# Patient Record
Sex: Female | Born: 1965
Health system: Southern US, Community
[De-identification: ages and names within clinical notes are randomized; demographics above are authoritative.]

## PROBLEM LIST (undated history)

## (undated) ENCOUNTER — Emergency Department (HOSPITAL_COMMUNITY): Payer: Medicaid Other | Source: Home / Self Care

## (undated) DIAGNOSIS — O2230 Deep phlebothrombosis in pregnancy, unspecified trimester: Secondary | ICD-10-CM

## (undated) DIAGNOSIS — G4730 Sleep apnea, unspecified: Secondary | ICD-10-CM

## (undated) DIAGNOSIS — I739 Peripheral vascular disease, unspecified: Secondary | ICD-10-CM

## (undated) DIAGNOSIS — R0600 Dyspnea, unspecified: Secondary | ICD-10-CM

---

## 2016-02-29 ENCOUNTER — Emergency Department (HOSPITAL_COMMUNITY): Payer: Medicaid Other

## 2016-02-29 ENCOUNTER — Inpatient Hospital Stay (HOSPITAL_COMMUNITY)
Admission: EM | Admit: 2016-02-29 | Discharge: 2016-03-08 | DRG: 175 | Disposition: A | Discharge status: Home / Self Care | Payer: Medicaid Other | Attending: Internal Medicine | Admitting: Internal Medicine

## 2016-02-29 ENCOUNTER — Encounter (HOSPITAL_COMMUNITY): Payer: Self-pay | Admitting: *Deleted

## 2016-02-29 DIAGNOSIS — R778 Other specified abnormalities of plasma proteins: Secondary | ICD-10-CM | POA: Diagnosis present

## 2016-02-29 DIAGNOSIS — I2699 Other pulmonary embolism without acute cor pulmonale: Principal | ICD-10-CM | POA: Diagnosis present

## 2016-02-29 DIAGNOSIS — I82401 Acute embolism and thrombosis of unspecified deep veins of right lower extremity: Secondary | ICD-10-CM | POA: Diagnosis present

## 2016-02-29 DIAGNOSIS — Z87891 Personal history of nicotine dependence: Secondary | ICD-10-CM

## 2016-02-29 DIAGNOSIS — D6859 Other primary thrombophilia: Secondary | ICD-10-CM | POA: Diagnosis present

## 2016-02-29 DIAGNOSIS — I824Z1 Acute embolism and thrombosis of unspecified deep veins of right distal lower extremity: Secondary | ICD-10-CM | POA: Diagnosis not present

## 2016-02-29 DIAGNOSIS — J9601 Acute respiratory failure with hypoxia: Secondary | ICD-10-CM | POA: Diagnosis present

## 2016-02-29 DIAGNOSIS — Z6841 Body Mass Index (BMI) 40.0 and over, adult: Secondary | ICD-10-CM | POA: Diagnosis not present

## 2016-02-29 DIAGNOSIS — R0600 Dyspnea, unspecified: Secondary | ICD-10-CM | POA: Diagnosis not present

## 2016-02-29 DIAGNOSIS — I82411 Acute embolism and thrombosis of right femoral vein: Secondary | ICD-10-CM | POA: Diagnosis not present

## 2016-02-29 HISTORY — DX: Morbid (severe) obesity due to excess calories: E66.01

## 2016-02-29 HISTORY — DX: Other pulmonary embolism without acute cor pulmonale: I26.99

## 2016-02-29 LAB — CBC WITH DIFFERENTIAL/PLATELET
BASOS ABS: 0.1 10*3/uL (ref 0.0–0.1)
BASOS PCT: 1 %
EOS ABS: 0.2 10*3/uL (ref 0.0–0.7)
Eosinophils Relative: 3 %
HEMATOCRIT: 46.2 % — AB (ref 36.0–46.0)
HEMOGLOBIN: 14.1 g/dL (ref 12.0–15.0)
Lymphocytes Relative: 37 %
Lymphs Abs: 2.8 10*3/uL (ref 0.7–4.0)
MCH: 27.2 pg (ref 26.0–34.0)
MCHC: 30.5 g/dL (ref 30.0–36.0)
MCV: 89 fL (ref 78.0–100.0)
MONO ABS: 0.7 10*3/uL (ref 0.1–1.0)
Monocytes Relative: 9 %
NEUTROS ABS: 4 10*3/uL (ref 1.7–7.7)
NEUTROS PCT: 50 %
Platelets: 222 10*3/uL (ref 150–400)
RBC: 5.19 MIL/uL — ABNORMAL HIGH (ref 3.87–5.11)
RDW: 15.5 % (ref 11.5–15.5)
WBC: 7.8 10*3/uL (ref 4.0–10.5)

## 2016-02-29 LAB — CBC
HEMATOCRIT: 43.2 % (ref 36.0–46.0)
Hemoglobin: 13.5 g/dL (ref 12.0–15.0)
MCH: 27.5 pg (ref 26.0–34.0)
MCHC: 31.3 g/dL (ref 30.0–36.0)
MCV: 88 fL (ref 78.0–100.0)
PLATELETS: 199 10*3/uL (ref 150–400)
RBC: 4.91 MIL/uL (ref 3.87–5.11)
RDW: 15.3 % (ref 11.5–15.5)
WBC: 7.4 10*3/uL (ref 4.0–10.5)

## 2016-02-29 LAB — I-STAT TROPONIN, ED: TROPONIN I, POC: 0.19 ng/mL — AB (ref 0.00–0.08)

## 2016-02-29 LAB — BASIC METABOLIC PANEL
ANION GAP: 9 (ref 5–15)
BUN: 15 mg/dL (ref 6–20)
CALCIUM: 9.3 mg/dL (ref 8.9–10.3)
CO2: 24 mmol/L (ref 22–32)
Chloride: 105 mmol/L (ref 101–111)
Creatinine, Ser: 1.17 mg/dL — ABNORMAL HIGH (ref 0.44–1.00)
GFR calc non Af Amer: 53 mL/min — ABNORMAL LOW (ref >60)
Glucose, Bld: 97 mg/dL (ref 65–99)
Potassium: 3.8 mmol/L (ref 3.5–5.1)
SODIUM: 138 mmol/L (ref 135–145)

## 2016-02-29 LAB — BRAIN NATRIURETIC PEPTIDE: B NATRIURETIC PEPTIDE 5: 566 pg/mL — AB (ref 0.0–100.0)

## 2016-02-29 LAB — HEPARIN LEVEL (UNFRACTIONATED): HEPARIN UNFRACTIONATED: 0.95 [IU]/mL — AB (ref 0.30–0.70)

## 2016-02-29 LAB — MRSA PCR SCREENING: MRSA BY PCR: NEGATIVE

## 2016-02-29 LAB — D-DIMER, QUANTITATIVE (NOT AT ARMC)

## 2016-02-29 MED ORDER — HEPARIN BOLUS VIA INFUSION
2000.0000 [IU] | Freq: Once | INTRAVENOUS | Status: AC
  Administered 2016-02-29: 2000 [IU] via INTRAVENOUS
  Filled 2016-02-29: qty 2000

## 2016-02-29 MED ORDER — METHYLPREDNISOLONE SODIUM SUCC 125 MG IJ SOLR
125.0000 mg | Freq: Once | INTRAMUSCULAR | Status: AC
  Administered 2016-02-29: 125 mg via INTRAVENOUS
  Filled 2016-02-29: qty 2

## 2016-02-29 MED ORDER — HEPARIN (PORCINE) IN NACL 100-0.45 UNIT/ML-% IJ SOLN: 1000.0000 [IU]/h | INTRAMUSCULAR | Status: DC

## 2016-02-29 MED ORDER — IOPAMIDOL (ISOVUE-370) INJECTION 76%
INTRAVENOUS | Status: AC
  Administered 2016-02-29: 100 mL
  Filled 2016-02-29: qty 100

## 2016-02-29 MED ORDER — HEPARIN SODIUM (PORCINE) 5000 UNIT/ML IJ SOLN
4000.0000 [IU] | Freq: Once | INTRAMUSCULAR | Status: AC
  Administered 2016-02-29: 4000 [IU] via INTRAVENOUS
  Filled 2016-02-29: qty 1

## 2016-02-29 MED ORDER — IPRATROPIUM-ALBUTEROL 0.5-2.5 (3) MG/3ML IN SOLN
3.0000 mL | Freq: Once | RESPIRATORY_TRACT | Status: AC
  Administered 2016-02-29: 3 mL via RESPIRATORY_TRACT
  Filled 2016-02-29: qty 3

## 2016-02-29 MED ORDER — SODIUM CHLORIDE 0.9 % IV SOLN: 250.0000 mL | INTRAVENOUS | Status: DC | PRN

## 2016-02-29 MED ORDER — HEPARIN (PORCINE) IN NACL 100-0.45 UNIT/ML-% IJ SOLN
2050.0000 [IU]/h | INTRAMUSCULAR | Status: DC
  Administered 2016-02-29: 2050 [IU]/h via INTRAVENOUS
  Filled 2016-02-29 (×2): qty 250

## 2016-02-29 MED ORDER — ACETAMINOPHEN 325 MG PO TABS: 650.0000 mg | ORAL_TABLET | ORAL | Status: DC | PRN

## 2016-02-29 MED ORDER — HEPARIN (PORCINE) IN NACL 100-0.45 UNIT/ML-% IJ SOLN
1850.0000 [IU]/h | INTRAMUSCULAR | Status: DC
  Administered 2016-03-01: 1900 [IU]/h via INTRAVENOUS
  Filled 2016-02-29: qty 250

## 2016-02-29 MED ORDER — SODIUM CHLORIDE 0.9 % IV SOLN: INTRAVENOUS | Status: DC

## 2016-02-29 MED ORDER — FUROSEMIDE 10 MG/ML IJ SOLN
40.0000 mg | Freq: Once | INTRAMUSCULAR | Status: AC
  Administered 2016-02-29: 40 mg via INTRAVENOUS
  Filled 2016-02-29: qty 4

## 2016-02-29 MED ORDER — ONDANSETRON HCL 4 MG/2ML IJ SOLN: 4.0000 mg | Freq: Four times a day (QID) | INTRAMUSCULAR | Status: DC | PRN

## 2016-02-29 NOTE — ED Notes (Signed)
Waiting for bed assignment

## 2016-02-29 NOTE — ED Notes (Signed)
Phlebotomy at bedside.

## 2016-02-29 NOTE — Progress Notes (Signed)
ANTICOAGULATION CONSULT NOTE - FOLLOW UP    HL = 0.95 (goal 0.3 - 0.7 units/mL) Heparin dosing weight = 119 kg   Assessment: 50 YOF with bilateral PE started on IV heparin.  Heparin level is supra-therapeutic; no bleeding reported.  RN reported that heparin level was drawn from the same arm as the infusion; however, it is distal to the IV site.   Plan: - Reduce heparin gtt to 1900 units/hr - F/U AM labs    Angel Brewer D. Mina Marble, PharmD, BCPS 02/29/2016, 10:33 PM

## 2016-02-29 NOTE — ED Notes (Signed)
Returned from [employer] and placed back on heart monitor

## 2016-02-29 NOTE — H&P (Signed)
PULMONARY / CRITICAL CARE MEDICINE   Name: Angel Brewer MRN: [id number] DOB: 12/28/1965    ADMISSION DATE:  02/29/2016 CONSULTATION DATE:  02/29/16  REFERRING MD:  Dr. Kathrynn Humble / EDP   CHIEF COMPLAINT:  SOB   HISTORY OF PRESENT ILLNESS:   50 y/o F, non-smoker, with PMH of morbid obesity & prior C-section who presented to [facility] on 8/18 with reports of a one month history of shortness of breath.    The patient reports she has noted progressive shortness of breath over the past month.  Symptoms are worse with lying flat or physical activity - unable to walk from living room to bathroom without significant SOB.  She also has noted a non-productive cough.  Symptoms began after a car ride in July from [place] to [place] (approx 9 hours).  The patient reports intermittent LLE pain for the past few years.  She was previously seen in [place] for pain in her legs and ruled out for DVT.  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  In an effort to relieve shortness of breath, she has used her sisters inhalers over the past few days without relief of symptoms.  She denies chest pain, dizziness, pre-syncope / syncope, asthma, chest pain, pain, pain with inspiration.       Initial ER evaluation notable for hypoxemia with sats of 86% at rest.  Improved to 94% on 4L.  CTA of the chest was evaluated and demonstrated bilateral PE, R>L.  Labs - BNP 566, Na 138, K 3.8, Sr Cr 1.17, troponin of 0.19, WBC 7.8, Hgb 14.1.  EKG without acute changes.    Reports family hx of blood clots (mother) and RA (mother).    PAST MEDICAL HISTORY :  She  has a past medical history of Morbid obesity ([place]).  PAST SURGICAL HISTORY: She  has a past surgical history that includes Cesarean section.  No Known Allergies  No current facility-administered medications on file prior to encounter.    No current outpatient prescriptions on file prior to encounter.    FAMILY HISTORY:  Her has no family status information  on file.    SOCIAL HISTORY: She  reports that she has quit smoking. She has never used smokeless tobacco. She reports that she does not drink alcohol or use drugs.  REVIEW OF SYSTEMS:  POSITIVES IN BOLD Gen: Denies fever, chills, weight change, fatigue, night sweats HEENT: Denies blurred vision, double vision, hearing loss, tinnitus, sinus congestion, rhinorrhea, sore throat, neck stiffness, dysphagia PULM: Denies shortness of breath, non-productive cough, sputum production, hemoptysis, wheezing CV: Denies chest pain, edema, orthopnea, paroxysmal nocturnal dyspnea, palpitations GI: Denies abdominal pain, nausea, vomiting, diarrhea, hematochezia, melena, constipation, change in bowel habits GU: Denies dysuria, hematuria, polyuria, oliguria, urethral discharge Endocrine: Denies hot or cold intolerance, polyuria, polyphagia or appetite change Derm: Denies rash, dry skin, scaling or peeling skin change Heme: Denies easy bruising, bleeding, bleeding gums Neuro: Denies headache, numbness, weakness, slurred speech, loss of memory or consciousness   SUBJECTIVE:  Pt reports feeling better after oxygen administration.   VITAL SIGNS: BP 127/87   Pulse 90   Temp 98 F (36.7 C) (Oral)   Resp (!) 34   Ht _0  (1.803 m)   Wt (!) 426 lb (193.2 kg)   LMP 11/29/2015   SpO2 90%   BMI 59.41 kg/m   HEMODYNAMICS:    VENTILATOR SETTINGS: FiO2 (%):  [55 %] 55 %  INTAKE / OUTPUT:  No intake/output data recorded.  PHYSICAL EXAMINATION: General:  Morbidly obese female in NAD Neuro:  AAOx4, speech clear, MAE  HEENT:  MM pink/moist, short / thick neck  Cardiovascular:  s1s2 rrr, no m/r/g, SR on monitor  Lungs:  Even/non-labored, lungs bilaterally distant but clear  Abdomen:  Obese/soft, bsx4 active  Musculoskeletal:  No acute deformities Skin:  Warm/dry, trace BLE edema   LABS:  BMET  Recent Labs Lab 02/29/16 1212  NA 138  K 3.8  CL 105  CO2 24  BUN 15  CREATININE 1.17*  GLUCOSE  97    Electrolytes  Recent Labs Lab 02/29/16 1212  CALCIUM 9.3    CBC  Recent Labs Lab 02/29/16 1212  WBC 7.8  HGB 14.1  HCT 46.2*  PLT 222    Coag's No results for input(s): APTT, INR in the last 168 hours.  Sepsis Markers No results for input(s): LATICACIDVEN, PROCALCITON, O2SATVEN in the last 168 hours.  ABG No results for input(s): PHART, PCO2ART, PO2ART in the last 168 hours.  Liver Enzymes No results for input(s): AST, ALT, ALKPHOS, BILITOT, ALBUMIN in the last 168 hours.  Cardiac Enzymes No results for input(s): TROPONINI, PROBNP in the last 168 hours.  Glucose No results for input(s): GLUCAP in the last 168 hours.  Imaging Dg Chest 2 View  Result Date: 02/29/2016 CLINICAL DATA:  Shortness of breath for a few days. EXAM: CHEST  2 VIEW COMPARISON:  None. FINDINGS: Cardiac silhouette is enlarged. Prominent right hilar structures which could represent vascular structures. Trachea is midline. No significant pleural effusions. Few linear densities in left lung are suggestive for atelectasis. Negative for a pneumothorax. No acute bone abnormality. IMPRESSION: Cardiomegaly with enlarged central vascular structures. Findings are suggestive for vascular congestion. Fullness in the right hilum and right paratracheal region. These findings could be related to enlarged vascular structures but difficult to exclude lymphadenopathy based on these two views. Recommend follow-up chest radiograph to ensure stability or further characterization with CT. Electronically Signed   By: Markus Daft M.D.   On: 02/29/2016 12:49   Ct Angio Chest Pe W Or Wo Contrast  Result Date: 02/29/2016 CLINICAL DATA:  Shortness of breath. EXAM: CT ANGIOGRAPHY CHEST WITH CONTRAST TECHNIQUE: Multidetector CT imaging of the chest was performed using the standard protocol during bolus administration of intravenous contrast. Multiplanar CT image reconstructions and MIPs were obtained to evaluate the vascular  anatomy. CONTRAST:  100 mL of Isovue 370 COMPARISON:  Chest x-ray from earlier today FINDINGS: The central airways are within normal limits. No pneumothorax. Scattered atelectasis. There is mixed attenuation of lung parenchyma with areas of lower and higher density. No interlobular septal thickening. No suspicious pulmonary nodules or masses. No evidence of pneumonia. Evaluation of pulmonary arteries is somewhat limited due to patient body habitus. However, there are clearly bilateral pulmonary emboli involving both upper lobes, the right middle lobe, and both lower lobes. The largest burden of emboli is in the right lower lobe. The volume of the right ventricle appears to be mildly greater than the left and there is mild bowing of the intraventricular septum to the left such as on series 6, image 207 suggesting right heart strain. Cardiomegaly is identified. No effusions. Evaluation of the thoracic aorta is limited due to timing of the contrast bolus but no aneurysm is identified. There is a mildly prominent node adjacent to the azygos vein measuring 15 mm, possibly reactive. Recommend attention on follow-up. No other adenopathy. Evaluation of the upper abdomen is  limited but normal. Degenerative changes seen in the spine. Review of the MIP images confirms the above findings. IMPRESSION: 1. Bilateral pulmonary emboli most prominent in the right lower lobe with a moderate burden. However, the right ventricular volume is a Weaver greater than the left and there is mild bowing of the intraventricular septum to the left suggesting right heart strain. 2. Probable reactive node.  Recommend attention on follow-up. 3. Mixed attenuation of the lung parenchyma could represent mosaic perfusion versus air trapping. Findings called to Dr. Kathrynn Humble Electronically Signed   By: Dorise Bullion III M.D   On: 02/29/2016 15:03     STUDIES:  CTA Chest 8/18 >> bilateral PE, R>L, bowing of the intraventricular septum to the left  suggestive of R heart strain  SIGNIFICANT EVENTS: 8/18  Admit with PE, R>L   DISCUSSION: 50 y/o, morbidly obese female with no known medical hx who presented to [facility] with SOB.  Work up positive for bilateral PE, R>L with evidence of strain on CT.    ASSESSMENT / PLAN:  PULMONARY A: Bilateral PE - R>L, evidence of intraventricular septal bowing on CT (no ratio given) Acute Hypoxic Respiratory Failure - in the setting of bilateral PE  P:   Heparin gtt for PE  No indication for thrombolytics Oxygen as needed to support saturations > 92% No ambulation until heparin level therapeutic  Trend troponin, BNP Assess ECHO Will need transition to NOAC  Likely will need minimum of 6 months anticoagulation (if not one year)  GASTROINTESTINAL A:   Morbid Obesity  P:   Regular diet as tolerated  PRN zofran   HEMATOLOGIC A:   Bilateral PE - provoked, long car ride in early July P:  Heparin gtt per pharmacy  Trend CBC  Monitor for bleeding  Assess BLE venous duplex for DVT, r/o mobile clot     FAMILY  - Updates: Patient and family updated at bedside 8/18 per Dr. Elsworth Soho  - Inter-disciplinary family meet or Palliative Care meeting due by:  8/25   Transfer to [facility] as of 8/19 am 0700.  PCCM will sign off, please call if new needs arise.    Noe Gens, NP-C [facility] Pgr: [phone] or if no answer [phone] 02/29/2016, 3:23 PM

## 2016-02-29 NOTE — ED Provider Notes (Signed)
[facility] DEPT Provider Note   CSN: [id number] Arrival date & time: 02/29/16  1120     History   Chief Complaint Chief Complaint  Patient presents with  . Shortness of Breath    HPI Angel Brewer is a 50 y.o. female.  HPI   50 year old morbidly obese female without significant past medical history presenting for evaluation of short of breath. Patient report increased shortness of breath for the past month. Her symptoms worsen with ambulating or laying down flat. Now if she walks several steps she gets out of breath. Endorse a chronic nonproductive cough. Also endorsed having wheezing. The symptoms started shortly after patient travel by car from [place] to [place] (9 hrs trip).  She also report intermittent pain to her left leg for the past several years. She denies any prior history of PE or DVT, no recent surgery, prolonged bed rest, active cancer or hemoptysis. Denies any history of asthma but states this is a history of asthma in the family. She did use her sister's inhaler several times in the past several days without any relief. She is not a smoker. She is not having significant cardiac disease. She is currently trying to establish PCP care. She denies having fever, chills, headache, hemoptysis, chest pain, lightheadedness, dizziness, diaphoresis, nausea vomiting diarrhea. She is a nonsmoker. No prior history of congestive heart failure.   History reviewed. No pertinent past medical history.  There are no active problems to display for this patient.   Past Surgical History:  Procedure Laterality Date  . CESAREAN SECTION      OB History    No data available       Home Medications    Prior to Admission medications   Not on File    Family History No family history on file.  Social History Social History  Substance Use Topics  . Smoking status: Former Research scientist (life sciences)  . Smokeless tobacco: Never Used  . Alcohol use No     Allergies   Review of patient's  allergies indicates no known allergies.   Review of Systems Review of Systems  All other systems reviewed and are negative.    Physical Exam Updated Vital Signs BP 136/95 (BP Location: Right Arm)   Pulse 93   Temp 98 F (36.7 C) (Oral)   Resp 22   Ht _0  (1.803 m)   Wt (!) 190.5 kg   LMP 11/29/2015   SpO2 94%   BMI 58.58 kg/m   Physical Exam  Constitutional: She appears well-developed and well-nourished. No distress.  Mobility obese female, sitting upright in mild to moderate respiratory discomfort.  HENT:  Head: Atraumatic.  Right Ear: External ear normal.  Left Ear: External ear normal.  Mouth/Throat: Oropharynx is clear and moist.  Eyes: Conjunctivae are normal.  Neck: Neck supple. No JVD present.  Cardiovascular: Normal rate and regular rhythm.   Pulmonary/Chest:  Patient is tachypneic, breathing with poor effort but no obvious wheezes, rales, rhonchi  Abdominal: Soft. There is no tenderness.  Musculoskeletal: She exhibits no edema.  Large body habitus without any appreciable peripheral edema. Intact pedal pulses.  Bilateral lower extremities without palpable cords  Neurological: She is alert.  Skin: No rash noted.  Psychiatric: She has a normal mood and affect.  Nursing note and vitals reviewed.    ED Treatments / Results  Labs (all labs ordered are listed, but only abnormal results are displayed) Labs Reviewed  BRAIN NATRIURETIC PEPTIDE - Abnormal; Notable for the following:  Result Value   B Natriuretic Peptide 566.0 (*)    All other components within normal limits  CBC WITH DIFFERENTIAL/PLATELET - Abnormal; Notable for the following:    RBC 5.19 (*)    HCT 46.2 (*)    All other components within normal limits  BASIC METABOLIC PANEL - Abnormal; Notable for the following:    Creatinine, Ser 1.17 (*)    GFR calc non Af Amer 53 (*)    All other components within normal limits  D-DIMER, QUANTITATIVE (NOT AT [facility]) - Abnormal; Notable for the  following:    D-Dimer, Quant >20.00 (*)    All other components within normal limits  I-STAT TROPOININ, ED    EKG  EKG Interpretation  Date/Time:  Friday February 29 2016 11:34:14 EDT Ventricular Rate:  94 PR Interval:  168 QRS Duration: 80 QT Interval:  362 QTC Calculation: 452 R Axis:   89 Text Interpretation:  Normal sinus rhythm Cannot rule out Anterior infarct , age undetermined Abnormal ECG No acute changes No old tracing to compare Confirmed by Kathrynn Humble, MD, Thelma Comp [id number]) on 02/29/2016 11:37:17 AM       Radiology Dg Chest 2 View  Result Date: 02/29/2016 CLINICAL DATA:  Shortness of breath for a few days. EXAM: CHEST  2 VIEW COMPARISON:  None. FINDINGS: Cardiac silhouette is enlarged. Prominent right hilar structures which could represent vascular structures. Trachea is midline. No significant pleural effusions. Few linear densities in left lung are suggestive for atelectasis. Negative for a pneumothorax. No acute bone abnormality. IMPRESSION: Cardiomegaly with enlarged central vascular structures. Findings are suggestive for vascular congestion. Fullness in the right hilum and right paratracheal region. These findings could be related to enlarged vascular structures but difficult to exclude lymphadenopathy based on these two views. Recommend follow-up chest radiograph to ensure stability or further characterization with CT. Electronically Signed   By: Markus Daft M.D.   On: 02/29/2016 12:49   Ct Angio Chest Pe W Or Wo Contrast  Result Date: 02/29/2016 CLINICAL DATA:  Shortness of breath. EXAM: CT ANGIOGRAPHY CHEST WITH CONTRAST TECHNIQUE: Multidetector CT imaging of the chest was performed using the standard protocol during bolus administration of intravenous contrast. Multiplanar CT image reconstructions and MIPs were obtained to evaluate the vascular anatomy. CONTRAST:  100 mL of Isovue 370 COMPARISON:  Chest x-ray from earlier today FINDINGS: The central airways are within normal  limits. No pneumothorax. Scattered atelectasis. There is mixed attenuation of lung parenchyma with areas of lower and higher density. No interlobular septal thickening. No suspicious pulmonary nodules or masses. No evidence of pneumonia. Evaluation of pulmonary arteries is somewhat limited due to patient body habitus. However, there are clearly bilateral pulmonary emboli involving both upper lobes, the right middle lobe, and both lower lobes. The largest burden of emboli is in the right lower lobe. The volume of the right ventricle appears to be mildly greater than the left and there is mild bowing of the intraventricular septum to the left such as on series 6, image 207 suggesting right heart strain. Cardiomegaly is identified. No effusions. Evaluation of the thoracic aorta is limited due to timing of the contrast bolus but no aneurysm is identified. There is a mildly prominent node adjacent to the azygos vein measuring 15 mm, possibly reactive. Recommend attention on follow-up. No other adenopathy. Evaluation of the upper abdomen is limited but normal. Degenerative changes seen in the spine. Review of the MIP images confirms the above findings. IMPRESSION: 1. Bilateral pulmonary emboli most prominent  in the right lower lobe with a moderate burden. However, the right ventricular volume is a [place] greater than the left and there is mild bowing of the intraventricular septum to the left suggesting right heart strain. 2. Probable reactive node.  Recommend attention on follow-up. 3. Mixed attenuation of the lung parenchyma could represent mosaic perfusion versus air trapping. Findings called to Dr. Kathrynn Humble Electronically Signed   By: Dorise Bullion III M.D   On: 02/29/2016 15:03    Procedures Procedures (including critical care time)  Medications Ordered in ED Medications  heparin injection 4,000 Units (not administered)  ipratropium-albuterol (DUONEB) 0.5-2.5 (3) MG/3ML nebulizer solution 3 mL (3 mLs  Nebulization Given 02/29/16 1212)  methylPREDNISolone sodium succinate (SOLU-MEDROL) 125 mg/2 mL injection 125 mg (125 mg Intravenous Given 02/29/16 1212)  furosemide (LASIX) injection 40 mg (40 mg Intravenous Given 02/29/16 1347)  iopamidol (ISOVUE-370) 76 % injection (100 mLs  Contrast Given 02/29/16 1430)     Initial Impression / Assessment and Plan / ED Course  I have reviewed the triage vital signs and the nursing notes.  Pertinent labs & imaging results that were available during my care of the patient were reviewed by me and considered in my medical decision making (see chart for details).  Clinical Course    BP 127/87   Pulse 90   Temp 98 F (36.7 C) (Oral)   Resp (!) 34   Ht _0  (1.803 m)   Wt (!) 193.2 kg   LMP 11/29/2015   SpO2 90%   BMI 59.41 kg/m    Final Clinical Impressions(s) / ED Diagnoses   Final diagnoses:  Pulmonary embolism, bilateral (HCC)    New Prescriptions New Prescriptions   No medications on file   12:11 PM Patient presents with progressive worsening shortness of breath and wheezing. Suspect bronchospasms, possible underlying diagnosed asthma. She is in mild respiratory discomfort in the ER, sats at 85% on room air. No overt wheezing on exam. No fever or productive cough to suggest pneumonia. Will provide breathing treatment. She is also morbidly obese, did report a long travel, sitting in a car for more than 9 hours a month ago prior to her presenting complaint. Unable to use PERC criteria to rule out PE, therefore d-dimer ordered.  2:10 PM Patient has a markedly elevated d-dimer of 20. Will order chest CT angiogram to rule out PE. Elevated BNP of 566. Chest x-ray with signs of vascular congestion but without evidence of pulmonary edema. Lasix 40 mg IV given. Patient continues to endorse shortness of  breath despite receiving breathing treatment. Her O2 sats is 88-92% on 3 L of oxygen, will switch to venti mask. Her blood pressure stable. Her  creatinine is 1.17. No leukocytosis. Care discussed with Dr. Kathrynn Humble.    3:12 PM Chest CT angiogram demonstrates submassive bilateral PE with evidence of right heart strain.  This may reflect elevated BNP.  Will obtain Troponin and will consult intensivist for admission.  Heparin started per pharmacy consult.    3:16 PM Appreciate consultation from PCCM intensivist Dr. Elsworth Soho who agrees to admit pt to step down.  Holding order placed. Pt made aware of plan and agrees with plan.    CRITICAL CARE Performed by: Domenic Moras Total critical care time: 45 minutes Critical care time was exclusive of separately billable procedures and treating other patients. Critical care was necessary to treat or prevent imminent or life-threatening deterioration. Critical care was time spent personally by me on the following activities: development of  treatment plan with patient and/or surrogate as well as nursing, discussions with consultants, evaluation of patient's response to treatment, examination of patient, obtaining history from patient or surrogate, ordering and performing treatments and interventions, ordering and review of laboratory studies, ordering and review of radiographic studies, pulse oximetry and re-evaluation of patient's condition.    Domenic Moras, PA-C 02/29/16 1610    Varney Biles, MD 03/01/16 1427

## 2016-02-29 NOTE — ED Notes (Signed)
Patient continues to have episodes of oxygen dropping to 3L [place], er md notifed, respiratory at bedside to begin administering oxygen via Venti mask, patients oxygen is now 90% resting in bed, will continue to monitor

## 2016-02-29 NOTE — ED Triage Notes (Signed)
PT here for sob since Wed accompanied by cough.  Denies chest pain or dizziness.

## 2016-02-29 NOTE — Progress Notes (Signed)
ANTICOAGULATION CONSULT NOTE - Initial Consult  Pharmacy Consult for heparin Indication: pulmonary embolus  No Known Allergies  Patient Measurements: Height: _0  (180.3 cm) Weight: (!) 426 lb (193.2 kg) IBW/kg (Calculated) : 70.8 Heparin Dosing Weight: 119kg  Vital Signs: Temp: 98 F (36.7 C) (08/18 1129) Temp Source: Oral (08/18 1129) BP: 127/87 (08/18 1445) Pulse Rate: 90 (08/18 1445)  Labs:  Recent Labs  02/29/16 1212  HGB 14.1  HCT 46.2*  PLT 222  CREATININE 1.17*    Estimated Creatinine Clearance: 108.8 mL/min (by C-G formula based on SCr of 1.17 mg/dL).   Assessment: 8 YOF here with one month history SOB- symptoms worse with physical activity or lying flat. D-Dimer significantly elevated, CT angio pos for bilateral PE- no evidence of heart strain.  Hgb and plts wnl- no bleeding noted at baseline. No anticoag PTA. Already received a 4000 unit bolus in the ED   Goal of Therapy:  Heparin level 0.3-0.7 units/ml Monitor platelets by anticoagulation protocol: Yes   Plan:  -heparin bolus with an additional 2000 units, then start infusion at 2050 units/hr -heparin level in 6h -daily HL and CBC -follow for long term AC plans  Pasha Gadison D. Larua Collier, PharmD, BCPS Clinical Pharmacist Pager: [phone] 02/29/2016 3:48 PM

## 2016-02-29 NOTE — ED Notes (Signed)
At rest patients oxygen remains at 93% and above, when patient moves around in bed oxygen drops to 86% on 3L [place], er md notified and decision made to not ambulate patient, patient resting in bed on 4L [place] oxygen 94%, is not in apparent distress, will continue to monitor

## 2016-02-29 NOTE — ED Notes (Signed)
Bed side toilet at bedside for patient

## 2016-02-29 NOTE — ED Notes (Signed)
MD at bedside. 

## 2016-03-01 ENCOUNTER — Inpatient Hospital Stay (HOSPITAL_COMMUNITY): Payer: Medicaid Other

## 2016-03-01 DIAGNOSIS — I2699 Other pulmonary embolism without acute cor pulmonale: Secondary | ICD-10-CM

## 2016-03-01 LAB — COMPREHENSIVE METABOLIC PANEL
ALBUMIN: 3.2 g/dL — AB (ref 3.5–5.0)
ALT: 12 U/L — ABNORMAL LOW (ref 14–54)
ANION GAP: 6 (ref 5–15)
AST: 16 U/L (ref 15–41)
Alkaline Phosphatase: 54 U/L (ref 38–126)
BUN: 13 mg/dL (ref 6–20)
CHLORIDE: 104 mmol/L (ref 101–111)
CO2: 28 mmol/L (ref 22–32)
Calcium: 9.2 mg/dL (ref 8.9–10.3)
Creatinine, Ser: 1.08 mg/dL — ABNORMAL HIGH (ref 0.44–1.00)
GFR calc Af Amer: >60 mL/min (ref >60)
GFR calc non Af Amer: 59 mL/min — ABNORMAL LOW (ref >60)
GLUCOSE: 133 mg/dL — AB (ref 65–99)
POTASSIUM: 4.7 mmol/L (ref 3.5–5.1)
SODIUM: 138 mmol/L (ref 135–145)
Total Bilirubin: 0.5 mg/dL (ref 0.3–1.2)
Total Protein: 7.4 g/dL (ref 6.5–8.1)

## 2016-03-01 LAB — CBC
HCT: 41.6 % (ref 36.0–46.0)
HEMOGLOBIN: 12.9 g/dL (ref 12.0–15.0)
MCH: 27.5 pg (ref 26.0–34.0)
MCHC: 31 g/dL (ref 30.0–36.0)
MCV: 88.7 fL (ref 78.0–100.0)
Platelets: 201 10*3/uL (ref 150–400)
RBC: 4.69 MIL/uL (ref 3.87–5.11)
RDW: 15.4 % (ref 11.5–15.5)
WBC: 6.4 10*3/uL (ref 4.0–10.5)

## 2016-03-01 LAB — HEPARIN LEVEL (UNFRACTIONATED)
HEPARIN UNFRACTIONATED: 0.72 [IU]/mL — AB (ref 0.30–0.70)
HEPARIN UNFRACTIONATED: 0.78 [IU]/mL — AB (ref 0.30–0.70)
Heparin Unfractionated: 0.71 IU/mL — ABNORMAL HIGH (ref 0.30–0.70)
Heparin Unfractionated: 0.23 IU/mL — ABNORMAL LOW (ref 0.30–0.70)

## 2016-03-01 LAB — BRAIN NATRIURETIC PEPTIDE: B NATRIURETIC PEPTIDE 5: 700.4 pg/mL — AB (ref 0.0–100.0)

## 2016-03-01 MED ORDER — HEPARIN (PORCINE) IN NACL 100-0.45 UNIT/ML-% IJ SOLN
2000.0000 [IU]/h | INTRAMUSCULAR | Status: DC
  Administered 2016-03-01: 2000 [IU]/h via INTRAVENOUS
  Administered 2016-03-02 (×2): 1850 [IU]/h via INTRAVENOUS
  Administered 2016-03-03 – 2016-03-04 (×3): 1750 [IU]/h via INTRAVENOUS
  Administered 2016-03-05: 2250 [IU]/h via INTRAVENOUS
  Administered 2016-03-05: 1950 [IU]/h via INTRAVENOUS
  Administered 2016-03-06 (×2): 2250 [IU]/h via INTRAVENOUS
  Administered 2016-03-07: 2150 [IU]/h via INTRAVENOUS
  Administered 2016-03-07: 2250 [IU]/h via INTRAVENOUS
  Administered 2016-03-08: 2150 [IU]/h via INTRAVENOUS
  Filled 2016-03-01 (×13): qty 250

## 2016-03-01 NOTE — Progress Notes (Signed)
[place] TEAM 1 - [place]  [id number]:[id number] DOB: [birth year] DOA: 02/29/2016 PCP: No primary care provider on file.    Brief Narrative:  50 y/o F non-smoker with Hx of morbid obesity & prior C-section who presented to [facility] on 8/18 with reports of a one month history of shortness of breath.  Symptoms began after a car ride in July from [place] to [place] (approx 9 hours).  The patient reported intermittent L LE pain for the past few years.      Initial ER evaluation was notable for hypoxemia with sats of 86% at rest.  Improved to 94% on 4L.  CTA of the chest demonstrated bilateral PE, R>L.     Reports family hx of blood clots (mother).  Subjective: The patient is resting comfortably in bed.  She denies chest pain nausea vomiting or abdominal pain.  She does report some ongoing shortness of breath.  She also remembers now that she has had some numbness and tingling down her right leg for at least a month or 2.  She denies loss of strength in the leg.  Assessment & Plan:  Bilateral PE - R>L w/ Acute Hypoxic Respiratory Failure evidence of intraventricular septal bowing on CT - Heparin gtt - no indication for thrombolytics - assess ECHO - transition to NOAC - will need minimum of 6 months anticoagulation (if not one year) - check Dopplers bilateral lower extremities with symptoms and right lower extremity suggestive of DVT - ask case manager to investigate options regarding insurance coverage of oral anticoagulants  Morbid Obesity - Body mass index is 62.15 kg/m.   DVT prophylaxis: IV heparin  Code Status: FULL CODE Family Communication: no family present at time of exam  Disposition Plan: SDU until venous duplex and TTE completed   Consultants:  PCCM  Procedures: TTE - pending  Venous duplex - pending   Antimicrobials:  None   Objective: Blood pressure (!) 107/27, pulse 72, temperature 98.7 F (37.1 C), temperature source Axillary, resp. rate 18, height _0   (1.753 m), weight (!) 190.9 kg (420 lb 13.7 oz), last menstrual period 11/29/2015, SpO2 97 %.  Intake/Output Summary (Last 24 hours) at 03/01/16 1431 Last data filed at 03/01/16 1400  Gross per 24 hour  Intake          1011.59 ml  Output              550 ml  Net           461.59 ml   Filed Weights   02/29/16 1404 02/29/16 1733 03/01/16 0800  Weight: (!) 193.2 kg (426 lb) (!) 192.8 kg (425 lb) (!) 190.9 kg (420 lb 13.7 oz)    Examination: General: No acute respiratory distress Lungs: Clear to auscultation bilaterally without wheezes or crackles Cardiovascular: Regular rate and rhythm without murmur gallop or rub normal S1 and S2 Abdomen: Nontender, morbidly obese, soft, bowel sounds positive, no rebound, no ascites, no appreciable mass Extremities: No significant cyanosis, clubbing, or edema bilateral lower extremities  CBC:  Recent Labs Lab 02/29/16 1212 02/29/16 1909 03/01/16 0255  WBC 7.8 7.4 6.4  NEUTROABS 4.0  --   --   HGB 14.1 13.5 12.9  HCT 46.2* 43.2 41.6  MCV 89.0 88.0 88.7  PLT 222 199 967   Basic Metabolic Panel:  Recent Labs Lab 02/29/16 1212 03/01/16 0255  NA 138 138  K 3.8 4.7  CL 105 104  CO2 24 28  GLUCOSE 97 133*  BUN 15 13  CREATININE 1.17* 1.08*  CALCIUM 9.3 9.2   GFR: Estimated Creatinine Clearance: 114.2 mL/min (by C-G formula based on SCr of 1.08 mg/dL).  Liver Function Tests:  Recent Labs Lab 03/01/16 0255  AST 16  ALT 12*  ALKPHOS 54  BILITOT 0.5  PROT 7.4  ALBUMIN 3.2*    Recent Results (from the past 240 hour(s))  MRSA PCR Screening     Status: None   Collection Time: 02/29/16  5:37 PM  Result Value Ref Range Status   MRSA by PCR NEGATIVE NEGATIVE Final    Comment:        The GeneXpert MRSA Assay (FDA approved for NASAL specimens only), is one component of a comprehensive MRSA colonization surveillance program. It is not intended to diagnose MRSA infection nor to guide or monitor treatment for MRSA infections.       Scheduled Meds:  Continuous Infusions: . sodium chloride    . heparin 1,850 Units/hr (03/01/16 0900)     LOS: 1 day   Cherene Altes, MD [facility] Office  [phone] Pager - Text Page per Amion as per below:  On-Call/Text Page:      [email or web address]      password [email or web address]  If 7PM-7AM, please contact night-coverage [email or web address] Password [facility] 03/01/2016, 2:31 PM

## 2016-03-01 NOTE — Progress Notes (Signed)
VASCULAR LAB PRELIMINARY  PRELIMINARY  PRELIMINARY  PRELIMINARY  Bilateral lower extremity venous duplex completed.    Preliminary report:  There is subacute DVT noted in the right popliteal vein.   Study was technically limited and difficult secondary to body habitus.  Dolores Ewing, RVT 03/01/2016, 7:20 PM

## 2016-03-01 NOTE — Progress Notes (Signed)
ANTICOAGULATION CONSULT NOTE - Follow Up Consult  Pharmacy Consult for Heparin  Indication: pulmonary embolus, new onset  No Known Allergies  Patient Measurements: Height: _0  (175.3 cm) Weight: (!) 425 lb (192.8 kg) IBW/kg (Calculated) : 66.2  Vital Signs: Temp: 98.3 F (36.8 C) (08/19 0402) Temp Source: Oral (08/19 0402) BP: 125/82 (08/19 0402) Pulse Rate: 63 (08/19 0402)  Labs:  Recent Labs  02/29/16 1212 02/29/16 1909 02/29/16 2157 03/01/16 0255  HGB 14.1 13.5  --  12.9  HCT 46.2* 43.2  --  41.6  PLT 222 199  --  201  HEPARINUNFRC  --   --  0.95* 0.72*  CREATININE 1.17*  --   --  1.08*    Estimated Creatinine Clearance: 114.9 mL/min (by C-G formula based on SCr of 1.08 mg/dL).  Assessment: Heparin level remains slightly above goal after rate decrease  Goal of Therapy:  Heparin level 0.3-0.7 units/ml Monitor platelets by anticoagulation protocol: Yes   Plan:  -Decrease heparin slightly to 1850 units/hr -1200 HL  Narda Bonds 03/01/2016,4:07 AM

## 2016-03-01 NOTE — Progress Notes (Signed)
ANTICOAGULATION CONSULT NOTE - Initial Consult  Pharmacy Consult for heparin Indication: pulmonary embolus  No Known Allergies  Patient Measurements: Height: _0  (175.3 cm) Weight: (!) 420 lb 13.7 oz (190.9 kg) IBW/kg (Calculated) : 66.2 Heparin Dosing Weight: 119kg  Vital Signs: Temp: 98.7 F (37.1 C) (08/19 1229) Temp Source: Axillary (08/19 1229) BP: 107/27 (08/19 1200) Pulse Rate: 72 (08/19 1200)  Labs:  Recent Labs  02/29/16 1212 02/29/16 1909 02/29/16 2157 03/01/16 0255 03/01/16 1236  HGB 14.1 13.5  --  12.9  --   HCT 46.2* 43.2  --  41.6  --   PLT 222 199  --  201  --   HEPARINUNFRC  --   --  0.95* 0.72* 0.23*  CREATININE 1.17*  --   --  1.08*  --     Estimated Creatinine Clearance: 114.2 mL/min (by C-G formula based on SCr of 1.08 mg/dL).   Assessment: 21 YOF here with one month history SOB- symptoms worse with physical activity or lying flat. D-Dimer significantly elevated, CT angio pos for bilateral PE- no evidence of heart strain.  HL is now SUBtherapeutic after multiple rate decreases. Per nurse, gtt interrupted for 5 mins to change site with possible infiltrate. CBC remains wnl and stable with no reported s/s bleeding.    Goal of Therapy:  Heparin level 0.3-0.7 units/ml Monitor platelets by anticoagulation protocol: Yes   Plan:  -Increase heparin gtt to 2000 units/hr -heparin level in 6h -daily HL and CBC -follow for long term [facility] plans  Henry K. Velva Harman, PharmD, BCPS, CPP Clinical Pharmacist Pager: [phone] Phone: [phone] 03/01/2016 2:55 PM

## 2016-03-02 ENCOUNTER — Inpatient Hospital Stay (HOSPITAL_COMMUNITY): Payer: Medicaid Other

## 2016-03-02 DIAGNOSIS — I2699 Other pulmonary embolism without acute cor pulmonale: Secondary | ICD-10-CM

## 2016-03-02 DIAGNOSIS — Z6841 Body Mass Index (BMI) 40.0 and over, adult: Secondary | ICD-10-CM

## 2016-03-02 DIAGNOSIS — I82411 Acute embolism and thrombosis of right femoral vein: Secondary | ICD-10-CM

## 2016-03-02 LAB — CBC
HCT: 44.2 % (ref 36.0–46.0)
Hemoglobin: 13.1 g/dL (ref 12.0–15.0)
MCH: 27.3 pg (ref 26.0–34.0)
MCHC: 29.6 g/dL — AB (ref 30.0–36.0)
MCV: 92.3 fL (ref 78.0–100.0)
PLATELETS: 166 10*3/uL (ref 150–400)
RBC: 4.79 MIL/uL (ref 3.87–5.11)
RDW: 15.7 % — AB (ref 11.5–15.5)
WBC: 5.7 10*3/uL (ref 4.0–10.5)

## 2016-03-02 LAB — ECHOCARDIOGRAM COMPLETE
Height: 69 in
Weight: 6740.78 oz

## 2016-03-02 LAB — HEPARIN LEVEL (UNFRACTIONATED)
HEPARIN UNFRACTIONATED: 0.49 [IU]/mL (ref 0.30–0.70)
Heparin Unfractionated: 0.75 IU/mL — ABNORMAL HIGH (ref 0.30–0.70)

## 2016-03-02 MED ORDER — PERFLUTREN LIPID MICROSPHERE
1.0000 mL | INTRAVENOUS | Status: DC | PRN
  Administered 2016-03-02: 3 mL via INTRAVENOUS
  Filled 2016-03-02 (×2): qty 10

## 2016-03-02 MED ORDER — CETYLPYRIDINIUM CHLORIDE 0.05 % MT LIQD
7.0000 mL | Freq: Two times a day (BID) | OROMUCOSAL | Status: DC
  Administered 2016-03-02 – 2016-03-07 (×10): 7 mL via OROMUCOSAL

## 2016-03-02 NOTE — Progress Notes (Signed)
[place] TEAM 1 - [place]  YQM:[id number] DOB: 1966/06/04 DOA: 02/29/2016 PCP: No primary care provider on file.    Brief Narrative:  50 y/o F non-smoker with Hx of morbid obesity & prior C-section who presented to [facility] on 8/18 with reports of a one month history of shortness of breath.  Symptoms began after a car ride in July from [place] to [place] (approx 9 hours).  The patient reported intermittent L LE pain for the past few years.      Initial ER evaluation was notable for hypoxemia with sats of 86% at rest.  Improved to 94% on 4L.  CTA of the chest demonstrated bilateral PE, R>L.     Reports family hx of blood clots (mother).  Subjective: The patient is resting comfortably in bed with no chest pain or shortness of breath.  She has not yet been up/ambulated.  Assessment & Plan:  Bilateral PE - R>L w/ Acute Hypoxic Respiratory Failure evidence of intraventricular septal bowing on CT - Heparin gtt - no indication for thrombolytics - TTE pending - transition to NOAC once clear what options are regarding insurance payment - will need minimum of 6 months anticoagulation (if not one year) - Doppler confirms R LE DVT but not able to provide much detail due to poor images - Case Manager to investigate options regarding insurance coverage of oral anticoagulants  Morbid Obesity - Body mass index is 62.21 kg/m.   DVT prophylaxis: IV heparin  Code Status: FULL CODE Family Communication: no family present at time of exam  Disposition Plan: transfer to tele bed - PT/OT - await transition to oral anticoag   Consultants:  PCCM  Procedures: TTE - pending  Venous duplex - R LE DVT   Antimicrobials:  None   Objective: Blood pressure (!) 104/45, pulse 62, temperature 97.3 F (36.3 C), temperature source Oral, resp. rate 13, height _0  (1.753 m), weight (!) 191.1 kg (421 lb 4.8 oz), last menstrual period 11/29/2015, SpO2 97 %.  Intake/Output Summary (Last 24 hours)  at 03/02/16 1426 Last data filed at 03/02/16 1400  Gross per 24 hour  Intake           1161.2 ml  Output              650 ml  Net            511.2 ml   Filed Weights   02/29/16 1733 03/01/16 0800 03/02/16 0534  Weight: (!) 192.8 kg (425 lb) (!) 190.9 kg (420 lb 13.7 oz) (!) 191.1 kg (421 lb 4.8 oz)    Examination: General: No acute respiratory distress - alert  Lungs: Clear to auscultation bilaterally without wheezes or crackles Cardiovascular: Regular rate and rhythm without murmur gallop or rub  Abdomen: Nontender, morbidly obese, soft, bowel sounds positive, no rebound, no ascites, no appreciable mass Extremities: No significant cyanosis, clubbing, edema bilateral lower extremities  CBC:  Recent Labs Lab 02/29/16 1212 02/29/16 1909 03/01/16 0255 03/02/16 1004  WBC 7.8 7.4 6.4 5.7  NEUTROABS 4.0  --   --   --   HGB 14.1 13.5 12.9 13.1  HCT 46.2* 43.2 41.6 44.2  MCV 89.0 88.0 88.7 92.3  PLT 222 199 201 528   Basic Metabolic Panel:  Recent Labs Lab 02/29/16 1212 03/01/16 0255  NA 138 138  K 3.8 4.7  CL 105 104  CO2 24 28  GLUCOSE 97 133*  BUN 15 13  CREATININE 1.17* 1.08*  CALCIUM 9.3 9.2   GFR: Estimated Creatinine Clearance: 114.3 mL/min (by C-G formula based on SCr of 1.08 mg/dL).  Liver Function Tests:  Recent Labs Lab 03/01/16 0255  AST 16  ALT 12*  ALKPHOS 54  BILITOT 0.5  PROT 7.4  ALBUMIN 3.2*    Recent Results (from the past 240 hour(s))  MRSA PCR Screening     Status: None   Collection Time: 02/29/16  5:37 PM  Result Value Ref Range Status   MRSA by PCR NEGATIVE NEGATIVE Final    Comment:        The GeneXpert MRSA Assay (FDA approved for NASAL specimens only), is one component of a comprehensive MRSA colonization surveillance program. It is not intended to diagnose MRSA infection nor to guide or monitor treatment for MRSA infections.      Scheduled Meds: . antiseptic oral rinse  7 mL Mouth Rinse BID   Continuous  Infusions: . sodium chloride    . heparin 1,850 Units/hr (03/02/16 0800)     LOS: 2 days   Cherene Altes, MD [facility] Office  [phone] Pager - Text Page per Amion as per below:  On-Call/Text Page:      [email or web address]      password [email or web address]  If 7PM-7AM, please contact night-coverage [email or web address] Password [email or web address] 03/02/2016, 2:26 PM

## 2016-03-02 NOTE — Progress Notes (Signed)
  Echocardiogram 2D Echocardiogram has been performed.  Angel Brewer 03/02/2016, 10:02 AM

## 2016-03-02 NOTE — Progress Notes (Signed)
[place] for heparin Indication: pulmonary embolus  No Known Allergies  Patient Measurements: Height: _0  (175.3 cm) Weight: (!) 420 lb 13.7 oz (190.9 kg) IBW/kg (Calculated) : 66.2 Heparin Dosing Weight: 119kg  Vital Signs: Temp: 97.4 F (36.3 C) (08/19 2200) Temp Source: Oral (08/19 2200) BP: 98/69 (08/19 2200) Pulse Rate: 64 (08/19 2200)  Labs:  Recent Labs  02/29/16 1212 02/29/16 1909  03/01/16 0255 03/01/16 1236 03/01/16 2303  HGB 14.1 13.5  --  12.9  --   --   HCT 46.2* 43.2  --  41.6  --   --   PLT 222 199  --  201  --   --   HEPARINUNFRC  --   --   < > 0.72* 0.23* 0.78*  CREATININE 1.17*  --   --  1.08*  --   --   < > = values in this interval not displayed.  Estimated Creatinine Clearance: 114.2 mL/min (by C-G formula based on SCr of 1.08 mg/dL).   Assessment: 50 y.o. female with PE for heparin  Goal of Therapy:  Heparin level 0.3-0.7 units/ml Monitor platelets by anticoagulation protocol: Yes   Plan:  Decrease Heparin 1850 units/hr Follow-up am labs.  Phillis Knack, PharmD, BCPS   03/02/2016 12:41 AM

## 2016-03-02 NOTE — Progress Notes (Signed)
Order for transfer acknowledged, Rm 3E 30 available. Report called to [place]. Pt informed and verbalized  Understanding. Pt's belongings packed, CCMD and Elink both notified. Pt transported to new room via bed with belongings.

## 2016-03-02 NOTE — Progress Notes (Signed)
ANTICOAGULATION CONSULT NOTE - Follow Up Consult  Pharmacy Consult for Heparin Indication: pulmonary embolus  No Known Allergies  Patient Measurements: Height: _0  (175.3 cm) Weight: (!) 421 lb 4.8 oz (191.1 kg) IBW/kg (Calculated) : 66.2 Heparin Dosing Weight: 115.8 kg   Vital Signs: Temp: 97.3 F (36.3 C) (08/20 1241) Temp Source: Oral (08/20 1241) BP: 104/45 (08/20 1241) Pulse Rate: 62 (08/20 1241)  Labs:  Recent Labs  02/29/16 1212 02/29/16 1909  03/01/16 0255  03/01/16 2303 03/02/16 1004 03/02/16 1814  HGB 14.1 13.5  --  12.9  --   --  13.1  --   HCT 46.2* 43.2  --  41.6  --   --  44.2  --   PLT 222 199  --  201  --   --  166  --   HEPARINUNFRC  --   --   < > 0.72*  < > 0.78* 0.49 0.75*  CREATININE 1.17*  --   --  1.08*  --   --   --   --   < > = values in this interval not displayed.  Estimated Creatinine Clearance: 114.3 mL/min (by C-G formula based on SCr of 1.08 mg/dL).   Medications:  Heparin gtt @ 1850 units/hr  Assessment: 50 yo f here with one month history SOB - symptoms worse with physical activity or lying flat. D-Dimer significantly elevated, CT angio pos for bilateral PE- no evidence of heart strain.  HL is now supratherapeutic this evening at 0.75 on 1850 units/hr. No issues per RN.  Goal of Therapy:  Heparin level 0.3-0.7 units/ml Monitor platelets by anticoagulation protocol: Yes   Plan:  - Decrease heparin gtt to 1750 units/hr - 6-hr HL @ 0100 - Daily HL, CBC - Monitor for s/s bleeding - F/u long-term AC plan   Mora Pedraza L. Nicole Kindred, PharmD Clinical Pharmacist Pager: [phone] 03/02/2016 7:00 PM

## 2016-03-02 NOTE — Progress Notes (Signed)
ANTICOAGULATION CONSULT NOTE - Follow Up Consult  Pharmacy Consult for Heparin Indication: pulmonary embolus  No Known Allergies  Patient Measurements: Height: 5' 9" (175.3 cm) Weight: (!) 421 lb 4.8 oz (191.1 kg) IBW/kg (Calculated) : 66.2 Heparin Dosing Weight: 115.8 kg   Vital Signs: Temp: 98 F (36.7 C) (08/20 0755) Temp Source: Oral (08/20 0755) BP: 95/40 (08/20 1139) Pulse Rate: 65 (08/20 0755)  Labs:  Recent Labs  02/29/16 1212 02/29/16 1909  03/01/16 0255 03/01/16 1236 03/01/16 2303 03/02/16 1004  HGB 14.1 13.5  --  12.9  --   --  13.1  HCT 46.2* 43.2  --  41.6  --   --  44.2  PLT 222 199  --  201  --   --  166  HEPARINUNFRC  --   --   < > 0.72* 0.23* 0.78* 0.49  CREATININE 1.17*  --   --  1.08*  --   --   --   < > = values in this interval not displayed.  Estimated Creatinine Clearance: 114.3 mL/min (by C-G formula based on SCr of 1.08 mg/dL).   Medications:  Heparin gtt @ 1850 units/hr  Assessment: 73 YOF here with one month history SOB- symptoms worse with physical activity or lying flat. D-Dimer significantly elevated, CT angio pos for bilateral PE- no evidence of heart strain.  HL is now therapeutic x 1. CBC remains wnl and stable with no reported s/s bleeding.   Goal of Therapy:  Heparin level 0.3-0.7 units/ml Monitor platelets by anticoagulation protocol: Yes   Plan:  - Continue heparin gtt at 1850 units/hr - 6 hour HL to confirm - Daily HL, CBC - Monitor for s/s bleeding - F/u long-term AC plan   Nettye Flegal K. Velva Harman, PharmD, BCPS, CPP Clinical Pharmacist Pager: [phone] Phone: [phone] 03/02/2016 11:55 AM

## 2016-03-03 LAB — CBC
HEMATOCRIT: 41.6 % (ref 36.0–46.0)
HEMOGLOBIN: 12.4 g/dL (ref 12.0–15.0)
MCH: 27.3 pg (ref 26.0–34.0)
MCHC: 29.8 g/dL — ABNORMAL LOW (ref 30.0–36.0)
MCV: 91.6 fL (ref 78.0–100.0)
Platelets: 183 10*3/uL (ref 150–400)
RBC: 4.54 MIL/uL (ref 3.87–5.11)
RDW: 15.4 % (ref 11.5–15.5)
WBC: 4.2 10*3/uL (ref 4.0–10.5)

## 2016-03-03 LAB — HEPARIN LEVEL (UNFRACTIONATED)
Heparin Unfractionated: 0.58 IU/mL (ref 0.30–0.70)
Heparin Unfractionated: 0.61 IU/mL (ref 0.30–0.70)

## 2016-03-03 MED ORDER — WARFARIN SODIUM 7.5 MG PO TABS
7.5000 mg | ORAL_TABLET | Freq: Once | ORAL | Status: AC
  Administered 2016-03-03: 7.5 mg via ORAL
  Filled 2016-03-03: qty 1

## 2016-03-03 MED ORDER — OXYMETAZOLINE HCL 0.05 % NA SOLN
1.0000 | Freq: Two times a day (BID) | NASAL | Status: DC | PRN
  Administered 2016-03-03: 1 via NASAL
  Filled 2016-03-03: qty 15

## 2016-03-03 MED ORDER — WARFARIN - PHARMACIST DOSING INPATIENT
Freq: Every day | Status: DC
  Administered 2016-03-03 – 2016-03-05 (×3)

## 2016-03-03 NOTE — Progress Notes (Signed)
[facility] called to verify apt; the apt is for he [place] to get her medication on 03/05/2016; they informed CM that they cannot see her until October; Will attempt to make apt at the [facility] for follow up care. Mindi Slicker [facility] [phone]

## 2016-03-03 NOTE — Care Management Note (Signed)
Case Management Note  Patient Details  Name: Angel Brewer MRN: [id number] Date of Birth: Jun 15, 1966  Subjective/Objective:  Admitted with Bil PE                  Action/Plan: Patient moved from [place] to [facility] Dec 2016; Her niece is the primary caregiver/ assist as needed; She is a new patient with the [facility]; pt stated that she has an apt 03/05/2016; No medical insurance, she was denied for Medicaid; she goes to [facility] for her medication. Patient is to be discharged on NOAC- CM will give the patient a coupon card for a free 30 day supply of medication and pt to follow up with the drug company medication assistance program for ongoing medication.  Expected Discharge Date:    possibly 03/04/2016              Expected Discharge Plan:  Home/Self Care  In-House Referral:   [facility]  Discharge planning Services  CM Consult   Status of Service:  In process, will continue to follow  Sherrilyn Rist [phone] 03/03/2016, 12:05 PM

## 2016-03-03 NOTE — Progress Notes (Signed)
[place] TEAM 1 - [place]  VXB:[id number] DOB: 1965-12-05 DOA: 02/29/2016 PCP: No primary care provider on file.    Brief Narrative:  50 y/o F non-smoker with Hx of morbid obesity & prior C-section who presented to [facility] on 8/18 with reports of a one month history of shortness of breath.  Symptoms began after a car ride in July from [place] to [place] (approx 9 hours).  The patient reported intermittent L LE pain for the past few years.      Initial ER evaluation was notable for hypoxemia with sats of 86% at rest.  Improved to 94% on 4L.  CTA of the chest demonstrated bilateral PE, R>L.     Reports family hx of blood clots (mother).  Subjective: The patient reports no new complaints today.  She is sitting up in a bedside chair.  She ambulated some with therapy today and did feel quite dyspneic.  She denies chest pain nausea vomiting or abdominal pain.  Assessment & Plan:  Bilateral PE - R>L w/ Acute Hypoxic Respiratory Failure evidence of intraventricular septal bowing on CT - Heparin gtt - no indication for thrombolytics - TTE notes only moderate reduction in RV systolic function - will need minimum of 6 months anticoagulation (if not one year) - Doppler confirms R LE DVT but not able to provide much detail due to poor images - desired transition to NOAC but patient's lack of insurance/financial concerns prohibit this - discussed use of Coumadin with her and she agrees - initiate Coumadin today - discharge home when INR at goal/overlap with IV heparin completed - patient will need to be established with a PCP for INR monitoring prior to discharge and she informs me she is taking care of this  Morbid Obesity - Body mass index is 62.41 kg/m.   DVT prophylaxis: IV heparin  Code Status: FULL CODE Family Communication: no family present at time of exam  Disposition Plan: PT/OT - await transition to oral anticoag   Consultants:  PCCM  Procedures: TTE - EF 50-55  percent - no WMA - LA moderately dilated - RV moderately dilated with systolic function moderately reduced  Venous duplex - R LE DVT   Antimicrobials:  None   Objective: Blood pressure (!) 141/80, pulse 64, temperature 97.3 F (36.3 C), temperature source Oral, resp. rate 20, height _0  (1.753 m), weight (!) 191.7 kg (422 lb 9.6 oz), last menstrual period 11/29/2015, SpO2 100 %.  Intake/Output Summary (Last 24 hours) at 03/03/16 1725 Last data filed at 03/03/16 1234  Gross per 24 hour  Intake          1016.95 ml  Output              800 ml  Net           216.95 ml   Filed Weights   03/01/16 0800 03/02/16 0534 03/03/16 0412  Weight: (!) 190.9 kg (420 lb 13.7 oz) (!) 191.1 kg (421 lb 4.8 oz) (!) 191.7 kg (422 lb 9.6 oz)    Examination: General: No acute respiratory distress  Lungs: Clear to auscultation bilaterally - Distant breath sounds throughout due to body habitus Cardiovascular: Regular rate and rhythm without murmur  Abdomen: Nontender, morbidly obese, soft, bowel sounds positive Extremities: No significant cyanosis, clubbing, or edema bilateral lower extremities  CBC:  Recent Labs Lab 02/29/16 1212 02/29/16 1909 03/01/16 0255 03/02/16 1004 03/03/16 0840  WBC 7.8 7.4 6.4 5.7 4.2  NEUTROABS 4.0  --   --   --   --  HGB 14.1 13.5 12.9 13.1 12.4  HCT 46.2* 43.2 41.6 44.2 41.6  MCV 89.0 88.0 88.7 92.3 91.6  PLT 222 199 201 166 372   Basic Metabolic Panel:  Recent Labs Lab 02/29/16 1212 03/01/16 0255  NA 138 138  K 3.8 4.7  CL 105 104  CO2 24 28  GLUCOSE 97 133*  BUN 15 13  CREATININE 1.17* 1.08*  CALCIUM 9.3 9.2   GFR: Estimated Creatinine Clearance: 114.5 mL/min (by C-G formula based on SCr of 1.08 mg/dL).  Liver Function Tests:  Recent Labs Lab 03/01/16 0255  AST 16  ALT 12*  ALKPHOS 54  BILITOT 0.5  PROT 7.4  ALBUMIN 3.2*    Recent Results (from the past 240 hour(s))  MRSA PCR Screening     Status: None   Collection Time: 02/29/16   5:37 PM  Result Value Ref Range Status   MRSA by PCR NEGATIVE NEGATIVE Final    Comment:        The GeneXpert MRSA Assay (FDA approved for NASAL specimens only), is one component of a comprehensive MRSA colonization surveillance program. It is not intended to diagnose MRSA infection nor to guide or monitor treatment for MRSA infections.      Scheduled Meds: . antiseptic oral rinse  7 mL Mouth Rinse BID   Continuous Infusions: . sodium chloride    . heparin 1,750 Units/hr (03/03/16 1032)     LOS: 3 days   Cherene Altes, MD [facility] Office  [phone] Pager - Text Page per Amion as per below:  On-Call/Text Page:      [email or web address]      password [email or web address]  If 7PM-7AM, please contact night-coverage [email or web address] Password [email or web address] 03/03/2016, 5:25 PM

## 2016-03-03 NOTE — Progress Notes (Signed)
ANTICOAGULATION CONSULT NOTE - Follow Up Consult  Pharmacy Consult for Heparin  Indication: pulmonary embolus, new onset  No Known Allergies  Patient Measurements: Height: 5' 9" (175.3 cm) Weight: (!) 422 lb 9.6 oz (191.7 kg) (b scale) IBW/kg (Calculated) : 66.2  Vital Signs: Temp: 97.8 F (36.6 C) (08/21 0819) Temp Source: Oral (08/21 0819) BP: 119/73 (08/21 0819) Pulse Rate: 68 (08/21 0819)  Labs:  Recent Labs  02/29/16 1212  03/01/16 0255  03/02/16 1004 03/02/16 1814 03/03/16 0112 03/03/16 0840  HGB 14.1  < > 12.9  --  13.1  --   --  12.4  HCT 46.2*  < > 41.6  --  44.2  --   --  41.6  PLT 222  < > 201  --  166  --   --  183  HEPARINUNFRC  --   < > 0.72*  < > 0.49 0.75* 0.58 0.61  CREATININE 1.17*  --  1.08*  --   --   --   --   --   < > = values in this interval not displayed.  Estimated Creatinine Clearance: 114.5 mL/min (by C-G formula based on SCr of 1.08 mg/dL).  Assessment: Continues on heparin for bilateral PE. No anticoag PTA. HL remains therapeutic at 0.61 after rate decrease yesterday evening. CBC WNL and stable. No bleeding reported.   Goal of Therapy:  Heparin level 0.3-0.7 units/ml Monitor platelets by anticoagulation protocol: Yes   Plan:  -Continue heparin gtt at 1750 units/hr -Daily HL and CBC -Monitor s/sx bleeding -F/U long term [facility] plans   Stephens November, PharmD Clinical Pharmacist 10:26 AM, 03/03/2016

## 2016-03-03 NOTE — Clinical Social Work Note (Signed)
CSW acknowledges consult for patient not having a PCP. Notified RNCM.  CSW signing off. Consult again if any social work needs arise.  Dayton Scrape, [place]

## 2016-03-03 NOTE — Clinical Social Work Note (Signed)
Patient and daughter requested to see CSW. Patient in need of letter stating that she is unable to sign off on dental procedure that her youngest daughter needs to have this week. The dentist office was requesting this. Surveyor, quantity read and approved letter which stated that she was in the hospital for medical reasons and unable to sign paperwork for procedure. Letter given directly to patient and her older daughter.  CSW signing off. Consult again if social work needs arise.  Angel Brewer, [place]

## 2016-03-03 NOTE — Progress Notes (Signed)
ANTICOAGULATION CONSULT NOTE - Follow Up Consult  Pharmacy Consult for Heparin  Indication: pulmonary embolus, new onset  No Known Allergies  Patient Measurements: Height: _0  (175.3 cm) Weight: (!) 422 lb 9.6 oz (191.7 kg) (b scale) IBW/kg (Calculated) : 66.2  Vital Signs: Temp: 97.3 F (36.3 C) (08/21 1232) Temp Source: Oral (08/21 1232) BP: 141/80 (08/21 1232) Pulse Rate: 64 (08/21 1232)  Labs:  Recent Labs  03/01/16 0255  03/02/16 1004 03/02/16 1814 03/03/16 0112 03/03/16 0840  HGB 12.9  --  13.1  --   --  12.4  HCT 41.6  --  44.2  --   --  41.6  PLT 201  --  166  --   --  183  HEPARINUNFRC 0.72*  < > 0.49 0.75* 0.58 0.61  CREATININE 1.08*  --   --   --   --   --   < > = values in this interval not displayed.  Estimated Creatinine Clearance: 114.5 mL/min (by C-G formula based on SCr of 1.08 mg/dL).  Assessment: 6 YOF currently on IV heparin for bilateral PE. No anticoag PTA. Now to start coumadin. No baseline PT/INR, LFTs wnl.   Goal of Therapy:  INR 2-3 Heparin level 0.3-0.7 units/ml Monitor platelets by anticoagulation protocol: Yes   Plan:  -Coumadin 7.5 mg -Continue heparin gtt at 1750 units/hr -Daily HL and CBC, INR -Monitor s/sx bleeding   Maryanna Shape, PharmD, BCPS  Clinical Pharmacist  Pager: [phone]   5:50 PM, 03/03/2016

## 2016-03-03 NOTE — Evaluation (Signed)
Physical Therapy Evaluation Patient Details Name: Ravenne Wayment MRN: [id number] DOB: Jan 15, 1966 Today's Date: 03/03/2016   History of Present Illness  50 y.o. female admitted with bilateral PE with hypoxic respiratory failure. PMH: morbid obesity.  Clinical Impression  Pt able to ambulate 200 ft with min guard assistance and no assistive device. SpO2 97% prior to ambulation and 94% following. PT to continue to follow to progress mobility and independence in anticipation of D/C to home with family support.     Follow Up Recommendations No PT follow up;Supervision for mobility/OOB    Equipment Recommendations  None recommended by PT    Recommendations for Other Services       Precautions / Restrictions Precautions Precautions: Fall Restrictions Weight Bearing Restrictions: No      Mobility  Bed Mobility               General bed mobility comments: Pt sitting in chair upon arrival  Transfers Overall transfer level: Needs assistance Equipment used: None Transfers: Sit to/from Stand Sit to Stand: Supervision         General transfer comment: supervision for safety  Ambulation/Gait Ambulation/Gait assistance: Min guard Ambulation Distance (Feet): 200 Feet Assistive device: None Gait Pattern/deviations: Step-through pattern;Wide base of support;Decreased step length - right;Decreased step length - left Gait velocity: decreased    General Gait Details: Pt using 2LO2  throughout session, mild instability but no loss of balance. SpO2 94% after ambulation.   Stairs            Wheelchair Mobility    Modified Rankin (Stroke Patients Only)       Balance Overall balance assessment: Needs assistance Sitting-balance support: No upper extremity supported Sitting balance-[name]: Good     Standing balance support: No upper extremity supported Standing balance-[name]: Fair                               Pertinent Vitals/Pain Pain  Assessment: No/denies pain    Home Living Family/patient expects to be discharged to:: Private residence Living Arrangements: Other relatives Available Help at Discharge: Family;Available 24 hours/day Type of Home: House Home Access: Level entry     Home Layout: One level Home Equipment: None Additional Comments: Lives with niece who is able to assist as needed.     Prior Function Level of Independence: Independent               Hand Dominance        Extremity/Trunk Assessment   Upper Extremity Assessment: Overall WFL for tasks assessed           Lower Extremity Assessment: Overall WFL for tasks assessed         Communication   Communication: No difficulties  Cognition Arousal/Alertness: Awake/alert Behavior During Therapy: WFL for tasks assessed/performed Overall Cognitive Status: Within Functional Limits for tasks assessed                      General Comments      Exercises        Assessment/Plan    PT Assessment Patient needs continued PT services  PT Diagnosis Difficulty walking   PT Problem List Decreased activity tolerance;Decreased balance;Decreased mobility  PT Treatment Interventions Gait training;Stair training;Functional mobility training;Therapeutic activities;Therapeutic exercise;Balance training;Patient/family education   PT Goals (Current goals can be found in the Care Plan section) Acute Rehab PT Goals Patient Stated Goal: get back home PT Goal Formulation:  With patient Time For Goal Achievement: 03/17/16 Potential to Achieve Goals: Good    Frequency Min 3X/week   Barriers to discharge        Co-evaluation               End of Session Equipment Utilized During Treatment: Oxygen Activity Tolerance: Patient tolerated treatment well Patient left: in chair;with call bell/phone within reach;with family/visitor present Nurse Communication: Mobility status         Time: 1341-1400 PT Time Calculation (min)  (ACUTE ONLY): 19 min   Charges:   PT Evaluation $PT Eval Moderate Complexity: 1 Procedure     PT G Codes:        Cassell Clement, PT, CSCS Pager [phone] Office [phone]  03/03/2016, 2:45 PM

## 2016-03-03 NOTE — Progress Notes (Signed)
ANTICOAGULATION CONSULT NOTE - Follow Up Consult  Pharmacy Consult for Heparin  Indication: pulmonary embolus, new onset  No Known Allergies  Patient Measurements: Height: _0  (175.3 cm) Weight: (!) 421 lb 4.8 oz (191.1 kg) IBW/kg (Calculated) : 66.2  Vital Signs: Temp: 97.7 F (36.5 C) (08/20 2245) Temp Source: Oral (08/20 2245) BP: 94/43 (08/20 2245) Pulse Rate: 67 (08/20 2245)  Labs:  Recent Labs  02/29/16 1212 02/29/16 1909  03/01/16 0255  03/02/16 1004 03/02/16 1814 03/03/16 0112  HGB 14.1 13.5  --  12.9  --  13.1  --   --   HCT 46.2* 43.2  --  41.6  --  44.2  --   --   PLT 222 199  --  201  --  166  --   --   HEPARINUNFRC  --   --   < > 0.72*  < > 0.49 0.75* 0.58  CREATININE 1.17*  --   --  1.08*  --   --   --   --   < > = values in this interval not displayed.  Estimated Creatinine Clearance: 114.3 mL/min (by C-G formula based on SCr of 1.08 mg/dL).  Assessment: Heparin level therapeutic x 1 after rate decrease  Goal of Therapy:  Heparin level 0.3-0.7 units/ml Monitor platelets by anticoagulation protocol: Yes   Plan:  -Cont heparin 1750 units/hr -HL with AM labs  Narda Bonds 03/03/2016,1:43 AM

## 2016-03-04 DIAGNOSIS — R0600 Dyspnea, unspecified: Secondary | ICD-10-CM

## 2016-03-04 LAB — CBC
HEMATOCRIT: 42.5 % (ref 36.0–46.0)
HEMOGLOBIN: 12.9 g/dL (ref 12.0–15.0)
MCH: 27.2 pg (ref 26.0–34.0)
MCHC: 30.4 g/dL (ref 30.0–36.0)
MCV: 89.7 fL (ref 78.0–100.0)
Platelets: 165 10*3/uL (ref 150–400)
RBC: 4.74 MIL/uL (ref 3.87–5.11)
RDW: 14.9 % (ref 11.5–15.5)
WBC: 5.3 10*3/uL (ref 4.0–10.5)

## 2016-03-04 LAB — HEPARIN LEVEL (UNFRACTIONATED): HEPARIN UNFRACTIONATED: 0.41 [IU]/mL (ref 0.30–0.70)

## 2016-03-04 LAB — PROTIME-INR
INR: 1.09
PROTHROMBIN TIME: 14.2 s (ref 11.4–15.2)

## 2016-03-04 MED ORDER — WARFARIN SODIUM 7.5 MG PO TABS
7.5000 mg | ORAL_TABLET | Freq: Once | ORAL | Status: AC
  Administered 2016-03-04: 7.5 mg via ORAL
  Filled 2016-03-04: qty 1

## 2016-03-04 NOTE — Progress Notes (Signed)
ANTICOAGULATION CONSULT NOTE - Follow Up Consult  Pharmacy Consult for Heparin  Indication: pulmonary embolus, new onset  No Known Allergies  Patient Measurements: Height: _0  (175.3 cm) Weight: (!) 422 lb 1.6 oz (191.5 kg) IBW/kg (Calculated) : 66.2  Vital Signs: Temp: 98 F (36.7 C) (08/22 0831) Temp Source: Oral (08/22 0831) BP: 136/81 (08/22 0831) Pulse Rate: 65 (08/22 0831)  Labs:  Recent Labs  03/02/16 1004  03/03/16 0112 03/03/16 0840 03/04/16 0341 03/04/16 0957  HGB 13.1  --   --  12.4 12.9  --   HCT 44.2  --   --  41.6 42.5  --   PLT 166  --   --  183 165  --   LABPROT  --   --   --   --   --  14.2  INR  --   --   --   --   --  1.09  HEPARINUNFRC 0.49  < > 0.58 0.61 0.41  --   < > = values in this interval not displayed.  Estimated Creatinine Clearance: 114.4 mL/min (by C-G formula based on SCr of 1.08 mg/dL).  Assessment: Continues on heparin for bilateral PE - warfarin added 8/21. HL remains therapeutic at 0.41. INR today 1.09 after initial 7.21m dose of warfarin yesterday. CBC WNL and stable. No bleeding reported.  Goal of Therapy:  INR 2-3 Heparin level 0.3-0.7 units/ml Monitor platelets by anticoagulation protocol: Yes   Plan:  -Continue heparin at 1750 units/hr -Warfarin 7.547mx1 -Daily HL, INR, CBC -Monitor s/sx bleeding   RoStephens NovemberPharmD Clinical Pharmacist 10:52 AM, 03/04/2016

## 2016-03-04 NOTE — Progress Notes (Addendum)
Occupational Therapy Evaluation Patient Details Name: Angel Brewer MRN: [id number] DOB: [birth year] Today's Date: 03/04/2016    History of Present Illness 50 y.o. female admitted with bilateral PE with hypoxic respiratory failure. PMH: morbid obesity.   Clinical Impression   PTA, pt mod I with ADL and mobility. Pt states ADL tasks are difficult due to her body habitus. Ambulated @ 200 ft on RA with O2 sats 97-100 and HR 74-82. No complaints of SOB. Will educate pt on use of AE and compensatory techniques to increase ability to complete her self care. Will also educate on energy conservation techniques. Pt very appreciative.     Follow Up Recommendations  No OT follow up;Supervision - Intermittent    Equipment Recommendations  Tub/shower seat ([employer] (pt over 400#))    Recommendations for Other Services       Precautions / Restrictions Precautions Precautions: Other (comment) (watch O2 Sats) Restrictions Weight Bearing Restrictions: No      Mobility Bed Mobility               General bed mobility comments: Pt sitting in chair upon arrival  Transfers Overall transfer level: Modified independent                    Balance     Sitting balance-[name]: Normal       Standing balance-[name]: Good                              ADL Overall ADL's : Needs assistance/impaired             Lower Body Bathing: Moderate assistance Lower Body Bathing Details (indicate cue type and reason): A with feet, lower legs, bottom     Lower Body Dressing: Sit to/from stand;Moderate assistance Lower Body Dressing Details (indicate cue type and reason): difficulty with socks/shoes/underwear over feet Toilet Transfer: [place] and Hygiene: Sit to/from stand;Moderate assistance Toileting - Clothing Manipulation Details (indicate cue type and reason): Pt has difficulty with hygiene after toileting due to  body habitus.     Functional mobility during ADLs: Modified independent General ADL Comments: Will benefit from AE and energy conservaiton     Vision     Perception     Praxis      Pertinent Vitals/Pain Pain Assessment: No/denies pain     Hand Dominance Right   Extremity/Trunk Assessment Upper Extremity Assessment Upper Extremity Assessment: Overall WFL for tasks assessed   Lower Extremity Assessment Lower Extremity Assessment: Overall WFL for tasks assessed   Cervical / Trunk Assessment Cervical / Trunk Assessment: Normal   Communication Communication Communication: No difficulties   Cognition Arousal/Alertness: Awake/alert Behavior During Therapy: WFL for tasks assessed/performed Overall Cognitive Status: Within Functional Limits for tasks assessed                     General Comments       Exercises       Shoulder Instructions      Home Living Family/patient expects to be discharged to:: Private residence Living Arrangements: Other relatives Available Help at Discharge: Family;Available 24 hours/day Type of Home: House Home Access: Level entry     Home Layout: One level     Bathroom Shower/Tub: Occupational psychologist: Handicapped height Bathroom Accessibility: Yes How Accessible: Accessible via walker Home Equipment: None   Additional Comments: Lives with niece who is able  to assist as needed.       Prior Functioning/Environment Level of Independence: Independent             OT Diagnosis: Generalized weakness   OT Problem List: Decreased activity tolerance;Decreased knowledge of use of DME or AE;Cardiopulmonary status limiting activity;Obesity   OT Treatment/Interventions: Self-care/ADL training;Energy conservation;DME and/or AE instruction;Therapeutic activities;Patient/family education    OT Goals(Current goals can be found in the care plan section) Acute Rehab OT Goals Patient Stated Goal: to be able to do my  self care easier OT Goal Formulation: With patient Time For Goal Achievement: 03/11/16 Potential to Achieve Goals: Good  OT Frequency: Min 3X/week   Barriers to D/C:            Co-evaluation              End of Session Nurse Communication: Mobility status  Activity Tolerance: Patient tolerated treatment well Patient left: in chair;with call bell/phone within reach   Time: 1155-1223 OT Time Calculation (min): 28 min Charges:  OT General Charges $OT Visit: 1 Procedure OT Evaluation $OT Eval Moderate Complexity: 1 Procedure OT Treatments $Self Care/Home Management : 8-22 mins G-Codes:    Kiandra Sanguinetti,H[place] 03/15/16, 12:29 PM  Maurie Boettcher, OT/L  [phone] 03-15-16

## 2016-03-04 NOTE — Progress Notes (Signed)
[place] TEAM 1 - Charlo  YME:[id number] DOB: 06/23/1966 DOA: 02/29/2016 PCP: No primary care provider on file.    Brief Narrative:  50 y/o F non-smoker with Hx of morbid obesity & prior C-section who presented to [facility] on 8/18 with reports of a one month history of shortness of breath.  Symptoms began after a car ride in July from [place] to [place] (approx 9 hours).  The patient reported intermittent L LE pain for the past few years.      Initial ER evaluation was notable for hypoxemia with sats of 86% at rest.  Improved to 94% on 4L.  CTA of the chest demonstrated bilateral PE, R>L.     Reports family hx of blood clots (mother).  Subjective: The patient reports no new complaints today.  She is sitting up in a bedside chair.  She ambulated some with therapy today and did feel quite dyspneic.  She denies chest pain nausea vomiting or abdominal pain.  She was noted to have acute pulmonary emboli.   Assessment & Plan:  Bilateral PE - R>L w/ Acute Hypoxic Respiratory Failure evidence of intraventricular septal bowing on CT - Heparin gtt - no indication for thrombolytics - TTE notes only moderate reduction in RV systolic function - will need minimum of 6 months anticoagulation (if not one year) - Doppler confirms R LE DVT but not able to provide much detail due to poor images - desired transition to NOAC but patient's lack of insurance/financial concerns prohibit this - discussed use of Coumadin with her and she agrees - initiate Coumadin (started 8/21)- discharge home when INR at goal/overlap with IV heparin completed - patient will need to be established with a PCP for INR monitoring prior to discharge and she informs me she is taking care of this.  I have asked care management to assist with getting her into the [facility] for PT/INR tests and primary care follow up.  They are working on it. She does not have insurance and needs to get the orange card for assistance.  For now will  continue current management.  Appreciate pharmacy team assistance with anticoagulation.    Morbid Obesity - Body mass index is 62.33 kg/m.   DVT prophylaxis: IV heparin / warfarin Code Status: FULL CODE Family Communication: no family present at time of exam  Disposition Plan: PT/OT pending  Consultants:  PCCM  Procedures: TTE - EF 50-55 percent - no WMA - LA moderately dilated - RV moderately dilated with systolic function moderately reduced  Venous duplex - R LE DVT   Antimicrobials:  None   Objective: Blood pressure 136/81, pulse 65, temperature 98 F (36.7 C), temperature source Oral, resp. rate 18, height _0  (1.753 m), weight (!) 191.5 kg (422 lb 1.6 oz), last menstrual period 11/29/2015, SpO2 100 %.  Intake/Output Summary (Last 24 hours) at 03/04/16 1229 Last data filed at 03/04/16 0900  Gross per 24 hour  Intake            722.5 ml  Output              300 ml  Net            422.5 ml   Filed Weights   03/02/16 0534 03/03/16 0412 03/04/16 0613  Weight: (!) 191.1 kg (421 lb 4.8 oz) (!) 191.7 kg (422 lb 9.6 oz) (!) 191.5 kg (422 lb 1.6 oz)    Examination: General: No acute respiratory distress  Lungs: Clear  to auscultation bilaterally - Distant breath sounds throughout due to body habitus Cardiovascular: Regular rate and rhythm without murmur  Abdomen: Nontender, morbidly obese, soft, bowel sounds positive Extremities: No significant cyanosis, clubbing, or edema bilateral lower extremities  CBC:  Recent Labs Lab 02/29/16 1212 02/29/16 1909 03/01/16 0255 03/02/16 1004 03/03/16 0840 03/04/16 0341  WBC 7.8 7.4 6.4 5.7 4.2 5.3  NEUTROABS 4.0  --   --   --   --   --   HGB 14.1 13.5 12.9 13.1 12.4 12.9  HCT 46.2* 43.2 41.6 44.2 41.6 42.5  MCV 89.0 88.0 88.7 92.3 91.6 89.7  PLT 222 199 201 166 183 588   Basic Metabolic Panel:  Recent Labs Lab 02/29/16 1212 03/01/16 0255  NA 138 138  K 3.8 4.7  CL 105 104  CO2 24 28  GLUCOSE 97 133*  BUN 15 13    CREATININE 1.17* 1.08*  CALCIUM 9.3 9.2   GFR: Estimated Creatinine Clearance: 114.4 mL/min (by C-G formula based on SCr of 1.08 mg/dL).  Liver Function Tests:  Recent Labs Lab 03/01/16 0255  AST 16  ALT 12*  ALKPHOS 54  BILITOT 0.5  PROT 7.4  ALBUMIN 3.2*    Recent Results (from the past 240 hour(s))  MRSA PCR Screening     Status: None   Collection Time: 02/29/16  5:37 PM  Result Value Ref Range Status   MRSA by PCR NEGATIVE NEGATIVE Final    Comment:        The GeneXpert MRSA Assay (FDA approved for NASAL specimens only), is one component of a comprehensive MRSA colonization surveillance program. It is not intended to diagnose MRSA infection nor to guide or monitor treatment for MRSA infections.      Scheduled Meds: . antiseptic oral rinse  7 mL Mouth Rinse BID  . warfarin  7.5 mg Oral ONCE-1800  . Warfarin - Pharmacist Dosing Inpatient   Does not apply q1800   Continuous Infusions: . sodium chloride    . heparin 1,750 Units/hr (03/04/16 0115)     LOS: 4 days   Murvin Natal, MD [facility] Office  [phone] Pager - Text Page per Shea Evans as per below:  On-Call/Text Page:      [email or web address]      password [email or web address]  If 7PM-7AM, please contact night-coverage [email or web address] Password [facility] 03/04/2016, 12:29 PM

## 2016-03-04 NOTE — Progress Notes (Signed)
Physical Therapy Discharge Patient Details Name: Angel Brewer MRN: [id number] DOB: 1965-07-22 Today's Date: 03/04/2016 Time:  -     Patient discharged from PT services secondary to goals met and no further PT needs identified.  Please see latest therapy progress note for current level of functioning and progress toward goals.  (See also OT note from earlier today. Patient denies any further difficulties with ambulation. Has no concerns re: balance. She does want to continue with OT re: increased independence with ADLs)  Progress and discharge plan discussed with patient and/or caregiver: Patient/Caregiver agrees with plan  [name]  Pager [phone]  03/04/2016, 5:07 PM

## 2016-03-05 LAB — CBC
HCT: 42 % (ref 36.0–46.0)
Hemoglobin: 13 g/dL (ref 12.0–15.0)
MCH: 27.4 pg (ref 26.0–34.0)
MCHC: 31 g/dL (ref 30.0–36.0)
MCV: 88.6 fL (ref 78.0–100.0)
PLATELETS: 170 10*3/uL (ref 150–400)
RBC: 4.74 MIL/uL (ref 3.87–5.11)
RDW: 15 % (ref 11.5–15.5)
WBC: 4.6 10*3/uL (ref 4.0–10.5)

## 2016-03-05 LAB — PROTIME-INR
INR: 1.16
Prothrombin Time: 14.8 seconds (ref 11.4–15.2)

## 2016-03-05 LAB — HEPARIN LEVEL (UNFRACTIONATED)
Heparin Unfractionated: 0.28 IU/mL — ABNORMAL LOW (ref 0.30–0.70)
Heparin Unfractionated: 0.27 IU/mL — ABNORMAL LOW (ref 0.30–0.70)
Heparin Unfractionated: 0.51 IU/mL (ref 0.30–0.70)

## 2016-03-05 MED ORDER — WARFARIN SODIUM 10 MG PO TABS
10.0000 mg | ORAL_TABLET | Freq: Once | ORAL | Status: AC
  Administered 2016-03-05: 10 mg via ORAL
  Filled 2016-03-05: qty 1

## 2016-03-05 NOTE — Progress Notes (Signed)
ANTICOAGULATION CONSULT NOTE - Follow Up Consult  Pharmacy Consult for Heparin and Warfarin Indication: pulmonary embolus, new onset  No Known Allergies  Patient Measurements: Height: _0  (175.3 cm) Weight: (!) 421 lb (191 kg) (scale b) IBW/kg (Calculated) : 66.2  Heparin dosing wt: 116 kg  Vital Signs: Temp: 98 F (36.7 C) (08/23 0830) Temp Source: Oral (08/23 0830) BP: 113/64 (08/23 0830) Pulse Rate: 83 (08/23 0830)  Labs:  Recent Labs  03/03/16 0840 03/04/16 0341 03/04/16 0957 03/05/16 0422 03/05/16 1147  HGB 12.4 12.9  --  13.0  --   HCT 41.6 42.5  --  42.0  --   PLT 183 165  --  170  --   LABPROT  --   --  14.2 14.8  --   INR  --   --  1.09 1.16  --   HEPARINUNFRC 0.61 0.41  --  0.28* 0.27*    Estimated Creatinine Clearance: 114.2 mL/min (by C-G formula based on SCr of 1.08 mg/dL).  Assessment: Continues on heparin to warfarin bridge for bilateral PE. Heparin level down to slightly subtherapeutic (0.27) on 1950 units/hr. No bleeding noted, CBC is stable. No line issues per RN.  Today is day 3 of heparin to Coumadin bridge. Bridge to continue a minimum of 5 days AND until INR >= 2 for 24 hours.  Goal of Therapy:  INR 2-3 Heparin level 0.3-0.7 units/ml Monitor platelets by anticoagulation protocol: Yes   Plan:  -Increase heparin drip to 2250 units/hr -6 hr HL -Daily HL, INR, CBC -Monitor s/sx bleeding  Renold Genta, PharmD, BCPS Clinical Pharmacist Phone for today - [place] - [phone] 03/05/2016 1:03 PM

## 2016-03-05 NOTE — Progress Notes (Signed)
Occupational Therapy Treatment Patient Details Name: Angel Brewer MRN: [id number] DOB: [birth year] Today's Date: 03/05/2016    History of present illness 50 y.o. female admitted with bilateral PE with hypoxic respiratory failure. PMH: morbid obesity.   OT comments  Patient progressing towards OT goals. Session focused on energy conservation education and AE education. Continue OT per plan of care.   Follow Up Recommendations  No OT follow up;Supervision - Intermittent    Equipment Recommendations  Tub/shower seat ([employer] (pt over 400#))    Recommendations for Other Services      Precautions / Restrictions Precautions Precautions: Fall Restrictions Weight Bearing Restrictions: No       Mobility Bed Mobility                  Transfers Overall transfer level: Modified independent Equipment used: None Transfers: Sit to/from Stand Sit to Stand: Modified independent (Device/Increase time)              Balance                                   ADL Overall ADL's : Needs assistance/impaired             Lower Body Bathing: Minimal assistance;With adaptive equipment Lower Body Bathing Details (indicate cue type and reason): simulated with long sponge     Lower Body Dressing: Minimal assistance;With adaptive equipment;Sit to/from stand Lower Body Dressing Details (indicate cue type and reason): practiced doffing/donning socks with reacher and sock aide. Verbal review of how to don underwear and pants with reacher -- pt declined to practice             Functional mobility during ADLs: Modified independent General ADL Comments: Patient received up in recliner. Issued reacher, long sponge, sock aide and patient practiced using after education on their uses. Patient also given energy conservation techniques handout. Reviewed in detail with patient. She verbalized understanding. OT will continue to follow.      Vision                     Perception     Praxis      Cognition   Behavior During Therapy: WFL for tasks assessed/performed Overall Cognitive Status: Within Functional Limits for tasks assessed                       Extremity/Trunk Assessment               Exercises     Shoulder Instructions       General Comments      Pertinent Vitals/ Pain       Pain Assessment: No/denies pain  Home Living                                          Prior Functioning/Environment              Frequency Min 3X/week     Progress Toward Goals  OT Goals(current goals can now be found in the care plan section)  Progress towards OT goals: Progressing toward goals  Acute Rehab OT Goals Patient Stated Goal: to be able to do my self care easier  Plan Discharge plan remains appropriate    Co-evaluation  End of Session     Activity Tolerance Patient tolerated treatment well   Patient Left in chair;with call bell/phone within reach   Nurse Communication          Time: 6378-5885 OT Time Calculation (min): 19 min  Charges: OT General Charges $OT Visit: 1 Procedure OT Treatments $Self Care/Home Management : 8-22 mins  Sahory Nordling A 03/05/2016, 12:52 PM

## 2016-03-05 NOTE — Progress Notes (Signed)
PROGRESS NOTE        PATIENT DETAILS Name: Angel Brewer Age: 50 y.o. Sex: female Date of Birth: 09-26-1965 Admit Date: 02/29/2016 Admitting Physician Rush Farmer, MD PCP:No primary care provider on file.  Brief Narrative: Patient is a 50 y.o. female with past medical history of morbid obesity resented to the hospital with shortness of breath, further evaluation with a CT angiogram of the chest revealed pulmonary embolism. She was subsequently started on intravenous heparin, due to financial issues-she was started on Coumadin and not on NOAC's. PCCM felt that patient was not a candidate for thrombolytics.  Subjective: Shortness of breath much better.  Assessment/Plan: Active Problems: Acute hypoxic respiratory failure secondary to bilateral pulmonary embolism: Continue with intravenous heparin and Coumadin until INR is therapeutic. Patient has morbid obesity-and likely will not be a great candidate for Lovenox or NOAC's.  Right lower extremity DVT: Continue anticoagulation  Minimally elevated troponin: Likely secondary to pulmonary embolism-no regional wall motion seen on echocardiogram. Doubt any further workup required at this time.  Morbid obesity: Counseled regarding importance of weight loss  DVT Prophylaxis:  IV heparin / warfarin  Code Status: Full code   Family Communication: None at bedside  Disposition Plan: Remain inpatient-but will plan on Home health vs SNF on discharge  Antimicrobial agents: None  Procedures: TTE - EF 50-55 percent - no WMA - LA moderately dilated - RV moderately dilated with systolic function moderately reduced  Venous duplex - R LE DVT   CONSULTS:  pulmonary/intensive care  Time spent: 25 minutes-Greater than 50% of this time was spent in counseling, explanation of diagnosis, planning of further management, and coordination of care.  MEDICATIONS: Anti-infectives    None      Scheduled Meds: .  antiseptic oral rinse  7 mL Mouth Rinse BID  . warfarin  10 mg Oral ONCE-1800  . Warfarin - Pharmacist Dosing Inpatient   Does not apply q1800   Continuous Infusions: . sodium chloride    . heparin 2,250 Units/hr (03/05/16 1314)   PRN Meds:.acetaminophen, ondansetron (ZOFRAN) IV, oxymetazoline   PHYSICAL EXAM: Vital signs: Vitals:   03/04/16 2016 03/05/16 0518 03/05/16 0830 03/05/16 1300  BP: 124/84 131/85 113/64 122/83  Pulse: 86 77 83 77  Resp: _0 Temp: 98.1 F (36.7 C) 97.9 F (36.6 C) 98 F (36.7 C) 97.7 F (36.5 C)  TempSrc: Oral Oral Oral Oral  SpO2: 95% 93% 94% 94%  Weight:  (!) 191 kg (421 lb)    Height:       Filed Weights   03/03/16 0412 03/04/16 0613 03/05/16 0518  Weight: (!) 191.7 kg (422 lb 9.6 oz) (!) 191.5 kg (422 lb 1.6 oz) (!) 191 kg (421 lb)   Body mass index is 62.17 kg/m.   Gen Exam: Awake and alert with clear speech. Not in any distress  Neck: Supple, No JVD.   Chest: B/L Clear.   CVS: S1 S2 Regular, no murmurs.  Abdomen: soft, BS +, non tender, non distended.  Extremities: + edema, lower extremities warm to touch. Neurologic: Non Focal.   Skin: No Rash or lesions   Wounds: N/A.   I have personally reviewed following labs and imaging studies  LABORATORY DATA: CBC:  Recent Labs Lab 02/29/16 1212  03/01/16 0255 03/02/16 1004 03/03/16 0840 03/04/16 0341 03/05/16 0422  WBC 7.8  < >  6.4 5.7 4.2 5.3 4.6  NEUTROABS 4.0  --   --   --   --   --   --   HGB 14.1  < > 12.9 13.1 12.4 12.9 13.0  HCT 46.2*  < > 41.6 44.2 41.6 42.5 42.0  MCV 89.0  < > 88.7 92.3 91.6 89.7 88.6  PLT 222  < > 201 166 183 165 170  < > = values in this interval not displayed.  Basic Metabolic Panel:  Recent Labs Lab 02/29/16 1212 03/01/16 0255  NA 138 138  K 3.8 4.7  CL 105 104  CO2 24 28  GLUCOSE 97 133*  BUN 15 13  CREATININE 1.17* 1.08*  CALCIUM 9.3 9.2    GFR: Estimated Creatinine Clearance: 114.2 mL/min (by C-G formula based on SCr of  1.08 mg/dL).  Liver Function Tests:  Recent Labs Lab 03/01/16 0255  AST 16  ALT 12*  ALKPHOS 54  BILITOT 0.5  PROT 7.4  ALBUMIN 3.2*   No results for input(s): LIPASE, AMYLASE in the last 168 hours. No results for input(s): AMMONIA in the last 168 hours.  Coagulation Profile:  Recent Labs Lab 03/04/16 0957 03/05/16 0422  INR 1.09 1.16    Cardiac Enzymes: No results for input(s): CKTOTAL, CKMB, CKMBINDEX, TROPONINI in the last 168 hours.  BNP (last 3 results) No results for input(s): PROBNP in the last 8760 hours.  HbA1C: No results for input(s): HGBA1C in the last 72 hours.  CBG: No results for input(s): GLUCAP in the last 168 hours.  Lipid Profile: No results for input(s): CHOL, HDL, LDLCALC, TRIG, CHOLHDL, LDLDIRECT in the last 72 hours.  Thyroid Function Tests: No results for input(s): TSH, T4TOTAL, FREET4, T3FREE, THYROIDAB in the last 72 hours.  Anemia Panel: No results for input(s): VITAMINB12, FOLATE, FERRITIN, TIBC, IRON, RETICCTPCT in the last 72 hours.  Urine analysis: No results found for: COLORURINE, APPEARANCEUR, LABSPEC, PHURINE, GLUCOSEU, HGBUR, BILIRUBINUR, KETONESUR, PROTEINUR, UROBILINOGEN, NITRITE, LEUKOCYTESUR  Sepsis Labs: Lactic Acid, Venous No results found for: LATICACIDVEN  MICROBIOLOGY: Recent Results (from the past 240 hour(s))  MRSA PCR Screening     Status: None   Collection Time: 02/29/16  5:37 PM  Result Value Ref Range Status   MRSA by PCR NEGATIVE NEGATIVE Final    Comment:        The GeneXpert MRSA Assay (FDA approved for NASAL specimens only), is one component of a comprehensive MRSA colonization surveillance program. It is not intended to diagnose MRSA infection nor to guide or monitor treatment for MRSA infections.     RADIOLOGY STUDIES/RESULTS: Dg Chest 2 View  Result Date: 02/29/2016 CLINICAL DATA:  Shortness of breath for a few days. EXAM: CHEST  2 VIEW COMPARISON:  None. FINDINGS: Cardiac silhouette  is enlarged. Prominent right hilar structures which could represent vascular structures. Trachea is midline. No significant pleural effusions. Few linear densities in left lung are suggestive for atelectasis. Negative for a pneumothorax. No acute bone abnormality. IMPRESSION: Cardiomegaly with enlarged central vascular structures. Findings are suggestive for vascular congestion. Fullness in the right hilum and right paratracheal region. These findings could be related to enlarged vascular structures but difficult to exclude lymphadenopathy based on these two views. Recommend follow-up chest radiograph to ensure stability or further characterization with CT. Electronically Signed   By: Markus Daft M.D.   On: 02/29/2016 12:49   Ct Angio Chest Pe W Or Wo Contrast  Result Date: 02/29/2016 CLINICAL DATA:  Shortness of breath. EXAM: CT  ANGIOGRAPHY CHEST WITH CONTRAST TECHNIQUE: Multidetector CT imaging of the chest was performed using the standard protocol during bolus administration of intravenous contrast. Multiplanar CT image reconstructions and MIPs were obtained to evaluate the vascular anatomy. CONTRAST:  100 mL of Isovue 370 COMPARISON:  Chest x-ray from earlier today FINDINGS: The central airways are within normal limits. No pneumothorax. Scattered atelectasis. There is mixed attenuation of lung parenchyma with areas of lower and higher density. No interlobular septal thickening. No suspicious pulmonary nodules or masses. No evidence of pneumonia. Evaluation of pulmonary arteries is somewhat limited due to patient body habitus. However, there are clearly bilateral pulmonary emboli involving both upper lobes, the right middle lobe, and both lower lobes. The largest burden of emboli is in the right lower lobe. The volume of the right ventricle appears to be mildly greater than the left and there is mild bowing of the intraventricular septum to the left such as on series 6, image 207 suggesting right heart strain.  Cardiomegaly is identified. No effusions. Evaluation of the thoracic aorta is limited due to timing of the contrast bolus but no aneurysm is identified. There is a mildly prominent node adjacent to the azygos vein measuring 15 mm, possibly reactive. Recommend attention on follow-up. No other adenopathy. Evaluation of the upper abdomen is limited but normal. Degenerative changes seen in the spine. Review of the MIP images confirms the above findings. IMPRESSION: 1. Bilateral pulmonary emboli most prominent in the right lower lobe with a moderate burden. However, the right ventricular volume is a [place] greater than the left and there is mild bowing of the intraventricular septum to the left suggesting right heart strain. 2. Probable reactive node.  Recommend attention on follow-up. 3. Mixed attenuation of the lung parenchyma could represent mosaic perfusion versus air trapping. Findings called to Dr. Kathrynn Humble Electronically Signed   By: Dorise Bullion III M.D   On: 02/29/2016 15:03     LOS: 5 days   Oren Binet, MD  [facility] Pager:[phone]  If 7PM-7AM, please contact night-coverage [email or web address] Password [facility] 03/05/2016, 3:42 PM

## 2016-03-05 NOTE — Progress Notes (Signed)
ANTICOAGULATION CONSULT NOTE - Follow Up Consult  Pharmacy Consult for Heparin and Warfarin Indication: pulmonary embolus, new onset  No Known Allergies  Patient Measurements: Height: _0  (175.3 cm) Weight: (!) 422 lb 1.6 oz (191.5 kg) IBW/kg (Calculated) : 66.2  Heparin dosing wt: 116 kg  Vital Signs: Temp: 98.1 F (36.7 C) (08/22 2016) Temp Source: Oral (08/22 2016) BP: 124/84 (08/22 2016) Pulse Rate: 86 (08/22 2016)  Labs:  Recent Labs  03/03/16 0840 03/04/16 0341 03/04/16 0957 03/05/16 0422  HGB 12.4 12.9  --  13.0  HCT 41.6 42.5  --  42.0  PLT 183 165  --  170  LABPROT  --   --  14.2 14.8  INR  --   --  1.09 1.16  HEPARINUNFRC 0.61 0.41  --  0.28*    Estimated Creatinine Clearance: 114.4 mL/min (by C-G formula based on SCr of 1.08 mg/dL).  Assessment: Continues on heparin for bilateral PE - warfarin added 8/21. Heparin level down to slightly subtherapeutic (0.28) on 1750 units/hr. INR moving slowly on 7.51m daily - up to 1.16. No issues with line or bleeding reported per RN. CBC stable.  Goal of Therapy:  INR 2-3 Heparin level 0.3-0.7 units/ml Monitor platelets by anticoagulation protocol: Yes   Plan:  -Increase heparin to 1950 units/hr -F/u 6 hour heparin level -Warfarin 154mx1 -Daily HL, INR, CBC -Monitor s/sx bleeding  CaSherlon HandingPharmD, BCPS Clinical pharmacist, pager [phone] AM, 03/05/2016

## 2016-03-06 DIAGNOSIS — I824Z1 Acute embolism and thrombosis of unspecified deep veins of right distal lower extremity: Secondary | ICD-10-CM

## 2016-03-06 LAB — CBC
HCT: 43.9 % (ref 36.0–46.0)
Hemoglobin: 13.6 g/dL (ref 12.0–15.0)
MCH: 27.4 pg (ref 26.0–34.0)
MCHC: 31 g/dL (ref 30.0–36.0)
MCV: 88.3 fL (ref 78.0–100.0)
PLATELETS: 201 10*3/uL (ref 150–400)
RBC: 4.97 MIL/uL (ref 3.87–5.11)
RDW: 15.1 % (ref 11.5–15.5)
WBC: 5.5 10*3/uL (ref 4.0–10.5)

## 2016-03-06 LAB — PROTIME-INR
INR: 1.33
PROTHROMBIN TIME: 16.6 s — AB (ref 11.4–15.2)

## 2016-03-06 LAB — HEPARIN LEVEL (UNFRACTIONATED): Heparin Unfractionated: 0.65 IU/mL (ref 0.30–0.70)

## 2016-03-06 MED ORDER — COUMADIN BOOK
Freq: Once | Status: DC
  Filled 2016-03-06: qty 1

## 2016-03-06 MED ORDER — WARFARIN SODIUM 10 MG PO TABS
10.0000 mg | ORAL_TABLET | Freq: Once | ORAL | Status: AC
  Administered 2016-03-06: 10 mg via ORAL
  Filled 2016-03-06: qty 1

## 2016-03-06 NOTE — Progress Notes (Signed)
PROGRESS NOTE        PATIENT DETAILS Name: Angel Brewer Age: 50 y.o. Sex: female Date of Birth: [birth year] Admit Date: 02/29/2016 Admitting Physician Rush Farmer, MD PCP:No primary care provider on file.  Brief Narrative: Patient is a 49 y.o. female with past medical history of morbid obesity resented to the hospital with shortness of breath, further evaluation with a CT angiogram of the chest revealed pulmonary embolism. She was subsequently started on intravenous heparin, due to financial issues-she was started on Coumadin and not on NOAC's. PCCM felt that patient was not a candidate for thrombolytics.  Subjective: Setting at bedside-no complaints-she is able to embolism more. No chest pain.  Assessment/Plan: Active Problems: Acute hypoxic respiratory failure secondary to bilateral pulmonary embolism: Continue with intravenous heparin and Coumadin until INR is therapeutic. Patient has morbid obesity-and likely will not be a great candidate for Lovenox or NOAC's.  Right lower extremity DVT: Continue anticoagulation-see above  Minimally elevated troponin: Likely secondary to pulmonary embolism-no regional wall motion seen on echocardiogram. Doubt any further workup required at this time.  Morbid obesity: Counseled regarding importance of weight loss  DVT Prophylaxis:  IV heparin / warfarin  Code Status: Full code   Family Communication: None at bedside  Disposition Plan: Remain inpatient-home once INR is therapeutic  Antimicrobial agents: None  Procedures: TTE - EF 50-55 percent - no WMA - LA moderately dilated - RV moderately dilated with systolic function moderately reduced  Venous duplex - R LE DVT   CONSULTS:  pulmonary/intensive care  Time spent: 25 minutes-Greater than 50% of this time was spent in counseling, explanation of diagnosis, planning of further management, and coordination of care.  MEDICATIONS: Anti-infectives    None      Scheduled Meds: . antiseptic oral rinse  7 mL Mouth Rinse BID  . warfarin  10 mg Oral ONCE-1800  . Warfarin - Pharmacist Dosing Inpatient   Does not apply q1800   Continuous Infusions: . sodium chloride    . heparin 2,250 Units/hr (03/06/16 9692)   PRN Meds:.acetaminophen, ondansetron (ZOFRAN) IV, oxymetazoline   PHYSICAL EXAM: Vital signs: Vitals:   03/05/16 2014 03/06/16 0546 03/06/16 0736 03/06/16 1004  BP: 128/76 116/80 (!) 130/92   Pulse: 82 76 70 80  Resp: 18 18 (!) 28 (!) 22  Temp: 98.3 F (36.8 C) 97.7 F (36.5 C) 98 F (36.7 C)   TempSrc: Oral Oral Oral   SpO2: 97% 95% 96%   Weight:  (!) 191.1 kg (421 lb 6.4 oz)    Height:       Filed Weights   03/04/16 0613 03/05/16 0518 03/06/16 0546  Weight: (!) 191.5 kg (422 lb 1.6 oz) (!) 191 kg (421 lb) (!) 191.1 kg (421 lb 6.4 oz)   Body mass index is 62.23 kg/m.   Gen Exam: Awake and alert with clear speech. Not in any distress  Neck: Supple, No JVD.   Chest: B/L Clear.  No added sounds CVS: S1 S2 Regular, no murmurs.  Abdomen: soft, BS +, non tender, non distended. Obese abdomen Extremities: + Trace edema, lower extremities warm to touch. Neurologic: Non Focal.   Skin: No Rash or lesions   Wounds: N/A.   I have personally reviewed following labs and imaging studies  LABORATORY DATA: CBC:  Recent Labs Lab 02/29/16 1212  03/02/16 1004 03/03/16 0840 03/04/16  7829 03/05/16 0422 03/06/16 0547  WBC 7.8  < > 5.7 4.2 5.3 4.6 5.5  NEUTROABS 4.0  --   --   --   --   --   --   HGB 14.1  < > 13.1 12.4 12.9 13.0 13.6  HCT 46.2*  < > 44.2 41.6 42.5 42.0 43.9  MCV 89.0  < > 92.3 91.6 89.7 88.6 88.3  PLT 222  < > 166 183 165 170 201  < > = values in this interval not displayed.  Basic Metabolic Panel:  Recent Labs Lab 02/29/16 1212 03/01/16 0255  NA 138 138  K 3.8 4.7  CL 105 104  CO2 24 28  GLUCOSE 97 133*  BUN 15 13  CREATININE 1.17* 1.08*  CALCIUM 9.3 9.2    GFR: Estimated  Creatinine Clearance: 114.3 mL/min (by C-G formula based on SCr of 1.08 mg/dL).  Liver Function Tests:  Recent Labs Lab 03/01/16 0255  AST 16  ALT 12*  ALKPHOS 54  BILITOT 0.5  PROT 7.4  ALBUMIN 3.2*   No results for input(s): LIPASE, AMYLASE in the last 168 hours. No results for input(s): AMMONIA in the last 168 hours.  Coagulation Profile:  Recent Labs Lab 03/04/16 0957 03/05/16 0422 03/06/16 0547  INR 1.09 1.16 1.33    Cardiac Enzymes: No results for input(s): CKTOTAL, CKMB, CKMBINDEX, TROPONINI in the last 168 hours.  BNP (last 3 results) No results for input(s): PROBNP in the last 8760 hours.  HbA1C: No results for input(s): HGBA1C in the last 72 hours.  CBG: No results for input(s): GLUCAP in the last 168 hours.  Lipid Profile: No results for input(s): CHOL, HDL, LDLCALC, TRIG, CHOLHDL, LDLDIRECT in the last 72 hours.  Thyroid Function Tests: No results for input(s): TSH, T4TOTAL, FREET4, T3FREE, THYROIDAB in the last 72 hours.  Anemia Panel: No results for input(s): VITAMINB12, FOLATE, FERRITIN, TIBC, IRON, RETICCTPCT in the last 72 hours.  Urine analysis: No results found for: COLORURINE, APPEARANCEUR, LABSPEC, PHURINE, GLUCOSEU, HGBUR, BILIRUBINUR, KETONESUR, PROTEINUR, UROBILINOGEN, NITRITE, LEUKOCYTESUR  Sepsis Labs: Lactic Acid, Venous No results found for: LATICACIDVEN  MICROBIOLOGY: Recent Results (from the past 240 hour(s))  MRSA PCR Screening     Status: None   Collection Time: 02/29/16  5:37 PM  Result Value Ref Range Status   MRSA by PCR NEGATIVE NEGATIVE Final    Comment:        The GeneXpert MRSA Assay (FDA approved for NASAL specimens only), is one component of a comprehensive MRSA colonization surveillance program. It is not intended to diagnose MRSA infection nor to guide or monitor treatment for MRSA infections.     RADIOLOGY STUDIES/RESULTS: Dg Chest 2 View  Result Date: 02/29/2016 CLINICAL DATA:  Shortness of  breath for a few days. EXAM: CHEST  2 VIEW COMPARISON:  None. FINDINGS: Cardiac silhouette is enlarged. Prominent right hilar structures which could represent vascular structures. Trachea is midline. No significant pleural effusions. Few linear densities in left lung are suggestive for atelectasis. Negative for a pneumothorax. No acute bone abnormality. IMPRESSION: Cardiomegaly with enlarged central vascular structures. Findings are suggestive for vascular congestion. Fullness in the right hilum and right paratracheal region. These findings could be related to enlarged vascular structures but difficult to exclude lymphadenopathy based on these two views. Recommend follow-up chest radiograph to ensure stability or further characterization with CT. Electronically Signed   By: Markus Daft M.D.   On: 02/29/2016 12:49   Ct Angio Chest Pe W Or  Wo Contrast  Result Date: 02/29/2016 CLINICAL DATA:  Shortness of breath. EXAM: CT ANGIOGRAPHY CHEST WITH CONTRAST TECHNIQUE: Multidetector CT imaging of the chest was performed using the standard protocol during bolus administration of intravenous contrast. Multiplanar CT image reconstructions and MIPs were obtained to evaluate the vascular anatomy. CONTRAST:  100 mL of Isovue 370 COMPARISON:  Chest x-ray from earlier today FINDINGS: The central airways are within normal limits. No pneumothorax. Scattered atelectasis. There is mixed attenuation of lung parenchyma with areas of lower and higher density. No interlobular septal thickening. No suspicious pulmonary nodules or masses. No evidence of pneumonia. Evaluation of pulmonary arteries is somewhat limited due to patient body habitus. However, there are clearly bilateral pulmonary emboli involving both upper lobes, the right middle lobe, and both lower lobes. The largest burden of emboli is in the right lower lobe. The volume of the right ventricle appears to be mildly greater than the left and there is mild bowing of the  intraventricular septum to the left such as on series 6, image 207 suggesting right heart strain. Cardiomegaly is identified. No effusions. Evaluation of the thoracic aorta is limited due to timing of the contrast bolus but no aneurysm is identified. There is a mildly prominent node adjacent to the azygos vein measuring 15 mm, possibly reactive. Recommend attention on follow-up. No other adenopathy. Evaluation of the upper abdomen is limited but normal. Degenerative changes seen in the spine. Review of the MIP images confirms the above findings. IMPRESSION: 1. Bilateral pulmonary emboli most prominent in the right lower lobe with a moderate burden. However, the right ventricular volume is a [place] greater than the left and there is mild bowing of the intraventricular septum to the left suggesting right heart strain. 2. Probable reactive node.  Recommend attention on follow-up. 3. Mixed attenuation of the lung parenchyma could represent mosaic perfusion versus air trapping. Findings called to Dr. Kathrynn Humble Electronically Signed   By: Dorise Bullion III M.D   On: 02/29/2016 15:03     LOS: 6 days   Oren Binet, MD  [facility] Pager:[phone]  If 7PM-7AM, please contact night-coverage [email or web address] Password [facility] 03/06/2016, 10:49 AM

## 2016-03-06 NOTE — Discharge Instructions (Signed)

## 2016-03-06 NOTE — Progress Notes (Signed)
Occupational Therapy Treatment Patient Details Name: Angel Brewer MRN: [id number] DOB: 02/04/1966 Today's Date: 03/06/2016    History of present illness 50 y.o. female admitted with bilateral PE with hypoxic respiratory failure. PMH: morbid obesity.   OT comments  Pt progressing towards acute OT goals. Focus of session was AE education (issued toilet aide) and reinforcing energy conservation education. Pt declined mobility/ADL activity. D/c plan remains appropriate.  Follow Up Recommendations  No OT follow up;Supervision - Intermittent    Equipment Recommendations  Tub/shower seat (([employer] (pt over 400#)))    Recommendations for Other Services      Precautions / Restrictions Precautions Precautions: Fall Restrictions Weight Bearing Restrictions: No       Mobility Bed Mobility               General bed mobility comments: Pt sitting in chair upon arrival  Transfers                      Balance                                   ADL Overall ADL's : Needs assistance/impaired                                       General ADL Comments: Pt received in recliner. Issued toilet aide and reviewed energy conservation education. Discussed alternating work and rest and how to monitor activity toleration. Declined mobility/ADL activity this session.       Vision                     Perception     Praxis      Cognition   Behavior During Therapy: WFL for tasks assessed/performed Overall Cognitive Status: Within Functional Limits for tasks assessed                       Extremity/Trunk Assessment               Exercises     Shoulder Instructions       General Comments      Pertinent Vitals/ Pain       Pain Assessment: Faces Faces Pain Scale: No hurt  Home Living                                          Prior Functioning/Environment              Frequency Min 3X/week      Progress Toward Goals  OT Goals(current goals can now be found in the care plan section)  Progress towards OT goals: Progressing toward goals  Acute Rehab OT Goals Patient Stated Goal: to be able to do my self care easier OT Goal Formulation: With patient Time For Goal Achievement: 03/11/16 Potential to Achieve Goals: Good ADL Goals Pt Will Perform Lower Body Bathing: with modified independence;sit to/from stand;with adaptive equipment Pt Will Perform Lower Body Dressing: with modified independence;with adaptive equipment;sit to/from stand Pt Will Perform Toileting - Clothing Manipulation and hygiene: with modified independence;sitting/lateral leans;sit to/from stand;with adaptive equipment Additional ADL Goal #1: Pt will verbalize 3 energy conservation techniques to use for ADL/IADL tasks.  Plan Discharge  plan remains appropriate    Co-evaluation                 End of Session     Activity Tolerance Patient tolerated treatment well   Patient Left in chair;with call bell/phone within reach   Nurse Communication          Time: 3612-2449 OT Time Calculation (min): 8 min  Charges: OT Treatments $Self Care/Home Management : 8-22 mins  Hortencia Pilar 03/06/2016, 11:07 AM

## 2016-03-06 NOTE — Progress Notes (Signed)
ANTICOAGULATION CONSULT NOTE - Follow Up Consult  Pharmacy Consult for Heparin and Warfarin Indication: pulmonary embolus, new onset  No Known Allergies  Patient Measurements: Height: _0  (175.3 cm) Weight: (!) 421 lb 6.4 oz (191.1 kg) (scale b) IBW/kg (Calculated) : 66.2  Heparin dosing wt: 116 kg  Vital Signs: Temp: 97.7 F (36.5 C) (08/24 0546) Temp Source: Oral (08/24 0546) BP: 116/80 (08/24 0546) Pulse Rate: 76 (08/24 0546)  Labs:  Recent Labs  03/04/16 0341 03/04/16 0957 03/05/16 0422 03/05/16 1147 03/05/16 1914 03/06/16 0547  HGB 12.9  --  13.0  --   --  13.6  HCT 42.5  --  42.0  --   --  43.9  PLT 165  --  170  --   --  201  LABPROT  --  14.2 14.8  --   --  16.6*  INR  --  1.09 1.16  --   --  1.33  HEPARINUNFRC 0.41  --  0.28* 0.27* 0.51 0.65    Estimated Creatinine Clearance: 114.3 mL/min (by C-G formula based on SCr of 1.08 mg/dL).  Assessment: 50 yo F continues on heparin for bilateral PE and R lower extremity DVT. Coumadin added 8/21. Need to bridge for a minimum of 5 days and until INR > 2 for 24 hrs. Today is day #4 of brdige. HL remains therapeutic at 0.65. INR starting to trend up to 1.33. CBC wnl and stable. No bleeding reported.   Goal of Therapy:  INR 2-3 Heparin level 0.3-0.7 units/ml Monitor platelets by anticoagulation protocol: Yes   Plan:  Continue heparin gtt at 2,250 units/hr Give Coumadin 66m PO x 1  Monitor daily HL and INR, CBC, s/s of bleed  NElenor Quinones PharmD, BKingman Regional Medical Center-Hualapai Mountain CampusClinical Pharmacist Pager [phone]/24/2017 7:31 AM

## 2016-03-07 ENCOUNTER — Inpatient Hospital Stay: Payer: Self-pay

## 2016-03-07 DIAGNOSIS — I82401 Acute embolism and thrombosis of unspecified deep veins of right lower extremity: Secondary | ICD-10-CM

## 2016-03-07 LAB — CBC
HCT: 39.9 % (ref 36.0–46.0)
Hemoglobin: 12.3 g/dL (ref 12.0–15.0)
MCH: 27.5 pg (ref 26.0–34.0)
MCHC: 30.8 g/dL (ref 30.0–36.0)
MCV: 89.1 fL (ref 78.0–100.0)
PLATELETS: 197 10*3/uL (ref 150–400)
RBC: 4.48 MIL/uL (ref 3.87–5.11)
RDW: 15.4 % (ref 11.5–15.5)
WBC: 4.4 10*3/uL (ref 4.0–10.5)

## 2016-03-07 LAB — PROTIME-INR
INR: 1.74
Prothrombin Time: 20.6 seconds — ABNORMAL HIGH (ref 11.4–15.2)

## 2016-03-07 LAB — HEPARIN LEVEL (UNFRACTIONATED): HEPARIN UNFRACTIONATED: 0.67 [IU]/mL (ref 0.30–0.70)

## 2016-03-07 MED ORDER — CHLORHEXIDINE GLUCONATE 0.12 % MT SOLN
15.0000 mL | Freq: Two times a day (BID) | OROMUCOSAL | Status: DC
  Administered 2016-03-08: 15 mL via OROMUCOSAL
  Filled 2016-03-07: qty 15

## 2016-03-07 MED ORDER — WARFARIN SODIUM 5 MG PO TABS
5.0000 mg | ORAL_TABLET | Freq: Once | ORAL | Status: AC
  Administered 2016-03-07: 5 mg via ORAL
  Filled 2016-03-07: qty 1

## 2016-03-07 MED ORDER — ORAL CARE MOUTH RINSE: 15.0000 mL | Freq: Two times a day (BID) | OROMUCOSAL | Status: DC

## 2016-03-07 NOTE — Progress Notes (Signed)
BP 141/96. Pt trends around 120/70s. Pt states she's only moved from the bed to chair, and is in no pain. MD notified.  Celestia Khat, RN

## 2016-03-07 NOTE — Progress Notes (Signed)
ANTICOAGULATION CONSULT NOTE - Follow Up Consult  Pharmacy Consult for Heparin and Warfarin Indication: pulmonary embolus, new onset  No Known Allergies  Patient Measurements: Height: _0  (175.3 cm) Weight: (!) 421 lb 11.2 oz (191.3 kg) (scale b) IBW/kg (Calculated) : 66.2  Heparin dosing wt: 116 kg  Vital Signs: Temp: 97.8 F (36.6 C) (08/25 0545) Temp Source: Oral (08/25 0545) BP: 129/74 (08/25 0545) Pulse Rate: 72 (08/25 0545)   Assessment: 50 yo F continues on heparin for bilateral PE. Coumadin added 8/21. Need to bridge until INR > 2 for 24 hrs. HL remains therapeutic at 0.67 but borderline high. INR trending up to 1.74. CBC wnl and stable. No bleeding reported.   Goal of Therapy:  INR 2-3 Heparin level 0.3-0.7 units/ml Monitor platelets by anticoagulation protocol: Yes   Plan:  Decrease heparin gtt slightly to 2,150 units/hr Give Coumadin 64m PO x 1  Monitor daily HL and INR, CBC, s/s of bleed  NElenor Quinones PharmD, BCalvary HospitalClinical Pharmacist Pager [phone]/25/2017 7:52 AM

## 2016-03-07 NOTE — Progress Notes (Signed)
PROGRESS NOTE        PATIENT DETAILS Name: Angel Brewer Age: 50 y.o. Sex: female Date of Birth: February 11, 1966 Admit Date: 02/29/2016 Admitting Physician Rush Farmer, MD PCP:No primary care provider on file.  Brief Narrative: Patient is a 50 y.o. female with past medical history of morbid obesity resented to the hospital with shortness of breath, further evaluation with a CT angiogram of the chest revealed pulmonary embolism. She was subsequently started on intravenous heparin, due to financial issues-she was started on Coumadin and not on NOAC's. PCCM felt that patient was not a candidate for thrombolytics.  Subjective: No shortness of breath or chest pain-lying comfortably in bed this morning..  Assessment/Plan: Active Problems: Acute hypoxic respiratory failure secondary to bilateral pulmonary embolism: Continue with intravenous heparin and Coumadin until INR is therapeutic. Patient has morbid obesity-and likely will not be a great candidate for Lovenox or NOAC's.  Right lower extremity DVT: Continue anticoagulation-see above  Minimally elevated troponin: Likely secondary to pulmonary embolism-no regional wall motion seen on echocardiogram. Doubt any further workup required at this time.  Morbid obesity: Counseled regarding importance of weight loss  DVT Prophylaxis:  IV heparin / warfarin  Code Status: Full code   Family Communication: None at bedside  Disposition Plan: Remain inpatient-home once INR is therapeutic-likely 8/26 or 8/27  Antimicrobial agents: None  Procedures: TTE - EF 50-55 percent - no WMA - LA moderately dilated - RV moderately dilated with systolic function moderately reduced  Venous duplex - R LE DVT   CONSULTS:  pulmonary/intensive care  Time spent: 25 minutes-Greater than 50% of this time was spent in counseling, explanation of diagnosis, planning of further management, and coordination of  care.  MEDICATIONS: Anti-infectives    None      Scheduled Meds: . antiseptic oral rinse  7 mL Mouth Rinse BID  . coumadin book   Does not apply Once  . warfarin  5 mg Oral ONCE-1800  . Warfarin - Pharmacist Dosing Inpatient   Does not apply q1800   Continuous Infusions: . sodium chloride    . heparin 2,150 Units/hr (03/07/16 0833)   PRN Meds:.acetaminophen, ondansetron (ZOFRAN) IV, oxymetazoline   PHYSICAL EXAM: Vital signs: Vitals:   03/06/16 1004 03/06/16 1102 03/06/16 1943 03/07/16 0545  BP:  (!) 127/97 124/75 129/74  Pulse: 80 72 81 72  Resp: (!) _0 Temp:  97.5 F (36.4 C) 97.5 F (36.4 C) 97.8 F (36.6 C)  TempSrc:  Oral Oral Oral  SpO2:  97% 95% 94%  Weight:    (!) 191.3 kg (421 lb 11.2 oz)  Height:       Filed Weights   03/05/16 0518 03/06/16 0546 03/07/16 0545  Weight: (!) 191 kg (421 lb) (!) 191.1 kg (421 lb 6.4 oz) (!) 191.3 kg (421 lb 11.2 oz)   Body mass index is 62.27 kg/m.   Gen Exam: Awake and alert with clear speech. Not in any distress  Neck: Supple, No JVD.   Chest: B/L Clear.  No added sounds CVS: S1 S2 Regular, no murmurs.  Abdomen: soft, BS +, non tender, non distended. Obese abdomen Extremities: + Trace edema, lower extremities warm to touch. Neurologic: Non Focal.   Skin: No Rash or lesions   Wounds: N/A.   I have personally reviewed following labs and imaging studies  LABORATORY DATA:  CBC:  Recent Labs Lab 02/29/16 1212  03/03/16 0840 03/04/16 0341 03/05/16 0422 03/06/16 0547 03/07/16 0328  WBC 7.8  < > 4.2 5.3 4.6 5.5 4.4  NEUTROABS 4.0  --   --   --   --   --   --   HGB 14.1  < > 12.4 12.9 13.0 13.6 12.3  HCT 46.2*  < > 41.6 42.5 42.0 43.9 39.9  MCV 89.0  < > 91.6 89.7 88.6 88.3 89.1  PLT 222  < > 183 165 170 201 197  < > = values in this interval not displayed.  Basic Metabolic Panel:  Recent Labs Lab 02/29/16 1212 03/01/16 0255  NA 138 138  K 3.8 4.7  CL 105 104  CO2 24 28  GLUCOSE 97 133*   BUN 15 13  CREATININE 1.17* 1.08*  CALCIUM 9.3 9.2    GFR: Estimated Creatinine Clearance: 114.3 mL/min (by C-G formula based on SCr of 1.08 mg/dL).  Liver Function Tests:  Recent Labs Lab 03/01/16 0255  AST 16  ALT 12*  ALKPHOS 54  BILITOT 0.5  PROT 7.4  ALBUMIN 3.2*   No results for input(s): LIPASE, AMYLASE in the last 168 hours. No results for input(s): AMMONIA in the last 168 hours.  Coagulation Profile:  Recent Labs Lab 03/04/16 0957 03/05/16 0422 03/06/16 0547 03/07/16 0328  INR 1.09 1.16 1.33 1.74    Cardiac Enzymes: No results for input(s): CKTOTAL, CKMB, CKMBINDEX, TROPONINI in the last 168 hours.  BNP (last 3 results) No results for input(s): PROBNP in the last 8760 hours.  HbA1C: No results for input(s): HGBA1C in the last 72 hours.  CBG: No results for input(s): GLUCAP in the last 168 hours.  Lipid Profile: No results for input(s): CHOL, HDL, LDLCALC, TRIG, CHOLHDL, LDLDIRECT in the last 72 hours.  Thyroid Function Tests: No results for input(s): TSH, T4TOTAL, FREET4, T3FREE, THYROIDAB in the last 72 hours.  Anemia Panel: No results for input(s): VITAMINB12, FOLATE, FERRITIN, TIBC, IRON, RETICCTPCT in the last 72 hours.  Urine analysis: No results found for: COLORURINE, APPEARANCEUR, LABSPEC, PHURINE, GLUCOSEU, HGBUR, BILIRUBINUR, KETONESUR, PROTEINUR, UROBILINOGEN, NITRITE, LEUKOCYTESUR  Sepsis Labs: Lactic Acid, Venous No results found for: LATICACIDVEN  MICROBIOLOGY: Recent Results (from the past 240 hour(s))  MRSA PCR Screening     Status: None   Collection Time: 02/29/16  5:37 PM  Result Value Ref Range Status   MRSA by PCR NEGATIVE NEGATIVE Final    Comment:        The GeneXpert MRSA Assay (FDA approved for NASAL specimens only), is one component of a comprehensive MRSA colonization surveillance program. It is not intended to diagnose MRSA infection nor to guide or monitor treatment for MRSA infections.      RADIOLOGY STUDIES/RESULTS: Dg Chest 2 View  Result Date: 02/29/2016 CLINICAL DATA:  Shortness of breath for a few days. EXAM: CHEST  2 VIEW COMPARISON:  None. FINDINGS: Cardiac silhouette is enlarged. Prominent right hilar structures which could represent vascular structures. Trachea is midline. No significant pleural effusions. Few linear densities in left lung are suggestive for atelectasis. Negative for a pneumothorax. No acute bone abnormality. IMPRESSION: Cardiomegaly with enlarged central vascular structures. Findings are suggestive for vascular congestion. Fullness in the right hilum and right paratracheal region. These findings could be related to enlarged vascular structures but difficult to exclude lymphadenopathy based on these two views. Recommend follow-up chest radiograph to ensure stability or further characterization with CT. Electronically Signed   By: Quita Skye  Anselm Pancoast M.D.   On: 02/29/2016 12:49   Ct Angio Chest Pe W Or Wo Contrast  Result Date: 02/29/2016 CLINICAL DATA:  Shortness of breath. EXAM: CT ANGIOGRAPHY CHEST WITH CONTRAST TECHNIQUE: Multidetector CT imaging of the chest was performed using the standard protocol during bolus administration of intravenous contrast. Multiplanar CT image reconstructions and MIPs were obtained to evaluate the vascular anatomy. CONTRAST:  100 mL of Isovue 370 COMPARISON:  Chest x-ray from earlier today FINDINGS: The central airways are within normal limits. No pneumothorax. Scattered atelectasis. There is mixed attenuation of lung parenchyma with areas of lower and higher density. No interlobular septal thickening. No suspicious pulmonary nodules or masses. No evidence of pneumonia. Evaluation of pulmonary arteries is somewhat limited due to patient body habitus. However, there are clearly bilateral pulmonary emboli involving both upper lobes, the right middle lobe, and both lower lobes. The largest burden of emboli is in the right lower lobe. The  volume of the right ventricle appears to be mildly greater than the left and there is mild bowing of the intraventricular septum to the left such as on series 6, image 207 suggesting right heart strain. Cardiomegaly is identified. No effusions. Evaluation of the thoracic aorta is limited due to timing of the contrast bolus but no aneurysm is identified. There is a mildly prominent node adjacent to the azygos vein measuring 15 mm, possibly reactive. Recommend attention on follow-up. No other adenopathy. Evaluation of the upper abdomen is limited but normal. Degenerative changes seen in the spine. Review of the MIP images confirms the above findings. IMPRESSION: 1. Bilateral pulmonary emboli most prominent in the right lower lobe with a moderate burden. However, the right ventricular volume is a [place] greater than the left and there is mild bowing of the intraventricular septum to the left suggesting right heart strain. 2. Probable reactive node.  Recommend attention on follow-up. 3. Mixed attenuation of the lung parenchyma could represent mosaic perfusion versus air trapping. Findings called to Dr. Kathrynn Humble Electronically Signed   By: Dorise Bullion III M.D   On: 02/29/2016 15:03     LOS: 7 days   Oren Binet, MD  [facility] Pager:[phone]  If 7PM-7AM, please contact night-coverage [email or web address] Password [facility] 03/07/2016, 11:53 AM

## 2016-03-08 LAB — PROTIME-INR
INR: 2.07
Prothrombin Time: 23.6 seconds — ABNORMAL HIGH (ref 11.4–15.2)

## 2016-03-08 LAB — CBC
HCT: 40.9 % (ref 36.0–46.0)
Hemoglobin: 12.7 g/dL (ref 12.0–15.0)
MCH: 27.7 pg (ref 26.0–34.0)
MCHC: 31.1 g/dL (ref 30.0–36.0)
MCV: 89.3 fL (ref 78.0–100.0)
PLATELETS: 190 10*3/uL (ref 150–400)
RBC: 4.58 MIL/uL (ref 3.87–5.11)
RDW: 15.5 % (ref 11.5–15.5)
WBC: 4.3 10*3/uL (ref 4.0–10.5)

## 2016-03-08 LAB — HEPARIN LEVEL (UNFRACTIONATED): Heparin Unfractionated: 0.76 IU/mL — ABNORMAL HIGH (ref 0.30–0.70)

## 2016-03-08 MED ORDER — ENOXAPARIN SODIUM 150 MG/ML ~~LOC~~ SOLN
200.0000 mg | Freq: Once | SUBCUTANEOUS | Status: AC
  Administered 2016-03-08: 200 mg via SUBCUTANEOUS
  Filled 2016-03-08: qty 1.33

## 2016-03-08 MED ORDER — WARFARIN SODIUM 7.5 MG PO TABS: 7.5000 mg | ORAL_TABLET | Freq: Every day | ORAL | Status: DC

## 2016-03-08 MED ORDER — WARFARIN SODIUM 5 MG PO TABS: 7.5000 mg | ORAL_TABLET | Freq: Every day | ORAL | 0 refills | Status: DC

## 2016-03-08 MED ORDER — WARFARIN SODIUM 5 MG PO TABS: 5.0000 mg | ORAL_TABLET | Freq: Once | ORAL | Status: DC

## 2016-03-08 NOTE — Discharge Summary (Addendum)
PATIENT DETAILS Name: Angel Brewer Age: 50 y.o. Sex: female Date of Birth: 02/21/1966 MRN: [id number]. Admitting Physician: Rush Farmer, MD PCP:No primary care provider on file.  Admit Date: 02/29/2016 Discharge date: 03/08/2016  Recommendations for Outpatient Follow-up:  1. Follow up with [facility]CP[facility] on 8/28 for a INR check 2. Ensure evaluation by hematology prior to discontinuing anticoagulation 3. Consider referral to bariatric clinic 4. CT chest scan showed a mildly prominent node adjacent to the azygos vein measuring 15 mm,possibly reactive. Please repeat CT scan in the next 3-6 months to make sure this is resolved. 5. Encourage weight loss, may need outpatient sleep study 6. Please obtain BMP/CBC in one week 7. Please follow up on the following pending results:  Admitted From:  Home  Disposition: [place]: No  Equipment/Devices: None  Discharge Condition: Stable  CODE STATUS: FULL CODE  Diet recommendation:  Heart Healthy   Brief Summary: See H&P, Labs, Consult and Test reports for all details in brief,Patient is a 50 y.o. female with past medical history of morbid obesity resented to the hospital with shortness of breath, further evaluation with a CT angiogram of the chest revealed pulmonary embolism. She was subsequently started on intravenous heparin, due to financial issues-she was started on Coumadin and not on NOAC's. PCCM felt that patient was not a candidate for thrombolytics. By day of discharge, INR was therapeutic, she is being discharged home in a stable manner, she has been asked to call [facility] on 8/28 to check INR.   Brief Hospital Course: Acute hypoxic respiratory failure secondary to bilateral pulmonary embolism: Acute hypoxic respiratory failure has resolved, she is now on room air. She was managed with overlapping intravenous heparin and Coumadin. By day of discharge her  INR was therapeutic. She has been asked to call the [facility] on 8/28 for INR check. She is being discharged on 7.5 mg of Coumadin. Please ensure that the patient gets a referral for a hematology evaluation prior to considering discontinuation of anticoagulation over the next few months.   Right lower extremity DVT: Continue anticoagulation-see above  Minimally elevated troponin: Likely secondary to pulmonary embolism-no regional wall motion seen on echocardiogram. Doubt any further workup required at this time.  Morbid obesity: Counseled regarding importance of weight loss. Please refer patient for sleep study.  CT chest scan showed a mildly prominent node adjacent to the azygos vein measuring 15 mm,possibly reactive. Please repeat CT scan in the next 3-6 months to make sure this is resolved.  Procedures/Studies: TTE - EF 50-55 percent - no WMA - LA moderately dilated - RV moderately dilated with systolic function moderately reduced  Venous duplex - R LE DVT   Discharge Diagnoses:  Active Problems:   Bilateral pulmonary embolism (HCC)   PE (pulmonary embolism)   Discharge Instructions:  Activity:  As tolerated  Discharge Instructions    Diet - low sodium heart healthy    Complete by:  As directed   Discharge instructions    Complete by:  As directed   Follow with Primary MD at [facility] for a INR check on 8/28.  Please do not take NSAIDs (medications like Aleve, Motrin etc.) while you're on anticoagulation. It is okay to take Tylenol.  Please ask your primary M.D. to refer you to a hematologist    No primary care provider on file.  and other consultant's as instructed  your Hospitalist MD  Please get a complete blood count and chemistry panel checked by your Primary MD at your next visit, and again as instructed by your Primary MD.  Get Medicines reviewed and adjusted: Please take all your medications with you  for your next visit with your Primary MD  Laboratory/radiological data: Please request your Primary MD to go over all hospital tests and procedure/radiological results at the follow up, please ask your Primary MD to get all Hospital records sent to his/her office.  In some cases, they will be blood work, cultures and biopsy results pending at the time of your discharge. Please request that your primary care M.D. follows up on these results.  Also Note the following: If you experience worsening of your admission symptoms, develop shortness of breath, life threatening emergency, suicidal or homicidal thoughts you must seek medical attention immediately by calling 911 or calling your MD immediately  if symptoms less severe.  You must read complete instructions/literature along with all the possible adverse reactions/side effects for all the Medicines you take and that have been prescribed to you. Take any new Medicines after you have completely understood and accpet all the possible adverse reactions/side effects.   Do not drive when taking Pain medications or sleeping medications (Benzodaizepines)  Do not take more than prescribed Pain, Sleep and Anxiety Medications. It is not advisable to combine anxiety,sleep and pain medications without talking with your primary care practitioner  Special Instructions: If you have smoked or chewed Tobacco  in the last 2 yrs please stop smoking, stop any regular Alcohol  and or any Recreational drug use.  Wear Seat belts while driving.  Please note: You were cared for by a hospitalist during your hospital stay. Once you are discharged, your primary care physician will handle any further medical issues. Please note that NO REFILLS for any discharge medications will be authorized once you are discharged, as it is imperative that you return to your primary care physician (or establish a relationship with a primary care physician if you do not have one) for your  post hospital discharge needs so that they can reassess your need for medications and monitor your lab values.   Increase activity slowly    Complete by:  As directed       Medication List    STOP taking these medications   ibuprofen 200 MG tablet Commonly known as:  ADVIL,MOTRIN     TAKE these medications   warfarin 5 MG tablet Commonly known as:  COUMADIN Take 1.5 tablets (7.5 mg total) by mouth daily at 6 PM.      Follow-up Information    [facility]. Call on 03/10/2016.   Why:  Call monday for Hospital follow up-to check INR Contact information: [address] [place] [address] [phone]         No Known Allergies    Consultations:   pulmonary/intensive care   Other Procedures/Studies: Dg Chest 2 View  Result Date: 02/29/2016 CLINICAL DATA:  Shortness of breath for a few days. EXAM: CHEST  2 VIEW COMPARISON:  None. FINDINGS: Cardiac silhouette is enlarged. Prominent right hilar structures which could represent vascular structures. Trachea is midline. No significant pleural effusions. Few linear densities in left lung are suggestive for atelectasis. Negative for a pneumothorax. No acute bone abnormality. IMPRESSION: Cardiomegaly with enlarged central vascular structures. Findings are suggestive for vascular congestion. Fullness in the right hilum and right paratracheal region. These findings could  be related to enlarged vascular structures but difficult to exclude lymphadenopathy based on these two views. Recommend follow-up chest radiograph to ensure stability or further characterization with CT. Electronically Signed   By: Markus Daft M.D.   On: 02/29/2016 12:49   Ct Angio Chest Pe W Or Wo Contrast  Result Date: 02/29/2016 CLINICAL DATA:  Shortness of breath. EXAM: CT ANGIOGRAPHY CHEST WITH CONTRAST TECHNIQUE: Multidetector CT imaging of the chest was performed using the standard protocol during bolus  administration of intravenous contrast. Multiplanar CT image reconstructions and MIPs were obtained to evaluate the vascular anatomy. CONTRAST:  100 mL of Isovue 370 COMPARISON:  Chest x-ray from earlier today FINDINGS: The central airways are within normal limits. No pneumothorax. Scattered atelectasis. There is mixed attenuation of lung parenchyma with areas of lower and higher density. No interlobular septal thickening. No suspicious pulmonary nodules or masses. No evidence of pneumonia. Evaluation of pulmonary arteries is somewhat limited due to patient body habitus. However, there are clearly bilateral pulmonary emboli involving both upper lobes, the right middle lobe, and both lower lobes. The largest burden of emboli is in the right lower lobe. The volume of the right ventricle appears to be mildly greater than the left and there is mild bowing of the intraventricular septum to the left such as on series 6, image 207 suggesting right heart strain. Cardiomegaly is identified. No effusions. Evaluation of the thoracic aorta is limited due to timing of the contrast bolus but no aneurysm is identified. There is a mildly prominent node adjacent to the azygos vein measuring 15 mm, possibly reactive. Recommend attention on follow-up. No other adenopathy. Evaluation of the upper abdomen is limited but normal. Degenerative changes seen in the spine. Review of the MIP images confirms the above findings. IMPRESSION: 1. Bilateral pulmonary emboli most prominent in the right lower lobe with a moderate burden. However, the right ventricular volume is a Koren greater than the left and there is mild bowing of the intraventricular septum to the left suggesting right heart strain. 2. Probable reactive node.  Recommend attention on follow-up. 3. Mixed attenuation of the lung parenchyma could represent mosaic perfusion versus air trapping. Findings called to Dr. Kathrynn Humble Electronically Signed   By: Dorise Bullion III M.D   On:  02/29/2016 15:03      TODAY-DAY OF DISCHARGE:  Subjective:   [place] today has no headache,no chest abdominal pain,no new weakness tingling or numbness, feels much better wants to go home today.   Objective:   Blood pressure 118/86, pulse 64, temperature 97.9 F (36.6 C), temperature source Oral, resp. rate 18, height _0  (1.753 m), weight (!) 191.9 kg (423 lb 1.6 oz), last menstrual period 11/29/2015, SpO2 96 %.  Intake/Output Summary (Last 24 hours) at 03/08/16 1142 Last data filed at 03/08/16 5956  Gross per 24 hour  Intake              892 ml  Output             1350 ml  Net             -458 ml   Filed Weights   03/06/16 0546 03/07/16 0545 03/08/16 0654  Weight: (!) 191.1 kg (421 lb 6.4 oz) (!) 191.3 kg (421 lb 11.2 oz) (!) 191.9 kg (423 lb 1.6 oz)    Exam: Awake Alert, Oriented *3, No new F.N deficits, Normal affect [place].AT,PERRAL Supple Neck,No JVD, No cervical lymphadenopathy appriciated.  Symmetrical Chest wall movement, Good  air movement bilaterally, CTAB RRR,No Gallops,Rubs or new Murmurs, No Parasternal Heave +ve B.Sounds, Abd Soft, Non tender, No organomegaly appriciated, No rebound -guarding or rigidity. No Cyanosis, Clubbing or edema, No new Rash or bruise   PERTINENT RADIOLOGIC STUDIES: Dg Chest 2 View  Result Date: 02/29/2016 CLINICAL DATA:  Shortness of breath for a few days. EXAM: CHEST  2 VIEW COMPARISON:  None. FINDINGS: Cardiac silhouette is enlarged. Prominent right hilar structures which could represent vascular structures. Trachea is midline. No significant pleural effusions. Few linear densities in left lung are suggestive for atelectasis. Negative for a pneumothorax. No acute bone abnormality. IMPRESSION: Cardiomegaly with enlarged central vascular structures. Findings are suggestive for vascular congestion. Fullness in the right hilum and right paratracheal region. These findings could be related to enlarged vascular structures but difficult  to exclude lymphadenopathy based on these two views. Recommend follow-up chest radiograph to ensure stability or further characterization with CT. Electronically Signed   By: Markus Daft M.D.   On: 02/29/2016 12:49   Ct Angio Chest Pe W Or Wo Contrast  Result Date: 02/29/2016 CLINICAL DATA:  Shortness of breath. EXAM: CT ANGIOGRAPHY CHEST WITH CONTRAST TECHNIQUE: Multidetector CT imaging of the chest was performed using the standard protocol during bolus administration of intravenous contrast. Multiplanar CT image reconstructions and MIPs were obtained to evaluate the vascular anatomy. CONTRAST:  100 mL of Isovue 370 COMPARISON:  Chest x-ray from earlier today FINDINGS: The central airways are within normal limits. No pneumothorax. Scattered atelectasis. There is mixed attenuation of lung parenchyma with areas of lower and higher density. No interlobular septal thickening. No suspicious pulmonary nodules or masses. No evidence of pneumonia. Evaluation of pulmonary arteries is somewhat limited due to patient body habitus. However, there are clearly bilateral pulmonary emboli involving both upper lobes, the right middle lobe, and both lower lobes. The largest burden of emboli is in the right lower lobe. The volume of the right ventricle appears to be mildly greater than the left and there is mild bowing of the intraventricular septum to the left such as on series 6, image 207 suggesting right heart strain. Cardiomegaly is identified. No effusions. Evaluation of the thoracic aorta is limited due to timing of the contrast bolus but no aneurysm is identified. There is a mildly prominent node adjacent to the azygos vein measuring 15 mm, possibly reactive. Recommend attention on follow-up. No other adenopathy. Evaluation of the upper abdomen is limited but normal. Degenerative changes seen in the spine. Review of the MIP images confirms the above findings. IMPRESSION: 1. Bilateral pulmonary emboli most prominent in the  right lower lobe with a moderate burden. However, the right ventricular volume is a [place] greater than the left and there is mild bowing of the intraventricular septum to the left suggesting right heart strain. 2. Probable reactive node.  Recommend attention on follow-up. 3. Mixed attenuation of the lung parenchyma could represent mosaic perfusion versus air trapping. Findings called to Dr. Kathrynn Humble Electronically Signed   By: Dorise Bullion III M.D   On: 02/29/2016 15:03     PERTINENT LAB RESULTS: CBC:  Recent Labs  03/07/16 0328 03/08/16 0406  WBC 4.4 4.3  HGB 12.3 12.7  HCT 39.9 40.9  PLT 197 190   CMET CMP     Component Value Date/Time   NA 138 03/01/2016 0255   K 4.7 03/01/2016 0255   CL 104 03/01/2016 0255   CO2 28 03/01/2016 0255   GLUCOSE 133 (H) 03/01/2016 0255  BUN 13 03/01/2016 0255   CREATININE 1.08 (H) 03/01/2016 0255   CALCIUM 9.2 03/01/2016 0255   PROT 7.4 03/01/2016 0255   ALBUMIN 3.2 (L) 03/01/2016 0255   AST 16 03/01/2016 0255   ALT 12 (L) 03/01/2016 0255   ALKPHOS 54 03/01/2016 0255   BILITOT 0.5 03/01/2016 0255   GFRNONAA 59 (L) 03/01/2016 0255   GFRAA >60 03/01/2016 0255    GFR Estimated Creatinine Clearance: 114.6 mL/min (by C-G formula based on SCr of 1.08 mg/dL). No results for input(s): LIPASE, AMYLASE in the last 72 hours. No results for input(s): CKTOTAL, CKMB, CKMBINDEX, TROPONINI in the last 72 hours. Invalid input(s): POCBNP No results for input(s): DDIMER in the last 72 hours. No results for input(s): HGBA1C in the last 72 hours. No results for input(s): CHOL, HDL, LDLCALC, TRIG, CHOLHDL, LDLDIRECT in the last 72 hours. No results for input(s): TSH, T4TOTAL, T3FREE, THYROIDAB in the last 72 hours.  Invalid input(s): FREET3 No results for input(s): VITAMINB12, FOLATE, FERRITIN, TIBC, IRON, RETICCTPCT in the last 72 hours. Coags:  Recent Labs  03/07/16 0328 03/08/16 0337  INR 1.74 2.07   Microbiology: Recent Results (from the  past 240 hour(s))  MRSA PCR Screening     Status: None   Collection Time: 02/29/16  5:37 PM  Result Value Ref Range Status   MRSA by PCR NEGATIVE NEGATIVE Final    Comment:        The GeneXpert MRSA Assay (FDA approved for NASAL specimens only), is one component of a comprehensive MRSA colonization surveillance program. It is not intended to diagnose MRSA infection nor to guide or monitor treatment for MRSA infections.     FURTHER DISCHARGE INSTRUCTIONS:  Get Medicines reviewed and adjusted: Please take all your medications with you for your next visit with your Primary MD  Laboratory/radiological data: Please request your Primary MD to go over all hospital tests and procedure/radiological results at the follow up, please ask your Primary MD to get all Hospital records sent to his/her office.  In some cases, they will be blood work, cultures and biopsy results pending at the time of your discharge. Please request that your primary care M.D. goes through all the records of your hospital data and follows up on these results.  Also Note the following: If you experience worsening of your admission symptoms, develop shortness of breath, life threatening emergency, suicidal or homicidal thoughts you must seek medical attention immediately by calling 911 or calling your MD immediately  if symptoms less severe.  You must read complete instructions/literature along with all the possible adverse reactions/side effects for all the Medicines you take and that have been prescribed to you. Take any new Medicines after you have completely understood and accpet all the possible adverse reactions/side effects.   Do not drive when taking Pain medications or sleeping medications (Benzodaizepines)  Do not take more than prescribed Pain, Sleep and Anxiety Medications. It is not advisable to combine anxiety,sleep and pain medications without talking with your primary care practitioner  Special  Instructions: If you have smoked or chewed Tobacco  in the last 2 yrs please stop smoking, stop any regular Alcohol  and or any Recreational drug use.  Wear Seat belts while driving.  Please note: You were cared for by a hospitalist during your hospital stay. Once you are discharged, your primary care physician will handle any further medical issues. Please note that NO REFILLS for any discharge medications will be authorized once you are discharged, as  it is imperative that you return to your primary care physician (or establish a relationship with a primary care physician if you do not have one) for your post hospital discharge needs so that they can reassess your need for medications and monitor your lab values.  Total Time spent coordinating discharge including counseling, education and face to face time equals 35 minutes.  SignedOren Binet 03/08/2016 11:42 AM

## 2016-03-08 NOTE — Progress Notes (Signed)
Patient is discharge to home accompanied by patient's niece. Prescriptions and discharge instructions given . Patient verbalizes understanding. All personal belongings given. Telemetry box and IV removed prior to discharge and site in good condition.

## 2016-03-08 NOTE — Progress Notes (Signed)
ANTICOAGULATION CONSULT NOTE - Follow Up Consult  Pharmacy Consult for Heparin and Warfarin Indication: pulmonary embolus, new onset  No Known Allergies  Patient Measurements: Height: 5' 9" (175.3 cm) Weight: (!) 421 lb 11.2 oz (191.3 kg) (scale b) IBW/kg (Calculated) : 66.2  Heparin dosing wt: 116 kg  Vital Signs: Temp: 98.1 F (36.7 C) (08/25 2021) Temp Source: Oral (08/25 2021) BP: 133/77 (08/25 2021) Pulse Rate: 66 (08/25 2021)  Labs:  Recent Labs  03/06/16 0547 03/07/16 0328 03/08/16 0337 03/08/16 0406  HGB 13.6 12.3  --  12.7  HCT 43.9 39.9  --  40.9  PLT 201 197  --  190  LABPROT 16.6* 20.6* 23.6*  --   INR 1.33 1.74 2.07  --   HEPARINUNFRC 0.65 0.67 0.76*  --     Estimated Creatinine Clearance: 114.3 mL/min (by C-G formula based on SCr of 1.08 mg/dL).  Assessment: Continues on heparin to warfarin bridge for bilateral PE. Heparin level up to slightly supratherapeutic (0.76) on 2150 units/hr. No bleeding noted, CBC is stable. No line issues per RN.  Today is day 6 of heparin to Coumadin bridge. INR up to 2.07 - will continue bridge until INR >= 2 for 24 hours - hopefully can d/c heparin tomorrow.  Goal of Therapy:  INR 2-3 Heparin level 0.3-0.7 units/ml Monitor platelets by anticoagulation protocol: Yes   Plan:  -Decrease heparin drip to 2000 units/hr -6 hr HL -Daily HL, INR, CBC, INR -Coumadin 54m again tonight  CSherlon Handing PharmD, BCPS Clinical pharmacist, pager [phone]/26/2017 5:01 AM

## 2016-03-08 NOTE — Progress Notes (Signed)
ANTICOAGULATION CONSULT NOTE - Follow Up Consult  Pharmacy Consult for Heparin and Warfarin Indication: pulmonary embolus, new onset  No Known Allergies  Patient Measurements: Height: _0  (175.3 cm) Weight: (!) 423 lb 1.6 oz (191.9 kg) (scale b) IBW/kg (Calculated) : 66.2  Heparin dosing wt: 116 kg  Vital Signs: Temp: 97.7 F (36.5 C) (08/26 0654) Temp Source: Oral (08/26 0654) BP: 119/81 (08/26 0654) Pulse Rate: 70 (08/26 0654)  Labs:  Recent Labs  03/06/16 0547 03/07/16 0328 03/08/16 0337 03/08/16 0406  HGB 13.6 12.3  --  12.7  HCT 43.9 39.9  --  40.9  PLT 201 197  --  190  LABPROT 16.6* 20.6* 23.6*  --   INR 1.33 1.74 2.07  --   HEPARINUNFRC 0.65 0.67 0.76*  --     Estimated Creatinine Clearance: 114.6 mL/min (by C-G formula based on SCr of 1.08 mg/dL).  Assessment: Continues on heparin to warfarin bridge for bilateral PE. Heparin level up to slightly supratherapeutic (0.76) on 2150 units/hr. No bleeding noted, CBC is stable. No line issues per RN.  Today is day 6 of heparin to Coumadin bridge. INR up to 2.07 - will give one dose of Lovenox for bridge in anticipation of dc home  Coumadin 7.5 mg po daily at discharge with follow up INR Monday or Tuesday   Goal of Therapy:  INR 2-3 Heparin level 0.3-0.7 units/ml Monitor platelets by anticoagulation protocol: Yes   Plan:  Lovenox 200 mg sq x 1 dose Coumadin 7.5 mg po daily at discharge  Thank you Anette Guarneri, PharmD [phone]  03/08/2016 11:22 AM

## 2016-03-10 ENCOUNTER — Ambulatory Visit: Payer: Medicaid Other | Attending: Internal Medicine | Admitting: Pharmacist

## 2016-03-10 DIAGNOSIS — I2699 Other pulmonary embolism without acute cor pulmonale: Secondary | ICD-10-CM

## 2016-03-10 LAB — POCT INR: INR: 2.9

## 2016-03-19 ENCOUNTER — Ambulatory Visit: Payer: Medicaid Other

## 2016-03-19 ENCOUNTER — Ambulatory Visit: Payer: Medicaid Other | Attending: Internal Medicine | Admitting: Internal Medicine

## 2016-03-19 ENCOUNTER — Encounter: Payer: Self-pay | Admitting: Internal Medicine

## 2016-03-19 VITALS — BP 136/84 | HR 71 | Temp 97.7°F | Wt >= 6400 oz

## 2016-03-19 DIAGNOSIS — Z7901 Long term (current) use of anticoagulants: Secondary | ICD-10-CM | POA: Insufficient documentation

## 2016-03-19 DIAGNOSIS — E119 Type 2 diabetes mellitus without complications: Secondary | ICD-10-CM | POA: Diagnosis not present

## 2016-03-19 DIAGNOSIS — I2699 Other pulmonary embolism without acute cor pulmonale: Secondary | ICD-10-CM | POA: Diagnosis present

## 2016-03-19 DIAGNOSIS — Z6841 Body Mass Index (BMI) 40.0 and over, adult: Secondary | ICD-10-CM | POA: Insufficient documentation

## 2016-03-19 DIAGNOSIS — G4710 Hypersomnia, unspecified: Secondary | ICD-10-CM

## 2016-03-19 DIAGNOSIS — Z86718 Personal history of other venous thrombosis and embolism: Secondary | ICD-10-CM | POA: Insufficient documentation

## 2016-03-19 DIAGNOSIS — Z87891 Personal history of nicotine dependence: Secondary | ICD-10-CM | POA: Insufficient documentation

## 2016-03-19 DIAGNOSIS — R0683 Snoring: Secondary | ICD-10-CM | POA: Diagnosis not present

## 2016-03-19 DIAGNOSIS — Z131 Encounter for screening for diabetes mellitus: Secondary | ICD-10-CM

## 2016-03-19 DIAGNOSIS — Z86711 Personal history of pulmonary embolism: Secondary | ICD-10-CM | POA: Diagnosis not present

## 2016-03-19 DIAGNOSIS — Z23 Encounter for immunization: Secondary | ICD-10-CM | POA: Insufficient documentation

## 2016-03-19 DIAGNOSIS — R400 Somnolence: Secondary | ICD-10-CM

## 2016-03-19 DIAGNOSIS — I82431 Acute embolism and thrombosis of right popliteal vein: Secondary | ICD-10-CM

## 2016-03-19 LAB — CBC WITH DIFFERENTIAL/PLATELET
BASOS PCT: 2 %
Basophils Absolute: 76 cells/uL (ref 0–200)
Eosinophils Absolute: 152 cells/uL (ref 15–500)
Eosinophils Relative: 4 %
HEMATOCRIT: 41.5 % (ref 35.0–45.0)
HEMOGLOBIN: 13.3 g/dL (ref 11.7–15.5)
LYMPHS ABS: 1520 {cells}/uL (ref 850–3900)
Lymphocytes Relative: 40 %
MCH: 27.5 pg (ref 27.0–33.0)
MCHC: 32 g/dL (ref 32.0–36.0)
MCV: 85.9 fL (ref 80.0–100.0)
MONO ABS: 304 {cells}/uL (ref 200–950)
MPV: 12.7 fL — AB (ref 7.5–12.5)
Monocytes Relative: 8 %
Neutro Abs: 1748 cells/uL (ref 1500–7800)
Neutrophils Relative %: 46 %
Platelets: 257 10*3/uL (ref 140–400)
RBC: 4.83 MIL/uL (ref 3.80–5.10)
RDW: 15.2 % — ABNORMAL HIGH (ref 11.0–15.0)
WBC: 3.8 10*3/uL (ref 3.8–10.8)

## 2016-03-19 LAB — BASIC METABOLIC PANEL WITH GFR
BUN: 11 mg/dL (ref 7–25)
CHLORIDE: 102 mmol/L (ref 98–110)
CO2: 28 mmol/L (ref 20–31)
Calcium: 9.2 mg/dL (ref 8.6–10.4)
Creat: 0.94 mg/dL (ref 0.50–1.05)
GFR, Est African American: 82 mL/min (ref >=60)
GFR, Est Non African American: 71 mL/min (ref >=60)
Glucose, Bld: 82 mg/dL (ref 65–99)
POTASSIUM: 4.2 mmol/L (ref 3.5–5.3)
Sodium: 138 mmol/L (ref 135–146)

## 2016-03-19 LAB — PROTIME-INR
INR: 3.9 — ABNORMAL HIGH
Prothrombin Time: 39 s — ABNORMAL HIGH (ref 9.0–11.5)

## 2016-03-19 LAB — TSH: TSH: 1.56 m[IU]/L

## 2016-03-19 NOTE — Patient Instructions (Signed)
- financial aid packet.  -weight loss recommendations; Low carbohydrates diet , recommend < 30-50gram daily. Ok on protein and healthy fats (avocado, etc) Increase water intake Increase exercise/walk daily.    Exercising to Lose Weight Exercising can help you to lose weight. In order to lose weight through exercise, you need to do vigorous-intensity exercise. You can tell that you are exercising with vigorous intensity if you are breathing very hard and fast and cannot hold a conversation while exercising. Moderate-intensity exercise helps to maintain your current weight. You can tell that you are exercising at a moderate level if you have a higher heart rate and faster breathing, but you are still able to hold a conversation. HOW OFTEN SHOULD I EXERCISE? Choose an activity that you enjoy and set realistic goals. Your health care provider can help you to make an activity plan that works for you. Exercise regularly as directed by your health care provider. This may include:  Doing resistance training twice each week, such as:  Push-ups.  Sit-ups.  Lifting weights.  Using resistance bands.  Doing a given intensity of exercise for a given amount of time. Choose from these options:  150 minutes of moderate-intensity exercise every week.  75 minutes of vigorous-intensity exercise every week.  A mix of moderate-intensity and vigorous-intensity exercise every week. Children, pregnant women, people who are out of shape, people who are overweight, and older adults may need to consult a health care provider for individual recommendations. If you have any sort of medical condition, be sure to consult your health care provider before starting a new exercise program. WHAT ARE SOME ACTIVITIES THAT CAN HELP ME TO LOSE WEIGHT?   Walking at a rate of at least 4.5 miles an hour.  Jogging or running at a rate of 5 miles per hour.  Biking at a rate of at least 10 miles per hour.  Lap  swimming.  Roller-skating or in-line skating.  Cross-country skiing.  Vigorous competitive sports, such as football, basketball, and soccer.  Jumping rope.  Aerobic dancing. HOW CAN I BE MORE ACTIVE IN MY DAY-TO-DAY ACTIVITIES?  Use the stairs instead of the elevator.  Take a walk during your lunch break.  If you drive, park your car farther away from work or school.  If you take public transportation, get off one stop early and walk the rest of the way.  Make all of your phone calls while standing up and walking around.  Get up, stretch, and walk around every 30 minutes throughout the day. WHAT GUIDELINES SHOULD I FOLLOW WHILE EXERCISING?  Do not exercise so much that you hurt yourself, feel dizzy, or get very short of breath.  Consult your health care provider prior to starting a new exercise program.  Wear comfortable clothes and shoes with good support.  Drink plenty of water while you exercise to prevent dehydration or heat stroke. Body water is lost during exercise and must be replaced.  Work out until you breathe faster and your heart beats faster.   This information is not intended to replace advice given to you by your health care provider. Make sure you discuss any questions you have with your health care provider.   Document Released: 08/02/2010 Document Revised: 07/21/2014 Document Reviewed: 12/01/2013 Elsevier Interactive Patient Education 2016 Elsevier Inc.    Sleep Apnea Sleep apnea is disorder that affects a person's sleep. A person with sleep apnea has abnormal pauses in their breathing when they sleep. It is hard for them to  get a good sleep. This makes a person tired during the day. It also can lead to other physical problems. There are three types of sleep apnea. One type is when breathing stops for a short time because your airway is blocked (obstructive sleep apnea). Another type is when the brain sometimes fails to give the normal signal to breathe  to the muscles that control your breathing (central sleep apnea). The third type is a combination of the other two types. HOME CARE  Do not sleep on your back. Try to sleep on your side.  Take all medicine as told by your doctor.  Avoid alcohol, calming medicines (sedatives), and depressant drugs.  Try to lose weight if you are overweight. Talk to your doctor about a healthy weight goal. Your doctor may have you use a device that helps to open your airway. It can help you get the air that you need. It is called a positive airway pressure (PAP) device. There are three types of PAP devices:  Continuous positive airway pressure (CPAP) device.  Nasal expiratory positive airway pressure (EPAP) device.  Bilevel positive airway pressure (BPAP) device. MAKE SURE YOU:  Understand these instructions.  Will watch your condition.  Will get help right away if you are not doing well or get worse.   This information is not intended to replace advice given to you by your health care provider. Make sure you discuss any questions you have with your health care provider.   Document Released: 04/08/2008 Document Revised: 07/21/2014 Document Reviewed: 11/01/2011 Elsevier Interactive Patient Education [employer].

## 2016-03-19 NOTE — Progress Notes (Signed)
Angel Brewer, is a 50 y.o. female  [id number]:[id number]  [id number]  DOB - 06-30-1966  CC:  Chief Complaint  Patient presents with  . Hospitalization Follow-up       HPI: Angel Brewer is a 50 y.o. female here today to establish medical care, new from [place], here in [place] since Dec 2016, recent dc from hospital 8/18-8/26/17 for acute hypoxic resp failure 2nd to bilateral pe and subacute RLE dvt.  Pt states they drove nonstop to [place] July 1-8, and she stayed in the [place] the entire time.    She does not smoke, denies any medical history. +daytime somnolence, snoring.  Lives sedentary lifestyle.  Per pt, her niece and mom had blood clots, but she does not know why.  Patient has No headache, No chest pain, No abdominal pain - No Nausea, No new weakness tingling or numbness, No Cough - SOB.    Review of Systems: Per HPI, o/w all systems reviewed and negative.   No Known Allergies Past Medical History:  Diagnosis Date  . Morbid obesity ([place])    Current Outpatient Prescriptions on File Prior to Visit  Medication Sig Dispense Refill  . warfarin (COUMADIN) 5 MG tablet Take 1.5 tablets (7.5 mg total) by mouth daily at 6 PM. 90 tablet 0   No current facility-administered medications on file prior to visit.    No family history on file. Social History   Social History  . Marital status: Single    Spouse name: N/A  . Number of children: N/A  . Years of education: N/A   Occupational History  . Not on file.   Social History Main Topics  . Smoking status: Former Research scientist (life sciences)  . Smokeless tobacco: Never Used  . Alcohol use No  . Drug use: No  . Sexual activity: Not on file   Other Topics Concern  . Not on file   Social History Narrative  . No narrative on file    Objective:   Vitals:   03/19/16 1019  BP: 136/84  Pulse: 71  Temp: 97.7 F (36.5 C)    Filed Weights   03/19/16 1019  Weight: (!) 425 lb 12.8 oz (193.1 kg)    BP Readings from Last 3  Encounters:  03/19/16 136/84  03/08/16 118/86   bmi 63  Physical Exam: Constitutional: Patient appears well-developed and well-nourished. No distress. AAOx3, extreme morbid obesity. HENT: Normocephalic, atraumatic, External right and left ear normal. Oropharynx is clear and moist.  Eyes: Conjunctivae and EOM are normal. PERRL, no scleral icterus. Neck: Normal ROM. Neck supple. No JVD.  CVS: RRR, S1/S2 +, no murmurs, no gallops, no carotid bruit.  Pulmonary: Effort and breath sounds normal, no stridor, rhonchi, wheezes, rales.  Abdominal: Soft. BS +, no distension, tenderness, rebound or guarding.  Musculoskeletal: Normal range of motion. No edema and no tenderness.  LE: bilat/ no c/c, trace edema bilat, neg homan's sign bilaterally, scaling/dry legs, pulses 2+ bilateral. Neuro: Alert.  muscle tone coordination wnl. No cranial nerve deficit grossly. Skin: Skin is warm and dry. No rash noted. Not diaphoretic. No erythema. No pallor. Psychiatric: Normal mood and affect. Behavior, judgment, thought content normal.  Lab Results  Component Value Date   WBC 4.3 03/08/2016   HGB 12.7 03/08/2016   HCT 40.9 03/08/2016   MCV 89.3 03/08/2016   PLT 190 03/08/2016   Lab Results  Component Value Date   CREATININE 1.08 (H) 03/01/2016   BUN 13 03/01/2016   NA 138 03/01/2016  K 4.7 03/01/2016   CL 104 03/01/2016   CO2 28 03/01/2016    No results found for: HGBA1C Lipid Panel  No results found for: CHOL, TRIG, HDL, CHOLHDL, VLDL, LDLCALC     No flowsheet data found.   02/29/16 CT angio  IMPRESSION: 1. Bilateral pulmonary emboli most prominent in the right lower lobe with a moderate burden. However, the right ventricular volume is a Pardy greater than the left and there is mild bowing of the intraventricular septum to the left suggesting right heart strain. 2. Probable reactive node.  Recommend attention on follow-up. 3. Mixed attenuation of the lung parenchyma could represent  mosaic perfusion versus air trapping. Findings called to Dr. Kathrynn Humble   03/01/16 bilat le [place] Summary: Findings consistent with subacute deep vein thrombosis involving the popliteal vein of the right lower extremity.  Other specific details can be found in the table(s) above. Prepared and Electronically Authenticated by  Adele Barthel, MD 2017-08-20T18:42:37  -  03/02/16 echo Study Conclusions  - Left ventricle: The cavity size was normal. There was mild   concentric hypertrophy. Systolic function was normal. The   estimated ejection fraction was in the range of 50% to 55%. Wall   motion was normal; there were no regional wall motion   abnormalities. - Left atrium: The atrium was moderately dilated. - Right ventricle: The cavity size was moderately dilated. Wall   thickness was normal. Systolic function was moderately reduced. - Right atrium: The atrium was moderately dilated. - Pulmonary arteries: Systolic pressure was mildly increased. PA   peak pressure: 35 mm Hg (S). Assessment and plan:   1. Bilateral pulmonary embolism ([place]) - going to our coumadin clinic. - suspect due to long lengthy drive to [facility] in June as cause of dvt. However, no Hypercoaguable workup done prior to initiating coumadin - hemeonce referral once pt gets [facility] discount/orange card - Protime-INR - BASIC METABOLIC PANEL WITH GFR - CBC with Differential/Platelet  2. Sub-Acute deep vein thrombosis (DVT) of popliteal vein of right lower extremity (HCC) On coumadin  3. Morbid obesity, unspecified obesity type ([place]) - TSH - recd low carb diet (<30-50gm daily), increase exercise/walking, ok on proteins/healthy fats for now  4. Daytime somnolence Suspect osa, needs sleep study as soon as gets financial aid  5. Flu vaccine need - Flu Vaccine QUAD 36+ mos PF IM (Fluarix & Fluzone Quad PF)  6. Diabetes mellitus screening - HgB A1c  - unfortunately was missed, will do next time.  7. tdap  today.  Return in about 4 weeks (around 04/16/2016) for pulmonary embolism.  The patient was given clear instructions to go to ER or return to medical center if symptoms don't improve, worsen or new problems develop. The patient verbalized understanding. The patient was told to call to get lab results if they haven't heard anything in the next week.    This note has been created with Surveyor, quantity. Any transcriptional errors are unintentional.   Maren Reamer, MD, [place], [place]   03/19/2016, 10:42 AM

## 2016-03-20 ENCOUNTER — Telehealth: Payer: Self-pay | Admitting: Pharmacist

## 2016-03-20 NOTE — Telephone Encounter (Signed)
INR yesterday 3.9. Called patient and instructed her to only take 1 tablet today and tomorrow instead of her usual 1.5 tablets. I want to be conservative to prevent her from going subtherapeutic so close to the clotting event. Schedule appointment for patient to follow up with me on Tuesday 03/25/16. Patient denies any bleeding at this time and verbalized understanding of instructions.

## 2016-03-21 ENCOUNTER — Telehealth: Payer: Self-pay | Admitting: *Deleted

## 2016-03-21 NOTE — Telephone Encounter (Signed)
Patient verified DOB Patient is aware of lab results being normal including renal function, thyroid and blood count. Patient expressed her understanding and had no further questions at this time.

## 2016-03-21 NOTE — Telephone Encounter (Signed)
-----  Message from Maren Reamer, MD sent at 03/20/2016  1:12 PM EDT ----- Please call. Renal fn nmkl. And thyroid fn nml. Blood count nml,  thx

## 2016-03-25 ENCOUNTER — Telehealth: Payer: Self-pay | Admitting: Internal Medicine

## 2016-03-25 ENCOUNTER — Ambulatory Visit: Payer: Medicaid Other | Attending: Internal Medicine | Admitting: Pharmacist

## 2016-03-25 DIAGNOSIS — R400 Somnolence: Secondary | ICD-10-CM

## 2016-03-25 DIAGNOSIS — I2699 Other pulmonary embolism without acute cor pulmonale: Secondary | ICD-10-CM

## 2016-03-25 LAB — POCT INR: INR: 3

## 2016-03-25 NOTE — Telephone Encounter (Signed)
Will forward to pcp

## 2016-03-25 NOTE — Telephone Encounter (Signed)
Pt now has [place] medicaid and is able to be referred out, please f/up

## 2016-03-25 NOTE — Telephone Encounter (Signed)
Please call. I put in order for sleep study to r/o OSA. Please help pt make appt. thanks

## 2016-03-26 NOTE — Telephone Encounter (Signed)
Contacted pt to make her that Dr. Janne Napoleon has put her sleep study referral in and if she has any questions

## 2016-04-01 ENCOUNTER — Ambulatory Visit: Payer: Medicaid Other | Attending: Internal Medicine | Admitting: Pharmacist

## 2016-04-01 DIAGNOSIS — I2699 Other pulmonary embolism without acute cor pulmonale: Secondary | ICD-10-CM

## 2016-04-01 LAB — POCT INR: INR: 2.3

## 2016-04-01 NOTE — Patient Instructions (Addendum)
Thanks for coming to see [place] today! Take acetaminophen for pain, avoid NSAIDs like ibuprofen (advil) or naproxen (aleve).

## 2016-04-15 ENCOUNTER — Encounter (INDEPENDENT_AMBULATORY_CARE_PROVIDER_SITE_OTHER): Payer: Self-pay

## 2016-04-15 ENCOUNTER — Ambulatory Visit: Payer: Medicaid Other | Attending: Internal Medicine | Admitting: Pharmacist

## 2016-04-15 DIAGNOSIS — I2699 Other pulmonary embolism without acute cor pulmonale: Secondary | ICD-10-CM

## 2016-04-15 LAB — POCT INR: INR: 2.5

## 2016-04-15 NOTE — Telephone Encounter (Signed)
I don't see a sleep study her pcp need to put the order in epic no referral and the sleep study will call her to schedule an appointment . [facility]  PH# [phone]  [place]

## 2016-04-15 NOTE — Telephone Encounter (Signed)
Pt. Called requesting to Know about her sleep study appointment. Please f/u

## 2016-04-15 NOTE — Telephone Encounter (Signed)
Angel Russell do you have record to where pt is referred to?

## 2016-04-15 NOTE — Telephone Encounter (Signed)
Thank you ma'am I will let pcp and patient know

## 2016-05-06 ENCOUNTER — Ambulatory Visit: Payer: Medicaid Other | Attending: Internal Medicine | Admitting: Pharmacist

## 2016-05-06 DIAGNOSIS — I2699 Other pulmonary embolism without acute cor pulmonale: Secondary | ICD-10-CM

## 2016-05-06 LAB — POCT INR: INR: 2.3

## 2016-05-27 ENCOUNTER — Telehealth: Payer: Self-pay | Admitting: Internal Medicine

## 2016-05-27 MED ORDER — WARFARIN SODIUM 5 MG PO TABS: 5.0000 mg | ORAL_TABLET | Freq: Every day | ORAL | 2 refills | Status: DC

## 2016-05-27 NOTE — Telephone Encounter (Signed)
Patient called office to request medication refill for warfarin (COUMADIN) 5 MG tablet. Please call rx to [facility] on [address].   Thank you.

## 2016-05-27 NOTE — Telephone Encounter (Signed)
Warfarin refilled.

## 2016-06-10 ENCOUNTER — Encounter: Payer: Self-pay | Admitting: Internal Medicine

## 2016-06-10 ENCOUNTER — Encounter: Payer: Medicaid Other | Admitting: Pharmacist

## 2016-06-10 ENCOUNTER — Ambulatory Visit: Payer: Medicaid Other | Attending: Internal Medicine | Admitting: Internal Medicine

## 2016-06-10 ENCOUNTER — Ambulatory Visit: Payer: Medicaid Other | Admitting: Pharmacist

## 2016-06-10 VITALS — BP 149/99 | HR 76 | Temp 98.1°F | Wt >= 6400 oz

## 2016-06-10 DIAGNOSIS — Z131 Encounter for screening for diabetes mellitus: Secondary | ICD-10-CM

## 2016-06-10 DIAGNOSIS — Z1231 Encounter for screening mammogram for malignant neoplasm of breast: Secondary | ICD-10-CM

## 2016-06-10 DIAGNOSIS — Z1239 Encounter for other screening for malignant neoplasm of breast: Secondary | ICD-10-CM

## 2016-06-10 DIAGNOSIS — Z7901 Long term (current) use of anticoagulants: Secondary | ICD-10-CM | POA: Insufficient documentation

## 2016-06-10 DIAGNOSIS — L600 Ingrowing nail: Secondary | ICD-10-CM | POA: Diagnosis not present

## 2016-06-10 DIAGNOSIS — I2699 Other pulmonary embolism without acute cor pulmonale: Secondary | ICD-10-CM

## 2016-06-10 DIAGNOSIS — R400 Somnolence: Secondary | ICD-10-CM | POA: Insufficient documentation

## 2016-06-10 DIAGNOSIS — I10 Essential (primary) hypertension: Secondary | ICD-10-CM | POA: Diagnosis not present

## 2016-06-10 LAB — POCT GLYCOSYLATED HEMOGLOBIN (HGB A1C): HEMOGLOBIN A1C: 5.4

## 2016-06-10 LAB — POCT INR: INR: 1.3

## 2016-06-10 MED ORDER — HYDROCHLOROTHIAZIDE 25 MG PO TABS: 25.0000 mg | ORAL_TABLET | Freq: Every day | ORAL | 3 refills | Status: DC

## 2016-06-10 NOTE — Patient Instructions (Signed)
QUICK START PATIENT GUIDE TO LCHF/IF LOW CARB HIGH FAT / INTERMITTENT FASTING  Recommend: <50 gram carbohydrate a day for weightloss.  What is this diet and how does it work? o Insulin is a hormone made by your body that allows you to use sugar (glucose) from carbohydrates in the food you eat for energy or to store glucose (as fat) for future use  o Insulin levels need to be lowered in order to utilize our stored energy (fat) o Many struggling with obesity are insulin resistant and have high levels of insulin o This diet works to lower your insulin in two ways o Fasting - allows your insulin levels to naturally decrease  o Avoiding carbohydrates - carbs trigger increase in insulin Low Carb Healthy Fat (LCHF) o Get a free app for your phone, such as MyFitnessPal, to help you track your macronutrients (carbs/protein/fats) and to track your weight and body measurements to see your progress o Set your goal for around 10% carbs/20% protein/70% fat o A good starting goal for amount of net carbs per day is 50 grams (some will aim for 20 grams) o "Net carbs" refers to total grams of carbs minus grams of fiber (as fiber is not typically absorbed). For example, if a food has 5g total carb and 3g fiber, that would be 2g net carbs o Increase healthy fats - eg. olive oil, eggs, nuts, avocado, cheese, butter, coconut, meats, fish o Avoid high carb foods - eg. bread, pasta, potatoes, rice, cookies, soda, juice, anything sugary o Buy full-fat ingredients (avoid low-fat versions, which often have more sugar) o No need to count calories, but pay close attention to grams of carbs on labels Intermittent Fasting (IF) o "Fasting" is going a period of time without eating - it helps to stay busy and well-hydrated o Purpose of fasting is to allow insulin levels to drop as low as possible, allowing your body to switch into fat-burning mode o With this diet there are many approaches to fasting, but 16:8 and 24hr fasts  are commonly used o 16:8 fast, usually 5-7 days a week - Fasting for 16 hours of the day, then eating all meals for the day over course of 8 hours. o 24 hour fast, usually 1-3 days a week - Typically eating one meal a day, then fasting until the next day. Plenty of fluids (and some salt to help you hold onto fluids) are recommended during longer fasts.  o During fasts certain beverages are still acceptable - water, sparkling water, bone broth, black tea or coffee, or tea/coffee with small amount of heavy whipping cream Special note for those on diabetic medications o Discuss your medications with your physician. You may need to hold your medication or adjust to only taking when eating. Diabetics should keep close track of their blood sugars when making any changes to diet/meds, to ensure they are staying within normal limits For more info about LCHF/IF o Watch "Therapeutic Fasting - Solving the Two-Compartment Problem" video by Dr. Sharman Cheek on YouTube (GreatestGyms.com.ee) for a great intro to these concepts o Read "The Obesity Code" and/or "The Complete Guide to Fasting" by Dr. Sharman Cheek o Go to www.dietdoctor.com for explanations, recipes, and infographics about foods to eat/avoid o Get a Free smartphone app that helps count carbohydrates  - ie MyKeto EXAMPLES TO GET STARTED Fasting Beverages -water (can add  tsp [place] salt once or twice a day to help stay hydrated for longer fasts) -Sparkling water (such as  [employer] or similar; avoid any with artificial sweeteners)  -Bone broth (multiple recipes available online or can buy pre-made) -Tea or Coffee (Adding heavy whipping cream or coconut oil to your tea or coffee can be helpful if you find yourself getting too hungry during the fasts. Can also add cinnamon for flavor. Or "bulletproof coffee.") Low Carb Healthy Fat Breakfast (if not fasting) -eggs in butter or olive oil with avocado -omelet with veggies and  cheese  Lunch -hamburger with cheese and avocado wrapped in lettuce (no bun, no ketchup) -meat and cheese wrapped in lettuce (can dip in mustard or olive oil/vinegar/mayo) -salad with meat/cheese/nuts and higher fat dressing (vinaigrette or Ranch, etc) -tuna salad lettuce wrap -taco meat with cheese, sour cream, guacamole, cheese over lettuce  Dinner -steak with herb butter or Barnaise sauce -"Fathead" pizza (uses cheese and almond flour for the dough - several recipes available online) -roasted or grilled chicken with skin on, with low carb sauce (buffalo, garlic butter, alfredo, pesto, etc) -baked salmon with lemon butter -chicken alfredo with zucchini noodles -[place] butter chicken with low carb garlic naan -egg roll in a bowl  Side Dishes -mashed cauliflower (homemade or available in freezer section) -roast vegetables (green veggies that grow above ground rather than root veggies) with butter or cheese -Caprese salad (fresh mozzarella, tomato and basil with olive oil) -homemade low-carb coleslaw Snacks/Desserts (try to avoid unnecessary snacking and sweets in general) -celery or cucumber dipped in guacamole or sour cream dip -cheese and meat slices  -raspberries with whipped cream (can make homemade with no sugar added) -low carb [place] butter cake  AVOID - sugar, diet/regular soda, potatoes, breads, rice, pasta, candy, cookies, cakes, muffins, juice, high carb fruit (bananas, grapes), beer, ketchup, barbeque and other sweet sauces

## 2016-06-10 NOTE — Progress Notes (Signed)
Angel Brewer, is a 50 y.o. female  WCB:[id number]  OHY:[id number]  DOB - [birth year]  Chief Complaint  Patient presents with  . Referral        Subjective:   Angel Brewer is a 50 y.o. female here today for a follow up visit for bilat PE.  Doing well on coumadin, no complications noted.  Denies cp/sob/doe.  Denies hx of htn, but amendable to starting blood pressure pills if need.  Patient has No headache, No chest pain, No abdominal pain - No Nausea, No new weakness tingling or numbness, No Cough - SOB.  No problems updated.  ALLERGIES: No Known Allergies  PAST MEDICAL HISTORY: Past Medical History:  Diagnosis Date  . Morbid obesity ([place])     MEDICATIONS AT HOME: Prior to Admission medications   Medication Sig Start Date End Date Taking? Authorizing Provider  warfarin (COUMADIN) 5 MG tablet Take 1 tablet (5 mg total) by mouth daily at 6 PM. 05/27/16  Yes Maren Reamer, MD     Objective:   Vitals:   06/10/16 1621  BP: (!) 149/99  Pulse: 76  Temp: 98.1 F (36.7 C)  TempSrc: Oral  SpO2: 94%  Weight: (!) 428 lb 9.6 oz (194.4 kg)    Exam General appearance : Awake, alert, not in any distress. Speech Clear. Not toxic looking, super morbid obese HEENT: Atraumatic and Normocephalic, pupils equally reactive to light. Neck: supple, no JVD. Chest:Good air entry bilaterally, no added sounds. CVS: S1 S2 regular, no murmurs/gallups or rubs. Abdomen: Bowel sounds active, obese, Non tender and not distended with no gaurding, rigidity or rebound. Extremities: B/L Lower Ext shows no edema, both legs are warm to touch. Small ingrown toenail, large toe, left foot, no signs of foot infection noted. Neurology: Awake alert, and oriented X 3, CN II-XII grossly intact, Non focal Skin:No Rash  Data Review No results found for: HGBA1C  Depression screen PHQ 2/9 03/19/2016  Decreased Interest 1  Down, Depressed, Hopeless 0  PHQ - 2 Score 1  Altered sleeping 1  Tired,  decreased energy 1  Change in appetite 0  Feeling bad or failure about yourself  0  Trouble concentrating 0  Moving slowly or fidgety/restless 0  Suicidal thoughts 0  PHQ-9 Score 3      Assessment & Plan   1. Bilateral pulmonary embolism (HCC) Dx in 8/17, continue coumdin for total of 37month, will stop and do hypercoag wrkup. Depending on labs, may need to resume coumadin.  - pt asx.  2. Hypertension - low salt diet discussed, started hctz 25qd.  3. Daytime somnolence Somehow never called for sleep study ordered in Sept, will reorder. - Split night study; Future  4. Ingrown toenail No signs of infection. - Ambulatory referral to Podiatry  5. Breast cancer screening - MM Digital Screening; Future  6. Diabetes mellitus screening - HgB A1c  7. Morbid obesity ([place] Low carb diet discussed again, provided pt w/ lc/hf/if weight loss program info     Patient have been counseled extensively about nutrition and exercise  Return in about 3 months (around 09/10/2016), or if symptoms worsen or fail to improve.  The patient was given clear instructions to go to ER or return to medical center if symptoms don't improve, worsen or new problems develop. The patient verbalized understanding. The patient was told to call to get lab results if they haven't heard anything in the next week.   This note has been created with Dragon speech recognition  Engineer, drilling. Any transcriptional errors are unintentional.   Maren Reamer, MD, [place] [facility] [place], [place]   06/10/2016, 4:35 PM

## 2016-06-17 ENCOUNTER — Encounter: Payer: Medicaid Other | Admitting: Pharmacist

## 2016-06-17 ENCOUNTER — Ambulatory Visit: Payer: Medicaid Other | Attending: Internal Medicine | Admitting: Pharmacist

## 2016-06-17 DIAGNOSIS — I2699 Other pulmonary embolism without acute cor pulmonale: Secondary | ICD-10-CM

## 2016-06-17 LAB — POCT INR: INR: 2.3

## 2016-06-25 ENCOUNTER — Encounter: Payer: Self-pay | Admitting: Podiatry

## 2016-06-25 ENCOUNTER — Ambulatory Visit (INDEPENDENT_AMBULATORY_CARE_PROVIDER_SITE_OTHER): Payer: Medicaid Other | Admitting: Podiatry

## 2016-06-25 VITALS — BP 106/60 | HR 47

## 2016-06-25 DIAGNOSIS — L600 Ingrowing nail: Secondary | ICD-10-CM

## 2016-06-25 NOTE — Patient Instructions (Signed)

## 2016-06-25 NOTE — Progress Notes (Signed)
Subjective:     Patient ID: Angel Brewer, female   DOB: 04-17-1966, 50 y.o.   MRN: [id number]  HPI patient presents with pain in the left hallux nail stating that she cannot trim it and that she does have extreme obesity and cannot reach it. Patient states it's been incurvated and she's tried to soak it and pad it without relief of symptoms   Review of Systems  All other systems reviewed and are negative.      Objective:   Physical Exam  Constitutional: She is oriented to person, place, and time.  Cardiovascular: Intact distal pulses.   Musculoskeletal: Normal range of motion.  Neurological: She is oriented to person, place, and time.  Skin: Skin is warm.  Nursing note and vitals reviewed.  Neurovascular status intact muscle strength adequate incurvated left hallux lateral border that's very painful when pressed with patient noted to have good digital perfusion and well oriented 3    Assessment:     Ingrown toenail deformity left hallux lateral border with pain and no indication of infection    Plan:     H&P condition reviewed and I've recommended removal the corner the nail. I explained procedure and risk and today I infiltrated the left hallux 60 Milligan times I can Marcaine mixture remove the lateral border exposed matrix and applied phenol 3 applications 30 seconds followed by alcohol lavage and gave instructions on soaks and reappoint.

## 2016-07-16 ENCOUNTER — Encounter: Payer: Medicaid Other | Admitting: Pharmacist

## 2016-07-17 ENCOUNTER — Ambulatory Visit: Payer: Medicaid Other | Attending: Internal Medicine | Admitting: Pharmacist

## 2016-07-17 DIAGNOSIS — I2699 Other pulmonary embolism without acute cor pulmonale: Secondary | ICD-10-CM

## 2016-07-17 LAB — POCT INR: INR: 1.8

## 2016-07-29 ENCOUNTER — Ambulatory Visit
Admission: RE | Admit: 2016-07-29 | Discharge: 2016-07-29 | Disposition: A | Discharge status: Home / Self Care | Payer: Medicaid Other | Source: Ambulatory Visit | Attending: Internal Medicine | Admitting: Internal Medicine

## 2016-07-29 DIAGNOSIS — Z1239 Encounter for other screening for malignant neoplasm of breast: Secondary | ICD-10-CM

## 2016-07-29 DIAGNOSIS — Z1231 Encounter for screening mammogram for malignant neoplasm of breast: Secondary | ICD-10-CM | POA: Diagnosis not present

## 2016-07-31 ENCOUNTER — Encounter: Payer: Medicaid Other | Admitting: Pharmacist

## 2016-08-01 ENCOUNTER — Telehealth: Payer: Self-pay

## 2016-08-01 NOTE — Telephone Encounter (Signed)
Contacted pt to go over mm results pt is aware if results and doesn't have any questions or concerns

## 2016-08-03 ENCOUNTER — Ambulatory Visit (HOSPITAL_BASED_OUTPATIENT_CLINIC_OR_DEPARTMENT_OTHER): Payer: Medicaid Other | Attending: Internal Medicine | Admitting: Internal Medicine

## 2016-08-03 VITALS — Ht 68.0 in | Wt >= 6400 oz

## 2016-08-03 DIAGNOSIS — G4733 Obstructive sleep apnea (adult) (pediatric): Secondary | ICD-10-CM | POA: Insufficient documentation

## 2016-08-03 DIAGNOSIS — R400 Somnolence: Secondary | ICD-10-CM | POA: Diagnosis present

## 2016-08-05 ENCOUNTER — Ambulatory Visit: Payer: Medicaid Other | Attending: Internal Medicine | Admitting: Pharmacist

## 2016-08-05 DIAGNOSIS — I2699 Other pulmonary embolism without acute cor pulmonale: Secondary | ICD-10-CM

## 2016-08-05 LAB — POCT INR: INR: 2

## 2016-08-09 DIAGNOSIS — R400 Somnolence: Secondary | ICD-10-CM

## 2016-08-09 NOTE — Procedures (Signed)
Patient Name: Angel Brewer, Angel Brewer Study Date: 08/03/2016 Gender: Female D.O.B: 1966/04/17 Age (years): 43 Referring Provider: Maren Reamer Height (inches): 68 Interpreting Physician: Baird Lyons MD, ABSM Weight (lbs): 428 RPSGT: Madelon Lips BMI: 65 MRN: [id number] Neck Size: 17.50 CLINICAL INFORMATION The patient is referred for a split night study with BPAP. MEDICATIONS Medications self-administered by patient taken the night of the study : charted for review  SLEEP STUDY TECHNIQUE As per the AASM Manual for the Scoring of Sleep and Associated Events v2.3 (April 2016) with a hypopnea requiring 4% desaturations.  The channels recorded and monitored were frontal, central and occipital EEG, electrooculogram (EOG), submentalis EMG (chin), nasal and oral airflow, thoracic and abdominal wall motion, anterior tibialis EMG, snore microphone, electrocardiogram, and pulse oximetry. Bi-level positive airway pressure (BiPAP) was initiated when the patient met split night criteria and was titrated according to treat sleep-disordered breathing.  RESPIRATORY PARAMETERS Diagnostic  Total AHI (/hr): 98.6 RDI (/hr): 99.5 OA Index (/hr): 49.9 CA Index (/hr): 0.0 REM AHI (/hr): 85.7 NREM AHI (/hr): 100.0 Supine AHI (/hr): 98.6 Non-supine AHI (/hr): N/A Min O2 Sat (%): 62.00 Mean O2 (%): 82.22 Time below 88% (min): 107.2   Titration  Optimal IPAP Pressure (cm): 19 Optimal EPAP Pressure (cm): 15 AHI at Optimal Pressure (/hr): 22.8 Min O2 at Optimal Pressure (%): 85.0 Sleep % at Optimal (%): 87 Supine % at Optimal (%): 100      SLEEP ARCHITECTURE The study was initiated at 10:44:12 PM and terminated at 5:26:33 AM. The total recorded time was 402.3 minutes. EEG confirmed total sleep time was 315.4 minutes yielding a sleep efficiency of 78.4%. Sleep onset after lights out was 38.0 minutes with a REM latency of 125.5 minutes. The patient spent 20.45% of the night in stage N1 sleep, 51.17% in stage  N2 sleep, 1.90% in stage N3 and 26.48% in REM. Wake after sleep onset (WASO) was 49.0 minutes. The Arousal Index was 27.6/hour.  LEG MOVEMENT DATA The total Periodic Limb Movements of Sleep (PLMS) were 0. The PLMS index was 0.00 .  CARDIAC DATA The 2 lead EKG demonstrated sinus rhythm. The mean heart rate was 69.33 beats per minute. Other EKG findings include: None.  IMPRESSIONS - Severe obstructive sleep apnea occurred during the diagnostic portion of the study (AHI = 98.6 /hour). An optimal BiPAP pressure was selected for this patient ( 19 / 15 cm of water). CPAP gave inadequate control at 16 cwp and titration was changed to bilevel/ BIPAP. - No significant central sleep apnea occurred during the diagnostic portion of the study (CAI = 0.0/hour). - Severe oxygen desaturation was noted during the diagnostic portion of the study (Min O2 = 62.00%). - The patient snored with Loud snoring volume during the diagnostic portion of the study. - No cardiac abnormalities were noted during this study. - Clinically significant periodic limb movements of sleep did not occur during the study.  DIAGNOSIS - Obstructive Sleep Apnea (327.23 [G47.33 ICD-10])  RECOMMENDATIONS - Trial of BiPAP therapy on 19/15 cm H2O with a X-Small size Resmed Full Face Mask AirFit F10 for Her mask and heated humidification. - Avoid alcohol, sedatives and other CNS depressants that may worsen sleep apnea and disrupt normal sleep architecture. - Sleep hygiene should be reviewed to assess factors that may improve sleep quality. - Weight management and regular exercise should be initiated or continued.  [Electronically signed] 08/09/2016 11:29 AM  Baird Lyons MD, ABSM Diplomate, American Board of Sleep Medicine   NPI: [id number] Baird Lyons  D Diplomate, American Board of Sleep Medicine  ELECTRONICALLY SIGNED ON:  08/09/2016, 11:26 AM [place] PH: [phone]   FX: [phone]  [phone] [place]

## 2016-08-12 ENCOUNTER — Telehealth: Payer: Self-pay

## 2016-08-12 NOTE — Telephone Encounter (Signed)
Script for Bipap machine received from Dr. Janne Napoleon. Call placed to [facility] to determine what documentation needs to be sent along with prescription. Informed that demographics sheet, script, sleep study, and recent office note needed. Information faxed to [facility] as requested (Fax [phone]).  This Case Manager placed call to patient to inform her that script for Bipap machine sent to [facility]. Call placed to [phone]; however, unable to reach patient. HIPPA compliant voicemail left requesting return call.

## 2016-08-13 ENCOUNTER — Telehealth: Payer: Self-pay

## 2016-08-13 NOTE — Telephone Encounter (Signed)
This Case Manager placed call to [facility] to determine if they received Bipap order. Spoke with Bernestine Amass who indicated [facility] received all needed information for Bipap machine. She indicated [facility] reaching out to patient for set-up. No additional needs identified.

## 2016-08-19 ENCOUNTER — Ambulatory Visit: Payer: Medicaid Other | Attending: Internal Medicine | Admitting: Pharmacist

## 2016-08-19 DIAGNOSIS — I2699 Other pulmonary embolism without acute cor pulmonale: Secondary | ICD-10-CM

## 2016-08-19 LAB — POCT INR: INR: 2.1

## 2016-08-21 DIAGNOSIS — G4733 Obstructive sleep apnea (adult) (pediatric): Secondary | ICD-10-CM | POA: Diagnosis not present

## 2016-08-25 ENCOUNTER — Other Ambulatory Visit: Payer: Self-pay | Admitting: Pharmacist

## 2016-08-25 MED ORDER — WARFARIN SODIUM 5 MG PO TABS: ORAL_TABLET | ORAL | 0 refills | Status: DC

## 2016-09-09 ENCOUNTER — Encounter: Payer: Self-pay | Admitting: Internal Medicine

## 2016-09-09 ENCOUNTER — Ambulatory Visit: Payer: Medicaid Other | Attending: Internal Medicine | Admitting: Internal Medicine

## 2016-09-09 ENCOUNTER — Telehealth: Payer: Self-pay | Admitting: Internal Medicine

## 2016-09-09 VITALS — BP 120/85 | HR 73 | Temp 98.5°F | Resp 16 | Wt >= 6400 oz

## 2016-09-09 DIAGNOSIS — Z7901 Long term (current) use of anticoagulants: Secondary | ICD-10-CM | POA: Diagnosis not present

## 2016-09-09 DIAGNOSIS — Z9989 Dependence on other enabling machines and devices: Secondary | ICD-10-CM

## 2016-09-09 DIAGNOSIS — G4733 Obstructive sleep apnea (adult) (pediatric): Secondary | ICD-10-CM | POA: Diagnosis not present

## 2016-09-09 DIAGNOSIS — I2699 Other pulmonary embolism without acute cor pulmonale: Secondary | ICD-10-CM | POA: Diagnosis not present

## 2016-09-09 DIAGNOSIS — E559 Vitamin D deficiency, unspecified: Secondary | ICD-10-CM | POA: Diagnosis not present

## 2016-09-09 LAB — POCT INR: INR: 2.5

## 2016-09-09 LAB — BASIC METABOLIC PANEL WITH GFR
BUN: 15 mg/dL (ref 7–25)
CHLORIDE: 100 mmol/L (ref 98–110)
CO2: 30 mmol/L (ref 20–31)
Calcium: 9 mg/dL (ref 8.6–10.4)
Creat: 0.96 mg/dL (ref 0.50–1.05)
GFR, EST AFRICAN AMERICAN: 79 mL/min (ref >=60)
GFR, Est Non African American: 69 mL/min (ref >=60)
Glucose, Bld: 90 mg/dL (ref 65–99)
Potassium: 3.9 mmol/L (ref 3.5–5.3)
SODIUM: 138 mmol/L (ref 135–146)

## 2016-09-09 LAB — CBC WITH DIFFERENTIAL/PLATELET
BASOS ABS: 54 {cells}/uL (ref 0–200)
BASOS PCT: 1 %
EOS PCT: 3 %
Eosinophils Absolute: 162 cells/uL (ref 15–500)
HCT: 40.8 % (ref 35.0–45.0)
Hemoglobin: 13.1 g/dL (ref 11.7–15.5)
Lymphocytes Relative: 32 %
Lymphs Abs: 1728 cells/uL (ref 850–3900)
MCH: 28.2 pg (ref 27.0–33.0)
MCHC: 32.1 g/dL (ref 32.0–36.0)
MCV: 87.9 fL (ref 80.0–100.0)
MONOS PCT: 7 %
MPV: 11.8 fL (ref 7.5–12.5)
Monocytes Absolute: 378 cells/uL (ref 200–950)
NEUTROS ABS: 3078 {cells}/uL (ref 1500–7800)
Neutrophils Relative %: 57 %
PLATELETS: 283 10*3/uL (ref 140–400)
RBC: 4.64 MIL/uL (ref 3.80–5.10)
RDW: 14.8 % (ref 11.0–15.0)
WBC: 5.4 10*3/uL (ref 3.8–10.8)

## 2016-09-09 MED ORDER — HYDROCHLOROTHIAZIDE 25 MG PO TABS: 25.0000 mg | ORAL_TABLET | Freq: Every day | ORAL | 3 refills | Status: DC

## 2016-09-09 NOTE — Telephone Encounter (Signed)
Per Dr. Janne Napoleon that will be okay alongs she stop every now and then and walk and stretch legs. Return pt call and made her aware

## 2016-09-09 NOTE — Progress Notes (Signed)
Pt main concern is to see if she is able to get off Coumadin

## 2016-09-09 NOTE — Patient Instructions (Addendum)
Calcium 1256m /day Vit D - ? Pending labs -   Sleep Apnea Sleep apnea is a condition that affects breathing. People with sleep apnea have moments during sleep when their breathing pauses briefly or gets shallow. Sleep apnea can cause these symptoms:  Trouble staying asleep.  Sleepiness or tiredness during the day.  Irritability.  Loud snoring.  Morning headaches.  Trouble concentrating.  Forgetting things.  Less interest in sex.  Being sleepy for no reason.  Mood swings.  Personality changes.  Depression.  Waking up a lot during the night to pee (urinate).  Dry mouth.  Sore throat. Follow these instructions at home:  Make any changes in your routine that your doctor recommends.  Eat a healthy, well-balanced diet.  Take over-the-counter and prescription medicines only as told by your doctor.  Avoid using alcohol, calming medicines (sedatives), and narcotic medicines.  Take steps to lose weight if you are overweight.  If you were given a machine (device) to use while you sleep, use it only as told by your doctor.  Do not use any tobacco products, such as cigarettes, chewing tobacco, and e-cigarettes. If you need help quitting, ask your doctor.  Keep all follow-up visits as told by your doctor. This is important. Contact a doctor if:  The machine that you were given to use during sleep is uncomfortable or does not seem to be working.  Your symptoms do not get better.  Your symptoms get worse. Get help right away if:  Your chest hurts.  You have trouble breathing in enough air (shortness of breath).  You have an uncomfortable feeling in your back, arms, or stomach.  You have trouble talking.  One side of your body feels weak.  A part of your face is hanging down (drooping). These symptoms may be an emergency. Do not wait to see if the symptoms will go away. Get medical help right away. Call your local emergency services (911 in the [place] Do not  drive yourself to the hospital. This information is not intended to replace advice given to you by your health care provider. Make sure you discuss any questions you have with your health care provider. Document Released: 04/08/2008 Document Revised: 02/24/2016 Document Reviewed: 04/09/2015 Elsevier Interactive Patient Education  2017 [employer]  -

## 2016-09-09 NOTE — Telephone Encounter (Signed)
Pt. Called stating she for got to ask her PCP if she can take a 4 hour trip to [place].  Please f/u

## 2016-09-09 NOTE — Progress Notes (Signed)
Patient with INR = 2.5 today. Continue current dose and follow up with me in 1 month unless Hematology decides to stop the coumadin before then.

## 2016-09-09 NOTE — Progress Notes (Signed)
Angel Brewer, is a 51 y.o. female  RSW:[id number]  KXF:[id number]  DOB - 12/09/1965  Chief Complaint  Patient presents with  . Follow-up        Subjective:   Angel Brewer is a 51 y.o. female here today for a follow up visit for bilat pe /dvt in RLE, dx 03/01/16 felt 2nd to long car ride to [place] at time, no hypercoag wkup done at time, osa now on cpap (received her machine about 1 month ago), and morbid obesity. Of note, pt is still taking her coumadin as directed, sees [facility]/ cou[facility]in clinic.  She is doing well overall. States she tries to use her cpap, but falls asleep sometimes forgetting to put it on, or ends off off her face when she is sleeping.   Patient has No headache, No chest pain, No abdominal pain - No Nausea, No new weakness tingling or numbness, No Cough - SOB.  No problems updated.  ALLERGIES: No Known Allergies  PAST MEDICAL HISTORY: Past Medical History:  Diagnosis Date  . Morbid obesity ([place])     MEDICATIONS AT HOME: Prior to Admission medications   Medication Sig Start Date End Date Taking? Authorizing Provider  hydrochlorothiazide (HYDRODIURIL) 25 MG tablet Take 1 tablet (25 mg total) by mouth daily. 09/09/16   Maren Reamer, MD  warfarin (COUMADIN) 5 MG tablet Take as directed by Coumadin Clinic 08/25/16   Maren Reamer, MD     Objective:   Vitals:   09/09/16 0850  BP: 120/85  Pulse: 73  Resp: 16  Temp: 98.5 F (36.9 C)  TempSrc: Oral  SpO2: 97%  Weight: (!) 433 lb 6.4 oz (196.6 kg)    Exam General appearance : Awake, alert, not in any distress. Speech Clear. Not toxic looking, morbid obese, pleasant. HEENT: Atraumatic and Normocephalic, pupils equally reactive to light. Neck: supple, no JVD.  Chest:Good air entry bilaterally, no added sounds. CVS: S1 S2 regular, no murmurs/gallups or rubs. Abdomen: Bowel sounds active, Non tender and not distended with no gaurding, rigidity or rebound. Extremities: B/L Lower Ext  shows no edema, both legs are warm to touch Neurology: Awake alert, and oriented X 3, CN II-XII grossly intact, Non focal Skin:No Rash  Data Review Lab Results  Component Value Date   HGBA1C 5.4 06/10/2016    Depression screen [facility] 2/9 09/09/2016 03/19/2016  Decreased Interest 0 1  Down, Depressed, Hopeless 0 0  PHQ - 2 Score 0 1  Altered sleeping - 1  Tired, decreased energy - 1  Change in appetite - 0  Feeling bad or failure about yourself  - 0  Trouble concentrating - 0  Moving slowly or fidgety/restless - 0  Suicidal thoughts - 0  PHQ-9 Score - 3      Assessment & Plan   1. Bilateral pulmonary embolism (HCC) and rle dvt  Dx 8/17, will refer to heme/onc for eval prior to stopping coumadin given severity of her VTE at time, no prior hypercoag wkup found. - Ambulatory referral to Hematology - CBC with Differential - BASIC METABOLIC PANEL WITH GFR - INR  2. OSA on CPAP Encouraged to use cpap at least 4 hrs /night, dicussed tips/pillows to help elevate, etc. Avoid sedative/hypnotics prior to sleep. Better sleep hygeine discussed.  3. Vitamin D deficiency - VITAMIN D 25 Hydroxy (Vit-D Deficiency, Fractures)  4. Morbid obesity ([place]) Consider bariatric surg referral once vte clearance.  5. Colon cancer screening - ref to gi for cscope once vte  clearance.     Patient have been counseled extensively about nutrition and exercise  Return in about 3 months (around 12/07/2016).  The patient was given clear instructions to go to ER or return to medical center if symptoms don't improve, worsen or new problems develop. The patient verbalized understanding. The patient was told to call to get lab results if they haven't heard anything in the next week.   This note has been created with Surveyor, quantity. Any transcriptional errors are unintentional.   Maren Reamer, MD, [facility] [place], [place]   09/09/2016, 9:52 AM

## 2016-09-10 ENCOUNTER — Other Ambulatory Visit: Payer: Self-pay | Admitting: Internal Medicine

## 2016-09-10 LAB — VITAMIN D 25 HYDROXY (VIT D DEFICIENCY, FRACTURES): VIT D 25 HYDROXY: 11 ng/mL — AB (ref 30–100)

## 2016-09-10 MED ORDER — VITAMIN D (ERGOCALCIFEROL) 1.25 MG (50000 UNIT) PO CAPS: 50000.0000 [IU] | ORAL_CAPSULE | ORAL | 0 refills | Status: DC

## 2016-09-11 DIAGNOSIS — G4733 Obstructive sleep apnea (adult) (pediatric): Secondary | ICD-10-CM | POA: Diagnosis not present

## 2016-09-17 ENCOUNTER — Telehealth: Payer: Self-pay

## 2016-09-17 NOTE — Telephone Encounter (Signed)
Contacted pt to go over lab results pt is aware of results and doesn't have any questions or concerns

## 2016-09-25 ENCOUNTER — Other Ambulatory Visit: Payer: Self-pay | Admitting: Internal Medicine

## 2016-09-29 ENCOUNTER — Telehealth: Payer: Self-pay | Admitting: Oncology

## 2016-09-29 ENCOUNTER — Other Ambulatory Visit: Payer: Self-pay | Admitting: *Deleted

## 2016-09-29 ENCOUNTER — Ambulatory Visit (HOSPITAL_BASED_OUTPATIENT_CLINIC_OR_DEPARTMENT_OTHER): Payer: Medicaid Other

## 2016-09-29 ENCOUNTER — Ambulatory Visit (HOSPITAL_BASED_OUTPATIENT_CLINIC_OR_DEPARTMENT_OTHER): Payer: Medicaid Other | Admitting: Oncology

## 2016-09-29 VITALS — BP 152/86 | HR 73 | Temp 98.3°F | Resp 18 | Ht 68.0 in | Wt >= 6400 oz

## 2016-09-29 DIAGNOSIS — I825Y1 Chronic embolism and thrombosis of unspecified deep veins of right proximal lower extremity: Secondary | ICD-10-CM | POA: Diagnosis not present

## 2016-09-29 DIAGNOSIS — I2699 Other pulmonary embolism without acute cor pulmonale: Secondary | ICD-10-CM

## 2016-09-29 DIAGNOSIS — G4730 Sleep apnea, unspecified: Secondary | ICD-10-CM

## 2016-09-29 NOTE — Progress Notes (Signed)
[facility] Initial Visit:  Patient Care Team: Maren Reamer, MD as PCP - General (Internal Medicine)  CHIEF COMPLAINTS/PURPOSE OF CONSULTATION:   Patient was referred for hematology evaluation regarding the evaluation of bilateral pulmonary embolism and DVT.   HISTORY OF PRESENTING ILLNESS: Angel Brewer 50 y.o. female is here because of  newly diagnosed to bilateral pulmonary embolism and right lower extremity DVT.  Patient has  history of  sleep apnea and  morbid obesity.   Her history of present illness goes back to August 2017, when patient took a road trip to [place] with her family.  Apparently patient was driven to [place] without a  break for approximately 9 hours. When patient returned home,  she started having mild shortness of breath,  however has progressed over a period of 2 weeks. Hence patient was seen in emergency room.  Patient had a CT angiogram of the chest performed on her 02/29/2016, which has  revealed bilateral pulmonary emboli. Patient was also diagnosed with deep venous thrombosis of the right lower extremity.  Hence patient was started on anticoagulation with Coumadin. It appears patient never had a hypercoagulation workup during or after the episode. Patient was referred for hematology evaluation for further workup and to discuss further management options. At this time patient denies chest pain, shortness of breath, abdominal pain, nausea or vomiting, or lower extremity swelling.    Patient is also considering having bariatric surgery for her weight management.  Review of Systems  Constitutional: Negative for appetite change, chills, diaphoresis, fatigue, fever and unexpected weight change.  HENT:   Negative for hearing loss, lump/mass, mouth sores, nosebleeds, sore throat, tinnitus and trouble swallowing.   Eyes: Negative for icterus.  Respiratory: Negative for chest tightness, cough, hemoptysis, shortness of breath and wheezing.    Cardiovascular: Negative for chest pain, leg swelling and palpitations.  Gastrointestinal: Negative for abdominal distention, abdominal pain, blood in stool, constipation, diarrhea, nausea, rectal pain and vomiting.  Genitourinary: Negative for bladder incontinence, difficulty urinating, frequency, hematuria and pelvic pain.   Musculoskeletal: Negative for arthralgias, back pain, flank pain, gait problem, myalgias, neck pain and neck stiffness.  Skin: Negative for itching and rash.  Neurological: Negative for dizziness, gait problem, headaches, light-headedness, numbness, seizures and speech difficulty.  Hematological: Negative for adenopathy. Does not bruise/bleed easily.  Psychiatric/Behavioral: Negative for confusion, depression and sleep disturbance. The patient is not nervous/anxious.     MEDICAL HISTORY: Past Medical History:  Diagnosis Date  . Morbid obesity ([place])     SURGICAL HISTORY: Past Surgical History:  Procedure Laterality Date  . CESAREAN SECTION      SOCIAL HISTORY: Social History   Social History  . Marital status: Single    Spouse name: N/A  . Number of children: N/A  . Years of education: N/A   Occupational History  . Not on file.   Social History Main Topics  . Smoking status: Former Research scientist (life sciences)  . Smokeless tobacco: Never Used  . Alcohol use No  . Drug use: No  . Sexual activity: Not on file   Other Topics Concern  . Not on file   Social History Narrative  . No narrative on file    FAMILY HISTORY History reviewed. No pertinent family history.  ALLERGIES:  has No Known Allergies.  MEDICATIONS:  Current Outpatient Prescriptions  Medication Sig Dispense Refill  . hydrochlorothiazide (HYDRODIURIL) 25 MG tablet Take 1 tablet (25 mg total) by mouth daily. 90 tablet 3  . Vitamin D,  Ergocalciferol, (DRISDOL) 50000 units CAPS capsule Take 1 capsule (50,000 Units total) by mouth every 7 (seven) days. (Patient taking differently: Take 1,200 Units by  mouth every 7 (seven) days. ) 12 capsule 0  . warfarin (COUMADIN) 5 MG tablet TAKE AS DIRECTED BY COUMADIN CLINIC 30 tablet 2   No current facility-administered medications for this visit.     PHYSICAL EXAMINATION:  ECOG PERFORMANCE STATUS: 0 - Asymptomatic   Vitals:   09/29/16 0900  BP: (!) 152/86  Pulse: 73  Resp: 18  Temp: 98.3 F (36.8 C)    Filed Weights   09/29/16 0900  Weight: (!) 436 lb 5 oz (197.9 kg)     Physical Exam  Constitutional: She is oriented to person, place, and time and well-developed, well-nourished, and in no distress. No distress.  HENT:  Head: Normocephalic and atraumatic.  Mouth/Throat: Oropharynx is clear and moist.  Eyes: Conjunctivae and EOM are normal. Pupils are equal, round, and reactive to light. Right eye exhibits no discharge. Left eye exhibits no discharge. No scleral icterus.  Neck: Normal range of motion. Neck supple. No JVD present. No tracheal deviation present. No thyromegaly present.  Cardiovascular: Normal rate, regular rhythm and intact distal pulses.  Exam reveals no gallop and no friction rub.   No murmur heard. Pulmonary/Chest: Effort normal and breath sounds normal. No stridor. No respiratory distress. She has no wheezes. She has no rales. She exhibits no tenderness.  Abdominal: Soft. Bowel sounds are normal. She exhibits no distension and no mass. There is no tenderness. There is no rebound and no guarding.  Musculoskeletal: Normal range of motion. She exhibits no edema, tenderness or deformity.  Lymphadenopathy:    She has no cervical adenopathy.  Neurological: She is alert and oriented to person, place, and time. No cranial nerve deficit. Gait normal. Coordination normal.  Skin: Skin is warm and dry. No rash noted. She is not diaphoretic. No erythema. No pallor.  Psychiatric: Mood, memory, affect and judgment normal.   Patient is morbidly obese.  LABORATORY DATA: I have personally reviewed the data as listed:  Office  Visit on 09/09/2016  Component Date Value Ref Range Status  . Vit D, 25-Hydroxy 09/09/2016 11* 30 - 100 ng/mL Final   Comment: Vitamin D Status           25-OH Vitamin D        Deficiency                <20 ng/mL        Insufficiency         20 - 29 ng/mL        Optimal             > or = 30 ng/mL   For 25-OH Vitamin D testing on patients on D2-supplementation and patients for whom quantitation of D2 and D3 fractions is required, the QuestAssureD 25-OH VIT D, (D2,D3), LC/MS/MS is recommended: order code [phone] (patients > 2 yrs).   . WBC 09/09/2016 5.4  3.8 - 10.8 K/uL Final  . RBC 09/09/2016 4.64  3.80 - 5.10 MIL/uL Final  . Hemoglobin 09/09/2016 13.1  11.7 - 15.5 g/dL Final  . HCT 09/09/2016 40.8  35.0 - 45.0 % Final  . MCV 09/09/2016 87.9  80.0 - 100.0 fL Final  . MCH 09/09/2016 28.2  27.0 - 33.0 pg Final  . MCHC 09/09/2016 32.1  32.0 - 36.0 g/dL Final  . RDW 09/09/2016 14.8  11.0 - 15.0 % Final  .  Platelets 09/09/2016 283  140 - 400 K/uL Final  . MPV 09/09/2016 11.8  7.5 - 12.5 fL Final  . Neutro Abs 09/09/2016 3078  1,500 - 7,800 cells/uL Final  . Lymphs Abs 09/09/2016 1728  850 - 3,900 cells/uL Final  . Monocytes Absolute 09/09/2016 378  200 - 950 cells/uL Final  . Eosinophils Absolute 09/09/2016 162  15 - 500 cells/uL Final  . Basophils Absolute 09/09/2016 54  0 - 200 cells/uL Final  . Neutrophils Relative % 09/09/2016 57  % Final  . Lymphocytes Relative 09/09/2016 32  % Final  . Monocytes Relative 09/09/2016 7  % Final  . Eosinophils Relative 09/09/2016 3  % Final  . Basophils Relative 09/09/2016 1  % Final  . Smear Review 09/09/2016 Criteria for review not met   Final  . Sodium 09/09/2016 138  135 - 146 mmol/L Final  . Potassium 09/09/2016 3.9  3.5 - 5.3 mmol/L Final  . Chloride 09/09/2016 100  98 - 110 mmol/L Final  . CO2 09/09/2016 30  20 - 31 mmol/L Final  . Glucose, Bld 09/09/2016 90  65 - 99 mg/dL Final  . BUN 09/09/2016 15  7 - 25 mg/dL Final  . Creat 09/09/2016  0.96  0.50 - 1.05 mg/dL Final   Comment:   For patients > or = 51 years of age: The upper reference limit for Creatinine is approximately 13% higher for people identified as African-American.     . Calcium 09/09/2016 9.0  8.6 - 10.4 mg/dL Final  . GFR, Est African American 09/09/2016 79  >=60 mL/min Final  . GFR, Est Non African American 09/09/2016 69  >=60 mL/min Final  . INR 09/09/2016 2.5   Final     ASSESSMENT/PLAN 1. Newly diagnosed right lower extremity DVT with bilateral pulmonary embolism. 2. Morbid obesity. 3. Currently on anticoagulation with the Coumadin.   [name], a 51 year old Afro-American woman, with a known history of sleep apnea and morbid obesity has developed right lower extremity DVT and bilateral emboli after a  prolonged trip to [place].  Patient currently on anticoagulation, treated  more than 6 months with Coumadin.  Patient never had  workup for hypercoagulation. Based on the history, it appears patient has a provoked to lower extremity DVT and pulmonary emboli after the long road trip. However I would agree with her primary care provider to do further workup to rule out the possibility of her underlying the hypercoagulation state.  Hence I have advised sending further blood tests including factor V Leiden gene mutation, prothrombin gene mutation, workup for antiphospholipid syndrome, antithrombin III level and factor VIII levels  I also recommended sending a repeat CT angiogram as well as Doppler ultrasound of the lower extremities. I have made a follow-up appointment in the outpatient clinic in 2 weeks to discuss further management options.   In general in provoked DVT or pulmonary emboli,  6 months of anticoagulation is sufficient unless patient has ongoing coagulation    However I would discuss those issues after the above tests are performed. I have also discussed pros  and cons of  anticoagulation especially bleeding  complications.  Patient has expressed the understanding. I once again thank her primary care provider for giving me the opportunity to participate in her care.   Orders Placed This Encounter  Procedures  . CT ANGIO CHEST PE W OR WO CONTRAST    Standing Status:   Future    Standing Expiration Date:   12/30/2017  Order Specific Question:   If indicated for the ordered procedure, I authorize the administration of contrast media per Radiology protocol    Answer:   Yes    Order Specific Question:   Reason for Exam (SYMPTOM  OR DIAGNOSIS REQUIRED)    Answer:   pul embolism    Order Specific Question:   Is patient pregnant?    Answer:   No    Order Specific Question:   Preferred imaging location?    Answer:   [facility]    Order Specific Question:   Radiology Contrast Protocol will attach to exam    Answer:   [email or web address]  . US Venous Img Lower Bilateral    Standing Status:   Future    Standing Expiration Date:   11/29/2017    Order Specific Question:   Reason for Exam (SYMPTOM  OR DIAGNOSIS REQUIRED)    Answer:   dvt    Order Specific Question:   Preferred imaging location?    Answer:   [facility]  . Factor 5 Leiden*    Standing Status:   Future    Number of Occurrences:   1    Standing Expiration Date:   09/29/2017  . Prothrombin gene mutation*    Standing Status:   Future    Number of Occurrences:   1    Standing Expiration Date:   09/29/2017  . Lupus anticoagulant panel*    Standing Status:   Future    Number of Occurrences:   1    Standing Expiration Date:   09/29/2017  . Cardiolipin antibodies, IgG, IgM, IgA*    Standing Status:   Future    Number of Occurrences:   1    Standing Expiration Date:   09/29/2017  . Beta-2-glycoprotein i abs, IgG/M/A    Standing Status:   Future    Number of Occurrences:   1    Standing Expiration Date:   09/29/2017  . Antithrombin III*    Standing Status:   Future    Number of Occurrences:   1     Standing Expiration Date:   09/29/2017  . Factor 8 assay    Standing Status:   Future    Number of Occurrences:   1    Standing Expiration Date:   09/29/2017    All questions were answered. The patient knows to call the clinic with any problems, questions or concerns.  This note was electronically signed.    Judge Stall, MD  09/29/2016 12:58 PM

## 2016-09-29 NOTE — Telephone Encounter (Signed)
Gave patient AVS and calender per 09/29/2016. [facility] to contact patient with CT schedule.

## 2016-09-30 LAB — ANTITHROMBIN III: Antithrombin Activity: 98 % (ref 75–135)

## 2016-09-30 LAB — FACTOR 8 ASSAY: Factor VIII Activity: 197 % — ABNORMAL HIGH (ref 57–163)

## 2016-10-01 LAB — BETA-2-GLYCOPROTEIN I ABS, IGG/M/A

## 2016-10-01 LAB — LUPUS ANTICOAGULANT PANEL
DRVVT MIX: 45.8 s (ref 0.0–47.0)
DRVVT: 77.2 s — AB (ref 0.0–47.0)
PTT-LA: 47.5 s (ref 0.0–51.9)

## 2016-10-01 LAB — CARDIOLIPIN ANTIBODIES, IGG, IGM, IGA
Anticardiolipin Ab,IgG,Qn: <9 GPL U/mL (ref 0–14)
Anticardiolipin Ab,IgM,Qn: <9 MPL U/mL (ref 0–12)

## 2016-10-03 LAB — FACTOR 5 LEIDEN

## 2016-10-03 LAB — PROTHROMBIN GENE MUTATION

## 2016-10-07 ENCOUNTER — Ambulatory Visit: Payer: Medicaid Other | Attending: Internal Medicine | Admitting: Pharmacist

## 2016-10-07 ENCOUNTER — Other Ambulatory Visit: Payer: Self-pay | Admitting: *Deleted

## 2016-10-07 DIAGNOSIS — I2699 Other pulmonary embolism without acute cor pulmonale: Secondary | ICD-10-CM

## 2016-10-07 DIAGNOSIS — Z86711 Personal history of pulmonary embolism: Secondary | ICD-10-CM

## 2016-10-07 LAB — POCT INR: INR: 2.6

## 2016-10-08 ENCOUNTER — Ambulatory Visit (HOSPITAL_COMMUNITY)
Admission: RE | Admit: 2016-10-08 | Discharge: 2016-10-08 | Disposition: A | Discharge status: Home / Self Care | Payer: Medicaid Other | Source: Ambulatory Visit | Attending: Oncology | Admitting: Oncology

## 2016-10-08 ENCOUNTER — Telehealth: Payer: Self-pay

## 2016-10-08 DIAGNOSIS — R600 Localized edema: Secondary | ICD-10-CM | POA: Diagnosis not present

## 2016-10-08 DIAGNOSIS — Z86711 Personal history of pulmonary embolism: Secondary | ICD-10-CM | POA: Insufficient documentation

## 2016-10-08 DIAGNOSIS — E669 Obesity, unspecified: Secondary | ICD-10-CM | POA: Insufficient documentation

## 2016-10-08 DIAGNOSIS — Z86718 Personal history of other venous thrombosis and embolism: Secondary | ICD-10-CM | POA: Insufficient documentation

## 2016-10-08 NOTE — Progress Notes (Signed)
VASCULAR LAB PRELIMINARY  PRELIMINARY  PRELIMINARY  PRELIMINARY  Bilateral lower extremity venous duplex completed.    Preliminary report: Technically difficult study due to body habitus.  Bilateral - All areas of the veins in the thigh could not be visualized. The previous DVT noted in the right popliteal vein 03/01/16 appears to have resolved. There is no obvious evidence of DVT, superficial thrombosis or Baker's cyst.  Finn Altemose, RVS 10/08/2016, 11:23 AM

## 2016-10-08 NOTE — Telephone Encounter (Signed)
[place] from vascular lab called report that Bilat LE doppler follow up study of R popliteal DVT is done. The DVT is now resolved. The pt has an appt 4/2 but is wanting to know if she needs to discontinue warfarin now or after her visit.

## 2016-10-08 NOTE — Telephone Encounter (Signed)
Per MD Thana Farr, patient will keep appointment with him and that will be discussed at visit.

## 2016-10-12 DIAGNOSIS — G4733 Obstructive sleep apnea (adult) (pediatric): Secondary | ICD-10-CM | POA: Diagnosis not present

## 2016-10-13 ENCOUNTER — Ambulatory Visit (HOSPITAL_BASED_OUTPATIENT_CLINIC_OR_DEPARTMENT_OTHER): Payer: Medicaid Other | Admitting: Oncology

## 2016-10-13 ENCOUNTER — Other Ambulatory Visit (HOSPITAL_COMMUNITY)
Admission: RE | Admit: 2016-10-13 | Discharge: 2016-10-13 | Disposition: A | Discharge status: Home / Self Care | Payer: Medicaid Other | Source: Ambulatory Visit | Attending: Oncology | Admitting: Oncology

## 2016-10-13 ENCOUNTER — Other Ambulatory Visit: Payer: Self-pay | Admitting: *Deleted

## 2016-10-13 ENCOUNTER — Ambulatory Visit (HOSPITAL_BASED_OUTPATIENT_CLINIC_OR_DEPARTMENT_OTHER): Payer: Medicaid Other

## 2016-10-13 ENCOUNTER — Telehealth: Payer: Self-pay | Admitting: *Deleted

## 2016-10-13 ENCOUNTER — Telehealth: Payer: Self-pay | Admitting: Oncology

## 2016-10-13 VITALS — BP 137/63 | HR 75 | Temp 98.2°F | Resp 22 | Ht 68.0 in | Wt >= 6400 oz

## 2016-10-13 DIAGNOSIS — I824Z1 Acute embolism and thrombosis of unspecified deep veins of right distal lower extremity: Secondary | ICD-10-CM

## 2016-10-13 DIAGNOSIS — I2699 Other pulmonary embolism without acute cor pulmonale: Secondary | ICD-10-CM

## 2016-10-13 LAB — D-DIMER, QUANTITATIVE: D-Dimer, Quant: <0.27 ug/mL-FEU (ref 0.00–0.50)

## 2016-10-13 NOTE — Telephone Encounter (Signed)
Appointments scheduled per 4.2.18 LOS. Patient given AVS report and calendars with future scheduled appointments.

## 2016-10-13 NOTE — Progress Notes (Signed)
[facility] Follow up:    Angel Reamer, MD [address] [place] [address]    INTERVAL HISTORY: Angel Brewer 51 y.o. female returns for follow-up visit. Patient has knownhistory of DVT and a pulmonary embolism diagnosed approximately 8 months ago.  Patient has been treated with anticoagulation with warfarin for more than 6 months. Patient continued to be treated with the daily warfarin therapy. Patient wishes to discontinue her anticoagulation therapy.  I had the opportunity to evaluate her for consultation recently in the outpatient clinic. I have ordered repeat Doppler ultrasound of the right lower extremity and a CT angiogram. However repeat  Doppler ultrasound did not reveal presence of any residualclot. However her insurance company did not approve for a CT angiogram.   I have also performed workup for a hypercoagulation syndrome. Nonetheless all the tests came back negative.   Patient denies any shortness of breath, chest pain PND or orthopnea.  Patient Active Problem List   Diagnosis Date Noted  . Bilateral pulmonary embolism ([place]) 02/29/2016  . PE (pulmonary embolism) 02/29/2016    has No Known Allergies.  MEDICAL HISTORY: Past Medical History:  Diagnosis Date  . Morbid obesity ([place])     SURGICAL HISTORY: Past Surgical History:  Procedure Laterality Date  . CESAREAN SECTION      SOCIAL HISTORY: Social History   Social History  . Marital status: Single    Spouse name: N/A  . Number of children: N/A  . Years of education: N/A   Occupational History  . Not on file.   Social History Main Topics  . Smoking status: Former Research scientist (life sciences)  . Smokeless tobacco: Never Used  . Alcohol use No  . Drug use: No  . Sexual activity: Not on file   Other Topics Concern  . Not on file   Social History Narrative  . No narrative on file    FAMILY HISTORY: History reviewed. No pertinent family history.  Review of Systems - Oncology     PHYSICAL EXAMINATION  ECOG PERFORMANCE STATUS: 0 - Asymptomatic  Vitals:   10/13/16 0921  BP: 137/63  Pulse: 75  Resp: (!) 22  Temp: 98.2 F (36.8 C)    Physical Exam  LABORATORY DATA:  CBC    Component Value Date/Time   WBC 5.4 09/09/2016 0911   RBC 4.64 09/09/2016 0911   HGB 13.1 09/09/2016 0911   HCT 40.8 09/09/2016 0911   PLT 283 09/09/2016 0911   MCV 87.9 09/09/2016 0911   MCH 28.2 09/09/2016 0911   MCHC 32.1 09/09/2016 0911   RDW 14.8 09/09/2016 0911   LYMPHSABS 1,728 09/09/2016 0911   MONOABS 378 09/09/2016 0911   EOSABS 162 09/09/2016 0911   BASOSABS 54 09/09/2016 0911    CMP     Component Value Date/Time   NA 138 09/09/2016 0911   K 3.9 09/09/2016 0911   CL 100 09/09/2016 0911   CO2 30 09/09/2016 0911   GLUCOSE 90 09/09/2016 0911   BUN 15 09/09/2016 0911   CREATININE 0.96 09/09/2016 0911   CALCIUM 9.0 09/09/2016 0911   PROT 7.4 03/01/2016 0255   ALBUMIN 3.2 (L) 03/01/2016 0255   AST 16 03/01/2016 0255   ALT 12 (L) 03/01/2016 0255   ALKPHOS 54 03/01/2016 0255   BILITOT 0.5 03/01/2016 0255   GFRNONAA 69 09/09/2016 0911   GFRAA 79 09/09/2016 0911         ASSESSMENT and THERAPY PLAN:     ASSESSMENT/PLAN 1. Newly  diagnosed right lower extremity DVT with bilateral pulmonary embolism. 2. Morbid obesity. 3. Currently on anticoagulation with the Coumadin.   Patient has been diagnosed with the right lower extremity DVT and pulmonary embolism approximately 8 months ago.  Patient has been treated with anticoagulation with warfarin for more than 6 months. At present time patient wishes to discontinue her anticoagulation. Patient also wishes to contemplate bariatric surgery for weight reduction.  All the workup for hypercoagulation syndrome has been so far negative. Hence I do believe, her DVT and pulmonary embolism might be related to her long car trip to [place]. I have also discussed with patient,  her morbid obesity is also a  contributing factor.   I have sent another blood test for d-dimer's. If the d-dimer are negative, I would consider discontinuation of further anticoagulation at this time.   Today her  d-dimer test came back negative hence  discontinuation of anticoagulation at this time is very reasonable.   Her DVT and pulmonary embolism could be considered as  provoked by her long car journey.  I would call the patient at her home regarding the test results. I did not make a follow-up appointment in the outpatient clinic. However I would be more than happy to to see her if she needs any further hematological assistance in the near future.  I would like to thank again her primary care provider for giving me the opportunity to articipatein her care.  I have also given the counseling regarding long car trips. I have counseled her,  she needs to take it frequent breaks to prevent DVT and a embolic phenomenon in future.    All questions were answered. The patient knows to call the clinic with any problems, questions or concerns. We can certainly see the patient much sooner if necessary. This note was electronically signed. Judge Stall, MD 10/13/2016

## 2016-10-13 NOTE — Telephone Encounter (Signed)
Per MD Thana Farr, informed patient that D-dimer test came back negative and she can stop warfarin. Patient verbalized understanding.

## 2016-10-27 ENCOUNTER — Ambulatory Visit: Payer: Medicaid Other | Admitting: Internal Medicine

## 2016-10-29 ENCOUNTER — Encounter: Payer: Self-pay | Admitting: Internal Medicine

## 2016-10-29 ENCOUNTER — Ambulatory Visit: Payer: Medicaid Other | Attending: Internal Medicine | Admitting: Internal Medicine

## 2016-10-29 VITALS — BP 132/87 | HR 73 | Temp 98.3°F | Resp 16 | Wt >= 6400 oz

## 2016-10-29 DIAGNOSIS — I2699 Other pulmonary embolism without acute cor pulmonale: Secondary | ICD-10-CM | POA: Diagnosis present

## 2016-10-29 DIAGNOSIS — E559 Vitamin D deficiency, unspecified: Secondary | ICD-10-CM | POA: Diagnosis not present

## 2016-10-29 DIAGNOSIS — E66813 Obesity, class 3: Secondary | ICD-10-CM | POA: Insufficient documentation

## 2016-10-29 DIAGNOSIS — Z124 Encounter for screening for malignant neoplasm of cervix: Secondary | ICD-10-CM

## 2016-10-29 DIAGNOSIS — Z86718 Personal history of other venous thrombosis and embolism: Secondary | ICD-10-CM | POA: Insufficient documentation

## 2016-10-29 DIAGNOSIS — I10 Essential (primary) hypertension: Secondary | ICD-10-CM | POA: Insufficient documentation

## 2016-10-29 DIAGNOSIS — Z6841 Body Mass Index (BMI) 40.0 and over, adult: Secondary | ICD-10-CM | POA: Insufficient documentation

## 2016-10-29 MED ORDER — HYDROCHLOROTHIAZIDE 25 MG PO TABS: 25.0000 mg | ORAL_TABLET | Freq: Every day | ORAL | 3 refills | Status: DC

## 2016-10-29 NOTE — Patient Instructions (Signed)
QUICK START PATIENT GUIDE TO LCHF/IF - weight loss recommendations LOW CARB HIGH FAT / INTERMITTENT FASTING  Recommend: <50 gram carbohydrate a day for weightloss.  What is this diet and how does it work? o Insulin is a hormone made by your body that allows you to use sugar (glucose) from carbohydrates in the food you eat for energy or to store glucose (as fat) for future use  o Insulin levels need to be lowered in order to utilize our stored energy (fat) o Many struggling with obesity are insulin resistant and have high levels of insulin o This diet works to lower your insulin in two ways o Fasting - allows your insulin levels to naturally decrease  o Avoiding carbohydrates - carbs trigger increase in insulin Low Carb Healthy Fat (LCHF) o Get a free app for your phone, such as MyFitnessPal, to help you track your macronutrients (carbs/protein/fats) and to track your weight and body measurements to see your progress o Set your goal for around 10% carbs/20% protein/70% fat o A good starting goal for amount of net carbs per day is 50 grams (some will aim for 20 grams) o "Net carbs" refers to total grams of carbs minus grams of fiber (as fiber is not typically absorbed). For example, if a food has 5g total carb and 3g fiber, that would be 2g net carbs o Increase healthy fats - eg. olive oil, eggs, nuts, avocado, cheese, butter, coconut, meats, fish o Avoid high carb foods - eg. bread, pasta, potatoes, rice, cookies, soda, juice, anything sugary o Buy full-fat ingredients (avoid low-fat versions, which often have more sugar) o No need to count calories, but pay close attention to grams of carbs on labels Intermittent Fasting (IF) o "Fasting" is going a period of time without eating - it helps to stay busy and well-hydrated o Purpose of fasting is to allow insulin levels to drop as low as possible, allowing your body to switch into fat-burning mode o With this diet there are many approaches to  fasting, but 16:8 and 24hr fasts are commonly used o 16:8 fast, usually 5-7 days a week - Fasting for 16 hours of the day, then eating all meals for the day over course of 8 hours. o 24 hour fast, usually 1-3 days a week - Typically eating one meal a day, then fasting until the next day. Plenty of fluids (and some salt to help you hold onto fluids) are recommended during longer fasts.  o During fasts certain beverages are still acceptable - water, sparkling water, bone broth, black tea or coffee, or tea/coffee with small amount of heavy whipping cream Special note for those on diabetic medications o Discuss your medications with your physician. You may need to hold your medication or adjust to only taking when eating. Diabetics should keep close track of their blood sugars when making any changes to diet/meds, to ensure they are staying within normal limits For more info about LCHF/IF o Watch "Therapeutic Fasting - Solving the Two-Compartment Problem" video by Dr. Sharman Cheek on YouTube (GreatestGyms.com.ee) for a great intro to these concepts o Read "The Obesity Code" and/or "The Complete Guide to Fasting" by Dr. Sharman Cheek o Go to www.dietdoctor.com for explanations, recipes, and infographics about foods to eat/avoid o Get a Free smartphone app that helps count carbohydrates  - ie MyKeto EXAMPLES TO GET STARTED Fasting Beverages -water (can add  tsp Pink Himalayan salt once or twice a day to help stay hydrated for longer fasts) -  Sparkling water (such as [employer] or similar; avoid any with artificial sweeteners)  -Bone broth (multiple recipes available online or can buy pre-made) -Tea or Coffee (Adding heavy whipping cream or coconut oil to your tea or coffee can be helpful if you find yourself getting too hungry during the fasts. Can also add cinnamon for flavor. Or "bulletproof coffee.") Low Carb Healthy Fat Breakfast (if not fasting) -eggs in butter or olive oil with  avocado -omelet with veggies and cheese  Lunch -hamburger with cheese and avocado wrapped in lettuce (no bun, no ketchup) -meat and cheese wrapped in lettuce (can dip in mustard or olive oil/vinegar/mayo) -salad with meat/cheese/nuts and higher fat dressing (vinaigrette or Ranch, etc) -tuna salad lettuce wrap -taco meat with cheese, sour cream, guacamole, cheese over lettuce  Dinner -steak with herb butter or Barnaise sauce -"Fathead" pizza (uses cheese and almond flour for the dough - several recipes available online) -roasted or grilled chicken with skin on, with low carb sauce (buffalo, garlic butter, alfredo, pesto, etc) -baked salmon with lemon butter -chicken alfredo with zucchini noodles -[place] butter chicken with low carb garlic naan -egg roll in a bowl  Side Dishes -mashed cauliflower (homemade or available in freezer section) -roast vegetables (green veggies that grow above ground rather than root veggies) with butter or cheese -Caprese salad (fresh mozzarella, tomato and basil with olive oil) -homemade low-carb coleslaw Snacks/Desserts (try to avoid unnecessary snacking and sweets in general) -celery or cucumber dipped in guacamole or sour cream dip -cheese and meat slices  -raspberries with whipped cream (can make homemade with no sugar added) -low carb [place] butter cake  AVOID - sugar, diet/regular soda, potatoes, breads, rice, pasta, candy, cookies, cakes, muffins, juice, high carb fruit (bananas, grapes), beer, ketchup, barbeque and other sweet sauces

## 2016-10-29 NOTE — Progress Notes (Signed)
Angel Brewer, is a 51 y.o. female  DTO:[id number]  XIP:[id number]  DOB - February 21, 1966  Chief Complaint  Patient presents with  . Referral        Subjective:   Angel Brewer is a 51 y.o. female here today for a follow up visit for hx of dvt/PE, morbid obesity, htn.  Last seen 09/09/16. Pt has been following w/ Heme/onc and recent recommendation was that she could discontinue coumadin - which she has since stopped.  She has had problems losing weight, w/ no appreciable gains or losses recently despite numerous attempts. Her weight is unchanged since I last saw her.   Patient has No headache, No chest pain, No abdominal pain - No Nausea, No new weakness tingling or numbness, No Cough - SOB.  Problem  Morbid Obesity (Hcc)  Vitamin D Deficiency    ALLERGIES: No Known Allergies  PAST MEDICAL HISTORY: Past Medical History:  Diagnosis Date  . Morbid obesity ([place])     MEDICATIONS AT HOME: Prior to Admission medications   Medication Sig Start Date End Date Taking? Authorizing Provider  hydrochlorothiazide (HYDRODIURIL) 25 MG tablet Take 1 tablet (25 mg total) by mouth daily. 10/29/16  Yes Maren Reamer, MD  Vitamin D, Ergocalciferol, (DRISDOL) 50000 units CAPS capsule Take 1 capsule (50,000 Units total) by mouth every 7 (seven) days. Patient taking differently: Take 1,500 Units by mouth every 7 (seven) days.  09/10/16  Yes Maren Reamer, MD     Objective:   Vitals:   10/29/16 1104  BP: 132/87  Pulse: 73  Resp: 16  Temp: 98.3 F (36.8 C)  TempSrc: Oral  SpO2: 94%  Weight: (!) 433 lb 6.4 oz (196.6 kg)   bmi/ 65.9 Weight 09/09/16 433lbs  Exam General appearance : Awake, alert, not in any distress. Speech Clear. Not toxic looking, morbid obese. HEENT: Atraumatic and Normocephalic, pupils equally reactive to light. Neck: supple, no JVD.   Chest:Good air entry bilaterally, no added sounds. CVS: S1 S2 regular, no murmurs/gallups or rubs. Abdomen: Bowel sounds  active, Non tender, obese Extremities: B/L Lower Ext shows no edema, both legs are warm to touch; ROM - maneuverability onto examing table limited due to pt's significant morbidity. Neurology: Awake alert, and oriented X 3, CN II-XII grossly intact, Non focal Skin:No Rash  Data Review Lab Results  Component Value Date   HGBA1C 5.4 06/10/2016    Depression screen [facility] 2/9 10/29/2016 09/09/2016 03/19/2016  Decreased Interest 0 0 1  Down, Depressed, Hopeless 0 0 0  PHQ - 2 Score 0 0 1  Altered sleeping - - 1  Tired, decreased energy - - 1  Change in appetite - - 0  Feeling bad or failure about yourself  - - 0  Trouble concentrating - - 0  Moving slowly or fidgety/restless - - 0  Suicidal thoughts - - 0  PHQ-9 Score - - 3   03/02/17 echo Study Conclusions  - Left ventricle: The cavity size was normal. There was mild   concentric hypertrophy. Systolic function was normal. The   estimated ejection fraction was in the range of 50% to 55%. Wall   motion was normal; there were no regional wall motion   abnormalities. - Left atrium: The atrium was moderately dilated. - Right ventricle: The cavity size was moderately dilated. Wall   thickness was normal. Systolic function was moderately reduced. - Right atrium: The atrium was moderately dilated. - Pulmonary arteries: Systolic pressure was mildly increased. PA   peak  pressure: 35 mm Hg (S).   Assessment & Plan   1. Bilateral pulmonary embolism (HCC) - treated fully w/ coumadin. - now currently off per heme-onc recds. - Lipid Panel  2. Morbid obesity ([place]), bmi >65.9 - numerous failed attempts at weight loss, hx of mild increase in PA pressures as well from prior PEs presumed on echo 03/02/16.  Mobility is significantly limited due to her extreme obesity as well,  - Ambulatory referral to General Surgery - eval for bariatric surgery.  3. Vitamin D deficiency Continue vit d and calcium supplements  4. Pap smear for cervical cancer  screening - Ambulatory referral to Obstetrics / Gynecology - will need stirrups/retractors which our clinic does not have.  5. Encouraged to get colonoscopy - pt not ready -declined hiv screening as well.     Patient have been counseled extensively about nutrition and exercise  Return in about 3 months (around 01/28/2017), or if symptoms worsen or fail to improve.  The patient was given clear instructions to go to ER or return to medical center if symptoms don't improve, worsen or new problems develop. The patient verbalized understanding. The patient was told to call to get lab results if they haven't heard anything in the next week.   This note has been created with Surveyor, quantity. Any transcriptional errors are unintentional.   Maren Reamer, MD, [place] [facility] [place], [place]   10/29/2016, 12:17 PM

## 2016-10-30 LAB — LIPID PANEL
CHOLESTEROL TOTAL: 203 mg/dL — AB (ref 100–199)
Chol/HDL Ratio: 5.1 ratio — ABNORMAL HIGH (ref 0.0–4.4)
HDL: 40 mg/dL (ref >39)
LDL Calculated: 145 mg/dL — ABNORMAL HIGH (ref 0–99)
TRIGLYCERIDES: 89 mg/dL (ref 0–149)
VLDL CHOLESTEROL CAL: 18 mg/dL (ref 5–40)

## 2016-11-03 ENCOUNTER — Encounter: Payer: Self-pay | Admitting: Obstetrics & Gynecology

## 2016-11-03 ENCOUNTER — Other Ambulatory Visit: Payer: Self-pay | Admitting: Internal Medicine

## 2016-11-03 MED ORDER — PRAVASTATIN SODIUM 20 MG PO TABS: 20.0000 mg | ORAL_TABLET | Freq: Every day | ORAL | 3 refills | Status: DC

## 2016-11-05 ENCOUNTER — Telehealth: Payer: Self-pay

## 2016-11-05 NOTE — Telephone Encounter (Signed)
Contacted pt to go over lab results pt is aware of results and doesn't have any questions or concerns 

## 2016-11-11 DIAGNOSIS — G4733 Obstructive sleep apnea (adult) (pediatric): Secondary | ICD-10-CM | POA: Diagnosis not present

## 2016-11-24 ENCOUNTER — Encounter: Payer: Medicaid Other | Admitting: Obstetrics & Gynecology

## 2016-11-27 ENCOUNTER — Encounter: Payer: Self-pay | Admitting: Internal Medicine

## 2016-11-28 ENCOUNTER — Encounter: Payer: Self-pay | Admitting: Internal Medicine

## 2016-12-01 ENCOUNTER — Encounter: Payer: Self-pay | Admitting: Internal Medicine

## 2016-12-02 ENCOUNTER — Other Ambulatory Visit: Payer: Self-pay | Admitting: Internal Medicine

## 2016-12-10 ENCOUNTER — Ambulatory Visit (INDEPENDENT_AMBULATORY_CARE_PROVIDER_SITE_OTHER): Payer: Medicaid Other | Admitting: Obstetrics & Gynecology

## 2016-12-10 ENCOUNTER — Other Ambulatory Visit (HOSPITAL_COMMUNITY)
Admission: RE | Admit: 2016-12-10 | Discharge: 2016-12-10 | Disposition: A | Discharge status: Home / Self Care | Payer: Medicaid Other | Source: Ambulatory Visit | Attending: Obstetrics & Gynecology | Admitting: Obstetrics & Gynecology

## 2016-12-10 ENCOUNTER — Encounter: Payer: Self-pay | Admitting: Obstetrics & Gynecology

## 2016-12-10 VITALS — BP 92/73 | HR 78 | Ht 67.0 in | Wt >= 6400 oz

## 2016-12-10 DIAGNOSIS — Z01419 Encounter for gynecological examination (general) (routine) without abnormal findings: Secondary | ICD-10-CM | POA: Diagnosis not present

## 2016-12-10 DIAGNOSIS — N951 Menopausal and female climacteric states: Secondary | ICD-10-CM

## 2016-12-10 DIAGNOSIS — Z0000 Encounter for general adult medical examination without abnormal findings: Secondary | ICD-10-CM

## 2016-12-10 NOTE — Patient Instructions (Signed)
Advance Directive Advance directives are legal documents that let you make choices ahead of time about your health care and medical treatment in case you become unable to communicate for yourself. Advance directives are a way for you to communicate your wishes to family, friends, and health care providers. This can help convey your decisions about end-of-life care if you become unable to communicate. Discussing and writing advance directives should happen over time rather than all at once. Advance directives can be changed depending on your situation and what you want, even after you have signed the advance directives. If you do not have an advance directive, some states assign family decision makers to act on your behalf based on how closely you are related to them. Each state has its own laws regarding advance directives. You may want to check with your health care provider, attorney, or state representative about the laws in your state. There are different types of advance directives, such as:  Medical power of attorney.  Living will.  Do not resuscitate (DNR) or do not attempt resuscitation (DNAR) order. Health care proxy and medical power of attorney A health care proxy, also called a health care agent, is a person who is appointed to make medical decisions for you in cases in which you are unable to make the decisions yourself. Generally, people choose someone they know well and trust to represent their preferences. Make sure to ask this person for an agreement to act as your proxy. A proxy may have to exercise judgment in the event of a medical decision for which your wishes are not known. A medical power of attorney is a legal document that names your health care proxy. Depending on the laws in your state, after the document is written, it may also need to be:  Signed.  Notarized.  Dated.  Copied.  Witnessed.  Incorporated into your medical record. You may also want to appoint  someone to manage your financial affairs in a situation in which you are unable to do so. This is called a durable power of attorney for finances. It is a separate legal document from the durable power of attorney for health care. You may choose the same person or someone different from your health care proxy to act as your agent in financial matters. If you do not appoint a proxy, or if there is a concern that the proxy is not acting in your best interests, a court-appointed guardian may be designated to act on your behalf. Living will A living will is a set of instructions documenting your wishes about medical care when you cannot express them yourself. Health care providers should keep a copy of your living will in your medical record. You may want to give a copy to family members or friends. To alert caregivers in case of an emergency, you can place a card in your wallet to let them know that you have a living will and where they can find it. A living will is used if you become:  Terminally ill.  Incapacitated.  Unable to communicate or make decisions. Items to consider in your living will include:  The use or non-use of life-sustaining equipment, such as dialysis machines and breathing machines (ventilators).  A DNR or DNAR order, which is the instruction not to use cardiopulmonary resuscitation (CPR) if breathing or heartbeat stops.  The use or non-use of tube feeding.  Withholding of food and fluids.  Comfort (palliative) care when the goal becomes comfort rather than  a cure.  Organ and tissue donation. A living will does not give instructions for distributing your money and property if you should pass away. It is recommended that you seek the advice of a lawyer when writing a will. Decisions about taxes, beneficiaries, and asset distribution will be legally binding. This process can relieve your family and friends of any concerns surrounding disputes or questions that may come up about  the distribution of your assets. DNR or DNAR A DNR or DNAR order is a request not to have CPR in the event that your heart stops beating or you stop breathing. If a DNR or DNAR order has not been made and shared, a health care provider will try to help any patient whose heart has stopped or who has stopped breathing. If you plan to have surgery, talk with your health care provider about how your DNR or DNAR order will be followed if problems occur. Summary  Advance directives are the legal documents that allow you to make choices ahead of time about your health care and medical treatment in case you become unable to communicate for yourself.  The process of discussing and writing advance directives should happen over time. You can change the advance directives, even after you have signed them.  Advance directives include DNR or DNAR orders, living wills, and designating an agent as your medical power of attorney. This information is not intended to replace advice given to you by your health care provider. Make sure you discuss any questions you have with your health care provider. Document Released: 10/07/2007 Document Revised: 05/19/2016 Document Reviewed: 05/19/2016 Elsevier Interactive Patient Education  2017 [employer].

## 2016-12-10 NOTE — Progress Notes (Signed)
Subjective:     Angel Brewer is a 51 y.o. female here for a routine exam. P3. LMP >1 year prev Current complaints: no GYN complaints.  Pt not currently sexually active.    Gynecologic History No LMP recorded. Patient is not currently having periods (Reason: Perimenopausal). Contraception: tubal ligation Last Pap: 2-3 years prev. Results were: normal Last mammogram: 07/2016. Results were: normal  Obstetric History c-sections x3 BTL with last c/s  The following portions of the patient's history were reviewed and updated as appropriate: allergies, current medications, past family history, past medical history, past social history, past surgical history and problem list.  Review of Systems Pertinent items are noted in HPI.    Objective:  BP 92/73   Pulse 78   Ht 5' 7" (1.702 m)   Wt (!) 432 lb 9.6 oz (196.2 kg)   BMI 67.75 kg/m  General Appearance:    Alert, cooperative, no distress, appears stated age  Head:    Normocephalic, without obvious abnormality, atraumatic  Eyes:    conjunctiva/corneas clear, EOM's intact, both eyes  Ears:    Normal external ear canals, both ears  Nose:   Nares normal, septum midline, mucosa normal, no drainage    or sinus tenderness  Throat:   Lips, mucosa, and tongue normal; teeth and gums normal  Neck:   Supple, symmetrical, trachea midline, no adenopathy;    thyroid:  no enlargement/tenderness/nodules  Back:     Symmetric, no curvature, ROM normal, no CVA tenderness  Lungs:     Clear to auscultation bilaterally, respirations unlabored  Chest Wall:    No tenderness or deformity   Heart:    Regular rate and rhythm, S1 and S2 normal, no murmur, rub   or gallop  Breast Exam:    No tenderness, masses, or nipple abnormality  Abdomen:     Soft, non-tender, bowel sounds active all four quadrants,    no masses, no organomegaly; pt is morbidly obese  Genitalia:    Normal female without lesion, discharge or tenderness- exam challenging due to pts body  habitus.  Extra large Graves speculum used. 2 assistants utilized      Extremities:   Pt with excessive skin on left thigh.   Pulses:   2+ and symmetric all extremities  Skin:   Skin color, texture, turgor normal, no rashes or lesions      Assessment:    GYN exam with PAP Breast screening   Plan:    Follow up in: 1 year.    F/u PAP with hrHPV  Gayanne Prescott L. Harraway-Smith, M.D., [place]nn June

## 2016-12-12 DIAGNOSIS — G4733 Obstructive sleep apnea (adult) (pediatric): Secondary | ICD-10-CM | POA: Diagnosis not present

## 2016-12-15 LAB — CYTOLOGY - PAP
ADEQUACY: ABSENT
Diagnosis: NEGATIVE
HPV (WINDOPATH): NOT DETECTED

## 2016-12-16 ENCOUNTER — Telehealth: Payer: Self-pay | Admitting: Internal Medicine

## 2016-12-16 NOTE — Telephone Encounter (Signed)
Patient needs office visit. Please schedule an appt with new PCP - this medication is not chronic.

## 2016-12-16 NOTE — Telephone Encounter (Signed)
Pt. Called requesting a refill on Vitamin D, Ergocalciferol, (DRISDOL) 50000 units CAPS capsule Please f/u with pt.

## 2016-12-22 NOTE — Telephone Encounter (Signed)
PT called to find out about the Vitamin D,refill request, she was inform that she need a OV to get a refill, she said that she is not going to see a pcp just for a medication of Vitamin D, she prefer go to the pharmacy and buy them,.Marland KitchenMarland Kitchen

## 2017-01-11 DIAGNOSIS — G4733 Obstructive sleep apnea (adult) (pediatric): Secondary | ICD-10-CM | POA: Diagnosis not present

## 2017-01-23 DIAGNOSIS — I10 Essential (primary) hypertension: Secondary | ICD-10-CM | POA: Diagnosis not present

## 2017-01-23 DIAGNOSIS — Z6841 Body Mass Index (BMI) 40.0 and over, adult: Secondary | ICD-10-CM | POA: Diagnosis not present

## 2017-01-23 DIAGNOSIS — Z86711 Personal history of pulmonary embolism: Secondary | ICD-10-CM | POA: Diagnosis not present

## 2017-01-23 DIAGNOSIS — G4730 Sleep apnea, unspecified: Secondary | ICD-10-CM | POA: Diagnosis not present

## 2017-01-23 DIAGNOSIS — E785 Hyperlipidemia, unspecified: Secondary | ICD-10-CM | POA: Diagnosis not present

## 2017-02-03 DIAGNOSIS — Z136 Encounter for screening for cardiovascular disorders: Secondary | ICD-10-CM | POA: Diagnosis not present

## 2017-02-03 DIAGNOSIS — Z6841 Body Mass Index (BMI) 40.0 and over, adult: Secondary | ICD-10-CM | POA: Diagnosis not present

## 2017-02-03 DIAGNOSIS — G4733 Obstructive sleep apnea (adult) (pediatric): Secondary | ICD-10-CM | POA: Diagnosis not present

## 2017-02-03 DIAGNOSIS — Z9989 Dependence on other enabling machines and devices: Secondary | ICD-10-CM | POA: Diagnosis not present

## 2017-02-03 DIAGNOSIS — Z86711 Personal history of pulmonary embolism: Secondary | ICD-10-CM | POA: Diagnosis not present

## 2017-02-11 DIAGNOSIS — G4733 Obstructive sleep apnea (adult) (pediatric): Secondary | ICD-10-CM | POA: Diagnosis not present

## 2017-02-16 ENCOUNTER — Encounter: Payer: Self-pay | Admitting: Family Medicine

## 2017-02-16 ENCOUNTER — Ambulatory Visit: Payer: Medicaid Other | Attending: Family Medicine | Admitting: Family Medicine

## 2017-02-16 DIAGNOSIS — R809 Proteinuria, unspecified: Secondary | ICD-10-CM | POA: Diagnosis not present

## 2017-02-16 DIAGNOSIS — Z79899 Other long term (current) drug therapy: Secondary | ICD-10-CM | POA: Insufficient documentation

## 2017-02-16 DIAGNOSIS — M25562 Pain in left knee: Secondary | ICD-10-CM | POA: Diagnosis not present

## 2017-02-16 DIAGNOSIS — Z8639 Personal history of other endocrine, nutritional and metabolic disease: Secondary | ICD-10-CM | POA: Diagnosis not present

## 2017-02-16 DIAGNOSIS — Z86711 Personal history of pulmonary embolism: Secondary | ICD-10-CM | POA: Diagnosis not present

## 2017-02-16 DIAGNOSIS — E785 Hyperlipidemia, unspecified: Secondary | ICD-10-CM | POA: Diagnosis not present

## 2017-02-16 DIAGNOSIS — E559 Vitamin D deficiency, unspecified: Secondary | ICD-10-CM | POA: Diagnosis not present

## 2017-02-16 DIAGNOSIS — I10 Essential (primary) hypertension: Secondary | ICD-10-CM | POA: Insufficient documentation

## 2017-02-16 DIAGNOSIS — M25561 Pain in right knee: Secondary | ICD-10-CM | POA: Diagnosis not present

## 2017-02-16 LAB — POCT UA - MICROALBUMIN
CREATININE, POC: 300 mg/dL
MICROALBUMIN (UR) POC: 80 mg/L

## 2017-02-16 MED ORDER — ACETAMINOPHEN 500 MG PO TABS: 1000.0000 mg | ORAL_TABLET | Freq: Four times a day (QID) | ORAL | 0 refills | Status: DC | PRN

## 2017-02-16 MED ORDER — LISINOPRIL 2.5 MG PO TABS: 2.5000 mg | ORAL_TABLET | Freq: Every day | ORAL | 2 refills | Status: DC

## 2017-02-16 MED ORDER — HYDROCHLOROTHIAZIDE 25 MG PO TABS: 25.0000 mg | ORAL_TABLET | Freq: Every day | ORAL | 3 refills | Status: DC

## 2017-02-16 NOTE — Progress Notes (Signed)
Subjective:  Patient ID: Angel Brewer Dates, female    DOB: 1966-06-14  Age: 51 y.o. MRN: [id number]  CC: Hypertension   HPI Thurza Raimondo presents for history of HTN, obesity, and HLD. She reports upcoming bariatric surgery. History of weight bariatric services through [facility]. She reports weight loss interventions have included walking 1.5 miles BID, weight loss plants including [employer], Weight Watcher, and [employer]kins. History bilateral PE in the past. She denies any SOB or swelling of the extremities. She complaints of  presents with knee pain involving the  bilateral knee. Onset of the symptoms was yesterday. Inciting event: began after period of prolonged activity -- walking up hills.. Current symptoms include pain. Pain is aggravated by walking.  Evaluation to date: none. Treatment to date: avoidance of offending activity.   Outpatient Medications Prior to Visit  Medication Sig Dispense Refill  . hydrochlorothiazide (HYDRODIURIL) 25 MG tablet Take 1 tablet (25 mg total) by mouth daily. 90 tablet 3  . pravastatin (PRAVACHOL) 20 MG tablet Take 1 tablet (20 mg total) by mouth daily. 90 tablet 3  . Vitamin D, Ergocalciferol, (DRISDOL) 50000 units CAPS capsule Take 1 capsule (50,000 Units total) by mouth every 7 (seven) days. (Patient taking differently: Take 1,500 Units by mouth every 7 (seven) days. ) 12 capsule 0   No facility-administered medications prior to visit.     ROS Review of Systems  Constitutional: Negative.        Obese  Eyes: Negative.   Respiratory: Negative.   Cardiovascular: Negative.   Gastrointestinal: Negative.   Musculoskeletal: Positive for arthralgias (knees).  Skin: Negative.      Objective:  BP 104/69 (BP Location: Left Arm, Patient Position: Sitting, Cuff Size: Normal)   Pulse 82   Temp 97.8 F (36.6 C) (Oral)   Resp 18   Ht _0  (1.727 m)   Wt (!) 418 lb 6.4 oz (189.8 kg)   SpO2 94%   BMI 63.62 kg/m   BP/Weight 02/16/2017 12/10/2016 6/38/7564    Systolic BP 332 92 951  Diastolic BP 69 73 87  Wt. (Lbs) 418.4 432.6 433.4  BMI 63.62 67.75 65.9    Physical Exam  Constitutional: She appears well-developed and well-nourished.  obese  Eyes: Pupils are equal, round, and reactive to light. Conjunctivae are normal.  Neck: Normal range of motion. Neck supple. No JVD present.  Cardiovascular: Normal rate, regular rhythm, normal heart sounds and intact distal pulses.   Pulmonary/Chest: Effort normal and breath sounds normal.  Abdominal: Soft. Bowel sounds are normal. There is no tenderness.  Musculoskeletal:       Right knee: She exhibits normal range of motion and no swelling.       Left knee: She exhibits normal range of motion and no swelling.  Skin: Skin is warm and dry.  Nursing note and vitals reviewed.  Assessment & Plan:   Problem List Items Addressed This Visit      Other   Morbid obesity ([place]) - Primary    Other Visit Diagnoses    Acute pain of right knee       Relevant Medications   acetaminophen (TYLENOL) 500 MG tablet   Hypertension, benign       Relevant Medications   hydrochlorothiazide (HYDRODIURIL) 25 MG tablet   lisinopril (PRINIVIL,ZESTRIL) 2.5 MG tablet   Other Relevant Orders   POCT UA - Microalbumin (Completed)   Dyslipidemia       Relevant Orders   Lipid Panel (Completed)   History of vitamin  D deficiency       Relevant Orders   Vitamin D, 25-hydroxy (Completed)   Urine test positive for microalbuminuria       Relevant Medications   lisinopril (PRINIVIL,ZESTRIL) 2.5 MG tablet      Meds ordered this encounter  Medications  . hydrochlorothiazide (HYDRODIURIL) 25 MG tablet    Sig: Take 1 tablet (25 mg total) by mouth daily.    Dispense:  90 tablet    Refill:  3    Order Specific Question:   Supervising Provider    Answer:   Tresa Garter [id number]  . lisinopril (PRINIVIL,ZESTRIL) 2.5 MG tablet    Sig: Take 1 tablet (2.5 mg total) by mouth daily.    Dispense:  30 tablet    Refill:  2     Order Specific Question:   Supervising Provider    Answer:   Tresa Garter [id number]  . acetaminophen (TYLENOL) 500 MG tablet    Sig: Take 2 tablets (1,000 mg total) by mouth every 6 (six) hours as needed.    Dispense:  30 tablet    Refill:  0    Order Specific Question:   Supervising Provider    Answer:   Tresa Garter [id number]    Follow-up: Return in about 3 months (around 05/19/2017), or if symptoms worsen or fail to improve, for HTN.   Alfonse Spruce FNP

## 2017-02-16 NOTE — Progress Notes (Signed)
APatient is here for establish care: clarence for surgery   Patient complains knee pain

## 2017-02-16 NOTE — Patient Instructions (Addendum)
Microalbumin Test Why am I having this test? Albumin is a protein in your body that helps regulate how much water is in your blood. As your kidneys filter your blood to get rid of waste products through your urine, albumin should remain in your bloodstream. However, certain types of kidney disease can cause albumin to move from damaged blood vessels inside your kidneys and into your urine. When this happens, small amounts of albumin (microalbumin, MA) can be detected in your urine. This type of kidney damage is a common complication of diabetes mellitus, especially when blood sugar (glucose) has not been well controlled. The MA test may also help your health care provider diagnose other related medical conditions such as cardiovascular disease and high blood pressure. The MA test is often performed along with calculating urine creatinine levels. Creatinine is another waste product filtered out of your blood by your kidneys. You may have this test if:  You have diabetes and are showing signs of kidney damage.  Your health care provider wants to determine how well your blood glucose has been controlled over many years.  Your health care provider wants to determine your kidney function when other related tests are normal.  What kind of sample is taken? A urine sample is collected in a sterile container given to you by the lab. What are the reference values? Reference valuesare considered healthy valuesestablished after testing a large group of healthy people. Reference values may vary among different people, labs, and hospitals. It is your responsibility to obtain your test results. Ask the lab or department performing the test when and how you will get your results. A reference value for MA is any value less than 2 mg/L. A reference value for MA compared to creatinine is:  Men: less than 17 mg/g creatinine.  Women: less than 25 mg/g creatinine.  What do the results mean? MA test results that  are higher than the reference values may indicate many health conditions. These may include:  Diabetes mellitus.  Poorly controlled diabetes mellitus.  Myoglobulinuria.  Hemoglobinuria.  Bence-Jones proteinuria.  Use of medicines or drugs that are damaging to the kidneys.  Atherosclerosis.  Blood fat (lipid) abnormalities.  Insulin resistance.  High blood pressure.  Heart attack.  Talk with your health care provider to discuss your results, treatment options, and if necessary, the need for more tests. Talk with your health care provider if you have any questions about your results. Talk with your health care provider to discuss your results, treatment options, and if necessary, the need for more tests. Talk with your health care provider if you have any questions about your results. This information is not intended to replace advice given to you by your health care provider. Make sure you discuss any questions you have with your health care provider. Document Released: 08/02/2004 Document Revised: 03/05/2016 Document Reviewed: 11/18/2013 Elsevier Interactive Patient Education  2018 [employer].  Lisinopril tablets What is this medicine? LISINOPRIL (lyse [place] oh pril) is an ACE inhibitor. This medicine is used to treat high blood pressure and heart failure. It is also used to protect the heart immediately after a heart attack. This medicine may be used for other purposes; ask your health care provider or pharmacist if you have questions. COMMON BRAND NAME(S): Prinivil, Zestril What should I tell my health care provider before I take this medicine? They need to know if you have any of these conditions: -diabetes -heart or blood vessel disease -kidney disease -low blood pressure -  previous swelling of the tongue, face, or lips with difficulty breathing, difficulty swallowing, hoarseness, or tightening of the throat -an unusual or allergic reaction to lisinopril, other ACE  inhibitors, insect venom, foods, dyes, or preservatives -pregnant or trying to get pregnant -breast-feeding How should I use this medicine? Take this medicine by mouth with a glass of water. Follow the directions on your prescription label. You may take this medicine with or without food. If it upsets your stomach, take it with food. Take your medicine at regular intervals. Do not take it more often than directed. Do not stop taking except on your doctor's advice. Talk to your pediatrician regarding the use of this medicine in children. Special care may be needed. While this drug may be prescribed for children as young as 63 years of age for selected conditions, precautions do apply. Overdosage: If you think you have taken too much of this medicine contact a poison control center or emergency room at once. NOTE: This medicine is only for you. Do not share this medicine with others. What if I miss a dose? If you miss a dose, take it as soon as you can. If it is almost time for your next dose, take only that dose. Do not take double or extra doses. What may interact with this medicine? Do not take this medicine with any of the following medications: -hymenoptera venom -sacubitril; valsartan This medicines may also interact with the following medications: -aliskiren -angiotensin receptor blockers, like losartan or valsartan -certain medicines for diabetes -diuretics -everolimus -gold compounds -lithium -NSAIDs, medicines for pain and inflammation, like ibuprofen or naproxen -potassium salts or supplements -salt substitutes -sirolimus -temsirolimus This list may not describe all possible interactions. Give your health care provider a list of all the medicines, herbs, non-prescription drugs, or dietary supplements you use. Also tell them if you smoke, drink alcohol, or use illegal drugs. Some items may interact with your medicine. What should I watch for while using this medicine? Visit your  doctor or health care professional for regular check ups. Check your blood pressure as directed. Ask your doctor what your blood pressure should be, and when you should contact him or her. Do not treat yourself for coughs, colds, or pain while you are using this medicine without asking your doctor or health care professional for advice. Some ingredients may increase your blood pressure. Women should inform their doctor if they wish to become pregnant or think they might be pregnant. There is a potential for serious side effects to an unborn child. Talk to your health care professional or pharmacist for more information. Check with your doctor or health care professional if you get an attack of severe diarrhea, nausea and vomiting, or if you sweat a lot. The loss of too much body fluid can make it dangerous for you to take this medicine. You may get drowsy or dizzy. Do not drive, use machinery, or do anything that needs mental alertness until you know how this drug affects you. Do not stand or sit up quickly, especially if you are an older patient. This reduces the risk of dizzy or fainting spells. Alcohol can make you more drowsy and dizzy. Avoid alcoholic drinks. Avoid salt substitutes unless you are told otherwise by your doctor or health care professional. What side effects may I notice from receiving this medicine? Side effects that you should report to your doctor or health care professional as soon as possible: -allergic reactions like skin rash, itching or hives, swelling of  the hands, feet, face, lips, throat, or tongue -breathing problems -signs and symptoms of kidney injury like trouble passing urine or change in the amount of urine -signs and symptoms of increased potassium like muscle weakness; chest pain; or fast, irregular heartbeat -signs and symptoms of liver injury like dark yellow or brown urine; general ill feeling or flu-like symptoms; light-colored stools; loss of appetite; nausea;  right upper belly pain; unusually weak or tired; yellowing of the eyes or skin -signs and symptoms of low blood pressure like dizziness; feeling faint or lightheaded, falls; unusually weak or tired -stomach pain with or without nausea and vomiting Side effects that usually do not require medical attention (report to your doctor or health care professional if they continue or are bothersome): -changes in taste -cough -dizziness -fever -headache -sensitivity to light This list may not describe all possible side effects. Call your doctor for medical advice about side effects. You may report side effects to FDA at 1-800-FDA-1088. Where should I keep my medicine? Keep out of the reach of children. Store at room temperature between 15 and 30 degrees C (59 and 86 degrees F). Protect from moisture. Keep container tightly closed. Throw away any unused medicine after the expiration date. NOTE: This sheet is a summary. It may not cover all possible information. If you have questions about this medicine, talk to your doctor, pharmacist, or health care provider.  2018 Elsevier/Gold Standard (2015-08-20 12:52:35)

## 2017-02-17 ENCOUNTER — Telehealth: Payer: Self-pay | Admitting: *Deleted

## 2017-02-17 LAB — LIPID PANEL
CHOLESTEROL TOTAL: 149 mg/dL (ref 100–199)
Chol/HDL Ratio: 4 ratio (ref 0.0–4.4)
HDL: 37 mg/dL — AB (ref >39)
LDL Calculated: 98 mg/dL (ref 0–99)
TRIGLYCERIDES: 72 mg/dL (ref 0–149)
VLDL Cholesterol Cal: 14 mg/dL (ref 5–40)

## 2017-02-17 LAB — VITAMIN D 25 HYDROXY (VIT D DEFICIENCY, FRACTURES): Vit D, 25-Hydroxy: 26.6 ng/mL — ABNORMAL LOW (ref 30.0–100.0)

## 2017-02-17 NOTE — Telephone Encounter (Signed)
Patient verified DOB Patient is aware of paperwork being placed at the front for pickup. No further questions.

## 2017-02-24 ENCOUNTER — Telehealth: Payer: Self-pay | Admitting: Family Medicine

## 2017-02-24 NOTE — Telephone Encounter (Signed)
Patient called the office asking to speak with nurse, in regards to her labs results specifically for her vitamin D levels. Please follow up.  Thank you.

## 2017-02-25 ENCOUNTER — Other Ambulatory Visit: Payer: Self-pay | Admitting: Family Medicine

## 2017-02-25 ENCOUNTER — Telehealth: Payer: Self-pay

## 2017-02-25 DIAGNOSIS — E559 Vitamin D deficiency, unspecified: Secondary | ICD-10-CM

## 2017-02-25 MED ORDER — VITAMIN D (ERGOCALCIFEROL) 1.25 MG (50000 UNIT) PO CAPS: ORAL_CAPSULE | ORAL | 0 refills | Status: DC

## 2017-02-25 NOTE — Telephone Encounter (Signed)
CMA call regarding lab results  Patient did not answer & unable to leave message

## 2017-02-25 NOTE — Telephone Encounter (Signed)
Pt returned call to go over lab results. Pt is aware and doesn't have any questions or concerns

## 2017-02-25 NOTE — Telephone Encounter (Signed)
-----  Message from Alfonse Spruce, [place] sent at 02/25/2017  9:04 AM EDT ----- Cholesterol levels have improved with current dosage of pravastatin. Continue to eat a diet low in saturated fat. Limit your intake of fried foods, red meats, and whole milk. Remain active. Vitamin D level was low. Vitamin D helps to keep bones strong. You were prescribed ergocalciferol (capsules) to increase your vitamin-d level.  Recommend follow up in 3 months

## 2017-03-14 DIAGNOSIS — G4733 Obstructive sleep apnea (adult) (pediatric): Secondary | ICD-10-CM | POA: Diagnosis not present

## 2017-04-13 DIAGNOSIS — G4733 Obstructive sleep apnea (adult) (pediatric): Secondary | ICD-10-CM | POA: Diagnosis not present

## 2017-04-28 ENCOUNTER — Ambulatory Visit: Payer: Medicaid Other | Attending: Family Medicine | Admitting: Physician Assistant

## 2017-04-28 VITALS — BP 94/62 | HR 59 | Temp 97.6°F | Resp 18 | Ht 68.0 in | Wt 390.0 lb

## 2017-04-28 DIAGNOSIS — R200 Anesthesia of skin: Secondary | ICD-10-CM | POA: Insufficient documentation

## 2017-04-28 DIAGNOSIS — R202 Paresthesia of skin: Secondary | ICD-10-CM

## 2017-04-28 DIAGNOSIS — M79604 Pain in right leg: Secondary | ICD-10-CM | POA: Insufficient documentation

## 2017-04-28 DIAGNOSIS — Z79899 Other long term (current) drug therapy: Secondary | ICD-10-CM | POA: Diagnosis not present

## 2017-04-28 MED ORDER — GABAPENTIN 300 MG PO CAPS: 300.0000 mg | ORAL_CAPSULE | Freq: Every day | ORAL | 1 refills | Status: DC

## 2017-04-28 MED ORDER — MELOXICAM 15 MG PO TABS
15.0000 mg | ORAL_TABLET | Freq: Every day | ORAL | 1 refills | Status: DC
Start: 2017-04-28 — End: 2017-08-25

## 2017-04-28 MED ORDER — MELOXICAM 15 MG PO TABS: 15.0000 mg | ORAL_TABLET | Freq: Every day | ORAL | 0 refills | Status: DC

## 2017-04-28 NOTE — Progress Notes (Signed)
Patient ID: [name], female   DOB: [birth year], 51 y.o.   MRN: [id number]    Angel Brewer, is a 51 y.o. female  FWY:[id number]  YDX:[id number]  DOB - [birth year]  Subjective:  Chief Complaint and HPI: Angel Brewer is a 51 y.o. female here today to for R leg numbness that worsens with laying down and driving. No ibuprofen for the discomfort.  She describes it as more aggravating than painful.  Denies LBP.  NKI.  Numbness is progressively worse and now bad even when she tries to drive for short distances.  No problems moving bowels or bladder.  Paresthesias are R leg starting at hip down to knee, lateral aspect of leg only.  ROS:   Constitutional:  No f/c, No night sweats, No unexplained weight loss. EENT:  No vision changes, No blurry vision, No hearing changes. No mouth, throat, or ear problems.  Respiratory: No cough, No SOB Cardiac: No CP, no palpitations GI:  No abd pain, No N/V/D. GU: No Urinary s/sx Musculoskeletal: paresthesias Neuro: No headache, no dizziness, no motor weakness.  Skin: No rash Endocrine:  No polydipsia. No polyuria.  Psych: Denies SI/HI  No problems updated.  ALLERGIES: No Known Allergies  PAST MEDICAL HISTORY: Past Medical History:  Diagnosis Date  . Morbid obesity ([place])     MEDICATIONS AT HOME: Prior to Admission medications   Medication Sig Start Date End Date Taking? Authorizing Provider  acetaminophen (TYLENOL) 500 MG tablet Take 2 tablets (1,000 mg total) by mouth every 6 (six) hours as needed. 02/16/17  Yes Hairston, Maylon Peppers, FNP  hydrochlorothiazide (HYDRODIURIL) 25 MG tablet Take 1 tablet (25 mg total) by mouth daily. 02/16/17  Yes Hairston, Mandesia R, FNP  lisinopril (PRINIVIL,ZESTRIL) 2.5 MG tablet Take 1 tablet (2.5 mg total) by mouth daily. 02/16/17  Yes Hairston, Maylon Peppers, FNP  pravastatin (PRAVACHOL) 20 MG tablet Take 1 tablet (20 mg total) by mouth daily. 11/03/16  Yes Langeland, Leda Quail, MD  Vitamin D, Ergocalciferol, (DRISDOL)  50000 units CAPS capsule TAKE ONE CAPSULE BY MOUTH ONCE WEEKLY FOR  12 WEEKS. 02/25/17  Yes Hairston, Maylon Peppers, FNP  gabapentin (NEURONTIN) 300 MG capsule Take 1 capsule (300 mg total) by mouth at bedtime. X 2 weeks then prn numbness 04/28/17   Freeman Caldron M, PA-C  meloxicam (MOBIC) 15 MG tablet Take 1 tablet (15 mg total) by mouth daily. X 10 days then prn pain 04/28/17   Argentina Donovan, PA-C     Objective:  EXAM:   Vitals:   04/28/17 0849  BP: 94/62  Pulse: (!) 59  Resp: 18  Temp: 97.6 F (36.4 C)  TempSrc: Oral  SpO2: 97%  Weight: (!) 390 lb (176.9 kg)  Height: _0  (1.727 m)    General appearance : A&OX3. NAD. Non-toxic-appearing HEENT: Atraumatic and Normocephalic.  PERRLA. EOM intact.   Neck: supple, no JVD. No cervical lymphadenopathy. No thyromegaly Chest/Lungs:  Breathing-non-labored, Good air entry bilaterally, breath sounds normal without rales, rhonchi, or wheezing  CVS: S1 S2 regular, no murmurs, gallops, rubs  Exam limited by morbid obesity and inability to use exam table.  Sensory over R lateral thigh is diminished compared with other parts of the leg.  SLR neg B while sitting.  Unable to illicit any lower extremity reflexes from chair/sitting position.  There is severe lordosis of the lower back with +paraspinus spasm Extremities: Bilateral Lower Ext shows no edema, both legs are warm to touch with = pulse throughout Neurology:  CN II-XII  grossly intact, Non focal.   Psych:  TP linear. J/I WNL. Normal speech. Appropriate eye contact and affect.  Skin:  No Rash  Data Review Lab Results  Component Value Date   HGBA1C 5.4 06/10/2016     Assessment & Plan   1. Paresthesia R leg Consider MRI - gabapentin (NEURONTIN) 300 MG capsule; Take 1 capsule (300 mg total) by mouth at bedtime. X 2 weeks then prn numbness  Dispense: 90 capsule; Refill: 1 - Thyroid Panel With TSH  2. Pain in right leg - meloxicam (MOBIC) 15 MG tablet; Take 1 tablet (15 mg total)  by mouth daily. X 10 days then prn pain  Dispense: 30 tablet; Refill: 1  3.  Morbid obesity is a definite issue that is likely causing lumbar problems-concern for disc issues.     Patient have been counseled extensively about nutrition and exercise  Return in about 3 weeks (around 05/19/2017) for [facility] for R leg paresthesias.  The patient was given clear instructions to go to ER or return to medical center if symptoms don't improve, worsen or new problems develop. The patient verbalized understanding. The patient was told to call to get lab results if they haven't heard anything in the next week.     Freeman Caldron, PA-C [facility] and [facility], [place]   04/28/2017, 9:25 AM

## 2017-04-28 NOTE — Patient Instructions (Signed)
Paresthesia Paresthesia is a burning or prickling feeling. This feeling can happen in any part of the body. It often happens in the hands, arms, legs, or feet. Usually, it is not painful. In most cases, the feeling goes away in a short time and is not a sign of a serious problem. Follow these instructions at home:  Avoid drinking alcohol.  Try massage or needle therapy (acupuncture) to help with your problems.  Keep all follow-up visits as told by your doctor. This is important. Contact a doctor if:  You keep on having episodes of paresthesia.  Your burning or prickling feeling gets worse when you walk.  You have pain or cramps.  You feel dizzy.  You have a rash. Get help right away if:  You feel weak.  You have trouble walking or moving.  You have problems speaking, understanding, or seeing.  You feel confused.  You cannot control when you pee (urinate) or poop (bowel movement).  You lose feeling (numbness) after an injury.  You pass out (faint). This information is not intended to replace advice given to you by your health care provider. Make sure you discuss any questions you have with your health care provider. Document Released: 06/12/2008 Document Revised: 12/06/2015 Document Reviewed: 06/26/2014 Elsevier Interactive Patient Education  [employer].

## 2017-04-29 LAB — THYROID PANEL WITH TSH
FREE THYROXINE INDEX: 1.8 (ref 1.2–4.9)
T3 UPTAKE RATIO: 23 % — AB (ref 24–39)
T4, Total: 7.9 ug/dL (ref 4.5–12.0)
TSH: 1.26 u[IU]/mL (ref 0.450–4.500)

## 2017-04-30 ENCOUNTER — Telehealth: Payer: Self-pay | Admitting: *Deleted

## 2017-04-30 NOTE — Telephone Encounter (Signed)
-----  Message from Argentina Donovan, [place] sent at 04/29/2017 10:14 PM EDT ----- Please call patient.  Her thyroid function is normal.  Follow up as planned.  Thanks, Freeman Caldron, PA-C

## 2017-04-30 NOTE — Telephone Encounter (Signed)
Patient is aware of thyroid being normal. Medical Assistant left message on patient's home and cell voicemail. Voicemail states to give a call back to [place] with [facility] at [phone].

## 2017-05-14 DIAGNOSIS — G4733 Obstructive sleep apnea (adult) (pediatric): Secondary | ICD-10-CM | POA: Diagnosis not present

## 2017-05-22 ENCOUNTER — Ambulatory Visit: Payer: Medicaid Other | Admitting: Family Medicine

## 2017-06-14 ENCOUNTER — Other Ambulatory Visit: Payer: Self-pay | Admitting: Family Medicine

## 2017-06-14 DIAGNOSIS — E559 Vitamin D deficiency, unspecified: Secondary | ICD-10-CM

## 2017-08-25 ENCOUNTER — Encounter: Payer: Self-pay | Admitting: Nurse Practitioner

## 2017-08-25 ENCOUNTER — Ambulatory Visit: Payer: Medicaid Other | Attending: Nurse Practitioner | Admitting: Nurse Practitioner

## 2017-08-25 VITALS — BP 150/81 | HR 58 | Temp 97.7°F | Resp 12 | Ht 68.0 in | Wt 375.8 lb

## 2017-08-25 DIAGNOSIS — I10 Essential (primary) hypertension: Secondary | ICD-10-CM | POA: Insufficient documentation

## 2017-08-25 DIAGNOSIS — E559 Vitamin D deficiency, unspecified: Secondary | ICD-10-CM | POA: Insufficient documentation

## 2017-08-25 DIAGNOSIS — Z9889 Other specified postprocedural states: Secondary | ICD-10-CM | POA: Insufficient documentation

## 2017-08-25 DIAGNOSIS — Z79899 Other long term (current) drug therapy: Secondary | ICD-10-CM | POA: Diagnosis not present

## 2017-08-25 DIAGNOSIS — R030 Elevated blood-pressure reading, without diagnosis of hypertension: Secondary | ICD-10-CM | POA: Diagnosis present

## 2017-08-25 DIAGNOSIS — Z6841 Body Mass Index (BMI) 40.0 and over, adult: Secondary | ICD-10-CM | POA: Diagnosis not present

## 2017-08-25 DIAGNOSIS — G4733 Obstructive sleep apnea (adult) (pediatric): Secondary | ICD-10-CM | POA: Diagnosis not present

## 2017-08-25 DIAGNOSIS — G8929 Other chronic pain: Secondary | ICD-10-CM | POA: Diagnosis not present

## 2017-08-25 DIAGNOSIS — E7849 Other hyperlipidemia: Secondary | ICD-10-CM | POA: Insufficient documentation

## 2017-08-25 DIAGNOSIS — M25562 Pain in left knee: Secondary | ICD-10-CM | POA: Diagnosis not present

## 2017-08-25 HISTORY — DX: Essential (primary) hypertension: I10

## 2017-08-25 MED ORDER — DICLOFENAC SODIUM 1 % TD GEL: 2.0000 g | Freq: Four times a day (QID) | TRANSDERMAL | 3 refills | Status: AC

## 2017-08-25 NOTE — Progress Notes (Signed)
Assessment & Plan:  Diagnoses and all orders for this visit:  Essential hypertension -     CBC -     Basic metabolic panel Blood pressure is elevated today. Will recheck in 2 weeks. Restart HCTZ per patient request at that time if BP is elevated. DASH DIET   Morbid obesity ([place]) Discussed diet and exercise for person with BMI >25. Instructed: You must burn more calories than you eat. Losing 5 percent of your body weight should be considered a success. In the longer term, losing more than 15 percent of your body weight and staying at this weight is an extremely good result. However, keep in mind that even losing 5 percent of your body weight leads to important health benefits, so try not to get discouraged if you're not able to lose more than this. Will recheck weight in 3-6 months.  Other hyperlipidemia -     Lipid panel Work on a low fat, heart healthy diet and participate in regular aerobic exercise program to control as well by working out at least150 minutes per week. No fried foods. No junk foods, sodas, sugary drinks, unhealthy snacking, or smoking.   Vitamin D deficiency -     VITAMIN D 25 Hydroxy (Vit-D Deficiency, Fractures)  Chronic pain of left knee -     diclofenac sodium (VOLTAREN) 1 % GEL; Apply 2 g topically 4 (four) times daily. May also take XS tylenol for pain.  Patient has been counseled on age-appropriate routine health concerns for screening and prevention. These are reviewed and up-to-date. Referrals have been placed accordingly. Immunizations are up-to-date or declined.    Subjective:   Chief Complaint  Patient presents with  . Establish Care  . Knee Pain   HPI Angel Brewer 52 y.o. female presents to office today to establish care.  Left Knee Pain Patient presents with knee pain involving the  left knee. Onset of the symptoms was several years ago. Inciting event: none known, this is a longstanding problem which has been getting worse. Current  symptoms include pain located in the left thigh due to large lipoma as well as left patella, stiffness and swelling. Pain is aggravated by any weight bearing and going up and down stairs.  Patient has had prior knee problems. Evaluation to date: plain films: abnormal with degenerative changes and PT evaluation with treatment. Treatment to date: prescription NSAIDS which are not very effective, PT which was not very effective, rest and weight loss.  Morbid Obesity She is being managed by the bariatric clinic (Dr. Evorn Gong) for weight loss and is planning bariatric surgery (laproscopic roux en Y gastric bypass). Her BMI is 57. Per notes reviewed: BMI should be 50 or less prior to surgery. She has a history of dyslipidemia, OSA on CPAP and today with complaints of knee pain L>R.  .   Essential Hypertension It appears she had an elevated blood pressure reading a few years ago and was placed on HCTZ 71m daily. She states today that she stopped taking HCTZ 2 months ago because she has never had any problems with her blood pressure. Her blood pressure is elevated today. She believes this is an isolated event. I will have her return for a repeat blood pressure check and is blood pressure is still elevated she agrees to restart HCTZ. Denies chest pain, palpitations, lightheadedness, dizziness, headaches or visual disturbances.   She has been exercising and making healthier choices in regards to her dietary intake.  BP Readings from Last  3 Encounters:  08/25/17 (!) 150/81  04/28/17 94/62  02/16/17 104/69    Hyperlipidemia She stopped taking her pravastin about 2 months ago. Awaiting fasting lipid panel results. She has agreed to restart her statin if her lipids are elevated. She denies any statin intolerance or myalgias.   Vitamin D Deficiency She has not taken Vitamin D since last year. Will recheck vitamin D today. Her prescription for 50,000 units weekly ran out some time ago. She does not take Vitamin D  OTC.       Review of Systems  Constitutional: Positive for weight loss (intentional). Negative for fever and malaise/fatigue.  HENT: Negative.  Negative for nosebleeds.   Eyes: Negative.  Negative for blurred vision, double vision and photophobia.  Respiratory: Negative.  Negative for cough, shortness of breath (with exertion; she is morbidly obese) and wheezing.   Cardiovascular: Negative.  Negative for chest pain, palpitations and leg swelling.  Gastrointestinal: Negative.  Negative for abdominal pain, constipation, diarrhea, heartburn, nausea and vomiting.  Genitourinary: Negative.   Musculoskeletal: Positive for joint pain. Negative for myalgias.  Neurological: Negative.  Negative for dizziness, focal weakness, seizures and headaches.  Endo/Heme/Allergies: Negative for environmental allergies.  Psychiatric/Behavioral: Negative.  Negative for suicidal ideas.    Past Medical History:  Diagnosis Date  . Morbid obesity ([place])     Past Surgical History:  Procedure Laterality Date  . CESAREAN SECTION      No family history on file.  Social History Reviewed with no changes to be made today.   Outpatient Medications Prior to Visit  Medication Sig Dispense Refill  . acetaminophen (TYLENOL) 500 MG tablet Take 2 tablets (1,000 mg total) by mouth every 6 (six) hours as needed. (Patient not taking: Reported on 08/25/2017) 30 tablet 0  . hydrochlorothiazide (HYDRODIURIL) 25 MG tablet Take 1 tablet (25 mg total) by mouth daily. (Patient not taking: Reported on 08/25/2017) 90 tablet 3  . pravastatin (PRAVACHOL) 20 MG tablet Take 1 tablet (20 mg total) by mouth daily. (Patient not taking: Reported on 08/25/2017) 90 tablet 3  . Vitamin D, Ergocalciferol, (DRISDOL) 50000 units CAPS capsule TAKE ONE CAPSULE BY MOUTH ONCE WEEKLY FOR  12 WEEKS. (Patient not taking: Reported on 08/25/2017) 12 capsule 0  . gabapentin (NEURONTIN) 300 MG capsule Take 1 capsule (300 mg total) by mouth at bedtime. X 2  weeks then prn numbness (Patient not taking: Reported on 08/25/2017) 90 capsule 1  . lisinopril (PRINIVIL,ZESTRIL) 2.5 MG tablet Take 1 tablet (2.5 mg total) by mouth daily. (Patient not taking: Reported on 08/25/2017) 30 tablet 2  . meloxicam (MOBIC) 15 MG tablet Take 1 tablet (15 mg total) by mouth daily. X 10 days then prn pain (Patient not taking: Reported on 08/25/2017) 30 tablet 1   No facility-administered medications prior to visit.     No Known Allergies     Objective:    BP (!) 150/81 (BP Location: Left Arm, Patient Position: Sitting, Cuff Size: Large)   Pulse (!) 58   Temp 97.7 F (36.5 C) (Oral)   Resp 12   Ht _0  (1.727 m)   Wt (!) 375 lb 12.8 oz (170.5 kg)   LMP 11/29/2015   SpO2 96%   BMI 57.14 kg/m  Wt Readings from Last 3 Encounters:  08/25/17 (!) 375 lb 12.8 oz (170.5 kg)  04/28/17 (!) 390 lb (176.9 kg)  02/16/17 (!) 418 lb 6.4 oz (189.8 kg)    Physical Exam  Constitutional: She is oriented to person,  place, and time. She appears well-developed and well-nourished. She is cooperative.  HENT:  Head: Normocephalic and atraumatic.  Eyes: EOM are normal.  Neck: Normal range of motion.  Cardiovascular: Normal rate, regular rhythm and normal heart sounds. Exam reveals no gallop and no friction rub.  No murmur heard. Pulmonary/Chest: Effort normal and breath sounds normal. No tachypnea. No respiratory distress. She has no decreased breath sounds. She has no wheezes. She has no rhonchi. She has no rales. She exhibits no tenderness.  Abdominal: Soft. Bowel sounds are normal.  Musculoskeletal: Normal range of motion. She exhibits no edema.       Legs: Neurological: She is alert and oriented to person, place, and time. Coordination normal.  Skin: Skin is warm and dry.  Psychiatric: She has a normal mood and affect. Her behavior is normal. Judgment and thought content normal.  Nursing note and vitals reviewed.        Patient has been counseled extensively about  nutrition and exercise as well as the importance of adherence with medications and regular follow-up. The patient was given clear instructions to go to ER or return to medical center if symptoms don't improve, worsen or new problems develop. The patient verbalized understanding.   Follow-up: Return in about 2 weeks (around 09/08/2017).   [name], FNP-BC [facility] and [facility], [place]   08/25/2017, 2:04 PM

## 2017-08-25 NOTE — Progress Notes (Signed)
Left knee pain

## 2017-08-26 LAB — LIPID PANEL
CHOL/HDL RATIO: 4.3 ratio (ref 0.0–4.4)
CHOLESTEROL TOTAL: 178 mg/dL (ref 100–199)
HDL: 41 mg/dL (ref >39)
LDL Calculated: 120 mg/dL — ABNORMAL HIGH (ref 0–99)
Triglycerides: 83 mg/dL (ref 0–149)
VLDL Cholesterol Cal: 17 mg/dL (ref 5–40)

## 2017-08-26 LAB — BASIC METABOLIC PANEL
BUN/Creatinine Ratio: 10 (ref 9–23)
BUN: 8 mg/dL (ref 6–24)
CALCIUM: 9.1 mg/dL (ref 8.7–10.2)
CO2: 26 mmol/L (ref 20–29)
Chloride: 106 mmol/L (ref 96–106)
Creatinine, Ser: 0.78 mg/dL (ref 0.57–1.00)
GFR calc Af Amer: 101 mL/min/{1.73_m2} (ref >59)
GFR, EST NON AFRICAN AMERICAN: 88 mL/min/{1.73_m2} (ref >59)
GLUCOSE: 81 mg/dL (ref 65–99)
POTASSIUM: 4 mmol/L (ref 3.5–5.2)
SODIUM: 142 mmol/L (ref 134–144)

## 2017-08-26 LAB — CBC
HEMATOCRIT: 38.7 % (ref 34.0–46.6)
HEMOGLOBIN: 12 g/dL (ref 11.1–15.9)
MCH: 28.2 pg (ref 26.6–33.0)
MCHC: 31 g/dL — ABNORMAL LOW (ref 31.5–35.7)
MCV: 91 fL (ref 79–97)
Platelets: 251 10*3/uL (ref 150–379)
RBC: 4.26 x10E6/uL (ref 3.77–5.28)
RDW: 14.2 % (ref 12.3–15.4)
WBC: 3.9 10*3/uL (ref 3.4–10.8)

## 2017-08-26 LAB — VITAMIN D 25 HYDROXY (VIT D DEFICIENCY, FRACTURES): Vit D, 25-Hydroxy: 22.9 ng/mL — ABNORMAL LOW (ref 30.0–100.0)

## 2017-08-27 ENCOUNTER — Telehealth: Payer: Self-pay

## 2017-08-27 NOTE — Telephone Encounter (Signed)
-----  Message from [name], NP sent at 08/26/2017  1:11 PM EST ----- Labs are essentially normal however your LDL cholesterol is elevated at 120 with goal less than 90. Work on a low fat, heart healthy diet and participate in regular aerobic exercise program to control as well by working out at least150 minutes per week. No fried foods. No junk foods, sodas, sugary drinks, unhealthy snacking, or smoking.

## 2017-08-27 NOTE — Telephone Encounter (Signed)
CMA called patient to inform on lab results and PCP advising.   Patient understood.

## 2017-09-11 DIAGNOSIS — R2242 Localized swelling, mass and lump, left lower limb: Secondary | ICD-10-CM | POA: Diagnosis not present

## 2019-03-01 ENCOUNTER — Telehealth: Payer: Self-pay

## 2019-03-01 NOTE — Telephone Encounter (Signed)
Patient appears to have been seen most recently by Z. Raul Del, NP. I am not in the office today. Please route to one of the providers who are currently in office today

## 2019-03-01 NOTE — Telephone Encounter (Signed)
Pt called request seen today for difficulty breathing waking her at night. Instructed pt to go to ED if symptoms immediate & urgent. Pt says she knows to do that but not emergent for that yet. Pt has appt 03/22/19 but request provider see pt today if possible. Pt states she can be reached at [phone].

## 2019-03-22 ENCOUNTER — Other Ambulatory Visit: Payer: Self-pay

## 2019-03-22 ENCOUNTER — Ambulatory Visit: Payer: Medicaid Other | Attending: Family Medicine | Admitting: Family Medicine

## 2019-03-22 ENCOUNTER — Encounter: Payer: Self-pay | Admitting: Family Medicine

## 2019-03-22 VITALS — BP 108/72 | HR 74 | Temp 98.2°F | Ht 68.0 in | Wt >= 6400 oz

## 2019-03-22 DIAGNOSIS — R0602 Shortness of breath: Secondary | ICD-10-CM | POA: Insufficient documentation

## 2019-03-22 DIAGNOSIS — Z86711 Personal history of pulmonary embolism: Secondary | ICD-10-CM | POA: Insufficient documentation

## 2019-03-22 DIAGNOSIS — I10 Essential (primary) hypertension: Secondary | ICD-10-CM | POA: Diagnosis not present

## 2019-03-22 DIAGNOSIS — Z79899 Other long term (current) drug therapy: Secondary | ICD-10-CM | POA: Insufficient documentation

## 2019-03-22 DIAGNOSIS — D6851 Activated protein C resistance: Secondary | ICD-10-CM | POA: Diagnosis not present

## 2019-03-22 DIAGNOSIS — R0609 Other forms of dyspnea: Secondary | ICD-10-CM | POA: Diagnosis not present

## 2019-03-22 DIAGNOSIS — Z6841 Body Mass Index (BMI) 40.0 and over, adult: Secondary | ICD-10-CM | POA: Insufficient documentation

## 2019-03-22 MED ORDER — ALBUTEROL SULFATE HFA 108 (90 BASE) MCG/ACT IN AERS: 2.0000 | INHALATION_SPRAY | Freq: Four times a day (QID) | RESPIRATORY_TRACT | 0 refills | Status: DC | PRN

## 2019-03-22 MED ORDER — BENZONATATE 100 MG PO CAPS: 100.0000 mg | ORAL_CAPSULE | Freq: Two times a day (BID) | ORAL | 0 refills | Status: DC | PRN

## 2019-03-22 NOTE — Progress Notes (Signed)
Subjective:  Patient ID: [name], female    DOB: 09-19-1965  Age: 53 y.o. MRN: [id number]  CC: Shortness of Breath and Hypertension   HPI Angel Brewer is a 53 year old female with previous history of PE, morbid obesity who presents with a 3-week history of dyspnea which she describes as "difficulty breathing and then she has to cough while breathing".  She also thinks she sometimes wheezes. Cough is dry and she denies postnasal drip, sinus congestion, fever, body aches, fatigue. She denies GI symptoms of abnormal sense of smell or taste and denies sick contacts. She is on her feet all day at work and does not get to exercise or go walking. Denies family history of hypercoagulable disorder.  In 2017 she was diagnosed with acute pulmonary embolism but states this was after a prolonged road trip to [place].  Past Medical History:  Diagnosis Date  . Morbid obesity ([place])     Past Surgical History:  Procedure Laterality Date  . CESAREAN SECTION      History reviewed. No pertinent family history.  No Known Allergies  Outpatient Medications Prior to Visit  Medication Sig Dispense Refill  . acetaminophen (TYLENOL) 500 MG tablet Take 2 tablets (1,000 mg total) by mouth every 6 (six) hours as needed. (Patient not taking: Reported on 08/25/2017) 30 tablet 0  . hydrochlorothiazide (HYDRODIURIL) 25 MG tablet Take 1 tablet (25 mg total) by mouth daily. (Patient not taking: Reported on 08/25/2017) 90 tablet 3  . pravastatin (PRAVACHOL) 20 MG tablet Take 1 tablet (20 mg total) by mouth daily. (Patient not taking: Reported on 08/25/2017) 90 tablet 3  . Vitamin D, Ergocalciferol, (DRISDOL) 50000 units CAPS capsule TAKE ONE CAPSULE BY MOUTH ONCE WEEKLY FOR  12 WEEKS. (Patient not taking: Reported on 08/25/2017) 12 capsule 0   No facility-administered medications prior to visit.      ROS Review of Systems  Objective:  BP 108/72   Pulse 74   Temp 98.2 F (36.8 C) (Oral)   Ht _0   (1.727 m)   Wt (!) 401 lb (181.9 kg)   LMP 11/29/2015   SpO2 93%   BMI 60.97 kg/m   BP/Weight 03/22/2019 08/25/2017 41/66/0630  Systolic BP 160 109 94  Diastolic BP 72 81 62  Wt. (Lbs) 401 375.8 390  BMI 60.97 57.14 59.3      Physical Exam Constitutional:      Appearance: She is well-developed.  Cardiovascular:     Rate and Rhythm: Normal rate.     Heart sounds: Normal heart sounds. No murmur.  Pulmonary:     Effort: Pulmonary effort is normal.     Breath sounds: Normal breath sounds. No wheezing or rales.  Chest:     Chest wall: No tenderness.  Abdominal:     General: Bowel sounds are normal. There is no distension.     Palpations: Abdomen is soft. There is no mass.     Tenderness: There is no abdominal tenderness.  Musculoskeletal: Normal range of motion.  Neurological:     Mental Status: She is alert and oriented to person, place, and time.     CMP Latest Ref Rng & Units 08/25/2017 09/09/2016 03/19/2016  Glucose 65 - 99 mg/dL 81 90 82  BUN 6 - 24 mg/dL _1 Creatinine 0.57 - 1.00 mg/dL 0.78 0.96 0.94  Sodium 134 - 144 mmol/L 142 138 138  Potassium 3.5 - 5.2 mmol/L 4.0 3.9 4.2  Chloride 96 - 106 mmol/L 106 100  102  CO2 20 - 29 mmol/L _0 Calcium 8.7 - 10.2 mg/dL 9.1 9.0 9.2  Total Protein 6.5 - 8.1 g/dL - - -  Total Bilirubin 0.3 - 1.2 mg/dL - - -  Alkaline Phos 38 - 126 U/L - - -  AST 15 - 41 U/L - - -  ALT 14 - 54 U/L - - -    Lipid Panel     Component Value Date/Time   CHOL 178 08/25/2017 1010   TRIG 83 08/25/2017 1010   HDL 41 08/25/2017 1010   CHOLHDL 4.3 08/25/2017 1010   LDLCALC 120 (H) 08/25/2017 1010    CBC    Component Value Date/Time   WBC 3.9 08/25/2017 1010   WBC 5.4 09/09/2016 0911   RBC 4.26 08/25/2017 1010   RBC 4.64 09/09/2016 0911   HGB 12.0 08/25/2017 1010   HCT 38.7 08/25/2017 1010   PLT 251 08/25/2017 1010   MCV 91 08/25/2017 1010   MCH 28.2 08/25/2017 1010   MCH 28.2 09/09/2016 0911   MCHC 31.0 (L) 08/25/2017 1010    MCHC 32.1 09/09/2016 0911   RDW 14.2 08/25/2017 1010   LYMPHSABS 1,728 09/09/2016 0911   MONOABS 378 09/09/2016 0911   EOSABS 162 09/09/2016 0911   BASOSABS 54 09/09/2016 0911    Lab Results  Component Value Date   HGBA1C 5.4 06/10/2016    Assessment & Plan:   1. History of pulmonary embolism We will start with preliminary test-d-dimer and chest x-ray given previous history of pulmonary embolism and oxygen saturation is low at 93% We will also send of hypercoagulable panel She has been advised that if symptoms worsen in the next 24 hours she needs to go to the ED or give [place] a call as she would need a chest CTA - albuterol (VENTOLIN HFA) 108 (90 Base) MCG/ACT inhaler; Inhale 2 puffs into the lungs every 6 (six) hours as needed for wheezing or shortness of breath.  Dispense: 18 g; Refill: 0 - D-dimer, quantitative (not at [facility]) - Factor V Leiden - Homocysteine - Lupus anticoagulant panel - Protein C, total - Protein S, total - DG Chest 2 View; Future  2. Other form of dyspnea Advised to need to self isolate until COVID-19 test results are back - albuterol (VENTOLIN HFA) 108 (90 Base) MCG/ACT inhaler; Inhale 2 puffs into the lungs every 6 (six) hours as needed for wheezing or shortness of breath.  Dispense: 18 g; Refill: 0 - Novel Coronavirus, NAA ([facility]) - DG Chest 2 View; Future    Meds ordered this encounter  Medications  . albuterol (VENTOLIN HFA) 108 (90 Base) MCG/ACT inhaler    Sig: Inhale 2 puffs into the lungs every 6 (six) hours as needed for wheezing or shortness of breath.    Dispense:  18 g    Refill:  0  . benzonatate (TESSALON) 100 MG capsule    Sig: Take 1 capsule (100 mg total) by mouth 2 (two) times daily as needed for cough.    Dispense:  20 capsule    Refill:  0    Follow-up: Return in about 3 weeks (around 04/12/2019) for follow up of dyspne with PCP.       Charlott Rakes, MD, FAAFP. [facility] and [place]  [place], [place]   03/22/2019, 3:15 PM

## 2019-03-22 NOTE — Progress Notes (Signed)
Patient has been having breathing trouble for 3 weeks.

## 2019-03-22 NOTE — Patient Instructions (Signed)
° ° °  Person Under Monitoring Name: Angel Brewer  Location: [address] [place] [address]   CORONAVIRUS DISEASE 2019 (COVID-19) Guidance for Persons Under Investigation You are being tested for the virus that causes coronavirus disease 2019 (COVID-19). Public health actions are necessary to ensure protection of your health and the health of others, and to prevent further spread of infection. COVID-19 is caused by a virus that can cause symptoms, such as fever, cough, and shortness of breath. The primary transmission from person to person is by coughing or sneezing. On August 12, 2018, the [place] announced a [employer] Emergency of International Concern and on August 13, 2018 the U.S. Department of Health and Human Services declared a public health emergency. If the virus that causesCOVID-19 spreads in the community, it could have severe public health consequences.  As a person under investigation for COVID-19, the [place] advises you to adhere to the following guidance until your test results are reported to you. If your test result is positive, you will receive additional information from your provider and your local health department at that time.   Remain at home until you are cleared by your health provider or public health authorities.   Keep a log of visitors to your home using the form provided. Any visitors to your home must be aware of your isolation status.  If you plan to move to a new address or leave the county, notify the local health department in your county.  Call a doctor or seek care if you have an urgent medical need. Before seeking medical care, call ahead and get instructions from the provider before arriving at the medical office, clinic or hospital. Notify them that you are being tested for the virus that causes COVID-19 so arrangements can be made, as  necessary, to prevent transmission to others in the healthcare setting. Next, notify the local health department in your county.  If a medical emergency arises and you need to call 911, inform the first responders that you are being tested for the virus that causes COVID-19. Next, notify the local health department in your county.  Adhere to all guidance set forth by the [place] for [facility] of patients that is based on guidance from the Center for Disease Control and Prevention with suspected or confirmed COVID-19. It is provided with this guidance for Persons Under Investigation.  Your health and the health of our community are our top priorities. Public Health officials remain available to provide assistance and counseling to you about COVID-19 and compliance with this guidance.  Provider: ____________________________________________________________ Date: ______/_____/_________  By signing below, you acknowledge that you have read and agree to comply with this Guidance for Persons Under Investigation. ______________________________________________________________ Date: ______/_____/_________  WHO DO I CALL? You can find a list of local health departments here: [email or web address] Health Department: ____________________________________________________________________ Contact Name: ________________________________________________________________________ Telephone: ___________________________________________________________________________  Marice Potter, [place], Communicable Disease Branch COVID-19 Guidance for Persons Under Investigation September 18, 2018

## 2019-03-23 ENCOUNTER — Other Ambulatory Visit: Payer: Self-pay | Admitting: Family Medicine

## 2019-03-23 DIAGNOSIS — Z86711 Personal history of pulmonary embolism: Secondary | ICD-10-CM

## 2019-03-23 DIAGNOSIS — R0609 Other forms of dyspnea: Secondary | ICD-10-CM

## 2019-03-24 LAB — NOVEL CORONAVIRUS, NAA: SARS-CoV-2, NAA: NOT DETECTED

## 2019-03-25 ENCOUNTER — Telehealth: Payer: Self-pay

## 2019-03-25 NOTE — Telephone Encounter (Signed)
Patient returned phone call and was informed of lab results an appointment for CT scan.

## 2019-03-25 NOTE — Telephone Encounter (Signed)
Patient was called and a voicemail was left informing patient to return phone call for lab results. 

## 2019-03-25 NOTE — Telephone Encounter (Signed)
-----  Message from Charlott Rakes, MD sent at 03/23/2019  5:30 PM EDT ----- She has an elevated d-dimer (usually elevated in the presence of blood clots) and so I have ordered a CT angiogram of the chest to evaluate for pulmonary embolism.  Could you please schedule this for her.

## 2019-03-26 LAB — PROTEIN C, TOTAL: Protein C Antigen: 87 % (ref 60–150)

## 2019-03-26 LAB — FACTOR V LEIDEN

## 2019-03-26 LAB — LUPUS ANTICOAGULANT PANEL
Dilute Viper Venom Time: 30.9 s (ref 0.0–47.0)
PTT Lupus Anticoagulant: 35 s (ref 0.0–51.9)

## 2019-03-26 LAB — D-DIMER, QUANTITATIVE: D-DIMER: 1.79 mg/L FEU — ABNORMAL HIGH (ref 0.00–0.49)

## 2019-03-26 LAB — PROTEIN S, TOTAL: Protein S Ag, Total: 119 % (ref 60–150)

## 2019-03-26 LAB — HOMOCYSTEINE: Homocysteine: 14.4 umol/L (ref 0.0–14.5)

## 2019-03-31 ENCOUNTER — Ambulatory Visit (HOSPITAL_COMMUNITY): Payer: Medicaid Other

## 2019-04-06 ENCOUNTER — Ambulatory Visit (HOSPITAL_COMMUNITY): Payer: Medicaid Other

## 2019-04-12 ENCOUNTER — Ambulatory Visit (HOSPITAL_COMMUNITY): Admission: RE | Admit: 2019-04-12 | Payer: Medicaid Other | Source: Ambulatory Visit

## 2019-04-18 ENCOUNTER — Ambulatory Visit: Payer: Medicaid Other | Attending: Nurse Practitioner | Admitting: Nurse Practitioner

## 2019-04-18 ENCOUNTER — Other Ambulatory Visit: Payer: Self-pay

## 2019-04-19 ENCOUNTER — Telehealth: Payer: Self-pay | Admitting: Family Medicine

## 2019-04-20 ENCOUNTER — Ambulatory Visit (HOSPITAL_COMMUNITY): Payer: Medicaid Other

## 2019-06-13 ENCOUNTER — Emergency Department (HOSPITAL_BASED_OUTPATIENT_CLINIC_OR_DEPARTMENT_OTHER): Payer: Worker's Compensation

## 2019-06-13 ENCOUNTER — Emergency Department (HOSPITAL_COMMUNITY): Payer: Worker's Compensation

## 2019-06-13 ENCOUNTER — Other Ambulatory Visit: Payer: Self-pay

## 2019-06-13 ENCOUNTER — Observation Stay (HOSPITAL_COMMUNITY)
Admission: EM | Admit: 2019-06-13 | Discharge: 2019-06-14 | Disposition: A | Discharge status: Home / Self Care | Payer: Worker's Compensation | Attending: Student in an Organized Health Care Education/Training Program | Admitting: Student in an Organized Health Care Education/Training Program

## 2019-06-13 ENCOUNTER — Encounter (HOSPITAL_COMMUNITY): Payer: Self-pay | Admitting: Emergency Medicine

## 2019-06-13 ENCOUNTER — Other Ambulatory Visit: Payer: Self-pay | Admitting: Orthopedic Surgery

## 2019-06-13 DIAGNOSIS — E559 Vitamin D deficiency, unspecified: Secondary | ICD-10-CM | POA: Diagnosis not present

## 2019-06-13 DIAGNOSIS — Z825 Family history of asthma and other chronic lower respiratory diseases: Secondary | ICD-10-CM | POA: Diagnosis not present

## 2019-06-13 DIAGNOSIS — R0902 Hypoxemia: Secondary | ICD-10-CM

## 2019-06-13 DIAGNOSIS — W01198A Fall on same level from slipping, tripping and stumbling with subsequent striking against other object, initial encounter: Secondary | ICD-10-CM

## 2019-06-13 DIAGNOSIS — S52551A Other extraarticular fracture of lower end of right radius, initial encounter for closed fracture: Secondary | ICD-10-CM | POA: Insufficient documentation

## 2019-06-13 DIAGNOSIS — Z9989 Dependence on other enabling machines and devices: Secondary | ICD-10-CM

## 2019-06-13 DIAGNOSIS — R590 Localized enlarged lymph nodes: Secondary | ICD-10-CM | POA: Diagnosis not present

## 2019-06-13 DIAGNOSIS — Y92511 Restaurant or cafe as the place of occurrence of the external cause: Secondary | ICD-10-CM

## 2019-06-13 DIAGNOSIS — S62101A Fracture of unspecified carpal bone, right wrist, initial encounter for closed fracture: Secondary | ICD-10-CM | POA: Diagnosis present

## 2019-06-13 DIAGNOSIS — M7989 Other specified soft tissue disorders: Secondary | ICD-10-CM | POA: Insufficient documentation

## 2019-06-13 DIAGNOSIS — Z20828 Contact with and (suspected) exposure to other viral communicable diseases: Secondary | ICD-10-CM | POA: Insufficient documentation

## 2019-06-13 DIAGNOSIS — R609 Edema, unspecified: Secondary | ICD-10-CM | POA: Insufficient documentation

## 2019-06-13 DIAGNOSIS — R52 Pain, unspecified: Secondary | ICD-10-CM

## 2019-06-13 DIAGNOSIS — I10 Essential (primary) hypertension: Secondary | ICD-10-CM | POA: Diagnosis not present

## 2019-06-13 DIAGNOSIS — Z6841 Body Mass Index (BMI) 40.0 and over, adult: Secondary | ICD-10-CM

## 2019-06-13 DIAGNOSIS — Z9119 Patient's noncompliance with other medical treatment and regimen: Secondary | ICD-10-CM

## 2019-06-13 DIAGNOSIS — S52611A Displaced fracture of right ulna styloid process, initial encounter for closed fracture: Secondary | ICD-10-CM | POA: Diagnosis not present

## 2019-06-13 DIAGNOSIS — E662 Morbid (severe) obesity with alveolar hypoventilation: Secondary | ICD-10-CM | POA: Insufficient documentation

## 2019-06-13 DIAGNOSIS — S5291XA Unspecified fracture of right forearm, initial encounter for closed fracture: Secondary | ICD-10-CM | POA: Diagnosis present

## 2019-06-13 DIAGNOSIS — W010XXA Fall on same level from slipping, tripping and stumbling without subsequent striking against object, initial encounter: Secondary | ICD-10-CM | POA: Insufficient documentation

## 2019-06-13 DIAGNOSIS — R05 Cough: Secondary | ICD-10-CM | POA: Insufficient documentation

## 2019-06-13 DIAGNOSIS — M546 Pain in thoracic spine: Secondary | ICD-10-CM | POA: Insufficient documentation

## 2019-06-13 DIAGNOSIS — Y990 Civilian activity done for income or pay: Secondary | ICD-10-CM | POA: Diagnosis not present

## 2019-06-13 DIAGNOSIS — I2723 Pulmonary hypertension due to lung diseases and hypoxia: Secondary | ICD-10-CM | POA: Diagnosis not present

## 2019-06-13 DIAGNOSIS — Z86711 Personal history of pulmonary embolism: Secondary | ICD-10-CM | POA: Diagnosis not present

## 2019-06-13 DIAGNOSIS — Z87891 Personal history of nicotine dependence: Secondary | ICD-10-CM

## 2019-06-13 DIAGNOSIS — E66813 Obesity, class 3: Secondary | ICD-10-CM | POA: Diagnosis present

## 2019-06-13 DIAGNOSIS — J9610 Chronic respiratory failure, unspecified whether with hypoxia or hypercapnia: Secondary | ICD-10-CM | POA: Diagnosis present

## 2019-06-13 DIAGNOSIS — T07XXXA Unspecified multiple injuries, initial encounter: Secondary | ICD-10-CM | POA: Diagnosis not present

## 2019-06-13 DIAGNOSIS — S52591A Other fractures of lower end of right radius, initial encounter for closed fracture: Secondary | ICD-10-CM | POA: Diagnosis not present

## 2019-06-13 DIAGNOSIS — Z791 Long term (current) use of non-steroidal anti-inflammatories (NSAID): Secondary | ICD-10-CM | POA: Insufficient documentation

## 2019-06-13 DIAGNOSIS — J9611 Chronic respiratory failure with hypoxia: Principal | ICD-10-CM | POA: Diagnosis present

## 2019-06-13 DIAGNOSIS — W19XXXA Unspecified fall, initial encounter: Secondary | ICD-10-CM | POA: Diagnosis not present

## 2019-06-13 DIAGNOSIS — Y9301 Activity, walking, marching and hiking: Secondary | ICD-10-CM | POA: Insufficient documentation

## 2019-06-13 DIAGNOSIS — M25531 Pain in right wrist: Secondary | ICD-10-CM | POA: Diagnosis not present

## 2019-06-13 DIAGNOSIS — Z79899 Other long term (current) drug therapy: Secondary | ICD-10-CM | POA: Insufficient documentation

## 2019-06-13 DIAGNOSIS — S52501A Unspecified fracture of the lower end of right radius, initial encounter for closed fracture: Secondary | ICD-10-CM | POA: Diagnosis not present

## 2019-06-13 DIAGNOSIS — Z86718 Personal history of other venous thrombosis and embolism: Secondary | ICD-10-CM | POA: Insufficient documentation

## 2019-06-13 DIAGNOSIS — G4733 Obstructive sleep apnea (adult) (pediatric): Secondary | ICD-10-CM

## 2019-06-13 DIAGNOSIS — R0600 Dyspnea, unspecified: Secondary | ICD-10-CM

## 2019-06-13 HISTORY — DX: Sleep apnea, unspecified: G47.30

## 2019-06-13 LAB — CBC WITH DIFFERENTIAL/PLATELET
Abs Immature Granulocytes: 0.02 10*3/uL (ref 0.00–0.07)
Basophils Absolute: 0.1 10*3/uL (ref 0.0–0.1)
Basophils Relative: 1 %
Eosinophils Absolute: 0.1 10*3/uL (ref 0.0–0.5)
Eosinophils Relative: 1 %
HCT: 42.2 % (ref 36.0–46.0)
Hemoglobin: 13 g/dL (ref 12.0–15.0)
Immature Granulocytes: 0 %
Lymphocytes Relative: 17 %
Lymphs Abs: 1 10*3/uL (ref 0.7–4.0)
MCH: 29.1 pg (ref 26.0–34.0)
MCHC: 30.8 g/dL (ref 30.0–36.0)
MCV: 94.4 fL (ref 80.0–100.0)
Monocytes Absolute: 0.5 10*3/uL (ref 0.1–1.0)
Monocytes Relative: 9 %
Neutro Abs: 4.2 10*3/uL (ref 1.7–7.7)
Neutrophils Relative %: 72 %
Platelets: 199 10*3/uL (ref 150–400)
RBC: 4.47 MIL/uL (ref 3.87–5.11)
RDW: 13.4 % (ref 11.5–15.5)
WBC: 5.9 10*3/uL (ref 4.0–10.5)
nRBC: 0 % (ref 0.0–0.2)

## 2019-06-13 LAB — POCT I-STAT EG7
Acid-Base Excess: 7 mmol/L — ABNORMAL HIGH (ref 0.0–2.0)
Bicarbonate: 32.4 mmol/L — ABNORMAL HIGH (ref 20.0–28.0)
Calcium, Ion: 1.07 mmol/L — ABNORMAL LOW (ref 1.15–1.40)
HCT: 40 % (ref 36.0–46.0)
Hemoglobin: 13.6 g/dL (ref 12.0–15.0)
O2 Saturation: 99 %
Potassium: 4.2 mmol/L (ref 3.5–5.1)
Sodium: 139 mmol/L (ref 135–145)
TCO2: 34 mmol/L — ABNORMAL HIGH (ref 22–32)
pCO2, Ven: 47.2 mmHg (ref 44.0–60.0)
pH, Ven: 7.444 — ABNORMAL HIGH (ref 7.250–7.430)
pO2, Ven: 158 mmHg — ABNORMAL HIGH (ref 32.0–45.0)

## 2019-06-13 LAB — COMPREHENSIVE METABOLIC PANEL
ALT: 10 U/L (ref 0–44)
AST: 17 U/L (ref 15–41)
Albumin: 3.4 g/dL — ABNORMAL LOW (ref 3.5–5.0)
Alkaline Phosphatase: 53 U/L (ref 38–126)
Anion gap: 6 (ref 5–15)
BUN: 10 mg/dL (ref 6–20)
CO2: 31 mmol/L (ref 22–32)
Calcium: 9.1 mg/dL (ref 8.9–10.3)
Chloride: 104 mmol/L (ref 98–111)
Creatinine, Ser: 0.77 mg/dL (ref 0.44–1.00)
GFR calc Af Amer: >60 mL/min (ref >60)
GFR calc non Af Amer: >60 mL/min (ref >60)
Glucose, Bld: 75 mg/dL (ref 70–99)
Potassium: 4.2 mmol/L (ref 3.5–5.1)
Sodium: 141 mmol/L (ref 135–145)
Total Bilirubin: 1 mg/dL (ref 0.3–1.2)
Total Protein: 7.3 g/dL (ref 6.5–8.1)

## 2019-06-13 LAB — BRAIN NATRIURETIC PEPTIDE: B Natriuretic Peptide: 24.1 pg/mL (ref 0.0–100.0)

## 2019-06-13 LAB — TROPONIN I (HIGH SENSITIVITY): Troponin I (High Sensitivity): 5 ng/L

## 2019-06-13 LAB — SARS CORONAVIRUS 2 BY RT PCR (HOSPITAL ORDER, PERFORMED IN ~~LOC~~ HOSPITAL LAB): SARS Coronavirus 2: NEGATIVE

## 2019-06-13 MED ORDER — POLYETHYLENE GLYCOL 3350 17 G PO PACK: 17.0000 g | PACK | Freq: Every day | ORAL | Status: DC | PRN

## 2019-06-13 MED ORDER — ALBUTEROL SULFATE (2.5 MG/3ML) 0.083% IN NEBU: 2.5000 mg | INHALATION_SOLUTION | RESPIRATORY_TRACT | Status: DC | PRN

## 2019-06-13 MED ORDER — ENOXAPARIN SODIUM 40 MG/0.4ML ~~LOC~~ SOLN
40.0000 mg | SUBCUTANEOUS | Status: DC
  Administered 2019-06-13: 40 mg via SUBCUTANEOUS
  Filled 2019-06-13: qty 0.4

## 2019-06-13 MED ORDER — ACETAMINOPHEN 650 MG RE SUPP: 650.0000 mg | Freq: Four times a day (QID) | RECTAL | Status: DC | PRN

## 2019-06-13 MED ORDER — IOHEXOL 350 MG/ML SOLN
100.0000 mL | Freq: Once | INTRAVENOUS | Status: AC | PRN
  Administered 2019-06-13: 100 mL via INTRAVENOUS

## 2019-06-13 MED ORDER — IBUPROFEN 200 MG PO TABS: 400.0000 mg | ORAL_TABLET | Freq: Four times a day (QID) | ORAL | Status: DC | PRN

## 2019-06-13 MED ORDER — FUROSEMIDE 10 MG/ML IJ SOLN
20.0000 mg | Freq: Once | INTRAMUSCULAR | Status: AC
  Administered 2019-06-13: 20 mg via INTRAVENOUS
  Filled 2019-06-13: qty 4

## 2019-06-13 MED ORDER — ACETAMINOPHEN 325 MG PO TABS
650.0000 mg | ORAL_TABLET | Freq: Four times a day (QID) | ORAL | Status: DC | PRN
  Administered 2019-06-14: 650 mg via ORAL
  Filled 2019-06-13: qty 2

## 2019-06-13 MED ORDER — ONDANSETRON HCL 4 MG/2ML IJ SOLN: 4.0000 mg | Freq: Four times a day (QID) | INTRAMUSCULAR | Status: DC | PRN

## 2019-06-13 MED ORDER — ONDANSETRON HCL 4 MG PO TABS: 4.0000 mg | ORAL_TABLET | Freq: Four times a day (QID) | ORAL | Status: DC | PRN

## 2019-06-13 NOTE — H&P (View-Only) (Signed)
Reason for Consult:Right wrist fx Referring Physician: J Knapp  Angel Brewer is an 53 y.o. female.  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 She got her feet wet in the rain and when she stepped back inside she slipped on the floor and fell. She put out her hand to break her fall and had immediate wrist pain. She came to the ED for evaluation where x-rays showed a wrist fx and hand surgery was consulted. She is RHD and works at [employer]. Incidentally she's been having dyspnea with ADL's for ~3 months, cause unknown.  Past Medical History:  Diagnosis Date  . Morbid obesity (HCC)     Past Surgical History:  Procedure Laterality Date  . CESAREAN SECTION      No family history on file.  Social History:  reports that she has quit smoking. She has never used smokeless tobacco. She reports that she does not drink alcohol or use drugs.  Allergies: No Known Allergies  Medications: I have reviewed the patient's current medications.  Results for orders placed or performed during the hospital encounter of 06/13/19 (from the past 48 hour(s))  CBC with Differential     Status: None   Collection Time: 06/13/19  9:00 AM  Result Value Ref Range   WBC 5.9 4.0 - 10.5 K/uL   RBC 4.47 3.87 - 5.11 MIL/uL   Hemoglobin 13.0 12.0 - 15.0 g/dL   HCT 42.2 36.0 - 46.0 %   MCV 94.4 80.0 - 100.0 fL   MCH 29.1 26.0 - 34.0 pg   MCHC 30.8 30.0 - 36.0 g/dL   RDW 13.4 11.5 - 15.5 %   Platelets 199 150 - 400 K/uL   nRBC 0.0 0.0 - 0.2 %   Neutrophils Relative % 72 %   Neutro Abs 4.2 1.7 - 7.7 K/uL   Lymphocytes Relative 17 %   Lymphs Abs 1.0 0.7 - 4.0 K/uL   Monocytes Relative 9 %   Monocytes Absolute 0.5 0.1 - 1.0 K/uL   Eosinophils Relative 1 %   Eosinophils Absolute 0.1 0.0 - 0.5 K/uL   Basophils Relative 1 %   Basophils Absolute 0.1 0.0 - 0.1 K/uL   Immature Granulocytes 0 %   Abs Immature Granulocytes 0.02 0.00 - 0.07 K/uL    Comment: Performed at [facility]  Lab, [address] [place], [place] [address]    Dg Wrist Complete Right  Result Date: 06/13/2019 CLINICAL DATA:  Fall.  Right wrist injury. EXAM: RIGHT WRIST - COMPLETE 3+ VIEW COMPARISON:  No prior. FINDINGS: Comminuted, displaced, angulated distal right radial fracture noted. Extension into the radiocarpal joint space noted. Tiny ulnar styloid tip fracture. No radiopaque foreign body. IMPRESSION: Comminuted, displaced, angulated distal right radial fracture. Extension to the radiocarpal joint space noted. Tiny ulnar styloid tip fracture. Electronically Signed   By: Thomas  Register   On: 06/13/2019 07:03   Dg Chest Portable 1 View  Result Date: 06/13/2019 CLINICAL DATA:  Hypoxia. Shortness of breath. EXAM: PORTABLE CHEST 1 VIEW COMPARISON:  Two-view chest x-ray 02/29/2016 FINDINGS: Heart is enlarged. Mild pulmonary vascular congestion is present. No significant airspace consolidation is present. Minimal bibasilar atelectasis is noted. IMPRESSION: 1. Stable cardiomegaly and mild pulmonary vascular congestion. 2. Minimal bibasilar atelectasis without focal airspace disease. Electronically Signed   By: Christopher  Mattern M.D.   On: 06/13/2019 09:39    Review of Systems  Constitutional: Negative for weight loss.  HENT: Negative for ear discharge, ear pain,   hearing loss and tinnitus.   Eyes: Negative for blurred vision, double vision, photophobia and pain.  Respiratory: Positive for shortness of breath. Negative for cough and sputum production.   Cardiovascular: Negative for chest pain.  Gastrointestinal: Negative for abdominal pain, nausea and vomiting.  Genitourinary: Negative for dysuria, flank pain, frequency and urgency.  Musculoskeletal: Positive for joint pain (Right wrist). Negative for back pain, falls, myalgias and neck pain.  Neurological: Negative for dizziness, tingling, sensory change, focal weakness, loss of consciousness and headaches.  Endo/Heme/Allergies: Does not bruise/bleed  easily.  Psychiatric/Behavioral: Negative for depression, memory loss and substance abuse. The patient is not nervous/anxious.    Blood pressure (!) 161/78, pulse 74, temperature 97.9 F (36.6 C), temperature source Oral, resp. rate 16, weight (!) 181.9 kg, last menstrual period 11/29/2015, SpO2 99 %. Physical Exam  Constitutional: She appears well-developed and well-nourished. No distress.  HENT:  Head: Normocephalic and atraumatic.  Eyes: Conjunctivae are normal. Right eye exhibits no discharge. Left eye exhibits no discharge. No scleral icterus.  Neck: Normal range of motion.  Cardiovascular: Normal rate and regular rhythm.  Respiratory: Effort normal. No respiratory distress.  Musculoskeletal:     Comments: Right shoulder, elbow, wrist, digits- no skin wounds, wrist severe TTP, mod edema  Sens  Ax/R/M/U intact  Mot   Ax/ R/ PIN/ M/ AIN/ U intact  Rad 2+  Neurological: She is alert.  Skin: Skin is warm and dry. She is not diaphoretic.  Psychiatric: She has a normal mood and affect. Her behavior is normal.    Assessment/Plan: Right wrist fx -- Will benefit from ORIF. Sugar tong splint, NWB for now.  Planning ORIF Monday 06-20-19 as outpatient here at [facility] (2/2 BMI restrictions for [facility]) Dyspnea/hypoxia -- Will need thorough workup before anesthesia will allow surgery. Recommend doing this as inpatient as she has been unsuccessful getting it worked up as OP. OSA Morbid obesity    Michael J. Jeffery, PA-C Orthopedic Surgery [phone] 06/13/2019, 10:10 AM   S/p Mechanical fall at work today at [employer].  Has comminuted intra-articular dorsally tilted R DRFx that will benefit from ORIF.  NVI, with good digital motion.  D/w patient this plan.  Can be d/c anytime per hand surgery with plan for outpatient surgery on Monday, likely with regional block/MAC.  Dave Tacuma Graffam, MD Hand Surgery [facility] [phone] 

## 2019-06-13 NOTE — H&P (Signed)
Date: 06/13/2019               Patient Name:  Angel Brewer MRN: [id number]  DOB: 05/03/1966 Age / Sex: 53 y.o., female   PCP: [name], NP              Medical Service: Internal Medicine Teaching Service              Attending Physician: Dr. Evette Doffing, Mallie Mussel, *    First Contact: Laverna Peace, MS3 Pager: [phone]  Second Contact: Dr. Ladona Horns Pager: [phone]  Third Contact Dr. Janace Aris Pager: [phone]       After Hours (After 5p/  First Contact Pager: [phone]  weekends / holidays): Second Contact Pager: [phone]   Chief Complaint: fall   History of Present Illness: Ms. Angel Brewer is a 53 yo female with a PMHx of PE, OSA, essential hypertension, vitamin D deficiency and morbid obesity who is R hand dominant and presents after a fall on an outstretched hand. 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 Pt endorses having immediate wrist pain, as well as swelling and decreased range of motion. Denies head trauma, loss of consciousness, or headache. In the ED, her wrist was splinted upon arrival. X-rays were obtained which showed a distal right radial fracture with extension into the radiocarpal joint space and tiny ulnar styloid tip fracture.. Hand surgery and ortho were consulted.   After being splinted and with the nurse tech attempting to help the pt ambulate, pt's O2 stats dropped to 86% and hovered at 86-91%. Nurse tech also reported pt had SOB and wheezing at this time. Pt reports having shortness of breath for about 3-4 weeks now. She experienced some dyspnea in September with chest pain that felt similar to an episode when she had a PE 3 years ago, which prompted her to see her PCP. She says she has not had chest pain since then, but does have dyspnea when she exerts herself at work, gets herself in the car, or moves around a lot. Pt reports the dyspnea improves with rest. Pt has albuterol  inhaler but does not use it, even during episodes of shortness of breath. Pt also endorses orthopnea, occasional dry cough, and swelling and warmth in her L leg one night recently because "my leg was off the bed while I was sleeping." Pt denies fever, headache, vision changes, sore throat, chest pain, palpitations, nausea, vomiting, diarrhea, constipation, abdominal pain, joint pain, numbness or tingling in her arms or legs.  Pt reports having sleep apnea and endorses using a CPAP machine every night. Pt reports she wears it, but upon waking up she is no longer wearing it. When she checks her machine log, it "shows a frowny face because the mask didn't stay on my face long enough." Pt is unsure whether her machine is set to be a CPAP or BIPAP machine. Pt previously seeing a bariatric surgeon in order to pursue weight loss surgery. However, she no longer wants to do the surgery and instead wants to wait until things surrounding the pandemic calm down so that she does not risk getting sick.  In the ED, BP was 176/107 shortly after arrival and downtrended to 124/89, pulse was 65, respiration rate 18 with O2 saturation of 95 at rest, and decreasing to 86-91% while walking to the bathroom. Lab work with CBC and CMP all unremarkable except for an albumin of 3.4. BNP  24. Trop 5. COVID test negative. EKG without ST segment changes. CT chest angiography was negative for emboli. U/S of bilateral lower extremities did not detect thrombus, but was limited due to body habitus.  Meds:   No current facility-administered medications on file prior to encounter.    Current Outpatient Medications on File Prior to Encounter  Medication Sig Dispense Refill  . albuterol (VENTOLIN HFA) 108 (90 Base) MCG/ACT inhaler Inhale 2 puffs into the lungs every 6 (six) hours as needed for wheezing or shortness of breath. 18 g 0  . ibuprofen (ADVIL) 200 MG tablet Take 600 mg by mouth every 6 (six) hours as needed (leg pain).    .  benzonatate (TESSALON) 100 MG capsule Take 1 capsule (100 mg total) by mouth 2 (two) times daily as needed for cough. (Patient not taking: Reported on 06/13/2019) 20 capsule 0   Allergies: Allergies as of 06/13/2019  . (No Known Allergies)   Past Medical History:  Diagnosis Date  . Morbid obesity ([place])   . PE (pulmonary embolism) 02/29/2016  . Sleep apnea     Family History: Mother - asthma, COPD Sister - asthma Niece - asthma Denies family history of HTN, DM, hyperlipidemia, heart disease, or cancers.  Social History: Pt lives in a house in [place]. Pt lives alone. Pt denies any pets, new plants, using new cleaners or new changes in her diet. Pt reports being active in the past, walking about 6 days a week, but no longer walking since she started work because she is too tired after standing all day. Pt works at [employer]. Pt reports quitting smoking over 20 years ago. She would spoke 1 pack per week. She denies vaping, alcohol or other drug use.  Review of Systems: A complete ROS was negative except as per HPI.  Physical Exam: Blood pressure 124/89, pulse 65, temperature 97.9 F (36.6 C), temperature source Oral, resp. rate 18, weight (!) 181.9 kg, last menstrual period 11/29/2015, SpO2 95 %.  Constitutional: well-appearing, obese female in no acute distress laying comfortably on hospital bed. Head: Normocephalic, atraumatic Eyes: keloid over superior, medial L eyelid. Lungs: No increased work of breathing, decreased breath sounds, CTAB, no wheezes or ronchi Cardiovascular: RRR, normal S1, S2 no m/r/g.  Abdominal: normal bowel sounds Skin: warm, dry, and well perfused Extremities: R wrist wrapped and R arm in sling, cap refill <2 seconds in both hands, +2 pitting edema in bilateral lower extremities Neuro: no focal deficits appreciated, pt moving all four extremities spontaneously Psych: Pt reports mood is "fine"  EKG: personally reviewed my interpretation is normal sinus  rhythm, no ST segment changes  CXR: personally reviewed my interpretation is cardiomegaly with clear lung fields and no sign of effusion  CT angio: IMPRESSION: 1. Negative for pulmonary embolism to the lobar branch levels. 2. Mosaic attenuation of lungs may represent air trapping versus small vessel or small airways disease. 3. Mildly enlarged right hilar and right paratracheal lymph node, nonspecific. Similar size and appearance compared to prior CT 02/29/2016.  Vascular U/S: No evidence of deep vein thrombosis in right or left lower extremities, though portions of the examination were limited.  Assessment & Plan by Problem:  Active Problems:  Dyspnea on Exertion  Wrist fracture  Hx of hypertension  Ms. Savoca is a 53 yo female with a PMHx of PE, OSA, essential hypertension, and morbid obesity who presented after a fall resulting in a R distal radius fracture. During her evaluation, she was found to be hypoxic  with associated dyspnea on exertion.   Pt's dyspnea on exertion has been a chronic problem for at least 4 weeks with dry cough and has limited pt's ability to tolerate exercise. Pt has OSA that is poorly controlled due to improper CPAP use. Pt also has prescribed inhaler that goes unused during these episodes of dyspnea. Physical exam significant for decreased breath sounds and lower extremity edema. Pt admitted for further work-up to determine underlying etiology of her transient hypoxia.    Dyspnea on exertion Pt presents with hypoxia, dyspnea on exertion, decreased breath sounds, and hypertension in the setting of R wrist injury s/p fall. DVT/PE is least likely based on negative LE dopplers and CTA imaging. Pt had no fever, leukocytosis, or consolidation on CXR that could suggest pneumonia. Asthma/COPD less likely without history of prior episodes or smoking and no wheezing or rhonchi on pulmonary exam. Prior echo from 2017 with LVEF of 50-55% and concentric hypertrophy, so heart  failure could be considered especially with cardiomegaly on imaging and LE edema, but less likely an acute exacerbation due to a BNP wnl.    Expect pt most likely suffering from deconditioning and hypoventilation 2/2 to severe CPAP and potential OHS. Sleep study in 2018 remarkable for severe OSA with optimal BiPAP pressure of 19/15, as CPAP gave inadequate control and pt was noted to have a severe O2 desaturation at that time to 62%. Pt's bicarb on admission within the normal range at 31.  - continuous pulse ox, goal O2 sat >92% - order VBG to evaluate for OHS exacerbation - order echo to evaluate for heart failure - continuous cardiac monitoring - will order CPAP for OSA - pt may benefit from PFTs outpt   Wrist fracture Pt presented to ED with R distal radial fracture s/p FOOSH. X-ray remarkable for distal radial fracture with extension into the radiocarpal joint as well as an ulnar styloid fracture. - ortho on board, placed sugar tong splinting, recs greatly appreciated - zofran 4 mg prn for nausea - advil 400 mg prn and tylenol 650 mg prn for pain, well-controlled - plan for ORIF outpt with Dr. Grandville Silos or inpt if medically cleared   Hx of hypertension: Pt reports prior use of HCTZ, but reports no longer needing it after talking with her doctor. Pressures have ranged from 595-638 systolic and 75-643 diastolic - Will continue to monitor   Diet: Regular Fluids: none VTE ppx: Lovenox 40 mg q24h Code Status: FULL CODE   Dispo: Admit patient to Observation with expected length of stay less than 2 midnights.  Signed: Carin Primrose, Medical Student 06/13/2019, 4:10 PM    Attestation for Student Documentation:  I personally was present and performed or re-performed the history, physical exam and medical decision-making activities of this service and have verified that the service and findings are accurately documented in the student's note.  Ladona Horns, MD 06/13/2019, 7:08  PM

## 2019-06-13 NOTE — ED Notes (Signed)
Respiratory at bedside

## 2019-06-13 NOTE — ED Notes (Signed)
Ortho MD at [facility]

## 2019-06-13 NOTE — Progress Notes (Signed)
RT set up CPAP and placed on patient. Patient tolerating well at this time. No respiratory distress noted. RT will monitor as needed.

## 2019-06-13 NOTE — Progress Notes (Signed)
Orthopedic Tech Progress Note Patient Details:  Angel Brewer 1965/08/29 [id number] Applied sugartong to patient.. patient is somewhat heavy and applied splint as best as possible  Ortho Devices Type of Ortho Device: Sugartong splint, Arm sling Ortho Device/Splint Location: RUE Ortho Device/Splint Interventions: Application, Ordered   Post Interventions Patient Tolerated: Well Instructions Provided: Care of device, Adjustment of device   [name] 06/13/2019, 11:50 AM

## 2019-06-13 NOTE — ED Notes (Signed)
Patient transported to X-ray 

## 2019-06-13 NOTE — ED Provider Notes (Signed)
[facility] DEPART[facility] Provider Note   CSN: [id number] Arrival date & time: 06/13/19  7510     History   Chief Complaint Chief Complaint  Patient presents with   Extremity Pain   R wrist injury    HPI Angel Brewer is a 53 y.o. female with history of morbid obesity, bilateral PE s/p anticoagulation, remote tobacco use presents to the ER for evaluation of injury to the right wrist sustained after a fall while at work today.  Patient works at [employer] and was taking food out for curbside pickup when she slipped on the wet ground and fell landing backwards.  She braced her fall on an outstretched right hand.  Reports sudden, severe pain in the right wrist and think she has broken it.  Also scraped her left elbow.  Has associated swelling and decreased range of motion in the right wrist as well as some mild thoracic back pain.  Denies any other injury including head injury, loss of consciousness, headache.  She is right-hand dominant.  Wrist was splinted upon arrival to the ER.  Denies previous history of injuries or surgeries in the area.  During encounter as patient was undressing and getting into bed SPO2 was noted to drop to 84-87% with good Plath.  Patient was asked about shortness of breath.  States for the last 3 to 4 months has noticed increased work of breathing, "breathlessness".  This was worse with laying down flat initially but progressing to exertional shortness of breath.  She is not short of breath when she sitting down or at rest.  Initially she felt like every time she would sit back on a recliner or lay down to sleep or watch TV her breathing would suddenly "stop" and she would start coughing.  She had orthopnea.  She then progressed into having exertional shortness of breath.  Getting into car, in out of bed causes shortness of breath that resolves after 30-60 seconds.  She went to PCP in September 2020 who obtained some lab work, was told  everything was normal and had an order for CTA of her chest but states her insurance has not been able to approve it and has not been able to do it.  She had a sleep study and was told she had sleep apnea, now uses a CPAP and feels like her breathing is better during sleep.  Denies any recent acute changes to her shortness of breath.  Her SPO2 dropped again when she sat up in bed as low as 84% with a good Plath, after 15 to 20 seconds SPO2 was greater than 92% and patient had improvement in her work of breathing.  Denies any associated fever, cough, congestion, sore throat.  No associated chest pain.  Has noticed her right leg has been intermittently swollen for the last 3 to 4 months as well but states this is because her right leg hangs off the side of the bed when she sleeping.  No calf pain.     HPI  Past Medical History:  Diagnosis Date   Morbid obesity [facility])     Patient Active Problem List   Diagnosis Date Noted   Essential hypertension 08/25/2017   Morbid obesity ([place]) 10/29/2016   Vitamin D deficiency 10/29/2016   Bilateral pulmonary embolism ([place]) 02/29/2016   PE (pulmonary embolism) 02/29/2016    Past Surgical History:  Procedure Laterality Date   CESAREAN SECTION       OB History   No obstetric  history on file.      Home Medications    Prior to Admission medications   Medication Sig Start Date End Date Taking? Authorizing Provider  acetaminophen (TYLENOL) 500 MG tablet Take 2 tablets (1,000 mg total) by mouth every 6 (six) hours as needed. Patient not taking: Reported on 08/25/2017 02/16/17   Alfonse Spruce, FNP  albuterol (VENTOLIN HFA) 108 (90 Base) MCG/ACT inhaler Inhale 2 puffs into the lungs every 6 (six) hours as needed for wheezing or shortness of breath. 03/22/19   Charlott Rakes, MD  benzonatate (TESSALON) 100 MG capsule Take 1 capsule (100 mg total) by mouth 2 (two) times daily as needed for cough. 03/22/19   Charlott Rakes, MD  hydrochlorothiazide  (HYDRODIURIL) 25 MG tablet Take 1 tablet (25 mg total) by mouth daily. Patient not taking: Reported on 08/25/2017 02/16/17   Alfonse Spruce, FNP  pravastatin (PRAVACHOL) 20 MG tablet Take 1 tablet (20 mg total) by mouth daily. Patient not taking: Reported on 08/25/2017 11/03/16   Maren Reamer, MD  Vitamin D, Ergocalciferol, (DRISDOL) 50000 units CAPS capsule TAKE ONE CAPSULE BY MOUTH ONCE WEEKLY FOR  12 WEEKS. Patient not taking: Reported on 08/25/2017 02/25/17   Alfonse Spruce, FNP    Family History No family history on file.  Social History Social History   Tobacco Use   Smoking status: Former Smoker   Smokeless tobacco: Never Used  Substance Use Topics   Alcohol use: No   Drug use: No     Allergies   Patient has no known allergies.   Review of Systems Review of Systems  Respiratory: Positive for shortness of breath.   Cardiovascular: Positive for leg swelling (RLE).  Musculoskeletal: Positive for arthralgias and joint swelling.  All other systems reviewed and are negative.    Physical Exam Updated Vital Signs BP 124/89    Pulse 65    Temp 97.9 F (36.6 C) (Oral)    Resp 18    Wt (!) 181.9 kg    LMP 11/29/2015    SpO2 95%    BMI 60.97 kg/m   Physical Exam Vitals signs and nursing note reviewed.  Constitutional:      General: She is not in acute distress.    Appearance: She is well-developed.     Comments: NAD.  Pleasant. Morbidly obese   HENT:     Head: Normocephalic and atraumatic.     Right Ear: External ear normal.     Left Ear: External ear normal.     Nose: Nose normal.  Eyes:     General: No scleral icterus.    Conjunctiva/sclera: Conjunctivae normal.  Neck:     Musculoskeletal: Normal range of motion and neck supple.  Cardiovascular:     Rate and Rhythm: Normal rate and regular rhythm.     Heart sounds: Normal heart sounds. No murmur.     Comments: 1+ radial and DP pulses bilaterally. 1+ pitting edema to distal RLE and pretibial  area. No calf tenderness.  Pulmonary:     Effort: Respiratory distress present.     Comments: Spo2 drops to 84-87% with exertion sitting up, standing on side of bed.  Patient noted to be speaking in 2-3 word sentences, slightly increased work of breathing.  Normalizes to >92% at rest after 15-20 seconds.  Normal WOB and conversation while at rest. Diminished air sounds to lower lobes, difficult exam due to body habitus. No crackles, wheezing Musculoskeletal: Normal range of motion.  General: Tenderness and deformity present.     Comments: Deformity to right wrist, diffuse edema, tenderness dorsally over distal radius/ulnar, scaphoid, trapezius, trapezoid, capitate.  Negative scaphoid compression test.  Full ROM of thumb but with pain.  Non tender metatarsal, MTPs, digits.  Middle/proximal forearm non tender, soft compartments   Skin:    General: Skin is warm and dry.     Capillary Refill: Capillary refill takes less than 2 seconds.  Neurological:     Mental Status: She is alert and oriented to person, place, and time.     Comments: Sensation to light touch in radial, medial, ulnar nerve distribution intact.  Patient able to wiggle fingers.  Hand grip diminished due to pain.   Psychiatric:        Behavior: Behavior normal.        Thought Content: Thought content normal.        Judgment: Judgment normal.      ED Treatments / Results  Labs (all labs ordered are listed, but only abnormal results are displayed) Labs Reviewed  COMPREHENSIVE METABOLIC PANEL - Abnormal; Notable for the following components:      Result Value   Albumin 3.4 (*)    All other components within normal limits  SARS CORONAVIRUS 2 BY RT PCR (HOSPITAL ORDER, [facility])  CBC WITH DIFFERENTIAL/PLATELET  BRAIN NATRIURETIC PEPTIDE  BRAIN NATRIURETIC PEPTIDE  TROPONIN I (HIGH SENSITIVITY)    EKG EKG Interpretation  Date/Time:  Monday June 13 2019 09:38:20 EST Ventricular Rate:    61 PR Interval:    QRS Duration: 95 QT Interval:  440 QTC Calculation: 444 R Axis:   81 Text Interpretation: Sinus rhythm Since last tracing rate slower Confirmed by Dorie Rank [id number]) on 06/13/2019 9:53:00 AM   Radiology Dg Thoracic Spine 2 View  Result Date: 06/13/2019 CLINICAL DATA:  Fall EXAM: THORACIC SPINE 2 VIEWS COMPARISON:  None. FINDINGS: There is no evidence of acute fracture of the visualized thoracic spine. Vertebral body heights and alignment appear maintained. Minor disc space narrowing. IMPRESSION: No acute fracture identified. Electronically Signed   By: Macy Mis M.D.   On: 06/13/2019 11:14   Dg Lumbar Spine 2-3 Views  Result Date: 06/13/2019 CLINICAL DATA:  Fall at work.  Difficulty breathing. EXAM: LUMBAR SPINE - 2-3 VIEW COMPARISON:  None. FINDINGS: There is no evidence of lumbar spine fracture. Alignment is normal. Intervertebral disc spaces are maintained. IMPRESSION: No acute abnormality. Electronically Signed   By: San Morelle M.D.   On: 06/13/2019 11:16   Dg Wrist Complete Right  Result Date: 06/13/2019 CLINICAL DATA:  Fall.  Right wrist injury. EXAM: RIGHT WRIST - COMPLETE 3+ VIEW COMPARISON:  No prior. FINDINGS: Comminuted, displaced, angulated distal right radial fracture noted. Extension into the radiocarpal joint space noted. Tiny ulnar styloid tip fracture. No radiopaque foreign body. IMPRESSION: Comminuted, displaced, angulated distal right radial fracture. Extension to the radiocarpal joint space noted. Tiny ulnar styloid tip fracture. Electronically Signed   By: [place]   On: 06/13/2019 07:03   Ct Angio Chest Pe W And/or Wo Contrast  Result Date: 06/13/2019 CLINICAL DATA:  Shortness of breath, history of pulmonary embolism. EXAM: CT ANGIOGRAPHY CHEST WITH CONTRAST TECHNIQUE: Multidetector CT imaging of the chest was performed using the standard protocol during bolus administration of intravenous contrast. Multiplanar CT image  reconstructions and MIPs were obtained to evaluate the vascular anatomy. CONTRAST:  134m OMNIPAQUE IOHEXOL 350 MG/ML SOLN COMPARISON:  02/29/2016 FINDINGS: Cardiovascular: Satisfactory  opacification of the central pulmonary arteries. Beam hardening artifact from patient body habitus and slight respiratory motion degrades evaluation of segmental and subsegmental branch pulmonary arteries. No filling defects within the central, main, or lobar branch pulmonary arteries to suggest pulmonary embolism. Normal RV to LV ratio. Heart size within normal limits. No pericardial effusion. Thoracic aorta is nonaneurysmal. Mediastinum/Nodes: Persistently enlarged right paratracheal lymph node measuring 16 mm short axis (series 5, image 38), similar to prior. Mildly prominent right hilar lymph node measures 16 mm short axis (series 5, image 57), similar to prior. No axillary lymphadenopathy. Thyroid, trachea, and esophagus within normal limits. Lungs/Pleura: Mosaic attenuation of the bilateral lung fields. No focal airspace consolidation. No pleural effusion or pneumothorax. Minimal linear scarring versus atelectasis in the left greater than right lung bases. Upper Abdomen: No acute abnormality. Musculoskeletal: No chest wall abnormality. No acute or significant osseous findings. Review of the MIP images confirms the above findings. IMPRESSION: 1. Negative for pulmonary embolism to the lobar branch levels. 2. Mosaic attenuation of lungs may represent air trapping versus small vessel or small airways disease. 3. Mildly enlarged right hilar and right paratracheal lymph node, nonspecific. Similar size and appearance compared to prior CT 02/29/2016. Electronically Signed   By: Davina Poke M.D.   On: 06/13/2019 11:38   Dg Chest Portable 1 View  Result Date: 06/13/2019 CLINICAL DATA:  Hypoxia. Shortness of breath. EXAM: PORTABLE CHEST 1 VIEW COMPARISON:  Two-view chest x-ray 02/29/2016 FINDINGS: Heart is enlarged. Mild  pulmonary vascular congestion is present. No significant airspace consolidation is present. Minimal bibasilar atelectasis is noted. IMPRESSION: 1. Stable cardiomegaly and mild pulmonary vascular congestion. 2. Minimal bibasilar atelectasis without focal airspace disease. Electronically Signed   By: San Morelle M.D.   On: 06/13/2019 09:39   Vas [place] Lower Extremity Venous (dvt) (only [facility])  Result Date: 06/13/2019  Lower Venous Study Indications: Swelling, and Edema.  Limitations: Body habitus and poor ultrasound/tissue interface. Comparison Study: no prior Performing Technologist: Abram Sander RVS  Examination Guidelines: A complete evaluation includes B-mode imaging, spectral Doppler, color Doppler, and power Doppler as needed of all accessible portions of each vessel. Bilateral testing is considered an integral part of a complete examination. Limited examinations for reoccurring indications may be performed as noted.  +---------+---------------+---------+-----------+----------+--------------+  RIGHT     Compressibility Phasicity Spontaneity Properties Thrombus Aging  +---------+---------------+---------+-----------+----------+--------------+  CFV       Full            Yes       Yes                                    +---------+---------------+---------+-----------+----------+--------------+  SFJ       Full                                                             +---------+---------------+---------+-----------+----------+--------------+  FV Prox   Full                                                             +---------+---------------+---------+-----------+----------+--------------+  FV Mid                                                     Not visualized  +---------+---------------+---------+-----------+----------+--------------+  FV Distal                                                  Not visualized  +---------+---------------+---------+-----------+----------+--------------+  PFV        Full                                                             +---------+---------------+---------+-----------+----------+--------------+  POP       Full            Yes       Yes                                    +---------+---------------+---------+-----------+----------+--------------+  PTV       Full                                                             +---------+---------------+---------+-----------+----------+--------------+  PERO      Full                                                             +---------+---------------+---------+-----------+----------+--------------+   +---------+---------------+---------+-----------+----------+--------------+  LEFT      Compressibility Phasicity Spontaneity Properties Thrombus Aging  +---------+---------------+---------+-----------+----------+--------------+  CFV       Full            Yes       Yes                                    +---------+---------------+---------+-----------+----------+--------------+  SFJ       Full                                                             +---------+---------------+---------+-----------+----------+--------------+  FV Prox   Full                                                             +---------+---------------+---------+-----------+----------+--------------+  FV Mid                                                     Not visualized  +---------+---------------+---------+-----------+----------+--------------+  FV Distal Full                                                             +---------+---------------+---------+-----------+----------+--------------+  PFV       Full                                                             +---------+---------------+---------+-----------+----------+--------------+  POP       Full            Yes       Yes                                    +---------+---------------+---------+-----------+----------+--------------+  PTV       Full                                                              +---------+---------------+---------+-----------+----------+--------------+  PERO                                                       Not visualized  +---------+---------------+---------+-----------+----------+--------------+     Summary: Right: There is no evidence of deep vein thrombosis in the lower extremity. However, portions of this examination were limited- see technologist comments above. No cystic structure found in the popliteal fossa. Left: There is no evidence of deep vein thrombosis in the lower extremity. However, portions of this examination were limited- see technologist comments above. No cystic structure found in the popliteal fossa.  *See table(s) above for measurements and observations.    Preliminary     Procedures .Critical Care Performed by: Kinnie Feil, PA-C Authorized by: Kinnie Feil, PA-C   Critical care provider statement:    Critical care time (minutes):  45   Critical care was necessary to treat or prevent imminent or life-threatening deterioration of the following conditions:  Respiratory failure   Critical care was time spent personally by me on the following activities:  Discussions with consultants, evaluation of patient's response to treatment, examination of patient, ordering and performing treatments and interventions, ordering and review of laboratory studies, ordering and review of radiographic studies, pulse oximetry, re-evaluation of patient's condition, obtaining history from patient or surrogate, review of old charts and development of treatment plan with patient or surrogate   I assumed direction of  critical care for this patient from another provider in my specialty: no     (including critical care time)  Medications Ordered in ED Medications  iohexol (OMNIPAQUE) 350 MG/ML injection 100 mL (100 mLs Intravenous Contrast Given 06/13/19 1123)     Initial Impression / Assessment and Plan / ED Course  I have reviewed the  triage vital signs and the nursing notes.  Pertinent labs & imaging results that were available during my care of the patient were reviewed by me and considered in my medical decision making (see chart for details).  Clinical Course as of Jun 12 1350  Mon Jun 13, 2019  0945 Comminuted, displaced, angulated distal right radial fracture. Extension to the radiocarpal joint space noted. Tiny ulnar styloid tip fracture.  DG Wrist Complete Right [CG]  0945 Spoke to Hilbert Odor with hand/ortho.  Agrees with sugar tong splint.  Anticipates patient will be able to f/u in clinic with OP surgery in next 2 weeks. Will need medical clearance and addressing hypoxia prior to surgery. Ortho/hand can be consulted if admitted for further work up   [CG]  1000 Spoke to ortho hand PA states Dr Grandville Silos attempting to do surgery today under local anesthesia, pending anesthesia discussion. Requesting cephid covid test    [CG]  1005 Requested Charge RN to order cepheid 1 hr COVID test, RN April aware to collect    [CG]  1028 Per ortho PA anesthesia recommending full work up of hypoxia prior to surgery, surgery could be done during admission or as OP    [CG]  1202 1. Negative for pulmonary embolism to the lobar branch levels. 2. Mosaic attenuation of lungs may represent air trapping versus small vessel or small airways disease. 3. Mildly enlarged right hilar and right paratracheal lymph node, nonspecific. Similar size and appearance compared to prior CT 02/29/2016.  CT Angio Chest PE W and/or Wo Contrast [CG]    Clinical Course User Index [CG] Kinnie Feil, PA-C   EMR reviewed.  Patient seen by PCP 03/2019.  Her d-dimer was 1.79, unable to f/u with CTA to r/o PE.   1040: x-ray obtained in triage confirms radial fracture with extension into radiocarpal joint, ulnar styloid fracture.  Extremity NVI. Compartments soft.  Spoke to hand surgery, as above.  Patient will need surgery inpatient vs OP pending  medical clearance for exertional hypoxia.   She reports mild thoracic back pain, pending x-rays.  No other traumatic injuries reported or noted on exam.   Patient noted to be hypoxic 84% with minimal exertion.  This is in setting of gradual, worsening, exertional SOB, orthopnea.  H/o bilateral PE, but no CP.  Recent RLE edema, no calf tenderness.  Recent work up by PCP revealed elevated d-dimer but unable to f/u with OP CTAPE.   Given marked hypoxia with minimal exertion, chronic symptoms that are worsening, unreliable follow up will start hypoxia/SOB work up.    Highest on ddx is obesity hypoventilation syndrome vs pHTN vs PE/DVT.  Doubt infectious source given chronicity, no cough. She had a COVID test that was negative.  HF is lower on differential.  Brief remote h/o tobacco use, no known h/o COPD/asthma.   1345: ER work up including troponin, BNP, PE, COVID negative.  Trop x 1 obtained but given no CP report, chronicity doubt ACS and delta trop not thought to be indicated here.  Pt ambulated 25 ft to bathroom with hypoxia 86% and notable increased work of breathing, wheezing.  Patient admitted to IM teaching service for ongoing work up of dyspnea.   Final Clinical Impressions(s) / ED Diagnoses   Final diagnoses:  Hypoxia  Closed fracture of right forearm, initial encounter    ED Discharge Orders    None       Kinnie Feil, PA-C 06/13/19 1351    Dorie Rank, MD 06/14/19 [phone]

## 2019-06-13 NOTE — ED Notes (Addendum)
Pt transported to xray 

## 2019-06-13 NOTE — ED Notes (Signed)
Ordered diet tray 

## 2019-06-13 NOTE — Consult Note (Addendum)
Reason for Consult:Right wrist fx Referring Physician: Korrine Sicard Brewer is an 53 y.o. female.  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 She got her feet wet in the rain and when she stepped back inside she slipped on the floor and fell. She put out her hand to break her fall and had immediate wrist pain. She came to the ED for evaluation where x-rays showed a wrist fx and hand surgery was consulted. She is RHD and works at [employer]. Incidentally she's been having dyspnea with ADL's for ~3 months, cause unknown.  Past Medical History:  Diagnosis Date  . Morbid obesity ([place])     Past Surgical History:  Procedure Laterality Date  . CESAREAN SECTION      No family history on file.  Social History:  reports that she has quit smoking. She has never used smokeless tobacco. She reports that she does not drink alcohol or use drugs.  Allergies: No Known Allergies  Medications: I have reviewed the patient's current medications.  Results for orders placed or performed during the hospital encounter of 06/13/19 (from the past 48 hour(s))  CBC with Differential     Status: None   Collection Time: 06/13/19  9:00 AM  Result Value Ref Range   WBC 5.9 4.0 - 10.5 K/uL   RBC 4.47 3.87 - 5.11 MIL/uL   Hemoglobin 13.0 12.0 - 15.0 g/dL   HCT 42.2 36.0 - 46.0 %   MCV 94.4 80.0 - 100.0 fL   MCH 29.1 26.0 - 34.0 pg   MCHC 30.8 30.0 - 36.0 g/dL   RDW 13.4 11.5 - 15.5 %   Platelets 199 150 - 400 K/uL   nRBC 0.0 0.0 - 0.2 %   Neutrophils Relative % 72 %   Neutro Abs 4.2 1.7 - 7.7 K/uL   Lymphocytes Relative 17 %   Lymphs Abs 1.0 0.7 - 4.0 K/uL   Monocytes Relative 9 %   Monocytes Absolute 0.5 0.1 - 1.0 K/uL   Eosinophils Relative 1 %   Eosinophils Absolute 0.1 0.0 - 0.5 K/uL   Basophils Relative 1 %   Basophils Absolute 0.1 0.0 - 0.1 K/uL   Immature Granulocytes 0 %   Abs Immature Granulocytes 0.02 0.00 - 0.07 K/uL    Comment: Performed at [facility], [address], [place], [place] [address]    Dg Wrist Complete Right  Result Date: 06/13/2019 CLINICAL DATA:  Fall.  Right wrist injury. EXAM: RIGHT WRIST - COMPLETE 3+ VIEW COMPARISON:  No prior. FINDINGS: Comminuted, displaced, angulated distal right radial fracture noted. Extension into the radiocarpal joint space noted. Tiny ulnar styloid tip fracture. No radiopaque foreign body. IMPRESSION: Comminuted, displaced, angulated distal right radial fracture. Extension to the radiocarpal joint space noted. Tiny ulnar styloid tip fracture. Electronically Signed   By: Marcello Moores  Register   On: 06/13/2019 07:03   Dg Chest Portable 1 View  Result Date: 06/13/2019 CLINICAL DATA:  Hypoxia. Shortness of breath. EXAM: PORTABLE CHEST 1 VIEW COMPARISON:  Two-view chest x-ray 02/29/2016 FINDINGS: Heart is enlarged. Mild pulmonary vascular congestion is present. No significant airspace consolidation is present. Minimal bibasilar atelectasis is noted. IMPRESSION: 1. Stable cardiomegaly and mild pulmonary vascular congestion. 2. Minimal bibasilar atelectasis without focal airspace disease. Electronically Signed   By: San Morelle M.D.   On: 06/13/2019 09:39    Review of Systems  Constitutional: Negative for weight loss.  HENT: Negative for ear discharge, ear pain,  hearing loss and tinnitus.   Eyes: Negative for blurred vision, double vision, photophobia and pain.  Respiratory: Positive for shortness of breath. Negative for cough and sputum production.   Cardiovascular: Negative for chest pain.  Gastrointestinal: Negative for abdominal pain, nausea and vomiting.  Genitourinary: Negative for dysuria, flank pain, frequency and urgency.  Musculoskeletal: Positive for joint pain (Right wrist). Negative for back pain, falls, myalgias and neck pain.  Neurological: Negative for dizziness, tingling, sensory change, focal weakness, loss of consciousness and headaches.  Endo/Heme/Allergies: Does not bruise/bleed  easily.  Psychiatric/Behavioral: Negative for depression, memory loss and substance abuse. The patient is not nervous/anxious.    Blood pressure (!) 161/78, pulse 74, temperature 97.9 F (36.6 C), temperature source Oral, resp. rate 16, weight (!) 181.9 kg, last menstrual period 11/29/2015, SpO2 99 %. Physical Exam  Constitutional: She appears well-developed and well-nourished. No distress.  HENT:  Head: Normocephalic and atraumatic.  Eyes: Conjunctivae are normal. Right eye exhibits no discharge. Left eye exhibits no discharge. No scleral icterus.  Neck: Normal range of motion.  Cardiovascular: Normal rate and regular rhythm.  Respiratory: Effort normal. No respiratory distress.  Musculoskeletal:     Comments: Right shoulder, elbow, wrist, digits- no skin wounds, wrist severe TTP, mod edema  Sens  Ax/R/M/U intact  Mot   Ax/ R/ PIN/ M/ AIN/ U intact  Rad 2+  Neurological: She is alert.  Skin: Skin is warm and dry. She is not diaphoretic.  Psychiatric: She has a normal mood and affect. Her behavior is normal.    Assessment/Plan: Right wrist fx -- Will benefit from ORIF. Sugar tong splint, NWB for now.  Planning ORIF Monday 06-20-19 as outpatient here at [facility] (2/2 BMI restrictions for [facility]) Dyspnea/hypoxia -- Will need thorough workup before anesthesia will allow surgery. Recommend doing this as inpatient as she has been unsuccessful getting it worked up as OP. OSA Morbid obesity    Lisette Abu, PA-C Orthopedic Surgery [phone] 06/13/2019, 10:10 AM   S/p Mechanical fall at work today at [employer].  Has comminuted intra-articular dorsally tilted R DRFx that will benefit from ORIF.  NVI, with good digital motion.  D/w patient this plan.  Can be d/c anytime per hand surgery with plan for outpatient surgery on Monday, likely with regional block/MAC.  Micheline Rough, MD [facility] [phone]

## 2019-06-13 NOTE — Progress Notes (Signed)
Lower extremity venous has been completed.   Preliminary results in CV Proc.   Abram Sander 06/13/2019 11:47 AM

## 2019-06-13 NOTE — ED Notes (Signed)
Ambulated pt, per [place] - PA. Pt's O2 was primarily 91% during ambulation, but pt's O2 dropped as low as 86%. Pt stated that she felt "winded" and wheezing was heard. Pt's pulse was a steady 76. Informed April - RN.

## 2019-06-13 NOTE — ED Triage Notes (Signed)
Dinner meal delivered and assisted meal set up for PT.

## 2019-06-13 NOTE — ED Triage Notes (Signed)
Pt in with R wrist injury, s/p fall on grease floor at work. Obvious deformity noted, pulses present. Pt reports she fell backwards, tried to catch herself, landed on bottom, but R wrist took impact, did not hit head. Splinted on arrival

## 2019-06-14 DIAGNOSIS — S62101A Fracture of unspecified carpal bone, right wrist, initial encounter for closed fracture: Secondary | ICD-10-CM | POA: Diagnosis present

## 2019-06-14 DIAGNOSIS — W19XXXA Unspecified fall, initial encounter: Secondary | ICD-10-CM | POA: Diagnosis not present

## 2019-06-14 DIAGNOSIS — J9611 Chronic respiratory failure with hypoxia: Secondary | ICD-10-CM | POA: Diagnosis not present

## 2019-06-14 DIAGNOSIS — S52611A Displaced fracture of right ulna styloid process, initial encounter for closed fracture: Secondary | ICD-10-CM | POA: Diagnosis not present

## 2019-06-14 DIAGNOSIS — S52501A Unspecified fracture of the lower end of right radius, initial encounter for closed fracture: Secondary | ICD-10-CM | POA: Diagnosis not present

## 2019-06-14 LAB — BASIC METABOLIC PANEL
Anion gap: 15 (ref 5–15)
BUN: 9 mg/dL (ref 6–20)
CO2: 26 mmol/L (ref 22–32)
Calcium: 9.3 mg/dL (ref 8.9–10.3)
Chloride: 100 mmol/L (ref 98–111)
Creatinine, Ser: 0.78 mg/dL (ref 0.44–1.00)
GFR calc Af Amer: >60 mL/min (ref >60)
GFR calc non Af Amer: >60 mL/min (ref >60)
Glucose, Bld: 87 mg/dL (ref 70–99)
Potassium: 3.7 mmol/L (ref 3.5–5.1)
Sodium: 141 mmol/L (ref 135–145)

## 2019-06-14 LAB — CBC
HCT: 40 % (ref 36.0–46.0)
Hemoglobin: 12.6 g/dL (ref 12.0–15.0)
MCH: 28.8 pg (ref 26.0–34.0)
MCHC: 31.5 g/dL (ref 30.0–36.0)
MCV: 91.5 fL (ref 80.0–100.0)
Platelets: 191 10*3/uL (ref 150–400)
RBC: 4.37 MIL/uL (ref 3.87–5.11)
RDW: 13.5 % (ref 11.5–15.5)
WBC: 4.5 10*3/uL (ref 4.0–10.5)
nRBC: 0 % (ref 0.0–0.2)

## 2019-06-14 LAB — HIV ANTIBODY (ROUTINE TESTING W REFLEX): HIV Screen 4th Generation wRfx: NONREACTIVE

## 2019-06-14 LAB — MRSA PCR SCREENING: MRSA by PCR: NEGATIVE

## 2019-06-14 MED ORDER — IBUPROFEN 200 MG PO TABS: 400.0000 mg | ORAL_TABLET | Freq: Four times a day (QID) | ORAL | 0 refills | Status: DC | PRN

## 2019-06-14 NOTE — Plan of Care (Addendum)
  Problem: Health Behavior/Discharge Planning: Goal: Ability to manage health-related needs will improve Outcome: Adequate for Discharge Note: Received discharge order for patient. Discharge instructions reviewed with patient and her need for Echo prior to scheduled surgery. Notified MD regarding patient questions with time and location for her surgery on Monday 12/7. Number provided to [place] for her to call for this information. R cast and sling in place. At this time patient have stated understanding of discharge instructions. Patient with no further questions at this time. Pt stable in no acute distress. PIV removed, intact. Pt ambulated off unit. Nursing care complete.

## 2019-06-14 NOTE — Discharge Summary (Signed)
Name: Angel Brewer MRN: [id number] DOB: February 16, 1966 53 y.o. PCP: [name], NP  Date of Admission: 06/13/2019  6:29 AM Date of Discharge: 06/14/19 Attending Physician: Axel Filler, *  Discharge Diagnosis: 1. Comminuted displaced angulated distal right radial fracture. 2. Small ulnar styloid tip fracture. 3. Chronic hypoxic respiratory failure  Discharge Medications: Allergies as of 06/14/2019   No Known Allergies     Medication List    STOP taking these medications   benzonatate 100 MG capsule Commonly known as: TESSALON     TAKE these medications   albuterol 108 (90 Base) MCG/ACT inhaler Commonly known as: VENTOLIN HFA Inhale 2 puffs into the lungs every 6 (six) hours as needed for wheezing or shortness of breath.   diclofenac Sodium 1 % Gel Commonly known as: VOLTAREN Apply 1 application topically 4 (four) times daily as needed (knee pain).   ibuprofen 200 MG tablet Commonly known as: ADVIL Take 2 tablets (400 mg total) by mouth every 6 (six) hours as needed (leg pain). What changed: how much to take       Disposition and follow-up:   Angel Brewer was discharged from [facility] in Stable condition.  At the hospital follow up visit please address:  1.  Right wrist fracture. Seen by orthopedics. ORIF scheduled for 12/8.  2. Chronic hypoxic respiratory failure. History of OSA for which patient has a CPAP however has difficulties keeping it on overnight. Please try to discuss options. She would also likely benefit from PFTs outpatient for evaluation of obesity hypoventilation syndrome. Echo ordered for evaluation to rule out a component of heart failure.   2.  Labs / imaging needed at time of follow-up: Please ensure echocardiogram is complete.  I would also consider obtaining pulmonary function testing to evaluate for severity of sleep apnea and obesity hypoventilation syndrome.   Follow-up Appointments: Follow-up  Information    [name], NP Follow up in 3 day(s).   Specialty: Nurse Practitioner Contact information: [place] [address] [phone]           Hospital Course by problem list: This is a 53 year old female who presented to the ED on 06/13/2019 following a mechanical fall on outstretched hand.  X-ray imaging suggests distal radial fracture and a small fracture the ulnar styloid.  Orthopedics was consulted and is recommending ORIF. This was not done during the hospitalization as they had concerns about her cardiopulmonary status.  She was noted to become hypoxic while ambulating with O2 sats dropping into the mid 80s.  Orthopedics recommending a further cardiac work-up prior to ORIF which is scheduled for 12/8.  Hard cast is to remain in place until that time.  Chest x-ray significant for some mild pulmonary vascular congestion and bibasilar atelectasis without focal airspace disease.  CTA chest negative for PE.  Lower extremity venous duplex negative for VTE.  Chart review also indicates chronically elevated bicarb levels which would be consistent with the poorly treated OSA and OHS.  Physical exam was not overtly concerning for a heart failure exacerbation. BNP 24 however it should be noted that this value could be falsely lowered by her obesity. Patient was breathing on room air comfortably during exam on day of discharge. Ambulating O2 sats while on RA remained between 92-98% with a momentary drop to 89%.  Echocardiogram was ordered to be obtained on an outpatient basis.  Discharge Vitals:   BP 140/85 (BP Location: Left Arm)   Pulse 95  Temp 98.2 F (36.8 C) (Oral)   Resp 20   Wt (!) 181.9 kg   LMP 11/29/2015   SpO2 99%   BMI 60.97 kg/m   Pertinent Labs, Studies, and Procedures:  RIGHT WRIST XRAY: Comminuted, displaced, angulated distal right radial fracture. Extension to the radiocarpal joint space noted. Tiny ulnar styloid tip fracture.  CXR:  cardiomegaly. Mild pulmonary vascular congestion presesnt. No significant airspace consolidation present. Minimal bibasilar atelectasis noted.  CTA CHEST: Negative for pulmonary embolism to the lobar branch levels. Mosaic attenuation of lungs may represent air trapping versus small vessel or small airways disease.  Mildly enlarged right hilar and right paratracheal lymph node, nonspecific. Similar size and appearance compared to prior CT 02/29/2016.  LOWER EXTREMITY DUPLEX: no evidence of VTE  XRAY L-SPINE/T-SPINE: no acute abnormalities  Discharge Instructions: Discharge Instructions    Diet - low sodium heart healthy   Complete by: As directed    Increase activity slowly   Complete by: As directed       Signed: Mitzi Hansen, MD 06/14/2019, 5:13 PM   Pager: [phone]

## 2019-06-14 NOTE — ED Notes (Signed)
Escorted to 4NP09 via w/c. Pt voiced she has all of her belongings.

## 2019-06-14 NOTE — ED Notes (Signed)
Attempted to call report - RN to return call per Secretary.

## 2019-06-14 NOTE — Progress Notes (Addendum)
Pt ambulated in hall with continuous pulse ox. Her O2 sats remained between 92% -98% but momentarily dropped to 89% at the lowest. She stated "I only feel short of breath because I walked, but I feel better than I did earlier." Paged MD.

## 2019-06-14 NOTE — Progress Notes (Signed)
Pt admitted for ED. Pt alert and oriented x4, reports minimal pain 3/10 to right wrist that is wrapped with ace wrap dressing and placed in a sling. Heels and buttocks intact. Pending Echo. Pt oriented to unit. Report received from [place], ED RN.

## 2019-06-14 NOTE — ED Notes (Signed)
Breakfast Ordered 

## 2019-06-14 NOTE — ED Notes (Signed)
MS

## 2019-06-14 NOTE — Progress Notes (Signed)
   Subjective:  No acute events overnight, pt used CPAP during the night. Pt reports pain has improved. She reported only having pain when sleeping because it is hard to find a comfortable position in her sling. Pt reports acetaminophen works well to control pain. Pt reports falling asleep with the CPAP on, but states the CPAP did come off while she was sleeping.   Objective:  Vital signs in last 24 hours: Vitals:   06/13/19 1615 06/13/19 1630 06/13/19 2335 06/14/19 0632  BP: (!) 147/86 129/85  135/76  Pulse: 61 64 68 62  Resp: (!) _0 Temp:    98.1 F (36.7 C)  TempSrc:    Oral  SpO2: 96% 95% 94% 94%  Weight:       Weight change:  No intake or output data in the 24 hours ending 06/14/19 0725 Physical Exam Constitutional:      General: She is not in acute distress.    Appearance: She is obese.  Cardiovascular:     Rate and Rhythm: Normal rate and regular rhythm.     Heart sounds: Normal heart sounds. No murmur. No friction rub. No gallop.   Pulmonary:     Effort: No respiratory distress.     Breath sounds: Decreased air movement present. No wheezing.     Comments: Decreased breath sounds Musculoskeletal:     Right lower leg: Edema present.     Left lower leg: Edema present.     Comments: No swelling appreciated in L hand or fingers. LE Edema improved from yesterday.  Neurological:     Mental Status: She is alert.  Psychiatric:        Mood and Affect: Mood normal.    VBG: pH 7.44, pCO2 47.4, HCO3 32.7  Assessment/Plan:  Ms. Mccreadie is a 53 yo female with a PMHx of PE, OSA, essential hypertension, and morbid obesity who presented after a fall resulting in a R distal radius fracture and subsequent findings of hypoxia and dyspnea on exertion. Based on decreased breath sounds, elevated diaphragm on Chest X-ray, and hypercapnia with compensatory elevated bicarbonate measured by VBG, the most likely diagnosis for this patient is mechanical obstruction 2/2 obesity  hypoventilation syndrome. Pt could potentially have early signs of heart failure that should be further evaluated, especially due to her increased risk for pulmonary hypertension due to her OSA. DVT/PE should still be considered based on risk factors of previous VTE and obesity.  Active Problems:  Dyspnea on exertion  Wrist fracture  Hx of hypertension  Dyspnea on exertion Pt has endorses improved breathing with CPAP machine use. O2 sat and blood pressure have remained stable. Lower extremity edema has improved with Lasix. Heart failure is still being considered as a contributory factor for the dyspnea.  - Will order echo to evaluate for heart failure - Will order ambulatory pulse ox to assess for - Encouraged pt to start proper CPAP use - Counseled pt on outpt f/u for PFT's  Wrist fracture Pain in wrist is well-controlled with Tylenol. No swelling appreciated in hand suggests proper splint placement - ortho on board, recs greatly appreciated - ORIF scheduled for 12/7  Hx of hypertension Lasix 20 mg IV given at 2300 yesterday. I/O measurements have been ordered. BP has ranged 157-262 systolic and 03-55 diastolic over past 16 hours. - Will continue to monitor   LOS: 0 days   Carin Primrose, Medical Student 06/14/2019, 7:25 AM

## 2019-06-15 NOTE — Progress Notes (Signed)
[id number] [place] [facility] [address] [place] [id number]: [phone]: [phone]     Your procedure is scheduled on Monday, December 7th.  [facility]., and check in at the Admitting  office.  Call this number if you have problems the morning of surgery:  [phone] Call [phone] you have any questions prior to your surgery date Monday-Friday 8am-4pm    Remember:  Do not eat after midnight the night before your surgery  You may drink clear liquids until 4:30 AM the morning of your surgery.   Clear liquids allowed are: Water, Non-Citrus Juices (without pulp), Carbonated Beverages, Clear Tea, Black Coffee Only, and Gatorade    Take these medicines the morning of surgery   Albuterol inhaler - if needed    7 days prior to surgery STOP taking any Aspirin (unless otherwise instructed by your surgeon), Aleve, Naproxen, Ibuprofen, Motrin, Advil, Goody's, BC's, all herbal medications, fish oil, and all vitamins.    The Morning of Surgery  Do not wear jewelry, make-up or nail polish.  Do not wear lotions, powders, or perfumes, or deodorant  Do not shave 48 hours prior to surgery.  .  Do not bring valuables to the hospital.  [facility] any belongings or [facility]ables.  If you are a smoker, DO NOT Smoke 24 hours prior to surgery  If you wear a CPAP at night please bring your mask, tubing, and machine the morning of surgery   Remember that you must have someone to transport you home after your surgery, and remain with you for 24 hours if you are discharged the same day.   Please bring cases for contacts, glasses, hearing aids, dentures or bridgework because it cannot be worn into surgery.    Leave your suitcase in the car.  After surgery it may be brought to your room.  For patients admitted to the hospital, discharge time will be determined by your  treatment team.  Patients discharged the day of surgery will not be allowed to drive home.    Special instructions:   [facility]- Preparing For Surgery  Before surgery, you can play an important role. Because skin is not sterile, your skin needs to be as free of germs as possible. You can reduce the number of germs on your skin by washing with CHG (chlorahexidine gluconate) Soap before surgery.  CHG is an antiseptic cleaner which kills germs and bonds with the skin to continue killing germs even after washing.    Oral Hygiene is also important to reduce your risk of infection.  Remember - BRUSH YOUR TEETH THE MORNING OF SURGERY WITH YOUR REGULAR TOOTHPASTE  Please do not use if you have an allergy to CHG or antibacterial soaps. If your skin becomes reddened/irritated stop using the CHG.  Do not shave (including legs and underarms) for at least 48 hours prior to first CHG shower. It is OK to shave your face.  Please follow these instructions carefully.   1. Shower the NIGHT BEFORE SURGERY and the MORNING OF SURGERY with CHG Soap.   2. If you chose to wash your hair, wash your hair first as usual with your normal shampoo.  3. After you shampoo, rinse your hair and body thoroughly to remove the shampoo.  4. Use CHG as you would any other liquid soap. You can apply CHG directly to the skin and wash  gently with a scrungie or a clean washcloth.   5. Apply the CHG Soap to your body ONLY FROM THE NECK DOWN.  Do not use on open wounds or open sores. Avoid contact with your eyes, ears, mouth and genitals (private parts). Wash Face and genitals (private parts)  with your normal soap.   6. Wash thoroughly, paying special attention to the area where your surgery will be performed.  7. Thoroughly rinse your body with warm water from the neck down.  8. DO NOT shower/wash with your normal soap after using and rinsing off the CHG Soap.  9. Pat yourself dry with a CLEAN TOWEL.  10. Wear CLEAN  PAJAMAS to bed the night before surgery, wear comfortable clothes the morning of surgery  11. Place CLEAN SHEETS on your bed the night of your first shower and DO NOT SLEEP WITH PETS.    Day of Surgery:  Please shower the morning of surgery with the CHG soap Do not apply any deodorants/lotions. Please wear clean clothes to the hospital/surgery center.   Remember to brush your teeth WITH YOUR REGULAR TOOTHPASTE.   Please read over the following fact sheets that you were given.

## 2019-06-16 ENCOUNTER — Encounter (HOSPITAL_COMMUNITY): Payer: Self-pay

## 2019-06-16 ENCOUNTER — Ambulatory Visit (HOSPITAL_BASED_OUTPATIENT_CLINIC_OR_DEPARTMENT_OTHER): Payer: Medicaid Other

## 2019-06-16 ENCOUNTER — Other Ambulatory Visit (HOSPITAL_COMMUNITY)
Admission: RE | Admit: 2019-06-16 | Discharge: 2019-06-16 | Disposition: A | Discharge status: Home / Self Care | Payer: Medicaid Other | Source: Ambulatory Visit | Attending: Orthopedic Surgery | Admitting: Orthopedic Surgery

## 2019-06-16 ENCOUNTER — Encounter (HOSPITAL_COMMUNITY)
Admission: RE | Admit: 2019-06-16 | Discharge: 2019-06-16 | Disposition: A | Discharge status: Home / Self Care | Payer: Worker's Compensation | Source: Ambulatory Visit | Attending: Orthopedic Surgery | Admitting: Orthopedic Surgery

## 2019-06-16 ENCOUNTER — Other Ambulatory Visit: Payer: Self-pay

## 2019-06-16 DIAGNOSIS — Z6841 Body Mass Index (BMI) 40.0 and over, adult: Secondary | ICD-10-CM | POA: Diagnosis not present

## 2019-06-16 DIAGNOSIS — Z87891 Personal history of nicotine dependence: Secondary | ICD-10-CM | POA: Insufficient documentation

## 2019-06-16 DIAGNOSIS — Y939 Activity, unspecified: Secondary | ICD-10-CM | POA: Insufficient documentation

## 2019-06-16 DIAGNOSIS — I10 Essential (primary) hypertension: Secondary | ICD-10-CM | POA: Insufficient documentation

## 2019-06-16 DIAGNOSIS — G4730 Sleep apnea, unspecified: Secondary | ICD-10-CM | POA: Insufficient documentation

## 2019-06-16 DIAGNOSIS — Z01812 Encounter for preprocedural laboratory examination: Secondary | ICD-10-CM | POA: Insufficient documentation

## 2019-06-16 DIAGNOSIS — Z20828 Contact with and (suspected) exposure to other viral communicable diseases: Secondary | ICD-10-CM | POA: Diagnosis not present

## 2019-06-16 DIAGNOSIS — I82401 Acute embolism and thrombosis of unspecified deep veins of right lower extremity: Secondary | ICD-10-CM | POA: Insufficient documentation

## 2019-06-16 DIAGNOSIS — J9611 Chronic respiratory failure with hypoxia: Secondary | ICD-10-CM | POA: Insufficient documentation

## 2019-06-16 DIAGNOSIS — Z86711 Personal history of pulmonary embolism: Secondary | ICD-10-CM | POA: Insufficient documentation

## 2019-06-16 DIAGNOSIS — Z79899 Other long term (current) drug therapy: Secondary | ICD-10-CM | POA: Insufficient documentation

## 2019-06-16 DIAGNOSIS — Y929 Unspecified place or not applicable: Secondary | ICD-10-CM | POA: Insufficient documentation

## 2019-06-16 DIAGNOSIS — S52501A Unspecified fracture of the lower end of right radius, initial encounter for closed fracture: Secondary | ICD-10-CM | POA: Insufficient documentation

## 2019-06-16 DIAGNOSIS — Y999 Unspecified external cause status: Secondary | ICD-10-CM | POA: Insufficient documentation

## 2019-06-16 HISTORY — DX: Dyspnea, unspecified: R06.00

## 2019-06-16 HISTORY — DX: Peripheral vascular disease, unspecified: I73.9

## 2019-06-16 HISTORY — DX: Deep phlebothrombosis in pregnancy, unspecified trimester: O22.30

## 2019-06-16 LAB — ECHOCARDIOGRAM COMPLETE
Height: 68 in
Weight: 6585 oz

## 2019-06-16 MED ORDER — PERFLUTREN LIPID MICROSPHERE
1.0000 mL | INTRAVENOUS | Status: AC | PRN
  Administered 2019-06-16: 2 mL via INTRAVENOUS

## 2019-06-16 NOTE — Pre-Procedure Instructions (Addendum)
Teiana Schow  06/16/2019    Your procedure is scheduled on Monday, June 20, 2019 at 9:30 AM.   Report to [facility] Entrance "A" Admitting Office at 7:30 AM.   Call this number if you have problems the morning of surgery: [phone]   Questions prior to day of surgery, please call [phone] between 8 & 4 PM.   Remember:  Do not eat food after midnight Sunday, 06/19/2019.  You may drink clear liquids until 6:30 AM.  Clear liquids allowed are:  Water, Juice (non-citric and without pulp), Carbonated beverages, Clear Tea, Black Coffee only, Plain Jell-O only, Gatorade and Plain Popsicles only  Drink the Pre-Surgery Ensure between 6:15 AM and 6:30 AM day of srugery.    Take these medicines the morning of surgery: Albuterol inhaler - if needed (bring inhaler with you day of surgery.  Stop NSAIDS (Ibuprofen, Diclofenac, Voltaren, Aleve, etc) as of today prior to surgery. Do not use Aspirin products (BC Powders, Goody's, etc), Herbal medications, Fish Oil or Multivitamins prior to surgery.    Do not wear jewelry, make-up or nail polish.  Do not wear lotions, powders, perfumes or deodorant.  Do not shave 48 hours prior to surgery.    Do not bring valuables to the hospital.  [facility] is not responsible for any belongings or valuables.  Contacts, dentures or bridgework may not be worn into surgery.  Leave your suitcase in the car.  After surgery it may be brought to your room.  For patients admitted to the hospital, discharge time will be determined by your treatment team.  Patients discharged the day of surgery will not be allowed to drive home.   [facility] - Preparing for Surgery  Before surgery, you can play an important role.  Because skin is not sterile, your skin needs to be as free of germs as possible.  You can reduce the number of germs on you skin by washing with CHG (chlorahexidine gluconate) soap before surgery.  CHG is an antiseptic cleaner which kills  germs and bonds with the skin to continue killing germs even after washing.  Oral Hygiene is also important in reducing the risk of infection.  Remember to brush your teeth with your regular toothpaste the morning of surgery.  Please DO NOT use if you have an allergy to CHG or antibacterial soaps.  If your skin becomes reddened/irritated stop using the CHG and inform your nurse when you arrive at Short Stay.  Do not shave (including legs and underarms) for at least 48 hours prior to the first CHG shower.  You may shave your face.  Please follow these instructions carefully:   1.  Shower with CHG Soap the night before surgery and the morning of Surgery.  2.  If you choose to wash your hair, wash your hair first as usual with your normal shampoo.  3.  After you shampoo, rinse your hair and body thoroughly to remove the shampoo. 4.  Use CHG as you would any other liquid soap.  You can apply chg directly to the skin and wash gently with a      scrungie or washcloth.           5.  Apply the CHG Soap to your body ONLY FROM THE NECK DOWN.   Do not use on open wounds or open sores. Avoid contact with your eyes, ears, mouth and genitals (private parts).  Wash genitals (private parts) with your normal soap - do this  prior to using CHG soap.  6.  Wash thoroughly, paying special attention to the area where your surgery will be performed.  7.  Thoroughly rinse your body with warm water from the neck down.  8.  DO NOT shower/wash with your normal soap after using and rinsing off the CHG Soap.  9.  Pat yourself dry with a clean towel.            10.  Wear clean pajamas.            11.  Place clean sheets on your bed the night of your first shower and do not sleep with pets.  Day of Surgery  Shower as above. Do not apply any lotions/deodorants the morning of surgery.   Please wear clean clothes to the hospital. Remember to brush your teeth with toothpaste.   Please read over the fact sheets that you  were given.

## 2019-06-16 NOTE — Progress Notes (Signed)
PCP - [facility] Cardiologist -   Chest x-ray - 06/13/19 EKG - 06/14/19 Stress Test - denies ECHO - to have it done this afternoon Cardiac Cath - denies  Sleep Study - 2018 (in Epic) CPAP - yes  Blood Thinner Instructions: N/A Aspirin Instructions: N/A  ERAS Protcol -Yes PRE-SURGERY Ensure or G2- Ensure  COVID TEST- to be done today.   Anesthesia review: yes, to check Echo for clearance  Patient denies shortness of breath, fever, cough and chest pain at PAT appointment   All instructions explained to the patient, with a verbal understanding of the material. Patient agrees to go over the instructions while at home for a better understanding. Patient also instructed to self quarantine after being tested for COVID-19. The opportunity to ask questions was provided.

## 2019-06-17 ENCOUNTER — Encounter (HOSPITAL_COMMUNITY): Payer: Self-pay

## 2019-06-17 MED ORDER — DEXTROSE 5 % IV SOLN
3.0000 g | INTRAVENOUS | Status: AC
  Administered 2019-06-20: 10:00:00 3 g via INTRAVENOUS
  Filled 2019-06-17: qty 3

## 2019-06-17 NOTE — Progress Notes (Signed)
Anesthesia Chart Review:  Case: [id number] Date/Time: 06/20/19 0915   Procedure: OPEN REDUCTION INTERNAL FIXATION (ORIF) DISTAL RADIAL FRACTURE (Right ) - REGIONAL BLOCK TOTAL SURGERY TIME: 90 MINUTES   Anesthesia type: Monitor Anesthesia Care   Pre-op diagnosis: RIGHT DISTAL RADIUS FRACTURE   Location: [facility] OR ROOM 06 / [place] [facility]   Surgeon: [name], MD      DISCUSSION: Patient is a 53 year old female scheduled for the above procedure.  History includes former smoker, RLE DVT/bilateral PE (02/2016), PVD, OSA (CPAP), morbid obesity. - Admitted 06/13/19-06/14/19 falling fall at work in which she sustained a comminuted displaced angulated distal right radial fracture and tiny ulnar styloid tip fracture. Ortho consulted and recommended ORIF. Surgery postponed until further evaluated for ambulatory oxygen desaturations. CXR showed mild vascular congestion. CTA was negative for PE. Venous duplex negative for BLE DVT. BNP 24, although consideration for falsely low value due to her obesity. Echo showed normal LVEF, grade 1 diastolic dysfunction, no significant valvular disease. Prior to discharge, Lalla Brothers, MD wrote (in regards to ambulatory desaturations, "We think that this is a multifactorial issue, probably related to obstructive sleep apnea and early obesity hypoventilation syndrome.  Does not seem to have chronic lung disease, but is at risk for pulmonary hypertension due to those conditions.  PE ruled out, pneumonia ruled out, pulmonary edema ruled out.  We will ambulate her today and recheck oxygen saturations.  If they are below 89%, will arrange for outpatient supplemental oxygen.  I think this is a chronic issue that is not likely to resolve without significant weight loss.  She is arranged for surgery on the right wrist next week, I think it is okay to go forward with that surgery as this chronic medical condition is stable and well compensated for."  According to discharge summary, prior to  discharge ambulating O2 sats while on RA remained between 92-98% with a momentary drop to 89%. Primary care follow-up with consideration for out-patient PFTs to evaluate for obesity hypoventilation syndrome and further discussion on improving CPAP compliance (patient having difficulties keeping mask on overnight).  06/16/19 COVID-19 test in process. If negative and otherwise no acute changes and I would anticipate that she could proceed as planned.   VS: BP 135/76   Pulse 75   Temp (!) 36.3 C (Oral)   Resp 18   Ht _0  (1.727 m)   Wt (!) 186.7 kg   LMP 11/29/2015   SpO2 98%   BMI 62.58 kg/m     PROVIDERS: [name], NP - She is not routinely followed by hematology, but saw Ophelia Shoulder, MD in 2018 following DVT/PE. She completed six months of anticoagulation. Dr. Thana Farr wrote, "All the workup for hypercoagulation syndrome has been so far negative. Hence I do believe, her DVT and pulmonary embolism might be related to her long car trip to [place]. I have also discussed with patient,  her morbid obesity is also a contributing factor."   LABS: Labs as of 06/14/19 include: Lab Results  Component Value Date   WBC 4.5 06/14/2019   HGB 12.6 06/14/2019   HCT 40.0 06/14/2019   PLT 191 06/14/2019   GLUCOSE 87 06/14/2019   ALT 10 06/13/2019   AST 17 06/13/2019   NA 141 06/14/2019   K 3.7 06/14/2019   CL 100 06/14/2019   CREATININE 0.78 06/14/2019   BUN 9 06/14/2019   CO2 26 06/14/2019     IMAGES: CTA Chest 06/13/19: IMPRESSION: 1. Negative for  pulmonary embolism to the lobar branch levels. 2. Mosaic attenuation of lungs may represent air trapping versus small vessel or small airways disease. 3. Mildly enlarged right hilar and right paratracheal lymph node, nonspecific. Similar size and appearance compared to prior CT 02/29/2016.  Xray right wrist 06/13/19: IMPRESSION: Comminuted, displaced, angulated distal right radial fracture. Extension to the radiocarpal joint  space noted. Tiny ulnar styloid tip fracture.   EKG: 06/13/19: NSR   CV: Echo 06/16/19: - Sonographer Comments: Technically difficult study due to poor echo windows and patient is morbidly obese. Patient unable to lay on side. IMPRESSIONS  1. Left ventricular ejection fraction, by visual estimation, is 60 to 65%. The left ventricle has normal function. Left ventricular septal wall thickness was normal. Mildly increased left ventricular posterior wall thickness. There is mildly increased  left ventricular hypertrophy.  2. Definity contrast agent was given IV to delineate the left ventricular endocardial borders.  3. Left ventricular diastolic parameters are consistent with Grade I diastolic dysfunction (impaired relaxation).  4. Global right ventricle has normal systolic function.The right ventricular size is normal. No increase in right ventricular wall thickness.  5. Left atrial size was normal.  6. Right atrial size was mildly dilated.  7. The mitral valve is normal in structure. No evidence of mitral valve regurgitation. No evidence of mitral stenosis.  8. The tricuspid valve is normal in structure. Tricuspid valve regurgitation is not demonstrated.  9. The aortic valve is normal in structure. Aortic valve regurgitation is not visualized. No evidence of aortic valve sclerosis or stenosis. 10. The pulmonic valve was normal in structure. Pulmonic valve regurgitation is not visualized.  BLE venous US 06/13/19: Summary: Right: There is no evidence of deep vein thrombosis in the lower extremity. However, portions of this examination were limited- see technologist comments above. No cystic structure found in the popliteal fossa. Left: There is no evidence of deep vein thrombosis in the lower extremity. However, portions of this examination were limited- see technologist comments above.There is no evidence of deep vein thrombosis proximal to the inguinal ligament or in the common  femoral  vein. No cystic structure found in the popliteal fossa.   Past Medical History:  Diagnosis Date  . DVT (deep vein thrombosis) in pregnancy    RLE DVT 02/2016  . Dyspnea   . Morbid obesity ([place])   . PE (pulmonary embolism) 02/29/2016  . Peripheral vascular disease ([place])   . Sleep apnea    uses a cpap    Past Surgical History:  Procedure Laterality Date  . CESAREAN SECTION     x 3    MEDICATIONS: . albuterol (VENTOLIN HFA) 108 (90 Base) MCG/ACT inhaler  . diclofenac Sodium (VOLTAREN) 1 % GEL  . ibuprofen (ADVIL) 200 MG tablet   No current facility-administered medications for this encounter.      Myra Gianotti, PA-C Surgical Short Stay/Anesthesiology [facility] Phone [phone] [facility] Phone [phone] 06/17/2019 10:35 AM

## 2019-06-17 NOTE — Anesthesia Preprocedure Evaluation (Addendum)
Anesthesia Evaluation  Patient identified by MRN, date of birth, ID band Patient awake    Reviewed: Allergy & Precautions, NPO status , Patient's Chart, lab work & pertinent test results  Airway Mallampati: II  TM Distance: >3 FB Neck ROM: Full    Dental   Pulmonary sleep apnea , former smoker,    Pulmonary exam normal        Cardiovascular hypertension, Pt. on medications Normal cardiovascular exam     Neuro/Psych    GI/Hepatic   Endo/Other    Renal/GU      Musculoskeletal   Abdominal   Peds  Hematology   Anesthesia Other Findings   Reproductive/Obstetrics                            Anesthesia Physical Anesthesia Plan  ASA: III  Anesthesia Plan: Regional   Post-op Pain Management:    Induction: Intravenous  PONV Risk Score and Plan: 2 and Midazolam and Treatment may vary due to age or medical condition  Airway Management Planned: Simple Face Mask  Additional Equipment:   Intra-op Plan:   Post-operative Plan:   Informed Consent: I have reviewed the patients History and Physical, chart, labs and discussed the procedure including the risks, benefits and alternatives for the proposed anesthesia with the patient or authorized representative who has indicated his/her understanding and acceptance.       Plan Discussed with: CRNA and Surgeon  Anesthesia Plan Comments: (PAT note written 06/17/2019 by Myra Gianotti, PA-C. )       Anesthesia Quick Evaluation

## 2019-06-19 LAB — NOVEL CORONAVIRUS, NAA (HOSP ORDER, SEND-OUT TO REF LAB; TAT 18-24 HRS): SARS-CoV-2, NAA: NOT DETECTED

## 2019-06-20 ENCOUNTER — Ambulatory Visit (HOSPITAL_COMMUNITY): Payer: Worker's Compensation | Admitting: Vascular Surgery

## 2019-06-20 ENCOUNTER — Ambulatory Visit (HOSPITAL_COMMUNITY)
Admission: RE | Admit: 2019-06-20 | Discharge: 2019-06-20 | Disposition: A | Discharge status: Home / Self Care | Payer: Worker's Compensation | Attending: Orthopedic Surgery | Admitting: Orthopedic Surgery

## 2019-06-20 ENCOUNTER — Encounter (HOSPITAL_COMMUNITY)
Admission: RE | Disposition: A | Discharge status: Home / Self Care | Payer: Self-pay | Source: Home / Self Care | Attending: Orthopedic Surgery

## 2019-06-20 ENCOUNTER — Ambulatory Visit (HOSPITAL_COMMUNITY): Payer: Medicaid Other | Attending: Orthopedic Surgery

## 2019-06-20 ENCOUNTER — Encounter (HOSPITAL_COMMUNITY): Payer: Self-pay | Admitting: *Deleted

## 2019-06-20 ENCOUNTER — Ambulatory Visit (HOSPITAL_COMMUNITY): Payer: Worker's Compensation | Admitting: Anesthesiology

## 2019-06-20 DIAGNOSIS — X58XXXA Exposure to other specified factors, initial encounter: Secondary | ICD-10-CM | POA: Insufficient documentation

## 2019-06-20 DIAGNOSIS — Z9989 Dependence on other enabling machines and devices: Secondary | ICD-10-CM | POA: Diagnosis not present

## 2019-06-20 DIAGNOSIS — W010XXA Fall on same level from slipping, tripping and stumbling without subsequent striking against object, initial encounter: Secondary | ICD-10-CM | POA: Insufficient documentation

## 2019-06-20 DIAGNOSIS — Z419 Encounter for procedure for purposes other than remedying health state, unspecified: Secondary | ICD-10-CM

## 2019-06-20 DIAGNOSIS — I739 Peripheral vascular disease, unspecified: Secondary | ICD-10-CM | POA: Insufficient documentation

## 2019-06-20 DIAGNOSIS — Z87891 Personal history of nicotine dependence: Secondary | ICD-10-CM | POA: Insufficient documentation

## 2019-06-20 DIAGNOSIS — S52571A Other intraarticular fracture of lower end of right radius, initial encounter for closed fracture: Secondary | ICD-10-CM | POA: Diagnosis not present

## 2019-06-20 DIAGNOSIS — G4733 Obstructive sleep apnea (adult) (pediatric): Secondary | ICD-10-CM | POA: Insufficient documentation

## 2019-06-20 DIAGNOSIS — Z6841 Body Mass Index (BMI) 40.0 and over, adult: Secondary | ICD-10-CM | POA: Diagnosis not present

## 2019-06-20 DIAGNOSIS — J9611 Chronic respiratory failure with hypoxia: Secondary | ICD-10-CM | POA: Diagnosis not present

## 2019-06-20 DIAGNOSIS — I10 Essential (primary) hypertension: Secondary | ICD-10-CM | POA: Diagnosis not present

## 2019-06-20 DIAGNOSIS — Y990 Civilian activity done for income or pay: Secondary | ICD-10-CM | POA: Insufficient documentation

## 2019-06-20 DIAGNOSIS — S52501A Unspecified fracture of the lower end of right radius, initial encounter for closed fracture: Secondary | ICD-10-CM | POA: Diagnosis not present

## 2019-06-20 HISTORY — PX: OPEN REDUCTION INTERNAL FIXATION (ORIF) DISTAL RADIAL FRACTURE: SHX5989

## 2019-06-20 SURGERY — OPEN REDUCTION INTERNAL FIXATION (ORIF) DISTAL RADIUS FRACTURE
Anesthesia: Regional | Laterality: Right

## 2019-06-20 MED ORDER — MEPERIDINE HCL 25 MG/ML IJ SOLN
6.2500 mg | INTRAMUSCULAR | Status: DC | PRN
  Administered 2019-06-20: 12.5 mg via INTRAVENOUS

## 2019-06-20 MED ORDER — MIDAZOLAM HCL 2 MG/2ML IJ SOLN
1.0000 mg | Freq: Once | INTRAMUSCULAR | Status: AC
  Administered 2019-06-20: 09:00:00 1 mg via INTRAVENOUS

## 2019-06-20 MED ORDER — 0.9 % SODIUM CHLORIDE (POUR BTL) OPTIME
TOPICAL | Status: DC | PRN
  Administered 2019-06-20: 1000 mL

## 2019-06-20 MED ORDER — LIDOCAINE HCL (PF) 1 % IJ SOLN
INTRAMUSCULAR | Status: AC
  Filled 2019-06-20: qty 30

## 2019-06-20 MED ORDER — IBUPROFEN 200 MG PO TABS: 600.0000 mg | ORAL_TABLET | Freq: Four times a day (QID) | ORAL | 0 refills | Status: DC

## 2019-06-20 MED ORDER — ONDANSETRON 4 MG PO TBDP
4.0000 mg | ORAL_TABLET | Freq: Once | ORAL | Status: DC
  Filled 2019-06-20: qty 1

## 2019-06-20 MED ORDER — FENTANYL CITRATE (PF) 100 MCG/2ML IJ SOLN
25.0000 ug | Freq: Once | INTRAMUSCULAR | Status: AC
  Administered 2019-06-20: 09:00:00 25 ug via INTRAVENOUS

## 2019-06-20 MED ORDER — HYDROMORPHONE HCL 1 MG/ML IJ SOLN
INTRAMUSCULAR | Status: AC
  Filled 2019-06-20: qty 1

## 2019-06-20 MED ORDER — MEPERIDINE HCL 25 MG/ML IJ SOLN
INTRAMUSCULAR | Status: AC
  Filled 2019-06-20: qty 1

## 2019-06-20 MED ORDER — BUPIVACAINE HCL (PF) 0.25 % IJ SOLN
INTRAMUSCULAR | Status: AC
  Filled 2019-06-20: qty 30

## 2019-06-20 MED ORDER — ONDANSETRON HCL 4 MG/2ML IJ SOLN: 4.0000 mg | Freq: Once | INTRAMUSCULAR | Status: DC | PRN

## 2019-06-20 MED ORDER — MIDAZOLAM HCL 2 MG/2ML IJ SOLN
INTRAMUSCULAR | Status: AC
  Administered 2019-06-20: 1 mg via INTRAVENOUS
  Filled 2019-06-20: qty 2

## 2019-06-20 MED ORDER — PROPOFOL 10 MG/ML IV BOLUS
INTRAVENOUS | Status: AC
  Filled 2019-06-20: qty 20

## 2019-06-20 MED ORDER — FENTANYL CITRATE (PF) 250 MCG/5ML IJ SOLN
INTRAMUSCULAR | Status: AC
  Filled 2019-06-20: qty 5

## 2019-06-20 MED ORDER — OXYCODONE HCL 5 MG PO TABS: 5.0000 mg | ORAL_TABLET | Freq: Four times a day (QID) | ORAL | 0 refills | Status: DC | PRN

## 2019-06-20 MED ORDER — LACTATED RINGERS IV SOLN
INTRAVENOUS | Status: DC
  Administered 2019-06-20: 08:00:00 via INTRAVENOUS

## 2019-06-20 MED ORDER — FENTANYL CITRATE (PF) 100 MCG/2ML IJ SOLN
INTRAMUSCULAR | Status: AC
  Administered 2019-06-20: 25 ug via INTRAVENOUS
  Filled 2019-06-20: qty 2

## 2019-06-20 MED ORDER — ACETAMINOPHEN 325 MG PO TABS: 650.0000 mg | ORAL_TABLET | Freq: Four times a day (QID) | ORAL | Status: DC

## 2019-06-20 MED ORDER — LIDOCAINE 2% (20 MG/ML) 5 ML SYRINGE
INTRAMUSCULAR | Status: DC | PRN
  Administered 2019-06-20: 40 mg via INTRAVENOUS

## 2019-06-20 MED ORDER — PROPOFOL 500 MG/50ML IV EMUL
INTRAVENOUS | Status: DC | PRN
  Administered 2019-06-20: 30 ug/kg/min via INTRAVENOUS

## 2019-06-20 MED ORDER — FENTANYL CITRATE (PF) 250 MCG/5ML IJ SOLN
INTRAMUSCULAR | Status: DC | PRN
  Administered 2019-06-20 (×3): 50 ug via INTRAVENOUS

## 2019-06-20 MED ORDER — CHLORHEXIDINE GLUCONATE 4 % EX LIQD: 60.0000 mL | Freq: Once | CUTANEOUS | Status: DC

## 2019-06-20 MED ORDER — BUPIVACAINE-EPINEPHRINE (PF) 0.5% -1:200000 IJ SOLN
INTRAMUSCULAR | Status: DC | PRN
  Administered 2019-06-20: 30 mL via PERINEURAL

## 2019-06-20 MED ORDER — HYDROMORPHONE HCL 1 MG/ML IJ SOLN
0.2500 mg | INTRAMUSCULAR | Status: DC | PRN
  Administered 2019-06-20 (×2): 0.5 mg via INTRAVENOUS

## 2019-06-20 SURGICAL SUPPLY — 58 items
BIT DRILL SOLID 2.0X40MM (BIT) IMPLANT
BIT DRILL SOLID 2.5X40MM (BIT) IMPLANT
BLADE SURG 15 STRL LF DISP TIS (BLADE) ×1 IMPLANT
BLADE SURG 15 STRL SS (BLADE) ×2
BNDG COHESIVE 2X5 TAN STRL LF (GAUZE/BANDAGES/DRESSINGS) ×3 IMPLANT
BNDG COHESIVE 4X5 TAN STRL (GAUZE/BANDAGES/DRESSINGS) ×3 IMPLANT
BNDG ESMARK 4X9 LF (GAUZE/BANDAGES/DRESSINGS) ×3 IMPLANT
BNDG GAUZE ELAST 4 BULKY (GAUZE/BANDAGES/DRESSINGS) ×3 IMPLANT
BRUSH SCRUB EZ PLAIN DRY (MISCELLANEOUS) ×3 IMPLANT
CANISTER SUCT 3000ML PPV (MISCELLANEOUS) ×3 IMPLANT
CHLORAPREP W/TINT 26 (MISCELLANEOUS) ×3 IMPLANT
CORD BIPOLAR FORCEPS 12FT (ELECTRODE) ×3 IMPLANT
COVER BACK TABLE 60X90IN (DRAPES) ×3 IMPLANT
COVER SURGICAL LIGHT HANDLE (MISCELLANEOUS) ×3 IMPLANT
COVER WAND RF STERILE (DRAPES) ×3 IMPLANT
CUFF TOURN SGL QUICK 18X4 (TOURNIQUET CUFF) IMPLANT
CUFF TOURN SGL QUICK 24 (TOURNIQUET CUFF) ×2
CUFF TRNQT CYL 24X4X16.5-23 (TOURNIQUET CUFF) ×1 IMPLANT
DRAPE C-ARM 42X72 X-RAY (DRAPES) ×3 IMPLANT
DRAPE SURG 17X23 STRL (DRAPES) ×3 IMPLANT
DRILL SOLID 2.0X40MM (BIT)
DRILL SOLID 2.5X40MM (BIT)
DRSG ADAPTIC 3X8 NADH LF (GAUZE/BANDAGES/DRESSINGS) ×3 IMPLANT
DRSG EMULSION OIL 3X3 NADH (GAUZE/BANDAGES/DRESSINGS) IMPLANT
GAUZE SPONGE 4X4 12PLY STRL (GAUZE/BANDAGES/DRESSINGS) ×3 IMPLANT
GLOVE BIO SURGEON STRL SZ7.5 (GLOVE) ×3 IMPLANT
GLOVE BIOGEL PI IND STRL 8 (GLOVE) ×1 IMPLANT
GLOVE BIOGEL PI INDICATOR 8 (GLOVE) ×2
GOWN STRL REUS W/ TWL XL LVL3 (GOWN DISPOSABLE) ×1 IMPLANT
GOWN STRL REUS W/TWL XL LVL3 (GOWN DISPOSABLE) ×2
GUIDE AIMING 1.5MM (WIRE) IMPLANT
KIT BASIN OR (CUSTOM PROCEDURE TRAY) ×3 IMPLANT
NEEDLE HYPO 22GX1.5 SAFETY (NEEDLE) ×3 IMPLANT
NEEDLE HYPO 25X1 1.5 SAFETY (NEEDLE) IMPLANT
NS IRRIG 1000ML POUR BTL (IV SOLUTION) ×3 IMPLANT
PACK ORTHO EXTREMITY (CUSTOM PROCEDURE TRAY) ×3 IMPLANT
PAD CAST 4YDX4 CTTN HI CHSV (CAST SUPPLIES) ×1 IMPLANT
PADDING CAST ABS 4INX4YD NS (CAST SUPPLIES) ×2
PADDING CAST ABS COTTON 4X4 ST (CAST SUPPLIES) ×1 IMPLANT
PADDING CAST COTTON 4X4 STRL (CAST SUPPLIES) ×2
PADDING CAST SYNTHETIC 3 NS LF (CAST SUPPLIES) ×2
PADDING CAST SYNTHETIC 3X4 NS (CAST SUPPLIES) ×1 IMPLANT
PENCIL BUTTON HOLSTER BLD 10FT (ELECTRODE) IMPLANT
RUBBERBAND STERILE (MISCELLANEOUS) ×3 IMPLANT
SCREW GEMINUS PALS 2.5X14 (Screw) ×3 IMPLANT
SCREWDRIVER SURG ST 2 (INSTRUMENTS) IMPLANT
SKELETAL DYNAMICS DVR SET (Set) ×3 IMPLANT
SLING ARM IMMOBILIZER XL (CAST SUPPLIES) ×3 IMPLANT
SUT VIC AB 2-0 CT3 27 (SUTURE) ×3 IMPLANT
SUT VICRYL 4-0 PS2 18IN ABS (SUTURE) IMPLANT
SUT VICRYL RAPIDE 4/0 PS 2 (SUTURE) ×3 IMPLANT
SYR 10ML LL (SYRINGE) ×3 IMPLANT
TOWEL GREEN STERILE FF (TOWEL DISPOSABLE) ×3 IMPLANT
TUBE CONNECTING 12'X1/4 (SUCTIONS) ×1
TUBE CONNECTING 12X1/4 (SUCTIONS) ×2 IMPLANT
UNDERPAD 30X30 (UNDERPADS AND DIAPERS) ×3 IMPLANT
WIRE FIX 1.5 STANDARD TIP (WIRE)
WIRE FIX 1.5 STD TIP (WIRE) IMPLANT

## 2019-06-20 NOTE — Interval H&P Note (Signed)
History and Physical Interval Note:  06/20/2019 7:29 AM  Angel Brewer  has presented today for surgery, with the diagnosis of RIGHT DISTAL RADIUS FRACTURE.  The various methods of treatment have been discussed with the patient and family. After consideration of risks, benefits and other options for treatment, the patient has consented to  Procedure(s) with comments: OPEN REDUCTION INTERNAL FIXATION (ORIF) DISTAL RADIAL FRACTURE (Right) - REGIONAL BLOCK TOTAL SURGERY TIME: 90 MINUTES as a surgical intervention.  The patient's history has been reviewed, patient examined, no change in status, stable for surgery.  I have reviewed the patient's chart and labs.  Questions were answered to the patient's satisfaction.     Jolyn Nap

## 2019-06-20 NOTE — Transfer of Care (Signed)
Immediate Anesthesia Transfer of Care Note  Patient: Asiah Norfolk  Procedure(s) Performed: OPEN REDUCTION INTERNAL FIXATION (ORIF) DISTAL RADIAL FRACTURE (Right )  Patient Location: PACU  Anesthesia Type:MAC and Regional  Level of Consciousness: awake, alert  and oriented  Airway & Oxygen Therapy: Patient Spontanous Breathing, Patient connected to nasal cannula oxygen and patient complains of 10/10 back pain, Dr. Conrad Cushing notified and PACU RN administering IV dilaudid  Post-op Assessment: Report given to RN and Post -op Vital signs reviewed and stable  Post vital signs: Reviewed and stable  Last Vitals:  Vitals Value Taken Time  BP    Temp    Pulse    Resp    SpO2      Last Pain:  Vitals:   06/20/19 0759  TempSrc:   PainSc: 5          Complications: No apparent anesthesia complications

## 2019-06-20 NOTE — Anesthesia Procedure Notes (Signed)
Anesthesia Regional Block: Supraclavicular block   Pre-Anesthetic Checklist: ,, timeout performed, Correct Patient, Correct Site, Correct Laterality, Correct Procedure, Correct Position, site marked, Risks and benefits discussed,  Surgical consent,  Pre-op evaluation,  At surgeon's request and post-op pain management  Laterality: Right  Prep: chloraprep       Needles:   Needle Type: Echogenic Stimulator Needle     Needle Length: 9cm      Additional Needles:   Procedures:, nerve stimulator,,,,,,,   Nerve Stimulator or Paresthesia:  Response: 0.4 mA,   Additional Responses:   Narrative:  Start time: 06/20/2019 9:00 AM End time: 06/20/2019 9:10 AM Injection made incrementally with aspirations every 5 mL.  Performed by: Personally  Anesthesiologist: Lillia Abed, MD  Additional Notes: Monitors applied. Patient sedated. Sterile prep and drape,hand hygiene and sterile gloves were used. Relevant anatomy identified.Needle position confirmed.Local anesthetic injected incrementally after negative aspiration. Local anesthetic spread visualized around nerve(s). Vascular puncture avoided. No complications. Image printed for medical record.The patient tolerated the procedure well.

## 2019-06-20 NOTE — Progress Notes (Addendum)
12.5 mg of meperidine wasted after pt d/c with Gwenlyn Fudge.

## 2019-06-20 NOTE — Anesthesia Procedure Notes (Signed)
Procedure Name: MAC Date/Time: 06/20/2019 9:33 AM Performed by: Babs Bertin, CRNA Pre-anesthesia Checklist: Patient identified, Emergency Drugs available, Suction available, Patient being monitored and Timeout performed Patient Re-evaluated:Patient Re-evaluated prior to induction Oxygen Delivery Method: Simple face mask

## 2019-06-20 NOTE — Discharge Instructions (Signed)
Discharge Instructions   You have a dressing with a plaster splint incorporated in it. Move your fingers as much as possible, making a full fist and fully opening the fist. Elevate your hand to reduce pain & swelling of the digits.  Ice over the operative site may be helpful to reduce pain & swelling.  DO NOT USE HEAT. Pain medicine has been prescribed for you.  Take Tylenol 650 mg and Ibuprofen 600 mg every 6 hours for pain control. Take the pain medicine prescribed additionally as a RESCUE medicine for severe post operative pain. Leave the dressing in place until you return to our office.  You may shower, but keep the bandage clean & dry.  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 Our office will call you to arrange follow-up for 06/30/2019   Please call [phone] during normal business hours or [phone] after hours for any problems. Including the following:  - excessive redness of the incisions - drainage for more than 4 days - fever of more than 101.5 F  *Please note that pain medications will not be refilled after hours or on weekends.  WORK STATUS: Out of work until 1st post operative appointment.

## 2019-06-20 NOTE — Anesthesia Postprocedure Evaluation (Signed)
Anesthesia Post Note  Patient: Angel Brewer  Procedure(s) Performed: OPEN REDUCTION INTERNAL FIXATION (ORIF) DISTAL RADIAL FRACTURE (Right )     Patient location during evaluation: PACU Anesthesia Type: Regional Level of consciousness: awake and alert Pain management: pain level controlled Vital Signs Assessment: post-procedure vital signs reviewed and stable Respiratory status: spontaneous breathing, nonlabored ventilation, respiratory function stable and patient connected to nasal cannula oxygen Cardiovascular status: blood pressure returned to baseline and stable Postop Assessment: no apparent nausea or vomiting Anesthetic complications: no    Last Vitals:  Vitals:   06/20/19 1130 06/20/19 1145  BP: 132/63 110/78  Pulse: (!) 54 62  Resp: 15 16  Temp:    SpO2: 94% 98%    Last Pain:  Vitals:   06/20/19 1130  TempSrc:   PainSc: 4                  [name] DAVID

## 2019-06-20 NOTE — Op Note (Addendum)
06/20/2019  7:29 AM  PATIENT:  Angel Brewer  53 y.o. female  PRE-OPERATIVE DIAGNOSIS:  Comminuted intra-articular right distal radius fracture  POST-OPERATIVE DIAGNOSIS:  Same  PROCEDURE:  ORIF comminuted right intra-articular distal radius fracture (3 frags), 74081  SURGEON: Rayvon Char. Grandville Silos, MD  PHYSICIAN ASSISTANT: Morley Kos, OPA-C  ANESTHESIA:  regional and MAC  SPECIMENS:  None  DRAINS: None  EBL:  less than 50 mL  PREOPERATIVE INDICATIONS:  Angel Brewer is a  53 y.o. female with a comminuted displaced right intra-articular distal radius fracture.  The risks benefits and alternatives were discussed with the patient preoperatively including but not limited to the risks of infection, bleeding, nerve injury, cardiopulmonary complications, the need for revision surgery, among others, and the patient verbalized understanding and consented to proceed.  OPERATIVE IMPLANTS: [employer] Geminus plate/screws/pegs  OPERATIVE PROCEDURE: After receiving prophylactic antibiotics and a regional block, the patient was escorted to the operative theatre and placed in a supine position.   A surgical "time-out" was performed during which the planned procedure, proposed operative site, and the correct patient identity were compared to the operative consent and agreement confirmed by the circulating nurse according to current facility policy. Following application of a tourniquet to the operative extremity, the exposed skin was pre-scrubbed with Hibiclens scrub brush and then was prepped with Chloraprep and draped in the usual sterile fashion. The limb was exsanguinated with an Esmarch bandage and the tourniquet inflated to approximately 180mHg higher than systolic BP.   A sinusoidal-shaped incision was marked and made over the FCR axis and the distal forearm. The skin was incised sharply with scalpel, subcutaneous tissues with blunt and spreading dissection. The FCR axis was  exploited deeply. The pronator quadratus was reflected in an L-shaped ulnarly and the brachioradialis was split in a Z-plasty fashion for later reapproximation. The fracture was inspected and provisionally reduced.  This was confirmed fluoroscopically. The appropriately sized plate was selected and found to fit well. It was placed in its provisional alignment of the radius and this was confirmed fluoroscopically.  It was secured to the radius with a screw through the slotted hole.  Additional adjustments were made as necessary, and the distal holes were all drilled and filled.  Peg/screw length distally was selected on the shorter side of measurements to minimize the risk for dorsal cortical penetration. The remainder of the proximal holes were drilled and filled.   Final images were obtained and the DRUJ was examined for stability. It was found to be sufficiently stable. The wound was then copiously irrigated and the brachioradialis repaired with 2-0 Vicryl Rapide suture followed by repair of the pronator quadratus with the same suture type. Tourniquet was released and additional hemostasis obtained and the skin was closed with 2-0 Vicryl deep dermal buried sutures followed by running 4-0 Vicryl Rapide horizontal mattress suture in the skin. A bulky dressing with a volar plaster component was applied and the patient was taken to the recovery room in stable condition.  DISPOSITION: The patient will be discharged home today with typical post-op instructions, returning on 06-30-19 for reevaluation with new x-rays of the affected wrist out of the splint to include an inclined lateral and then transition to therapy to have a custom splint constructed and begin rehabilitation.  07-12-19 ADDENDUM: OPA-C KMorley Kosprovided necessary assistance with patient positioning, retraction of soft tissues, placement of implants, and closure of the wound

## 2019-06-28 ENCOUNTER — Encounter: Payer: Self-pay | Admitting: *Deleted

## 2019-06-30 ENCOUNTER — Encounter: Payer: Self-pay | Admitting: *Deleted

## 2019-07-11 ENCOUNTER — Emergency Department (HOSPITAL_COMMUNITY)
Admission: EM | Admit: 2019-07-11 | Discharge: 2019-07-11 | Disposition: A | Discharge status: Home / Self Care | Payer: Medicaid Other | Attending: Emergency Medicine | Admitting: Emergency Medicine

## 2019-07-11 ENCOUNTER — Emergency Department (HOSPITAL_BASED_OUTPATIENT_CLINIC_OR_DEPARTMENT_OTHER)
Admission: EM | Admit: 2019-07-11 | Discharge: 2019-07-11 | Disposition: A | Discharge status: Home / Self Care | Payer: Medicaid Other | Source: Home / Self Care | Attending: Emergency Medicine | Admitting: Emergency Medicine

## 2019-07-11 ENCOUNTER — Encounter (HOSPITAL_COMMUNITY): Payer: Self-pay | Admitting: *Deleted

## 2019-07-11 ENCOUNTER — Other Ambulatory Visit: Payer: Self-pay

## 2019-07-11 ENCOUNTER — Emergency Department (HOSPITAL_COMMUNITY): Payer: Medicaid Other

## 2019-07-11 DIAGNOSIS — B372 Candidiasis of skin and nail: Secondary | ICD-10-CM

## 2019-07-11 DIAGNOSIS — I82431 Acute embolism and thrombosis of right popliteal vein: Secondary | ICD-10-CM | POA: Diagnosis not present

## 2019-07-11 DIAGNOSIS — R600 Localized edema: Secondary | ICD-10-CM | POA: Diagnosis not present

## 2019-07-11 DIAGNOSIS — Z87891 Personal history of nicotine dependence: Secondary | ICD-10-CM | POA: Diagnosis not present

## 2019-07-11 DIAGNOSIS — Z79899 Other long term (current) drug therapy: Secondary | ICD-10-CM | POA: Diagnosis not present

## 2019-07-11 DIAGNOSIS — M7989 Other specified soft tissue disorders: Secondary | ICD-10-CM | POA: Diagnosis not present

## 2019-07-11 DIAGNOSIS — R2241 Localized swelling, mass and lump, right lower limb: Secondary | ICD-10-CM | POA: Diagnosis present

## 2019-07-11 LAB — BASIC METABOLIC PANEL
Anion gap: 10 (ref 5–15)
BUN: 13 mg/dL (ref 6–20)
CO2: 26 mmol/L (ref 22–32)
Calcium: 9.2 mg/dL (ref 8.9–10.3)
Chloride: 104 mmol/L (ref 98–111)
Creatinine, Ser: 0.91 mg/dL (ref 0.44–1.00)
GFR calc Af Amer: >60 mL/min (ref >60)
GFR calc non Af Amer: >60 mL/min (ref >60)
Glucose, Bld: 81 mg/dL (ref 70–99)
Potassium: 4.6 mmol/L (ref 3.5–5.1)
Sodium: 140 mmol/L (ref 135–145)

## 2019-07-11 LAB — I-STAT BETA HCG BLOOD, ED (MC, WL, AP ONLY): I-stat hCG, quantitative: <5 m[IU]/mL (ref <5)

## 2019-07-11 LAB — CBC
HCT: 42.5 % (ref 36.0–46.0)
Hemoglobin: 13 g/dL (ref 12.0–15.0)
MCH: 28.4 pg (ref 26.0–34.0)
MCHC: 30.6 g/dL (ref 30.0–36.0)
MCV: 92.8 fL (ref 80.0–100.0)
Platelets: 201 10*3/uL (ref 150–400)
RBC: 4.58 MIL/uL (ref 3.87–5.11)
RDW: 13.9 % (ref 11.5–15.5)
WBC: 6.7 10*3/uL (ref 4.0–10.5)
nRBC: 0.6 % — ABNORMAL HIGH (ref 0.0–0.2)

## 2019-07-11 MED ORDER — NYSTATIN 100000 UNIT/GM EX POWD: 1.0000 "application " | Freq: Three times a day (TID) | CUTANEOUS | 0 refills | Status: DC

## 2019-07-11 MED ORDER — RIVAROXABAN 15 MG PO TABS
15.0000 mg | ORAL_TABLET | Freq: Once | ORAL | Status: AC
  Administered 2019-07-11: 15 mg via ORAL
  Filled 2019-07-11 (×2): qty 1

## 2019-07-11 MED ORDER — RIVAROXABAN (XARELTO) VTE STARTER PACK (15 & 20 MG): ORAL_TABLET | ORAL | 0 refills | Status: DC

## 2019-07-11 NOTE — ED Provider Notes (Signed)
[facility] Provider Note   CSN: [id number] Arrival date & time: 07/11/19  1039   History Chief Complaint  Patient presents with  . Leg Swelling   Angel Brewer is a 53 y.o. female with past medical history significant for DVT, obesity, peripheral vascular disease presents for evaluation of right lower extremity swelling and pain.  Patient history of PE and DVT felt likely due to immobilization in 2017.  She was DC'd home on Coumadin.  Negative hypercoagulable work-up.  She has not been on anticoagulation recently.  She did have surgery 3 weeks ago from Dr. Grandville Silos on right wrist and has had increased immobilization since then.  No recent injuries or falls.  She denies fever, chills, nausea, vomiting, chest pain, shortness of breath, hemoptysis, paresthesias, rashes or lesions.  Denies additional aggravating or alleviating factors.  Rates her pain by 4/10.  History obtained from patient and  past medical records.  No interpreter is used.  HPI     Past Medical History:  Diagnosis Date  . DVT (deep vein thrombosis) in pregnancy    RLE DVT 02/2016  . Dyspnea   . Morbid obesity ([place])   . PE (pulmonary embolism) 02/29/2016  . Peripheral vascular disease ([place])   . Sleep apnea    uses a cpap    Patient Active Problem List   Diagnosis Date Noted  . Right wrist fracture 06/14/2019  . Chronic respiratory failure with hypoxia ([place]) 06/13/2019  . Essential hypertension 08/25/2017  . OSA on CPAP 02/03/2017  . Morbid obesity ([place]) 10/29/2016  . Vitamin D deficiency 10/29/2016    Past Surgical History:  Procedure Laterality Date  . CESAREAN SECTION     x 3  . OPEN REDUCTION INTERNAL FIXATION (ORIF) DISTAL RADIAL FRACTURE Right 06/20/2019   Procedure: OPEN REDUCTION INTERNAL FIXATION (ORIF) DISTAL RADIAL FRACTURE;  Surgeon: [name], MD;  Location: [facility];  Service: Orthopedics;  Laterality: Right;  REGIONAL BLOCK TOTAL SURGERY TIME: 60  MINUTES     OB History   No obstetric history on file.     No family history on file.  Social History   Tobacco Use  . Smoking status: Former Research scientist (life sciences)  . Smokeless tobacco: Never Used  . Tobacco comment: smoked only as a teenager  Substance Use Topics  . Alcohol use: No  . Drug use: No    Home Medications Prior to Admission medications   Medication Sig Start Date End Date Taking? Authorizing Provider  acetaminophen (TYLENOL) 325 MG tablet Take 2 tablets (650 mg total) by mouth every 6 (six) hours. 06/20/19   [name], MD  albuterol (VENTOLIN HFA) 108 (90 Base) MCG/ACT inhaler Inhale 2 puffs into the lungs every 6 (six) hours as needed for wheezing or shortness of breath. 03/22/19   Charlott Rakes, MD  diclofenac Sodium (VOLTAREN) 1 % GEL Apply 1 application topically 4 (four) times daily as needed (knee pain).    [provider]  ibuprofen (ADVIL) 200 MG tablet Take 3 tablets (600 mg total) by mouth every 6 (six) hours. 06/20/19   [name], MD  nystatin (MYCOSTATIN/NYSTOP) powder Apply 1 application topically 3 (three) times daily. 07/11/19   Tavio Biegel A, PA-C  oxyCODONE (ROXICODONE) 5 MG immediate release tablet Take 1 tablet (5 mg total) by mouth every 6 (six) hours as needed for severe pain. 06/20/19   [name], MD  Rivaroxaban 15 & 20 MG TBPK Follow package directions: Take one 6m tablet by mouth twice  a day. On day 22, switch to one 36m tablet once a day. Take with food. 07/11/19   Vora Clover A, PA-C    Allergies    Patient has no known allergies.  Review of Systems   Review of Systems  Constitutional: Negative.   HENT: Negative.   Respiratory: Negative.   Cardiovascular: Negative.   Genitourinary: Negative.   Musculoskeletal:       Right calf pain  Skin: Negative.   Neurological: Negative.   All other systems reviewed and are negative.  Physical Exam Updated Vital Signs BP 123/90 (BP Location: Left Wrist)   Pulse 72    Temp 97.8 F (36.6 C) (Oral)   Resp 18   Wt (!) 186 kg   LMP 11/29/2015   SpO2 92%   BMI 62.34 kg/m   Physical Exam Vitals and nursing note reviewed.  Constitutional:      General: She is not in acute distress.    Appearance: She is well-developed. She is obese. She is not ill-appearing or toxic-appearing.  HENT:     Head: Normocephalic and atraumatic.     Nose: Nose normal.     Mouth/Throat:     Mouth: Mucous membranes are moist.  Eyes:     Pupils: Pupils are equal, round, and reactive to light.  Cardiovascular:     Rate and Rhythm: Normal rate.     Pulses: Normal pulses.          Dorsalis pedis pulses are 2+ on the right side and 2+ on the left side.       Posterior tibial pulses are 2+ on the right side and 2+ on the left side.     Heart sounds: Normal heart sounds.  Pulmonary:     Effort: Pulmonary effort is normal. No respiratory distress.  Abdominal:     General: Bowel sounds are normal. There is no distension.     Tenderness: There is no abdominal tenderness.     Comments:  Mild erythema under abdominal pannus consistent with yeast infection.  No fluctuance, induration.  Musculoskeletal:        General: Normal range of motion.     Cervical back: Normal range of motion.     Comments: Use all 4 extremities without difficulty.  Exam difficult secondary to patient's body habitus.  She does have some right lower extremity swelling, localized to her calf with tenderness to her popliteal fossa.  No fluctuance, induration.  She does have some mild surrounding erythema however no sharp demarcation.  She has lower extremity peripheral arterial disease skin changes.  No bony tenderness to tibia/fibula, knee, femur.  Feet:     Right foot:     Skin integrity: Skin integrity normal.     Left foot:     Skin integrity: Skin integrity normal.  Skin:    General: Skin is warm and dry.     Capillary Refill: Capillary refill takes less than 2 seconds.     Comments: Edema and erythema  to right calf.  No crepitus, step-offs.  No fluctuance, induration, evidence of cellulitis. PAD skin changes.  Mild erythema under abdominal pannus consistent with yeast.  Neurological:     Mental Status: She is alert.     Comments: Intact sensation to sharp and dull.    ED Results / Procedures / Treatments   Labs (all labs ordered are listed, but only abnormal results are displayed) Labs Reviewed  CBC - Abnormal; Notable for the following components:  Result Value   nRBC 0.6 (*)    All other components within normal limits  BASIC METABOLIC PANEL  I-STAT BETA HCG BLOOD, ED ([facility], [facility], [facility] ONLY)    EKG None  Radiology DG Tibia/Fibula Right  Result Date: 07/11/2019 CLINICAL DATA:  Pain and swelling in right leg EXAM: RIGHT TIBIA AND FIBULA - 2 VIEW COMPARISON:  None. FINDINGS: No fracture or dislocation of the right tibia or fibula. Mild arthrosis of the included knee and ankle joints. Diffuse soft tissue edema about the included leg. IMPRESSION: No fracture or dislocation of the right tibia or fibula. Diffuse soft tissue edema about the included leg. Electronically Signed   By: Eddie Candle M.D.   On: 07/11/2019 14:20   VAS [place] LOWER EXTREMITY VENOUS (DVT) ([facility] 7a-7p)  Result Date: 07/11/2019  Lower Venous Study Indications: Swelling.  Risk Factors: DVT. Limitations: Body habitus, poor ultrasound/tissue interface and patient pain tolerance. Comparison Study: 06/13/2019 - Negative for DVT. Performing Technologist: Oliver Hum RVT  Examination Guidelines: A complete evaluation includes B-mode imaging, spectral Doppler, color Doppler, and power Doppler as needed of all accessible portions of each vessel. Bilateral testing is considered an integral part of a complete examination. Limited examinations for reoccurring indications may be performed as noted.  +---------+---------------+---------+-----------+----------+--------------+ RIGHT     CompressibilityPhasicitySpontaneityPropertiesThrombus Aging +---------+---------------+---------+-----------+----------+--------------+ CFV                     Yes      Yes                                 +---------+---------------+---------+-----------+----------+--------------+ SFJ      Full                                                        +---------+---------------+---------+-----------+----------+--------------+ FV Prox  Full                                                        +---------+---------------+---------+-----------+----------+--------------+ FV Mid                  Yes      Yes                                 +---------+---------------+---------+-----------+----------+--------------+ FV Distal               Yes      Yes                                 +---------+---------------+---------+-----------+----------+--------------+ POP      None           No       No                   Acute          +---------+---------------+---------+-----------+----------+--------------+ PTV  Not visualized +---------+---------------+---------+-----------+----------+--------------+ PERO                                                  Not visualized +---------+---------------+---------+-----------+----------+--------------+   +----+---------------+---------+-----------+----------+--------------+ LEFTCompressibilityPhasicitySpontaneityPropertiesThrombus Aging +----+---------------+---------+-----------+----------+--------------+ CFV Full           Yes      Yes                                 +----+---------------+---------+-----------+----------+--------------+     Summary: Right: Findings consistent with acute deep vein thrombosis involving the right popliteal vein. No cystic structure found in the popliteal fossa. Left: No evidence of common femoral vein obstruction.  *See table(s) above  for measurements and observations.    Preliminary     Procedures Procedures (including critical care time)  Medications Ordered in ED Medications  Rivaroxaban (XARELTO) tablet 15 mg (has no administration in time range)    ED Course  I have reviewed the triage vital signs and the nursing notes.  Pertinent labs & imaging results that were available during my care of the patient were reviewed by me and considered in my medical decision making (see chart for details).  53 year old presents for evaluation of right lower extremity swelling over the last 4 days.  Has noticed some swelling to her right calf, pain to her right popliteal fossa as well as some erythema.  Has history of PE, DVT in 2017 which is thought to be caused due to an extended drive.  She did recently have surgery approximately 2 weeks ago and has been immobile due to surgery.  He is afebrile, nonseptic, not ill-appearing.  She denies chest pain, shortness of breath or hemoptysis.  She is without tachycardia, tachypnea or hypoxia.  She does have some erythema and skin changes underneath her abdominal pannus consistent with yeast infection.  We will do some topical nystatin powder.  No fluctuance, induration, abdominal wall tenderness to suggest abscess or cellulitis.  Heart and lungs clear.  Nonfocal neuro exam without deficits.  Compartments soft.  Ultrasound positive for DVT to right popliteal DVT.  Plain film with soft tissue swelling however no bony deformity, gas-forming organism.  No evidence of abscess or cellulitis on exam.  Labs without significant findings.  Was previously on Coumadin due to lack of insurance with prior DVT in 2017 however she now has Medicaid.  Will start on Xarelto.  No history of intracranial hemorrhages, GI bleeds.  Continues to deny chest pain, shortness of breath.  She is without tachycardia, tachypnea or hypoxia.  First dose given in the emergency department.  Pharmacy has given patient coupon and talk  to her about her medicines.  She does have a PCP.  Discussed strict return precautions with patient.  Patient voiced understanding is agreeable for follow-up.  Ambulatory oxygen saturation greater than 90% on room air.  Low suspicion for PE given her lack of chest pain, shortness of breath and stable vital signs.  The patient has been appropriately medically screened and/or stabilized in the ED. I have low suspicion for any other emergent medical condition which would require further screening, evaluation or treatment in the ED or require inpatient management.  Patient is hemodynamically stable and in no acute distress.  Patient able to ambulate in department prior to ED.  Evaluation does not show acute pathology that would require ongoing or additional emergent interventions while in the emergency department or further inpatient treatment.  I have discussed the diagnosis with the patient and answered all questions.  Pain is been managed while in the emergency department and patient has no further complaints prior to discharge.  Patient is comfortable with plan discussed in room and is stable for discharge at this time.  I have discussed strict return precautions for returning to the emergency department.  Patient was encouraged to follow-up with PCP/specialist refer to at discharge.    MDM Rules/Calculators/A&P                       Final Clinical Impression(s) / ED Diagnoses Final diagnoses:  Acute deep vein thrombosis (DVT) of popliteal vein of right lower extremity (HCC)  Yeast infection of the skin    Rx / DC Orders ED Discharge Orders         Ordered    Rivaroxaban 15 & 20 MG TBPK     07/11/19 1444    nystatin (MYCOSTATIN/NYSTOP) powder  3 times daily     07/11/19 1507           Carmesha Morocco A, PA-C 07/11/19 1508    Tegeler, Gwenyth Allegra, MD 07/11/19 [phone]

## 2019-07-11 NOTE — ED Triage Notes (Signed)
Pt is here for swelling in right leg.  Pt states that she feels like she has a knot in the back of her leg.  Pt denies any injury.  Pt has HX of DVT and PE

## 2019-07-11 NOTE — Progress Notes (Signed)
Right lower extremity venous duplex has been completed. Preliminary results can be found in CV Proc through chart review.  Results were given to [facility] PA.  07/11/19 2:01 PM Carlos Levering RVT

## 2019-07-11 NOTE — Discharge Instructions (Addendum)
Information on my medicine - XARELTO (rivaroxaban)  This medication education was reviewed with me or my healthcare representative as part of my discharge preparation.   WHY WAS XARELTO PRESCRIBED FOR YOU? Xarelto was prescribed to treat blood clots that may have been found in the veins of your legs (deep vein thrombosis) or in your lungs (pulmonary embolism) and to reduce the risk of them occurring again.  What do you need to know about Xarelto? The starting dose is one 15 mg tablet taken TWICE daily with food for the FIRST 21 DAYS then on (enter date)  1/19  the dose is changed to one 20 mg tablet taken ONCE A DAY with your evening meal.  DO NOT stop taking Xarelto without talking to the health care provider who prescribed the medication.  Refill your prescription for 20 mg tablets before you run out.  After discharge, you should have regular check-up appointments with your healthcare provider that is prescribing your Xarelto.  In the future your dose may need to be changed if your kidney function changes by a significant amount.  What do you do if you miss a dose? If you are taking Xarelto TWICE DAILY and you miss a dose, take it as soon as you remember. You may take two 15 mg tablets (total 30 mg) at the same time then resume your regularly scheduled 15 mg twice daily the next day.  If you are taking Xarelto ONCE DAILY and you miss a dose, take it as soon as you remember on the same day then continue your regularly scheduled once daily regimen the next day. Do not take two doses of Xarelto at the same time.   Important Safety Information Xarelto is a blood thinner medicine that can cause bleeding. You should call your healthcare provider right away if you experience any of the following: ? Bleeding from an injury or your nose that does not stop. ? Unusual colored urine (red or dark brown) or unusual colored stools (red or black). ? Unusual bruising for unknown reasons. ? A  serious fall or if you hit your head (even if there is no bleeding).  Some medicines may interact with Xarelto and might increase your risk of bleeding while on Xarelto. To help avoid this, consult your healthcare provider or pharmacist prior to using any new prescription or non-prescription medications, including herbals, vitamins, non-steroidal anti-inflammatory drugs (NSAIDs) and supplements.  This website has more information on Xarelto: [email or web address]

## 2019-07-12 ENCOUNTER — Ambulatory Visit: Payer: Medicaid Other | Attending: Nurse Practitioner | Admitting: Nurse Practitioner

## 2019-07-12 ENCOUNTER — Encounter: Payer: Self-pay | Admitting: Nurse Practitioner

## 2019-07-12 DIAGNOSIS — I82401 Acute embolism and thrombosis of unspecified deep veins of right lower extremity: Secondary | ICD-10-CM | POA: Diagnosis not present

## 2019-07-12 DIAGNOSIS — Z86718 Personal history of other venous thrombosis and embolism: Secondary | ICD-10-CM | POA: Diagnosis not present

## 2019-07-12 DIAGNOSIS — G4730 Sleep apnea, unspecified: Secondary | ICD-10-CM | POA: Insufficient documentation

## 2019-07-12 DIAGNOSIS — Z7901 Long term (current) use of anticoagulants: Secondary | ICD-10-CM | POA: Insufficient documentation

## 2019-07-12 DIAGNOSIS — Z6841 Body Mass Index (BMI) 40.0 and over, adult: Secondary | ICD-10-CM | POA: Insufficient documentation

## 2019-07-12 DIAGNOSIS — I739 Peripheral vascular disease, unspecified: Secondary | ICD-10-CM | POA: Diagnosis not present

## 2019-07-12 DIAGNOSIS — Z86711 Personal history of pulmonary embolism: Secondary | ICD-10-CM | POA: Diagnosis not present

## 2019-07-12 DIAGNOSIS — Z87891 Personal history of nicotine dependence: Secondary | ICD-10-CM | POA: Insufficient documentation

## 2019-07-12 MED ORDER — RIVAROXABAN 20 MG PO TABS: 20.0000 mg | ORAL_TABLET | Freq: Every day | ORAL | 1 refills | Status: DC

## 2019-07-12 NOTE — Progress Notes (Signed)
Virtual Visit via Telephone Note Due to national recommendations of social distancing due to Angel Brewer, telehealth visit is felt to be most appropriate for this patient at this time.  I discussed the limitations, risks, security and privacy concerns of performing an evaluation and management service by telephone and the availability of in person appointments. I also discussed with the patient that there may be a patient responsible charge related to this service. The patient expressed understanding and agreed to proceed.    I connected with [place] on 07/12/19  at   1:30 PM EST  EDT by telephone and verified that I am speaking with the correct person using two identifiers.   Consent I discussed the limitations, risks, security and privacy concerns of performing an evaluation and management service by telephone and the availability of in person appointments. I also discussed with the patient that there may be a patient responsible charge related to this service. The patient expressed understanding and agreed to proceed.   Location of Patient: Private  Residence   Location of Provider: [place] and [place] participating in Telemedicine visit: [name] FNP-BC [place]    History of Present Illness: Telemedicine visit for: DVT   She has a history of RLE DVT and PE 2017. Now with recurrent RLE DVT. Was on coumadin and seen by Oncology. Hypercoag panels have been negative. Per oncology in 2018: it was felt that her DVT and PE were PROVOKED and related to her long car ride to [place] and her morbid obesity.  As she now has been diagnosed with another RLE DVT and started on xarelto, I will refer to hematology for further recommendations regarding any extension of xarelto required after 3 months.   She understands she is to continue on xarelto until further notice. Denies chest pain, shortness of breath, palpitations, lightheadedness,  dizziness, headaches or LLE edema.     Past Medical History:  Diagnosis Date  . DVT (deep vein thrombosis) in pregnancy    RLE DVT 02/2016  . Dyspnea   . Morbid obesity ([place])   . PE (pulmonary embolism) 02/29/2016  . Peripheral vascular disease ([place])   . Sleep apnea    uses a cpap    Past Surgical History:  Procedure Laterality Date  . CESAREAN SECTION     x 3  . OPEN REDUCTION INTERNAL FIXATION (ORIF) DISTAL RADIAL FRACTURE Right 06/20/2019   Procedure: OPEN REDUCTION INTERNAL FIXATION (ORIF) DISTAL RADIAL FRACTURE;  Surgeon: [name], MD;  Location: [facility];  Service: Orthopedics;  Laterality: Right;  REGIONAL BLOCK TOTAL SURGERY TIME: 90 MINUTES    History reviewed. No pertinent family history.  Social History   Socioeconomic History  . Marital status: Single    Spouse name: Not on file  . Number of children: Not on file  . Years of education: Not on file  . Highest education level: Not on file  Occupational History  . Not on file  Tobacco Use  . Smoking status: Former Research scientist (life sciences)  . Smokeless tobacco: Never Used  . Tobacco comment: smoked only as a teenager  Substance and Sexual Activity  . Alcohol use: No  . Drug use: No  . Sexual activity: Not on file  Other Topics Concern  . Not on file  Social History Narrative  . Not on file   Social Determinants of Health   Financial Resource Strain:   . Difficulty of Paying Living Expenses: Not on file  Food Insecurity:   . Worried About Charity fundraiser in the Last Year: Not on file  . Ran Out of Food in the Last Year: Not on file  Transportation Needs:   . Lack of Transportation (Medical): Not on file  . Lack of Transportation (Non-Medical): Not on file  Physical Activity:   . Days of Exercise per Week: Not on file  . Minutes of Exercise per Session: Not on file  Stress:   . Feeling of Stress : Not on file  Social Connections:   . Frequency of Communication with Friends and Family: Not on file  . Frequency  of Social Gatherings with Friends and Family: Not on file  . Attends Religious Services: Not on file  . Active Member of Clubs or Organizations: Not on file  . Attends Archivist Meetings: Not on file  . Marital Status: Not on file     Observations/Objective: Awake, alert and oriented x 3   Review of Systems  Constitutional: Negative for fever, malaise/fatigue and weight loss.  HENT: Negative.  Negative for nosebleeds.   Eyes: Negative.  Negative for blurred vision, double vision and photophobia.  Respiratory: Negative.  Negative for cough and shortness of breath.   Cardiovascular: Positive for leg swelling. Negative for chest pain and palpitations.  Gastrointestinal: Negative.  Negative for heartburn, nausea and vomiting.  Musculoskeletal: Negative.  Negative for myalgias.  Neurological: Negative.  Negative for dizziness, focal weakness, seizures and headaches.  Psychiatric/Behavioral: Negative.  Negative for suicidal ideas.    Assessment and Plan: Anjenette was seen today for hospitalization follow-up.  Diagnoses and all orders for this visit:  Recurrent acute deep vein thrombosis (DVT) of right lower extremity ([place]) -     Ambulatory referral to Hematology -     rivaroxaban (XARELTO) 20 MG TABS tablet; Take 1 tablet (20 mg total) by mouth daily with supper. Discussed diet and exercise for person with BMI >62.   Instructed: You must burn more calories than you eat. Losing 5 percent of your body weight should be considered a success. In the longer term, losing more than 15 percent of your body weight and staying at this weight is an extremely good result. However, keep in mind that even losing 5 percent of your body weight leads to important health benefits, so try not to get discouraged if you're not able to lose more than this. Will recheck weight in 3 months.   Follow Up Instructions Return in about 3 months (around 10/10/2019).     I discussed the assessment and  treatment plan with the patient. The patient was provided an opportunity to ask questions and all were answered. The patient agreed with the plan and demonstrated an understanding of the instructions.   The patient was advised to call back or seek an in-person evaluation if the symptoms worsen or if the condition fails to improve as anticipated.  I provided 17 minutes of non-face-to-face time during this encounter including median intraservice time, reviewing previous notes, labs, imaging, medications and explaining diagnosis and management.  [name], FNP-BC

## 2019-07-13 ENCOUNTER — Telehealth: Payer: Self-pay | Admitting: Hematology and Oncology

## 2019-07-13 NOTE — Telephone Encounter (Signed)
Received a new hem referral from [name], NP for recurrent dvt. Ms. Angel Brewer has been cld and scheduled to see Dr. Lorenso Courier on 1/18 at 9am. Letter mailed.

## 2019-07-16 ENCOUNTER — Encounter (HOSPITAL_COMMUNITY): Payer: Self-pay | Admitting: Emergency Medicine

## 2019-07-16 ENCOUNTER — Emergency Department (HOSPITAL_COMMUNITY)
Admission: EM | Admit: 2019-07-16 | Discharge: 2019-07-16 | Disposition: A | Discharge status: Home / Self Care | Payer: Medicaid Other | Attending: Emergency Medicine | Admitting: Emergency Medicine

## 2019-07-16 ENCOUNTER — Other Ambulatory Visit: Payer: Self-pay

## 2019-07-16 ENCOUNTER — Emergency Department (HOSPITAL_BASED_OUTPATIENT_CLINIC_OR_DEPARTMENT_OTHER): Payer: Medicaid Other

## 2019-07-16 DIAGNOSIS — Z87891 Personal history of nicotine dependence: Secondary | ICD-10-CM | POA: Diagnosis not present

## 2019-07-16 DIAGNOSIS — I82401 Acute embolism and thrombosis of unspecified deep veins of right lower extremity: Secondary | ICD-10-CM | POA: Diagnosis not present

## 2019-07-16 DIAGNOSIS — I10 Essential (primary) hypertension: Secondary | ICD-10-CM | POA: Diagnosis not present

## 2019-07-16 DIAGNOSIS — Z79899 Other long term (current) drug therapy: Secondary | ICD-10-CM | POA: Insufficient documentation

## 2019-07-16 DIAGNOSIS — Z86718 Personal history of other venous thrombosis and embolism: Secondary | ICD-10-CM

## 2019-07-16 DIAGNOSIS — I82431 Acute embolism and thrombosis of right popliteal vein: Secondary | ICD-10-CM | POA: Insufficient documentation

## 2019-07-16 DIAGNOSIS — M79609 Pain in unspecified limb: Secondary | ICD-10-CM

## 2019-07-16 DIAGNOSIS — Z09 Encounter for follow-up examination after completed treatment for conditions other than malignant neoplasm: Secondary | ICD-10-CM

## 2019-07-16 NOTE — ED Provider Notes (Signed)
[facility] EMERGENCY DEPARTMENT Provider Note   CSN: [id number] Arrival date & time: 07/16/19  1329     History Chief Complaint  Patient presents with  . dvt    worsening leg pain    Angel Brewer is a 54 y.o. female.  HPI Patient got diagnosed with a DVT of the right lower extremity 12\28 showing acute DVT of the right popliteal vein.  Patient presents today stating she has increasing swelling and pain and tightness that has now migrated above her knee.  She reports she has been elevating and taking her Xarelto as prescribed.  She reports last dose was this morning at approximately 8 AM.  She will be due for her next daily dose at 8 PM.  She denies numbness or tingling but notes her is pain and tightness now in the upper leg as well.  Patient reports she has been chronically short of breath for months and had a lot of tests done but does not have a specific etiology for that.  She does not feel it any different than what she is experienced for months.  She denies fever or productive cough.  Denies any chest pain.    Past Medical History:  Diagnosis Date  . DVT (deep vein thrombosis) in pregnancy    RLE DVT 02/2016  . Dyspnea   . Morbid obesity ([place])   . PE (pulmonary embolism) 02/29/2016  . Peripheral vascular disease ([place])   . Sleep apnea    uses a cpap    Patient Active Problem List   Diagnosis Date Noted  . Right wrist fracture 06/14/2019  . Chronic respiratory failure with hypoxia ([place]) 06/13/2019  . Essential hypertension 08/25/2017  . OSA on CPAP 02/03/2017  . Morbid obesity ([place]) 10/29/2016  . Vitamin D deficiency 10/29/2016    Past Surgical History:  Procedure Laterality Date  . CESAREAN SECTION     x 3  . OPEN REDUCTION INTERNAL FIXATION (ORIF) DISTAL RADIAL FRACTURE Right 06/20/2019   Procedure: OPEN REDUCTION INTERNAL FIXATION (ORIF) DISTAL RADIAL FRACTURE;  Surgeon: [name], MD;  Location: [facility];  Service: Orthopedics;  Laterality:  Right;  REGIONAL BLOCK TOTAL SURGERY TIME: 80 MINUTES     OB History   No obstetric history on file.     No family history on file.  Social History   Tobacco Use  . Smoking status: Former Research scientist (life sciences)  . Smokeless tobacco: Never Used  . Tobacco comment: smoked only as a teenager  Substance Use Topics  . Alcohol use: No  . Drug use: No    Home Medications Prior to Admission medications   Medication Sig Start Date End Date Taking? Authorizing Provider  acetaminophen (TYLENOL) 325 MG tablet Take 2 tablets (650 mg total) by mouth every 6 (six) hours. 06/20/19   [name], MD  albuterol (VENTOLIN HFA) 108 (90 Base) MCG/ACT inhaler Inhale 2 puffs into the lungs every 6 (six) hours as needed for wheezing or shortness of breath. 03/22/19   Charlott Rakes, MD  diclofenac Sodium (VOLTAREN) 1 % GEL Apply 1 application topically 4 (four) times daily as needed (knee pain).    [provider]  ibuprofen (ADVIL) 200 MG tablet Take 3 tablets (600 mg total) by mouth every 6 (six) hours. 06/20/19   [name], MD  nystatin (MYCOSTATIN/NYSTOP) powder Apply 1 application topically 3 (three) times daily. 07/11/19   Henderly, Britni A, PA-C  oxyCODONE (ROXICODONE) 5 MG immediate release tablet Take 1 tablet (5 mg total)  by mouth every 6 (six) hours as needed for severe pain. 06/20/19   [name], MD  rivaroxaban (XARELTO) 20 MG TABS tablet Take 1 tablet (20 mg total) by mouth daily with supper. 08/11/19 09/10/19  [name], NP  Rivaroxaban 15 & 20 MG TBPK Follow package directions: Take one 49m tablet by mouth twice a day. On day 22, switch to one 271mtablet once a day. Take with food. 07/11/19   Henderly, Britni A, PA-C    Allergies    Patient has no known allergies.  Review of Systems   Review of Systems 10 Systems reviewed and are negative for acute change except as noted in the HPI.  Physical Exam Updated Vital Signs BP (!) 149/79 (BP Location: Left Wrist)   Pulse 89    Temp 97.9 F (36.6 C) (Oral)   Resp (!) 26   LMP 11/29/2015   SpO2 100%   Physical Exam Constitutional:      Comments: Patient is alert and nontoxic.  She is ambulatory into the emergency department.  Appearance of mild respiratory distress with normal activities of dressing and transitioning to stretcher.  (Patient reports this is typical for her)  HENT:     Head: Normocephalic and atraumatic.  Eyes:     Extraocular Movements: Extraocular movements intact.  Cardiovascular:     Rate and Rhythm: Normal rate and regular rhythm.  Pulmonary:     Comments: Mild increased work of breathing at rest.  Occasional expiratory wheeze in the lung fields. Abdominal:     General: There is no distension.     Palpations: Abdomen is soft.     Tenderness: There is no abdominal tenderness. There is no guarding.  Musculoskeletal:        General: Swelling and tenderness present.     Comments: Right lower extremity has 2+ edema with tenderness to palpation of the calf.  No wounds the dorsalis pedis pulses are 2+ and symmetric on the left and right feet.  The feet are warm and dry to touch.  Patient endorses tenderness to the medial upper thigh on the right.  See attached images.  Skin:    General: Skin is warm and dry.  Neurological:     General: No focal deficit present.     Mental Status: She is oriented to person, place, and time.     Coordination: Coordination normal.  Psychiatric:        Mood and Affect: Mood normal.          ED Results / Procedures / Treatments   Labs (all labs ordered are listed, but only abnormal results are displayed) Labs Reviewed - No data to display  EKG None  Radiology No results found.  Procedures Procedures (including critical care time)  Medications Ordered in ED Medications - No data to display  ED Course  I have reviewed the triage vital signs and the nursing notes.  Pertinent labs & imaging results that were available during my care of the  patient were reviewed by me and considered in my medical decision making (see chart for details).    MDM Rules/Calculators/A&P                     Was diagnosed with a DVT of the right lower extremity 5 days ago.  Patient has increased pain and swelling with pain and swelling now noted to be in her medial thigh.  Patient denies any increase in baseline shortness of breath.  She has 100% oxygen on room air.  He denies any chest pain.  Ultrasound was repeated to determine if there has been any significant extension of clot despite treatment.  Ultrasound does not show clot propagation to the upper leg.  This was reviewed with the patient.  Information given regarding following up with vascular specialist for management of DVT and assessment for any benefit from interventional treatment to reduce long-term risks of venous stasis and swelling.  Patient denies need for any additional pain medications.  She will continue with elevating when at rest and strict compliance with Xarelto.  Return precautions reviewed. Final Clinical Impression(s) / ED Diagnoses Final diagnoses:  Encounter for follow-up of acute deep vein thrombosis (DVT) of right lower extremity    Rx / DC Orders ED Discharge Orders    None       Charlesetta Shanks, MD 07/16/19 1624

## 2019-07-16 NOTE — Progress Notes (Signed)
VASCULAR LAB PRELIMINARY  PRELIMINARY  PRELIMINARY  PRELIMINARY  Right lower extremity venous duplex completed.    Preliminary report:  See CV proc for preliminary results.  Gave Dr. Johnney Killian report  Mauro Kaufmann, Mabel Unrein, RVT 07/16/2019, 8:07 PM

## 2019-07-16 NOTE — ED Triage Notes (Signed)
Pt reports diagnosed with dvt in R calf a few days ago and placed on blood thinner, states that 1-2 days ago her thigh began feeling tight and painful as well. Reports she is taking blood thinner as prescribed.

## 2019-07-16 NOTE — Discharge Instructions (Addendum)
1.  Your repeat ultrasound today did not show extension of your blood clot to higher in the leg.  Continue to elevate your leg and take your Xarelto.  Be sure to take your Xarelto at regular time intervals and do not miss any doses. 2.  Schedule an appointment as soon as possible with Dr. Harrell Gave Dickson's office.  This is a vascular specialist who treats problems with veins and clots.  You should get an appointment as soon as possible to see if you would benefit from additional treatment besides the Xarelto.  Sometimes, clot retrieval and removal is possible.  The reason to consider this treatment is so that you have fewer long-term problems with swelling and pain as a result of having had a blood clot. 2.  Return to the emergency department immediately if you develop chest pain, worsening shortness of breath, sudden lightheadedness or other concerning symptoms.

## 2019-07-29 NOTE — Progress Notes (Deleted)
[place] [phone]   Fax:([phone]) [place] NOTE  Patient Care Team: [name], NP as PCP - General (Nurse Practitioner)  Hematological/Oncological History # Recurrent VTEs 1) 02/29/2016: presented to the ED with shortness of breath. CT PE study showed bilateral pulmonary emboli most prominent in the right lower lobe with a moderate burden.  2) 03/01/2016: LE US showed findings consistent with subacute deep vein thrombosis involving the popliteal vein of the right lower extremity. Started on coumadin therapy due to financial issues (could not afford DOAC).  3) 07/11/2019:LE US showed findings consistent with acute deep vein thrombosis involving the right popliteal vein. Started on Xarelto therapy.  4) 07/16/2019: Returned to ED with worsening pain. Repeat LE US showed findings consistent with acute deep vein thrombosis involving the right popliteal vein, and right gastrocnemius veins. Findings suggest new clot progression as compared to previous examination. Continued Xarelto.  08/01/2019: establish care with Dr. Lorenso Courier   CHIEF COMPLAINTS/PURPOSE OF CONSULTATION:  Recurrent VTEs  HISTORY OF PRESENTING ILLNESS:  Angel Brewer 54 y.o. female with medical history significant for OSA on CPAP, morbid obesity, and PVD who presents for evaluation of recurrent VTEs.   On review of the previous records Angel Brewer was initially diagnosed with a CT PE on 02/29/2016 or presenting to [facility] with shortness of breath.  Subsequent lower extremity ultrasound on 03/01/2016 showed subacute deep vein thrombosis involving the popliteal vein of the right lower extremity.  The patient was started on Coumadin therapy at that point due to financial issues.  ***.  Subsequently on 07/11/2019 the patient presented to the emergency department with lower extremity edema and pain.  She underwent an ultrasound which showed acute deep vein thrombosis involving the  right popliteal vein.  She was started on Xarelto therapy that time.  On 07/16/2019 she returned to the ED with worsening pain and swelling which had reached up to her thigh.  Repeat lower extremity ultrasound showed finding consistent with acute vein thrombosis involving the right popliteal vein and right gastrocnemius veins the findings were suggestive of a new clot progression as compared to the previous exam.  The patient was continued on Xarelto therapy and referred to hematology for further evaluation management  On exam today ***  MEDICAL HISTORY:  Past Medical History:  Diagnosis Date  . DVT (deep vein thrombosis) in pregnancy    RLE DVT 02/2016  . Dyspnea   . Morbid obesity ([place])   . PE (pulmonary embolism) 02/29/2016  . Peripheral vascular disease ([place])   . Sleep apnea    uses a cpap    SURGICAL HISTORY: Past Surgical History:  Procedure Laterality Date  . CESAREAN SECTION     x 3  . OPEN REDUCTION INTERNAL FIXATION (ORIF) DISTAL RADIAL FRACTURE Right 06/20/2019   Procedure: OPEN REDUCTION INTERNAL FIXATION (ORIF) DISTAL RADIAL FRACTURE;  Surgeon: [name], MD;  Location: [facility];  Service: Orthopedics;  Laterality: Right;  REGIONAL BLOCK TOTAL SURGERY TIME: 90 MINUTES    SOCIAL HISTORY: Social History   Socioeconomic History  . Marital status: Single    Spouse name: Not on file  . Number of children: Not on file  . Years of education: Not on file  . Highest education level: Not on file  Occupational History  . Not on file  Tobacco Use  . Smoking status: Former Research scientist (life sciences)  . Smokeless tobacco: Never Used  . Tobacco comment: smoked only as a teenager  Substance and Sexual  Activity  . Alcohol use: No  . Drug use: No  . Sexual activity: Not on file  Other Topics Concern  . Not on file  Social History Narrative  . Not on file   Social Determinants of Health   Financial Resource Strain:   . Difficulty of Paying Living Expenses: Not on file  Food Insecurity:     . Worried About Charity fundraiser in the Last Year: Not on file  . Ran Out of Food in the Last Year: Not on file  Transportation Needs:   . Lack of Transportation (Medical): Not on file  . Lack of Transportation (Non-Medical): Not on file  Physical Activity:   . Days of Exercise per Week: Not on file  . Minutes of Exercise per Session: Not on file  Stress:   . Feeling of Stress : Not on file  Social Connections:   . Frequency of Communication with Friends and Family: Not on file  . Frequency of Social Gatherings with Friends and Family: Not on file  . Attends Religious Services: Not on file  . Active Member of Clubs or Organizations: Not on file  . Attends Archivist Meetings: Not on file  . Marital Status: Not on file  Intimate Partner Violence:   . Fear of Current or Ex-Partner: Not on file  . Emotionally Abused: Not on file  . Physically Abused: Not on file  . Sexually Abused: Not on file    FAMILY HISTORY: No family history on file.  ALLERGIES:  has No Known Allergies.  MEDICATIONS:  Current Outpatient Medications  Medication Sig Dispense Refill  . acetaminophen (TYLENOL) 325 MG tablet Take 2 tablets (650 mg total) by mouth every 6 (six) hours.    Marland Kitchen albuterol (VENTOLIN HFA) 108 (90 Base) MCG/ACT inhaler Inhale 2 puffs into the lungs every 6 (six) hours as needed for wheezing or shortness of breath. 18 g 0  . diclofenac Sodium (VOLTAREN) 1 % GEL Apply 1 application topically 4 (four) times daily as needed (knee pain).    Marland Kitchen ibuprofen (ADVIL) 200 MG tablet Take 3 tablets (600 mg total) by mouth every 6 (six) hours. 30 tablet 0  . nystatin (MYCOSTATIN/NYSTOP) powder Apply 1 application topically 3 (three) times daily. 15 g 0  . oxyCODONE (ROXICODONE) 5 MG immediate release tablet Take 1 tablet (5 mg total) by mouth every 6 (six) hours as needed for severe pain. 20 tablet 0  . [START ON 08/11/2019] rivaroxaban (XARELTO) 20 MG TABS tablet Take 1 tablet (20 mg total)  by mouth daily with supper. 30 tablet 1  . Rivaroxaban 15 & 20 MG TBPK Follow package directions: Take one 59m tablet by mouth twice a day. On day 22, switch to one 221mtablet once a day. Take with food. 51 each 0   No current facility-administered medications for this visit.    REVIEW OF SYSTEMS:   Constitutional: ( - ) fevers, ( - )  chills , ( - ) night sweats Eyes: ( - ) blurriness of vision, ( - ) double vision, ( - ) watery eyes Ears, nose, mouth, throat, and face: ( - ) mucositis, ( - ) sore throat Respiratory: ( - ) cough, ( - ) dyspnea, ( - ) wheezes Cardiovascular: ( - ) palpitation, ( - ) chest discomfort, ( - ) lower extremity swelling Gastrointestinal:  ( - ) nausea, ( - ) heartburn, ( - ) change in bowel habits Skin: ( - ) abnormal skin  rashes Lymphatics: ( - ) new lymphadenopathy, ( - ) easy bruising Neurological: ( - ) numbness, ( - ) tingling, ( - ) new weaknesses Behavioral/Psych: ( - ) mood change, ( - ) new changes  All other systems were reviewed with the patient and are negative.  PHYSICAL EXAMINATION: ECOG PERFORMANCE STATUS: {CHL ONC ECOG [phone]}  There were no vitals filed for this visit. There were no vitals filed for this visit.  GENERAL: well appearing *** in NAD  SKIN: skin color, texture, turgor are normal, no rashes or significant lesions EYES: conjunctiva are pink and non-injected, sclera clear OROPHARYNX: no exudate, no erythema; lips, buccal mucosa, and tongue normal  NECK: supple, non-tender LYMPH:  no palpable lymphadenopathy in the cervical, axillary or inguinal LUNGS: clear to auscultation and percussion with normal breathing effort HEART: regular rate & rhythm and no murmurs and no lower extremity edema ABDOMEN: soft, non-tender, non-distended, normal bowel sounds Musculoskeletal: no cyanosis of digits and no clubbing  PSYCH: alert & oriented x 3, fluent speech NEURO: no focal motor/sensory deficits  LABORATORY DATA:  I have  reviewed the data as listed CBC Latest Ref Rng & Units 07/11/2019 06/14/2019 06/13/2019  WBC 4.0 - 10.5 K/uL 6.7 4.5 -  Hemoglobin 12.0 - 15.0 g/dL 13.0 12.6 13.6  Hematocrit 36.0 - 46.0 % 42.5 40.0 40.0  Platelets 150 - 400 K/uL 201 191 -    CMP Latest Ref Rng & Units 07/11/2019 06/14/2019 06/13/2019  Glucose 70 - 99 mg/dL 81 87 -  BUN 6 - 20 mg/dL 13 9 -  Creatinine 0.44 - 1.00 mg/dL 0.91 0.78 -  Sodium 135 - 145 mmol/L 140 141 139  Potassium 3.5 - 5.1 mmol/L 4.6 3.7 4.2  Chloride 98 - 111 mmol/L 104 100 -  CO2 22 - 32 mmol/L 26 26 -  Calcium 8.9 - 10.3 mg/dL 9.2 9.3 -  Total Protein 6.5 - 8.1 g/dL - - -  Total Bilirubin 0.3 - 1.2 mg/dL - - -  Alkaline Phos 38 - 126 U/L - - -  AST 15 - 41 U/L - - -  ALT 0 - 44 U/L - - -     PATHOLOGY: None to review.    RADIOGRAPHIC STUDIES:  DG Tibia/Fibula Right  Result Date: 07/11/2019 CLINICAL DATA:  Pain and swelling in right leg EXAM: RIGHT TIBIA AND FIBULA - 2 VIEW COMPARISON:  None. FINDINGS: No fracture or dislocation of the right tibia or fibula. Mild arthrosis of the included knee and ankle joints. Diffuse soft tissue edema about the included leg. IMPRESSION: No fracture or dislocation of the right tibia or fibula. Diffuse soft tissue edema about the included leg. Electronically Signed   By: Eddie Candle M.D.   On: 07/11/2019 14:20   VAS [place] LOWER EXTREMITY VENOUS (DVT) (ONLY [facility] & [facility] 7a-7p)  Result Date: 07/17/2019  Lower Venous Study Indications: Pain, and Patient diagnosed with popliteal DVT 07/11/19, on Coumadin.  Limitations: Body habitus and depth of vessels, patient's pain with compression. Comparison Study: Prior study done 07/11/19 Performing Technologist: Sharion Dove RVS  Examination Guidelines: A complete evaluation includes B-mode imaging, spectral Doppler, color Doppler, and power Doppler as needed of all accessible portions of each vessel. Bilateral testing is considered an integral part of a complete examination.  Limited examinations for reoccurring indications may be performed as noted.  +---------+---------------+---------+-----------+----------+-------------------+ RIGHT    CompressibilityPhasicitySpontaneityPropertiesThrombus Aging      +---------+---------------+---------+-----------+----------+-------------------+ CFV  patent by color and                                                       Doppler             +---------+---------------+---------+-----------+----------+-------------------+ SFJ                                                   patent by color and                                                       Doppler             +---------+---------------+---------+-----------+----------+-------------------+ FV Prox                                               patent by color and                                                       Doppler             +---------+---------------+---------+-----------+----------+-------------------+ FV Mid                                                patent by color and                                                       Doppler             +---------+---------------+---------+-----------+----------+-------------------+ FV Distal                                             patent by color and                                                       Doppler             +---------+---------------+---------+-----------+----------+-------------------+ PFV  Not visualized      +---------+---------------+---------+-----------+----------+-------------------+ POP      None                                         Acute               +---------+---------------+---------+-----------+----------+-------------------+ PTV                                                   patent by color and                                                        Doppler             +---------+---------------+---------+-----------+----------+-------------------+ PERO                                                  patent by color and                                                       Doppler             +---------+---------------+---------+-----------+----------+-------------------+ Gastroc  None                                         Acute               +---------+---------------+---------+-----------+----------+-------------------+   Left Technical Findings: Left leg not evaluated.   Summary: Right: Findings consistent with acute deep vein thrombosis involving the right popliteal vein, and right gastrocnemius veins. Findings suggest new clot progression as compared to previous examination. DVT noted in the popliteal 07/11/19 remains. DVT now noted in the right gastrocnemius vein.  *See table(s) above for measurements and observations. Electronically signed by Deitra Mayo MD on 07/17/2019 at 6:49:41 AM.    Final    VAS [place] LOWER EXTREMITY VENOUS (DVT) ([facility] 7a-7p)  Result Date: 07/12/2019  Lower Venous Study Indications: Swelling.  Risk Factors: DVT. Limitations: Body habitus, poor ultrasound/tissue interface and patient pain tolerance. Comparison Study: 06/13/2019 - Negative for DVT. Performing Technologist: Oliver Hum RVT  Examination Guidelines: A complete evaluation includes B-mode imaging, spectral Doppler, color Doppler, and power Doppler as needed of all accessible portions of each vessel. Bilateral testing is considered an integral part of a complete examination. Limited examinations for reoccurring indications may be performed as noted.  +---------+---------------+---------+-----------+----------+--------------+ RIGHT    CompressibilityPhasicitySpontaneityPropertiesThrombus Aging +---------+---------------+---------+-----------+----------+--------------+  CFV                     Yes      Yes                                 +---------+---------------+---------+-----------+----------+--------------+  SFJ      Full                                                        +---------+---------------+---------+-----------+----------+--------------+ FV Prox  Full                                                        +---------+---------------+---------+-----------+----------+--------------+ FV Mid                  Yes      Yes                                 +---------+---------------+---------+-----------+----------+--------------+ FV Distal               Yes      Yes                                 +---------+---------------+---------+-----------+----------+--------------+ POP      None           No       No                   Acute          +---------+---------------+---------+-----------+----------+--------------+ PTV                                                   Not visualized +---------+---------------+---------+-----------+----------+--------------+ PERO                                                  Not visualized +---------+---------------+---------+-----------+----------+--------------+   +----+---------------+---------+-----------+----------+--------------+ LEFTCompressibilityPhasicitySpontaneityPropertiesThrombus Aging +----+---------------+---------+-----------+----------+--------------+ CFV Full           Yes      Yes                                 +----+---------------+---------+-----------+----------+--------------+     Summary: Right: Findings consistent with acute deep vein thrombosis involving the right popliteal vein. No cystic structure found in the popliteal fossa. Left: No evidence of common femoral vein obstruction.  *See table(s) above for measurements and observations. Electronically signed by Ruta Hinds MD on 07/12/2019 at 1:45:58 PM.    Final     ASSESSMENT &  PLAN Angel Brewer 54 y.o. female with medical history significant for OSA on CPAP, morbid obesity, and PVD who presents for evaluation of recurrent VTEs.   #Recurrent VTEs --  No orders of the defined types were placed in this encounter.   All questions were answered. The patient knows to call the clinic with any problems, questions or concerns.  A total of more than {CHL ONC TIME VISIT - KZLDJ:[id number]} were spent on this encounter and  over half of that time was spent on counseling and coordination of care as outlined above.   Ledell Peoples, MD Department of Hematology/Oncology [place] at [facility] Phone: [phone] Pager: [phone] Email: [email or web address]  07/29/2019 11:22 AM

## 2019-08-01 ENCOUNTER — Other Ambulatory Visit: Payer: Medicaid Other

## 2019-08-01 ENCOUNTER — Encounter: Payer: Medicaid Other | Admitting: Hematology and Oncology

## 2019-08-11 ENCOUNTER — Encounter: Payer: Self-pay | Admitting: *Deleted

## 2019-10-04 ENCOUNTER — Ambulatory Visit: Payer: Medicaid Other | Admitting: Nurse Practitioner

## 2019-10-04 ENCOUNTER — Other Ambulatory Visit: Payer: Self-pay | Admitting: Nurse Practitioner

## 2019-10-04 DIAGNOSIS — I82401 Acute embolism and thrombosis of unspecified deep veins of right lower extremity: Secondary | ICD-10-CM

## 2019-10-04 MED ORDER — RIVAROXABAN 20 MG PO TABS: 20.0000 mg | ORAL_TABLET | Freq: Every day | ORAL | 1 refills | Status: DC

## 2019-10-27 ENCOUNTER — Telehealth: Payer: Self-pay | Admitting: Hematology and Oncology

## 2019-10-27 NOTE — Telephone Encounter (Signed)
Pt cld and rescheduled her new hem appt to 5/3 at 9am w/Dr. Lorenso Courier.

## 2019-11-04 ENCOUNTER — Other Ambulatory Visit: Payer: Self-pay | Admitting: Orthopedic Surgery

## 2019-11-07 ENCOUNTER — Telehealth: Payer: Self-pay | Admitting: Nurse Practitioner

## 2019-11-07 NOTE — Telephone Encounter (Signed)
Patient called and requested for a refill on rivaroxaban (XARELTO) 20 MG TABS tablet [[id number]] ENDED. Patient stated that she took her last dose today. Please follow up at your earliest convenience.

## 2019-11-08 NOTE — Telephone Encounter (Signed)
Will route to PCP for refill. Pt. Have an upcoming appt. With Oncologist on 11/14/19.

## 2019-11-10 ENCOUNTER — Other Ambulatory Visit: Payer: Self-pay | Admitting: Nurse Practitioner

## 2019-11-10 DIAGNOSIS — I82401 Acute embolism and thrombosis of unspecified deep veins of right lower extremity: Secondary | ICD-10-CM

## 2019-11-10 MED ORDER — RIVAROXABAN 20 MG PO TABS: 20.0000 mg | ORAL_TABLET | Freq: Every day | ORAL | 0 refills | Status: DC

## 2019-11-10 NOTE — Telephone Encounter (Signed)
Spoke to patient and informed on refill and advising. Pt. Understood.

## 2019-11-10 NOTE — Telephone Encounter (Signed)
Medication sent for 10 days. She must follow up with oncology and they must verify that she needs to continue on xarelto for additional refills.

## 2019-11-14 ENCOUNTER — Encounter: Payer: Self-pay | Admitting: Hematology and Oncology

## 2019-11-14 ENCOUNTER — Other Ambulatory Visit (HOSPITAL_COMMUNITY): Payer: Medicaid Other

## 2019-11-14 ENCOUNTER — Inpatient Hospital Stay: Payer: Medicaid Other

## 2019-11-14 ENCOUNTER — Inpatient Hospital Stay: Payer: Medicaid Other | Attending: Hematology and Oncology | Admitting: Hematology and Oncology

## 2019-11-14 ENCOUNTER — Other Ambulatory Visit: Payer: Self-pay

## 2019-11-14 VITALS — BP 206/108 | HR 80 | Temp 98.0°F | Resp 18 | Ht 68.0 in | Wt >= 6400 oz

## 2019-11-14 DIAGNOSIS — Z86711 Personal history of pulmonary embolism: Secondary | ICD-10-CM | POA: Insufficient documentation

## 2019-11-14 DIAGNOSIS — Z9989 Dependence on other enabling machines and devices: Secondary | ICD-10-CM | POA: Insufficient documentation

## 2019-11-14 DIAGNOSIS — Z7901 Long term (current) use of anticoagulants: Secondary | ICD-10-CM | POA: Insufficient documentation

## 2019-11-14 DIAGNOSIS — G4733 Obstructive sleep apnea (adult) (pediatric): Secondary | ICD-10-CM | POA: Insufficient documentation

## 2019-11-14 DIAGNOSIS — Z791 Long term (current) use of non-steroidal anti-inflammatories (NSAID): Secondary | ICD-10-CM | POA: Insufficient documentation

## 2019-11-14 DIAGNOSIS — I82561 Chronic embolism and thrombosis of right calf muscular vein: Secondary | ICD-10-CM | POA: Diagnosis not present

## 2019-11-14 DIAGNOSIS — Z86718 Personal history of other venous thrombosis and embolism: Secondary | ICD-10-CM | POA: Insufficient documentation

## 2019-11-14 DIAGNOSIS — Z87891 Personal history of nicotine dependence: Secondary | ICD-10-CM | POA: Diagnosis not present

## 2019-11-14 DIAGNOSIS — I739 Peripheral vascular disease, unspecified: Secondary | ICD-10-CM | POA: Insufficient documentation

## 2019-11-14 DIAGNOSIS — Z79899 Other long term (current) drug therapy: Secondary | ICD-10-CM | POA: Diagnosis not present

## 2019-11-14 LAB — CMP (CANCER CENTER ONLY)
ALT: 10 U/L (ref 0–44)
AST: 13 U/L — ABNORMAL LOW (ref 15–41)
Albumin: 3.4 g/dL — ABNORMAL LOW (ref 3.5–5.0)
Alkaline Phosphatase: 51 U/L (ref 38–126)
Anion gap: 9 (ref 5–15)
BUN: 11 mg/dL (ref 6–20)
CO2: 31 mmol/L (ref 22–32)
Calcium: 9.1 mg/dL (ref 8.9–10.3)
Chloride: 105 mmol/L (ref 98–111)
Creatinine: 0.81 mg/dL (ref 0.44–1.00)
GFR, Est AFR Am: >60 mL/min (ref >60)
GFR, Estimated: >60 mL/min (ref >60)
Glucose, Bld: 74 mg/dL (ref 70–99)
Potassium: 4.6 mmol/L (ref 3.5–5.1)
Sodium: 145 mmol/L (ref 135–145)
Total Bilirubin: 0.8 mg/dL (ref 0.3–1.2)
Total Protein: 7.5 g/dL (ref 6.5–8.1)

## 2019-11-14 LAB — CBC WITH DIFFERENTIAL (CANCER CENTER ONLY)
Abs Immature Granulocytes: 0.02 10*3/uL (ref 0.00–0.07)
Basophils Absolute: 0 10*3/uL (ref 0.0–0.1)
Basophils Relative: 1 %
Eosinophils Absolute: 0.1 10*3/uL (ref 0.0–0.5)
Eosinophils Relative: 3 %
HCT: 42.8 % (ref 36.0–46.0)
Hemoglobin: 12.9 g/dL (ref 12.0–15.0)
Immature Granulocytes: 1 %
Lymphocytes Relative: 25 %
Lymphs Abs: 0.9 10*3/uL (ref 0.7–4.0)
MCH: 27.4 pg (ref 26.0–34.0)
MCHC: 30.1 g/dL (ref 30.0–36.0)
MCV: 90.9 fL (ref 80.0–100.0)
Monocytes Absolute: 0.4 10*3/uL (ref 0.1–1.0)
Monocytes Relative: 10 %
Neutro Abs: 2.3 10*3/uL (ref 1.7–7.7)
Neutrophils Relative %: 60 %
Platelet Count: 218 10*3/uL (ref 150–400)
RBC: 4.71 MIL/uL (ref 3.87–5.11)
RDW: 15.8 % — ABNORMAL HIGH (ref 11.5–15.5)
WBC Count: 3.8 10*3/uL — ABNORMAL LOW (ref 4.0–10.5)
nRBC: 0 % (ref 0.0–0.2)

## 2019-11-14 MED ORDER — RIVAROXABAN 20 MG PO TABS: 20.0000 mg | ORAL_TABLET | Freq: Every day | ORAL | 0 refills | Status: DC

## 2019-11-14 MED ORDER — RIVAROXABAN 20 MG PO TABS: 20.0000 mg | ORAL_TABLET | Freq: Every day | ORAL | 1 refills | Status: DC

## 2019-11-14 NOTE — Progress Notes (Signed)
Angel Brewer [phone]   Fax:([phone] [place] NOTE  Patient Care Team: [name], NP as PCP - General (Nurse Practitioner)  Hematological/Oncological History # History of Recurrent VTE 1) 02/29/2016: presented to the [facility] ED with SOB. CT PE study showed bilateral pulmonary emboli most prominent in the right lower lobe with a moderate burden. Thought to be provoked 2/2 to immobility. Treated with coumadin therapy  2) 03/01/2016: LE US showed subacute deep vein thrombosis involving  the popliteal vein of the right lower extremity.  3) 10/13/2016: Seen by Dr. Ophelia Shoulder at the [facility]. Recommended d/c anticoagulation therapy 4) 06/13/2019: presented to the ED with SOB. CT PE study negative for PE. LE US showed no evidence of DVT.  5) 07/11/2019: LE US showed acute deep vein thrombosis involving the  right popliteal vein. Thought to be provoked by wrist surgery. Started Xarelto 32m daily.  6) 11/14/2019: establish care with Dr. DLorenso Courier  CHIEF COMPLAINTS/PURPOSE OF CONSULTATION:  "History of Recurrent VTE "  HISTORY OF PRESENTING ILLNESS:  Angel Brewer 54y.o. female with medical history significant for OSA on CPAP, peripheral vascular disease, morbid obesity who presents for evaluation of recurrent VTE.   On review of the previous records Angel Brewer was initially diagnosed with a VTE on 02/29/2016 at which [facility].  A CT PE study the time showed bilateral pulmonary embolism most prominent in the right lower lobe with moderate burden.  This was thought to be secondary to immobility from a prolonged car ride.  She was started on Coumadin therapy and continue this for 6 months.  Additionally during that admission on 03/01/2016 she underwent a lower extremity Doppler which showed subacute deep vein thrombosis involving the popliteal vein and the right lower extremity.  On 10/13/2016 the  patient [facility] and was recommended that she discontinue anticoagulation therapy.  More recently on 07/01/2019 the patient presented to the emergency room with tightness in her right lower extremity.  She had a lower extremity Doppler which showed acute deep vein thrombosis involving the right popliteal vein.  This was thought to be provoked by her wrist surgery.  She was started on Xarelto therapy 20 mg daily but she has continued until now.  Due to concern for an upcoming wrist surgery the patient was referred to hematology for further evaluation management.  On exam today Angel Brewer notes that she continues to have tightness in the back of her right lower extremity.  She notes that this occurred the day after she had her wrist surgery.  She notes that this was not associated with any chest pain or shortness of breath.  She notes that when she was started on Xarelto therapy she had no trouble with bleeding, bruising, or dark stools.  She also reports that she has no other history that would be consistent with a VTE other than her admission in 2017.  On further review she notes that she does have a history of OSA and currently uses her CPAP sporadically.  She has no family history of VTE's, CVAs, or myocardial infarctions..  She is not currently taking any medications known to cause venous thromboembolism's.  She is unsure whether or not she is going through menopause and notes that her periods have always been sporadic and irregular.  She currently denies having any fevers, chills, sweats, nausea, vomiting or diarrhea.  Full 10 point ROS is listed below.  MEDICAL HISTORY:  Past Medical History:  Diagnosis Date  . DVT (deep vein thrombosis) in pregnancy    RLE DVT 02/2016  . Dyspnea   . Morbid obesity ([place])   . PE (pulmonary embolism) 02/29/2016  . Peripheral vascular disease ([place])   . Sleep apnea    uses a cpap    SURGICAL HISTORY: Past Surgical  History:  Procedure Laterality Date  . CESAREAN SECTION     x 3  . OPEN REDUCTION INTERNAL FIXATION (ORIF) DISTAL RADIAL FRACTURE Right 06/20/2019   Procedure: OPEN REDUCTION INTERNAL FIXATION (ORIF) DISTAL RADIAL FRACTURE;  Surgeon: [name], MD;  Location: [place];  Service: Orthopedics;  Laterality: Right;  REGIONAL BLOCK TOTAL SURGERY TIME: 90 MINUTES    SOCIAL HISTORY: Social History   Socioeconomic History  . Marital status: Single    Spouse name: Not on file  . Number of children: Not on file  . Years of education: Not on file  . Highest education level: Not on file  Occupational History  . Not on file  Tobacco Use  . Smoking status: Former Research scientist (life sciences)  . Smokeless tobacco: Never Used  . Tobacco comment: smoked only as a teenager  Substance and Sexual Activity  . Alcohol use: No  . Drug use: No  . Sexual activity: Not on file  Other Topics Concern  . Not on file  Social History Narrative  . Not on file   Social Determinants of Health   Financial Resource Strain:   . Difficulty of Paying Living Expenses:   Food Insecurity:   . Worried About Charity fundraiser in the Last Year:   . Arboriculturist in the Last Year:   Transportation Needs:   . Film/video editor (Medical):   Marland Kitchen Lack of Transportation (Non-Medical):   Physical Activity:   . Days of Exercise per Week:   . Minutes of Exercise per Session:   Stress:   . Feeling of Stress :   Social Connections:   . Frequency of Communication with Friends and Family:   . Frequency of Social Gatherings with Friends and Family:   . Attends Religious Services:   . Active Member of Clubs or Organizations:   . Attends Archivist Meetings:   Marland Kitchen Marital Status:   Intimate Partner Violence:   . Fear of Current or Ex-Partner:   . Emotionally Abused:   Marland Kitchen Physically Abused:   . Sexually Abused:     FAMILY HISTORY: History reviewed. No pertinent family history.  ALLERGIES:  has No Known Allergies.   MEDICATIONS:  Current Outpatient Medications  Medication Sig Dispense Refill  . acetaminophen (TYLENOL) 325 MG tablet Take 2 tablets (650 mg total) by mouth every 6 (six) hours. (Patient taking differently: Take 975 mg by mouth every 6 (six) hours as needed (pain.). )    . diclofenac Sodium (VOLTAREN) 1 % GEL Apply 1 application topically 4 (four) times daily as needed (knee pain).    . rivaroxaban (XARELTO) 20 MG TABS tablet Take 1 tablet (20 mg total) by mouth daily with supper. 30 tablet 1   No current facility-administered medications for this visit.    REVIEW OF SYSTEMS:   Constitutional: ( - ) fevers, ( - )  chills , ( - ) night sweats Eyes: ( - ) blurriness of vision, ( - ) double vision, ( - ) watery eyes Ears, nose, mouth, throat, and face: ( - )  mucositis, ( - ) sore throat Respiratory: ( - ) cough, ( - ) dyspnea, ( - ) wheezes Cardiovascular: ( - ) palpitation, ( - ) chest discomfort, ( - ) lower extremity swelling Gastrointestinal:  ( - ) nausea, ( - ) heartburn, ( - ) change in bowel habits Skin: ( - ) abnormal skin rashes Lymphatics: ( - ) new lymphadenopathy, ( - ) easy bruising Neurological: ( - ) numbness, ( - ) tingling, ( - ) new weaknesses Behavioral/Psych: ( - ) mood change, ( - ) new changes  All other systems were reviewed with the patient and are negative.  PHYSICAL EXAMINATION: ECOG PERFORMANCE STATUS: 1 - Symptomatic but completely ambulatory  Vitals:   11/14/19 0928  BP: (!) 206/108  Pulse: 80  Resp: 18  Temp: 98 F (36.7 C)  SpO2: 99%   Filed Weights   11/14/19 0928  Weight: (!) 423 lb 4.8 oz (192 kg)    GENERAL: well appearing obese African American female in NAD  SKIN: skin color, texture, turgor are normal, no rashes or significant lesions EYES: conjunctiva are pink and non-injected, sclera clear LUNGS: clear to auscultation and percussion with normal breathing effort HEART: regular rate & rhythm and no murmurs and no lower extremity edema  ABDOMEN: limited 2/2 to body habitus.  Musculoskeletal: no cyanosis of digits and no clubbing  PSYCH: alert & oriented x 3, fluent speech NEURO: no focal motor/sensory deficits  LABORATORY DATA:  I have reviewed the data as listed CBC Latest Ref Rng & Units 11/14/2019 07/11/2019 06/14/2019  WBC 4.0 - 10.5 K/uL 3.8(L) 6.7 4.5  Hemoglobin 12.0 - 15.0 g/dL 12.9 13.0 12.6  Hematocrit 36.0 - 46.0 % 42.8 42.5 40.0  Platelets 150 - 400 K/uL 218 201 191    CMP Latest Ref Rng & Units 11/14/2019 07/11/2019 06/14/2019  Glucose 70 - 99 mg/dL 74 81 87  BUN 6 - 20 mg/dL _0 Creatinine 0.44 - 1.00 mg/dL 0.81 0.91 0.78  Sodium 135 - 145 mmol/L 145 140 141  Potassium 3.5 - 5.1 mmol/L 4.6 4.6 3.7  Chloride 98 - 111 mmol/L 105 104 100  CO2 22 - 32 mmol/L _1 Calcium 8.9 - 10.3 mg/dL 9.1 9.2 9.3  Total Protein 6.5 - 8.1 g/dL 7.5 - -  Total Bilirubin 0.3 - 1.2 mg/dL 0.8 - -  Alkaline Phos 38 - 126 U/L 51 - -  AST 15 - 41 U/L 13(L) - -  ALT 0 - 44 U/L 10 - -    PATHOLOGY: None relevant to review.   RADIOGRAPHIC STUDIES: I have personally reviewed the radiological images as listed and agreed with the findings in the report. No results found.  ASSESSMENT & PLAN Angel Brewer 54 y.o. female with medical history significant for OSA on CPAP, peripheral vascular disease, morbid obesity who presents for evaluation of recurrent VTE.  After review of records, discussion with the patient, and reviewed the prior imaging her findings are most consistent with recurrent provoked VTE's.  Her initial PE and DVT in 2017 was provoked due to a prolonged car ride from [place] to [place].  Additionally her second DVT in 2020 was closely related to a wrist surgery.  The patient has an upcoming surgery and recommendations regarding prophylaxis for VTE are requested.  I would recommend the patient receive a full 6 months of therapy for her prior VTE.  This would put the completion date at approximately the  end of June.  Given this the patient would receive at least 2 months of full dose anticoagulation as prophylaxis for her VTE.  The patient is at the highest risk of developing recurrent VTE within 6 months of the surgery.  As such after she has completed her course for the prior clot I would recommend aspirin 81 mg daily until a total of 6 months after her surgery.  After that time she does not require any further routine anticoagulation.  I would strongly recommend that if the patient were to be planned for an additional surgery that the hematology oncology service be notified for rendering recommendations for VTE prophylaxis.  One additional point I would like to make is that [place] therapy is not well studied in patients with BMI is greater than 40.  This patient currently has a BMI of 64 and in patient populations like this there is no clear evidence based way to administer DOAC therapy.  Expert opinion notes the [place] can be safely given as long as the risks and benefits of this therapy are understood ( Blood. 2020 Mar 19;135(12):904-911.).   #Recurrent Provoked VTEs (Pulmonary Embolism/DVT in 2017, DVT 2020)  --patient has history of two provoked VTEs, with one in August 2017 (due to a prolonged car ride) and recent in Dec 2020 (following a wrist surgery) --at this time I would recommend a full 6 months of anticoagulation therapy. This would end in approximately January 09, 2020.  --recommend holding Xarelto 48 hour prior to her surgery. Xarelto can be restarted once the surgical service is confident adequate hemostasis has been achieved --of note, the use of DOAC therapy is patient's with BMI >40 is not well studied. Expert opinion notes that as long as the risks/benefits are understood this therapy can be administered.  --recommend SCDs in the perioperative period while the patient is without anticoagulation protection.  --encourage early ambulation and leg exercises following the surgery.  --once  patient has completed Xarelto therapy would recommend starting ASA 7m x 4 additional months as long term VTE prophylaxis post operatively.  --strict return precautions for signs/symptoms of recurrent VTE --no need to f/u in our clinic routinely. Please re-refer in the event of another planned surgery for our assistance in anticoagulation management.   Orders Placed This Encounter  Procedures  . CBC with Differential (Cancer Center Only)    Standing Status:   Future    Number of Occurrences:   1    Standing Expiration Date:   11/13/2020  . CMP ([facility]    Standing Status:   Future    Number of Occurrences:   1    Standing Expiration Date:   11/13/2020   All questions were answered. The patient knows to call the clinic with any problems, questions or concerns.  A total of more than 60 minutes were spent on this encounter and over half of that time was spent on counseling and coordination of care as outlined above.   JLedell Peoples MD Department of Hematology/Oncology CDelmarat [facility] LLCPhone: [phone]: [phone]: [email or web address]  11/14/2019 12:45 PM   Literature Support:  WSheran Spine Carrier M. How I treat obese patients with oral anticoagulants. Blood. 2020 Mar 19;135(12):904-911. doi: 10.1182/blood.[id number] PMID: [id number]  --We feel comfortable using standard treatment doses of DOACs in patients with VTE or AF and a body weight >120 kg and/or BMI >40 kg/m2 as long as there is shared decision-making after an informed discussion of available evidence; More prospective  studies are needed in morbidly obese patients, particularly in the VTE population   -- We use the standard VTE prevention doses of DOACs in morbidly obese patients who require thromboprophylaxis after surgery   Nemeth B, Lijfering WM, Nelissen [facility], et al. Risk and Risk Factors Associated With Recurrent Venous Thromboembolism Following Surgery in  Patients With History of Venous Thromboembolism [published correction appears in JAMA Netw Open. 2019 Jun 5;2(6):e196420]. JAMA Netw Open. 2019;2(5):e193690. Published 2019 May 3. Doi:10.1001/[email or web address]  --The cumulative incidence of recurrent VTE after surgery was 2.1% at 1 month, 3.3% at 3 months, and 4.6% at 6 months, compared with an incidence of 0.8% at 3 months in patients not exposed to surgery. Patients who underwent gastrointestinal, major orthopedic, or cancer-related surgery experienced the highest cumulative incidence of VTE (5%-9%) by 6 months.

## 2019-11-16 NOTE — Progress Notes (Addendum)
[id number] [place] - [address] [place] [address]Phone: [phone]: [phone]   Your procedure is scheduled on Monday, May 10th.  [facility].M., and check in at the Admitting office.  Call this number if you have problems the morning of surgery:  [phone] Call [phone] you have any questions prior to your surgery date Monday-Friday 8am-4pm   Remember:  Do not eat after midnight the night before your surgery  You may drink clear liquids until 4:15 A.M. the morning of your surgery.   Clear liquids allowed are: Water, Non-Citrus Juices (without pulp), Carbonated Beverages, Clear Tea, Black Coffee Only, and Gatorade   Please complete your PRE-SURGERY ENSURE that was provided to you by 4:15 A.M. the morning of surgery.  Please, if able, drink it in one setting. DO NOT SIP.   Take these medicines the morning of surgery with A SIP OF WATER   If needed - acetaminophen (TYLENOL)   Per Dr. JJenny ReichmannDorsey's recommendations for Xarelto: Recommend holding Xarelto 48 hour prior to her surgery. Xarelto can be restarted once the surgical service is confident adequate hemostasis has been achieved  As of today, STOP taking diclofenac Sodium (VOLTAREN),  any Aspirin (unless otherwise instructed by your surgeon) and Aspirin containing products, Aleve, Naproxen, Ibuprofen, Motrin, Advil, Goody's, BC's, all herbal medications, fish oil, and all vitamins.                     Do not wear jewelry, make up, or nail polish            Do not wear lotions, powders, perfumes, or deodorant.            Do not shave 48 hours prior to surgery.             Do not bring valuables to the hospital.            [facility] belongings or [facility].  Do NOT Smoke (Tobacco/Vapping) or drink Alcohol 24 hours prior to your procedure If you use a CPAP at night, you may bring all equipment for  your overnight stay.   Contacts, glasses, dentures or bridgework may not be worn into surgery.      For patients admitted to the hospital, discharge time will be determined by your treatment team.   Patients discharged the day of surgery will not be allowed to drive home, and someone needs to stay with them for 24 hours.  Special instructions:   [place]- Preparing For Surgery  Before surgery, you can play an important role. Because skin is not sterile, your skin needs to be as free of germs as possible. You can reduce the number of germs on your skin by washing with CHG (chlorahexidine gluconate) Soap before surgery.  CHG is an antiseptic cleaner which kills germs and bonds with the skin to continue killing germs even after washing.    Oral Hygiene is also important to reduce your risk of infection.  Remember - BRUSH YOUR TEETH THE MORNING OF SURGERY WITH YOUR REGULAR TOOTHPASTE  Please do not use if you have an allergy to CHG or antibacterial soaps. If your skin becomes reddened/irritated stop using the CHG.  Do not shave (including legs and underarms) for at least 48 hours prior to first CHG shower. It is OK to shave your face.  Please follow these instructions  carefully.   1. Shower the NIGHT BEFORE SURGERY and the MORNING OF SURGERY with CHG Soap.   2. If you chose to wash your hair, wash your hair first as usual with your normal shampoo.  3. After you shampoo, rinse your hair and body thoroughly to remove the shampoo.  4. Use CHG as you would any other liquid soap. You can apply CHG directly to the skin and wash gently with a scrungie or a clean washcloth.   5. Apply the CHG Soap to your body ONLY FROM THE NECK DOWN.  Do not use on open wounds or open sores. Avoid contact with your eyes, ears, mouth and genitals (private parts). Wash Face and genitals (private parts)  with your normal soap.   6. Wash thoroughly, paying special attention to the area where your surgery will be  performed.  7. Thoroughly rinse your body with warm water from the neck down.  8. DO NOT shower/wash with your normal soap after using and rinsing off the CHG Soap.  9. Pat yourself dry with a CLEAN TOWEL.  10. Wear CLEAN PAJAMAS to bed the night before surgery, wear comfortable clothes the morning of surgery  11. Place CLEAN SHEETS on your bed the night of your first shower and DO NOT SLEEP WITH PETS.   Day of Surgery: Shower with CHG soap as instructed above Do not apply any deodorants/lotions.  Please wear clean clothes to the hospital/surgery center.   Remember to brush your teeth WITH YOUR REGULAR TOOTHPASTE.   Please read over the following fact sheets that you were given.

## 2019-11-17 ENCOUNTER — Encounter (HOSPITAL_COMMUNITY)
Admission: RE | Admit: 2019-11-17 | Discharge: 2019-11-17 | Disposition: A | Discharge status: Home / Self Care | Payer: Medicaid Other | Source: Ambulatory Visit | Attending: Orthopedic Surgery | Admitting: Orthopedic Surgery

## 2019-11-17 ENCOUNTER — Other Ambulatory Visit: Payer: Self-pay

## 2019-11-17 ENCOUNTER — Other Ambulatory Visit (HOSPITAL_COMMUNITY)
Admission: RE | Admit: 2019-11-17 | Discharge: 2019-11-17 | Disposition: A | Discharge status: Home / Self Care | Payer: Medicaid Other | Source: Ambulatory Visit | Attending: Orthopedic Surgery | Admitting: Orthopedic Surgery

## 2019-11-17 ENCOUNTER — Encounter (HOSPITAL_COMMUNITY): Payer: Self-pay

## 2019-11-17 DIAGNOSIS — Z01812 Encounter for preprocedural laboratory examination: Secondary | ICD-10-CM | POA: Insufficient documentation

## 2019-11-17 DIAGNOSIS — Z20822 Contact with and (suspected) exposure to covid-19: Secondary | ICD-10-CM | POA: Insufficient documentation

## 2019-11-17 LAB — SARS CORONAVIRUS 2 (TAT 6-24 HRS): SARS Coronavirus 2: NEGATIVE

## 2019-11-17 NOTE — H&P (Addendum)
Angel Brewer is an 54 y.o. female.   CC / Reason for Visit: Right upper extremity follow-up HPI: This patient returns to clinic today for reevaluation of her right wrist indicating that she has pain occasionally in her ulnar forearm running down to the dorsum of her small and ring fingers.  She states that she has one more therapy visit.  She is back to work full duty.  She attends this appointment along with her nurse case manager.    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  She continues to have a Bahr ulnar-sided pain but has improved such that she does not wear the splint anymore.  She use to wear it some at nighttime but not anymore.  She has been engaged also in therapy.  Past Medical History:  Diagnosis Date  . DVT (deep vein thrombosis) in pregnancy    RLE DVT 02/2016  . Dyspnea   . Morbid obesity ([place])   . PE (pulmonary embolism) 02/29/2016  . Peripheral vascular disease ([place])   . Sleep apnea    uses a cpap    Past Surgical History:  Procedure Laterality Date  . CESAREAN SECTION     x 3  . OPEN REDUCTION INTERNAL FIXATION (ORIF) DISTAL RADIAL FRACTURE Right 06/20/2019   Procedure: OPEN REDUCTION INTERNAL FIXATION (ORIF) DISTAL RADIAL FRACTURE;  Surgeon: [name], MD;  Location: [place];  Service: Orthopedics;  Laterality: Right;  REGIONAL BLOCK TOTAL SURGERY TIME: 86 MINUTES    No family history on file. Social History:  reports that she has quit smoking. She has never used smokeless tobacco. She reports that she does not drink alcohol or use drugs.  Allergies: No Known Allergies  No medications prior to admission.    Results for orders placed or performed during the hospital encounter of 11/17/19 (from the past 48 hour(s))  SARS CORONAVIRUS 2 (TAT 6-24 HRS) Nasopharyngeal Nasopharyngeal Swab     Status: None   Collection Time: 11/17/19  8:53  AM   Specimen: Nasopharyngeal Swab  Result Value Ref Range   SARS Coronavirus 2 NEGATIVE NEGATIVE    Comment: (NOTE) SARS-CoV-2 target nucleic acids are NOT DETECTED. The SARS-CoV-2 RNA is generally detectable in upper and lower respiratory specimens during the acute phase of infection. Negative results do not preclude SARS-CoV-2 infection, do not rule out co-infections with other pathogens, and should not be used as the sole basis for treatment or other patient management decisions. Negative results must be combined with clinical observations, patient history, and epidemiological information. The expected result is Negative. Fact Sheet for Patients: SugarRoll.be Fact Sheet for Healthcare Providers: https://www.woods-mathews.com/ This test is not yet approved or cleared by the [place] FDA and  has been authorized for detection and/or diagnosis of SARS-CoV-2 by FDA under an Emergency Use Authorization (EUA). This EUA will remain  in effect (meaning this test can be used) for the duration of the COVID-19 declaration under Section 56 4(b)(1) of the Act, 21 U.S.C. section 360bbb-3(b)(1), unless the authorization is terminated or revoked sooner. Performed at [facility], [place] [address], [place], [place] [address]    No results found.  Review of Systems  All other systems reviewed and are negative.   Last menstrual period 11/29/2015. Physical Exam  Constitutional:  WD, WN, NAD HEENT:  NCAT, EOMI Neuro/Psych:  Alert & oriented to person, place, and time; appropriate  mood & affect Lymphatic: No generalized UE edema or lymphadenopathy Extremities / MSK:  Both UE are normal with respect to appearance, ranges of motion, joint stability, muscle strength/tone, sensation, & perfusion except as otherwise noted:  Incision continues to mature.  Full digital motion.  Pronation full, supination 70, wrist extension 45, flexion 65, grip  strength in position 2: Right 35/left 90. Patient had a slight click with Watson's, but no relocation clunk.  There is some pain elicited with performance of the Watson's maneuver.  NVI without any altered sensibility ulnarly.  Labs / Xrays:  4 views of the right wrist ordered and obtained today including an AP clenched fist view reveals intact plate and screw fixation of comminuted intra-articular distal radius fracture with increased osseous healing.  There is some ossification lying distal to the ulnar styloid.  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  Today's findings were discussed with her.  The competing choices of maintained observation and addressing any long-term posttraumatic wrist arthritis that most commonly develops with scapholunate dissociation was compared and contrasted to proceeding with acute scapholunate ligament reconstruction, with the goal of minimizing risks for development of posttraumatic arthritis such SLAC wrist.  After careful consideration deliberation, she indicated she would like to proceed with ligament reconstruction at this time.  We will plan then to go forward once authorization is received.  The details of the operative procedure were discussed with the patient.  Questions were invited and answered.  In addition to the goal of the procedure, the risks of the procedure to include but not limited to bleeding; infection; damage to the nerves or blood vessels that could result in bleeding, numbness, weakness, chronic pain, and the need for additional procedures; stiffness; the need for revision surgery; and anesthetic risks were reviewed.  No specific outcome was guaranteed or implied.  Informed  consent was obtained.   Jolyn Nap, MD 11/17/2019, 6:19 PM

## 2019-11-17 NOTE — Progress Notes (Addendum)
PCP - Raul Del, NP  (pt denies seeing anyone because "everything is virtual now and I need to find someone to see me in person because no one is doing anything for my feet")  Cardiologist - pt denies   Chest x-ray - n/a  EKG - 06/14/19 Stress Test - 06/16/19 ECHO - pt denies Cardiac Cath - pt denies  CPAP - yes    Blood Thinner Instructions: Per Dr. Jenny Reichmann Dorsey's recommendations for Xarelto: Recommend holding Xarelto 48 hour prior to her surgery. Xarelto can be restarted once the surgical service is confident adequate hemostasis has been achieved   ERAS Protcol - yes PRE-SURGERY Ensure or G2- ensure  COVID TEST- 11/17/19    Anesthesia review: pt weighing >350lbs  Patient denies shortness of breath, fever, cough and chest pain at PAT appointment   All instructions explained to the patient, with a verbal understanding of the material. Patient agrees to go over the instructions while at home for a better understanding. Patient also instructed to self quarantine after being tested for COVID-19. The opportunity to ask questions was provided.

## 2019-11-18 MED ORDER — DEXTROSE 5 % IV SOLN
3.0000 g | INTRAVENOUS | Status: AC
  Administered 2019-11-21: 08:00:00 3 g via INTRAVENOUS
  Filled 2019-11-18: qty 3

## 2019-11-18 NOTE — Progress Notes (Signed)
Anesthesia Chart Review:  Patient has history of two provoked VTEs, with one in August 2017 (due to a prolonged car ride) and recent in Dec 2020 (following a wrist surgery). She is followed by heme/onc Dr. Lorenso Courier, last seen 11/14/19 with periop recommendations as follows: "at this time I would recommend a full 6 months of anticoagulation therapy. This would end in approximately January 09, 2020. Recommend holding Xarelto 48 hour prior to her surgery. Xarelto can be restarted once the surgical service is confident adequate hemostasis has been achieved. Of note, the use of DOAC therapy is patient's with BMI >40 is not well studied. Expert opinion notes that as long as the risks/benefits are understood this therapy can be administered. Recommend SCDs in the perioperative period while the patient is without anticoagulation protection. Encourage early ambulation and leg exercises following the surgery."  Pt does not have diagnosis of HTN and is not on any antiHTN meds, however BP has recently been elevated, per review of recent Epic encounters.  BP Readings from Last 3 Encounters:  11/17/19 (!) 172/92  11/14/19 (!) 206/108  07/16/19 (!) 149/79   Review of encounters prior to 07/16/19 shows normotensive readings. At ED visit 07/11/19 BP 123/90 and at PAT visit 06/16/19 BP was 135/76. Will need DOS eval.  Preop labs reviewed, unremarkable.   EKG 06/13/19: Sinus rhythm. Rate 61.  TTE 06/16/19: 1. Left ventricular ejection fraction, by visual estimation, is 60 to  65%. The left ventricle has normal function. Left ventricular septal wall  thickness was normal. Mildly increased left ventricular posterior wall  thickness. There is mildly increased  left ventricular hypertrophy.  2. Definity contrast agent was given IV to delineate the left ventricular  endocardial borders.  3. Left ventricular diastolic parameters are consistent with Grade I  diastolic dysfunction (impaired relaxation).  4. Global right  ventricle has normal systolic function.The right  ventricular size is normal. No increase in right ventricular wall  thickness.  5. Left atrial size was normal.  6. Right atrial size was mildly dilated.  7. The mitral valve is normal in structure. No evidence of mitral valve  regurgitation. No evidence of mitral stenosis.  8. The tricuspid valve is normal in structure. Tricuspid valve  regurgitation is not demonstrated.  9. The aortic valve is normal in structure. Aortic valve regurgitation is  not visualized. No evidence of aortic valve sclerosis or stenosis.  10. The pulmonic valve was normal in structure. Pulmonic valve  regurgitation is not visualized.   Wynonia Musty [facility] Short Stay Center/Anesthesiology Phone [phone] 11/18/2019 10:07 AM

## 2019-11-18 NOTE — Anesthesia Preprocedure Evaluation (Addendum)
Anesthesia Evaluation  Patient identified by MRN, date of birth, ID band Patient awake    Reviewed: Allergy & Precautions, NPO status , Patient's Chart, lab work & pertinent test results  History of Anesthesia Complications Negative for: history of anesthetic complications  Airway Mallampati: III  TM Distance: >3 FB Neck ROM: Full    Dental  (+) Edentulous Upper, Edentulous Lower   Pulmonary sleep apnea and Continuous Positive Airway Pressure Ventilation , former smoker,    Pulmonary exam normal        Cardiovascular hypertension, + DVT (2017, on Xarelto)  Normal cardiovascular exam     Neuro/Psych negative neurological ROS  negative psych ROS   GI/Hepatic negative GI ROS, Neg liver ROS,   Endo/Other  Morbid obesity (BMI 65)  Renal/GU negative Renal ROS  negative genitourinary   Musculoskeletal negative musculoskeletal ROS (+)   Abdominal   Peds  Hematology negative hematology ROS (+)   Anesthesia Other Findings Day of surgery medications reviewed with patient.  Reproductive/Obstetrics negative OB ROS                           Anesthesia Physical Anesthesia Plan  ASA: III  Anesthesia Plan: Regional and MAC   Post-op Pain Management:    Induction:   PONV Risk Score and Plan: 2 and Treatment may vary due to age or medical condition, Propofol infusion, Ondansetron and Midazolam  Airway Management Planned: Natural Airway and Simple Face Mask  Additional Equipment: None  Intra-op Plan:   Post-operative Plan:   Informed Consent: I have reviewed the patients History and Physical, chart, labs and discussed the procedure including the risks, benefits and alternatives for the proposed anesthesia with the patient or authorized representative who has indicated his/her understanding and acceptance.       Plan Discussed with: CRNA  Anesthesia Plan Comments: (Patient has history of  two provoked VTEs, with one in August 2017 (due to a prolonged car ride) and recent in Dec 2020 (following a wrist surgery). She is followed by heme/onc Dr. Lorenso Courier, last seen 11/14/19 with periop recommendations as follows: "at this time I would recommend a full 6 months of anticoagulation therapy. This would end in approximately January 09, 2020. Recommend holding Xarelto 48 hour prior to her surgery. Xarelto can be restarted once the surgical service is confident adequate hemostasis has been achieved. Of note, the use of DOAC therapy is patient's with BMI >40 is not well studied. Expert opinion notes that as long as the risks/benefits are understood this therapy can be administered. Recommend SCDs in the perioperative period while the patient is without anticoagulation protection. Encourage early ambulation and leg exercises following the surgery."  Pt does not have diagnosis of HTN and is not on any antiHTN meds, however BP has recently been elevated, per review of recent Epic encounters.  BP Readings from Last 3 Encounters: 11/17/19 (!) 172/92 11/14/19 (!) 206/108 07/16/19 (!) 149/79  Review of encounters prior to 07/16/19 shows normotensive readings. At ED visit 07/11/19 BP 123/90 and at PAT visit 06/16/19 BP was 135/76. Will need DOS eval.  Preop labs reviewed, unremarkable.   EKG 06/13/19: Sinus rhythm. Rate 61.  TTE 06/16/19: 1. Left ventricular ejection fraction, by visual estimation, is 60 to  65%. The left ventricle has normal function. Left ventricular septal wall  thickness was normal. Mildly increased left ventricular posterior wall  thickness. There is mildly increased  left ventricular hypertrophy.  2. Definity contrast agent  was given IV to delineate the left ventricular  endocardial borders.  3. Left ventricular diastolic parameters are consistent with Grade I  diastolic dysfunction (impaired relaxation).  4. Global right ventricle has normal systolic function.The right   ventricular size is normal. No increase in right ventricular wall  thickness.  5. Left atrial size was normal.  6. Right atrial size was mildly dilated.  7. The mitral valve is normal in structure. No evidence of mitral valve  regurgitation. No evidence of mitral stenosis.  8. The tricuspid valve is normal in structure. Tricuspid valve  regurgitation is not demonstrated.  9. The aortic valve is normal in structure. Aortic valve regurgitation is  not visualized. No evidence of aortic valve sclerosis or stenosis.  10. The pulmonic valve was normal in structure. Pulmonic valve  regurgitation is not visualized. )      Anesthesia Quick Evaluation

## 2019-11-21 ENCOUNTER — Ambulatory Visit (HOSPITAL_COMMUNITY): Payer: Medicaid Other

## 2019-11-21 ENCOUNTER — Ambulatory Visit (HOSPITAL_COMMUNITY)
Admission: RE | Admit: 2019-11-21 | Discharge: 2019-11-21 | Disposition: A | Discharge status: Home / Self Care | Payer: Worker's Compensation | Attending: Orthopedic Surgery | Admitting: Orthopedic Surgery

## 2019-11-21 ENCOUNTER — Encounter (HOSPITAL_COMMUNITY)
Admission: RE | Disposition: A | Discharge status: Home / Self Care | Payer: Self-pay | Source: Home / Self Care | Attending: Orthopedic Surgery

## 2019-11-21 ENCOUNTER — Ambulatory Visit (HOSPITAL_COMMUNITY): Payer: Worker's Compensation | Admitting: Physician Assistant

## 2019-11-21 ENCOUNTER — Encounter (HOSPITAL_COMMUNITY): Payer: Self-pay | Admitting: Orthopedic Surgery

## 2019-11-21 ENCOUNTER — Ambulatory Visit (HOSPITAL_COMMUNITY): Payer: Worker's Compensation | Admitting: Anesthesiology

## 2019-11-21 ENCOUNTER — Other Ambulatory Visit: Payer: Self-pay

## 2019-11-21 DIAGNOSIS — Y939 Activity, unspecified: Secondary | ICD-10-CM | POA: Insufficient documentation

## 2019-11-21 DIAGNOSIS — I10 Essential (primary) hypertension: Secondary | ICD-10-CM | POA: Insufficient documentation

## 2019-11-21 DIAGNOSIS — Z86711 Personal history of pulmonary embolism: Secondary | ICD-10-CM | POA: Insufficient documentation

## 2019-11-21 DIAGNOSIS — I739 Peripheral vascular disease, unspecified: Secondary | ICD-10-CM | POA: Insufficient documentation

## 2019-11-21 DIAGNOSIS — X58XXXA Exposure to other specified factors, initial encounter: Secondary | ICD-10-CM | POA: Insufficient documentation

## 2019-11-21 DIAGNOSIS — G4730 Sleep apnea, unspecified: Secondary | ICD-10-CM | POA: Diagnosis not present

## 2019-11-21 DIAGNOSIS — S63391A Traumatic rupture of other ligament of right wrist, initial encounter: Secondary | ICD-10-CM | POA: Diagnosis present

## 2019-11-21 DIAGNOSIS — Z86718 Personal history of other venous thrombosis and embolism: Secondary | ICD-10-CM | POA: Diagnosis not present

## 2019-11-21 DIAGNOSIS — Z419 Encounter for procedure for purposes other than remedying health state, unspecified: Secondary | ICD-10-CM

## 2019-11-21 DIAGNOSIS — M24231 Disorder of ligament, right wrist: Secondary | ICD-10-CM | POA: Insufficient documentation

## 2019-11-21 DIAGNOSIS — Z87891 Personal history of nicotine dependence: Secondary | ICD-10-CM | POA: Insufficient documentation

## 2019-11-21 DIAGNOSIS — Z7901 Long term (current) use of anticoagulants: Secondary | ICD-10-CM | POA: Diagnosis not present

## 2019-11-21 DIAGNOSIS — R0600 Dyspnea, unspecified: Secondary | ICD-10-CM | POA: Insufficient documentation

## 2019-11-21 DIAGNOSIS — Z6841 Body Mass Index (BMI) 40.0 and over, adult: Secondary | ICD-10-CM | POA: Diagnosis not present

## 2019-11-21 HISTORY — PX: SYNOVECTOMY: SHX5180

## 2019-11-21 SURGERY — SYNOVECTOMY
Anesthesia: Monitor Anesthesia Care | Laterality: Right

## 2019-11-21 MED ORDER — LIDOCAINE HCL (PF) 1 % IJ SOLN
INTRAMUSCULAR | Status: DC | PRN
  Administered 2019-11-21: 7 mL

## 2019-11-21 MED ORDER — LIDOCAINE HCL 1 % IJ SOLN
INTRAMUSCULAR | Status: AC
  Filled 2019-11-21: qty 20

## 2019-11-21 MED ORDER — LACTATED RINGERS IV SOLN
INTRAVENOUS | Status: DC | PRN
Start: 2019-11-21 — End: 2019-11-21

## 2019-11-21 MED ORDER — MIDAZOLAM HCL 5 MG/5ML IJ SOLN
INTRAMUSCULAR | Status: DC | PRN
  Administered 2019-11-21 (×2): 1 mg via INTRAVENOUS

## 2019-11-21 MED ORDER — PROPOFOL 10 MG/ML IV BOLUS
INTRAVENOUS | Status: DC | PRN
  Administered 2019-11-21 (×3): 20 mg via INTRAVENOUS

## 2019-11-21 MED ORDER — FENTANYL CITRATE (PF) 250 MCG/5ML IJ SOLN
INTRAMUSCULAR | Status: AC
  Filled 2019-11-21: qty 5

## 2019-11-21 MED ORDER — DEXAMETHASONE SODIUM PHOSPHATE 10 MG/ML IJ SOLN
INTRAMUSCULAR | Status: DC | PRN
  Administered 2019-11-21: 5 mg via INTRAVENOUS

## 2019-11-21 MED ORDER — FENTANYL CITRATE (PF) 100 MCG/2ML IJ SOLN
INTRAMUSCULAR | Status: DC | PRN
  Administered 2019-11-21: 25 ug via INTRAVENOUS
  Administered 2019-11-21: 50 ug via INTRAVENOUS
  Administered 2019-11-21: 25 ug via INTRAVENOUS

## 2019-11-21 MED ORDER — RIVAROXABAN 20 MG PO TABS: 20.0000 mg | ORAL_TABLET | Freq: Every day | ORAL | 1 refills | Status: DC

## 2019-11-21 MED ORDER — PROMETHAZINE HCL 25 MG/ML IJ SOLN: 6.2500 mg | INTRAMUSCULAR | Status: DC | PRN

## 2019-11-21 MED ORDER — BUPIVACAINE HCL (PF) 0.25 % IJ SOLN
INTRAMUSCULAR | Status: AC
  Filled 2019-11-21: qty 30

## 2019-11-21 MED ORDER — ONDANSETRON HCL 4 MG/2ML IJ SOLN
INTRAMUSCULAR | Status: DC | PRN
  Administered 2019-11-21: 4 mg via INTRAVENOUS

## 2019-11-21 MED ORDER — OXYCODONE HCL 5 MG PO TABS: 5.0000 mg | ORAL_TABLET | Freq: Once | ORAL | Status: DC | PRN

## 2019-11-21 MED ORDER — BUPIVACAINE-EPINEPHRINE (PF) 0.5% -1:200000 IJ SOLN
INTRAMUSCULAR | Status: DC | PRN
Start: 2019-11-21 — End: 2019-11-21
  Administered 2019-11-21: 30 mL via PERINEURAL

## 2019-11-21 MED ORDER — LIDOCAINE 2% (20 MG/ML) 5 ML SYRINGE
INTRAMUSCULAR | Status: DC | PRN
  Administered 2019-11-21: 20 mg via INTRAVENOUS

## 2019-11-21 MED ORDER — 0.9 % SODIUM CHLORIDE (POUR BTL) OPTIME
TOPICAL | Status: DC | PRN
  Administered 2019-11-21: 1000 mL

## 2019-11-21 MED ORDER — OXYCODONE HCL 5 MG/5ML PO SOLN: 5.0000 mg | Freq: Once | ORAL | Status: DC | PRN

## 2019-11-21 MED ORDER — ACETAMINOPHEN 325 MG PO TABS
650.0000 mg | ORAL_TABLET | Freq: Four times a day (QID) | ORAL | Status: DC
Start: 2019-11-21 — End: 2020-12-30

## 2019-11-21 MED ORDER — GLYCOPYRROLATE PF 0.2 MG/ML IJ SOSY
PREFILLED_SYRINGE | INTRAMUSCULAR | Status: DC | PRN
  Administered 2019-11-21: .1 mg via INTRAVENOUS

## 2019-11-21 MED ORDER — FENTANYL CITRATE (PF) 100 MCG/2ML IJ SOLN: 25.0000 ug | INTRAMUSCULAR | Status: DC | PRN

## 2019-11-21 MED ORDER — ACETAMINOPHEN 500 MG PO TABS
1000.0000 mg | ORAL_TABLET | Freq: Once | ORAL | Status: AC
  Administered 2019-11-21: 1000 mg via ORAL
  Filled 2019-11-21: qty 2

## 2019-11-21 MED ORDER — OXYCODONE HCL 5 MG PO TABS: 5.0000 mg | ORAL_TABLET | Freq: Four times a day (QID) | ORAL | 0 refills | Status: DC | PRN

## 2019-11-21 MED ORDER — PROPOFOL 500 MG/50ML IV EMUL
INTRAVENOUS | Status: DC | PRN
  Administered 2019-11-21: 50 ug/kg/min via INTRAVENOUS
  Administered 2019-11-21: 80 ug/kg/min via INTRAVENOUS

## 2019-11-21 MED ORDER — MIDAZOLAM HCL 2 MG/2ML IJ SOLN
INTRAMUSCULAR | Status: AC
  Filled 2019-11-21: qty 2

## 2019-11-21 SURGICAL SUPPLY — 46 items
ANCHOR DX SWIVELOCK SL 3.5X8.5 (Anchor) ×2 IMPLANT
ANCHOR REPAIR HAND WRIST (Orthopedic Implant) ×2 IMPLANT
BENZOIN TINCTURE PRP APPL 2/3 (GAUZE/BANDAGES/DRESSINGS) ×2 IMPLANT
BLADE SURG 15 STRL LF DISP TIS (BLADE) ×1 IMPLANT
BLADE SURG 15 STRL SS (BLADE) ×2
BNDG COHESIVE 2X5 TAN STRL LF (GAUZE/BANDAGES/DRESSINGS) IMPLANT
BNDG COHESIVE 4X5 TAN STRL (GAUZE/BANDAGES/DRESSINGS) ×3 IMPLANT
BNDG ESMARK 4X9 LF (GAUZE/BANDAGES/DRESSINGS) ×3 IMPLANT
BNDG GAUZE ELAST 4 BULKY (GAUZE/BANDAGES/DRESSINGS) ×4 IMPLANT
BRUSH SCRUB EZ PLAIN DRY (MISCELLANEOUS) IMPLANT
CANISTER SUCT 3000ML PPV (MISCELLANEOUS) ×3 IMPLANT
CHLORAPREP W/TINT 26 (MISCELLANEOUS) ×3 IMPLANT
CLOSURE WOUND 1/2 X4 (GAUZE/BANDAGES/DRESSINGS) ×1
CORD BIPOLAR FORCEPS 12FT (ELECTRODE) ×3 IMPLANT
COVER WAND RF STERILE (DRAPES) ×3 IMPLANT
CUFF TOURN SGL QUICK 18X4 (TOURNIQUET CUFF) IMPLANT
CUFF TOURN SGL QUICK 24 (TOURNIQUET CUFF)
CUFF TRNQT CYL 24X4X16.5-23 (TOURNIQUET CUFF) IMPLANT
DRAPE SURG 17X23 STRL (DRAPES) ×3 IMPLANT
DRSG ADAPTIC 3X8 NADH LF (GAUZE/BANDAGES/DRESSINGS) ×3 IMPLANT
DRSG EMULSION OIL 3X3 NADH (GAUZE/BANDAGES/DRESSINGS) IMPLANT
GAUZE SPONGE 4X4 12PLY STRL (GAUZE/BANDAGES/DRESSINGS) ×3 IMPLANT
GLOVE BIO SURGEON STRL SZ7.5 (GLOVE) ×3 IMPLANT
GLOVE BIOGEL PI IND STRL 8 (GLOVE) ×1 IMPLANT
GLOVE BIOGEL PI INDICATOR 8 (GLOVE) ×2
GOWN STRL REUS W/ TWL XL LVL3 (GOWN DISPOSABLE) ×1 IMPLANT
GOWN STRL REUS W/TWL XL LVL3 (GOWN DISPOSABLE) ×2
K-WIRE .045X4 (WIRE) ×2 IMPLANT
K-WIRE SURGICAL 1.6X102 (WIRE) ×4 IMPLANT
KIT BASIN OR (CUSTOM PROCEDURE TRAY) ×3 IMPLANT
NDL HYPO 25X1 1.5 SAFETY (NEEDLE) IMPLANT
NEEDLE HYPO 25X1 1.5 SAFETY (NEEDLE) ×3 IMPLANT
NS IRRIG 1000ML POUR BTL (IV SOLUTION) ×3 IMPLANT
PACK ORTHO EXTREMITY (CUSTOM PROCEDURE TRAY) ×3 IMPLANT
PAD CAST 4YDX4 CTTN HI CHSV (CAST SUPPLIES) ×1 IMPLANT
PADDING CAST ABS 4INX4YD NS (CAST SUPPLIES)
PADDING CAST ABS COTTON 4X4 ST (CAST SUPPLIES) IMPLANT
PADDING CAST COTTON 4X4 STRL (CAST SUPPLIES) ×2
SLING ARM FOAM STRAP XLG (SOFTGOODS) ×2 IMPLANT
STRIP CLOSURE SKIN 1/2X4 (GAUZE/BANDAGES/DRESSINGS) ×1 IMPLANT
SUT VIC AB 2-0 CT3 27 (SUTURE) ×2 IMPLANT
SUT VICRYL 4-0 PS2 18IN ABS (SUTURE) IMPLANT
SUT VICRYL RAPIDE 4/0 PS 2 (SUTURE) ×3 IMPLANT
SYR 10ML LL (SYRINGE) IMPLANT
TOWEL GREEN STERILE FF (TOWEL DISPOSABLE) ×3 IMPLANT
UNDERPAD 30X36 HEAVY ABSORB (UNDERPADS AND DIAPERS) ×3 IMPLANT

## 2019-11-21 NOTE — Anesthesia Procedure Notes (Signed)
Procedure Name: MAC Date/Time: 11/21/2019 7:30 AM Performed by: Trinna Post., CRNA Pre-anesthesia Checklist: Patient identified, Emergency Drugs available, Suction available, Patient being monitored and Timeout performed Patient Re-evaluated:Patient Re-evaluated prior to induction Oxygen Delivery Method: Simple face mask Preoxygenation: Pre-oxygenation with 100% oxygen Induction Type: IV induction Placement Confirmation: positive ETCO2

## 2019-11-21 NOTE — Transfer of Care (Signed)
Immediate Anesthesia Transfer of Care Note  Patient: Romaine Norling  Procedure(s) Performed: RIGHT WRIST LIGAMENT RECONSTRUCTION (Right )  Patient Location: PACU  Anesthesia Type:General and Regional  Level of Consciousness: awake, alert  and oriented  Airway & Oxygen Therapy: Patient Spontanous Breathing and Patient connected to face mask oxygen  Post-op Assessment: Report given to RN and Post -op Vital signs reviewed and stable  Post vital signs: Reviewed and stable  Last Vitals:  Vitals Value Taken Time  BP 119/64 11/21/19 0921  Temp 36.2 C 11/21/19 0921  Pulse 83 11/21/19 0924  Resp 15 11/21/19 0924  SpO2 88 % 11/21/19 0924  Vitals shown include unvalidated device data.  Last Pain:  Vitals:   11/21/19 0921  PainSc: (P) 0-No pain         Complications: No apparent anesthesia complications

## 2019-11-21 NOTE — Op Note (Addendum)
11/21/2019  7:24 AM  PATIENT:  Angel Brewer  54 y.o. female  PRE-OPERATIVE DIAGNOSIS: Painful right wrist scapholunate dissociation  POST-OPERATIVE DIAGNOSIS:  Same  PROCEDURE: 1.  Right wrist ligament reconstruction for scapholunate dissociation    2.  Right wrist PIN neurectomy  SURGEON: Rayvon Char. Grandville Silos, MD  PHYSICIAN ASSISTANT: Morley Kos, OPA-C  Use of an operative assistant was essential for retraction, manual reduction of the dissociated bones via joysticks while graft was tensioned, closing, and positioning for splinting, all of which reduced operative time and exposure, infection risk, etc.  ANESTHESIA:  regional and MAC  SPECIMENS:  None  DRAINS:   None  EBL:  Minimal  PREOPERATIVE INDICATIONS:  Florabel Domingos is a  54 y.o. female with right wrist scapholunate dissociation, without arthritic changes  The risks benefits and alternatives were discussed with the patient preoperatively including but not limited to the risks of infection, bleeding, nerve injury, cardiopulmonary complications, the need for revision surgery, among others, and the patient verbalized understanding and consented to proceed.  OPERATIVE IMPLANTS: Arthrex 3.5 x 8 mm anchors x3, 0.045 inch K wire x1  OPERATIVE PROCEDURE:  After receiving prophylactic antibiotics and a regional block, the patient was escorted to the operative theatre and placed in a supine position.  A surgical "time-out" was performed during which the planned procedure, proposed operative site, and the correct patient identity were compared to the operative consent and agreement confirmed by the circulating nurse according to current facility policy.  Following application of a tourniquet to the operative extremity, the exposed skin was prepped with Chloraprep and draped in the usual sterile fashion.  The limb was exsanguinated with an Esmarch bandage and the tourniquet inflated to approximately 118mHg higher than systolic  BP.  A 3 limb zigzag incision was marked and made over the dorsum of the wrist and full-thickness flaps were elevated.  The third compartment was opened and the EPL transposed radially.  The fourth compartment was then elevated ulnarly and the distal 3 to 4 cm of the PIN was excised with Bovie electrocautery, cauterizing the proximal end and leaving it well proximal near the muscular portion of muscles in that compartment.  A ligament sparing capsulotomy was performed.  The scapholunate interval was evaluated in the dorsal ligament portion found to the scarred and dissociated.  This was cleaned up with a rondure.  0.062 inch K wires were then driven into the lunate and the scaphoid use as joysticks, and the dissociation was found to be reduced.  The second compartment was opened and a 2 mm strip of ECRB was harvested, just longer than 10 cm.  2-0 FiberWire was placed into it as a whipstitch to control the end.  3 tunnels were then placed in standard fashion, one in the lunate into the scaphoid.  The wound was irrigated.  The graft together with a fiber tape was placed into the proximal portion of the scaphoid.  The dissociation is and reduced and held reduced throughout the remainder of the procedure, which included placing the graft across the scapholunate interval and securing it into the lunate, then reversing it and bringing it as a second tail into the distal aspect of the scaphoid tunnel.  Still holding the reduction, the extra portion of tendon and fiber loop suture was all excised.  A 0.045 inch K wire was driven percutaneously across the scaphoid just distal to the waist into the capitate.  Final images were obtained.  This K wire was bent over at  the skin 90 degrees and clipped.  The wound was irrigated and the capsule repaired with 2-0 Vicryl interrupted suture.  The EPL was returned to its bed in the fourth compartment as well, repairing the retinaculum over the blisters, as well as very loosely  reapproximating the investing compartment of the second compartment.  The tendons could all move and glide easily.  Tourniquet was released and additional hemostasis obtained.  The skin was then reapproximated with 2-0 Vicryl deep dermal buried sutures followed by running 4-0 Vicryl Rapide subcuticular suture with benzoin and Steri-Strips and a short arm splint dressing was applied.  She was taken to recovery in stable condition.  DISPOSITION: She will be discharged home adaptable instructions, returning to work in a week, with no work with the right hand, and seeing [place] 10 to 15 days postop.  X-rays of the right wrist should be obtained (3 views) in the splint before splint removal and conversion to short arm cast.

## 2019-11-21 NOTE — Interval H&P Note (Signed)
History and Physical Interval Note:  11/21/2019 7:23 AM  Angel Brewer  has presented today for surgery, with the diagnosis of RIGHT WRIST SPRAIN S63.8X1A.  The various methods of treatment have been discussed with the patient and family. After consideration of risks, benefits and other options for treatment, the patient has consented to  Procedure(s) with comments: RIGHT WRIST LIGAMENT RECONSTRUCTION (Right) - PROCEDURE: RIGHT WRIST LIGAMENT RECONSTRUCTION LENGTH OF SURGERY: 105 MIN as a surgical intervention.  The patient's history has been reviewed, patient examined, no change in status, stable for surgery.  I have reviewed the patient's chart and labs.  Questions were answered to the patient's satisfaction.     Jolyn Nap

## 2019-11-21 NOTE — Anesthesia Postprocedure Evaluation (Signed)
Anesthesia Post Note  Patient: Angel Brewer  Procedure(s) Performed: RIGHT WRIST LIGAMENT RECONSTRUCTION (Right )     Patient location during evaluation: PACU Anesthesia Type: Regional Level of consciousness: awake and alert and oriented Pain management: pain level controlled Vital Signs Assessment: post-procedure vital signs reviewed and stable Respiratory status: spontaneous breathing, nonlabored ventilation and respiratory function stable Cardiovascular status: blood pressure returned to baseline Postop Assessment: no apparent nausea or vomiting Anesthetic complications: no    Last Vitals:  Vitals:   11/21/19 0935 11/21/19 0950  BP: 134/78 127/70  Pulse: 78 77  Resp: 17 17  Temp:    SpO2: 97% 100%    Last Pain:  Vitals:   11/21/19 0921  PainSc: 0-No pain                 [name]

## 2019-11-21 NOTE — Anesthesia Procedure Notes (Signed)
Anesthesia Regional Block: Supraclavicular block   Pre-Anesthetic Checklist: ,, timeout performed, Correct Patient, Correct Site, Correct Laterality, Correct Procedure, Correct Position, site marked, Risks and benefits discussed, pre-op evaluation,  At surgeon's request and post-op pain management  Laterality: Right  Prep: Maximum Sterile Barrier Precautions used, chloraprep       Needles:  Injection technique: Single-shot  Needle Type: Echogenic Stimulator Needle     Needle Length: 9cm  Needle Gauge: 22     Additional Needles:   Procedures:,,,, ultrasound used (permanent image in chart),,,,  Narrative:  Start time: 11/21/2019 7:00 AM End time: 11/21/2019 7:05 AM Injection made incrementally with aspirations every 5 mL.  Performed by: Personally  Anesthesiologist: [name], MD  Additional Notes: Risks, benefits, and alternative discussed. Patient gave consent for procedure. Patient prepped and draped in sterile fashion. Sedation administered, patient remains easily responsive to voice. Relevant anatomy identified with ultrasound guidance. Local anesthetic given in 5cc increments with no signs or symptoms of intravascular injection. No pain or paraesthesias with injection. Patient monitored throughout procedure with signs of LAST or immediate complications. Tolerated well. Ultrasound image placed in chart.  Tawny Asal, MD

## 2019-11-21 NOTE — Discharge Instructions (Addendum)
Discharge Instructions  DO NOT RE-START YOUR Kieth Brightly 11-22-19  You have a dressing with a plaster splint incorporated in it. Move your fingers as much as possible, making a full fist and fully opening the fist. Elevate your hand to reduce pain & swelling of the digits.  Ice over the operative site may be helpful to reduce pain & swelling.  DO NOT USE HEAT. Pain medicine has been prescribed for you.  Tylenol 650 mg every 6 hours for pain. Take Oxycodone 5 mg additionally, but only for when the pain is severe Leave the dressing in place until you return to our office.  You may shower, but keep the bandage clean & dry.  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 Call our office and schedule an appointment for 10-15 days from surgery date.   Please call [phone] during normal business hours or [phone] after hours for any problems. Including the following:  - excessive redness of the incisions - drainage for more than 4 days - fever of more than 101.5 F  *Please note that pain medications will not be refilled after hours or on weekends.  WORK STATUS:  Out of work until 11/28/19 and then may return to work with no right handed work until first post operative appointment.

## 2019-11-22 ENCOUNTER — Other Ambulatory Visit: Payer: Self-pay

## 2019-11-22 ENCOUNTER — Inpatient Hospital Stay (HOSPITAL_COMMUNITY)
Admission: EM | Admit: 2019-11-22 | Discharge: 2019-12-02 | DRG: 682 | Disposition: A | Discharge status: Home Health | Payer: Medicaid Other | Attending: Family Medicine | Admitting: Family Medicine

## 2019-11-22 ENCOUNTER — Inpatient Hospital Stay (HOSPITAL_COMMUNITY): Payer: Medicaid Other

## 2019-11-22 ENCOUNTER — Encounter: Payer: Self-pay | Admitting: *Deleted

## 2019-11-22 ENCOUNTER — Emergency Department (HOSPITAL_COMMUNITY): Payer: Medicaid Other

## 2019-11-22 DIAGNOSIS — R0902 Hypoxemia: Secondary | ICD-10-CM | POA: Diagnosis not present

## 2019-11-22 DIAGNOSIS — E662 Morbid (severe) obesity with alveolar hypoventilation: Secondary | ICD-10-CM | POA: Diagnosis not present

## 2019-11-22 DIAGNOSIS — J9601 Acute respiratory failure with hypoxia: Secondary | ICD-10-CM | POA: Diagnosis not present

## 2019-11-22 DIAGNOSIS — M7989 Other specified soft tissue disorders: Secondary | ICD-10-CM | POA: Diagnosis not present

## 2019-11-22 DIAGNOSIS — I82442 Acute embolism and thrombosis of left tibial vein: Secondary | ICD-10-CM | POA: Diagnosis present

## 2019-11-22 DIAGNOSIS — R4182 Altered mental status, unspecified: Secondary | ICD-10-CM

## 2019-11-22 DIAGNOSIS — J690 Pneumonitis due to inhalation of food and vomit: Secondary | ICD-10-CM | POA: Diagnosis present

## 2019-11-22 DIAGNOSIS — Z20822 Contact with and (suspected) exposure to covid-19: Secondary | ICD-10-CM | POA: Diagnosis present

## 2019-11-22 DIAGNOSIS — R404 Transient alteration of awareness: Secondary | ICD-10-CM | POA: Diagnosis not present

## 2019-11-22 DIAGNOSIS — E872 Acidosis: Secondary | ICD-10-CM | POA: Diagnosis present

## 2019-11-22 DIAGNOSIS — J9621 Acute and chronic respiratory failure with hypoxia: Secondary | ICD-10-CM | POA: Diagnosis not present

## 2019-11-22 DIAGNOSIS — R1111 Vomiting without nausea: Secondary | ICD-10-CM | POA: Diagnosis not present

## 2019-11-22 DIAGNOSIS — R0602 Shortness of breath: Secondary | ICD-10-CM | POA: Diagnosis present

## 2019-11-22 DIAGNOSIS — G4733 Obstructive sleep apnea (adult) (pediatric): Secondary | ICD-10-CM | POA: Diagnosis not present

## 2019-11-22 DIAGNOSIS — Z452 Encounter for adjustment and management of vascular access device: Secondary | ICD-10-CM | POA: Diagnosis not present

## 2019-11-22 DIAGNOSIS — I110 Hypertensive heart disease with heart failure: Secondary | ICD-10-CM | POA: Diagnosis present

## 2019-11-22 DIAGNOSIS — Z87891 Personal history of nicotine dependence: Secondary | ICD-10-CM | POA: Diagnosis not present

## 2019-11-22 DIAGNOSIS — I5031 Acute diastolic (congestive) heart failure: Secondary | ICD-10-CM | POA: Diagnosis present

## 2019-11-22 DIAGNOSIS — J9622 Acute and chronic respiratory failure with hypercapnia: Secondary | ICD-10-CM | POA: Diagnosis not present

## 2019-11-22 DIAGNOSIS — Z86711 Personal history of pulmonary embolism: Secondary | ICD-10-CM | POA: Diagnosis not present

## 2019-11-22 DIAGNOSIS — K7200 Acute and subacute hepatic failure without coma: Secondary | ICD-10-CM | POA: Diagnosis not present

## 2019-11-22 DIAGNOSIS — J9690 Respiratory failure, unspecified, unspecified whether with hypoxia or hypercapnia: Secondary | ICD-10-CM | POA: Diagnosis not present

## 2019-11-22 DIAGNOSIS — Z6841 Body Mass Index (BMI) 40.0 and over, adult: Secondary | ICD-10-CM

## 2019-11-22 DIAGNOSIS — I10 Essential (primary) hypertension: Secondary | ICD-10-CM | POA: Diagnosis not present

## 2019-11-22 DIAGNOSIS — N170 Acute kidney failure with tubular necrosis: Principal | ICD-10-CM | POA: Diagnosis present

## 2019-11-22 DIAGNOSIS — I272 Pulmonary hypertension, unspecified: Secondary | ICD-10-CM | POA: Diagnosis present

## 2019-11-22 DIAGNOSIS — G9341 Metabolic encephalopathy: Secondary | ICD-10-CM | POA: Diagnosis not present

## 2019-11-22 DIAGNOSIS — N179 Acute kidney failure, unspecified: Secondary | ICD-10-CM

## 2019-11-22 DIAGNOSIS — N950 Postmenopausal bleeding: Secondary | ICD-10-CM | POA: Diagnosis present

## 2019-11-22 DIAGNOSIS — J9611 Chronic respiratory failure with hypoxia: Secondary | ICD-10-CM | POA: Diagnosis not present

## 2019-11-22 DIAGNOSIS — I82452 Acute embolism and thrombosis of left peroneal vein: Secondary | ICD-10-CM | POA: Diagnosis present

## 2019-11-22 DIAGNOSIS — I739 Peripheral vascular disease, unspecified: Secondary | ICD-10-CM | POA: Diagnosis present

## 2019-11-22 DIAGNOSIS — Z7901 Long term (current) use of anticoagulants: Secondary | ICD-10-CM | POA: Diagnosis not present

## 2019-11-22 DIAGNOSIS — I5030 Unspecified diastolic (congestive) heart failure: Secondary | ICD-10-CM | POA: Diagnosis not present

## 2019-11-22 DIAGNOSIS — Z86718 Personal history of other venous thrombosis and embolism: Secondary | ICD-10-CM

## 2019-11-22 DIAGNOSIS — R410 Disorientation, unspecified: Secondary | ICD-10-CM | POA: Diagnosis not present

## 2019-11-22 LAB — CBC WITH DIFFERENTIAL/PLATELET
Abs Immature Granulocytes: 0.14 10*3/uL — ABNORMAL HIGH (ref 0.00–0.07)
Basophils Absolute: 0 10*3/uL (ref 0.0–0.1)
Basophils Relative: 0 %
Eosinophils Absolute: 0 10*3/uL (ref 0.0–0.5)
Eosinophils Relative: 0 %
HCT: 48.3 % — ABNORMAL HIGH (ref 36.0–46.0)
Hemoglobin: 13.6 g/dL (ref 12.0–15.0)
Immature Granulocytes: 2 %
Lymphocytes Relative: 11 %
Lymphs Abs: 1 10*3/uL (ref 0.7–4.0)
MCH: 27.4 pg (ref 26.0–34.0)
MCHC: 28.2 g/dL — ABNORMAL LOW (ref 30.0–36.0)
MCV: 97.4 fL (ref 80.0–100.0)
Monocytes Absolute: 1.1 10*3/uL — ABNORMAL HIGH (ref 0.1–1.0)
Monocytes Relative: 12 %
Neutro Abs: 6.9 10*3/uL (ref 1.7–7.7)
Neutrophils Relative %: 75 %
Platelets: 252 10*3/uL (ref 150–400)
RBC: 4.96 MIL/uL (ref 3.87–5.11)
RDW: 15.5 % (ref 11.5–15.5)
WBC: 9.2 10*3/uL (ref 4.0–10.5)
nRBC: 0.7 % — ABNORMAL HIGH (ref 0.0–0.2)

## 2019-11-22 LAB — POCT I-STAT 7, (LYTES, BLD GAS, ICA,H+H)
Acid-Base Excess: 0 mmol/L (ref 0.0–2.0)
Acid-base deficit: 2 mmol/L (ref 0.0–2.0)
Bicarbonate: 30.9 mmol/L — ABNORMAL HIGH (ref 20.0–28.0)
Bicarbonate: 31.9 mmol/L — ABNORMAL HIGH (ref 20.0–28.0)
Calcium, Ion: 1.27 mmol/L (ref 1.15–1.40)
Calcium, Ion: 1.27 mmol/L (ref 1.15–1.40)
HCT: 45 % (ref 36.0–46.0)
HCT: 45 % (ref 36.0–46.0)
Hemoglobin: 15.3 g/dL — ABNORMAL HIGH (ref 12.0–15.0)
Hemoglobin: 15.3 g/dL — ABNORMAL HIGH (ref 12.0–15.0)
O2 Saturation: 95 %
O2 Saturation: 92 %
Patient temperature: 98.6
Patient temperature: 98.6
Potassium: 4.8 mmol/L (ref 3.5–5.1)
Potassium: 5.1 mmol/L (ref 3.5–5.1)
Sodium: 138 mmol/L (ref 135–145)
Sodium: 137 mmol/L (ref 135–145)
TCO2: 34 mmol/L — ABNORMAL HIGH (ref 22–32)
TCO2: 35 mmol/L — ABNORMAL HIGH (ref 22–32)
pCO2 arterial: 95 mmHg (ref 32.0–48.0)
pCO2 arterial: 87.5 mmHg (ref 32.0–48.0)
pH, Arterial: 7.12 — CL (ref 7.350–7.450)
pH, Arterial: 7.171 — CL (ref 7.350–7.450)
pO2, Arterial: 106 mmHg (ref 83.0–108.0)
pO2, Arterial: 83 mmHg (ref 83.0–108.0)

## 2019-11-22 LAB — I-STAT CHEM 8, ED
BUN: 24 mg/dL — ABNORMAL HIGH (ref 6–20)
Calcium, Ion: 1.04 mmol/L — ABNORMAL LOW (ref 1.15–1.40)
Chloride: 102 mmol/L (ref 98–111)
Creatinine, Ser: 2.3 mg/dL — ABNORMAL HIGH (ref 0.44–1.00)
Glucose, Bld: 123 mg/dL — ABNORMAL HIGH (ref 70–99)
HCT: 45 % (ref 36.0–46.0)
Hemoglobin: 15.3 g/dL — ABNORMAL HIGH (ref 12.0–15.0)
Potassium: 5.1 mmol/L (ref 3.5–5.1)
Sodium: 138 mmol/L (ref 135–145)
TCO2: 27 mmol/L (ref 22–32)

## 2019-11-22 LAB — TROPONIN I (HIGH SENSITIVITY)
Troponin I (High Sensitivity): 457 ng/L (ref <18)
Troponin I (High Sensitivity): 1072 ng/L (ref <18)
Troponin I (High Sensitivity): 1115 ng/L (ref <18)
Troponin I (High Sensitivity): 910 ng/L (ref <18)

## 2019-11-22 LAB — HEPATIC FUNCTION PANEL
ALT: 54 U/L — ABNORMAL HIGH (ref 0–44)
AST: 115 U/L — ABNORMAL HIGH (ref 15–41)
Albumin: 3.7 g/dL (ref 3.5–5.0)
Alkaline Phosphatase: 53 U/L (ref 38–126)
Bilirubin, Direct: 0.4 mg/dL — ABNORMAL HIGH (ref 0.0–0.2)
Indirect Bilirubin: 0.7 mg/dL (ref 0.3–0.9)
Total Bilirubin: 1.1 mg/dL (ref 0.3–1.2)
Total Protein: 7.9 g/dL (ref 6.5–8.1)

## 2019-11-22 LAB — POCT I-STAT EG7
Acid-base deficit: 3 mmol/L — ABNORMAL HIGH (ref 0.0–2.0)
Bicarbonate: 26 mmol/L (ref 20.0–28.0)
Calcium, Ion: 1.05 mmol/L — ABNORMAL LOW (ref 1.15–1.40)
HCT: 46 % (ref 36.0–46.0)
Hemoglobin: 15.6 g/dL — ABNORMAL HIGH (ref 12.0–15.0)
O2 Saturation: 93 %
Potassium: 5.1 mmol/L (ref 3.5–5.1)
Sodium: 138 mmol/L (ref 135–145)
TCO2: 28 mmol/L (ref 22–32)
pCO2, Ven: 60.6 mmHg — ABNORMAL HIGH (ref 44.0–60.0)
pH, Ven: 7.241 — ABNORMAL LOW (ref 7.250–7.430)
pO2, Ven: 82 mmHg — ABNORMAL HIGH (ref 32.0–45.0)

## 2019-11-22 LAB — MRSA PCR SCREENING: MRSA by PCR: NEGATIVE

## 2019-11-22 LAB — BASIC METABOLIC PANEL
Anion gap: 15 (ref 5–15)
BUN: 21 mg/dL — ABNORMAL HIGH (ref 6–20)
CO2: 21 mmol/L — ABNORMAL LOW (ref 22–32)
Calcium: 8.8 mg/dL — ABNORMAL LOW (ref 8.9–10.3)
Chloride: 101 mmol/L (ref 98–111)
Creatinine, Ser: 2.25 mg/dL — ABNORMAL HIGH (ref 0.44–1.00)
GFR calc Af Amer: 28 mL/min — ABNORMAL LOW (ref >60)
GFR calc non Af Amer: 24 mL/min — ABNORMAL LOW (ref >60)
Glucose, Bld: 127 mg/dL — ABNORMAL HIGH (ref 70–99)
Potassium: 5.4 mmol/L — ABNORMAL HIGH (ref 3.5–5.1)
Sodium: 137 mmol/L (ref 135–145)

## 2019-11-22 LAB — GLUCOSE, CAPILLARY: Glucose-Capillary: 88 mg/dL (ref 70–99)

## 2019-11-22 LAB — CK: Total CK: 271 U/L — ABNORMAL HIGH (ref 38–234)

## 2019-11-22 LAB — SARS CORONAVIRUS 2 BY RT PCR (HOSPITAL ORDER, PERFORMED IN ~~LOC~~ HOSPITAL LAB): SARS Coronavirus 2: NEGATIVE

## 2019-11-22 LAB — LACTIC ACID, PLASMA
Lactic Acid, Venous: 3.1 mmol/L (ref 0.5–1.9)
Lactic Acid, Venous: 1.2 mmol/L (ref 0.5–1.9)

## 2019-11-22 LAB — BRAIN NATRIURETIC PEPTIDE: B Natriuretic Peptide: 857.3 pg/mL — ABNORMAL HIGH (ref 0.0–100.0)

## 2019-11-22 LAB — PROCALCITONIN: Procalcitonin: 0.11 ng/mL

## 2019-11-22 MED ORDER — SODIUM CHLORIDE 0.9% FLUSH
10.0000 mL | Freq: Two times a day (BID) | INTRAVENOUS | Status: DC
  Administered 2019-11-22 – 2019-11-26 (×8): 10 mL
  Administered 2019-11-27: 20 mL
  Administered 2019-11-27: 10 mL

## 2019-11-22 MED ORDER — ONDANSETRON HCL 4 MG/2ML IJ SOLN
4.0000 mg | Freq: Once | INTRAMUSCULAR | Status: AC
  Administered 2019-11-22: 11:00:00 4 mg via INTRAVENOUS
  Filled 2019-11-22: qty 2

## 2019-11-22 MED ORDER — CHLORHEXIDINE GLUCONATE CLOTH 2 % EX PADS
6.0000 | MEDICATED_PAD | Freq: Every day | CUTANEOUS | Status: DC
  Administered 2019-11-23 – 2019-11-27 (×6): 6 via TOPICAL

## 2019-11-22 MED ORDER — SODIUM CHLORIDE 0.9 % IV SOLN
3.0000 g | Freq: Three times a day (TID) | INTRAVENOUS | Status: DC
  Administered 2019-11-22 – 2019-11-23 (×3): 3 g via INTRAVENOUS
  Filled 2019-11-22 (×2): qty 8

## 2019-11-22 MED ORDER — SODIUM CHLORIDE 0.9 % IV SOLN
3.0000 g | Freq: Once | INTRAVENOUS | Status: DC
  Filled 2019-11-22: qty 8

## 2019-11-22 MED ORDER — SODIUM CHLORIDE 0.9% FLUSH: 10.0000 mL | INTRAVENOUS | Status: DC | PRN

## 2019-11-22 MED ORDER — DOCUSATE SODIUM 100 MG PO CAPS
100.0000 mg | ORAL_CAPSULE | Freq: Two times a day (BID) | ORAL | Status: DC | PRN
  Administered 2019-11-27: 100 mg via ORAL
  Filled 2019-11-22: qty 1

## 2019-11-22 MED ORDER — HEPARIN (PORCINE) 25000 UT/250ML-% IV SOLN
1800.0000 [IU]/h | INTRAVENOUS | Status: AC
  Administered 2019-11-22 – 2019-11-23 (×3): 2000 [IU]/h via INTRAVENOUS
  Administered 2019-11-24 – 2019-11-26 (×4): 1700 [IU]/h via INTRAVENOUS
  Filled 2019-11-22 (×7): qty 250

## 2019-11-22 MED ORDER — HEPARIN SODIUM (PORCINE) 5000 UNIT/ML IJ SOLN: 5000.0000 [IU] | Freq: Three times a day (TID) | INTRAMUSCULAR | Status: DC

## 2019-11-22 MED ORDER — NALOXONE HCL 2 MG/2ML IJ SOSY
PREFILLED_SYRINGE | INTRAMUSCULAR | Status: AC
  Filled 2019-11-22: qty 2

## 2019-11-22 MED ORDER — HEPARIN BOLUS VIA INFUSION
5000.0000 [IU] | Freq: Once | INTRAVENOUS | Status: AC
  Administered 2019-11-22: 17:00:00 5000 [IU] via INTRAVENOUS
  Filled 2019-11-22: qty 5000

## 2019-11-22 MED ORDER — POLYETHYLENE GLYCOL 3350 17 G PO PACK
17.0000 g | PACK | Freq: Every day | ORAL | Status: DC | PRN
  Administered 2019-11-23: 12:00:00 17 g via ORAL
  Filled 2019-11-22: qty 1

## 2019-11-22 MED ORDER — ONDANSETRON HCL 4 MG/2ML IJ SOLN
4.0000 mg | Freq: Four times a day (QID) | INTRAMUSCULAR | Status: DC | PRN
  Administered 2019-11-23: 4 mg via INTRAVENOUS
  Filled 2019-11-22: qty 2

## 2019-11-22 MED ORDER — SODIUM CHLORIDE 0.9 % IV SOLN
INTRAVENOUS | Status: DC | PRN
  Administered 2019-11-22 – 2019-11-27 (×2): 1000 mL via INTRAVENOUS

## 2019-11-22 NOTE — Progress Notes (Signed)
Critical ABG results given to ED MD. 

## 2019-11-22 NOTE — Progress Notes (Signed)
PIV consult: Assessed BUE with ultrasound. No suitable vein found for cannulation. Recommend central line for IV access.

## 2019-11-22 NOTE — ED Triage Notes (Signed)
To ED via [facility] from home-ems was called for "fall"-- pt has been on the floor all night without CPap on- on EMS arrival pt O2 sats were 37% on RA- placed on 6L/M/[place] - sats increased to 94%Pt was vomiting on arrival to ED, had vomited at home also-- initially placed on O2 at 6L/m/[place]- sats in low 80%- dr made aware-- placed on NRB at 15L/m/[place]. sats increased to 96%

## 2019-11-22 NOTE — ED Notes (Signed)
CCM midlevel provider at bedside.

## 2019-11-22 NOTE — Progress Notes (Signed)
Pharmacy Antibiotic Note  Angel Brewer is a 54 y.o. female admitted on 11/22/2019 with aspiration pneumonia. Pharmacy has been consulted for unasyn dosing. Pt afebrile and WBC is WNL. Scr elevated well above baseline and lactic acid is elevated.   Plan: Unasyn 3gm IV Q8H F/u renal fxn, C&S, clinical status   No data recorded.  Recent Labs  Lab 11/22/19 1117 11/22/19 1122  WBC  --  9.2  CREATININE 2.30* 2.25*  LATICACIDVEN  --  3.1*    Estimated Creatinine Clearance: 52.3 mL/min (A) (by C-G formula based on SCr of 2.25 mg/dL (H)).    No Known Allergies  Antimicrobials this admission: Unasyn 5/11>>  Dose adjustments this admission: N/A  Microbiology results: Pending  Thank you for allowing pharmacy to be a part of this patient's care.  Rayni Nemitz, Rande Lawman 11/22/2019 2:05 PM

## 2019-11-22 NOTE — ED Notes (Signed)
Pt is vomiting moderate amont liquid and undigested food- dr Ronnald Nian aware- unable to place bipap at present

## 2019-11-22 NOTE — ED Notes (Addendum)
Pt has been off xeralto since Thursday for surgery on wrist- had surgery yesterday on right wrist-  Hx PE 2017,  DrRuthann Cancer at bedside

## 2019-11-22 NOTE — ED Notes (Signed)
No change in mentation from narcan-- pt does open eyes when name is called, but not alert.

## 2019-11-22 NOTE — H&P (Signed)
NAMEOretta Brewer, MRN:  [id number], DOB:  1966/07/04, LOS: 0 ADMISSION DATE:  11/22/2019, CONSULTATION DATE:  5/11 REFERRING MD:  Dr Ronnald Nian , CHIEF COMPLAINT:  Acute hypercarbic resp failure  Brief History   54 yo female with sob and ams found to be hypercarbic and hypoxic.   History of present illness   54 yo aaf with pmh DVT on xarelto since 2017 (held with recent wrist surgery), osa on home cpap presented today after R wrist surgery yesterday and feeling overall weak and confused this am with sob. She endorses some n/v. Niece found her confused at home on the floor.   Pt challenging to arouse but as we were planning for intubation pt awoke and was able to converse. She denies nausea at this time. Stating that she does not remember the events leading to her presentation. She states she does feel tired. She was advised to stay off her xarelto for 10 days post operatively originally holding it since Thursday 5/6.   She denies cp, dizziness but does endorse LE pain and swelling worse than normal. She has been complaining for sob for a week or so per niece. Pt denies cough/fever/chills. No diarrhea but + vomiting last night and this am.    Past Medical History   Past Medical History:  Diagnosis Date  . DVT (deep vein thrombosis) in pregnancy    RLE DVT 02/2016  . Dyspnea   . Morbid obesity ([place])   . PE (pulmonary embolism) 02/29/2016  . Peripheral vascular disease ([place])   . Sleep apnea    uses a cpap    Significant Hospital Events     Consults:    Procedures:  5/10 R wrist surgery  Significant Diagnostic Tests:  5/11 CTPA: unable to do 2/2 renal function 5/11 le venous doppler:  5/11 echo:   Micro Data:  5/11 sars2: pending   Antimicrobials:  unasyn 5/11->  Interim history/subjective:  5/11: admitted to ICU on NIV for now.   Objective   Blood pressure (!) 123/109, pulse 71, resp. rate 15, last menstrual period 11/29/2015, SpO2 98 %.    Vent Mode:  PCV;BIPAP FiO2 (%):  [50 %] 50 % PEEP:  [8 cmH20] 8 cmH20  No intake or output data in the 24 hours ending 11/22/19 1209 There were no vitals filed for this visit.  Examination: General: no acute distress, on NIV and protecting airway. arousable but is drowsy HEENT: NCAT, EOMI, PERRLA, MMMP Lungs: CTA, no wheezes, rhonchi, rales, distant breath sounds 2/2 body habitus Cardiovascular: RRR, no m/g/r Abdomen: obese, soft, NT,ND, BS+ but distant Extremities: + edema and chronic venous stasis changes Skin: no rashes, warm and dry Neuro: moves all 4 extremities, R wrist with splint GU: deferred  Resolved Hospital Problem list     Assessment & Plan:  Acute hypercarbic/hypoxemic resp failure:  -? Aspiration event -did not wear cpap overnight -taking pain medication which could also play a role -may not avoid intuabtion with recent emesis not a great candidate for NIV -titrate oxygen -ekg with s1,q3,t3 pattern ctpa pending -home xarelto held 2/2 surgery yesterday, will need to clarify with ortho risks vs benefits , of course if pt has acute pe will need to start heparin infusion.    Aspiration pneumonia?:  -cxr not c/w this for now -repeat cxr in am and follow, may blossom  -agree with unasyn dosing for now as well -trend pct and wbc/fever curve  H/o pe and LE DVT:  -f/u studies -resume  a/c as soon as possible -pending CTPA may warrant LE venous doppler  OSA:  -niv if no further emesis  HFpEF:  Elevated trop: -some mild pulm congestion noted on cxr c/w acute exacerbation -could stand to have some diuresis but renal function at this time preventing this.  -check bnp -repeat echo -trend trop -ekg without acute st changes  N/v:  -zofran -npo  Arf:  -follow indices -baseline 0.8 -uop as well -renal u/s when more stable from resp standpoint  Lactic acidosis:  -trend values  UPDATE:  Unable to get ctpa 2/2 renal function. Le doppler/echo pending, empiric heparin  gtt to start for suspected PE and elevated trop (trending). Repeat abg in a few hours.   Best practice:  Diet: npo Pain/Anxiety/Delirium protocol (if indicated): n/a VAP protocol (if indicated): n/a DVT prophylaxis: heparin subcut (will reach out to ortho to determine a/c plan) GI prophylaxis: n/a Glucose control: monitor Mobility: bedrest Code Status: Full Family Communication: niece at bedside Disposition: ICU  Labs   CBC: Recent Labs  Lab 11/22/19 1038 11/22/19 1117 11/22/19 1119 11/22/19 1122 11/22/19 1151  WBC  --   --   --  9.2  --   NEUTROABS  --   --   --  6.9  --   HGB 15.3* 15.3* 15.6* 13.6 15.3*  HCT 45.0 45.0 46.0 48.3* 45.0  MCV  --   --   --  97.4  --   PLT  --   --   --  252  --     Basic Metabolic Panel: Recent Labs  Lab 11/22/19 1038 11/22/19 1117 11/22/19 1119 11/22/19 1151  NA 138 138 138 137  K 4.8 5.1 5.1 5.1  CL  --  102  --   --   GLUCOSE  --  123*  --   --   BUN  --  24*  --   --   CREATININE  --  2.30*  --   --    GFR: Estimated Creatinine Clearance: 51.1 mL/min (A) (by C-G formula based on SCr of 2.3 mg/dL (H)). Recent Labs  Lab 11/22/19 1122  WBC 9.2  LATICACIDVEN 3.1*    Liver Function Tests: No results for input(s): AST, ALT, ALKPHOS, BILITOT, PROT, ALBUMIN in the last 168 hours. No results for input(s): LIPASE, AMYLASE in the last 168 hours. No results for input(s): AMMONIA in the last 168 hours.  ABG    Component Value Date/Time   PHART 7.171 (LL) 11/22/2019 1151   PCO2ART 87.5 (HH) 11/22/2019 1151   PO2ART 83 11/22/2019 1151   HCO3 31.9 (H) 11/22/2019 1151   TCO2 35 (H) 11/22/2019 1151   ACIDBASEDEF 3.0 (H) 11/22/2019 1119   O2SAT 92.0 11/22/2019 1151     Coagulation Profile: No results for input(s): INR, PROTIME in the last 168 hours.  Cardiac Enzymes: No results for input(s): CKTOTAL, CKMB, CKMBINDEX, TROPONINI in the last 168 hours.  HbA1C: Hemoglobin A1C  Date/Time Value Ref Range Status  06/10/2016  04:41 PM 5.4  Final    CBG: No results for input(s): GLUCAP in the last 168 hours.  Review of Systems:   As per HPI. Limited 2/2 NIV use and somnolence  Past Medical History  She,  has a past medical history of DVT (deep vein thrombosis) in pregnancy, Dyspnea, Morbid obesity ([place]), PE (pulmonary embolism) (02/29/2016), Peripheral vascular disease ([place]), and Sleep apnea.   Surgical History    Past Surgical History:  Procedure Laterality Date  . CESAREAN  SECTION     x 3  . OPEN REDUCTION INTERNAL FIXATION (ORIF) DISTAL RADIAL FRACTURE Right 06/20/2019   Procedure: OPEN REDUCTION INTERNAL FIXATION (ORIF) DISTAL RADIAL FRACTURE;  Surgeon: [name], MD;  Location: [facility];  Service: Orthopedics;  Laterality: Right;  REGIONAL BLOCK TOTAL SURGERY TIME: 90 MINUTES  . SYNOVECTOMY Right 11/21/2019   Procedure: RIGHT WRIST LIGAMENT RECONSTRUCTION;  Surgeon: [name], MD;  Location: [facility];  Service: Orthopedics;  Laterality: Right;  PROCEDURE: RIGHT WRIST LIGAMENT RECONSTRUCTION LENGTH OF SURGERY: 105 MIN     Social History   reports that she has quit smoking. She has never used smokeless tobacco. She reports that she does not drink alcohol or use drugs.   Family History   Her family history is not on file.   Allergies No Known Allergies   Home Medications  Prior to Admission medications   Medication Sig Start Date End Date Taking? Authorizing Provider  acetaminophen (TYLENOL) 325 MG tablet Take 2 tablets (650 mg total) by mouth every 6 (six) hours. 11/21/19   [name], MD  diclofenac Sodium (VOLTAREN) 1 % GEL Apply 1 application topically 4 (four) times daily as needed (knee pain).    [provider]  oxyCODONE (ROXICODONE) 5 MG immediate release tablet Take 1 tablet (5 mg total) by mouth every 6 (six) hours as needed for severe pain. 11/21/19   [name], MD  rivaroxaban (XARELTO) 20 MG TABS tablet Take 1 tablet (20 mg total) by mouth daily with supper.  11/22/19   [name], MD     Critical care time: The patient is critically ill with multiple organ systems failure and requires high complexity decision making for assessment and support, frequent evaluation and titration of therapies, application of advanced monitoring technologies and extensive interpretation of multiple databases.  Critical care time 43 mins. This represents my time independent of the NP's/PA's/med students/residents time taking care of the pt. This is excluding procedures.     [facility] 11/22/2019, 12:09 PM

## 2019-11-22 NOTE — ED Provider Notes (Signed)
[facility] EMERGENCY DEPARTMENT Provider Note   CSN: [id number] Arrival date & time: 11/22/19  8032     History Chief Complaint  Patient presents with  . Shortness of Breath    hypoxia  . Altered Mental Status    Angel Brewer is a 54 y.o. female.  The history is provided by the patient.  Altered Mental Status Presenting symptoms: confusion and partial responsiveness (per EMS hypoxia in the field, had right wrist surgery yesterday (day surgery and went home)   Severity:  Mild Most recent episode:  Today Episode history:  Single Timing:  Constant Progression:  Unchanged Chronicity:  New Context comment:  Patient with history of DVT/PE on xarleto but held the last few days due to arm surgery yesterday. Wasnt worry CPAP last night, was unable to get up out of bed all night due to weakness and lethargy. Patient did take pain pill and threw up, maybe aspirated Associated symptoms: difficulty breathing   Associated symptoms: no abdominal pain, no depression, no fever, no palpitations, no rash, no seizures and no vomiting        Past Medical History:  Diagnosis Date  . DVT (deep vein thrombosis) in pregnancy    RLE DVT 02/2016  . Dyspnea   . Morbid obesity ([place])   . PE (pulmonary embolism) 02/29/2016  . Peripheral vascular disease ([place])   . Sleep apnea    uses a cpap    Patient Active Problem List   Diagnosis Date Noted  . Acute hypoxemic respiratory failure ([place]) 11/22/2019  . Right wrist fracture 06/14/2019  . Chronic respiratory failure with hypoxia ([place]) 06/13/2019  . Essential hypertension 08/25/2017  . OSA on CPAP 02/03/2017  . Morbid obesity ([place]) 10/29/2016  . Vitamin D deficiency 10/29/2016    Past Surgical History:  Procedure Laterality Date  . CESAREAN SECTION     x 3  . OPEN REDUCTION INTERNAL FIXATION (ORIF) DISTAL RADIAL FRACTURE Right 06/20/2019   Procedure: OPEN REDUCTION INTERNAL FIXATION (ORIF) DISTAL RADIAL FRACTURE;  Surgeon:  [name], MD;  Location: [facility];  Service: Orthopedics;  Laterality: Right;  REGIONAL BLOCK TOTAL SURGERY TIME: 90 MINUTES  . SYNOVECTOMY Right 11/21/2019   Procedure: RIGHT WRIST LIGAMENT RECONSTRUCTION;  Surgeon: [name], MD;  Location: [facility];  Service: Orthopedics;  Laterality: Right;  PROCEDURE: RIGHT WRIST LIGAMENT RECONSTRUCTION LENGTH OF SURGERY: 105 MIN     OB History   No obstetric history on file.     No family history on file.  Social History   Tobacco Use  . Smoking status: Former Research scientist (life sciences)  . Smokeless tobacco: Never Used  . Tobacco comment: smoked only as a teenager  Substance Use Topics  . Alcohol use: No  . Drug use: No    Home Medications Prior to Admission medications   Medication Sig Start Date End Date Taking? Authorizing Provider  acetaminophen (TYLENOL) 325 MG tablet Take 2 tablets (650 mg total) by mouth every 6 (six) hours. 11/21/19   [name], MD  diclofenac Sodium (VOLTAREN) 1 % GEL Apply 1 application topically 4 (four) times daily as needed (knee pain).    [provider]  oxyCODONE (ROXICODONE) 5 MG immediate release tablet Take 1 tablet (5 mg total) by mouth every 6 (six) hours as needed for severe pain. 11/21/19   [name], MD  rivaroxaban (XARELTO) 20 MG TABS tablet Take 1 tablet (20 mg total) by mouth daily with supper. 11/22/19   [name], MD  Allergies    Patient has no known allergies.  Review of Systems   Review of Systems  Constitutional: Negative for chills and fever.  HENT: Negative for ear pain and sore throat.   Eyes: Negative for pain and visual disturbance.  Respiratory: Negative for cough and shortness of breath.   Cardiovascular: Negative for chest pain and palpitations.  Gastrointestinal: Negative for abdominal pain and vomiting.  Genitourinary: Negative for dysuria and hematuria.  Musculoskeletal: Negative for arthralgias and back pain.  Skin: Negative for color change and rash.    Neurological: Negative for seizures and syncope.  Psychiatric/Behavioral: Positive for confusion.  All other systems reviewed and are negative.   Physical Exam Updated Vital Signs  ED Triage Vitals [11/22/19 1000]  Enc Vitals Group     BP (!) 116/94     Pulse Rate 77     Resp 16     Temp      Temp src      SpO2 90 %     Weight      Height      Head Circumference      Peak Flow      Pain Score      Pain Loc      Pain Edu?      Excl. in [place]?     Physical Exam Vitals and nursing note reviewed.  Constitutional:      General: She is not in acute distress.    Appearance: She is well-developed. She is not ill-appearing.  HENT:     Head: Normocephalic and atraumatic.     Mouth/Throat:     Mouth: Mucous membranes are moist.  Eyes:     Conjunctiva/sclera: Conjunctivae normal.     Pupils: Pupils are equal, round, and reactive to light.  Cardiovascular:     Rate and Rhythm: Normal rate and regular rhythm.     Pulses: Normal pulses.     Heart sounds: Normal heart sounds. No murmur.  Pulmonary:     Effort: Pulmonary effort is normal. No respiratory distress.     Breath sounds: Decreased breath sounds (coarse breath sounds throughout) present.  Abdominal:     Palpations: Abdomen is soft.     Tenderness: There is no abdominal tenderness.  Musculoskeletal:        General: Normal range of motion.     Cervical back: Normal range of motion and neck supple.     Right lower leg: Edema (chronic venous stasis changes) present.     Left lower leg: Edema (chronic venous stasis changes) present.  Skin:    General: Skin is warm and dry.     Capillary Refill: Capillary refill takes less than 2 seconds.  Neurological:     General: No focal deficit present.     Mental Status: She is alert.     Comments: Somnolent but easily arousable   Psychiatric:        Mood and Affect: Mood normal.     ED Results / Procedures / Treatments   Labs (all labs ordered are listed, but only abnormal  results are displayed) Labs Reviewed  CBC WITH DIFFERENTIAL/PLATELET - Abnormal; Notable for the following components:      Result Value   HCT 48.3 (*)    MCHC 28.2 (*)    nRBC 0.7 (*)    Monocytes Absolute 1.1 (*)    Abs Immature Granulocytes 0.14 (*)    All other components within normal limits  BASIC METABOLIC PANEL - Abnormal;  Notable for the following components:   Potassium 5.4 (*)    CO2 21 (*)    Glucose, Bld 127 (*)    BUN 21 (*)    Creatinine, Ser 2.25 (*)    Calcium 8.8 (*)    GFR calc non Af Amer 24 (*)    GFR calc Af Amer 28 (*)    All other components within normal limits  HEPATIC FUNCTION PANEL - Abnormal; Notable for the following components:   AST 115 (*)    ALT 54 (*)    Bilirubin, Direct 0.4 (*)    All other components within normal limits  LACTIC ACID, PLASMA - Abnormal; Notable for the following components:   Lactic Acid, Venous 3.1 (*)    All other components within normal limits  BRAIN NATRIURETIC PEPTIDE - Abnormal; Notable for the following components:   B Natriuretic Peptide 857.3 (*)    All other components within normal limits  CK - Abnormal; Notable for the following components:   Total CK 271 (*)    All other components within normal limits  POCT I-STAT 7, (LYTES, BLD GAS, ICA,H+H) - Abnormal; Notable for the following components:   pH, Arterial 7.120 (*)    pCO2 arterial 95.0 (*)    Bicarbonate 30.9 (*)    TCO2 34 (*)    Hemoglobin 15.3 (*)    All other components within normal limits  I-STAT CHEM 8, ED - Abnormal; Notable for the following components:   BUN 24 (*)    Creatinine, Ser 2.30 (*)    Glucose, Bld 123 (*)    Calcium, Ion 1.04 (*)    Hemoglobin 15.3 (*)    All other components within normal limits  POCT I-STAT EG7 - Abnormal; Notable for the following components:   pH, Ven 7.241 (*)    pCO2, Ven 60.6 (*)    pO2, Ven 82.0 (*)    Acid-base deficit 3.0 (*)    Calcium, Ion 1.05 (*)    Hemoglobin 15.6 (*)    All other  components within normal limits  POCT I-STAT 7, (LYTES, BLD GAS, ICA,H+H) - Abnormal; Notable for the following components:   pH, Arterial 7.171 (*)    pCO2 arterial 87.5 (*)    Bicarbonate 31.9 (*)    TCO2 35 (*)    Hemoglobin 15.3 (*)    All other components within normal limits  TROPONIN I (HIGH SENSITIVITY) - Abnormal; Notable for the following components:   Troponin I (High Sensitivity) 457 (*)    All other components within normal limits  SARS CORONAVIRUS 2 BY RT PCR (HOSPITAL ORDER, [facility])  CULTURE, BLOOD (ROUTINE X 2)  CULTURE, BLOOD (ROUTINE X 2)  BLOOD GAS, VENOUS  LACTIC ACID, PLASMA  CBC  CREATININE, SERUM  PROCALCITONIN  BRAIN NATRIURETIC PEPTIDE  URINALYSIS, ROUTINE W REFLEX MICROSCOPIC    EKG  Sinus rhythm, no ischemic changes, unremarkable intervals  Radiology DG Wrist 2 Views Right  Result Date: 11/21/2019 CLINICAL DATA:  Right wrist ligament reconstruction EXAM: DG C-ARM 1-60 MIN; RIGHT WRIST - 2 VIEW COMPARISON:  06/20/2019 FINDINGS: Plate and screw fixation device again noted in the distal right radius, unchanged. Wire placement within the carpal region through the scaphoid into the capitate. IMPRESSION: Intraoperative imaging as above. Electronically Signed   By: Rolm Baptise M.D.   On: 11/21/2019 09:04   DG Chest Portable 1 View  Result Date: 11/22/2019 CLINICAL DATA:  Hypoxia.  Wrist surgery yesterday EXAM: PORTABLE  CHEST 1 VIEW COMPARISON:  Chest CT 06/13/2019 FINDINGS: Cardiomegaly and vascular pedicle widening with central venous congestion. No Kerley lines, effusion, or air leak. IMPRESSION: Cardiomegaly and vascular congestion. Electronically Signed   By: Monte Fantasia M.D.   On: 11/22/2019 10:36   DG C-Arm 1-60 Min  Result Date: 11/21/2019 CLINICAL DATA:  Right wrist ligament reconstruction EXAM: DG C-ARM 1-60 MIN; RIGHT WRIST - 2 VIEW COMPARISON:  06/20/2019 FINDINGS: Plate and screw fixation device again noted in  the distal right radius, unchanged. Wire placement within the carpal region through the scaphoid into the capitate. IMPRESSION: Intraoperative imaging as above. Electronically Signed   By: Rolm Baptise M.D.   On: 11/21/2019 09:04    Procedures .Critical Care Performed by: Lennice Sites, DO Authorized by: Lennice Sites, DO   Critical care provider statement:    Critical care time (minutes):  20   Critical care was necessary to treat or prevent imminent or life-threatening deterioration of the following conditions:  Respiratory failure   Critical care was time spent personally by me on the following activities:  Blood draw for specimens, development of treatment plan with patient or surrogate, discussions with primary provider, evaluation of patient's response to treatment, examination of patient, obtaining history from patient or surrogate, ordering and performing treatments and interventions, ordering and review of laboratory studies, ordering and review of radiographic studies, pulse oximetry, re-evaluation of patient's condition and review of old charts   I assumed direction of critical care for this patient from another provider in my specialty: no     (including critical care time)  Medications Ordered in ED Medications  Ampicillin-Sulbactam (UNASYN) 3 g in sodium chloride 0.9 % 100 mL IVPB (has no administration in time range)  docusate sodium (COLACE) capsule 100 mg (has no administration in time range)  polyethylene glycol (MIRALAX / GLYCOLAX) packet 17 g (has no administration in time range)  ondansetron (ZOFRAN) injection 4 mg (has no administration in time range)  ondansetron (ZOFRAN) injection 4 mg (4 mg Intravenous Given 11/22/19 1054)  naloxone (NARCAN) 2 MG/2ML injection ( Intravenous Given 11/22/19 1120)    ED Course  I have reviewed the triage vital signs and the nursing notes.  Pertinent labs & imaging results that were available during my care of the patient were  reviewed by me and considered in my medical decision making (see chart for details).    MDM Rules/Calculators/A&P                      Evora Perryman is a 54 year old female with history of morbid obesity, PE on Xarelto, sleep apnea who presents to the ED with altered mental status, respiratory failure.  Patient hypoxic in the 50s upon arrival.  Patient with improvement of oxygen on nonrebreather and was eventually transitioned to BiPAP.  Blood pressure normal.  No tachycardia.  Patient did have day surgery on her right hand and wrist yesterday.  This was done via nerve block.  She was given a prescription for narcotic pain medicine but only took 1 dose last night and made her throw up and sick.  Narcan had no improvement of her mental status of breathing.  Patient with blood gases shows a pH of 7.1, CO2 of 90.  Patient with hypercarbic hypoxic respiratory failure.  Has been off his Xarelto for the last several days due to having surgery yesterday.  Possible PE as the cause.  Given that she had nausea and vomiting and possible aspiration.  Possible obesity hypoventilation syndrome.  Lab work was initiated.  Empirically given IV Unasyn for possible aspiration pneumonia.  ICU consulted given respiratory compromise.  However she has had some improvement with BiPAP and her mentation has improved.  Will hold on any intubation at this time.  No significant leukocytosis or anemia.  Patient does have AKI.  Troponin mildly elevated to 457.  BNP elevated 857.  Chest x-ray with some cardiomegaly but no obvious pneumonia.  Possible slight edema.  Echocardiogram done 2 years ago shows an EF of 60.  Patient not on Lasix.  After discussion with both ICU and orthopedic surgeon Dr. Grandville Silos will empirically start IV heparin as unable to get PE scan due to AKI.  Differential remains the same, PE versus aspiration pneumonia versus obesity hypoventilation.  Patient to be admitted to ICU.  Continue to be on BiPAP.  Repeat blood  gas slightly improved however mental status still stable and will hold off in an inpatient at this time.  This chart was dictated using voice recognition software.  Despite best efforts to proofread,  errors can occur which can change the documentation meaning.    Final Clinical Impression(s) / ED Diagnoses Final diagnoses:  Acute respiratory failure with hypoxia ([place])  Altered mental status, unspecified altered mental status type  AKI (acute kidney injury) [facility])    Rx / Rodney Orders ED Discharge Orders    None       Lennice Sites, DO 11/22/19 1321

## 2019-11-22 NOTE — Progress Notes (Addendum)
Pt admitted from ED per stretcher to 2M13. Pt on bipap at this time.Placed on Baribed for comfort.

## 2019-11-22 NOTE — Procedures (Signed)
Central Venous Catheter Insertion Procedure Note Angel Brewer [id number] Nov 14, 1965  Procedure: Insertion of Central Venous Catheter Indications: Assessment of intravascular volume, Drug and/or fluid administration and Frequent blood sampling  Procedure Details Consent: Risks of procedure as well as the alternatives and risks of each were explained to the (patient/caregiver).  Consent for procedure obtained. Time Out: Verified patient identification, verified procedure, site/side was marked, verified correct patient position, special equipment/implants available, medications/allergies/relevent history reviewed, required imaging and test results available.  Performed  Maximum sterile technique was used including antiseptics, cap, gloves, gown, hand hygiene, mask and sheet. Skin prep: Chlorhexidine; local anesthetic administered A antimicrobial bonded/coated triple lumen catheter was placed in the left internal jugular vein using the Seldinger technique wit the assistance of Noe Gens NP.  Evaluation Blood flow good Complications: No apparent complications Patient did tolerate procedure well. Chest X-ray ordered to verify placement.  CXR: pending.  Johnsie Cancel, NP-C  Pulmonary & Critical Care Contact / Pager information can be found on Amion  11/22/2019, 4:20 PM

## 2019-11-22 NOTE — Progress Notes (Signed)
Critical ABG results given to ED MD.

## 2019-11-22 NOTE — Progress Notes (Signed)
ANTICOAGULATION CONSULT NOTE - Initial Consult  Pharmacy Consult for heparin Indication: r/o acute PE, hx DVT/PE  No Known Allergies  Patient Measurements:   Heparin Dosing Weight: 114kg  Vital Signs: BP: 103/50 (05/11 1235) Pulse Rate: 72 (05/11 1255)  Labs: Recent Labs    11/22/19 1117 11/22/19 1117 11/22/19 1119 11/22/19 1119 11/22/19 1122 11/22/19 1151  HGB 15.3*   < > 15.6*   < > 13.6 15.3*  HCT 45.0   < > 46.0  --  48.3* 45.0  PLT  --   --   --   --  252  --   CREATININE 2.30*  --   --   --  2.25*  --   CKTOTAL  --   --   --   --  271*  --   TROPONINIHS  --   --   --   --  457*  --    < > = values in this interval not displayed.    Estimated Creatinine Clearance: 52.3 mL/min (A) (by C-G formula based on SCr of 2.25 mg/dL (H)).   Medical History: Past Medical History:  Diagnosis Date  . DVT (deep vein thrombosis) in pregnancy    RLE DVT 02/2016  . Dyspnea   . Morbid obesity ([place])   . PE (pulmonary embolism) 02/29/2016  . Peripheral vascular disease ([place])   . Sleep apnea    uses a cpap    Medications:  Infusions:  . ampicillin-sulbactam (UNASYN) IV    . heparin      Assessment: 35 yof presented to the ED with SOB and AMS. S/p wrist surgery 5/10. She is on chronic xarelto for history of VTE but she has been on this since 5/6 for her surgery. Baseline CBC is WNL. Troponin elevated. To transition to IV heparin.   Goal of Therapy:  Heparin level 0.3-0.7 units/ml Monitor platelets by anticoagulation protocol: Yes   Plan:  Heparin bolus 5000 units IV x 1 - conservative bolus with surgery yesterday Heparin gtt 2000 units/hr Check a 6 hr HL, will also check aPTT x1 to make sure xarelto has cleared with elevated Scr Daily HL and CBC  Eural Holzschuh, Rande Lawman 11/22/2019,1:25 PM

## 2019-11-23 ENCOUNTER — Inpatient Hospital Stay (HOSPITAL_COMMUNITY): Payer: Medicaid Other

## 2019-11-23 DIAGNOSIS — M7989 Other specified soft tissue disorders: Secondary | ICD-10-CM

## 2019-11-23 DIAGNOSIS — I5030 Unspecified diastolic (congestive) heart failure: Secondary | ICD-10-CM

## 2019-11-23 LAB — CBC
HCT: 42.9 % (ref 36.0–46.0)
Hemoglobin: 12.4 g/dL (ref 12.0–15.0)
MCH: 27.6 pg (ref 26.0–34.0)
MCHC: 28.9 g/dL — ABNORMAL LOW (ref 30.0–36.0)
MCV: 95.3 fL (ref 80.0–100.0)
Platelets: 201 10*3/uL (ref 150–400)
RBC: 4.5 MIL/uL (ref 3.87–5.11)
RDW: 15.6 % — ABNORMAL HIGH (ref 11.5–15.5)
WBC: 8 10*3/uL (ref 4.0–10.5)
nRBC: 1.1 % — ABNORMAL HIGH (ref 0.0–0.2)

## 2019-11-23 LAB — URINALYSIS, ROUTINE W REFLEX MICROSCOPIC
Bilirubin Urine: NEGATIVE
Glucose, UA: NEGATIVE mg/dL
Hgb urine dipstick: NEGATIVE
Ketones, ur: NEGATIVE mg/dL
Leukocytes,Ua: NEGATIVE
Nitrite: NEGATIVE
Protein, ur: 100 mg/dL — AB
Specific Gravity, Urine: 1.027 (ref 1.005–1.030)
pH: 5 (ref 5.0–8.0)

## 2019-11-23 LAB — HEPARIN LEVEL (UNFRACTIONATED): Heparin Unfractionated: 0.6 IU/mL (ref 0.30–0.70)

## 2019-11-23 LAB — BASIC METABOLIC PANEL
Anion gap: 8 (ref 5–15)
Anion gap: 11 (ref 5–15)
BUN: 22 mg/dL — ABNORMAL HIGH (ref 6–20)
BUN: 23 mg/dL — ABNORMAL HIGH (ref 6–20)
CO2: 28 mmol/L (ref 22–32)
CO2: 30 mmol/L (ref 22–32)
Calcium: 8.5 mg/dL — ABNORMAL LOW (ref 8.9–10.3)
Calcium: 8.6 mg/dL — ABNORMAL LOW (ref 8.9–10.3)
Chloride: 103 mmol/L (ref 98–111)
Chloride: 100 mmol/L (ref 98–111)
Creatinine, Ser: 1.62 mg/dL — ABNORMAL HIGH (ref 0.44–1.00)
Creatinine, Ser: 1.31 mg/dL — ABNORMAL HIGH (ref 0.44–1.00)
GFR calc Af Amer: 41 mL/min — ABNORMAL LOW (ref >60)
GFR calc Af Amer: 53 mL/min — ABNORMAL LOW (ref >60)
GFR calc non Af Amer: 36 mL/min — ABNORMAL LOW (ref >60)
GFR calc non Af Amer: 46 mL/min — ABNORMAL LOW (ref >60)
Glucose, Bld: 96 mg/dL (ref 70–99)
Glucose, Bld: 95 mg/dL (ref 70–99)
Potassium: 4.8 mmol/L (ref 3.5–5.1)
Potassium: 4.9 mmol/L (ref 3.5–5.1)
Sodium: 139 mmol/L (ref 135–145)
Sodium: 141 mmol/L (ref 135–145)

## 2019-11-23 LAB — MAGNESIUM: Magnesium: 2.2 mg/dL (ref 1.7–2.4)

## 2019-11-23 LAB — PHOSPHORUS: Phosphorus: 4.5 mg/dL (ref 2.5–4.6)

## 2019-11-23 LAB — GLUCOSE, CAPILLARY
Glucose-Capillary: 61 mg/dL — ABNORMAL LOW (ref 70–99)
Glucose-Capillary: 79 mg/dL (ref 70–99)

## 2019-11-23 LAB — ECHOCARDIOGRAM COMPLETE
Height: 68 in
Weight: 6464 oz

## 2019-11-23 LAB — APTT: aPTT: >200 seconds (ref 24–36)

## 2019-11-23 LAB — CREATININE, URINE, RANDOM: Creatinine, Urine: 169.75 mg/dL

## 2019-11-23 LAB — SODIUM, URINE, RANDOM: Sodium, Ur: 74 mmol/L

## 2019-11-23 MED ORDER — DEXTROSE 10 % IV SOLN: INTRAVENOUS | Status: DC

## 2019-11-23 MED ORDER — ORAL CARE MOUTH RINSE
15.0000 mL | Freq: Two times a day (BID) | OROMUCOSAL | Status: DC
  Administered 2019-11-23 – 2019-11-27 (×6): 15 mL via OROMUCOSAL

## 2019-11-23 MED ORDER — FUROSEMIDE 10 MG/ML IJ SOLN
40.0000 mg | Freq: Four times a day (QID) | INTRAMUSCULAR | Status: AC
  Administered 2019-11-23 (×2): 40 mg via INTRAVENOUS
  Filled 2019-11-23 (×2): qty 4

## 2019-11-23 MED ORDER — CHLORHEXIDINE GLUCONATE 0.12 % MT SOLN
15.0000 mL | Freq: Two times a day (BID) | OROMUCOSAL | Status: DC
  Administered 2019-11-23 – 2019-11-27 (×6): 15 mL via OROMUCOSAL
  Filled 2019-11-23 (×2): qty 15

## 2019-11-23 MED ORDER — SODIUM CHLORIDE 0.9 % IV SOLN
3.0000 g | Freq: Four times a day (QID) | INTRAVENOUS | Status: DC
  Administered 2019-11-23 – 2019-11-26 (×12): 3 g via INTRAVENOUS
  Filled 2019-11-23 (×12): qty 8

## 2019-11-23 MED ORDER — DEXTROSE 50 % IV SOLN
INTRAVENOUS | Status: AC
  Administered 2019-11-23: 25 mL
  Filled 2019-11-23: qty 50

## 2019-11-23 NOTE — Progress Notes (Signed)
ANTICOAGULATION CONSULT NOTE Pharmacy Consult for Heparin Indication: r/o acute PE, hx DVT/PE  No Known Allergies  Patient Measurements: Height: _0  (172.7 cm) Weight: (!) 191.4 kg (422 lb) IBW/kg (Calculated) : 63.9 Heparin Dosing Weight: 114kg  Vital Signs: Temp: 97.8 F (36.6 C) (05/12 0022) Temp Source: Axillary (05/12 0022) BP: 122/85 (05/12 0000) Pulse Rate: 63 (05/12 0000)  Labs: Recent Labs    11/22/19 1117 11/22/19 1117 11/22/19 1119 11/22/19 1119 11/22/19 1122 11/22/19 1122 11/22/19 1151 11/22/19 1642 11/22/19 1659 11/22/19 2150 11/22/19 2356  HGB 15.3*   < > 15.6*   < > 13.6  --  15.3*  --   --   --   --   HCT 45.0   < > 46.0  --  48.3*  --  45.0  --   --   --   --   PLT  --   --   --   --  252  --   --   --   --   --   --   APTT  --   --   --   --   --   --   --   --   --   --  >200*  HEPARINUNFRC  --   --   --   --   --   --   --   --   --   --  0.60  CREATININE 2.30*  --   --   --  2.25*  --   --   --   --   --   --   CKTOTAL  --   --   --   --  271*  --   --   --   --   --   --   TROPONINIHS  --   --   --   --  457*   < >  --  1,072* 1,115* 910*  --    < > = values in this interval not displayed.    Estimated Creatinine Clearance: 51.8 mL/min (A) (by C-G formula based on SCr of 2.25 mg/dL (H)).   Medical History: Past Medical History:  Diagnosis Date  . DVT (deep vein thrombosis) in pregnancy    RLE DVT 02/2016  . Dyspnea   . Morbid obesity ([place])   . PE (pulmonary embolism) 02/29/2016  . Peripheral vascular disease ([place])   . Sleep apnea    uses a cpap    Medications:  Infusions:  . sodium chloride 10 mL/hr at 11/23/19 0000  . ampicillin-sulbactam (UNASYN) IV Stopped (11/22/19 2246)  . heparin 2,000 Units/hr (11/23/19 0000)    Assessment: 21 yof presented to the ED with SOB and AMS. S/p wrist surgery 5/10. She is on chronic xarelto for history of VTE but she has been on this since 5/6 for her surgery. Baseline CBC is WNL. Troponin  elevated. To transition to IV heparin.   5/12 AM update:  Heparin level within therapeutic range Will DC aPTT monitoring  Goal of Therapy:  Heparin level 0.3-0.7 units/ml Monitor platelets by anticoagulation protocol: Yes   Plan:  Cont heparin at 2000 units/hr Confirmatory heparin level with AM labs  Narda Bonds, PharmD, [place] Pharmacist Phone: [phone]

## 2019-11-23 NOTE — Progress Notes (Signed)
eLink Physician-Brief Progress Note Patient Name: Angel Brewer DOB: 03-09-1966 MRN: [id number]   Date of Service  11/23/2019  HPI/Events of Note  Hypoglycemia - Blood glucose = 61. Tube feeds on hold d/t nausea.   eICU Interventions  Will order: 1. D10W to run IV at 30 mL/hour.      Intervention Category Major Interventions: Other:  [name] 11/23/2019, 8:09 PM

## 2019-11-23 NOTE — Progress Notes (Signed)
PULMONARY / CRITICAL CARE MEDICINE   NAME:  Angel Brewer, MRN:  [id number], DOB:  [birth year], LOS: 1 ADMISSION DATE:  11/22/2019, CONSULTATION DATE:  11/22/19 REFERRING MD:  Dr. Ronnald Nian, CHIEF COMPLAINT:  Acute hypercarbic respiratory failure  BRIEF HISTORY:    53 yo female with PMH of DVT and PE on Xarelto (2017) and OSA on home CPAP presented with SOB and AMS. She had right wrist surgery on 5/10 and had stopped her Xarelto on 5/6 in preparation for surgery. She was advised to remain off Xarelto for 10 days post-op. She had been experiencing SOB and chest pain the days leading up to her surgery. She was found at home confused and on the floor by her niece.  SIGNIFICANT PAST MEDICAL HISTORY     DVT (deep vein thrombosis) in pregnancy    RLE DVT 02/2016  . Dyspnea   . Morbid obesity ([place])   . PE (pulmonary embolism) 02/29/2016  . Peripheral vascular disease ([place])   . Sleep apnea    uses a cpap     SIGNIFICANT EVENTS:  5/11: CVC placed  STUDIES:   EKG 5/11: S1Q3T3  CXR Dec 05, 2022: Stable cardiomegaly and pulmonary vascular congestion  LE Doppler 2022/12/05: LEFT: Findings consistent with acute deep vein thrombosis involving the left posterior tibial veins, and left peroneal veins. RIGHT: No evidence of DVT   CULTURES:  Blood culture: NGTD  ANTIBIOTICS:  Unasyn 5/11 -> present  LINES/TUBES:  CVC  CONSULTANTS:    SUBJECTIVE:  Pt feels a Ryther better this morning. She is alert and oriented. Able to use bedside commode this morning.  CONSTITUTIONAL: BP (!) 99/47   Pulse 61   Temp 98 F (36.7 C) (Oral)   Resp 14   Ht _0  (1.727 m)   Wt (!) 183.3 kg   LMP 11/29/2015   SpO2 100%   BMI 61.43 kg/m   I/O last 3 completed shifts: In: 740 [I.V.:447.4; IV Piggyback:292.5] Out: 75 [Urine:75]     Vent Mode: BIPAP;PCV FiO2 (%):  [40 %-50 %] 40 % Set Rate:  [28 bmp] 28 bmp PEEP:  [8 cmH20-10 cmH20] 10 cmH20  PHYSICAL EXAM: General: morbidly obese, resting comfortably  in bed on BiPAP Neuro: drowsy but awakens easily, able to follow commands and answer questions HEENT:  NCAT, OP clear, NIV in place Cardiovascular:  RRR, no m/g/r, +edema BLE Lungs: CTAB Abdomen: soft, nontender, nonndistended, BS+ Musculoskeletal:  Normal bulk and tone Skin: no rash  RESOLVED PROBLEM LIST  None  ASSESSMENT AND PLAN   Pt is a 54yo female with h/o DVT with PE (2017) on Xarelto at home and OSA on home CPAP who presented with acute hypercarbic respiratory failure. Xarelto has been held since 5/6 for surgery. She was found on the floor at home with confusion and episodes of emesis. Concern for aspiration event vs PE vs acute decompensated HF. Of note, she did not wear CPAP that night but has been taking pain medication for recent R wrist surgery. However, CXR not currently concerning revealing consolidation and vitals/labs not indicative of infection. EKG revealed S1Q3T3 pattern, which in the setting of discontinuing Xarelto and LE doppler revealing DVT is highly suggestive of PE. She also had elevated troponin, BNP, CK, AST/ALT and creatinine on admission.  Acute hypercarbic respiratory failure Acute hypercarbic encephalopathy - PE vs. Aspiration event vs. Decompensated HF - LE Doppler revealed DVT and EKG revealed S1Q3T3 pattern. Pt started on heparin for suspected PE. CT angio not performed d/t elevated creatinine, but  no longer needed as this would not change management. - Echo pending  - On Unasyn for possible aspiration PNA. Will continue dosing for now. Trend CBC/temp. - Continue BIPAP, hold sedation. Take breaks from BIPAP for meals today  AKI - UA consistent with ATN, neg for infection - Pt appears volume overloaded. Will order 19m lasix and monitor carefully with BMP - Will also order urine sodium and creatinine    SUMMARY OF TODAY'S PLAN:  Will initiate Lasix for volume overload. Will trial off BiPAP intermittently today. Will order PT consult and recheck labs  in am.  Best Practice / Goals of Care / Disposition.   DVT PROPHYLAXIS: Heparin NUTRITION: NPO MOBILITY: Bedrest Glucose control: monitor CODE STATUS: full FAMILY DISCUSSIONS: will update familly DISPOSITION: ICU  LABS  Glucose Recent Labs  Lab 11/22/19 2032  GLUCAP 88    BMET Recent Labs  Lab 11/22/19 1117 11/22/19 1119 11/22/19 1122 11/22/19 1151 11/23/19 0344  NA 138   < > 137 137 139  K 5.1   < > 5.4* 5.1 4.8  CL 102  --  101  --  103  CO2  --   --  21*  --  28  BUN 24*  --  21*  --  22*  CREATININE 2.30*  --  2.25*  --  1.62*  GLUCOSE 123*  --  127*  --  96   < > = values in this interval not displayed.    Liver Enzymes Recent Labs  Lab 11/22/19 1122  AST 115*  ALT 54*  ALKPHOS 53  BILITOT 1.1  ALBUMIN 3.7    Electrolytes Recent Labs  Lab 11/22/19 1122 11/23/19 0344  CALCIUM 8.8* 8.5*  MG  --  2.2  PHOS  --  4.5    CBC Recent Labs  Lab 11/22/19 1122 11/22/19 1151 11/23/19 0344  WBC 9.2  --  8.0  HGB 13.6 15.3* 12.4  HCT 48.3* 45.0 42.9  PLT 252  --  201    ABG Recent Labs  Lab 11/22/19 1038 11/22/19 1151  PHART 7.120* 7.171*  PCO2ART 95.0* 87.5*  PO2ART 106 83    Coag's Recent Labs  Lab 11/22/19 2356  APTT >200*    Sepsis Markers Recent Labs  Lab 11/22/19 1122 11/22/19 1642 11/22/19 1659  LATICACIDVEN 3.1* 1.2  --   PROCALCITON  --   --  0.11    Cardiac Enzymes No results for input(s): TROPONINI, PROBNP in the last 168 hours.  PAST MEDICAL HISTORY :   She  has a past medical history of DVT (deep vein thrombosis) in pregnancy, Dyspnea, Morbid [place], PE (pulmonary embolism) (02/29/2016), Peripheral vascular disease ([place], and Sleep apnea.  PAST SURGICAL HISTORY:  She  has a past surgical history that includes Cesarean section; Open reduction internal fixation (orif) distal radial fracture (Right, 06/20/2019); and Synovectomy (Right, 11/21/2019).  No Known Allergies  No current facility-administered  medications on file prior to encounter.   Current Outpatient Medications on File Prior to Encounter  Medication Sig  . oxyCODONE (ROXICODONE) 5 MG immediate release tablet Take 1 tablet (5 mg total) by mouth every 6 (six) hours as needed for severe pain.  .Marland KitchenPRESCRIPTION MEDICATION CPAP- At bedtime  . acetaminophen (TYLENOL) 325 MG tablet Take 2 tablets (650 mg total) by mouth every 6 (six) hours. (Patient not taking: Reported on 11/22/2019)  . diclofenac Sodium (VOLTAREN) 1 % GEL Apply 2-4 g topically 4 (four) times daily as needed (knee pain).   .Marland Kitchen  rivaroxaban (XARELTO) 20 MG TABS tablet Take 1 tablet (20 mg total) by mouth daily with supper.    FAMILY HISTORY:   Her family history is not on file.  SOCIAL HISTORY:  She  reports that she has quit smoking. She has never used smokeless tobacco. She reports that she does not drink alcohol or use drugs.

## 2019-11-23 NOTE — Progress Notes (Signed)
Pharmacy Antibiotic Note  Angel Brewer is a 54 y.o. female admitted on 11/22/2019 with aspiration pneumonia. Pharmacy has been consulted for unasyn dosing. Pt afebrile and WBC is WNL. SCr is trending back down at 1.62 (baseline 0.8). Normalized CrCl ~54 mL/min.   Plan: Increase Unasyn 3gm IV every 6 hours.  F/u renal fxn, C&S, clinical statusHeight: _0  (172.7 cm) Weight: (!) 183.3 kg (404 lb) IBW/kg (Calculated) : 63.9  Temp (24hrs), Avg:97.8 F (36.6 C), Min:97.5 F (36.4 C), Max:98 F (36.7 C)  Recent Labs  Lab 11/22/19 1117 11/22/19 1122 11/22/19 1642 11/23/19 0344  WBC  --  9.2  --  8.0  CREATININE 2.30* 2.25*  --  1.62*  LATICACIDVEN  --  3.1* 1.2  --     Estimated Creatinine Clearance: 70 mL/min (A) (by C-G formula based on SCr of 1.62 mg/dL (H)).    No Known Allergies  Antimicrobials this admission: Unasyn 5/11>>  Dose adjustments this admission: N/A  Microbiology results: Pending  Thank you for allowing pharmacy to be a part of this patient's care.  Brain Hilts 11/23/2019 11:23 AM

## 2019-11-23 NOTE — Progress Notes (Signed)
[facility] P[facility]  S: feels a Hitz better this morning, more awake and alert today, was up to bedside commode earlier today  O: Vitals:   11/23/19 0700 11/23/19 0730 11/23/19 0800 11/23/19 0900  BP: (!) 145/126 110/80  (!) 99/47  Pulse: 69 69 (!) 57 61  Resp: 17 (!) _0 Temp:      TempSrc:      SpO2: 100% 98% 99% 100%  Weight:      Height:       Vent Mode: BIPAP;PCV FiO2 (%):  [40 %-50 %] 40 % Set Rate:  [28 bmp] 28 bmp PEEP:  [8 cmH20-10 cmH20] 10 cmH20   Intake/Output Summary (Last 24 hours) at 11/23/2019 1011 Last data filed at 11/23/2019 0900 Gross per 24 hour  Intake 807.24 ml  Output 250 ml  Net 557.24 ml   General: Morbidly obese, resting comfortably in bed HENT: NCAT OP clear, NIMV in place PULM: CTA B, normal effort CV: RRR, no mgr GI: BS+, soft, nontender MSK: normal bulk and tone Neuro: drowsy but wakes easily, follows commands, answers questions  CBC    Component Value Date/Time   WBC 8.0 11/23/2019 0344   RBC 4.50 11/23/2019 0344   HGB 12.4 11/23/2019 0344   HGB 12.9 11/14/2019 1022   HGB 12.0 08/25/2017 1010   HCT 42.9 11/23/2019 0344   HCT 38.7 08/25/2017 1010   PLT 201 11/23/2019 0344   PLT 218 11/14/2019 1022   PLT 251 08/25/2017 1010   MCV 95.3 11/23/2019 0344   MCV 91 08/25/2017 1010   MCH 27.6 11/23/2019 0344   MCHC 28.9 (L) 11/23/2019 0344   RDW 15.6 (H) 11/23/2019 0344   RDW 14.2 08/25/2017 1010   LYMPHSABS 1.0 11/22/2019 1122   MONOABS 1.1 (H) 11/22/2019 1122   EOSABS 0.0 11/22/2019 1122   BASOSABS 0.0 11/22/2019 1122   BMET    Component Value Date/Time   NA 139 11/23/2019 0344   NA 142 08/25/2017 1010   K 4.8 11/23/2019 0344   CL 103 11/23/2019 0344   CO2 28 11/23/2019 0344   GLUCOSE 96 11/23/2019 0344   BUN 22 (H) 11/23/2019 0344   BUN 8 08/25/2017 1010   CREATININE 1.62 (H) 11/23/2019 0344   CREATININE 0.81 11/14/2019 1022   CREATININE 0.96 09/09/2016 0911   CALCIUM 8.5 (L) 11/23/2019 0344   GFRNONAA 36 (L) 11/23/2019  0344   GFRNONAA >60 11/14/2019 1022   GFRNONAA 69 09/09/2016 0911   GFRAA 41 (L) 11/23/2019 0344   GFRAA >60 11/14/2019 1022   GFRAA 79 09/09/2016 0911    CXR reviewed: left greater than right airspace disease, cardiomegaly  EKG: S1, Q3, T3 noted  LE doppler: positive for DVT  Impression/plan: AKI : U/A consistent with ATN, appears volume overloaded, give lasix, monitor carefully Acute hypercarbic encephalopathy: continue BIPAP, hold sedation Acute respiratory failure with hypercarbia and hypoxemia : complex picture, likely obesity hypoventilation syndrome and aspiration pneumonia AND acute pulmonary embolism.  I suspect that she has had a PE, we know she has DVT. Continue heparin, given volume overload add lasix today, take breaks from BIPAP for meals today Aspiration pneumonia: continue unasyn  Monitor carefully in ICU setting  My cc time 35 minutes  Roselie Awkward, MD [facility] Pager: [phone] Cell: [phone] If no response, call [phone]

## 2019-11-23 NOTE — Progress Notes (Signed)
11-21-19 R wrist ligament reconstruction as outpatient with regional block  Patient resting comfortably, family member by side  Right hand resting on pillow, fingers a [place] puffy, but not tensely swollen.  Forearm compartments soft, splint dressing intact.  He would seem there are no present surgical complications from ongoing anticoagulation. Will follow progress as inpt until d/c, with plan for keeping outpatient f/u 10-15 days postop.  Micheline Rough, MD [facility] [phone]

## 2019-11-23 NOTE — Progress Notes (Signed)
CRITICAL VALUE ALERT  Critical Value:  PTT >200  Date & Time Notied:  11/23/19 0112  Provider Notified: Warren Lacy   Orders Received/Actions taken: Spoke with pharmacy- Per Pharmacist will continue to run heparin. Using Heparin levels to dose instead of PTT.   Milford Cage, RN

## 2019-11-23 NOTE — Progress Notes (Signed)
  Echocardiogram 2D Echocardiogram has been performed.  Angel Brewer 11/23/2019, 9:52 AM

## 2019-11-23 NOTE — Progress Notes (Signed)
Bilateral lower extremity venous duplex completed. Refer to "CV Proc" under chart review to view preliminary results. Preliminary results discussed with Thurmond Butts, RN.  11/23/2019 9:39 AM Kelby Aline., MHA, RVT, RDCS, RDMS

## 2019-11-24 LAB — BLOOD GAS, ARTERIAL
Acid-Base Excess: 9.8 mmol/L — ABNORMAL HIGH (ref 0.0–2.0)
Bicarbonate: 36.6 mmol/L — ABNORMAL HIGH (ref 20.0–28.0)
FIO2: 36
O2 Saturation: 96.1 %
Patient temperature: 36.6
pCO2 arterial: 78.3 mmHg (ref 32.0–48.0)
pH, Arterial: 7.288 — ABNORMAL LOW (ref 7.350–7.450)
pO2, Arterial: 83.3 mmHg (ref 83.0–108.0)

## 2019-11-24 LAB — CBC WITH DIFFERENTIAL/PLATELET
Abs Immature Granulocytes: 0.04 10*3/uL (ref 0.00–0.07)
Basophils Absolute: 0 10*3/uL (ref 0.0–0.1)
Basophils Relative: 0 %
Eosinophils Absolute: 0 10*3/uL (ref 0.0–0.5)
Eosinophils Relative: 0 %
HCT: 39.1 % (ref 36.0–46.0)
Hemoglobin: 11.3 g/dL — ABNORMAL LOW (ref 12.0–15.0)
Immature Granulocytes: 1 %
Lymphocytes Relative: 14 %
Lymphs Abs: 0.8 10*3/uL (ref 0.7–4.0)
MCH: 27.7 pg (ref 26.0–34.0)
MCHC: 28.9 g/dL — ABNORMAL LOW (ref 30.0–36.0)
MCV: 95.8 fL (ref 80.0–100.0)
Monocytes Absolute: 0.8 10*3/uL (ref 0.1–1.0)
Monocytes Relative: 14 %
Neutro Abs: 4.1 10*3/uL (ref 1.7–7.7)
Neutrophils Relative %: 71 %
Platelets: 171 10*3/uL (ref 150–400)
RBC: 4.08 MIL/uL (ref 3.87–5.11)
RDW: 15.3 % (ref 11.5–15.5)
WBC: 5.7 10*3/uL (ref 4.0–10.5)
nRBC: 0.5 % — ABNORMAL HIGH (ref 0.0–0.2)

## 2019-11-24 LAB — COMPREHENSIVE METABOLIC PANEL
ALT: 56 U/L — ABNORMAL HIGH (ref 0–44)
AST: 66 U/L — ABNORMAL HIGH (ref 15–41)
Albumin: 3.2 g/dL — ABNORMAL LOW (ref 3.5–5.0)
Alkaline Phosphatase: 36 U/L — ABNORMAL LOW (ref 38–126)
Anion gap: 8 (ref 5–15)
BUN: 20 mg/dL (ref 6–20)
CO2: 32 mmol/L (ref 22–32)
Calcium: 8.4 mg/dL — ABNORMAL LOW (ref 8.9–10.3)
Chloride: 99 mmol/L (ref 98–111)
Creatinine, Ser: 1.3 mg/dL — ABNORMAL HIGH (ref 0.44–1.00)
GFR calc Af Amer: 54 mL/min — ABNORMAL LOW (ref >60)
GFR calc non Af Amer: 46 mL/min — ABNORMAL LOW (ref >60)
Glucose, Bld: 102 mg/dL — ABNORMAL HIGH (ref 70–99)
Potassium: 4.3 mmol/L (ref 3.5–5.1)
Sodium: 139 mmol/L (ref 135–145)
Total Bilirubin: 0.8 mg/dL (ref 0.3–1.2)
Total Protein: 6.4 g/dL — ABNORMAL LOW (ref 6.5–8.1)

## 2019-11-24 LAB — HEPARIN LEVEL (UNFRACTIONATED)
Heparin Unfractionated: 0.94 IU/mL — ABNORMAL HIGH (ref 0.30–0.70)
Heparin Unfractionated: 0.49 IU/mL (ref 0.30–0.70)

## 2019-11-24 LAB — POCT I-STAT 7, (LYTES, BLD GAS, ICA,H+H)
Acid-Base Excess: 11 mmol/L — ABNORMAL HIGH (ref 0.0–2.0)
Bicarbonate: 41.5 mmol/L — ABNORMAL HIGH (ref 20.0–28.0)
Calcium, Ion: 1.27 mmol/L (ref 1.15–1.40)
HCT: 40 % (ref 36.0–46.0)
Hemoglobin: 13.6 g/dL (ref 12.0–15.0)
O2 Saturation: 100 %
Potassium: 4.4 mmol/L (ref 3.5–5.1)
Sodium: 142 mmol/L (ref 135–145)
TCO2: 44 mmol/L — ABNORMAL HIGH (ref 22–32)
pCO2 arterial: 84.1 mmHg (ref 32.0–48.0)
pH, Arterial: 7.301 — ABNORMAL LOW (ref 7.350–7.450)
pO2, Arterial: 199 mmHg — ABNORMAL HIGH (ref 83.0–108.0)

## 2019-11-24 LAB — GLUCOSE, CAPILLARY
Glucose-Capillary: 103 mg/dL — ABNORMAL HIGH (ref 70–99)
Glucose-Capillary: 74 mg/dL (ref 70–99)
Glucose-Capillary: 81 mg/dL (ref 70–99)
Glucose-Capillary: 85 mg/dL (ref 70–99)
Glucose-Capillary: 95 mg/dL (ref 70–99)
Glucose-Capillary: 98 mg/dL (ref 70–99)

## 2019-11-24 LAB — APTT: aPTT: 110 seconds — ABNORMAL HIGH (ref 24–36)

## 2019-11-24 MED ORDER — FUROSEMIDE 10 MG/ML IJ SOLN
40.0000 mg | Freq: Four times a day (QID) | INTRAMUSCULAR | Status: AC
  Administered 2019-11-24 (×2): 40 mg via INTRAVENOUS
  Filled 2019-11-24 (×2): qty 4

## 2019-11-24 MED ORDER — INSULIN ASPART 100 UNIT/ML ~~LOC~~ SOLN: 3.0000 [IU] | SUBCUTANEOUS | Status: DC

## 2019-11-24 NOTE — Progress Notes (Signed)
Attending:    Subjective: Hypoglycemic overnight CVP 33 last night Net negative  Objective: Vitals:   11/24/19 0732 11/24/19 0800 11/24/19 0830 11/24/19 0900  BP:  (!) 146/63 123/74 (!) 141/68  Pulse:  (!) 54 60 60  Resp:  _0 Temp: 98.3 F (36.8 C)     TempSrc: Axillary     SpO2:  100% 92% 95%  Weight:      Height:       Vent Mode: PCV;BIPAP FiO2 (%):  [40 %] 40 % Set Rate:  [28 bmp] 28 bmp PEEP:  [10 cmH20] 10 cmH20  Intake/Output Summary (Last 24 hours) at 11/24/2019 1007 Last data filed at 11/24/2019 0900 Gross per 24 hour  Intake 1431.53 ml  Output 3050 ml  Net -1618.47 ml    General:  Morbidly obese, resting comfortably in bed HENT: NCAT OP clear PULM: CTA B, normal effort CV: RRR, no mgr GI: BS+, soft, nontender MSK: normal bulk and tone Neuro: drowsy, can tell me she is in the hospital, no distress, MAEW    CBC    Component Value Date/Time   WBC 5.7 11/24/2019 0344   RBC 4.08 11/24/2019 0344   HGB 11.3 (L) 11/24/2019 0344   HGB 12.9 11/14/2019 1022   HGB 12.0 08/25/2017 1010   HCT 39.1 11/24/2019 0344   HCT 38.7 08/25/2017 1010   PLT 171 11/24/2019 0344   PLT 218 11/14/2019 1022   PLT 251 08/25/2017 1010   MCV 95.8 11/24/2019 0344   MCV 91 08/25/2017 1010   MCH 27.7 11/24/2019 0344   MCHC 28.9 (L) 11/24/2019 0344   RDW 15.3 11/24/2019 0344   RDW 14.2 08/25/2017 1010   LYMPHSABS 0.8 11/24/2019 0344   MONOABS 0.8 11/24/2019 0344   EOSABS 0.0 11/24/2019 0344   BASOSABS 0.0 11/24/2019 0344    BMET    Component Value Date/Time   NA 139 11/24/2019 0344   NA 142 08/25/2017 1010   K 4.3 11/24/2019 0344   CL 99 11/24/2019 0344   CO2 32 11/24/2019 0344   GLUCOSE 102 (H) 11/24/2019 0344   BUN 20 11/24/2019 0344   BUN 8 08/25/2017 1010   CREATININE 1.30 (H) 11/24/2019 0344   CREATININE 0.81 11/14/2019 1022   CREATININE 0.96 09/09/2016 0911   CALCIUM 8.4 (L) 11/24/2019 0344   GFRNONAA 46 (L) 11/24/2019 0344   GFRNONAA >60 11/14/2019  1022   GFRNONAA 69 09/09/2016 0911   GFRAA 54 (L) 11/24/2019 0344   GFRAA >60 11/14/2019 1022   GFRAA 79 09/09/2016 0911   TTE 5/12 LVEF 55-60%, RV normal, RVSP 55 mmHg  Impression/Plan: AKI: improving: still volume overloaded, lasix again today Acute on chronic hypercarbic respiratory failure: BIPAP qHS here and with naps, resume CPAP at home, consider resting daytime ABG in hospital prior to discharge to see if she needs NIMV/BIPAP.  Shock liver: improving, monitor PRN Acute metabolic (hypercarbic) encephalopathy: check ABG, restart BIPAP, diurese DVT/Likely PE> heparin Aspiration pneumonia: set stop date 5 days  Remain in ICU  My cc time 35 minutes  Roselie Awkward, MD [place] [facility] Pager: [phone] Cell: [phone] After 3pm or if no response, call [phone]

## 2019-11-24 NOTE — Progress Notes (Signed)
ANTICOAGULATION CONSULT NOTE Pharmacy Consult for Heparin Indication: r/o acute PE, hx DVT/PE  No Known Allergies  Patient Measurements: Height: _0  (172.7 cm) Weight: (!) 186.9 kg (412 lb) IBW/kg (Calculated) : 63.9 Heparin Dosing Weight: 114kg  Vital Signs: Temp: 98 F (36.7 C) (05/13 1123) Temp Source: Axillary (05/13 1123) BP: 134/80 (05/13 1400) Pulse Rate: 52 (05/13 1400)  Labs: Recent Labs    11/22/19 1122 11/22/19 1122 11/22/19 1151 11/22/19 1151 11/22/19 1642 11/22/19 1659 11/22/19 2150 11/22/19 2356 11/23/19 0344 11/23/19 1534 11/24/19 0344 11/24/19 1401  HGB 13.6   < > 15.3*   < >  --   --   --   --  12.4  --  11.3*  --   HCT 48.3*   < > 45.0  --   --   --   --   --  42.9  --  39.1  --   PLT 252  --   --   --   --   --   --   --  201  --  171  --   APTT  --   --   --   --   --   --   --  >200*  --   --   --   --   HEPARINUNFRC  --   --   --   --   --   --   --  0.60  --   --  0.94* 0.49  CREATININE 2.25*  --   --    < >  --   --   --   --  1.62* 1.31* 1.30*  --   CKTOTAL 271*  --   --   --   --   --   --   --   --   --   --   --   TROPONINIHS 457*   < >  --   --  1,072* 1,115* 910*  --   --   --   --   --    < > = values in this interval not displayed.    Estimated Creatinine Clearance: 88.3 mL/min (A) (by C-G formula based on SCr of 1.3 mg/dL (H)).   Medical History: Past Medical History:  Diagnosis Date  . DVT (deep vein thrombosis) in pregnancy    RLE DVT 02/2016  . Dyspnea   . Morbid obesity ([place])   . PE (pulmonary embolism) 02/29/2016  . Peripheral vascular disease ([place])   . Sleep apnea    uses a cpap    Medications:  Infusions:  . sodium chloride 10 mL/hr at 11/24/19 1300  . ampicillin-sulbactam (UNASYN) IV Stopped (11/24/19 1226)  . dextrose 30 mL/hr at 11/24/19 1300  . heparin 1,700 Units/hr (11/24/19 1300)    Assessment: 47 yof presented to the ED with SOB and AMS. S/p wrist surgery 5/10. She is on chronic xarelto for history  of VTE but she has been on this since 5/6 for her surgery. Baseline CBC is WNL. Troponin elevated. To transition to IV heparin.   Heparin level 0.49 -therapeutic at 1700 units/hr.  CBC wnl. No bleeding reported.   Goal of Therapy:  Heparin level 0.3-0.7 units/ml Monitor platelets by anticoagulation protocol: Yes   Plan:  Continue heparin at 1700 units/hr Daily heparin level and CBC  Sloan Leiter, PharmD, BCPS, BCCCP Clinical Pharmacist Please refer to [facility] for [place] numbers 11/24/2019, 3:23 PM

## 2019-11-24 NOTE — Progress Notes (Signed)
Patient taken off BiPAP and placed on 4L nasal cannula per MD. Will get ABG at 11 am.

## 2019-11-24 NOTE — Progress Notes (Signed)
ANTICOAGULATION CONSULT NOTE - Follow Up Consult  Pharmacy Consult for heparin Indication: DVT and presumed PE  Labs: Recent Labs    11/22/19 1122 11/22/19 1122 11/22/19 1151 11/22/19 1151 11/22/19 1642 11/22/19 1659 11/22/19 2150 11/22/19 2356 11/23/19 0344 11/23/19 1534 11/24/19 0344  HGB 13.6   < > 15.3*   < >  --   --   --   --  12.4  --  11.3*  HCT 48.3*   < > 45.0  --   --   --   --   --  42.9  --  39.1  PLT 252  --   --   --   --   --   --   --  201  --  171  APTT  --   --   --   --   --   --   --  >200*  --   --   --   HEPARINUNFRC  --   --   --   --   --   --   --  0.60  --   --  0.94*  CREATININE 2.25*  --   --   --   --   --   --   --  1.62* 1.31*  --   CKTOTAL 271*  --   --   --   --   --   --   --   --   --   --   TROPONINIHS 457*   < >  --   --  1,072* 1,115* 910*  --   --   --   --    < > = values in this interval not displayed.    Assessment: 54yo female supratherapeutic on heparin after one level at goal; no gtt issues or signs of bleeding per RN.  Goal of Therapy:  Heparin level 0.3-0.7 units/ml   Plan:  Will decrease heparin gtt by 2-3 units/kgABW/hr to 1700 units/hr and check level in 6 hours.    Wynona Neat, PharmD, BCPS  11/24/2019,4:24 AM

## 2019-11-24 NOTE — Progress Notes (Signed)
PT Cancellation Note  Patient Details Name: Angel Brewer MRN: [id number] DOB: 10-04-1965   Cancelled Treatment:    Reason Eval/Treat Not Completed: Patient not medically ready.  Per RN, pt unable to participate, not able to arouse. 11/24/2019  Ginger Carne., PT Acute Rehabilitation Services [phone]  (pager) [phone]  (office)   [name] 11/24/2019, 3:01 PM

## 2019-11-24 NOTE — Progress Notes (Signed)
~   1215 MD notified critical lab:  PCO2 78.3, PO2 83.3, 7.288; RT went up on IPAP   ABG in afternoon - PCO2 increased; RT raised resp rate.  Pt still obtunded; awakens briefly to state name.  Niece at bedside states less interactive than day before.

## 2019-11-24 NOTE — Progress Notes (Signed)
PULMONARY / CRITICAL CARE MEDICINE   NAME:  Angel Brewer, MRN:  [id number], DOB:  Mar 21, 1966, LOS: 2 ADMISSION DATE:  11/22/2019, CONSULTATION DATE:  11/22/19 REFERRING MD:  Dr. Ronnald Nian, CHIEF COMPLAINT:  Acute hypercarbic respiratory failure  BRIEF HISTORY:    54 yo female with PMH of DVT and PE on Xarelto (2017) and OSA on home CPAP presented with SOB and AMS. She had right wrist surgery on 5/10 and had stopped her Xarelto on 5/6 in preparation for surgery. She was advised to remain off Xarelto for 10 days post-op. She had been experiencing SOB and chest pain the days leading up to her surgery. She was found at home confused and on the floor by her niece.  SIGNIFICANT PAST MEDICAL HISTORY     DVT (deep vein thrombosis) in pregnancy    RLE DVT 02/2016  . Dyspnea   . Morbid obesity ([place])   . PE (pulmonary embolism) 02/29/2016  . Peripheral vascular disease ([place])   . Sleep apnea    uses a cpap     SIGNIFICANT EVENTS:  5/11: CVC placed  STUDIES:   EKG 5/11: S1Q3T3  CXR 12-22-22: Stable cardiomegaly and pulmonary vascular congestion  LE Doppler 12-22-2022: LEFT: Findings consistent with acute deep vein thrombosis involving the left posterior tibial veins, and left peroneal veins. RIGHT: No evidence of DVT  Echo 2022-12-22: 1. Left ventricular ejection fraction, by estimation, is 55 to 60%. The  left ventricle has normal function. The left ventricle has no regional  Olivine Hiers motion abnormalities. Left ventricular diastolic parameters are  consistent with Grade I diastolic  dysfunction (impaired relaxation).  2. Right ventricular systolic function is moderately reduced. The right  ventricular size is moderately enlarged. There is moderately elevated  pulmonary artery systolic pressure. The estimated right ventricular  systolic pressure is 37.3 mmHg.  3. Left atrial size was mildly dilated.  4. Right atrial size was mildly dilated.  5. The mitral valve is normal in structure. Mild mitral  valve  regurgitation. No evidence of mitral stenosis.  6. The aortic valve is normal in structure. Aortic valve regurgitation is  not visualized. No aortic stenosis is present.  7. The inferior vena cava is dilated in size with <50% respiratory  variability, suggesting right atrial pressure of 15 mmHg.    CULTURES:  Blood culture: NGTD  ANTIBIOTICS:  Unasyn 5/11 -> present  LINES/TUBES:  CVC  CONSULTANTS:    SUBJECTIVE:  Pt is alert and oriented. Pt has hypoglycemic event last night with blood glucose of 61. She was started on 6m/hr D10W. Heparin level supratherapeutic, thus rate decreased to 1700 U/hr.  CONSTITUTIONAL: BP 134/81   Pulse (!) 58   Temp 99 F (37.2 C) (Axillary)   Resp (!) 22   Ht _0  (1.727 m)   Wt (!) 186.9 kg   LMP 11/29/2015   SpO2 97%   BMI 62.64 kg/m   I/O last 3 completed shifts: In: 1833.4 [P.O.:50; I.V.:1283.3; IV Piggyback:500.1] Out: 2950 [Urine:2950]  CVP:  [27 mmHg-33 mmHg] 31 mmHg  Vent Mode: PCV;BIPAP FiO2 (%):  [40 %] 40 % Set Rate:  [28 bmp] 28 bmp PEEP:  [10 cmH20] 10 cmH20  PHYSICAL EXAM: General: morbidly obese, resting comfortably in bed on BiPAP Neuro: drowsy but awakens easily, able to follow commands and answer questions HEENT:  NCAT, OP clear, NIV in place Cardiovascular:  RRR, no m/g/r, +edema BLE Lungs: CTAB Abdomen: soft, nontender, nonndistended, BS+ Musculoskeletal:  Normal bulk and tone Skin: no rash  RESOLVED PROBLEM LIST  None  ASSESSMENT AND PLAN   Pt is a 54yo female with h/o DVT with PE (2017) on Xarelto at home and OSA on home CPAP who presented with acute hypercarbic respiratory failure. Xarelto has been held since 5/6 for surgery. She was found on the floor at home with confusion and episodes of emesis. Concern for aspiration event vs PE vs acute decompensated HF. Of note, she did not wear CPAP that night but has been taking pain medication for recent R wrist surgery. However, CXR not currently  concerning revealing consolidation and vitals/labs not indicative of infection. EKG revealed S1Q3T3 pattern, which in the setting of discontinuing Xarelto and LE doppler revealing DVT is highly suggestive of PE. She also had elevated troponin, BNP, CK, AST/ALT and creatinine on admission.  Acute hypercarbic respiratory failure Acute hypercarbic encephalopathy - PE vs. Aspiration event vs. Decompensated HF - LE Doppler revealed DVT and EKG revealed S1Q3T3 pattern. Pt started on heparin for suspected PE. CT angio not performed d/t elevated creatinine, but no longer needed as this would not change management. - Echo revealed LV EF 55-60% with normal systolic function. Grade I diastolic dysfunction. RV systolic function mildly reduced with moderate enlargement of RV. Moderately increased pulmonary arterial systolic pressure. RV systolic pressure 56.3. LA and RA mildly dilated. - On Unasyn for possible aspiration PNA. Will continue for total of 5 days. Trend CBC/temp. - Continue BIPAP, hold sedation. Take breaks from BIPAP for meals. -Heparin level supratherapeutic, thus rate decreased to 1700 U/hr. Will repeat heparin level, aPTT. -Consider V/Q scan for evaluation of suspected PE in setting of elevated creatinine -Repeat ABG given pt still drowsy and disoriented on exam  AKI - UA consistent with ATN, neg for infection - Pt appears volume overloaded. Continue 69m Lasix and monitor carefully with BMP - FeNA 0.4% indicating pre-renal etiology. Pt was hypotensive on admission, thus most likely had decreased effective renal perfusion resulting in ischemia and acute tubular necrosis.  SUMMARY OF TODAY'S PLAN:  Continue Lasix. Will place on BiPAP again today. Will recheck aPTT, heparin levels. BMP and CBC in am.  Best Practice / Goals of Care / Disposition.   DVT PROPHYLAXIS: Heparin NUTRITION: NPO MOBILITY: Bedrest Glucose control: monitor CODE STATUS: full FAMILY DISCUSSIONS: will update  familly DISPOSITION: ICU  LABS  Glucose Recent Labs  Lab 11/22/19 2032 11/23/19 1929 11/23/19 2034 11/24/19 0016 11/24/19 0343  GLUCAP 88 61* 79 74 85    BMET Recent Labs  Lab 11/23/19 0344 11/23/19 1534 11/24/19 0344  NA 139 141 139  K 4.8 4.9 4.3  CL 103 100 99  CO2 28 30 32  BUN 22* 23* 20  CREATININE 1.62* 1.31* 1.30*  GLUCOSE 96 95 102*    Liver Enzymes Recent Labs  Lab 11/22/19 1122 11/24/19 0344  AST 115* 66*  ALT 54* 56*  ALKPHOS 53 36*  BILITOT 1.1 0.8  ALBUMIN 3.7 3.2*    Electrolytes Recent Labs  Lab 11/23/19 0344 11/23/19 1534 11/24/19 0344  CALCIUM 8.5* 8.6* 8.4*  MG 2.2  --   --   PHOS 4.5  --   --     CBC Recent Labs  Lab 11/22/19 1122 11/22/19 1122 11/22/19 1151 11/23/19 0344 11/24/19 0344  WBC 9.2  --   --  8.0 5.7  HGB 13.6   < > 15.3* 12.4 11.3*  HCT 48.3*   < > 45.0 42.9 39.1  PLT 252  --   --  201 171   < > =  values in this interval not displayed.    ABG Recent Labs  Lab 11/22/19 1038 11/22/19 1151  PHART 7.120* 7.171*  PCO2ART 95.0* 87.5*  PO2ART 106 83    Coag's Recent Labs  Lab 11/22/19 2356  APTT >200*    Sepsis Markers Recent Labs  Lab 11/22/19 1122 11/22/19 1642 11/22/19 1659  LATICACIDVEN 3.1* 1.2  --   PROCALCITON  --   --  0.11    Cardiac Enzymes No results for input(s): TROPONINI, PROBNP in the last 168 hours.  PAST MEDICAL HISTORY :   She  has a past medical history of DVT (deep vein thrombosis) in pregnancy, Dyspnea, Morbid obesity ([place]), PE (pulmonary embolism) (02/29/2016), Peripheral vascular disease ([place]), and Sleep apnea.  PAST SURGICAL HISTORY:  She  has a past surgical history that includes Cesarean section; Open reduction internal fixation (orif) distal radial fracture (Right, 06/20/2019); and Synovectomy (Right, 11/21/2019).  No Known Allergies  No current facility-administered medications on file prior to encounter.   Current Outpatient Medications on File Prior to  Encounter  Medication Sig  . oxyCODONE (ROXICODONE) 5 MG immediate release tablet Take 1 tablet (5 mg total) by mouth every 6 (six) hours as needed for severe pain.  Marland Kitchen PRESCRIPTION MEDICATION CPAP- At bedtime  . acetaminophen (TYLENOL) 325 MG tablet Take 2 tablets (650 mg total) by mouth every 6 (six) hours. (Patient not taking: Reported on 11/22/2019)  . diclofenac Sodium (VOLTAREN) 1 % GEL Apply 2-4 g topically 4 (four) times daily as needed (knee pain).   . rivaroxaban (XARELTO) 20 MG TABS tablet Take 1 tablet (20 mg total) by mouth daily with supper.    FAMILY HISTORY:   Her family history is not on file.  SOCIAL HISTORY:  She  reports that she has quit smoking. She has never used smokeless tobacco. She reports that she does not drink alcohol or use drugs.

## 2019-11-25 LAB — POCT I-STAT 7, (LYTES, BLD GAS, ICA,H+H)
Acid-Base Excess: 14 mmol/L — ABNORMAL HIGH (ref 0.0–2.0)
Acid-Base Excess: 16 mmol/L — ABNORMAL HIGH (ref 0.0–2.0)
Bicarbonate: 43.7 mmol/L — ABNORMAL HIGH (ref 20.0–28.0)
Bicarbonate: 43.9 mmol/L — ABNORMAL HIGH (ref 20.0–28.0)
Calcium, Ion: 1.3 mmol/L (ref 1.15–1.40)
Calcium, Ion: 1.28 mmol/L (ref 1.15–1.40)
HCT: 39 % (ref 36.0–46.0)
HCT: 38 % (ref 36.0–46.0)
Hemoglobin: 13.3 g/dL (ref 12.0–15.0)
Hemoglobin: 12.9 g/dL (ref 12.0–15.0)
O2 Saturation: 99 %
O2 Saturation: 96 %
Patient temperature: 98.8
Potassium: 4.1 mmol/L (ref 3.5–5.1)
Potassium: 4 mmol/L (ref 3.5–5.1)
Sodium: 143 mmol/L (ref 135–145)
Sodium: 141 mmol/L (ref 135–145)
TCO2: 46 mmol/L — ABNORMAL HIGH (ref 22–32)
TCO2: 46 mmol/L — ABNORMAL HIGH (ref 22–32)
pCO2 arterial: 83.3 mmHg (ref 32.0–48.0)
pCO2 arterial: 70.2 mmHg (ref 32.0–48.0)
pH, Arterial: 7.327 — ABNORMAL LOW (ref 7.350–7.450)
pH, Arterial: 7.404 (ref 7.350–7.450)
pO2, Arterial: 140 mmHg — ABNORMAL HIGH (ref 83.0–108.0)
pO2, Arterial: 84 mmHg (ref 83.0–108.0)

## 2019-11-25 LAB — BASIC METABOLIC PANEL
Anion gap: 7 (ref 5–15)
BUN: 18 mg/dL (ref 6–20)
CO2: 39 mmol/L — ABNORMAL HIGH (ref 22–32)
Calcium: 9.2 mg/dL (ref 8.9–10.3)
Chloride: 97 mmol/L — ABNORMAL LOW (ref 98–111)
Creatinine, Ser: 1.12 mg/dL — ABNORMAL HIGH (ref 0.44–1.00)
GFR calc Af Amer: >60 mL/min (ref >60)
GFR calc non Af Amer: 56 mL/min — ABNORMAL LOW (ref >60)
Glucose, Bld: 109 mg/dL — ABNORMAL HIGH (ref 70–99)
Potassium: 4.3 mmol/L (ref 3.5–5.1)
Sodium: 143 mmol/L (ref 135–145)

## 2019-11-25 LAB — CBC
HCT: 40.7 % (ref 36.0–46.0)
Hemoglobin: 11.8 g/dL — ABNORMAL LOW (ref 12.0–15.0)
MCH: 27.9 pg (ref 26.0–34.0)
MCHC: 29 g/dL — ABNORMAL LOW (ref 30.0–36.0)
MCV: 96.2 fL (ref 80.0–100.0)
Platelets: 175 10*3/uL (ref 150–400)
RBC: 4.23 MIL/uL (ref 3.87–5.11)
RDW: 15.1 % (ref 11.5–15.5)
WBC: 4.8 10*3/uL (ref 4.0–10.5)
nRBC: 0 % (ref 0.0–0.2)

## 2019-11-25 LAB — APTT: aPTT: 104 seconds — ABNORMAL HIGH (ref 24–36)

## 2019-11-25 LAB — GLUCOSE, CAPILLARY
Glucose-Capillary: 84 mg/dL (ref 70–99)
Glucose-Capillary: 86 mg/dL (ref 70–99)
Glucose-Capillary: 93 mg/dL (ref 70–99)
Glucose-Capillary: 95 mg/dL (ref 70–99)
Glucose-Capillary: 98 mg/dL (ref 70–99)
Glucose-Capillary: 99 mg/dL (ref 70–99)

## 2019-11-25 LAB — HEPARIN LEVEL (UNFRACTIONATED)
Heparin Unfractionated: 0.5 IU/mL (ref 0.30–0.70)
Heparin Unfractionated: 0.45 IU/mL (ref 0.30–0.70)

## 2019-11-25 MED ORDER — ETOMIDATE 2 MG/ML IV SOLN: 20.0000 mg | Freq: Once | INTRAVENOUS | Status: DC

## 2019-11-25 MED ORDER — FENTANYL CITRATE (PF) 100 MCG/2ML IJ SOLN: 100.0000 ug | Freq: Once | INTRAMUSCULAR | Status: DC

## 2019-11-25 MED ORDER — FUROSEMIDE 10 MG/ML IJ SOLN
40.0000 mg | Freq: Two times a day (BID) | INTRAMUSCULAR | Status: DC
  Administered 2019-11-25 – 2019-11-26 (×2): 40 mg via INTRAVENOUS
  Filled 2019-11-25 (×2): qty 4

## 2019-11-25 MED ORDER — SUCCINYLCHOLINE CHLORIDE 20 MG/ML IJ SOLN
100.0000 mg | Freq: Once | INTRAMUSCULAR | Status: DC
  Filled 2019-11-25: qty 5

## 2019-11-25 MED ORDER — FENTANYL CITRATE (PF) 100 MCG/2ML IJ SOLN
INTRAMUSCULAR | Status: AC
  Filled 2019-11-25: qty 2

## 2019-11-25 MED ORDER — DEXMEDETOMIDINE HCL IN NACL 400 MCG/100ML IV SOLN
0.4000 ug/kg/h | INTRAVENOUS | Status: DC
  Filled 2019-11-25: qty 100

## 2019-11-25 MED ORDER — MIDAZOLAM HCL 2 MG/2ML IJ SOLN
INTRAMUSCULAR | Status: AC
  Filled 2019-11-25: qty 2

## 2019-11-25 MED ORDER — ATROPINE SULFATE 1 MG/10ML IJ SOSY
PREFILLED_SYRINGE | INTRAMUSCULAR | Status: AC
  Filled 2019-11-25: qty 10

## 2019-11-25 MED ORDER — HYDRALAZINE HCL 20 MG/ML IJ SOLN
10.0000 mg | INTRAMUSCULAR | Status: DC | PRN
  Administered 2019-11-25: 10 mg via INTRAVENOUS
  Filled 2019-11-25: qty 1

## 2019-11-25 MED ORDER — MIDAZOLAM HCL 2 MG/2ML IJ SOLN: 2.0000 mg | Freq: Once | INTRAMUSCULAR | Status: DC

## 2019-11-25 NOTE — Progress Notes (Signed)
PT Cancellation Note  Patient Details Name: Angel Brewer MRN: [id number] DOB: 1966-07-02   Cancelled Treatment:    Reason Eval/Treat Not Completed: Medical issues which prohibited therapy.  Hold again per RN.  Deciding whether they are going to intubate. 11/25/2019  Ginger Carne., PT Acute Rehabilitation Services [phone]  (pager) [phone]  (office)   Tessie Fass Alexias Margerum 11/25/2019, 4:09 PM

## 2019-11-25 NOTE — Progress Notes (Signed)
eLink Physician-Brief Progress Note Patient Name: Angel Brewer DOB: 06-07-1966 MRN: [id number]   Date of Service  11/25/2019  HPI/Events of Note  Hypertension - BP = 208/97 with MAP = 127. Can't give ordered B-Blocker d/t bradycardia (HR + 49).  eICU Interventions  Will order: 1. Hydralazine 10 mg IV Q 4 hours PRN SBP > 170 or DBP > 100.      Intervention Category Major Interventions: Hypertension - evaluation and management  [name] 11/25/2019, 10:33 PM

## 2019-11-25 NOTE — Progress Notes (Addendum)
ANTICOAGULATION CONSULT NOTE Pharmacy Consult for Heparin Indication: acute DVT, r/o PE, hx DVT/PE  No Known Allergies  Patient Measurements: Height: _0  (172.7 cm) Weight: (!) 181.8 kg (400 lb 12.7 oz) IBW/kg (Calculated) : 63.9 Heparin Dosing Weight: 114kg  Vital Signs: Temp: 98.5 F (36.9 C) (05/14 0700) Temp Source: Axillary (05/14 0700) BP: 147/79 (05/14 1000) Pulse Rate: 52 (05/14 1000)  Labs: Recent Labs    11/22/19 1122 11/22/19 1122 11/22/19 1151 11/22/19 1642 11/22/19 1659 11/22/19 2150 11/22/19 2356 11/22/19 2356 11/23/19 0344 11/23/19 0344 11/23/19 1534 11/24/19 0344 11/24/19 0344 11/24/19 1401 11/24/19 1608 11/25/19 0449  HGB 13.6   < >   < >  --   --   --   --    < > 12.4  --   --  11.3*   < >  --  13.6 11.8*  HCT 48.3*   < >   < >  --   --   --   --    < > 42.9  --   --  39.1  --   --  40.0 40.7  PLT 252   < >  --   --   --   --   --   --  201  --   --  171  --   --   --  175  APTT  --   --   --   --   --   --  >200*  --   --   --   --   --   --  110*  --   --   HEPARINUNFRC  --   --   --   --   --   --  0.60   < >  --   --   --  0.94*  --  0.49  --  0.50  CREATININE 2.25*   < >  --   --   --   --   --   --  1.62*   < > 1.31* 1.30*  --   --   --  1.12*  CKTOTAL 271*  --   --   --   --   --   --   --   --   --   --   --   --   --   --   --   TROPONINIHS 457*  --    < > 1,072* 1,115* 910*  --   --   --   --   --   --   --   --   --   --    < > = values in this interval not displayed.    Estimated Creatinine Clearance: 100.7 mL/min (A) (by C-G formula based on SCr of 1.12 mg/dL (H)).   Medical History: Past Medical History:  Diagnosis Date  . DVT (deep vein thrombosis) in pregnancy    RLE DVT 02/2016  . Dyspnea   . Morbid obesity ([place])   . PE (pulmonary embolism) 02/29/2016  . Peripheral vascular disease ([place])   . Sleep apnea    uses a cpap    Medications:  Infusions:  . sodium chloride 10 mL/hr at 11/25/19 0600  . ampicillin-sulbactam  (UNASYN) IV 3 g (11/25/19 0645)  . dextrose 30 mL/hr at 11/25/19 0646  . heparin 1,700 Units/hr (11/25/19 0600)    Assessment: 79 yof presented to the ED with SOB and AMS, found to  have acute LLE DVT. S/p wrist surgery 5/10. She is on chronic Xarelto for history of VTE but she has been on this since 5/6 for her surgery. Baseline CBC is WNL. Troponin elevated. To transition to IV heparin.   Heparin level remains therapeutic at 0.5. Hg low but stable at 11.8, plt wnl. No bleeding issues reported.   Goal of Therapy:  Heparin level 0.3-0.7 units/ml Monitor platelets by anticoagulation protocol: Yes   Plan:  Continue heparin at 1700 units/hr Monitor daily heparin level and CBC, s/sx bleeding   Arturo Morton, PharmD, BCPS Please check AMION for all [facility] contact numbers Clinical Pharmacist 11/25/2019 11:01 AM

## 2019-11-25 NOTE — Progress Notes (Signed)
PULMONARY / CRITICAL CARE MEDICINE   NAME:  Angel Brewer, MRN:  [id number], DOB:  03-Mar-1966, LOS: 3 ADMISSION DATE:  11/22/2019, CONSULTATION DATE:  11/22/19 REFERRING MD:  Dr. Ronnald Nian, CHIEF COMPLAINT:  Acute hypercarbic respiratory failure  BRIEF HISTORY:    54 yo female with PMH of DVT and PE on Xarelto (2017) and OSA on home CPAP presented with SOB and AMS. She had right wrist surgery on 5/10 and had stopped her Xarelto on 5/6 in preparation for surgery. She was advised to remain off Xarelto for 10 days post-op. She had been experiencing SOB and chest pain the days leading up to her surgery. She was found at home confused and on the floor by her niece.  SIGNIFICANT PAST MEDICAL HISTORY     DVT (deep vein thrombosis) in pregnancy    RLE DVT 02/2016  . Dyspnea   . Morbid obesity ([place])   . PE (pulmonary embolism) 02/29/2016  . Peripheral vascular disease ([place])   . Sleep apnea    uses a cpap     SIGNIFICANT EVENTS:  5/11: CVC placed  STUDIES:   EKG 5/11: S1Q3T3  CXR 2022/12/21: Stable cardiomegaly and pulmonary vascular congestion  LE Doppler 21-Dec-2022: LEFT: Findings consistent with acute deep vein thrombosis involving the left posterior tibial veins, and left peroneal veins. RIGHT: No evidence of DVT  Echo Dec 21, 2022: 1. Left ventricular ejection fraction, by estimation, is 55 to 60%. The  left ventricle has normal function. The left ventricle has no regional  Tannar Broker motion abnormalities. Left ventricular diastolic parameters are  consistent with Grade I diastolic  dysfunction (impaired relaxation).  2. Right ventricular systolic function is moderately reduced. The right  ventricular size is moderately enlarged. There is moderately elevated  pulmonary artery systolic pressure. The estimated right ventricular  systolic pressure is 52.8 mmHg.  3. Left atrial size was mildly dilated.  4. Right atrial size was mildly dilated.  5. The mitral valve is normal in structure. Mild mitral  valve  regurgitation. No evidence of mitral stenosis.  6. The aortic valve is normal in structure. Aortic valve regurgitation is  not visualized. No aortic stenosis is present.  7. The inferior vena cava is dilated in size with <50% respiratory  variability, suggesting right atrial pressure of 15 mmHg.    CULTURES:  Blood culture: NGTD  ANTIBIOTICS:  Unasyn 5/11 -> present  LINES/TUBES:  CVC  CONSULTANTS:    SUBJECTIVE:  No acute events overnight. Pt continues to be drowsy on exam. Able to tell me she is at the hospital, but unable to answer otherwise. Was able to wiggle her toes on command. Diuresed well yesterday with Lasix (-2.5L).  CONSTITUTIONAL: BP 137/68   Pulse (!) 51   Temp 98.7 F (37.1 C) (Axillary)   Resp 20   Ht _0  (1.727 m)   Wt (!) 181.8 kg   LMP 11/29/2015   SpO2 99%   BMI 60.94 kg/m   I/O last 3 completed shifts: In: 2401.5 [I.V.:1901.5; IV Piggyback:500] Out: 5100 [Urine:5100]     Vent Mode: BIPAP FiO2 (%):  [40 %-60 %] 40 % Set Rate:  [8 bmp-20 bmp] 20 bmp PEEP:  [5 cmH20-8 cmH20] 8 cmH20  PHYSICAL EXAM: General: morbidly obese, resting comfortably in bed on BiPAP Neuro: drowsy but awakens easily, able to follow commands and answer some questions HEENT:  NCAT, OP clear, NIV in place Cardiovascular:  RRR, no m/g/r, +edema BLE Lungs: CTAB Abdomen: soft, nontender, nonndistended, BS+ Musculoskeletal:  Normal bulk  and tone Skin: no rash  RESOLVED PROBLEM LIST  None  ASSESSMENT AND PLAN   Pt is a 54yo female with h/o DVT with PE (2017) on Xarelto at home and OSA on home CPAP who presented with acute hypercarbic respiratory failure. Xarelto has been held since 5/6 for surgery. She was found on the floor at home with confusion and episodes of emesis. Concern for aspiration event vs PE vs acute decompensated HF. Of note, she did not wear CPAP that night but has been taking pain medication for recent R wrist surgery. However, CXR not  currently concerning revealing consolidation and vitals/labs not indicative of infection. EKG revealed S1Q3T3 pattern, which in the setting of discontinuing Xarelto and LE doppler revealing DVT is highly suggestive of PE. She also had elevated troponin, BNP, CK, AST/ALT and creatinine on admission. Echo revealed moderately enlarged RV with mildly decreased function and elevated pressure.  Acute hypercarbic respiratory failure Acute hypercarbic encephalopathy - PE vs. Aspiration event vs. Decompensated HF - LE Doppler revealed DVT and EKG revealed S1Q3T3 pattern. Pt started on heparin for suspected PE. CT angio not performed d/t elevated creatinine, but no longer needed as this would not change management. - Echo revealed LV EF 55-60% with normal systolic function. Grade I diastolic dysfunction. RV systolic function mildly reduced with moderate enlargement of RV. Moderately increased pulmonary arterial systolic pressure. RV systolic pressure 02.5. LA and RA mildly dilated. - On Unasyn for possible aspiration PNA. Will continue for total of 5 days. Trend CBC/temp. - Continue BIPAP, hold sedation. Take breaks from BIPAP for meals. -Cont heparin @ 1700U/hr. Will repeat heparin level and aPTT. -Consider CT angio to confirm PE as her creatinine is improving  -Repeat ABG given pt still drowsy and disoriented on exam; most recent ABG 7.3/84.1/199/41.3 -If pt does not become more alert and/or repeat ABG does not reveal improving pCO2, will most likely have to intubate today  AKI - UA consistent with ATN, neg for infection - Pt appears volume overloaded. Continue 53m Lasix BID and monitor carefully with BMP - FeNA 0.4% indicating pre-renal etiology. Pt was hypotensive on admission, thus most likely had decreased effective renal perfusion resulting in ischemia and acute tubular necrosis.  SUMMARY OF TODAY'S PLAN:  Continue Lasix. Will continue with BiPAP today. Repeat ABG today. If pCO2 not decreasing,  will have to intubate. Will recheck aPTT, heparin levels. BMP and CBC in am.  Best Practice / Goals of Care / Disposition.   DVT PROPHYLAXIS: Heparin NUTRITION: NPO MOBILITY: Bedrest Glucose control: monitor CODE STATUS: full FAMILY DISCUSSIONS: will update familly DISPOSITION: ICU  LABS  Glucose Recent Labs  Lab 11/24/19 1532 11/24/19 1933 11/24/19 2352 11/25/19 0328 11/25/19 0736 11/25/19 1135  GLUCAP 95 81 98 98 99 93    BMET Recent Labs  Lab 11/23/19 1534 11/23/19 1534 11/24/19 0344 11/24/19 1608 11/25/19 0449  NA 141   < > 139 142 143  K 4.9   < > 4.3 4.4 4.3  CL 100  --  99  --  97*  CO2 30  --  32  --  39*  BUN 23*  --  20  --  18  CREATININE 1.31*  --  1.30*  --  1.12*  GLUCOSE 95  --  102*  --  109*   < > = values in this interval not displayed.    Liver Enzymes Recent Labs  Lab 11/22/19 1122 11/24/19 0344  AST 115* 66*  ALT 54* 56*  ALKPHOS  53 36*  BILITOT 1.1 0.8  ALBUMIN 3.7 3.2*    Electrolytes Recent Labs  Lab 11/23/19 0344 11/23/19 0344 11/23/19 1534 11/24/19 0344 11/25/19 0449  CALCIUM 8.5*   < > 8.6* 8.4* 9.2  MG 2.2  --   --   --   --   PHOS 4.5  --   --   --   --    < > = values in this interval not displayed.    CBC Recent Labs  Lab 11/23/19 0344 11/23/19 0344 11/24/19 0344 11/24/19 1608 11/25/19 0449  WBC 8.0  --  5.7  --  4.8  HGB 12.4   < > 11.3* 13.6 11.8*  HCT 42.9   < > 39.1 40.0 40.7  PLT 201  --  171  --  175   < > = values in this interval not displayed.    ABG Recent Labs  Lab 11/22/19 1151 11/24/19 1153 11/24/19 1608  PHART 7.171* 7.288* 7.301*  PCO2ART 87.5* 78.3* 84.1*  PO2ART 83 83.3 199*    Coag's Recent Labs  Lab 11/22/19 2356 11/24/19 1401  APTT >200* 110*    Sepsis Markers Recent Labs  Lab 11/22/19 1122 11/22/19 1642 11/22/19 1659  LATICACIDVEN 3.1* 1.2  --   PROCALCITON  --   --  0.11    Cardiac Enzymes No results for input(s): TROPONINI, PROBNP in the last 168  hours.  PAST MEDICAL HISTORY :   She  has a past medical history of DVT (deep vein thrombosis) in pregnancy, Dyspnea, Morbid obesity ([place]), PE (pulmonary embolism) (02/29/2016), Peripheral vascular disease ([place]), and Sleep apnea.  PAST SURGICAL HISTORY:  She  has a past surgical history that includes Cesarean section; Open reduction internal fixation (orif) distal radial fracture (Right, 06/20/2019); and Synovectomy (Right, 11/21/2019).  No Known Allergies  No current facility-administered medications on file prior to encounter.   Current Outpatient Medications on File Prior to Encounter  Medication Sig  . oxyCODONE (ROXICODONE) 5 MG immediate release tablet Take 1 tablet (5 mg total) by mouth every 6 (six) hours as needed for severe pain.  Marland Kitchen PRESCRIPTION MEDICATION CPAP- At bedtime  . acetaminophen (TYLENOL) 325 MG tablet Take 2 tablets (650 mg total) by mouth every 6 (six) hours. (Patient not taking: Reported on 11/22/2019)  . diclofenac Sodium (VOLTAREN) 1 % GEL Apply 2-4 g topically 4 (four) times daily as needed (knee pain).   . rivaroxaban (XARELTO) 20 MG TABS tablet Take 1 tablet (20 mg total) by mouth daily with supper.    FAMILY HISTORY:   Her family history is not on file.  SOCIAL HISTORY:  She  reports that she has quit smoking. She has never used smokeless tobacco. She reports that she does not drink alcohol or use drugs.

## 2019-11-25 NOTE — Care Management (Signed)
CM contacted by Case Manager [place] Anthony with [facility].  CM informed that this pt's hospitalization will be covered under Workman's Comp.  CM informed CM that consent to release information is required in order for clinical information to be sent/discussed with WC CM.  WC CM to send consent via email.    [place] Anthony [phone]

## 2019-11-26 LAB — GLUCOSE, CAPILLARY
Glucose-Capillary: 72 mg/dL (ref 70–99)
Glucose-Capillary: 85 mg/dL (ref 70–99)
Glucose-Capillary: 80 mg/dL (ref 70–99)
Glucose-Capillary: 98 mg/dL (ref 70–99)
Glucose-Capillary: 95 mg/dL (ref 70–99)

## 2019-11-26 LAB — CBC WITH DIFFERENTIAL/PLATELET
Abs Immature Granulocytes: 0.03 K/uL (ref 0.00–0.07)
Basophils Absolute: 0.1 K/uL (ref 0.0–0.1)
Basophils Relative: 1 %
Eosinophils Absolute: 0 K/uL (ref 0.0–0.5)
Eosinophils Relative: 1 %
HCT: 40.6 % (ref 36.0–46.0)
Hemoglobin: 12 g/dL (ref 12.0–15.0)
Immature Granulocytes: 1 %
Lymphocytes Relative: 21 %
Lymphs Abs: 1 K/uL (ref 0.7–4.0)
MCH: 27.5 pg (ref 26.0–34.0)
MCHC: 29.6 g/dL — ABNORMAL LOW (ref 30.0–36.0)
MCV: 92.9 fL (ref 80.0–100.0)
Monocytes Absolute: 0.7 K/uL (ref 0.1–1.0)
Monocytes Relative: 15 %
Neutro Abs: 2.8 K/uL (ref 1.7–7.7)
Neutrophils Relative %: 61 %
Platelets: 181 K/uL (ref 150–400)
RBC: 4.37 MIL/uL (ref 3.87–5.11)
RDW: 14.6 % (ref 11.5–15.5)
WBC: 4.6 K/uL (ref 4.0–10.5)
nRBC: 0 % (ref 0.0–0.2)

## 2019-11-26 LAB — BASIC METABOLIC PANEL
Anion gap: 10 (ref 5–15)
BUN: 15 mg/dL (ref 6–20)
CO2: 40 mmol/L — ABNORMAL HIGH (ref 22–32)
Calcium: 9.5 mg/dL (ref 8.9–10.3)
Chloride: 93 mmol/L — ABNORMAL LOW (ref 98–111)
Creatinine, Ser: 0.98 mg/dL (ref 0.44–1.00)
GFR calc Af Amer: >60 mL/min (ref >60)
GFR calc non Af Amer: >60 mL/min (ref >60)
Glucose, Bld: 92 mg/dL (ref 70–99)
Potassium: 4 mmol/L (ref 3.5–5.1)
Sodium: 143 mmol/L (ref 135–145)

## 2019-11-26 LAB — HEPARIN LEVEL (UNFRACTIONATED): Heparin Unfractionated: 0.31 IU/mL (ref 0.30–0.70)

## 2019-11-26 MED ORDER — RIVAROXABAN 20 MG PO TABS: 20.0000 mg | ORAL_TABLET | Freq: Every day | ORAL | Status: DC

## 2019-11-26 MED ORDER — RIVAROXABAN 15 MG PO TABS
15.0000 mg | ORAL_TABLET | Freq: Two times a day (BID) | ORAL | Status: DC
  Administered 2019-11-26 – 2019-12-02 (×12): 15 mg via ORAL
  Filled 2019-11-26 (×13): qty 1

## 2019-11-26 NOTE — Progress Notes (Signed)
PULMONARY / CRITICAL CARE MEDICINE   NAME:  Angel Brewer, MRN:  [id number], DOB:  16-Feb-1966, LOS: 4 ADMISSION DATE:  11/22/2019, CONSULTATION DATE:  11/22/19 REFERRING MD:  Dr. Ronnald Nian, CHIEF COMPLAINT:  Acute hypercarbic respiratory failure  BRIEF HISTORY:    54 yo female with PMH of DVT and PE on Xarelto (2017) and OSA on home CPAP presented with SOB and AMS. She had right wrist surgery on 5/10 and had stopped her Xarelto on 5/6 in preparation for surgery. She was advised to remain off Xarelto for 10 days post-op. She had been experiencing SOB and chest pain the days leading up to her surgery. She was found at home confused and on the floor by her niece.  SIGNIFICANT PAST MEDICAL HISTORY     DVT (deep vein thrombosis) in pregnancy    RLE DVT 02/2016  . Dyspnea   . Morbid obesity ([place])   . PE (pulmonary embolism) 02/29/2016  . Peripheral vascular disease ([place])   . Sleep apnea    uses a cpap     SIGNIFICANT EVENTS:  5/11: CVC placed  STUDIES:   EKG 5/11: S1Q3T3  CXR 08-Dec-2022: Stable cardiomegaly and pulmonary vascular congestion  LE Doppler Dec 08, 2022: LEFT: Findings consistent with acute deep vein thrombosis involving the left posterior tibial veins, and left peroneal veins. RIGHT: No evidence of DVT  Echo 2022/12/08: 1. Left ventricular ejection fraction, by estimation, is 55 to 60%. The  left ventricle has normal function. The left ventricle has no regional  wall motion abnormalities. Left ventricular diastolic parameters are  consistent with Grade I diastolic  dysfunction (impaired relaxation).  2. Right ventricular systolic function is moderately reduced. The right  ventricular size is moderately enlarged. There is moderately elevated  pulmonary artery systolic pressure. The estimated right ventricular  systolic pressure is 94.7 mmHg.  3. Left atrial size was mildly dilated.  4. Right atrial size was mildly dilated.  5. The mitral valve is normal in structure. Mild mitral  valve  regurgitation. No evidence of mitral stenosis.  6. The aortic valve is normal in structure. Aortic valve regurgitation is  not visualized. No aortic stenosis is present.  7. The inferior vena cava is dilated in size with <50% respiratory  variability, suggesting right atrial pressure of 15 mmHg.    CULTURES:  Blood culture: NGTD  ANTIBIOTICS:  Unasyn 5/11 -> present  LINES/TUBES:  CVC  CONSULTANTS:    SUBJECTIVE:  Significantly more awake today.  No respiratory distress on nasal cannula.  Denies pain.  Appetite is good.  Eager to get out of bed.  CONSTITUTIONAL: BP (!) 112/43   Pulse (!) 55   Temp (!) 97.5 F (36.4 C) (Axillary)   Resp 15   Ht _0  (1.727 m)   Wt (!) 185.5 kg   LMP 11/29/2015   SpO2 92%   BMI 62.19 kg/m   I/O last 3 completed shifts: In: 2254.2 [I.V.:1854.2; IV Piggyback:400] Out: 5050 [Urine:5050]     Vent Mode: BIPAP FiO2 (%):  [30 %-40 %] 30 % Set Rate:  [20 bmp] 20 bmp PEEP:  [8 cmH20] 8 cmH20  PHYSICAL EXAM: General: morbidly obese, resting comfortably in bed on nasal cannula. Neuro: Fully conversant.  Moves all limbs with reasonable strength. HEENT:  NCAT, OP clear, NIV in place Cardiovascular:  RRR, no m/g/r, +edema BLE Lungs: CTAB Abdomen: soft, nontender, nonndistended, BS+ Musculoskeletal:  Normal bulk and tone Skin: no rash  RESOLVED PROBLEM LIST  None  ASSESSMENT AND PLAN  Acute on chronic hypercarbic hypoxic respiratory failure with encephalopathy requiring noninvasive ventilation.  Secondary to obstructive sleep apnea/obesity hypoventilation and likely pulmonary venous thromboembolic disease. Now significantly improved.  Likely due to clot resolution. -Resume home CPAP -Progressive ambulation -Resume oral anticoagulation. -Stop antibiotics as low suspicion of pneumonia -Stop further diuresis as minimal edema.   Status post reconstruction scapholunate ligament. -Management as per orthopedics   Best  Practice / Goals of Care / Disposition.   DVT PROPHYLAXIS: Restart Xarelto NUTRITION: Full diet MOBILITY: Progressive ambulation Glucose control: monitor CODE STATUS: full FAMILY DISCUSSIONS: Patient and family updated at bedside DISPOSITION: Transfer of mental status remains stable overnight.  May be ready for discharge soon.  LABS  Glucose Recent Labs  Lab 11/25/19 1558 11/25/19 1926 11/25/19 2337 11/26/19 0400 11/26/19 0801 11/26/19 1151  GLUCAP 86 95 84 72 85 80    BMET Recent Labs  Lab 11/24/19 0344 11/24/19 1608 11/25/19 0449 11/25/19 0449 11/25/19 1408 11/25/19 1736 11/26/19 1017  NA 139   < > 143   < > 143 141 143  K 4.3   < > 4.3   < > 4.1 4.0 4.0  CL 99  --  97*  --   --   --  93*  CO2 32  --  39*  --   --   --  40*  BUN 20  --  18  --   --   --  15  CREATININE 1.30*  --  1.12*  --   --   --  0.98  GLUCOSE 102*  --  109*  --   --   --  92   < > = values in this interval not displayed.    Liver Enzymes Recent Labs  Lab 11/22/19 1122 11/24/19 0344  AST 115* 66*  ALT 54* 56*  ALKPHOS 53 36*  BILITOT 1.1 0.8  ALBUMIN 3.7 3.2*    Electrolytes Recent Labs  Lab 11/23/19 0344 11/23/19 1534 11/24/19 0344 11/25/19 0449 11/26/19 1017  CALCIUM 8.5*   < > 8.4* 9.2 9.5  MG 2.2  --   --   --   --   PHOS 4.5  --   --   --   --    < > = values in this interval not displayed.    CBC Recent Labs  Lab 11/24/19 0344 11/24/19 1608 11/25/19 0449 11/25/19 0449 11/25/19 1408 11/25/19 1736 11/26/19 1017  WBC 5.7  --  4.8  --   --   --  4.6  HGB 11.3*   < > 11.8*   < > 13.3 12.9 12.0  HCT 39.1   < > 40.7   < > 39.0 38.0 40.6  PLT 171  --  175  --   --   --  181   < > = values in this interval not displayed.    ABG Recent Labs  Lab 11/24/19 1608 11/25/19 1408 11/25/19 1736  PHART 7.301* 7.327* 7.404  PCO2ART 84.1* 83.3* 70.2*  PO2ART 199* 140* 84    Coag's Recent Labs  Lab 11/22/19 2356 11/24/19 1401 11/25/19 1229  APTT >200* 110*  104*    Sepsis Markers Recent Labs  Lab 11/22/19 1122 11/22/19 1642 11/22/19 1659  LATICACIDVEN 3.1* 1.2  --   PROCALCITON  --   --  0.11    Cardiac Enzymes No results for input(s): TROPONINI, PROBNP in the last 168 hours.  Kipp Brood, MD [facility] ICU Physician [place]  Pager: [phone] Mobile: [phone] After hours: [phone].  11/26/2019, 3:53 PM

## 2019-11-26 NOTE — Evaluation (Signed)
Physical Therapy Evaluation Patient Details Name: Angel Brewer MRN: [id number] DOB: 09/10/1965 Today's Date: 11/26/2019   History of Present Illness  54 yo female with PMH of DVT and PE on Xarelto (2017) and OSA on home CPAP presented with SOB and AMS. She had right wrist surgery on 5/10 for reconstruction scapholunate ligament and had stopped her Xarelto on 5/6 in preparation for surgery. She was advised to remain off Xarelto for 10 days post-op. She had been experiencing SOB and chest pain the days leading up to her surgery. She was found at home confused and on the floor by her niece.  Clinical Impression  Patient presents with decreased mobility due to general weakness and prolonged bedrest.  Currently mod A and at times +2 for safety due to size and decreased R UE use.  Patient independent prior also had returned to work as Scientist, water quality at [employer] prior to learning she needed another surgery for her wrist.  She should progress and be able to go home with family support.  Pt to follow acutely.     Follow Up Recommendations Home health PT;Supervision/Assistance - 24 hour    Equipment Recommendations  None recommended by PT    Recommendations for Other Services       Precautions / Restrictions Precautions Precautions: Fall Restrictions Weight Bearing Restrictions: No Other Position/Activity Restrictions: watch R wrist, no specific weight bearing orders, but protected nonetheless      Mobility  Bed Mobility Overal bed mobility: Needs Assistance Bed Mobility: Supine to Sit     Supine to sit: Mod assist;HOB elevated;+2 for safety/equipment     General bed mobility comments: assist to lift trunk and scoot hips (daughter in the room and assisting)  Transfers Overall transfer level: Needs assistance Equipment used: 2 person hand held assist Transfers: Sit to/from [facility] Sit to Stand: Min assist;+2 safety/equipment;Mod assist Stand pivot transfers: Mod  assist       General transfer comment: up to [facility] assist for L hand hold and daughter assisting under R arm/elbow, then up for hygiene and stand step to chair holding bed rail on L UE  Ambulation/Gait             General Gait Details: deferred  Stairs            Wheelchair Mobility    Modified Rankin (Stroke Patients Only)       Balance Overall balance assessment: Needs assistance   Sitting balance-[name]: Fair     Standing balance support: Single extremity supported;Bilateral upper extremity supported Standing balance-[name]: Poor Standing balance comment: UE support needed in standing today                             Pertinent Vitals/Pain Pain Assessment: No/denies pain    Home Living Family/patient expects to be discharged to:: Private residence Living Arrangements: Alone Available Help at Discharge: Family;Available 24 hours/day Type of Home: House Home Access: Level entry     Home Layout: Able to live on main level with bedroom/bathroom Home Equipment: None      Prior Function Level of Independence: Independent               Hand Dominance   Dominant Hand: Right    Extremity/Trunk Assessment   Upper Extremity Assessment Upper Extremity Assessment: RUE deficits/detail RUE Deficits / Details: wrist in splint    Lower Extremity Assessment Lower Extremity Assessment: Generalized weakness  Communication   Communication: No difficulties  Cognition Arousal/Alertness: Awake/alert Behavior During Therapy: WFL for tasks assessed/performed Overall Cognitive Status: Within Functional Limits for tasks assessed                                        General Comments General comments (skin integrity, edema, etc.): assisted for hygiene as soiled with urine in bed due to leaking pure wick and on IV lasix; assisted to place new gown and new purewick and up to recliner    Exercises      Assessment/Plan    PT Assessment Patient needs continued PT services  PT Problem List Decreased activity tolerance;Decreased strength;Cardiopulmonary status limiting activity;Decreased balance;Decreased knowledge of use of DME;Decreased knowledge of precautions;Decreased mobility       PT Treatment Interventions Therapeutic activities;Gait training;Functional mobility training;Therapeutic exercise;Patient/family education    PT Goals (Current goals can be found in the Care Plan section)  Acute Rehab PT Goals Patient Stated Goal: to go home PT Goal Formulation: With patient/family Time For Goal Achievement: 12/10/19 Potential to Achieve Goals: Good    Frequency Min 3X/week   Barriers to discharge        Co-evaluation               AM-PAC PT "6 Clicks" Mobility  Outcome Measure Help needed turning from your back to your side while in a flat bed without using bedrails?: A [place] Help needed moving from lying on your back to sitting on the side of a flat bed without using bedrails?: A Lot Help needed moving to and from a bed to a chair (including a wheelchair)?: A Lot Help needed standing up from a chair using your arms (e.g., wheelchair or bedside chair)?: A Lot Help needed to walk in hospital room?: A Lot Help needed climbing 3-5 steps with a railing? : Total 6 Click Score: 12    End of Session   Activity Tolerance: Patient tolerated treatment well Patient left: in chair;with call bell/phone within reach;with family/visitor present Nurse Communication: Mobility status PT Visit Diagnosis: Other abnormalities of gait and mobility (R26.89);Muscle weakness (generalized) (M62.81)    Time: 1430-1501 PT Time Calculation (min) (ACUTE ONLY): 31 min   Charges:   PT Evaluation $PT Eval Moderate Complexity: 1 Mod PT Treatments $Therapeutic Activity: 8-22 mins        Magda Kiel, [place] Acute Rehabilitation Services [phone] 11/26/2019   Reginia Naas 11/26/2019,  5:53 PM

## 2019-11-26 NOTE — Progress Notes (Signed)
Pt placed on Bipap 14/5 per Home CPAP order however once alseep backup ventilation initiated due to no Pt effort. Settings changed to BIPAP/PC 14/5 BUR 10 30% FiO2. Pt tolerating well at this time. RT will continue to monitor

## 2019-11-26 NOTE — Progress Notes (Addendum)
ANTICOAGULATION CONSULT NOTE Pharmacy Consult for Heparin Indication: acute DVT, r/o PE, hx DVT/PE  No Known Allergies  Patient Measurements: Height: _0  (172.7 cm) Weight: (!) 185.5 kg (409 lb) IBW/kg (Calculated) : 63.9 Heparin Dosing Weight: 114kg  Vital Signs: Temp: 97.5 F (36.4 C) (05/15 0700) Temp Source: Axillary (05/15 0700) BP: 126/72 (05/15 0800) Pulse Rate: 54 (05/15 0800)  Labs: Recent Labs    11/23/19 1534 11/24/19 0344 11/24/19 0344 11/24/19 1401 11/24/19 1608 11/25/19 0449 11/25/19 0449 11/25/19 1229 11/25/19 1408 11/25/19 1736  HGB  --  11.3*  --   --    < > 11.8*   < >  --  13.3 12.9  HCT  --  39.1  --   --    < > 40.7  --   --  39.0 38.0  PLT  --  171  --   --   --  175  --   --   --   --   APTT  --   --   --  110*  --   --   --  104*  --   --   HEPARINUNFRC  --  0.94*   < > 0.49  --  0.50  --  0.45  --   --   CREATININE 1.31* 1.30*  --   --   --  1.12*  --   --   --   --    < > = values in this interval not displayed.    Estimated Creatinine Clearance: 102 mL/min (A) (by C-G formula based on SCr of 1.12 mg/dL (H)).   Medical History: Past Medical History:  Diagnosis Date  . DVT (deep vein thrombosis) in pregnancy    RLE DVT 02/2016  . Dyspnea   . Morbid obesity ([place])   . PE (pulmonary embolism) 02/29/2016  . Peripheral vascular disease ([place])   . Sleep apnea    uses a cpap    Medications:  Infusions:  . sodium chloride 10 mL/hr at 11/26/19 0800  . ampicillin-sulbactam (UNASYN) IV Stopped (11/26/19 6754)  . dexmedetomidine (PRECEDEX) IV infusion    . dextrose 30 mL/hr at 11/26/19 0800  . heparin 1,700 Units/hr (11/26/19 0800)    Assessment: 12 yof presented to the ED with SOB and AMS, found to have acute LLE DVT. S/p wrist surgery 5/10. She is on chronic Xarelto for history of VTE but was off since 5/6 in anticipation of surgery. Baseline CBC is WNL. Troponin elevated. To transition to IV heparin on 5/11.   Pharmacy consulted to  transition to [place]. Per discussion with MD, patient ok to swallow and will have diet ordered. CBC wnl, SCr 0.98. No active bleed issues reported.  Goal of Therapy:  VTE treatment Monitor platelets by anticoagulation protocol: Yes   Plan:  D/c heparin infusion at time of 1st dose of Xarelto (will start at next mealtime 1700) - communicated plan with RN Xarelto 58m PO BID x 21 days; then 236mPO Qsupper Monitor CBC, s/sx bleeding   HaArturo MortonPharmD, BCPS Please check AMION for all [facility] numbers Clinical Pharmacist 11/26/2019 8:51 AM

## 2019-11-27 LAB — GLUCOSE, CAPILLARY
Glucose-Capillary: 113 mg/dL — ABNORMAL HIGH (ref 70–99)
Glucose-Capillary: 72 mg/dL (ref 70–99)
Glucose-Capillary: 81 mg/dL (ref 70–99)
Glucose-Capillary: 89 mg/dL (ref 70–99)
Glucose-Capillary: 89 mg/dL (ref 70–99)
Glucose-Capillary: 90 mg/dL (ref 70–99)
Glucose-Capillary: 98 mg/dL (ref 70–99)

## 2019-11-27 LAB — POCT I-STAT 7, (LYTES, BLD GAS, ICA,H+H)
Acid-Base Excess: 14 mmol/L — ABNORMAL HIGH (ref 0.0–2.0)
Bicarbonate: 43.4 mmol/L — ABNORMAL HIGH (ref 20.0–28.0)
Calcium, Ion: 1.28 mmol/L (ref 1.15–1.40)
HCT: 38 % (ref 36.0–46.0)
Hemoglobin: 12.9 g/dL (ref 12.0–15.0)
O2 Saturation: 97 %
Patient temperature: 98.7
Potassium: 4.2 mmol/L (ref 3.5–5.1)
Sodium: 142 mmol/L (ref 135–145)
TCO2: 46 mmol/L — ABNORMAL HIGH (ref 22–32)
pCO2 arterial: 84.6 mmHg (ref 32.0–48.0)
pH, Arterial: 7.319 — ABNORMAL LOW (ref 7.350–7.450)
pO2, Arterial: 110 mmHg — ABNORMAL HIGH (ref 83.0–108.0)

## 2019-11-27 LAB — CULTURE, BLOOD (ROUTINE X 2)
Culture: NO GROWTH
Culture: NO GROWTH
Special Requests: ADEQUATE

## 2019-11-27 LAB — CBC
HCT: 40.2 % (ref 36.0–46.0)
Hemoglobin: 11.8 g/dL — ABNORMAL LOW (ref 12.0–15.0)
MCH: 27.4 pg (ref 26.0–34.0)
MCHC: 29.4 g/dL — ABNORMAL LOW (ref 30.0–36.0)
MCV: 93.3 fL (ref 80.0–100.0)
Platelets: 162 10*3/uL (ref 150–400)
RBC: 4.31 MIL/uL (ref 3.87–5.11)
RDW: 14.6 % (ref 11.5–15.5)
WBC: 3.5 10*3/uL — ABNORMAL LOW (ref 4.0–10.5)
nRBC: 0 % (ref 0.0–0.2)

## 2019-11-27 NOTE — Progress Notes (Addendum)
Night shift RN expressed concerns re: pt w/ red urine ~0600.  Pt up to [facility] ~0915, 218m red urine.  Relayed to CCM MD who feels most likely superficial irritation d/t purwick.  Will continue to monitor. D/c's purwick.

## 2019-11-27 NOTE — Progress Notes (Signed)
PULMONARY / CRITICAL CARE MEDICINE   NAME:  Angel Brewer, MRN:  [id number], DOB:  01-17-1966, LOS: 5 ADMISSION DATE:  11/22/2019, CONSULTATION DATE:  11/22/19 REFERRING MD:  Dr. Ronnald Nian, CHIEF COMPLAINT:  Acute hypercarbic respiratory failure  BRIEF HISTORY:    54 yo female with PMH of DVT and PE on Xarelto (2017) and OSA on home CPAP presented with SOB and AMS. She had right wrist surgery on 5/10 and had stopped her Xarelto on 5/6 in preparation for surgery. She was advised to remain off Xarelto for 10 days post-op. She had been experiencing SOB and chest pain the days leading up to her surgery. She was found at home confused and on the floor by her niece.  SIGNIFICANT PAST MEDICAL HISTORY     DVT (deep vein thrombosis) in pregnancy    RLE DVT 02/2016  . Dyspnea   . Morbid obesity ([place])   . PE (pulmonary embolism) 02/29/2016  . Peripheral vascular disease ([place])   . Sleep apnea    uses a cpap     SIGNIFICANT EVENTS:  5/11: CVC placed  STUDIES:   EKG 5/11: S1Q3T3  CXR 14-Dec-2022: Stable cardiomegaly and pulmonary vascular congestion  LE Doppler 2022-12-14: LEFT: Findings consistent with acute deep vein thrombosis involving the left posterior tibial veins, and left peroneal veins. RIGHT: No evidence of DVT  Echo December 14, 2022: 1. Left ventricular ejection fraction, by estimation, is 55 to 60%. The  left ventricle has normal function. The left ventricle has no regional  wall motion abnormalities. Left ventricular diastolic parameters are  consistent with Grade I diastolic  dysfunction (impaired relaxation).  2. Right ventricular systolic function is moderately reduced. The right  ventricular size is moderately enlarged. There is moderately elevated  pulmonary artery systolic pressure. The estimated right ventricular  systolic pressure is 14.4 mmHg.  3. Left atrial size was mildly dilated.  4. Right atrial size was mildly dilated.  5. The mitral valve is normal in structure. Mild mitral  valve  regurgitation. No evidence of mitral stenosis.  6. The aortic valve is normal in structure. Aortic valve regurgitation is  not visualized. No aortic stenosis is present.  7. The inferior vena cava is dilated in size with <50% respiratory  variability, suggesting right atrial pressure of 15 mmHg.    CULTURES:  Blood culture: NGTD  ANTIBIOTICS:  Unasyn 5/11 -> present  LINES/TUBES:  CVC  CONSULTANTS:    SUBJECTIVE:  Significantly more awake today.  No respiratory distress on nasal cannula.  Denies pain.  Appetite is good.  Eager to get out of bed.  CONSTITUTIONAL: BP (!) 157/81   Pulse (!) 49   Temp 97.7 F (36.5 C) (Oral)   Resp (!) 21   Ht _0  (1.727 m)   Wt (!) 178.7 kg   LMP 11/29/2015   SpO2 97%   BMI 59.91 kg/m   I/O last 3 completed shifts: In: 1972 [I.V.:1772; IV Piggyback:200] Out: 3154 [Urine:3430]     Vent Mode: BIPAP;PCV FiO2 (%):  [30 %] 30 % Set Rate:  [10 bmp] 10 bmp PEEP:  [5 cmH20] 5 cmH20  PHYSICAL EXAM: General: morbidly obese, resting comfortably in bed on nasal cannula. Neuro: Fully conversant.  Moves all limbs with reasonable strength. HEENT:  NCAT, OP clear, NIV in place Cardiovascular:  RRR, no m/g/r, +edema BLE Lungs: CTAB Abdomen: soft, nontender, nonndistended, BS+ Musculoskeletal:  Normal bulk and tone Skin: no rash  RESOLVED PROBLEM LIST  None  ASSESSMENT AND PLAN  Acute on chronic hypercarbic hypoxic respiratory failure with encephalopathy requiring noninvasive ventilation.  Secondary to obstructive sleep apnea/obesity hypoventilation and likely pulmonary venous thromboembolic disease. Now significantly improved.  Likely due to clot resolution. -Resumed home CPAP -Progressive ambulation -Resumed oral anticoagulation. -Stopped antibiotics as low suspicion of pneumonia -Stopped further diuresis as minimal edema.  - Progressive ambulation, discharge planning   Status post reconstruction scapholunate  ligament. -Management as per orthopedics   Best Practice / Goals of Care / Disposition.   DVT PROPHYLAXIS: Restart Xarelto NUTRITION: Full diet MOBILITY: Progressive ambulation Glucose control: monitor CODE STATUS: full FAMILY DISCUSSIONS: Patient and family updated at bedside DISPOSITION: Transfer to Med/Surg. Discharge once ambulating well.  LABS  Glucose Recent Labs  Lab 11/26/19 1151 11/26/19 1555 11/26/19 2016 11/27/19 0004 11/27/19 0410 11/27/19 0751  GLUCAP 80 98 95 113* 90 89    BMET Recent Labs  Lab 11/24/19 0344 11/24/19 1608 11/25/19 0449 11/25/19 0521 11/25/19 1408 11/25/19 1736 11/26/19 1017  NA 139   < > 143   < > 143 141 143  K 4.3   < > 4.3   < > 4.1 4.0 4.0  CL 99  --  97*  --   --   --  93*  CO2 32  --  39*  --   --   --  40*  BUN 20  --  18  --   --   --  15  CREATININE 1.30*  --  1.12*  --   --   --  0.98  GLUCOSE 102*  --  109*  --   --   --  92   < > = values in this interval not displayed.    Liver Enzymes Recent Labs  Lab 11/22/19 1122 11/24/19 0344  AST 115* 66*  ALT 54* 56*  ALKPHOS 53 36*  BILITOT 1.1 0.8  ALBUMIN 3.7 3.2*    Electrolytes Recent Labs  Lab 11/23/19 0344 11/23/19 1534 11/24/19 0344 11/25/19 0449 11/26/19 1017  CALCIUM 8.5*   < > 8.4* 9.2 9.5  MG 2.2  --   --   --   --   PHOS 4.5  --   --   --   --    < > = values in this interval not displayed.    CBC Recent Labs  Lab 11/25/19 0449 11/25/19 0521 11/25/19 1736 11/26/19 1017 11/27/19 0519  WBC 4.8  --   --  4.6 3.5*  HGB 11.8*   < > 12.9 12.0 11.8*  HCT 40.7   < > 38.0 40.6 40.2  PLT 175  --   --  181 162   < > = values in this interval not displayed.    ABG Recent Labs  Lab 11/25/19 0521 11/25/19 1408 11/25/19 1736  PHART 7.319* 7.327* 7.404  PCO2ART 84.6* 83.3* 70.2*  PO2ART 110* 140* 84    Coag's Recent Labs  Lab 11/22/19 2356 11/24/19 1401 11/25/19 1229  APTT >200* 110* 104*    Sepsis Markers Recent Labs  Lab  11/22/19 1122 11/22/19 1642 11/22/19 1659  LATICACIDVEN 3.1* 1.2  --   PROCALCITON  --   --  0.11    Cardiac Enzymes No results for input(s): TROPONINI, PROBNP in the last 168 hours.  Kipp Brood, MD [facility] ICU Physician [place]  Pager: [phone] Mobile: [phone] After hours: [phone].  11/27/2019, 9:06 AM

## 2019-11-27 NOTE — Progress Notes (Signed)
Attempted report to 4N. Nurse will call back for report.

## 2019-11-28 LAB — TROPONIN I (HIGH SENSITIVITY)
Troponin I (High Sensitivity): 44 ng/L — ABNORMAL HIGH (ref <18)
Troponin I (High Sensitivity): 44 ng/L — ABNORMAL HIGH (ref <18)

## 2019-11-28 LAB — GLUCOSE, CAPILLARY
Glucose-Capillary: 77 mg/dL (ref 70–99)
Glucose-Capillary: 70 mg/dL (ref 70–99)
Glucose-Capillary: 91 mg/dL (ref 70–99)
Glucose-Capillary: 100 mg/dL — ABNORMAL HIGH (ref 70–99)

## 2019-11-28 MED ORDER — CHLORHEXIDINE GLUCONATE CLOTH 2 % EX PADS
6.0000 | MEDICATED_PAD | Freq: Every day | CUTANEOUS | Status: DC
  Administered 2019-11-28 – 2019-12-01 (×4): 6 via TOPICAL

## 2019-11-28 MED ORDER — INSULIN ASPART 100 UNIT/ML ~~LOC~~ SOLN: 3.0000 [IU] | Freq: Three times a day (TID) | SUBCUTANEOUS | Status: DC

## 2019-11-28 NOTE — Progress Notes (Signed)
Patient received in bariatric bed.  Alert and oriented x4. Oxygen on at 3L via [place]. VSS. Denies any pain. Oriented to unit and procedures. No acute needs voiced at this time, call bell in reach.

## 2019-11-28 NOTE — Progress Notes (Signed)
Placed on BIPAP for HS  previous settings from ICU transfer. Pt. Tolerating well at this time.

## 2019-11-28 NOTE — Progress Notes (Signed)
Pt taken off Bipap at this time. Placed on 2L [place] tolerating well. No distress noted.

## 2019-11-28 NOTE — Progress Notes (Signed)
Physical Therapy Treatment Patient Details Name: Angel Brewer MRN: [id number] DOB: [birth year] Today's Date: 11/28/2019    History of Present Illness 54 yo female with PMH of DVT and PE on Xarelto (2017) and OSA on home CPAP presented with SOB and AMS. She had right wrist surgery on 5/10 for reconstruction scapholunate ligament and had stopped her Xarelto on 5/6 in preparation for surgery. She was advised to remain off Xarelto for 10 days post-op. She had been experiencing SOB and chest pain the days leading up to her surgery. She was found at home confused and on the floor by her niece.    PT Comments    Patient progressing this session to in room ambulation to bathroom and back.  Able to get up some easier from bed, but still safer for +2 A with telemetry lines and O2.  Feel she should progress with continued skilled PT prior to d/c and follow up HHPT.   Follow Up Recommendations  Home health PT;Supervision/Assistance - 24 hour     Equipment Recommendations  None recommended by PT    Recommendations for Other Services       Precautions / Restrictions Precautions Precautions: Fall Restrictions Other Position/Activity Restrictions: watch R wrist, no specific weight bearing orders, but protected nonetheless    Mobility  Bed Mobility Overal bed mobility: Needs Assistance Bed Mobility: Supine to Sit     Supine to sit: Min assist;+2 for physical assistance;Mod assist     General bed mobility comments: assist for lifting trunk and scooting  Transfers Overall transfer level: Needs assistance Equipment used: 2 person hand held assist   Sit to Stand: Min assist;+2 safety/equipment         General transfer comment: up from EOB with assist for safety bilat HHA (elbow and shoulder on R) and increased time for balance/bringing feet together  Ambulation/Gait Ambulation/Gait assistance: Min assist;+2 safety/equipment Gait Distance (Feet): 12 Feet(x 2) Assistive device: 2  person hand held assist Gait Pattern/deviations: Step-to pattern;Wide base of support;Decreased stride length;Shuffle     General Gait Details: increased lateral excursion to clear feet, assist for balance bilat UE and safety; to bathroom and back to recliner   Stairs             Wheelchair Mobility    Modified Rankin (Stroke Patients Only)       Balance Overall balance assessment: Needs assistance   Sitting balance-[name]: Good     Standing balance support: Single extremity supported Standing balance-[name]: Poor Standing balance comment: UE support needed in standing today                            Cognition Arousal/Alertness: Awake/alert Behavior During Therapy: WFL for tasks assessed/performed Overall Cognitive Status: Within Functional Limits for tasks assessed                                        Exercises Other Exercises Other Exercises: seated in chair automatically kicking her legs out and back    General Comments General comments (skin integrity, edema, etc.): toileted on commode in bathoom noted blood tinged urine; assisted for hygiene      Pertinent Vitals/Pain Pain Assessment: Faces Faces Pain Scale: Hurts a Wyre bit Pain Location: perineal discomfort Pain Descriptors / Indicators: Discomfort Pain Intervention(s): Monitored during session    Home Living  Prior Function            PT Goals (current goals can now be found in the care plan section) Progress towards PT goals: Progressing toward goals    Frequency    Min 3X/week      PT Plan Current plan remains appropriate    Co-evaluation              AM-PAC PT "6 Clicks" Mobility   Outcome Measure  Help needed turning from your back to your side while in a flat bed without using bedrails?: A [place]ger Help needed moving from lying on your back to sitting on the side of a flat bed without using bedrails?:  A Lehr Help needed moving to and from a bed to a chair (including a wheelchair)?: A Graves Help needed standing up from a chair using your arms (e.g., wheelchair or bedside chair)?: A Lot Help needed to walk in hospital room?: A Lot Help needed climbing 3-5 steps with a railing? : Total 6 Click Score: 14    End of Session Equipment Utilized During Treatment: Oxygen Activity Tolerance: Patient tolerated treatment well Patient left: in chair;with call bell/phone within reach Nurse Communication: Mobility status PT Visit Diagnosis: Other abnormalities of gait and mobility (R26.89);Muscle weakness (generalized) (M62.81)     Time: 5947-0761 PT Time Calculation (min) (ACUTE ONLY): 25 min  Charges:  $Gait Training: 8-22 mins $Therapeutic Activity: 8-22 mins                     Angel Brewer, PT Acute Rehabilitation Services [phone] 11/28/2019    Angel Brewer 11/28/2019, 3:28 PM

## 2019-11-28 NOTE — Progress Notes (Signed)
Report given to [facility]N staff. Patient aware of transfer.

## 2019-11-28 NOTE — Progress Notes (Signed)
PROGRESS NOTE  Angel Brewer  YQI:[id number] DOB: [birth year] DOA: 11/22/2019 PCP: [name], NP   Brief Narrative: Angel Brewer is a 54 y.o. female with a history of DVT and PE (2017), morbid obesity (BMI 59), OSA on CPAP who presented 5/11 with dyspnea and confusion the day after  repeat surgery for scapholunate ligament reconstruction. She was admitted to the ICU requiring noninvasive ventilatory support. Lower extremity dopplers revealed acute left leg DVT, and, in addition to OHS/OSA, working diagnosis for acute respiratory decompensation is pulmonary venous thromboembolic disease being treated with reinitiation of anticoagulation which had been held for surgery. Echocardiogram revealed LVEF 55-60% with G1DD, no WMA, elevated RV pressures and moderately reduced systolic function with dilated and blunted IVC. Diuresis was also provided, and with clinical stability, PT evaluation was requested, recommended home health PT at discharge, and transfer to hospitalist service on 5/17.  Assessment & Plan: Active Problems:   Acute hypoxemic respiratory failure (HCC)   Altered mental status  Acute hypoxemic and hypercarbic respiratory failure: Due to OHS, OSA, and likely pulmonary venous thromboembolic disease/pulmonary hypertension in the setting of holding anticoagulation.  - Continue supplemental oxygen to maintain SpO2 88-95% - OOB as much as possible - Restarted anticoagulation, converted back to xarelto which will be continued.  - Continue CPAP qHS  Acute diastolic CHF: L8VF on echo, preserved LVEF.  - Control BP - s/p diuresis, does not appear volume overloaded peripherally  Troponin elevation due to myocardial demand ischemia: Had chest tightness this morning that may be due to pulmonary clots. Troponin ordered and showing significant improvement, no anginal chest pain currently. ECG with stable T wave changes, no ischemic ST segment deviations. - Continue anticoagulation  S/p  scapholunate ligament reconstruction on 5/10:  - Orthopedics aware of admission, has been following, no immediate concerns. Plans to follow up as per routine in the outpatient setting between 10-15 days post operatively.  History of DVT/PE, current acute DVT of the left tibial and peroneal veins:  - Xarelto as above.  Morbid obesity: Estimated body mass index is 59.91 kg/m as calculated from the following:   Height as of this encounter: 5' 8" (1.727 m).   Weight as of this encounter: 178.7 kg.  DVT prophylaxis: Xarelto Code Status: Full Family Communication: None at bedside Disposition Plan:  Status is: Inpatient  Remains inpatient appropriate because:Remains with some chest tightness requiring evaluation, weaning oxygen.   Dispo: The patient is from: Home              Anticipated d/c is to: Home              Anticipated d/c date is: 1 day              Patient currently is not medically stable to d/c.  Consultants:   PCCM  Procedures:   None  Antimicrobials:  Ancef 5/10  Unasyn 5/11 - 5/14   Subjective: Shortness of breath improving, no lethargy. Eating well, eager to get up. Leg swelling improved.  Objective: Vitals:   11/28/19 0145 11/28/19 0415 11/28/19 0719 11/28/19 1158  BP: (!) 99/58 (!) 149/80 (!) 160/93 (!) 140/97  Pulse: (!) 54 (!) 49 (!) 53 (!) 54  Resp: _0 Temp:   97.8 F (36.6 C) 97.6 F (36.4 C)  TempSrc:  Axillary Axillary Axillary  SpO2: 98% 100% 98% 99%  Weight:      Height:        Intake/Output Summary (Last 24 hours) at  11/28/2019 1502 Last data filed at 11/28/2019 1330 Gross per 24 hour  Intake 1033.14 ml  Output --  Net 1033.14 ml   Filed Weights   11/25/19 0500 11/26/19 0500 11/27/19 0500  Weight: (!) 181.8 kg (!) 185.5 kg (!) 178.7 kg    Gen: 54 y.o. female in no distress Pulm: Non-labored breathing, distant without wheezes.  CV: Regular rate and rhythm. No murmur, rub, or gallop. No JVD, no pitting pedal edema. GI:  Abdomen soft, non-tender, non-distended, with normoactive bowel sounds. No organomegaly or masses felt. Ext: Warm, no deformities Skin: No rashes, lesions or ulcers Neuro: Alert and oriented. No focal neurological deficits. Psych: Judgement and insight appear normal. Mood & affect appropriate.   Data Reviewed: I have personally reviewed following labs and imaging studies  CBC: Recent Labs  Lab 11/22/19 1122 11/22/19 1151 11/23/19 0344 11/23/19 0344 11/24/19 0344 11/24/19 1608 11/25/19 0449 11/25/19 0449 11/25/19 0521 11/25/19 1408 11/25/19 1736 11/26/19 1017 11/27/19 0519  WBC 9.2   < > 8.0  --  5.7  --  4.8  --   --   --   --  4.6 3.5*  NEUTROABS 6.9  --   --   --  4.1  --   --   --   --   --   --  2.8  --   HGB 13.6   < > 12.4   < > 11.3*   < > 11.8*   < > 12.9 13.3 12.9 12.0 11.8*  HCT 48.3*   < > 42.9   < > 39.1   < > 40.7   < > 38.0 39.0 38.0 40.6 40.2  MCV 97.4   < > 95.3  --  95.8  --  96.2  --   --   --   --  92.9 93.3  PLT 252   < > 201  --  171  --  175  --   --   --   --  181 162   < > = values in this interval not displayed.   Basic Metabolic Panel: Recent Labs  Lab 11/23/19 0344 11/23/19 0344 11/23/19 1534 11/23/19 1534 11/24/19 0344 11/24/19 1608 11/25/19 0449 11/25/19 0521 11/25/19 1408 11/25/19 1736 11/26/19 1017  NA 139   < > 141   < > 139   < > 143 142 143 141 143  K 4.8   < > 4.9   < > 4.3   < > 4.3 4.2 4.1 4.0 4.0  CL 103  --  100  --  99  --  97*  --   --   --  93*  CO2 28  --  30  --  32  --  39*  --   --   --  40*  GLUCOSE 96  --  95  --  102*  --  109*  --   --   --  92  BUN 22*  --  23*  --  20  --  18  --   --   --  15  CREATININE 1.62*  --  1.31*  --  1.30*  --  1.12*  --   --   --  0.98  CALCIUM 8.5*  --  8.6*  --  8.4*  --  9.2  --   --   --  9.5  MG 2.2  --   --   --   --   --   --   --   --   --   --  PHOS 4.5  --   --   --   --   --   --   --   --   --   --    < > = values in this interval not displayed.   GFR: Estimated  Creatinine Clearance: 113.8 mL/min (by C-G formula based on SCr of 0.98 mg/dL). Liver Function Tests: Recent Labs  Lab 11/22/19 1122 11/24/19 0344  AST 115* 66*  ALT 54* 56*  ALKPHOS 53 36*  BILITOT 1.1 0.8  PROT 7.9 6.4*  ALBUMIN 3.7 3.2*   No results for input(s): LIPASE, AMYLASE in the last 168 hours. No results for input(s): AMMONIA in the last 168 hours. Coagulation Profile: No results for input(s): INR, PROTIME in the last 168 hours. Cardiac Enzymes: Recent Labs  Lab 11/22/19 1122  CKTOTAL 271*   BNP (last 3 results) No results for input(s): PROBNP in the last 8760 hours. HbA1C: No results for input(s): HGBA1C in the last 72 hours. CBG: Recent Labs  Lab 11/27/19 0751 11/27/19 1157 11/27/19 1641 11/27/19 2004 11/27/19 2311  GLUCAP 89 89 81 98 72   Lipid Profile: No results for input(s): CHOL, HDL, LDLCALC, TRIG, CHOLHDL, LDLDIRECT in the last 72 hours. Thyroid Function Tests: No results for input(s): TSH, T4TOTAL, FREET4, T3FREE, THYROIDAB in the last 72 hours. Anemia Panel: No results for input(s): VITAMINB12, FOLATE, FERRITIN, TIBC, IRON, RETICCTPCT in the last 72 hours. Urine analysis:    Component Value Date/Time   COLORURINE AMBER (A) 11/23/2019 0220   APPEARANCEUR CLOUDY (A) 11/23/2019 0220   LABSPEC 1.027 11/23/2019 0220   PHURINE 5.0 11/23/2019 0220   GLUCOSEU NEGATIVE 11/23/2019 0220   HGBUR NEGATIVE 11/23/2019 0220   BILIRUBINUR NEGATIVE 11/23/2019 0220   KETONESUR NEGATIVE 11/23/2019 0220   PROTEINUR 100 (A) 11/23/2019 0220   NITRITE NEGATIVE 11/23/2019 0220   LEUKOCYTESUR NEGATIVE 11/23/2019 0220   Recent Results (from the past 240 hour(s))  SARS Coronavirus 2 by RT PCR (hospital order, performed in [facility] hospital lab) Nasopharyngeal Nasopharyngeal Swab     Status: None   Collection Time: 11/22/19 10:31 AM   Specimen: Nasopharyngeal Swab  Result Value Ref Range Status   SARS Coronavirus 2 NEGATIVE NEGATIVE Final    Comment:  (NOTE) SARS-CoV-2 target nucleic acids are NOT DETECTED. The SARS-CoV-2 RNA is generally detectable in upper and lower respiratory specimens during the acute phase of infection. The lowest concentration of SARS-CoV-2 viral copies this assay can detect is 250 copies / mL. A negative result does not preclude SARS-CoV-2 infection and should not be used as the sole basis for treatment or other patient management decisions.  A negative result may occur with improper specimen collection / handling, submission of specimen other than nasopharyngeal swab, presence of viral mutation(s) within the areas targeted by this assay, and inadequate number of viral copies (<250 copies / mL). A negative result must be combined with clinical observations, patient history, and epidemiological information. Fact Sheet for Patients:   StrictlyIdeas.no Fact Sheet for Healthcare Providers: BankingDealers.co.za This test is not yet approved or cleared  by the [place] FDA and has been authorized for detection and/or diagnosis of SARS-CoV-2 by FDA under an Emergency Use Authorization (EUA).  This EUA will remain in effect (meaning this test can be used) for the duration of the COVID-19 declaration under Section 564(b)(1) of the Act, 21 U.S.C. section 360bbb-3(b)(1), unless the authorization is terminated or revoked sooner. Performed at [facility], [place],  [place] [address]   Blood culture (routine x 2)     Status: None   Collection Time: 11/22/19 11:00 AM   Specimen: BLOOD RIGHT HAND  Result Value Ref Range Status   Specimen Description BLOOD RIGHT HAND  Final   Special Requests   Final    BOTTLES DRAWN AEROBIC ONLY Blood Culture results may not be optimal due to an inadequate volume of blood received in culture bottles   Culture   Final    NO GROWTH 5 DAYS Performed at [facility], [place] [address] [place], [place] [address]     Report Status 11/27/2019 FINAL  Final  MRSA PCR Screening     Status: None   Collection Time: 11/22/19  2:14 PM   Specimen: Nasopharyngeal  Result Value Ref Range Status   MRSA by PCR NEGATIVE NEGATIVE Final    Comment:        The GeneXpert MRSA Assay (FDA approved for NASAL specimens only), is one component of a comprehensive MRSA colonization surveillance program. It is not intended to diagnose MRSA infection nor to guide or monitor treatment for MRSA infections. Performed at [facility], [place] [address], [place], [place] [address]   Blood culture (routine x 2)     Status: None   Collection Time: 11/22/19  4:42 PM   Specimen: BLOOD  Result Value Ref Range Status   Specimen Description BLOOD SITE NOT SPECIFIED  Final   Special Requests   Final    BOTTLES DRAWN AEROBIC AND ANAEROBIC Blood Culture adequate volume   Culture   Final    NO GROWTH 5 DAYS Performed at [facility], [address], [place], [place] [address]    Report Status 11/27/2019 FINAL  Final      Radiology Studies: No results found.  Scheduled Meds: . Chlorhexidine Gluconate Cloth  6 each Topical Daily  . insulin aspart  3-9 Units Subcutaneous TID WC  . rivaroxaban  15 mg Oral BID WC   Followed by  . [START ON 12/17/2019] rivaroxaban  20 mg Oral Q supper   Continuous Infusions:   LOS: 6 days   Time spent: 25 minutes.  Patrecia Pour, MD [facility] [email or web address] 11/28/2019, 3:02 PM

## 2019-11-28 NOTE — Progress Notes (Signed)
Progress noted, was conversant today. R UE unremarkable except for splint in place  Will plan to keep f/u with her 10-15 days from surgery on 5-10  Micheline Rough, MD [facility] [phone]

## 2019-11-29 DIAGNOSIS — N950 Postmenopausal bleeding: Secondary | ICD-10-CM

## 2019-11-29 LAB — GLUCOSE, CAPILLARY
Glucose-Capillary: 84 mg/dL (ref 70–99)
Glucose-Capillary: 88 mg/dL (ref 70–99)
Glucose-Capillary: 74 mg/dL (ref 70–99)
Glucose-Capillary: 99 mg/dL (ref 70–99)

## 2019-11-29 LAB — CBC
HCT: 42 % (ref 36.0–46.0)
Hemoglobin: 12.3 g/dL (ref 12.0–15.0)
MCH: 27.1 pg (ref 26.0–34.0)
MCHC: 29.3 g/dL — ABNORMAL LOW (ref 30.0–36.0)
MCV: 92.5 fL (ref 80.0–100.0)
Platelets: 162 10*3/uL (ref 150–400)
RBC: 4.54 MIL/uL (ref 3.87–5.11)
RDW: 14.6 % (ref 11.5–15.5)
WBC: 3.9 10*3/uL — ABNORMAL LOW (ref 4.0–10.5)
nRBC: 0 % (ref 0.0–0.2)

## 2019-11-29 MED ORDER — MEGESTROL ACETATE 40 MG PO TABS
40.0000 mg | ORAL_TABLET | Freq: Two times a day (BID) | ORAL | Status: DC
  Administered 2019-11-29 – 2019-12-01 (×5): 40 mg via ORAL
  Filled 2019-11-29 (×8): qty 1

## 2019-11-29 NOTE — Progress Notes (Signed)
RN noted dark red urine from pt this AM. MD on call was notified. No new orders received will pass to dayshift.

## 2019-11-29 NOTE — Progress Notes (Signed)
SecureChat sent to pt's nurse regarding order to discontinue central line. Nurse verbalized that she discontinued central line.

## 2019-11-29 NOTE — Progress Notes (Signed)
PROGRESS NOTE  Angel Brewer  BJY:[id number] DOB: [birth year] DOA: 11/22/2019 PCP: [name], NP   Brief Narrative: Angel Brewer is a 54 y.o. female with a history of DVT and PE (2017), morbid obesity (BMI 59), OSA on CPAP who presented 5/11 with dyspnea and confusion the day after  repeat surgery for scapholunate ligament reconstruction. She was admitted to the ICU requiring noninvasive ventilatory support. Lower extremity dopplers revealed acute left leg DVT, and, in addition to OHS/OSA, working diagnosis for acute respiratory decompensation is pulmonary venous thromboembolic disease being treated with reinitiation of anticoagulation which had been held for surgery. Echocardiogram revealed LVEF 55-60% with G1DD, no WMA, elevated RV pressures and moderately reduced systolic function with dilated and blunted IVC. Diuresis was also provided, and with clinical stability, PT evaluation was requested, recommended home health PT at discharge, and transfer to hospitalist service on 5/17.  Was planning on discharge on 5/18 with stable respiratory status, mental status at baseline, to home per PT recommendations. The patient expresses reluctance due to her diffuse weakness and also reports vaginal bleeding that began this morning. OB/GYN is consulted, their recommendation is appreciated.  Assessment & Plan: Active Problems:   Acute hypoxemic respiratory failure (HCC)   Altered mental status  Acute hypoxemic and hypercarbic respiratory failure: Due to OHS, OSA, and likely pulmonary venous thromboembolic disease/pulmonary hypertension in the setting of holding anticoagulation.  - Continue supplemental oxygen to maintain SpO2 88-95%. May need home oxygen. Wean as able. - OOB as much as possible - Restarted anticoagulation, converted back to xarelto which will be continued.  - Continue CPAP qHS  Acute vaginal bleeding: Reported per pt in setting of anticoagulation without pain, discharge, or hx AUB.   - Would appreciate OB/GYN recommendations, d/w Dr. Elly Modena. - Will continue DOAC as hgb is stable at 12.3.  Acute diastolic CHF: Y8MV on echo, preserved LVEF.  - Control BP - s/p diuresis, does not appear volume overloaded peripherally  Troponin elevation due to myocardial demand ischemia: Had chest tightness this morning that may be due to pulmonary clots. Troponin ordered and showing significant improvement, no anginal chest pain currently. ECG with stable T wave changes, no ischemic ST segment deviations. - Continue anticoagulation  S/p scapholunate ligament reconstruction on 5/10:  - Orthopedics aware of admission, has been following, no immediate concerns. Plans to follow up as per routine in the outpatient setting between 10-15 days post operatively.  History of DVT/PE, current acute DVT of the left tibial and peroneal veins:  - Xarelto as above.  Morbid obesity: Estimated body mass index is 59.91 kg/m as calculated from the following:   Height as of this encounter: _0  (1.727 m).   Weight as of this encounter: 178.7 kg.  DVT prophylaxis: Xarelto Code Status: Full Family Communication: None at bedside Disposition Plan:  Status is: Inpatient  Remains inpatient appropriate because:Remains with some chest tightness requiring evaluation, weaning oxygen. Also requires work up for vaginal bleeding which is new in setting of requiring anticoagulation.   Dispo: The patient is from: Home              Anticipated d/c is to: Home ?if CIR would be appropriate.              Anticipated d/c date is: 1 day pending consultant recommendations and PT reevaluation.               Patient currently is not medically stable to d/c.  Consultants:   PCCM  Procedures:  None  Antimicrobials:  Ancef 5/10  Unasyn 5/11 - 5/14   Subjective: Still some short of breath, no further chest pain/tightness reported. Noticed vaginal bleeding this morning in the bathroom. This is new, has never  had bleeding since menopause years ago. No abd, pelvic, or vaginal pain, no trauma, no hematuria or blood in stool, no dysuria. No vaginal discharge. No other bleeding.   She states she doesn't have enough assistance at home to be discharged right now, needs more therapy.   Objective: Vitals:   11/29/19 0327 11/29/19 0412 11/29/19 0734 11/29/19 1144  BP: 139/82 139/82 (!) 157/91 135/89  Pulse: (!) 56 (!) 48 (!) 56 (!) 50  Resp: 16 16 (!) 22 (!) 21  Temp: 97.6 F (36.4 C)  97.9 F (36.6 C) 98.2 F (36.8 C)  TempSrc: Axillary  Oral Oral  SpO2:  99% 97% 99%  Weight:      Height:        Intake/Output Summary (Last 24 hours) at 11/29/2019 1208 Last data filed at 11/29/2019 7106 Gross per 24 hour  Intake 800 ml  Output --  Net 800 ml   Filed Weights   11/25/19 0500 11/26/19 0500 11/27/19 0500  Weight: (!) 181.8 kg (!) 185.5 kg (!) 178.7 kg   Gen: Morbidly obese female in no distress Pulm: Nonlabored breathing 2L, distant but clear. CV: Regular rate and rhythm. No murmur, rub, or gallop. No JVD, no pitting dependent edema. GI: Abdomen soft, non-tender, non-distended, no suprapubic tenderness with normoactive bowel sounds.  Pelvic:  Will require assistance. Ext: Warm, no deformities Skin: No rashes, lesions or ulcers on visualized skin. Neuro: Alert and oriented. No focal neurological deficits. Psych: Judgement and insight appear fair. Mood euthymic & affect congruent. Behavior is appropriate.    Data Reviewed: I have personally reviewed following labs and imaging studies  CBC: Recent Labs  Lab 11/24/19 0344 11/24/19 1608 11/25/19 0449 11/25/19 0521 11/25/19 1408 11/25/19 1736 11/26/19 1017 11/27/19 0519 11/29/19 1149  WBC 5.7  --  4.8  --   --   --  4.6 3.5* 3.9*  NEUTROABS 4.1  --   --   --   --   --  2.8  --   --   HGB 11.3*   < > 11.8*   < > 13.3 12.9 12.0 11.8* 12.3  HCT 39.1   < > 40.7   < > 39.0 38.0 40.6 40.2 42.0  MCV 95.8  --  96.2  --   --   --  92.9 93.3  92.5  PLT 171  --  175  --   --   --  181 162 162   < > = values in this interval not displayed.   Basic Metabolic Panel: Recent Labs  Lab 11/23/19 0344 11/23/19 0344 11/23/19 1534 11/23/19 1534 11/24/19 0344 11/24/19 1608 11/25/19 0449 11/25/19 0521 11/25/19 1408 11/25/19 1736 11/26/19 1017  NA 139   < > 141   < > 139   < > 143 142 143 141 143  K 4.8   < > 4.9   < > 4.3   < > 4.3 4.2 4.1 4.0 4.0  CL 103  --  100  --  99  --  97*  --   --   --  93*  CO2 28  --  30  --  32  --  39*  --   --   --  40*  GLUCOSE 96  --  95  --  102*  --  109*  --   --   --  92  BUN 22*  --  23*  --  20  --  18  --   --   --  15  CREATININE 1.62*  --  1.31*  --  1.30*  --  1.12*  --   --   --  0.98  CALCIUM 8.5*  --  8.6*  --  8.4*  --  9.2  --   --   --  9.5  MG 2.2  --   --   --   --   --   --   --   --   --   --   PHOS 4.5  --   --   --   --   --   --   --   --   --   --    < > = values in this interval not displayed.   GFR: Estimated Creatinine Clearance: 113.8 mL/min (by C-G formula based on SCr of 0.98 mg/dL). Liver Function Tests: Recent Labs  Lab 11/24/19 0344  AST 66*  ALT 56*  ALKPHOS 36*  BILITOT 0.8  PROT 6.4*  ALBUMIN 3.2*   No results for input(s): LIPASE, AMYLASE in the last 168 hours. No results for input(s): AMMONIA in the last 168 hours. Coagulation Profile: No results for input(s): INR, PROTIME in the last 168 hours. Cardiac Enzymes: No results for input(s): CKTOTAL, CKMB, CKMBINDEX, TROPONINI in the last 168 hours. BNP (last 3 results) No results for input(s): PROBNP in the last 8760 hours. HbA1C: No results for input(s): HGBA1C in the last 72 hours. CBG: Recent Labs  Lab 11/28/19 0716 11/28/19 1155 11/28/19 1614 11/28/19 2138 11/29/19 0748  GLUCAP 77 70 91 100* 84   Lipid Profile: No results for input(s): CHOL, HDL, LDLCALC, TRIG, CHOLHDL, LDLDIRECT in the last 72 hours. Thyroid Function Tests: No results for input(s): TSH, T4TOTAL, FREET4, T3FREE,  THYROIDAB in the last 72 hours. Anemia Panel: No results for input(s): VITAMINB12, FOLATE, FERRITIN, TIBC, IRON, RETICCTPCT in the last 72 hours. Urine analysis:    Component Value Date/Time   COLORURINE AMBER (A) 11/23/2019 0220   APPEARANCEUR CLOUDY (A) 11/23/2019 0220   LABSPEC 1.027 11/23/2019 0220   PHURINE 5.0 11/23/2019 0220   GLUCOSEU NEGATIVE 11/23/2019 0220   HGBUR NEGATIVE 11/23/2019 0220   BILIRUBINUR NEGATIVE 11/23/2019 0220   KETONESUR NEGATIVE 11/23/2019 0220   PROTEINUR 100 (A) 11/23/2019 0220   NITRITE NEGATIVE 11/23/2019 0220   LEUKOCYTESUR NEGATIVE 11/23/2019 0220   Recent Results (from the past 240 hour(s))  SARS Coronavirus 2 by RT PCR (hospital order, performed in [facility] hospital lab) Nasopharyngeal Nasopharyngeal Swab     Status: None   Collection Time: 11/22/19 10:31 AM   Specimen: Nasopharyngeal Swab  Result Value Ref Range Status   SARS Coronavirus 2 NEGATIVE NEGATIVE Final    Comment: (NOTE) SARS-CoV-2 target nucleic acids are NOT DETECTED. The SARS-CoV-2 RNA is generally detectable in upper and lower respiratory specimens during the acute phase of infection. The lowest concentration of SARS-CoV-2 viral copies this assay can detect is 250 copies / mL. A negative result does not preclude SARS-CoV-2 infection and should not be used as the sole basis for treatment or other patient management decisions.  A negative result may occur with improper specimen collection / handling, submission of specimen other than nasopharyngeal swab, presence of viral mutation(s) within the areas targeted  by this assay, and inadequate number of viral copies (<250 copies / mL). A negative result must be combined with clinical observations, patient history, and epidemiological information. Fact Sheet for Patients:   StrictlyIdeas.no Fact Sheet for Healthcare Providers: BankingDealers.co.za This test is not yet approved or  cleared  by the [place] FDA and has been authorized for detection and/or diagnosis of SARS-CoV-2 by FDA under an Emergency Use Authorization (EUA).  This EUA will remain in effect (meaning this test can be used) for the duration of the COVID-19 declaration under Section 564(b)(1) of the Act, 21 U.S.C. section 360bbb-3(b)(1), unless the authorization is terminated or revoked sooner. Performed at [facility], [place] [address], [place], [place] [address]   Blood culture (routine x 2)     Status: None   Collection Time: 11/22/19 11:00 AM   Specimen: BLOOD RIGHT HAND  Result Value Ref Range Status   Specimen Description BLOOD RIGHT HAND  Final   Special Requests   Final    BOTTLES DRAWN AEROBIC ONLY Blood Culture results may not be optimal due to an inadequate volume of blood received in culture bottles   Culture   Final    NO GROWTH 5 DAYS Performed at [facility], [place] [address] [place], [place] [address]    Report Status 11/27/2019 FINAL  Final  MRSA PCR Screening     Status: None   Collection Time: 11/22/19  2:14 PM   Specimen: Nasopharyngeal  Result Value Ref Range Status   MRSA by PCR NEGATIVE NEGATIVE Final    Comment:        The GeneXpert MRSA Assay (FDA approved for NASAL specimens only), is one component of a comprehensive MRSA colonization surveillance program. It is not intended to diagnose MRSA infection nor to guide or monitor treatment for MRSA infections. Performed at [facility], [place] [address] [place], [place] [address]   Blood culture (routine x 2)     Status: None   Collection Time: 11/22/19  4:42 PM   Specimen: BLOOD  Result Value Ref Range Status   Specimen Description BLOOD SITE NOT SPECIFIED  Final   Special Requests   Final    BOTTLES DRAWN AEROBIC AND ANAEROBIC Blood Culture adequate volume   Culture   Final    NO GROWTH 5 DAYS Performed at [facility], [address] [place], [place] [address]    Report Status  11/27/2019 FINAL  Final      Radiology Studies: No results found.  Scheduled Meds: . Chlorhexidine Gluconate Cloth  6 each Topical Daily  . insulin aspart  3-9 Units Subcutaneous TID WC  . rivaroxaban  15 mg Oral BID WC   Followed by  . [START ON 12/17/2019] rivaroxaban  20 mg Oral Q supper   Continuous Infusions:   LOS: 7 days   Time spent: 25 minutes.  Patrecia Pour, MD [facility] [email or web address] 11/29/2019, 12:08 PM

## 2019-11-29 NOTE — Consult Note (Signed)
Reason for Consult:postmenopausal vaginal bleeding   Angel Brewer is an 54 y.o. female P3 postmenopausal for approximately 4-5 years with BMI 59 with new onset vaginal bleeding. Patient admitted with difficulty breathing following right wrist surgery. Patient with a history of DVT/PE previously on anticoagulation and recently restarted on anticoagulation. Patient denies any pelvic pain or abnormal discharge. Patient denies any history of postmenopausal bleeding before today. She states that vaginal bleeding started following the change of foley catheter 3 days ago. She describes noticing blood in her commode but not a pad or her sheets. She denies any vaginal discomfort. Patient has not seen a gynecologist in years.   Menstrual History: Patient's last menstrual period was 11/29/2015.    Past Medical History:  Diagnosis Date  . DVT (deep vein thrombosis) in pregnancy    RLE DVT 02/2016  . Dyspnea   . Morbid obesity ([place])   . PE (pulmonary embolism) 02/29/2016  . Peripheral vascular disease ([place])   . Sleep apnea    uses a cpap    Past Surgical History:  Procedure Laterality Date  . CESAREAN SECTION     x 3  . OPEN REDUCTION INTERNAL FIXATION (ORIF) DISTAL RADIAL FRACTURE Right 06/20/2019   Procedure: OPEN REDUCTION INTERNAL FIXATION (ORIF) DISTAL RADIAL FRACTURE;  Surgeon: [name], MD;  Location: [facility];  Service: Orthopedics;  Laterality: Right;  REGIONAL BLOCK TOTAL SURGERY TIME: 90 MINUTES  . SYNOVECTOMY Right 11/21/2019   Procedure: RIGHT WRIST LIGAMENT RECONSTRUCTION;  Surgeon: [name], MD;  Location: [facility];  Service: Orthopedics;  Laterality: Right;  PROCEDURE: RIGHT WRIST LIGAMENT RECONSTRUCTION LENGTH OF SURGERY: 105 MIN    No family history on file.  Social History:  reports that she has quit smoking. She has never used smokeless tobacco. She reports that she does not drink alcohol or use drugs.  Allergies: No Known Allergies  Medications: I have reviewed  the patient's current medications.  Review of Systems See pertinent in HPI Blood pressure 135/89, pulse (!) 50, temperature 98.2 F (36.8 C), temperature source Oral, resp. rate (!) 21, height _0  (1.727 m), weight (!) 178.7 kg, last menstrual period 11/29/2015, SpO2 99 %. Physical Exam GENERAL: Well-developed, well-nourished female in no acute distress, sitting in a chair ABDOMEN: Soft, nontender, nondistended. No organomegaly. PELVIC: difficult exam secondary to body habitus and lack of GYN bed EXTREMITIES: No cyanosis, clubbing, or edema, 2+ distal pulses.  Results for orders placed or performed during the hospital encounter of 11/22/19 (from the past 48 hour(s))  Glucose, capillary     Status: None   Collection Time: 11/27/19  4:41 PM  Result Value Ref Range   Glucose-Capillary 81 70 - 99 mg/dL    Comment: Glucose reference range applies only to samples taken after fasting for at least 8 hours.  Glucose, capillary     Status: None   Collection Time: 11/27/19  8:04 PM  Result Value Ref Range   Glucose-Capillary 98 70 - 99 mg/dL    Comment: Glucose reference range applies only to samples taken after fasting for at least 8 hours.  Glucose, capillary     Status: None   Collection Time: 11/27/19 11:11 PM  Result Value Ref Range   Glucose-Capillary 72 70 - 99 mg/dL    Comment: Glucose reference range applies only to samples taken after fasting for at least 8 hours.  Glucose, capillary     Status: None   Collection Time: 11/28/19  7:16 AM  Result Value Ref Range  Glucose-Capillary 77 70 - 99 mg/dL    Comment: Glucose reference range applies only to samples taken after fasting for at least 8 hours.  Troponin I (High Sensitivity)     Status: Abnormal   Collection Time: 11/28/19 10:34 AM  Result Value Ref Range   Troponin I (High Sensitivity) 44 (H) <18 ng/L    Comment: (NOTE) Elevated high sensitivity troponin I (hsTnI) values and significant  changes across serial measurements  may suggest ACS but many other  chronic and acute conditions are known to elevate hsTnI results.  Refer to the "Links" section for chest pain algorithms and additional  guidance. Performed at [facility], [place] [address], [place], [place] [address]   Glucose, capillary     Status: None   Collection Time: 11/28/19 11:55 AM  Result Value Ref Range   Glucose-Capillary 70 70 - 99 mg/dL    Comment: Glucose reference range applies only to samples taken after fasting for at least 8 hours.  Troponin I (High Sensitivity)     Status: Abnormal   Collection Time: 11/28/19  1:16 PM  Result Value Ref Range   Troponin I (High Sensitivity) 44 (H) <18 ng/L    Comment: (NOTE) Elevated high sensitivity troponin I (hsTnI) values and significant  changes across serial measurements may suggest ACS but many other  chronic and acute conditions are known to elevate hsTnI results.  Refer to the "Links" section for chest pain algorithms and additional  guidance. Performed at [facility], [place] [address], [place], [place] [address]   Glucose, capillary     Status: None   Collection Time: 11/28/19  4:14 PM  Result Value Ref Range   Glucose-Capillary 91 70 - 99 mg/dL    Comment: Glucose reference range applies only to samples taken after fasting for at least 8 hours.  Glucose, capillary     Status: Abnormal   Collection Time: 11/28/19  9:38 PM  Result Value Ref Range   Glucose-Capillary 100 (H) 70 - 99 mg/dL    Comment: Glucose reference range applies only to samples taken after fasting for at least 8 hours.  Glucose, capillary     Status: None   Collection Time: 11/29/19  7:48 AM  Result Value Ref Range   Glucose-Capillary 84 70 - 99 mg/dL    Comment: Glucose reference range applies only to samples taken after fasting for at least 8 hours.  Glucose, capillary     Status: None   Collection Time: 11/29/19 11:41 AM  Result Value Ref Range   Glucose-Capillary 88 70 - 99 mg/dL    Comment:  Glucose reference range applies only to samples taken after fasting for at least 8 hours.  CBC     Status: Abnormal   Collection Time: 11/29/19 11:49 AM  Result Value Ref Range   WBC 3.9 (L) 4.0 - 10.5 K/uL   RBC 4.54 3.87 - 5.11 MIL/uL   Hemoglobin 12.3 12.0 - 15.0 g/dL   HCT 42.0 36.0 - 46.0 %   MCV 92.5 80.0 - 100.0 fL   MCH 27.1 26.0 - 34.0 pg   MCHC 29.3 (L) 30.0 - 36.0 g/dL   RDW 14.6 11.5 - 15.5 %   Platelets 162 150 - 400 K/uL   nRBC 0.0 0.0 - 0.2 %    Comment: Performed at [facility], [place] [address] [place], [place] [address]    No results found.  Assessment/Plan: 54 yo with postmenopausal vaginal bleeding -  Suspect vaginal bleeding likely due to recent restart of anticoagulation - Will start megace 40 BID and patient should be continued on that medication until outpatient evaluation with endometrial biopsy - If possible, a pelvic ultrasound (transvaginal) would be helpful if patient were to remain inpatient. Otherwise this will be ordered as an outpatient study - Will schedule outpatient appointment for the evaluation of her postmenopausal vaginal bleeding and pap smear/mammogram - Thank you for this consult. Please do not hesitate to contact GYN on call at [phone] with further questions  Krystl Wickware 11/29/2019

## 2019-11-29 NOTE — Progress Notes (Signed)
Physical Therapy Treatment Patient Details Name: Ginger Leeth MRN: [id number] DOB: [birth year] Today's Date: 11/29/2019    History of Present Illness 54 yo female with PMH of DVT and PE on Xarelto (2017) and OSA on home CPAP presented with SOB and AMS. She had right wrist surgery on 5/10 for reconstruction scapholunate ligament and had stopped her Xarelto on 5/6 in preparation for surgery. She was advised to remain off Xarelto for 10 days post-op. She had been experiencing SOB and chest pain the days leading up to her surgery. She was found at home confused and on the floor by her niece.    PT Comments    Patient progressing to hallway ambulation with RW this session.  SpO2 dropping to 79% on RA with ambulation and pt symptomatic so O2 applied 3-4LPM then titrated during ambulation back to 2L keeping sats in 90's.  Son here from [place] to visit, niece in the room, but no one who can assist her at home.  Feel she may benefit from short CIR stay to return to independent prior to d/c home.  PT to follow acutely.   Follow Up Recommendations  CIR     Equipment Recommendations  Rolling walker with 5" wheels(bariatric)    Recommendations for Other Services       Precautions / Restrictions Precautions Precautions: Fall Precaution Comments: watch O2 Restrictions Other Position/Activity Restrictions: watch R wrist, no specific weight bearing orders, but protected nonetheless    Mobility  Bed Mobility               General bed mobility comments: up in chair  Transfers Overall transfer level: Needs assistance   Transfers: Sit to/from Stand Sit to Stand: Min assist;+2 safety/equipment         General transfer comment: up from recliner mod cues for hand placement/technique assisted with momentum to get on her feet +2 for comfort due to "legs feeling wobbly"  Ambulation/Gait Ambulation/Gait assistance: Supervision;Min guard;+2 safety/equipment Gait Distance (Feet): 60 Feet(with  chair following) Assistive device: Rolling walker (2 wheeled) Gait Pattern/deviations: Step-to pattern;Step-through pattern;Decreased stride length;Wide base of support     General Gait Details: continued wide BOS with limited foot clearance with increased lateral sway with RW kept R hand resting on hand grip; on RA initially SpO2 down to 79%, back to 85% with 2L applied, then up to 3-4L SpO2 93%; weaned back to 2L with SpO2 stabilized at 92%   Stairs             Wheelchair Mobility    Modified Rankin (Stroke Patients Only)       Balance Overall balance assessment: Needs assistance   Sitting balance-[name]: Good       Standing balance-[name]: Poor Standing balance comment: UE support needed in standing                            Cognition Arousal/Alertness: Awake/alert Behavior During Therapy: WFL for tasks assessed/performed Overall Cognitive Status: Within Functional Limits for tasks assessed                                        Exercises      General Comments General comments (skin integrity, edema, etc.): son in room visiting from [place]; neice states cannot stay with her as has Gru[place] ones she helps with at home; discussed d/c options  Pertinent Vitals/Pain Pain Assessment: Faces Faces Pain Scale: Hurts a Boule bit Pain Location: generalized Pain Descriptors / Indicators: Grimacing;Discomfort Pain Intervention(s): Monitored during session;Repositioned    Home Living                      Prior Function            PT Goals (current goals can now be found in the care plan section) Progress towards PT goals: Progressing toward goals    Frequency    Min 3X/week      PT Plan Discharge plan needs to be updated;Equipment recommendations need to be updated    Co-evaluation              AM-PAC PT "6 Clicks" Mobility   Outcome Measure  Help needed turning from your back to your side while  in a flat bed without using bedrails?: A [place]sky Help needed moving from lying on your back to sitting on the side of a flat bed without using bedrails?: A Reznick Help needed moving to and from a bed to a chair (including a wheelchair)?: A Males Help needed standing up from a chair using your arms (e.g., wheelchair or bedside chair)?: A Cervi Help needed to walk in hospital room?: A Mcclane Help needed climbing 3-5 steps with a railing? : Total 6 Click Score: 16    End of Session Equipment Utilized During Treatment: Oxygen Activity Tolerance: Patient limited by fatigue Patient left: in chair;with call bell/phone within reach;with family/visitor present   PT Visit Diagnosis: Other abnormalities of gait and mobility (R26.89);Muscle weakness (generalized) (M62.81)     Time: 6701-4103 PT Time Calculation (min) (ACUTE ONLY): 25 min  Charges:  $Gait Training: 23-37 mins                     Magda Kiel, [place] [phone] 11/29/2019    Reginia Naas 11/29/2019, 12:20 PM

## 2019-11-30 LAB — BASIC METABOLIC PANEL
Anion gap: 12 (ref 5–15)
BUN: 16 mg/dL (ref 6–20)
CO2: 33 mmol/L — ABNORMAL HIGH (ref 22–32)
Calcium: 9 mg/dL (ref 8.9–10.3)
Chloride: 96 mmol/L — ABNORMAL LOW (ref 98–111)
Creatinine, Ser: 0.95 mg/dL (ref 0.44–1.00)
GFR calc Af Amer: >60 mL/min (ref >60)
GFR calc non Af Amer: >60 mL/min (ref >60)
Glucose, Bld: 93 mg/dL (ref 70–99)
Potassium: 3.7 mmol/L (ref 3.5–5.1)
Sodium: 141 mmol/L (ref 135–145)

## 2019-11-30 LAB — CBC
HCT: 41.4 % (ref 36.0–46.0)
Hemoglobin: 12.4 g/dL (ref 12.0–15.0)
MCH: 27.7 pg (ref 26.0–34.0)
MCHC: 30 g/dL (ref 30.0–36.0)
MCV: 92.4 fL (ref 80.0–100.0)
Platelets: 144 10*3/uL — ABNORMAL LOW (ref 150–400)
RBC: 4.48 MIL/uL (ref 3.87–5.11)
RDW: 14.6 % (ref 11.5–15.5)
WBC: 3.8 10*3/uL — ABNORMAL LOW (ref 4.0–10.5)
nRBC: 0 % (ref 0.0–0.2)

## 2019-11-30 LAB — GLUCOSE, CAPILLARY
Glucose-Capillary: 67 mg/dL — ABNORMAL LOW (ref 70–99)
Glucose-Capillary: 83 mg/dL (ref 70–99)
Glucose-Capillary: 64 mg/dL — ABNORMAL LOW (ref 70–99)
Glucose-Capillary: 106 mg/dL — ABNORMAL HIGH (ref 70–99)
Glucose-Capillary: 82 mg/dL (ref 70–99)

## 2019-11-30 NOTE — Progress Notes (Addendum)
Blood sugars running borderline low today, no insulin given at any scheduled time today, 11/30/19. Pt. Is asymptomatic, no change in LOC, vitals signs, skin warm and dry, oriented. Given orange juice this am and again this pm. Pt states she ate only at chicken at lunch time. Eating 90% of dinner plus juices. Will recheck.   OT evaluated today and patient reportedly desaturated during evaluation.   Reportedly not eligible for CIR, but suggested to OT she would consider a SNF as she does not feel she can manage at home at this point.   Provider paged to notify of CBG trending. 1848 recheck CBG 108. Pt awake, alert, oriented. Simmie Davies RN

## 2019-11-30 NOTE — Evaluation (Signed)
Occupational Therapy Evaluation Patient Details Name: Angel Brewer MRN: [id number] DOB: 04-11-1966 Today's Date: 11/30/2019    History of Present Illness 54 yo female with PMH of DVT and PE on Xarelto (2017) and OSA on home CPAP presented with SOB and AMS. She had right wrist surgery on 5/10 for reconstruction scapholunate ligament and had stopped her Xarelto on 5/6 in preparation for surgery. She was advised to remain off Xarelto for 10 days post-op. She had been experiencing SOB and chest pain the days leading up to her surgery. She was found at home confused and on the floor by her niece.   Clinical Impression   Patient is s/p R wrist surgery resulting in functional limitations due to the deficits listed below (see OT problem list). Pt requires oxygen at this time 2L for supine positioning. Pt educated on energy conservation and management of R UE. Pt has AE at home (reacher, sock aide and long handle sponge) Pt will have niece (A) initially with d/c home but will be alone. Pt does not feel safe to d/c with oxygen and R Ue limitations at this time. Pt states if I can go home without oxygen maybe but I cant pull this cord around my house.  Patient will benefit from skilled OT acutely to increase independence and safety with ADLS to allow discharge SNF ( lives in [place]).     Follow Up Recommendations  SNF    Equipment Recommendations  None recommended by OT    Recommendations for Other Services Other (comment)(CIR denial noted in chart at this time)     Precautions / Restrictions Precautions Precautions: Fall Precaution Comments: watch O2 Restrictions Weight Bearing Restrictions: No Other Position/Activity Restrictions: watch R wrist, no specific weight bearing orders, but protected nonetheless      Mobility Bed Mobility Overal bed mobility: Needs Assistance Bed Mobility: Supine to Sit     Supine to sit: Mod assist     General bed mobility comments: pt required mod  (A) from RN tech to place legs back on bed surface during transfer. Pt requires use of HOB elevation for oob. pt reports sleeping in recliner after last wrist surgery  Transfers                      Balance Overall balance assessment: Needs assistance                                         ADL either performed or assessed with clinical judgement   ADL Overall ADL's : Needs assistance/impaired Eating/Feeding: Modified independent                       Toilet Transfer: Minimal assistance             General ADL Comments: pt up in chair and returning to bed with tech on arrival. pt up in chair for long duration of time per staff. pt agreeable to bed level participation at this time and patiently engaged.      Vision         Perception     Praxis      Pertinent Vitals/Pain Pain Assessment: Faces Faces Pain Scale: Hurts a Blish bit Pain Location: generalized Pain Descriptors / Indicators: Grimacing;Discomfort     Hand Dominance Right   Extremity/Trunk Assessment Upper Extremity Assessment Upper Extremity Assessment: RUE deficits/detail RUE  Deficits / Details: able to move digits   Lower Extremity Assessment Lower Extremity Assessment: Generalized weakness       Communication Communication Communication: No difficulties   Cognition Arousal/Alertness: Awake/alert Behavior During Therapy: WFL for tasks assessed/performed Overall Cognitive Status: Within Functional Limits for tasks assessed                                     General Comments  noted on RA 88% Pt requires 2 L to with O2 sat 96-98%     Exercises Other Exercises Other Exercises: educated onenergy conservation with handout. discussed rehab before home because pt states I cant have one hand hurt and pull that tube everywhere. pt does not feel safe going home with oxygen with R UE injury   Shoulder Instructions      Home Living Family/patient  expects to be discharged to:: Private residence Living Arrangements: Alone Available Help at Discharge: Family;Available 24 hours/day Type of Home: House Home Access: Level entry     Home Layout: Able to live on main level with bedroom/bathroom     Bathroom Shower/Tub: Occupational psychologist: Handicapped height     Home Equipment: None   Additional Comments: reports a niece and son if he can come from [place]      Prior Functioning/Environment Level of Independence: Independent                 OT Problem List: Decreased strength;Decreased activity tolerance;Impaired balance (sitting and/or standing);Decreased safety awareness;Decreased knowledge of use of DME or AE;Decreased knowledge of precautions;Obesity;Pain      OT Treatment/Interventions: Self-care/ADL training;Therapeutic exercise;Neuromuscular education;Energy conservation;DME and/or AE instruction;Manual therapy;Modalities;Therapeutic activities;Patient/family education;Balance training    OT Goals(Current goals can be found in the care plan section) Acute Rehab OT Goals Patient Stated Goal: to be able to manage at home by myself OT Goal Formulation: With patient Time For Goal Achievement: 12/14/19 Potential to Achieve Goals: Good  OT Frequency: Min 3X/week   Barriers to D/C: Decreased caregiver support          Co-evaluation              AM-PAC OT "6 Clicks" Daily Activity     Outcome Measure Help from another person eating meals?: A [place]wal[place] Help from another person taking care of personal grooming?: A Hands Help from another person toileting, which includes using toliet, bedpan, or urinal?: A Samudio Help from another person bathing (including washing, rinsing, drying)?: A [place] Help from another person to put on and taking off regular upper body clothing?: A Wich Help from another person to put on and taking off regular lower body clothing?: A Lot 6 Click Score: 17   End of Session  Equipment Utilized During Treatment: Oxygen Nurse Communication: Mobility status;Precautions  Activity Tolerance: Patient tolerated treatment well Patient left: in bed;with call bell/phone within reach  OT Visit Diagnosis: Unsteadiness on feet (R26.81);Muscle weakness (generalized) (M62.81)                Time: 8614-8307 OT Time Calculation (min): 38 min Charges:  OT General Charges $OT Visit: 1 Visit OT Evaluation $OT Eval Moderate Complexity: 1 Mod OT Treatments $Self Care/Home Management : 8-22 mins   Brynn, OTR/L  Acute Rehabilitation Services Pager: [phone] Office: [phone] .   Jeri Modena 11/30/2019, 4:12 PM

## 2019-11-30 NOTE — Progress Notes (Addendum)
PROGRESS NOTE  Angel Brewer  MGQ:[id number] DOB: 12-09-1965 DOA: 11/22/2019 PCP: [name], NP   Brief Narrative: Angel Brewer is a 54 y.o. female with a history of DVT and PE (2017), morbid obesity (BMI 59), OSA on CPAP who presented 5/11 with dyspnea and confusion the day after  repeat surgery for scapholunate ligament reconstruction. She was admitted to the ICU requiring noninvasive ventilatory support. Lower extremity dopplers revealed acute left leg DVT, and, in addition to OHS/OSA, working diagnosis for acute respiratory decompensation is pulmonary venous thromboembolic disease being treated with reinitiation of anticoagulation which had been held for surgery. Echocardiogram revealed LVEF 55-60% with G1DD, no WMA, elevated RV pressures and moderately reduced systolic function with dilated and blunted IVC. Diuresis was also provided, and with clinical stability, PT evaluation was requested, recommended home health PT at discharge, and transfer to hospitalist service on 5/17.  The patient continues to require supplemental oxygen and is not able to mobilize safely on her own. Disposition to SNF is being sought.   Assessment & Plan: Active Problems:   Acute hypoxemic respiratory failure (HCC)   Altered mental status  Acute hypoxemic and hypercarbic respiratory failure: Due to OHS, OSA, and likely pulmonary venous thromboembolic disease/pulmonary hypertension in the setting of holding anticoagulation.  - Continue supplemental oxygen to maintain SpO2 88-95%. Anticipate protracted recovery and need to DC with home oxygen. - OOB as much as possible - Restarted anticoagulation, converted back to xarelto which will be continued.  - Continue CPAP qHS  Acute vaginal bleeding: Reported per pt in setting of anticoagulation without pain, discharge, or hx AUB.  - OB/GYN started megace 76m po BID which will continue until outpatient follow up, ideally with pelvic U/S and possibly endometrial  sampling. - Monitoring hgb which is stable, though bleeding continues. - Will continue DOAC    Acute diastolic CHF: GO6ZTon echo, preserved LVEF.  - Control BP - s/p diuresis, does not appear volume overloaded peripherally  Troponin elevation due to myocardial demand ischemia: - Recommend outpatient follow up with cardiology - Continue anticoagulation  S/p scapholunate ligament reconstruction on 5/10:  - Orthopedics aware of admission, has been following, no immediate concerns. Plans to follow up as per routine in the outpatient setting between 10-15 days post operatively.  History of DVT/PE, current acute DVT of the left tibial and peroneal veins:  - Xarelto as above.  Morbid obesity: Estimated body mass index is 59.91 kg/m as calculated from the following:   Height as of this encounter: 5' 8" (1.727 m).   Weight as of this encounter: 178.7 kg.  DVT prophylaxis: Xarelto Code Status: Full Family Communication: None at bedside Disposition Plan:  Status is: Inpatient  Remains inpatient appropriate because:Remains with some chest tightness requiring evaluation, weaning oxygen. Also requires work up for vaginal bleeding which is new in setting of requiring anticoagulation.   Dispo: The patient is from: Home              Anticipated d/c is to: SNF.               Anticipated d/c date is: 1 day  as we have an unsafe DC plan. Pt unable to use oxygen (which she currently needs) with RUE restrictions. CIR declined patient. CSW consulted              Patient currently is not medically stable to d/c.  Consultants:   PCCM  Procedures:   None  Antimicrobials:  Ancef 5/10  Unasyn 5/11 -  5/14   Subjective: No change in vaginal bleeding thus far. She's unable to get assistance at home and can't navigate with her RUE surgical restrictions and use supplemental oxygen as well.   Objective: Vitals:   11/29/19 2344 11/30/19 0409 11/30/19 0803 11/30/19 1131  BP:  136/79 (!) 141/54  118/87  Pulse:  (!) 48 (!) 49 (!) 51  Resp:  _0 Temp: 98 F (36.7 C) 97.9 F (36.6 C) 98.9 F (37.2 C) 97.9 F (36.6 C)  TempSrc: Oral Oral Oral Axillary  SpO2:   99% 100%  Weight:      Height:        Intake/Output Summary (Last 24 hours) at 11/30/2019 1651 Last data filed at 11/30/2019 1433 Gross per 24 hour  Intake 750 ml  Output --  Net 750 ml   Filed Weights   11/25/19 0500 11/26/19 0500 11/27/19 0500  Weight: (!) 181.8 kg (!) 185.5 kg (!) 178.7 kg   Gen: 54 y.o. female in no distress Pulm: Nonlabored breathing 2L O2 at rest, tachypneic. Clear, distant. CV: Regular rate and rhythm. No murmur, rub, or gallop. No JVD, no pitting dependent edema. GI: Abdomen soft, non-tender, non-distended, with normoactive bowel sounds.  Ext: Warm, no deformities. Right wrist in cast. Skin: No new rashes, lesions or ulcers on visualized skin. Neuro: Alert and oriented. No focal neurological deficits. Psych: Judgement and insight appear fair. Mood euthymic & affect congruent. Behavior is appropriate.    Data Reviewed: I have personally reviewed following labs and imaging studies  CBC: Recent Labs  Lab 11/24/19 0344 11/24/19 1608 11/25/19 0449 11/25/19 0521 11/25/19 1736 11/26/19 1017 11/27/19 0519 11/29/19 1149 11/30/19 0348  WBC 5.7  --  4.8  --   --  4.6 3.5* 3.9* 3.8*  NEUTROABS 4.1  --   --   --   --  2.8  --   --   --   HGB 11.3*   < > 11.8*   < > 12.9 12.0 11.8* 12.3 12.4  HCT 39.1   < > 40.7   < > 38.0 40.6 40.2 42.0 41.4  MCV 95.8  --  96.2  --   --  92.9 93.3 92.5 92.4  PLT 171  --  175  --   --  181 162 162 144*   < > = values in this interval not displayed.   Basic Metabolic Panel: Recent Labs  Lab 11/24/19 0344 11/24/19 1608 11/25/19 0449 11/25/19 0449 11/25/19 0521 11/25/19 1408 11/25/19 1736 11/26/19 1017 11/30/19 0348  NA 139   < > 143   < > 142 143 141 143 141  K 4.3   < > 4.3   < > 4.2 4.1 4.0 4.0 3.7  CL 99  --  97*  --   --   --   --  93*  96*  CO2 32  --  39*  --   --   --   --  40* 33*  GLUCOSE 102*  --  109*  --   --   --   --  92 93  BUN 20  --  18  --   --   --   --  15 16  CREATININE 1.30*  --  1.12*  --   --   --   --  0.98 0.95  CALCIUM 8.4*  --  9.2  --   --   --   --  9.5 9.0   < > =  values in this interval not displayed.   Liver Function Tests: Recent Labs  Lab 11/24/19 0344  AST 66*  ALT 56*  ALKPHOS 36*  BILITOT 0.8  PROT 6.4*  ALBUMIN 3.2*   Urine analysis:    Component Value Date/Time   COLORURINE AMBER (A) 11/23/2019 0220   APPEARANCEUR CLOUDY (A) 11/23/2019 0220   LABSPEC 1.027 11/23/2019 0220   PHURINE 5.0 11/23/2019 0220   GLUCOSEU NEGATIVE 11/23/2019 0220   HGBUR NEGATIVE 11/23/2019 0220   BILIRUBINUR NEGATIVE 11/23/2019 0220   KETONESUR NEGATIVE 11/23/2019 0220   PROTEINUR 100 (A) 11/23/2019 0220   NITRITE NEGATIVE 11/23/2019 0220   LEUKOCYTESUR NEGATIVE 11/23/2019 0220   Recent Results (from the past 240 hour(s))  SARS Coronavirus 2 by RT PCR (hospital order, performed in [facility] hospital lab) Nasopharyngeal Nasopharyngeal Swab     Status: None   Collection Time: 11/22/19 10:31 AM   Specimen: Nasopharyngeal Swab  Result Value Ref Range Status   SARS Coronavirus 2 NEGATIVE NEGATIVE Final    Comment: (NOTE) SARS-CoV-2 target nucleic acids are NOT DETECTED. The SARS-CoV-2 RNA is generally detectable in upper and lower respiratory specimens during the acute phase of infection. The lowest concentration of SARS-CoV-2 viral copies this assay can detect is 250 copies / mL. A negative result does not preclude SARS-CoV-2 infection and should not be used as the sole basis for treatment or other patient management decisions.  A negative result may occur with improper specimen collection / handling, submission of specimen other than nasopharyngeal swab, presence of viral mutation(s) within the areas targeted by this assay, and inadequate number of viral copies (<250 copies / mL). A  negative result must be combined with clinical observations, patient history, and epidemiological information. Fact Sheet for Patients:   StrictlyIdeas.no Fact Sheet for Healthcare Providers: BankingDealers.co.za This test is not yet approved or cleared  by the [place] FDA and has been authorized for detection and/or diagnosis of SARS-CoV-2 by FDA under an Emergency Use Authorization (EUA).  This EUA will remain in effect (meaning this test can be used) for the duration of the COVID-19 declaration under Section 564(b)(1) of the Act, 21 U.S.C. section 360bbb-3(b)(1), unless the authorization is terminated or revoked sooner. Performed at [facility], [place] [address] [place], [place] [address]   Blood culture (routine x 2)     Status: None   Collection Time: 11/22/19 11:00 AM   Specimen: BLOOD RIGHT HAND  Result Value Ref Range Status   Specimen Description BLOOD RIGHT HAND  Final   Special Requests   Final    BOTTLES DRAWN AEROBIC ONLY Blood Culture results may not be optimal due to an inadequate volume of blood received in culture bottles   Culture   Final    NO GROWTH 5 DAYS Performed at [facility], [place] [address], [place], [place] [address]    Report Status 11/27/2019 FINAL  Final  MRSA PCR Screening     Status: None   Collection Time: 11/22/19  2:14 PM   Specimen: Nasopharyngeal  Result Value Ref Range Status   MRSA by PCR NEGATIVE NEGATIVE Final    Comment:        The GeneXpert MRSA Assay (FDA approved for NASAL specimens only), is one component of a comprehensive MRSA colonization surveillance program. It is not intended to diagnose MRSA infection nor to guide or monitor treatment for MRSA infections. Performed at [facility], [place] [address], [place], [place] [address]  Blood culture (routine x 2)     Status: None   Collection Time: 11/22/19  4:42 PM   Specimen: BLOOD  Result Value Ref Range  Status   Specimen Description BLOOD SITE NOT SPECIFIED  Final   Special Requests   Final    BOTTLES DRAWN AEROBIC AND ANAEROBIC Blood Culture adequate volume   Culture   Final    NO GROWTH 5 DAYS Performed at [facility], [address] [place], [place] [address]    Report Status 11/27/2019 FINAL  Final      Radiology Studies: No results found.  Scheduled Meds: . Chlorhexidine Gluconate Cloth  6 each Topical Daily  . insulin aspart  3-9 Units Subcutaneous TID WC  . megestrol  40 mg Oral BID  . rivaroxaban  15 mg Oral BID WC   Followed by  . [START ON 12/17/2019] rivaroxaban  20 mg Oral Q supper   Continuous Infusions:   LOS: 8 days   Time spent: 25 minutes.  Patrecia Pour, MD [facility] [email or web address] 11/30/2019, 4:51 PM

## 2019-11-30 NOTE — Progress Notes (Signed)
Pt placed on BIPAP for night tine rest. Tolerating well.

## 2019-11-30 NOTE — Progress Notes (Signed)
Inpatient Rehab Admissions:  Inpatient Rehab Consult received.  Chart reviewed and discussed with Dr. Posey Pronto.  Pt with deconditioning, and currently mobilizing with min assist or better (supervision-min guard for gait up to 60').  Pt does not require the intensity of CIR at this time, and with 24/7 support available at home, could continue therapy for endurance and strengthening with a lower level of care.    Signed: Shann Medal, PT, DPT Admissions Coordinator [phone] 11/30/19  9:50 AM

## 2019-12-01 DIAGNOSIS — R4182 Altered mental status, unspecified: Secondary | ICD-10-CM

## 2019-12-01 LAB — CBC
HCT: 41.8 % (ref 36.0–46.0)
Hemoglobin: 12.7 g/dL (ref 12.0–15.0)
MCH: 28 pg (ref 26.0–34.0)
MCHC: 30.4 g/dL (ref 30.0–36.0)
MCV: 92.3 fL (ref 80.0–100.0)
Platelets: 170 10*3/uL (ref 150–400)
RBC: 4.53 MIL/uL (ref 3.87–5.11)
RDW: 14.6 % (ref 11.5–15.5)
WBC: 4.1 10*3/uL (ref 4.0–10.5)
nRBC: 0 % (ref 0.0–0.2)

## 2019-12-01 LAB — GLUCOSE, CAPILLARY
Glucose-Capillary: 70 mg/dL (ref 70–99)
Glucose-Capillary: 69 mg/dL — ABNORMAL LOW (ref 70–99)
Glucose-Capillary: 81 mg/dL (ref 70–99)
Glucose-Capillary: 92 mg/dL (ref 70–99)

## 2019-12-01 MED ORDER — RIVAROXABAN (XARELTO) VTE STARTER PACK (15 & 20 MG)
ORAL_TABLET | ORAL | 0 refills | Status: DC
Start: 2019-12-01 — End: 2020-02-09

## 2019-12-01 MED ORDER — MEGESTROL ACETATE 40 MG PO TABS: 40.0000 mg | ORAL_TABLET | Freq: Two times a day (BID) | ORAL | 0 refills | Status: DC

## 2019-12-01 MED ORDER — RIVAROXABAN 20 MG PO TABS: 20.0000 mg | ORAL_TABLET | Freq: Every day | ORAL | Status: DC

## 2019-12-01 MED FILL — MEGESTROL 40 MG TABLET: 40 | 30 days supply | Qty: 60 | Fill #0

## 2019-12-01 MED FILL — XARELTO STARTER PACK: 15 & 20 | 30 days supply | Qty: 51 | Fill #0

## 2019-12-01 NOTE — Progress Notes (Signed)
Physical Therapy Treatment Patient Details Name: Angel Brewer MRN: [id number] DOB: 1965/12/07 Today's Date: 12/01/2019    History of Present Illness 54 yo female with PMH of DVT and PE on Xarelto (2017) and OSA on home CPAP presented with SOB and AMS. She had right wrist surgery on 5/10 for reconstruction scapholunate ligament and had stopped her Xarelto on 5/6 in preparation for surgery. She was advised to remain off Xarelto for 10 days post-op. She had been experiencing SOB and chest pain the days leading up to her surgery. She was found at home confused and on the floor by her niece.    PT Comments    Patient progressing with ambulation tolerance with increased distance with hallway ambulation.  Demonstrated hypoxia with ambulation on RA needing up to 3LPM for keeping sats >90%.  CIR denied stay and pt not wanting SNF due to required 30 day stay.  Feel she will need follow up HHPT/aide and hopefully son to stay a few days.  PT to follow until d/c.    Follow Up Recommendations  Home health PT;Supervision for mobility/OOB     Equipment Recommendations  Rolling walker with 5" wheels(bariatric)    Recommendations for Other Services       Precautions / Restrictions Precautions Precautions: Fall Precaution Comments: watch O2 Restrictions Other Position/Activity Restrictions: watch R wrist, no specific weight bearing orders, but protected nonetheless    Mobility  Bed Mobility               General bed mobility comments: up in chair  Transfers Overall transfer level: Needs assistance Equipment used: 1 person hand held assist Transfers: Sit to/from Stand Sit to Stand: Mod assist         General transfer comment: pt pulling up with L UE on PT hand and use of momentum from recliner  Ambulation/Gait Ambulation/Gait assistance: Supervision Gait Distance (Feet): 150 Feet Assistive device: Rolling walker (2 wheeled) Gait Pattern/deviations: Step-through pattern;Decreased  stride length;Wide base of support     General Gait Details: use of RW for balance and stopping to check SpO2 with hand off walker at time, patient with mild dyspnea on O2 with ambulation SpO2 down as low as 77% on RA, 81% on 2L O2 87 on 3L O2   Stairs             Wheelchair Mobility    Modified Rankin (Stroke Patients Only)       Balance Overall balance assessment: Needs assistance   Sitting balance-[name]: Good     Standing balance support: Bilateral upper extremity supported Standing balance-[name]: Poor Standing balance comment: UE support needed in standing                            Cognition Arousal/Alertness: Awake/alert Behavior During Therapy: WFL for tasks assessed/performed Overall Cognitive Status: Within Functional Limits for tasks assessed                                        Exercises      General Comments General comments (skin integrity, edema, etc.): son present, working on papers to stay with pt, pt concerned about CPAP setting for night time as on Bipap since here RN informed      Pertinent Vitals/Pain Pain Assessment: No/denies pain    Home Living  Prior Function            PT Goals (current goals can now be found in the care plan section) Progress towards PT goals: Progressing toward goals    Frequency    Min 3X/week      PT Plan Discharge plan needs to be updated    Co-evaluation              AM-PAC PT "6 Clicks" Mobility   Outcome Measure  Help needed turning from your back to your side while in a flat bed without using bedrails?: A [place]ing Help needed moving from lying on your back to sitting on the side of a flat bed without using bedrails?: A [place] Help needed moving to and from a bed to a chair (including a wheelchair)?: A Brannick Help needed standing up from a chair using your arms (e.g., wheelchair or bedside chair)?: A [place] Help needed  to walk in hospital room?: None Help needed climbing 3-5 steps with a railing? : A Lot 6 Click Score: 18    End of Session Equipment Utilized During Treatment: Oxygen Activity Tolerance: Patient tolerated treatment well Patient left: in chair;with call bell/phone within reach;with family/visitor present   PT Visit Diagnosis: Other abnormalities of gait and mobility (R26.89);Muscle weakness (generalized) (M62.81)     Time: 1600-1620 PT Time Calculation (min) (ACUTE ONLY): 20 min  Charges:  $Gait Training: 8-22 mins                     Angel Brewer, [place] [phone] 12/01/2019    Reginia Naas 12/01/2019, 4:20 PM

## 2019-12-01 NOTE — Progress Notes (Addendum)
SATURATION QUALIFICATIONS: (This note is used to comply with regulatory documentation for home oxygen)  Patient Saturations on Room Air at Rest =93  Patient Saturations on Room Air while Ambulating = NA  Patient Saturations on NALiters of oxygen while Ambulating = NA Patient walked , however, Saturation was unable to be obtained never placed on oxygen upon sitting saturation were 87 then returned to 91%

## 2019-12-01 NOTE — Progress Notes (Signed)
SATURATION QUALIFICATIONS: (This note is used to comply with regulatory documentation for home oxygen)  Patient Saturations on Room Air at Rest = 94%  Patient Saturations on Room Air while Ambulating = 77%  Patient Saturations on 3 Liters of oxygen while Ambulating = 91%  Please briefly explain why patient needs home oxygen:  Patient with hypoxia with ambulation on room air.  On 2L still dropped to 81%, 3L able to maintain with SpO2 mostly above 90.    Magda Kiel, [place] [phone] 12/01/2019

## 2019-12-01 NOTE — Plan of Care (Signed)

## 2019-12-01 NOTE — Discharge Summary (Signed)
Physician Discharge Summary  Shamikia Scobee NWG:[id number] DOB: [birth year] DOA: 11/22/2019  PCP: [name], NP  Admit date: 11/22/2019 Discharge date: 12/01/2019  Admitted From: Home Disposition: Home. Patient declined SNF.  Recommendations for Outpatient Follow-up:  1. Follow up with PCP in 1-2 weeks 2. Please obtain BMP/CBC in one week 3. Follow up with pulmonology for chronic hypercarbic respiratory failure 4. Follow up with cardiology for consideration of ischemic evaluation 5. Follow up with OB/GYN. Continue megace until that time. Will need pelvic U/S, endometrial sampling.  Home Health: PT, OT, aide, RN ordered Equipment/Devices: 3 in 1, rolling walker, 2L ambulatory O2 Discharge Condition: Stable CODE STATUS: Full Diet recommendation: As tolerated  Brief/Interim Summary: Angel Brewer is a 54 y.o. female with a history of DVT and PE (2017), morbid obesity (BMI 59), OSA on CPAP who presented 5/11 with dyspnea and confusion the day after  repeat surgery for scapholunate ligament reconstruction. She was admitted to the ICU requiring noninvasive ventilatory support. Lower extremity dopplers revealed acute left leg DVT, and, in addition to OHS/OSA, working diagnosis for acute respiratory decompensation is pulmonary venous thromboembolic disease being treated with reinitiation of anticoagulation which had been held for surgery. Echocardiogram revealed LVEF 55-60% with G1DD, no WMA, elevated RV pressures and moderately reduced systolic function with dilated and blunted IVC. Diuresis was also provided, and with clinical stability, PT evaluation was requested.  The patient reported overall improvement in respiratory effort, oxygen weaned and NIPPV continued per pulmonology recommendations. She reported vaginal bleeding starting 5/18 for which OB/GYN was consulted and recommended megace initiation. Hgb has remained stable and within normal limits. On the day of discharge SpO2 dropped to  88% at the end of ambulation, so this will be provided at DC. Had no hypoxia at rest. PT recommended CIR, and then SNF once they felt she did not require CIR level of care. The patient would not be able to pay for her bills and go to SNF, so she opts to be discharged home with the help of family.   Discharge Diagnoses:  Active Problems:   Acute hypoxemic respiratory failure (HCC)   Altered mental status  Acute hypoxemic and hypercarbic respiratory failure: Due to OHS, OSA, and likely pulmonary venous thromboembolic disease/pulmonary hypertension in the setting of holding anticoagulation.  - Continue supplemental oxygen, did get down to 88% with ambulation on day of discharge. Will provide supplemental O2.  - Continue CPAP qHS and with naps.  - Restarted anticoagulation, converted back to xarelto which will be continued.   Vaginal bleeding: Reported per pt in setting of anticoagulation without pain, discharge, or hx AUB. No urinary symptoms either.  - OB/GYN started megace 44m po BID which will continue until outpatient follow up, ideally with pelvic U/S and possibly endometrial sampling. - Check Hgb at follow up, it has remained normal here.  Acute diastolic CHF: GV7QIon echo, preserved LVEF.  - Control BP - s/p diuresis, does not appear volume overloaded peripherally  Troponin elevation due to myocardial demand ischemia: - Recommend outpatient follow up with cardiology - Continue anticoagulation  S/p scapholunate ligament reconstruction on 5/10:  - Orthopedics aware of admission, has been following, no immediate concerns. Plans to follow up as per routine in the outpatient setting between 10-15 days post operatively.  History of DVT/PE, current acute DVT of the left tibial and peroneal veins:  - Xarelto as above.  Morbid obesity: Estimated body mass index is 59.91 kg/m as calculated from the following:   Height as  of this encounter: _0  (1.727 m).   Weight as of this  encounter: 178.7 kg.  Discharge Instructions  Allergies as of 12/01/2019   No Known Allergies     Medication List    TAKE these medications   acetaminophen 325 MG tablet Commonly known as: Tylenol Take 2 tablets (650 mg total) by mouth every 6 (six) hours.   diclofenac Sodium 1 % Gel Commonly known as: VOLTAREN Apply 2-4 g topically 4 (four) times daily as needed (knee pain).   megestrol 40 MG tablet Commonly known as: MEGACE Take 1 tablet (40 mg total) by mouth 2 (two) times daily.   oxyCODONE 5 MG immediate release tablet Commonly known as: Roxicodone Take 1 tablet (5 mg total) by mouth every 6 (six) hours as needed for severe pain.   PRESCRIPTION MEDICATION CPAP- At bedtime   Rivaroxaban Stater Pack (15 mg and 20 mg) Commonly known as: XARELTO STARTER PACK Follow package directions: Take one 69m tablet by mouth twice a day. On day 22, switch to one 22mtablet once a day. Take with food. What changed: You were already taking a medication with the same name, and this prescription was added. Make sure you understand how and when to take each.   rivaroxaban 20 MG Tabs tablet Commonly known as: XARELTO Take 1 tablet (20 mg total) by mouth daily with supper. after restarting per starter pack What changed: additional instructions            Durable Medical Equipment  (From admission, onward)         Start     Ordered   12/01/19 1125  For home use only DME oxygen  Once    Question Answer Comment  Length of Need 6 Months   Mode or (Route) Nasal cannula   Liters per Minute 2   Frequency Continuous (stationary and portable oxygen unit needed)   Oxygen delivery system Gas      12/01/19 1125   12/01/19 1125  For home use only DME Walker rolling  Once    Question Answer Comment  Walker: With 5 [place] Patient needs a walker to treat with the following condition Gait instability      12/01/19 1125   12/01/19 1125  For home use only DME 3 n 1  Once      12/01/19 1125         Follow-up Information    ThMilly JakobMD. Call.   Specialty: Orthopedic Surgery Why: immediately to schedule a follow-up appointment 10-15 days from surgery on 11-21-19 Keep splint-dressing on, clean and dry until follow up Contact information: [phone]        FlGildardo PoundsNP .   Specialty: Nurse Practitioner Contact information: [address] [phone]        No Known Allergies  Consultations:  PCCM  Orthopedics, Dr. ThGrandville SilosOB/GYN Dr. CoElly ModenaPM&R  Procedures/Studies: DG Wrist 2 Views Right  Result Date: 11/21/2019 CLINICAL DATA:  Right wrist ligament reconstruction EXAM: DG C-ARM 1-60 MIN; RIGHT WRIST - 2 VIEW COMPARISON:  06/20/2019 FINDINGS: Plate and screw fixation device again noted in the distal right radius, unchanged. Wire placement within the carpal region through the scaphoid into the capitate. IMPRESSION: Intraoperative imaging as above. Electronically Signed   By: KeRolm Baptise.D.   On: 11/21/2019 09:04   DG Chest Port 1 View  Result Date: 11/23/2019  CLINICAL DATA:  Respiratory failure. EXAM: PORTABLE CHEST 1 VIEW COMPARISON:  Chest x-ray from yesterday. FINDINGS: Unchanged left internal jugular central venous catheter. Stable cardiomegaly and pulmonary vascular congestion. Persistent low lung volumes with unchanged mild bibasilar and lingular atelectasis. No pleural effusion or pneumothorax. No acute osseous abnormality. IMPRESSION: 1. Stable cardiomegaly and pulmonary vascular congestion. Electronically Signed   By: Titus Dubin M.D.   On: 11/23/2019 06:45   DG CHEST PORT 1 VIEW  Result Date: 11/22/2019 CLINICAL DATA:  Check central line placement EXAM: PORTABLE CHEST 1 VIEW COMPARISON:  11/22/2019 FINDINGS: Cardiac shadow remains enlarged. Left-sided jugular central line is noted with the catheter tip at the confluence of the innominate  veins. No pneumothorax is seen. Mild vascular congestion remains. No focal confluent infiltrate is seen. IMPRESSION: Stable vascular congestion. No pneumothorax following central line placement. Tip is noted at the confluence of the innominate veins. Electronically Signed   By: Inez Catalina M.D.   On: 11/22/2019 16:29   DG Chest Portable 1 View  Result Date: 11/22/2019 CLINICAL DATA:  Hypoxia.  Wrist surgery yesterday EXAM: PORTABLE CHEST 1 VIEW COMPARISON:  Chest CT 06/13/2019 FINDINGS: Cardiomegaly and vascular pedicle widening with central venous congestion. No Kerley lines, effusion, or air leak. IMPRESSION: Cardiomegaly and vascular congestion. Electronically Signed   By: Monte Fantasia M.D.   On: 11/22/2019 10:36   DG C-Arm 1-60 Min  Result Date: 11/21/2019 CLINICAL DATA:  Right wrist ligament reconstruction EXAM: DG C-ARM 1-60 MIN; RIGHT WRIST - 2 VIEW COMPARISON:  06/20/2019 FINDINGS: Plate and screw fixation device again noted in the distal right radius, unchanged. Wire placement within the carpal region through the scaphoid into the capitate. IMPRESSION: Intraoperative imaging as above. Electronically Signed   By: Rolm Baptise M.D.   On: 11/21/2019 09:04   ECHOCARDIOGRAM COMPLETE  Result Date: 11/23/2019    ECHOCARDIOGRAM REPORT   Patient Name:   Angel Brewer Date of Exam: 11/23/2019 Medical Rec #:  [id number]      Height:       68.0 in Accession #:    [id number]     Weight:       404.0 lb Date of Birth:  08/19/1965      BSA:          2.755 m Patient Age:    54 years       BP:           110/80 mmHg Patient Gender: F              HR:           64 bpm. Exam Location:  Inpatient Procedure: 2D Echo Indications:    congestive heart failure 428.0  History:        Patient has prior history of Echocardiogram examinations, most                 recent 06/16/2019. Risk Factors:Sleep Apnea and Hypertension.  Sonographer:    Johny Chess Referring Phys: [id number] Mackay MARSHALL  Sonographer Comments:  Patient is morbidly obese. Image acquisition challenging due to patient body habitus. IMPRESSIONS  1. Left ventricular ejection fraction, by estimation, is 55 to 60%. The left ventricle has normal function. The left ventricle has no regional wall motion abnormalities. Left ventricular diastolic parameters are consistent with Grade I diastolic dysfunction (impaired relaxation).  2. Right ventricular systolic function is moderately reduced. The right ventricular size is moderately enlarged. There is moderately elevated pulmonary artery systolic pressure. The estimated right  ventricular systolic pressure is 15.1 mmHg.  3. Left atrial size was mildly dilated.  4. Right atrial size was mildly dilated.  5. The mitral valve is normal in structure. Mild mitral valve regurgitation. No evidence of mitral stenosis.  6. The aortic valve is normal in structure. Aortic valve regurgitation is not visualized. No aortic stenosis is present.  7. The inferior vena cava is dilated in size with <50% respiratory variability, suggesting right atrial pressure of 15 mmHg. FINDINGS  Left Ventricle: Left ventricular ejection fraction, by estimation, is 55 to 60%. The left ventricle has normal function. The left ventricle has no regional wall motion abnormalities. The left ventricular internal cavity size was normal in size. There is  no left ventricular hypertrophy. Left ventricular diastolic parameters are consistent with Grade I diastolic dysfunction (impaired relaxation). Normal left ventricular filling pressure. Right Ventricle: The right ventricular size is moderately enlarged. No increase in right ventricular wall thickness. Right ventricular systolic function is moderately reduced. There is moderately elevated pulmonary artery systolic pressure. The tricuspid  regurgitant velocity is 3.18 m/s, and with an assumed right atrial pressure of 15 mmHg, the estimated right ventricular systolic pressure is 76.1 mmHg. Left Atrium: Left atrial  size was mildly dilated. Right Atrium: Right atrial size was mildly dilated. Pericardium: Trivial pericardial effusion is present. The pericardial effusion is posterior and lateral to the left ventricle. Mitral Valve: The mitral valve is normal in structure. Normal mobility of the mitral valve leaflets. Mild mitral valve regurgitation. No evidence of mitral valve stenosis. Tricuspid Valve: The tricuspid valve is normal in structure. Tricuspid valve regurgitation is mild . No evidence of tricuspid stenosis. Aortic Valve: The aortic valve is normal in structure. Aortic valve regurgitation is not visualized. No aortic stenosis is present. Pulmonic Valve: The pulmonic valve was normal in structure. Pulmonic valve regurgitation is not visualized. No evidence of pulmonic stenosis. Aorta: The aortic root is normal in size and structure. Venous: The inferior vena cava is dilated in size with less than 50% respiratory variability, suggesting right atrial pressure of 15 mmHg. IAS/Shunts: No atrial level shunt detected by color flow Doppler.  LEFT VENTRICLE PLAX 2D LVIDd:         4.40 cm  Diastology LVIDs:         3.30 cm  LV e' lateral:   10.90 cm/s LV PW:         1.10 cm  LV E/e' lateral: 6.8 LV IVS:        1.10 cm  LV e' medial:    8.49 cm/s LVOT diam:     2.20 cm  LV E/e' medial:  8.7 LV SV:         101 LV SV Index:   37 LVOT Area:     3.80 cm  RIGHT VENTRICLE RV S prime:     12.90 cm/s TAPSE (M-mode): 2.9 cm LEFT ATRIUM             Index       RIGHT ATRIUM           Index LA diam:        4.30 cm 1.56 cm/m  RA Area:     23.30 cm LA Vol (A2C):   72.6 ml 26.35 ml/m RA Volume:   74.60 ml  27.07 ml/m LA Vol (A4C):   45.5 ml 16.51 ml/m LA Biplane Vol: 58.1 ml 21.09 ml/m  AORTIC VALVE LVOT Vmax:   126.00 cm/s LVOT Vmean:  84.400 cm/s LVOT VTI:  0.266 m  AORTA Ao Root diam: 3.20 cm MITRAL VALVE               TRICUSPID VALVE MV Area (PHT): 3.60 cm    TR Peak grad:   40.4 mmHg MV Decel Time: 211 msec    TR Vmax:         318.00 cm/s MV E velocity: 74.10 cm/s MV A velocity: 92.10 cm/s  SHUNTS MV E/A ratio:  0.80        Systemic VTI:  0.27 m                            Systemic Diam: 2.20 cm Ena Dawley MD Electronically signed by Ena Dawley MD Signature Date/Time: 11/23/2019/3:11:13 PM    Final    VAS [place] LOWER EXTREMITY VENOUS (DVT)  Result Date: 11/23/2019  Lower Venous DVTStudy Indications: Edema.  Risk Factors: Obesity (183.3kg). Limitations: Body habitus, poor ultrasound/tissue interface and poor patient cooperation, movement, unable to tolerate most compression maneuvers. Comparison Study: 07/17/2019- right lower extremity venous duplex: acute DVT                   involving right popliteal vein, right gastrocnemius veins. Performing Technologist: Maudry Mayhew MHA, RDMS, RVT, RDCS  Examination Guidelines: A complete evaluation includes B-mode imaging, spectral Doppler, color Doppler, and power Doppler as needed of all accessible portions of each vessel. Bilateral testing is considered an integral part of a complete examination. Limited examinations for reoccurring indications may be performed as noted. The reflux portion of the exam is performed with the patient in reverse Trendelenburg.  +-------+---------------+---------+-----------+----------+--------------+ RIGHT  CompressibilityPhasicitySpontaneityPropertiesThrombus Aging +-------+---------------+---------+-----------+----------+--------------+ CFV    Full           Yes      Yes                                 +-------+---------------+---------+-----------+----------+--------------+ SFJ    Full                                                        +-------+---------------+---------+-----------+----------+--------------+ FV Prox                        Yes                                 +-------+---------------+---------+-----------+----------+--------------+ FV Mid                         Yes                                  +-------+---------------+---------+-----------+----------+--------------+ POP                   Yes      Yes                                 +-------+---------------+---------+-----------+----------+--------------+ PTV  Yes                                 +-------+---------------+---------+-----------+----------+--------------+   Right Technical Findings: Not visualized segments include PFV, distal FV, peroneal veins.  +-------+---------------+---------+-----------+----------+--------------+ LEFT   CompressibilityPhasicitySpontaneityPropertiesThrombus Aging +-------+---------------+---------+-----------+----------+--------------+ FV Prox                        Yes                                 +-------+---------------+---------+-----------+----------+--------------+ FV Mid                         Yes                                 +-------+---------------+---------+-----------+----------+--------------+ POP    Full           Yes      Yes                                 +-------+---------------+---------+-----------+----------+--------------+ PTV    None                    No                   Acute          +-------+---------------+---------+-----------+----------+--------------+ PERO   None                    No                   Acute          +-------+---------------+---------+-----------+----------+--------------+   Left Technical Findings: Not visualized segments include CFV, SFJ, PFV, distal FV,.   Summary: RIGHT: - There is no evidence of acute deep vein thrombosis in the lower extremity. However, portions of this examination were limited- see technologist comments above.  - No cystic structure found in the popliteal fossa.  LEFT: - Findings consistent with acute deep vein thrombosis involving the left posterior tibial veins, and left peroneal veins. - No cystic structure found in the popliteal fossa.  *See table(s) above for  measurements and observations. Electronically signed by Curt Jews MD on 11/23/2019 at 7:33:27 PM.    Final    Subjective: No chest pain reported, no dyspnea at rest, eating ok but dislikes food here. Getting up to bathroom without oxygen. Still having bleeding, about stable from prior. No dysuria, flank pain, clots, abd pain, swelling.  Discharge Exam: Vitals:   12/01/19 0751 12/01/19 1124  BP: (!) 132/98 118/80  Pulse: (!) 54 (!) 58  Resp: 19 17  Temp: 98.4 F (36.9 C) 97.9 F (36.6 C)  SpO2: 98% 99%   General: Pt is alert, awake, not in acute distress Cardiovascular: RRR, S1/S2 +, no rubs, no gallops Respiratory: CTA bilaterally, no wheezing, no rhonchi Abdominal: Soft, NT, ND, bowel sounds + Extremities: No visible edema, no cyanosis  Labs: BNP (last 3 results) Recent Labs    06/13/19 1200 11/22/19 1122  BNP 24.1 616.0*   Basic Metabolic Panel: Recent Labs  Lab 11/25/19 0449 11/25/19 0449 11/25/19 0521 11/25/19 1408 11/25/19 1736 11/26/19 1017  11/30/19 0348  NA 143   < > 142 143 141 143 141  K 4.3   < > 4.2 4.1 4.0 4.0 3.7  CL 97*  --   --   --   --  93* 96*  CO2 39*  --   --   --   --  40* 33*  GLUCOSE 109*  --   --   --   --  92 93  BUN 18  --   --   --   --  15 16  CREATININE 1.12*  --   --   --   --  0.98 0.95  CALCIUM 9.2  --   --   --   --  9.5 9.0   < > = values in this interval not displayed.   Liver Function Tests: No results for input(s): AST, ALT, ALKPHOS, BILITOT, PROT, ALBUMIN in the last 168 hours. No results for input(s): LIPASE, AMYLASE in the last 168 hours. No results for input(s): AMMONIA in the last 168 hours. CBC: Recent Labs  Lab 11/26/19 1017 11/27/19 0519 11/29/19 1149 11/30/19 0348 12/01/19 0501  WBC 4.6 3.5* 3.9* 3.8* 4.1  NEUTROABS 2.8  --   --   --   --   HGB 12.0 11.8* 12.3 12.4 12.7  HCT 40.6 40.2 42.0 41.4 41.8  MCV 92.9 93.3 92.5 92.4 92.3  PLT 181 162 162 144* 170   Cardiac Enzymes: No results for input(s):  CKTOTAL, CKMB, CKMBINDEX, TROPONINI in the last 168 hours. BNP: Invalid input(s): POCBNP CBG: Recent Labs  Lab 11/30/19 1710 11/30/19 1848 11/30/19 2236 12/01/19 0748 12/01/19 1126  GLUCAP 64* 106* 82 70 69*   D-Dimer No results for input(s): DDIMER in the last 72 hours. Hgb A1c No results for input(s): HGBA1C in the last 72 hours. Lipid Profile No results for input(s): CHOL, HDL, LDLCALC, TRIG, CHOLHDL, LDLDIRECT in the last 72 hours. Thyroid function studies No results for input(s): TSH, T4TOTAL, T3FREE, THYROIDAB in the last 72 hours.  Invalid input(s): FREET3 Anemia work up No results for input(s): VITAMINB12, FOLATE, FERRITIN, TIBC, IRON, RETICCTPCT in the last 72 hours. Urinalysis    Component Value Date/Time   COLORURINE AMBER (A) 11/23/2019 0220   APPEARANCEUR CLOUDY (A) 11/23/2019 0220   LABSPEC 1.027 11/23/2019 0220   PHURINE 5.0 11/23/2019 0220   GLUCOSEU NEGATIVE 11/23/2019 0220   HGBUR NEGATIVE 11/23/2019 0220   BILIRUBINUR NEGATIVE 11/23/2019 0220   KETONESUR NEGATIVE 11/23/2019 0220   PROTEINUR 100 (A) 11/23/2019 0220   NITRITE NEGATIVE 11/23/2019 0220   [place] 11/23/2019 0220    Microbiology Recent Results (from the past 240 hour(s))  SARS Coronavirus 2 by RT PCR (hospital order, performed in [facility] hospital lab) Nasopharyngeal Nasopharyngeal Swab     Status: None   Collection Time: 11/22/19 10:31 AM   Specimen: Nasopharyngeal Swab  Result Value Ref Range Status   SARS Coronavirus 2 NEGATIVE NEGATIVE Final    Comment: (NOTE) SARS-CoV-2 target nucleic acids are NOT DETECTED. The SARS-CoV-2 RNA is generally detectable in upper and lower respiratory specimens during the acute phase of infection. The lowest concentration of SARS-CoV-2 viral copies this assay can detect is 250 copies / mL. A negative result does not preclude SARS-CoV-2 infection and should not be used as the sole basis for treatment or other patient management  decisions.  A negative result may occur with improper specimen collection / handling, submission of specimen other than nasopharyngeal swab, presence of viral mutation(s)  within the areas targeted by this assay, and inadequate number of viral copies (<250 copies / mL). A negative result must be combined with clinical observations, patient history, and epidemiological information. Fact Sheet for Patients:   StrictlyIdeas.no Fact Sheet for Healthcare Providers: BankingDealers.co.za This test is not yet approved or cleared  by the [place] FDA and has been authorized for detection and/or diagnosis of SARS-CoV-2 by FDA under an Emergency Use Authorization (EUA).  This EUA will remain in effect (meaning this test can be used) for the duration of the COVID-19 declaration under Section 564(b)(1) of the Act, 21 U.S.C. section 360bbb-3(b)(1), unless the authorization is terminated or revoked sooner. Performed at [facility], [place] [address], [place], [place] [address]   Blood culture (routine x 2)     Status: None   Collection Time: 11/22/19 11:00 AM   Specimen: BLOOD RIGHT HAND  Result Value Ref Range Status   Specimen Description BLOOD RIGHT HAND  Final   Special Requests   Final    BOTTLES DRAWN AEROBIC ONLY Blood Culture results may not be optimal due to an inadequate volume of blood received in culture bottles   Culture   Final    NO GROWTH 5 DAYS Performed at [facility], [place] [address] [place], [place] [address]    Report Status 11/27/2019 FINAL  Final  MRSA PCR Screening     Status: None   Collection Time: 11/22/19  2:14 PM   Specimen: Nasopharyngeal  Result Value Ref Range Status   MRSA by PCR NEGATIVE NEGATIVE Final    Comment:        The GeneXpert MRSA Assay (FDA approved for NASAL specimens only), is one component of a comprehensive MRSA colonization surveillance program. It is not intended to diagnose  MRSA infection nor to guide or monitor treatment for MRSA infections. Performed at [facility], [place] [address] [place], [place] [address]   Blood culture (routine x 2)     Status: None   Collection Time: 11/22/19  4:42 PM   Specimen: BLOOD  Result Value Ref Range Status   Specimen Description BLOOD SITE NOT SPECIFIED  Final   Special Requests   Final    BOTTLES DRAWN AEROBIC AND ANAEROBIC Blood Culture adequate volume   Culture   Final    NO GROWTH 5 DAYS Performed at [facility], [address], [place], [place] [address]    Report Status 11/27/2019 FINAL  Final    Time coordinating discharge: Approximately 40 minutes  Patrecia Pour, MD  [facility] 12/01/2019, 1:47 PM

## 2019-12-01 NOTE — Progress Notes (Addendum)
Occupational Therapy Treatment Note  Pt educated on use of AE for ADL tasks. Pt verbalized understanding. Son present for education. O2 probe placed on R ear. Pt ambulated @ 104 ft with lowest SpO2 91 with 2/4 DOE. SpO2 quickly rebounds to mid 90s with rest. Once seated, SpO2 97 while seated. Recommend family monitor SpO2 and increase activity as tolerated. Recommend HHOT to facilitate return to PLOF.     12/01/19 1700  OT Visit Information  Last OT Received On 12/01/19  Assistance Needed +1  History of Present Illness 54 yo female with PMH of DVT and PE on Xarelto (2017) and OSA on home CPAP presented with SOB and AMS. She had right wrist surgery on 5/10 for reconstruction scapholunate ligament and had stopped her Xarelto on 5/6 in preparation for surgery. She was advised to remain off Xarelto for 10 days post-op. She had been experiencing SOB and chest pain the days leading up to her surgery. She was found at home confused and on the floor by her niece.  Precautions  Precautions Fall  Precaution Comments watch O2  Pain Assessment  Pain Assessment No/denies pain  Cognition  Arousal/Alertness Awake/alert  Behavior During Therapy WFL for tasks assessed/performed  Overall Cognitive Status Within Functional Limits for tasks assessed  ADL  General ADL Comments Issued reacher, long handled sponge adn toilet aid. Pt has a reacher but it is broken. Educated on use of AE. Pt verbalzied understanding.  Recommend pt use shower chair. Son states he can switch out shower head to [facility] shower head.   Bed Mobility  General bed mobility comments up in chair  Balance  Standing balance-[name] Fair  Transfers  Equipment used 1 person hand held assist  Transfers Sit to/from Stand  Sit to Stand Min assist  General Comments  General comments (skin integrity, edema, etc.) son present for education. Pt ambulated @ 80 ft on RA with lowest SpO2 91 using probe on ear with good waveform. 2/4 DOE.   Other  Exercises  Other Exercises REviewed energy oncservation strategies; encouraged use of incentive spirometer  OT - End of Session  Equipment Utilized During Treatment Rolling walker  Activity Tolerance Patient tolerated treatment well  Patient left in chair;with call bell/phone within reach;with family/visitor present  Nurse Communication Mobility status;Other (comment) (O2 needs)  OT Assessment/Plan  OT Plan Discharge plan needs to be updated  OT Visit Diagnosis Unsteadiness on feet (R26.81);Muscle weakness (generalized) (M62.81)  OT Frequency (ACUTE ONLY) Min 2X/week  Follow Up Recommendations Home health OT;Supervision - Intermittent  OT Equipment Tub/shower seat (bariatric )  AM-PAC OT "6 Clicks" Daily Activity Outcome Measure (Version 2)  Help from another person eating meals? 4  Help from another person taking care of personal grooming? 3  Help from another person toileting, which includes using toliet, bedpan, or urinal? 3  Help from another person bathing (including washing, rinsing, drying)? 3  Help from another person to put on and taking off regular upper body clothing? 3  Help from another person to put on and taking off regular lower body clothing? 3  6 Click Score 19  OT Goal Progression  Progress towards OT goals Progressing toward goals  Acute Rehab OT Goals  Patient Stated Goal to be able to manage at home by myself  OT Goal Formulation With patient  Time For Goal Achievement 12/14/19  Potential to Achieve Goals Good  ADL Goals  Pt Will Perform Grooming with modified independence;standing  Pt Will Perform Upper Body Dressing  with modified independence;sitting  Pt Will Perform Lower Body Dressing with modified independence;sit to/from stand  Pt Will Transfer to Toilet with modified independence;ambulating;regular height toilet  Additional ADL Goal #1 pt will complete bed mobility MOD I with hob elevated as precursor to adls.  OT Time Calculation  OT Start Time  (ACUTE ONLY) 1640  OT Stop Time (ACUTE ONLY) 1707  OT Time Calculation (min) 27 min  OT General Charges  $OT Visit 1 Visit  OT Treatments  $Self Care/Home Management  23-37 mins  Maurie Boettcher, OT/L   Acute OT Clinical Specialist [place] Pager [phone] Office [phone]

## 2019-12-01 NOTE — TOC Initial Note (Signed)
Transition of Care (TOC) - Initial/Assessment Note  [name] RN,BSN Transitions of Care Unit 4NP (non trauma) - RN Case Manager [phone]   Patient Details  Name: Angel Brewer MRN: [id number] Date of Birth: September 14, 1965  Transition of Care [facility]) CM/SW Contact:    [name], RN Phone Number: 12/01/2019, 4:43 PM  Clinical Narrative:                 Pt has d/c order in for today, spoke with pt at bedside regarding transition plan SNF vs Home with [facility]- explained to pt that to go to SNF under her Medicaid she would be required to stay 30 days and give up her check- pt states she does not need to go to rehab for 30 days and she would rather go home if that is the case. Discussed with pt DME needs- pt will need RW and 3n1 for home. Explained to pt [facility] services would be limited under her Medicaid and would be difficult pending finding an agency to accept- pt voiced understanding but would like to try to see if she could get [facility] services. Pt awaiting niece to come to see how much assistance niece may be able to provide. Son is here from [place] but only staying a short while longer. Pt may need home 02- bedside RN to do qualifying note to check.  Pt's wrist surgery was done under worker's comp. - call made to [facility] with Workers Comp to see if they could assist with any discharge needs under w/c comp awaiting return call.   Update- 1300- received return call from [place]- per conversation she states that they never received clinicals on admission- pt was originally in ICU. Per [name] WC has not determined if this is a related admission due to lack of clinicals and therefor can not say if they would assist with d/c needs. - Clinicals faxed to [facility] per request for review to fax #[phone]- will await review to see if this admission is covered under Workers Comp- and if so then pt may be able to be approved for rehab under WC or [place] services.- will await return call.   1500- call  made to [facility] with [employer] for potential DME needs RW, 3n1 and possible home 02- per bedside RN note pt does not qualify- PT/OT to work with pt and redo qualifying note to see if pt meets guidelines. TOC pharmacy to fill meds.   82- spoke with [name] from Workers' Comp- they are still reviewing case and have escalated it up to upper level for review. They will not be able to make a determination today if stay is related to [facility] case- and request pt stay overnight. Spoke with MD who is agreeable as well as Pt via TC- pt is also agreeable to stay overnight so that [facility] can complete review. Will plan for discharge tomorrow - if case is covered under WC then will set up either rehab stay vs home with [facility] under [place] approval.    Expected Discharge Plan: [place] Barriers to Discharge: No Barriers Identified   Patient Goals and CMS Choice Patient states their goals for this hospitalization and ongoing recovery are:: wantst to return home CMS Medicare.gov Compare Post Acute Care list provided to:: Patient Choice offered to / list presented to : Patient  Expected Discharge Plan and Services Expected Discharge Plan: [place]   Discharge Planning Services: CM Consult Post Acute Care  Choice: Home Health, Durable Medical Equipment Living arrangements for the past 2 months: Single Family Home Expected Discharge Date: 12/01/19               DME Arranged: 3-N-1, Walker rolling, Oxygen DME Agency: [employer] Date DME Agency Contacted: 12/01/19 Time DME Agency Contacted: 1500 Representative spoke with at DME Agency: [place]: RN, PT, OT          Prior Living Arrangements/Services Living arrangements for the past 2 months: [place] with:: Self Patient language and need for interpreter reviewed:: Yes Do you feel safe going back to the place where you live?: Yes      Need for Family Participation in Patient Care: Yes (Comment) Care  giver support system in place?: Yes (comment)   Criminal Activity/Legal Involvement Pertinent to Current Situation/Hospitalization: No - Comment as needed  Activities of Daily Living      Permission Sought/Granted Permission sought to share information with : Chartered certified accountant granted to share information with : Yes, Verbal Permission Granted     Permission granted to share info w AGENCY: HH, DME        Emotional Assessment Appearance:: Appears stated age Attitude/Demeanor/Rapport: Engaged Affect (typically observed): Appropriate Orientation: : Oriented to Self, Oriented to Place, Oriented to  Time, Oriented to Situation Alcohol / Substance Use: Not Applicable Psych Involvement: No (comment)  Admission diagnosis:  Acute respiratory failure with hypoxia ([place]) [J96.01] AKI (acute kidney injury) ([place]) [N17.9] Altered mental status, unspecified altered mental status type [R41.82] Acute hypoxemic respiratory failure ([place]) [J96.01] Patient Active Problem List   Diagnosis Date Noted  . Acute hypoxemic respiratory failure ([place]) 11/22/2019  . Altered mental status   . Right wrist fracture 06/14/2019  . Chronic respiratory failure with hypoxia ([place]) 06/13/2019  . Essential hypertension 08/25/2017  . OSA on CPAP 02/03/2017  . Morbid obesity ([place]) 10/29/2016  . Vitamin D deficiency 10/29/2016   PCP:  [name], NP Pharmacy:   [id number] [place] [address]Phone: [phone]: [phone] [place] [place]Phone: [phone]: [phone]    Social Determinants of Health (SDOH) Interventions    Readmission Risk Interventions No flowsheet data found.

## 2019-12-02 LAB — GLUCOSE, CAPILLARY: Glucose-Capillary: 63 mg/dL — ABNORMAL LOW (ref 70–99)

## 2019-12-02 NOTE — Progress Notes (Signed)
Patient interviewed and examined this morning. Was discharged yesterday with plans to go home with maximal HH support after DC to SNF was not possible. SNF continues to be a preferred option for the patient and may be covered under worker's compensation. This decision was sent for consideration yesterday and remains pending. Will follow up on this today and discharge to the appropriate venue. She reports she's getting up a bit easier with less exertional dyspnea today. Lungs are clear, heart is regular, no edema, and abdomen is nondistended, nontender. She remains stable for discharge today.  Vance Gather, MD 12/02/2019 10:25 AM

## 2019-12-02 NOTE — Progress Notes (Signed)
PIV removed from lfa. Pt tolerated well. D/C instructions given to pt. Pt had no questions. Pt and DE taken to vehicle. Pt transported via wc. Katherina Right RN

## 2019-12-02 NOTE — Progress Notes (Signed)
Physical Therapy Treatment Patient Details Name: Angel Brewer MRN: [id number] DOB: 03-Nov-1965 Today's Date: 12/02/2019    History of Present Illness 54 yo female with PMH of DVT and PE on Xarelto (2017) and OSA on home CPAP presented with SOB and AMS. She had right wrist surgery on 5/10 for reconstruction scapholunate ligament and had stopped her Xarelto on 5/6 in preparation for surgery. She was advised to remain off Xarelto for 10 days post-op. She had been experiencing SOB and chest pain the days leading up to her surgery. She was found at home confused and on the floor by her niece.    PT Comments    Patient progressing this session able to stand from a chair on her own after couple of attempts.  She also was able to walk with cane with minguard to S assist.  And no drop on SpO2 below 92% while on RA with O2 sat probe on her ear.  Feel she is appropriate for home with family support and HHPT.  Has a cane at home so still recommending walker for community  Mobility.    Follow Up Recommendations  Home health PT;Supervision for mobility/OOB     Equipment Recommendations  Rolling walker with 5" wheels(bariatric)    Recommendations for Other Services       Precautions / Restrictions Precautions Precautions: Fall Restrictions Other Position/Activity Restrictions: watch R wrist, no specific weight bearing orders, but protected nonetheless    Mobility  Bed Mobility               General bed mobility comments: up in chair  Transfers Overall transfer level: Needs assistance   Transfers: Sit to/from Stand Sit to Stand: Supervision         General transfer comment: increased time and momentum with two attempts for success, but no physical help needed from recliner  Ambulation/Gait Ambulation/Gait assistance: Min assist;Min guard Gait Distance (Feet): 150 Feet(& 80' w/ SPC) Assistive device: 1 person hand held assist Gait Pattern/deviations: Step-through  pattern;Decreased stride length;Wide base of support     General Gait Details: initially pt holding PT's arm on L UE first walk x 150', then with SPC on L UE x about 80-100' slower pace, min cues initially for pattern   Stairs             Wheelchair Mobility    Modified Rankin (Stroke Patients Only)       Balance Overall balance assessment: Needs assistance   Sitting balance-[name]: Good     Standing balance support: Single extremity supported Standing balance-[name]: Fair Standing balance comment: standing no UE support initially                            Cognition Arousal/Alertness: Awake/alert Behavior During Therapy: WFL for tasks assessed/performed Overall Cognitive Status: Within Functional Limits for tasks assessed                                        Exercises      General Comments General comments (skin integrity, edema, etc.): SpO2 on RA x 150' with probe on ear lowest reading 92%      Pertinent Vitals/Pain Pain Assessment: No/denies pain    Home Living                      Prior Function  PT Goals (current goals can now be found in the care plan section) Progress towards PT goals: Progressing toward goals    Frequency    Min 3X/week      PT Plan Current plan remains appropriate    Co-evaluation              AM-PAC PT "6 Clicks" Mobility   Outcome Measure  Help needed turning from your back to your side while in a flat bed without using bedrails?: A [place] Help needed moving from lying on your back to sitting on the side of a flat bed without using bedrails?: A [place] Help needed moving to and from a bed to a chair (including a wheelchair)?: None Help needed standing up from a chair using your arms (e.g., wheelchair or bedside chair)?: None Help needed to walk in hospital room?: A [place] Help needed climbing 3-5 steps with a railing? : A [place] 6 Click Score:  20    End of Session   Activity Tolerance: Patient tolerated treatment well Patient left: in chair;with call bell/phone within reach   PT Visit Diagnosis: Other abnormalities of gait and mobility (R26.89);Muscle weakness (generalized) (M62.81)     Time: 1164-3539 PT Time Calculation (min) (ACUTE ONLY): 27 min  Charges:  $Gait Training: 23-37 mins                     [place] [phone] 12/02/2019    Reginia Naas 12/02/2019, 11:34 AM

## 2019-12-02 NOTE — TOC Transition Note (Addendum)
Transition of Care (TOC) - CM/SW Discharge Note [name] RN,BSN Transitions of Care Unit 4NP (non trauma) - RN Case Manager [phone]   Patient Details  Name: Angel Brewer MRN: [id number] Date of Birth: 1966-02-21  Transition of Care [facility]) CM/SW Contact:  [name], RN Phone Number: 12/02/2019, 1:47 PM   Clinical Narrative:    Pt stable for transition home, received call from [name] at 1300- she still has not received word from Adjustor regarding case. Pt states she is feeling better about her mobility and is ready to go home. Discussed HH options with [name] and until she hears from the Adjustor she is unable to make any [place] arrangements- pt is set to see hand surgeon in f/u next Tuesday as which time if he still feels pt needs HH- then [name] states she can make those arrangements under WC. Spoke with pt at the bedside to discuss the [facility]- pt states she does not really feel she needs them now- and is agreeable to see how she does from now until she has her f/u appointment and then if she needs HH at that time [name] with [facility] can make the arrangements.  DME- RW and 3n1 has been delivered to the room for pt to take home (pt ended up not needing home 02 and did not qualify under Medicaid with OT note done last evening) and meds have been delivered to pt at bedside from [place]. Family to transport home.  Pt will d/c home, and f/u with WC regarding case.    Final next level of care: [place] Barriers to Discharge: No Barriers Identified   Patient Goals and CMS Choice Patient states their goals for this hospitalization and ongoing recovery are:: wantst to return home CMS Medicare.gov Compare Post Acute Care list provided to:: Patient Choice offered to / list presented to : Patient  Discharge Placement                       Discharge Plan and Services   Discharge Planning Services: CM Consult Post Acute Care Choice: Home  Health, Durable Medical Equipment          DME Arranged: 3-N-1, Walker rolling, Oxygen DME Agency: [employer] Date DME Agency Contacted: 12/01/19 Time DME Agency Contacted: 1500 Representative spoke with at DME Agency: [place]: Refused HH(per discussion with WC and pt - pt will wait until f/u on May 25 and see if [facility] still needed then [facility] will set up) [place]: NA        Social Determinants of Health ([place]) Interventions     Readmission Risk Interventions Readmission Risk Prevention Plan 12/02/2019  Post Dischage Appt Complete  Medication Screening Complete  Transportation Screening Complete  Some recent data might be hidden

## 2019-12-02 NOTE — Progress Notes (Signed)
SATURATION QUALIFICATIONS: (This note is used to comply with regulatory documentation for home oxygen)  Patient Saturations on Room Air at Rest = 94%  Patient Saturations on Room Air while Ambulating = 92%  Patient Saturations on NA Liters of oxygen while Ambulating = NA%  Please briefly explain why patient needs home oxygen:  Probe moved to ear and sats staying above 90% with ambulation on room air.  No longer needs home O2.  Magda Kiel, [place] [phone] 12/02/2019

## 2019-12-03 LAB — GLUCOSE, CAPILLARY: Glucose-Capillary: 59 mg/dL — ABNORMAL LOW (ref 70–99)

## 2019-12-05 ENCOUNTER — Encounter: Payer: Self-pay | Admitting: *Deleted

## 2019-12-05 ENCOUNTER — Telehealth: Payer: Self-pay

## 2019-12-05 NOTE — Telephone Encounter (Signed)
Transition Care Management Follow-up Telephone Call  Date of discharge and from where: 12/02/2019, [facility]   How have you been since you were released from the hospital? She said she is doing all right.  She would like to speak to her PCP regarding the referral to heme/onc prior to this hospitalization.    Any questions or concerns?  noted above.   Items Reviewed:  Did the pt receive and understand the discharge instructions provided? yes  Medications obtained and verified?  yes, she said she has her medications including the xarelto and did not have any questions. She has been taking taking the xarelto but is not taking the megace because the vaginal bleeding has stopped.   Any new allergies since your discharge?  none reported   Do you have support at home? yes.  Her son is staying with her for her this week.  She also has a niece who lives in the area  Other (ie: DME, [place], etc) she said that the orthopedic surgeon will arrange for outpatient therapy, if needed, after she sees him tomorrow   Has 3:1 commode   She stated she still has a cast on her right wrist.   Functional Questionnaire: (I = Independent and D = Dependent) ADL's: independent. Has family with her to assist if needed. She has a RW but stated that she is not using it    Follow up appointments reviewed:    PCP Hospital f/u appt confirmed? Ms Raul Del, NP - 12/13/2019 @ [facility] f/u appt confirmed? orthopedic surgeon - 12/06/2019.   Are transportation arrangements needed? not at this time,  Her niece is taking her to her appt tomorrow.  Explained to her that she can call DSS to register for medicaid transportation  If their condition worsens, is the pt aware to call  their PCP or go to the ED? yes  Was the patient provided with contact information for the PCP's office or ED? she has the clinic phone number  Was the pt encouraged to call back with questions or concerns?  yes

## 2019-12-06 ENCOUNTER — Telehealth: Payer: Self-pay | Admitting: Family Medicine

## 2019-12-06 NOTE — Telephone Encounter (Signed)
Attempted to contact patient to get her scheduled for an appointment for evaluation of postmenopausal vaginal bleeding. No answer, left voicemail for patient to give the office a call back to be scheduled.

## 2019-12-13 ENCOUNTER — Other Ambulatory Visit: Payer: Self-pay

## 2019-12-13 ENCOUNTER — Ambulatory Visit: Payer: Medicaid Other | Attending: Nurse Practitioner | Admitting: Nurse Practitioner

## 2020-02-09 ENCOUNTER — Ambulatory Visit: Payer: Medicaid Other | Attending: Critical Care Medicine | Admitting: Critical Care Medicine

## 2020-02-09 ENCOUNTER — Other Ambulatory Visit: Payer: Self-pay

## 2020-02-09 ENCOUNTER — Encounter: Payer: Self-pay | Admitting: Critical Care Medicine

## 2020-02-09 VITALS — BP 157/82 | HR 64 | Temp 98.0°F | Resp 18 | Ht 68.0 in | Wt >= 6400 oz

## 2020-02-09 DIAGNOSIS — R030 Elevated blood-pressure reading, without diagnosis of hypertension: Secondary | ICD-10-CM | POA: Diagnosis not present

## 2020-02-09 DIAGNOSIS — Z1159 Encounter for screening for other viral diseases: Secondary | ICD-10-CM

## 2020-02-09 DIAGNOSIS — Z7901 Long term (current) use of anticoagulants: Secondary | ICD-10-CM

## 2020-02-09 DIAGNOSIS — I82512 Chronic embolism and thrombosis of left femoral vein: Secondary | ICD-10-CM | POA: Diagnosis not present

## 2020-02-09 DIAGNOSIS — I10 Essential (primary) hypertension: Secondary | ICD-10-CM | POA: Diagnosis not present

## 2020-02-09 DIAGNOSIS — Z9989 Dependence on other enabling machines and devices: Secondary | ICD-10-CM | POA: Diagnosis not present

## 2020-02-09 DIAGNOSIS — J9611 Chronic respiratory failure with hypoxia: Secondary | ICD-10-CM

## 2020-02-09 DIAGNOSIS — G4733 Obstructive sleep apnea (adult) (pediatric): Secondary | ICD-10-CM | POA: Diagnosis not present

## 2020-02-09 MED ORDER — APIXABAN 5 MG PO TABS: 5.0000 mg | ORAL_TABLET | Freq: Two times a day (BID) | ORAL | 1 refills | Status: DC

## 2020-02-09 MED ORDER — BLOOD PRESSURE MONITOR DEVI: 0 refills | Status: DC

## 2020-02-09 NOTE — Assessment & Plan Note (Signed)
At the very least this patient will need anticoagulant therapy for life

## 2020-02-09 NOTE — Assessment & Plan Note (Signed)
I recommend this patient continue her CPAP therapy with oxygen therapy nightly

## 2020-02-09 NOTE — Progress Notes (Signed)
Subjective:    Patient ID: Angel Brewer Dates, female    DOB: 03-23-1966, 54 y.o.   MRN: [id number]  This is a morbidly obese 54 year old female primary care patient of [name] who comes in for post hospital visit. Patient was in the hospital between the 11th and 20 May at which time she developed hypoxic respiratory failure and acute left vein thrombosis. She has severe sleep apnea. During the hospitalization was felt she had recurrent pulmonary emboli and chronic hypoxic and hypercarbic respiratory failure. The patient was sent home with oxygen 2 L and the use of the CPAP at night. The patient was also sent home back on Xarelto 20 mg daily.  Note the patient still has left leg pain. She denies any bleeding anywhere else. She has had documented hypertension in the past unclear whether the reading was accurate. She had continued swelling in the feet. She works at [employer] and is on her feet all day at this time.  She has seen hematology in the past but did not feel she had a thorough evaluation at that visit. This was prior to her wrist surgery that occurred prior to being admitted to the hospital.  Below is a copy of the discharge summary   Admit date: 11/22/2019 Discharge date: 12/01/2019  Admitted From: Home Disposition: Home. Patient declined SNF.  Recommendations for Outpatient Follow-up:  1. Follow up with PCP in 1-2 weeks 2. Please obtain BMP/CBC in one week 3. Follow up with pulmonology for chronic hypercarbic respiratory failure 4. Follow up with cardiology for consideration of ischemic evaluation 5. Follow up with OB/GYN. Continue megace until that time. Will need pelvic U/S, endometrial sampling.  Home Health: PT, OT, aide, RN ordered Equipment/Devices: 3 in 1, rolling walker, 2L ambulatory O2 Discharge Condition: Stable CODE STATUS: Full Diet recommendation: As tolerated  Brief/Interim Summary: Angel Littleis a 54 y.o.femalewith a history of DVT and PE  (2017), morbid obesity (BMI 59), OSA on CPAP who presented 5/11 with dyspnea and confusion the day after repeat surgery for scapholunate ligament reconstruction. She was admitted to the ICU requiring noninvasive ventilatory support. Lower extremity dopplers revealed acute left leg DVT, and, in addition to OHS/OSA, working diagnosis for acute respiratory decompensation is pulmonary venous thromboembolic disease being treated with reinitiation of anticoagulation which had been held for surgery. Echocardiogram revealed LVEF 55-60% with G1DD, no WMA, elevated RV pressures and moderately reduced systolic function with dilated and blunted IVC. Diuresis was also provided, and with clinical stability, PT evaluation was requested.  The patient reported overall improvement in respiratory effort, oxygen weaned and NIPPV continued per pulmonology recommendations. She reported vaginal bleeding starting 5/18 for which OB/GYN was consulted and recommended megace initiation. Hgb has remained stable and within normal limits. On the day of discharge SpO2 dropped to 88% at the end of ambulation, so this will be provided at DC. Had no hypoxia at rest. PT recommended CIR, and then SNF once they felt she did not require CIR level of care. The patient would not be able to pay for her bills and go to SNF, so she opts to be discharged home with the help of family.   Discharge Diagnoses:  Active Problems:   Acute hypoxemic respiratory failure (HCC)   Altered mental status  Acute hypoxemic and hypercarbic respiratory failure: Due to OHS, OSA, and likely pulmonary venous thromboembolic disease/pulmonary hypertension in the setting of holding anticoagulation.  - Continue supplemental oxygen, did get down to 88% with ambulation on day of discharge.  Will provide supplemental O2.  - Continue CPAP qHS and with naps.  - Restarted anticoagulation, converted back to xarelto which will be continued.   Vaginal bleeding: Reported per  pt in setting of anticoagulation without pain, discharge, or hx AUB. No urinary symptoms either.  -OB/GYN started megace 68m po BID which will continue until outpatient follow up, ideally with pelvic U/S and possibly endometrial sampling. - Check Hgb at follow up, it has remained normal here.  Acute diastolic CHF: GT0ZSon echo, preserved LVEF.  - Control BP - s/p diuresis, does not appear volume overloaded peripherally  Troponin elevation due to myocardial demand ischemia: - Recommend outpatient follow up with cardiology - Continue anticoagulation  S/p scapholunate ligament reconstruction on 5/10:  - Orthopedics aware of admission, has been following, no immediate concerns. Plans to follow up as per routine in the outpatient setting between 10-15 days post operatively.  History of DVT/PE, current acute DVT of the left tibial and peroneal veins:  - Xarelto as above.  Morbid obesity:Estimated body mass index is 59.91 kg/m as calculated from the following: Height as of this encounter: _0  (1.727 m). Weight as of this encounter: 178.7 kg.  Note since discharge her breathing is improved. She is maintaining the Xarelto. She needs follow-up labs at this visit. She is not taking any blood pressure medication however at this time.  Patient states she is compliant with her CPAP  Past Medical History:  Diagnosis Date  . DVT (deep vein thrombosis) in pregnancy    RLE DVT 02/2016  . Dyspnea   . Morbid obesity ([place]   . PE (pulmonary embolism) 02/29/2016  . Peripheral vascular disease ([place]   . Sleep apnea    uses a cpap     No family history on file.   Social History   Socioeconomic History  . Marital status: Single    Spouse name: Not on file  . Number of children: Not on file  . Years of education: Not on file  . Highest education level: Not on file  Occupational History  . Not on file  Tobacco Use  . Smoking status: Former SResearch scientist (life sciences) . Smokeless tobacco: Never  Used  . Tobacco comment: smoked only as a teenager  Vaping Use  . Vaping Use: Never used  Substance and Sexual Activity  . Alcohol use: No  . Drug use: No  . Sexual activity: Not on file  Other Topics Concern  . Not on file  Social History Narrative  . Not on file   Social Determinants of Health   Financial Resource Strain:   . Difficulty of Paying Living Expenses:   Food Insecurity:   . Worried About RCharity fundraiserin the Last Year:   . RArboriculturistin the Last Year:   Transportation Needs:   . LFilm/video editor(Medical):   .Marland KitchenLack of Transportation (Non-Medical):   Physical Activity:   . Days of Exercise per Week:   . Minutes of Exercise per Session:   Stress:   . Feeling of Stress :   Social Connections:   . Frequency of Communication with Friends and Family:   . Frequency of Social Gatherings with Friends and Family:   . Attends Religious Services:   . Active Member of Clubs or Organizations:   . Attends CArchivistMeetings:   .Marland KitchenMarital Status:   Intimate Partner Violence:   . Fear of Current or Ex-Partner:   . Emotionally Abused:   .  Physically Abused:   . Sexually Abused:      No Known Allergies   Outpatient Medications Prior to Visit  Medication Sig Dispense Refill  . acetaminophen (TYLENOL) 325 MG tablet Take 2 tablets (650 mg total) by mouth every 6 (six) hours.    . diclofenac Sodium (VOLTAREN) 1 % GEL Apply 2-4 g topically 4 (four) times daily as needed (knee pain).     Marland Kitchen PRESCRIPTION MEDICATION CPAP- At bedtime    . rivaroxaban (XARELTO) 20 MG TABS tablet Take 1 tablet (20 mg total) by mouth daily with supper. after restarting per starter pack    . megestrol (MEGACE) 40 MG tablet Take 1 tablet (40 mg total) by mouth 2 (two) times daily. (Patient not taking: Reported on 02/09/2020) 60 tablet 0  . oxyCODONE (ROXICODONE) 5 MG immediate release tablet Take 1 tablet (5 mg total) by mouth every 6 (six) hours as needed for severe pain.  (Patient not taking: Reported on 02/09/2020) 20 tablet 0  . RIVAROXABAN (XARELTO) VTE STARTER PACK (15 & 20 MG TABLETS) Follow package directions: Take one 50m tablet by mouth twice a day. On day 22, switch to one 269mtablet once a day. Take with food. (Patient not taking: Reported on 02/09/2020) 51 each 0   No facility-administered medications prior to visit.      Review of Systems  Constitutional: Positive for activity change, appetite change and unexpected weight change.  HENT: Negative.   Respiratory: Positive for shortness of breath.   Cardiovascular: Positive for leg swelling.  Gastrointestinal: Negative.   Genitourinary: Negative.   Neurological: Negative.   Hematological: Negative.        Objective:   Physical Exam Vitals:   02/09/20 0928  BP: (!) 157/82  Pulse: 64  Resp: 18  Temp: 98 F (36.7 C)  SpO2: 95%  Weight: (!) 403 lb (182.8 kg)  Height: _0  (1.727 m)    Gen: Pleasant, morbidly obese, in no distress,  normal affect  ENT: No lesions,  mouth clear,  oropharynx clear, no postnasal drip  Neck: No JVD, no TMG, no carotid bruits  Lungs: No use of accessory muscles, no dullness to percussion, clear without rales or rhonchi  Cardiovascular: RRR, heart sounds normal, no murmur or gallops, no peripheral edema, extremities are quite large and there is a very large amount of fat medially in the left thigh with varicose veins  Abdomen: soft and NT, no HSM,  BS normal  Musculoskeletal: No deformities, no cyanosis or clubbing  Neuro: alert, non focal  Skin: Warm, no lesions or rashes  All labs and imaging studies from recent hospitalization reviewed in [place] link      Assessment & Plan:  I personally reviewed all images and lab [facility] as well as any outside material available during this office visit and agree with the  radiology impressions.   Chronic deep vein thrombosis (DVT) of femoral vein of left lower extremity (HCC) Chronic  left venous thrombosis of the femoral vein with associated probable recurrent pulmonary emboli  It is difficult to tell whether the Xarelto is an adequate therapy for this patient who is morbidly obese. In general DOAC's were not studied in morbidly obese individuals. There have been recent publications stating that one could use NOACs but they have to be closely monitored in this setting.  Obviously we could dose range this patient if necessary  More comfortable with apixaban in this population then Xarelto therefore I plan on  switching this patient to apixaban at 5 mg twice daily  We will check CBC today at this office visit she ultimately may need a second opinion hematology referral  Chronic anticoagulation At the very least this patient will need anticoagulant therapy for life  OSA on CPAP I recommend this patient continue her CPAP therapy with oxygen therapy nightly  Chronic respiratory failure with hypoxia ([place]) Chronic hypoxic respiratory failure patient recommended to continue nightly oxygen with CPAP no need for oxygen during the day  Elevated blood pressure reading Is unclear to me whether this patient has hypertension or not we will prescribe for her a blood pressure device with a large cuff which she will use and she will bring readings into her next primary care office visit   Diagnoses and all orders for this visit:  Chronic deep vein thrombosis (DVT) of femoral vein of left lower extremity (HCC) -     CBC with Differential/Platelet  Essential hypertension -     Comprehensive metabolic panel -     CBC with Differential/Platelet  Chronic respiratory failure with hypoxia (HCC)  OSA on CPAP  Morbid obesity ([place])  Chronic anticoagulation -     CBC with Differential/Platelet  Need for hepatitis C screening test -     Hepatitis C antibody  Elevated blood pressure reading  Other orders -     apixaban (ELIQUIS) 5 MG TABS tablet; Take 1 tablet (5 mg total) by  mouth 2 (two) times daily. -     Blood Pressure Monitor DEVI; Take blood pressure daily

## 2020-02-09 NOTE — Assessment & Plan Note (Signed)
Chronic hypoxic respiratory failure patient recommended to continue nightly oxygen with CPAP no need for oxygen during the day

## 2020-02-09 NOTE — Patient Instructions (Addendum)
Stop Xarelto Start Eliquis 47m twice a day Obtain large blood pressure cuff and check blood pressure daily, medicaid will pay for this,  [facility] should be able to charge medicaid   Labs today: blood counts, metabolic panel, hepatitis C  Return Ms FRaul Delin 6 weeks

## 2020-02-09 NOTE — Progress Notes (Signed)
Here for Xarelto meds refills

## 2020-02-09 NOTE — Assessment & Plan Note (Signed)
Is unclear to me whether this patient has hypertension or not we will prescribe for her a blood pressure device with a large cuff which she will use and she will bring readings into her next primary care office visit

## 2020-02-09 NOTE — Assessment & Plan Note (Signed)
Chronic left venous thrombosis of the femoral vein with associated probable recurrent pulmonary emboli  It is difficult to tell whether the Xarelto is an adequate therapy for this patient who is morbidly obese. In general DOAC's were not studied in morbidly obese individuals. There have been recent publications stating that one could use NOACs but they have to be closely monitored in this setting.  Obviously we could dose range this patient if necessary  More comfortable with apixaban in this population then Xarelto therefore I plan on switching this patient to apixaban at 5 mg twice daily  We will check CBC today at this office visit she ultimately may need a second opinion hematology referral

## 2020-02-10 LAB — CBC WITH DIFFERENTIAL/PLATELET
Basophils Absolute: 0.1 10*3/uL (ref 0.0–0.2)
Basos: 2 %
EOS (ABSOLUTE): 0.1 10*3/uL (ref 0.0–0.4)
Eos: 3 %
Hematocrit: 32.9 % — ABNORMAL LOW (ref 34.0–46.6)
Hemoglobin: 10.5 g/dL — ABNORMAL LOW (ref 11.1–15.9)
Immature Grans (Abs): 0.1 10*3/uL (ref 0.0–0.1)
Immature Granulocytes: 1 %
Lymphocytes Absolute: 1.3 10*3/uL (ref 0.7–3.1)
Lymphs: 29 %
MCH: 28.4 pg (ref 26.6–33.0)
MCHC: 31.9 g/dL (ref 31.5–35.7)
MCV: 89 fL (ref 79–97)
Monocytes Absolute: 0.4 10*3/uL (ref 0.1–0.9)
Monocytes: 8 %
Neutrophils Absolute: 2.6 10*3/uL (ref 1.4–7.0)
Neutrophils: 57 %
Platelets: 208 10*3/uL (ref 150–450)
RBC: 3.7 x10E6/uL — ABNORMAL LOW (ref 3.77–5.28)
RDW: 13.2 % (ref 11.7–15.4)
WBC: 4.5 10*3/uL (ref 3.4–10.8)

## 2020-02-10 LAB — COMPREHENSIVE METABOLIC PANEL
ALT: 10 IU/L (ref 0–32)
AST: 18 IU/L (ref 0–40)
Albumin/Globulin Ratio: 1.2 (ref 1.2–2.2)
Albumin: 3.7 g/dL — ABNORMAL LOW (ref 3.8–4.9)
Alkaline Phosphatase: 52 IU/L (ref 48–121)
BUN/Creatinine Ratio: 12 (ref 9–23)
BUN: 12 mg/dL (ref 6–24)
Bilirubin Total: 0.5 mg/dL (ref 0.0–1.2)
CO2: 25 mmol/L (ref 20–29)
Calcium: 9 mg/dL (ref 8.7–10.2)
Chloride: 106 mmol/L (ref 96–106)
Creatinine, Ser: 1.01 mg/dL — ABNORMAL HIGH (ref 0.57–1.00)
GFR calc Af Amer: 73 mL/min/{1.73_m2} (ref >59)
GFR calc non Af Amer: 63 mL/min/{1.73_m2} (ref >59)
Globulin, Total: 3.2 g/dL (ref 1.5–4.5)
Glucose: 79 mg/dL (ref 65–99)
Potassium: 4.2 mmol/L (ref 3.5–5.2)
Sodium: 141 mmol/L (ref 134–144)
Total Protein: 6.9 g/dL (ref 6.0–8.5)

## 2020-02-10 LAB — HEPATITIS C ANTIBODY: Hep C Virus Ab: <0.1 s/co ratio (ref 0.0–0.9)

## 2020-08-18 ENCOUNTER — Other Ambulatory Visit: Payer: Self-pay

## 2020-08-18 ENCOUNTER — Emergency Department (HOSPITAL_COMMUNITY): Payer: Medicaid Other

## 2020-08-18 ENCOUNTER — Encounter (HOSPITAL_COMMUNITY): Payer: Self-pay | Admitting: Family Medicine

## 2020-08-18 ENCOUNTER — Inpatient Hospital Stay (HOSPITAL_COMMUNITY)
Admission: EM | Admit: 2020-08-18 | Discharge: 2020-08-25 | DRG: 189 | Disposition: A | Discharge status: Home Health | Payer: Medicaid Other | Attending: Internal Medicine | Admitting: Internal Medicine

## 2020-08-18 DIAGNOSIS — Z713 Dietary counseling and surveillance: Secondary | ICD-10-CM

## 2020-08-18 DIAGNOSIS — Z6841 Body Mass Index (BMI) 40.0 and over, adult: Secondary | ICD-10-CM | POA: Diagnosis present

## 2020-08-18 DIAGNOSIS — J9611 Chronic respiratory failure with hypoxia: Secondary | ICD-10-CM | POA: Diagnosis not present

## 2020-08-18 DIAGNOSIS — J9601 Acute respiratory failure with hypoxia: Secondary | ICD-10-CM | POA: Diagnosis not present

## 2020-08-18 DIAGNOSIS — R0602 Shortness of breath: Secondary | ICD-10-CM | POA: Diagnosis not present

## 2020-08-18 DIAGNOSIS — I739 Peripheral vascular disease, unspecified: Secondary | ICD-10-CM | POA: Diagnosis present

## 2020-08-18 DIAGNOSIS — E662 Morbid (severe) obesity with alveolar hypoventilation: Secondary | ICD-10-CM | POA: Diagnosis present

## 2020-08-18 DIAGNOSIS — Z86711 Personal history of pulmonary embolism: Secondary | ICD-10-CM

## 2020-08-18 DIAGNOSIS — I825Z2 Chronic embolism and thrombosis of unspecified deep veins of left distal lower extremity: Secondary | ICD-10-CM | POA: Diagnosis present

## 2020-08-18 DIAGNOSIS — J9622 Acute and chronic respiratory failure with hypercapnia: Principal | ICD-10-CM | POA: Diagnosis present

## 2020-08-18 DIAGNOSIS — D72819 Decreased white blood cell count, unspecified: Secondary | ICD-10-CM | POA: Diagnosis present

## 2020-08-18 DIAGNOSIS — Z9119 Patient's noncompliance with other medical treatment and regimen: Secondary | ICD-10-CM

## 2020-08-18 DIAGNOSIS — I10 Essential (primary) hypertension: Secondary | ICD-10-CM | POA: Diagnosis present

## 2020-08-18 DIAGNOSIS — J9610 Chronic respiratory failure, unspecified whether with hypoxia or hypercapnia: Secondary | ICD-10-CM | POA: Diagnosis present

## 2020-08-18 DIAGNOSIS — Z87891 Personal history of nicotine dependence: Secondary | ICD-10-CM

## 2020-08-18 DIAGNOSIS — Z20822 Contact with and (suspected) exposure to covid-19: Secondary | ICD-10-CM | POA: Diagnosis present

## 2020-08-18 DIAGNOSIS — Z9989 Dependence on other enabling machines and devices: Secondary | ICD-10-CM

## 2020-08-18 DIAGNOSIS — I2721 Secondary pulmonary arterial hypertension: Secondary | ICD-10-CM | POA: Diagnosis present

## 2020-08-18 DIAGNOSIS — R17 Unspecified jaundice: Secondary | ICD-10-CM | POA: Diagnosis present

## 2020-08-18 DIAGNOSIS — I272 Pulmonary hypertension, unspecified: Secondary | ICD-10-CM

## 2020-08-18 DIAGNOSIS — Z79899 Other long term (current) drug therapy: Secondary | ICD-10-CM

## 2020-08-18 DIAGNOSIS — Z7901 Long term (current) use of anticoagulants: Secondary | ICD-10-CM

## 2020-08-18 DIAGNOSIS — J9690 Respiratory failure, unspecified, unspecified whether with hypoxia or hypercapnia: Secondary | ICD-10-CM

## 2020-08-18 DIAGNOSIS — J449 Chronic obstructive pulmonary disease, unspecified: Secondary | ICD-10-CM | POA: Diagnosis not present

## 2020-08-18 DIAGNOSIS — I517 Cardiomegaly: Secondary | ICD-10-CM | POA: Diagnosis not present

## 2020-08-18 DIAGNOSIS — J9621 Acute and chronic respiratory failure with hypoxia: Secondary | ICD-10-CM | POA: Diagnosis present

## 2020-08-18 DIAGNOSIS — E872 Acidosis: Secondary | ICD-10-CM | POA: Diagnosis present

## 2020-08-18 DIAGNOSIS — K5900 Constipation, unspecified: Secondary | ICD-10-CM | POA: Diagnosis present

## 2020-08-18 DIAGNOSIS — D649 Anemia, unspecified: Secondary | ICD-10-CM | POA: Diagnosis present

## 2020-08-18 DIAGNOSIS — R944 Abnormal results of kidney function studies: Secondary | ICD-10-CM | POA: Diagnosis present

## 2020-08-18 DIAGNOSIS — E66813 Obesity, class 3: Secondary | ICD-10-CM | POA: Diagnosis present

## 2020-08-18 DIAGNOSIS — M351 Other overlap syndromes: Secondary | ICD-10-CM | POA: Diagnosis present

## 2020-08-18 DIAGNOSIS — I2699 Other pulmonary embolism without acute cor pulmonale: Secondary | ICD-10-CM | POA: Diagnosis not present

## 2020-08-18 DIAGNOSIS — G4733 Obstructive sleep apnea (adult) (pediatric): Secondary | ICD-10-CM | POA: Diagnosis present

## 2020-08-18 DIAGNOSIS — G9349 Other encephalopathy: Secondary | ICD-10-CM | POA: Diagnosis present

## 2020-08-18 DIAGNOSIS — Z9114 Patient's other noncompliance with medication regimen: Secondary | ICD-10-CM

## 2020-08-18 LAB — BASIC METABOLIC PANEL
Anion gap: 10 (ref 5–15)
BUN: 15 mg/dL (ref 6–20)
CO2: 30 mmol/L (ref 22–32)
Calcium: 9 mg/dL (ref 8.9–10.3)
Chloride: 101 mmol/L (ref 98–111)
Creatinine, Ser: 1.02 mg/dL — ABNORMAL HIGH (ref 0.44–1.00)
GFR, Estimated: >60 mL/min (ref >60)
Glucose, Bld: 78 mg/dL (ref 70–99)
Potassium: 4.2 mmol/L (ref 3.5–5.1)
Sodium: 141 mmol/L (ref 135–145)

## 2020-08-18 LAB — CBC
HCT: 41.9 % (ref 36.0–46.0)
Hemoglobin: 13 g/dL (ref 12.0–15.0)
MCH: 29.7 pg (ref 26.0–34.0)
MCHC: 31 g/dL (ref 30.0–36.0)
MCV: 95.7 fL (ref 80.0–100.0)
Platelets: 226 10*3/uL (ref 150–400)
RBC: 4.38 MIL/uL (ref 3.87–5.11)
RDW: 14.2 % (ref 11.5–15.5)
WBC: 4.6 10*3/uL (ref 4.0–10.5)
nRBC: 0 % (ref 0.0–0.2)

## 2020-08-18 LAB — SARS CORONAVIRUS 2 BY RT PCR (HOSPITAL ORDER, PERFORMED IN ~~LOC~~ HOSPITAL LAB): SARS Coronavirus 2: NEGATIVE

## 2020-08-18 LAB — D-DIMER, QUANTITATIVE: D-Dimer, Quant: 2.1 ug/mL-FEU — ABNORMAL HIGH (ref 0.00–0.50)

## 2020-08-18 LAB — BRAIN NATRIURETIC PEPTIDE: B Natriuretic Peptide: 30.6 pg/mL (ref 0.0–100.0)

## 2020-08-18 LAB — TROPONIN I (HIGH SENSITIVITY): Troponin I (High Sensitivity): 5 ng/L (ref <18)

## 2020-08-18 MED ORDER — AEROCHAMBER PLUS FLO-VU LARGE MISC
Status: AC
  Administered 2020-08-18: 1
  Filled 2020-08-18: qty 1

## 2020-08-18 MED ORDER — ALBUTEROL SULFATE HFA 108 (90 BASE) MCG/ACT IN AERS
2.0000 | INHALATION_SPRAY | RESPIRATORY_TRACT | Status: DC | PRN
  Administered 2020-08-18: 2 via RESPIRATORY_TRACT
  Filled 2020-08-18: qty 6.7

## 2020-08-18 MED ORDER — IOHEXOL 350 MG/ML SOLN
75.0000 mL | Freq: Once | INTRAVENOUS | Status: AC | PRN
  Administered 2020-08-18: 75 mL via INTRAVENOUS

## 2020-08-18 MED ORDER — APIXABAN 5 MG PO TABS: 5.0000 mg | ORAL_TABLET | Freq: Two times a day (BID) | ORAL | 1 refills | Status: DC

## 2020-08-18 NOTE — ED Notes (Signed)
Pt's O2 sats begin to decline to 85% room air. Pt placed on 3L and returned to sats of 93%. [place], [place] notified.

## 2020-08-18 NOTE — H&P (Signed)
History and Physical    Teddy Baby VZD:[id number] DOB: 01/11/1966 DOA: 08/18/2020  PCP: [name], NP   Patient coming from:   Home  Chief Complaint: SOB  HPI: Angel Brewer is a 55 y.o. female with medical history significant for DVT, PE, morbid obesity, sleep apnea on CPAP presents with complaints of worsening shortness of breath for the past several weeks.  She reports shortness of breath when she is resting and going to sleep. Feeling of SOB will wake her up. She states she is using her CPAP at night but this SOB feels different than her usual OSA. She reports mild SOB with activity intermittently. She denies any cough, fevers, chills.  Denies any chest pain.  She denies any new leg swelling, she does have mild chronic edema of lower legs. She has hx of DVT and PE in past.  She reports that she has not been taking her Eliquis for the past few months due to having difficulty getting refill.  She states she was told that she needs to be anticoagulated for life with her history. She does not have oxygen to use at home. On arrival, pulse ox of 88%, she was placed on 2 L nasal cannula. She was weaned off oxygen in the ER but her O2 sat fell to 84% and oxygen was resumed.   ED Course:  CTA chest shows no PE but does have pulmonary hypertension. She is afebrile with no infiltrates on CXR or CT chest. Labs unremarkable.   Review of Systems:  General: Denies  fever, chills, weight loss, night sweats.  Denies dizziness.  Denies change in appetite HENT: Denies head trauma, headache, denies change in hearing, tinnitus. Denies nasal congestion or bleeding.  Denies sore throat, sores in mouth. Denies difficulty swallowing Eyes: Denies blurry vision, pain in eye, drainage.  Denies discoloration of eyes. Neck: Denies pain.  Denies swelling.  Denies pain with movement. Cardiovascular: Denies chest pain, palpitations.  Reports chronic edema.  Denies orthopnea Respiratory: Reports shortness of  breath. Denies cough.  Denies wheezing.  Denies sputum production Gastrointestinal: Denies abdominal pain, swelling.  Denies nausea, vomiting, diarrhea.  Denies melena.  Denies hematemesis. Musculoskeletal: Denies limitation of movement.  Denies pain.  Denies arthralgias or myalgias. Genitourinary: Denies pelvic pain.  Denies urinary frequency or hesitancy.  Denies dysuria.  Skin: Denies rash.  Denies petechiae, purpura, ecchymosis. Neurological: Denies headache. Denies syncope. Denies seizure activity. Denies paresthesia. Denies slurred speech, drooping face.  Denies visual change. Psychiatric: Denies depression, anxiety.  Denies hallucinations.  Past Medical History:  Diagnosis Date  . DVT (deep vein thrombosis) in pregnancy    RLE DVT 02/2016  . Dyspnea   . Morbid obesity ([place])   . PE (pulmonary embolism) 02/29/2016  . Peripheral vascular disease ([place])   . Sleep apnea    uses a cpap    Past Surgical History:  Procedure Laterality Date  . CESAREAN SECTION     x 3  . OPEN REDUCTION INTERNAL FIXATION (ORIF) DISTAL RADIAL FRACTURE Right 06/20/2019   Procedure: OPEN REDUCTION INTERNAL FIXATION (ORIF) DISTAL RADIAL FRACTURE;  Surgeon: [name], MD;  Location: [facility];  Service: Orthopedics;  Laterality: Right;  REGIONAL BLOCK TOTAL SURGERY TIME: 90 MINUTES  . SYNOVECTOMY Right 11/21/2019   Procedure: RIGHT WRIST LIGAMENT RECONSTRUCTION;  Surgeon: [name], MD;  Location: [facility];  Service: Orthopedics;  Laterality: Right;  PROCEDURE: RIGHT WRIST LIGAMENT RECONSTRUCTION LENGTH OF SURGERY: 105 MIN    Social History  reports that she  has quit smoking. She has never used smokeless tobacco. She reports that she does not drink alcohol and does not use drugs.  No Known Allergies  History reviewed. No pertinent family history.   Prior to Admission medications   Medication Sig Start Date End Date Taking? Authorizing Provider  acetaminophen (TYLENOL) 325 MG tablet Take 2 tablets  (650 mg total) by mouth every 6 (six) hours. Patient taking differently: Take 650 mg by mouth every 6 (six) hours as needed for mild pain or headache. 11/21/19  Yes [name], MD  apixaban (ELIQUIS) 5 MG TABS tablet Take 1 tablet (5 mg total) by mouth 2 (two) times daily. 08/18/20 12/16/20 Yes Garald Balding, PA-C  Blood Pressure Monitor DEVI Take blood pressure daily 02/09/20  Yes Elsie Stain, MD  ibuprofen (ADVIL) 200 MG tablet Take 600 mg by mouth every 6 (six) hours as needed for headache or moderate pain.   Yes [provider]  PRESCRIPTION MEDICATION CPAP- At bedtime   Yes [provider]    Physical Exam: Vitals:   08/18/20 2149 08/18/20 2150 08/18/20 2200 08/18/20 2215  BP:   (!) 122/102 (!) 148/104  Pulse: 67 73 71 74  Resp: _0 (!) 23  Temp:      TempSrc:      SpO2: 91% 96% 100% 100%    Constitutional: NAD, calm, comfortable Vitals:   08/18/20 2149 08/18/20 2150 08/18/20 2200 08/18/20 2215  BP:   (!) 122/102 (!) 148/104  Pulse: 67 73 71 74  Resp: _1 (!) 23  Temp:      TempSrc:      SpO2: 91% 96% 100% 100%   General: WDWN, Alert and oriented x3.  Eyes: EOMI, PERRL, conjunctivae normal.  Sclera nonicteric HENT:  [place]/AT, external ears normal.  Nares patent without epistasis.  Mucous membranes are moist.   Neck: Soft, normal range of motion, supple, no masses, no thyromegaly.  Trachea midline Respiratory: Diminished but clear to auscultation bilaterally, no wheezing, no crackles. Normal respiratory effort. No accessory muscle use.  Cardiovascular: Regular rate and rhythm, no murmurs / rubs / gallops. Has lower extremity edema. 1+ pedal pulses. Abdomen: Soft, no tenderness, nondistended, no rebound or guarding. Morbidly obese. No masses palpated. Bowel sounds normoactive Musculoskeletal: FROM. no cyanosis. Normal muscle tone.  Skin: Warm, dry, intact no rashes, lesions, ulcers. No induration Neurologic: CN 2-12 grossly intact.  Normal  speech.  Sensation intact, Strength 5/5 in all extremities.   Psychiatric: Normal judgment and insight.  Normal mood.    Labs on Admission: I have personally reviewed following labs and imaging studies  CBC: Recent Labs  Lab 08/18/20 0246  WBC 4.6  HGB 13.0  HCT 41.9  MCV 95.7  PLT 130    Basic Metabolic Panel: Recent Labs  Lab 08/18/20 0246  NA 141  K 4.2  CL 101  CO2 30  GLUCOSE 78  BUN 15  CREATININE 1.02*  CALCIUM 9.0    GFR: CrCl cannot be calculated (Unknown ideal weight.).  Liver Function Tests: No results for input(s): AST, ALT, ALKPHOS, BILITOT, PROT, ALBUMIN in the last 168 hours.  Urine analysis:    Component Value Date/Time   COLORURINE AMBER (A) 11/23/2019 0220   APPEARANCEUR CLOUDY (A) 11/23/2019 0220   LABSPEC 1.027 11/23/2019 0220   PHURINE 5.0 11/23/2019 0220   GLUCOSEU NEGATIVE 11/23/2019 0220   HGBUR NEGATIVE 11/23/2019 0220   BILIRUBINUR NEGATIVE 11/23/2019 0220   KETONESUR NEGATIVE 11/23/2019 0220  PROTEINUR 100 (A) 11/23/2019 0220   NITRITE NEGATIVE 11/23/2019 0220   LEUKOCYTESUR NEGATIVE 11/23/2019 0220    Radiological Exams on Admission: DG Chest 2 View  Result Date: 08/18/2020 CLINICAL DATA:  Shortness of breath EXAM: CHEST - 2 VIEW COMPARISON:  11/23/2019 FINDINGS: Cardiomegaly, vascular congestion. No overt edema. Lingular atelectasis or scarring. No effusions or acute bony abnormality. IMPRESSION: Cardiomegaly, vascular congestion. Electronically Signed   By: Rolm Baptise M.D.   On: 08/18/2020 03:12   CT Angio Chest PE W and/or Wo Contrast  Result Date: 08/18/2020 CLINICAL DATA:  Shortness of breath. Difficulty sleeping. COPD. Pulmonary emboli suspected. EXAM: CT ANGIOGRAPHY CHEST WITH CONTRAST TECHNIQUE: Multidetector CT imaging of the chest was performed using the standard protocol during bolus administration of intravenous contrast. Multiplanar CT image reconstructions and MIPs were obtained to evaluate the vascular anatomy.  CONTRAST:  55m OMNIPAQUE IOHEXOL 350 MG/ML SOLN COMPARISON:  Chest radiography same day. Previous CT angiography 06/13/2019. previous CT 02/29/2016 FINDINGS: Cardiovascular: The heart is enlarged. No pericardial effusion. No visible coronary artery calcification. Some aortic atherosclerotic calcification is present. Pulmonary arteries are prominent consistent with pulmonary arterial hypertension. Pulmonary arterial opacification is moderate. No pulmonary emboli are seen. Small distal emboli might be inapparent. No large or central emboli. Mediastinum/Nodes: Chronic prominent nodes in the right hilum, subcarinal region and right paratracheal region. These have not changed or worsened since 2017. Lungs/Pleura: No pleural effusion. Mosaic attenuation pattern of the lungs as seen chronically. Seen in association with adenopathy, sarcoid could possibly give this appearance. Upper Abdomen: Negative Musculoskeletal: Ordinary thoracic degenerative changes. Review of the MIP images confirms the above findings. IMPRESSION: 1. No large or central pulmonary emboli. Pulmonary arterial opacification is only moderate. Small distal emboli might be inapparent. 2. Cardiomegaly. Enlarged pulmonary arteries consistent with pulmonary arterial hypertension. 3. Chronic enlargement of right hilar, subcarinal and paratracheal lymph nodes. 4. Chronic mosaic attenuation pattern of the lungs as seen chronically. Seen in association with adenopathy, sarcoid could possibly give this appearance. 5. Aortic atherosclerosis. Aortic Atherosclerosis (ICD10-I70.0). Electronically Signed   By: MNelson ChimesM.D.   On: 08/18/2020 20:39    EKG: Independently reviewed. EKG shows NSR. No acute ST elevation or depression. QTc 457  Assessment/Plan Principal Problem:   Chronic respiratory failure with hypoxia  Ms. Basich is placed on med surg for observation. She had oxygen saturation of 84-88% on RA in ER. She is requiring supplemental oxygen to  maintain O2 sats of 92-96%. Does not have home oxygen. May need home oxygen which will be determined during hospitalization ABG ordered and pending Incentive spirometer every two hours while awake.  Albuterol MDI with spacer as needed for SOB  Active Problems:   Essential hypertension Start losartan for elevated blood pressure readings. Monitor blood pressure Monitor electrolytes.    OSA on CPAP Continue CPAP at night for OSA.    Pulmonary hypertension With history of pulmonary hypertension identified on previous echocardiogram from May 2021. CT angiography shows changes consistent with pulmonary pretension. Obtain echocardiogram to compare to previous readings     Morbid obesity  Follow with PCP for dietary, lifestyle, pharmacotherapy interventions for weight loss. Patient may also benefit from bariatric surgery referral for evaluation    History of DVT/PE Resume anticoagulation with eliquis.     DVT prophylaxis: Pt is resumed on Eliquis for anticoagulation with hx of DVTs Code Status:   Full code  Family Communication:  Diagnosis and plan discussed with patient and she agrees with plan. Further recommendations to  follow as clinically indicated Disposition Plan:   Patient is from:  Home  Anticipated DC to:  Home  Anticipated DC date:  Anticipate less than 2 midnight stay  Anticipated DC barriers: Arranging home oxygen would be only barrier identified to discharge  Admission status:  Observation   Eben Burow MD [facility]  How to contact the [facility] Attending or Consulting provider [place] or covering provider during after hours [place], for this patient?   1. Check the care team in [facility] and look for a) attending/consulting [facility] provider listed and b) the [facility] team listed 2. Log into [email or web address] and use [place]'s universal password to access. If you do not have the password, please contact the hospital operator. 3. Locate the [facility] provider you are looking for  under [facility] and page to a number that you can be directly reached. 4. If you still have difficulty reaching the provider, please page the [facility] (Director on Call) for the Hospitalists listed on amion for assistance.  08/18/2020, 11:34 PM

## 2020-08-18 NOTE — ED Notes (Signed)
Pt de-sated after getting on stretcher, O2 was 84%. Pt placed back on 2L [place]. O2 now 98-100%

## 2020-08-18 NOTE — Discharge Instructions (Addendum)
Your work-up today was overall reassuring.  You need to make sure you follow-up with your primary care doctor and your pulmonologist about your symptoms.  As discussed, you need to make sure that you are taking your Eliquis.  We will refill this today.  Please take as directed.

## 2020-08-18 NOTE — ED Triage Notes (Signed)
Pt presents to ED POV. Pt c/o SOB x2w. Pt reports that resp is getting worse and is worse when sleeping. Pt has home CPAP and does not hae relief w. It. Pt has hx of PE, denies fevers at home. resp e/u.

## 2020-08-18 NOTE — ED Notes (Signed)
Patient transported to CT 

## 2020-08-18 NOTE — ED Notes (Signed)
EDP was made aware of pt desating with a good plef on RA.

## 2020-08-18 NOTE — ED Provider Notes (Addendum)
[facility] EMERGENCY DEPARTMENT Provider Note   CSN: [id number] Arrival date & time: 08/18/20  0222     History Chief Complaint  Patient presents with  . Shortness of Breath    Angel Brewer is a 55 y.o. female.  HPI 55 year old female with a history of DVT, PE, morbid obesity, sleep apnea on CPAP presents to the ER with complaints of worsening shortness of breath times several weeks.  She reports shortness of breath with exertion and at rest.  She denies any cough, fevers, chills.  Denies any chest pain.  She states that this feels different than if she does not use her CPAP.  Feels more short of breath with sleep.  She denies any leg swelling.  She reports that she has not been taking her Eliquis, states that her doctor said that she cannot get a refill.  She has very limited knowledge as to why she is not getting her Eliquis.  Per chart review, patient seen by pulmonology in July, in their note they state that she needs to be anticoagulated for life, though they were trying different methods of anticoagulation.  Last note reports that they would like for her to be on Eliquis.  On arrival, pulse ox of 88%, she was placed on 2 L nasal cannula.  She does not require supplemental O2 at home.  Per chart review, patient with CPAP and home O2 at night.    Past Medical History:  Diagnosis Date  . DVT (deep vein thrombosis) in pregnancy    RLE DVT 02/2016  . Dyspnea   . Morbid obesity ([place])   . PE (pulmonary embolism) 02/29/2016  . Peripheral vascular disease ([place])   . Sleep apnea    uses a cpap    Patient Active Problem List   Diagnosis Date Noted  . Chronic deep vein thrombosis (DVT) of femoral vein of left lower extremity ([place]) 02/09/2020  . Chronic anticoagulation 02/09/2020  . Elevated blood pressure reading 02/09/2020  . Right wrist fracture 06/14/2019  . Chronic respiratory failure with hypoxia ([place]) 06/13/2019  . Essential hypertension 08/25/2017  . OSA on  CPAP 02/03/2017  . Morbid obesity ([place]) 10/29/2016  . Vitamin D deficiency 10/29/2016    Past Surgical History:  Procedure Laterality Date  . CESAREAN SECTION     x 3  . OPEN REDUCTION INTERNAL FIXATION (ORIF) DISTAL RADIAL FRACTURE Right 06/20/2019   Procedure: OPEN REDUCTION INTERNAL FIXATION (ORIF) DISTAL RADIAL FRACTURE;  Surgeon: [name], MD;  Location: [facility];  Service: Orthopedics;  Laterality: Right;  REGIONAL BLOCK TOTAL SURGERY TIME: 90 MINUTES  . SYNOVECTOMY Right 11/21/2019   Procedure: RIGHT WRIST LIGAMENT RECONSTRUCTION;  Surgeon: [name], MD;  Location: [facility];  Service: Orthopedics;  Laterality: Right;  PROCEDURE: RIGHT WRIST LIGAMENT RECONSTRUCTION LENGTH OF SURGERY: 105 MIN     OB History   No obstetric history on file.     No family history on file.  Social History   Tobacco Use  . Smoking status: Former Research scientist (life sciences)  . Smokeless tobacco: Never Used  . Tobacco comment: smoked only as a teenager  Vaping Use  . Vaping Use: Never used  Substance Use Topics  . Alcohol use: No  . Drug use: No    Home Medications Prior to Admission medications   Medication Sig Start Date End Date Taking? Authorizing Provider  acetaminophen (TYLENOL) 325 MG tablet Take 2 tablets (650 mg total) by mouth every 6 (six) hours. 11/21/19   [name],  MD  apixaban (ELIQUIS) 5 MG TABS tablet Take 1 tablet (5 mg total) by mouth 2 (two) times daily. 08/18/20 12/16/20  Garald Balding, PA-C  Blood Pressure Monitor DEVI Take blood pressure daily 02/09/20   Elsie Stain, MD  diclofenac Sodium (VOLTAREN) 1 % GEL Apply 2-4 g topically 4 (four) times daily as needed (knee pain).     [provider]  PRESCRIPTION MEDICATION CPAP- At bedtime    [provider]    Allergies    Patient has no known allergies.  Review of Systems   Review of Systems  Constitutional: Negative for chills and fever.  HENT: Negative for ear pain and sore throat.   Eyes: Negative  for pain and visual disturbance.  Respiratory: Positive for chest tightness and shortness of breath. Negative for cough.   Cardiovascular: Negative for chest pain and palpitations.  Gastrointestinal: Negative for abdominal pain and vomiting.  Genitourinary: Negative for dysuria and hematuria.  Musculoskeletal: Negative for arthralgias and back pain.  Skin: Negative for color change and rash.  Neurological: Negative for seizures and syncope.  All other systems reviewed and are negative.   Physical Exam Updated Vital Signs BP 116/73   Pulse 79   Temp 97.9 F (36.6 C) (Oral)   Resp 19   LMP 11/29/2015   SpO2 93%   Physical Exam Vitals and nursing note reviewed.  Constitutional:      General: She is not in acute distress.    Appearance: She is well-developed and well-nourished. She is obese. She is not ill-appearing or diaphoretic.  HENT:     Head: Normocephalic and atraumatic.  Eyes:     Conjunctiva/sclera: Conjunctivae normal.  Cardiovascular:     Rate and Rhythm: Normal rate and regular rhythm.     Heart sounds: No murmur heard.   Pulmonary:     Effort: Tachypnea and accessory muscle usage present. No respiratory distress.     Breath sounds: Wheezing present. No decreased breath sounds, rhonchi or rales.     Comments: Mildly tachypneic, some accessory muscle usage, speaking in short sentences Chest:     Chest wall: No tenderness.  Abdominal:     Palpations: Abdomen is soft.     Tenderness: There is no abdominal tenderness.  Musculoskeletal:        General: No edema.     Cervical back: Normal range of motion and neck supple.     Comments: No edema to lower extremities, no redness, warmth  Skin:    General: Skin is warm and dry.  Neurological:     Mental Status: She is alert.  Psychiatric:        Mood and Affect: Mood and affect normal.     ED Results / Procedures / Treatments   Labs (all labs ordered are listed, but only abnormal results are displayed) Labs  Reviewed  BASIC METABOLIC PANEL - Abnormal; Notable for the following components:      Result Value   Creatinine, Ser 1.02 (*)    All other components within normal limits  D-DIMER, QUANTITATIVE (NOT AT [facility]) - Abnormal; Notable for the following components:   D-Dimer, Quant 2.10 (*)    All other components within normal limits  SARS CORONAVIRUS 2 BY RT PCR (HOSPITAL ORDER, [facility])  CBC  BRAIN NATRIURETIC PEPTIDE  TROPONIN I (HIGH SENSITIVITY)    EKG EKG Interpretation  Date/Time:  Saturday August 18 2020 02:30:56 EST Ventricular Rate:  82 PR Interval:  168 QRS Duration: 80 QT Interval:  392 QTC Calculation: 457 R Axis:   79 Text Interpretation: Normal sinus rhythm Normal ECG Nonspecific ST and T wave abnormality RESOLVED SINCE PREVIOUS Confirmed by Blanchie Dessert [phone]) on 08/18/2020 6:31:39 PM   Radiology DG Chest 2 View  Result Date: 08/18/2020 CLINICAL DATA:  Shortness of breath EXAM: CHEST - 2 VIEW COMPARISON:  11/23/2019 FINDINGS: Cardiomegaly, vascular congestion. No overt edema. Lingular atelectasis or scarring. No effusions or acute bony abnormality. IMPRESSION: Cardiomegaly, vascular congestion. Electronically Signed   By: Rolm Baptise M.D.   On: 08/18/2020 03:12   CT Angio Chest PE W and/or Wo Contrast  Result Date: 08/18/2020 CLINICAL DATA:  Shortness of breath. Difficulty sleeping. COPD. Pulmonary emboli suspected. EXAM: CT ANGIOGRAPHY CHEST WITH CONTRAST TECHNIQUE: Multidetector CT imaging of the chest was performed using the standard protocol during bolus administration of intravenous contrast. Multiplanar CT image reconstructions and MIPs were obtained to evaluate the vascular anatomy. CONTRAST:  4m OMNIPAQUE IOHEXOL 350 MG/ML SOLN COMPARISON:  Chest radiography same day. Previous CT angiography 06/13/2019. previous CT 02/29/2016 FINDINGS: Cardiovascular: The heart is enlarged. No pericardial effusion. No visible coronary artery  calcification. Some aortic atherosclerotic calcification is present. Pulmonary arteries are prominent consistent with pulmonary arterial hypertension. Pulmonary arterial opacification is moderate. No pulmonary emboli are seen. Small distal emboli might be inapparent. No large or central emboli. Mediastinum/Nodes: Chronic prominent nodes in the right hilum, subcarinal region and right paratracheal region. These have not changed or worsened since 2017. Lungs/Pleura: No pleural effusion. Mosaic attenuation pattern of the lungs as seen chronically. Seen in association with adenopathy, sarcoid could possibly give this appearance. Upper Abdomen: Negative Musculoskeletal: Ordinary thoracic degenerative changes. Review of the MIP images confirms the above findings. IMPRESSION: 1. No large or central pulmonary emboli. Pulmonary arterial opacification is only moderate. Small distal emboli might be inapparent. 2. Cardiomegaly. Enlarged pulmonary arteries consistent with pulmonary arterial hypertension. 3. Chronic enlargement of right hilar, subcarinal and paratracheal lymph nodes. 4. Chronic mosaic attenuation pattern of the lungs as seen chronically. Seen in association with adenopathy, sarcoid could possibly give this appearance. 5. Aortic atherosclerosis. Aortic Atherosclerosis (ICD10-I70.0). Electronically Signed   By: MNelson ChimesM.D.   On: 08/18/2020 20:39    Procedures Procedures   Medications Ordered in ED Medications  albuterol (VENTOLIN HFA) 108 (90 Base) MCG/ACT inhaler 2 puff (2 puffs Inhalation Given 08/18/20 2114)  iohexol (OMNIPAQUE) 350 MG/ML injection 75 mL (75 mLs Intravenous Contrast Given 08/18/20 2001)  AeroChamber Plus Flo-Vu Large MISC (1 each  Given 08/18/20 2114)    ED Course  I have reviewed the triage vital signs and the nursing notes.  Pertinent labs & imaging results that were available during my care of the patient were reviewed by me and considered in my medical decision making (see  chart for details).    MDM Rules/Calculators/A&P                         55year old female presents to the ER with complaints of shortness of breath. In triage, patient was noted to be at 88% on room air, she was placed on 2 L nasal cannula.  She does have some home O2 at night, but does not have daily oxygen requirements.  Her other vitals otherwise showed a blood pressure of 149/38, overall reassuring.  No evidence of hypoxia when she was taken off supplemental O2.  Patient also ambulated to the bathroom with nursing staff, had  a pulse ox on, sats stayed above 95%.  No evidence of tachycardia.  Physical exam with no significant lower extremity edema, lung sounds clear with some mild wheezes, patient is speaking in short sentences.  DDx includes PE, CHF exacerbation, pneumonia, COVID-19, ACS  CBC without leukocytosis, normal hemoglobin  BMP without any significant electrode normalities, mildly elevated creatinine of 1.02. Covid test is negative Chest x-ray without acute abnormalities D-dimer is elevated at 2.1 BNP is normal Troponin is normal  EKG normal sinus rhythm.  Given elevated D-dimer, CT angio was ordered.  This showed chronic lung changes, however no evidence of PE.  Overall work-up reassuring.  No evidence of PE, ACS, low suspicion for dissection, no evidence of pneumonia, COVID-19, heart failure.  Patient not on supplemental O2, no evidence of hypoxia here.  Discussed with pharmacy, Jenny Reichmann, about restarting the patient on Eliquis.  Per chart review, last note from July 2021 states that the patient should be on anticoagulation for life.  I do not see any notes indicating that the patient should stop anticoagulation.  I discussed this with the patient, who is agreeable to restart her Eliquis.  We will start her back on her 5 mg twice daily.  I stressed follow-up with her PCP/pulmonologist.  We discussed very strict return precautions, she voiced understanding and is agreeable.   At  discharge, patient is noted that the patient was hypoxic on room air at 85%.  She was placed on 3 L, sats increased to 93%.  Patient reports that she has no baseline O2 requirements and no oxygen at home.  She will need admission for acute on chronic respiratory failure.  Consulted hospitalist team.  Spoke with [facility] hospitalist team who will admit the patient. Ordered VBG per their request. Patient remains hemodynamically stable here in the ED.   Final Clinical Impression(s) / ED Diagnoses Final diagnoses:  SOB (shortness of breath)    Rx / DC Orders ED Discharge Orders         Ordered    apixaban (ELIQUIS) 5 MG TABS tablet  2 times daily        08/18/20 2148                Lyndel Safe 08/18/20 2310    Blanchie Dessert, MD 08/19/20 2056

## 2020-08-18 NOTE — ED Notes (Signed)
Pt placed on nasal cannula at 2L

## 2020-08-19 ENCOUNTER — Observation Stay (HOSPITAL_COMMUNITY): Payer: Medicaid Other

## 2020-08-19 ENCOUNTER — Observation Stay (HOSPITAL_BASED_OUTPATIENT_CLINIC_OR_DEPARTMENT_OTHER): Payer: Medicaid Other

## 2020-08-19 DIAGNOSIS — I272 Pulmonary hypertension, unspecified: Secondary | ICD-10-CM | POA: Diagnosis not present

## 2020-08-19 DIAGNOSIS — Z9114 Patient's other noncompliance with medication regimen: Secondary | ICD-10-CM | POA: Diagnosis not present

## 2020-08-19 DIAGNOSIS — J9622 Acute and chronic respiratory failure with hypercapnia: Secondary | ICD-10-CM | POA: Diagnosis not present

## 2020-08-19 DIAGNOSIS — J9601 Acute respiratory failure with hypoxia: Secondary | ICD-10-CM | POA: Diagnosis present

## 2020-08-19 DIAGNOSIS — Z20822 Contact with and (suspected) exposure to covid-19: Secondary | ICD-10-CM | POA: Diagnosis not present

## 2020-08-19 DIAGNOSIS — R0602 Shortness of breath: Secondary | ICD-10-CM | POA: Diagnosis not present

## 2020-08-19 DIAGNOSIS — R17 Unspecified jaundice: Secondary | ICD-10-CM | POA: Diagnosis not present

## 2020-08-19 DIAGNOSIS — Z87891 Personal history of nicotine dependence: Secondary | ICD-10-CM | POA: Diagnosis not present

## 2020-08-19 DIAGNOSIS — I739 Peripheral vascular disease, unspecified: Secondary | ICD-10-CM | POA: Diagnosis not present

## 2020-08-19 DIAGNOSIS — J9690 Respiratory failure, unspecified, unspecified whether with hypoxia or hypercapnia: Secondary | ICD-10-CM | POA: Diagnosis present

## 2020-08-19 DIAGNOSIS — R519 Headache, unspecified: Secondary | ICD-10-CM | POA: Diagnosis not present

## 2020-08-19 DIAGNOSIS — J9611 Chronic respiratory failure with hypoxia: Secondary | ICD-10-CM | POA: Diagnosis not present

## 2020-08-19 DIAGNOSIS — Z86711 Personal history of pulmonary embolism: Secondary | ICD-10-CM | POA: Diagnosis not present

## 2020-08-19 DIAGNOSIS — E872 Acidosis: Secondary | ICD-10-CM | POA: Diagnosis not present

## 2020-08-19 DIAGNOSIS — J449 Chronic obstructive pulmonary disease, unspecified: Secondary | ICD-10-CM | POA: Diagnosis not present

## 2020-08-19 DIAGNOSIS — Z6841 Body Mass Index (BMI) 40.0 and over, adult: Secondary | ICD-10-CM | POA: Diagnosis not present

## 2020-08-19 DIAGNOSIS — R410 Disorientation, unspecified: Secondary | ICD-10-CM | POA: Diagnosis not present

## 2020-08-19 DIAGNOSIS — D649 Anemia, unspecified: Secondary | ICD-10-CM | POA: Diagnosis not present

## 2020-08-19 DIAGNOSIS — R944 Abnormal results of kidney function studies: Secondary | ICD-10-CM | POA: Diagnosis not present

## 2020-08-19 DIAGNOSIS — I517 Cardiomegaly: Secondary | ICD-10-CM | POA: Diagnosis not present

## 2020-08-19 DIAGNOSIS — D72819 Decreased white blood cell count, unspecified: Secondary | ICD-10-CM | POA: Diagnosis not present

## 2020-08-19 DIAGNOSIS — M351 Other overlap syndromes: Secondary | ICD-10-CM | POA: Diagnosis not present

## 2020-08-19 DIAGNOSIS — K5900 Constipation, unspecified: Secondary | ICD-10-CM | POA: Diagnosis not present

## 2020-08-19 DIAGNOSIS — I825Z2 Chronic embolism and thrombosis of unspecified deep veins of left distal lower extremity: Secondary | ICD-10-CM | POA: Diagnosis not present

## 2020-08-19 DIAGNOSIS — Z9119 Patient's noncompliance with other medical treatment and regimen: Secondary | ICD-10-CM | POA: Diagnosis not present

## 2020-08-19 DIAGNOSIS — Z7901 Long term (current) use of anticoagulants: Secondary | ICD-10-CM | POA: Diagnosis not present

## 2020-08-19 DIAGNOSIS — G4733 Obstructive sleep apnea (adult) (pediatric): Secondary | ICD-10-CM | POA: Diagnosis not present

## 2020-08-19 DIAGNOSIS — J9621 Acute and chronic respiratory failure with hypoxia: Secondary | ICD-10-CM | POA: Diagnosis not present

## 2020-08-19 DIAGNOSIS — Z79899 Other long term (current) drug therapy: Secondary | ICD-10-CM | POA: Diagnosis not present

## 2020-08-19 DIAGNOSIS — Z9989 Dependence on other enabling machines and devices: Secondary | ICD-10-CM | POA: Diagnosis not present

## 2020-08-19 DIAGNOSIS — I10 Essential (primary) hypertension: Secondary | ICD-10-CM | POA: Diagnosis not present

## 2020-08-19 DIAGNOSIS — I2721 Secondary pulmonary arterial hypertension: Secondary | ICD-10-CM | POA: Diagnosis not present

## 2020-08-19 DIAGNOSIS — I2699 Other pulmonary embolism without acute cor pulmonale: Secondary | ICD-10-CM | POA: Diagnosis not present

## 2020-08-19 DIAGNOSIS — E662 Morbid (severe) obesity with alveolar hypoventilation: Secondary | ICD-10-CM | POA: Diagnosis not present

## 2020-08-19 DIAGNOSIS — G9349 Other encephalopathy: Secondary | ICD-10-CM | POA: Diagnosis not present

## 2020-08-19 LAB — BASIC METABOLIC PANEL
Anion gap: 8 (ref 5–15)
BUN: 11 mg/dL (ref 6–20)
CO2: 33 mmol/L — ABNORMAL HIGH (ref 22–32)
Calcium: 9 mg/dL (ref 8.9–10.3)
Chloride: 101 mmol/L (ref 98–111)
Creatinine, Ser: 0.94 mg/dL (ref 0.44–1.00)
GFR, Estimated: >60 mL/min (ref >60)
Glucose, Bld: 93 mg/dL (ref 70–99)
Potassium: 4.8 mmol/L (ref 3.5–5.1)
Sodium: 142 mmol/L (ref 135–145)

## 2020-08-19 LAB — BLOOD GAS, ARTERIAL
Acid-Base Excess: 6.6 mmol/L — ABNORMAL HIGH (ref 0.0–2.0)
Bicarbonate: 36.1 mmol/L — ABNORMAL HIGH (ref 20.0–28.0)
FIO2: 28
O2 Saturation: 97.2 %
Patient temperature: 37
pCO2 arterial: >120 mmHg (ref 32.0–48.0)
pH, Arterial: 7.097 — CL (ref 7.350–7.450)
pO2, Arterial: 117 mmHg — ABNORMAL HIGH (ref 83.0–108.0)

## 2020-08-19 LAB — I-STAT VENOUS BLOOD GAS, ED
Acid-Base Excess: 4 mmol/L — ABNORMAL HIGH (ref 0.0–2.0)
Bicarbonate: 29.1 mmol/L — ABNORMAL HIGH (ref 20.0–28.0)
Calcium, Ion: 0.84 mmol/L — CL (ref 1.15–1.40)
HCT: 47 % — ABNORMAL HIGH (ref 36.0–46.0)
Hemoglobin: 16 g/dL — ABNORMAL HIGH (ref 12.0–15.0)
O2 Saturation: 100 %
Potassium: 4.8 mmol/L (ref 3.5–5.1)
Sodium: 138 mmol/L (ref 135–145)
TCO2: 30 mmol/L (ref 22–32)
pCO2, Ven: 44.2 mmHg (ref 44.0–60.0)
pH, Ven: 7.427 (ref 7.250–7.430)
pO2, Ven: 189 mmHg — ABNORMAL HIGH (ref 32.0–45.0)

## 2020-08-19 LAB — ECHOCARDIOGRAM COMPLETE: S' Lateral: 3.2 cm

## 2020-08-19 LAB — CBG MONITORING, ED: Glucose-Capillary: 96 mg/dL (ref 70–99)

## 2020-08-19 LAB — HIV ANTIBODY (ROUTINE TESTING W REFLEX): HIV Screen 4th Generation wRfx: NONREACTIVE

## 2020-08-19 MED ORDER — ALBUTEROL SULFATE HFA 108 (90 BASE) MCG/ACT IN AERS: 2.0000 | INHALATION_SPRAY | RESPIRATORY_TRACT | Status: DC | PRN

## 2020-08-19 MED ORDER — ACETAMINOPHEN 325 MG PO TABS
650.0000 mg | ORAL_TABLET | Freq: Four times a day (QID) | ORAL | Status: DC | PRN
  Administered 2020-08-19: 650 mg via ORAL
  Filled 2020-08-19: qty 2

## 2020-08-19 MED ORDER — SODIUM CHLORIDE 0.9% FLUSH
3.0000 mL | Freq: Two times a day (BID) | INTRAVENOUS | Status: DC
  Administered 2020-08-19 – 2020-08-25 (×12): 3 mL via INTRAVENOUS

## 2020-08-19 MED ORDER — ONDANSETRON HCL 4 MG/2ML IJ SOLN
4.0000 mg | Freq: Once | INTRAMUSCULAR | Status: AC
  Administered 2020-08-19: 4 mg via INTRAVENOUS
  Filled 2020-08-19: qty 2

## 2020-08-19 MED ORDER — CHLORHEXIDINE GLUCONATE CLOTH 2 % EX PADS
6.0000 | MEDICATED_PAD | Freq: Every day | CUTANEOUS | Status: DC
  Administered 2020-08-19 – 2020-08-22 (×3): 6 via TOPICAL

## 2020-08-19 MED ORDER — LOSARTAN POTASSIUM 50 MG PO TABS: 50.0000 mg | ORAL_TABLET | Freq: Every day | ORAL | Status: DC

## 2020-08-19 MED ORDER — FUROSEMIDE 10 MG/ML IJ SOLN
40.0000 mg | Freq: Four times a day (QID) | INTRAMUSCULAR | Status: AC
Start: 2020-08-19 — End: 2020-08-19
  Administered 2020-08-19 (×2): 40 mg via INTRAVENOUS
  Filled 2020-08-19 (×2): qty 4

## 2020-08-19 MED ORDER — APIXABAN 5 MG PO TABS
5.0000 mg | ORAL_TABLET | Freq: Two times a day (BID) | ORAL | Status: DC
  Administered 2020-08-19 – 2020-08-25 (×12): 5 mg via ORAL
  Filled 2020-08-19 (×12): qty 1

## 2020-08-19 MED ORDER — SODIUM CHLORIDE 0.9% FLUSH: 3.0000 mL | INTRAVENOUS | Status: DC | PRN

## 2020-08-19 MED ORDER — CHLORHEXIDINE GLUCONATE 0.12 % MT SOLN
15.0000 mL | Freq: Two times a day (BID) | OROMUCOSAL | Status: DC
  Administered 2020-08-19 – 2020-08-25 (×9): 15 mL via OROMUCOSAL
  Filled 2020-08-19 (×7): qty 15

## 2020-08-19 MED ORDER — SODIUM CHLORIDE 0.9 % IV SOLN: 250.0000 mL | INTRAVENOUS | Status: DC | PRN

## 2020-08-19 MED ORDER — CHLORHEXIDINE GLUCONATE CLOTH 2 % EX PADS: 6.0000 | MEDICATED_PAD | Freq: Every day | CUTANEOUS | Status: DC

## 2020-08-19 MED ORDER — ACETAMINOPHEN 650 MG RE SUPP: 650.0000 mg | Freq: Four times a day (QID) | RECTAL | Status: DC | PRN

## 2020-08-19 MED ORDER — CALCIUM GLUCONATE-NACL 1-0.675 GM/50ML-% IV SOLN
1.0000 g | Freq: Once | INTRAVENOUS | Status: AC
  Administered 2020-08-19: 1000 mg via INTRAVENOUS
  Filled 2020-08-19: qty 50

## 2020-08-19 MED ORDER — LOSARTAN POTASSIUM 50 MG PO TABS
50.0000 mg | ORAL_TABLET | Freq: Every day | ORAL | Status: DC
  Administered 2020-08-19: 50 mg via ORAL
  Filled 2020-08-19 (×2): qty 1

## 2020-08-19 MED ORDER — ORAL CARE MOUTH RINSE
15.0000 mL | Freq: Two times a day (BID) | OROMUCOSAL | Status: DC
  Administered 2020-08-20 – 2020-08-25 (×7): 15 mL via OROMUCOSAL

## 2020-08-19 NOTE — ED Provider Notes (Addendum)
11:18 AM I was called to the bedside because of altered mental status.  Quick chart review shows that the patient is admitted for dyspnea.  The nurse tells me that when the patient awoke at around 9 and this nurse assessed her, she was having trouble with words and seemed a [place] confused like she had just woken up.  Now she is not able to perform significant words and seems more confused.  Unfortunately based on her exam and chart review I cannot determine a "last seen normal".  Thus I would not call code stroke but we will get CBG, ABG, CT head stat.  She is awake and alert and not hypoxic.  However she does not really follow commands though she seemed to move both upper extremities when I move them.  12:12 PM Glucose and head CT are benign.  Her ABG however shows severe respiratory acidosis with pH of 7.1 and CO2 above the upper limit of 96.  Will place on BiPAP.  Updated hospitalist team.  CRITICAL CARE Performed by: Ephraim Hamburger   Total critical care time: 30 minutes  Critical care time was exclusive of separately billable procedures and treating other patients.  Critical care was necessary to treat or prevent imminent or life-threatening deterioration.  Critical care was time spent personally by me on the following activities: development of treatment plan with patient and/or surrogate as well as nursing, discussions with consultants, evaluation of patient's response to treatment, examination of patient, obtaining history from patient or surrogate, ordering and performing treatments and interventions, ordering and review of laboratory studies, ordering and review of radiographic studies, pulse oximetry and re-evaluation of patient's condition.    Sherwood Gambler, MD 08/19/20 [phone]

## 2020-08-19 NOTE — ED Notes (Signed)
Pt resting comfortably. BiPAP on. Niece at bedside.

## 2020-08-19 NOTE — ED Notes (Signed)
Upon talking to family, she verified that pt is oriented x 4 and ambulatory at baseline. Notified ED doctor who ordered CT STAT, ABGs and blood sugar check.

## 2020-08-19 NOTE — ED Notes (Signed)
MS Breakfast Ordered

## 2020-08-19 NOTE — ED Notes (Signed)
Lunch Tray Ordered @ 1105. 

## 2020-08-19 NOTE — ED Notes (Signed)
Pt is now oriented to self, speaks clear and answers questions appropriately.

## 2020-08-19 NOTE — ED Notes (Signed)
Lunch Tray Ordered @ 9485.

## 2020-08-19 NOTE — ED Notes (Signed)
Pt finally woke up from sleep. She does seem confuse. Notified Dr. Marthenia Rolling. Pt goes back to sleep right away.

## 2020-08-19 NOTE — Consult Note (Signed)
NAMEJoniyah Brewer, MRN:  [id number], DOB:  [birth year], LOS: 0 ADMISSION DATE:  08/18/2020, CONSULTATION DATE:  08/19/20 REFERRING MD:  TRH, CHIEF COMPLAINT:  SOB   Brief History   55 year old woman with end stage sequelae of obesity, prior VTE presenting with acute on chronic hypercarbic and hypoxemic respiratory failure.  History of present illness   55 year old woman with a history of provoked VTE, class 3 obesity, OSA with intermittent CPAP compliace presenting with acute on chronic SOB.  Workup in ER showed hypoxemia, CTA chest benign.  Developed progressive lethargy in ER so code stroke called, ABG showed severe acute on chronic hypercarbia, PCCM consulted.  Daughter at bedside provides most of history.  Patient's CPAP mask has not been fitting her well so she has not been wearing, trying to get in to DME for mask refitting but having issues with this. Has chronic DOE, orthopnea, and LE swelling.  Placed on BIPAP with some improvement in mentation, requires a good deal of pressure to get any appropriate volumes  Past Medical History  OSA Groups 2/3 pulm HTN Prior VTE in 2017 on lifelong [facility]   2/5 admitted 2/6 code stroke, PCCM consult  Consults:  n/a  Procedures:  n/a  Significant Diagnostic Tests:  Echo pending CT head and chest nothing acute  Micro Data:  COVID neg  Antimicrobials:  none   Interim history/subjective:  Consulted  Objective   Blood pressure 127/77, pulse 97, temperature 98.7 F (37.1 C), temperature source Oral, resp. rate 16, last menstrual period 11/29/2015, SpO2 100 %.       No intake or output data in the 24 hours ending 08/19/20 1335 There were no vitals filed for this visit.  Examination: Constitutional: somnolent obese woman on BIPAP  Eyes: pupils equal, reactive Ears, nose, mouth, and throat: bipap in place with fair seal Cardiovascular: distant, ext warm Respiratory: distant, no wheezing or accessory  muscle use Gastrointestinal: soft, +BS Skin: No rashes, normal turgor Neurologic: moves all 4 ext to command with repeated prompting Psychiatric: answers questions appropriately then nods off to sleep  ABG 7.09/>120/117 on 4L [place] (on RA at home)  Resolved Hospital Problem list   n/a  Assessment & Plan:  Acute on chronic hypercarbia in setting of OHS/OSA overlap syndrome (AHI 98.6 requiring BIPAP for adequate control), groups 2/3 pulmonary HTN, (question of group 4 w/ her chronic LLE DVT) and poor outpatient BIPAP compliance.  Perhaps worsened by new supplemental O2 reducing respiratory drive. Acute hypercarbic encephalopathy  - BIPAP adjusted by ER RT, appreciate help - Would not get another ABG unless she becomes unarousable - There is no role for MRI, her exam is nonfocal and c/w CO2 retention - Trial of lasix - ICU admission for now, if she turns around quickly can go to progressive - Last seen by Joya Gaskins 02/09/20, will send a message to him and see if he can arrange DME mask refitting or would like patient to transition care to VS/AO/RA).  Really needs another OPO and/or CPAP titration study.  Best practice:  Diet: NPO Pain/Anxiety/Delirium protocol (if indicated): none VAP protocol (if indicated): n/a DVT prophylaxis: eliquis if can take PO, no urgent need to resume this GI prophylaxis: n/a Glucose control: n/a Mobility: BR Code Status: full Family Communication: updated daughter at bedside Disposition: ICU pending improement    Medical Decision Making    Diagnoses that are immediately life threatening include hypercarbic encephalopathy, acute on chronic hypercarbic respiratory  failure Critical test findings: ABG above Interventions today to address these diagnoses are BIPAP, diuresis trial Likelihood of life-threatening deterioration without intervention is high.  Labs   CBC: Recent Labs  Lab 08/18/20 0246 08/19/20 0323  WBC 4.6  --   HGB 13.0 16.0*  HCT 41.9  47.0*  MCV 95.7  --   PLT 226  --     Basic Metabolic Panel: Recent Labs  Lab 08/18/20 0246 08/19/20 0323 08/19/20 0439  NA 141 138 142  K 4.2 4.8 4.8  CL 101  --  101  CO2 30  --  33*  GLUCOSE 78  --  93  BUN 15  --  11  CREATININE 1.02*  --  0.94  CALCIUM 9.0  --  9.0   GFR: CrCl cannot be calculated (Unknown ideal weight.). Recent Labs  Lab 08/18/20 0246  WBC 4.6    Liver Function Tests: No results for input(s): AST, ALT, ALKPHOS, BILITOT, PROT, ALBUMIN in the last 168 hours. No results for input(s): LIPASE, AMYLASE in the last 168 hours. No results for input(s): AMMONIA in the last 168 hours.  ABG    Component Value Date/Time   PHART 7.097 (LL) 08/19/2020 1203   PCO2ART >120.0 (HH) 08/19/2020 1203   PO2ART 117 (H) 08/19/2020 1203   HCO3 36.1 (H) 08/19/2020 1203   TCO2 30 08/19/2020 0323   ACIDBASEDEF 3.0 (H) 11/22/2019 1119   O2SAT 97.2 08/19/2020 1203     Coagulation Profile: No results for input(s): INR, PROTIME in the last 168 hours.  Cardiac Enzymes: No results for input(s): CKTOTAL, CKMB, CKMBINDEX, TROPONINI in the last 168 hours.  HbA1C: Hemoglobin A1C  Date/Time Value Ref Range Status  06/10/2016 04:41 PM 5.4  Final    CBG: Recent Labs  Lab 08/19/20 1117  GLUCAP 96    Review of Systems:   Cannot assess due to degree of somnolence  Past Medical History  She,  has a past medical history of DVT (deep vein thrombosis) in pregnancy, Dyspnea, Morbid obesity ([place]), PE (pulmonary embolism) (02/29/2016), Peripheral vascular disease ([place]), and Sleep apnea.   Surgical History    Past Surgical History:  Procedure Laterality Date  . CESAREAN SECTION     x 3  . OPEN REDUCTION INTERNAL FIXATION (ORIF) DISTAL RADIAL FRACTURE Right 06/20/2019   Procedure: OPEN REDUCTION INTERNAL FIXATION (ORIF) DISTAL RADIAL FRACTURE;  Surgeon: [name], MD;  Location: [facility];  Service: Orthopedics;  Laterality: Right;  REGIONAL BLOCK TOTAL SURGERY TIME: 90  MINUTES  . SYNOVECTOMY Right 11/21/2019   Procedure: RIGHT WRIST LIGAMENT RECONSTRUCTION;  Surgeon: [name], MD;  Location: [facility];  Service: Orthopedics;  Laterality: Right;  PROCEDURE: RIGHT WRIST LIGAMENT RECONSTRUCTION LENGTH OF SURGERY: 105 MIN     Social History   reports that she has quit smoking. She has never used smokeless tobacco. She reports that she does not drink alcohol and does not use drugs.   Family History   Her family history is not on file.   Allergies No Known Allergies   Home Medications  Prior to Admission medications   Medication Sig Start Date End Date Taking? Authorizing Provider  acetaminophen (TYLENOL) 325 MG tablet Take 2 tablets (650 mg total) by mouth every 6 (six) hours. Patient taking differently: Take 650 mg by mouth every 6 (six) hours as needed for mild pain or headache. 11/21/19  Yes [name], MD  apixaban (ELIQUIS) 5 MG TABS tablet Take 1 tablet (5 mg total) by mouth 2 (  two) times daily. 08/18/20 12/16/20 Yes Garald Balding, PA-C  Blood Pressure Monitor DEVI Take blood pressure daily 02/09/20  Yes Elsie Stain, MD  ibuprofen (ADVIL) 200 MG tablet Take 600 mg by mouth every 6 (six) hours as needed for headache or moderate pain.   Yes [provider]  PRESCRIPTION MEDICATION CPAP- At bedtime   Yes [provider]     Critical care time: 34 minutes not including any separately billable procedures

## 2020-08-19 NOTE — Progress Notes (Addendum)
eLink Physician-Brief Progress Note Patient Name: Matsue Radcliffe DOB: 09-05-1965 MRN: [id number]   Date of Service  08/19/2020  HPI/Events of Note  NPE done. Bedside PCCM note reviewed. Patient answering questions, no focal signs and no issues tolerating BIPAP. Has o2 sat of 100 on 40% fio2, HR in 60s and SBP in 120s.   eICU Interventions  Lower the Fio2 , does not need to have O2 sat of 100 - goal O2 sat would be around 89-90  AM lab and AM abg ordered Check ABG sooner if her condition changes Rest per bedside notes     Intervention Category Major Interventions: Respiratory failure - evaluation and management Evaluation Type: New Patient Evaluation  Margaretmary Lombard 08/19/2020, 10:44 PM   1 am -  Glucose 67, given D50, up to 89 but not eating while on BIPAP Add D10 Q2h glucose checks  3:45 am Glucose is 65, given D50 Double the rate of D10  5 30 am  I was notified 30 min ago that ABG was done and Ph 7,2 and pco2 92, much improved from before Results not in Epic at this time Continue bipap

## 2020-08-19 NOTE — Progress Notes (Signed)
Spoke with PW who requests [facility] Sleep medicine f/u.

## 2020-08-19 NOTE — Progress Notes (Signed)
PROGRESS NOTE    Angel Brewer  WGN:[id number] DOB: Nov 19, 1965 DOA: 08/18/2020 PCP: [name], NP  Outpatient Specialists:   Brief Narrative:  Patient is a 55 year old African-American female, morbidly obese, past medical history significant for DVT, PE, OSA on CPAP and likely noncompliance.  Patient was admitted with worsening shortness of breath.  Apparently, patient has had progressive worsening of shortness of breath.  On presentation to the hospital, O2 sat was 84 to 88%.  CTA chest was negative for significant pulmonary embolism, but revealed pulmonary hypertension.  During the hospital stay, patient became significantly short of breath, with lethargy.  He done revealed pH of 7.097 and PCO2 that was greater than 120 with PO2 of 117.  Patient put on BiPAP, and critical care team consulted.  Critical care team has assumed patient's care.   Assessment & Plan:   Principal Problem:   Chronic respiratory failure with hypoxia (HCC) Active Problems:   Morbid obesity ([place])   Essential hypertension   OSA on CPAP   Pulmonary hypertension (HCC)   Respiratory failure ([place])  Acute on chronic respiratory failure with combined hypercapnia and hypoxia: -See above documentation. -Patient is currently on BiPAP. -ABG done is as documented above. -Patient remains in significant respiratory distress. -Critical care team will assume care.  OSA on CPAP: -Patient is on home CPAP. -Discussed with patient's care (about possibly patient's niece). -Patient may not be very compliant. -Suspect component of obesity hypoventilation syndrome as well.    Pulmonary hypertension: -Likely secondary to above. -We defer further management to the pulmonary team.   Morbid obesity: -Further management on discharge.  History of DVT/PE: Resume anticoagulation with eliquis.    DVT prophylaxis: Eliquis. Code Status: Code Family Communication: Patient's niece Disposition Plan: ICU team will assume  care   Consultants:   Critical care team.  Procedures:   Patient is currently on BiPAP  Antimicrobials:   None   Subjective: No history from patient as patient is currently on BiPAP.  Objective: Vitals:   08/19/20 1600 08/19/20 1615 08/19/20 1630 08/19/20 1715  BP: 126/85 123/87 129/84 139/85  Pulse: 74  74 67  Resp: 19 (!) 22 (!) 22 (!) 22  Temp:      TempSrc:      SpO2: 92% 94% 90% 97%    Intake/Output Summary (Last 24 hours) at 08/19/2020 1837 Last data filed at 08/19/2020 1559 Gross per 24 hour  Intake --  Output 300 ml  Net -300 ml   There were no vitals filed for this visit.  Examination:  General exam: Patient respiratory distress.  Patient is morbidly obese.  Patient is currently on BiPAP.   Respiratory system: Decreased air entry globally.   Cardiovascular system: S1 & S2  Gastrointestinal system: Abdomen is morbidly obese, soft and nontender.  Organs are difficult to assess.   Central nervous system: Lethargic.   Extremities: Leg edema.  Data Reviewed: I have personally reviewed following labs and imaging studies  CBC: Recent Labs  Lab 08/18/20 0246 08/19/20 0323  WBC 4.6  --   HGB 13.0 16.0*  HCT 41.9 47.0*  MCV 95.7  --   PLT 226  --    Basic Metabolic Panel: Recent Labs  Lab 08/18/20 0246 08/19/20 0323 08/19/20 0439  NA 141 138 142  K 4.2 4.8 4.8  CL 101  --  101  CO2 30  --  33*  GLUCOSE 78  --  93  BUN 15  --  11  CREATININE 1.02*  --  0.94  CALCIUM 9.0  --  9.0   GFR: CrCl cannot be calculated (Unknown ideal weight.). Liver Function Tests: No results for input(s): AST, ALT, ALKPHOS, BILITOT, PROT, ALBUMIN in the last 168 hours. No results for input(s): LIPASE, AMYLASE in the last 168 hours. No results for input(s): AMMONIA in the last 168 hours. Coagulation Profile: No results for input(s): INR, PROTIME in the last 168 hours. Cardiac Enzymes: No results for input(s): CKTOTAL, CKMB, CKMBINDEX, TROPONINI in the last 168  hours. BNP (last 3 results) No results for input(s): PROBNP in the last 8760 hours. HbA1C: No results for input(s): HGBA1C in the last 72 hours. CBG: Recent Labs  Lab 08/19/20 1117  GLUCAP 96   Lipid Profile: No results for input(s): CHOL, HDL, LDLCALC, TRIG, CHOLHDL, LDLDIRECT in the last 72 hours. Thyroid Function Tests: No results for input(s): TSH, T4TOTAL, FREET4, T3FREE, THYROIDAB in the last 72 hours. Anemia Panel: No results for input(s): VITAMINB12, FOLATE, FERRITIN, TIBC, IRON, RETICCTPCT in the last 72 hours. Urine analysis:    Component Value Date/Time   COLORURINE AMBER (A) 11/23/2019 0220   APPEARANCEUR CLOUDY (A) 11/23/2019 0220   LABSPEC 1.027 11/23/2019 0220   PHURINE 5.0 11/23/2019 0220   GLUCOSEU NEGATIVE 11/23/2019 0220   HGBUR NEGATIVE 11/23/2019 0220   BILIRUBINUR NEGATIVE 11/23/2019 0220   KETONESUR NEGATIVE 11/23/2019 0220   PROTEINUR 100 (A) 11/23/2019 0220   NITRITE NEGATIVE 11/23/2019 0220   LEUKOCYTESUR NEGATIVE 11/23/2019 0220   Sepsis Labs: _0 (procalcitonin:4,lacticidven:4)  ) Recent Results (from the past 240 hour(s))  SARS Coronavirus 2 by RT PCR (hospital order, performed in [place] hospital lab) Nasopharyngeal Nasopharyngeal Swab     Status: None   Collection Time: 08/18/20  6:21 PM   Specimen: Nasopharyngeal Swab  Result Value Ref Range Status   SARS Coronavirus 2 NEGATIVE NEGATIVE Final    Comment: (NOTE) SARS-CoV-2 target nucleic acids are NOT DETECTED.  The SARS-CoV-2 RNA is generally detectable in upper and lower respiratory specimens during the acute phase of infection. The lowest concentration of SARS-CoV-2 viral copies this assay can detect is 250 copies / mL. A negative result does not preclude SARS-CoV-2 infection and should not be used as the sole basis for treatment or other patient management decisions.  A negative result may occur with improper specimen collection / handling, submission of specimen  other than nasopharyngeal swab, presence of viral mutation(s) within the areas targeted by this assay, and inadequate number of viral copies (<250 copies / mL). A negative result must be combined with clinical observations, patient history, and epidemiological information.  Fact Sheet for Patients:   StrictlyIdeas.no  Fact Sheet for Healthcare Providers: BankingDealers.co.za  This test is not yet approved or  cleared by the [place] FDA and has been authorized for detection and/or diagnosis of SARS-CoV-2 by FDA under an Emergency Use Authorization (EUA).  This EUA will remain in effect (meaning this test can be used) for the duration of the COVID-19 declaration under Section 564(b)(1) of the Act, 21 U.S.C. section 360bbb-3(b)(1), unless the authorization is terminated or revoked sooner.  Performed at [facility], [address], [place], [place] [address]          Radiology Studies: DG Chest 2 View  Result Date: 08/18/2020 CLINICAL DATA:  Shortness of breath EXAM: CHEST - 2 VIEW COMPARISON:  11/23/2019 FINDINGS: Cardiomegaly, vascular congestion. No overt edema. Lingular atelectasis or scarring. No effusions or acute bony abnormality. IMPRESSION: Cardiomegaly, vascular congestion. Electronically Signed  By: Rolm Baptise M.D.   On: 08/18/2020 03:12   CT Head Wo Contrast  Result Date: 08/19/2020 CLINICAL DATA:  Headache, intracranial hemorrhage suspected. Confusion. EXAM: CT HEAD WITHOUT CONTRAST TECHNIQUE: Contiguous axial images were obtained from the base of the skull through the vertex without intravenous contrast. COMPARISON:  None. FINDINGS: Brain: No acute infarct, hemorrhage, or mass lesion is present. The ventricles are of normal size. No significant extraaxial fluid collection is present. The brainstem and cerebellum are within normal limits. Vascular: No hyperdense vessel or unexpected calcification. Skull: No  significant extracranial soft tissue lesion is present. Calvarium is intact. No focal lytic or blastic lesions are present. Sinuses/Orbits: The paranasal sinuses and mastoid air cells are clear. The globes and orbits are within normal limits. IMPRESSION: Negative CT of the head. Electronically Signed   By: San Morelle M.D.   On: 08/19/2020 11:42   CT Angio Chest PE W and/or Wo Contrast  Result Date: 08/18/2020 CLINICAL DATA:  Shortness of breath. Difficulty sleeping. COPD. Pulmonary emboli suspected. EXAM: CT ANGIOGRAPHY CHEST WITH CONTRAST TECHNIQUE: Multidetector CT imaging of the chest was performed using the standard protocol during bolus administration of intravenous contrast. Multiplanar CT image reconstructions and MIPs were obtained to evaluate the vascular anatomy. CONTRAST:  34m OMNIPAQUE IOHEXOL 350 MG/ML SOLN COMPARISON:  Chest radiography same day. Previous CT angiography 06/13/2019. previous CT 02/29/2016 FINDINGS: Cardiovascular: The heart is enlarged. No pericardial effusion. No visible coronary artery calcification. Some aortic atherosclerotic calcification is present. Pulmonary arteries are prominent consistent with pulmonary arterial hypertension. Pulmonary arterial opacification is moderate. No pulmonary emboli are seen. Small distal emboli might be inapparent. No large or central emboli. Mediastinum/Nodes: Chronic prominent nodes in the right hilum, subcarinal region and right paratracheal region. These have not changed or worsened since 2017. Lungs/Pleura: No pleural effusion. Mosaic attenuation pattern of the lungs as seen chronically. Seen in association with adenopathy, sarcoid could possibly give this appearance. Upper Abdomen: Negative Musculoskeletal: Ordinary thoracic degenerative changes. Review of the MIP images confirms the above findings. IMPRESSION: 1. No large or central pulmonary emboli. Pulmonary arterial opacification is only moderate. Small distal emboli might be  inapparent. 2. Cardiomegaly. Enlarged pulmonary arteries consistent with pulmonary arterial hypertension. 3. Chronic enlargement of right hilar, subcarinal and paratracheal lymph nodes. 4. Chronic mosaic attenuation pattern of the lungs as seen chronically. Seen in association with adenopathy, sarcoid could possibly give this appearance. 5. Aortic atherosclerosis. Aortic Atherosclerosis (ICD10-I70.0). Electronically Signed   By: MNelson ChimesM.D.   On: 08/18/2020 20:39   ECHOCARDIOGRAM COMPLETE  Result Date: 08/19/2020    ECHOCARDIOGRAM REPORT   Patient Name:   Angel Brewer Date of Exam: 08/19/2020 Medical Rec #:  [id number]     Height:       68.0 in Accession #:    [id number]    Weight:       403.0 lb Date of Birth:  104/13/1967     BSA:          2.753 m Patient Age:    55years       BP:           179/101 mmHg Patient Gender: F              HR:           101 bpm. Exam Location:  Inpatient Procedure: 2D Echo, Cardiac Doppler and Color Doppler Indications:    I27.20 Pulmonary Hypertension  History:  Patient has prior history of Echocardiogram examinations, most                 recent 11/23/2019. Risk Factors:Morbidly Obese. Stroke like                 symptoms during echo. Patient taken emergently to CT.  Sonographer:    Merrie Roof RDCS Referring Phys: [id number] [facility]  Sonographer Comments: Technically difficult study due to poor echo windows and patient is morbidly obese. Image acquisition challenging due to patient body habitus. IMPRESSIONS  1. Left ventricular ejection fraction, by estimation, is 60 to 65%. The left ventricle has normal function. The left ventricle has no regional wall motion abnormalities. There is mild left ventricular hypertrophy. Left ventricular diastolic function could not be evaluated.  2. Right ventricular systolic function was not well visualized. The right ventricular size is not well visualized.  3. The mitral valve was not assessed. No evidence of mitral valve  regurgitation.  4. The aortic valve was not well visualized. Aortic valve regurgitation is not visualized. Comparison(s): Incomplete echo - patient emergently during the echo to CT for possible stroke. Echo windows are poor- consider limited follow-up echo with Definity contrast if necessary. FINDINGS  Left Ventricle: Left ventricular ejection fraction, by estimation, is 60 to 65%. The left ventricle has normal function. The left ventricle has no regional wall motion abnormalities. The left ventricular internal cavity size was normal in size. There is  mild left ventricular hypertrophy. Left ventricular diastolic function could not be evaluated due to indeterminate diastolic function. Left ventricular diastolic function could not be evaluated. Right Ventricle: The right ventricular size is not well visualized. Right vetricular wall thickness was not assessed. Right ventricular systolic function was not well visualized. Left Atrium: Left atrial size was normal in size. Right Atrium: Right atrial size was normal in size. Pericardium: There is no evidence of pericardial effusion. Mitral Valve: The mitral valve was not assessed. No evidence of mitral valve regurgitation. Tricuspid Valve: The tricuspid valve is not assessed. Tricuspid valve regurgitation is not demonstrated. Aortic Valve: The aortic valve was not well visualized. Aortic valve regurgitation is not visualized. Pulmonic Valve: The pulmonic valve was grossly normal. Pulmonic valve regurgitation is trivial. Aorta: The aortic root was not well visualized. IAS/Shunts: No atrial level shunt detected by color flow Doppler.  LEFT VENTRICLE PLAX 2D LVIDd:         4.30 cm LVIDs:         3.20 cm LV PW:         1.20 cm LV IVS:        1.30 cm LVOT diam:     2.60 cm LVOT Area:     5.31 cm  LEFT ATRIUM           Index LA diam:      3.50 cm 1.27 cm/m LA Vol (A4C): 67.6 ml 24.56 ml/m   AORTA Ao Root diam: 3.40 cm  SHUNTS Systemic Diam: 2.60 cm Lyman Bishop MD  Electronically signed by Lyman Bishop MD Signature Date/Time: 08/19/2020/12:59:19 PM    Final         Scheduled Meds: . apixaban  5 mg Oral BID  . furosemide  40 mg Intravenous Q6H  . losartan  50 mg Oral Daily  . sodium chloride flush  3 mL Intravenous Q12H   Continuous Infusions: . sodium chloride       LOS: 0 days    Time spent: 35 minutes  Dana Allan, MD  [facility] Pager #: [phone] 7PM-7AM contact night coverage as above

## 2020-08-19 NOTE — ED Notes (Signed)
Pt placed on BiPAP by RT

## 2020-08-19 NOTE — ED Notes (Signed)
Pt unable to express words, unable to follow commands and seems confused. Dr. Marthenia Rolling notified.

## 2020-08-19 NOTE — ED Notes (Signed)
Patient transported to CT 

## 2020-08-19 NOTE — ED Notes (Signed)
Pt woke up and was more oriented. Pt was able to recognize her niece. Pt said she is always sleepy. Words were clear and appropriate.

## 2020-08-19 NOTE — Progress Notes (Signed)
Pt transported to 107mwith no complications.

## 2020-08-19 NOTE — Progress Notes (Signed)
  Echocardiogram 2D Echocardiogram has been performed.  Merrie Roof F 08/19/2020, 11:26 AM

## 2020-08-20 DIAGNOSIS — I272 Pulmonary hypertension, unspecified: Secondary | ICD-10-CM | POA: Diagnosis not present

## 2020-08-20 DIAGNOSIS — J9622 Acute and chronic respiratory failure with hypercapnia: Secondary | ICD-10-CM | POA: Diagnosis not present

## 2020-08-20 DIAGNOSIS — Z9989 Dependence on other enabling machines and devices: Secondary | ICD-10-CM | POA: Diagnosis not present

## 2020-08-20 DIAGNOSIS — I10 Essential (primary) hypertension: Secondary | ICD-10-CM | POA: Diagnosis not present

## 2020-08-20 DIAGNOSIS — J9611 Chronic respiratory failure with hypoxia: Secondary | ICD-10-CM | POA: Diagnosis not present

## 2020-08-20 DIAGNOSIS — J9621 Acute and chronic respiratory failure with hypoxia: Secondary | ICD-10-CM | POA: Diagnosis not present

## 2020-08-20 DIAGNOSIS — G4733 Obstructive sleep apnea (adult) (pediatric): Secondary | ICD-10-CM | POA: Diagnosis not present

## 2020-08-20 LAB — GLUCOSE, CAPILLARY
Glucose-Capillary: 113 mg/dL — ABNORMAL HIGH (ref 70–99)
Glucose-Capillary: 127 mg/dL — ABNORMAL HIGH (ref 70–99)
Glucose-Capillary: 186 mg/dL — ABNORMAL HIGH (ref 70–99)
Glucose-Capillary: 62 mg/dL — ABNORMAL LOW (ref 70–99)
Glucose-Capillary: 65 mg/dL — ABNORMAL LOW (ref 70–99)
Glucose-Capillary: 67 mg/dL — ABNORMAL LOW (ref 70–99)
Glucose-Capillary: 71 mg/dL (ref 70–99)
Glucose-Capillary: 77 mg/dL (ref 70–99)
Glucose-Capillary: 77 mg/dL (ref 70–99)
Glucose-Capillary: 82 mg/dL (ref 70–99)
Glucose-Capillary: 82 mg/dL (ref 70–99)
Glucose-Capillary: 86 mg/dL (ref 70–99)
Glucose-Capillary: 88 mg/dL (ref 70–99)
Glucose-Capillary: 88 mg/dL (ref 70–99)
Glucose-Capillary: 89 mg/dL (ref 70–99)
Glucose-Capillary: 91 mg/dL (ref 70–99)
Glucose-Capillary: 93 mg/dL (ref 70–99)

## 2020-08-20 LAB — CBC WITH DIFFERENTIAL/PLATELET
Abs Immature Granulocytes: 0.04 10*3/uL (ref 0.00–0.07)
Basophils Absolute: 0 10*3/uL (ref 0.0–0.1)
Basophils Relative: 0 %
Eosinophils Absolute: 0 10*3/uL (ref 0.0–0.5)
Eosinophils Relative: 0 %
HCT: 37.7 % (ref 36.0–46.0)
Hemoglobin: 10.9 g/dL — ABNORMAL LOW (ref 12.0–15.0)
Immature Granulocytes: 1 %
Lymphocytes Relative: 16 %
Lymphs Abs: 0.7 10*3/uL (ref 0.7–4.0)
MCH: 28.9 pg (ref 26.0–34.0)
MCHC: 28.9 g/dL — ABNORMAL LOW (ref 30.0–36.0)
MCV: 100 fL (ref 80.0–100.0)
Monocytes Absolute: 0.6 10*3/uL (ref 0.1–1.0)
Monocytes Relative: 13 %
Neutro Abs: 3.3 10*3/uL (ref 1.7–7.7)
Neutrophils Relative %: 70 %
Platelets: 161 10*3/uL (ref 150–400)
RBC: 3.77 MIL/uL — ABNORMAL LOW (ref 3.87–5.11)
RDW: 13.5 % (ref 11.5–15.5)
WBC: 4.6 10*3/uL (ref 4.0–10.5)
nRBC: 0 % (ref 0.0–0.2)

## 2020-08-20 LAB — POCT I-STAT EG7
Acid-Base Excess: 13 mmol/L — ABNORMAL HIGH (ref 0.0–2.0)
Bicarbonate: 43.4 mmol/L — ABNORMAL HIGH (ref 20.0–28.0)
Calcium, Ion: 1.28 mmol/L (ref 1.15–1.40)
HCT: 37 % (ref 36.0–46.0)
Hemoglobin: 12.6 g/dL (ref 12.0–15.0)
O2 Saturation: 83 %
Patient temperature: 99.1
Potassium: 4.5 mmol/L (ref 3.5–5.1)
Sodium: 142 mmol/L (ref 135–145)
TCO2: 46 mmol/L — ABNORMAL HIGH (ref 22–32)
pCO2, Ven: 93.4 mmHg (ref 44.0–60.0)
pH, Ven: 7.277 (ref 7.250–7.430)
pO2, Ven: 59 mmHg — ABNORMAL HIGH (ref 32.0–45.0)

## 2020-08-20 LAB — BASIC METABOLIC PANEL
Anion gap: 11 (ref 5–15)
BUN: 14 mg/dL (ref 6–20)
CO2: 31 mmol/L (ref 22–32)
Calcium: 8.8 mg/dL — ABNORMAL LOW (ref 8.9–10.3)
Chloride: 98 mmol/L (ref 98–111)
Creatinine, Ser: 0.93 mg/dL (ref 0.44–1.00)
GFR, Estimated: >60 mL/min (ref >60)
Glucose, Bld: 207 mg/dL — ABNORMAL HIGH (ref 70–99)
Potassium: 4.4 mmol/L (ref 3.5–5.1)
Sodium: 140 mmol/L (ref 135–145)

## 2020-08-20 LAB — POCT I-STAT 7, (LYTES, BLD GAS, ICA,H+H)
Acid-Base Excess: 12 mmol/L — ABNORMAL HIGH (ref 0.0–2.0)
Bicarbonate: 42.2 mmol/L — ABNORMAL HIGH (ref 20.0–28.0)
Calcium, Ion: 1.28 mmol/L (ref 1.15–1.40)
HCT: 34 % — ABNORMAL LOW (ref 36.0–46.0)
Hemoglobin: 11.6 g/dL — ABNORMAL LOW (ref 12.0–15.0)
O2 Saturation: 92 %
Patient temperature: 97.6
Potassium: 4.3 mmol/L (ref 3.5–5.1)
Sodium: 140 mmol/L (ref 135–145)
TCO2: 45 mmol/L — ABNORMAL HIGH (ref 22–32)
pCO2 arterial: 86.7 mmHg (ref 32.0–48.0)
pH, Arterial: 7.293 — ABNORMAL LOW (ref 7.350–7.450)
pO2, Arterial: 73 mmHg — ABNORMAL LOW (ref 83.0–108.0)

## 2020-08-20 LAB — MRSA PCR SCREENING: MRSA by PCR: NEGATIVE

## 2020-08-20 MED ORDER — DOCUSATE SODIUM 100 MG PO CAPS
100.0000 mg | ORAL_CAPSULE | Freq: Two times a day (BID) | ORAL | Status: DC
  Administered 2020-08-21 (×2): 100 mg via ORAL
  Filled 2020-08-20 (×2): qty 1

## 2020-08-20 MED ORDER — FUROSEMIDE 10 MG/ML IJ SOLN
40.0000 mg | Freq: Four times a day (QID) | INTRAMUSCULAR | Status: AC
  Administered 2020-08-20: 40 mg via INTRAVENOUS
  Filled 2020-08-20: qty 4

## 2020-08-20 MED ORDER — FUROSEMIDE 10 MG/ML IJ SOLN
INTRAMUSCULAR | Status: AC
  Administered 2020-08-20: 40 mg via INTRAVENOUS
  Filled 2020-08-20: qty 4

## 2020-08-20 MED ORDER — DEXTROSE 50 % IV SOLN: 50.0000 mL | Freq: Once | INTRAVENOUS | Status: AC

## 2020-08-20 MED ORDER — FUROSEMIDE 10 MG/ML IJ SOLN
INTRAMUSCULAR | Status: AC
  Filled 2020-08-20: qty 2

## 2020-08-20 MED ORDER — POLYETHYLENE GLYCOL 3350 17 G PO PACK
17.0000 g | PACK | Freq: Every day | ORAL | Status: DC
  Filled 2020-08-20 (×2): qty 1

## 2020-08-20 MED ORDER — DEXTROSE 50 % IV SOLN: 1.0000 | Freq: Once | INTRAVENOUS | Status: AC

## 2020-08-20 MED ORDER — DEXTROSE 10 % IV SOLN: INTRAVENOUS | Status: DC

## 2020-08-20 MED ORDER — DEXTROSE 50 % IV SOLN
INTRAVENOUS | Status: AC
  Administered 2020-08-20: 50 mL via INTRAVENOUS
  Filled 2020-08-20: qty 50

## 2020-08-20 NOTE — Progress Notes (Signed)
Hypoglycemic Event  CBG: 67  Treatment: D50 50 mL (25 gm)  Symptoms: None  Follow-up CBG: Time:0046 CBG Result:89  Possible Reasons for Event: Other: Patient NPO and on BIPAP  Comments/MD notified:MD notified. D10 infusion ordered    Angel Brewer A Braeton Wolgamott

## 2020-08-20 NOTE — Consult Note (Signed)
NAMEPersia Brewer, MRN:  [id number], DOB:  09-Apr-1966, LOS: 1 ADMISSION DATE:  08/18/2020, CONSULTATION DATE:  08/19/20 REFERRING MD:  TRH, CHIEF COMPLAINT:  SOB   Brief History   55 year old woman with end stage sequelae of obesity, prior VTE presenting with acute on chronic hypercarbic and hypoxemic respiratory failure.  History of present illness   55 year old woman with a history of provoked VTE, class 3 obesity, OSA with intermittent CPAP compliace presenting with acute on chronic SOB.  Workup in ER showed hypoxemia, CTA chest benign.  Developed progressive lethargy in ER so code stroke called, ABG showed severe acute on chronic hypercarbia, PCCM consulted.  Daughter at bedside provides most of history.  Patient's CPAP mask has not been fitting her well so she has not been wearing, trying to get in to DME for mask refitting but having issues with this. Has chronic DOE, orthopnea, and LE swelling.  Placed on BIPAP with some improvement in mentation, requires a good deal of pressure to get any appropriate volumes  Past Medical History  OSA Groups 2/3 pulm HTN Prior VTE in 2017 on lifelong [place] [facility]   2/5 admitted 2/6 code stroke, PCCM consult  Consults:  n/a  Procedures:  n/a  Significant Diagnostic Tests:  Echo pending CT head and chest nothing acute  Micro Data:  COVID neg  Antimicrobials:  none   Interim history/subjective:   Poor TV overnight,  Mask adjusted by RT with improvement  This AM Negative 1600cc since admission  Objective   Blood pressure (!) 106/51, pulse (!) 51, temperature 98.2 F (36.8 C), temperature source Axillary, resp. rate 17, height _0  (1.727 m), weight (!) 187.3 kg, last menstrual period 11/29/2015, SpO2 100 %.    FiO2 (%):  [30 %-40 %] 30 %   Intake/Output Summary (Last 24 hours) at 08/20/2020 1203 Last data filed at 08/20/2020 1200 Gross per 24 hour  Intake 798.02 ml  Output 3125 ml  Net -2326.98 ml   Filed  Weights   08/20/20 0647  Weight: (!) 187.3 kg   General:  Obese F, sleeping on Bipap HEENT: MM pink/moist, bipap mask in place Neuro: Initially barely opens eyes to sternal rub, not following commands.  After ventilation improvement more awake CV: s1s2 rrr, no m/r/g PULM:  Distant with no rhonchi or wheezing  GI: soft, bsx4 active  Extremities: warm/dry, 1+ edema  Skin: no rashes or lesions   Resolved Hospital Problem list   n/a  Assessment & Plan:  Acute on chronic hypercarbia in setting of OHS/OSA overlap syndrome (AHI 98.6 requiring BIPAP for adequate control), groups 2/3 pulmonary HTN, (question of group 4 w/ her chronic LLE DVT) and poor outpatient BIPAP compliance.  Perhaps worsened by new supplemental O2 reducing respiratory drive. Acute hypercarbic encephalopathy P: -Repeat Gas improving, though remains hypercapnic, requiring high Bipap pressures -Mental status improving, so continue bipap for now -Echo yesterday with EF 60-65%, indeterminate diastolic function -Continue Lasix 82m q6hr and monitor UOP and K -Continue ICU care, if mental status improves throughout the day, may be able to transfer back to progressive care - Last seen by WJoya Gaskins7/29/21, message sent to see if can arrange DME mask refitting or would like patient to transition care to VS/AO/RA).  Really needs another OPO and/or CPAP titration study.  Best practice:  Diet: NPO Pain/Anxiety/Delirium protocol (if indicated): none VAP protocol (if indicated): n/a DVT prophylaxis: eliquis if can take PO, no urgent need to  resume this GI prophylaxis: n/a Glucose control: n/a Mobility: BR Code Status: full Family Communication: pending for 2/7 Disposition: ICU pending improement    Medical Decision Making    Diagnoses that are immediately life threatening include hypercarbic encephalopathy, acute on chronic hypercarbic respiratory failure Critical test findings: ABG above Interventions today to address these  diagnoses are BIPAP, diuresis trial Likelihood of life-threatening deterioration without intervention is high.  Labs   CBC: Recent Labs  Lab 08/18/20 0246 08/19/20 0323 08/20/20 0606  WBC 4.6  --  4.6  NEUTROABS  --   --  3.3  HGB 13.0 16.0* 10.9*  HCT 41.9 47.0* 37.7  MCV 95.7  --  100.0  PLT 226  --  557    Basic Metabolic Panel: Recent Labs  Lab 08/18/20 0246 08/19/20 0323 08/19/20 0439 08/20/20 0439  NA 141 138 142 140  K 4.2 4.8 4.8 4.4  CL 101  --  101 98  CO2 30  --  33* 31  GLUCOSE 78  --  93 207*  BUN 15  --  11 14  CREATININE 1.02*  --  0.94 0.93  CALCIUM 9.0  --  9.0 8.8*   GFR: Estimated Creatinine Clearance: 122.3 mL/min (by C-G formula based on SCr of 0.93 mg/dL). Recent Labs  Lab 08/18/20 0246 08/20/20 0606  WBC 4.6 4.6    Liver Function Tests: No results for input(s): AST, ALT, ALKPHOS, BILITOT, PROT, ALBUMIN in the last 168 hours. No results for input(s): LIPASE, AMYLASE in the last 168 hours. No results for input(s): AMMONIA in the last 168 hours.  ABG    Component Value Date/Time   PHART 7.097 (LL) 08/19/2020 1203   PCO2ART >120.0 (HH) 08/19/2020 1203   PO2ART 117 (H) 08/19/2020 1203   HCO3 36.1 (H) 08/19/2020 1203   TCO2 30 08/19/2020 0323   ACIDBASEDEF 3.0 (H) 11/22/2019 1119   O2SAT 97.2 08/19/2020 1203     Coagulation Profile: No results for input(s): INR, PROTIME in the last 168 hours.  Cardiac Enzymes: No results for input(s): CKTOTAL, CKMB, CKMBINDEX, TROPONINI in the last 168 hours.  HbA1C: Hemoglobin A1C  Date/Time Value Ref Range Status  06/10/2016 04:41 PM 5.4  Final    CBG: Recent Labs  Lab 08/20/20 0435 08/20/20 0524 08/20/20 0636 08/20/20 0851 08/20/20 1036  GLUCAP 186* 127* 88 82 113*    Review of Systems:   Cannot assess due to degree of somnolence  Past Medical History  She,  has a past medical history of DVT (deep vein thrombosis) in pregnancy, Dyspnea, Morbid obesity ([place]), PE (pulmonary  embolism) (02/29/2016), Peripheral vascular disease ([place]), and Sleep apnea.   Surgical History    Past Surgical History:  Procedure Laterality Date  . CESAREAN SECTION     x 3  . OPEN REDUCTION INTERNAL FIXATION (ORIF) DISTAL RADIAL FRACTURE Right 06/20/2019   Procedure: OPEN REDUCTION INTERNAL FIXATION (ORIF) DISTAL RADIAL FRACTURE;  Surgeon: [name], MD;  Location: [facility];  Service: Orthopedics;  Laterality: Right;  REGIONAL BLOCK TOTAL SURGERY TIME: 90 MINUTES  . SYNOVECTOMY Right 11/21/2019   Procedure: RIGHT WRIST LIGAMENT RECONSTRUCTION;  Surgeon: [name], MD;  Location: [facility];  Service: Orthopedics;  Laterality: Right;  PROCEDURE: RIGHT WRIST LIGAMENT RECONSTRUCTION LENGTH OF SURGERY: 105 MIN     Social History   reports that she has quit smoking. She has never used smokeless tobacco. She reports that she does not drink alcohol and does not use drugs.   Family History  Her family history is not on file.   Allergies No Known Allergies   Home Medications  Prior to Admission medications   Medication Sig Start Date End Date Taking? Authorizing Jalicia Roszak  acetaminophen (TYLENOL) 325 MG tablet Take 2 tablets (650 mg total) by mouth every 6 (six) hours. Patient taking differently: Take 650 mg by mouth every 6 (six) hours as needed for mild pain or headache. 11/21/19  Yes [name], MD  apixaban (ELIQUIS) 5 MG TABS tablet Take 1 tablet (5 mg total) by mouth 2 (two) times daily. 08/18/20 12/16/20 Yes Garald Balding, PA-C  Blood Pressure Monitor DEVI Take blood pressure daily 02/09/20  Yes Elsie Stain, MD  ibuprofen (ADVIL) 200 MG tablet Take 600 mg by mouth every 6 (six) hours as needed for headache or moderate pain.   Yes Rion Catala, Historical, MD  PRESCRIPTION MEDICATION CPAP- At bedtime   Yes Rudolph [name], Historical, MD     Critical care time: 35 minutes not including any separately billable procedures    CRITICAL CARE Performed by: Otilio Carpen Gleason   Total  critical care time: 35 minutes  Critical care time was exclusive of separately billable procedures and treating other patients.  Critical care was necessary to treat or prevent imminent or life-threatening deterioration.  Critical care was time spent personally by me on the following activities: development of treatment plan with patient and/or surrogate as well as nursing, discussions with consultants, evaluation of patient's response to treatment, examination of patient, obtaining history from patient or surrogate, ordering and performing treatments and interventions, ordering and review of laboratory studies, ordering and review of radiographic studies, pulse oximetry and re-evaluation of patient's condition.  Otilio Carpen Gleason, PA-C [facility] See Amion for pager If no response to pager , please call [phone] until 7pm After 7:00 pm call Elink  [phone]

## 2020-08-20 NOTE — Progress Notes (Signed)
Hypoglycemic Event  CBG: 65 0400  Treatment: D50 50 mL (25 gm) 0415   Symptoms: None  Follow-up CBG: Time:0435 CBG Result:186  Possible Reasons for Event: Other: Patient NPO and on BIPAP  Comments/MD notified: MD notified. D10 ordered to be increased and D50 given.     Capri Raben A Isella Slatten

## 2020-08-20 NOTE — Progress Notes (Signed)
ABG performed on patient. Results given to MD. RT noticed this afternoon ISTAT results did not crossover into the system.

## 2020-08-21 ENCOUNTER — Inpatient Hospital Stay (HOSPITAL_COMMUNITY): Payer: Medicaid Other

## 2020-08-21 DIAGNOSIS — J9611 Chronic respiratory failure with hypoxia: Secondary | ICD-10-CM | POA: Diagnosis not present

## 2020-08-21 DIAGNOSIS — R0602 Shortness of breath: Secondary | ICD-10-CM | POA: Diagnosis not present

## 2020-08-21 DIAGNOSIS — G4733 Obstructive sleep apnea (adult) (pediatric): Secondary | ICD-10-CM | POA: Diagnosis not present

## 2020-08-21 DIAGNOSIS — I10 Essential (primary) hypertension: Secondary | ICD-10-CM | POA: Diagnosis not present

## 2020-08-21 DIAGNOSIS — Z9989 Dependence on other enabling machines and devices: Secondary | ICD-10-CM | POA: Diagnosis not present

## 2020-08-21 DIAGNOSIS — I517 Cardiomegaly: Secondary | ICD-10-CM | POA: Diagnosis not present

## 2020-08-21 LAB — GLUCOSE, CAPILLARY
Glucose-Capillary: 82 mg/dL (ref 70–99)
Glucose-Capillary: 72 mg/dL (ref 70–99)
Glucose-Capillary: 90 mg/dL (ref 70–99)
Glucose-Capillary: 88 mg/dL (ref 70–99)
Glucose-Capillary: 92 mg/dL (ref 70–99)
Glucose-Capillary: 89 mg/dL (ref 70–99)
Glucose-Capillary: 99 mg/dL (ref 70–99)
Glucose-Capillary: 87 mg/dL (ref 70–99)

## 2020-08-21 NOTE — Plan of Care (Signed)

## 2020-08-21 NOTE — Progress Notes (Signed)
NAMEAvonna Brewer, MRN:  [id number], DOB:  September 03, 1965, LOS: 2 ADMISSION DATE:  08/18/2020, CONSULTATION DATE:  08/19/20 REFERRING MD:  TRH, CHIEF COMPLAINT:  SOB   Brief History   55 year old woman with end stage sequelae of obesity, prior VTE presenting with acute on chronic hypercarbic and hypoxemic respiratory failure.  History of present illness   55 year old woman with a history of provoked VTE, class 3 obesity, OSA with intermittent CPAP compliace presenting with acute on chronic SOB.  Workup in ER showed hypoxemia, CTA chest benign.  Developed progressive lethargy in ER so code stroke called, ABG showed severe acute on chronic hypercarbia, PCCM consulted.  Daughter at bedside provides most of history.  Patient's CPAP mask has not been fitting her well so she has not been wearing, trying to get in to DME for mask refitting but having issues with this. Has chronic DOE, orthopnea, and LE swelling.  Placed on BIPAP with some improvement in mentation, requires a good deal of pressure to get any appropriate volumes  Past Medical History  OSA Groups 2/3 pulm HTN Prior VTE in 2017 on lifelong [facility] Events   2/5 admitted 2/6 code stroke, PCCM consult 2/7 improving, trial eating off BiPAP  Consults:  n/a  Procedures:  n/a  Significant Diagnostic Tests:  Echo pending CT head and chest nothing acute  Micro Data:  COVID neg  Antimicrobials:  none   Interim history/subjective:   Mental status and ventilation improvement today  Objective   Blood pressure 116/63, pulse (!) 54, temperature 97.7 F (36.5 C), temperature source Axillary, resp. rate 19, height _0  (1.727 m), weight (!) 187.3 kg, last menstrual period 11/29/2015, SpO2 100 %.    FiO2 (%):  [30 %-40 %] 40 %   Intake/Output Summary (Last 24 hours) at 08/21/2020 3875 Last data filed at 08/21/2020 0600 Gross per 24 hour  Intake 1407.98 ml  Output 1075 ml  Net 332.98 ml   Filed Weights    08/20/20 0647  Weight: (!) 187.3 kg   General: Awake, alert, BiPAP mask on but oriented and conversational HEENT: MM pink/moist Neuro: Awake and alert, no obvious focal deficits CV: s1s2 RRR, no m/r/g PULM: Remains on BiPAP, high tidal volumes, reduced IPAP from 20 to 16, 30% FiO2 no respiratory distress and oxygen saturations greater than 92% GI: soft, bsx4 active  Extremities: warm/dry, 1+ edema  Skin: no rashes or lesions    Resolved Hospital Problem list   n/a  Assessment & Plan:    Acute on chronic hypercarbia in setting of OHS/OSA overlap syndrome (AHI 98.6 requiring BIPAP for adequate control), groups 2/3 pulmonary HTN, (question of group 4 w/ her chronic LLE DVT) and poor outpatient BIPAP compliance.  Perhaps worsened by new supplemental O2 reducing respiratory drive. Acute hypercarbic encephalopathy Echo yesterday with EF 60-65%, indeterminate diastolic function P: -Much improved today with better ventilation, awake and alert, tolerating eating off BiPAP mask -Likely transfer out of ICU today if remains stable, needs BiPAP nightly -Last seen by Joya Gaskins 02/09/20, message was sent to see if can arrange DME mask refitting or would like patient to transition care to VS/AO/RA).  Really needs another OPO and/or CPAP titration study. -Diuresed, now net negative, hold further Lasix -Continue Eliquis for chronic LLE DVT -As needed albuterol  F/E/N Stop D10 infusion, now eating diet, check a.m. metabolic panel  Best practice:  Diet: NPO Pain/Anxiety/Delirium protocol (if indicated): none VAP protocol (if indicated): n/a DVT  prophylaxis: eliquis if can take PO, no urgent need to resume this GI prophylaxis: n/a Glucose control: n/a Mobility: BR Code Status: full Family Communication:  Disposition: Likely back to progressive care tomorrow  Medical Decision Making    Diagnoses that are immediately life threatening include hypercarbic encephalopathy, acute on chronic  hypercarbic respiratory failure Critical test findings: ABG above Interventions today to address these diagnoses are BIPAP, diuresis trial Likelihood of life-threatening deterioration without intervention is high.  Labs   CBC: Recent Labs  Lab 08/18/20 0246 08/19/20 0323 08/20/20 0437 08/20/20 0606 08/20/20 0912  WBC 4.6  --   --  4.6  --   NEUTROABS  --   --   --  3.3  --   HGB 13.0 16.0* 12.6 10.9* 11.6*  HCT 41.9 47.0* 37.0 37.7 34.0*  MCV 95.7  --   --  100.0  --   PLT 226  --   --  161  --     Basic Metabolic Panel: Recent Labs  Lab 08/18/20 0246 08/19/20 0323 08/19/20 0439 08/20/20 0437 08/20/20 0439 08/20/20 0912  NA 141 138 142 142 140 140  K 4.2 4.8 4.8 4.5 4.4 4.3  CL 101  --  101  --  98  --   CO2 30  --  33*  --  31  --   GLUCOSE 78  --  93  --  207*  --   BUN 15  --  11  --  14  --   CREATININE 1.02*  --  0.94  --  0.93  --   CALCIUM 9.0  --  9.0  --  8.8*  --    GFR: Estimated Creatinine Clearance: 122.3 mL/min (by C-G formula based on SCr of 0.93 mg/dL). Recent Labs  Lab 08/18/20 0246 08/20/20 0606  WBC 4.6 4.6    Liver Function Tests: No results for input(s): AST, ALT, ALKPHOS, BILITOT, PROT, ALBUMIN in the last 168 hours. No results for input(s): LIPASE, AMYLASE in the last 168 hours. No results for input(s): AMMONIA in the last 168 hours.  ABG    Component Value Date/Time   PHART 7.293 (L) 08/20/2020 0912   PCO2ART 86.7 (HH) 08/20/2020 0912   PO2ART 73 (L) 08/20/2020 0912   HCO3 42.2 (H) 08/20/2020 0912   TCO2 45 (H) 08/20/2020 0912   ACIDBASEDEF 3.0 (H) 11/22/2019 1119   O2SAT 92.0 08/20/2020 0912     Coagulation Profile: No results for input(s): INR, PROTIME in the last 168 hours.  Cardiac Enzymes: No results for input(s): CKTOTAL, CKMB, CKMBINDEX, TROPONINI in the last 168 hours.  HbA1C: Hemoglobin A1C  Date/Time Value Ref Range Status  06/10/2016 04:41 PM 5.4  Final    CBG: Recent Labs  Lab 08/20/20 2351  08/21/20 0111 08/21/20 0437 08/21/20 0559 08/21/20 0752  GLUCAP 86 82 72 90 88    Review of Systems:   Cannot assess due to degree of somnolence  Past Medical History  She,  has a past medical history of DVT (deep vein thrombosis) in pregnancy, Dyspnea, Morbid obesity ([place]), PE (pulmonary embolism) (02/29/2016), Peripheral vascular disease ([place]), and Sleep apnea.   Surgical History    Past Surgical History:  Procedure Laterality Date  . CESAREAN SECTION     x 3  . OPEN REDUCTION INTERNAL FIXATION (ORIF) DISTAL RADIAL FRACTURE Right 06/20/2019   Procedure: OPEN REDUCTION INTERNAL FIXATION (ORIF) DISTAL RADIAL FRACTURE;  Surgeon: [name], MD;  Location: [facility];  Service: Orthopedics;  Laterality: Right;  REGIONAL BLOCK TOTAL SURGERY TIME: 90 MINUTES  . SYNOVECTOMY Right 11/21/2019   Procedure: RIGHT WRIST LIGAMENT RECONSTRUCTION;  Surgeon: [name], MD;  Location: [facility];  Service: Orthopedics;  Laterality: Right;  PROCEDURE: RIGHT WRIST LIGAMENT RECONSTRUCTION LENGTH OF SURGERY: 105 MIN     Social History   reports that she has quit smoking. She has never used smokeless tobacco. She reports that she does not drink alcohol and does not use drugs.   Family History   Her family history is not on file.   Allergies No Known Allergies   Home Medications  Prior to Admission medications   Medication Sig Start Date End Date Taking? Authorizing Provider  acetaminophen (TYLENOL) 325 MG tablet Take 2 tablets (650 mg total) by mouth every 6 (six) hours. Patient taking differently: Take 650 mg by mouth every 6 (six) hours as needed for mild pain or headache. 11/21/19  Yes [name], MD  apixaban (ELIQUIS) 5 MG TABS tablet Take 1 tablet (5 mg total) by mouth 2 (two) times daily. 08/18/20 12/16/20 Yes Garald Balding, PA-C  Blood Pressure Monitor DEVI Take blood pressure daily 02/09/20  Yes Elsie Stain, MD  ibuprofen (ADVIL) 200 MG tablet Take 600 mg by mouth every 6 (six)  hours as needed for headache or moderate pain.   Yes [provider]  PRESCRIPTION MEDICATION CPAP- At bedtime   Yes [provider]     Critical care time: 32 minutes not including any separately billable procedures    CRITICAL CARE Performed by: Otilio Carpen Pami Wool   Total critical care time: 32 minutes  Critical care time was exclusive of separately billable procedures and treating other patients.  Critical care was necessary to treat or prevent imminent or life-threatening deterioration.  Critical care was time spent personally by me on the following activities: development of treatment plan with patient and/or surrogate as well as nursing, discussions with consultants, evaluation of patient's response to treatment, examination of patient, obtaining history from patient or surrogate, ordering and performing treatments and interventions, ordering and review of laboratory studies, ordering and review of radiographic studies, pulse oximetry and re-evaluation of patient's condition.  Otilio Carpen Margues Filippini, PA-C [facility] See Amion for pager If no response to pager , please call [phone] until 7pm After 7:00 pm call Elink  [phone]?[place]

## 2020-08-21 NOTE — Progress Notes (Signed)
Neuro: Pt A&OX4, neuro exam WNL throughout shift.   Respiratory: Pt on 3Lo2 via [place], came off Bipap around 0800, o2 sats WNL.   Cardiovascular: Pt afebrile, BP soft, but WNL and HR normal and brady in 50's at times.   GI/GU: Pt voiding with pure wick in place,  on heart healthy diet at this time and tolerating with no issues. Last BM prior to admission.   Skin: Skin intact with no s/s of skin breakdown at this time. Pt able to reposition independently.    Pain: Pt with no reports of pain at this time.   Lines: Pt with PIVX2 in place, no signs or symptoms of complications at this time.   Events: NO acute events throughout shift. Pts plan of care to continue with current regimen, Family at bedside and updated with no further questions at this time.

## 2020-08-22 DIAGNOSIS — D649 Anemia, unspecified: Secondary | ICD-10-CM | POA: Diagnosis not present

## 2020-08-22 DIAGNOSIS — J9611 Chronic respiratory failure with hypoxia: Secondary | ICD-10-CM | POA: Diagnosis not present

## 2020-08-22 DIAGNOSIS — G4733 Obstructive sleep apnea (adult) (pediatric): Secondary | ICD-10-CM | POA: Diagnosis not present

## 2020-08-22 DIAGNOSIS — Z9989 Dependence on other enabling machines and devices: Secondary | ICD-10-CM | POA: Diagnosis not present

## 2020-08-22 DIAGNOSIS — I272 Pulmonary hypertension, unspecified: Secondary | ICD-10-CM | POA: Diagnosis not present

## 2020-08-22 DIAGNOSIS — J9621 Acute and chronic respiratory failure with hypoxia: Secondary | ICD-10-CM | POA: Diagnosis not present

## 2020-08-22 DIAGNOSIS — K5900 Constipation, unspecified: Secondary | ICD-10-CM | POA: Diagnosis not present

## 2020-08-22 DIAGNOSIS — I10 Essential (primary) hypertension: Secondary | ICD-10-CM | POA: Diagnosis not present

## 2020-08-22 DIAGNOSIS — J9622 Acute and chronic respiratory failure with hypercapnia: Secondary | ICD-10-CM | POA: Diagnosis not present

## 2020-08-22 LAB — COMPREHENSIVE METABOLIC PANEL
ALT: 10 U/L (ref 0–44)
AST: 16 U/L (ref 15–41)
Albumin: 3.3 g/dL — ABNORMAL LOW (ref 3.5–5.0)
Alkaline Phosphatase: 48 U/L (ref 38–126)
Anion gap: 9 (ref 5–15)
BUN: 18 mg/dL (ref 6–20)
CO2: 37 mmol/L — ABNORMAL HIGH (ref 22–32)
Calcium: 9.5 mg/dL (ref 8.9–10.3)
Chloride: 94 mmol/L — ABNORMAL LOW (ref 98–111)
Creatinine, Ser: 1 mg/dL (ref 0.44–1.00)
GFR, Estimated: >60 mL/min (ref >60)
Glucose, Bld: 83 mg/dL (ref 70–99)
Potassium: 4.3 mmol/L (ref 3.5–5.1)
Sodium: 140 mmol/L (ref 135–145)
Total Bilirubin: 1 mg/dL (ref 0.3–1.2)
Total Protein: 7.4 g/dL (ref 6.5–8.1)

## 2020-08-22 LAB — PHOSPHORUS: Phosphorus: 3.6 mg/dL (ref 2.5–4.6)

## 2020-08-22 LAB — CBC WITH DIFFERENTIAL/PLATELET
Abs Immature Granulocytes: 0.02 10*3/uL (ref 0.00–0.07)
Basophils Absolute: 0.1 10*3/uL (ref 0.0–0.1)
Basophils Relative: 2 %
Eosinophils Absolute: 0.1 10*3/uL (ref 0.0–0.5)
Eosinophils Relative: 4 %
HCT: 39.6 % (ref 36.0–46.0)
Hemoglobin: 12.7 g/dL (ref 12.0–15.0)
Immature Granulocytes: 1 %
Lymphocytes Relative: 28 %
Lymphs Abs: 1 10*3/uL (ref 0.7–4.0)
MCH: 30 pg (ref 26.0–34.0)
MCHC: 32.1 g/dL (ref 30.0–36.0)
MCV: 93.4 fL (ref 80.0–100.0)
Monocytes Absolute: 0.4 10*3/uL (ref 0.1–1.0)
Monocytes Relative: 12 %
Neutro Abs: 1.9 10*3/uL (ref 1.7–7.7)
Neutrophils Relative %: 53 %
Platelets: 174 10*3/uL (ref 150–400)
RBC: 4.24 MIL/uL (ref 3.87–5.11)
RDW: 13.3 % (ref 11.5–15.5)
WBC: 3.5 10*3/uL — ABNORMAL LOW (ref 4.0–10.5)
nRBC: 0 % (ref 0.0–0.2)

## 2020-08-22 LAB — GLUCOSE, CAPILLARY
Glucose-Capillary: 80 mg/dL (ref 70–99)
Glucose-Capillary: 69 mg/dL — ABNORMAL LOW (ref 70–99)
Glucose-Capillary: 81 mg/dL (ref 70–99)
Glucose-Capillary: 96 mg/dL (ref 70–99)
Glucose-Capillary: 80 mg/dL (ref 70–99)
Glucose-Capillary: 107 mg/dL — ABNORMAL HIGH (ref 70–99)

## 2020-08-22 LAB — MAGNESIUM: Magnesium: 1.9 mg/dL (ref 1.7–2.4)

## 2020-08-22 MED ORDER — SENNOSIDES-DOCUSATE SODIUM 8.6-50 MG PO TABS
1.0000 | ORAL_TABLET | Freq: Two times a day (BID) | ORAL | Status: DC
  Filled 2020-08-22 (×3): qty 1

## 2020-08-22 MED ORDER — CHLORHEXIDINE GLUCONATE CLOTH 2 % EX PADS
6.0000 | MEDICATED_PAD | Freq: Every day | CUTANEOUS | Status: DC
  Administered 2020-08-23 – 2020-08-25 (×3): 6 via TOPICAL

## 2020-08-22 NOTE — Progress Notes (Signed)
PROGRESS NOTE    Angel Brewer  TDV:[id number] DOB: 1966-01-03 DOA: 08/18/2020 PCP: [name], NP  Brief Narrative:  Patient is a super morbidly obese African-American 55 year old female with a past medical history significant for provoked DVT, history of class III obesity, OSA with intermittent CPAP compliance, as well as other comorbidities who presented with acute on chronic shortness of breath.  In the ED work-up showed hypoxemia, CTA chest was benign.  She developed progressive lethargy in the ER so code stroke was called.  ABG showed severe acute on chronic hypercarbia and PCCM was consulted.  Daughter was on the provided most of the history at bedside as patient was somnolent.  Patient's CPAP mask has not been fitting her well and she has not been wearing it and trying to get into a DME for mask refitting but having issues with this.  She was placed on BiPAP in the ED with improvement her mentation but required a good deal pressures to get appropriate volumes.  She is stabilized by critical care and was weaned off of her BiPAP to supplemental oxygen via nasal cannula.  She continues with supplemental oxygen currently and requires BiPAP at night.  Her encephalopathy is improved and she was transferred to [facility] service on 08/22/2020  Assessment & Plan:   Principal Problem:   Chronic respiratory failure with hypoxia ([place]) Active Problems:   Morbid obesity ([place])   Essential hypertension   OSA on CPAP   Pulmonary hypertension (HCC)   Respiratory failure (HCC)  Acute on Chronic Hypercarbic Respiratory Failure -presumably due to BiPAP non-adherence but most likely in the setting of OHS/OSA overlap syndrome, groups 2-3 pulmonary hypertension and poor compliance with BiPAP.Marland Kitchen Possible volume overload, habitus limits evaluation.  -ABG Labs on Last check     Component Value Date/Time   PHART 7.293 (L) 08/20/2020 0912   PCO2ART 86.7 (HH) 08/20/2020 0912   PO2ART 73 (L) 08/20/2020 0912   HCO3  42.2 (H) 08/20/2020 0912   TCO2 45 (H) 08/20/2020 0912   ACIDBASEDEF 3.0 (H) 11/22/2019 1119   O2SAT 92.0 08/20/2020 0912   -BNP WNL.Volume felt less likely culprit on Admisison and likely worsened with new supplemental oxygen reducing her respiratory drive -Diuresis now stopped given that she is net negative 2.304 Liters  -Continue BIPAP EVERY NIGHT, referral to sleep medicine  -He continues to be improved with better ventilation and she is awake and alert and pulmonary has referred see if DME mask refitting is needed she will need to be seen by Dr. Chesley Mires, Kara Mead or Dr. Ander Slade; she really needs another OPO and/or a CPAP titration study.  -Echocardiogram showed EF of 60 to 65% with indeterminate diastolic function. -Continue with Albuterol 2 puffs IH every 4 as needed for wheezing shortness of breath -Will need PT/OT to evaluate and Treat and Ambulatory Home O2 Screen    Acute Hypercarbic Encephalopathy -Due to hypercarbia, improved on BiPAP. Stably improved off BiPAP   Leukopenia -Mild with a WBC of 3.5  -continue monitor and trend repeat CBC in a.m.   History of VTE -Resume Apixaban 2/8   OSA -C/w BiPAP qHS and PRN  Normocytic Anemia -Patient's Lat Hgb/Hct was 11.6/34.0 and now was 12.7/39.6 -Check Anemia Panel in the AM  -Continue to Monitor for S/Sx of Bleeding  -Repeat CBC in the AM   Super Morbid Obesity -Complicates overall prognosis and care -Estimated body mass index is 62.78 kg/m as calculated from the following:   Height as of this encounter:  _0  (1.727 m).   Weight as of this encounter: 187.3 kg. -Weight Loss and Dietary Counseling given   Constipation -We will add bowel regimen and start docusate senna docusate and continue MiraLAX 17 g daily.  If necessary will provide a suppository versus enema    DVT prophylaxis: Anticoagulated with Apixaban 5 mg po BID  Code Status: FULL CODE  Family Communication: No family present at bedside   Disposition Plan:   Status is: Inpatient  Remains inpatient appropriate because:Unsafe d/c plan, IV treatments appropriate due to intensity of illness or inability to take PO and Inpatient level of care appropriate due to severity of illness   Dispo: The patient is from: Home              Anticipated d/c is to: Home              Anticipated d/c date is: 2 days              Patient currently is not medically stable to d/c.   Difficult to place patient No  Consultants:   PCCM Transfer    Procedures:  ECHOCARDIOGRAM IMPRESSIONS    1. Left ventricular ejection fraction, by estimation, is 60 to 65%. The  left ventricle has normal function. The left ventricle has no regional  wall motion abnormalities. There is mild left ventricular hypertrophy.  Left ventricular diastolic function  could not be evaluated.  2. Right ventricular systolic function was not well visualized. The right  ventricular size is not well visualized.  3. The mitral valve was not assessed. No evidence of mitral valve  regurgitation.  4. The aortic valve was not well visualized. Aortic valve regurgitation  is not visualized.   Comparison(s): Incomplete echo - patient emergently during the echo to CT  for possible stroke. Echo windows are poor- consider limited follow-up  echo with Definity contrast if necessary.   FINDINGS  Left Ventricle: Left ventricular ejection fraction, by estimation, is 60  to 65%. The left ventricle has normal function. The left ventricle has no  regional wall motion abnormalities. The left ventricular internal cavity  size was normal in size. There is  mild left ventricular hypertrophy. Left ventricular diastolic function  could not be evaluated due to indeterminate diastolic function. Left  ventricular diastolic function could not be evaluated.   Right Ventricle: The right ventricular size is not well visualized. Right  vetricular wall thickness was not assessed. Right  ventricular systolic  function was not well visualized.   Left Atrium: Left atrial size was normal in size.   Right Atrium: Right atrial size was normal in size.   Pericardium: There is no evidence of pericardial effusion.   Mitral Valve: The mitral valve was not assessed. No evidence of mitral  valve regurgitation.   Tricuspid Valve: The tricuspid valve is not assessed. Tricuspid valve  regurgitation is not demonstrated.   Aortic Valve: The aortic valve was not well visualized. Aortic valve  regurgitation is not visualized.   Pulmonic Valve: The pulmonic valve was grossly normal. Pulmonic valve  regurgitation is trivial.   Aorta: The aortic root was not well visualized.   IAS/Shunts: No atrial level shunt detected by color flow Doppler.     LEFT VENTRICLE  PLAX 2D  LVIDd:     4.30 cm  LVIDs:     3.20 cm  LV PW:     1.20 cm  LV IVS:    1.30 cm  LVOT diam:   2.60  cm  LVOT Area:   5.31 cm     LEFT ATRIUM      Index  LA diam:   3.50 cm 1.27 cm/m  LA Vol (A4C): 67.6 ml 24.56 ml/m    AORTA  Ao Root diam: 3.40 cm     SHUNTS  Systemic Diam: 2.60 cm   Antimicrobials:  Anti-infectives (From admission, onward)   None        Subjective: Seen and examined at bedside and she was doing okay.  Had some mild abdominal pain and still has some mild shortness of breath.  Note chest pain or lightheadedness or dizziness.  No other concerns or plans at this time.  Has not had a bowel movement yet.  Objective: Vitals:   08/22/20 0356 08/22/20 0400 08/22/20 0500 08/22/20 0600  BP:  (!) 142/75 (!) 149/81 (!) 152/81  Pulse:  (!) 55 (!) 51 (!) 52  Resp:  _0 Temp: 97.6 F (36.4 C)     TempSrc: Axillary     SpO2:  96% 100% 99%  Weight:      Height:        Intake/Output Summary (Last 24 hours) at 08/22/2020 0744 Last data filed at 08/22/2020 0300 Gross per 24 hour  Intake 732.5 ml  Output 1700 ml  Net -967.5 ml   Filed Weights    08/20/20 0647  Weight: (!) 187.3 kg   Examination: Physical Exam:  Constitutional: WN/WD super morbidly obese African-American female currently no acute distress appears calm Eyes: Lids and conjunctivae normal, sclerae anicteric  ENMT: External Ears, Nose appear normal. Grossly normal hearing. Neck: Appears normal, supple, no cervical masses, normal ROM, no appreciable thyromegaly,: No JVD Respiratory: Diminished to auscultation bilaterally, no wheezing, rales, rhonchi or crackles. Normal respiratory effort and patient is not tachypenic. No accessory muscle use.  Unlabored breathing Cardiovascular: RRR, no murmurs / rubs / gallops. S1 and S2 auscultated.  Has 1+ lower extremity edema Abdomen: Soft, mildly-tender, distended secondary to body habitus. Bowel sounds positive.  GU: Deferred. Musculoskeletal: No clubbing / cyanosis of digits/nails. No joint deformity upper and lower extremities on limited skin evaluation.  Skin: No rashes, lesions, ulcers on limited skin evaluation. No induration; Warm and dry.  Neurologic: CN 2-12 grossly intact with no focal deficits. Romberg sign and cerebellar reflexes not assessed.  Psychiatric: Normal judgment and insight. Alert and oriented x 3. Normal mood and appropriate affect.   Data Reviewed: I have personally reviewed following labs and imaging studies  CBC: Recent Labs  Lab 08/18/20 0246 08/19/20 0323 08/20/20 0437 08/20/20 0606 08/20/20 0912  WBC 4.6  --   --  4.6  --   NEUTROABS  --   --   --  3.3  --   HGB 13.0 16.0* 12.6 10.9* 11.6*  HCT 41.9 47.0* 37.0 37.7 34.0*  MCV 95.7  --   --  100.0  --   PLT 226  --   --  161  --    Basic Metabolic Panel: Recent Labs  Lab 08/18/20 0246 08/19/20 0323 08/19/20 0439 08/20/20 0437 08/20/20 0439 08/20/20 0912  NA 141 138 142 142 140 140  K 4.2 4.8 4.8 4.5 4.4 4.3  CL 101  --  101  --  98  --   CO2 30  --  33*  --  31  --   GLUCOSE 78  --  93  --  207*  --   BUN 15  --  11  --  14  --    CREATININE 1.02*  --  0.94  --  0.93  --   CALCIUM 9.0  --  9.0  --  8.8*  --    GFR: Estimated Creatinine Clearance: 122.3 mL/min (by C-G formula based on SCr of 0.93 mg/dL). Liver Function Tests: No results for input(s): AST, ALT, ALKPHOS, BILITOT, PROT, ALBUMIN in the last 168 hours. No results for input(s): LIPASE, AMYLASE in the last 168 hours. No results for input(s): AMMONIA in the last 168 hours. Coagulation Profile: No results for input(s): INR, PROTIME in the last 168 hours. Cardiac Enzymes: No results for input(s): CKTOTAL, CKMB, CKMBINDEX, TROPONINI in the last 168 hours. BNP (last 3 results) No results for input(s): PROBNP in the last 8760 hours. HbA1C: No results for input(s): HGBA1C in the last 72 hours. CBG: Recent Labs  Lab 08/21/20 1126 08/21/20 1558 08/21/20 2001 08/21/20 2326 08/22/20 0321  GLUCAP 92 89 99 87 80   Lipid Profile: No results for input(s): CHOL, HDL, LDLCALC, TRIG, CHOLHDL, LDLDIRECT in the last 72 hours. Thyroid Function Tests: No results for input(s): TSH, T4TOTAL, FREET4, T3FREE, THYROIDAB in the last 72 hours. Anemia Panel: No results for input(s): VITAMINB12, FOLATE, FERRITIN, TIBC, IRON, RETICCTPCT in the last 72 hours. Sepsis Labs: No results for input(s): PROCALCITON, LATICACIDVEN in the last 168 hours.  Recent Results (from the past 240 hour(s))  SARS Coronavirus 2 by RT PCR (hospital order, performed in [facility] hospital lab) Nasopharyngeal Nasopharyngeal Swab     Status: None   Collection Time: 08/18/20  6:21 PM   Specimen: Nasopharyngeal Swab  Result Value Ref Range Status   SARS Coronavirus 2 NEGATIVE NEGATIVE Final    Comment: (NOTE) SARS-CoV-2 target nucleic acids are NOT DETECTED.  The SARS-CoV-2 RNA is generally detectable in upper and lower respiratory specimens during the acute phase of infection. The lowest concentration of SARS-CoV-2 viral copies this assay can detect is 250 copies / mL. A negative result does  not preclude SARS-CoV-2 infection and should not be used as the sole basis for treatment or other patient management decisions.  A negative result may occur with improper specimen collection / handling, submission of specimen other than nasopharyngeal swab, presence of viral mutation(s) within the areas targeted by this assay, and inadequate number of viral copies (<250 copies / mL). A negative result must be combined with clinical observations, patient history, and epidemiological information.  Fact Sheet for Patients:   StrictlyIdeas.no  Fact Sheet for Healthcare Providers: BankingDealers.co.za  This test is not yet approved or  cleared by the [place] FDA and has been authorized for detection and/or diagnosis of SARS-CoV-2 by FDA under an Emergency Use Authorization (EUA).  This EUA will remain in effect (meaning this test can be used) for the duration of the COVID-19 declaration under Section 564(b)(1) of the Act, 21 U.S.C. section 360bbb-3(b)(1), unless the authorization is terminated or revoked sooner.  Performed at [facility], [place] [address], [place], [place] [address]   MRSA PCR Screening     Status: None   Collection Time: 08/19/20 10:33 PM   Specimen: Nasal Mucosa; Nasopharyngeal  Result Value Ref Range Status   MRSA by PCR NEGATIVE NEGATIVE Final    Comment:        The GeneXpert MRSA Assay (FDA approved for NASAL specimens only), is one component of a comprehensive MRSA colonization surveillance program. It is not intended to diagnose MRSA infection nor to guide or monitor treatment for MRSA  infections. Performed at [facility], [place] [address] [place], [place] [address]      RN Pressure Injury Documentation:     Estimated body mass index is 62.78 kg/m as calculated from the following:   Height as of this encounter: _0  (1.727 m).   Weight as of this encounter: 187.3 kg.  Malnutrition  Type:   Malnutrition Characteristics:   Nutrition Interventions:     Radiology Studies: DG CHEST PORT 1 VIEW  Result Date: 08/21/2020 CLINICAL DATA:  55 year old female with shortness of breath. Cardiomegaly. EXAM: PORTABLE CHEST 1 VIEW COMPARISON:  CTA chest 08/18/2020 and earlier. FINDINGS: Portable AP semi upright view at 0331 hours. Mildly rotated to the right today. Stable cardiomegaly and mediastinal contours. Stable lung volumes. Stable pulmonary vascularity, increased compared to 2017 radiographs. No pneumothorax, pleural effusion or consolidation. Negative visible bowel gas, osseous structures. Visualized tracheal air column is within normal limits. IMPRESSION: Stable cardiomegaly and pulmonary interstitial edema. Electronically Signed   By: Genevie Ann M.D.   On: 08/21/2020 07:42   Scheduled Meds: . apixaban  5 mg Oral BID  . chlorhexidine  15 mL Mouth Rinse BID  . Chlorhexidine Gluconate Cloth  6 each Topical Daily  . docusate sodium  100 mg Oral BID  . mouth rinse  15 mL Mouth Rinse q12n4p  . polyethylene glycol  17 g Oral Daily  . sodium chloride flush  3 mL Intravenous Q12H   Continuous Infusions: . sodium chloride       LOS: 3 days   Kerney Elbe, DO [facility] PAGER is on AMION  If 7PM-7AM, please contact night-coverage [email or web address]

## 2020-08-23 ENCOUNTER — Inpatient Hospital Stay (HOSPITAL_COMMUNITY): Payer: Medicaid Other

## 2020-08-23 DIAGNOSIS — G4733 Obstructive sleep apnea (adult) (pediatric): Secondary | ICD-10-CM | POA: Diagnosis not present

## 2020-08-23 DIAGNOSIS — D649 Anemia, unspecified: Secondary | ICD-10-CM | POA: Diagnosis not present

## 2020-08-23 DIAGNOSIS — J9611 Chronic respiratory failure with hypoxia: Secondary | ICD-10-CM | POA: Diagnosis not present

## 2020-08-23 DIAGNOSIS — I10 Essential (primary) hypertension: Secondary | ICD-10-CM | POA: Diagnosis not present

## 2020-08-23 DIAGNOSIS — J9621 Acute and chronic respiratory failure with hypoxia: Secondary | ICD-10-CM | POA: Diagnosis not present

## 2020-08-23 DIAGNOSIS — Z9989 Dependence on other enabling machines and devices: Secondary | ICD-10-CM | POA: Diagnosis not present

## 2020-08-23 DIAGNOSIS — J9622 Acute and chronic respiratory failure with hypercapnia: Secondary | ICD-10-CM | POA: Diagnosis not present

## 2020-08-23 DIAGNOSIS — R0602 Shortness of breath: Secondary | ICD-10-CM | POA: Diagnosis not present

## 2020-08-23 DIAGNOSIS — I517 Cardiomegaly: Secondary | ICD-10-CM | POA: Diagnosis not present

## 2020-08-23 DIAGNOSIS — I272 Pulmonary hypertension, unspecified: Secondary | ICD-10-CM | POA: Diagnosis not present

## 2020-08-23 DIAGNOSIS — K5900 Constipation, unspecified: Secondary | ICD-10-CM | POA: Diagnosis not present

## 2020-08-23 LAB — CBC WITH DIFFERENTIAL/PLATELET
Abs Immature Granulocytes: 0.03 10*3/uL (ref 0.00–0.07)
Basophils Absolute: 0 10*3/uL (ref 0.0–0.1)
Basophils Relative: 1 %
Eosinophils Absolute: 0.1 10*3/uL (ref 0.0–0.5)
Eosinophils Relative: 2 %
HCT: 40 % (ref 36.0–46.0)
Hemoglobin: 12.3 g/dL (ref 12.0–15.0)
Immature Granulocytes: 1 %
Lymphocytes Relative: 20 %
Lymphs Abs: 1 10*3/uL (ref 0.7–4.0)
MCH: 28.7 pg (ref 26.0–34.0)
MCHC: 30.8 g/dL (ref 30.0–36.0)
MCV: 93.2 fL (ref 80.0–100.0)
Monocytes Absolute: 0.6 10*3/uL (ref 0.1–1.0)
Monocytes Relative: 12 %
Neutro Abs: 3.2 10*3/uL (ref 1.7–7.7)
Neutrophils Relative %: 64 %
Platelets: 181 10*3/uL (ref 150–400)
RBC: 4.29 MIL/uL (ref 3.87–5.11)
RDW: 13.3 % (ref 11.5–15.5)
WBC: 5 10*3/uL (ref 4.0–10.5)
nRBC: 0 % (ref 0.0–0.2)

## 2020-08-23 LAB — COMPREHENSIVE METABOLIC PANEL
ALT: 9 U/L (ref 0–44)
AST: 13 U/L — ABNORMAL LOW (ref 15–41)
Albumin: 3.2 g/dL — ABNORMAL LOW (ref 3.5–5.0)
Alkaline Phosphatase: 49 U/L (ref 38–126)
Anion gap: 11 (ref 5–15)
BUN: 21 mg/dL — ABNORMAL HIGH (ref 6–20)
CO2: 31 mmol/L (ref 22–32)
Calcium: 9.3 mg/dL (ref 8.9–10.3)
Chloride: 96 mmol/L — ABNORMAL LOW (ref 98–111)
Creatinine, Ser: 0.89 mg/dL (ref 0.44–1.00)
GFR, Estimated: >60 mL/min (ref >60)
Glucose, Bld: 100 mg/dL — ABNORMAL HIGH (ref 70–99)
Potassium: 4.2 mmol/L (ref 3.5–5.1)
Sodium: 138 mmol/L (ref 135–145)
Total Bilirubin: 0.9 mg/dL (ref 0.3–1.2)
Total Protein: 7.5 g/dL (ref 6.5–8.1)

## 2020-08-23 LAB — GLUCOSE, CAPILLARY
Glucose-Capillary: 94 mg/dL (ref 70–99)
Glucose-Capillary: 79 mg/dL (ref 70–99)
Glucose-Capillary: 76 mg/dL (ref 70–99)
Glucose-Capillary: 70 mg/dL (ref 70–99)
Glucose-Capillary: 99 mg/dL (ref 70–99)
Glucose-Capillary: 85 mg/dL (ref 70–99)

## 2020-08-23 LAB — RETICULOCYTES
Immature Retic Fract: 13.8 % (ref 2.3–15.9)
RBC.: 4.28 MIL/uL (ref 3.87–5.11)
Retic Count, Absolute: 95.9 10*3/uL (ref 19.0–186.0)
Retic Ct Pct: 2.2 % (ref 0.4–3.1)

## 2020-08-23 LAB — IRON AND TIBC
Iron: 40 ug/dL (ref 28–170)
Saturation Ratios: 13 % (ref 10.4–31.8)
TIBC: 319 ug/dL (ref 250–450)
UIBC: 279 ug/dL

## 2020-08-23 LAB — MAGNESIUM: Magnesium: 2 mg/dL (ref 1.7–2.4)

## 2020-08-23 LAB — VITAMIN B12: Vitamin B-12: 107 pg/mL — ABNORMAL LOW (ref 180–914)

## 2020-08-23 LAB — FOLATE: Folate: 13.4 ng/mL (ref >5.9)

## 2020-08-23 LAB — PHOSPHORUS: Phosphorus: 4.3 mg/dL (ref 2.5–4.6)

## 2020-08-23 LAB — FERRITIN: Ferritin: 142 ng/mL (ref 11–307)

## 2020-08-23 MED ORDER — CYANOCOBALAMIN 1000 MCG/ML IJ SOLN
1000.0000 ug | Freq: Once | INTRAMUSCULAR | Status: AC
  Administered 2020-08-23: 1000 ug via INTRAMUSCULAR
  Filled 2020-08-23: qty 1

## 2020-08-23 MED ORDER — GUAIFENESIN ER 600 MG PO TB12
1200.0000 mg | ORAL_TABLET | Freq: Two times a day (BID) | ORAL | Status: DC
  Administered 2020-08-23 – 2020-08-25 (×4): 1200 mg via ORAL
  Filled 2020-08-23 (×6): qty 2

## 2020-08-23 MED ORDER — FUROSEMIDE 10 MG/ML IJ SOLN
40.0000 mg | Freq: Once | INTRAMUSCULAR | Status: AC
  Administered 2020-08-23: 40 mg via INTRAVENOUS
  Filled 2020-08-23: qty 4

## 2020-08-23 NOTE — Progress Notes (Signed)
Occupational Therapy Evaluation Patient Details Name: Angel Brewer MRN: [id number] DOB: [birth year] Today's Date: 08/23/2020    History of Present Illness 55 yo admitted 2/5 with SOB, hypoxia and acute on chronic hypercarbic respiratory failure requiring Bipap. Home CPAP mask hasn't been fitting and pt was not wearing due to awaiting mask refitting. PMhx: obesity, OSA on home CPAP, DVT   Clinical Impression   PTA pt lives with her son, works at [employer]	[place] and was modified independent with mobility, IADL tasks and ADL with use of AE. Desats to 87 on RA without activity. Required 3L at this time to keep sats above 90 with activity. +2 A (Mod from lower surfaces) and Mod A with LB ADL at this time for mobility due to deficits listed below. Able to ambulate to bathroom on 3L. Will benefit from acute OT to maximize functional level of independence to facilitate safe DC home with intermittent S by son. Pt in agreement with plan.     Follow Up Recommendations  Home health OT (pending progress);Supervision - Intermittent (S with mobility and assist for ADL initially)    Equipment Recommendations  Tub/shower seat (bariatric)    Recommendations for Other Services       Precautions / Restrictions Precautions Precautions: Fall      Mobility Bed Mobility Overal bed mobility: Needs Assistance Bed Mobility: Supine to Sit     Supine to sit: Min assist          Transfers Overall transfer level: Needs assistance Equipment used: 2 person hand held assist Transfers: Sit to/from Stand Sit to Stand: +2 physical assistance;Mod assist (Mod A +2 from toilet; Min A +2 from bed)              Balance Overall balance assessment: Needs assistance   Sitting balance-[name]: Good         Standing balance comment: reliant on external support                           ADL either performed or assessed with clinical judgement   ADL Overall ADL's : Needs assistance/impaired      Grooming: Set up;Sitting   Upper Body Bathing: Set up;Sitting   Lower Body Bathing: Sit to/from stand;Minimal assistance   Upper Body Dressing : Set up;Sitting   Lower Body Dressing: Moderate assistance   Toilet Transfer: Minimal assistance   Toileting- Clothing Manipulation and Hygiene: Maximal assistance Toileting - Clothing Manipulation Details (indicate cue type and reason): uses a toilet want at baseline     Functional mobility during ADLs: Minimal assistance;+2 for physical assistance  Will benefit form education on use of AE for LB ADL       Vision Baseline Vision/History: No visual deficits       Perception     Praxis      Pertinent Vitals/Pain Pain Assessment: No/denies pain     Hand Dominance Right   Extremity/Trunk Assessment Upper Extremity Assessment Upper Extremity Assessment: Overall WFL for tasks assessed       Cervical / Trunk Assessment Cervical / Trunk Assessment: Other exceptions   Communication Communication Communication: No difficulties   Cognition   Behavior During Therapy: WFL for tasks assessed/performed                                       General Comments  RA SpO2 87 without activity;  88 on 2L; SpO2 89-95 on 3L    Exercises Exercises: Other exercises Other Exercises Other Exercises: pursed lip breathing   Shoulder Instructions      Home Living Family/patient expects to be discharged to:: Private residence Living Arrangements: Children Available Help at Discharge: Available PRN/intermittently Type of Home: House Home Access: Stairs to enter [employer] of Steps: 1   Home Layout: Able to live on main level with bedroom/bathroom     Bathroom Shower/Tub: Occupational psychologist: Handicapped height Bathroom Accessibility: Yes How Accessible: Accessible via walker Home Equipment: [place] - 2 wheels;Bedside commode          Prior Functioning/Environment Level of  Independence: Independent;Independent with assistive device(s) (uses AE for hygiene and LB ADL;difficulty with bathing and dressing due to body habitus)        Comments: drives;        OT Problem List: Decreased strength;Decreased activity tolerance;Impaired balance (sitting and/or standing);Decreased safety awareness;Decreased knowledge of use of DME or AE;Cardiopulmonary status limiting activity;Obesity      OT Treatment/Interventions: Self-care/ADL training;Therapeutic exercise;Energy conservation;DME and/or AE instruction;Therapeutic activities;Patient/family education;Balance training    OT Goals(Current goals can be found in the care plan section) Acute Rehab OT Goals Patient Stated Goal: to be able to take care of herself and return to work OT Goal Formulation: With patient Time For Goal Achievement: 09/06/20 Potential to Achieve Goals: Good  OT Frequency: Min 2X/week   Barriers to D/C:            Co-evaluation PT/OT/SLP Co-Evaluation/Treatment: Yes Reason for Co-Treatment: For patient/therapist safety   OT goals addressed during session: ADL's and self-care      AM-PAC OT "6 Clicks" Daily Activity     Outcome Measure Help from another person eating meals?: None Help from another person taking care of personal grooming?: A [place] Help from another person toileting, which includes using toliet, bedpan, or urinal?: A Lot Help from another person bathing (including washing, rinsing, drying)?: A Lot Help from another person to put on and taking off regular upper body clothing?: A [place] Help from another person to put on and taking off regular lower body clothing?: A Lot 6 Click Score: 16   End of Session Equipment Utilized During Treatment: Oxygen (2-3L) Nurse Communication: Mobility status  Activity Tolerance: Patient tolerated treatment well Patient left: in chair;with call bell/phone within reach  OT Visit Diagnosis: Unsteadiness on feet (R26.81);Other  abnormalities of gait and mobility (R26.89);Muscle weakness (generalized) (M62.81)                Time: 4098-1191 OT Time Calculation (min): 40 min Charges:  OT General Charges $OT Visit: 1 Visit OT Evaluation $OT Eval Moderate Complexity: 1 Mod OT Treatments $Self Care/Home Management : 8-22 mins  Maurie Boettcher, OT/L   Acute OT Clinical Specialist [place] Pager [phone] Office [phone]   [facility] 08/23/2020, 10:54 AM

## 2020-08-23 NOTE — Progress Notes (Signed)
Pt taken to new room 2W20 with belongings including phone, charger, flowers, vaseline, and clothing. No complaints at this time. VSS. Report given to [employer] who met [place] at the bedside on arrival.

## 2020-08-23 NOTE — Evaluation (Signed)
Physical Therapy Evaluation Patient Details Name: Angel Brewer MRN: [id number] DOB: 1965-10-17 Today's Date: 08/23/2020   History of Present Illness  55 yo admitted 2/5 with SOB, hypoxia and acute on chronic hypercarbic respiratory failure requiring Bipap. Home CPAP mask hasn't been fitting and pt was not wearing due to awaiting mask refitting. PMhx: obesity, OSA on home CPAP, DVT  Clinical Impression  Pt is normally independent working at [employer] and lives with son. Pt does have RW at home and reports having to get into shower to clean up after toileting for BM. Pt with decreased ROM due to body habitus and limited by generalized weakness, decreased activity tolerance and impaired cardiopulmonary function. Pt will benefit from acute therapy to maximize mobility, safety and function to decrease burden of care. Pt able to walk to and from toilet today with bil HHA and will benefit from bari RW for next session as well as continued mobility. Pt requires supplemental oxygen at all times with desaturation to 87% on RA at rest and even on 3L sats 88-95% with activity. Encouraged OOB daily with nursing staff and will continue to follow.      Follow Up Recommendations Home health PT    Equipment Recommendations  3in1 (PT) (bariatric)    Recommendations for Other Services       Precautions / Restrictions Precautions Precautions: Fall;Other (comment) Precaution Comments: watch sats      Mobility  Bed Mobility Overal bed mobility: Needs Assistance Bed Mobility: Supine to Sit     Supine to sit: Min assist;HOB elevated     General bed mobility comments: HOB 35 degrees with min assist to fully elevate trunk, increased time to pivot to left side of bed    Transfers Overall transfer level: Needs assistance Equipment used: 2 person hand held assist Transfers: Sit to/from Stand Sit to Stand: +2 physical assistance;Mod assist;Min assist         General transfer comment: min +2 from  bed, mod +2 from low toilet with cues for hand placement and assist for anterior translation  Ambulation/Gait Ambulation/Gait assistance: Min assist;+2 physical assistance Gait Distance (Feet): 15 Feet Assistive device: 2 person hand held assist Gait Pattern/deviations: Step-through pattern;Decreased stride length;Wide base of support   Gait velocity interpretation: <1.8 ft/sec, indicate of risk for recurrent falls General Gait Details: pt with wide BOS with bil HHA to walk from bed to toilet and back with seated rest at toilet. Pt with desaturation to 87% with standing on 2L and returned to 3L with sats 88-95% during activity  Stairs            Wheelchair Mobility    Modified Rankin (Stroke Patients Only)       Balance Overall balance assessment: Needs assistance Sitting-balance support: No upper extremity supported Sitting balance-[name]: Good Sitting balance - Comments: able to sit EOB and at toilet without UE support   Standing balance support: Bilateral upper extremity supported   Standing balance comment: external support of bil UE for standing and mobility                             Pertinent Vitals/Pain Pain Assessment: No/denies pain    Home Living Family/patient expects to be discharged to:: Private residence Living Arrangements: Children Available Help at Discharge: Available PRN/intermittently Type of Home: House Home Access: Stairs to enter   [employer] of Steps: 1 Home Layout: Able to live on main level with bedroom/bathroom Home  Equipment: Gilford Rile - 2 wheels;Bedside commode      Prior Function Level of Independence: Independent;Independent with assistive device(s) (uses AE for hygiene and LB ADL;difficulty with bathing and dressing due to body habitus)         Comments: drives; works at [employer] at [place]: Right    Extremity/Trunk Assessment   Upper Extremity  Assessment Upper Extremity Assessment: Defer to OT evaluation    Lower Extremity Assessment Lower Extremity Assessment: Generalized weakness (ROM limited by body habitus)    Cervical / Trunk Assessment Cervical / Trunk Assessment: Other exceptions Cervical / Trunk Exceptions: obesity with large sacral shelf  Communication   Communication: No difficulties  Cognition Arousal/Alertness: Awake/alert Behavior During Therapy: WFL for tasks assessed/performed Overall Cognitive Status: Within Functional Limits for tasks assessed                                        General Comments General comments (skin integrity, edema, etc.): RA SpO2 87 without activity; 88 on 2L; SpO2 89-95 on 3L    Exercises Other Exercises Other Exercises: pursed lip breathing   Assessment/Plan    PT Assessment Patient needs continued PT services  PT Problem List Decreased mobility;Decreased activity tolerance;Decreased balance;Decreased knowledge of use of DME;Cardiopulmonary status limiting activity       PT Treatment Interventions DME instruction;Therapeutic exercise;Gait training;Stair training;Functional mobility training;Therapeutic activities;Patient/family education;Balance training    PT Goals (Current goals can be found in the Care Plan section)  Acute Rehab PT Goals Patient Stated Goal: return home and to work PT Goal Formulation: With patient Time For Goal Achievement: 09/06/20 Potential to Achieve Goals: Fair    Frequency Min 3X/week   Barriers to discharge Decreased caregiver support      Co-evaluation PT/OT/SLP Co-Evaluation/Treatment: Yes Reason for Co-Treatment: Complexity of the patient's impairments (multi-system involvement);For patient/therapist safety PT goals addressed during session: Mobility/safety with mobility OT goals addressed during session: ADL's and self-care       AM-PAC PT "6 Clicks" Mobility  Outcome Measure Help needed turning from your back  to your side while in a flat bed without using bedrails?: A Kihn Help needed moving from lying on your back to sitting on the side of a flat bed without using bedrails?: A Felker Help needed moving to and from a bed to a chair (including a wheelchair)?: A Wojtowicz Help needed standing up from a chair using your arms (e.g., wheelchair or bedside chair)?: A Lot Help needed to walk in hospital room?: A Fair Help needed climbing 3-5 steps with a railing? : A Lot 6 Click Score: 16    End of Session   Activity Tolerance: Patient limited by fatigue Patient left: in chair;with call bell/phone within reach (no chair alarms on unit and RN aware not present) Nurse Communication: Mobility status PT Visit Diagnosis: Other abnormalities of gait and mobility (R26.89);Difficulty in walking, not elsewhere classified (R26.2)    Time: 0349-1791 PT Time Calculation (min) (ACUTE ONLY): 33 min   Charges:   PT Evaluation $PT Eval Moderate Complexity: 1 Mod          [place], PT Acute Rehabilitation Services Pager: [phone] Office: [phone]   Sandy Salaam Jenkins Risdon 08/23/2020, 12:59 PM

## 2020-08-23 NOTE — Progress Notes (Signed)
PROGRESS NOTE    Kassie Missildine  WGN:[id number] DOB: [birth year] DOA: 08/18/2020 PCP: [name], NP  Brief Narrative:  Patient is a super morbidly obese African-American 55 year old female with a past medical history significant for provoked DVT, history of class III obesity, OSA with intermittent CPAP compliance, as well as other comorbidities who presented with acute on chronic shortness of breath.  In the ED work-up showed hypoxemia, CTA chest was benign.  She developed progressive lethargy in the ER so code stroke was called.  ABG showed severe acute on chronic hypercarbia and PCCM was consulted.  Daughter was on the provided most of the history at bedside as patient was somnolent.  Patient's CPAP mask has not been fitting her well and she has not been wearing it and trying to get into a DME for mask refitting but having issues with this.  She was placed on BiPAP in the ED with improvement her mentation but required a good deal pressures to get appropriate volumes.  She is stabilized by critical care and was weaned off of her BiPAP to supplemental oxygen via nasal cannula.  She continues with supplemental oxygen currently and requires BiPAP at night.  Her encephalopathy is improved and she was transferred to [facility] service on 08/22/2020.  Respiratory status is improving a Lyn but still requires supplemental oxygen via nasal cannula and so we will give her another dose IV diuretics and reevaluate in the a.m.  Assessment & Plan:   Principal Problem:   Chronic respiratory failure with hypoxia (HCC) Active Problems:   Morbid obesity ([place])   Essential hypertension   OSA on CPAP   Pulmonary hypertension (HCC)   Respiratory failure (HCC)  Acute on Chronic Hypercarbic Respiratory Failure Acute Respiratory Failure with Hypoxia -presumably due to BiPAP non-adherence but most likely in the setting of OHS/OSA overlap syndrome, groups 2-3 pulmonary hypertension and poor compliance with BiPAP.Possible  volume overload, habitus limits evaluation.  -ABG Labs on Last check     Component Value Date/Time   PHART 7.293 (L) 08/20/2020 0912   PCO2ART 86.7 (HH) 08/20/2020 0912   PO2ART 73 (L) 08/20/2020 0912   HCO3 42.2 (H) 08/20/2020 0912   TCO2 45 (H) 08/20/2020 0912   ACIDBASEDEF 3.0 (H) 11/22/2019 1119   O2SAT 92.0 08/20/2020 0912   -BNP WNL.Volume felt less likely culprit on Admisison and likely worsened with new supplemental oxygen reducing her respiratory drive -Diuresis now stopped given that she is net negative 3.2648Liters  -Continue BIPAP EVERY NIGHT, referral to sleep medicine -Does not wear Supplemental O2 at home but desarur  -He continues to be improved with better ventilation and she is awake and alert and pulmonary has referred see if DME mask refitting is needed she will need to be seen by Dr. Chesley Mires, Kara Mead or Dr. Ander Slade; she really needs another OPO and/or a CPAP titration study. -C/w Flutter Valve, Incentive Spirometry, and Guaifenesin -Will check CXR today and showed "Improved ventilation and largely resolved interstitial edema. 2. Stable cardiomegaly.  No new cardiopulmonary abnormality." -Will give another 1x of of IV Lasix today  -Echocardiogram showed EF of 60 to 65% with indeterminate diastolic function. -Continue with Albuterol 2 puffs IH every 4 as needed for wheezing shortness of breath -Will need PT/OT to evaluate and Treat and Ambulatory Home O2 Screen and she needs Home Health PT/OT and Intermittent supervision with mobility and assist for ADL and desaturates on Room Air so will need Home O2 as she requires desaturates to 87% on  RA at Rest   Acute Hypercarbic Encephalopathy -Due to hypercarbia, improved on BiPAP. Stably improved off BiPAP   Leukopenia -Mild with a WBC of 3.5 and improved to 5.0 now  -continue monitor and trend repeat CBC in a.m.   History of VTE -Resume Apixaban 2/8   OSA -C/w BiPAP qHS and PRN  Normocytic  Anemia -Patient's Lat Hgb/Hct was 11.6/34.0 and now was 12.7/39.6 yesterday and today it is 12.3/40.0 -Checked Anemia Panel and showed an Iron level of 40, U IBC of 279, TIBC of 319, saturation ratios of 13%, ferritin level of 142, folate level of 13.4, vitamin B12 of 107 -Will start Vitamin B12 Supplementation and give a 1x dose of IM Cyanocobalamin 1000 mcg today and start po in the AM -Continue to Monitor for S/Sx of Bleeding  -Repeat CBC in the AM   Super Morbid Obesity -Complicates overall prognosis and care -Estimated body mass index is 62.78 kg/m as calculated from the following:   Height as of this encounter: _0  (1.727 m).   Weight as of this encounter: 187.3 kg. -Weight Loss and Dietary Counseling given   Constipation -We will add bowel regimen and start docusate senna docusate and continue MiraLAX 17 g daily.  If necessary will provide a suppository versus enema   Elevated BUN -Mild with a BUN of 21 -Continue to Monitor for S/Sx of upper GI Bleeding -Currently not on Steroids -Continue to Monitor and Trend -Repeat CMP in the AM   DVT prophylaxis: Anticoagulated with Apixaban 5 mg po BID  Code Status: FULL CODE  Family Communication: No family present at bedside  Disposition Plan: Home with Home Health in the next 24-48 hours   Status is: Inpatient  Remains inpatient appropriate because:Unsafe d/c plan, IV treatments appropriate due to intensity of illness or inability to take PO and Inpatient level of care appropriate due to severity of illness   Dispo: The patient is from: Home              Anticipated d/c is to: Home              Anticipated d/c date is: 1-2 days              Patient currently is not medically stable to d/c.   Difficult to place patient No  Consultants:   PCCM Transfer    Procedures:  ECHOCARDIOGRAM IMPRESSIONS    1. Left ventricular ejection fraction, by estimation, is 60 to 65%. The  left ventricle has normal function. The left  ventricle has no regional  wall motion abnormalities. There is mild left ventricular hypertrophy.  Left ventricular diastolic function  could not be evaluated.  2. Right ventricular systolic function was not well visualized. The right  ventricular size is not well visualized.  3. The mitral valve was not assessed. No evidence of mitral valve  regurgitation.  4. The aortic valve was not well visualized. Aortic valve regurgitation  is not visualized.   Comparison(s): Incomplete echo - patient emergently during the echo to CT  for possible stroke. Echo windows are poor- consider limited follow-up  echo with Definity contrast if necessary.   FINDINGS  Left Ventricle: Left ventricular ejection fraction, by estimation, is 60  to 65%. The left ventricle has normal function. The left ventricle has no  regional wall motion abnormalities. The left ventricular internal cavity  size was normal in size. There is  mild left ventricular hypertrophy. Left ventricular diastolic function  could not be evaluated  due to indeterminate diastolic function. Left  ventricular diastolic function could not be evaluated.   Right Ventricle: The right ventricular size is not well visualized. Right  vetricular wall thickness was not assessed. Right ventricular systolic  function was not well visualized.   Left Atrium: Left atrial size was normal in size.   Right Atrium: Right atrial size was normal in size.   Pericardium: There is no evidence of pericardial effusion.   Mitral Valve: The mitral valve was not assessed. No evidence of mitral  valve regurgitation.   Tricuspid Valve: The tricuspid valve is not assessed. Tricuspid valve  regurgitation is not demonstrated.   Aortic Valve: The aortic valve was not well visualized. Aortic valve  regurgitation is not visualized.   Pulmonic Valve: The pulmonic valve was grossly normal. Pulmonic valve  regurgitation is trivial.   Aorta: The aortic root was  not well visualized.   IAS/Shunts: No atrial level shunt detected by color flow Doppler.     LEFT VENTRICLE  PLAX 2D  LVIDd:     4.30 cm  LVIDs:     3.20 cm  LV PW:     1.20 cm  LV IVS:    1.30 cm  LVOT diam:   2.60 cm  LVOT Area:   5.31 cm     LEFT ATRIUM      Index  LA diam:   3.50 cm 1.27 cm/m  LA Vol (A4C): 67.6 ml 24.56 ml/m    AORTA  Ao Root diam: 3.40 cm     SHUNTS  Systemic Diam: 2.60 cm   Antimicrobials:  Anti-infectives (From admission, onward)   None        Subjective: Seen and examined at bedside felt a Zipp bit better.  Not as short of breath.  States that she had a bowel movement.  No nausea or vomiting.  Still feels very weak.  Wanted to get out of bed.  Denies any lightheadedness or dizziness.  No other concerns or complaints at this time.  Objective: Vitals:   08/23/20 0400 08/23/20 0500 08/23/20 0600 08/23/20 0700  BP: 133/82 (!) 148/87 122/73 123/72  Pulse: (!) 54 63 79 (!) 55  Resp: 18 18 (!) 25 18  Temp:    98.2 F (36.8 C)  TempSrc:    Oral  SpO2: 96% 98% 95% 96%  Weight:      Height:        Intake/Output Summary (Last 24 hours) at 08/23/2020 0750 Last data filed at 08/23/2020 0600 Gross per 24 hour  Intake 360 ml  Output 1800 ml  Net -1440 ml   Filed Weights   08/20/20 0647  Weight: (!) 187.3 kg    Examination: Physical Exam:  Constitutional: WN/WD super morbidly obese African-American female currently in no acute distress appears calm Eyes: Lids and conjunctivae normal, sclerae anicteric  ENMT: External Ears, Nose appear normal. Grossly normal hearing. Neck: Appears normal, supple, no cervical masses, normal ROM, no appreciable thyromegaly; no JVD Respiratory: Diminished to auscultation bilaterally with coarse breath sounds, no wheezing, rales, rhonchi or crackles. Normal respiratory effort and patient is not tachypenic. No accessory muscle use. Wearing supplemental oxygen via nasal  cannula Cardiovascular: RRR, no murmurs / rubs / gallops. S1 and S2 auscultated. As trace lower extremity edema Abdomen: Soft, non-tender, distended secondary body habitus. Bowel sounds positive.  GU: Deferred. Musculoskeletal: No clubbing / cyanosis of digits/nails. No joint deformity upper and lower extremities.  Skin: No rashes, lesions, ulcers on limited skin evaluation.  No induration; Warm and dry.  Neurologic: CN 2-12 grossly intact with no focal deficits. Romberg sign and cerebellar reflexes not assessed.  Psychiatric: Normal judgment and insight. Alert and oriented x 3. Normal mood and appropriate affect.   Data Reviewed: I have personally reviewed following labs and imaging studies  CBC: Recent Labs  Lab 08/18/20 0246 08/19/20 0323 08/20/20 0437 08/20/20 0606 08/20/20 0912 08/22/20 0931 08/23/20 0036  WBC 4.6  --   --  4.6  --  3.5* 5.0  NEUTROABS  --   --   --  3.3  --  1.9 3.2  HGB 13.0   < > 12.6 10.9* 11.6* 12.7 12.3  HCT 41.9   < > 37.0 37.7 34.0* 39.6 40.0  MCV 95.7  --   --  100.0  --  93.4 93.2  PLT 226  --   --  161  --  174 181   < > = values in this interval not displayed.   Basic Metabolic Panel: Recent Labs  Lab 08/18/20 0246 08/19/20 0323 08/19/20 0439 08/20/20 0437 08/20/20 0439 08/20/20 0912 08/22/20 0931 08/23/20 0036  NA 141   < > 142 142 140 140 140 138  K 4.2   < > 4.8 4.5 4.4 4.3 4.3 4.2  CL 101  --  101  --  98  --  94* 96*  CO2 30  --  33*  --  31  --  37* 31  GLUCOSE 78  --  93  --  207*  --  83 100*  BUN 15  --  11  --  14  --  18 21*  CREATININE 1.02*  --  0.94  --  0.93  --  1.00 0.89  CALCIUM 9.0  --  9.0  --  8.8*  --  9.5 9.3  MG  --   --   --   --   --   --  1.9 2.0  PHOS  --   --   --   --   --   --  3.6 4.3   < > = values in this interval not displayed.   GFR: Estimated Creatinine Clearance: 127.7 mL/min (by C-G formula based on SCr of 0.89 mg/dL). Liver Function Tests: Recent Labs  Lab 08/22/20 0931 08/23/20 0036   AST 16 13*  ALT 10 9  ALKPHOS 48 49  BILITOT 1.0 0.9  PROT 7.4 7.5  ALBUMIN 3.3* 3.2*   No results for input(s): LIPASE, AMYLASE in the last 168 hours. No results for input(s): AMMONIA in the last 168 hours. Coagulation Profile: No results for input(s): INR, PROTIME in the last 168 hours. Cardiac Enzymes: No results for input(s): CKTOTAL, CKMB, CKMBINDEX, TROPONINI in the last 168 hours. BNP (last 3 results) No results for input(s): PROBNP in the last 8760 hours. HbA1C: No results for input(s): HGBA1C in the last 72 hours. CBG: Recent Labs  Lab 08/22/20 1531 08/22/20 1921 08/22/20 2320 08/23/20 0318 08/23/20 0724  GLUCAP 96 80 107* 94 79   Lipid Profile: No results for input(s): CHOL, HDL, LDLCALC, TRIG, CHOLHDL, LDLDIRECT in the last 72 hours. Thyroid Function Tests: No results for input(s): TSH, T4TOTAL, FREET4, T3FREE, THYROIDAB in the last 72 hours. Anemia Panel: Recent Labs    08/23/20 0036  VITAMINB12 107*  FOLATE 13.4  FERRITIN 142  TIBC 319  IRON 40  RETICCTPCT 2.2   Sepsis Labs: No results for input(s): PROCALCITON, LATICACIDVEN in the last 168 hours.  Recent Results (from the past 240 hour(s))  SARS Coronavirus 2 by RT PCR (hospital order, performed in [facility] hospital lab) Nasopharyngeal Nasopharyngeal Swab     Status: None   Collection Time: 08/18/20  6:21 PM   Specimen: Nasopharyngeal Swab  Result Value Ref Range Status   SARS Coronavirus 2 NEGATIVE NEGATIVE Final    Comment: (NOTE) SARS-CoV-2 target nucleic acids are NOT DETECTED.  The SARS-CoV-2 RNA is generally detectable in upper and lower respiratory specimens during the acute phase of infection. The lowest concentration of SARS-CoV-2 viral copies this assay can detect is 250 copies / mL. A negative result does not preclude SARS-CoV-2 infection and should not be used as the sole basis for treatment or other patient management decisions.  A negative result may occur with improper  specimen collection / handling, submission of specimen other than nasopharyngeal swab, presence of viral mutation(s) within the areas targeted by this assay, and inadequate number of viral copies (<250 copies / mL). A negative result must be combined with clinical observations, patient history, and epidemiological information.  Fact Sheet for Patients:   StrictlyIdeas.no  Fact Sheet for Healthcare Providers: BankingDealers.co.za  This test is not yet approved or  cleared by the [place] FDA and has been authorized for detection and/or diagnosis of SARS-CoV-2 by FDA under an Emergency Use Authorization (EUA).  This EUA will remain in effect (meaning this test can be used) for the duration of the COVID-19 declaration under Section 564(b)(1) of the Act, 21 U.S.C. section 360bbb-3(b)(1), unless the authorization is terminated or revoked sooner.  Performed at [facility], [place] [address], [place], [place] [address]   MRSA PCR Screening     Status: None   Collection Time: 08/19/20 10:33 PM   Specimen: Nasal Mucosa; Nasopharyngeal  Result Value Ref Range Status   MRSA by PCR NEGATIVE NEGATIVE Final    Comment:        The GeneXpert MRSA Assay (FDA approved for NASAL specimens only), is one component of a comprehensive MRSA colonization surveillance program. It is not intended to diagnose MRSA infection nor to guide or monitor treatment for MRSA infections. Performed at [facility], [place] [address], [place], [place] [address]      RN Pressure Injury Documentation:     Estimated body mass index is 62.78 kg/m as calculated from the following:   Height as of this encounter: _0  (1.727 m).   Weight as of this encounter: 187.3 kg.  Malnutrition Type:   Malnutrition Characteristics:   Nutrition Interventions:     Radiology Studies: DG CHEST PORT 1 VIEW  Result Date: 08/23/2020 CLINICAL DATA:  55 year old  female with shortness of breath. Cardiomegaly. EXAM: PORTABLE CHEST 1 VIEW COMPARISON:  Portable chest 08/21/2020 and earlier. FINDINGS: Portable AP semi upright view at 0344 hours. Mildly improved lung volumes. Stable cardiomegaly and mediastinal contours. Decreased bilateral pulmonary vascularity/interstitial opacity. No pneumothorax, pleural effusion or consolidation. Negative visible bowel gas and osseous structures. IMPRESSION: 1. Improved ventilation and largely resolved interstitial edema. 2. Stable cardiomegaly.  No new cardiopulmonary abnormality. Electronically Signed   By: Genevie Ann M.D.   On: 08/23/2020 06:07   Scheduled Meds: . apixaban  5 mg Oral BID  . chlorhexidine  15 mL Mouth Rinse BID  . Chlorhexidine Gluconate Cloth  6 each Topical Daily  . mouth rinse  15 mL Mouth Rinse q12n4p  . polyethylene glycol  17 g Oral Daily  . senna-docusate  1 tablet Oral  BID  . sodium chloride flush  3 mL Intravenous Q12H   Continuous Infusions: . sodium chloride       LOS: 4 days   Kerney Elbe, DO [facility] PAGER is on AMION  If 7PM-7AM, please contact night-coverage [email or web address]

## 2020-08-24 ENCOUNTER — Telehealth: Payer: Self-pay | Admitting: Cardiology

## 2020-08-24 ENCOUNTER — Inpatient Hospital Stay (HOSPITAL_COMMUNITY): Payer: Medicaid Other

## 2020-08-24 DIAGNOSIS — J9611 Chronic respiratory failure with hypoxia: Secondary | ICD-10-CM | POA: Diagnosis not present

## 2020-08-24 DIAGNOSIS — K5900 Constipation, unspecified: Secondary | ICD-10-CM | POA: Diagnosis not present

## 2020-08-24 DIAGNOSIS — J9622 Acute and chronic respiratory failure with hypercapnia: Secondary | ICD-10-CM | POA: Diagnosis not present

## 2020-08-24 DIAGNOSIS — R0602 Shortness of breath: Secondary | ICD-10-CM | POA: Diagnosis not present

## 2020-08-24 DIAGNOSIS — D649 Anemia, unspecified: Secondary | ICD-10-CM | POA: Diagnosis not present

## 2020-08-24 DIAGNOSIS — G4733 Obstructive sleep apnea (adult) (pediatric): Secondary | ICD-10-CM | POA: Diagnosis not present

## 2020-08-24 DIAGNOSIS — Z9989 Dependence on other enabling machines and devices: Secondary | ICD-10-CM | POA: Diagnosis not present

## 2020-08-24 DIAGNOSIS — J9621 Acute and chronic respiratory failure with hypoxia: Secondary | ICD-10-CM | POA: Diagnosis not present

## 2020-08-24 DIAGNOSIS — I517 Cardiomegaly: Secondary | ICD-10-CM | POA: Diagnosis not present

## 2020-08-24 DIAGNOSIS — I10 Essential (primary) hypertension: Secondary | ICD-10-CM | POA: Diagnosis not present

## 2020-08-24 DIAGNOSIS — I272 Pulmonary hypertension, unspecified: Secondary | ICD-10-CM | POA: Diagnosis not present

## 2020-08-24 LAB — COMPREHENSIVE METABOLIC PANEL
ALT: 7 U/L (ref 0–44)
AST: 18 U/L (ref 15–41)
Albumin: 3.1 g/dL — ABNORMAL LOW (ref 3.5–5.0)
Alkaline Phosphatase: 41 U/L (ref 38–126)
Anion gap: 8 (ref 5–15)
BUN: 21 mg/dL — ABNORMAL HIGH (ref 6–20)
CO2: 33 mmol/L — ABNORMAL HIGH (ref 22–32)
Calcium: 9.3 mg/dL (ref 8.9–10.3)
Chloride: 97 mmol/L — ABNORMAL LOW (ref 98–111)
Creatinine, Ser: 0.91 mg/dL (ref 0.44–1.00)
GFR, Estimated: >60 mL/min (ref >60)
Glucose, Bld: 85 mg/dL (ref 70–99)
Potassium: 4.1 mmol/L (ref 3.5–5.1)
Sodium: 138 mmol/L (ref 135–145)
Total Bilirubin: 1.4 mg/dL — ABNORMAL HIGH (ref 0.3–1.2)
Total Protein: 6.8 g/dL (ref 6.5–8.1)

## 2020-08-24 LAB — CBC WITH DIFFERENTIAL/PLATELET
Abs Immature Granulocytes: 0.03 10*3/uL (ref 0.00–0.07)
Basophils Absolute: 0 10*3/uL (ref 0.0–0.1)
Basophils Relative: 1 %
Eosinophils Absolute: 0.2 10*3/uL (ref 0.0–0.5)
Eosinophils Relative: 3 %
HCT: 37.5 % (ref 36.0–46.0)
Hemoglobin: 11.6 g/dL — ABNORMAL LOW (ref 12.0–15.0)
Immature Granulocytes: 1 %
Lymphocytes Relative: 23 %
Lymphs Abs: 1 10*3/uL (ref 0.7–4.0)
MCH: 29.2 pg (ref 26.0–34.0)
MCHC: 30.9 g/dL (ref 30.0–36.0)
MCV: 94.5 fL (ref 80.0–100.0)
Monocytes Absolute: 0.6 10*3/uL (ref 0.1–1.0)
Monocytes Relative: 14 %
Neutro Abs: 2.5 10*3/uL (ref 1.7–7.7)
Neutrophils Relative %: 58 %
Platelets: 170 10*3/uL (ref 150–400)
RBC: 3.97 MIL/uL (ref 3.87–5.11)
RDW: 13.2 % (ref 11.5–15.5)
WBC: 4.4 10*3/uL (ref 4.0–10.5)
nRBC: 0 % (ref 0.0–0.2)

## 2020-08-24 LAB — GLUCOSE, CAPILLARY
Glucose-Capillary: 89 mg/dL (ref 70–99)
Glucose-Capillary: 109 mg/dL — ABNORMAL HIGH (ref 70–99)
Glucose-Capillary: 84 mg/dL (ref 70–99)
Glucose-Capillary: 79 mg/dL (ref 70–99)
Glucose-Capillary: 87 mg/dL (ref 70–99)

## 2020-08-24 LAB — PHOSPHORUS: Phosphorus: 3.9 mg/dL (ref 2.5–4.6)

## 2020-08-24 LAB — MAGNESIUM: Magnesium: 2 mg/dL (ref 1.7–2.4)

## 2020-08-24 MED ORDER — FUROSEMIDE 10 MG/ML IJ SOLN
40.0000 mg | Freq: Once | INTRAMUSCULAR | Status: AC
  Administered 2020-08-24: 40 mg via INTRAVENOUS
  Filled 2020-08-24: qty 4

## 2020-08-24 NOTE — Progress Notes (Addendum)
PROGRESS NOTE    Angel Brewer  IZT:[id number] DOB: 1965-12-24 DOA: 08/18/2020 PCP: [name], NP  Brief Narrative:  Patient is a super morbidly obese African-American 55 year old female with a past medical history significant for provoked DVT, history of class III obesity, OSA with intermittent CPAP compliance, as well as other comorbidities who presented with acute on chronic shortness of breath.  In the ED work-up showed hypoxemia, CTA chest was benign.  She developed progressive lethargy in the ER so code stroke was called.  ABG showed severe acute on chronic hypercarbia and PCCM was consulted.  Daughter was on the provided most of the history at bedside as patient was somnolent.  Patient's CPAP mask has not been fitting her well and she has not been wearing it and trying to get into a DME for mask refitting but having issues with this.  She was placed on BiPAP in the ED with improvement her mentation but required a good deal pressures to get appropriate volumes.  She is stabilized by critical care and was weaned off of her BiPAP to supplemental oxygen via nasal cannula.  She continues with supplemental oxygen currently and requires BiPAP at night.  Her encephalopathy is improved and she was transferred to [facility] service on 08/22/2020.  Respiratory status is improving a [place] but still requires supplemental oxygen via nasal cannula and so we will give her another dose IV diuretics again today. She received IV 40 mg of Lasix yesterday and will receive it again today.   Assessment & Plan:   Principal Problem:   Chronic respiratory failure with hypoxia (HCC) Active Problems:   Morbid obesity ([place])   Essential hypertension   OSA on CPAP   Pulmonary hypertension (HCC)   Respiratory failure (HCC)  Acute on Chronic Hypercarbic Respiratory Failure Acute Respiratory Failure with Hypoxia, improving  -presumably due to BiPAP non-adherence but most likely in the setting of OHS/OSA overlap syndrome,  groups 2-3 pulmonary hypertension and poor compliance with BiPAP.Possible volume overload, habitus limits evaluation.  -ABG Labs on Last check     Component Value Date/Time   PHART 7.293 (L) 08/20/2020 0912   PCO2ART 86.7 (HH) 08/20/2020 0912   PO2ART 73 (L) 08/20/2020 0912   HCO3 42.2 (H) 08/20/2020 0912   TCO2 45 (H) 08/20/2020 0912   ACIDBASEDEF 3.0 (H) 11/22/2019 1119   O2SAT 92.0 08/20/2020 0912   -BNP WNL.Volume felt less likely culprit on [place] and likely worsened with new supplemental oxygen reducing her respiratory drive but could definitely could be contributing  -Diuresis had stopped given that she is net negative but resumed again and she is -3.0278Liters  -Continue BIPAP EVERY NIGHT, referral to sleep medicine -Does not wear Supplemental O2 at home but did Desaturate on Ambulation today and was able to not wear O2 on Room Air  -He continues to be improved with better ventilation and she is awake and alert and pulmonary has referred see if DME mask refitting is needed she will need to be seen by Dr. Chesley Mires, Kara Mead or Dr. Ander Slade; she really needs another OPO and/or a CPAP titration study. -C/w Flutter Valve, Incentive Spirometry, and Guaifenesin -Will check CXR today and showed "Cardiomegaly.  No acute cardiopulmonary abnormality.." -Will give another 1x of of IV Lasix 40 mg today and consider giving another one this evening  -Echocardiogram showed EF of 60 to 65% with indeterminate diastolic function but suspect has some Heart Failure component  -Continue with Albuterol 2 puffs IH every 4 as needed  for wheezing shortness of breath -Will need PT/OT to evaluate and Treat and Ambulatory Home O2 Screen and she needs Home Health PT/OT and Intermittent supervision with mobility and assist for ADL ; Yesterday she desaturated on Room Air so will need Home O2 as she requires desaturates to 87% on RA at Rest but today was able to keep her Sats up on Room Air; She desaturated  on Ambulation today -Will continue Flutter Valve, Incentive Spirometry,   Acute Hypercarbic Encephalopathy -Due to Hypercarbia, improved on BiPAP. Stable and improved off BiPAP   Leukopenia -Mild with a WBC of 3.5 and improved to 5.0 yesterday and is 4.4 today  -continue monitor and trend repeat CBC in a.m.   History of VTE -Resume Apixaban 2/8   OSA -C/w BiPAP qHS and PRN  Normocytic Anemia -Patient's Lat Hgb/Hct was 11.6/34.0 -> 12.7/39.6 -> 12.3/40.0 -> 11.6/37.5 -Checked Anemia Panel and showed an Iron level of 40, U IBC of 279, TIBC of 319, saturation ratios of 13%, ferritin level of 142, folate level of 13.4, vitamin B12 of 107 -Will start Vitamin B12 Supplementation and give a 1x dose of IM Cyanocobalamin 1000 mcg today and start po in the AM -Continue to Monitor for S/Sx of Bleeding  -Repeat CBC in the AM   Super Morbid Obesity -Complicates overall prognosis and care -Estimated body mass index is 62.78 kg/m as calculated from the following:   Height as of this encounter: _0  (1.727 m).   Weight as of this encounter: 187.3 kg. -Weight Loss and Dietary Counseling given   Hyperbilirubinemia -Patient's T Bili slightly worsened and went from 1.0 -> 0.9 -> 1.4 -Continue to Monitor and Trend -Repeat CMP in the AM   Constipation -We will add bowel regimen and start docusate senna docusate and continue MiraLAX 17 g daily.  If necessary will provide a suppository versus enema   Elevated BUN -Mild with a BUN of 21 again  -Continue to Monitor for S/Sx of upper GI Bleeding -Currently not on Steroids -Continue to Monitor and Trend -Repeat CMP in the AM   DVT prophylaxis: Anticoagulated with Apixaban 5 mg po BID  Code Status: FULL CODE  Family Communication: No family present at bedside  Disposition Plan: Home with Home Health in the next 24-48 hours after another day of Diuresis   Status is: Inpatient  Remains inpatient appropriate because:Unsafe d/c plan, IV  treatments appropriate due to intensity of illness or inability to take PO and Inpatient level of care appropriate due to severity of illness   Dispo: The patient is from: Home              Anticipated d/c is to: Home              Anticipated d/c date is: 1-2 days              Patient currently is not medically stable to d/c.   Difficult to place patient No  Consultants:   PCCM Transfer    Procedures:  ECHOCARDIOGRAM IMPRESSIONS    1. Left ventricular ejection fraction, by estimation, is 60 to 65%. The  left ventricle has normal function. The left ventricle has no regional  wall motion abnormalities. There is mild left ventricular hypertrophy.  Left ventricular diastolic function  could not be evaluated.  2. Right ventricular systolic function was not well visualized. The right  ventricular size is not well visualized.  3. The mitral valve was not assessed. No evidence of mitral valve  regurgitation.  4. The aortic valve was not well visualized. Aortic valve regurgitation  is not visualized.   Comparison(s): Incomplete echo - patient emergently during the echo to CT  for possible stroke. Echo windows are poor- consider limited follow-up  echo with Definity contrast if necessary.   FINDINGS  Left Ventricle: Left ventricular ejection fraction, by estimation, is 60  to 65%. The left ventricle has normal function. The left ventricle has no  regional wall motion abnormalities. The left ventricular internal cavity  size was normal in size. There is  mild left ventricular hypertrophy. Left ventricular diastolic function  could not be evaluated due to indeterminate diastolic function. Left  ventricular diastolic function could not be evaluated.   Right Ventricle: The right ventricular size is not well visualized. Right  vetricular wall thickness was not assessed. Right ventricular systolic  function was not well visualized.   Left Atrium: Left atrial size was normal in  size.   Right Atrium: Right atrial size was normal in size.   Pericardium: There is no evidence of pericardial effusion.   Mitral Valve: The mitral valve was not assessed. No evidence of mitral  valve regurgitation.   Tricuspid Valve: The tricuspid valve is not assessed. Tricuspid valve  regurgitation is not demonstrated.   Aortic Valve: The aortic valve was not well visualized. Aortic valve  regurgitation is not visualized.   Pulmonic Valve: The pulmonic valve was grossly normal. Pulmonic valve  regurgitation is trivial.   Aorta: The aortic root was not well visualized.   IAS/Shunts: No atrial level shunt detected by color flow Doppler.     LEFT VENTRICLE  PLAX 2D  LVIDd:     4.30 cm  LVIDs:     3.20 cm  LV PW:     1.20 cm  LV IVS:    1.30 cm  LVOT diam:   2.60 cm  LVOT Area:   5.31 cm     LEFT ATRIUM      Index  LA diam:   3.50 cm 1.27 cm/m  LA Vol (A4C): 67.6 ml 24.56 ml/m    AORTA  Ao Root diam: 3.40 cm     SHUNTS  Systemic Diam: 2.60 cm   Antimicrobials:  Anti-infectives (From admission, onward)   None        Subjective: Seen and examined at bedside and she is doing a Rubinstein bit better today.  Wanting to get up and work with therapy and walk.  No chest pain, lightheadedness or dizziness.  Still on supplemental oxygen this morning.  Has some leg swelling still.  No other concerns or complaints at this time.  Objective: Vitals:   08/23/20 2326 08/24/20 0404 08/24/20 0800 08/24/20 1156  BP: 122/70 130/72 119/73 129/85  Pulse: 61 61 (!) 53 (!) 57  Resp: _0 Temp: 98.4 F (36.9 C) 98.3 F (36.8 C)  98.4 F (36.9 C)  TempSrc: Axillary Oral  Oral  SpO2: 99% 97% 100% 96%  Weight:      Height:        Intake/Output Summary (Last 24 hours) at 08/24/2020 1514 Last data filed at 08/23/2020 2225 Gross per 24 hour  Intake 237 ml  Output -  Net 237 ml   Filed Weights   08/20/20 0647  Weight: (!) 187.3 kg     Examination: Physical Exam:  Constitutional: WN/WD super morbidly obese African-American female currently no acute distress appears calm Eyes: Lids and conjunctivae normal, sclerae anicteric  ENMT: External  Ears, Nose appear normal. Grossly normal hearing. Neck: Appears normal, supple, no cervical masses, normal ROM, no appreciable thyromegaly; no JVD Respiratory: Diminished to auscultation bilaterally, no wheezing, rales, rhonchi or crackles. Normal respiratory effort and patient is not tachypenic. No accessory muscle use.  Unlabored breathing Cardiovascular: RRR, no murmurs / rubs / gallops. S1 and S2 auscultated.  Has trace extremity edema Abdomen: Soft, non-tender, distended secondary to body habitus. Bowel sounds positive.  GU: Deferred. Musculoskeletal: No clubbing / cyanosis of digits/nails. No joint deformity upper and lower extremities.  Skin: No rashes, lesions, ulcers on limited skin evaluation. No induration; Warm and dry.  Neurologic: CN 2-12 grossly intact with no focal deficits. Romberg sign and cerebellar reflexes not assessed.  Psychiatric: Normal judgment and insight. Alert and oriented x 3. Normal mood and appropriate affect.   Data Reviewed: I have personally reviewed following labs and imaging studies  CBC: Recent Labs  Lab 08/18/20 0246 08/19/20 0323 08/20/20 0606 08/20/20 0912 08/22/20 0931 08/23/20 0036 08/24/20 0257  WBC 4.6  --  4.6  --  3.5* 5.0 4.4  NEUTROABS  --   --  3.3  --  1.9 3.2 2.5  HGB 13.0   < > 10.9* 11.6* 12.7 12.3 11.6*  HCT 41.9   < > 37.7 34.0* 39.6 40.0 37.5  MCV 95.7  --  100.0  --  93.4 93.2 94.5  PLT 226  --  161  --  174 181 170   < > = values in this interval not displayed.   Basic Metabolic Panel: Recent Labs  Lab 08/19/20 0439 08/20/20 0437 08/20/20 0439 08/20/20 0912 08/22/20 0931 08/23/20 0036 08/24/20 0257  NA 142   < > 140 140 140 138 138  K 4.8   < > 4.4 4.3 4.3 4.2 4.1  CL 101  --  98  --  94* 96* 97*  CO2 33*   --  31  --  37* 31 33*  GLUCOSE 93  --  207*  --  83 100* 85  BUN 11  --  14  --  18 21* 21*  CREATININE 0.94  --  0.93  --  1.00 0.89 0.91  CALCIUM 9.0  --  8.8*  --  9.5 9.3 9.3  MG  --   --   --   --  1.9 2.0 2.0  PHOS  --   --   --   --  3.6 4.3 3.9   < > = values in this interval not displayed.   GFR: Estimated Creatinine Clearance: 124.9 mL/min (by C-G formula based on SCr of 0.91 mg/dL). Liver Function Tests: Recent Labs  Lab 08/22/20 0931 08/23/20 0036 08/24/20 0257  AST 16 13* 18  ALT _0 ALKPHOS 48 49 41  BILITOT 1.0 0.9 1.4*  PROT 7.4 7.5 6.8  ALBUMIN 3.3* 3.2* 3.1*   No results for input(s): LIPASE, AMYLASE in the last 168 hours. No results for input(s): AMMONIA in the last 168 hours. Coagulation Profile: No results for input(s): INR, PROTIME in the last 168 hours. Cardiac Enzymes: No results for input(s): CKTOTAL, CKMB, CKMBINDEX, TROPONINI in the last 168 hours. BNP (last 3 results) No results for input(s): PROBNP in the last 8760 hours. HbA1C: No results for input(s): HGBA1C in the last 72 hours. CBG: Recent Labs  Lab 08/23/20 1929 08/23/20 2209 08/24/20 0403 08/24/20 0747 08/24/20 1154  GLUCAP 99 85 89 109* 84   Lipid Profile: No results for input(s): CHOL,  HDL, LDLCALC, TRIG, CHOLHDL, LDLDIRECT in the last 72 hours. Thyroid Function Tests: No results for input(s): TSH, T4TOTAL, FREET4, T3FREE, THYROIDAB in the last 72 hours. Anemia Panel: Recent Labs    08/23/20 0036  VITAMINB12 107*  FOLATE 13.4  FERRITIN 142  TIBC 319  IRON 40  RETICCTPCT 2.2   Sepsis Labs: No results for input(s): PROCALCITON, LATICACIDVEN in the last 168 hours.  Recent Results (from the past 240 hour(s))  SARS Coronavirus 2 by RT PCR (hospital order, performed in [facility] hospital lab) Nasopharyngeal Nasopharyngeal Swab     Status: None   Collection Time: 08/18/20  6:21 PM   Specimen: Nasopharyngeal Swab  Result Value Ref Range Status   SARS Coronavirus 2  NEGATIVE NEGATIVE Final    Comment: (NOTE) SARS-CoV-2 target nucleic acids are NOT DETECTED.  The SARS-CoV-2 RNA is generally detectable in upper and lower respiratory specimens during the acute phase of infection. The lowest concentration of SARS-CoV-2 viral copies this assay can detect is 250 copies / mL. A negative result does not preclude SARS-CoV-2 infection and should not be used as the sole basis for treatment or other patient management decisions.  A negative result may occur with improper specimen collection / handling, submission of specimen other than nasopharyngeal swab, presence of viral mutation(s) within the areas targeted by this assay, and inadequate number of viral copies (<250 copies / mL). A negative result must be combined with clinical observations, patient history, and epidemiological information.  Fact Sheet for Patients:   StrictlyIdeas.no  Fact Sheet for Healthcare Providers: BankingDealers.co.za  This test is not yet approved or  cleared by the [place] FDA and has been authorized for detection and/or diagnosis of SARS-CoV-2 by FDA under an Emergency Use Authorization (EUA).  This EUA will remain in effect (meaning this test can be used) for the duration of the COVID-19 declaration under Section 564(b)(1) of the Act, 21 U.S.C. section 360bbb-3(b)(1), unless the authorization is terminated or revoked sooner.  Performed at [facility], [place] [address], [place], [place] [address]   MRSA PCR Screening     Status: None   Collection Time: 08/19/20 10:33 PM   Specimen: Nasal Mucosa; Nasopharyngeal  Result Value Ref Range Status   MRSA by PCR NEGATIVE NEGATIVE Final    Comment:        The GeneXpert MRSA Assay (FDA approved for NASAL specimens only), is one component of a comprehensive MRSA colonization surveillance program. It is not intended to diagnose MRSA infection nor to guide or monitor  treatment for MRSA infections. Performed at [facility], [place] [address] [place], [place] [address]      RN Pressure Injury Documentation:     Estimated body mass index is 62.78 kg/m as calculated from the following:   Height as of this encounter: _0  (1.727 m).   Weight as of this encounter: 187.3 kg.  Malnutrition Type:   Malnutrition Characteristics:   Nutrition Interventions:     Radiology Studies: DG CHEST PORT 1 VIEW  Result Date: 08/24/2020 CLINICAL DATA:  55 year old female with shortness of breath. EXAM: PORTABLE CHEST 1 VIEW COMPARISON:  Portable chest 08/23/2020 and earlier. FINDINGS: Portable AP semi upright view at 0505 hours. Stable cardiomegaly and mediastinal contours. Visualized tracheal air column is within normal limits. Allowing for portable technique the lungs are clear. No pneumothorax. Negative visible bowel gas and osseous structures. IMPRESSION: Cardiomegaly.  No acute cardiopulmonary abnormality. Electronically Signed   By: Genevie Ann  M.D.   On: 08/24/2020 06:41   DG CHEST PORT 1 VIEW  Result Date: 08/23/2020 CLINICAL DATA:  55 year old female with shortness of breath. Cardiomegaly. EXAM: PORTABLE CHEST 1 VIEW COMPARISON:  Portable chest 08/21/2020 and earlier. FINDINGS: Portable AP semi upright view at 0344 hours. Mildly improved lung volumes. Stable cardiomegaly and mediastinal contours. Decreased bilateral pulmonary vascularity/interstitial opacity. No pneumothorax, pleural effusion or consolidation. Negative visible bowel gas and osseous structures. IMPRESSION: 1. Improved ventilation and largely resolved interstitial edema. 2. Stable cardiomegaly.  No new cardiopulmonary abnormality. Electronically Signed   By: Genevie Ann M.D.   On: 08/23/2020 06:07   Scheduled Meds: . apixaban  5 mg Oral BID  . chlorhexidine  15 mL Mouth Rinse BID  . Chlorhexidine Gluconate Cloth  6 each Topical Daily  . guaiFENesin  1,200 mg Oral BID  . mouth rinse  15 mL Mouth  Rinse q12n4p  . polyethylene glycol  17 g Oral Daily  . senna-docusate  1 tablet Oral BID  . sodium chloride flush  3 mL Intravenous Q12H   Continuous Infusions: . sodium chloride      LOS: 5 days   Kerney Elbe, DO [facility] PAGER is on AMION  If 7PM-7AM, please contact night-coverage [email or web address]

## 2020-08-24 NOTE — Plan of Care (Signed)

## 2020-08-24 NOTE — Progress Notes (Signed)
PT Cancellation Note  Patient Details Name: Harman Ferrin MRN: [id number] DOB: [birth year]   Cancelled Treatment:    Reason Eval/Treat Not Completed: Other (comment) (Pt just walked with nursing. Did well per nurse.)   Denice Paradise 08/24/2020, 2:22 PM Judyann Casasola W,PT [place] Pager:  [phone]  Office:  [phone]

## 2020-08-24 NOTE — TOC Initial Note (Addendum)
Transition of Care [facility]) - Initial/Assessment Note    Patient Details  Name: Angel Brewer MRN: [id number] Date of Birth: 1965-12-14  [facility]) CM/SW Contact:    Joanne Chars, LCSW Phone Number: 08/24/2020, 10:41 AM  Clinical Narrative: CSW met with pt to discuss discharge plan.  Pt somewhat hesitant about HH but does agree, states "once I get walking I'll be fine."  Choice document provided. Permission given to speak with son tyrell and [facility] agencies.  Pt has not had prior home O2 and still asking questions regarding if she will need it and for how much of the day.  Current equipment: walker, 3n1.  Pt is not vaccinated for covid.                  1300: Cory from [place] declines pt due to [employer]. 1545-[facility] could possibly take this referral but it would be next Wed/Thurs at earliest.     Expected Discharge Plan: [place] Barriers to Discharge: Continued Medical Work up [facility] with Medicaid)   Patient Goals and CMS Choice Patient states their goals for this hospitalization and ongoing recovery are:: "get back to walking" CMS Medicare.gov Compare Post Acute Care list provided to:: Patient Choice offered to / list presented to : Patient  Expected Discharge Plan and Services Expected Discharge Plan: [place] Choice: [place] arrangements for the past 2 months: Single Family Home                                      Prior Living Arrangements/Services Living arrangements for the past 2 months: Single Family Home Lives with:: Adult Children Patient language and need for interpreter reviewed:: Yes Do you feel safe going back to the place where you live?: Yes      Need for Family Participation in Patient Care: Yes (Comment) Care giver support system in place?: Yes (comment) Current home services: Other (comment) (none) Criminal Activity/Legal Involvement  Pertinent to Current Situation/Hospitalization: No - Comment as needed  Activities of Daily Living      Permission Sought/Granted Permission sought to share information with : Family Supports Permission granted to share information with : Yes, Verbal Permission Granted  Share Information with NAME: son Tyrell  Permission granted to share info w AGENCY: HH        Emotional Assessment Appearance:: Appears stated age Attitude/Demeanor/Rapport: Engaged Affect (typically observed): Pleasant Orientation: : Oriented to Self,Oriented to Place,Oriented to  Time,Oriented to Situation Alcohol / Substance Use: Not Applicable Psych Involvement: No (comment)  Admission diagnosis:  Respiratory failure (HCC) [J96.90] Chronic respiratory failure with hypoxia (HCC) [J96.11] Acute on chronic respiratory failure with hypoxia ([place]) [J96.21] Patient Active Problem List   Diagnosis Date Noted  . Respiratory failure ([place]) 08/19/2020  . Pulmonary hypertension ([place]) 08/18/2020  . OSA (obstructive sleep apnea) 08/18/2020  . Chronic deep vein thrombosis (DVT) of femoral vein of left lower extremity ([place]) 02/09/2020  . Chronic anticoagulation 02/09/2020  . Elevated blood pressure reading 02/09/2020  . Right wrist fracture 06/14/2019  . Chronic respiratory failure with hypoxia ([place]) 06/13/2019  . Essential hypertension 08/25/2017  . OSA on CPAP 02/03/2017  . Morbid obesity ([place]) 10/29/2016  . Vitamin D deficiency 10/29/2016   PCP:  [name], NP Pharmacy:   [id number] [place], [place]  [place] [place] [address] Phone: [phone] Fax: [phone]  [facility] [place], [place] - [address] [place] [address] Phone: [phone] Fax: [phone]     Social Determinants of Health (SDOH) Interventions    Readmission Risk Interventions Readmission Risk Prevention Plan 12/02/2019  Post  Dischage Appt Complete  Medication Screening Complete  Transportation Screening Complete  Some recent data might be hidden

## 2020-08-24 NOTE — Progress Notes (Signed)
SATURATION QUALIFICATIONS: (This note is used to comply with regulatory documentation for home oxygen)  Patient Saturations on Room Air at Rest = 97%  Patient Saturations on Room Air while Ambulating = 87  Patient Saturations on 2 Liters of oxygen while Ambulating = 95%  Please briefly explain why patient needs home oxygen:pt ambulated 150 feet and was unable to maintain O2 above 90%

## 2020-08-24 NOTE — Plan of Care (Signed)

## 2020-08-25 ENCOUNTER — Inpatient Hospital Stay (HOSPITAL_COMMUNITY): Payer: Medicaid Other

## 2020-08-25 DIAGNOSIS — Z9989 Dependence on other enabling machines and devices: Secondary | ICD-10-CM | POA: Diagnosis not present

## 2020-08-25 DIAGNOSIS — K5900 Constipation, unspecified: Secondary | ICD-10-CM | POA: Diagnosis not present

## 2020-08-25 DIAGNOSIS — J9621 Acute and chronic respiratory failure with hypoxia: Secondary | ICD-10-CM | POA: Diagnosis not present

## 2020-08-25 DIAGNOSIS — J9611 Chronic respiratory failure with hypoxia: Secondary | ICD-10-CM | POA: Diagnosis not present

## 2020-08-25 DIAGNOSIS — I10 Essential (primary) hypertension: Secondary | ICD-10-CM | POA: Diagnosis not present

## 2020-08-25 DIAGNOSIS — I272 Pulmonary hypertension, unspecified: Secondary | ICD-10-CM | POA: Diagnosis not present

## 2020-08-25 DIAGNOSIS — R0602 Shortness of breath: Secondary | ICD-10-CM | POA: Diagnosis not present

## 2020-08-25 DIAGNOSIS — G4733 Obstructive sleep apnea (adult) (pediatric): Secondary | ICD-10-CM | POA: Diagnosis not present

## 2020-08-25 DIAGNOSIS — I517 Cardiomegaly: Secondary | ICD-10-CM | POA: Diagnosis not present

## 2020-08-25 DIAGNOSIS — J9622 Acute and chronic respiratory failure with hypercapnia: Secondary | ICD-10-CM | POA: Diagnosis not present

## 2020-08-25 DIAGNOSIS — D649 Anemia, unspecified: Secondary | ICD-10-CM | POA: Diagnosis not present

## 2020-08-25 LAB — CBC WITH DIFFERENTIAL/PLATELET
Abs Immature Granulocytes: 0.02 10*3/uL (ref 0.00–0.07)
Basophils Absolute: 0.1 10*3/uL (ref 0.0–0.1)
Basophils Relative: 1 %
Eosinophils Absolute: 0.1 10*3/uL (ref 0.0–0.5)
Eosinophils Relative: 3 %
HCT: 36 % (ref 36.0–46.0)
Hemoglobin: 11.6 g/dL — ABNORMAL LOW (ref 12.0–15.0)
Immature Granulocytes: 1 %
Lymphocytes Relative: 27 %
Lymphs Abs: 1.1 10*3/uL (ref 0.7–4.0)
MCH: 29.6 pg (ref 26.0–34.0)
MCHC: 32.2 g/dL (ref 30.0–36.0)
MCV: 91.8 fL (ref 80.0–100.0)
Monocytes Absolute: 0.5 10*3/uL (ref 0.1–1.0)
Monocytes Relative: 13 %
Neutro Abs: 2.2 10*3/uL (ref 1.7–7.7)
Neutrophils Relative %: 55 %
Platelets: 168 10*3/uL (ref 150–400)
RBC: 3.92 MIL/uL (ref 3.87–5.11)
RDW: 13.2 % (ref 11.5–15.5)
WBC: 4.1 10*3/uL (ref 4.0–10.5)
nRBC: 0 % (ref 0.0–0.2)

## 2020-08-25 LAB — MAGNESIUM: Magnesium: 1.9 mg/dL (ref 1.7–2.4)

## 2020-08-25 LAB — COMPREHENSIVE METABOLIC PANEL
ALT: 10 U/L (ref 0–44)
AST: 13 U/L — ABNORMAL LOW (ref 15–41)
Albumin: 3.1 g/dL — ABNORMAL LOW (ref 3.5–5.0)
Alkaline Phosphatase: 42 U/L (ref 38–126)
Anion gap: 9 (ref 5–15)
BUN: 21 mg/dL — ABNORMAL HIGH (ref 6–20)
CO2: 32 mmol/L (ref 22–32)
Calcium: 8.9 mg/dL (ref 8.9–10.3)
Chloride: 95 mmol/L — ABNORMAL LOW (ref 98–111)
Creatinine, Ser: 0.91 mg/dL (ref 0.44–1.00)
GFR, Estimated: >60 mL/min (ref >60)
Glucose, Bld: 91 mg/dL (ref 70–99)
Potassium: 3.7 mmol/L (ref 3.5–5.1)
Sodium: 136 mmol/L (ref 135–145)
Total Bilirubin: 1 mg/dL (ref 0.3–1.2)
Total Protein: 7 g/dL (ref 6.5–8.1)

## 2020-08-25 LAB — GLUCOSE, CAPILLARY
Glucose-Capillary: 100 mg/dL — ABNORMAL HIGH (ref 70–99)
Glucose-Capillary: 101 mg/dL — ABNORMAL HIGH (ref 70–99)
Glucose-Capillary: 101 mg/dL — ABNORMAL HIGH (ref 70–99)
Glucose-Capillary: 110 mg/dL — ABNORMAL HIGH (ref 70–99)

## 2020-08-25 LAB — BRAIN NATRIURETIC PEPTIDE: B Natriuretic Peptide: 13.2 pg/mL (ref 0.0–100.0)

## 2020-08-25 LAB — PHOSPHORUS: Phosphorus: 4.1 mg/dL (ref 2.5–4.6)

## 2020-08-25 MED ORDER — POLYETHYLENE GLYCOL 3350 17 G PO PACK: 17.0000 g | PACK | Freq: Every day | ORAL | 0 refills | Status: DC

## 2020-08-25 MED ORDER — FUROSEMIDE 10 MG/ML IJ SOLN
40.0000 mg | Freq: Once | INTRAMUSCULAR | Status: AC
  Administered 2020-08-25: 40 mg via INTRAVENOUS
  Filled 2020-08-25: qty 4

## 2020-08-25 MED ORDER — FUROSEMIDE 40 MG PO TABS: 40.0000 mg | ORAL_TABLET | Freq: Every day | ORAL | 0 refills | Status: DC

## 2020-08-25 MED ORDER — ALBUTEROL SULFATE HFA 108 (90 BASE) MCG/ACT IN AERS: 2.0000 | INHALATION_SPRAY | RESPIRATORY_TRACT | 0 refills | Status: DC | PRN

## 2020-08-25 MED ORDER — FUROSEMIDE 40 MG PO TABS: 40.0000 mg | ORAL_TABLET | Freq: Every day | ORAL | Status: DC

## 2020-08-25 MED ORDER — GUAIFENESIN ER 600 MG PO TB12: 1200.0000 mg | ORAL_TABLET | Freq: Two times a day (BID) | ORAL | 0 refills | Status: AC

## 2020-08-25 MED ORDER — SENNOSIDES-DOCUSATE SODIUM 8.6-50 MG PO TABS: 1.0000 | ORAL_TABLET | Freq: Every evening | ORAL | 0 refills | Status: DC | PRN

## 2020-08-25 NOTE — TOC Transition Note (Signed)
Transition of Care [facility]) - CM/SW Discharge Note   Patient Details  Name: Kashari Chalmers MRN: [id number] Date of Birth: 12-07-1965  Transition of Care [facility]) CM/SW Contact:  Carles Collet, RN Phone Number: 08/25/2020, 1:43 PM   Clinical Narrative:    No home O2 needs (90% RA) Notified Arnoldo Hooker that patient will DC today.  No other DC needs identified.     Final next level of care: [place] Barriers to Discharge: No Barriers Identified   Patient Goals and CMS Choice Patient states their goals for this hospitalization and ongoing recovery are:: "get back to walking" CMS Medicare.gov Compare Post Acute Care list provided to:: Patient Choice offered to / list presented to : Patient  Discharge Placement                       Discharge Plan and Services     Post Acute Care Choice: Home Health                    HH Arranged: PT [place]: [facility] Date [place]: 08/25/20 Time [place]: 3212 Representative spoke with at [address]: [place] ([place]) Interventions     Readmission Risk Interventions Readmission Risk Prevention Plan 12/02/2019  Post Dischage Appt Complete  Medication Screening Complete  Transportation Screening Complete  Some recent data might be hidden

## 2020-08-25 NOTE — Discharge Summary (Signed)
Physician Discharge Summary  Angel Brewer CXK:[id number] DOB: 03-Sep-1965 DOA: 08/18/2020  PCP: [name], NP  Admit date: 08/18/2020 Discharge date: 08/25/2020  Admitted From: Home Disposition: Home with Home Health   Recommendations for Outpatient Follow-up:  1. Follow up with PCP in 1-2 weeks 2. Follow up with Cardiology Dr. Einar Gip within 1 week 3. Follow up with Pulmonary and repeat Sleep Study and have Mask Titration as an outpatient  4. Repeat CXR in 3 to 6 weeks  5. Please obtain CMP/CBC, Mag, Phos in one week 6. Please follow up on the following pending results:  Home Health: Yes  Equipment/Devices: Bariatric 3in1, Tub Seat/Shower Bench    Discharge Condition: Stable  CODE STATUS: FULL CODE Diet recommendation: Heart Healthy Diet   Brief/Interim Summary: Patient is a super morbidly obese African-American 55 year old female with a past medical history significant for provoked DVT, history of class III obesity, OSA with intermittent CPAP compliance, as well as other comorbidities who presented with acute on chronic shortness of breath.  In the ED work-up showed hypoxemia, CTA chest was benign.  She developed progressive lethargy in the ER so code stroke was called.  ABG showed severe acute on chronic hypercarbia and PCCM was consulted.  Daughter was on the provided most of the history at bedside as patient was somnolent.  Patient's CPAP mask has not been fitting her well and she has not been wearing it and trying to get into a DME for mask refitting but having issues with this.  She was placed on BiPAP in the ED with improvement her mentation but required a good deal pressures to get appropriate volumes.  She is stabilized by critical care and was weaned off of her BiPAP to supplemental oxygen via nasal cannula.  She continues with supplemental oxygen currently and requires BiPAP at night.  Her encephalopathy is improved and she was transferred to [facility] service on 08/22/2020.   Respiratory status is improved and she is able to be weaned off of supplemental oxygen via nasal cannula.  She has been given IV diuresis last 2 days and will get it again today prior to discharge and will transition her to p.o. Lasix.  She has a follow-up appoint with Dr. Einar Gip in the outpatient setting.  She also needs to see pulmonology in the outpatient setting for her OSA/OHS and CPAP titration.  She is stable to be discharged home at this time with home health services and follow-up with her PCP, cardiology, as well as pulmonology in outpatient setting.  Discharge Diagnoses:  Principal Problem:   Chronic respiratory failure with hypoxia (HCC) Active Problems:   Morbid obesity ([place])   Essential hypertension   OSA on CPAP   Pulmonary hypertension (HCC)   Respiratory failure (HCC)  Acute on Chronic Hypercarbic Respiratory Failure, improved Acute Respiratory Failure with Hypoxia, improved and now off oxygen -presumably due to BiPAP non-adherence but most likely in the setting of OHS/OSA overlap syndrome, groups 2-3 pulmonary hypertension and poor compliance with BiPAP.Possible volume overload, habitus limits evaluation.  -ABG Labs on Last check  Labs (Brief)          Component Value Date/Time   PHART 7.293 (L) 08/20/2020 0912   PCO2ART 86.7 (HH) 08/20/2020 0912   PO2ART 73 (L) 08/20/2020 0912   HCO3 42.2 (H) 08/20/2020 0912   TCO2 45 (H) 08/20/2020 0912   ACIDBASEDEF 3.0 (H) 11/22/2019 1119   O2SAT 92.0 08/20/2020 0912     -BNP WNL.Volume felt less likely culprit on Admisison  and likely worsened with new supplemental oxygen reducing her respiratory drive but could definitely could be contributing  -Diuresis had stopped given that she is net negative but resumed again and she is -3.0278 Liters  -Continue BIPAP EVERY NIGHT, referral to sleep medicine -Does not wear Supplemental O2 at home but did Desaturate on Ambulation today and was able to not wear O2 on Room Air  -He  continues to be improved with better ventilation and she is awake and alert and pulmonary has referred see if DME mask refitting is needed she will need to be seen by Dr. Chesley Mires, Kara Mead or Dr. Ander Slade; she really needs another OPO and/or a CPAP titration study. -C/w Flutter Valve, Incentive Spirometry, and Guaifenesin -Will check CXR today and showed "Cardiomegaly. No acute cardiopulmonary abnormality.." -She is received IV Lasix 3 days neuro including today and she is stable for discharge -Echocardiogram showed EF of 60 to 65% with indeterminate diastolic function but suspect has some Heart Failure component  -Continue with Albuterol 2 puffs IH every 4 as needed for wheezing shortness of breath -Will need PT/OT to evaluate and Treat and Ambulatory Home O2 Screen and she needs Home Health PT/OT and Intermittent supervision with mobility and assist for ADL ; amatory screen today did show that she did not desaturate and her saturations remained above 90% -Will continue Flutter Valve, Incentive Spirometry, -Started on p.o. Lasix 40 mg daily and follow-up with cardiology next-repeat chest x-ray in 3 to 6 weeks and follow-up with pulmonary within 1 week and have CPAP titration study done.  Acute Hypercarbic Encephalopathy -Due to Hypercarbia, improved on BiPAP. Stable and improved off BiPAP  Leukopenia -Mild with a WBC of 3.5 and improved to 5.0  the day before yesterday and is 4.4 yesterday and today it is 4.1  -continue monitor and trend repeat CBC within 1 week  History of VTE -Resume Apixaban 2/8  OSA -C/w BiPAP qHS and PRN -Resume home CPAP and with pulmonary follow-up and titration  Normocytic Anemia -Patient's Lat Hgb/Hct was 11.6/34.0 -> 12.7/39.6 -> 12.3/40.0 -> 11.6/37.5 and is stable at 11.6/36.0 today -Checked Anemia Panel and showed an Iron level of 40, U IBC of 279, TIBC of 319, saturation ratios of 13%, ferritin level of 142, folate level of 13.4, vitamin B12  of 107 -Will start Vitamin B12 Supplementation and give a 1x dose of IM Cyanocobalamin 1000 mcg today and start po in the AM -Continue to Monitor for S/Sx of Bleeding  -Repeat CBC within 1 week  Super Morbid Obesity -Complicates overall prognosis and care -Estimated body mass index is 62.78 kg/m as calculated from the following:   Height as of this encounter: _0  (1.727 m).   Weight as of this encounter: 187.3 kg. -Weight Loss and Dietary Counseling given   Hyperbilirubinemia -Patient's T Bili slightly worsened and went from 1.0 -> 0.9 -> 1.4 and is now improved to 1.0 -Continue to Monitor and Trend in outpatient setting -Repeat CMP within 1 week  Constipation -We will add bowel regimen and start docusate senna docusate and continue MiraLAX 17 g daily.  If necessary will provide a suppository versus enema  -bowel regimen has improved and she had bowel movement.  Continue bowel regimen at discharge  Elevated BUN -Mild with a BUN of 21 again  -Continue to Monitor for S/Sx of upper GI Bleeding -Currently not on Steroids -Continue to Monitor and Trend in the outpatient setting -Repeat CMP within 1 week   Discharge Instructions  Discharge Instructions    Call MD for:  difficulty breathing, headache or visual disturbances   Complete by: As directed    Call MD for:  extreme fatigue   Complete by: As directed    Call MD for:  hives   Complete by: As directed    Call MD for:  persistant dizziness or light-headedness   Complete by: As directed    Call MD for:  persistant nausea and vomiting   Complete by: As directed    Call MD for:  redness, tenderness, or signs of infection (pain, swelling, redness, odor or green/yellow discharge around incision site)   Complete by: As directed    Call MD for:  severe uncontrolled pain   Complete by: As directed    Call MD for:  temperature >100.4   Complete by: As directed    Diet - low sodium heart healthy   Complete by: As directed     Discharge instructions   Complete by: As directed    You were cared for by a hospitalist during your hospital stay. If you have any questions about your discharge medications or the care you received while you were in the hospital after you are discharged, you can call the unit and ask to speak with the hospitalist on call if the hospitalist that took care of you is not available. Once you are discharged, your primary care physician will handle any further medical issues. Please note that NO REFILLS for any discharge medications will be authorized once you are discharged, as it is imperative that you return to your primary care physician (or establish a relationship with a primary care physician if you do not have one) for your aftercare needs so that they can reassess your need for medications and monitor your lab values.  Follow up with PCP, Pulmonary, and Cardiology. Take all medications as prescribed. If symptoms change or worsen please return to the ED for evaluation   Increase activity slowly   Complete by: As directed      Allergies as of 08/25/2020   No Known Allergies     Medication List    STOP taking these medications   ibuprofen 200 MG tablet Commonly known as: ADVIL     TAKE these medications   acetaminophen 325 MG tablet Commonly known as: Tylenol Take 2 tablets (650 mg total) by mouth every 6 (six) hours. What changed:   when to take this  reasons to take this   albuterol 108 (90 Base) MCG/ACT inhaler Commonly known as: VENTOLIN HFA Inhale 2 puffs into the lungs every 4 (four) hours as needed for wheezing or shortness of breath.   apixaban 5 MG Tabs tablet Commonly known as: ELIQUIS Take 1 tablet (5 mg total) by mouth 2 (two) times daily.   Blood Pressure Monitor Devi Take blood pressure daily   furosemide 40 MG tablet Commonly known as: LASIX Take 1 tablet (40 mg total) by mouth daily. Start taking on: August 26, 2020   guaiFENesin 600 MG 12 hr  tablet Commonly known as: MUCINEX Take 2 tablets (1,200 mg total) by mouth 2 (two) times daily for 5 days.   polyethylene glycol 17 g packet Commonly known as: MIRALAX / GLYCOLAX Take 17 g by mouth daily. Start taking on: August 26, 2020   PRESCRIPTION MEDICATION CPAP- At bedtime   senna-docusate 8.6-50 MG tablet Commonly known as: Senokot-S Take 1 tablet by mouth at bedtime as needed for mild constipation.  Durable Medical Equipment  (From admission, onward)         Start     Ordered   08/25/20 1311  DME 3-in-1  Once       Comments: Bariatric   08/25/20 1312   08/25/20 1311  DME Shower stool  Once       Comments: Bariatric   08/25/20 1312   08/25/20 1311  DME Tub Bench  Once       Comments: Bariatric   08/25/20 1312          Follow-up Information    Schedule an appointment as soon as possible for a visit  with [name], NP.   Specialty: Nurse Practitioner Contact information: [address] [id number] [phone]        [facility], [facility] Follow up.   Specialty: Home Health Services Why: For home health services. They will start Wednesday or Thursday of next week Contact information: [address] [place] [address] [phone]              No Known Allergies  Consultations:  PCCM Transfer  Procedures/Studies: DG Chest 2 View  Result Date: 08/18/2020 CLINICAL DATA:  Shortness of breath EXAM: CHEST - 2 VIEW COMPARISON:  11/23/2019 FINDINGS: Cardiomegaly, vascular congestion. No overt edema. Lingular atelectasis or scarring. No effusions or acute bony abnormality. IMPRESSION: Cardiomegaly, vascular congestion. Electronically Signed   By: Rolm Baptise M.D.   On: 08/18/2020 03:12   CT Head Wo Contrast  Result Date: 08/19/2020 CLINICAL DATA:  Headache, intracranial hemorrhage suspected. Confusion. EXAM: CT HEAD WITHOUT CONTRAST TECHNIQUE: Contiguous axial images were obtained  from the base of the skull through the vertex without intravenous contrast. COMPARISON:  None. FINDINGS: Brain: No acute infarct, hemorrhage, or mass lesion is present. The ventricles are of normal size. No significant extraaxial fluid collection is present. The brainstem and cerebellum are within normal limits. Vascular: No hyperdense vessel or unexpected calcification. Skull: No significant extracranial soft tissue lesion is present. Calvarium is intact. No focal lytic or blastic lesions are present. Sinuses/Orbits: The paranasal sinuses and mastoid air cells are clear. The globes and orbits are within normal limits. IMPRESSION: Negative CT of the head. Electronically Signed   By: San Morelle M.D.   On: 08/19/2020 11:42   CT Angio Chest PE W and/or Wo Contrast  Result Date: 08/18/2020 CLINICAL DATA:  Shortness of breath. Difficulty sleeping. COPD. Pulmonary emboli suspected. EXAM: CT ANGIOGRAPHY CHEST WITH CONTRAST TECHNIQUE: Multidetector CT imaging of the chest was performed using the standard protocol during bolus administration of intravenous contrast. Multiplanar CT image reconstructions and MIPs were obtained to evaluate the vascular anatomy. CONTRAST:  24m OMNIPAQUE IOHEXOL 350 MG/ML SOLN COMPARISON:  Chest radiography same day. Previous CT angiography 06/13/2019. previous CT 02/29/2016 FINDINGS: Cardiovascular: The heart is enlarged. No pericardial effusion. No visible coronary artery calcification. Some aortic atherosclerotic calcification is present. Pulmonary arteries are prominent consistent with pulmonary arterial hypertension. Pulmonary arterial opacification is moderate. No pulmonary emboli are seen. Small distal emboli might be inapparent. No large or central emboli. Mediastinum/Nodes: Chronic prominent nodes in the right hilum, subcarinal region and right paratracheal region. These have not changed or worsened since 2017. Lungs/Pleura: No pleural effusion. Mosaic attenuation pattern  of the lungs as seen chronically. Seen in association with adenopathy, sarcoid could possibly give this appearance. Upper Abdomen: Negative Musculoskeletal: Ordinary thoracic degenerative changes. Review of the MIP images confirms the above findings. IMPRESSION:  1. No large or central pulmonary emboli. Pulmonary arterial opacification is only moderate. Small distal emboli might be inapparent. 2. Cardiomegaly. Enlarged pulmonary arteries consistent with pulmonary arterial hypertension. 3. Chronic enlargement of right hilar, subcarinal and paratracheal lymph nodes. 4. Chronic mosaic attenuation pattern of the lungs as seen chronically. Seen in association with adenopathy, sarcoid could possibly give this appearance. 5. Aortic atherosclerosis. Aortic Atherosclerosis (ICD10-I70.0). Electronically Signed   By: Nelson Chimes M.D.   On: 08/18/2020 20:39   DG CHEST PORT 1 VIEW  Result Date: 08/25/2020 CLINICAL DATA:  Shortness of breath EXAM: PORTABLE CHEST 1 VIEW COMPARISON:  August 24, 2020 FINDINGS: No edema or airspace opacity. There is cardiomegaly with pulmonary vascularity within normal limits. No adenopathy. No bone lesions. IMPRESSION: Generalized cardiomegaly.  Lungs clear. Electronically Signed   By: Lowella Grip III M.D.   On: 08/25/2020 09:01   DG CHEST PORT 1 VIEW  Result Date: 08/24/2020 CLINICAL DATA:  55 year old female with shortness of breath. EXAM: PORTABLE CHEST 1 VIEW COMPARISON:  Portable chest 08/23/2020 and earlier. FINDINGS: Portable AP semi upright view at 0505 hours. Stable cardiomegaly and mediastinal contours. Visualized tracheal air column is within normal limits. Allowing for portable technique the lungs are clear. No pneumothorax. Negative visible bowel gas and osseous structures. IMPRESSION: Cardiomegaly.  No acute cardiopulmonary abnormality. Electronically Signed   By: Genevie Ann M.D.   On: 08/24/2020 06:41   DG CHEST PORT 1 VIEW  Result Date: 08/23/2020 CLINICAL DATA:   55 year old female with shortness of breath. Cardiomegaly. EXAM: PORTABLE CHEST 1 VIEW COMPARISON:  Portable chest 08/21/2020 and earlier. FINDINGS: Portable AP semi upright view at 0344 hours. Mildly improved lung volumes. Stable cardiomegaly and mediastinal contours. Decreased bilateral pulmonary vascularity/interstitial opacity. No pneumothorax, pleural effusion or consolidation. Negative visible bowel gas and osseous structures. IMPRESSION: 1. Improved ventilation and largely resolved interstitial edema. 2. Stable cardiomegaly.  No new cardiopulmonary abnormality. Electronically Signed   By: Genevie Ann M.D.   On: 08/23/2020 06:07   DG CHEST PORT 1 VIEW  Result Date: 08/21/2020 CLINICAL DATA:  55 year old female with shortness of breath. Cardiomegaly. EXAM: PORTABLE CHEST 1 VIEW COMPARISON:  CTA chest 08/18/2020 and earlier. FINDINGS: Portable AP semi upright view at 0331 hours. Mildly rotated to the right today. Stable cardiomegaly and mediastinal contours. Stable lung volumes. Stable pulmonary vascularity, increased compared to 2017 radiographs. No pneumothorax, pleural effusion or consolidation. Negative visible bowel gas, osseous structures. Visualized tracheal air column is within normal limits. IMPRESSION: Stable cardiomegaly and pulmonary interstitial edema. Electronically Signed   By: Genevie Ann M.D.   On: 08/21/2020 07:42   ECHOCARDIOGRAM COMPLETE  Result Date: 08/19/2020    ECHOCARDIOGRAM REPORT   Patient Name:   Lovinia Villa Date of Exam: 08/19/2020 Medical Rec #:  [id number]      Height:       68.0 in Accession #:    [id number]     Weight:       403.0 lb Date of Birth:  1966/02/26      BSA:          2.753 m Patient Age:    17 years       BP:           179/101 mmHg Patient Gender: F              HR:           101 bpm. Exam Location:  Inpatient Procedure: 2D Echo, Cardiac Doppler and Color Doppler  Indications:    I27.20 Pulmonary Hypertension  History:        Patient has prior history of Echocardiogram  examinations, most                 recent 11/23/2019. Risk Factors:Morbidly Obese. Stroke like                 symptoms during echo. Patient taken emergently to CT.  Sonographer:    Merrie Roof RDCS Referring Phys: [id number] [facility]  Sonographer Comments: Technically difficult study due to poor echo windows and patient is morbidly obese. Image acquisition challenging due to patient body habitus. IMPRESSIONS  1. Left ventricular ejection fraction, by estimation, is 60 to 65%. The left ventricle has normal function. The left ventricle has no regional wall motion abnormalities. There is mild left ventricular hypertrophy. Left ventricular diastolic function could not be evaluated.  2. Right ventricular systolic function was not well visualized. The right ventricular size is not well visualized.  3. The mitral valve was not assessed. No evidence of mitral valve regurgitation.  4. The aortic valve was not well visualized. Aortic valve regurgitation is not visualized. Comparison(s): Incomplete echo - patient emergently during the echo to CT for possible stroke. Echo windows are poor- consider limited follow-up echo with Definity contrast if necessary. FINDINGS  Left Ventricle: Left ventricular ejection fraction, by estimation, is 60 to 65%. The left ventricle has normal function. The left ventricle has no regional wall motion abnormalities. The left ventricular internal cavity size was normal in size. There is  mild left ventricular hypertrophy. Left ventricular diastolic function could not be evaluated due to indeterminate diastolic function. Left ventricular diastolic function could not be evaluated. Right Ventricle: The right ventricular size is not well visualized. Right vetricular wall thickness was not assessed. Right ventricular systolic function was not well visualized. Left Atrium: Left atrial size was normal in size. Right Atrium: Right atrial size was normal in size. Pericardium: There is no evidence of  pericardial effusion. Mitral Valve: The mitral valve was not assessed. No evidence of mitral valve regurgitation. Tricuspid Valve: The tricuspid valve is not assessed. Tricuspid valve regurgitation is not demonstrated. Aortic Valve: The aortic valve was not well visualized. Aortic valve regurgitation is not visualized. Pulmonic Valve: The pulmonic valve was grossly normal. Pulmonic valve regurgitation is trivial. Aorta: The aortic root was not well visualized. IAS/Shunts: No atrial level shunt detected by color flow Doppler.  LEFT VENTRICLE PLAX 2D LVIDd:         4.30 cm LVIDs:         3.20 cm LV PW:         1.20 cm LV IVS:        1.30 cm LVOT diam:     2.60 cm LVOT Area:     5.31 cm  LEFT ATRIUM           Index LA diam:      3.50 cm 1.27 cm/m LA Vol (A4C): 67.6 ml 24.56 ml/m   AORTA Ao Root diam: 3.40 cm  SHUNTS Systemic Diam: 2.60 cm Lyman Bishop MD Electronically signed by Lyman Bishop MD Signature Date/Time: 08/19/2020/12:59:19 PM    Final    ECHOCARDIOGRAM IMPRESSIONS    1. Left ventricular ejection fraction, by estimation, is 60 to 65%. The  left ventricle has normal function. The left ventricle has no regional  wall motion abnormalities. There is mild left ventricular hypertrophy.  Left ventricular diastolic function  could not be evaluated.  2. Right ventricular systolic function was not well visualized. The right  ventricular size is not well visualized.  3. The mitral valve was not assessed. No evidence of mitral valve  regurgitation.  4. The aortic valve was not well visualized. Aortic valve regurgitation  is not visualized.   Comparison(s): Incomplete echo - patient emergently during the echo to CT  for possible stroke. Echo windows are poor- consider limited follow-up  echo with Definity contrast if necessary.   FINDINGS  Left Ventricle: Left ventricular ejection fraction, by estimation, is 60  to 65%. The left ventricle has normal function. The left ventricle has no   regional wall motion abnormalities. The left ventricular internal cavity  size was normal in size. There is  mild left ventricular hypertrophy. Left ventricular diastolic function  could not be evaluated due to indeterminate diastolic function. Left  ventricular diastolic function could not be evaluated.   Right Ventricle: The right ventricular size is not well visualized. Right  vetricular wall thickness was not assessed. Right ventricular systolic  function was not well visualized.   Left Atrium: Left atrial size was normal in size.   Right Atrium: Right atrial size was normal in size.   Pericardium: There is no evidence of pericardial effusion.   Mitral Valve: The mitral valve was not assessed. No evidence of mitral  valve regurgitation.   Tricuspid Valve: The tricuspid valve is not assessed. Tricuspid valve  regurgitation is not demonstrated.   Aortic Valve: The aortic valve was not well visualized. Aortic valve  regurgitation is not visualized.   Pulmonic Valve: The pulmonic valve was grossly normal. Pulmonic valve  regurgitation is trivial.   Aorta: The aortic root was not well visualized.   IAS/Shunts: No atrial level shunt detected by color flow Doppler.     LEFT VENTRICLE  PLAX 2D  LVIDd:     4.30 cm  LVIDs:     3.20 cm  LV PW:     1.20 cm  LV IVS:    1.30 cm  LVOT diam:   2.60 cm  LVOT Area:   5.31 cm     LEFT ATRIUM      Index  LA diam:   3.50 cm 1.27 cm/m  LA Vol (A4C): 67.6 ml 24.56 ml/m    AORTA  Ao Root diam: 3.40 cm     SHUNTS  Systemic Diam: 2.60 cm   Subjective: Seen and examined at bedside and she was doing fairly well.  Ambulated without issues and did not desaturate.  Off of supplemental oxygen via nasal cannula felt better.  No other concerns or complaints this time and is ready to go home.  Discharge Exam: Vitals:   08/25/20 0729 08/25/20 1150  BP: 111/67 118/79  Pulse: (!) 50 68  Resp: 14 (!)  22  Temp: (!) 97.5 F (36.4 C) 98.3 F (36.8 C)  SpO2: 96% 97%   Vitals:   08/25/20 0009 08/25/20 0420 08/25/20 0729 08/25/20 1150  BP: 120/67 128/77 111/67 118/79  Pulse: (!) 52  (!) 50 68  Resp: _0 (!) 22  Temp: 98.5 F (36.9 C) 98.1 F (36.7 C) (!) 97.5 F (36.4 C) 98.3 F (36.8 C)  TempSrc: Oral Oral Oral Oral  SpO2: 98% 97% 96% 97%  Weight:      Height:       General: Pt is alert, awake, not in acute distress Cardiovascular: RRR, S1/S2 +, no rubs, no gallops Respiratory: Diminished bilaterally, no wheezing, no rhonchi;  unlabored breathing and not wearing any supplemental oxygen via nasal cannula Abdominal: Soft, NT, distended secondary to body habitus, bowel sounds + Extremities: Mild extremity edema edema, no cyanosis  The results of significant diagnostics from this hospitalization (including imaging, microbiology, ancillary and laboratory) are listed below for reference.    Microbiology: Recent Results (from the past 240 hour(s))  SARS Coronavirus 2 by RT PCR (hospital order, performed in [facility] hospital lab) Nasopharyngeal Nasopharyngeal Swab     Status: None   Collection Time: 08/18/20  6:21 PM   Specimen: Nasopharyngeal Swab  Result Value Ref Range Status   SARS Coronavirus 2 NEGATIVE NEGATIVE Final    Comment: (NOTE) SARS-CoV-2 target nucleic acids are NOT DETECTED.  The SARS-CoV-2 RNA is generally detectable in upper and lower respiratory specimens during the acute phase of infection. The lowest concentration of SARS-CoV-2 viral copies this assay can detect is 250 copies / mL. A negative result does not preclude SARS-CoV-2 infection and should not be used as the sole basis for treatment or other patient management decisions.  A negative result may occur with improper specimen collection / handling, submission of specimen other than nasopharyngeal swab, presence of viral mutation(s) within the areas targeted by this assay, and inadequate number of  viral copies (<250 copies / mL). A negative result must be combined with clinical observations, patient history, and epidemiological information.  Fact Sheet for Patients:   StrictlyIdeas.no  Fact Sheet for Healthcare Providers: BankingDealers.co.za  This test is not yet approved or  cleared by the [place] FDA and has been authorized for detection and/or diagnosis of SARS-CoV-2 by FDA under an Emergency Use Authorization (EUA).  This EUA will remain in effect (meaning this test can be used) for the duration of the COVID-19 declaration under Section 564(b)(1) of the Act, 21 U.S.C. section 360bbb-3(b)(1), unless the authorization is terminated or revoked sooner.  Performed at [facility], [place] [address], [place], [place] [address]   MRSA PCR Screening     Status: None   Collection Time: 08/19/20 10:33 PM   Specimen: Nasal Mucosa; Nasopharyngeal  Result Value Ref Range Status   MRSA by PCR NEGATIVE NEGATIVE Final    Comment:        The GeneXpert MRSA Assay (FDA approved for NASAL specimens only), is one component of a comprehensive MRSA colonization surveillance program. It is not intended to diagnose MRSA infection nor to guide or monitor treatment for MRSA infections. Performed at [facility], [place] [address], [place], [place] [address]      Labs: BNP (last 3 results) Recent Labs    11/22/19 1122 08/18/20 1832 08/25/20 0433  BNP 857.3* 30.6 58.8   Basic Metabolic Panel: Recent Labs  Lab 08/20/20 0439 08/20/20 0912 08/22/20 0931 08/23/20 0036 08/24/20 0257 08/25/20 0433  NA 140 140 140 138 138 136  K 4.4 4.3 4.3 4.2 4.1 3.7  CL 98  --  94* 96* 97* 95*  CO2 31  --  37* 31 33* 32  GLUCOSE 207*  --  83 100* 85 91  BUN 14  --  18 21* 21* 21*  CREATININE 0.93  --  1.00 0.89 0.91 0.91  CALCIUM 8.8*  --  9.5 9.3 9.3 8.9  MG  --   --  1.9 2.0 2.0 1.9  PHOS  --   --  3.6 4.3 3.9 4.1   Liver  Function Tests: Recent Labs  Lab 08/22/20 0931 08/23/20 0036 08/24/20 0257 08/25/20  0433  AST 16 13* 18 13*  ALT _0 ALKPHOS 48 49 41 42  BILITOT 1.0 0.9 1.4* 1.0  PROT 7.4 7.5 6.8 7.0  ALBUMIN 3.3* 3.2* 3.1* 3.1*   No results for input(s): LIPASE, AMYLASE in the last 168 hours. No results for input(s): AMMONIA in the last 168 hours. CBC: Recent Labs  Lab 08/20/20 0606 08/20/20 0912 08/22/20 0931 08/23/20 0036 08/24/20 0257 08/25/20 0433  WBC 4.6  --  3.5* 5.0 4.4 4.1  NEUTROABS 3.3  --  1.9 3.2 2.5 2.2  HGB 10.9* 11.6* 12.7 12.3 11.6* 11.6*  HCT 37.7 34.0* 39.6 40.0 37.5 36.0  MCV 100.0  --  93.4 93.2 94.5 91.8  PLT 161  --  174 181 170 168   Cardiac Enzymes: No results for input(s): CKTOTAL, CKMB, CKMBINDEX, TROPONINI in the last 168 hours. BNP: Invalid input(s): POCBNP CBG: Recent Labs  Lab 08/24/20 1943 08/25/20 0013 08/25/20 0423 08/25/20 0726 08/25/20 1148  GLUCAP 87 101* 100* 101* 110*   D-Dimer No results for input(s): DDIMER in the last 72 hours. Hgb A1c No results for input(s): HGBA1C in the last 72 hours. Lipid Profile No results for input(s): CHOL, HDL, LDLCALC, TRIG, CHOLHDL, LDLDIRECT in the last 72 hours. Thyroid function studies No results for input(s): TSH, T4TOTAL, T3FREE, THYROIDAB in the last 72 hours.  Invalid input(s): FREET3 Anemia work up Recent Labs    08/23/20 0036  VITAMINB12 107*  FOLATE 13.4  FERRITIN 142  TIBC 319  IRON 40  RETICCTPCT 2.2   Urinalysis    Component Value Date/Time   COLORURINE AMBER (A) 11/23/2019 0220   APPEARANCEUR CLOUDY (A) 11/23/2019 0220   LABSPEC 1.027 11/23/2019 0220   PHURINE 5.0 11/23/2019 0220   GLUCOSEU NEGATIVE 11/23/2019 0220   HGBUR NEGATIVE 11/23/2019 0220   BILIRUBINUR NEGATIVE 11/23/2019 0220   KETONESUR NEGATIVE 11/23/2019 0220   PROTEINUR 100 (A) 11/23/2019 0220   NITRITE NEGATIVE 11/23/2019 0220   LEUKOCYTESUR NEGATIVE 11/23/2019 0220   Sepsis Labs Invalid  input(s): PROCALCITONIN,  WBC,  LACTICIDVEN Microbiology Recent Results (from the past 240 hour(s))  SARS Coronavirus 2 by RT PCR (hospital order, performed in [facility] hospital lab) Nasopharyngeal Nasopharyngeal Swab     Status: None   Collection Time: 08/18/20  6:21 PM   Specimen: Nasopharyngeal Swab  Result Value Ref Range Status   SARS Coronavirus 2 NEGATIVE NEGATIVE Final    Comment: (NOTE) SARS-CoV-2 target nucleic acids are NOT DETECTED.  The SARS-CoV-2 RNA is generally detectable in upper and lower respiratory specimens during the acute phase of infection. The lowest concentration of SARS-CoV-2 viral copies this assay can detect is 250 copies / mL. A negative result does not preclude SARS-CoV-2 infection and should not be used as the sole basis for treatment or other patient management decisions.  A negative result may occur with improper specimen collection / handling, submission of specimen other than nasopharyngeal swab, presence of viral mutation(s) within the areas targeted by this assay, and inadequate number of viral copies (<250 copies / mL). A negative result must be combined with clinical observations, patient history, and epidemiological information.  Fact Sheet for Patients:   StrictlyIdeas.no  Fact Sheet for Healthcare Providers: BankingDealers.co.za  This test is not yet approved or  cleared by the [place] FDA and has been authorized for detection and/or diagnosis of SARS-CoV-2 by FDA under an Emergency Use Authorization (EUA).  This EUA will remain in effect (meaning this test can be  used) for the duration of the COVID-19 declaration under Section 564(b)(1) of the Act, 21 U.S.C. section 360bbb-3(b)(1), unless the authorization is terminated or revoked sooner.  Performed at [facility], [place] [address] [place], [place] [address]   MRSA PCR Screening     Status: None   Collection Time: 08/19/20  10:33 PM   Specimen: Nasal Mucosa; Nasopharyngeal  Result Value Ref Range Status   MRSA by PCR NEGATIVE NEGATIVE Final    Comment:        The GeneXpert MRSA Assay (FDA approved for NASAL specimens only), is one component of a comprehensive MRSA colonization surveillance program. It is not intended to diagnose MRSA infection nor to guide or monitor treatment for MRSA infections. Performed at [facility], [place] [address], [place], [place] [address]    Time coordinating discharge: 35 minutes  SIGNED:  Kerney Elbe, DO [facility] 08/25/2020, 6:57 PM Pager is on Kay  If 7PM-7AM, please contact night-coverage [email or web address]

## 2020-08-25 NOTE — Plan of Care (Signed)
  Problem: Education: Goal: Knowledge of General Education information will improve Description: Including pain rating scale, medication(s)/side effects and non-pharmacologic comfort measures 08/25/2020 0522 by Loma Messing, RN Outcome: Progressing 08/25/2020 0521 by Loma Messing, RN Outcome: Not Progressing   Problem: Health Behavior/Discharge Planning: Goal: Ability to manage health-related needs will improve 08/25/2020 0522 by Loma Messing, RN Outcome: Progressing 08/25/2020 0521 by Loma Messing, RN Outcome: Not Progressing   Problem: Clinical Measurements: Goal: Ability to maintain clinical measurements within normal limits will improve 08/25/2020 0522 by Loma Messing, RN Outcome: Progressing 08/25/2020 0521 by Loma Messing, RN Outcome: Not Progressing Goal: Will remain free from infection 08/25/2020 0522 by Loma Messing, RN Outcome: Progressing 08/25/2020 0521 by Loma Messing, RN Outcome: Not Progressing Goal: Diagnostic test results will improve 08/25/2020 0522 by Loma Messing, RN Outcome: Progressing 08/25/2020 0521 by Loma Messing, RN Outcome: Not Progressing Goal: Respiratory complications will improve 08/25/2020 0522 by Loma Messing, RN Outcome: Progressing 08/25/2020 0521 by Loma Messing, RN Outcome: Not Progressing Goal: Cardiovascular complication will be avoided 08/25/2020 0522 by Karsten Fells D, RN Outcome: Progressing 08/25/2020 0521 by Loma Messing, RN Outcome: Not Progressing   Problem: Activity: Goal: Risk for activity intolerance will decrease 08/25/2020 0522 by Karsten Fells D, RN Outcome: Progressing 08/25/2020 0521 by Loma Messing, RN Outcome: Not Progressing   Problem: Nutrition: Goal: Adequate nutrition will be maintained 08/25/2020 0522 by Loma Messing, RN Outcome: Progressing 08/25/2020 0521 by Loma Messing, RN Outcome: Not Progressing

## 2020-08-25 NOTE — Progress Notes (Signed)
SATURATION QUALIFICATIONS: (This note is used to comply with regulatory documentation for home oxygen)  Patient Saturations on Room Air at Rest = 95%  Patient Saturations on Room Air while Ambulating = 90%

## 2020-08-26 ENCOUNTER — Other Ambulatory Visit: Payer: Self-pay | Admitting: Internal Medicine

## 2020-08-26 MED ORDER — ALBUTEROL SULFATE HFA 108 (90 BASE) MCG/ACT IN AERS: 2.0000 | INHALATION_SPRAY | RESPIRATORY_TRACT | 0 refills | Status: DC | PRN

## 2020-08-26 MED ORDER — FUROSEMIDE 40 MG PO TABS: 40.0000 mg | ORAL_TABLET | Freq: Every day | ORAL | 0 refills | Status: DC

## 2020-08-26 NOTE — Progress Notes (Signed)
Apparently didn't not receive Lasix and Albuterol despite being sent to the pharmacy. Will reorder.

## 2020-08-27 ENCOUNTER — Telehealth: Payer: Self-pay

## 2020-08-27 NOTE — Telephone Encounter (Signed)
Transition Care Management Follow-up Telephone Call  Date of discharge and from where: 08/25/2020, [facility]   How have you been since you were released from the hospital? She said she is trying to get going again.   Any questions or concerns? Yes - she would like an order for a nasal mask for her CPAP.   Items Reviewed:  Did the pt receive and understand the discharge instructions provided? Yes   Medications obtained and verified? Yes  - she said she has all medications and did not have any questions about her med regime  Other? No   Any new allergies since your discharge? No   Do you have support at home? Yes   Home Care and Equipment/Supplies: Were home health services ordered? yes If so, what is the name of the agency? [facility]  Has the agency set up a time to come to the patient's home? No, not yet  Were any new equipment or medical supplies ordered?  Yes: walker, 3:1 commode and shower chair What is the name of the medical supply agency? [facility]  Were you able to get the supplies/equipment? yes Do you have any questions related to the use of the equipment or supplies? No   She already has a CPAP that she said she has been using regularly.   Functional Questionnaire: (I = Independent and D = Dependent) ADLs: independent.    Follow up appointments reviewed:   PCP Hospital f/u appt confirmed? Yes  [name], NP 08/28/2020.   [facility] f/u appt confirmed? she said that cardiology and pulmonology will be calling her to schedule follow up appointments.  .  Are transportation arrangements needed? No   If their condition worsens, is the pt aware to call PCP or go to the Emergency Dept.? Yes  Was the patient provided with contact information for the PCP's office or ED? Yes  Was to pt encouraged to call back with questions or concerns? Yes

## 2020-08-27 NOTE — Telephone Encounter (Signed)
Location of hospitalization: [facility] Reason for hospitalization: Shortness of breath Date of discharge: 08/25/2020 Date of first communication with patient: today Person contacting patient: Gaye Alken, CMA Current symptoms: none Do you understand why you were in the Hospital: Yes Questions regarding discharge instructions: None Where were you discharged to: Home Medications reviewed: Yes Allergies reviewed: Yes Dietary changes reviewed: Yes. Discussed low fat and low salt diet.  Referals reviewed: NA Activities of Daily Living: Able to with mild limitations Any transportation issues/concerns: None Any patient concerns: None Confirmed importance & date/time of Follow up appt: Yes, with [name], NP (patient's PCP) Confirmed with patient if condition begins to worsen call. Pt was given the office number and encouraged to call back with questions or concerns.  Do you want patient to schedule an appt to see you as well? Please advise.

## 2020-08-27 NOTE — Telephone Encounter (Signed)
Yes one of [place]

## 2020-08-28 ENCOUNTER — Other Ambulatory Visit: Payer: Self-pay

## 2020-08-28 ENCOUNTER — Encounter: Payer: Self-pay | Admitting: Nurse Practitioner

## 2020-08-28 ENCOUNTER — Ambulatory Visit: Payer: Medicaid Other | Attending: Nurse Practitioner | Admitting: Nurse Practitioner

## 2020-08-28 DIAGNOSIS — R0602 Shortness of breath: Secondary | ICD-10-CM

## 2020-08-28 DIAGNOSIS — E785 Hyperlipidemia, unspecified: Secondary | ICD-10-CM | POA: Diagnosis not present

## 2020-08-28 DIAGNOSIS — Z7901 Long term (current) use of anticoagulants: Secondary | ICD-10-CM

## 2020-08-28 DIAGNOSIS — D649 Anemia, unspecified: Secondary | ICD-10-CM | POA: Diagnosis not present

## 2020-08-28 DIAGNOSIS — Z09 Encounter for follow-up examination after completed treatment for conditions other than malignant neoplasm: Secondary | ICD-10-CM

## 2020-08-28 DIAGNOSIS — N939 Abnormal uterine and vaginal bleeding, unspecified: Secondary | ICD-10-CM

## 2020-08-28 DIAGNOSIS — I10 Essential (primary) hypertension: Secondary | ICD-10-CM

## 2020-08-28 MED ORDER — APIXABAN 5 MG PO TABS: 5.0000 mg | ORAL_TABLET | Freq: Two times a day (BID) | ORAL | 3 refills | Status: DC

## 2020-08-28 NOTE — Progress Notes (Incomplete)
Assessment & Plan:  There are no diagnoses linked to this encounter.  Patient has been counseled on age-appropriate routine health concerns for screening and prevention. These are reviewed and up-to-date. Referrals have been placed accordingly. Immunizations are up-to-date or declined.    Subjective:   Chief Complaint  Patient presents with  . Hospitalization Follow-up    Patient is here for hospital follow up.   HPI Angel Brewer 55 y.o. female presents to office today for HFU  has a past medical history of DVT (deep vein thrombosis) in pregnancy, Dyspnea, Morbid obesity ([place]), PE (pulmonary embolism) (02/29/2016), Peripheral vascular disease ([place]), and Sleep apnea.    Using cpap as prescribed. Needs new mas but does not want to to new study Gave dr Einar Gip number to [facility] Needs cxr 6 weeks ROS  Past Medical History:  Diagnosis Date  . DVT (deep vein thrombosis) in pregnancy    RLE DVT 02/2016  . Dyspnea   . Morbid obesity ([place])   . PE (pulmonary embolism) 02/29/2016  . Peripheral vascular disease ([place])   . Sleep apnea    uses a cpap    Past Surgical History:  Procedure Laterality Date  . CESAREAN SECTION     x 3  . OPEN REDUCTION INTERNAL FIXATION (ORIF) DISTAL RADIAL FRACTURE Right 06/20/2019   Procedure: OPEN REDUCTION INTERNAL FIXATION (ORIF) DISTAL RADIAL FRACTURE;  Surgeon: [name], MD;  Location: [facility];  Service: Orthopedics;  Laterality: Right;  REGIONAL BLOCK TOTAL SURGERY TIME: 90 MINUTES  . SYNOVECTOMY Right 11/21/2019   Procedure: RIGHT WRIST LIGAMENT RECONSTRUCTION;  Surgeon: [name], MD;  Location: [facility];  Service: Orthopedics;  Laterality: Right;  PROCEDURE: RIGHT WRIST LIGAMENT RECONSTRUCTION LENGTH OF SURGERY: 105 MIN    History reviewed. No pertinent family history.  Social History Reviewed with no changes to be made today.   Outpatient Medications Prior to Visit  Medication Sig Dispense Refill  . acetaminophen (TYLENOL) 325  MG tablet Take 2 tablets (650 mg total) by mouth every 6 (six) hours. (Patient taking differently: Take 650 mg by mouth every 6 (six) hours as needed for mild pain or headache.)    . albuterol (VENTOLIN HFA) 108 (90 Base) MCG/ACT inhaler Inhale 2 puffs into the lungs every 4 (four) hours as needed for wheezing or shortness of breath. 18 g 0  . Blood Pressure Monitor DEVI Take blood pressure daily 1 each 0  . furosemide (LASIX) 40 MG tablet Take 1 tablet (40 mg total) by mouth daily. 30 tablet 0  . guaiFENesin (MUCINEX) 600 MG 12 hr tablet Take 2 tablets (1,200 mg total) by mouth 2 (two) times daily for 5 days. 20 tablet 0  . polyethylene glycol (MIRALAX / GLYCOLAX) 17 g packet Take 17 g by mouth daily. 14 each 0  . PRESCRIPTION MEDICATION CPAP- At bedtime    . senna-docusate (SENOKOT-S) 8.6-50 MG tablet Take 1 tablet by mouth at bedtime as needed for mild constipation. 30 tablet 0  . apixaban (ELIQUIS) 5 MG TABS tablet Take 1 tablet (5 mg total) by mouth 2 (two) times daily. 120 tablet 1   No facility-administered medications prior to visit.    No Known Allergies     Objective:    LMP 11/29/2015  Wt Readings from Last 3 Encounters:  08/20/20 (!) 412 lb 14.8 oz (187.3 kg)  02/09/20 (!) 403 lb (182.8 kg)  11/27/19 (!) 394 lb (178.7 kg)    Physical Exam       Patient  has been counseled extensively about nutrition and exercise as well as the importance of adherence with medications and regular follow-up. The patient was given clear instructions to go to ER or return to medical center if symptoms don't improve, worsen or new problems develop. The patient verbalized understanding.   Follow-up: No follow-ups on file.   [name], FNP-BC [facility] [place], [place]   08/28/2020, 2:56 PM

## 2020-08-28 NOTE — Telephone Encounter (Signed)
Please contact patient to schedule appointment. Thank you.

## 2020-08-30 ENCOUNTER — Ambulatory Visit: Payer: Medicaid Other | Admitting: Cardiology

## 2020-08-30 ENCOUNTER — Encounter: Payer: Self-pay | Admitting: Cardiology

## 2020-08-30 ENCOUNTER — Other Ambulatory Visit: Payer: Self-pay

## 2020-08-30 VITALS — BP 132/76 | HR 65 | Temp 97.3°F | Ht 68.0 in | Wt 393.0 lb

## 2020-08-30 DIAGNOSIS — Z6841 Body Mass Index (BMI) 40.0 and over, adult: Secondary | ICD-10-CM

## 2020-08-30 DIAGNOSIS — E7800 Pure hypercholesterolemia, unspecified: Secondary | ICD-10-CM

## 2020-08-30 DIAGNOSIS — Z86718 Personal history of other venous thrombosis and embolism: Secondary | ICD-10-CM | POA: Diagnosis not present

## 2020-08-30 DIAGNOSIS — G4733 Obstructive sleep apnea (adult) (pediatric): Secondary | ICD-10-CM | POA: Diagnosis not present

## 2020-08-30 DIAGNOSIS — Z86711 Personal history of pulmonary embolism: Secondary | ICD-10-CM | POA: Diagnosis not present

## 2020-08-30 DIAGNOSIS — R0609 Other forms of dyspnea: Secondary | ICD-10-CM

## 2020-08-30 DIAGNOSIS — R9431 Abnormal electrocardiogram [ECG] [EKG]: Secondary | ICD-10-CM

## 2020-08-30 NOTE — Progress Notes (Signed)
Date:  08/30/2020   ID:  [name], DOB [birth year], MRN [id number]  PCP:  [name], NP  Cardiologist:  Rex Kras, DO, [facility] (established care 08/30/2020)  REASON FOR CONSULT: Shortness of breath  REQUESTING PHYSICIAN:  [name], NP [place],  [place] [address]  Chief Complaint  Patient presents with  . Shortness of Breath  . New Patient (Initial Visit)    HPI  Angel Brewer is a 55 y.o. female who presents to the office with a chief complaint of " shortness of breath." Patient's past medical history and cardiovascular risk factors include:  Hx of DVT and PE x2 (per patient), obesity, Sleep Apnea on BiPAP, former smoker.  She is referred to the office at the request of [name], NP for evaluation of shortness of breath.  Patient was recently hospitalized at [facility] between August 18, 2020-August 25, 2020 for shortness of breath.  Discharge summary reviewed.  Patient states that she was having altered mental status and difficulty breathing secondary to hypercarbic respiratory failure as she was not using her BiPAP on a regular basis.  Patient was managed and stabilized by critical care medicine and then transferred to hospitalist team and at the time of discharge it was recommended to follow-up with cardiology.  Patient denies any chest pain at rest or with effort related activities.  Patient continues to have shortness of breath but it is significantly improved since her discharge.  And she states that is back to baseline.  She states that majority of the shortness of breath is most likely secondary to deconditioning and underlying obesity.  She works long hours at [employer] and is physically active.  She does not participate in any exercise program.  Review of electronic medical records patient has been BNP on admission was 30.6 and at the time of discharge it was 13.2.  Echocardiogram noted preserved LVEF without any significant valvular  heart disease.  FUNCTIONAL STATUS: Working at [employer] that keeps her busy. No structured exercise program or daily routine.   ALLERGIES: No Known Allergies  MEDICATION LIST PRIOR TO VISIT: Current Meds  Medication Sig  . acetaminophen (TYLENOL) 325 MG tablet Take 2 tablets (650 mg total) by mouth every 6 (six) hours. (Patient taking differently: Take 650 mg by mouth every 6 (six) hours as needed for mild pain or headache.)  . albuterol (VENTOLIN HFA) 108 (90 Base) MCG/ACT inhaler Inhale 2 puffs into the lungs every 4 (four) hours as needed for wheezing or shortness of breath.  Marland Kitchen apixaban (ELIQUIS) 5 MG TABS tablet Take 1 tablet (5 mg total) by mouth 2 (two) times daily.  . Blood Pressure Monitor DEVI Take blood pressure daily  . furosemide (LASIX) 40 MG tablet Take 1 tablet (40 mg total) by mouth daily.  Marland Kitchen guaiFENesin (MUCINEX) 600 MG 12 hr tablet Take 2 tablets (1,200 mg total) by mouth 2 (two) times daily for 5 days.  . polyethylene glycol (MIRALAX / GLYCOLAX) 17 g packet Take 17 g by mouth daily.  Marland Kitchen PRESCRIPTION MEDICATION CPAP- At bedtime  . senna-docusate (SENOKOT-S) 8.6-50 MG tablet Take 1 tablet by mouth at bedtime as needed for mild constipation.     PAST MEDICAL HISTORY: Past Medical History:  Diagnosis Date  . DVT (deep vein thrombosis) in pregnancy    RLE DVT 02/2016  . Dyspnea   . Morbid obesity ([place])   . PE (pulmonary embolism) 02/29/2016  . Sleep apnea    uses a  cpap    PAST SURGICAL HISTORY: Past Surgical History:  Procedure Laterality Date  . CESAREAN SECTION     x 3  . OPEN REDUCTION INTERNAL FIXATION (ORIF) DISTAL RADIAL FRACTURE Right 06/20/2019   Procedure: OPEN REDUCTION INTERNAL FIXATION (ORIF) DISTAL RADIAL FRACTURE;  Surgeon: [name], MD;  Location: [place];  Service: Orthopedics;  Laterality: Right;  REGIONAL BLOCK TOTAL SURGERY TIME: 90 MINUTES  . SYNOVECTOMY Right 11/21/2019   Procedure: RIGHT WRIST LIGAMENT RECONSTRUCTION;  Surgeon: [name], MD;  Location: [place];  Service: Orthopedics;  Laterality: Right;  PROCEDURE: RIGHT WRIST LIGAMENT RECONSTRUCTION LENGTH OF SURGERY: 105 MIN    FAMILY HISTORY: The patient family history includes Hypertension in her sister.  SOCIAL HISTORY:  The patient  reports that she has quit smoking. She has never used smokeless tobacco. She reports that she does not drink alcohol and does not use drugs.  REVIEW OF SYSTEMS: Review of Systems  Constitutional: Negative for chills and fever.  HENT: Negative for hoarse voice and nosebleeds.   Eyes: Negative for discharge, double vision and pain.  Cardiovascular: Positive for dyspnea on exertion. Negative for chest pain, claudication, leg swelling, near-syncope, orthopnea, palpitations, paroxysmal nocturnal dyspnea and syncope.  Respiratory: Positive for shortness of breath. Negative for hemoptysis.   Musculoskeletal: Negative for muscle cramps and myalgias.  Gastrointestinal: Negative for abdominal pain, constipation, diarrhea, hematemesis, hematochezia, melena, nausea and vomiting.  Neurological: Negative for dizziness and light-headedness.    PHYSICAL EXAM: Vitals with BMI 08/30/2020 08/25/2020 08/25/2020  Height _0  - -  Weight 393 lbs - -  BMI 16.07 - -  Systolic 371 062 694  Diastolic 76 79 67  Pulse 65 68 50    CONSTITUTIONAL: Appears older than stated age, hemodynamically stable, no acute distress.  SKIN: Skin is warm and dry. No rash noted. No cyanosis. No pallor. No jaundice HEAD: Normocephalic and atraumatic.  EYES: No scleral icterus, xanthomas MOUTH/THROAT: Moist oral membranes.  NECK: Short neck stature unable to evaluate JVP.  Patient is unable to lay supine due to physical limitations.  No thyromegaly noted. No carotid bruits  LYMPHATIC: No visible cervical adenopathy.  CHEST Normal respiratory effort. No intercostal retractions  LUNGS: Decreased breath sounds at the bases.  No stridor. No wheezes. No rales.   CARDIOVASCULAR: Regular, positive S1-S2, no murmurs rubs or gallops appreciated. ABDOMINAL: Morbid obesity, positive bowel sounds in all 4 quadrants, soft, nontender, nondistended, no apparent ascites.  EXTREMITIES: Dry/scaly skin, no peripheral edema  HEMATOLOGIC: No significant bruising NEUROLOGIC: Oriented to person, place, and time. Nonfocal. Normal muscle tone.  PSYCHIATRIC: Normal mood and affect. Normal behavior. Cooperative  RADIOLOGY: CT chest PE protocol 08/18/2020: Cardiovascular: The heart is enlarged. No pericardial effusion. No visible coronary artery calcification. Some aortic atherosclerotic calcification is present. Pulmonary arteries are prominent consistent with pulmonary arterial hypertension. Pulmonary arterial opacification is moderate. No pulmonary emboli are seen. Small distal emboli might be inapparent. No large or central emboli. Impression: 1. No large or central pulmonary emboli. Pulmonary arterial opacification is only moderate. Small distal emboli might be inapparent. 2. Cardiomegaly. Enlarged pulmonary arteries consistent with pulmonary arterial hypertension. 3. Chronic enlargement of right hilar, subcarinal and paratracheal lymph nodes. 4. Chronic mosaic attenuation pattern of the lungs as seen chronically. Seen in association with adenopathy, sarcoid could possibly give this appearance. 5. Aortic atherosclerosis.  CARDIAC DATABASE: EKG: 08/30/2020: Normal sinus rhythm, 67 bpm, normal axis, T wave inversions in leads III, aVF, V1 through V3 consider ischemia, without underlying  injury pattern.  Similar findings noted on prior EKG dated 11/28/2019.  Echocardiogram: 08/19/2020: 1. Left ventricular ejection fraction, by estimation, is 60 to 65%. The left ventricle has normal function. The left ventricle has no regional wall motion abnormalities. There is mild left ventricular hypertrophy.  Left ventricular diastolic function could not be evaluated.  2. Right  ventricular systolic function was not well visualized. The right ventricular size is not well visualized.  3. The mitral valve was not assessed. No evidence of mitral valve regurgitation.  4. The aortic valve was not well visualized. Aortic valve regurgitation is not visualized.   Comparison(s): Incomplete echo - patient emergently during the echo to CT for possible stroke. Echo windows are poor- consider limited follow-up echo with Definity contrast if necessary.   Echo 11/23/2019: 1. Left ventricular ejection fraction, by estimation, is 55 to 60%. The left ventricle has normal function. The left ventricle has no regional wall motion abnormalities. Left ventricular diastolic parameters are  consistent with Grade I diastolic dysfunction (impaired relaxation).  2. Right ventricular systolic function is moderately reduced. The right ventricular size is moderately enlarged. There is moderately elevated pulmonary artery systolic pressure. The estimated right ventricular systolic pressure is 96.4 mmHg.  3. Left atrial size was mildly dilated.  4. Right atrial size was mildly dilated.  5. The mitral valve is normal in structure. Mild mitral valve regurgitation. No evidence of mitral stenosis.  6. The aortic valve is normal in structure. Aortic valve regurgitation is not visualized. No aortic stenosis is present.  7. The inferior vena cava is dilated in size with <50% respiratory variability, suggesting right atrial pressure of 15 mmHg.   Stress Testing: NA  Heart Catheterization: NA  LABORATORY DATA: CBC Latest Ref Rng & Units 08/25/2020 08/24/2020 08/23/2020  WBC 4.0 - 10.5 K/uL 4.1 4.4 5.0  Hemoglobin 12.0 - 15.0 g/dL 11.6(L) 11.6(L) 12.3  Hematocrit 36.0 - 46.0 % 36.0 37.5 40.0  Platelets 150 - 400 K/uL 168 170 181    CMP Latest Ref Rng & Units 08/25/2020 08/24/2020 08/23/2020  Glucose 70 - 99 mg/dL 91 85 100(H)  BUN 6 - 20 mg/dL 21(H) 21(H) 21(H)  Creatinine 0.44 - 1.00 mg/dL 0.91  0.91 0.89  Sodium 135 - 145 mmol/L 136 138 138  Potassium 3.5 - 5.1 mmol/L 3.7 4.1 4.2  Chloride 98 - 111 mmol/L 95(L) 97(L) 96(L)  CO2 22 - 32 mmol/L 32 33(H) 31  Calcium 8.9 - 10.3 mg/dL 8.9 9.3 9.3  Total Protein 6.5 - 8.1 g/dL 7.0 6.8 7.5  Total Bilirubin 0.3 - 1.2 mg/dL 1.0 1.4(H) 0.9  Alkaline Phos 38 - 126 U/L 42 41 49  AST 15 - 41 U/L 13(L) 18 13(L)  ALT 0 - 44 U/L _0 Lipid Panel     Component Value Date/Time   CHOL 178 08/25/2017 1010   TRIG 83 08/25/2017 1010   HDL 41 08/25/2017 1010   CHOLHDL 4.3 08/25/2017 1010   LDLCALC 120 (H) 08/25/2017 1010   LABVLDL 17 08/25/2017 1010    No components found for: NTPROBNP No results for input(s): PROBNP in the last 8760 hours. No results for input(s): TSH in the last 8760 hours.  BMP Recent Labs    11/26/19 1017 11/30/19 0348 02/09/20 1017 08/18/20 0246 08/23/20 0036 08/24/20 0257 08/25/20 0433  NA 143 141 141   < > 138 138 136  K 4.0 3.7 4.2   < > 4.2 4.1 3.7  CL 93* 96*  106   < > 96* 97* 95*  CO2 40* 33* 25   < > 31 33* 32  GLUCOSE 92 93 79   < > 100* 85 91  BUN _0 < > 21* 21* 21*  CREATININE 0.98 0.95 1.01*   < > 0.89 0.91 0.91  CALCIUM 9.5 9.0 9.0   < > 9.3 9.3 8.9  GFRNONAA >60 >60 63   < > >60 >60 >60  GFRAA >60 >60 73  --   --   --   --    < > = values in this interval not displayed.    HEMOGLOBIN A1C Lab Results  Component Value Date   HGBA1C 5.4 06/10/2016    IMPRESSION:    ICD-10-CM   1. Other form of dyspnea  R06.09 EKG 12-Lead    CANCELED: CT CARDIAC SCORING (SELF PAY ONLY)  2. Nonspecific abnormal electrocardiogram (ECG) (EKG)  R94.31   3. Hypercholesterolemia  E78.00 Lipid Panel With LDL/HDL Ratio    LDL cholesterol, direct    Comp. Metabolic Panel (12)    CANCELED: CT CARDIAC SCORING (SELF PAY ONLY)  4. OSA treated with BiPAP  G47.33   5. Hx of deep venous thrombosis  Z86.718   6. Hx pulmonary embolism  Z86.711   7. Class 3 severe obesity due to excess calories with  serious comorbidity and body mass index (BMI) of 50.0 to 59.9 in adult [facility])  E66.01    Z68.43      RECOMMENDATIONS: Tamee Forero is a 55 y.o. female whose past medical history and cardiac risk factors include: Hx of DVT and PE x2 (per patient), obesity, Sleep Apnea on BiPAP, former smoker.  Patient is referred to the office for further evaluation of shortness of breath.  I believe her shortness of breath is multifactorial and not completely cardiac in origin.  Recent hospitalization noted acute respiratory failure due to hypercapnia as she was non-compliant with her BiPAP prior ER visit. She has morbid obesity which may be contributing to hypoventilatory syndrome as well.  Recent echo was suboptimal but prior echo notes grade 1 diastolic impairment. Clinically she is not volume overload, not able to assess JVP on physical examination as she is unable to lay supine due to physical limitations, and BNP not significantly elevated. Currently on lasix.   CT scan that was done to rule out pulmonary embolism. However, it noted dilated pulmonary arteries which may be nonspecific finding given her elevated BSA.  Most recent echocardiogram was suboptimal due to technically difficult study.  However, prior echocardiograms of noted RV systolic function to be moderately reduced, RV size moderately enlarged, and elevated PASP. She may benefit from right heart catheterization to rule out pulmonary hypertension; however, the shared decision was to first rule out underlying ischemia.  We will repeat an echocardiogram.  Given her shortness of breath, EKG findings, uncontrolled hyperlipidemia, patient would benefit from an ischemic evaluation.  She has not had one in the recent past. She is not a exercise treadmill stress test candidate due to her morbid obesity.  Prior to ordering coronary CTA versus nuclear stress test I will have to find out the weight limitations on such equipment. I will have the office reach out to  [facility] regarding this prior to ordering the test. She verbalizes understanding.  I initially had ordered a coronary calcium score for further risk stratification.  However based on the CT of the chest for PE protocol per report  no significant coronary artery calcification noted.  I will discontinue the order.  Patient is also encouraged to follow-up with pulmonary medicine as a recent CT chest noted the possibility of possible sarcoid.  This may also be contributing to her underlying shortness of breath.  We will check a fasting lipid profile.  FINAL MEDICATION LIST END OF ENCOUNTER: No orders of the defined types were placed in this encounter.   There are no discontinued medications.   Current Outpatient Medications:  .  acetaminophen (TYLENOL) 325 MG tablet, Take 2 tablets (650 mg total) by mouth every 6 (six) hours. (Patient taking differently: Take 650 mg by mouth every 6 (six) hours as needed for mild pain or headache.), Disp: , Rfl:  .  albuterol (VENTOLIN HFA) 108 (90 Base) MCG/ACT inhaler, Inhale 2 puffs into the lungs every 4 (four) hours as needed for wheezing or shortness of breath., Disp: 18 g, Rfl: 0 .  apixaban (ELIQUIS) 5 MG TABS tablet, Take 1 tablet (5 mg total) by mouth 2 (two) times daily., Disp: 180 tablet, Rfl: 3 .  Blood Pressure Monitor DEVI, Take blood pressure daily, Disp: 1 each, Rfl: 0 .  furosemide (LASIX) 40 MG tablet, Take 1 tablet (40 mg total) by mouth daily., Disp: 30 tablet, Rfl: 0 .  guaiFENesin (MUCINEX) 600 MG 12 hr tablet, Take 2 tablets (1,200 mg total) by mouth 2 (two) times daily for 5 days., Disp: 20 tablet, Rfl: 0 .  polyethylene glycol (MIRALAX / GLYCOLAX) 17 g packet, Take 17 g by mouth daily., Disp: 14 each, Rfl: 0 .  PRESCRIPTION MEDICATION, CPAP- At bedtime, Disp: , Rfl:  .  senna-docusate (SENOKOT-S) 8.6-50 MG tablet, Take 1 tablet by mouth at bedtime as needed for mild constipation., Disp: 30 tablet, Rfl: 0  Orders Placed This  Encounter  Procedures  . Lipid Panel With LDL/HDL Ratio  . LDL cholesterol, direct  . Comp. Metabolic Panel (12)  . EKG 12-Lead    There are no Patient Instructions on file for this visit.   --Continue cardiac medications as reconciled in final medication list. --Return in 4 weeks (on 09/27/2020) for Follow up, Dyspnea. Or sooner if needed. --Continue follow-up with your primary care physician regarding the management of your other chronic comorbid conditions.  Patient's questions and concerns were addressed to her satisfaction. She voices understanding of the instructions provided during this encounter.   This note was created using a voice recognition software as a result there may be grammatical errors inadvertently enclosed that do not reflect the nature of this encounter. Every attempt is made to correct such errors.  Rex Kras, [place], [facility]  Pager: [phone] Office: [phone]

## 2020-09-02 ENCOUNTER — Other Ambulatory Visit: Payer: Self-pay | Admitting: Nurse Practitioner

## 2020-09-02 DIAGNOSIS — G4733 Obstructive sleep apnea (adult) (pediatric): Secondary | ICD-10-CM

## 2020-09-02 DIAGNOSIS — Z9989 Dependence on other enabling machines and devices: Secondary | ICD-10-CM

## 2020-09-02 MED ORDER — MISC. DEVICES MISC: 0 refills | Status: DC

## 2020-09-02 NOTE — Telephone Encounter (Signed)
Please fax script to [facility]

## 2020-09-03 ENCOUNTER — Encounter: Payer: Self-pay | Admitting: Nurse Practitioner

## 2020-09-03 MED ORDER — APIXABAN 5 MG PO TABS: 5.0000 mg | ORAL_TABLET | Freq: Two times a day (BID) | ORAL | 3 refills | Status: DC

## 2020-09-03 MED ORDER — ASPIRIN EC 81 MG PO TBEC: 81.0000 mg | DELAYED_RELEASE_TABLET | Freq: Every day | ORAL | 0 refills | Status: DC

## 2020-09-03 MED ORDER — APIXABAN 5 MG PO TABS: 5.0000 mg | ORAL_TABLET | Freq: Two times a day (BID) | ORAL | 1 refills | Status: DC

## 2020-09-03 MED ORDER — RIVAROXABAN 20 MG PO TABS: 20.0000 mg | ORAL_TABLET | Freq: Every day | ORAL | 3 refills | Status: DC

## 2020-09-03 NOTE — Progress Notes (Signed)
Virtual Visit via Telephone Note Due to national recommendations of social distancing due to [place] 19, telehealth visit is felt to be most appropriate for this patient at this time.  I discussed the limitations, risks, security and privacy concerns of performing an evaluation and management service by telephone and the availability of in person appointments. I also discussed with the patient that there may be a patient responsible charge related to this service. The patient expressed understanding and agreed to proceed.    I connected with [place] on 09/03/20  at   9:10 AM EST  EDT by telephone and verified that I am speaking with the correct person using two identifiers.   Consent I discussed the limitations, risks, security and privacy concerns of performing an evaluation and management service by telephone and the availability of in person appointments. I also discussed with the patient that there may be a patient responsible charge related to this service. The patient expressed understanding and agreed to proceed.   Location of Patient: Private  Residence   Location of Provider: [place] and [place] participating in Telemedicine visit: [name] FNP-BC [place]    History of Present Illness: Telemedicine visit for: HFU She has a past medical history of RLE DVT and PE 2017, recurrent RLE DVT 2020, Dyspnea, Morbid obesity ([place]), PE (pulmonary embolism) (02/29/2016), and Sleep apnea. Lifelong xarelto.   In regard to NOAC: PER ONCOLOGY 11-14-2019 --at this time I would recommend a full 6 months of anticoagulation therapy. This would end in approximately January 09, 2020.  --once patient has completed Xarelto therapy would recommend starting ASA 41m x 4 additional months as long term VTE prophylaxis post operatively. Since then she has been restarted on NOAC   HFU She was admitted to the hospital for 7 days with acute on chronic  hypercarbia and encephalopathy. She had not been wearing her CPAP mask due to poor fit. Required BiPAP and IV diuresis during her admission. Discharged home in stable condition with instructions to follow up with Cardiology and Pulmonology for OSA/OHS and CPAP titration. Today she states she is trying to use her CPAP however the mask has leaks in it. She is requesting a nasal mask but does not want to have a new Sleep study performed.  She has not scheduled with Dr GEinar GipCardiology as of today. I did give her their office number to schedule.  She was instructed to have her chest xray completed in 6 weeks and I will be referring to pulmonology for possible sarcoids noted on imaging and she does note chronic shortness of breath. Currently not using any supplemental O2 during the day.   Also noted for vaginal bleeding while in hospital. Started on megace by GYN and instructed to follow up with GYN for pelvic [place] Other complications noted were for anemia and B12 deficiency which she was given B12 supplementation.  She was switched to Eliquis from xarelto by pulmonology on 02-09-2020 due to habitus.   Past Medical History:  Diagnosis Date  . DVT (deep vein thrombosis) in pregnancy    RLE DVT 02/2016  . Dyspnea   . [place]bid [place] ([place]   . PE (pulmonary embolism) 02/29/2016  . Sleep apnea    uses a cpap    Past Surgical History:  Procedure Laterality Date  . CESAREAN SECTION     x 3  . OPEN REDUCTION INTERNAL FIXATION (ORIF) DISTAL RADIAL FRACTURE Right 06/20/2019   Procedure:  OPEN REDUCTION INTERNAL FIXATION (ORIF) DISTAL RADIAL FRACTURE;  Surgeon: [name], MD;  Location: [place];  Service: Orthopedics;  Laterality: Right;  REGIONAL BLOCK TOTAL SURGERY TIME: 90 MINUTES  . SYNOVECTOMY Right 11/21/2019   Procedure: RIGHT WRIST LIGAMENT RECONSTRUCTION;  Surgeon: [name], MD;  Location: [place];  Service: Orthopedics;  Laterality: Right;  PROCEDURE: RIGHT WRIST LIGAMENT RECONSTRUCTION LENGTH  OF SURGERY: 105 MIN    Family History  Problem Relation Age of Onset  . Hypertension Sister     Social History   Socioeconomic History  . Marital status: Single    Spouse name: Not on file  . Number of children: 3  . Years of education: Not on file  . Highest education level: Not on file  Occupational History  . Not on file  Tobacco Use  . Smoking status: Former Research scientist (life sciences)  . Smokeless tobacco: Never Used  . Tobacco comment: smoked only as a teenager  Vaping Use  . Vaping Use: Never used  Substance and Sexual Activity  . Alcohol use: No  . Drug use: No  . Sexual activity: Not on file  Other Topics Concern  . Not on file  Social History Narrative  . Not on file   Social Determinants of Health   Financial Resource Strain: Not on file  Food Insecurity: Not on file  Transportation Needs: Not on file  Physical Activity: Not on file  Stress: Not on file  Social Connections: Not on file     Observations/Objective: Awake, alert and oriented x 3   Review of Systems  Constitutional: Negative for fever, malaise/fatigue and weight loss.  HENT: Negative.  Negative for nosebleeds.   Eyes: Negative.  Negative for blurred vision, double vision and photophobia.  Respiratory: Positive for shortness of breath. Negative for cough.   Cardiovascular: Negative.  Negative for chest pain, palpitations and leg swelling.  Gastrointestinal: Negative.  Negative for heartburn, nausea and vomiting.  Genitourinary:       AUB  Musculoskeletal: Negative.  Negative for myalgias.  Neurological: Negative.  Negative for dizziness, focal weakness, seizures and headaches.  Psychiatric/Behavioral: Negative.  Negative for suicidal ideas.    Assessment and Plan: Magdelyn was seen today for hospitalization follow-up.  Diagnoses and all orders for this visit:  Hospital discharge follow-up  Shortness of breath -     Ambulatory referral to Pulmonology -     DG Chest 2 View; Future  Abnormal uterine  bleeding (AUB) -     US PELVIC COMPLETE WITH TRANSVAGINAL; Future  Dyslipidemia, goal LDL below 100 -     Lipid panel; Future  Essential hypertension -     CMP14+EGFR; Future  Anemia, unspecified type -     CBC with Differential; Future  Chronic anticoagulation -     apixaban (ELIQUIS) 5 MG TABS tablet; Take 1 tablet (5 mg total) by mouth 2 (two) times daily.tablet (20 mg total) by mouth daily with supper.     Follow Up Instructions Return in about 3 months (around 11/25/2020).     I discussed the assessment and treatment plan with the patient. The patient was provided an opportunity to ask questions and all were answered. The patient agreed with the plan and demonstrated an understanding of the instructions.   The patient was advised to call back or seek an in-person evaluation if the symptoms worsen or if the condition fails to improve as anticipated.  I provided 18 minutes of non-face-to-face time during this encounter including median intraservice time,  reviewing previous notes, labs, imaging, medications and explaining diagnosis and management.  [name], FNP-BC

## 2020-09-04 NOTE — Telephone Encounter (Signed)
CMA faxed to [facility].

## 2020-09-07 ENCOUNTER — Ambulatory Visit: Payer: Medicaid Other | Attending: Nurse Practitioner

## 2020-09-07 ENCOUNTER — Other Ambulatory Visit: Payer: Self-pay

## 2020-09-07 DIAGNOSIS — E785 Hyperlipidemia, unspecified: Secondary | ICD-10-CM | POA: Diagnosis not present

## 2020-09-07 DIAGNOSIS — D649 Anemia, unspecified: Secondary | ICD-10-CM

## 2020-09-07 DIAGNOSIS — I10 Essential (primary) hypertension: Secondary | ICD-10-CM | POA: Diagnosis not present

## 2020-09-08 LAB — CMP14+EGFR
ALT: 8 IU/L (ref 0–32)
AST: 13 IU/L (ref 0–40)
Albumin/Globulin Ratio: 1.1 — ABNORMAL LOW (ref 1.2–2.2)
Albumin: 4 g/dL (ref 3.8–4.9)
Alkaline Phosphatase: 61 IU/L (ref 44–121)
BUN/Creatinine Ratio: 14 (ref 9–23)
BUN: 15 mg/dL (ref 6–24)
Bilirubin Total: 0.3 mg/dL (ref 0.0–1.2)
CO2: 23 mmol/L (ref 20–29)
Calcium: 9.7 mg/dL (ref 8.7–10.2)
Chloride: 100 mmol/L (ref 96–106)
Creatinine, Ser: 1.07 mg/dL — ABNORMAL HIGH (ref 0.57–1.00)
GFR calc Af Amer: 68 mL/min/{1.73_m2} (ref >59)
GFR calc non Af Amer: 59 mL/min/{1.73_m2} — ABNORMAL LOW (ref >59)
Globulin, Total: 3.7 g/dL (ref 1.5–4.5)
Glucose: 80 mg/dL (ref 65–99)
Potassium: 4.5 mmol/L (ref 3.5–5.2)
Sodium: 142 mmol/L (ref 134–144)
Total Protein: 7.7 g/dL (ref 6.0–8.5)

## 2020-09-08 LAB — CBC WITH DIFFERENTIAL/PLATELET
Basophils Absolute: 0.1 10*3/uL (ref 0.0–0.2)
Basos: 1 %
EOS (ABSOLUTE): 0.2 10*3/uL (ref 0.0–0.4)
Eos: 3 %
Hematocrit: 38.7 % (ref 34.0–46.6)
Hemoglobin: 12.2 g/dL (ref 11.1–15.9)
Immature Grans (Abs): 0.1 10*3/uL (ref 0.0–0.1)
Immature Granulocytes: 1 %
Lymphocytes Absolute: 1.6 10*3/uL (ref 0.7–3.1)
Lymphs: 33 %
MCH: 28.6 pg (ref 26.6–33.0)
MCHC: 31.5 g/dL (ref 31.5–35.7)
MCV: 91 fL (ref 79–97)
Monocytes Absolute: 0.4 10*3/uL (ref 0.1–0.9)
Monocytes: 9 %
Neutrophils Absolute: 2.6 10*3/uL (ref 1.4–7.0)
Neutrophils: 53 %
Platelets: 278 10*3/uL (ref 150–450)
RBC: 4.27 x10E6/uL (ref 3.77–5.28)
RDW: 12.7 % (ref 11.7–15.4)
WBC: 4.8 10*3/uL (ref 3.4–10.8)

## 2020-09-08 LAB — LIPID PANEL
Chol/HDL Ratio: 4.9 ratio — ABNORMAL HIGH (ref 0.0–4.4)
Cholesterol, Total: 212 mg/dL — ABNORMAL HIGH (ref 100–199)
HDL: 43 mg/dL (ref >39)
LDL Chol Calc (NIH): 154 mg/dL — ABNORMAL HIGH (ref 0–99)
Triglycerides: 82 mg/dL (ref 0–149)
VLDL Cholesterol Cal: 15 mg/dL (ref 5–40)

## 2020-09-10 ENCOUNTER — Telehealth: Payer: Self-pay | Admitting: Nurse Practitioner

## 2020-09-10 NOTE — Telephone Encounter (Signed)
Copied from [place] [phone]. Topic: Quick Communication - See Telephone Encounter >> Sep 10, 2020  3:39 PM Loma [place] wrote: CRM for notification. See Telephone encounter for: 09/10/20.Pt is upset b/c told to get on MyChart for lab results, tried to help her but really frustrated, states DOB coming up wrong but is what is in Epic please FU as all she wants is her lab results/not released to pec to give. CB at [phone]

## 2020-09-10 NOTE — Telephone Encounter (Signed)
Pt aware of results and voiced understanding, all questions answered/concerns addressed. Also aware to stop Eliquis and take OTC aspirin for 4 wks and then follow-up.

## 2020-09-11 ENCOUNTER — Telehealth: Payer: Self-pay | Admitting: Nurse Practitioner

## 2020-09-11 NOTE — Telephone Encounter (Signed)
Pt is calling to confirm she suppose to stop taking blood thinner and take asa. Pt also needs chole med to be sent [facility] [address] phone number [phone]

## 2020-09-12 ENCOUNTER — Other Ambulatory Visit: Payer: Self-pay | Admitting: Nurse Practitioner

## 2020-09-12 MED ORDER — ATORVASTATIN CALCIUM 20 MG PO TABS: 20.0000 mg | ORAL_TABLET | Freq: Every day | ORAL | 3 refills | Status: DC

## 2020-09-12 NOTE — Telephone Encounter (Signed)
Pt is needing cholesterol medication [facility] [place].

## 2020-09-24 ENCOUNTER — Other Ambulatory Visit: Payer: Self-pay | Admitting: Nurse Practitioner

## 2020-09-24 NOTE — Telephone Encounter (Signed)
Requested medication (s) are due for refill today: yes  Requested medication (s) are on the active medication list: yes  Last refill:  08/26/20 #30 0 refills  Future visit scheduled: yes in 2 months  Notes to clinic:  do you want to refill Rx? Written by Alfredia Ferguson DO hospitalist, or have patient contact cardiology?     Requested Prescriptions  Pending Prescriptions Disp Refills   furosemide (LASIX) 40 MG tablet [Pharmacy Med Name: FUROSEMIDE 40 MG TABLET] 30 tablet 0    Sig: TAKE 1 TABLET BY MOUTH EVERY DAY      Cardiovascular:  Diuretics - Loop Failed - 09/24/2020  3:50 PM      Failed - Cr in normal range and within 360 days    Creatinine  Date Value Ref Range Status  11/14/2019 0.81 0.44 - 1.00 mg/dL Final   Creat  Date Value Ref Range Status  09/09/2016 0.96 0.50 - 1.05 mg/dL Final    Comment:      For patients > or = 55 years of age: The upper reference limit for Creatinine is approximately 13% higher for people identified as African-American.      Creatinine, Ser  Date Value Ref Range Status  09/07/2020 1.07 (H) 0.57 - 1.00 mg/dL Final    Comment:                   **Effective September 10, 2020 [facility] will begin**                  reporting the 2021 CKD-EPI creatinine equation that                  estimates kidney function without a race variable.    Creatinine, POC  Date Value Ref Range Status  02/16/2017 300 mg/dL Final   Creatinine, Urine  Date Value Ref Range Status  11/23/2019 169.75 mg/dL Final    Comment:    Performed at [facility], [place] [address] [place], [place] [address]          Passed - K in normal range and within 360 days    Potassium  Date Value Ref Range Status  09/07/2020 4.5 3.5 - 5.2 mmol/L Final          Passed - Ca in normal range and within 360 days    Calcium  Date Value Ref Range Status  09/07/2020 9.7 8.7 - 10.2 mg/dL Final   Calcium, Ion  Date Value Ref Range Status  08/20/2020 1.28 1.15 - 1.40 mmol/L Final           Passed - Na in normal range and within 360 days    Sodium  Date Value Ref Range Status  09/07/2020 142 134 - 144 mmol/L Final          Passed - Last BP in normal range    BP Readings from Last 1 Encounters:  08/30/20 132/76          Passed - Valid encounter within last 6 months    Recent Outpatient Visits           3 weeks ago Hospital discharge follow-up   [place], [place] W, NP   7 months ago Chronic deep vein thrombosis (DVT) of femoral vein of left lower extremity [facility])   [place] Elsie Stain, MD   1 year ago Recurrent acute deep vein thrombosis (DVT) of right lower extremity ([place])  [place] [name], NP   1 year ago History of pulmonary embolism   [place], Enobong, MD   3 years ago Essential hypertension   [place], Zelda W, NP       Future Appointments             In 1 week Rex Kras, [facility]   In 2 months [name], NP [place]

## 2020-10-05 ENCOUNTER — Ambulatory Visit: Payer: Medicaid Other | Admitting: Cardiology

## 2020-10-18 ENCOUNTER — Ambulatory Visit (HOSPITAL_COMMUNITY): Admission: RE | Admit: 2020-10-18 | Payer: Medicaid Other | Source: Ambulatory Visit

## 2020-10-24 ENCOUNTER — Telehealth: Payer: Self-pay | Admitting: Nurse Practitioner

## 2020-10-24 NOTE — Telephone Encounter (Signed)
I reached out to Angel Brewer today to get her scheduled with the Managed Medicaid team for a phone visit. I left my name and number on her VM for her to return my call.

## 2020-10-24 NOTE — Progress Notes (Deleted)
Date:  10/24/2020   ID:  Angel Brewer, DOB 1965-09-02, MRN [id number]  PCP:  [name], NP  Cardiologist: Rex Kras, DO, [facility] (established care 08/30/2020)  ***  No chief complaint on file.   HPI  Angel Brewer is a 55 y.o. female who presents to the office with a chief complaint of " ***." Patient's past medical history and cardiovascular risk factors include:  Hx of DVT and PE x2 (per patient), obesity, Sleep Apnea on BiPAP, former smoker.  She is referred to the office at the request of [name], NP for evaluation of shortness of breath.  Patient was recently hospitalized at [facility] between August 18, 2020-August 25, 2020 for shortness of breath.  Discharge summary reviewed.  Patient states that she was having altered mental status and difficulty breathing secondary to hypercarbic respiratory failure as she was not using her BiPAP on a regular basis.  Patient was managed and stabilized by critical care medicine and then transferred to hospitalist team and at the time of discharge it was recommended to follow-up with cardiology.  Patient denies any chest pain at rest or with effort related activities.  Patient continues to have shortness of breath but it is significantly improved since her discharge.  And she states that is back to baseline.  She states that majority of the shortness of breath is most likely secondary to deconditioning and underlying obesity.  She works long hours at [employer] and is physically active.  She does not participate in any exercise program.  Review of electronic medical records patient has been BNP on admission was 30.6 and at the time of discharge it was 13.2.  Echocardiogram noted preserved LVEF without any significant valvular heart disease.  FUNCTIONAL STATUS: Working at [employer] that keeps her busy. No structured exercise program or daily routine.   ALLERGIES: No Known Allergies  MEDICATION LIST PRIOR TO VISIT: No outpatient  medications have been marked as taking for the 10/25/20 encounter (Appointment) with Terri Skains, [place], DO.     PAST MEDICAL HISTORY: Past Medical History:  Diagnosis Date  . DVT (deep vein thrombosis) in pregnancy    RLE DVT 02/2016  . Dyspnea   . Morbid obesity ([place])   . PE (pulmonary embolism) 02/29/2016  . Sleep apnea    uses a cpap    PAST SURGICAL HISTORY: Past Surgical History:  Procedure Laterality Date  . CESAREAN SECTION     x 3  . OPEN REDUCTION INTERNAL FIXATION (ORIF) DISTAL RADIAL FRACTURE Right 06/20/2019   Procedure: OPEN REDUCTION INTERNAL FIXATION (ORIF) DISTAL RADIAL FRACTURE;  Surgeon: [name], MD;  Location: [facility];  Service: Orthopedics;  Laterality: Right;  REGIONAL BLOCK TOTAL SURGERY TIME: 90 MINUTES  . SYNOVECTOMY Right 11/21/2019   Procedure: RIGHT WRIST LIGAMENT RECONSTRUCTION;  Surgeon: [name], MD;  Location: [facility];  Service: Orthopedics;  Laterality: Right;  PROCEDURE: RIGHT WRIST LIGAMENT RECONSTRUCTION LENGTH OF SURGERY: 105 MIN    FAMILY HISTORY: The patient family history includes Hypertension in her sister.  SOCIAL HISTORY:  The patient  reports that she has quit smoking. She has never used smokeless tobacco. She reports that she does not drink alcohol and does not use drugs.  REVIEW OF SYSTEMS: Review of Systems  Constitutional: Negative for chills and fever.  HENT: Negative for hoarse voice and nosebleeds.   Eyes: Negative for discharge, double vision and pain.  Cardiovascular: Positive for dyspnea on exertion. Negative for chest pain, claudication, leg swelling, near-syncope, orthopnea, palpitations,  paroxysmal nocturnal dyspnea and syncope.  Respiratory: Positive for shortness of breath. Negative for hemoptysis.   Musculoskeletal: Negative for muscle cramps and myalgias.  Gastrointestinal: Negative for abdominal pain, constipation, diarrhea, hematemesis, hematochezia, melena, nausea and vomiting.  Neurological: Negative for  dizziness and light-headedness.    PHYSICAL EXAM: Vitals with BMI 08/30/2020 08/25/2020 08/25/2020  Height _0  - -  Weight 393 lbs - -  BMI 09.31 - -  Systolic 121 624 469  Diastolic 76 79 67  Pulse 65 68 50    CONSTITUTIONAL: Appears older than stated age, hemodynamically stable, no acute distress.  SKIN: Skin is warm and dry. No rash noted. No cyanosis. No pallor. No jaundice HEAD: Normocephalic and atraumatic.  EYES: No scleral icterus, xanthomas MOUTH/THROAT: Moist oral membranes.  NECK: Short neck stature unable to evaluate JVP.  Patient is unable to lay supine due to physical limitations.  No thyromegaly noted. No carotid bruits  LYMPHATIC: No visible cervical adenopathy.  CHEST Normal respiratory effort. No intercostal retractions  LUNGS: Decreased breath sounds at the bases.  No stridor. No wheezes. No rales.  CARDIOVASCULAR: Regular, positive S1-S2, no murmurs rubs or gallops appreciated. ABDOMINAL: Morbid obesity, positive bowel sounds in all 4 quadrants, soft, nontender, nondistended, no apparent ascites.  EXTREMITIES: Dry/scaly skin, no peripheral edema  HEMATOLOGIC: No significant bruising NEUROLOGIC: Oriented to person, place, and time. Nonfocal. Normal muscle tone.  PSYCHIATRIC: Normal mood and affect. Normal behavior. Cooperative  RADIOLOGY: CT chest PE protocol 08/18/2020: Cardiovascular: The heart is enlarged. No pericardial effusion. No visible coronary artery calcification. Some aortic atherosclerotic calcification is present. Pulmonary arteries are prominent consistent with pulmonary arterial hypertension. Pulmonary arterial opacification is moderate. No pulmonary emboli are seen. Small distal emboli might be inapparent. No large or central emboli. Impression: 1. No large or central pulmonary emboli. Pulmonary arterial opacification is only moderate. Small distal emboli might be inapparent. 2. Cardiomegaly. Enlarged pulmonary arteries consistent with pulmonary  arterial hypertension. 3. Chronic enlargement of right hilar, subcarinal and paratracheal lymph nodes. 4. Chronic mosaic attenuation pattern of the lungs as seen chronically. Seen in association with adenopathy, sarcoid could possibly give this appearance. 5. Aortic atherosclerosis.  CARDIAC DATABASE: EKG: 09/18/2020: Normal sinus rhythm, 67 bpm, normal axis, T wave inversions in leads III, aVF, V1 through V3 consider ischemia, without underlying injury pattern.  Similar findings noted on prior EKG dated 11/28/2019.  Echocardiogram: 08/19/2020: 1. Left ventricular ejection fraction, by estimation, is 60 to 65%. The left ventricle has normal function. The left ventricle has no regional wall motion abnormalities. There is mild left ventricular hypertrophy.  Left ventricular diastolic function could not be evaluated.  2. Right ventricular systolic function was not well visualized. The right ventricular size is not well visualized.  3. The mitral valve was not assessed. No evidence of mitral valve regurgitation.  4. The aortic valve was not well visualized. Aortic valve regurgitation is not visualized.   Comparison(s): Incomplete echo - patient emergently during the echo to CT for possible stroke. Echo windows are poor- consider limited follow-up echo with Definity contrast if necessary.    Stress Testing: NA  Heart Catheterization: NA  LABORATORY DATA: CBC Latest Ref Rng & Units 09/07/2020 08/25/2020 08/24/2020  WBC 3.4 - 10.8 x10E3/uL 4.8 4.1 4.4  Hemoglobin 11.1 - 15.9 g/dL 12.2 11.6(L) 11.6(L)  Hematocrit 34.0 - 46.6 % 38.7 36.0 37.5  Platelets 150 - 450 x10E3/uL 278 168 170    CMP Latest Ref Rng & Units 09/07/2020 08/25/2020 08/24/2020  Glucose  65 - 99 mg/dL 80 91 85  BUN 6 - 24 mg/dL 15 21(H) 21(H)  Creatinine 0.57 - 1.00 mg/dL 1.07(H) 0.91 0.91  Sodium 134 - 144 mmol/L 142 136 138  Potassium 3.5 - 5.2 mmol/L 4.5 3.7 4.1  Chloride 96 - 106 mmol/L 100 95(L) 97(L)  CO2 20 - 29  mmol/L 23 32 33(H)  Calcium 8.7 - 10.2 mg/dL 9.7 8.9 9.3  Total Protein 6.0 - 8.5 g/dL 7.7 7.0 6.8  Total Bilirubin 0.0 - 1.2 mg/dL 0.3 1.0 1.4(H)  Alkaline Phos 44 - 121 IU/L 61 42 41  AST 0 - 40 IU/L 13 13(L) 18  ALT 0 - 32 IU/L _0 Lipid Panel     Component Value Date/Time   CHOL 212 (H) 09/07/2020 0900   TRIG 82 09/07/2020 0900   HDL 43 09/07/2020 0900   CHOLHDL 4.9 (H) 09/07/2020 0900   LDLCALC 154 (H) 09/07/2020 0900   LABVLDL 15 09/07/2020 0900    No components found for: NTPROBNP No results for input(s): PROBNP in the last 8760 hours. No results for input(s): TSH in the last 8760 hours.  BMP Recent Labs    11/30/19 0348 02/09/20 1017 08/18/20 0246 08/24/20 0257 08/25/20 0433 09/07/20 0900  NA 141 141   < > 138 136 142  K 3.7 4.2   < > 4.1 3.7 4.5  CL 96* 106   < > 97* 95* 100  CO2 33* 25   < > 33* 32 23  GLUCOSE 93 79   < > 85 91 80  BUN 16 12   < > 21* 21* 15  CREATININE 0.95 1.01*   < > 0.91 0.91 1.07*  CALCIUM 9.0 9.0   < > 9.3 8.9 9.7  GFRNONAA >60 63   < > >60 >60 59*  GFRAA >60 73  --   --   --  68   < > = values in this interval not displayed.    HEMOGLOBIN A1C Lab Results  Component Value Date   HGBA1C 5.4 06/10/2016    IMPRESSION:  No diagnosis found.   RECOMMENDATIONS: Berkley Allers is a 55 y.o. female whose past medical history and cardiac risk factors include: Hx of DVT and PE x2 (per patient), obesity, Sleep Apnea on BiPAP, former smoker.  Patient is referred to the office for further evaluation of shortness of breath.  I believe her shortness of breath is multifactorial and not completely cardiac in origin.  Recent hospitalization noted acute respiratory failure due to hypercapnia as she was non-compliant with her BiPAP prior ER visit. She has morbid obesity which may be contributing to hypoventilatory syndrome as well.  Recent echo was suboptimal but prior echo notes grade 1 diastolic impairment. Clinically she is not volume  overload, not able to assess JVP on physical examination as she is unable to lay supine due to physical limitations, and BNP not significantly elevated. Currently on lasix.   CT scan that was done to rule out pulmonary embolism. However, it noted dilated pulmonary arteries which may be nonspecific finding given her elevated BSA.  Most recent echocardiogram was suboptimal due to technically difficult study.  However, prior echocardiograms of noted RV systolic function to be moderately reduced, RV size moderately enlarged, and elevated PASP. She may benefit from right heart catheterization to rule out pulmonary hypertension; however, the shared decision was to first rule out underlying ischemia.  We will repeat an echocardiogram.  Given her shortness of  breath, EKG findings, uncontrolled hyperlipidemia, patient would benefit from an ischemic evaluation.  She has not had one in the recent past. She is not a exercise treadmill stress test candidate due to her morbid obesity.  Prior to ordering coronary CTA versus nuclear stress test I will have to find out the weight limitations on such equipment. I will have the office reach out to [facility] and [facility] regarding this prior to ordering the test. She verbalizes understanding.  I initially had ordered a coronary calcium score for further risk stratification.  However based on the CT of the chest for PE protocol per report no significant coronary artery calcification noted.  I will discontinue the order.  Patient is also encouraged to follow-up with pulmonary medicine as a recent CT chest noted the possibility of possible sarcoid.  This may also be contributing to her underlying shortness of breath.  We will check a fasting lipid profile.  FINAL MEDICATION LIST END OF ENCOUNTER: No orders of the defined types were placed in this encounter.   There are no discontinued medications.   Current Outpatient Medications:  .  acetaminophen (TYLENOL) 325 MG tablet,  Take 2 tablets (650 mg total) by mouth every 6 (six) hours. (Patient taking differently: Take 650 mg by mouth every 6 (six) hours as needed for mild pain or headache.), Disp: , Rfl:  .  albuterol (VENTOLIN HFA) 108 (90 Base) MCG/ACT inhaler, Inhale 2 puffs into the lungs every 4 (four) hours as needed for wheezing or shortness of breath., Disp: 18 g, Rfl: 0 .  apixaban (ELIQUIS) 5 MG TABS tablet, Take 1 tablet (5 mg total) by mouth 2 (two) times daily., Disp: 180 tablet, Rfl: 1 .  atorvastatin (LIPITOR) 20 MG tablet, Take 1 tablet (20 mg total) by mouth daily., Disp: 90 tablet, Rfl: 3 .  Blood Pressure Monitor DEVI, Take blood pressure daily, Disp: 1 each, Rfl: 0 .  furosemide (LASIX) 40 MG tablet, TAKE 1 TABLET BY MOUTH EVERY DAY, Disp: 30 tablet, Rfl: 0 .  Misc. Devices MISC, Please provide insurance approved CPAP nasal mask. ICD 10 G47.33 Z99.89, Disp: 1 each, Rfl: 0 .  polyethylene glycol (MIRALAX / GLYCOLAX) 17 g packet, Take 17 g by mouth daily., Disp: 14 each, Rfl: 0 .  PRESCRIPTION MEDICATION, CPAP- At bedtime, Disp: , Rfl:  .  senna-docusate (SENOKOT-S) 8.6-50 MG tablet, Take 1 tablet by mouth at bedtime as needed for mild constipation., Disp: 30 tablet, Rfl: 0  No orders of the defined types were placed in this encounter.   There are no Patient Instructions on file for this visit.   --Continue cardiac medications as reconciled in final medication list. --No follow-ups on file. Or sooner if needed. --Continue follow-up with your primary care physician regarding the management of your other chronic comorbid conditions.  Patient's questions and concerns were addressed to her satisfaction. She voices understanding of the instructions provided during this encounter.   This note was created using a voice recognition software as a result there may be grammatical errors inadvertently enclosed that do not reflect the nature of this encounter. Every attempt is made to correct such  errors.  Rex Kras, [place], [facility]  Pager: [phone] Office: [phone]

## 2020-10-25 ENCOUNTER — Ambulatory Visit: Payer: Medicaid Other | Admitting: Cardiology

## 2020-10-25 DIAGNOSIS — R0609 Other forms of dyspnea: Secondary | ICD-10-CM

## 2020-10-25 DIAGNOSIS — G4733 Obstructive sleep apnea (adult) (pediatric): Secondary | ICD-10-CM

## 2020-10-25 DIAGNOSIS — Z86711 Personal history of pulmonary embolism: Secondary | ICD-10-CM

## 2020-10-25 DIAGNOSIS — R9431 Abnormal electrocardiogram [ECG] [EKG]: Secondary | ICD-10-CM

## 2020-10-25 DIAGNOSIS — Z86718 Personal history of other venous thrombosis and embolism: Secondary | ICD-10-CM

## 2020-10-25 DIAGNOSIS — E7800 Pure hypercholesterolemia, unspecified: Secondary | ICD-10-CM

## 2020-11-06 ENCOUNTER — Telehealth: Payer: Self-pay | Admitting: Nurse Practitioner

## 2020-11-06 NOTE — Telephone Encounter (Signed)
Today was my 2nd attempt to reach Angel Brewer to get her scheduled for a phone visit with the MM team. I left my name and number for her to call me back.

## 2020-11-26 ENCOUNTER — Ambulatory Visit: Payer: Medicaid Other | Admitting: Nurse Practitioner

## 2020-12-13 ENCOUNTER — Telehealth: Payer: Self-pay | Admitting: Nurse Practitioner

## 2020-12-13 NOTE — Telephone Encounter (Signed)
..  Patient declines further follow up and engagement by the Managed Medicaid Team. Appropriate care team members and provider have been notified via electronic communication. The Managed Medicaid Team is available to follow up with the patient after provider conversation with the patient regarding recommendation for engagement and subsequent re-referral to the Managed Medicaid Team.   [place], [place]

## 2020-12-27 ENCOUNTER — Inpatient Hospital Stay (HOSPITAL_COMMUNITY)
Admission: EM | Admit: 2020-12-27 | Discharge: 2020-12-30 | DRG: 176 | Disposition: A | Discharge status: Home / Self Care | Payer: Medicaid Other | Attending: Internal Medicine | Admitting: Internal Medicine

## 2020-12-27 ENCOUNTER — Inpatient Hospital Stay (HOSPITAL_COMMUNITY): Payer: Medicaid Other

## 2020-12-27 ENCOUNTER — Other Ambulatory Visit (HOSPITAL_COMMUNITY): Payer: Medicaid Other

## 2020-12-27 ENCOUNTER — Other Ambulatory Visit: Payer: Self-pay

## 2020-12-27 ENCOUNTER — Emergency Department (HOSPITAL_COMMUNITY): Payer: Medicaid Other

## 2020-12-27 ENCOUNTER — Encounter (HOSPITAL_COMMUNITY): Payer: Self-pay | Admitting: Emergency Medicine

## 2020-12-27 DIAGNOSIS — I272 Pulmonary hypertension, unspecified: Secondary | ICD-10-CM | POA: Diagnosis present

## 2020-12-27 DIAGNOSIS — G4733 Obstructive sleep apnea (adult) (pediatric): Secondary | ICD-10-CM | POA: Diagnosis not present

## 2020-12-27 DIAGNOSIS — Z9119 Patient's noncompliance with other medical treatment and regimen: Secondary | ICD-10-CM | POA: Diagnosis not present

## 2020-12-27 DIAGNOSIS — Z20822 Contact with and (suspected) exposure to covid-19: Secondary | ICD-10-CM | POA: Diagnosis not present

## 2020-12-27 DIAGNOSIS — Z86718 Personal history of other venous thrombosis and embolism: Secondary | ICD-10-CM | POA: Diagnosis not present

## 2020-12-27 DIAGNOSIS — Z87891 Personal history of nicotine dependence: Secondary | ICD-10-CM

## 2020-12-27 DIAGNOSIS — I2693 Single subsegmental pulmonary embolism without acute cor pulmonale: Secondary | ICD-10-CM | POA: Diagnosis not present

## 2020-12-27 DIAGNOSIS — Z6841 Body Mass Index (BMI) 40.0 and over, adult: Secondary | ICD-10-CM | POA: Diagnosis not present

## 2020-12-27 DIAGNOSIS — E785 Hyperlipidemia, unspecified: Secondary | ICD-10-CM | POA: Diagnosis present

## 2020-12-27 DIAGNOSIS — I5032 Chronic diastolic (congestive) heart failure: Secondary | ICD-10-CM | POA: Diagnosis present

## 2020-12-27 DIAGNOSIS — I5033 Acute on chronic diastolic (congestive) heart failure: Secondary | ICD-10-CM | POA: Diagnosis not present

## 2020-12-27 DIAGNOSIS — E559 Vitamin D deficiency, unspecified: Secondary | ICD-10-CM | POA: Diagnosis not present

## 2020-12-27 DIAGNOSIS — R0602 Shortness of breath: Secondary | ICD-10-CM | POA: Diagnosis not present

## 2020-12-27 DIAGNOSIS — Z79899 Other long term (current) drug therapy: Secondary | ICD-10-CM | POA: Diagnosis not present

## 2020-12-27 DIAGNOSIS — I110 Hypertensive heart disease with heart failure: Secondary | ICD-10-CM | POA: Diagnosis present

## 2020-12-27 DIAGNOSIS — I2699 Other pulmonary embolism without acute cor pulmonale: Secondary | ICD-10-CM | POA: Diagnosis present

## 2020-12-27 DIAGNOSIS — Z86711 Personal history of pulmonary embolism: Secondary | ICD-10-CM | POA: Diagnosis not present

## 2020-12-27 DIAGNOSIS — M7989 Other specified soft tissue disorders: Secondary | ICD-10-CM

## 2020-12-27 DIAGNOSIS — Z7982 Long term (current) use of aspirin: Secondary | ICD-10-CM

## 2020-12-27 DIAGNOSIS — J9611 Chronic respiratory failure with hypoxia: Secondary | ICD-10-CM | POA: Diagnosis not present

## 2020-12-27 DIAGNOSIS — I517 Cardiomegaly: Secondary | ICD-10-CM | POA: Diagnosis not present

## 2020-12-27 LAB — BASIC METABOLIC PANEL
Anion gap: 7 (ref 5–15)
BUN: 14 mg/dL (ref 6–20)
CO2: 28 mmol/L (ref 22–32)
Calcium: 8.9 mg/dL (ref 8.9–10.3)
Chloride: 105 mmol/L (ref 98–111)
Creatinine, Ser: 0.86 mg/dL (ref 0.44–1.00)
GFR, Estimated: >60 mL/min (ref >60)
Glucose, Bld: 83 mg/dL (ref 70–99)
Potassium: 4.2 mmol/L (ref 3.5–5.1)
Sodium: 140 mmol/L (ref 135–145)

## 2020-12-27 LAB — RESP PANEL BY RT-PCR (FLU A&B, COVID) ARPGX2
Influenza A by PCR: NEGATIVE
Influenza B by PCR: NEGATIVE
SARS Coronavirus 2 by RT PCR: NEGATIVE

## 2020-12-27 LAB — CBC
HCT: 37.2 % (ref 36.0–46.0)
Hemoglobin: 11.4 g/dL — ABNORMAL LOW (ref 12.0–15.0)
MCH: 29.2 pg (ref 26.0–34.0)
MCHC: 30.6 g/dL (ref 30.0–36.0)
MCV: 95.1 fL (ref 80.0–100.0)
Platelets: 185 10*3/uL (ref 150–400)
RBC: 3.91 MIL/uL (ref 3.87–5.11)
RDW: 13.9 % (ref 11.5–15.5)
WBC: 3.8 10*3/uL — ABNORMAL LOW (ref 4.0–10.5)
nRBC: 0 % (ref 0.0–0.2)

## 2020-12-27 LAB — HEPARIN LEVEL (UNFRACTIONATED): Heparin Unfractionated: 0.24 IU/mL — ABNORMAL LOW (ref 0.30–0.70)

## 2020-12-27 LAB — I-STAT BETA HCG BLOOD, ED (MC, WL, AP ONLY): I-stat hCG, quantitative: <5 m[IU]/mL (ref <5)

## 2020-12-27 MED ORDER — SENNOSIDES-DOCUSATE SODIUM 8.6-50 MG PO TABS: 1.0000 | ORAL_TABLET | Freq: Every evening | ORAL | Status: DC | PRN

## 2020-12-27 MED ORDER — ACETAMINOPHEN ER 650 MG PO TBCR: 1300.0000 mg | EXTENDED_RELEASE_TABLET | ORAL | Status: DC | PRN

## 2020-12-27 MED ORDER — ALBUTEROL SULFATE (2.5 MG/3ML) 0.083% IN NEBU: 3.0000 mL | INHALATION_SOLUTION | RESPIRATORY_TRACT | Status: DC | PRN

## 2020-12-27 MED ORDER — HEPARIN BOLUS VIA INFUSION
6000.0000 [IU] | Freq: Once | INTRAVENOUS | Status: AC
  Administered 2020-12-27: 6000 [IU] via INTRAVENOUS
  Filled 2020-12-27: qty 6000

## 2020-12-27 MED ORDER — IOHEXOL 350 MG/ML SOLN
100.0000 mL | Freq: Once | INTRAVENOUS | Status: AC
  Administered 2020-12-27: 86 mL via INTRAVENOUS

## 2020-12-27 MED ORDER — IPRATROPIUM-ALBUTEROL 0.5-2.5 (3) MG/3ML IN SOLN
3.0000 mL | Freq: Four times a day (QID) | RESPIRATORY_TRACT | Status: DC
  Administered 2020-12-27 (×2): 3 mL via RESPIRATORY_TRACT
  Filled 2020-12-27 (×2): qty 3

## 2020-12-27 MED ORDER — HEPARIN (PORCINE) 25000 UT/250ML-% IV SOLN
2000.0000 [IU]/h | INTRAVENOUS | Status: DC
  Administered 2020-12-27 (×2): 1700 [IU]/h via INTRAVENOUS
  Administered 2020-12-28 – 2020-12-29 (×2): 2000 [IU]/h via INTRAVENOUS
  Filled 2020-12-27 (×5): qty 250

## 2020-12-27 MED ORDER — POLYETHYLENE GLYCOL 3350 17 G PO PACK
17.0000 g | PACK | Freq: Every day | ORAL | Status: DC
  Administered 2020-12-27: 17 g via ORAL
  Filled 2020-12-27 (×2): qty 1

## 2020-12-27 MED ORDER — ATORVASTATIN CALCIUM 10 MG PO TABS
20.0000 mg | ORAL_TABLET | Freq: Every day | ORAL | Status: DC
  Administered 2020-12-27 – 2020-12-30 (×4): 20 mg via ORAL
  Filled 2020-12-27 (×4): qty 2

## 2020-12-27 MED ORDER — IPRATROPIUM-ALBUTEROL 0.5-2.5 (3) MG/3ML IN SOLN
3.0000 mL | Freq: Two times a day (BID) | RESPIRATORY_TRACT | Status: DC
  Administered 2020-12-28 – 2020-12-30 (×5): 3 mL via RESPIRATORY_TRACT
  Filled 2020-12-27 (×5): qty 3

## 2020-12-27 MED ORDER — HEPARIN (PORCINE) 25000 UT/250ML-% IV SOLN: 10.0000 [IU]/kg/h | INTRAVENOUS | Status: DC

## 2020-12-27 MED ORDER — ASPIRIN EC 81 MG PO TBEC
81.0000 mg | DELAYED_RELEASE_TABLET | Freq: Every day | ORAL | Status: DC
  Administered 2020-12-27 – 2020-12-30 (×4): 81 mg via ORAL
  Filled 2020-12-27 (×4): qty 1

## 2020-12-27 MED ORDER — AMLODIPINE BESYLATE 5 MG PO TABS
5.0000 mg | ORAL_TABLET | Freq: Every day | ORAL | Status: DC
  Administered 2020-12-27 – 2020-12-30 (×4): 5 mg via ORAL
  Filled 2020-12-27 (×4): qty 1

## 2020-12-27 MED ORDER — ASPIRIN 81 MG PO TBEC: 81.0000 mg | DELAYED_RELEASE_TABLET | Freq: Every day | ORAL | Status: DC

## 2020-12-27 MED ORDER — FUROSEMIDE 40 MG PO TABS
40.0000 mg | ORAL_TABLET | Freq: Every day | ORAL | Status: DC
  Administered 2020-12-27 – 2020-12-30 (×4): 40 mg via ORAL
  Filled 2020-12-27: qty 2
  Filled 2020-12-27 (×3): qty 1

## 2020-12-27 NOTE — ED Notes (Signed)
Attempted to call report

## 2020-12-27 NOTE — Plan of Care (Signed)
  Problem: Education: Goal: Knowledge of General Education information will improve Description: Including pain rating scale, medication(s)/side effects and non-pharmacologic comfort measures Outcome: Progressing

## 2020-12-27 NOTE — ED Triage Notes (Signed)
Pt states she has felt SOB since last night. Pt states she wears a CPAP at night, but hasn't worn it in 3 weeks due to the clips being broken. Pt states she also feels swollen in her legs. Denies CP.

## 2020-12-27 NOTE — Progress Notes (Signed)
Bilateral lower extremity venous duplex has been completed. Preliminary results can be found in [facility] Proc through chart review.   12/27/20 1:33 PM Angel Brewer RVT

## 2020-12-27 NOTE — ED Provider Notes (Signed)
[facility] Provider Note   CSN: [id number] Arrival date & time: 12/27/20  0865     History Chief Complaint  Patient presents with   Shortness of Breath    Angel Brewer is a 55 y.o. female.  HPI  Patient presents with shortness of breath x3 days.  Feels like she is unable to breathe, or that she stops breathing in the middle of the night and wakes up.  It is worse when she lays down flat, it is waking her up from sleep.  States she is no longer able to sleep sitting upright. States she usually sleeps with a CPAP machine, but she broke it a couple weeks ago.  She is also noted swelling in her legs bilaterally, started on Lasix.  She has since run out of that.  She is not having any chest pain, lightheadedness, cough.  History of previous DVTs, was on Eliquis but was discontinued that she months ago.  No chest pain, no unilateral leg swelling.  Past Medical History:  Diagnosis Date   DVT (deep vein thrombosis) in pregnancy    RLE DVT 02/2016   Dyspnea    Morbid obesity ([place])    PE (pulmonary embolism) 02/29/2016   Sleep apnea    uses a cpap    Patient Active Problem List   Diagnosis Date Noted   Respiratory failure ([place]) 08/19/2020   Pulmonary hypertension ([place]) 08/18/2020   OSA (obstructive sleep apnea) 08/18/2020   Chronic deep vein thrombosis (DVT) of femoral vein of left lower extremity ([place]) 02/09/2020   Chronic anticoagulation 02/09/2020   Elevated blood pressure reading 02/09/2020   Right wrist fracture 06/14/2019   Chronic respiratory failure with hypoxia ([place]) 06/13/2019   Essential hypertension 08/25/2017   OSA on CPAP 02/03/2017   Morbid obesity ([place]) 10/29/2016   Vitamin D deficiency 10/29/2016    Past Surgical History:  Procedure Laterality Date   CESAREAN SECTION     x 3   OPEN REDUCTION INTERNAL FIXATION (ORIF) DISTAL RADIAL FRACTURE Right 06/20/2019   Procedure: OPEN REDUCTION INTERNAL FIXATION (ORIF) DISTAL  RADIAL FRACTURE;  Surgeon: [name], MD;  Location: [facility];  Service: Orthopedics;  Laterality: Right;  REGIONAL BLOCK TOTAL SURGERY TIME: 90 MINUTES   SYNOVECTOMY Right 11/21/2019   Procedure: RIGHT WRIST LIGAMENT RECONSTRUCTION;  Surgeon: [name], MD;  Location: [facility];  Service: Orthopedics;  Laterality: Right;  PROCEDURE: RIGHT WRIST LIGAMENT RECONSTRUCTION LENGTH OF SURGERY: 105 MIN     OB History   No obstetric history on file.     Family History  Problem Relation Age of Onset   Hypertension Sister     Social History   Tobacco Use   Smoking status: Former    Pack years: 0.00   Smokeless tobacco: Never   Tobacco comments:    smoked only as a teenager  Media planner   Vaping Use: Never used  Substance Use Topics   Alcohol use: No   Drug use: No    Home Medications Prior to Admission medications   Medication Sig Start Date End Date Taking? Authorizing Provider  acetaminophen (TYLENOL) 325 MG tablet Take 2 tablets (650 mg total) by mouth every 6 (six) hours. Patient taking differently: Take 650 mg by mouth every 6 (six) hours as needed for mild pain or headache. 11/21/19   [name], MD  albuterol (VENTOLIN HFA) 108 (90 Base) MCG/ACT inhaler Inhale 2 puffs into the lungs every 4 (four) hours as needed for wheezing  or shortness of breath. 08/26/20   Raiford Noble Latif, DO  apixaban (ELIQUIS) 5 MG TABS tablet Take 1 tablet (5 mg total) by mouth 2 (two) times daily. 09/03/20 12/02/20  [name], NP  atorvastatin (LIPITOR) 20 MG tablet Take 1 tablet (20 mg total) by mouth daily. 09/12/20   [name], NP  Blood Pressure Monitor DEVI Take blood pressure daily 02/09/20   Elsie Stain, MD  furosemide (LASIX) 40 MG tablet TAKE 1 TABLET BY MOUTH EVERY DAY 09/24/20   [name], NP  Misc. Devices MISC Please provide insurance approved CPAP nasal mask. ICD 10 G47.33 Z99.89 09/02/20   [name], NP  polyethylene glycol (MIRALAX / GLYCOLAX) 17 g  packet Take 17 g by mouth daily. 08/26/20   Raiford Noble Latif, DO  PRESCRIPTION MEDICATION CPAP- At bedtime    [provider]  senna-docusate (SENOKOT-S) 8.6-50 MG tablet Take 1 tablet by mouth at bedtime as needed for mild constipation. 08/25/20   Kerney Elbe, DO    Allergies    Patient has no known allergies.  Review of Systems   Review of Systems  Constitutional:  Negative for activity change, chills, fatigue and fever.  HENT:  Negative for ear pain and sore throat.   Eyes:  Negative for pain and visual disturbance.  Respiratory:  Positive for apnea and shortness of breath. Negative for cough.   Cardiovascular:  Positive for leg swelling. Negative for chest pain and palpitations.  Gastrointestinal:  Negative for abdominal pain, nausea and vomiting.  Genitourinary:  Negative for dysuria and hematuria.  Musculoskeletal:  Negative for arthralgias and back pain.  Skin:  Negative for color change and rash.  Neurological:  Negative for seizures and syncope.  All other systems reviewed and are negative.  Physical Exam Updated Vital Signs BP (!) 150/95   Pulse 68   Temp 98.4 F (36.9 C) (Oral)   Resp 18   Ht _0  (1.727 m)   Wt (!) 188.2 kg   LMP 11/29/2015   SpO2 90%   BMI 63.10 kg/m   Physical Exam Vitals and nursing note reviewed. Exam conducted with a chaperone present.  Constitutional:      Appearance: Normal appearance. She is well-developed. She is obese.  HENT:     Head: Normocephalic and atraumatic.  Eyes:     General: No scleral icterus.       Right eye: No discharge.        Left eye: No discharge.     Extraocular Movements: Extraocular movements intact.     Pupils: Pupils are equal, round, and reactive to light.  Cardiovascular:     Rate and Rhythm: Regular rhythm. Bradycardia present.     Pulses: Normal pulses.     Heart sounds: Normal heart sounds. No murmur heard.   No friction rub. No gallop.  Pulmonary:     Effort: Pulmonary effort  is normal. No tachypnea, accessory muscle usage or respiratory distress.     Breath sounds: Normal breath sounds. No decreased breath sounds.     Comments: Lung sounds are decreased secondary to body habitus.  Exam limited due to body habitus, unable to auscultate any wheezes on exam. Abdominal:     General: Abdomen is flat. Bowel sounds are normal. There is no distension.     Palpations: Abdomen is soft.     Tenderness: There is no abdominal tenderness.  Skin:    General: Skin is warm and dry.  Coloration: Skin is not jaundiced.  Neurological:     Mental Status: She is alert. Mental status is at baseline.     Coordination: Coordination normal.    ED Results / Procedures / Treatments   Labs (all labs ordered are listed, but only abnormal results are displayed) Labs Reviewed  BASIC METABOLIC PANEL  CBC  I-STAT BETA HCG BLOOD, ED ([facility], [facility], [facility] ONLY)    EKG None  Radiology DG Chest 2 View  Result Date: 12/27/2020 CLINICAL DATA:  55 year old female with shortness of breath progressing this week. EXAM: CHEST - 2 VIEW COMPARISON:  Portable chest 08/25/2020 and earlier. FINDINGS: Stable cardiomegaly and mediastinal contours. Chronically somewhat low lung volumes. Visualized tracheal air column is within normal limits. No pneumothorax, pulmonary edema, pleural effusion or confluent pulmonary opacity. No acute osseous abnormality identified. Negative visible bowel gas pattern. IMPRESSION: Stable cardiomegaly and low lung volumes. No acute cardiopulmonary abnormality. Electronically Signed   By: Genevie Ann M.D.   On: 12/27/2020 07:50    Procedures Procedures   Medications Ordered in ED Medications - No data to display  ED Course  I have reviewed the triage vital signs and the nursing notes.  Pertinent labs & imaging results that were available during my care of the patient were reviewed by me and considered in my medical decision making (see chart for details).  Clinical Course as of  12/27/20 1229  Thu Dec 27, 2020  4401 DG Chest 2 View Stable cardiomegaly. No PNA, PTX, or signs of pleural effusion.  [HS]  0846 EKG 12-Lead NSR, not in arrhythmia  [HS]  0846 Hemoglobin(!): 11.4 Mildly anemic, but appears to be here baseline.  [HS]  0847 I-Stat beta hCG blood, ED Not ectopic pregnancy  [HS]    Clinical Course User Index [HS] Sherrill Raring, PA-C   MDM Rules/Calculators/A&P                         Patient is a 55 year old female presenting with shortness of breath.  She has a history of sleep apnea, dyspnea, morbid obesity, DVTs and PE is no longer anticoagulated.  While in the room patient dropped to 87% oxygen on room air.  She was started on 2 L nasal cannula.  Oxygen returned to 100%.  Ddx includes: PE, PNA, PTX, OSA, obesity hyperventilation syndrome,anemia, heart failure   Lab interpretation is above in ED course.   Patient was previously on Eliquis for PE and DVT.  She has stopped it 2 months ago, which makes me concerned that the shortness of breath may be related to a PE.  CTA is ordered to evaluate for PE.  CTA is revealing for PEs.  Started the patient on a heparin drip and put an order for consult hospitalist.  COVID test ordered and pending.  Spoke with Dr. Roosevelt Locks and he is agreeable to admit patient.  Final Clinical Impression(s) / ED Diagnoses Final diagnoses:  None    Rx / DC Orders ED Discharge Orders     None        Sherrill Raring, Hershal [place]ia 12/27/20 1230    Carmin Muskrat, MD 12/27/20 1624

## 2020-12-27 NOTE — Progress Notes (Signed)
ANTICOAGULATION CONSULT NOTE - Initial Consult  Pharmacy Consult for heparin Indication: pulmonary embolus  No Known Allergies  Patient Measurements: Height: _0  (172.7 cm) Weight: (!) 188.2 kg (415 lb) IBW/kg (Calculated) : 63.9 Heparin Dosing Weight: 112kg  Vital Signs: Temp: 98.4 F (36.9 C) (06/16 0709) Temp Source: Oral (06/16 0709) BP: 161/80 (06/16 1100) Pulse Rate: 55 (06/16 1100)  Labs: Recent Labs    12/27/20 0714  HGB 11.4*  HCT 37.2  PLT 185  CREATININE 0.86    Estimated Creatinine Clearance: 132.6 mL/min (by C-G formula based on SCr of 0.86 mg/dL).   Medical History: Past Medical History:  Diagnosis Date   DVT (deep vein thrombosis) in pregnancy    RLE DVT 02/2016   Dyspnea    Morbid obesity (H[facility])    PE (pulmonary embolism) 02/29/2016   Sleep apnea    uses a cpap    Medications:  Infusions:   heparin      Assessment: 52 yof with a history of VTE presented to the ED with SOB. Found to have  PE and now starting heparin. She was on apixaban in the past but that has been discontinued. Hgb is slightly low but platelets are WNL. No bleeding noted.   Goal of Therapy:  Heparin level 0.3-0.7 units/ml Monitor platelets by anticoagulation protocol: Yes   Plan:  Heparin bolus 6000 units IV x 1 Heparin gtt 1700 units/hr (previously therapeutic on this dose) Check a 6 hr heparin level Daily heparin level and CBC  Jonas Goh, Rande Lawman 12/27/2020,12:05 PM

## 2020-12-27 NOTE — H&P (Signed)
History and Physical    Storie Wessner QPR:[id number] DOB: 1966-02-21 DOA: 12/27/2020  PCP: [name], NP (Confirm with patient/family/NH records and if not entered, this has to be entered at [facility] point of entry) Patient coming from: Home  I have personally briefly reviewed patient's old medical records in [place]  Chief Complaint: SOB  HPI: Angel Brewer is a 55 y.o. female with medical history significant of DVT and PE, morbid obesity, OSA noncompliant with CPAP, presented with increasing shortness of breath.  Patient has been on Eliquis for DVT and PE for few years, and recently, her PCP decided to discontinue Eliquis " I was told that I do not need Eliquis anymore".  Started from 3 to 4 weeks ago, patient started to feel shortness of breath, equatorial at the beginning but last 2 to 3 days became more constant.  Denies any chest pain, no cough no fever chills.  Patient also reported that CPAP machine was broken 3 weeks ago.  Patient PPCP also start patient on Lasix recently for increasing leg swelling.  No leg pain, no recent long distance travel.  ED Course: CTA positive for PE) lateral segment right lower lobe.  Positive for cardiomegaly which is stable.  No findings indicating right heart strain.  CT also suspect pulmonary hypertension.  Review of Systems: As per HPI otherwise 14 point review of systems negative.    Past Medical History:  Diagnosis Date   DVT (deep vein thrombosis) in pregnancy    RLE DVT 02/2016   Dyspnea    Morbid obesity (HCC)    PE (pulmonary embolism) 02/29/2016   Sleep apnea    uses a cpap    Past Surgical History:  Procedure Laterality Date   CESAREAN SECTION     x 3   OPEN REDUCTION INTERNAL FIXATION (ORIF) DISTAL RADIAL FRACTURE Right 06/20/2019   Procedure: OPEN REDUCTION INTERNAL FIXATION (ORIF) DISTAL RADIAL FRACTURE;  Surgeon: [name], MD;  Location: [facility];  Service: Orthopedics;  Laterality: Right;  REGIONAL BLOCK TOTAL  SURGERY TIME: 90 MINUTES   SYNOVECTOMY Right 11/21/2019   Procedure: RIGHT WRIST LIGAMENT RECONSTRUCTION;  Surgeon: [name], MD;  Location: [facility];  Service: Orthopedics;  Laterality: Right;  PROCEDURE: RIGHT WRIST LIGAMENT RECONSTRUCTION LENGTH OF SURGERY: 105 MIN     reports that she has quit smoking. She has never used smokeless tobacco. She reports that she does not drink alcohol and does not use drugs.  No Known Allergies  Family History  Problem Relation Age of Onset   Hypertension Sister      Prior to Admission medications   Medication Sig Start Date End Date Taking? Authorizing Provider  acetaminophen (TYLENOL) 650 MG CR tablet Take 1,300 mg by mouth as needed for pain.   Yes [provider]  albuterol (VENTOLIN HFA) 108 (90 Base) MCG/ACT inhaler Inhale 2 puffs into the lungs every 4 (four) hours as needed for wheezing or shortness of breath. 08/26/20  Yes Sheikh, Omair Latif, DO  ASPIRIN LOW DOSE 81 MG EC tablet Take 81 mg by mouth 5 (five) times daily. SWALLOW WHOLE. 09/03/20  Yes [provider]  atorvastatin (LIPITOR) 20 MG tablet Take 1 tablet (20 mg total) by mouth daily. 09/12/20  Yes [name], NP  Blood Pressure Monitor DEVI Take blood pressure daily 02/09/20  Yes Elsie Stain, MD  furosemide (LASIX) 40 MG tablet TAKE 1 TABLET BY MOUTH EVERY DAY 09/24/20  Yes [name], NP  Misc. Devices  MISC Please provide insurance approved CPAP nasal mask. ICD 10 G47.33 Z99.89 09/02/20  Yes [name], NP  polyethylene glycol (MIRALAX / GLYCOLAX) 17 g packet Take 17 g by mouth daily. Patient taking differently: Take 17 g by mouth as needed (constipation). 08/26/20  Yes Sheikh, Omair Latif, DO  PRESCRIPTION MEDICATION CPAP- At bedtime   Yes [provider]  senna-docusate (SENOKOT-S) 8.6-50 MG tablet Take 1 tablet by mouth at bedtime as needed for mild constipation. 08/25/20  Yes Sheikh, Omair Latif, DO  acetaminophen (TYLENOL) 325 MG  tablet Take 2 tablets (650 mg total) by mouth every 6 (six) hours. Patient not taking: Reported on 12/27/2020 11/21/19   [name], MD  apixaban (ELIQUIS) 5 MG TABS tablet Take 1 tablet (5 mg total) by mouth 2 (two) times daily. Patient not taking: Reported on 12/27/2020 09/03/20 12/27/20  [name], NP    Physical Exam: Vitals:   12/27/20 0930 12/27/20 1000 12/27/20 1030 12/27/20 1100  BP: (!) 159/94 (!) 153/83 (!) 169/93 (!) 161/80  Pulse: (!) 55 (!) 56 64 (!) 55  Resp: _0 Temp:      TempSrc:      SpO2: 99% 96% 95% 94%  Weight:      Height:        Constitutional: NAD, calm, comfortable Vitals:   12/27/20 0930 12/27/20 1000 12/27/20 1030 12/27/20 1100  BP: (!) 159/94 (!) 153/83 (!) 169/93 (!) 161/80  Pulse: (!) 55 (!) 56 64 (!) 55  Resp: _1 Temp:      TempSrc:      SpO2: 99% 96% 95% 94%  Weight:      Height:       Eyes: PERRL, lids and conjunctivae normal ENMT: Mucous membranes are moist. Posterior pharynx clear of any exudate or lesions.Normal dentition.  Neck: normal, supple, no masses, no thyromegaly Respiratory: clear to auscultation bilaterally, no wheezing, no crackles. Normal respiratory effort. No accessory muscle use.  Cardiovascular: Regular rate and rhythm, no murmurs / rubs / gallops. trace extremity edema. 2+ pedal pulses. No carotid bruits.  Abdomen: no tenderness, no masses palpated. No hepatosplenomegaly. Bowel sounds positive.  Musculoskeletal: no clubbing / cyanosis. No joint deformity upper and lower extremities. Good ROM, no contractures. Normal muscle tone.  Skin: no rashes, lesions, ulcers. No induration Neurologic: CN 2-12 grossly intact. Sensation intact, DTR normal. Strength 5/5 in all 4.  Psychiatric: Normal judgment and insight. Alert and oriented x 3. Normal mood.     Labs on Admission: I have personally reviewed following labs and imaging studies  CBC: Recent Labs  Lab 12/27/20 0714  WBC 3.8*  HGB 11.4*  HCT  37.2  MCV 95.1  PLT 182   Basic Metabolic Panel: Recent Labs  Lab 12/27/20 0714  NA 140  K 4.2  CL 105  CO2 28  GLUCOSE 83  BUN 14  CREATININE 0.86  CALCIUM 8.9   GFR: Estimated Creatinine Clearance: 132.6 mL/min (by C-G formula based on SCr of 0.86 mg/dL). Liver Function Tests: No results for input(s): AST, ALT, ALKPHOS, BILITOT, PROT, ALBUMIN in the last 168 hours. No results for input(s): LIPASE, AMYLASE in the last 168 hours. No results for input(s): AMMONIA in the last 168 hours. Coagulation Profile: No results for input(s): INR, PROTIME in the last 168 hours. Cardiac Enzymes: No results for input(s): CKTOTAL, CKMB, CKMBINDEX, TROPONINI in the last 168 hours. BNP (last 3 results) No results for input(s): PROBNP in the  last 8760 hours. HbA1C: No results for input(s): HGBA1C in the last 72 hours. CBG: No results for input(s): GLUCAP in the last 168 hours. Lipid Profile: No results for input(s): CHOL, HDL, LDLCALC, TRIG, CHOLHDL, LDLDIRECT in the last 72 hours. Thyroid Function Tests: No results for input(s): TSH, T4TOTAL, FREET4, T3FREE, THYROIDAB in the last 72 hours. Anemia Panel: No results for input(s): VITAMINB12, FOLATE, FERRITIN, TIBC, IRON, RETICCTPCT in the last 72 hours. Urine analysis:    Component Value Date/Time   COLORURINE AMBER (A) 11/23/2019 0220   APPEARANCEUR CLOUDY (A) 11/23/2019 0220   LABSPEC 1.027 11/23/2019 0220   PHURINE 5.0 11/23/2019 0220   GLUCOSEU NEGATIVE 11/23/2019 0220   HGBUR NEGATIVE 11/23/2019 0220   BILIRUBINUR NEGATIVE 11/23/2019 0220   KETONESUR NEGATIVE 11/23/2019 0220   PROTEINUR 100 (A) 11/23/2019 0220   NITRITE NEGATIVE 11/23/2019 0220   LEUKOCYTESUR NEGATIVE 11/23/2019 0220    Radiological Exams on Admission: DG Chest 2 View  Result Date: 12/27/2020 CLINICAL DATA:  55 year old female with shortness of breath progressing this week. EXAM: CHEST - 2 VIEW COMPARISON:  Portable chest 08/25/2020 and earlier. FINDINGS:  Stable cardiomegaly and mediastinal contours. Chronically somewhat low lung volumes. Visualized tracheal air column is within normal limits. No pneumothorax, pulmonary edema, pleural effusion or confluent pulmonary opacity. No acute osseous abnormality identified. Negative visible bowel gas pattern. IMPRESSION: Stable cardiomegaly and low lung volumes. No acute cardiopulmonary abnormality. Electronically Signed   By: Genevie Ann M.D.   On: 12/27/2020 07:50   CT Angio Chest PE W/Cm &/Or Wo Cm  Result Date: 12/27/2020 CLINICAL DATA:  Shortness of breath and lower extremity edema EXAM: CT ANGIOGRAPHY CHEST WITH CONTRAST TECHNIQUE: Multidetector CT imaging of the chest was performed using the standard protocol during bolus administration of intravenous contrast. Multiplanar CT image reconstructions and MIPs were obtained to evaluate the vascular anatomy. CONTRAST:  19m OMNIPAQUE IOHEXOL 350 MG/ML SOLN COMPARISON:  Chest radiograph December 27, 2020; CT angiogram chest August 18, 2020 FINDINGS: Cardiovascular: There are pulmonary emboli in peripheral anterior and lateral lateral right lower lobe arterial branches. No more central pulmonary emboli are evident. There is chronic cardiomegaly which is stable. There is not felt to be right heart strain secondary to localize segmental right lower lobe pulmonary emboli in the absence of larger pulmonary emboli elsewhere. Visualized great vessels appear normal. No appreciable thoracic aortic aneurysm or dissection. No pericardial effusion or pericardial thickening. Main pulmonary outflow tract measures 4.0 cm, consistent with pulmonary arterial hypertension. Mediastinum/Nodes: Thyroid unremarkable. There is a stable lymph node in the right hilar region measuring 1.2 x 1.0 cm. Stable subcarinal lymph node measuring 1.3 x 1.1 cm. Right paratracheal lymph node measures 1.3 x 1.2 cm, smaller than on previous study. No new foci of lymph node enlargement. No esophageal lesions.  Lungs/Pleura: Lungs again demonstrate a somewhat mosaic pattern of attenuation which is stable. No consolidation or edema. No pleural effusions. No tracheal or major bronchial lesions evident. No pneumothorax. Upper Abdomen: Visualized upper abdominal structures appear unremarkable. Musculoskeletal: There is degenerative change in the thoracic spine with diffuse idiopathic skeletal hyperostosis. No blastic or lytic bone lesions. No chest wall lesions. Review of the MIP images confirms the above findings. IMPRESSION: 1. Pulmonary emboli in anterior and lateral segmental right lower lobe pulmonary artery branches. No more central pulmonary embolus evident. There is cardiomegaly, stable,. Stable prominence of the right heart without findings felt to be indicative of right heart strain secondary to the segmental right lower lobe pulmonary emboli.  2. Enlargement of the main pulmonary outflow tract indicative of pulmonary arterial hypertension. 3. Mosaic pattern of attenuation in the lungs, stable, likely representing underlying small airways obstructive disease. No edema or consolidation. 4. Mild chronic adenopathy, with lymph node in the right paratracheal region smaller than on previous study. No new adenopathy. 5.  Diffuse idiopathic skeletal hyperostosis in the thoracic spine. Critical Value/emergent results were called by telephone at the time of interpretation on 12/27/2020 at 11:48 am to provider Carmin Muskrat , who verbally acknowledged these results. Electronically Signed   By: Lowella Grip III M.D.   On: 12/27/2020 11:48    EKG: Independently reviewed. Sinus, no ST-T changes.  Assessment/Plan Active Problems:   Pulmonary emboli (HCC)  (please populate well all problems here in Problem List. (For example, if patient is on BP meds at home and you resume or decide to hold them, it is a problem that needs to be her. Same for CAD, COPD, HLD and so on)   PE recurrent -Unprovoked and recurrent,  indications for lifelong anticoagulation. -On heparin drip, plans to continue heparin drip for now and wean off oxygen and switch to p.o. Eliquis and discharged home likely in 1 to 2 days. -DVT study, echocardiogram  Impending hypoxic respite failure -Secondary to PE, treatment as above.  Peripheral edema -Suspect underlying DVT given acute onset -Continue Lasix for now.  OSA noncompliant with CPAP -Restart CPAP.  Morbid obesity -Recommend outpatient bariatric evaluation  HLD -Continue statin  DVT prophylaxis: Heparin drip Code Status: Full code Family Communication: None at bedside Disposition Plan: Expect 1 to 2 days hospital stay to wean down oxygen and complete PE/DVT study. Consults called: None Admission status: MedSurg admit   Lequita Halt MD [facility] Pager [phone]  12/27/2020, 12:59 PM

## 2020-12-27 NOTE — Progress Notes (Signed)
ANTICOAGULATION CONSULT NOTE - Follow Up Consult  Pharmacy Consult for IV Heparin Indication: pulmonary embolus  No Known Allergies  Patient Measurements: Height: _0  (172.7 cm) Weight: (!) 188.2 kg (415 lb) IBW/kg (Calculated) : 63.9 Heparin Dosing Weight: 112 kg  Vital Signs: Temp: 97.9 F (36.6 C) (06/16 1754) Temp Source: Oral (06/16 1420) BP: 181/97 (06/16 1754) Pulse Rate: 74 (06/16 2020)  Labs: Recent Labs    12/27/20 0714 12/27/20 1912  HGB 11.4*  --   HCT 37.2  --   PLT 185  --   HEPARINUNFRC  --  0.24*  CREATININE 0.86  --     Estimated Creatinine Clearance: 132.6 mL/min (by C-G formula based on SCr of 0.86 mg/dL).   Medications:  Infusions:   heparin 1,700 Units/hr (12/27/20 1759)    Assessment: 55 years of age female on IV heparin for pulmonary embolus.   Initial heparin level is 0.24 - subtherapeutic for goal after 6000 unit bolus and rate of 1700 units/hr.  CBC is stable. No issues with infusion or bleeding.   Goal of Therapy:  Heparin level 0.3-0.7 units/ml Monitor platelets by anticoagulation protocol: Yes   Plan:  Increase Heparin to 2000 units/hr.  Recheck Heparin level in ~6-8 hours.  Daily Heparin level and CBC while on therapy.   Sloan Leiter, PharmD, BCPS, BCCCP Clinical Pharmacist Please refer to [facility] for [place] numbers 12/27/2020,8:47 PM

## 2020-12-28 ENCOUNTER — Inpatient Hospital Stay (HOSPITAL_COMMUNITY): Payer: Medicaid Other

## 2020-12-28 DIAGNOSIS — I2699 Other pulmonary embolism without acute cor pulmonale: Secondary | ICD-10-CM | POA: Diagnosis not present

## 2020-12-28 DIAGNOSIS — I2693 Single subsegmental pulmonary embolism without acute cor pulmonale: Secondary | ICD-10-CM | POA: Diagnosis not present

## 2020-12-28 LAB — CBC
HCT: 36.8 % (ref 36.0–46.0)
Hemoglobin: 11 g/dL — ABNORMAL LOW (ref 12.0–15.0)
MCH: 28.4 pg (ref 26.0–34.0)
MCHC: 29.9 g/dL — ABNORMAL LOW (ref 30.0–36.0)
MCV: 94.8 fL (ref 80.0–100.0)
Platelets: 167 10*3/uL (ref 150–400)
RBC: 3.88 MIL/uL (ref 3.87–5.11)
RDW: 14.1 % (ref 11.5–15.5)
WBC: 4.6 10*3/uL (ref 4.0–10.5)
nRBC: 0 % (ref 0.0–0.2)

## 2020-12-28 LAB — ECHOCARDIOGRAM COMPLETE
AR max vel: 3.74 cm2
AV Area VTI: 3.75 cm2
AV Area mean vel: 3.56 cm2
AV Mean grad: 4 mmHg
AV Peak grad: 7.7 mmHg
Ao pk vel: 1.39 m/s
Area-P 1/2: 2.66 cm2
Height: 68 in
S' Lateral: 3.3 cm
Weight: 6640 oz

## 2020-12-28 LAB — HEPARIN LEVEL (UNFRACTIONATED)
Heparin Unfractionated: 0.44 IU/mL (ref 0.30–0.70)
Heparin Unfractionated: 0.51 IU/mL (ref 0.30–0.70)

## 2020-12-28 MED ORDER — PERFLUTREN LIPID MICROSPHERE
1.0000 mL | INTRAVENOUS | Status: AC | PRN
  Administered 2020-12-28: 2 mL via INTRAVENOUS
  Filled 2020-12-28: qty 10

## 2020-12-28 NOTE — Progress Notes (Signed)
PROGRESS NOTE    Angel Brewer  SLP:[id number] DOB: [birth year] DOA: 12/27/2020 PCP: [name], NP    Brief Narrative:   55 y.o. female with medical history significant of DVT and PE, morbid obesity, OSA noncompliant with CPAP, presented with increasing shortness of breath. Pt found to have RLL PE  Assessment & Plan:   Active Problems:   Pulmonary emboli (HCC)  PE recurrent -Unprovoked and recurrent, indications for lifelong anticoagulation. -Has remained on heparin drip x 24hrs -2d echo reviewed, difficult to assess RV function on that study -Since pt remains O2 dependent, anticipate resuming eliquis on 6/18 -LE dopplers reviewed, neg for DVT   Impending hypoxic respite failure -Secondary to PE -Currently remains on 3LNC. Wean O2 as tolerated -Pt is O2 naive   Peripheral edema -Suspect underlying DVT given acute onset -Continue Lasix for now.   OSA with cpap -Pt reports defective cpap at home, currently in the process of being repaired  -cont with cpap whle in hospital   Morbid obesity -Recommend outpatient bariatric evaluation -recommend diet/lifestyle modification   HLD -Continue statin    DVT prophylaxis: Heparin gtt Code Status: Full Family Communication: Pt in room, family not at bedside  Status is: Inpatient  Remains inpatient appropriate because:Inpatient level of care appropriate due to severity of illness  Dispo: The patient is from: Home              Anticipated d/c is to: Home              Patient currently is not medically stable to d/c.   Difficult to place patient No       Consultants:    Procedures:    Antimicrobials: Anti-infectives (From admission, onward)    None       Subjective: Reports feeling somewhat better  Objective: Vitals:   12/27/20 2020 12/28/20 0335 12/28/20 0803 12/28/20 1446  BP: (!) 172/92 137/72  132/81  Pulse: 74 (!) 52 (!) 57 (!) 59  Resp: _0 Temp: 97.6 F (36.4 C) 97.7 F (36.5  C)  98.5 F (36.9 C)  TempSrc: Oral   Oral  SpO2: 92% 98% 93% 100%  Weight:      Height:        Intake/Output Summary (Last 24 hours) at 12/28/2020 1638 Last data filed at 12/28/2020 1545 Gross per 24 hour  Intake 755.02 ml  Output 400 ml  Net 355.02 ml   Filed Weights   12/27/20 0712  Weight: (!) 188.2 kg    Examination: General exam: Awake, laying in bed, in nad Respiratory system: Normal respiratory effort, no wheezing Cardiovascular system: regular rate, s1, s2 Gastrointestinal system: Soft, nondistended, positive BS Central nervous system: CN2-12 grossly intact, strength intact Extremities: Perfused, no clubbing Skin: Normal skin turgor, no notable skin lesions seen Psychiatry: Mood normal // no visual hallucinations   Data Reviewed: I have personally reviewed following labs and imaging studies  CBC: Recent Labs  Lab 12/27/20 0714 12/28/20 0227  WBC 3.8* 4.6  HGB 11.4* 11.0*  HCT 37.2 36.8  MCV 95.1 94.8  PLT 185 111   Basic Metabolic Panel: Recent Labs  Lab 12/27/20 0714  NA 140  K 4.2  CL 105  CO2 28  GLUCOSE 83  BUN 14  CREATININE 0.86  CALCIUM 8.9   GFR: Estimated Creatinine Clearance: 132.6 mL/min (by C-G formula based on SCr of 0.86 mg/dL). Liver Function Tests: No results for input(s): AST, ALT, ALKPHOS, BILITOT, PROT, ALBUMIN in  the last 168 hours. No results for input(s): LIPASE, AMYLASE in the last 168 hours. No results for input(s): AMMONIA in the last 168 hours. Coagulation Profile: No results for input(s): INR, PROTIME in the last 168 hours. Cardiac Enzymes: No results for input(s): CKTOTAL, CKMB, CKMBINDEX, TROPONINI in the last 168 hours. BNP (last 3 results) No results for input(s): PROBNP in the last 8760 hours. HbA1C: No results for input(s): HGBA1C in the last 72 hours. CBG: No results for input(s): GLUCAP in the last 168 hours. Lipid Profile: No results for input(s): CHOL, HDL, LDLCALC, TRIG, CHOLHDL, LDLDIRECT in the  last 72 hours. Thyroid Function Tests: No results for input(s): TSH, T4TOTAL, FREET4, T3FREE, THYROIDAB in the last 72 hours. Anemia Panel: No results for input(s): VITAMINB12, FOLATE, FERRITIN, TIBC, IRON, RETICCTPCT in the last 72 hours. Sepsis Labs: No results for input(s): PROCALCITON, LATICACIDVEN in the last 168 hours.  Recent Results (from the past 240 hour(s))  Resp Panel by RT-PCR (Flu A&B, Covid) Nasopharyngeal Swab     Status: None   Collection Time: 12/27/20 11:55 AM   Specimen: Nasopharyngeal Swab; Nasopharyngeal(NP) swabs in vial transport medium  Result Value Ref Range Status   SARS Coronavirus 2 by RT PCR NEGATIVE NEGATIVE Final    Comment: (NOTE) SARS-CoV-2 target nucleic acids are NOT DETECTED.  The SARS-CoV-2 RNA is generally detectable in upper respiratory specimens during the acute phase of infection. The lowest concentration of SARS-CoV-2 viral copies this assay can detect is 138 copies/mL. A negative result does not preclude SARS-Cov-2 infection and should not be used as the sole basis for treatment or other patient management decisions. A negative result may occur with  improper specimen collection/handling, submission of specimen other than nasopharyngeal swab, presence of viral mutation(s) within the areas targeted by this assay, and inadequate number of viral copies(<138 copies/mL). A negative result must be combined with clinical observations, patient history, and epidemiological information. The expected result is Negative.  Fact Sheet for Patients:  EntrepreneurPulse.com.au  Fact Sheet for Healthcare Providers:  IncredibleEmployment.be  This test is no t yet approved or cleared by the [place] FDA and  has been authorized for detection and/or diagnosis of SARS-CoV-2 by FDA under an Emergency Use Authorization (EUA). This EUA will remain  in effect (meaning this test can be used) for the duration of  the COVID-19 declaration under Section 564(b)(1) of the Act, 21 U.S.C.section 360bbb-3(b)(1), unless the authorization is terminated  or revoked sooner.       Influenza A by PCR NEGATIVE NEGATIVE Final   Influenza B by PCR NEGATIVE NEGATIVE Final    Comment: (NOTE) The Xpert Xpress SARS-CoV-2/FLU/RSV plus assay is intended as an aid in the diagnosis of influenza from Nasopharyngeal swab specimens and should not be used as a sole basis for treatment. Nasal washings and aspirates are unacceptable for Xpert Xpress SARS-CoV-2/FLU/RSV testing.  Fact Sheet for Patients: EntrepreneurPulse.com.au  Fact Sheet for Healthcare Providers: IncredibleEmployment.be  This test is not yet approved or cleared by the [place] FDA and has been authorized for detection and/or diagnosis of SARS-CoV-2 by FDA under an Emergency Use Authorization (EUA). This EUA will remain in effect (meaning this test can be used) for the duration of the COVID-19 declaration under Section 564(b)(1) of the Act, 21 U.S.C. section 360bbb-3(b)(1), unless the authorization is terminated or revoked.  Performed at [facility], [place] [address], [place], [place] [address]      Radiology Studies: DG Chest 2 View  Result Date:  12/27/2020 CLINICAL DATA:  55 year old female with shortness of breath progressing this week. EXAM: CHEST - 2 VIEW COMPARISON:  Portable chest 08/25/2020 and earlier. FINDINGS: Stable cardiomegaly and mediastinal contours. Chronically somewhat low lung volumes. Visualized tracheal air column is within normal limits. No pneumothorax, pulmonary edema, pleural effusion or confluent pulmonary opacity. No acute osseous abnormality identified. Negative visible bowel gas pattern. IMPRESSION: Stable cardiomegaly and low lung volumes. No acute cardiopulmonary abnormality. Electronically Signed   By: Genevie Ann M.D.   On: 12/27/2020 07:50   CT Angio Chest PE W/Cm &/Or Wo  Cm  Result Date: 12/27/2020 CLINICAL DATA:  Shortness of breath and lower extremity edema EXAM: CT ANGIOGRAPHY CHEST WITH CONTRAST TECHNIQUE: Multidetector CT imaging of the chest was performed using the standard protocol during bolus administration of intravenous contrast. Multiplanar CT image reconstructions and MIPs were obtained to evaluate the vascular anatomy. CONTRAST:  71m OMNIPAQUE IOHEXOL 350 MG/ML SOLN COMPARISON:  Chest radiograph December 27, 2020; CT angiogram chest August 18, 2020 FINDINGS: Cardiovascular: There are pulmonary emboli in peripheral anterior and lateral lateral right lower lobe arterial branches. No more central pulmonary emboli are evident. There is chronic cardiomegaly which is stable. There is not felt to be right heart strain secondary to localize segmental right lower lobe pulmonary emboli in the absence of larger pulmonary emboli elsewhere. Visualized great vessels appear normal. No appreciable thoracic aortic aneurysm or dissection. No pericardial effusion or pericardial thickening. Main pulmonary outflow tract measures 4.0 cm, consistent with pulmonary arterial hypertension. Mediastinum/Nodes: Thyroid unremarkable. There is a stable lymph node in the right hilar region measuring 1.2 x 1.0 cm. Stable subcarinal lymph node measuring 1.3 x 1.1 cm. Right paratracheal lymph node measures 1.3 x 1.2 cm, smaller than on previous study. No new foci of lymph node enlargement. No esophageal lesions. Lungs/Pleura: Lungs again demonstrate a somewhat mosaic pattern of attenuation which is stable. No consolidation or edema. No pleural effusions. No tracheal or major bronchial lesions evident. No pneumothorax. Upper Abdomen: Visualized upper abdominal structures appear unremarkable. Musculoskeletal: There is degenerative change in the thoracic spine with diffuse idiopathic skeletal hyperostosis. No blastic or lytic bone lesions. No chest wall lesions. Review of the MIP images confirms the above  findings. IMPRESSION: 1. Pulmonary emboli in anterior and lateral segmental right lower lobe pulmonary artery branches. No more central pulmonary embolus evident. There is cardiomegaly, stable,. Stable prominence of the right heart without findings felt to be indicative of right heart strain secondary to the segmental right lower lobe pulmonary emboli. 2. Enlargement of the main pulmonary outflow tract indicative of pulmonary arterial hypertension. 3. Mosaic pattern of attenuation in the lungs, stable, likely representing underlying small airways obstructive disease. No edema or consolidation. 4. Mild chronic adenopathy, with lymph node in the right paratracheal region smaller than on previous study. No new adenopathy. 5.  Diffuse idiopathic skeletal hyperostosis in the thoracic spine. Critical Value/emergent results were called by telephone at the time of interpretation on 12/27/2020 at 11:48 am to provider RCarmin Muskrat, who verbally acknowledged these results. Electronically Signed   By: WLowella GripIII M.D.   On: 12/27/2020 11:48   ECHOCARDIOGRAM COMPLETE  Result Date: 12/28/2020    ECHOCARDIOGRAM REPORT   Patient Name:   Angel Brewer Date of Exam: 12/28/2020 Medical Rec #:  [id number]     Height:       68.0 in Accession #:    [id number]    Weight:       415.0  lb Date of Birth:  [birth year]      BSA:          2.787 m Patient Age:    35 years       BP:           137/72 mmHg Patient Gender: F              HR:           57 bpm. Exam Location:  Inpatient Procedure: 2D Echo, Cardiac Doppler, Color Doppler and Intracardiac            Opacification Agent Indications:    Pulmonary embolus  History:        Patient has prior history of Echocardiogram examinations, most                 recent 08/19/2020. Risk Factors:Sleep Apnea. Hx DVT.  Sonographer:    Clayton Lefort RDCS (AE) Referring Phys: [id number] Lequita Halt  Sonographer Comments: Technically difficult study due to poor echo windows, suboptimal parasternal  window, suboptimal apical window, suboptimal subcostal window and patient is morbidly obese. Image acquisition challenging due to patient body habitus. IMPRESSIONS  1. Left ventricular ejection fraction, by estimation, is 60 to 65%. The left ventricle has normal function. The left ventricle has no regional wall motion abnormalities. Left ventricular diastolic parameters were normal.  2. Right ventricular systolic function was not well visualized. The right ventricular size is not well visualized.  3. The mitral valve is normal in structure. Trivial mitral valve regurgitation.  4. The aortic valve is normal in structure. Aortic valve regurgitation is not visualized. No aortic stenosis is present. FINDINGS  Left Ventricle: Left ventricular ejection fraction, by estimation, is 60 to 65%. The left ventricle has normal function. The left ventricle has no regional wall motion abnormalities. Definity contrast agent was given IV to delineate the left ventricular  endocardial borders. The left ventricular internal cavity size was normal in size. There is borderline left ventricular hypertrophy. Left ventricular diastolic parameters were normal. Right Ventricle: The right ventricular size is not well visualized. Right vetricular wall thickness was not well visualized. Right ventricular systolic function was not well visualized. Left Atrium: Left atrial size was not well visualized. Right Atrium: Right atrial size was not well visualized. Pericardium: The pericardium was not well visualized. Mitral Valve: The mitral valve is normal in structure. Trivial mitral valve regurgitation. Tricuspid Valve: The tricuspid valve is not well visualized. Tricuspid valve regurgitation is trivial. Aortic Valve: The aortic valve is normal in structure. Aortic valve regurgitation is not visualized. No aortic stenosis is present. Aortic valve mean gradient measures 4.0 mmHg. Aortic valve peak gradient measures 7.7 mmHg. Aortic valve area, by VTI  measures 3.75 cm. Pulmonic Valve: The pulmonic valve was not well visualized. Pulmonic valve regurgitation is not visualized. Aorta: The aortic root and ascending aorta are structurally normal, with no evidence of dilitation. IAS/Shunts: The interatrial septum was not well visualized.  LEFT VENTRICLE PLAX 2D LVIDd:         5.30 cm  Diastology LVIDs:         3.30 cm  LV e' medial:    9.46 cm/s LV PW:         1.20 cm  LV E/e' medial:  8.7 LV IVS:        1.10 cm  LV e' lateral:   10.60 cm/s LVOT diam:     2.40 cm  LV E/e' lateral: 7.7 LV SV:  109 LV SV Index:   39 LVOT Area:     4.52 cm  IVC IVC diam: 2.80 cm AORTIC VALVE AV Area (Vmax):    3.74 cm AV Area (Vmean):   3.56 cm AV Area (VTI):     3.75 cm AV Vmax:           139.00 cm/s AV Vmean:          87.800 cm/s AV VTI:            0.292 m AV Peak Grad:      7.7 mmHg AV Mean Grad:      4.0 mmHg LVOT Vmax:         115.00 cm/s LVOT Vmean:        69.100 cm/s LVOT VTI:          0.242 m LVOT/AV VTI ratio: 0.83  AORTA Ao Root diam: 3.30 cm MITRAL VALVE MV Area (PHT): 2.66 cm    SHUNTS MV Decel Time: 285 msec    Systemic VTI:  0.24 m MV E velocity: 81.90 cm/s  Systemic Diam: 2.40 cm MV A velocity: 82.50 cm/s MV E/A ratio:  0.99 Mertie Moores MD Electronically signed by Mertie Moores MD Signature Date/Time: 12/28/2020/12:16:59 PM    Final    VAS [place] LOWER EXTREMITY VENOUS (DVT)  Result Date: 12/27/2020  Lower Venous DVT Study Patient Name:  Angel Brewer  Date of Exam:   12/27/2020 Medical Rec #: [id number]       Accession #:    [id number] Date of Birth: 1966-07-11       Patient Gender: F Patient Age:   52Y Exam Location:  [facility] Procedure:      VAS [place] LOWER EXTREMITY VENOUS (DVT) Referring Phys: [id number] Lequita Halt --------------------------------------------------------------------------------  Indications: Swelling.  Risk Factors: None identified. Limitations: Body habitus and poor ultrasound/tissue interface. Comparison Study: No prior studies.  Performing Technologist: Oliver Hum RVT  Examination Guidelines: A complete evaluation includes B-mode imaging, spectral Doppler, color Doppler, and power Doppler as needed of all accessible portions of each vessel. Bilateral testing is considered an integral part of a complete examination. Limited examinations for reoccurring indications may be performed as noted. The reflux portion of the exam is performed with the patient in reverse Trendelenburg.  +---------+---------------+---------+-----------+----------+--------------+ RIGHT    CompressibilityPhasicitySpontaneityPropertiesThrombus Aging +---------+---------------+---------+-----------+----------+--------------+ CFV      Full           Yes      Yes                                 +---------+---------------+---------+-----------+----------+--------------+ SFJ      Full                                                        +---------+---------------+---------+-----------+----------+--------------+ FV Prox  Full                                                        +---------+---------------+---------+-----------+----------+--------------+ FV Mid  Yes      Yes                                 +---------+---------------+---------+-----------+----------+--------------+ FV Distal               Yes      Yes                                 +---------+---------------+---------+-----------+----------+--------------+ PFV      Full                                                        +---------+---------------+---------+-----------+----------+--------------+ POP      Full           Yes      Yes                                 +---------+---------------+---------+-----------+----------+--------------+ PTV      Full                                                        +---------+---------------+---------+-----------+----------+--------------+ PERO     Full                                                         +---------+---------------+---------+-----------+----------+--------------+   +---------+---------------+---------+-----------+----------+-------------------+ LEFT     CompressibilityPhasicitySpontaneityPropertiesThrombus Aging      +---------+---------------+---------+-----------+----------+-------------------+ CFV      Full           Yes      Yes                                      +---------+---------------+---------+-----------+----------+-------------------+ SFJ      Full                                                             +---------+---------------+---------+-----------+----------+-------------------+ FV Prox  Full                                                             +---------+---------------+---------+-----------+----------+-------------------+ FV Mid  Not well visualized +---------+---------------+---------+-----------+----------+-------------------+ FV Distal                                             Not well visualized +---------+---------------+---------+-----------+----------+-------------------+ PFV      Full                                                             +---------+---------------+---------+-----------+----------+-------------------+ POP      Full           Yes      Yes                                      +---------+---------------+---------+-----------+----------+-------------------+ PTV      Full                                                             +---------+---------------+---------+-----------+----------+-------------------+ PERO     Full                                                             +---------+---------------+---------+-----------+----------+-------------------+     Summary: RIGHT: - There is no evidence of deep vein thrombosis in the lower extremity. However, portions of this examination were  limited- see technologist comments above.  - No cystic structure found in the popliteal fossa.  LEFT: - There is no evidence of deep vein thrombosis in the lower extremity. However, portions of this examination were limited- see technologist comments above.  - No cystic structure found in the popliteal fossa.  *See table(s) above for measurements and observations. Electronically signed by Jamelle Haring on 12/27/2020 at 2:18:23 PM.    Final     Scheduled Meds:  amLODipine  5 mg Oral Daily   aspirin EC  81 mg Oral Daily   atorvastatin  20 mg Oral Daily   furosemide  40 mg Oral Daily   ipratropium-albuterol  3 mL Nebulization BID   polyethylene glycol  17 g Oral Daily   Continuous Infusions:  heparin 2,000 Units/hr (12/28/20 0400)     LOS: 1 day   Marylu Lund, MD [facility] Pager On Amion  If 7PM-7AM, please contact night-coverage 12/28/2020, 4:38 PM

## 2020-12-28 NOTE — Progress Notes (Signed)
ANTICOAGULATION CONSULT NOTE - Follow Up Consult  Pharmacy Consult for IV Heparin Indication: pulmonary embolus  No Known Allergies  Patient Measurements: Height: 5' 8" (172.7 cm) Weight: (!) 188.2 kg (415 lb) IBW/kg (Calculated) : 63.9 Heparin Dosing Weight: 112 kg  Vital Signs: Temp: 97.7 F (36.5 C) (06/17 0335) BP: 137/72 (06/17 0335) Pulse Rate: 57 (06/17 0803)  Labs: Recent Labs    12/27/20 0714 12/27/20 1912 12/28/20 0227 12/28/20 0958  HGB 11.4*  --  11.0*  --   HCT 37.2  --  36.8  --   PLT 185  --  167  --   HEPARINUNFRC  --  0.24* 0.44 0.51  CREATININE 0.86  --   --   --      Estimated Creatinine Clearance: 132.6 mL/min (by C-G formula based on SCr of 0.86 mg/dL).   Medications:  Infusions:   heparin 2,000 Units/hr (12/28/20 0400)    Assessment: 55 years of age female on IV heparin for pulmonary embolus.   Heparin level is now therapeutic x2. Dr Wyline Copas will assess whether to go back on apixan vs lovenox once seen today.    Goal of Therapy:  Heparin level 0.3-0.7 units/ml Monitor platelets by anticoagulation protocol: Yes   Plan:  Cont heparin  2000 units/hr.  Daily Heparin level and CBC while on therapy F/u with oral [facility]   Onnie Boer, PharmD, BCIDP, AAHIVP, CPP Infectious Disease Pharmacist 12/28/2020 11:00 AM

## 2020-12-28 NOTE — Progress Notes (Signed)
  Echocardiogram 2D Echocardiogram has been performed.  Angel Brewer 12/28/2020, 9:57 AM

## 2020-12-28 NOTE — Plan of Care (Signed)

## 2020-12-28 NOTE — Progress Notes (Signed)
This patient is independent and can bathe herself. Everything was provided for the patient to do so.

## 2020-12-29 DIAGNOSIS — I2699 Other pulmonary embolism without acute cor pulmonale: Secondary | ICD-10-CM | POA: Diagnosis not present

## 2020-12-29 LAB — CBC
HCT: 39.4 % (ref 36.0–46.0)
Hemoglobin: 12.1 g/dL (ref 12.0–15.0)
MCH: 29.4 pg (ref 26.0–34.0)
MCHC: 30.7 g/dL (ref 30.0–36.0)
MCV: 95.6 fL (ref 80.0–100.0)
Platelets: 191 10*3/uL (ref 150–400)
RBC: 4.12 MIL/uL (ref 3.87–5.11)
RDW: 13.8 % (ref 11.5–15.5)
WBC: 3.5 10*3/uL — ABNORMAL LOW (ref 4.0–10.5)
nRBC: 0 % (ref 0.0–0.2)

## 2020-12-29 LAB — HEPARIN LEVEL (UNFRACTIONATED): Heparin Unfractionated: 0.53 IU/mL (ref 0.30–0.70)

## 2020-12-29 MED ORDER — AYR SALINE NASAL NA GEL
1.0000 "application " | NASAL | Status: DC | PRN
  Filled 2020-12-29: qty 14.1

## 2020-12-29 MED ORDER — APIXABAN 5 MG PO TABS: 5.0000 mg | ORAL_TABLET | Freq: Two times a day (BID) | ORAL | Status: DC

## 2020-12-29 MED ORDER — APIXABAN 5 MG PO TABS
10.0000 mg | ORAL_TABLET | Freq: Two times a day (BID) | ORAL | Status: DC
  Administered 2020-12-29 – 2020-12-30 (×3): 10 mg via ORAL
  Filled 2020-12-29 (×4): qty 2

## 2020-12-29 NOTE — Progress Notes (Signed)
ANTICOAGULATION CONSULT NOTE - Follow Up Consult  Pharmacy Consult for IV Heparin>>apixaban Indication: pulmonary embolus  No Known Allergies  Patient Measurements: Height: _0  (172.7 cm) Weight: (!) 188.2 kg (415 lb) IBW/kg (Calculated) : 63.9 Heparin Dosing Weight: 112 kg  Vital Signs: Temp: 97.6 F (36.4 C) (06/17 2129) Temp Source: Oral (06/17 2129) BP: 157/104 (06/18 0524) Pulse Rate: 59 (06/18 0524)  Labs: Recent Labs    12/27/20 0714 12/27/20 1912 12/28/20 0227 12/28/20 0958 12/29/20 0500  HGB 11.4*  --  11.0*  --  12.1  HCT 37.2  --  36.8  --  39.4  PLT 185  --  167  --  191  HEPARINUNFRC  --    < > 0.44 0.51 0.53  CREATININE 0.86  --   --   --   --    < > = values in this interval not displayed.     Estimated Creatinine Clearance: 132.6 mL/min (by C-G formula based on SCr of 0.86 mg/dL).   Medications:  Infusions:   heparin 2,000 Units/hr (12/29/20 0818)    Assessment: 55 years of age female on IV heparin for recurrent pulmonary embolus. HL has been therapeutic x3. Consult by Dr. Wyline Copas to transition to apixaban. CBC stable this AM. Of note, patient was on apixaban 5 MG BID PTA, but records indicate patient was not taking.    Goal of Therapy:  Heparin level 0.3-0.7 units/ml Monitor platelets by anticoagulation protocol: Yes   Plan:  Apixaban 10 MG BID x7 days followed by apixaban 5 MG BID  Monitor CBC while inpatient  Cephus Slater, PharmD, [facility] Resident [phone] 12/29/2020 8:51 AM

## 2020-12-29 NOTE — Progress Notes (Signed)
PROGRESS NOTE    Angel Brewer  KMM:[id number] DOB: Feb 11, 1966 DOA: 12/27/2020 PCP: [name], NP    Brief Narrative:   55 y.o. female with medical history significant of DVT and PE, morbid obesity, OSA noncompliant with CPAP, presented with increasing shortness of breath. Pt found to have RLL PE  Assessment & Plan:   Active Problems:   Pulmonary emboli (HCC)  PE recurrent -Unprovoked and recurrent, indications for lifelong anticoagulation. -Has remained on heparin drip x 24hrs -2d echo reviewed, difficult to assess RV function on that study -LE dopplers reviewed, neg for DVT -Patient remains O2 dependent on 3LNC. Pt is O2 naive. Wean O2 as tolerated -Will transition to eliquis today   Impending hypoxic respite failure -Secondary to PE -Currently remains on 3LNC. Wean O2 as tolerated -Pt is O2 naive   Peripheral edema -Suspect underlying DVT given acute onset -Continued on Lasix for now.   OSA with cpap -Pt reports defective cpap at home, currently in the process of being repaired  -cont with cpap whle in hospital   Morbid obesity -Recommend outpatient bariatric evaluation -would recommend diet/lifestyle modification   HLD -Continue statin    DVT prophylaxis: Heparin gtt -> eliquis Code Status: Full Family Communication: Pt in room, family not at bedside  Status is: Inpatient  Remains inpatient appropriate because:Inpatient level of care appropriate due to severity of illness  Dispo: The patient is from: Home              Anticipated d/c is to: Home              Patient currently is not medically stable to d/c.   Difficult to place patient No       Consultants:    Procedures:    Antimicrobials: Anti-infectives (From admission, onward)    None       Subjective: Without complaints  Objective: Vitals:   12/29/20 0524 12/29/20 0702 12/29/20 0823 12/29/20 1353  BP: (!) 157/104   (!) 114/54  Pulse: (!) 59   (!) 58  Resp:    16   Temp:    99 F (37.2 C)  TempSrc:      SpO2: 100% 100% 94% 93%  Weight:      Height:        Intake/Output Summary (Last 24 hours) at 12/29/2020 1735 Last data filed at 12/29/2020 1300 Gross per 24 hour  Intake 480 ml  Output --  Net 480 ml    Filed Weights   12/27/20 7903  Weight: (!) 188.2 kg    Examination: General exam: Conversant, in no acute distress Respiratory system: normal chest rise, clear, no audible wheezing Cardiovascular system: regular rhythm, s1-s2 Gastrointestinal system: Nondistended, nontender, pos BS Central nervous system: No seizures, no tremors Extremities: No cyanosis, no joint deformities Skin: No rashes, no pallor Psychiatry: Affect normal // no auditory hallucinations   Data Reviewed: I have personally reviewed following labs and imaging studies  CBC: Recent Labs  Lab 12/27/20 0714 12/28/20 0227 12/29/20 0500  WBC 3.8* 4.6 3.5*  HGB 11.4* 11.0* 12.1  HCT 37.2 36.8 39.4  MCV 95.1 94.8 95.6  PLT 185 167 833    Basic Metabolic Panel: Recent Labs  Lab 12/27/20 0714  NA 140  K 4.2  CL 105  CO2 28  GLUCOSE 83  BUN 14  CREATININE 0.86  CALCIUM 8.9    GFR: Estimated Creatinine Clearance: 132.6 mL/min (by C-G formula based on SCr of 0.86 mg/dL). Liver Function Tests:  No results for input(s): AST, ALT, ALKPHOS, BILITOT, PROT, ALBUMIN in the last 168 hours. No results for input(s): LIPASE, AMYLASE in the last 168 hours. No results for input(s): AMMONIA in the last 168 hours. Coagulation Profile: No results for input(s): INR, PROTIME in the last 168 hours. Cardiac Enzymes: No results for input(s): CKTOTAL, CKMB, CKMBINDEX, TROPONINI in the last 168 hours. BNP (last 3 results) No results for input(s): PROBNP in the last 8760 hours. HbA1C: No results for input(s): HGBA1C in the last 72 hours. CBG: No results for input(s): GLUCAP in the last 168 hours. Lipid Profile: No results for input(s): CHOL, HDL, LDLCALC, TRIG, CHOLHDL,  LDLDIRECT in the last 72 hours. Thyroid Function Tests: No results for input(s): TSH, T4TOTAL, FREET4, T3FREE, THYROIDAB in the last 72 hours. Anemia Panel: No results for input(s): VITAMINB12, FOLATE, FERRITIN, TIBC, IRON, RETICCTPCT in the last 72 hours. Sepsis Labs: No results for input(s): PROCALCITON, LATICACIDVEN in the last 168 hours.  Recent Results (from the past 240 hour(s))  Resp Panel by RT-PCR (Flu A&B, Covid) Nasopharyngeal Swab     Status: None   Collection Time: 12/27/20 11:55 AM   Specimen: Nasopharyngeal Swab; Nasopharyngeal(NP) swabs in vial transport medium  Result Value Ref Range Status   SARS Coronavirus 2 by RT PCR NEGATIVE NEGATIVE Final    Comment: (NOTE) SARS-CoV-2 target nucleic acids are NOT DETECTED.  The SARS-CoV-2 RNA is generally detectable in upper respiratory specimens during the acute phase of infection. The lowest concentration of SARS-CoV-2 viral copies this assay can detect is 138 copies/mL. A negative result does not preclude SARS-Cov-2 infection and should not be used as the sole basis for treatment or other patient management decisions. A negative result may occur with  improper specimen collection/handling, submission of specimen other than nasopharyngeal swab, presence of viral mutation(s) within the areas targeted by this assay, and inadequate number of viral copies(<138 copies/mL). A negative result must be combined with clinical observations, patient history, and epidemiological information. The expected result is Negative.  Fact Sheet for Patients:  EntrepreneurPulse.com.au  Fact Sheet for Healthcare Providers:  IncredibleEmployment.be  This test is no t yet approved or cleared by the [place] FDA and  has been authorized for detection and/or diagnosis of SARS-CoV-2 by FDA under an Emergency Use Authorization (EUA). This EUA will remain  in effect (meaning this test can be used) for the  duration of the COVID-19 declaration under Section 564(b)(1) of the Act, 21 U.S.C.section 360bbb-3(b)(1), unless the authorization is terminated  or revoked sooner.       Influenza A by PCR NEGATIVE NEGATIVE Final   Influenza B by PCR NEGATIVE NEGATIVE Final    Comment: (NOTE) The Xpert Xpress SARS-CoV-2/FLU/RSV plus assay is intended as an aid in the diagnosis of influenza from Nasopharyngeal swab specimens and should not be used as a sole basis for treatment. Nasal washings and aspirates are unacceptable for Xpert Xpress SARS-CoV-2/FLU/RSV testing.  Fact Sheet for Patients: EntrepreneurPulse.com.au  Fact Sheet for Healthcare Providers: IncredibleEmployment.be  This test is not yet approved or cleared by the [place] FDA and has been authorized for detection and/or diagnosis of SARS-CoV-2 by FDA under an Emergency Use Authorization (EUA). This EUA will remain in effect (meaning this test can be used) for the duration of the COVID-19 declaration under Section 564(b)(1) of the Act, 21 U.S.C. section 360bbb-3(b)(1), unless the authorization is terminated or revoked.  Performed at [facility], [place] [address] [place], [place] [address]  Radiology Studies: ECHOCARDIOGRAM COMPLETE  Result Date: 12/28/2020    ECHOCARDIOGRAM REPORT   Patient Name:   Brinn Dwan Date of Exam: 12/28/2020 Medical Rec #:  [id number]      Height:       68.0 in Accession #:    [id number]     Weight:       415.0 lb Date of Birth:  February 17, 1966      BSA:          2.787 m Patient Age:    30 years       BP:           137/72 mmHg Patient Gender: F              HR:           57 bpm. Exam Location:  Inpatient Procedure: 2D Echo, Cardiac Doppler, Color Doppler and Intracardiac            Opacification Agent Indications:    Pulmonary embolus  History:        Patient has prior history of Echocardiogram examinations, most                 recent 08/19/2020. Risk  Factors:Sleep Apnea. Hx DVT.  Sonographer:    Clayton Lefort RDCS (AE) Referring Phys: [id number] Lequita Halt  Sonographer Comments: Technically difficult study due to poor echo windows, suboptimal parasternal window, suboptimal apical window, suboptimal subcostal window and patient is morbidly obese. Image acquisition challenging due to patient body habitus. IMPRESSIONS  1. Left ventricular ejection fraction, by estimation, is 60 to 65%. The left ventricle has normal function. The left ventricle has no regional wall motion abnormalities. Left ventricular diastolic parameters were normal.  2. Right ventricular systolic function was not well visualized. The right ventricular size is not well visualized.  3. The mitral valve is normal in structure. Trivial mitral valve regurgitation.  4. The aortic valve is normal in structure. Aortic valve regurgitation is not visualized. No aortic stenosis is present. FINDINGS  Left Ventricle: Left ventricular ejection fraction, by estimation, is 60 to 65%. The left ventricle has normal function. The left ventricle has no regional wall motion abnormalities. Definity contrast agent was given IV to delineate the left ventricular  endocardial borders. The left ventricular internal cavity size was normal in size. There is borderline left ventricular hypertrophy. Left ventricular diastolic parameters were normal. Right Ventricle: The right ventricular size is not well visualized. Right vetricular wall thickness was not well visualized. Right ventricular systolic function was not well visualized. Left Atrium: Left atrial size was not well visualized. Right Atrium: Right atrial size was not well visualized. Pericardium: The pericardium was not well visualized. Mitral Valve: The mitral valve is normal in structure. Trivial mitral valve regurgitation. Tricuspid Valve: The tricuspid valve is not well visualized. Tricuspid valve regurgitation is trivial. Aortic Valve: The aortic valve is normal in  structure. Aortic valve regurgitation is not visualized. No aortic stenosis is present. Aortic valve mean gradient measures 4.0 mmHg. Aortic valve peak gradient measures 7.7 mmHg. Aortic valve area, by VTI measures 3.75 cm. Pulmonic Valve: The pulmonic valve was not well visualized. Pulmonic valve regurgitation is not visualized. Aorta: The aortic root and ascending aorta are structurally normal, with no evidence of dilitation. IAS/Shunts: The interatrial septum was not well visualized.  LEFT VENTRICLE PLAX 2D LVIDd:         5.30 cm  Diastology LVIDs:         3.30 cm  LV e' medial:    9.46 cm/s LV PW:         1.20 cm  LV E/e' medial:  8.7 LV IVS:        1.10 cm  LV e' lateral:   10.60 cm/s LVOT diam:     2.40 cm  LV E/e' lateral: 7.7 LV SV:         109 LV SV Index:   39 LVOT Area:     4.52 cm  IVC IVC diam: 2.80 cm AORTIC VALVE AV Area (Vmax):    3.74 cm AV Area (Vmean):   3.56 cm AV Area (VTI):     3.75 cm AV Vmax:           139.00 cm/s AV Vmean:          87.800 cm/s AV VTI:            0.292 m AV Peak Grad:      7.7 mmHg AV Mean Grad:      4.0 mmHg LVOT Vmax:         115.00 cm/s LVOT Vmean:        69.100 cm/s LVOT VTI:          0.242 m LVOT/AV VTI ratio: 0.83  AORTA Ao Root diam: 3.30 cm MITRAL VALVE MV Area (PHT): 2.66 cm    SHUNTS MV Decel Time: 285 msec    Systemic VTI:  0.24 m MV E velocity: 81.90 cm/s  Systemic Diam: 2.40 cm MV A velocity: 82.50 cm/s MV E/A ratio:  0.99 Mertie Moores MD Electronically signed by Mertie Moores MD Signature Date/Time: 12/28/2020/12:16:59 PM    Final     Scheduled Meds:  amLODipine  5 mg Oral Daily   apixaban  10 mg Oral BID   Followed by   Derrill Memo ON 01/05/2021] apixaban  5 mg Oral BID   aspirin EC  81 mg Oral Daily   atorvastatin  20 mg Oral Daily   furosemide  40 mg Oral Daily   ipratropium-albuterol  3 mL Nebulization BID   polyethylene glycol  17 g Oral Daily   Continuous Infusions:     LOS: 2 days   Marylu Lund, MD [facility] Pager On  Amion  If 7PM-7AM, please contact night-coverage 12/29/2020, 5:35 PM

## 2020-12-29 NOTE — Progress Notes (Signed)
Patient can bathe self. Everything was provided.

## 2020-12-30 DIAGNOSIS — I2699 Other pulmonary embolism without acute cor pulmonale: Secondary | ICD-10-CM | POA: Diagnosis not present

## 2020-12-30 LAB — CBC
HCT: 37 % (ref 36.0–46.0)
Hemoglobin: 11.4 g/dL — ABNORMAL LOW (ref 12.0–15.0)
MCH: 28.6 pg (ref 26.0–34.0)
MCHC: 30.8 g/dL (ref 30.0–36.0)
MCV: 93 fL (ref 80.0–100.0)
Platelets: 159 10*3/uL (ref 150–400)
RBC: 3.98 MIL/uL (ref 3.87–5.11)
RDW: 13.7 % (ref 11.5–15.5)
WBC: 3.7 10*3/uL — ABNORMAL LOW (ref 4.0–10.5)
nRBC: 0 % (ref 0.0–0.2)

## 2020-12-30 MED ORDER — FUROSEMIDE 40 MG PO TABS: 40.0000 mg | ORAL_TABLET | Freq: Every day | ORAL | 0 refills | Status: DC

## 2020-12-30 MED ORDER — APIXABAN (ELIQUIS) VTE STARTER PACK (10MG AND 5MG): ORAL_TABLET | ORAL | 0 refills | Status: DC

## 2020-12-30 MED ORDER — AMLODIPINE BESYLATE 5 MG PO TABS: 5.0000 mg | ORAL_TABLET | Freq: Every day | ORAL | 0 refills | Status: DC

## 2020-12-30 NOTE — Discharge Summary (Signed)
Physician Discharge Summary  Angel Brewer SNK:[id number] DOB: 12-18-1965 DOA: 12/27/2020  PCP: [name], NP  Admit date: 12/27/2020 Discharge date: 12/30/2020  Admitted From: Home Disposition:  Home  Recommendations for Outpatient Follow-up:  Follow up with PCP in 1-2 weeks  Discharge Condition:Stable CODE STATUS:Full Diet recommendation: Regular   Brief/Interim Summary:  55 y.o. female with medical history significant of DVT and PE, morbid obesity, OSA noncompliant with CPAP, presented with increasing shortness of breath. Pt found to have RLL PE  Discharge Diagnoses:  Active Problems:   Pulmonary emboli (HCC)  PE recurrent -Unprovoked and recurrent, indications for lifelong anticoagulation. -Has remained on heparin drip x 24hrs -2d echo reviewed, difficult to assess RV function on that study -LE dopplers reviewed, neg for DVT -was continued on O2 3LNC, successfully weaned to RA -Transitioned to eliquis   Impending hypoxic respite failure -Secondary to PE -weaned to room air -Pt is O2 naive   Peripheral edema likely secondary to acute on chronic diastolic CHF -Suspect underlying DVT given acute onset -cardiomegaly noted on CT chest -Continued on Lasix for now.   OSA with cpap -Pt reports defective cpap at home, currently in the process of being repaired -cont with cpap whle in hospital   Morbid obesity -Recommend outpatient bariatric evaluation -would recommend diet/lifestyle modification   HLD -Continue statin      Discharge Instructions   Allergies as of 12/30/2020   No Known Allergies      Medication List     STOP taking these medications    apixaban 5 MG Tabs tablet Commonly known as: Eliquis Replaced by: Apixaban Starter Pack (42m and 591m   Aspirin Low Dose 81 MG EC tablet Generic drug: aspirin       TAKE these medications    acetaminophen 650 MG CR tablet Commonly known as: TYLENOL Take 1,300 mg by mouth as needed for  pain. What changed: Another medication with the same name was removed. Continue taking this medication, and follow the directions you see here.   albuterol 108 (90 Base) MCG/ACT inhaler Commonly known as: VENTOLIN HFA Inhale 2 puffs into the lungs every 4 (four) hours as needed for wheezing or shortness of breath.   amLODipine 5 MG tablet Commonly known as: NORVASC Take 1 tablet (5 mg total) by mouth daily. Start taking on: December 31, 2020   Apixaban Starter Pack (1060mnd 5mg33mommonly known as: ELIQUIS STARTER PACK Take as directed on package: start with two-5mg 49mlets twice daily for 7 days. On day 8, switch to one-5mg t54met twice daily. Replaces: apixaban 5 MG Tabs tablet   atorvastatin 20 MG tablet Commonly known as: LIPITOR Take 1 tablet (20 mg total) by mouth daily.   Blood Pressure Monitor Devi Take blood pressure daily   furosemide 40 MG tablet Commonly known as: LASIX Take 1 tablet (40 mg total) by mouth daily.   Misc. Devices Misc Please provide insurance approved CPAP nasal mask. ICD 10 G47.33 Z99.89   polyethylene glycol 17 g packet Commonly known as: MIRALAX / GLYCOLAX Take 17 g by mouth daily. What changed:  when to take this reasons to take this   PRESCRIPTION MEDICATION CPAP- At bedtime   senna-docusate 8.6-50 MG tablet Commonly known as: Senokot-S Take 1 tablet by mouth at bedtime as needed for mild constipation.        Follow-up Information     FleminGildardo PoundsSchedule an appointment as soon as possible for a visit.   Specialty: Nurse Practitioner  Why: Hospital follow up Contact information: [place] [address] [phone]                No Known Allergies   Procedures/Studies: DG Chest 2 View  Result Date: 12/27/2020 CLINICAL DATA:  55 year old female with shortness of breath progressing this week. EXAM: CHEST - 2 VIEW COMPARISON:  Portable chest 08/25/2020 and earlier. FINDINGS: Stable cardiomegaly  and mediastinal contours. Chronically somewhat low lung volumes. Visualized tracheal air column is within normal limits. No pneumothorax, pulmonary edema, pleural effusion or confluent pulmonary opacity. No acute osseous abnormality identified. Negative visible bowel gas pattern. IMPRESSION: Stable cardiomegaly and low lung volumes. No acute cardiopulmonary abnormality. Electronically Signed   By: Genevie Ann M.D.   On: 12/27/2020 07:50   CT Angio Chest PE W/Cm &/Or Wo Cm  Result Date: 12/27/2020 CLINICAL DATA:  Shortness of breath and lower extremity edema EXAM: CT ANGIOGRAPHY CHEST WITH CONTRAST TECHNIQUE: Multidetector CT imaging of the chest was performed using the standard protocol during bolus administration of intravenous contrast. Multiplanar CT image reconstructions and MIPs were obtained to evaluate the vascular anatomy. CONTRAST:  42m OMNIPAQUE IOHEXOL 350 MG/ML SOLN COMPARISON:  Chest radiograph December 27, 2020; CT angiogram chest August 18, 2020 FINDINGS: Cardiovascular: There are pulmonary emboli in peripheral anterior and lateral lateral right lower lobe arterial branches. No more central pulmonary emboli are evident. There is chronic cardiomegaly which is stable. There is not felt to be right heart strain secondary to localize segmental right lower lobe pulmonary emboli in the absence of larger pulmonary emboli elsewhere. Visualized great vessels appear normal. No appreciable thoracic aortic aneurysm or dissection. No pericardial effusion or pericardial thickening. Main pulmonary outflow tract measures 4.0 cm, consistent with pulmonary arterial hypertension. Mediastinum/Nodes: Thyroid unremarkable. There is a stable lymph node in the right hilar region measuring 1.2 x 1.0 cm. Stable subcarinal lymph node measuring 1.3 x 1.1 cm. Right paratracheal lymph node measures 1.3 x 1.2 cm, smaller than on previous study. No new foci of lymph node enlargement. No esophageal lesions. Lungs/Pleura: Lungs again  demonstrate a somewhat mosaic pattern of attenuation which is stable. No consolidation or edema. No pleural effusions. No tracheal or major bronchial lesions evident. No pneumothorax. Upper Abdomen: Visualized upper abdominal structures appear unremarkable. Musculoskeletal: There is degenerative change in the thoracic spine with diffuse idiopathic skeletal hyperostosis. No blastic or lytic bone lesions. No chest wall lesions. Review of the MIP images confirms the above findings. IMPRESSION: 1. Pulmonary emboli in anterior and lateral segmental right lower lobe pulmonary artery branches. No more central pulmonary embolus evident. There is cardiomegaly, stable,. Stable prominence of the right heart without findings felt to be indicative of right heart strain secondary to the segmental right lower lobe pulmonary emboli. 2. Enlargement of the main pulmonary outflow tract indicative of pulmonary arterial hypertension. 3. Mosaic pattern of attenuation in the lungs, stable, likely representing underlying small airways obstructive disease. No edema or consolidation. 4. Mild chronic adenopathy, with lymph node in the right paratracheal region smaller than on previous study. No new adenopathy. 5.  Diffuse idiopathic skeletal hyperostosis in the thoracic spine. Critical Value/emergent results were called by telephone at the time of interpretation on 12/27/2020 at 11:48 am to provider RCarmin Muskrat, who verbally acknowledged these results. Electronically Signed   By: WLowella GripIII M.D.   On: 12/27/2020 11:48   ECHOCARDIOGRAM COMPLETE  Result Date: 12/28/2020    ECHOCARDIOGRAM REPORT   Patient Name:   Angel  Brewer Date of Exam: 12/28/2020 Medical Rec #:  [id number]      Height:       68.0 in Accession #:    [id number]     Weight:       415.0 lb Date of Birth:  24-Jul-1965      BSA:          2.787 m Patient Age:    55 years       BP:           137/72 mmHg Patient Gender: F              HR:           57 bpm. Exam  Location:  Inpatient Procedure: 2D Echo, Cardiac Doppler, Color Doppler and Intracardiac            Opacification Agent Indications:    Pulmonary embolus  History:        Patient has prior history of Echocardiogram examinations, most                 recent 08/19/2020. Risk Factors:Sleep Apnea. Hx DVT.  Sonographer:    Clayton Lefort RDCS (AE) Referring Phys: [id number] Lequita Halt  Sonographer Comments: Technically difficult study due to poor echo windows, suboptimal parasternal window, suboptimal apical window, suboptimal subcostal window and patient is morbidly obese. Image acquisition challenging due to patient body habitus. IMPRESSIONS  1. Left ventricular ejection fraction, by estimation, is 60 to 65%. The left ventricle has normal function. The left ventricle has no regional wall motion abnormalities. Left ventricular diastolic parameters were normal.  2. Right ventricular systolic function was not well visualized. The right ventricular size is not well visualized.  3. The mitral valve is normal in structure. Trivial mitral valve regurgitation.  4. The aortic valve is normal in structure. Aortic valve regurgitation is not visualized. No aortic stenosis is present. FINDINGS  Left Ventricle: Left ventricular ejection fraction, by estimation, is 60 to 65%. The left ventricle has normal function. The left ventricle has no regional wall motion abnormalities. Definity contrast agent was given IV to delineate the left ventricular  endocardial borders. The left ventricular internal cavity size was normal in size. There is borderline left ventricular hypertrophy. Left ventricular diastolic parameters were normal. Right Ventricle: The right ventricular size is not well visualized. Right vetricular wall thickness was not well visualized. Right ventricular systolic function was not well visualized. Left Atrium: Left atrial size was not well visualized. Right Atrium: Right atrial size was not well visualized. Pericardium: The  pericardium was not well visualized. Mitral Valve: The mitral valve is normal in structure. Trivial mitral valve regurgitation. Tricuspid Valve: The tricuspid valve is not well visualized. Tricuspid valve regurgitation is trivial. Aortic Valve: The aortic valve is normal in structure. Aortic valve regurgitation is not visualized. No aortic stenosis is present. Aortic valve mean gradient measures 4.0 mmHg. Aortic valve peak gradient measures 7.7 mmHg. Aortic valve area, by VTI measures 3.75 cm. Pulmonic Valve: The pulmonic valve was not well visualized. Pulmonic valve regurgitation is not visualized. Aorta: The aortic root and ascending aorta are structurally normal, with no evidence of dilitation. IAS/Shunts: The interatrial septum was not well visualized.  LEFT VENTRICLE PLAX 2D LVIDd:         5.30 cm  Diastology LVIDs:         3.30 cm  LV e' medial:    9.46 cm/s LV PW:         1.20 cm  LV E/e' medial:  8.7 LV IVS:        1.10 cm  LV e' lateral:   10.60 cm/s LVOT diam:     2.40 cm  LV E/e' lateral: 7.7 LV SV:         109 LV SV Index:   39 LVOT Area:     4.52 cm  IVC IVC diam: 2.80 cm AORTIC VALVE AV Area (Vmax):    3.74 cm AV Area (Vmean):   3.56 cm AV Area (VTI):     3.75 cm AV Vmax:           139.00 cm/s AV Vmean:          87.800 cm/s AV VTI:            0.292 m AV Peak Grad:      7.7 mmHg AV Mean Grad:      4.0 mmHg LVOT Vmax:         115.00 cm/s LVOT Vmean:        69.100 cm/s LVOT VTI:          0.242 m LVOT/AV VTI ratio: 0.83  AORTA Ao Root diam: 3.30 cm MITRAL VALVE MV Area (PHT): 2.66 cm    SHUNTS MV Decel Time: 285 msec    Systemic VTI:  0.24 m MV E velocity: 81.90 cm/s  Systemic Diam: 2.40 cm MV A velocity: 82.50 cm/s MV E/A ratio:  0.99 Mertie Moores MD Electronically signed by Mertie Moores MD Signature Date/Time: 12/28/2020/12:16:59 PM    Final    VAS [place] LOWER EXTREMITY VENOUS (DVT)  Result Date: 12/27/2020  Lower Venous DVT Study Patient Name:  Angel Brewer  Date of Exam:   12/27/2020 Medical  Rec #: [id number]       Accession #:    [id number] Date of Birth: 12-28-1965       Patient Gender: F Patient Age:   68Y Exam Location:  [facility] Procedure:      VAS [place] LOWER EXTREMITY VENOUS (DVT) Referring Phys: [id number] Lequita Halt --------------------------------------------------------------------------------  Indications: Swelling.  Risk Factors: None identified. Limitations: Body habitus and poor ultrasound/tissue interface. Comparison Study: No prior studies. Performing Technologist: Oliver Hum RVT  Examination Guidelines: A complete evaluation includes B-mode imaging, spectral Doppler, color Doppler, and power Doppler as needed of all accessible portions of each vessel. Bilateral testing is considered an integral part of a complete examination. Limited examinations for reoccurring indications may be performed as noted. The reflux portion of the exam is performed with the patient in reverse Trendelenburg.  +---------+---------------+---------+-----------+----------+--------------+ RIGHT    CompressibilityPhasicitySpontaneityPropertiesThrombus Aging +---------+---------------+---------+-----------+----------+--------------+ CFV      Full           Yes      Yes                                 +---------+---------------+---------+-----------+----------+--------------+ SFJ      Full                                                        +---------+---------------+---------+-----------+----------+--------------+ FV Prox  Full                                                        +---------+---------------+---------+-----------+----------+--------------+  FV Mid                  Yes      Yes                                 +---------+---------------+---------+-----------+----------+--------------+ FV Distal               Yes      Yes                                 +---------+---------------+---------+-----------+----------+--------------+ PFV      Full                                                         +---------+---------------+---------+-----------+----------+--------------+ POP      Full           Yes      Yes                                 +---------+---------------+---------+-----------+----------+--------------+ PTV      Full                                                        +---------+---------------+---------+-----------+----------+--------------+ PERO     Full                                                        +---------+---------------+---------+-----------+----------+--------------+   +---------+---------------+---------+-----------+----------+-------------------+ LEFT     CompressibilityPhasicitySpontaneityPropertiesThrombus Aging      +---------+---------------+---------+-----------+----------+-------------------+ CFV      Full           Yes      Yes                                      +---------+---------------+---------+-----------+----------+-------------------+ SFJ      Full                                                             +---------+---------------+---------+-----------+----------+-------------------+ FV Prox  Full                                                             +---------+---------------+---------+-----------+----------+-------------------+ FV Mid  Not well visualized +---------+---------------+---------+-----------+----------+-------------------+ FV Distal                                             Not well visualized +---------+---------------+---------+-----------+----------+-------------------+ PFV      Full                                                             +---------+---------------+---------+-----------+----------+-------------------+ POP      Full           Yes      Yes                                       +---------+---------------+---------+-----------+----------+-------------------+ PTV      Full                                                             +---------+---------------+---------+-----------+----------+-------------------+ PERO     Full                                                             +---------+---------------+---------+-----------+----------+-------------------+     Summary: RIGHT: - There is no evidence of deep vein thrombosis in the lower extremity. However, portions of this examination were limited- see technologist comments above.  - No cystic structure found in the popliteal fossa.  LEFT: - There is no evidence of deep vein thrombosis in the lower extremity. However, portions of this examination were limited- see technologist comments above.  - No cystic structure found in the popliteal fossa.  *See table(s) above for measurements and observations. Electronically signed by Jamelle Haring on 12/27/2020 at 2:18:23 PM.    Final     Subjective: Eager to go home  Discharge Exam: Vitals:   12/30/20 0548 12/30/20 0819  BP: 119/72   Pulse: (!) 59   Resp:    Temp: 98 F (36.7 C)   SpO2: 100% 91%   Vitals:   12/29/20 2142 12/30/20 0135 12/30/20 0548 12/30/20 0819  BP: (!) 144/80 137/63 119/72   Pulse: 65  (!) 59   Resp:  18    Temp:  (!) 97.5 F (36.4 C) 98 F (36.7 C)   TempSrc:  Axillary    SpO2: (!) 81% 93% 100% 91%  Weight:      Height:        General: Pt is alert, awake, not in acute distress Cardiovascular: RRR, S1/S2 + Respiratory: CTA bilaterally, no wheezing, no rhonchi Abdominal: Soft, NT, ND, bowel sounds + Extremities: no edema, no cyanosis   The results of significant diagnostics from this hospitalization (including imaging, microbiology, ancillary and laboratory) are listed below for reference.     Microbiology: Recent Results (from the past 240 hour(s))  Resp Panel by RT-PCR (Flu A&B, Covid) Nasopharyngeal Swab     Status: None    Collection Time: 12/27/20 11:55 AM   Specimen: Nasopharyngeal Swab; Nasopharyngeal(NP) swabs in vial transport medium  Result Value Ref Range Status   SARS Coronavirus 2 by RT PCR NEGATIVE NEGATIVE Final    Comment: (NOTE) SARS-CoV-2 target nucleic acids are NOT DETECTED.  The SARS-CoV-2 RNA is generally detectable in upper respiratory specimens during the acute phase of infection. The lowest concentration of SARS-CoV-2 viral copies this assay can detect is 138 copies/mL. A negative result does not preclude SARS-Cov-2 infection and should not be used as the sole basis for treatment or other patient management decisions. A negative result may occur with  improper specimen collection/handling, submission of specimen other than nasopharyngeal swab, presence of viral mutation(s) within the areas targeted by this assay, and inadequate number of viral copies(<138 copies/mL). A negative result must be combined with clinical observations, patient history, and epidemiological information. The expected result is Negative.  Fact Sheet for Patients:  EntrepreneurPulse.com.au  Fact Sheet for Healthcare Providers:  IncredibleEmployment.be  This test is no t yet approved or cleared by the [place] FDA and  has been authorized for detection and/or diagnosis of SARS-CoV-2 by FDA under an Emergency Use Authorization (EUA). This EUA will remain  in effect (meaning this test can be used) for the duration of the COVID-19 declaration under Section 564(b)(1) of the Act, 21 U.S.C.section 360bbb-3(b)(1), unless the authorization is terminated  or revoked sooner.       Influenza A by PCR NEGATIVE NEGATIVE Final   Influenza B by PCR NEGATIVE NEGATIVE Final    Comment: (NOTE) The Xpert Xpress SARS-CoV-2/FLU/RSV plus assay is intended as an aid in the diagnosis of influenza from Nasopharyngeal swab specimens and should not be used as a sole basis for treatment.  Nasal washings and aspirates are unacceptable for Xpert Xpress SARS-CoV-2/FLU/RSV testing.  Fact Sheet for Patients: EntrepreneurPulse.com.au  Fact Sheet for Healthcare Providers: IncredibleEmployment.be  This test is not yet approved or cleared by the [place] FDA and has been authorized for detection and/or diagnosis of SARS-CoV-2 by FDA under an Emergency Use Authorization (EUA). This EUA will remain in effect (meaning this test can be used) for the duration of the COVID-19 declaration under Section 564(b)(1) of the Act, 21 U.S.C. section 360bbb-3(b)(1), unless the authorization is terminated or revoked.  Performed at [facility], [place] [address], [place], [place] [address]      Labs: BNP (last 3 results) Recent Labs    08/18/20 1832 08/25/20 0433  BNP 30.6 94.5   Basic Metabolic Panel: Recent Labs  Lab 12/27/20 0714  NA 140  K 4.2  CL 105  CO2 28  GLUCOSE 83  BUN 14  CREATININE 0.86  CALCIUM 8.9   Liver Function Tests: No results for input(s): AST, ALT, ALKPHOS, BILITOT, PROT, ALBUMIN in the last 168 hours. No results for input(s): LIPASE, AMYLASE in the last 168 hours. No results for input(s): AMMONIA in the last 168 hours. CBC: Recent Labs  Lab 12/27/20 0714 12/28/20 0227 12/29/20 0500 12/30/20 0118  WBC 3.8* 4.6 3.5* 3.7*  HGB 11.4* 11.0* 12.1 11.4*  HCT 37.2 36.8 39.4 37.0  MCV 95.1 94.8 95.6 93.0  PLT 185 167 191 159   Cardiac Enzymes: No results for input(s): CKTOTAL, CKMB, CKMBINDEX, TROPONINI in the last 168 hours. BNP: Invalid input(s): POCBNP CBG: No results for input(s): GLUCAP in the last 168 hours. D-Dimer No  results for input(s): DDIMER in the last 72 hours. Hgb A1c No results for input(s): HGBA1C in the last 72 hours. Lipid Profile No results for input(s): CHOL, HDL, LDLCALC, TRIG, CHOLHDL, LDLDIRECT in the last 72 hours. Thyroid function studies No results for input(s): TSH,  T4TOTAL, T3FREE, THYROIDAB in the last 72 hours.  Invalid input(s): FREET3 Anemia work up No results for input(s): VITAMINB12, FOLATE, FERRITIN, TIBC, IRON, RETICCTPCT in the last 72 hours. Urinalysis    Component Value Date/Time   COLORURINE AMBER (A) 11/23/2019 0220   APPEARANCEUR CLOUDY (A) 11/23/2019 0220   LABSPEC 1.027 11/23/2019 0220   PHURINE 5.0 11/23/2019 0220   GLUCOSEU NEGATIVE 11/23/2019 0220   HGBUR NEGATIVE 11/23/2019 0220   BILIRUBINUR NEGATIVE 11/23/2019 0220   KETONESUR NEGATIVE 11/23/2019 0220   PROTEINUR 100 (A) 11/23/2019 0220   NITRITE NEGATIVE 11/23/2019 0220   LEUKOCYTESUR NEGATIVE 11/23/2019 0220   Sepsis Labs Invalid input(s): PROCALCITONIN,  WBC,  LACTICIDVEN Microbiology Recent Results (from the past 240 hour(s))  Resp Panel by RT-PCR (Flu A&B, Covid) Nasopharyngeal Swab     Status: None   Collection Time: 12/27/20 11:55 AM   Specimen: Nasopharyngeal Swab; Nasopharyngeal(NP) swabs in vial transport medium  Result Value Ref Range Status   SARS Coronavirus 2 by RT PCR NEGATIVE NEGATIVE Final    Comment: (NOTE) SARS-CoV-2 target nucleic acids are NOT DETECTED.  The SARS-CoV-2 RNA is generally detectable in upper respiratory specimens during the acute phase of infection. The lowest concentration of SARS-CoV-2 viral copies this assay can detect is 138 copies/mL. A negative result does not preclude SARS-Cov-2 infection and should not be used as the sole basis for treatment or other patient management decisions. A negative result may occur with  improper specimen collection/handling, submission of specimen other than nasopharyngeal swab, presence of viral mutation(s) within the areas targeted by this assay, and inadequate number of viral copies(<138 copies/mL). A negative result must be combined with clinical observations, patient history, and epidemiological information. The expected result is Negative.  Fact Sheet for Patients:   EntrepreneurPulse.com.au  Fact Sheet for Healthcare Providers:  IncredibleEmployment.be  This test is no t yet approved or cleared by the [place] FDA and  has been authorized for detection and/or diagnosis of SARS-CoV-2 by FDA under an Emergency Use Authorization (EUA). This EUA will remain  in effect (meaning this test can be used) for the duration of the COVID-19 declaration under Section 564(b)(1) of the Act, 21 U.S.C.section 360bbb-3(b)(1), unless the authorization is terminated  or revoked sooner.       Influenza A by PCR NEGATIVE NEGATIVE Final   Influenza B by PCR NEGATIVE NEGATIVE Final    Comment: (NOTE) The Xpert Xpress SARS-CoV-2/FLU/RSV plus assay is intended as an aid in the diagnosis of influenza from Nasopharyngeal swab specimens and should not be used as a sole basis for treatment. Nasal washings and aspirates are unacceptable for Xpert Xpress SARS-CoV-2/FLU/RSV testing.  Fact Sheet for Patients: EntrepreneurPulse.com.au  Fact Sheet for Healthcare Providers: IncredibleEmployment.be  This test is not yet approved or cleared by the [place] FDA and has been authorized for detection and/or diagnosis of SARS-CoV-2 by FDA under an Emergency Use Authorization (EUA). This EUA will remain in effect (meaning this test can be used) for the duration of the COVID-19 declaration under Section 564(b)(1) of the Act, 21 U.S.C. section 360bbb-3(b)(1), unless the authorization is terminated or revoked.  Performed at [facility], [place] [address], [place], [place] [address]    Time  spent: 30 min  SIGNED:   Marylu Lund, MD  [facility] 12/30/2020, 3:01 PM  If 7PM-7AM, please contact night-coverage

## 2020-12-30 NOTE — Progress Notes (Signed)
Patient Saturations on Room Air at Rest = 95%  Patient Saturations on [employer] while Ambulating = 88%  Patient Saturations on 2 Liters of oxygen while Ambulating = 94%  Please briefly explain why patient needs home oxygen: pt desat's in the 80 while ambulating without O2.

## 2020-12-31 ENCOUNTER — Telehealth: Payer: Self-pay | Admitting: *Deleted

## 2020-12-31 NOTE — Telephone Encounter (Signed)
Transition Care Management Unsuccessful Follow-up Telephone Call  Date of discharge and from where:  12/30/2020 - [facility]  Attempts:  1st Attempt  Reason for unsuccessful TCM follow-up call:  Unable to leave message

## 2021-01-01 NOTE — Telephone Encounter (Signed)
Transition Care Management Follow-up Telephone Call Date of discharge and from where: 12/30/2020 from [facility] How have you been since you were released from the hospital? Pt stated that she is feeling well.  Any questions or concerns? No  Items Reviewed: Did the pt receive and understand the discharge instructions provided? Yes  Medications obtained and verified? Yes  Other? No  Any new allergies since your discharge? No  Dietary orders reviewed? Yes Do you have support at home? Yes    Functional Questionnaire: (I = Independent and D = Dependent) ADLs: I  Bathing/Dressing- I  Meal Prep- I  Eating- I  Maintaining continence- I  Transferring/Ambulation- I  Managing Meds- I   Follow up appointments reviewed:  PCP Hospital f/u appt confirmed? Yes  Scheduled to see Gerre Pebbles, NP on 02/05/2021 @ 1:30pm. [facility] f/u appt confirmed? No   Are transportation arrangements needed? No  If their condition worsens, is the pt aware to call PCP or go to the Emergency Dept.? Yes Was the patient provided with contact information for the PCP's office or ED? Yes Was to pt encouraged to call back with questions or concerns? Yes

## 2021-01-29 ENCOUNTER — Other Ambulatory Visit: Payer: Self-pay | Admitting: Nurse Practitioner

## 2021-01-29 NOTE — Telephone Encounter (Signed)
Notes to clinic:  The original prescription was discontinued on 09/03/2020 by [name], NP for the following reason: Discontinued by provider. Renewing this prescription may not be appropriate.   Requested Prescriptions  Pending Prescriptions Disp Refills   ASPIRIN LOW DOSE 81 MG EC tablet [Pharmacy Med Name: ASPIRIN EC 81 MG TABLET] 120 tablet 0    Sig: TAKE 1 TABLET (81 MG TOTAL) BY MOUTH DAILY. SWALLOW WHOLE.      Analgesics:  NSAIDS - aspirin Passed - 01/29/2021  8:05 AM      Passed - Patient is not pregnant      Passed - Valid encounter within last 12 months    Recent Outpatient Visits           5 months ago Hospital discharge follow-up   [place] Goldsmith, Vernia Buff, NP   11 months ago Chronic deep vein thrombosis (DVT) of femoral vein of left lower extremity [facility])   [place] Elsie Stain, MD   1 year ago Recurrent acute deep vein thrombosis (DVT) of right lower extremity [facility])   [place] [name], NP   1 year ago History of pulmonary embolism   [place], Enobong, MD   3 years ago Essential hypertension   [place], Zelda W, NP

## 2021-02-01 ENCOUNTER — Other Ambulatory Visit: Payer: Self-pay | Admitting: Pharmacist

## 2021-02-01 MED ORDER — APIXABAN 5 MG PO TABS: 5.0000 mg | ORAL_TABLET | Freq: Two times a day (BID) | ORAL | 0 refills | Status: DC

## 2021-02-05 ENCOUNTER — Ambulatory Visit: Payer: Medicaid Other | Attending: Nurse Practitioner | Admitting: Nurse Practitioner

## 2021-02-05 ENCOUNTER — Other Ambulatory Visit: Payer: Self-pay

## 2021-02-05 DIAGNOSIS — Z124 Encounter for screening for malignant neoplasm of cervix: Secondary | ICD-10-CM

## 2021-02-05 DIAGNOSIS — Z7901 Long term (current) use of anticoagulants: Secondary | ICD-10-CM

## 2021-02-05 DIAGNOSIS — G4733 Obstructive sleep apnea (adult) (pediatric): Secondary | ICD-10-CM | POA: Diagnosis not present

## 2021-02-05 DIAGNOSIS — I2699 Other pulmonary embolism without acute cor pulmonale: Secondary | ICD-10-CM | POA: Diagnosis not present

## 2021-02-05 DIAGNOSIS — R0609 Other forms of dyspnea: Secondary | ICD-10-CM | POA: Diagnosis not present

## 2021-02-05 DIAGNOSIS — Z1231 Encounter for screening mammogram for malignant neoplasm of breast: Secondary | ICD-10-CM

## 2021-02-05 DIAGNOSIS — Z1211 Encounter for screening for malignant neoplasm of colon: Secondary | ICD-10-CM

## 2021-02-05 DIAGNOSIS — I10 Essential (primary) hypertension: Secondary | ICD-10-CM | POA: Diagnosis not present

## 2021-02-05 DIAGNOSIS — Z9989 Dependence on other enabling machines and devices: Secondary | ICD-10-CM

## 2021-02-05 DIAGNOSIS — R030 Elevated blood-pressure reading, without diagnosis of hypertension: Secondary | ICD-10-CM

## 2021-02-05 MED ORDER — FUROSEMIDE 40 MG PO TABS: 40.0000 mg | ORAL_TABLET | Freq: Every day | ORAL | 1 refills | Status: DC

## 2021-02-05 MED ORDER — ALBUTEROL SULFATE HFA 108 (90 BASE) MCG/ACT IN AERS: 2.0000 | INHALATION_SPRAY | RESPIRATORY_TRACT | 0 refills | Status: DC | PRN

## 2021-02-05 MED ORDER — AMLODIPINE BESYLATE 5 MG PO TABS: 5.0000 mg | ORAL_TABLET | Freq: Every day | ORAL | 1 refills | Status: DC

## 2021-02-05 MED ORDER — MISC. DEVICES MISC: 0 refills | Status: DC

## 2021-02-05 MED ORDER — APIXABAN 5 MG PO TABS: 5.0000 mg | ORAL_TABLET | Freq: Two times a day (BID) | ORAL | 3 refills | Status: DC

## 2021-02-05 MED ORDER — BLOOD PRESSURE MONITOR DEVI: 0 refills | Status: DC

## 2021-02-05 NOTE — Progress Notes (Signed)
Virtual Visit via Telephone Note Due to national recommendations of social distancing due to [place] 19, telehealth visit is felt to be most appropriate for this patient at this time.  I discussed the limitations, risks, security and privacy concerns of performing an evaluation and management service by telephone and the availability of in person appointments. I also discussed with the patient that there may be a patient responsible charge related to this service. The patient expressed understanding and agreed to proceed.    I connected with Angel Brewer on 02/05/21  at   1:30 PM EDT  EDT by telephone and verified that I am speaking with the correct person using two identifiers.  Location of Patient: Private Residence   Location of Provider: [place] and [place] participating in Telemedicine visit: [name] FNP-BC [place]    History of Present Illness: Telemedicine visit for: HFU She has a past medical history of DVT in pregnancy with recurrent DVT 2017, Morbid obesity, PE (02/29/2016) and recurrent PE 2022 and Sleep apnea and non compliance with CPAP.   Admitted to hospital on 6-16 with RLL PE. Unprovoked and recurrent and will now require lifelong anticoagulation. LE doppler negative for DVT. She was transitioned from IV heparin to eliquis on discharge.  She requires CPAP at home. Current CPAP machines is defective.  Last sleep study was over 4 years ago. She will require a new sleep study prior to ordering a new CPAP machine.  Symptoms of sleep apnea include:  Sleep Apnea: Patient presents with possible obstructive sleep apnea. Patent has a 5 year history of symptoms of daytime fatigue and Epworth sleepiness scale. Patient generally gets a few hours of sleep per night, and states they generally have nightime awakenings and difficulty falling back asleep if awakened. Snoring of severe severity is present. Apneic episodes are present. Nasal  obstruction is not present.  Patient has not had tonsillectomy.       Past Medical History:  Diagnosis Date   DVT (deep vein thrombosis) in pregnancy    RLE DVT 02/2016   Dyspnea    Morbid obesity (HCC)    PE (pulmonary embolism) 02/29/2016   Sleep apnea    uses a cpap    Past Surgical History:  Procedure Laterality Date   CESAREAN SECTION     x 3   OPEN REDUCTION INTERNAL FIXATION (ORIF) DISTAL RADIAL FRACTURE Right 06/20/2019   Procedure: OPEN REDUCTION INTERNAL FIXATION (ORIF) DISTAL RADIAL FRACTURE;  Surgeon: [name], MD;  Location: [place];  Service: Orthopedics;  Laterality: Right;  REGIONAL BLOCK TOTAL SURGERY TIME: 90 MINUTES   SYNOVECTOMY Right 11/21/2019   Procedure: RIGHT WRIST LIGAMENT RECONSTRUCTION;  Surgeon: [name], MD;  Location: [place];  Service: Orthopedics;  Laterality: Right;  PROCEDURE: RIGHT WRIST LIGAMENT RECONSTRUCTION LENGTH OF SURGERY: 105 MIN    Family History  Problem Relation Age of Onset   Hypertension Sister     Social History   Socioeconomic History   Marital status: Single    Spouse name: Not on file   Number of children: 3   Years of education: Not on file   Highest education level: Not on file  Occupational History   Not on file  Tobacco Use   Smoking status: Former   Smokeless tobacco: Never   Tobacco comments:    smoked only as a teenager  Vaping Use   Vaping Use: Never used  Substance and Sexual Activity   Alcohol use: No  Drug use: No   Sexual activity: Not on file  Other Topics Concern   Not on file  Social History Narrative   Not on file   Social Determinants of Health   Financial Resource Strain: Not on file  Food Insecurity: Not on file  Transportation Needs: Not on file  Physical Activity: Not on file  Stress: Not on file  Social Connections: Not on file     Observations/Objective: Awake, alert and oriented x 3   Review of Systems  Constitutional:  Negative for fever, malaise/fatigue and  weight loss.  HENT: Negative.  Negative for nosebleeds.   Eyes: Negative.  Negative for blurred vision, double vision and photophobia.  Respiratory:  Positive for shortness of breath (chronic). Negative for cough.   Cardiovascular: Negative.  Negative for chest pain, palpitations and leg swelling.  Gastrointestinal: Negative.  Negative for heartburn, nausea and vomiting.  Musculoskeletal: Negative.  Negative for myalgias.  Neurological: Negative.  Negative for dizziness, focal weakness, seizures and headaches.  Psychiatric/Behavioral: Negative.  Negative for suicidal ideas.    Assessment and Plan: Diagnoses and all orders for this visit:  Recurrent pulmonary emboli (HCC) -     apixaban (ELIQUIS) 5 MG TABS tablet; Take 1 tablet (5 mg total) by mouth 2 (two) times daily.  Colon cancer screening -     Ambulatory referral to Gastroenterology  Encounter for Papanicolaou smear for cervical cancer screening -     Ambulatory referral to Gynecology  OSA on CPAP -     Misc. Devices MISC; Please provide insurance approved CPAP nasal mask. ICD 10 G47.33 Z99.89  Elevated blood pressure reading -     Blood Pressure Monitor DEVI; Please provide with medically approved xl blood pressure monitoring device. ICD I10.0  Essential hypertension -     amLODipine (NORVASC) 5 MG tablet; Take 1 tablet (5 mg total) by mouth daily. -     furosemide (LASIX) 40 MG tablet; Take 1 tablet (40 mg total) by mouth daily. -     Blood Pressure Monitor DEVI; Please provide with medically approved xl blood pressure monitoring device. ICD I10.0  Chronic anticoagulation -     apixaban (ELIQUIS) 5 MG TABS tablet; Take 1 tablet (5 mg total) by mouth 2 (two) times daily.  Other form of dyspnea -     albuterol (VENTOLIN HFA) 108 (90 Base) MCG/ACT inhaler; Inhale 2 puffs into the lungs every 4 (four) hours as needed for wheezing or shortness of breath.    Follow Up Instructions Return in about 3 months (around 05/08/2021).      I discussed the assessment and treatment plan with the patient. The patient was provided an opportunity to ask questions and all were answered. The patient agreed with the plan and demonstrated an understanding of the instructions.   The patient was advised to call back or seek an in-person evaluation if the symptoms worsen or if the condition fails to improve as anticipated.  I provided 14 minutes of non-face-to-face time during this encounter including median intraservice time, reviewing previous notes, labs, imaging, medications and explaining diagnosis and management.  [name], FNP-BC

## 2021-02-07 ENCOUNTER — Encounter: Payer: Self-pay | Admitting: Nurse Practitioner

## 2021-04-05 ENCOUNTER — Ambulatory Visit: Payer: Medicaid Other

## 2021-04-10 ENCOUNTER — Other Ambulatory Visit: Payer: Self-pay | Admitting: Nurse Practitioner

## 2021-04-10 NOTE — Telephone Encounter (Signed)
Medication discontinued 09/03/20 by PCP and after hospital discharge on 12/30/20

## 2021-04-25 ENCOUNTER — Ambulatory Visit: Payer: Medicaid Other

## 2021-04-28 IMAGING — DX DG CHEST 1V PORT
1 series · 1 of 1 positions shown · non-contrast
Comparison: Portable chest 08/21/2020 and earlier.

CLINICAL DATA: 55-year-old female with shortness of breath.
Cardiomegaly.

EXAM:
PORTABLE CHEST 1 VIEW

[chest ap]
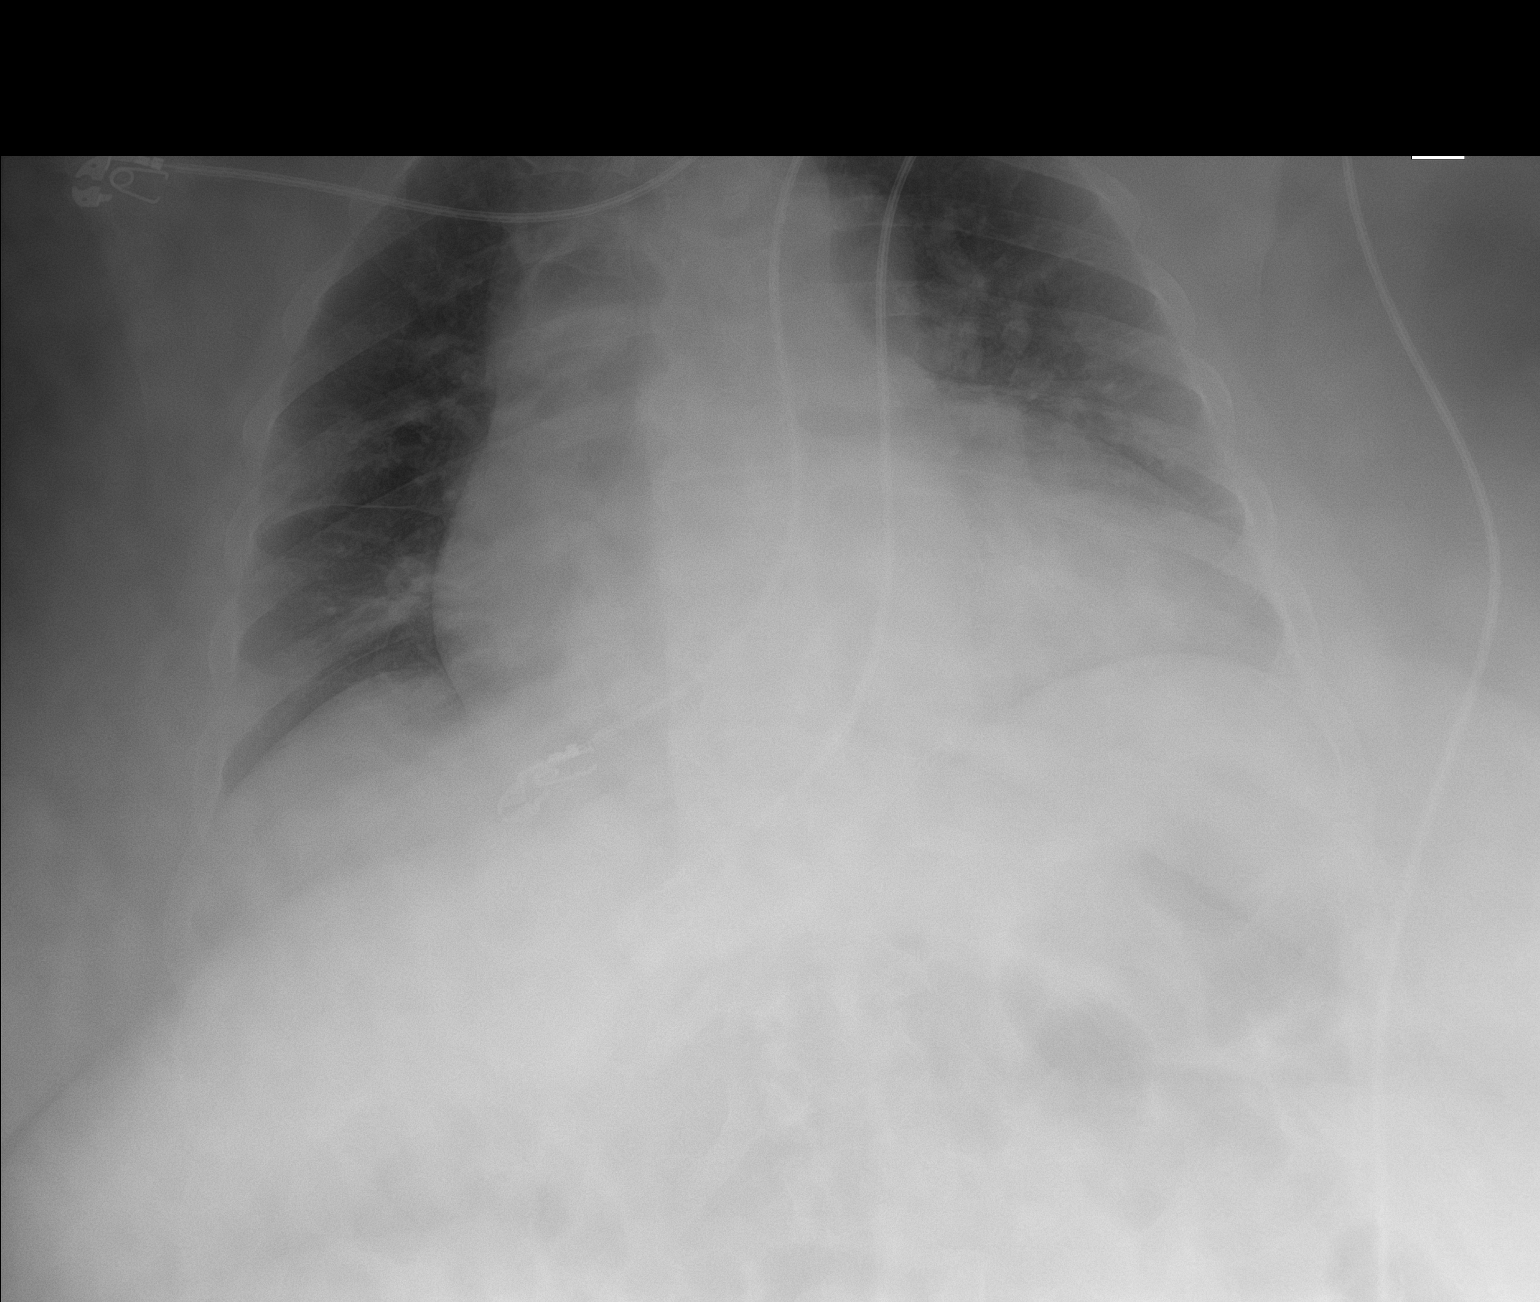

[1 of 1 positions shown; findings below may reference images not displayed]

FINDINGS: Portable AP semi upright view at 8933 hours. Mildly improved lung
volumes. Stable cardiomegaly and mediastinal contours. Decreased
bilateral pulmonary vascularity/interstitial opacity. No
pneumothorax, pleural effusion or consolidation. Negative visible
bowel gas and osseous structures.
IMPRESSION: 1. Improved ventilation and largely resolved interstitial edema.
2. Stable cardiomegaly.  No new cardiopulmonary abnormality.

## 2021-04-29 IMAGING — DX DG CHEST 1V PORT
1 series · 1 of 1 positions shown · non-contrast
Comparison: Portable chest 08/23/2020 and earlier.

CLINICAL DATA: 55-year-old female with shortness of breath.

EXAM:
PORTABLE CHEST 1 VIEW

[chest ap]
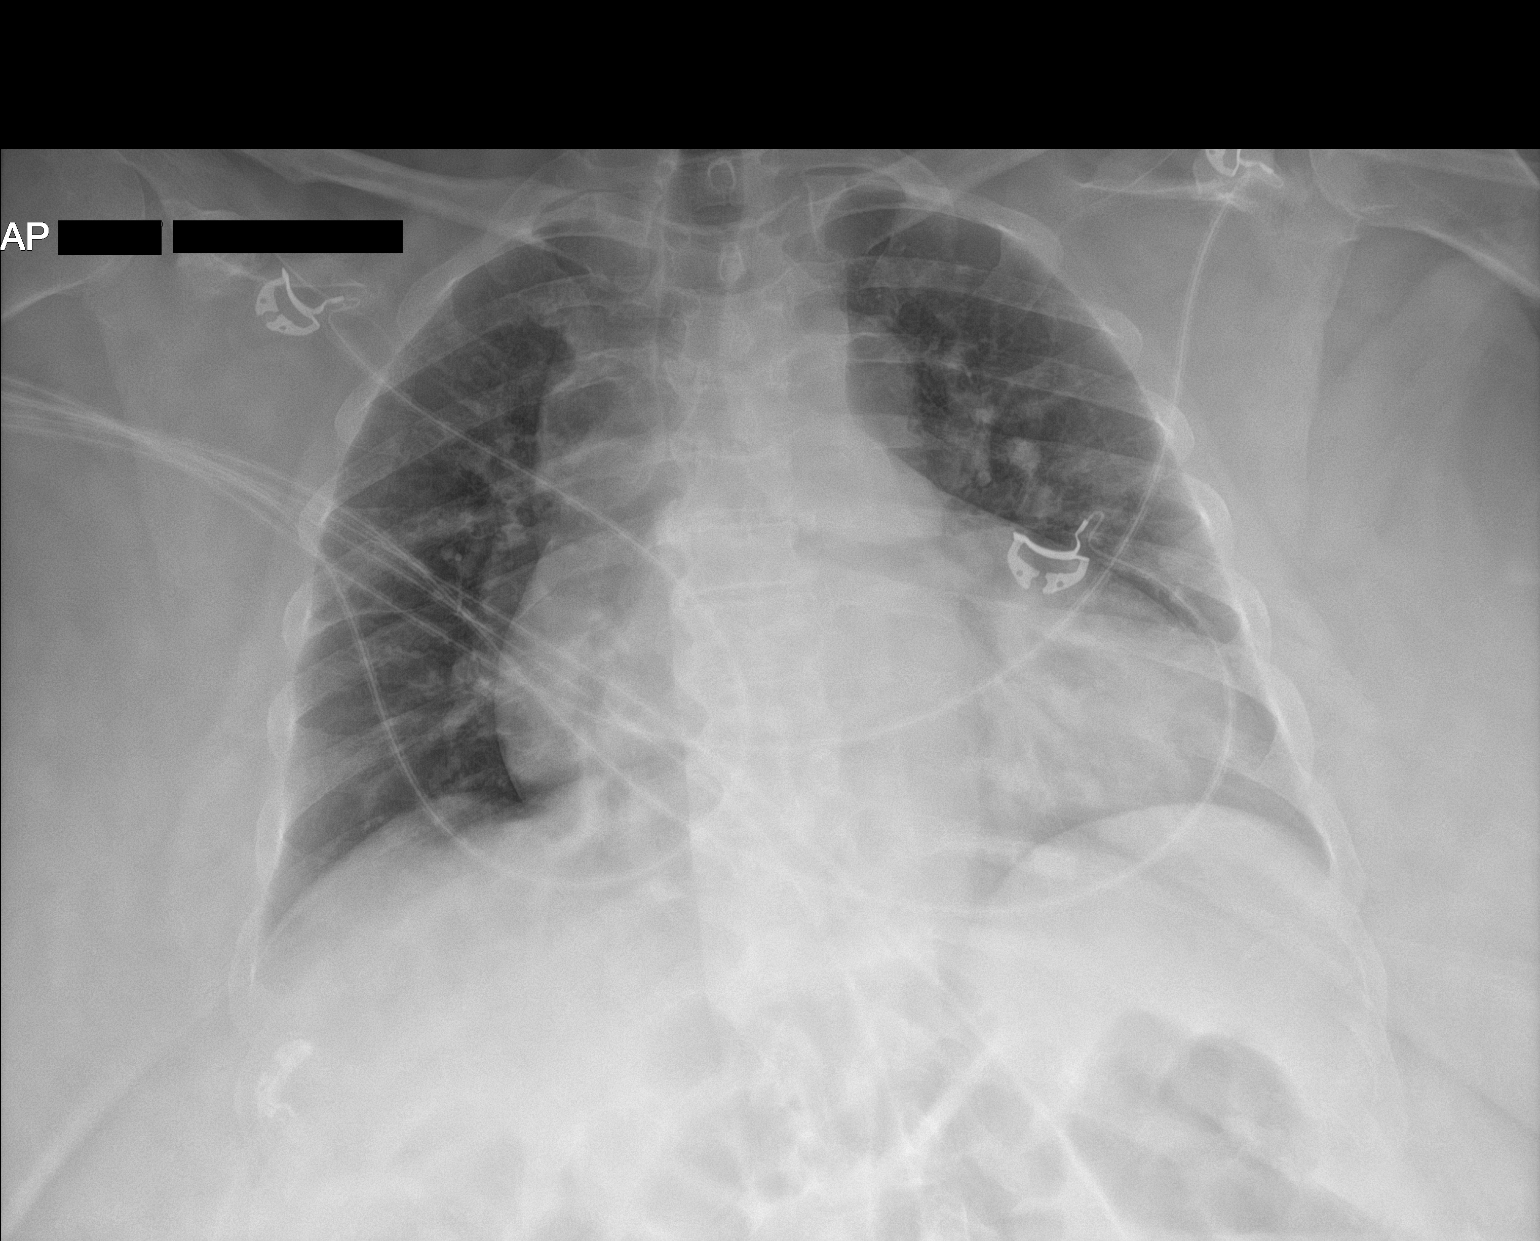

[1 of 1 positions shown; findings below may reference images not displayed]

FINDINGS: Portable AP semi upright view at 2525 hours. Stable cardiomegaly and
mediastinal contours. Visualized tracheal air column is within
normal limits. Allowing for portable technique the lungs are clear.
No pneumothorax. Negative visible bowel gas and osseous structures.
IMPRESSION: Cardiomegaly.  No acute cardiopulmonary abnormality.

## 2021-04-30 ENCOUNTER — Inpatient Hospital Stay (HOSPITAL_COMMUNITY)
Admission: EM | Admit: 2021-04-30 | Discharge: 2021-05-09 | DRG: 189 | Disposition: A | Discharge status: Home / Self Care | Payer: Medicaid Other | Attending: Family Medicine | Admitting: Family Medicine

## 2021-04-30 ENCOUNTER — Emergency Department (HOSPITAL_COMMUNITY): Payer: Medicaid Other

## 2021-04-30 ENCOUNTER — Encounter (HOSPITAL_COMMUNITY): Payer: Self-pay

## 2021-04-30 ENCOUNTER — Other Ambulatory Visit: Payer: Self-pay

## 2021-04-30 DIAGNOSIS — Z87891 Personal history of nicotine dependence: Secondary | ICD-10-CM

## 2021-04-30 DIAGNOSIS — Z91199 Patient's noncompliance with other medical treatment and regimen due to unspecified reason: Secondary | ICD-10-CM | POA: Diagnosis not present

## 2021-04-30 DIAGNOSIS — I272 Pulmonary hypertension, unspecified: Secondary | ICD-10-CM | POA: Diagnosis present

## 2021-04-30 DIAGNOSIS — Z8249 Family history of ischemic heart disease and other diseases of the circulatory system: Secondary | ICD-10-CM | POA: Diagnosis not present

## 2021-04-30 DIAGNOSIS — Z86711 Personal history of pulmonary embolism: Secondary | ICD-10-CM | POA: Diagnosis not present

## 2021-04-30 DIAGNOSIS — T501X6A Underdosing of loop [high-ceiling] diuretics, initial encounter: Secondary | ICD-10-CM | POA: Diagnosis not present

## 2021-04-30 DIAGNOSIS — I5031 Acute diastolic (congestive) heart failure: Secondary | ICD-10-CM | POA: Diagnosis not present

## 2021-04-30 DIAGNOSIS — Z20822 Contact with and (suspected) exposure to covid-19: Secondary | ICD-10-CM | POA: Diagnosis present

## 2021-04-30 DIAGNOSIS — I110 Hypertensive heart disease with heart failure: Secondary | ICD-10-CM | POA: Diagnosis not present

## 2021-04-30 DIAGNOSIS — I50810 Right heart failure, unspecified: Secondary | ICD-10-CM | POA: Diagnosis not present

## 2021-04-30 DIAGNOSIS — R0603 Acute respiratory distress: Secondary | ICD-10-CM | POA: Diagnosis not present

## 2021-04-30 DIAGNOSIS — I10 Essential (primary) hypertension: Secondary | ICD-10-CM | POA: Diagnosis present

## 2021-04-30 DIAGNOSIS — E66813 Obesity, class 3: Secondary | ICD-10-CM | POA: Diagnosis present

## 2021-04-30 DIAGNOSIS — J9601 Acute respiratory failure with hypoxia: Principal | ICD-10-CM | POA: Diagnosis present

## 2021-04-30 DIAGNOSIS — Z79899 Other long term (current) drug therapy: Secondary | ICD-10-CM

## 2021-04-30 DIAGNOSIS — J449 Chronic obstructive pulmonary disease, unspecified: Secondary | ICD-10-CM | POA: Diagnosis present

## 2021-04-30 DIAGNOSIS — R0602 Shortness of breath: Secondary | ICD-10-CM | POA: Diagnosis not present

## 2021-04-30 DIAGNOSIS — Z7901 Long term (current) use of anticoagulants: Secondary | ICD-10-CM | POA: Diagnosis not present

## 2021-04-30 DIAGNOSIS — J9602 Acute respiratory failure with hypercapnia: Secondary | ICD-10-CM | POA: Diagnosis present

## 2021-04-30 DIAGNOSIS — E662 Morbid (severe) obesity with alveolar hypoventilation: Secondary | ICD-10-CM | POA: Diagnosis not present

## 2021-04-30 DIAGNOSIS — Z86718 Personal history of other venous thrombosis and embolism: Secondary | ICD-10-CM

## 2021-04-30 DIAGNOSIS — R0600 Dyspnea, unspecified: Secondary | ICD-10-CM

## 2021-04-30 DIAGNOSIS — Z6841 Body Mass Index (BMI) 40.0 and over, adult: Secondary | ICD-10-CM | POA: Diagnosis present

## 2021-04-30 LAB — CBC WITH DIFFERENTIAL/PLATELET
Abs Immature Granulocytes: 0.07 10*3/uL (ref 0.00–0.07)
Basophils Absolute: 0.1 10*3/uL (ref 0.0–0.1)
Basophils Relative: 1 %
Eosinophils Absolute: 0.1 10*3/uL (ref 0.0–0.5)
Eosinophils Relative: 1 %
HCT: 40.1 % (ref 36.0–46.0)
Hemoglobin: 11.8 g/dL — ABNORMAL LOW (ref 12.0–15.0)
Immature Granulocytes: 1 %
Lymphocytes Relative: 15 %
Lymphs Abs: 0.9 10*3/uL (ref 0.7–4.0)
MCH: 29.2 pg (ref 26.0–34.0)
MCHC: 29.4 g/dL — ABNORMAL LOW (ref 30.0–36.0)
MCV: 99.3 fL (ref 80.0–100.0)
Monocytes Absolute: 0.4 10*3/uL (ref 0.1–1.0)
Monocytes Relative: 7 %
Neutro Abs: 4.6 10*3/uL (ref 1.7–7.7)
Neutrophils Relative %: 75 %
Platelets: 208 10*3/uL (ref 150–400)
RBC: 4.04 MIL/uL (ref 3.87–5.11)
RDW: 15 % (ref 11.5–15.5)
WBC: 6.1 10*3/uL (ref 4.0–10.5)
nRBC: 2.5 % — ABNORMAL HIGH (ref 0.0–0.2)

## 2021-04-30 LAB — COMPREHENSIVE METABOLIC PANEL
ALT: 9 U/L (ref 0–44)
AST: 17 U/L (ref 15–41)
Albumin: 3.3 g/dL — ABNORMAL LOW (ref 3.5–5.0)
Alkaline Phosphatase: 62 U/L (ref 38–126)
Anion gap: 6 (ref 5–15)
BUN: 12 mg/dL (ref 6–20)
CO2: 30 mmol/L (ref 22–32)
Calcium: 8.9 mg/dL (ref 8.9–10.3)
Chloride: 103 mmol/L (ref 98–111)
Creatinine, Ser: 0.93 mg/dL (ref 0.44–1.00)
GFR, Estimated: >60 mL/min (ref >60)
Glucose, Bld: 88 mg/dL (ref 70–99)
Potassium: 4.3 mmol/L (ref 3.5–5.1)
Sodium: 139 mmol/L (ref 135–145)
Total Bilirubin: 1 mg/dL (ref 0.3–1.2)
Total Protein: 7.4 g/dL (ref 6.5–8.1)

## 2021-04-30 LAB — RESP PANEL BY RT-PCR (FLU A&B, COVID) ARPGX2
Influenza A by PCR: NEGATIVE
Influenza B by PCR: NEGATIVE
SARS Coronavirus 2 by RT PCR: NEGATIVE

## 2021-04-30 LAB — BRAIN NATRIURETIC PEPTIDE: B Natriuretic Peptide: 85.2 pg/mL (ref 0.0–100.0)

## 2021-04-30 MED ORDER — ALBUTEROL SULFATE HFA 108 (90 BASE) MCG/ACT IN AERS
2.0000 | INHALATION_SPRAY | RESPIRATORY_TRACT | Status: DC | PRN
  Administered 2021-04-30: 2 via RESPIRATORY_TRACT
  Filled 2021-04-30: qty 6.7

## 2021-04-30 MED ORDER — FUROSEMIDE 10 MG/ML IJ SOLN
40.0000 mg | Freq: Once | INTRAMUSCULAR | Status: AC
  Administered 2021-05-01: 40 mg via INTRAVENOUS
  Filled 2021-04-30: qty 4

## 2021-04-30 MED ORDER — NITROGLYCERIN 2 % TD OINT
1.0000 [in_us] | TOPICAL_OINTMENT | Freq: Once | TRANSDERMAL | Status: AC
  Administered 2021-04-30: 1 [in_us] via TOPICAL
  Filled 2021-04-30: qty 1

## 2021-04-30 MED ORDER — DEXAMETHASONE SODIUM PHOSPHATE 10 MG/ML IJ SOLN
10.0000 mg | Freq: Once | INTRAMUSCULAR | Status: AC
  Administered 2021-04-30: 10 mg via INTRAVENOUS
  Filled 2021-04-30: qty 1

## 2021-04-30 NOTE — ED Notes (Signed)
Pt's IV blew while she was in CT, staff was unable to gain another access, returned to the room & IV team consulted.

## 2021-04-30 NOTE — ED Triage Notes (Signed)
Pt came in POV from home d/t worsening SOB over the past week. Pt reports hx of blood clots. A/Ox4, verbal- able to make needs known. 100% O2 NRB.

## 2021-04-30 NOTE — Assessment & Plan Note (Addendum)
-  BIPAP QHS - wean to [place] and keep sats 88-90%. -daily weights/strict I/Os -down >10L -change IV lasix to PO -daily labs

## 2021-04-30 NOTE — ED Notes (Signed)
IV team at bedside 

## 2021-04-30 NOTE — Assessment & Plan Note (Addendum)
Hx of recurrent PE.  - states she is compliant with her eliquis. - CTA negative for PE

## 2021-04-30 NOTE — H&P (Signed)
History and Physical    Angel Brewer PZW:[id number] DOB: 09/06/1965 DOA: 04/30/2021  PCP: [name], NP   Patient coming from: Home  I have personally briefly reviewed patient's old medical records in [place]  CC: SOB for 1.5 weeks HPI: 55 year old African-American female with a history of hypertension, history of recurrent PE on Eliquis, pulmonary hypertension, super morbid obesity with a BMI of 63 presents to the ER today with a 1.5-week history of worsening shortness of breath.  Patient states that she has not been taking her Lasix daily as prescribed.  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  She has been short of breath for the last week and a half.  She states that she normally has chronic dyspnea but sometimes she has good days and bad days.  Over the last week and a half, she has noticed her breathing has gotten steadily worse.  She has noticed increasing lower extremity edema.  Her edema is making her thighs quite hard.  She denies any fever or chills.  On presentation to the ER, her room air saturations were reportedly 49%.  She was brought back and placed immediately on nonrebreather.  She was then placed on BiPAP due to her dyspnea.  Laboratory evaluation was surprisingly unremarkable.  BNP was normal at 85.  COVID test was negative.  Patient is difficult with IV access.  CTPA still pending to rule out pulmonary embolism.  She was given an inch of Nitropaste due to her hypertension and shortness of breath.  She remains on a nonrebreather.  ABG is pending.  Due to the patient's respiratory failure, [facility] contacted for admission.   ED Course: noted to RA sats of 49% in triage. Placed on NRB and then bipap.  Review of Systems:  Review of Systems  Constitutional:  Positive for malaise/fatigue. Negative for chills and fever.  HENT: Negative.     Eyes: Negative.   Respiratory:  Positive for shortness of breath.   Cardiovascular:  Positive for orthopnea, leg swelling and PND.  Gastrointestinal: Negative.  Negative for heartburn, nausea and vomiting.  Genitourinary: Negative.  Negative for dysuria, frequency and urgency.  Musculoskeletal:  Positive for back pain. Negative for myalgias and neck pain.  Skin: Negative.   Neurological: Negative.   Endo/Heme/Allergies: Negative.   Psychiatric/Behavioral: Negative.    All other systems reviewed and are negative.  Past Medical History:  Diagnosis Date   DVT (deep vein thrombosis) in pregnancy    RLE DVT 02/2016   Dyspnea    Morbid obesity (HCC)    PE (pulmonary embolism) 02/29/2016   Sleep apnea    uses a cpap    Past Surgical History:  Procedure Laterality Date   CESAREAN SECTION     x 3   OPEN REDUCTION INTERNAL FIXATION (ORIF) DISTAL RADIAL FRACTURE Right 06/20/2019   Procedure: OPEN REDUCTION INTERNAL FIXATION (ORIF) DISTAL RADIAL FRACTURE;  Surgeon: [name], MD;  Location: [place];  Service: Orthopedics;  Laterality: Right;  REGIONAL BLOCK TOTAL SURGERY TIME: 90 MINUTES   SYNOVECTOMY Right 11/21/2019   Procedure: RIGHT WRIST LIGAMENT RECONSTRUCTION;  Surgeon: [name], MD;  Location: [place];  Service: Orthopedics;  Laterality: Right;  PROCEDURE: RIGHT WRIST LIGAMENT RECONSTRUCTION LENGTH OF SURGERY: 105 MIN     reports that she has quit smoking. She has never used smokeless tobacco. She reports that  she does not drink alcohol and does not use drugs.  No Known Allergies  Family History  Problem Relation Age of Onset   Hypertension Sister     Prior to Admission medications   Medication Sig Start Date End Date Taking? Authorizing Provider  acetaminophen (TYLENOL) 650 MG CR tablet Take 1,300 mg by mouth as needed for pain.   Yes [provider]  albuterol (VENTOLIN HFA) 108 (90 Base) MCG/ACT inhaler Inhale 2 puffs into the lungs every 4 (four) hours  as needed for wheezing or shortness of breath. 02/05/21  Yes [name], NP  amLODipine (NORVASC) 5 MG tablet Take 1 tablet (5 mg total) by mouth daily. 02/05/21 05/06/21 Yes [name], NP  apixaban (ELIQUIS) 5 MG TABS tablet Take 1 tablet (5 mg total) by mouth 2 (two) times daily. 02/05/21 05/06/21 Yes [name], NP  atorvastatin (LIPITOR) 20 MG tablet Take 1 tablet (20 mg total) by mouth daily. 09/12/20  Yes [name], NP  furosemide (LASIX) 40 MG tablet Take 1 tablet (40 mg total) by mouth daily. 02/05/21 05/06/21 Yes [name], NP  PRESCRIPTION MEDICATION 1 each by Other route at bedtime. CPAP- At bedtime   Yes [provider]  Blood Pressure Monitor DEVI Please provide with medically approved xl blood pressure monitoring device. ICD I10.0 02/05/21   [name], NP  Misc. Devices MISC Please provide insurance approved CPAP nasal mask. ICD 10 G47.33 Z99.89 02/05/21   [name], NP    Physical Exam: Vitals:   04/30/21 2225 04/30/21 2226 04/30/21 2227 04/30/21 2230  BP: (!) 158/88   (!) 159/95  Pulse: 90 84 86 86  Resp: 20 16 (!) 26 (!) 27  Temp:      TempSrc:      SpO2: 96% 98% 95% 99%  Weight:      Height:        Physical Exam Vitals and nursing note reviewed.  Constitutional:      Appearance: She is obese. She is ill-appearing. She is not diaphoretic.     Comments: Appears chronically ill. Wearing NRB mask  HENT:     Head: Normocephalic and atraumatic.     Nose: Nose normal. No rhinorrhea.  Eyes:     General:        Right eye: No discharge.        Left eye: No discharge.  Cardiovascular:     Rate and Rhythm: Normal rate and regular rhythm.  Pulmonary:     Effort: Respiratory distress present.     Breath sounds: No wheezing.     Comments: Difficult to auscultate BS due to body habitus Abdominal:     General: Abdomen is protuberant. There is no distension.     Tenderness: There is no abdominal tenderness. There is no  guarding.  Musculoskeletal:     Comments: +2-3 pitting thigh edema. Skin is hard and woody.  +1 pitting pretibial edema Trace to +1 pedal edema  Neurological:     General: No focal deficit present.     Mental Status: She is alert and oriented to person, place, and time.     Labs on Admission: I have personally reviewed following labs and imaging studies  CBC: Recent Labs  Lab 04/30/21 1821  WBC 6.1  NEUTROABS 4.6  HGB 11.8*  HCT 40.1  MCV 99.3  PLT 784   Basic Metabolic Panel: Recent Labs  Lab 04/30/21 1821  NA 139  K 4.3  CL 103  CO2 30  GLUCOSE 88  BUN 12  CREATININE 0.93  CALCIUM 8.9   GFR: Estimated Creatinine Clearance: 122.6 mL/min (by C-G formula based on SCr of 0.93 mg/dL). Liver Function Tests: Recent Labs  Lab 04/30/21 1821  AST 17  ALT 9  ALKPHOS 62  BILITOT 1.0  PROT 7.4  ALBUMIN 3.3*   No results for input(s): LIPASE, AMYLASE in the last 168 hours. No results for input(s): AMMONIA in the last 168 hours. Coagulation Profile: No results for input(s): INR, PROTIME in the last 168 hours. Cardiac Enzymes: No results for input(s): CKTOTAL, CKMB, CKMBINDEX, TROPONINI in the last 168 hours. BNP (last 3 results) No results for input(s): PROBNP in the last 8760 hours. HbA1C: No results for input(s): HGBA1C in the last 72 hours. CBG: No results for input(s): GLUCAP in the last 168 hours. Lipid Profile: No results for input(s): CHOL, HDL, LDLCALC, TRIG, CHOLHDL, LDLDIRECT in the last 72 hours. Thyroid Function Tests: No results for input(s): TSH, T4TOTAL, FREET4, T3FREE, THYROIDAB in the last 72 hours. Anemia Panel: No results for input(s): VITAMINB12, FOLATE, FERRITIN, TIBC, IRON, RETICCTPCT in the last 72 hours. Urine analysis:    Component Value Date/Time   COLORURINE AMBER (A) 11/23/2019 0220   APPEARANCEUR CLOUDY (A) 11/23/2019 0220   LABSPEC 1.027 11/23/2019 0220   PHURINE 5.0 11/23/2019 0220   GLUCOSEU NEGATIVE 11/23/2019 0220    HGBUR NEGATIVE 11/23/2019 0220   BILIRUBINUR NEGATIVE 11/23/2019 0220   KETONESUR NEGATIVE 11/23/2019 0220   PROTEINUR 100 (A) 11/23/2019 0220   NITRITE NEGATIVE 11/23/2019 0220   LEUKOCYTESUR NEGATIVE 11/23/2019 0220    Radiological Exams on Admission: I have personally reviewed images DG Chest Portable 1 View  Result Date: 04/30/2021 CLINICAL DATA:  Short of breath EXAM: PORTABLE CHEST 1 VIEW COMPARISON:  12/27/2020 FINDINGS: Stable enlarged cardiac silhouette. There is increase in central venous congestion. Additionally, there is bilateral ground-glass opacities in the lower lobe new from prior. Upper lungs are clear. IMPRESSION: Increased central venous congestion and lower lobe airspace opacities suggest congestive heart failure with pulmonary edema. Electronically Signed   By: Suzy Bouchard M.D.   On: 04/30/2021 18:39    EKG: I have personally reviewed EKG: NSR    Assessment/Plan Principal Problem:   Acute respiratory failure with hypoxia (HCC) Active Problems:   Essential hypertension   Chronic anticoagulation   Pulmonary hypertension (HCC)    Acute respiratory failure with hypoxia ([place]) Admit to progressive care. Continue with NRB vs Bipap. CTPA pending to rule out PE.  Pt has been non-compliant with taking her lasix daily. CXR is useless given her body habitus. Will need CTA to evaluate her hypoxia. BNP normal but given her LE edema and hx of pulmonary HTN, she could be having right sided heart failure. Her surface echo was difficult and had poor windows due to body habitus. Maybe a TEE may be better study of her LV/RV function given her morbid obesity.  Also consideration for RHC given her size to evaluate for pulmonary HTN.  Essential hypertension Pt admits to non-compliance with her lasix. 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  Chronic anticoagulation Hx of recurrent PE. Pt states she is  compliant with her eliquis. CTPA pending.  Pulmonary hypertension (HCC) Pt had pulmonary HTN on prior echo but had poor echo windows. May need cardiology consult to consider TEE or RHC to better evaluate the  severity of her pulmonary HTN. Pt also with hx of OSA that is untreated.  DVT prophylaxis: Eliquis Code Status: Full Code Family Communication: no family at bedside  Disposition Plan: return home  Consults called: none  Admission status: Inpatient, Step Down Unit   Kristopher Oppenheim, DO [facility] 04/30/2021, 11:38 PM

## 2021-04-30 NOTE — ED Notes (Signed)
Patient transported to CT

## 2021-04-30 NOTE — Subjective & Objective (Signed)
CC: SOB for 1.5 weeks HPI: 55 year old African-American female with a history of hypertension, history of recurrent PE on Eliquis, pulmonary hypertension, super morbid obesity with a BMI of 63 presents to the ER today with a 1.5-week history of worsening shortness of breath.  Patient states that she has not been taking her Lasix daily as prescribed.  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  She has been short of breath for the last week and a half.  She states that she normally has chronic dyspnea but sometimes she has good days and bad days.  Over the last week and a half, she has noticed her breathing has gotten steadily worse.  She has noticed increasing lower extremity edema.  Her edema is making her thighs quite hard.  She denies any fever or chills.  On presentation to the ER, her room air saturations were reportedly 49%.  She was brought back and placed immediately on nonrebreather.  She was then placed on BiPAP due to her dyspnea.  Laboratory evaluation was surprisingly unremarkable.  BNP was normal at 85.  COVID test was negative.  Patient is difficult with IV access.  CTPA still pending to rule out pulmonary embolism.  She was given an inch of Nitropaste due to her hypertension and shortness of breath.  She remains on a nonrebreather.  ABG is pending.  Due to the patient's respiratory failure, [facility] contacted for admission.

## 2021-04-30 NOTE — Assessment & Plan Note (Addendum)
-  non-compliance with her lasix. 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

## 2021-04-30 NOTE — ED Notes (Signed)
Pt sleeping & was placed on NRB mask d/t decreased O2 while sleeping (does have history of sleep apnea).

## 2021-04-30 NOTE — ED Notes (Signed)
EDP at bedside with this RN & she was taken off Bipap & put on 2L n/c,pt is tolerating well.

## 2021-04-30 NOTE — ED Provider Notes (Addendum)
[facility] EMERGENCY DEPARTMENT Provider Note   CSN: [id number] Arrival date & time: 04/30/21  1804     History Chief Complaint  Patient presents with   Shortness of Breath    Angel Brewer is a 55 y.o. female with past medical history DVT x2, pulmonary embolism x2, obstructive sleep apnea, morbid obesity who presents with worsening shortness of breath over the last week and a half, with acute worsening over the last 2 days.  Patient reports that she has not had any other symptoms, no runny nose, cough, fever, nausea, vomiting, diaphoresis.  Patient denies any abdominal pain, dysuria.  Patient reports that she has been compliant with her anticoagulation medication.  Patient reports that she does not take anything for shortness of breath, has never been treated for asthma in the past.  Patient reports history of smoking.  Patient does report some mild chest pain secondary to respiratory exertion, that is resolved once patient's breath is returned on evaluation.  Patient presented from triage with a oxygen saturation of 49%, was placed on high flow mask ventilation, and her first oxygen saturation on reevaluation was 100%.   Shortness of Breath     Past Medical History:  Diagnosis Date   DVT (deep vein thrombosis) in pregnancy    RLE DVT 02/2016   Dyspnea    Morbid obesity (HCC)    PE (pulmonary embolism) 02/29/2016   Sleep apnea    uses a cpap    Patient Active Problem List   Diagnosis Date Noted   Pulmonary emboli ([place]) 12/27/2020   Respiratory failure ([place]) 08/19/2020   Pulmonary hypertension ([place]) 08/18/2020   OSA (obstructive sleep apnea) 08/18/2020   Chronic deep vein thrombosis (DVT) of femoral vein of left lower extremity ([place]) 02/09/2020   Chronic anticoagulation 02/09/2020   Elevated blood pressure reading 02/09/2020   Right wrist fracture 06/14/2019   Chronic respiratory failure with hypoxia ([place]) 06/13/2019   Essential hypertension 08/25/2017    OSA on CPAP 02/03/2017   Morbid obesity ([place]) 10/29/2016   Vitamin D deficiency 10/29/2016    Past Surgical History:  Procedure Laterality Date   CESAREAN SECTION     x 3   OPEN REDUCTION INTERNAL FIXATION (ORIF) DISTAL RADIAL FRACTURE Right 06/20/2019   Procedure: OPEN REDUCTION INTERNAL FIXATION (ORIF) DISTAL RADIAL FRACTURE;  Surgeon: [name], MD;  Location: [facility];  Service: Orthopedics;  Laterality: Right;  REGIONAL BLOCK TOTAL SURGERY TIME: 90 MINUTES   SYNOVECTOMY Right 11/21/2019   Procedure: RIGHT WRIST LIGAMENT RECONSTRUCTION;  Surgeon: [name], MD;  Location: [facility];  Service: Orthopedics;  Laterality: Right;  PROCEDURE: RIGHT WRIST LIGAMENT RECONSTRUCTION LENGTH OF SURGERY: 105 MIN     OB History   No obstetric history on file.     Family History  Problem Relation Age of Onset   Hypertension Sister     Social History   Tobacco Use   Smoking status: Former   Smokeless tobacco: Never   Tobacco comments:    smoked only as a teenager  Media planner   Vaping Use: Never used  Substance Use Topics   Alcohol use: No   Drug use: No    Home Medications Prior to Admission medications   Medication Sig Start Date End Date Taking? Authorizing Provider  acetaminophen (TYLENOL) 650 MG CR tablet Take 1,300 mg by mouth as needed for pain.    [provider]  albuterol (VENTOLIN HFA) 108 (90 Base) MCG/ACT inhaler Inhale 2 puffs into the lungs  every 4 (four) hours as needed for wheezing or shortness of breath. 02/05/21   [name], NP  amLODipine (NORVASC) 5 MG tablet Take 1 tablet (5 mg total) by mouth daily. 02/05/21 05/06/21  [name], NP  apixaban (ELIQUIS) 5 MG TABS tablet Take 1 tablet (5 mg total) by mouth 2 (two) times daily. 02/05/21 05/06/21  [name], NP  atorvastatin (LIPITOR) 20 MG tablet Take 1 tablet (20 mg total) by mouth daily. 09/12/20   [name], NP  Blood Pressure Monitor DEVI Please provide with medically  approved xl blood pressure monitoring device. ICD I10.0 02/05/21   [name], NP  furosemide (LASIX) 40 MG tablet Take 1 tablet (40 mg total) by mouth daily. 02/05/21 05/06/21  [name], NP  Misc. Devices MISC Please provide insurance approved CPAP nasal mask. ICD 10 G47.33 Z99.89 02/05/21   [name], NP  PRESCRIPTION MEDICATION CPAP- At bedtime    [provider]    Allergies    Patient has no known allergies.  Review of Systems   Review of Systems  Respiratory:  Positive for shortness of breath.   All other systems reviewed and are negative.  Physical Exam Updated Vital Signs BP (!) 156/88   Pulse 86   Temp 98 F (36.7 C) (Oral)   Resp 18   Ht _0  (1.727 m)   Wt (!) 188.2 kg   LMP 11/29/2015   SpO2 100%   BMI 63.10 kg/m   Physical Exam Vitals and nursing note reviewed.  Constitutional:      General: She is in acute distress.     Appearance: Normal appearance. She is obese. She is ill-appearing.     Comments: Overweight patient sitting on exam table in acute respiratory distress  HENT:     Head: Normocephalic and atraumatic.  Eyes:     General:        Right eye: No discharge.        Left eye: No discharge.  Cardiovascular:     Rate and Rhythm: Normal rate and regular rhythm.     Heart sounds: No murmur heard.   No friction rub. No gallop.  Pulmonary:     Effort: Tachypnea and respiratory distress present.     Breath sounds: Decreased breath sounds present.     Comments: Decreased breath sounds throughout all lung fields, some scattered wheezing, poor inspiratory effort, tachypnea Abdominal:     General: Bowel sounds are normal.     Palpations: Abdomen is soft.  Musculoskeletal:     Right lower leg: Edema present.     Left lower leg: Edema present.  Skin:    General: Skin is warm and dry.     Capillary Refill: Capillary refill takes less than 2 seconds.  Neurological:     Mental Status: She is alert and oriented to person,  place, and time.  Psychiatric:        Mood and Affect: Mood normal.        Behavior: Behavior normal.    ED Results / Procedures / Treatments   Labs (all labs ordered are listed, but only abnormal results are displayed) Labs Reviewed  RESP PANEL BY RT-PCR (FLU A&B, COVID) [id number]  COMPREHENSIVE METABOLIC PANEL  CBC WITH DIFFERENTIAL/PLATELET  BRAIN NATRIURETIC PEPTIDE    EKG None  Radiology DG Chest Portable 1 View  Result Date: 04/30/2021 CLINICAL DATA:  Short of breath EXAM: PORTABLE CHEST 1 VIEW COMPARISON:  12/27/2020  FINDINGS: Stable enlarged cardiac silhouette. There is increase in central venous congestion. Additionally, there is bilateral ground-glass opacities in the lower lobe new from prior. Upper lungs are clear. IMPRESSION: Increased central venous congestion and lower lobe airspace opacities suggest congestive heart failure with pulmonary edema. Electronically Signed   By: Suzy Bouchard M.D.   On: 04/30/2021 18:39    Procedures Procedures   Medications Ordered in ED Medications  albuterol (VENTOLIN HFA) 108 (90 Base) MCG/ACT inhaler 2 puff (has no administration in time range)  nitroGLYCERIN (NITROGLYN) 2 % ointment 1 inch (has no administration in time range)    ED Course  I have reviewed the triage vital signs and the nursing notes.  Pertinent labs & imaging results that were available during my care of the patient were reviewed by me and considered in my medical decision making (see chart for details).    MDM Rules/Calculators/A&P                         Patient presented from triage with a oxygen saturation of 49%, was placed on high flow mask ventilation, and her first oxygen saturation on reevaluation was 100%.  Patient was able to stabilize to 10 L by bag mask, still saturating in the high 90s during my evaluation.  Chest x-ray was obtained which is significant for signs of heart failure, pulmonary edema, no evidence of pneumonia.  We will begin  patient on BiPAP, nitroglycerin paste on the chest.  Given strong history of DVT, PE despite anticoagulation, we will obtain CT of the chest to rule out PE, as well as basic labs, BNP.  EKG without abnormality.  6:57 PM Care of [facility]ride transferred to Dr. Vanita Panda at the end of my shift as the patient will require reassessment once labs/imaging have resulted. Patient presentation, ED course, and plan of care discussed with review of all pertinent labs and imaging. Please see his/her note for further details regarding further ED course and disposition. Plan at time of handoff is admitted for acute hypoxic respiratory failure, heart failure, and pending results of CTA, and other lab work-up. This may be altered or completely changed at the discretion of the oncoming team pending results of further workup.  Final Clinical Impression(s) / ED Diagnoses Final diagnoses:  None    Rx / DC Orders ED Discharge Orders     None        Dorien Chihuahua 04/30/21 1857    Carmin Muskrat, MD 04/30/21 2351    Carlus Pavlov, [place] H, PA-C 05/10/21 1418    Carmin Muskrat, MD 05/10/21 1727

## 2021-04-30 NOTE — Assessment & Plan Note (Addendum)
Pt had pulmonary HTN on prior echo but had poor echo windows.  -echo: There is normal pulmonary artery systolic pressure, EF normal

## 2021-05-01 ENCOUNTER — Inpatient Hospital Stay (HOSPITAL_COMMUNITY): Payer: Medicaid Other

## 2021-05-01 ENCOUNTER — Inpatient Hospital Stay: Payer: Self-pay

## 2021-05-01 DIAGNOSIS — J9602 Acute respiratory failure with hypercapnia: Secondary | ICD-10-CM | POA: Diagnosis not present

## 2021-05-01 DIAGNOSIS — J9601 Acute respiratory failure with hypoxia: Secondary | ICD-10-CM | POA: Diagnosis not present

## 2021-05-01 DIAGNOSIS — I517 Cardiomegaly: Secondary | ICD-10-CM | POA: Diagnosis not present

## 2021-05-01 LAB — CBC WITH DIFFERENTIAL/PLATELET
Abs Immature Granulocytes: 0.1 10*3/uL — ABNORMAL HIGH (ref 0.00–0.07)
Basophils Absolute: 0 10*3/uL (ref 0.0–0.1)
Basophils Relative: 1 %
Eosinophils Absolute: 0 10*3/uL (ref 0.0–0.5)
Eosinophils Relative: 0 %
HCT: 43.6 % (ref 36.0–46.0)
Hemoglobin: 12.6 g/dL (ref 12.0–15.0)
Immature Granulocytes: 1 %
Lymphocytes Relative: 7 %
Lymphs Abs: 0.6 10*3/uL — ABNORMAL LOW (ref 0.7–4.0)
MCH: 29.4 pg (ref 26.0–34.0)
MCHC: 28.9 g/dL — ABNORMAL LOW (ref 30.0–36.0)
MCV: 101.6 fL — ABNORMAL HIGH (ref 80.0–100.0)
Monocytes Absolute: 0.3 10*3/uL (ref 0.1–1.0)
Monocytes Relative: 3 %
Neutro Abs: 7.2 10*3/uL (ref 1.7–7.7)
Neutrophils Relative %: 88 %
Platelets: 175 10*3/uL (ref 150–400)
RBC: 4.29 MIL/uL (ref 3.87–5.11)
RDW: 14.8 % (ref 11.5–15.5)
WBC: 8.1 10*3/uL (ref 4.0–10.5)
nRBC: 1.4 % — ABNORMAL HIGH (ref 0.0–0.2)

## 2021-05-01 LAB — MAGNESIUM: Magnesium: 2 mg/dL (ref 1.7–2.4)

## 2021-05-01 LAB — I-STAT VENOUS BLOOD GAS, ED
Acid-base deficit: 4 mmol/L — ABNORMAL HIGH (ref 0.0–2.0)
Bicarbonate: 26.4 mmol/L (ref 20.0–28.0)
Calcium, Ion: 1.03 mmol/L — ABNORMAL LOW (ref 1.15–1.40)
HCT: 33 % — ABNORMAL LOW (ref 36.0–46.0)
Hemoglobin: 11.2 g/dL — ABNORMAL LOW (ref 12.0–15.0)
O2 Saturation: 49 %
Potassium: 3.2 mmol/L — ABNORMAL LOW (ref 3.5–5.1)
Sodium: 146 mmol/L — ABNORMAL HIGH (ref 135–145)
TCO2: 29 mmol/L (ref 22–32)
pCO2, Ven: 78.7 mmHg (ref 44.0–60.0)
pH, Ven: 7.133 — CL (ref 7.250–7.430)
pO2, Ven: 36 mmHg (ref 32.0–45.0)

## 2021-05-01 LAB — I-STAT ARTERIAL BLOOD GAS, ED
Acid-Base Excess: 6 mmol/L — ABNORMAL HIGH (ref 0.0–2.0)
Bicarbonate: 35.3 mmol/L — ABNORMAL HIGH (ref 20.0–28.0)
Calcium, Ion: 1.17 mmol/L (ref 1.15–1.40)
HCT: 37 % (ref 36.0–46.0)
Hemoglobin: 12.6 g/dL (ref 12.0–15.0)
O2 Saturation: 94 %
Potassium: 4.6 mmol/L (ref 3.5–5.1)
Sodium: 138 mmol/L (ref 135–145)
TCO2: 37 mmol/L — ABNORMAL HIGH (ref 22–32)
pCO2 arterial: 72.6 mmHg (ref 32.0–48.0)
pH, Arterial: 7.295 — ABNORMAL LOW (ref 7.350–7.450)
pO2, Arterial: 82 mmHg — ABNORMAL LOW (ref 83.0–108.0)

## 2021-05-01 LAB — COMPREHENSIVE METABOLIC PANEL WITH GFR
ALT: 11 U/L (ref 0–44)
AST: 16 U/L (ref 15–41)
Albumin: 3.5 g/dL (ref 3.5–5.0)
Alkaline Phosphatase: 67 U/L (ref 38–126)
Anion gap: 10 (ref 5–15)
BUN: 12 mg/dL (ref 6–20)
CO2: 28 mmol/L (ref 22–32)
Calcium: 9 mg/dL (ref 8.9–10.3)
Chloride: 100 mmol/L (ref 98–111)
Creatinine, Ser: 0.99 mg/dL (ref 0.44–1.00)
GFR, Estimated: >60 mL/min
Glucose, Bld: 95 mg/dL (ref 70–99)
Potassium: 4.5 mmol/L (ref 3.5–5.1)
Sodium: 138 mmol/L (ref 135–145)
Total Bilirubin: 0.8 mg/dL (ref 0.3–1.2)
Total Protein: 7.8 g/dL (ref 6.5–8.1)

## 2021-05-01 LAB — BLOOD GAS, ARTERIAL
Acid-Base Excess: 9.5 mmol/L — ABNORMAL HIGH (ref 0.0–2.0)
Bicarbonate: 36.6 mmol/L — ABNORMAL HIGH (ref 20.0–28.0)
Drawn by: 51133
FIO2: 40
O2 Saturation: 92.7 %
Patient temperature: 37.1
pCO2 arterial: 86.6 mmHg (ref 32.0–48.0)
pH, Arterial: 7.25 — ABNORMAL LOW (ref 7.350–7.450)
pO2, Arterial: 72.1 mmHg — ABNORMAL LOW (ref 83.0–108.0)

## 2021-05-01 LAB — PROCALCITONIN: Procalcitonin: <0.1 ng/mL

## 2021-05-01 MED ORDER — AMLODIPINE BESYLATE 5 MG PO TABS
5.0000 mg | ORAL_TABLET | Freq: Every day | ORAL | Status: DC
  Administered 2021-05-01: 5 mg via ORAL
  Filled 2021-05-01: qty 1

## 2021-05-01 MED ORDER — IOHEXOL 350 MG/ML SOLN
75.0000 mL | Freq: Once | INTRAVENOUS | Status: AC | PRN
  Administered 2021-05-01: 75 mL via INTRAVENOUS

## 2021-05-01 MED ORDER — ACETAMINOPHEN 650 MG RE SUPP: 650.0000 mg | Freq: Four times a day (QID) | RECTAL | Status: DC | PRN

## 2021-05-01 MED ORDER — ALBUTEROL SULFATE (2.5 MG/3ML) 0.083% IN NEBU: 2.5000 mg | INHALATION_SOLUTION | RESPIRATORY_TRACT | Status: DC | PRN

## 2021-05-01 MED ORDER — APIXABAN 5 MG PO TABS
5.0000 mg | ORAL_TABLET | Freq: Two times a day (BID) | ORAL | Status: DC
  Administered 2021-05-01 – 2021-05-08 (×15): 5 mg via ORAL
  Filled 2021-05-01 (×16): qty 1

## 2021-05-01 MED ORDER — FUROSEMIDE 10 MG/ML IJ SOLN
40.0000 mg | Freq: Two times a day (BID) | INTRAMUSCULAR | Status: DC
  Administered 2021-05-01 – 2021-05-04 (×6): 40 mg via INTRAVENOUS
  Filled 2021-05-01 (×6): qty 4

## 2021-05-01 MED ORDER — ACETAMINOPHEN 325 MG PO TABS: 650.0000 mg | ORAL_TABLET | Freq: Four times a day (QID) | ORAL | Status: DC | PRN

## 2021-05-01 MED ORDER — ONDANSETRON HCL 4 MG/2ML IJ SOLN: 4.0000 mg | Freq: Four times a day (QID) | INTRAMUSCULAR | Status: DC | PRN

## 2021-05-01 MED ORDER — ONDANSETRON HCL 4 MG PO TABS: 4.0000 mg | ORAL_TABLET | Freq: Four times a day (QID) | ORAL | Status: DC | PRN

## 2021-05-01 NOTE — Progress Notes (Signed)
Vbg shows pH 7.133, PCO2 of 78, PO2 of 36.  Will continue Bipap in progressive care bed.

## 2021-05-01 NOTE — ED Notes (Signed)
Pt has been resting in bed with eyes closed on bipap, no distress noted, call light in reach.

## 2021-05-01 NOTE — Progress Notes (Signed)
  Progress Note    Angel Brewer   [id number]  DOB: October 16, 1965  DOA: 04/30/2021     1 Date of Service: 05/01/2021   Clinical Course 55 year old African-American female with a history of hypertension, history of recurrent PE on Eliquis, pulmonary hypertension, super morbid obesity with a BMI of 63 presents to the ER today with a 1.5-week history of worsening shortness of breath.  Patient states that she has not been taking her Lasix daily as prescribed.  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  She has been short of breath for the last week and a half.  She states that she normally has chronic dyspnea but sometimes she has good days and bad days.  Over the last week and a half, she has noticed her breathing has gotten steadily worse.  She has noticed increasing lower extremity edema.   Assessment and Plan * Acute respiratory failure with hypoxia and hypercapnia (HCC) Admit to progressive care.  -Continue with  Bipap. CTPA pending to rule out PE. -  Pt has been non-compliant with taking her lasix daily. CXR is useless given her body habitus. Will need CTA to evaluate her hypoxia.  -BNP normal but given her LE edema and hx of pulmonary HTN, she could be having right sided heart failure. -daily weights/strict I/Os -may need to consult Dr. Terri Skains  Pulmonary hypertension ([place]) Pt had pulmonary HTN on prior echo but had poor echo windows. May need cardiology consult to consider TEE or RHC to better evaluate the severity of her pulmonary HTN. Pt also with hx of OSA that is untreated. -follows with Dr. Terri Skains  Chronic anticoagulation Hx of recurrent PE.  - states she is compliant with her eliquis. - CTA to r/o PE pending  Essential hypertension - non-compliance with her lasix. -works as a Scientist, water quality at [employer] and does not take her lasix everyday since she has to urinate a  lot.    Subjective:  On bipap, difficult to understand  Objective Vitals:   05/01/21 0600 05/01/21 0800 05/01/21 0930 05/01/21 1230  BP: 134/76 (!) 141/82 (!) 145/73 135/76  Pulse: 73 89 80 80  Resp: _0 Temp:      TempSrc:      SpO2: 92% 91% 95% 96%  Weight:      Height:       (!) 188.2 kg     Exam  General: Appearance:    Severely obese female in no acute distress     Lungs:     On bipap, breath sounds diminished  Heart:    Normal heart rate.   MS:   All extremities are intact.    Neurologic:   Awake, alert, on bipap so difficult to understand what she is saying     Labs / Other Information My review of labs, imaging, notes and other tests is significant for improving CO2 and PH on ABG, Cr stable     Disposition Plan: Status is: Inpatient  Remains inpatient appropriate because: needs improved respiratory status    Updated niece on phone   Time spent: 35 minutes [facility] 05/01/2021, 1:49 PM

## 2021-05-01 NOTE — Progress Notes (Signed)
BiPAP patient transported from ED34 to CT and to 2W22 without any complications.

## 2021-05-01 NOTE — ED Notes (Signed)
Pt moved to bariatric bed with assistance of 4 staff members.

## 2021-05-01 NOTE — Progress Notes (Signed)
RT attempted ABG X 2.  Unable to obtain. Patient jerking arms.  RT to request venous gas.

## 2021-05-01 NOTE — ED Notes (Signed)
[facility] niece/caregiver [phone] requesting an update

## 2021-05-01 NOTE — Progress Notes (Signed)
ABG sent to lab, lab made aware.

## 2021-05-02 ENCOUNTER — Inpatient Hospital Stay (HOSPITAL_COMMUNITY): Payer: Medicaid Other

## 2021-05-02 DIAGNOSIS — I5031 Acute diastolic (congestive) heart failure: Secondary | ICD-10-CM | POA: Diagnosis not present

## 2021-05-02 DIAGNOSIS — J9601 Acute respiratory failure with hypoxia: Secondary | ICD-10-CM | POA: Diagnosis not present

## 2021-05-02 DIAGNOSIS — J9602 Acute respiratory failure with hypercapnia: Secondary | ICD-10-CM | POA: Diagnosis not present

## 2021-05-02 LAB — CBC
HCT: 39.9 % (ref 36.0–46.0)
Hemoglobin: 11.8 g/dL — ABNORMAL LOW (ref 12.0–15.0)
MCH: 29.1 pg (ref 26.0–34.0)
MCHC: 29.6 g/dL — ABNORMAL LOW (ref 30.0–36.0)
MCV: 98.5 fL (ref 80.0–100.0)
Platelets: 182 10*3/uL (ref 150–400)
RBC: 4.05 MIL/uL (ref 3.87–5.11)
RDW: 14.6 % (ref 11.5–15.5)
WBC: 6 10*3/uL (ref 4.0–10.5)
nRBC: 1.2 % — ABNORMAL HIGH (ref 0.0–0.2)

## 2021-05-02 LAB — ECHOCARDIOGRAM COMPLETE
Area-P 1/2: 4.21 cm2
Height: 68 in
S' Lateral: 3.2 cm
Weight: 7824 oz

## 2021-05-02 LAB — BASIC METABOLIC PANEL WITH GFR
Anion gap: 9 (ref 5–15)
BUN: 16 mg/dL (ref 6–20)
CO2: 35 mmol/L — ABNORMAL HIGH (ref 22–32)
Calcium: 8.9 mg/dL (ref 8.9–10.3)
Chloride: 96 mmol/L — ABNORMAL LOW (ref 98–111)
Creatinine, Ser: 0.97 mg/dL (ref 0.44–1.00)
GFR, Estimated: >60 mL/min
Glucose, Bld: 74 mg/dL (ref 70–99)
Potassium: 4.7 mmol/L (ref 3.5–5.1)
Sodium: 140 mmol/L (ref 135–145)

## 2021-05-02 MED ORDER — PERFLUTREN LIPID MICROSPHERE
1.0000 mL | INTRAVENOUS | Status: DC | PRN
  Administered 2021-05-02: 2 mL via INTRAVENOUS
  Filled 2021-05-02: qty 10

## 2021-05-02 NOTE — Progress Notes (Signed)
Rec'd pt on bipap, pt appears to be comfortable.  Pt does open eyes and nod head "yes" when I asked her if she was okay.  Sat 100%, fio2 weaned to 45%.  No distress currently noted.

## 2021-05-02 NOTE — Progress Notes (Signed)
RN called d/t sat reading low-mid 80's,  [place] flow increased to  4 lpm  by RN.  Once I came to room, sat was 90-93% on 4 lpm.   Hands/fingers cold, once hands warmed, sat 90-93%.  No distress noted.

## 2021-05-02 NOTE — Progress Notes (Addendum)
  Progress Note    Avaleen Brewer   [id number]  DOB: May 14, 1966  DOA: 04/30/2021     2 Date of Service: 05/02/2021   Clinical Course 55 year old African-American female with a history of hypertension, history of recurrent PE on Eliquis, pulmonary hypertension, super morbid obesity with a BMI of 63 presents to the ER today with a 1.5-week history of worsening shortness of breath.  Patient states that she has not been taking her Lasix daily as prescribed.  Responding to IV lasix.  Family reports that she is unable to wear her CPAP at home due to a mask malfunction   Assessment and Plan * Acute respiratory failure with hypoxia and hypercapnia ([place]) Admit to progressive care.  -BIPAP QHS - wean to  and keep sats 88-90% -  Pt has been non-compliant with taking her lasix daily. CXR is useless given her body habitus. Will need CTA to evaluate her hypoxia.  -BNP normal but given her LE edema and hx of pulmonary HTN, she could be having right sided heart failure. -daily weights/strict I/Os -may need to consult Dr. Terri Skains -down 4.6L  Pulmonary hypertension ([place]) Pt had pulmonary HTN on prior echo but had poor echo windows. May need cardiology consult to consider TEE or RHC to better evaluate the severity of her pulmonary HTN. Pt also with hx of OSA that is untreated. -follows with Dr. Terri Skains -await echo reading  Chronic anticoagulation Hx of recurrent PE.  - states she is compliant with her eliquis. - CTA negative for PE  Essential hypertension - non-compliance with her lasix. 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  -unfortunately weights are not accurate  Suspected OHS  -may need trilogy    Subjective:  Breathing better, would like to try BIPAP off  Objective Vitals:   05/02/21 0946 05/02/21 1137 05/02/21 1200 05/02/21 1210  BP:   110/82   Pulse: (!) 57 (!) 57 62 (!) 58  Resp: 18 (!) _0 Temp:   98.2  F (36.8 C)   TempSrc:   Oral   SpO2: 100% 98% (!) 74% 91%  Weight:      Height:       (!) 221.8 kg     Exam  General: Appearance:    Severely obese female in no acute distress     Lungs:     Diminished, on bipap, respirations unlabored  Heart:    Bradycardic.   MS:   All extremities are intact. Improved edema in thighs   Neurologic:   Awake, alert, oriented x 3. No apparent focal neurological           defect.      Labs / Other Information My review of labs, imaging, notes and other tests shows no new significant findings.    Disposition Plan: Status is: Inpatient  Remains inpatient appropriate because: needs further work up       Time spent: 35  minutes [facility] 05/02/2021, 1:10 PM

## 2021-05-02 NOTE — Progress Notes (Signed)
Critical lab results per pathology: PCO2=86.6. Dr. Rudene Christians notified and made aware of critical results. Per MD: continuing BiPAP and team will reassess in the AM. RN will continue to monitor.

## 2021-05-02 NOTE — Progress Notes (Signed)
Per chat/discussion w/ Dr Eliseo Squires, MD request pt off bipap for now and Sat goal 88-90 acceptable.  Pt placed on 2 lpm [place], sat 98%.  Pt denies SOB, when asked how she's breathing, pt states "okay".  No distress noted.

## 2021-05-02 NOTE — TOC Progression Note (Signed)
Transition of Care [facility]) - Progression Note    Patient Details  Name: Octivia Canion MRN: [id number] Date of Birth: 17-Jul-1965  Transition of Care [facility]) CM/SW [place], RN Phone [phone]  05/02/2021, 2:11 PM  Clinical Narrative:    Ms. Winbush presents with obesity hypoventilation syndrome.  The use of the NIV will treat patients high PC02 levels (86.6 on 05/01/21 with elevated bicarb of 36.6) and can reduce risk of exacerbations and future hospitalizations when used at night and during the day.  All alternate devices [phone] and [id number]) have been proven ineffective to provide essential volume control necessary to maintain acceptable CO2 levels. An NIV with AVAPS AE is necessary to prevent patient harm.  Interruption or failure to provide NIV would quickly lead to exacerbation of the patients condition, hospital admission, and likely harm to the patient. Continued use is preferred.  Patient is able to protect their airways and clear secretions on their own.        Expected Discharge Plan and Services                                                 Social Determinants of Health (SDOH) Interventions    Readmission Risk Interventions Readmission Risk Prevention Plan 12/02/2019  Post Dischage Appt Complete  Medication Screening Complete  Transportation Screening Complete  Some recent data might be hidden

## 2021-05-02 NOTE — Progress Notes (Signed)
  Echocardiogram 2D Echocardiogram has been performed.  Fidel Levy 05/02/2021, 9:09 AM

## 2021-05-03 ENCOUNTER — Inpatient Hospital Stay (HOSPITAL_COMMUNITY): Payer: Medicaid Other

## 2021-05-03 DIAGNOSIS — I517 Cardiomegaly: Secondary | ICD-10-CM | POA: Diagnosis not present

## 2021-05-03 DIAGNOSIS — J811 Chronic pulmonary edema: Secondary | ICD-10-CM | POA: Diagnosis not present

## 2021-05-03 DIAGNOSIS — E662 Morbid (severe) obesity with alveolar hypoventilation: Secondary | ICD-10-CM

## 2021-05-03 DIAGNOSIS — R0600 Dyspnea, unspecified: Secondary | ICD-10-CM | POA: Diagnosis not present

## 2021-05-03 DIAGNOSIS — J9602 Acute respiratory failure with hypercapnia: Secondary | ICD-10-CM | POA: Diagnosis not present

## 2021-05-03 DIAGNOSIS — J9601 Acute respiratory failure with hypoxia: Secondary | ICD-10-CM | POA: Diagnosis not present

## 2021-05-03 HISTORY — DX: Morbid (severe) obesity with alveolar hypoventilation: E66.2

## 2021-05-03 LAB — BASIC METABOLIC PANEL
Anion gap: 11 (ref 5–15)
BUN: 21 mg/dL — ABNORMAL HIGH (ref 6–20)
CO2: 36 mmol/L — ABNORMAL HIGH (ref 22–32)
Calcium: 9 mg/dL (ref 8.9–10.3)
Chloride: 91 mmol/L — ABNORMAL LOW (ref 98–111)
Creatinine, Ser: 0.99 mg/dL (ref 0.44–1.00)
GFR, Estimated: >60 mL/min (ref >60)
Glucose, Bld: 85 mg/dL (ref 70–99)
Potassium: 4 mmol/L (ref 3.5–5.1)
Sodium: 138 mmol/L (ref 135–145)

## 2021-05-03 LAB — CBC
HCT: 38.5 % (ref 36.0–46.0)
Hemoglobin: 11.6 g/dL — ABNORMAL LOW (ref 12.0–15.0)
MCH: 29.2 pg (ref 26.0–34.0)
MCHC: 30.1 g/dL (ref 30.0–36.0)
MCV: 97 fL (ref 80.0–100.0)
Platelets: 187 10*3/uL (ref 150–400)
RBC: 3.97 MIL/uL (ref 3.87–5.11)
RDW: 14.4 % (ref 11.5–15.5)
WBC: 4.9 10*3/uL (ref 4.0–10.5)
nRBC: 0 % (ref 0.0–0.2)

## 2021-05-03 MED ORDER — ACETAZOLAMIDE 250 MG PO TABS
500.0000 mg | ORAL_TABLET | Freq: Every day | ORAL | Status: DC
  Administered 2021-05-03 – 2021-05-04 (×2): 500 mg via ORAL
  Filled 2021-05-03 (×2): qty 2

## 2021-05-03 NOTE — Assessment & Plan Note (Addendum)
-  trilogy to be arranged as CPAP not working at home -needs continued weight loss efforts

## 2021-05-03 NOTE — Evaluation (Signed)
Physical Therapy Evaluation Patient Details Name: Angel Brewer MRN: [id number] DOB: August 28, 1965 Today's Date: 05/03/2021  History of Present Illness  55yo female wh presented on 10/18 with a 1.5 week hx of SOB, also reports she has not been taking Lasix. PE negative. Found to be in acute respiratory failure with hypoxia. PMH HTN, hx recurrent PE on Eliquis, super morbid obesity, hx DVT, wrist ligament reconstruction, radial ORIF  Clinical Impression   Patient received in bed, initially pleasant and cooperative however once challenged with mobility became very self limiting and at times agitated with therapist. Able to get to EOB with MaxAx1 and second person for safety; once at EOB perseverated on bed "not being like hers at home", also refused to try standing with therapist because "that walker does not lock" (2 wheeled walker does not lock by design). Not receptive to cues for mobility today. RN assisted with return to bed. Left positioned to comfort with all needs met. Difficult to really get a good feel for cognition or true functional potential today- will need to be seen with +2 assist to determine how much she is able to ambulate next day of service.        Recommendations for follow up therapy are one component of a multi-disciplinary discharge planning process, led by the attending physician.  Recommendations may be updated based on patient status, additional functional criteria and insurance authorization.  Follow Up Recommendations Home health PT;SNF;Other (comment) (pending progress)    Equipment Recommendations  Rolling walker with 5" wheels;3in1 (PT);Wheelchair (measurements PT);Wheelchair cushion (measurements PT) (bariatric equipment)    Recommendations for Other Services       Precautions / Restrictions Precautions Precautions: Fall;Other (comment) Precaution Comments: watch sats Restrictions Weight Bearing Restrictions: No      Mobility  Bed Mobility Overal bed  mobility: Needs Assistance Bed Mobility: Supine to Sit;Sit to Supine     Supine to sit: Max assist;HOB elevated Sit to supine: Mod assist   General bed mobility comments: MaxA to help elevate trunk to upright sitting position, had second person for safety per patient request but not needed; able to get back to bed with ModA for BLE management, then totalAx2 to slide up in bed    Transfers                 General transfer comment: refused  Ambulation/Gait             General Gait Details: refused  Stairs            Wheelchair Mobility    Modified Rankin (Stroke Patients Only)       Balance Overall balance assessment: Needs assistance Sitting-balance support: Feet supported;Bilateral upper extremity supported Sitting balance-[name]: Good         Standing balance comment: refused to attempt                             Pertinent Vitals/Pain Pain Assessment: No/denies pain    Home Living Family/patient expects to be discharged to:: Private residence Living Arrangements: Alone Available Help at Discharge: Family;Available PRN/intermittently;Other (Comment) (son can stop by and check on her) Type of Home: House Home Access: Stairs to enter   [employer] of Steps: 1 Home Layout: Able to live on main level with bedroom/bathroom Home Equipment: Walker - 2 wheels;Bedside commode;Shower seat      Prior Function Level of Independence: Independent with assistive device(s)  Comments: drives; works at [employer] at [place]        Extremity/Trunk Assessment   Upper Extremity Assessment Upper Extremity Assessment: Defer to OT evaluation    Lower Extremity Assessment Lower Extremity Assessment: Generalized weakness    Cervical / Trunk Assessment Cervical / Trunk Assessment: Normal  Communication   Communication: No difficulties  Cognition Arousal/Alertness: Awake/alert Behavior During  Therapy: WFL for tasks assessed/performed;Anxious Overall Cognitive Status: No family/caregiver present to determine baseline cognitive functioning                                 General Comments: initially seemed WNL, then very self limiting and concerned about various things such as 2 wheeled walker not locking, also became quite agitated with PT. Difficult to fully assess cognition today      General Comments General comments (skin integrity, edema, etc.): did not have accurate or high quality pleth for most of session- no SOB noted, and in 90s at rest on 3LPM    Exercises     Assessment/Plan    PT Assessment Patient needs continued PT services  PT Problem List Decreased strength;Decreased knowledge of use of DME;Obesity;Decreased activity tolerance;Decreased safety awareness;Decreased balance;Decreased mobility;Decreased coordination;Cardiopulmonary status limiting activity       PT Treatment Interventions DME instruction;Balance training;Gait training;Stair training;Functional mobility training;Patient/family education;Therapeutic activities;Therapeutic exercise    PT Goals (Current goals can be found in the Care Plan section)  Acute Rehab PT Goals Patient Stated Goal: go home PT Goal Formulation: With patient Time For Goal Achievement: 05/17/21 Potential to Achieve Goals: Good    Frequency Min 3X/week   Barriers to discharge        Co-evaluation               AM-PAC PT "6 Clicks" Mobility  Outcome Measure Help needed turning from your back to your side while in a flat bed without using bedrails?: A [place] Help needed moving from lying on your back to sitting on the side of a flat bed without using bedrails?: A Lot Help needed moving to and from a bed to a chair (including a wheelchair)?: Total Help needed standing up from a chair using your arms (e.g., wheelchair or bedside chair)?: Total Help needed to walk in hospital room?: Total Help needed  climbing 3-5 steps with a railing? : Total 6 Click Score: 9    End of Session   Activity Tolerance: Patient tolerated treatment well;Other (comment) (but self limiting) Patient left: in bed;with call bell/phone within reach Nurse Communication: Mobility status PT Visit Diagnosis: Unsteadiness on feet (R26.81);Muscle weakness (generalized) (M62.81);Difficulty in walking, not elsewhere classified (R26.2)    Time: 1450-1530 PT Time Calculation (min) (ACUTE ONLY): 40 min   Charges:   PT Evaluation $PT Eval Moderate Complexity: 1 Mod medicaid eval          Windell Norfolk, DPT, PN2   Supplemental Physical Therapist [place]    Pager [phone] Acute Rehab Office [phone]

## 2021-05-03 NOTE — Progress Notes (Signed)
  Progress Note    Angel Brewer   [id number]  DOB: [birth year]  DOA: 04/30/2021     3 Date of Service: 05/03/2021   Clinical Course 55 year old African-American female with a history of hypertension, history of recurrent PE on Eliquis, pulmonary hypertension, super morbid obesity with a BMI of 63 presents to the ER today with a 1.5-week history of worsening shortness of breath.  Patient states that she has not been taking her Lasix daily as prescribed.  Responding to IV lasix.  Family reports that she is unable to wear her CPAP at home due to a mask malfunction.    Assessment and Plan * Acute respiratory failure with hypoxia and hypercapnia (HCC) Admit to progressive care.  -BIPAP QHS - wean to [place] and keep sats 88-90%. -daily weights/strict I/Os -down 7.5L -continue IV lasix as long as CR stable  Obesity hypoventilation syndrome (HCC) -trilogy to be arranged as CPAP not working at home  Pulmonary hypertension ([place]) Pt had pulmonary HTN on prior echo but had poor echo windows.  -echo: There is normal pulmonary artery systolic pressure, EF normal  Chronic anticoagulation Hx of recurrent PE.  - states she is compliant with her eliquis. - CTA negative for PE  Essential hypertension - non-compliance with her lasix. 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 -BP stable-- monitor while getting lasix  Morbid obesity ([place]) Estimated body mass index is 73.44 kg/m as calculated from the following:   Height as of this encounter: _0  (1.727 m).   Weight as of this encounter: 219.1 kg.  PT eval pending  Subjective:  Breathing better, was not wearing CPAP at home  Objective Vitals:   05/03/21 0333 05/03/21 0800 05/03/21 0801 05/03/21 1200  BP: 130/73 (!) 95/56  106/68  Pulse: (!) 54 (!) 55  (!) 52  Resp:  16  17  Temp: 97.7 F (36.5 C) 97.9 F (36.6 C) 97.9 F (36.6 C) 97.8 F (36.6 C)  TempSrc: Axillary  Oral Oral Oral  SpO2:  93%  91%  Weight: (!) 219.1 kg     Height:       (!) 219.1 kg    Exam  General: Appearance:    Severely obese female in no acute distress, on [place]     Lungs:     diminished, respirations unlabored  Heart:    Bradycardic.   MS:   All extremities are intact. Less LE edema   Neurologic:   Awake, alert, oriented x 3     Labs / Other Information My review of labs, imaging, notes and other tests is significant for stable Cr, rising CO2     Disposition Plan: Status is: Inpatient  Remains inpatient appropriate because: needs further diuresis       Time spent: 35  minutes [facility] 05/03/2021, 3:42 PM

## 2021-05-03 NOTE — Assessment & Plan Note (Addendum)
Estimated body mass index is 60.97 kg/m as calculated from the following:   Height as of this encounter: 5' 8" (1.727 m).   Weight as of this encounter: 181.9 kg.

## 2021-05-04 DIAGNOSIS — J9602 Acute respiratory failure with hypercapnia: Secondary | ICD-10-CM | POA: Diagnosis not present

## 2021-05-04 DIAGNOSIS — J9601 Acute respiratory failure with hypoxia: Secondary | ICD-10-CM | POA: Diagnosis not present

## 2021-05-04 LAB — BASIC METABOLIC PANEL
Anion gap: 7 (ref 5–15)
BUN: 25 mg/dL — ABNORMAL HIGH (ref 6–20)
CO2: 34 mmol/L — ABNORMAL HIGH (ref 22–32)
Calcium: 9.1 mg/dL (ref 8.9–10.3)
Chloride: 95 mmol/L — ABNORMAL LOW (ref 98–111)
Creatinine, Ser: 1.07 mg/dL — ABNORMAL HIGH (ref 0.44–1.00)
GFR, Estimated: >60 mL/min (ref >60)
Glucose, Bld: 103 mg/dL — ABNORMAL HIGH (ref 70–99)
Potassium: 3.8 mmol/L (ref 3.5–5.1)
Sodium: 136 mmol/L (ref 135–145)

## 2021-05-04 LAB — CBC
HCT: 39.8 % (ref 36.0–46.0)
Hemoglobin: 12 g/dL (ref 12.0–15.0)
MCH: 29 pg (ref 26.0–34.0)
MCHC: 30.2 g/dL (ref 30.0–36.0)
MCV: 96.1 fL (ref 80.0–100.0)
Platelets: 188 10*3/uL (ref 150–400)
RBC: 4.14 MIL/uL (ref 3.87–5.11)
RDW: 14.4 % (ref 11.5–15.5)
WBC: 5.2 10*3/uL (ref 4.0–10.5)
nRBC: 0 % (ref 0.0–0.2)

## 2021-05-04 MED ORDER — ACETAZOLAMIDE 250 MG PO TABS
500.0000 mg | ORAL_TABLET | Freq: Two times a day (BID) | ORAL | Status: DC
  Administered 2021-05-05 – 2021-05-06 (×3): 500 mg via ORAL
  Filled 2021-05-04 (×5): qty 2

## 2021-05-04 MED ORDER — FUROSEMIDE 10 MG/ML IJ SOLN
40.0000 mg | Freq: Every day | INTRAMUSCULAR | Status: DC
  Administered 2021-05-05: 40 mg via INTRAVENOUS
  Filled 2021-05-04: qty 4

## 2021-05-04 NOTE — Progress Notes (Signed)
  Progress Note    Angel Brewer   [id number]  DOB: 05-Sep-1965  DOA: 04/30/2021     4 Date of Service: 05/04/2021   Clinical Course 55 year old African-American female with a history of hypertension, history of recurrent PE on Eliquis, pulmonary hypertension, super morbid obesity with a BMI of 63 presents to the ER today with a 1.5-week history of worsening shortness of breath.  Patient states that she has not been taking her Lasix daily as prescribed.  Responding to IV lasix.  Family reports that she is unable to wear her CPAP at home due to a mask malfunction.  Assessment and Plan * Acute respiratory failure with hypoxia and hypercapnia (HCC) Admit to progressive care.  -BIPAP QHS - wean to [place] and keep sats 88-90%. -daily weights/strict I/Os -down >10L -continue IV lasix at lower dose -daily labs  Obesity hypoventilation syndrome ([place]) -trilogy to be arranged as CPAP not working at home -needs continued weight loss efforts  Pulmonary hypertension ([place]) Pt had pulmonary HTN on prior echo but had poor echo windows.  -echo: There is normal pulmonary artery systolic pressure, EF normal  Chronic anticoagulation Hx of recurrent PE.  - states she is compliant with her eliquis. - CTA negative for PE  Essential hypertension - non-compliance with her lasix. 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 -BP stable-- monitor while getting lasix  Morbid obesity ([place]) Estimated body mass index is 71.77 kg/m as calculated from the following:   Height as of this encounter: _0  (1.727 m).   Weight as of this encounter: 214.1 kg.    Subjective:  In chair, feeling better  Objective Vitals:   05/04/21 0407 05/04/21 0805 05/04/21 0818 05/04/21 1202  BP: 120/64 121/64  109/69  Pulse: (!) 54     Resp: 17     Temp: 97.6 F (36.4 C) 98.1 F (36.7 C)    TempSrc: Axillary Oral    SpO2: 97%  90%   Weight: (!) 214.1 kg      Height:       (!) 214.1 kg    Exam  General: Appearance:    Severely obese female in no acute distress     Lungs:     Diminished, respirations unlabored  Heart:    Bradycardic.   MS:   All extremities are intact.    Neurologic:   Awake, alert, oriented x 3     Labs / Other Information My review of labs, imaging, notes and other tests shows no new significant findings.    Disposition Plan: Status is: Inpatient  Remains inpatient appropriate because: needs further diuresis       Time spent: 35 minutes [facility] 05/04/2021, 1:45 PM

## 2021-05-04 NOTE — Progress Notes (Signed)
Physical Therapy Treatment Patient Details Name: Angel Brewer MRN: [id number] DOB: 1965-08-01 Today's Date: 05/04/2021   History of Present Illness 55yo female wh presented on 10/18 with a 1.5 week hx of SOB, also reports she has not been taking Lasix. PE negative. Found to be in acute respiratory failure with hypoxia. PMH HTN, hx recurrent PE on Eliquis, super morbid obesity, hx DVT, wrist ligament reconstruction, radial ORIF    PT Comments    Pt progressing well and demo's significant improvement from mobility stand point. Pt reports "I didn't feel safe with just one person yesterday, that's why I didn't get up." Pt amb 120' with RW. Pt >88% on 3LO2 via [place]. Suspect pt will be safe to d/c home with use of RW for energy conservation once medically stable and cleared for d/c. Acute PT to cont to follow.    Recommendations for follow up therapy are one component of a multi-disciplinary discharge planning process, led by the attending physician.  Recommendations may be updated based on patient status, additional functional criteria and insurance authorization.  Follow Up Recommendations  Home health PT;Other (comment);Supervision - Intermittent     Equipment Recommendations   (pt states she has a walker at home from last admission)    Recommendations for Other Services       Precautions / Restrictions Precautions Precautions: Fall;Other (comment) Precaution Comments: watch sats Restrictions Weight Bearing Restrictions: No     Mobility  Bed Mobility Overal bed mobility: Needs Assistance Bed Mobility: Supine to Sit     Supine to sit: HOB elevated;Min assist     General bed mobility comments: pt in air bed and reports "I hate this bed, i get stuck and can't move. Id rather have a regular bed". MinA to help pt bring hips to EOB, pt used PT as an anchor to pull on    Transfers Overall transfer level: Needs assistance Equipment used:  (2 person front to front assist under  arms) Transfers: Sit to/from Stand Sit to Stand: Min assist;+2 physical assistance         General transfer comment: pt attempted without assist however unable to power up, was unable to get leverage on air mattress to push up and couldn't pull up on walker from a safety standpt, PT and tech assist pt with rocking forward and intial power up  Ambulation/Gait Ambulation/Gait assistance: Min assist;+2 safety/equipment Gait Distance (Feet): 120 Feet Assistive device: Rolling walker (2 wheeled) (bariatric) Gait Pattern/deviations: Step-through pattern;Decreased stride length;Wide base of support Gait velocity: slower than normal Gait velocity interpretation: <1.31 ft/sec, indicative of household ambulator General Gait Details: pt initiallly with noted weakness in LEs most likely due to first time up in 3 days, pt became increasingly more steady t/o ambulation progressing from minA to min guard. SPO2 >91% on LO2 via [place] t/o amb, did drop to 88% occasionally but able to recover quickly with deep breathing   Stairs             Wheelchair Mobility    Modified Rankin (Stroke Patients Only)       Balance Overall balance assessment: Needs assistance Sitting-balance support: Feet supported;Bilateral upper extremity supported Sitting balance-[name]: Good     Standing balance support: During functional activity;Bilateral upper extremity supported Standing balance-[name]: Fair Standing balance comment: RW helpful                            Cognition Arousal/Alertness: Awake/alert Behavior During Therapy:  WFL for tasks assessed/performed;Anxious Overall Cognitive Status: Within Functional Limits for tasks assessed                                 General Comments: pt intune to deficits, eager to return home but aware she isn't medically ready, stated "I would've tried to walk yesterday but she was here by herself and I know she couldn't help me if  my legs gave way and fell"      Exercises      General Comments General comments (skin integrity, edema, etc.): SpO2 >91% on 3LO2 via [place] majority of session, dropped occasionally to 88% but recovered quickly      Pertinent Vitals/Pain Pain Assessment: No/denies pain    Home Living                      Prior Function            PT Goals (current goals can now be found in the care plan section) Acute Rehab PT Goals Patient Stated Goal: go home PT Goal Formulation: With patient Time For Goal Achievement: 05/17/21 Potential to Achieve Goals: Good Progress towards PT goals: Progressing toward goals    Frequency    Min 3X/week      PT Plan Current plan remains appropriate    Co-evaluation              AM-PAC PT "6 Clicks" Mobility   Outcome Measure  Help needed turning from your back to your side while in a flat bed without using bedrails?: A [place] Help needed moving from lying on your back to sitting on the side of a flat bed without using bedrails?: A [place] Help needed moving to and from a bed to a chair (including a wheelchair)?: A [place] Help needed standing up from a chair using your arms (e.g., wheelchair or bedside chair)?: A [place] Help needed to walk in hospital room?: A [place] Help needed climbing 3-5 steps with a railing? : A Lot 6 Click Score: 17    End of Session Equipment Utilized During Treatment: Oxygen (3LO2 via [place]) Activity Tolerance: Patient tolerated treatment well Patient left: with call bell/phone within reach;in chair Nurse Communication: Mobility status (pt's request for a regular bed) PT Visit Diagnosis: Unsteadiness on feet (R26.81);Muscle weakness (generalized) (M62.81);Difficulty in walking, not elsewhere classified (R26.2)     Time: 3734-2876 PT Time Calculation (min) (ACUTE ONLY): 29 min  Charges:  $Gait Training: 23-37 mins                     Kittie Plater, PT, DPT Acute Rehabilitation Services Pager #:  [phone] Office #: [phone]    Berline Lopes 05/04/2021, 10:16 AM

## 2021-05-04 NOTE — Progress Notes (Signed)
Pt continues to be bradycardic with hr running in the 40's to 50's. All other vs remain within normal limits. Spoke with provider and at this since pt is in no distress this may be related to her dx of sleep apnea. Will continue to monitor vs and tx as indicated.

## 2021-05-05 DIAGNOSIS — J9602 Acute respiratory failure with hypercapnia: Secondary | ICD-10-CM | POA: Diagnosis not present

## 2021-05-05 DIAGNOSIS — J9601 Acute respiratory failure with hypoxia: Secondary | ICD-10-CM | POA: Diagnosis not present

## 2021-05-05 LAB — BASIC METABOLIC PANEL
Anion gap: 6 (ref 5–15)
BUN: 29 mg/dL — ABNORMAL HIGH (ref 6–20)
CO2: 30 mmol/L (ref 22–32)
Calcium: 9.4 mg/dL (ref 8.9–10.3)
Chloride: 100 mmol/L (ref 98–111)
Creatinine, Ser: 0.98 mg/dL (ref 0.44–1.00)
GFR, Estimated: >60 mL/min (ref >60)
Glucose, Bld: 95 mg/dL (ref 70–99)
Potassium: 4.4 mmol/L (ref 3.5–5.1)
Sodium: 136 mmol/L (ref 135–145)

## 2021-05-05 MED ORDER — FUROSEMIDE 40 MG PO TABS
40.0000 mg | ORAL_TABLET | Freq: Every day | ORAL | Status: DC
  Administered 2021-05-06 – 2021-05-08 (×3): 40 mg via ORAL
  Filled 2021-05-05 (×3): qty 1

## 2021-05-05 NOTE — Progress Notes (Signed)
Pt previously on 2L. Pt weaned to room air, sustaining oxygen saturation at 96% and tolerating well.

## 2021-05-05 NOTE — Progress Notes (Signed)
  Progress Note    Angel Brewer   [id number]  DOB: [birth year]  DOA: 04/30/2021     5 Date of Service: 05/05/2021   Clinical Course 55 year old African-American female with a history of hypertension, history of recurrent PE on Eliquis, pulmonary hypertension, super morbid obesity with a BMI of 63 presents to the ER today with a 1.5-week history of worsening shortness of breath.  Patient states that she has not been taking her Lasix daily as prescribed.  Responding to IV lasix.  Family reports that she is unable to wear her CPAP at home due to a mask malfunction.  Slowly getting better, needs a triligy at home and to be compliant with her lasix  Assessment and Plan * Acute respiratory failure with hypoxia and hypercapnia (HCC) -BIPAP QHS - wean to [place] and keep sats 88-90%. -daily weights/strict I/Os -down >10L -change IV lasix to PO -daily labs  Obesity hypoventilation syndrome ([place]) -trilogy to be arranged as CPAP not working at home -needs continued weight loss efforts  Pulmonary hypertension ([place]) Pt had pulmonary HTN on prior echo but had poor echo windows.  -echo: There is normal pulmonary artery systolic pressure, EF normal  Chronic anticoagulation Hx of recurrent PE.  - states she is compliant with her eliquis. - CTA negative for PE  Essential hypertension - non-compliance with her lasix. 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 -BP stable-- monitor while getting lasix  Morbid obesity ([place]) Estimated body mass index is 61.44 kg/m as calculated from the following:   Height as of this encounter: _0  (1.727 m).   Weight as of this encounter: 183.3 kg.    Subjective:  In chair, feels like breathing is back to baseline  Objective Vitals:   05/04/21 2300 05/05/21 0344 05/05/21 0500 05/05/21 1030  BP:  95/63    Pulse: (!) 55 65    Resp: 17 19    Temp:  98.4 F (36.9 C)    TempSrc:      SpO2:  95% 95%  96%  Weight:   (!) 183.3 kg   Height:       (!) 183.3 kg     Exam  General: Appearance:    Severely obese female in no acute distress     Lungs:     respirations unlabored  Heart:    Normal heart rate.   MS:   All extremities are intact.    Neurologic:   Awake, alert, oriented x 3. No apparent focal neurological           defect.      Labs / Other Information There are no new results to review at this time.   Disposition Plan: Status is: Inpatient  Remains inpatient appropriate because: 35 min       Time spent: 25  minutes [facility] 05/05/2021, 2:58 PM

## 2021-05-06 DIAGNOSIS — J9602 Acute respiratory failure with hypercapnia: Secondary | ICD-10-CM | POA: Diagnosis not present

## 2021-05-06 DIAGNOSIS — J9601 Acute respiratory failure with hypoxia: Secondary | ICD-10-CM | POA: Diagnosis not present

## 2021-05-06 NOTE — Progress Notes (Signed)
  Progress Note    Angel Brewer   [id number]  DOB: Dec 31, 1965  DOA: 04/30/2021     6 Date of Service: 05/06/2021   Clinical Course 55 year old African-American female with a history of hypertension, history of recurrent PE on Eliquis, pulmonary hypertension, super morbid obesity with a BMI of 63 presents to the ER today with a 1.5-week history of worsening shortness of breath.  Patient states that she has not been taking her Lasix daily as prescribed.  Responding to IV lasix.  Family reports that she is unable to wear her CPAP at home due to a mask malfunction.  Assessment and Plan * Acute respiratory failure with hypoxia and hypercapnia (HCC) -BIPAP QHS - wean to [place] and keep sats 88-90%. -daily weights/strict I/Os -down >10L -change IV lasix to PO -daily labs  Obesity hypoventilation syndrome ([place]) -trilogy to be arranged as CPAP not working at home -needs continued weight loss efforts  Pulmonary hypertension ([place]) Pt had pulmonary HTN on prior echo but had poor echo windows.  -echo: There is normal pulmonary artery systolic pressure, EF normal  Chronic anticoagulation Hx of recurrent PE.  - states she is compliant with her eliquis. - CTA negative for PE  Essential hypertension - non-compliance with her lasix. 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 -BP stable-- monitor while getting lasix  Morbid obesity ([place]) Estimated body mass index is 61.18 kg/m as calculated from the following:   Height as of this encounter: _0  (1.727 m).   Weight as of this encounter: 182.5 kg.    Subjective:  No complaints Her I/Os are not being kept  Objective Vitals:   05/06/21 0318 05/06/21 0400 05/06/21 0800 05/06/21 1438  BP:  121/72 112/82 134/76  Pulse:  (!) 59 64 66  Resp:  15 16 (!) 22  Temp:   98.4 F (36.9 C) 97.7 F (36.5 C)  TempSrc:   Oral Oral  SpO2:  100% 97% 94%  Weight: (!) 182.5 kg      Height:       (!) 182.5 kg     Exam  General: Appearance:    Severely obese female in no acute distress     Lungs:    Diminished, respirations unlabored- off O2  Heart:    Normal heart rate.    MS:   All extremities are intact.    Neurologic:   Awake, alert, oriented x 3. No apparent focal neurological           defect.      Labs / Other Information My review of labs, imaging, notes and other tests shows no new significant findings.    Disposition Plan: Status is: Inpatient  Remains inpatient appropriate because: needs trilogy       Time spent: 25 minutes [facility] 05/06/2021, 3:06 PM

## 2021-05-06 NOTE — Progress Notes (Signed)
Physical Therapy Treatment Patient Details Name: Angel Brewer MRN: [id number] DOB: [birth year] Today's Date: 05/06/2021   History of Present Illness 55yo female admitted 10/18 with a 1.5 week hx of SOB. Pt with acute respiratory failure with hypoxia. PMH HTN, hx recurrent PE on Eliquis, morbid obesity, hx DVT, wrist ligament reconstruction, radial ORIF    PT Comments    Pt pleasant on RA and reports having been up to the chair and in room during the day. Pt was able to increase gait tolerance on RA with 2 standing rest breaks to maintain sats 89-95% on RA. Pt encouraged to be OOB for meals and toileting as well as continue hall ambulation. Plan remains appropriate.     Recommendations for follow up therapy are one component of a multi-disciplinary discharge planning process, led by the attending physician.  Recommendations may be updated based on patient status, additional functional criteria and insurance authorization.  Follow Up Recommendations  Home health PT     Assistance Recommended at Discharge    Equipment Recommendations  None recommended by PT    Recommendations for Other Services       Precautions / Restrictions Precautions Precautions: Fall;Other (comment) Precaution Comments: watch sats     Mobility  Bed Mobility Overal bed mobility: Modified Independent Bed Mobility: Supine to Sit;Sit to Supine     Supine to sit: Modified independent (Device/Increase time) Sit to supine: Modified independent (Device/Increase time)   General bed mobility comments: HOb 25 degrees with use of rail to exit and return to bed. Pt declined OOB to chair stating she had just gotten out of the chair    Transfers Overall transfer level: Modified independent                 General transfer comment: pt stood without assist or LOB    Ambulation/Gait Ambulation/Gait assistance: Supervision Gait Distance (Feet): 300 Feet Assistive device: None Gait Pattern/deviations:  Step-through pattern;Decreased stride length;Wide base of support   Gait velocity interpretation: <1.31 ft/sec, indicative of household ambulator General Gait Details: lordotic due to body habitus with slow guarded gait on RA with sats 89-95%. Pt with cues for breathing technique and maximizing distance   Stairs             Wheelchair Mobility    Modified Rankin (Stroke Patients Only)       Balance Overall balance assessment: Mild deficits observed, not formally tested                                          Cognition Arousal/Alertness: Awake/alert Behavior During Therapy: WFL for tasks assessed/performed Overall Cognitive Status: Within Functional Limits for tasks assessed                                          Exercises      General Comments        Pertinent Vitals/Pain Pain Assessment: No/denies pain    Home Living                          Prior Function            PT Goals (current goals can now be found in the care plan section) Progress towards PT goals: Progressing toward goals  Frequency    Min 3X/week      PT Plan Current plan remains appropriate    Co-evaluation              AM-PAC PT "6 Clicks" Mobility   Outcome Measure  Help needed turning from your back to your side while in a flat bed without using bedrails?: A Hodkinson Help needed moving from lying on your back to sitting on the side of a flat bed without using bedrails?: A Potier Help needed moving to and from a bed to a chair (including a wheelchair)?: None Help needed standing up from a chair using your arms (e.g., wheelchair or bedside chair)?: None Help needed to walk in hospital room?: A Difranco Help needed climbing 3-5 steps with a railing? : A Ridener 6 Click Score: 20    End of Session   Activity Tolerance: Patient tolerated treatment well Patient left: in bed;with call bell/phone within reach Nurse  Communication: Mobility status PT Visit Diagnosis: Muscle weakness (generalized) (M62.81);Difficulty in walking, not elsewhere classified (R26.2);Other abnormalities of gait and mobility (R26.89)     Time: 6754-4920 PT Time Calculation (min) (ACUTE ONLY): 20 min  Charges:  $Gait Training: 8-22 mins                     Bayard Males, PT Acute Rehabilitation Services Pager: [phone] Office: [place] 05/06/2021, 3:23 PM

## 2021-05-07 DIAGNOSIS — J9602 Acute respiratory failure with hypercapnia: Secondary | ICD-10-CM | POA: Diagnosis not present

## 2021-05-07 DIAGNOSIS — J9601 Acute respiratory failure with hypoxia: Secondary | ICD-10-CM | POA: Diagnosis not present

## 2021-05-07 NOTE — Progress Notes (Signed)
Pt requested to be taken off Bipap at this time.pt placed on 2L [place]

## 2021-05-07 NOTE — Consult Note (Signed)
   [facility] CM Inpatient Consult   05/07/2021  Angel Brewer 07-14-1966 [id number]  Managed Medicaid: [employer]  Primary Care Provider:  [name], NP, [facility]  Patient screened for hospitalization with noted.  Review of patient's medical record reveals patient's primary care had offered chronic care management with MM team and she declined.  Plan:  Continue to follow progress and disposition to assess for post hospital care management needs.  Working with PT, follow up with progress and needs.  For questions contact:   [name], RN BSN [facility] Liaison  [phone] business mobile phone Toll free office [phone]  Fax number: [phone] [name]

## 2021-05-07 NOTE — Progress Notes (Signed)
  Progress Note    Sri Eaker   CMK:[id number]  DOB: 12-13-1965  DOA: 04/30/2021     7 Date of Service: 05/07/2021   Clinical Course 55 year old African-American female with a history of hypertension, history of recurrent PE on Eliquis, pulmonary hypertension, super morbid obesity with a BMI of 63 presents to the ER today with a 1.5-week history of worsening shortness of breath.  Patient states that she has not been taking her Lasix daily as prescribed.  Responding to IV lasix.  Family reports that she is unable to wear her CPAP at home due to a mask malfunction.  Await trilogy.  Can follow up outpatient with Dr. Terri Skains  Assessment and Plan * Acute respiratory failure with hypoxia and hypercapnia (HCC) -BIPAP QHS - wean to [place] and keep sats 88-90%. -daily weights/strict I/Os -down >10L -change IV lasix to PO -daily labs  Obesity hypoventilation syndrome ([place] [facility]) -trilogy to be arranged as CPAP not working at home -needs continued weight loss efforts  Pulmonary hypertension ([place]) Pt had pulmonary HTN on prior echo but had poor echo windows.  -echo: There is normal pulmonary artery systolic pressure, EF normal  Chronic anticoagulation Hx of recurrent PE.  - states she is compliant with her eliquis. - CTA negative for PE  Essential hypertension - non-compliance with her lasix. 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   Morbid obesity ([place]) Estimated body mass index is 60.97 kg/m as calculated from the following:   Height as of this encounter: _0  (1.727 m).   Weight as of this encounter: 181.9 kg.    Subjective:  Weak but no SOB  Objective Vitals:   05/06/21 1940 05/06/21 2300 05/07/21 0422 05/07/21 1245  BP: 124/75  131/69 113/70  Pulse: 64 63 63   Resp: _1 Temp: 98.3 F (36.8 C)  98.3 F (36.8 C) 97.9 F (36.6 C)  TempSrc: Oral  Oral Oral  SpO2: 93% 98% 97% 94%  Weight:   (!) 181.9 kg    Height:       (!) 181.9 kg     Exam Sitting on side of bed Obese female Appears comfortable  Labs / Other Information There are no new results to review at this time.   Disposition Plan: Status is: Inpatient         Time spent: 35 minutes [facility] 05/07/2021, 3:41 PM

## 2021-05-08 DIAGNOSIS — Z7901 Long term (current) use of anticoagulants: Secondary | ICD-10-CM | POA: Diagnosis not present

## 2021-05-08 DIAGNOSIS — E662 Morbid (severe) obesity with alveolar hypoventilation: Secondary | ICD-10-CM

## 2021-05-08 DIAGNOSIS — I10 Essential (primary) hypertension: Secondary | ICD-10-CM | POA: Diagnosis not present

## 2021-05-08 DIAGNOSIS — J9602 Acute respiratory failure with hypercapnia: Secondary | ICD-10-CM | POA: Diagnosis not present

## 2021-05-08 DIAGNOSIS — J449 Chronic obstructive pulmonary disease, unspecified: Secondary | ICD-10-CM | POA: Diagnosis present

## 2021-05-08 DIAGNOSIS — I270 Primary pulmonary hypertension: Secondary | ICD-10-CM | POA: Diagnosis not present

## 2021-05-08 DIAGNOSIS — I272 Pulmonary hypertension, unspecified: Secondary | ICD-10-CM | POA: Diagnosis not present

## 2021-05-08 DIAGNOSIS — J9601 Acute respiratory failure with hypoxia: Secondary | ICD-10-CM | POA: Diagnosis not present

## 2021-05-08 NOTE — Discharge Summary (Signed)
Physician Discharge Summary  Angel Brewer KVQ:[id number] DOB: Oct 24, 1965 DOA: 04/30/2021  PCP: [name], NP  Admit date: 04/30/2021 Discharge date: 05/08/2021  Time spent: 50 minutes  Recommendations for Outpatient Follow-up:  Follow-up PCP in 2 weeks   Discharge Diagnoses:  Principal Problem:   Acute respiratory failure with hypoxia and hypercapnia ([place]) Active Problems:   Morbid obesity ([place])   Essential hypertension   Chronic anticoagulation   Pulmonary hypertension (HCC)   Obesity hypoventilation syndrome ([place])   Discharge Condition: Stable  Diet recommendation: Heart healthy diet  Filed Weights   05/05/21 0500 05/06/21 0318 05/07/21 0422  Weight: (!) 183.3 kg (!) 182.5 kg (!) 181.9 kg    History of present illness:  55 year old African-American female with history of hypertension, history of recurrent PE on Eliquis, pulmonary hypertension, morbid obesity with BMI of 63 came to ED with complaints of shortness of breath for 1.5 weeks.  Patient has not been taking her Lasix as prescribed, she was started on IV Lasix.  She uses CPAP at home however was not using due to mask malfunction.  Trilogy was obtained in the hospital.  Patient to discharge home on Trilogy.  Hospital Course:   Acute hypoxic and hypercapnic respiratory failure -BiPAP nightly -Patient not requiring oxygen -Diuresed well with IV Lasix, changed to p.o.  Obesity hypoventilation syndrome -trilogy has been obtained -Patient will go home on Trilogy  Pulmonary hypertension -She has history of pulmonary hypertension -Echocardiogram obtained shows normal pulmonary artery systolic pressure.  Chronic anticoagulation -History of recurrent PE -Continue Eliquis at home -CT chest was negative for PE  Essential hypertension -Continue amlodipine, Lasix  Procedures: None  Consultations: None  Discharge Exam: Vitals:   05/08/21 1155 05/08/21 1200  BP: 129/66 129/66  Pulse: 71 69  Resp:  18 19  Temp: 97.9 F (36.6 C)   SpO2: 92% 94%    General: Appears in no acute distress Cardiovascular: S1-S2, regular, no murmur auscultated Respiratory: Clear to auscultation bilaterally  Discharge Instructions   Discharge Instructions     Diet - low sodium heart healthy   Complete by: As directed    Increase activity slowly   Complete by: As directed       Allergies as of 05/08/2021   No Known Allergies      Medication List     TAKE these medications    acetaminophen 650 MG CR tablet Commonly known as: TYLENOL Take 1,300 mg by mouth as needed for pain.   albuterol 108 (90 Base) MCG/ACT inhaler Commonly known as: VENTOLIN HFA Inhale 2 puffs into the lungs every 4 (four) hours as needed for wheezing or shortness of breath.   amLODipine 5 MG tablet Commonly known as: NORVASC Take 1 tablet (5 mg total) by mouth daily.   apixaban 5 MG Tabs tablet Commonly known as: ELIQUIS Take 1 tablet (5 mg total) by mouth 2 (two) times daily.   atorvastatin 20 MG tablet Commonly known as: LIPITOR Take 1 tablet (20 mg total) by mouth daily.   Blood Pressure Monitor Devi Please provide with medically approved xl blood pressure monitoring device. ICD I10.0   furosemide 40 MG tablet Commonly known as: LASIX Take 1 tablet (40 mg total) by mouth daily.   Misc. Devices Misc Please provide insurance approved CPAP nasal mask. ICD 10 G47.33 Z99.89   PRESCRIPTION MEDICATION 1 each by Other route at bedtime. CPAP- At bedtime       No Known Allergies    The results of significant  diagnostics from this hospitalization (including imaging, microbiology, ancillary and laboratory) are listed below for reference.    Significant Diagnostic Studies: CT Angio Chest PE W and/or Wo Contrast  Result Date: 05/01/2021 CLINICAL DATA:  Evaluate for pulmonary embolus EXAM: CT ANGIOGRAPHY CHEST WITH CONTRAST TECHNIQUE: Multidetector CT imaging of the chest was performed using the  standard protocol during bolus administration of intravenous contrast. Multiplanar CT image reconstructions and MIPs were obtained to evaluate the vascular anatomy. CONTRAST:  43m OMNIPAQUE IOHEXOL 350 MG/ML SOLN COMPARISON:  CT chest dated December 27, 2020 FINDINGS: Cardiovascular: Adequate contrast opacification of the pulmonary arteries. No evidence of pulmonary embolus to the level of the proximal segmental pulmonary arteries. Evaluation of the more distal pulmonary arteries is limited due to artifact related to patient body habitus.Cardiomegaly. No pericardial effusion. No significant coronary artery calcifications. Atherosclerotic disease of the thoracic aorta. Dilated main pulmonary artery, measuring up to 3.9 cm. Mediastinum/Nodes: Mildly enlarged mediastinal lymph nodes. Reference AP window lymph node measuring 1.1 cm on series 8, image 34, likely reactive. Esophagus and thyroid are unremarkable. Lungs/Pleura: Central airways are patent. Background mosaic attenuation. Dependent consolidations of the bilateral lower and upper lobes. Upper Abdomen: No acute abnormality. Musculoskeletal: No chest wall abnormality. No acute or significant osseous findings. Review of the MIP images confirms the above findings. IMPRESSION: No evidence of pulmonary embolus to the level of the proximal segmental pulmonary arteries. Bilateral consolidations of the dependent lungs, differential includes aspiration or multifocal infection. Dilated main pulmonary artery, findings can be seen in the setting of pulmonary hypertension. Background mosaic attenuation which can be seen in the setting of small airways disease or pulmonary hypertension. Aortic Atherosclerosis (ICD10-I70.0). Electronically Signed   By: LYetta GlassmanM.D.   On: 05/01/2021 14:23   DG CHEST PORT 1 VIEW  Result Date: 05/03/2021 CLINICAL DATA:  Dyspnea. EXAM: PORTABLE CHEST 1 VIEW COMPARISON:  April 30, 2021. FINDINGS: Stable cardiomegaly with central  pulmonary vascular congestion. Bibasilar atelectasis or possibly edema is noted. Bony thorax is unremarkable. IMPRESSION: Stable cardiomegaly with central pulmonary vascular congestion. Bibasilar subsegmental atelectasis or edema is noted. Electronically Signed   By: JMarijo ConceptionM.D.   On: 05/03/2021 13:37   DG Chest Portable 1 View  Result Date: 04/30/2021 CLINICAL DATA:  Short of breath EXAM: PORTABLE CHEST 1 VIEW COMPARISON:  12/27/2020 FINDINGS: Stable enlarged cardiac silhouette. There is increase in central venous congestion. Additionally, there is bilateral ground-glass opacities in the lower lobe new from prior. Upper lungs are clear. IMPRESSION: Increased central venous congestion and lower lobe airspace opacities suggest congestive heart failure with pulmonary edema. Electronically Signed   By: SSuzy BouchardM.D.   On: 04/30/2021 18:39   ECHOCARDIOGRAM COMPLETE  Result Date: 05/02/2021    ECHOCARDIOGRAM REPORT   Patient Name:   Aristea Livingston Date of Exam: 05/02/2021 Medical Rec #:  [id number]     Height:       68.0 in Accession #:    [id number]    Weight:       489.0 lb Date of Birth:  [birth year]     BSA:          2.988 m Patient Age:    89years       BP:           119/78 mmHg Patient Gender: F              HR:           63  bpm. Exam Location:  Inpatient Procedure: 2D Echo, Cardiac Doppler, Color Doppler and Intracardiac            Opacification Agent Indications:    CHF-Acute Diastolic Q82.50  History:        Patient has prior history of Echocardiogram examinations, most                 recent 12/28/2020. Signs/Symptoms:Dyspnea; Risk Factors:Sleep                 Apnea.  Sonographer:    Bernadene Person RDCS Referring Phys: [phone] [facility]  Sonographer Comments: Patient is morbidly obese and no subcostal window. IMPRESSIONS  1. Left ventricular ejection fraction, by estimation, is 60 to 65%. The left ventricle has normal function. The left ventricle has no regional wall motion  abnormalities. There is mild left ventricular hypertrophy. Left ventricular diastolic parameters are indeterminate.  2. Right ventricular systolic function is normal. The right ventricular size is normal. There is normal pulmonary artery systolic pressure.  3. The mitral valve is normal in structure. No evidence of mitral valve regurgitation. No evidence of mitral stenosis.  4. The aortic valve is tricuspid. Aortic valve regurgitation is not visualized. No aortic stenosis is present.  5. Aortic dilatation noted. There is borderline dilatation of the aortic root, measuring 37 mm.  6. Technically difficult study with poor acoustic windows. FINDINGS  Left Ventricle: Left ventricular ejection fraction, by estimation, is 60 to 65%. The left ventricle has normal function. The left ventricle has no regional wall motion abnormalities. Definity contrast agent was given IV to delineate the left ventricular  endocardial borders. The left ventricular internal cavity size was normal in size. There is mild left ventricular hypertrophy. Left ventricular diastolic parameters are indeterminate. Right Ventricle: The right ventricular size is normal. No increase in right ventricular wall thickness. Right ventricular systolic function is normal. There is normal pulmonary artery systolic pressure. The tricuspid regurgitant velocity is 1.60 m/s, and  with an assumed right atrial pressure of 3 mmHg, the estimated right ventricular systolic pressure is 48.8 mmHg. Left Atrium: Left atrial size was not well visualized. Right Atrium: Right atrial size was not well visualized. Pericardium: There is no evidence of pericardial effusion. Mitral Valve: The mitral valve is normal in structure. No evidence of mitral valve regurgitation. No evidence of mitral valve stenosis. Tricuspid Valve: The tricuspid valve is normal in structure. Tricuspid valve regurgitation is trivial. Aortic Valve: The aortic valve is tricuspid. Aortic valve regurgitation is  not visualized. No aortic stenosis is present. Pulmonic Valve: The pulmonic valve was not well visualized. Pulmonic valve regurgitation is not visualized. Aorta: Aortic dilatation noted. There is borderline dilatation of the aortic root, measuring 37 mm. Venous: The inferior vena cava was not well visualized. IAS/Shunts: The interatrial septum was not well visualized.  LEFT VENTRICLE PLAX 2D LVIDd:         5.00 cm   Diastology LVIDs:         3.20 cm   LV e' medial:    6.98 cm/s LV PW:         1.10 cm   LV E/e' medial:  14.6 LV IVS:        1.20 cm   LV e' lateral:   10.80 cm/s LVOT diam:     2.30 cm   LV E/e' lateral: 9.4 LV SV:         116 LV SV Index:   39 LVOT Area:  4.15 cm  RIGHT VENTRICLE RV S prime:     11.10 cm/s TAPSE (M-mode): 2.4 cm LEFT ATRIUM         Index LA diam:    4.40 cm 1.47 cm/m  AORTIC VALVE LVOT Vmax:   131.00 cm/s LVOT Vmean:  84.200 cm/s LVOT VTI:    0.280 m  AORTA Ao Root diam: 3.70 cm Ao Asc diam:  3.70 cm MITRAL VALVE                TRICUSPID VALVE MV Area (PHT): 4.21 cm     TR Peak grad:   10.2 mmHg MV Decel Time: 180 msec     TR Vmax:        160.00 cm/s MV E velocity: 102.00 cm/s MV A velocity: 95.20 cm/s   SHUNTS MV E/A ratio:  1.07         Systemic VTI:  0.28 m                             Systemic Diam: 2.30 cm Dalton McleanMD Electronically signed by Franki Monte Signature Date/Time: 05/02/2021/3:22:02 PM    Final    [place] EKG SITE RITE  Result Date: 05/01/2021 If Site Rite image not attached, placement could not be confirmed due to current cardiac rhythm.   Microbiology: Recent Results (from the past 240 hour(s))  Resp Panel by RT-PCR (Flu A&B, Covid) Nasopharyngeal Swab     Status: None   Collection Time: 04/30/21  6:21 PM   Specimen: Nasopharyngeal Swab; Nasopharyngeal(NP) swabs in vial transport medium  Result Value Ref Range Status   SARS Coronavirus 2 by RT PCR NEGATIVE NEGATIVE Final    Comment: (NOTE) SARS-CoV-2 target nucleic acids are NOT DETECTED.  The  SARS-CoV-2 RNA is generally detectable in upper respiratory specimens during the acute phase of infection. The lowest concentration of SARS-CoV-2 viral copies this assay can detect is 138 copies/mL. A negative result does not preclude SARS-Cov-2 infection and should not be used as the sole basis for treatment or other patient management decisions. A negative result may occur with  improper specimen collection/handling, submission of specimen other than nasopharyngeal swab, presence of viral mutation(s) within the areas targeted by this assay, and inadequate number of viral copies(<138 copies/mL). A negative result must be combined with clinical observations, patient history, and epidemiological information. The expected result is Negative.  Fact Sheet for Patients:  EntrepreneurPulse.com.au  Fact Sheet for Healthcare Providers:  IncredibleEmployment.be  This test is no t yet approved or cleared by the [place] FDA and  has been authorized for detection and/or diagnosis of SARS-CoV-2 by FDA under an Emergency Use Authorization (EUA). This EUA will remain  in effect (meaning this test can be used) for the duration of the COVID-19 declaration under Section 564(b)(1) of the Act, 21 U.S.C.section 360bbb-3(b)(1), unless the authorization is terminated  or revoked sooner.       Influenza A by PCR NEGATIVE NEGATIVE Final   Influenza B by PCR NEGATIVE NEGATIVE Final    Comment: (NOTE) The Xpert Xpress SARS-CoV-2/FLU/RSV plus assay is intended as an aid in the diagnosis of influenza from Nasopharyngeal swab specimens and should not be used as a sole basis for treatment. Nasal washings and aspirates are unacceptable for Xpert Xpress SARS-CoV-2/FLU/RSV testing.  Fact Sheet for Patients: EntrepreneurPulse.com.au  Fact Sheet for Healthcare Providers: IncredibleEmployment.be  This test is not yet approved or  cleared by the [place] FDA  and has been authorized for detection and/or diagnosis of SARS-CoV-2 by FDA under an Emergency Use Authorization (EUA). This EUA will remain in effect (meaning this test can be used) for the duration of the COVID-19 declaration under Section 564(b)(1) of the Act, 21 U.S.C. section 360bbb-3(b)(1), unless the authorization is terminated or revoked.  Performed at [facility], [place] [address] [place], [place] [address]      Labs: Basic Metabolic Panel: Recent Labs  Lab 05/02/21 0325 05/03/21 0241 05/04/21 0133 05/05/21 0837  NA 140 138 136 136  K 4.7 4.0 3.8 4.4  CL 96* 91* 95* 100  CO2 35* 36* 34* 30  GLUCOSE 74 85 103* 95  BUN 16 21* 25* 29*  CREATININE 0.97 0.99 1.07* 0.98  CALCIUM 8.9 9.0 9.1 9.4   Liver Function Tests: No results for input(s): AST, ALT, ALKPHOS, BILITOT, PROT, ALBUMIN in the last 168 hours. No results for input(s): LIPASE, AMYLASE in the last 168 hours. No results for input(s): AMMONIA in the last 168 hours. CBC: Recent Labs  Lab 05/02/21 0325 05/03/21 0241 05/04/21 0133  WBC 6.0 4.9 5.2  HGB 11.8* 11.6* 12.0  HCT 39.9 38.5 39.8  MCV 98.5 97.0 96.1  PLT 182 187 188   Cardiac Enzymes: No results for input(s): CKTOTAL, CKMB, CKMBINDEX, TROPONINI in the last 168 hours. BNP: BNP (last 3 results) Recent Labs    08/18/20 1832 08/25/20 0433 04/30/21 1821  BNP 30.6 13.2 85.2    ProBNP (last 3 results) No results for input(s): PROBNP in the last 8760 hours.  CBG: No results for input(s): GLUCAP in the last 168 hours.     Signed:  Oswald Hillock MD.  [facility] 05/08/2021, 3:21 PM

## 2021-05-08 NOTE — Plan of Care (Signed)
Pt stable diuresed with PO lasix. Plan to discharge home once trilogy is arranged.  Pt able to verbalize needs. No concerns.   Problem: Health Behavior/Discharge Planning: Goal: Ability to manage health-related needs will improve Outcome: Progressing   Problem: Clinical Measurements: Goal: Ability to maintain clinical measurements within normal limits will improve Outcome: Progressing Goal: Will remain free from infection Outcome: Progressing Goal: Diagnostic test results will improve Outcome: Progressing Goal: Respiratory complications will improve Outcome: Progressing Goal: Cardiovascular complication will be avoided Outcome: Progressing

## 2021-05-08 NOTE — TOC Transition Note (Signed)
Transition of Care [facility]) - CM/SW Discharge Note   Patient Details  Name: Angel Brewer MRN: [id number] Date of Birth: June 12, 1966  Transition of Care [facility]) CM/SW Contact:  [name], RN Phone Number:[phone]  05/08/2021, 3:24 PM   Clinical Narrative:    Patient is discharging home. NIV to be delivered to the home per [facility]. CM has verified delivery with [employer]. NO HH set up patient refused at this time. No other needs noted at this time. TOC will sign off.    Final next level of care: Home/Self Care Barriers to Discharge: No Barriers Identified   Patient Goals and CMS Choice Patient states their goals for this hospitalization and ongoing recovery are:: Ready to go home CMS Medicare.gov Compare Post Acute Care list provided to:: Patient Choice offered to / list presented to : Patient  Discharge Placement                       Discharge Plan and Services                DME Arranged: NIV DME Agency: Other - Comment ([place]) Date DME Agency Contacted: 05/08/21 Time DME Agency Contacted: 87   HH Arranged: NA HH Agency: NA        Social Determinants of Health ([place]) Interventions     Readmission Risk Interventions Readmission Risk Prevention Plan 12/02/2019  Post Dischage Appt Complete  Medication Screening Complete  Transportation Screening Complete  Some recent data might be hidden

## 2021-05-08 NOTE — Progress Notes (Signed)
Discharge orders received. Pt made aware. Supplies delivered to bedside. Patient does not have transportation and assistance to set up when arriving home. She will notify RN once that has been arranged. No further needs at this time.

## 2021-05-08 NOTE — TOC Progression Note (Addendum)
Transition of Care [facility]) - Progression Note    Patient Details  Name: Angel Brewer MRN: [id number] Date of Birth: [birth year]  Transition of Care [facility]) CM/SW Contact  [name], RN Phone Number:[phone]  05/08/2021, 9:07 AM  Clinical Narrative:    CM called [employer] to determine status of trilogy. CM spoke with Darnelle Maffucci who states that insurance Josem Kaufmann is still pending.   1100 CM reached out to [place] who can set up and provide niv today. MD made aware.  1515 CM at bedside to discuss Home Health needs. Patient states that she does not want home health set up because she doesn't  need it. No Home Health set up at this time. TOC will sign off.      Expected Discharge Plan and Services                                                 Social Determinants of Health (SDOH) Interventions    Readmission Risk Interventions Readmission Risk Prevention Plan 12/02/2019  Post Dischage Appt Complete  Medication Screening Complete  Transportation Screening Complete  Some recent data might be hidden

## 2021-05-08 NOTE — Progress Notes (Signed)
Physical Therapy Treatment Patient Details Name: Angel Brewer MRN: [id number] DOB: 1965/09/26 Today's Date: 05/08/2021   History of Present Illness 55yo female admitted 10/18 with a 1.5 week hx of SOB. Pt with acute respiratory failure with hypoxia. PMH HTN, hx recurrent PE on Eliquis, morbid obesity, hx DVT, wrist ligament reconstruction, radial ORIF    PT Comments    Pt demonstrates mod I bed mobility and transfers. Supervision amb 300' with AD. Amb on RA with desat to 87%. Recovery to 92% with pursed lip breathing and standing rest break. Pt in recliner at end of session.    Recommendations for follow up therapy are one component of a multi-disciplinary discharge planning process, led by the attending physician.  Recommendations may be updated based on patient status, additional functional criteria and insurance authorization.  Follow Up Recommendations  Home health PT     Assistance Recommended at Discharge    Equipment Recommendations  None recommended by PT    Recommendations for Other Services       Precautions / Restrictions Precautions Precautions: Fall;Other (comment) Precaution Comments: watch sats     Mobility  Bed Mobility Overal bed mobility: Modified Independent Bed Mobility: Supine to Sit     Supine to sit: Modified independent (Device/Increase time)     General bed mobility comments: +rail, increased time    Transfers Overall transfer level: Modified independent Equipment used: None Transfers: Sit to/from Stand Sit to Stand: Modified independent (Device/Increase time)           General transfer comment: increased time to power up, Pt relying on momentum    Ambulation/Gait Ambulation/Gait assistance: Supervision Gait Distance (Feet): 300 Feet Assistive device: None Gait Pattern/deviations: Step-through pattern;Decreased stride length;Wide base of support Gait velocity: decreased Gait velocity interpretation: <1.31 ft/sec, indicative of  household ambulator General Gait Details: slow, guard gait; wide BOS due to body habitus; Desat to 87% on RA. Cues for pursed lip breathing.   Stairs             Wheelchair Mobility    Modified Rankin (Stroke Patients Only)       Balance Overall balance assessment: Mild deficits observed, not formally tested                                          Cognition Arousal/Alertness: Awake/alert Behavior During Therapy: WFL for tasks assessed/performed Overall Cognitive Status: Within Functional Limits for tasks assessed                                          Exercises      General Comments General comments (skin integrity, edema, etc.): Desat to 87% during amb on RA. Recovery to 92% with pursed lip breathing.      Pertinent Vitals/Pain Pain Assessment: No/denies pain    Home Living                          Prior Function            PT Goals (current goals can now be found in the care plan section) Acute Rehab PT Goals Patient Stated Goal: go home Progress towards PT goals: Progressing toward goals    Frequency    Min 3X/week      PT  Plan Current plan remains appropriate    Co-evaluation              AM-PAC PT "6 Clicks" Mobility   Outcome Measure  Help needed turning from your back to your side while in a flat bed without using bedrails?: None Help needed moving from lying on your back to sitting on the side of a flat bed without using bedrails?: A [place] Help needed moving to and from a bed to a chair (including a wheelchair)?: None Help needed standing up from a chair using your arms (e.g., wheelchair or bedside chair)?: None Help needed to walk in hospital room?: A [place] Help needed climbing 3-5 steps with a railing? : A [place] 6 Click Score: 21    End of Session   Activity Tolerance: Patient tolerated treatment well Patient left: in chair;with call bell/phone within reach Nurse  Communication: Mobility status PT Visit Diagnosis: Muscle weakness (generalized) (M62.81);Difficulty in walking, not elsewhere classified (R26.2);Other abnormalities of gait and mobility (R26.89)     Time: 0761-5183 PT Time Calculation (min) (ACUTE ONLY): 16 min  Charges:  $Gait Training: 8-22 mins                     Lorrin Goodell, PT  Office # [phone] Pager [phone]    Lorriane Shire 05/08/2021, 8:47 AM
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
## 2021-05-09 ENCOUNTER — Other Ambulatory Visit: Payer: Self-pay

## 2021-05-09 NOTE — Patient Outreach (Signed)
[facility]) Care Management  05/09/2021  Britnay Pontiff [birth year] [phone]   Follow up:  Managed Medicaid - Healthy [employer]  Called patient at [phone], HIPAA verified, to follow up for need of high risk care coordination with MM team. Patient states, "I have what I need and don't need the program."  Patient states she has her DME and no other needs at this time. Patient politely declined MM for follow up.  Sign off.  [name], RN BSN [facility] Liaison  [phone] business mobile phone Toll free office [phone]  Fax number: [phone] [email or web address]

## 2021-05-10 ENCOUNTER — Telehealth: Payer: Self-pay

## 2021-05-10 NOTE — Telephone Encounter (Signed)
Transition Care Management Follow-up Telephone Call Date of discharge and from where: 05/09/2021-[facility]  How have you been since you were released from the hospital? Patient stated she is doing fine. Any questions or concerns? No  Items Reviewed: Did the pt receive and understand the discharge instructions provided? Yes  Medications obtained and verified?  No medications given at discharge  Other? No  Any new allergies since your discharge? No  Dietary orders reviewed? No Do you have support at home? Yes   Home Care and Equipment/Supplies: Were home health services ordered? not applicable If so, what is the name of the agency? N/A  Has the agency set up a time to come to the patient's home? not applicable Were any new equipment or medical supplies ordered?  No What is the name of the medical supply agency? N/A Were you able to get the supplies/equipment? not applicable Do you have any questions related to the use of the equipment or supplies? No  Functional Questionnaire: (I = Independent and D = Dependent) ADLs: I  Bathing/Dressing- I  Meal Prep- I  Eating- I  Maintaining continence- I  Transferring/Ambulation- I  Managing Meds- I  Follow up appointments reviewed:  PCP Hospital f/u appt confirmed? No   Specialist Hospital f/u appt confirmed? No   Are transportation arrangements needed? No  If their condition worsens, is the pt aware to call PCP or go to the Emergency Dept.? Yes Was the patient provided with contact information for the PCP's office or ED? Yes Was to pt encouraged to call back with questions or concerns? Yes

## 2021-05-10 NOTE — Telephone Encounter (Signed)
Transition Care Management Follow-up Telephone Call Date of discharge and from where: 05/09/2021-[facility]  How have you been since you were released from the hospital? Patient stated she is doing fine. Any questions or concerns? Pt would like a refill on her Amlodipine prior to the scheduled appt with PCP.   Items Reviewed: Did the pt receive and understand the discharge instructions provided? Yes  Medications obtained and verified?  No medications given at discharge  Other? No  Any new allergies since your discharge? No  Dietary orders reviewed? No Do you have support at home? Yes family    Home Care and Equipment/Supplies: Pt sates was given CPAP machine .   Functional Questionnaire: (I = Independent and D = Dependent) ADLs: I       Follow up appointments reviewed:   PCP Hospital f/u appt confirmed? yes NP Raul Del on 06/10/2021   Specialist Hospital f/u appt confirmed? No   Are transportation arrangements needed? No  If their condition worsens, is the pt aware to call PCP or go to the Emergency Dept.? Yes Was the patient provided with contact information for the PCP's office or ED? Yes Was to pt encouraged to call back with questions or concerns? Yes

## 2021-05-12 ENCOUNTER — Other Ambulatory Visit: Payer: Self-pay | Admitting: Nurse Practitioner

## 2021-05-12 NOTE — Telephone Encounter (Signed)
Patient has 6 months of refills of amlodipine since July  amLODipine (NORVASC) 5 MG tablet (Expired) 5 mg, Daily        Summary: Take 1 tablet (5 mg total) by mouth daily., Starting Tue 02/05/2021, Until Mon 05/06/2021, Normal  Dose, Route, Frequency: 5 mg, Oral, Daily Start: 02/05/2021 End: 05/06/2021 Ord/Sold: 02/05/2021 (O) Pharmacy: [id number] [place], [place] - [address] Report Taking:  Long-term:  Med Dose History Change     Patient Sig: Take 1 tablet (5 mg total) by mouth daily.     Ordered on: 02/05/2021     Authorized by: FGildardo Pounds    Dispense: 90 tablet     Refills: 1 ordered

## 2021-05-28 ENCOUNTER — Telehealth: Payer: Self-pay | Admitting: Nurse Practitioner

## 2021-05-28 ENCOUNTER — Telehealth: Payer: Medicaid Other | Admitting: Nurse Practitioner

## 2021-05-28 NOTE — Telephone Encounter (Signed)
No answer LVM

## 2021-06-08 DIAGNOSIS — E662 Morbid (severe) obesity with alveolar hypoventilation: Secondary | ICD-10-CM | POA: Diagnosis not present

## 2021-06-08 DIAGNOSIS — I270 Primary pulmonary hypertension: Secondary | ICD-10-CM | POA: Diagnosis not present

## 2021-06-10 ENCOUNTER — Inpatient Hospital Stay: Payer: Medicaid Other | Admitting: Nurse Practitioner

## 2021-06-11 ENCOUNTER — Ambulatory Visit: Payer: Medicaid Other | Attending: Nurse Practitioner | Admitting: Nurse Practitioner

## 2021-06-11 ENCOUNTER — Other Ambulatory Visit: Payer: Self-pay

## 2021-07-08 DIAGNOSIS — I270 Primary pulmonary hypertension: Secondary | ICD-10-CM | POA: Diagnosis not present

## 2021-07-08 DIAGNOSIS — E662 Morbid (severe) obesity with alveolar hypoventilation: Secondary | ICD-10-CM | POA: Diagnosis not present

## 2021-08-08 ENCOUNTER — Other Ambulatory Visit: Payer: Self-pay | Admitting: Nurse Practitioner

## 2021-08-08 DIAGNOSIS — E662 Morbid (severe) obesity with alveolar hypoventilation: Secondary | ICD-10-CM | POA: Diagnosis not present

## 2021-08-08 DIAGNOSIS — I10 Essential (primary) hypertension: Secondary | ICD-10-CM

## 2021-08-08 DIAGNOSIS — I270 Primary pulmonary hypertension: Secondary | ICD-10-CM | POA: Diagnosis not present

## 2021-08-08 NOTE — Telephone Encounter (Signed)
Requested medications are due for refill today.  yes  Requested medications are on the active medications list.  yes  Last refill. 02/05/2021  Future visit scheduled.   no  Notes to clinic.  Rx expired 05/06/2021.    Requested Prescriptions  Pending Prescriptions Disp Refills   furosemide (LASIX) 40 MG tablet [Pharmacy Med Name: FUROSEMIDE 40 MG TABLET] 90 tablet 1    Sig: TAKE 1 TABLET BY MOUTH EVERY DAY     Cardiovascular:  Diuretics - Loop Failed - 08/08/2021  5:48 PM      Failed - Last BP in normal range    BP Readings from Last 1 Encounters:  05/08/21 123/90          Failed - Valid encounter within last 6 months    Recent Outpatient Visits           6 months ago Recurrent pulmonary emboli [facility])   [place], Vernia Buff, NP   11 months ago Hospital discharge follow-up   [place], Vernia Buff, NP   1 year ago Chronic deep vein thrombosis (DVT) of femoral vein of left lower extremity ([place])   [place] Elsie Stain, MD   2 years ago Recurrent acute deep vein thrombosis (DVT) of right lower extremity [facility])   [place], [place] W, NP   2 years ago History of pulmonary embolism   [place], [place], MD              Passed - K in normal range and within 360 days    Potassium  Date Value Ref Range Status  05/05/2021 4.4 3.5 - 5.1 mmol/L Final          Passed - Ca in normal range and within 360 days    Calcium  Date Value Ref Range Status  05/05/2021 9.4 8.9 - 10.3 mg/dL Final   Calcium, Ion  Date Value Ref Range Status  05/01/2021 1.17 1.15 - 1.40 mmol/L Final          Passed - Na in normal range and within 360 days    Sodium  Date Value Ref Range Status  05/05/2021 136 135 - 145 mmol/L Final  09/07/2020 142 134 - 144 mmol/L Final          Passed - Cr in normal range  and within 360 days    Creatinine  Date Value Ref Range Status  11/14/2019 0.81 0.44 - 1.00 mg/dL Final   Creat  Date Value Ref Range Status  09/09/2016 0.96 0.50 - 1.05 mg/dL Final    Comment:      For patients > or = 56 years of age: The upper reference limit for Creatinine is approximately 13% higher for people identified as African-American.      Creatinine, Ser  Date Value Ref Range Status  05/05/2021 0.98 0.44 - 1.00 mg/dL Final   Creatinine, POC  Date Value Ref Range Status  02/16/2017 300 mg/dL Final   Creatinine, Urine  Date Value Ref Range Status  11/23/2019 169.75 mg/dL Final    Comment:    Performed at [facility], [place] [address] [place], [place] [address]

## 2021-09-08 DIAGNOSIS — E662 Morbid (severe) obesity with alveolar hypoventilation: Secondary | ICD-10-CM | POA: Diagnosis not present

## 2021-09-08 DIAGNOSIS — I270 Primary pulmonary hypertension: Secondary | ICD-10-CM | POA: Diagnosis not present

## 2021-09-14 ENCOUNTER — Other Ambulatory Visit: Payer: Self-pay | Admitting: Nurse Practitioner

## 2021-09-14 DIAGNOSIS — I10 Essential (primary) hypertension: Secondary | ICD-10-CM

## 2021-09-16 NOTE — Telephone Encounter (Signed)
Requested medications are due for refill today.  yes ? ?Requested medications are on the active medications list.  yes ? ?Last refill. 02/05/2021 #90 1 refill ? ?Future visit scheduled.   no ? ?Notes to clinic.  Rx  written to expire 05/06/2021. Rx is expired. ? ? ? ?Requested Prescriptions  ?Pending Prescriptions Disp Refills  ? furosemide (LASIX) 40 MG tablet [Pharmacy Med Name: FUROSEMIDE 40 MG TABLET] 90 tablet 1  ?  Sig: TAKE 1 TABLET BY MOUTH EVERY DAY  ?  ? Cardiovascular:  Diuretics - Loop Failed - 09/14/2021 10:29 AM  ?  ?  Failed - Last BP in normal range  ?  BP Readings from Last 1 Encounters:  ?05/08/21 123/90  ?  ?  ?  ?  Failed - Valid encounter within last 6 months  ?  Recent Outpatient Visits   ? ?      ? 7 months ago Recurrent pulmonary emboli ([place])  ? [place] [name], NP  ? 1 year ago Hospital discharge follow-up  ? [place], [place] W, NP  ? 1 year ago Chronic deep vein thrombosis (DVT) of femoral vein of left lower extremity ([place])  ? [place] Elsie Stain, MD  ? 2 years ago Recurrent acute deep vein thrombosis (DVT) of right lower extremity ([place])  ? [place], [place] W, NP  ? 2 years ago History of pulmonary embolism  ? [place], Charlane Ferretti, MD  ? ?  ?  ? ?  ?  ?  Passed - K in normal range and within 180 days  ?  Potassium  ?Date Value Ref Range Status  ?05/05/2021 4.4 3.5 - 5.1 mmol/L Final  ?  ?  ?  ?  Passed - Ca in normal range and within 180 days  ?  Calcium  ?Date Value Ref Range Status  ?05/05/2021 9.4 8.9 - 10.3 mg/dL Final  ? ?Calcium, Ion  ?Date Value Ref Range Status  ?05/01/2021 1.17 1.15 - 1.40 mmol/L Final  ?  ?  ?  ?  Passed - Na in normal range and within 180 days  ?  Sodium  ?Date Value Ref Range Status  ?05/05/2021 136 135 - 145 mmol/L Final  ?09/07/2020 142 134 - 144 mmol/L Final  ?  ?   ?  ?  Passed - Cr in normal range and within 180 days  ?  Creatinine  ?Date Value Ref Range Status  ?11/14/2019 0.81 0.44 - 1.00 mg/dL Final  ? ?Creat  ?Date Value Ref Range Status  ?09/09/2016 0.96 0.50 - 1.05 mg/dL Final  ?  Comment:  ?    ?For patients > or = 56 years of age: The upper reference limit for ?Creatinine is approximately 13% higher for people identified as ?African-American. ?  ?  ? ?Creatinine, Ser  ?Date Value Ref Range Status  ?05/05/2021 0.98 0.44 - 1.00 mg/dL Final  ? ?Creatinine, POC  ?Date Value Ref Range Status  ?02/16/2017 300 mg/dL Final  ? ?Creatinine, Urine  ?Date Value Ref Range Status  ?11/23/2019 169.75 mg/dL Final  ?  Comment:  ?  Performed at [facility], [place] [address] [place], [place] [address]  ?  ?  ?  ?  Passed - Cl in normal range and within 180 days  ?  Chloride  ?Date Value  Ref Range Status  ?05/05/2021 100 98 - 111 mmol/L Final  ?  ?  ?  ?  Passed - Mg Level in normal range and within 180 days  ?  Magnesium  ?Date Value Ref Range Status  ?05/01/2021 2.0 1.7 - 2.4 mg/dL Final  ?  Comment:  ?  Performed at [facility], [place] [address] [place], [place] [address]  ?  ?  ?  ?  ?  ?

## 2021-10-06 DIAGNOSIS — I270 Primary pulmonary hypertension: Secondary | ICD-10-CM | POA: Diagnosis not present

## 2021-10-06 DIAGNOSIS — E662 Morbid (severe) obesity with alveolar hypoventilation: Secondary | ICD-10-CM | POA: Diagnosis not present

## 2021-10-09 ENCOUNTER — Other Ambulatory Visit: Payer: Self-pay | Admitting: Nurse Practitioner

## 2021-10-09 DIAGNOSIS — I10 Essential (primary) hypertension: Secondary | ICD-10-CM

## 2021-10-21 ENCOUNTER — Other Ambulatory Visit: Payer: Self-pay | Admitting: Nurse Practitioner

## 2021-10-21 DIAGNOSIS — I10 Essential (primary) hypertension: Secondary | ICD-10-CM

## 2021-10-22 NOTE — Telephone Encounter (Signed)
Requested medications are due for refill today.  yes ? ?Requested medications are on the active medications list.  yes ? ?Last refill. 02/05/2021 #90 1 refill ? ?Future visit scheduled.   yes ? ?Notes to clinic.  Rx is expired 05/06/2021. ? ? ? ?Requested Prescriptions  ?Pending Prescriptions Disp Refills  ? amLODipine (NORVASC) 5 MG tablet [Pharmacy Med Name: AMLODIPINE BESYLATE 5 MG TAB] 90 tablet 1  ?  Sig: Take 1 tablet (5 mg total) by mouth daily.  ?  ? Cardiovascular: Calcium Channel Blockers 2 Failed - 10/21/2021  9:30 PM  ?  ?  Failed - Last BP in normal range  ?  BP Readings from Last 1 Encounters:  ?05/08/21 123/90  ?  ?  ?  ?  Failed - Valid encounter within last 6 months  ?  Recent Outpatient Visits   ? ?      ? 8 months ago Recurrent pulmonary emboli ([place])  ? [place] [name], NP  ? 1 year ago Hospital discharge follow-up  ? [place], [place] W, NP  ? 1 year ago Chronic deep vein thrombosis (DVT) of femoral vein of left lower extremity ([place])  ? [place] Elsie Stain, MD  ? 2 years ago Recurrent acute deep vein thrombosis (DVT) of right lower extremity ([place])  ? [place], [place] W, NP  ? 2 years ago History of pulmonary embolism  ? [place] Rakes, MD  ? ?  ?  ?Future Appointments   ? ?        ? In 2 months [name], NP [place]  ? ?  ? ?  ?  ?  Passed - Last Heart Rate in normal range  ?  Pulse Readings from Last 1 Encounters:  ?05/08/21 76  ?  ?  ?  ?  ?  ?

## 2021-11-06 DIAGNOSIS — E662 Morbid (severe) obesity with alveolar hypoventilation: Secondary | ICD-10-CM | POA: Diagnosis not present

## 2021-11-06 DIAGNOSIS — I270 Primary pulmonary hypertension: Secondary | ICD-10-CM | POA: Diagnosis not present

## 2021-11-21 ENCOUNTER — Encounter (HOSPITAL_COMMUNITY): Payer: Self-pay | Admitting: Internal Medicine

## 2021-11-21 ENCOUNTER — Emergency Department (HOSPITAL_COMMUNITY): Payer: Medicaid Other

## 2021-11-21 ENCOUNTER — Other Ambulatory Visit: Payer: Self-pay

## 2021-11-21 ENCOUNTER — Inpatient Hospital Stay (HOSPITAL_COMMUNITY)
Admission: EM | Admit: 2021-11-21 | Discharge: 2022-01-08 | DRG: 004 | Disposition: A | Discharge status: Skilled Nursing Facility | Payer: Medicaid Other | Attending: Internal Medicine | Admitting: Internal Medicine

## 2021-11-21 DIAGNOSIS — E11649 Type 2 diabetes mellitus with hypoglycemia without coma: Secondary | ICD-10-CM | POA: Diagnosis not present

## 2021-11-21 DIAGNOSIS — G4733 Obstructive sleep apnea (adult) (pediatric): Secondary | ICD-10-CM | POA: Diagnosis not present

## 2021-11-21 DIAGNOSIS — F419 Anxiety disorder, unspecified: Secondary | ICD-10-CM | POA: Diagnosis not present

## 2021-11-21 DIAGNOSIS — J9620 Acute and chronic respiratory failure, unspecified whether with hypoxia or hypercapnia: Secondary | ICD-10-CM | POA: Diagnosis not present

## 2021-11-21 DIAGNOSIS — Z6841 Body Mass Index (BMI) 40.0 and over, adult: Secondary | ICD-10-CM | POA: Diagnosis present

## 2021-11-21 DIAGNOSIS — I82512 Chronic embolism and thrombosis of left femoral vein: Secondary | ICD-10-CM

## 2021-11-21 DIAGNOSIS — Z86711 Personal history of pulmonary embolism: Secondary | ICD-10-CM | POA: Diagnosis present

## 2021-11-21 DIAGNOSIS — E785 Hyperlipidemia, unspecified: Secondary | ICD-10-CM | POA: Diagnosis present

## 2021-11-21 DIAGNOSIS — G9341 Metabolic encephalopathy: Secondary | ICD-10-CM | POA: Diagnosis not present

## 2021-11-21 DIAGNOSIS — J811 Chronic pulmonary edema: Secondary | ICD-10-CM | POA: Diagnosis not present

## 2021-11-21 DIAGNOSIS — I10 Essential (primary) hypertension: Secondary | ICD-10-CM | POA: Diagnosis not present

## 2021-11-21 DIAGNOSIS — Z87891 Personal history of nicotine dependence: Secondary | ICD-10-CM

## 2021-11-21 DIAGNOSIS — E876 Hypokalemia: Secondary | ICD-10-CM | POA: Diagnosis not present

## 2021-11-21 DIAGNOSIS — I5032 Chronic diastolic (congestive) heart failure: Secondary | ICD-10-CM

## 2021-11-21 DIAGNOSIS — I509 Heart failure, unspecified: Secondary | ICD-10-CM | POA: Diagnosis not present

## 2021-11-21 DIAGNOSIS — J9622 Acute and chronic respiratory failure with hypercapnia: Secondary | ICD-10-CM | POA: Diagnosis present

## 2021-11-21 DIAGNOSIS — J9601 Acute respiratory failure with hypoxia: Secondary | ICD-10-CM | POA: Diagnosis not present

## 2021-11-21 DIAGNOSIS — E662 Morbid (severe) obesity with alveolar hypoventilation: Secondary | ICD-10-CM | POA: Diagnosis present

## 2021-11-21 DIAGNOSIS — R001 Bradycardia, unspecified: Secondary | ICD-10-CM | POA: Diagnosis not present

## 2021-11-21 DIAGNOSIS — Z8249 Family history of ischemic heart disease and other diseases of the circulatory system: Secondary | ICD-10-CM

## 2021-11-21 DIAGNOSIS — I5042 Chronic combined systolic (congestive) and diastolic (congestive) heart failure: Secondary | ICD-10-CM | POA: Diagnosis not present

## 2021-11-21 DIAGNOSIS — R1310 Dysphagia, unspecified: Secondary | ICD-10-CM | POA: Diagnosis not present

## 2021-11-21 DIAGNOSIS — I5033 Acute on chronic diastolic (congestive) heart failure: Secondary | ICD-10-CM | POA: Diagnosis present

## 2021-11-21 DIAGNOSIS — E66813 Obesity, class 3: Secondary | ICD-10-CM | POA: Diagnosis present

## 2021-11-21 DIAGNOSIS — Z7901 Long term (current) use of anticoagulants: Secondary | ICD-10-CM | POA: Diagnosis not present

## 2021-11-21 DIAGNOSIS — I959 Hypotension, unspecified: Secondary | ICD-10-CM | POA: Diagnosis not present

## 2021-11-21 DIAGNOSIS — M7989 Other specified soft tissue disorders: Secondary | ICD-10-CM | POA: Diagnosis not present

## 2021-11-21 DIAGNOSIS — G8929 Other chronic pain: Secondary | ICD-10-CM | POA: Diagnosis not present

## 2021-11-21 DIAGNOSIS — J9611 Chronic respiratory failure with hypoxia: Secondary | ICD-10-CM

## 2021-11-21 DIAGNOSIS — I5023 Acute on chronic systolic (congestive) heart failure: Secondary | ICD-10-CM | POA: Diagnosis not present

## 2021-11-21 DIAGNOSIS — E559 Vitamin D deficiency, unspecified: Secondary | ICD-10-CM

## 2021-11-21 DIAGNOSIS — R7989 Other specified abnormal findings of blood chemistry: Secondary | ICD-10-CM | POA: Diagnosis not present

## 2021-11-21 DIAGNOSIS — I272 Pulmonary hypertension, unspecified: Secondary | ICD-10-CM | POA: Diagnosis not present

## 2021-11-21 DIAGNOSIS — I5031 Acute diastolic (congestive) heart failure: Secondary | ICD-10-CM

## 2021-11-21 DIAGNOSIS — Z79899 Other long term (current) drug therapy: Secondary | ICD-10-CM

## 2021-11-21 DIAGNOSIS — I110 Hypertensive heart disease with heart failure: Principal | ICD-10-CM | POA: Diagnosis present

## 2021-11-21 DIAGNOSIS — J9610 Chronic respiratory failure, unspecified whether with hypoxia or hypercapnia: Secondary | ICD-10-CM

## 2021-11-21 DIAGNOSIS — Z86718 Personal history of other venous thrombosis and embolism: Secondary | ICD-10-CM

## 2021-11-21 DIAGNOSIS — J9621 Acute and chronic respiratory failure with hypoxia: Secondary | ICD-10-CM | POA: Diagnosis not present

## 2021-11-21 DIAGNOSIS — D72829 Elevated white blood cell count, unspecified: Secondary | ICD-10-CM | POA: Diagnosis not present

## 2021-11-21 DIAGNOSIS — E8729 Other acidosis: Secondary | ICD-10-CM | POA: Diagnosis not present

## 2021-11-21 DIAGNOSIS — Z930 Tracheostomy status: Secondary | ICD-10-CM

## 2021-11-21 DIAGNOSIS — E871 Hypo-osmolality and hyponatremia: Secondary | ICD-10-CM | POA: Diagnosis not present

## 2021-11-21 DIAGNOSIS — M25562 Pain in left knee: Secondary | ICD-10-CM | POA: Diagnosis not present

## 2021-11-21 DIAGNOSIS — M25561 Pain in right knee: Secondary | ICD-10-CM | POA: Diagnosis not present

## 2021-11-21 DIAGNOSIS — N179 Acute kidney failure, unspecified: Secondary | ICD-10-CM | POA: Diagnosis not present

## 2021-11-21 DIAGNOSIS — B370 Candidal stomatitis: Secondary | ICD-10-CM | POA: Diagnosis not present

## 2021-11-21 DIAGNOSIS — R0602 Shortness of breath: Secondary | ICD-10-CM | POA: Diagnosis not present

## 2021-11-21 DIAGNOSIS — Z20822 Contact with and (suspected) exposure to covid-19: Secondary | ICD-10-CM | POA: Diagnosis not present

## 2021-11-21 DIAGNOSIS — Z781 Physical restraint status: Secondary | ICD-10-CM | POA: Diagnosis not present

## 2021-11-21 HISTORY — DX: Hyperlipidemia, unspecified: E78.5

## 2021-11-21 LAB — CBC WITH DIFFERENTIAL/PLATELET
Abs Immature Granulocytes: 0.02 10*3/uL (ref 0.00–0.07)
Basophils Absolute: 0.1 10*3/uL (ref 0.0–0.1)
Basophils Relative: 1 %
Eosinophils Absolute: 0.1 10*3/uL (ref 0.0–0.5)
Eosinophils Relative: 1 %
HCT: 41.9 % (ref 36.0–46.0)
Hemoglobin: 12.6 g/dL (ref 12.0–15.0)
Immature Granulocytes: 0 %
Lymphocytes Relative: 22 %
Lymphs Abs: 1.2 10*3/uL (ref 0.7–4.0)
MCH: 29.4 pg (ref 26.0–34.0)
MCHC: 30.1 g/dL (ref 30.0–36.0)
MCV: 97.7 fL (ref 80.0–100.0)
Monocytes Absolute: 0.6 10*3/uL (ref 0.1–1.0)
Monocytes Relative: 10 %
Neutro Abs: 3.7 10*3/uL (ref 1.7–7.7)
Neutrophils Relative %: 66 %
Platelets: 210 10*3/uL (ref 150–400)
RBC: 4.29 MIL/uL (ref 3.87–5.11)
RDW: 16.1 % — ABNORMAL HIGH (ref 11.5–15.5)
WBC: 5.5 10*3/uL (ref 4.0–10.5)
nRBC: 2.2 % — ABNORMAL HIGH (ref 0.0–0.2)

## 2021-11-21 LAB — I-STAT VENOUS BLOOD GAS, ED
Acid-Base Excess: 8 mmol/L — ABNORMAL HIGH (ref 0.0–2.0)
Bicarbonate: 35.1 mmol/L — ABNORMAL HIGH (ref 20.0–28.0)
Calcium, Ion: 1.1 mmol/L — ABNORMAL LOW (ref 1.15–1.40)
HCT: 41 % (ref 36.0–46.0)
Hemoglobin: 13.9 g/dL (ref 12.0–15.0)
O2 Saturation: 97 %
Potassium: 4.2 mmol/L (ref 3.5–5.1)
Sodium: 141 mmol/L (ref 135–145)
TCO2: 37 mmol/L — ABNORMAL HIGH (ref 22–32)
pCO2, Ven: 55.9 mmHg (ref 44–60)
pH, Ven: 7.406 (ref 7.25–7.43)
pO2, Ven: 93 mmHg — ABNORMAL HIGH (ref 32–45)

## 2021-11-21 LAB — COMPREHENSIVE METABOLIC PANEL
ALT: 10 U/L (ref 0–44)
AST: 13 U/L — ABNORMAL LOW (ref 15–41)
Albumin: 3.4 g/dL — ABNORMAL LOW (ref 3.5–5.0)
Alkaline Phosphatase: 57 U/L (ref 38–126)
Anion gap: 5 (ref 5–15)
BUN: 12 mg/dL (ref 6–20)
CO2: 30 mmol/L (ref 22–32)
Calcium: 8.8 mg/dL — ABNORMAL LOW (ref 8.9–10.3)
Chloride: 105 mmol/L (ref 98–111)
Creatinine, Ser: 0.95 mg/dL (ref 0.44–1.00)
GFR, Estimated: >60 mL/min (ref >60)
Glucose, Bld: 86 mg/dL (ref 70–99)
Potassium: 4.3 mmol/L (ref 3.5–5.1)
Sodium: 140 mmol/L (ref 135–145)
Total Bilirubin: 0.9 mg/dL (ref 0.3–1.2)
Total Protein: 7.2 g/dL (ref 6.5–8.1)

## 2021-11-21 LAB — BRAIN NATRIURETIC PEPTIDE: B Natriuretic Peptide: 139.2 pg/mL — ABNORMAL HIGH (ref 0.0–100.0)

## 2021-11-21 LAB — TROPONIN I (HIGH SENSITIVITY)
Troponin I (High Sensitivity): 5 ng/L (ref <18)
Troponin I (High Sensitivity): 6 ng/L (ref <18)

## 2021-11-21 LAB — RESP PANEL BY RT-PCR (FLU A&B, COVID) ARPGX2
Influenza A by PCR: NEGATIVE
Influenza B by PCR: NEGATIVE
SARS Coronavirus 2 by RT PCR: NEGATIVE

## 2021-11-21 MED ORDER — ALBUTEROL SULFATE (2.5 MG/3ML) 0.083% IN NEBU: 3.0000 mL | INHALATION_SOLUTION | RESPIRATORY_TRACT | Status: DC | PRN

## 2021-11-21 MED ORDER — ONDANSETRON HCL 4 MG PO TABS: 4.0000 mg | ORAL_TABLET | Freq: Four times a day (QID) | ORAL | Status: DC | PRN

## 2021-11-21 MED ORDER — ACETAMINOPHEN 325 MG PO TABS
650.0000 mg | ORAL_TABLET | Freq: Four times a day (QID) | ORAL | Status: DC | PRN
  Filled 2021-11-21: qty 2

## 2021-11-21 MED ORDER — SODIUM CHLORIDE 0.9% FLUSH
3.0000 mL | Freq: Two times a day (BID) | INTRAVENOUS | Status: DC
  Administered 2021-11-21 – 2021-12-16 (×41): 3 mL via INTRAVENOUS

## 2021-11-21 MED ORDER — SENNOSIDES-DOCUSATE SODIUM 8.6-50 MG PO TABS: 1.0000 | ORAL_TABLET | Freq: Every evening | ORAL | Status: DC | PRN

## 2021-11-21 MED ORDER — ACETAMINOPHEN 650 MG RE SUPP: 650.0000 mg | Freq: Four times a day (QID) | RECTAL | Status: DC | PRN

## 2021-11-21 MED ORDER — ONDANSETRON HCL 4 MG/2ML IJ SOLN
4.0000 mg | Freq: Four times a day (QID) | INTRAMUSCULAR | Status: DC | PRN
  Administered 2021-12-01: 4 mg via INTRAVENOUS
  Filled 2021-11-21: qty 2

## 2021-11-21 MED ORDER — FUROSEMIDE 10 MG/ML IJ SOLN
60.0000 mg | Freq: Once | INTRAMUSCULAR | Status: AC
  Administered 2021-11-21: 60 mg via INTRAVENOUS
  Filled 2021-11-21: qty 6

## 2021-11-21 MED ORDER — AMLODIPINE BESYLATE 5 MG PO TABS
5.0000 mg | ORAL_TABLET | Freq: Every day | ORAL | Status: DC
  Administered 2021-11-22 – 2021-11-23 (×2): 5 mg via ORAL
  Filled 2021-11-21 (×2): qty 1

## 2021-11-21 MED ORDER — FUROSEMIDE 10 MG/ML IJ SOLN
40.0000 mg | Freq: Two times a day (BID) | INTRAMUSCULAR | Status: DC
  Administered 2021-11-22 – 2021-11-24 (×5): 40 mg via INTRAVENOUS
  Filled 2021-11-21 (×5): qty 4

## 2021-11-21 MED ORDER — ATORVASTATIN CALCIUM 10 MG PO TABS
20.0000 mg | ORAL_TABLET | Freq: Every day | ORAL | Status: DC
  Administered 2021-11-22 – 2021-11-23 (×2): 20 mg via ORAL
  Filled 2021-11-21 (×3): qty 2

## 2021-11-21 MED ORDER — APIXABAN 5 MG PO TABS
5.0000 mg | ORAL_TABLET | Freq: Two times a day (BID) | ORAL | Status: DC
  Administered 2021-11-21 – 2021-11-23 (×4): 5 mg via ORAL
  Filled 2021-11-21 (×4): qty 1

## 2021-11-21 NOTE — H&P (Addendum)
?History and Physical  ? ? ?Angel Brewer NAT:[id number] DOB: 06-22-1966 DOA: 11/21/2021 ? ?PCP: [name], NP  ?Patient coming from: Home ? ?I have personally briefly reviewed patient's old medical records in [place] ? ?Chief Complaint: Shortness of breath ? ?HPI: ?Angel Brewer is a 56 y.o. female with medical history significant for HFpEF (last EF 60-65% October 2022), history of recurrent PE on Eliquis, pulmonary hypertension, HTN, HLD, OSA/OHS on CPAP, morbid obesity who presented to the ED for evaluation of dyspnea. ? ?Patient reports 2 days of worsening shortness of breath, exertional dyspnea, orthopnea, and new nonproductive cough.  Symptoms significantly worsened today and she was dyspneic even while at rest.  She states that she has been having difficulty using her CPAP at night due to difficulty breathing this last 2 days.  She reports adherence to Lasix and Eliquis.  She denies any chest pain, subjective fevers, nausea, vomiting, abdominal pain, dysuria.  She has seen some increased swelling in her legs. ? ?ED Course  Labs/Imaging on admission: I have personally reviewed following labs and imaging studies. ? ?Initial vitals showed BP 144/83, pulse 78, RR 34, temp 99.2 ?F, SPO2 92% on 4 L O2 via [place].  Per ED triage documentation, SPO2 was 60% while on room air. ? ?Labs showed WBC 5.5, hemoglobin 12.6, platelets 210,000, sodium 140, potassium 4.3, bicarb 30, BUN of 12, creatinine 0.95, serum glucose 86, BNP 139.2, troponin 5. ? ?SARS-CoV-2 and influenza PCR negative. ? ?Pulm chest x-ray shows moderate severity increased interstitial lung markings/pulmonary edema. ? ?Patient was given IV Lasix 60 mg.  The hospitalist service was consulted to admit for further evaluation and management. ? ?Review of Systems: All systems reviewed and are negative except as documented in history of present illness above. ? ? ?Past Medical History:  ?Diagnosis Date  ? DVT (deep vein thrombosis) in pregnancy   ?  RLE DVT 02/2016  ? Dyspnea   ? Morbid obesity ([place])   ? PE (pulmonary embolism) 02/29/2016  ? Sleep apnea   ? uses a cpap  ? ? ?Past Surgical History:  ?Procedure Laterality Date  ? CESAREAN SECTION    ? x 3  ? OPEN REDUCTION INTERNAL FIXATION (ORIF) DISTAL RADIAL FRACTURE Right 06/20/2019  ? Procedure: OPEN REDUCTION INTERNAL FIXATION (ORIF) DISTAL RADIAL FRACTURE;  Surgeon: [name], MD;  Location: [facility];  Service: Orthopedics;  Laterality: Right;  REGIONAL BLOCK ?TOTAL SURGERY TIME: 90 MINUTES  ? SYNOVECTOMY Right 11/21/2019  ? Procedure: RIGHT WRIST LIGAMENT RECONSTRUCTION;  Surgeon: [name], MD;  Location: [facility];  Service: Orthopedics;  Laterality: Right;  PROCEDURE: RIGHT WRIST LIGAMENT RECONSTRUCTION ?LENGTH OF SURGERY: 105 MIN  ? ? ?Social History: ? reports that she has quit smoking. She has never used smokeless tobacco. She reports that she does not drink alcohol and does not use drugs. ? ?No Known Allergies ? ?Family History  ?Problem Relation Age of Onset  ? Hypertension Sister   ? ? ? ?Prior to Admission medications   ?Medication Sig Start Date End Date Taking? Authorizing Provider  ?amLODipine (NORVASC) 5 MG tablet TAKE 1 TABLET (5 MG TOTAL) BY MOUTH DAILY. 10/23/21 01/21/22  Charlott Rakes, MD  ?acetaminophen (TYLENOL) 650 MG CR tablet Take 1,300 mg by mouth as needed for pain.    [provider]  ?albuterol (VENTOLIN HFA) 108 (90 Base) MCG/ACT inhaler Inhale 2 puffs into the lungs every 4 (four) hours as needed for wheezing or shortness of breath. 02/05/21   Raul Del,  Vernia Buff, NP  ?apixaban (ELIQUIS) 5 MG TABS tablet Take 1 tablet (5 mg total) by mouth 2 (two) times daily. 02/05/21 05/06/21  [name], NP  ?atorvastatin (LIPITOR) 20 MG tablet TAKE 1 TABLET BY MOUTH EVERY DAY 10/09/21   Charlott Rakes, MD  ?Blood Pressure Monitor DEVI Please provide with medically approved xl blood pressure monitoring device. ICD I10.0 02/05/21   [name], NP  ?furosemide (LASIX) 40 MG  tablet TAKE 1 TABLET BY MOUTH EVERY DAY 10/09/21   Charlott Rakes, MD  ?Misc. Devices MISC Please provide insurance approved CPAP nasal mask. ICD 10 G47.33 Z99.89 02/05/21   [name], NP  ?PRESCRIPTION MEDICATION 1 each by Other route at bedtime. CPAP- At bedtime    [provider]  ? ? ?Physical Exam: ?Vitals:  ? 11/21/21 1806 11/21/21 1810 11/21/21 1950 11/21/21 2027  ?BP: (!) 144/83   124/73  ?Pulse: 78   70  ?Resp: (!) 34   20  ?Temp: 99.2 ?F (37.3 ?C)     ?TempSrc: Oral     ?SpO2: (!) 61% 92% 91% 99%  ? ?Constitutional: Obese woman resting in bed with head slightly elevated, NAD, calm, comfortable ?Eyes: EOMI, lids and conjunctivae normal ?ENMT: Mucous membranes are moist. Posterior pharynx clear of any exudate or lesions.Normal dentition.  ?Neck: normal, supple, no masses. ?Respiratory: Distant breath sounds. Normal respiratory effort while on 4 L O2 via [place]. No accessory muscle use.  ?Cardiovascular: Regular rate and rhythm, no murmurs / rubs / gallops.  Trace bilateral lower extremity edema. 2+ pedal pulses. ?Abdomen: no tenderness, no masses palpated.  ?Musculoskeletal: no clubbing / cyanosis. No joint deformity upper and lower extremities. Good ROM, no contractures. Normal muscle tone.  ?Skin: no rashes, lesions, ulcers. No induration ?Neurologic: Sensation intact. Strength 5/5 in all 4.  ?Psychiatric: Normal judgment and insight. Alert and oriented x 3. Normal mood.  ? ?EKG: Personally reviewed.  Normal sinus rhythm without acute ischemic changes. Not significantly changed when compared to prior. ? ?Assessment/Plan ?Principal Problem: ?  Acute on chronic heart failure with preserved ejection fraction (HFpEF) ([place]) ?Active Problems: ?  Acute respiratory failure with hypoxia ([place]) ?  Obesity hypoventilation syndrome ([place]) ?  History of pulmonary embolus (PE) ?  Essential hypertension ?  Hyperlipidemia ?  ?Angel Brewer is a 56 y.o. female with medical history significant for HFpEF (last EF  60-65% October 2022), history of recurrent PE on Eliquis, pulmonary hypertension, HTN, HLD, OSA/OHS on CPAP, morbid obesity who is admitted with acute hypoxic respiratory failure due to acute on chronic HFpEF. ? ?Assessment and Plan: ?* Acute on chronic heart failure with preserved ejection fraction (HFpEF) ([place]) ?Presenting with progressive exertional dyspnea, orthopnea, lower extremity swelling, hypoxia with new supplemental O2 requirement.  CXR shows pulmonary edema.  TTE 05/02/2021 showed EF 60-65%, indeterminate diastolic parameters.  Receiving IV Lasix 60 mg in the ED. ?-Continue IV Lasix 40 mg twice daily ?-Update echocardiogram ?-Monitor strict I/O's and daily weights ?-Supplemental oxygen as needed ? ?Acute respiratory failure with hypoxia ([place]) ?SPO2 60% on room air per ED triage documentation.  Currently requiring 4 L O2 via [place] to maintain SPO2 >92%.  Not requiring supplemental O2 at home at baseline. ?-Continue diuresis as above ?-Wean supplemental O2 as able ? ?Obesity hypoventilation syndrome ([place]) ?Use BiPAP nightly.  Has been using CPAP trilogy at home. ? ?History of pulmonary embolus (PE) ?History of recurrent PE.  Continue Eliquis. ? ?Hyperlipidemia ?Continue atorvastatin. ? ?Essential hypertension ?Continue amlodipine. ? ?  DVT prophylaxis:  ?apixaban (ELIQUIS) tablet 5 mg   ?Code Status: Full code, confirmed on admission. ?Family Communication: Discussed with patient, she has discussed with family. ?Disposition Plan: From home and likely discharge to home pending clinical progress. ?Consults called: None ?Severity of Illness: ?The appropriate patient status for this patient is INPATIENT. Inpatient status is judged to be reasonable and necessary in order to provide the required intensity of service to ensure the patient's safety. The patient's presenting symptoms, physical exam findings, and initial radiographic and laboratory data in the context of their chronic comorbidities is felt to place them  at high risk for further clinical deterioration. Furthermore, it is not anticipated that the patient will be medically stable for discharge from the hospital within 2 midnights of admission.  ? ?* I certi

## 2021-11-21 NOTE — ED Triage Notes (Signed)
Pt presents for eval of shob Hx of blood clots. Takes eliquis  Has not missed any doses.  Room air sats at time of triage were in the 60s.  Placed on 6L [place] and then weaned down to 4L [place] with sats in the low 90s.   ?

## 2021-11-21 NOTE — Progress Notes (Signed)
Pt's oxygen saturation desat to 64% while on nasal cannula 7L. Respiratory called to place pt on BiPap. Pt placed on [place] 7L; oxygen saturation 90%. Respiratory at bedside. Call bell within reach. Pt resting without distress. Pt denies any further questions or concerns at this time.  ?

## 2021-11-21 NOTE — Assessment & Plan Note (Signed)
SPO2 60% on room air per ED triage documentation.  Currently requiring 4 L O2 via [place] to maintain SPO2 >92%.  Not requiring supplemental O2 at home at baseline. ?-Continue diuresis as above ?-Wean supplemental O2 as able ?

## 2021-11-21 NOTE — Assessment & Plan Note (Signed)
History of recurrent PE.  Continue Eliquis. ?

## 2021-11-21 NOTE — Assessment & Plan Note (Deleted)
Presenting with progressive exertional dyspnea, orthopnea, lower extremity swelling, hypoxia with new supplemental O2 requirement.  CXR shows pulmonary edema.  TTE 05/02/2021 showed EF 60-65%, indeterminate diastolic parameters.  Receiving IV Lasix 60 mg in the ED. -Continue IV Lasix 40 mg twice daily -Update echocardiogram -Monitor strict I/O's and daily weights -Supplemental oxygen as needed

## 2021-11-21 NOTE — ED Notes (Signed)
Attempted IV access x2.  ?

## 2021-11-21 NOTE — ED Provider Triage Note (Signed)
Emergency Medicine Provider Triage Evaluation Note ? ?Angel Brewer , a 56 y.o. female  was evaluated in triage.  Pt complains of shortness of breath.  History of DVT and PE.  Denies COPD, asthma, CHF.  Currently satting 60s on room air.  Improved to low 90s on about 4 L nasal cannula.  Denies recent flulike symptoms. ? ?Review of Systems  ?Positive: As above ?Negative: As above ? ?Physical Exam  ?BP (!) 144/83 (BP Location: Left Arm)   Pulse 78   Temp 99.2 ?F (37.3 ?C) (Oral)   Resp (!) 34   LMP 11/29/2015   SpO2 92%  ?Gen:   Awake, no distress   ?Resp:  Normal effort  ?MSK:   Moves extremities without difficulty  ?Other:   ? ?Medical Decision Making  ?Medically screening exam initiated at 6:13 PM.  Appropriate orders placed.  Angel Brewer was informed that the remainder of the evaluation will be completed by another provider, this initial triage assessment does not replace that evaluation, and the importance of remaining in the ED until their evaluation is complete. ? ? ?  ?Evlyn Courier, PA-C ?11/21/21 1814 ? ?

## 2021-11-21 NOTE — Hospital Course (Addendum)
Mrs. Carranco was admitted to the hospital with the working diagnosis of acute on chronic diastolic heart failure, complicated with acute hypoxemic respiratory failure.   Netty Volden is a 56 y.o. female with medical history significant for HFpEF (last EF 60-65% October 2022), history of recurrent PE on Eliquis, pulmonary hypertension, HTN, HLD, OSA/OHS on CPAP, obesity class 3. Reported 2 days of worsening dyspnea, orthopnea and cough. On her initial physical examination her blood pressure was 144/83, HR 78, RR 34, 02 saturation 94% on 4 L/min per [place]. Distant breath sounds, with no wheezing, heart with S1 and S2 present and rhythmic, abdomen not distended, positive lower extremity edema.   Chest radiograph with cardiomegaly, bilateral interstitial infiltrates, congested hilar vasculature and cephalization of the vasculature.   5/11 initial admission to hospitalist service.  Started on BiPAP and diuresed successfully for 5 L. 5/13 increasing somnolence and hypercarbia new since admission. 5/15 am intubated. Discussed trach with son who wanted to give her a chance of extubation before proceeding straight to trach 5/16 failed SBT for tachypnea 5/17 failed SBT for tachypnea 5/18 failed SBT for rapid and shallow breathing despite high PS 5/19 tracheostomy placed 5/24 Placed on trach collar mid morning of 5/23 and has remained on ATC since  5/25 stable on TC  Transferred from PCCM to [facility] service 5/27  Patient is medically stable to be transfer to rehab. She still has cuffed trach that probably need to be changed to uncuffed before discharge. Follow up with pulmonary recommendations.

## 2021-11-21 NOTE — ED Provider Notes (Signed)
?[facility] ?Provider Note ? ? ?CSN: [id number] ?Arrival date & time: 11/21/21  1747 ? ?  ? ?History ? ?Chief Complaint  ?Patient presents with  ? Shortness of Breath  ? ? ?Angel Brewer is a 56 y.o. female. ? ? Pt is a 56y/o female with hx of hypertension, history of recurrent PE on Eliquis, pulmonary hypertension, morbid obesity with BMI of 63, prior hx of fluid overload but preserved EF who is presenting today with complaint of shortness of breath.  Patient reports over the last 2 or 3 days she did notice being winded with exertion with orthopnea at night but today has been severe.  She has felt short of breath all day even at rest and sitting upright she cannot catch her breath.  She has had a cough over the last 3 days its been nonproductive.  She reports using no inhalers at home right now.  She denies a history of tobacco use, asthma or COPD.  She does report that she still taking her Lasix 40 mg in the morning and has not increased it since she has noticed more shortness of breath and more swelling in her legs.  She denies eating significantly salty meals.  She is still taking her Eliquis as prescribed. ? ?The history is provided by the patient and medical records.  ?Shortness of Breath ? ?  ? ?Home Medications ?Prior to Admission medications   ?Medication Sig Start Date End Date Taking? Authorizing Provider  ?amLODipine (NORVASC) 5 MG tablet TAKE 1 TABLET (5 MG TOTAL) BY MOUTH DAILY. 10/23/21 01/21/22  Charlott Rakes, MD  ?acetaminophen (TYLENOL) 650 MG CR tablet Take 1,300 mg by mouth as needed for pain.    [provider]  ?albuterol (VENTOLIN HFA) 108 (90 Base) MCG/ACT inhaler Inhale 2 puffs into the lungs every 4 (four) hours as needed for wheezing or shortness of breath. 02/05/21   [name], NP  ?apixaban (ELIQUIS) 5 MG TABS tablet Take 1 tablet (5 mg total) by mouth 2 (two) times daily. 02/05/21 05/06/21  [name], NP  ?atorvastatin  (LIPITOR) 20 MG tablet TAKE 1 TABLET BY MOUTH EVERY DAY 10/09/21   Charlott Rakes, MD  ?Blood Pressure Monitor DEVI Please provide with medically approved xl blood pressure monitoring device. ICD I10.0 02/05/21   [name], NP  ?furosemide (LASIX) 40 MG tablet TAKE 1 TABLET BY MOUTH EVERY DAY 10/09/21   Charlott Rakes, MD  ?Misc. Devices MISC Please provide insurance approved CPAP nasal mask. ICD 10 G47.33 Z99.89 02/05/21   [name], NP  ?PRESCRIPTION MEDICATION 1 each by Other route at bedtime. CPAP- At bedtime    [provider]  ?   ? ?Allergies    ?Patient has no known allergies.   ? ?Review of Systems   ?Review of Systems  ?Respiratory:  Positive for shortness of breath.   ? ?Physical Exam ?Updated Vital Signs ?BP 124/73 (BP Location: Left Wrist)   Pulse 70   Temp 99.2 ?F (37.3 ?C) (Oral)   Resp 20   LMP 11/29/2015   SpO2 99%  ?Physical Exam ?Vitals and nursing note reviewed.  ?Constitutional:   ?   General: She is not in acute distress. ?   Appearance: She is well-developed. She is obese.  ?   Comments: Initially upon arrival to the emergency room patient was in distress with oxygen sats of 60% but after being placed on nasal cannula oxygen improved.  Patient is in  no acute distress at this time  ?HENT:  ?   Head: Normocephalic and atraumatic.  ?   Mouth/Throat:  ?   Mouth: Mucous membranes are moist.  ?Eyes:  ?   Pupils: Pupils are equal, round, and reactive to light.  ?Cardiovascular:  ?   Rate and Rhythm: Normal rate and regular rhythm.  ?   Heart sounds: Normal heart sounds. No murmur heard. ?  No friction rub.  ?Pulmonary:  ?   Effort: Pulmonary effort is normal. Tachypnea present.  ?   Breath sounds: Rales present. No wheezing.  ?Abdominal:  ?   General: Bowel sounds are normal. There is no distension.  ?   Palpations: Abdomen is soft.  ?   Tenderness: There is no abdominal tenderness. There is no guarding or rebound.  ?Musculoskeletal:     ?   General: No tenderness. Normal  range of motion.  ?   Right lower leg: Edema present.  ?   Left lower leg: Edema present.  ?   Comments: No edema  ?Skin: ?   General: Skin is warm and dry.  ?   Findings: No rash.  ?Neurological:  ?   Mental Status: She is alert and oriented to person, place, and time. Mental status is at baseline.  ?   Cranial Nerves: No cranial nerve deficit.  ?Psychiatric:     ?   Mood and Affect: Mood normal.     ?   Behavior: Behavior normal.  ? ? ?ED Results / Procedures / Treatments   ?Labs ?(all labs ordered are listed, but only abnormal results are displayed) ?Labs Reviewed  ?CBC WITH DIFFERENTIAL/PLATELET - Abnormal; Notable for the following components:  ?    Result Value  ? RDW 16.1 (*)   ? nRBC 2.2 (*)   ? All other components within normal limits  ?COMPREHENSIVE METABOLIC PANEL - Abnormal; Notable for the following components:  ? Calcium 8.8 (*)   ? Albumin 3.4 (*)   ? AST 13 (*)   ? All other components within normal limits  ?BRAIN NATRIURETIC PEPTIDE - Abnormal; Notable for the following components:  ? B Natriuretic Peptide 139.2 (*)   ? All other components within normal limits  ?I-STAT VENOUS BLOOD GAS, ED - Abnormal; Notable for the following components:  ? pO2, Ven 93 (*)   ? Bicarbonate 35.1 (*)   ? TCO2 37 (*)   ? Acid-Base Excess 8.0 (*)   ? Calcium, Ion 1.10 (*)   ? All other components within normal limits  ?RESP PANEL BY RT-PCR (FLU A&B, COVID) [id number]  ?TROPONIN I (HIGH SENSITIVITY)  ?TROPONIN I (HIGH SENSITIVITY)  ? ? ?EKG ?None ? ?Radiology ?DG Chest 1 View ? ?Result Date: 11/21/2021 ?CLINICAL DATA:  Shortness of breath. EXAM: CHEST  1 VIEW COMPARISON:  May 03, 2021 FINDINGS: The cardiac silhouette is moderately enlarged and unchanged in size. Moderate severity diffusely increased interstitial lung markings are seen with prominence of the pulmonary vasculature. There is no evidence of a pleural effusion or pneumothorax. The visualized skeletal structures are unremarkable. IMPRESSION: Stable  cardiomegaly with moderate to severe congestive heart failure and pulmonary edema. Electronically Signed   By: Virgina Norfolk M.D.   On: 11/21/2021 18:57   ? ?Procedures ?Procedures  ? ? ?Medications Ordered in ED ?Medications  ?furosemide (LASIX) injection 60 mg (has no administration in time range)  ? ? ?ED Course/ Medical Decision Making/ A&P ?  ?                        ?  Medical Decision Making ?Risk ?Prescription drug management. ? ? ?Pt with multiple medical problems and comorbidities and presenting today with a complaint that caries a high risk for morbidity and mortality.  Presenting today with hypoxia and complaints of shortness of breath.  Patient does have a prior history of hypoventilation syndrome with hypercarbia but today she is awake and alert.  Initially was hypoxic upon arrival but has improved on 4 L of oxygen and oxygen saturation between 92 and 94%.  Patient does appear tachypneic and does have rales bilaterally.  She denies any infectious symptoms has not had a productive cough denies any nausea or vomiting.  She does report that she has had a Geisler bit of chest pain just which she describes as soreness with coughing.  I independently interpreted patient's EKG and labs today.  Labs show a venous blood gas with no evidence of hypercarbia and normal pH, CBC without acute findings, CMP with normal renal function and electrolytes, BNP mildly elevated at 139 and troponin negative.  COVID and flu are negative. I have independently visualized and interpreted pt's images today.  Chest x-ray showing cardiomegaly and significant pulmonary edema.  Radiology reports evidence of congestive heart failure.  Given patient's new oxygen requirement, x-ray findings feel that she will need diuresis and admission.  Low suspicion at this time for PE as patient is still taking her Eliquis and has not missed any doses.  Patient reports that she is still taking her Lasix as well and unclear why the exacerbation.   Patient given IV Lasix.  Hospitalist team consulted for admission. ? ?CRITICAL CARE ?Performed by: Skyler Dusing ?Total critical care time: 30 minutes ?Critical care time was exclusive of separately billable procedures and t

## 2021-11-21 NOTE — Assessment & Plan Note (Signed)
Continue atorvastatin

## 2021-11-21 NOTE — Assessment & Plan Note (Addendum)
Patient sp trach.  #6 [place] trach, cuffed in place.  Continue trach care.  One way valve in place, patient tolerating well.

## 2021-11-21 NOTE — Assessment & Plan Note (Addendum)
Currently antihypertensive medications on hold.

## 2021-11-22 ENCOUNTER — Inpatient Hospital Stay (HOSPITAL_COMMUNITY): Payer: Medicaid Other

## 2021-11-22 DIAGNOSIS — I5033 Acute on chronic diastolic (congestive) heart failure: Secondary | ICD-10-CM

## 2021-11-22 LAB — ECHOCARDIOGRAM COMPLETE
AR max vel: 3.06 cm2
AV Area VTI: 3.31 cm2
AV Area mean vel: 3.01 cm2
AV Mean grad: 10 mmHg
AV Peak grad: 19.4 mmHg
Ao pk vel: 2.2 m/s
Area-P 1/2: 5.38 cm2
Height: 68 in
S' Lateral: 3.25 cm
Weight: 6824 oz

## 2021-11-22 LAB — BASIC METABOLIC PANEL
Anion gap: 10 (ref 5–15)
BUN: 11 mg/dL (ref 6–20)
CO2: 29 mmol/L (ref 22–32)
Calcium: 8.7 mg/dL — ABNORMAL LOW (ref 8.9–10.3)
Chloride: 100 mmol/L (ref 98–111)
Creatinine, Ser: 1.13 mg/dL — ABNORMAL HIGH (ref 0.44–1.00)
GFR, Estimated: 57 mL/min — ABNORMAL LOW (ref >60)
Glucose, Bld: 80 mg/dL (ref 70–99)
Potassium: 4.4 mmol/L (ref 3.5–5.1)
Sodium: 139 mmol/L (ref 135–145)

## 2021-11-22 LAB — CBC
HCT: 45.1 % (ref 36.0–46.0)
Hemoglobin: 12.8 g/dL (ref 12.0–15.0)
MCH: 28.5 pg (ref 26.0–34.0)
MCHC: 28.4 g/dL — ABNORMAL LOW (ref 30.0–36.0)
MCV: 100.4 fL — ABNORMAL HIGH (ref 80.0–100.0)
Platelets: 189 10*3/uL (ref 150–400)
RBC: 4.49 MIL/uL (ref 3.87–5.11)
RDW: 15.7 % — ABNORMAL HIGH (ref 11.5–15.5)
WBC: 6.2 10*3/uL (ref 4.0–10.5)
nRBC: 1.4 % — ABNORMAL HIGH (ref 0.0–0.2)

## 2021-11-22 LAB — HIV ANTIBODY (ROUTINE TESTING W REFLEX): HIV Screen 4th Generation wRfx: NONREACTIVE

## 2021-11-22 LAB — MAGNESIUM: Magnesium: 2.2 mg/dL (ref 1.7–2.4)

## 2021-11-22 MED ORDER — PERFLUTREN LIPID MICROSPHERE
1.0000 mL | INTRAVENOUS | Status: AC | PRN
  Administered 2021-11-22: 3 mL via INTRAVENOUS
  Filled 2021-11-22: qty 10

## 2021-11-22 NOTE — Progress Notes (Signed)
?PROGRESS NOTE ? ?Angel Brewer WNU:[id number] DOB: [birth year] DOA: 11/21/2021 ?PCP: [name], NP ? ? LOS: 1 day  ? ?Brief Narrative / Interim history: ?56 year old female with history of diastolic CHF, PE on Eliquis, pulmonary hypertension, hypertension, hyperlipidemia, OSA/OHS on trilogy CPAP, morbid obesity with a BMI of 64 who comes to the hospital with shortness of breath.  She reports that this has been going on for the past 3 to 4 days, has noticed worsening lower extremity swelling, unable to lay flat because she gets short of breath and short of breath with exertion.  She also reports some weight gain in the last week but unable to quantify how much.  Denies any chest pain, denies any palpitations.  No abdominal pain, no nausea or vomiting.  In the ED chest x-ray showed pulmonary edema.  She was placed on diuretics and admitted to the hospital ? ?Subjective / 24h Interval events: ?She is still short of breath this morning.  Denies any chest pain.  Feels uncomfortable in bed ? ?Assesement and Plan: ?Principal Problem: ?  Acute on chronic heart failure with preserved ejection fraction (HFpEF) ([place]) ?Active Problems: ?  Acute respiratory failure with hypoxia ([place]) ?  Obesity hypoventilation syndrome ([place]) ?  History of pulmonary embolus (PE) ?  Essential hypertension ?  Hyperlipidemia ? ?Principal problem ?Acute hypoxic respiratory failure due to acute pulmonary edema due to acute on chronic diastolic CHF -this is likely multifactorial she also has obesity hypoventilation syndrome, OSA.  She is still hypoxic this morning, appears to be on a facemask at 15 L satting 88%.  Continue Lasix, update 2D echo.  Monitor strict ins and outs, daily weights.  She appears to be more than 20 pounds up compared to her prior discharge 7 months ago ? ?Active problems ?Obesity hypoventilation syndrome-continue nightly BiPAP ? ?History of PE-continue Eliquis ? ?Hyperlipidemia-continue atorvastatin ? ?Essential  hypertension-continue amlodipine ? ?Morbid obesity-BMI 64.  There are some evidence of fluid overload but it is clear that she would benefit from significant weight loss moving forward.  I think she should be evaluated in bariatric surgery clinic ? ?Scheduled Meds: ? amLODipine  5 mg Oral Daily  ? apixaban  5 mg Oral BID  ? atorvastatin  20 mg Oral Daily  ? furosemide  40 mg Intravenous Q12H  ? sodium chloride flush  3 mL Intravenous Q12H  ? ?Continuous Infusions: ?PRN Meds:.acetaminophen **OR** acetaminophen, albuterol, ondansetron **OR** ondansetron (ZOFRAN) IV, senna-docusate ? ?Diet Orders (From admission, onward)  ? ?  Start     Ordered  ? 11/21/21 2058  Diet Heart Room service appropriate? Yes; Fluid consistency: Thin  Diet effective now       ?Question Answer Comment  ?Room service appropriate? Yes   ?Fluid consistency: Thin   ?  ? 11/21/21 2057  ? ?  ?  ? ?  ? ? ?DVT prophylaxis: Place TED hose Start: 11/22/21 0833 ?apixaban (ELIQUIS) tablet 5 mg  ? ?Lab Results  ?Component Value Date  ? PLT 189 11/22/2021  ? ? ?  Code Status: Full Code ? ?Family Communication: no family at bedside  ? ?Status is: Inpatient ? ?Remains inpatient appropriate because: Persistent hypoxic respiratory failure ? ? ?Level of care: Telemetry Cardiac ? ?Consultants:  ?none ? ?Procedures:  ?2D echo ? ?Microbiology  ?none ? ?Antimicrobials: ?none  ? ? ?Objective: ?Vitals:  ? 11/22/21 0540 11/22/21 0600 11/22/21 0953 11/22/21 1012  ?BP: 135/74     ?Pulse:      ?  Resp: 19     ?Temp:      ?TempSrc:      ?SpO2: 100% 99% 95% 96%  ?Weight:  (!) 193.5 kg    ?Height:  _0  (1.727 m)    ? ? ?Intake/Output Summary (Last 24 hours) at 11/22/2021 1051 ?Last data filed at 11/22/2021 1009 ?Gross per 24 hour  ?Intake 120 ml  ?Output 3450 ml  ?Net -3330 ml  ? ?Wt Readings from Last 3 Encounters:  ?11/22/21 (!) 193.5 kg  ?05/07/21 (!) 181.9 kg  ?12/27/20 (!) 188.2 kg  ? ? ?Examination: ? ?Constitutional: NAD ?Eyes: no scleral icterus ?ENMT: Mucous membranes  are moist.  ?Neck: normal, supple ?Respiratory: Very diminished and distant breath sounds.  No wheezing or crackles heard.  Difficult exam due to body habitus ?Cardiovascular: Regular rate and rhythm, no murmurs / rubs / gallops.  2+ lower extremity edema ?Abdomen: non distended, no tenderness. Bowel sounds positive.  Difficult exam due to body habitus ?Musculoskeletal: no clubbing / cyanosis.  ?Skin: no rashes ?Neurologic: No focal deficits ? ?Data Reviewed: I have independently reviewed following labs and imaging studies  ? ?CBC ?Recent Labs  ?Lab 11/21/21 ?1827 11/21/21 ?1846 11/22/21 ?0418  ?WBC 5.5  --  6.2  ?HGB 12.6 13.9 12.8  ?HCT 41.9 41.0 45.1  ?PLT 210  --  189  ?MCV 97.7  --  100.4*  ?MCH 29.4  --  28.5  ?MCHC 30.1  --  28.4*  ?RDW 16.1*  --  15.7*  ?LYMPHSABS 1.2  --   --   ?MONOABS 0.6  --   --   ?EOSABS 0.1  --   --   ?BASOSABS 0.1  --   --   ? ? ?Recent Labs  ?Lab 11/21/21 ?1827 11/21/21 ?1846 11/22/21 ?0418  ?NA 140 141 139  ?K 4.3 4.2 4.4  ?CL 105  --  100  ?CO2 30  --  29  ?GLUCOSE 86  --  80  ?BUN 12  --  11  ?CREATININE 0.95  --  1.13*  ?CALCIUM 8.8*  --  8.7*  ?AST 13*  --   --   ?ALT 10  --   --   ?ALKPHOS 57  --   --   ?BILITOT 0.9  --   --   ?ALBUMIN 3.4*  --   --   ?MG  --   --  2.2  ?BNP 139.2*  --   --   ? ? ?------------------------------------------------------------------------------------------------------------------ ?No results for input(s): CHOL, HDL, LDLCALC, TRIG, CHOLHDL, LDLDIRECT in the last 72 hours. ? ?Lab Results  ?Component Value Date  ? HGBA1C 5.4 06/10/2016  ? ?------------------------------------------------------------------------------------------------------------------ ?No results for input(s): TSH, T4TOTAL, T3FREE, THYROIDAB in the last 72 hours. ? ?Invalid input(s): FREET3 ? ?Cardiac Enzymes ?No results for input(s): CKMB, TROPONINI, MYOGLOBIN in the last 168 hours. ? ?Invalid input(s):  CK ?------------------------------------------------------------------------------------------------------------------ ?   ?Component Value Date/Time  ? BNP 139.2 (H) 11/21/2021 1827  ? ? ?CBG: ?No results for input(s): GLUCAP in the last 168 hours. ? ?Recent Results (from the past 240 hour(s))  ?Resp Panel by RT-PCR (Flu A&B, Covid) Nasopharyngeal Swab     Status: None  ? Collection Time: 11/21/21  6:14 PM  ? Specimen: Nasopharyngeal Swab; Nasopharyngeal(NP) swabs in vial transport medium  ?Result Value Ref Range Status  ? SARS Coronavirus 2 by RT PCR NEGATIVE NEGATIVE Final  ?  Comment: (NOTE) ?SARS-CoV-2 target nucleic acids are NOT DETECTED. ? ?The SARS-CoV-2 RNA is generally detectable  in upper respiratory ?specimens during the acute phase of infection. The lowest ?concentration of SARS-CoV-2 viral copies this assay can detect is ?138 copies/mL. A negative result does not preclude SARS-Cov-2 ?infection and should not be used as the sole basis for treatment or ?other patient management decisions. A negative result may occur with  ?improper specimen collection/handling, submission of specimen other ?than nasopharyngeal swab, presence of viral mutation(s) within the ?areas targeted by this assay, and inadequate number of viral ?copies(<138 copies/mL). A negative result must be combined with ?clinical observations, patient history, and epidemiological ?information. The expected result is Negative. ? ?Fact Sheet for Patients:  ?EntrepreneurPulse.com.au ? ?Fact Sheet for Healthcare Providers:  ?IncredibleEmployment.be ? ?This test is no t yet approved or cleared by the [place] FDA and  ?has been authorized for detection and/or diagnosis of SARS-CoV-2 by ?FDA under an Emergency Use Authorization (EUA). This EUA will remain  ?in effect (meaning this test can be used) for the duration of the ?COVID-19 declaration under Section 564(b)(1) of the Act, 21 ?U.S.C.section  360bbb-3(b)(1), unless the authorization is terminated  ?or revoked sooner.  ? ? ?  ? Influenza A by PCR NEGATIVE NEGATIVE Final  ? Influenza B by PCR NEGATIVE NEGATIVE Final  ?  Comment: (NOTE) ?The Xpert Xpress SARS-CoV-2/FLU/RSV plus assay is intended as an aid ?in the diagnosis of influenza from

## 2021-11-22 NOTE — Plan of Care (Signed)
?  Problem: Education: ?Goal: Ability to verbalize understanding of medication therapies will improve ?Outcome: Progressing ?  ?Problem: Activity: ?Goal: Capacity to carry out activities will improve ?Outcome: Progressing ?  ?Problem: Cardiac: ?Goal: Ability to achieve and maintain adequate cardiopulmonary perfusion will improve ?Outcome: Progressing ?  ?

## 2021-11-22 NOTE — TOC Progression Note (Signed)
Transition of Care (TOC) - Progression Note  ? ? ?Patient Details  ?Name: Angel Brewer ?MRN: [id number] ?Date of Birth: [birth year] ? ?Transition of Care (TOC) CM/SW Contact  ?Zenon Mayo, RN ?Phone Number: ?11/22/2021, 3:26 PM ? ?Clinical Narrative:    ?from home, acute/chronic HF, on NRB, now on bipap at night, conts on IV lasix, on eliquis pta. TOC will continue to follow for dc needs. ? ? ?  ?  ? ?Expected Discharge Plan and Services ?  ?  ?  ?  ?  ?                ?  ?  ?  ?  ?  ?  ?  ?  ?  ?  ? ? ?Social Determinants of Health (SDOH) Interventions ?  ? ?Readmission Risk Interventions ? ?  12/02/2019  ?  1:47 PM  ?Readmission Risk Prevention Plan  ?Post Dischage Appt Complete  ?Medication Screening Complete  ?Transportation Screening Complete  ? ? ?

## 2021-11-22 NOTE — Progress Notes (Signed)
Heart Failure Stewardship Pharmacist Progress Note ? ? ?PCP: [name], NP ?PCP-Cardiologist: None  ? ? ?HPI:  ?56 yo female with PMH of OSA on CPAP, diastolic CHF, HLD, and DVT 2/2 pregnancy 2017, and pulmonary HTN. Presented to ED with progressive dyspnea at rest and on exertion, orthopnea with LEE on exam. CXR with moderate interstitial lung markings indicative of moderate/severe CHF and pulmonary edema. Echo 05/12 with LVEF 60-65%, mild LVH, normal RV - unchanged since 04/2021 Echo. Adherent to PTA furosemide. Started on IV diuretics. ? ?Current HF Medications: ?Diuretic: furosemide 40 mg IV BID ? ?Prior to admission HF Medications: ?Diuretic: furosemide 40 mg daily ? ?Pertinent Lab Values: ?Serum creatinine 1.13 (baseline <1), BUN 11, Potassium 4.4, Sodium 139, BNP 139.2, Magnesium 2.2, A1c 5.4% (2017) ? ?Vital Signs: ?Weight: 426 lbs (admission weight: 433.4 lbs)  ?05/07/2021 discharge weight: 401 lb ?Blood pressure: 130-40s/70s  ?Heart rate: 70s  ?I/O: -750 cc yesterday, 1.35L so far today; net -1.98L ? ?Medication Assistance / Insurance Benefits Check: ?Does the patient have prescription insurance?  Yes ?Type of insurance plan: Managed Medicaid ? ?Outpatient Pharmacy:  ?Prior to admission outpatient [facility] ?Is the patient willing to use [facility] at discharge? Pending ?Is the patient willing to transition their outpatient pharmacy to utilize a [facility] outpatient pharmacy?   Pending ?  ? ?Assessment: ?1. Acute on chronic diastolic CHF (LVEF 50-09%). NYHA class IV symptoms. ?Good response to IV diuresis - down 7 lb since admission ?- Continue furosemide IV 40 mg BID ?  ?Plan: ?1) Medication changes recommended at this time: ?- Stop PTA amlodipine 5 mg daily ?- Start Entresto 24-26 mg twice daily ?- Consider starting Jardiance 10 mg daily over the weekend (eGFR 57, no h/o UTI) ?- Can consider spironolactone 12.5 mg daily before discharge ? ?2) Patient assistance: ?- pending patient  discussion ? ?3)  Education  ?- To be completed prior to discharge ? ?Laurey Arrow, PharmD ?PGY1 Pharmacy Resident ?11/22/2021  8:48 AM ?

## 2021-11-22 NOTE — Progress Notes (Signed)
Heart Failure Navigator Progress Note ? ?Assessed for Heart & Vascular TOC clinic readiness.  ?Patient does not meet criteria, is a [place] cardiology patient. .  ? ? ? ?Earnestine Leys, BSN, RN ?Heart Failure Nurse Navigator ?Secure Chat Only   ?

## 2021-11-22 NOTE — Progress Notes (Signed)
?  Echocardiogram ?2D Echocardiogram has been performed. ? ?Angel Brewer  Angel Brewer ?11/22/2021, 2:15 PM ?

## 2021-11-23 ENCOUNTER — Inpatient Hospital Stay (HOSPITAL_COMMUNITY): Payer: Medicaid Other

## 2021-11-23 DIAGNOSIS — J811 Chronic pulmonary edema: Secondary | ICD-10-CM | POA: Diagnosis not present

## 2021-11-23 DIAGNOSIS — G9341 Metabolic encephalopathy: Secondary | ICD-10-CM | POA: Diagnosis not present

## 2021-11-23 DIAGNOSIS — I5033 Acute on chronic diastolic (congestive) heart failure: Secondary | ICD-10-CM | POA: Diagnosis not present

## 2021-11-23 DIAGNOSIS — I509 Heart failure, unspecified: Secondary | ICD-10-CM | POA: Diagnosis not present

## 2021-11-23 DIAGNOSIS — R0602 Shortness of breath: Secondary | ICD-10-CM | POA: Diagnosis not present

## 2021-11-23 LAB — GLUCOSE, CAPILLARY
Glucose-Capillary: 89 mg/dL (ref 70–99)
Glucose-Capillary: 130 mg/dL — ABNORMAL HIGH (ref 70–99)

## 2021-11-23 LAB — POCT I-STAT 7, (LYTES, BLD GAS, ICA,H+H)
Acid-Base Excess: 13 mmol/L — ABNORMAL HIGH (ref 0.0–2.0)
Acid-Base Excess: 14 mmol/L — ABNORMAL HIGH (ref 0.0–2.0)
Bicarbonate: 46.3 mmol/L — ABNORMAL HIGH (ref 20.0–28.0)
Bicarbonate: 45.7 mmol/L — ABNORMAL HIGH (ref 20.0–28.0)
Calcium, Ion: 1.21 mmol/L (ref 1.15–1.40)
Calcium, Ion: 1.21 mmol/L (ref 1.15–1.40)
HCT: 46 % (ref 36.0–46.0)
HCT: 43 % (ref 36.0–46.0)
Hemoglobin: 15.6 g/dL — ABNORMAL HIGH (ref 12.0–15.0)
Hemoglobin: 14.6 g/dL (ref 12.0–15.0)
O2 Saturation: 93 %
O2 Saturation: 94 %
Patient temperature: 99.8
Patient temperature: 99.3
Potassium: 4.1 mmol/L (ref 3.5–5.1)
Potassium: 4.1 mmol/L (ref 3.5–5.1)
Sodium: 139 mmol/L (ref 135–145)
Sodium: 138 mmol/L (ref 135–145)
TCO2: 49 mmol/L — ABNORMAL HIGH (ref 22–32)
TCO2: 49 mmol/L — ABNORMAL HIGH (ref 22–32)
pCO2 arterial: 109.6 mmHg (ref 32–48)
pCO2 arterial: 97.2 mmHg (ref 32–48)
pH, Arterial: 7.237 — ABNORMAL LOW (ref 7.35–7.45)
pH, Arterial: 7.283 — ABNORMAL LOW (ref 7.35–7.45)
pO2, Arterial: 86 mmHg (ref 83–108)
pO2, Arterial: 89 mmHg (ref 83–108)

## 2021-11-23 LAB — BLOOD GAS, ARTERIAL
Acid-Base Excess: 12.8 mmol/L — ABNORMAL HIGH (ref 0.0–2.0)
Bicarbonate: 44.7 mmol/L — ABNORMAL HIGH (ref 20.0–28.0)
O2 Saturation: 98.7 %
Patient temperature: 37.4
pCO2 arterial: 104 mmHg (ref 32–48)
pH, Arterial: 7.24 — ABNORMAL LOW (ref 7.35–7.45)
pO2, Arterial: 102 mmHg (ref 83–108)

## 2021-11-23 LAB — COMPREHENSIVE METABOLIC PANEL
ALT: 9 U/L (ref 0–44)
AST: 12 U/L — ABNORMAL LOW (ref 15–41)
Albumin: 3.2 g/dL — ABNORMAL LOW (ref 3.5–5.0)
Alkaline Phosphatase: 64 U/L (ref 38–126)
Anion gap: 7 (ref 5–15)
BUN: 10 mg/dL (ref 6–20)
CO2: 38 mmol/L — ABNORMAL HIGH (ref 22–32)
Calcium: 8.8 mg/dL — ABNORMAL LOW (ref 8.9–10.3)
Chloride: 94 mmol/L — ABNORMAL LOW (ref 98–111)
Creatinine, Ser: 0.97 mg/dL (ref 0.44–1.00)
GFR, Estimated: >60 mL/min (ref >60)
Glucose, Bld: 84 mg/dL (ref 70–99)
Potassium: 4.5 mmol/L (ref 3.5–5.1)
Sodium: 139 mmol/L (ref 135–145)
Total Bilirubin: 1.3 mg/dL — ABNORMAL HIGH (ref 0.3–1.2)
Total Protein: 7.3 g/dL (ref 6.5–8.1)

## 2021-11-23 LAB — CBC
HCT: 45.3 % (ref 36.0–46.0)
Hemoglobin: 12.9 g/dL (ref 12.0–15.0)
MCH: 28.7 pg (ref 26.0–34.0)
MCHC: 28.5 g/dL — ABNORMAL LOW (ref 30.0–36.0)
MCV: 100.7 fL — ABNORMAL HIGH (ref 80.0–100.0)
Platelets: 183 10*3/uL (ref 150–400)
RBC: 4.5 MIL/uL (ref 3.87–5.11)
RDW: 14.6 % (ref 11.5–15.5)
WBC: 5.1 10*3/uL (ref 4.0–10.5)
nRBC: 0.8 % — ABNORMAL HIGH (ref 0.0–0.2)

## 2021-11-23 LAB — MAGNESIUM: Magnesium: 2.1 mg/dL (ref 1.7–2.4)

## 2021-11-23 MED ORDER — FENTANYL CITRATE PF 50 MCG/ML IJ SOSY
50.0000 ug | PREFILLED_SYRINGE | INTRAMUSCULAR | Status: DC | PRN
  Administered 2021-11-23: 50 ug via INTRAVENOUS
  Administered 2021-11-23: 100 ug via INTRAVENOUS
  Administered 2021-11-29: 200 ug via INTRAVENOUS
  Filled 2021-11-23 (×2): qty 4
  Filled 2021-11-23: qty 2

## 2021-11-23 MED ORDER — PROPOFOL 1000 MG/100ML IV EMUL
0.0000 ug/kg/min | INTRAVENOUS | Status: DC
  Administered 2021-11-23: 45 ug/kg/min via INTRAVENOUS
  Administered 2021-11-23: 5 ug/kg/min via INTRAVENOUS
  Administered 2021-11-24: 20 ug/kg/min via INTRAVENOUS
  Administered 2021-11-24: 25 ug/kg/min via INTRAVENOUS
  Administered 2021-11-24: 20.028 ug/kg/min via INTRAVENOUS
  Administered 2021-11-24 (×2): 20 ug/kg/min via INTRAVENOUS
  Administered 2021-11-24: 45 ug/kg/min via INTRAVENOUS
  Administered 2021-11-24: 35 ug/kg/min via INTRAVENOUS
  Administered 2021-11-25 – 2021-11-26 (×7): 20 ug/kg/min via INTRAVENOUS
  Filled 2021-11-23 (×11): qty 100
  Filled 2021-11-23 (×2): qty 200
  Filled 2021-11-23 (×2): qty 100

## 2021-11-23 MED ORDER — POLYETHYLENE GLYCOL 3350 17 G PO PACK
17.0000 g | PACK | Freq: Every day | ORAL | Status: DC
  Administered 2021-11-24 – 2021-12-06 (×10): 17 g
  Filled 2021-11-23 (×11): qty 1

## 2021-11-23 MED ORDER — MIDAZOLAM HCL 2 MG/2ML IJ SOLN
INTRAMUSCULAR | Status: AC
  Administered 2021-11-23: 2 mg via INTRAVENOUS
  Filled 2021-11-23: qty 2

## 2021-11-23 MED ORDER — CHLORHEXIDINE GLUCONATE CLOTH 2 % EX PADS
6.0000 | MEDICATED_PAD | Freq: Every day | CUTANEOUS | Status: DC
  Administered 2021-11-23: 6 via TOPICAL

## 2021-11-23 MED ORDER — ETOMIDATE 2 MG/ML IV SOLN
INTRAVENOUS | Status: AC
  Filled 2021-11-23: qty 20

## 2021-11-23 MED ORDER — FENTANYL CITRATE PF 50 MCG/ML IJ SOSY
PREFILLED_SYRINGE | INTRAMUSCULAR | Status: AC
  Administered 2021-11-23: 100 ug via INTRAVENOUS
  Filled 2021-11-23: qty 2

## 2021-11-23 MED ORDER — ROCURONIUM BROMIDE 10 MG/ML (PF) SYRINGE
PREFILLED_SYRINGE | INTRAVENOUS | Status: AC
  Filled 2021-11-23: qty 10

## 2021-11-23 MED ORDER — KETAMINE HCL 50 MG/5ML IJ SOSY
100.0000 mg | PREFILLED_SYRINGE | Freq: Once | INTRAMUSCULAR | Status: AC
Start: 2021-11-23 — End: 2021-11-23
  Filled 2021-11-23: qty 10

## 2021-11-23 MED ORDER — MIDAZOLAM HCL 2 MG/2ML IJ SOLN: 2.0000 mg | Freq: Once | INTRAMUSCULAR | Status: AC

## 2021-11-23 MED ORDER — FENTANYL 2500MCG IN NS 250ML (10MCG/ML) PREMIX INFUSION
50.0000 ug/h | INTRAVENOUS | Status: DC
  Administered 2021-11-23 – 2021-11-27 (×3): 50 ug/h via INTRAVENOUS
  Administered 2021-11-28 – 2021-11-29 (×2): 100 ug/h via INTRAVENOUS
  Administered 2021-11-29: 200 ug/h via INTRAVENOUS
  Filled 2021-11-23 (×6): qty 250

## 2021-11-23 MED ORDER — HEPARIN (PORCINE) 25000 UT/250ML-% IV SOLN
1950.0000 [IU]/h | INTRAVENOUS | Status: DC
  Administered 2021-11-23 – 2021-11-24 (×2): 2000 [IU]/h via INTRAVENOUS
  Administered 2021-11-24: 1950 [IU]/h via INTRAVENOUS
  Filled 2021-11-23 (×3): qty 250

## 2021-11-23 MED ORDER — MIDAZOLAM HCL 2 MG/2ML IJ SOLN
2.0000 mg | INTRAMUSCULAR | Status: AC | PRN
  Administered 2021-11-23 (×3): 2 mg via INTRAVENOUS
  Filled 2021-11-23 (×3): qty 2

## 2021-11-23 MED ORDER — DEXMEDETOMIDINE HCL IN NACL 400 MCG/100ML IV SOLN: 0.4000 ug/kg/h | INTRAVENOUS | Status: DC

## 2021-11-23 MED ORDER — DEXMEDETOMIDINE HCL IN NACL 400 MCG/100ML IV SOLN
0.0000 ug/kg/h | INTRAVENOUS | Status: DC
  Administered 2021-11-23: 1 ug/kg/h via INTRAVENOUS

## 2021-11-23 MED ORDER — SUCCINYLCHOLINE CHLORIDE 200 MG/10ML IV SOSY
PREFILLED_SYRINGE | INTRAVENOUS | Status: AC
  Administered 2021-11-23: 200 mg via INTRAVENOUS
  Filled 2021-11-23: qty 10

## 2021-11-23 MED ORDER — FENTANYL BOLUS VIA INFUSION
50.0000 ug | INTRAVENOUS | Status: DC | PRN
  Administered 2021-11-24 – 2021-11-29 (×11): 50 ug via INTRAVENOUS
  Filled 2021-11-23: qty 100

## 2021-11-23 MED ORDER — SUCCINYLCHOLINE CHLORIDE 200 MG/10ML IV SOSY: 100.0000 mg | PREFILLED_SYRINGE | Freq: Once | INTRAVENOUS | Status: AC

## 2021-11-23 MED ORDER — POLYETHYLENE GLYCOL 3350 17 G PO PACK: 17.0000 g | PACK | Freq: Every day | ORAL | Status: DC

## 2021-11-23 MED ORDER — SODIUM CHLORIDE 0.9 % IV SOLN: INTRAVENOUS | Status: DC | PRN

## 2021-11-23 MED ORDER — MIDAZOLAM HCL 2 MG/2ML IJ SOLN: 2.0000 mg | INTRAMUSCULAR | Status: DC | PRN

## 2021-11-23 MED ORDER — DOCUSATE SODIUM 50 MG/5ML PO LIQD: 100.0000 mg | Freq: Two times a day (BID) | ORAL | Status: DC

## 2021-11-23 MED ORDER — FENTANYL CITRATE PF 50 MCG/ML IJ SOSY: 100.0000 ug | PREFILLED_SYRINGE | Freq: Once | INTRAMUSCULAR | Status: AC

## 2021-11-23 MED ORDER — DEXMEDETOMIDINE HCL IN NACL 400 MCG/100ML IV SOLN
INTRAVENOUS | Status: AC
  Filled 2021-11-23: qty 100

## 2021-11-23 MED ORDER — FENTANYL CITRATE PF 50 MCG/ML IJ SOSY
50.0000 ug | PREFILLED_SYRINGE | INTRAMUSCULAR | Status: DC | PRN
  Administered 2021-11-23 (×2): 50 ug via INTRAVENOUS
  Filled 2021-11-23: qty 1

## 2021-11-23 MED ORDER — KETAMINE HCL 50 MG/5ML IJ SOSY
PREFILLED_SYRINGE | INTRAMUSCULAR | Status: AC
Start: 2021-11-23 — End: 2021-11-23
  Administered 2021-11-23: 100 mg via INTRAVENOUS
  Filled 2021-11-23: qty 5

## 2021-11-23 MED ORDER — DOCUSATE SODIUM 50 MG/5ML PO LIQD
100.0000 mg | Freq: Two times a day (BID) | ORAL | Status: DC
  Administered 2021-11-23 – 2021-12-06 (×22): 100 mg
  Filled 2021-11-23 (×26): qty 10

## 2021-11-23 NOTE — Plan of Care (Signed)
?  Problem: Education: ?Goal: Ability to demonstrate management of disease process will improve ?Outcome: Progressing ?Goal: Ability to verbalize understanding of medication therapies will improve ?Outcome: Progressing ?Goal: Individualized Educational Video(s) ?Outcome: Progressing ?  ?Problem: Activity: ?Goal: Capacity to carry out activities will improve ?Outcome: Progressing ?  ?Problem: Cardiac: ?Goal: Ability to achieve and maintain adequate cardiopulmonary perfusion will improve ?Outcome: Progressing ?  ?Problem: Safety: ?Goal: Non-violent Restraint(s) ?Outcome: Progressing ?  ?

## 2021-11-23 NOTE — Progress Notes (Signed)
ABG collected by RT and sent to lab. Lab notified.  ?

## 2021-11-23 NOTE — Assessment & Plan Note (Signed)
Currently on apixaban. ? ?-Bilateral venous Dopplers to ensure no evidence of acute clot. ?

## 2021-11-23 NOTE — Assessment & Plan Note (Signed)
Increasing somnolence with acute respiratory acidosis.  On BiPAP with reasonable seal but continues to hyperventilate achieving a tidal volume of only 250 mL a minute ventilation of around 5-6 L/min ? ?-To ICU.  Attempt to readjust mask. ?-Place art line and check ABG ?-If remains hypercarbic will need to intubate.  Have discussed with listed point of contact, her niece, who agrees. ?

## 2021-11-23 NOTE — Progress Notes (Addendum)
ABG resulted on ISTAT due to Lab Delay. MD aware, MD made aware of results, RT will continue to monitor. ? ? Latest Reference Range & Units 11/23/21 15:18  ?Sample type  ARTERIAL  ?pH, Arterial 7.35 - 7.45  7.283 (L)  ?pCO2 arterial 32 - 48 mmHg 97.2 (HH)  ?pO2, Arterial 83 - 108 mmHg 89  ?TCO2 22 - 32 mmol/L 49 (H)  ?Acid-Base Excess 0.0 - 2.0 mmol/L 14.0 (H)  ?Bicarbonate 20.0 - 28.0 mmol/L 45.7 (H)  ?O2 Saturation % 94  ?Patient temperature  99.3 F  ?Collection site  RADIAL, ALLEN'S TEST ACCEPTABLE  ? ?

## 2021-11-23 NOTE — Progress Notes (Signed)
Nurse contacted MD about critical lab values. Per nurse @ bedside MD recommendation is to continue BIPAP on current settings.  ?

## 2021-11-23 NOTE — Assessment & Plan Note (Signed)
At risk for prolonged mechanical ventilation. ? ?-May benefit from tracheostomy long-term. ?

## 2021-11-23 NOTE — Progress Notes (Addendum)
Pt status change while attempting standing weight. Oxygen saturation 94% on 7L via nasal cannula. Pt responds to voice; oriented x4; yet lethargic. Pt unable to remain sitting on bedside. Pt began to fall backward with eyes closed. Pt's BUE jittery. BP 119/96; HR 86; BGL 89;T: 99.3 F. RRT called. On-call provider notified. Respiratory at bedside. Pt returned to bed with maximum assist. Bipap reapplied; oxygen saturation 97%. ABG drawn. Pt resting in bed without distress. Call bell within reach. Pt denies any further questions or concerns at this time.  ?

## 2021-11-23 NOTE — Plan of Care (Signed)
?  Problem: Education: ?Goal: Ability to verbalize understanding of medication therapies will improve ?Outcome: Progressing ?  ?Problem: Activity: ?Goal: Capacity to carry out activities will improve ?Outcome: Progressing ?  ?Problem: Cardiac: ?Goal: Ability to achieve and maintain adequate cardiopulmonary perfusion will improve ?Outcome: Progressing ?  ?

## 2021-11-23 NOTE — Progress Notes (Signed)
ANTICOAGULATION CONSULT NOTE - Initial Consult ? ?Pharmacy Consult for heparin ?Indication: pulmonary embolus ? ?No Known Allergies ? ?Patient Measurements: ?Height: _0  (172.7 cm) ?Weight: (!) 188.9 kg (416 lb 7.2 oz) ?IBW/kg (Calculated) : 63.9 ?Heparin Dosing Weight: 114kg ? ?Vital Signs: ?Temp: 99.4 ?F (37.4 ?C) (05/13 1513) ?Temp Source: Axillary (05/13 1513) ?BP: 140/83 (05/13 1200) ?Pulse Rate: 69 (05/13 1513) ? ?Labs: ?Recent Labs  ?  11/21/21 ?1827 11/21/21 ?1846 11/21/21 ?0340 11/22/21 ?0418 11/23/21 ?3524 11/23/21 ?8185 11/23/21 ?1518  ?HGB 12.6   < >  --  12.8 12.9 15.6* 14.6  ?HCT 41.9   < >  --  45.1 45.3 46.0 43.0  ?PLT 210  --   --  189 183  --   --   ?CREATININE 0.95  --   --  1.13* 0.97  --   --   ?TROPONINIHS 5  --  6  --   --   --   --   ? < > = values in this interval not displayed.  ? ? ?Estimated Creatinine Clearance: 116.4 mL/min (by C-G formula based on SCr of 0.97 mg/dL). ? ? ?Medical History: ?Past Medical History:  ?Diagnosis Date  ? DVT (deep vein thrombosis) in pregnancy   ? RLE DVT 02/2016  ? Dyspnea   ? Morbid obesity ([place])   ? PE (pulmonary embolism) 02/29/2016  ? Sleep apnea   ? uses a cpap  ? ? ?Medications:  ?Medications Prior to Admission  ?Medication Sig Dispense Refill Last Dose  ? acetaminophen (TYLENOL) 650 MG CR tablet Take 1,300 mg by mouth every 8 (eight) hours as needed for pain or fever.   11/20/2021  ? albuterol (VENTOLIN HFA) 108 (90 Base) MCG/ACT inhaler Inhale 2 puffs into the lungs every 4 (four) hours as needed for wheezing or shortness of breath. 18 g 0 Past Month  ? amLODipine (NORVASC) 5 MG tablet TAKE 1 TABLET (5 MG TOTAL) BY MOUTH DAILY. 90 tablet 0 11/21/2021  ? apixaban (ELIQUIS) 5 MG TABS tablet Take 1 tablet (5 mg total) by mouth 2 (two) times daily. 180 tablet 3 11/21/2021 at 0800  ? atorvastatin (LIPITOR) 20 MG tablet TAKE 1 TABLET BY MOUTH EVERY DAY (Patient taking differently: Take 20 mg by mouth daily.) 90 tablet 0 11/21/2021  ? Blood Pressure Monitor  DEVI Please provide with medically approved xl blood pressure monitoring device. ICD I10.0 1 each 0   ? furosemide (LASIX) 40 MG tablet TAKE 1 TABLET BY MOUTH EVERY DAY (Patient taking differently: Take 40 mg by mouth daily.) 90 tablet 0 11/21/2021  ? Misc. Devices MISC Please provide insurance approved CPAP nasal mask. ICD 10 G47.33 Z99.89 1 each 0   ? PRESCRIPTION MEDICATION 1 each by Other route at bedtime. CPAP- At bedtime     ? ?Scheduled:  ? amLODipine  5 mg Oral Daily  ? apixaban  5 mg Oral BID  ? atorvastatin  20 mg Oral Daily  ? etomidate      ? fentaNYL      ? furosemide  40 mg Intravenous Q12H  ? ketamine HCl      ? midazolam      ? rocuronium bromide      ? sodium chloride flush  3 mL Intravenous Q12H  ? succinylcholine      ? ?Infusions:  ? sodium chloride    ? ? ?Assessment: ?Pt has been on apixaban for her hx of PE. Plan to tx to ICU for possible  intubation. Her last dose was this AM. Transition to IV heparin. The last time that she was on heparin, it was therapeutic at 2000 units/hr. We will monitor by PTT for now due to apixaban.  ? ?Goal of Therapy:  ?Heparin level 0.3-0.7 units/ml ?aPTT 66-102 seconds ?Monitor platelets by anticoagulation protocol: Yes ?  ?Plan:  ? ?Heparin infusion at 2000 units/hr starting at 2100 tonight ?Check PTT/HL in AM then daily ?Daily CBC ? ?Onnie Boer, PharmD, BCIDP, AAHIVP, CPP ?Infectious Disease Pharmacist ?11/23/2021 3:50 PM ? ? ? ?

## 2021-11-23 NOTE — Assessment & Plan Note (Signed)
Does not appear volume overloaded at this time.  Continues to worsen despite effective diuresis. ? ?-Hold on further diuretic for now ?

## 2021-11-23 NOTE — Progress Notes (Signed)
Returned [place] Rider, patient's niece, phone call and provided update on patient. All questions answered.  ?

## 2021-11-23 NOTE — Progress Notes (Signed)
?PROGRESS NOTE ? ?Angel Brewer ZOX:[id number] DOB: 1966/06/15 DOA: 11/21/2021 ?PCP: [name], NP ? ? LOS: 2 days  ? ?Brief Narrative / Interim history: ?56 year old female with history of diastolic CHF, PE on Eliquis, pulmonary hypertension, hypertension, hyperlipidemia, OSA/OHS on trilogy CPAP, morbid obesity with a BMI of 64 who comes to the hospital with shortness of breath.  She reports that this has been going on for the past 3 to 4 days, has noticed worsening lower extremity swelling, unable to lay flat because she gets short of breath and short of breath with exertion.  She also reports some weight gain in the last week but unable to quantify how much.  Denies any chest pain, denies any palpitations.  No abdominal pain, no nausea or vomiting.  In the ED chest x-ray showed pulmonary edema.  She was placed on diuretics and admitted to the hospital ? ?Subjective / 24h Interval events: ?Less responsive overnight, looks like developed mild respiratory acidosis with CO2 retention despite being on BiPAP. ? ?Assesement and Plan: ?Principal Problem: ?  Acute on chronic heart failure with preserved ejection fraction (HFpEF) ([place]) ?Active Problems: ?  Acute respiratory failure with hypoxia ([place]) ?  Obesity hypoventilation syndrome ([place]) ?  History of pulmonary embolus (PE) ?  Essential hypertension ?  Hyperlipidemia ? ?Principal problem ?Acute hypoxic respiratory failure due to acute pulmonary edema due to acute on chronic diastolic CHF -this is likely multifactorial she also has obesity hypoventilation syndrome, OSA.  Remains hypoxic and hypercarbic, continue BiPAP throughout the day now.  Continue Lasix.  She is net -5.7 L and down to 416 pounds from 433 on admission ? ?Active problems ?Acute on chronic respiratory acidosis, hypercarbic respiratory failure-despite being on BiPAP ABG earlier this morning with a pH of 7.24, PCO2 of 104.  Repeat ABG somewhat similar.  She seems to be waking up more and clinically  appears to be improving, discussed with RT, increase minute ventilation on BiPAP, low threshold for ICU transfer.  Continue to evaluate at bedside several times throughout the day ? ?Obesity hypoventilation syndrome-continue nightly BiPAP ? ?History of PE-continue Eliquis ? ?Hyperlipidemia-continue atorvastatin ? ?Essential hypertension-continue amlodipine ? ?Morbid obesity-BMI 64.  There are some evidence of fluid overload but it is clear that she would benefit from significant weight loss moving forward.  I think she should be evaluated in bariatric surgery clinic ? ?Scheduled Meds: ? amLODipine  5 mg Oral Daily  ? apixaban  5 mg Oral BID  ? atorvastatin  20 mg Oral Daily  ? furosemide  40 mg Intravenous Q12H  ? sodium chloride flush  3 mL Intravenous Q12H  ? ?Continuous Infusions: ?PRN Meds:.acetaminophen **OR** acetaminophen, albuterol, ondansetron **OR** ondansetron (ZOFRAN) IV, senna-docusate ? ?Diet Orders (From admission, onward)  ? ?  Start     Ordered  ? 11/21/21 2058  Diet Heart Room service appropriate? Yes; Fluid consistency: Thin  Diet effective now       ?Question Answer Comment  ?Room service appropriate? Yes   ?Fluid consistency: Thin   ?  ? 11/21/21 2057  ? ?  ?  ? ?  ? ? ?DVT prophylaxis: Place TED hose Start: 11/22/21 0833 ?apixaban (ELIQUIS) tablet 5 mg  ? ?Lab Results  ?Component Value Date  ? PLT 183 11/23/2021  ? ? ?  Code Status: Full Code ? ?Family Communication: no family at bedside  ? ?Status is: Inpatient ? ?Remains inpatient appropriate because: Persistent hypoxic respiratory failure ? ? ?Level of care: Progressive ? ?  Consultants:  ?none ? ?Procedures:  ?2D echo ? ?Microbiology  ?none ? ?Antimicrobials: ?none  ? ? ?Objective: ?Vitals:  ? 11/23/21 0618 11/23/21 0753 11/23/21 0949 11/23/21 1200  ?BP:   140/78 140/83  ?Pulse:  65  65  ?Resp:  19  19  ?Temp:      ?TempSrc:      ?SpO2: 100% 99%  95%  ?Weight:      ?Height:      ? ? ?Intake/Output Summary (Last 24 hours) at 11/23/2021  1210 ?Last data filed at 11/23/2021 1001 ?Gross per 24 hour  ?Intake 360 ml  ?Output 2775 ml  ?Net -2415 ml  ? ? ?Wt Readings from Last 3 Encounters:  ?11/23/21 (!) 188.9 kg  ?05/07/21 (!) 181.9 kg  ?12/27/20 (!) 188.2 kg  ? ? ?Examination: ? ?Constitutional: Poorly responsive ?Eyes: lids and conjunctivae normal, no scleral icterus ?ENMT: mmm ?Neck: normal, supple ?Respiratory: Distant breath sounds due to body habitus, no wheezing heard ?Cardiovascular: Regular rate and rhythm, no murmurs / rubs / gallops. No LE edema. ?Abdomen: soft, no distention, no tenderness. Bowel sounds positive.  ?Skin: no rashes ?Neurologic: no focal deficits, equal strength ? ?Data Reviewed: I have independently reviewed following labs and imaging studies  ? ?CBC ?Recent Labs  ?Lab 11/21/21 ?1827 11/21/21 ?1846 11/22/21 ?0418 11/23/21 ?0258 11/23/21 ?5277  ?WBC 5.5  --  6.2 5.1  --   ?HGB 12.6 13.9 12.8 12.9 15.6*  ?HCT 41.9 41.0 45.1 45.3 46.0  ?PLT 210  --  189 183  --   ?MCV 97.7  --  100.4* 100.7*  --   ?MCH 29.4  --  28.5 28.7  --   ?MCHC 30.1  --  28.4* 28.5*  --   ?RDW 16.1*  --  15.7* 14.6  --   ?LYMPHSABS 1.2  --   --   --   --   ?MONOABS 0.6  --   --   --   --   ?EOSABS 0.1  --   --   --   --   ?BASOSABS 0.1  --   --   --   --   ? ? ? ?Recent Labs  ?Lab 11/21/21 ?1827 11/21/21 ?1846 11/22/21 ?0418 11/23/21 ?8242 11/23/21 ?3536  ?NA 140 141 139 139 139  ?K 4.3 4.2 4.4 4.5 4.1  ?CL 105  --  100 94*  --   ?CO2 30  --  29 38*  --   ?GLUCOSE 86  --  80 84  --   ?BUN 12  --  11 10  --   ?CREATININE 0.95  --  1.13* 0.97  --   ?CALCIUM 8.8*  --  8.7* 8.8*  --   ?AST 13*  --   --  12*  --   ?ALT 10  --   --  9  --   ?ALKPHOS 57  --   --  64  --   ?BILITOT 0.9  --   --  1.3*  --   ?ALBUMIN 3.4*  --   --  3.2*  --   ?MG  --   --  2.2 2.1  --   ?BNP 139.2*  --   --   --   --   ? ? ? ?------------------------------------------------------------------------------------------------------------------ ?No results for input(s): CHOL, HDL, LDLCALC,  TRIG, CHOLHDL, LDLDIRECT in the last 72 hours. ? ?Lab Results  ?Component Value Date  ? HGBA1C 5.4 06/10/2016  ? ?------------------------------------------------------------------------------------------------------------------ ?No results for  input(s): TSH, T4TOTAL, T3FREE, THYROIDAB in the last 72 hours. ? ?Invalid input(s): FREET3 ? ?Cardiac Enzymes ?No results for input(s): CKMB, TROPONINI, MYOGLOBIN in the last 168 hours. ? ?Invalid input(s): CK ?------------------------------------------------------------------------------------------------------------------ ?   ?Component Value Date/Time  ? BNP 139.2 (H) 11/21/2021 1827  ? ? ?CBG: ?Recent Labs  ?Lab 11/23/21 ?2712  ?GLUCAP 89  ? ? ?Recent Results (from the past 240 hour(s))  ?Resp Panel by RT-PCR (Flu A&B, Covid) Nasopharyngeal Swab     Status: None  ? Collection Time: 11/21/21  6:14 PM  ? Specimen: Nasopharyngeal Swab; Nasopharyngeal(NP) swabs in vial transport medium  ?Result Value Ref Range Status  ? SARS Coronavirus 2 by RT PCR NEGATIVE NEGATIVE Final  ?  Comment: (NOTE) ?SARS-CoV-2 target nucleic acids are NOT DETECTED. ? ?The SARS-CoV-2 RNA is generally detectable in upper respiratory ?specimens during the acute phase of infection. The lowest ?concentration of SARS-CoV-2 viral copies this assay can detect is ?138 copies/mL. A negative result does not preclude SARS-Cov-2 ?infection and should not be used as the sole basis for treatment or ?other patient management decisions. A negative result may occur with  ?improper specimen collection/handling, submission of specimen other ?than nasopharyngeal swab, presence of viral mutation(s) within the ?areas targeted by this assay, and inadequate number of viral ?copies(<138 copies/mL). A negative result must be combined with ?clinical observations, patient history, and epidemiological ?information. The expected result is Negative. ? ?Fact Sheet for Patients:  ?EntrepreneurPulse.com.au ? ?Fact  Sheet for Healthcare Providers:  ?IncredibleEmployment.be ? ?This test is no t yet approved or cleared by the [place] FDA and  ?has been authorized for detection and/or diagnosis of S

## 2021-11-23 NOTE — Assessment & Plan Note (Signed)
Due to hypercarbia.  May be unable to effectively protect her airway. ? ?-We will intubate if mental status does not improve following transfer. ?

## 2021-11-23 NOTE — Progress Notes (Signed)
ABG resulted on ISTAT due to delay at tube station and lab ABG machine malfunction. MD aware, MD made aware of results, RT will continue to monitor.  ? ?  11/23/21 08:52  ?Sample type  ARTERIAL  ?pH, Arterial  7.237 (L)  ?pCO2 arterial  109.6 (HH)  ?pO2, Arterial  86  ?TCO2  49 (H)  ?Acid-Base Excess  13.0 (H)  ?Bicarbonate  46.3 (H)  ?O2 Saturation  93  ?Patient temperature  99.8 F  ?Collection site  RADIAL, ALLEN'S TEST ACCEPTABLE  ? ?

## 2021-11-23 NOTE — Progress Notes (Signed)
Called patient's niece to give her another update and informed her that patient will be transferred to [facility]. She stated understanding. ?

## 2021-11-23 NOTE — Procedures (Signed)
Intubation Procedure Note ? ?Angel Brewer  ?[id number]  ?[birth year] ? ?Date:11/23/21  ?Time:4:37 PM  ? ?Provider Performing:Yaa Donnellan  ? ? ?Procedure: Intubation (64847) ? ?Indication(s) ?Respiratory Failure ? ?Consent ?Risks of the procedure as well as the alternatives and risks of each were explained to the patient and/or caregiver.  Consent for the procedure was obtained and is signed in the bedside chart ? ? ?Anesthesia ?Versed, Fentanyl, Succinylcholine, and Ketamine ? ? ?Time Out ?Verified patient identification, verified procedure, site/side was marked, verified correct patient position, special equipment/implants available, medications/allergies/relevant history reviewed, required imaging and test results available. ? ? ?Sterile Technique ?Usual hand hygeine, masks, and gloves were used ? ? ?Procedure Description ?Patient positioned in bed supine.  Sedation given as noted above.  Patient was intubated with endotracheal tube using Glidescope.  View was Grade 1 full glottis .  Number of attempts was 1.  Colorimetric CO2 detector was consistent with tracheal placement. Intubated in beachchair position with ramp. Required cricoid pressure for optimal visualization. 7.25m ETT inserted to 24cm.  ? ? ?Complications/Tolerance ?None; patient tolerated the procedure well. ?Chest X-ray is ordered to verify placement. ? ?Angel Brood MD FRCPC ?ICU Physician ?[place] ?Pager: [phone]?Or Epic Secure Chat ?After hours: [phone]. ? ?11/23/2021, 4:39 PM ? ? ? ? ? ?

## 2021-11-23 NOTE — Progress Notes (Signed)
Called patient's son, Antony Blackbird, per his request for update on patient. Update provided. All questions answered. No further questions or concerns at the present time. ?

## 2021-11-23 NOTE — Progress Notes (Signed)
? ?NAMEAreana Brewer, MRN:  [id number], DOB:  [birth year], LOS: 2 ?ADMISSION DATE:  11/21/2021, CONSULTATION DATE: 11/23/2021 ?REFERRING MD:  Benson Norway, CHIEF COMPLAINT: Obtundation ? ?History of Present Illness:  ?56 year old woman who was admitted with increasing shortness of breath.  Now in hypercarbic respiratory failure with increasing obtundation. ? ?Unable to provide history at this time.  Spoke with niece who reports increasing shortness of breath over the last few days.  No other associated symptoms.  Prior admissions for similar but has never been intubated. ? ?Initially no hypercarbia.  Now PCO2 in the 70s.  Was initially responding to questions this morning but is no longer doing so. ?She has not been using CPAP for several years now has her machine has broken down and she is awaiting a repeat sleep study. ? ?Pertinent  Medical History  ? ?Past Medical History:  ?Diagnosis Date  ? DVT (deep vein thrombosis) in pregnancy   ? RLE DVT 02/2016  ? Dyspnea   ? Morbid obesity ([place])   ? PE (pulmonary embolism) 02/29/2016  ? Sleep apnea   ? uses a cpap  ? ? ?Significant Hospital Events: ?Including procedures, antibiotic start and stop dates in addition to other pertinent events   ?5/11 initial admission to hospitalist service.  Started on BiPAP and diuresed successfully for 5 L. ?5/13 increasing somnolence and hypercarbia new since admission. ? ?Interim History / Subjective:  ?She is unable to provide much history at this time.  Barely responds to pain. ? ?Objective   ?Blood pressure 140/83, pulse 65, temperature 99.3 ?F (37.4 ?C), temperature source Oral, resp. rate 19, height _0  (1.727 m), weight (!) 188.9 kg, last menstrual period 11/29/2015, SpO2 95 %. ?   ?FiO2 (%):  [50 %-60 %] 50 %  ? ?Intake/Output Summary (Last 24 hours) at 11/23/2021 1455 ?Last data filed at 11/23/2021 1001 ?Gross per 24 hour  ?Intake 360 ml  ?Output 2775 ml  ?Net -2415 ml  ? ?Filed Weights  ? 11/22/21 0052 11/22/21 0600 11/23/21 0543   ?Weight: (!) 196.2 kg (!) 193.5 kg (!) 188.9 kg  ? ? ?Examination: ?General: Morbidly obese woman lying in bed ?HENT: Moist mucous membranes.  BiPAP mask in place without skin breakdown.  Minimal leak. ?Lungs: Decreased breath sounds bilaterally particularly on the left side.  On BiPAP 18/5, at rest generates VT of only 250 mL and minute ventilation 5 to 6 L.  Increased air leak as IPAP increased further. ?Cardiovascular: Distant heart sounds.  Extremities warm. ?Abdomen: Protuberant abdomen.  Soft. ?Extremities: No clear signs of pitting edema ?Neuro: Responds to pain only. ?GU: Clear urine ? ?Ancillary tests personally reviewed  ?Chest x-ray shows decreased lung volumes with increased interstitial markings consistent with either hypoventilation or pulmonary edema. ?BNP only marginally elevated 139. ?Increasing respiratory acidosis with PCO2 in the 100s. ? ?Assessment & Plan:  ? ?Acute metabolic encephalopathy ?Due to hypercarbia.  May be unable to effectively protect her airway. ? ?-We will intubate if mental status does not improve following transfer. ? ?History of pulmonary embolus (PE) ?Currently on apixaban. ? ?-Bilateral venous Dopplers to ensure no evidence of acute clot. ? ?Acute on chronic heart failure with preserved ejection fraction (HFpEF) ([place]) ?Does not appear volume overloaded at this time.  Continues to worsen despite effective diuresis. ? ?-Hold on further diuretic for now ? ?Obesity hypoventilation syndrome ([place]) ?Known history of OSA with last sleep study 5 years ago.  Currently not using CPAP because machine broke down. ? ?-  Will need repeat sleep study and CPAP at discharge. ? ?Acute respiratory failure with hypoxia and hypercapnia (HCC) ?Increasing somnolence with acute respiratory acidosis.  On BiPAP with reasonable seal but continues to hyperventilate achieving a tidal volume of only 250 mL a minute ventilation of around 5-6 L/min ? ?-To ICU.  Attempt to readjust mask. ?-Place art line and  check ABG ?-If remains hypercarbic will need to intubate.  Have discussed with listed point of contact, her niece, who agrees. ? ?Hyperlipidemia ?At risk for prolonged mechanical ventilation. ? ?-May benefit from tracheostomy long-term. ? ? ?Best Practice (right click and "Reselect all SmartList Selections" daily)  ? ?Diet/type: NPO ?DVT prophylaxis: systemic heparin ?GI prophylaxis: N/A ?Lines: N/A ?Foley:  N/A ?Code Status:  full code ?Last date of multidisciplinary goals of care discussion [niece updated today 5/13] ? ?CRITICAL CARE ?Performed by: Kipp Brood ? ? ?Total critical care time: 40 minutes ? ?Critical care time was exclusive of separately billable procedures and treating other patients. ? ?Critical care was necessary to treat or prevent imminent or life-threatening deterioration. ? ?Critical care was time spent personally by me on the following activities: development of treatment plan with patient and/or surrogate as well as nursing, discussions with consultants, evaluation of patient's response to treatment, examination of patient, obtaining history from patient or surrogate, ordering and performing treatments and interventions, ordering and review of laboratory studies, ordering and review of radiographic studies, pulse oximetry, re-evaluation of patient's condition and participation in multidisciplinary rounds. ? ?Kipp Brood, MD FRCPC ?ICU Physician ?[place]  ?Pager: [phone] ?Mobile: [phone] ?After hours: [phone]. ? ? ? ? ?

## 2021-11-23 NOTE — Progress Notes (Addendum)
Pt transported on BiPAP from 3E20 to 2H18 without any complications.RN at bedside, RT will continue to  monitor.  ?

## 2021-11-23 NOTE — Assessment & Plan Note (Signed)
Known history of OSA with last sleep study 5 years ago.  Currently not using CPAP because machine broke down. ? ?-Will need repeat sleep study and CPAP at discharge. ?

## 2021-11-24 ENCOUNTER — Inpatient Hospital Stay (HOSPITAL_COMMUNITY): Payer: Medicaid Other

## 2021-11-24 ENCOUNTER — Inpatient Hospital Stay: Payer: Self-pay

## 2021-11-24 DIAGNOSIS — Z86711 Personal history of pulmonary embolism: Secondary | ICD-10-CM

## 2021-11-24 DIAGNOSIS — Z4682 Encounter for fitting and adjustment of non-vascular catheter: Secondary | ICD-10-CM | POA: Diagnosis not present

## 2021-11-24 DIAGNOSIS — R0602 Shortness of breath: Secondary | ICD-10-CM | POA: Diagnosis not present

## 2021-11-24 DIAGNOSIS — I5033 Acute on chronic diastolic (congestive) heart failure: Secondary | ICD-10-CM | POA: Diagnosis not present

## 2021-11-24 LAB — POCT I-STAT EG7
Acid-Base Excess: 16 mmol/L — ABNORMAL HIGH (ref 0.0–2.0)
Acid-Base Excess: 16 mmol/L — ABNORMAL HIGH (ref 0.0–2.0)
Bicarbonate: 44 mmol/L — ABNORMAL HIGH (ref 20.0–28.0)
Bicarbonate: 42.7 mmol/L — ABNORMAL HIGH (ref 20.0–28.0)
Calcium, Ion: 1.17 mmol/L (ref 1.15–1.40)
Calcium, Ion: 1.08 mmol/L — ABNORMAL LOW (ref 1.15–1.40)
HCT: 42 % (ref 36.0–46.0)
HCT: 40 % (ref 36.0–46.0)
Hemoglobin: 14.3 g/dL (ref 12.0–15.0)
Hemoglobin: 13.6 g/dL (ref 12.0–15.0)
O2 Saturation: 71 %
O2 Saturation: 70 %
Patient temperature: 98.8
Patient temperature: 99.5
Potassium: 3.3 mmol/L — ABNORMAL LOW (ref 3.5–5.1)
Potassium: 3.2 mmol/L — ABNORMAL LOW (ref 3.5–5.1)
Sodium: 138 mmol/L (ref 135–145)
Sodium: 134 mmol/L — ABNORMAL LOW (ref 135–145)
TCO2: 46 mmol/L — ABNORMAL HIGH (ref 22–32)
TCO2: 45 mmol/L — ABNORMAL HIGH (ref 22–32)
pCO2, Ven: 65.9 mmHg — ABNORMAL HIGH (ref 44–60)
pCO2, Ven: 63.5 mmHg — ABNORMAL HIGH (ref 44–60)
pH, Ven: 7.433 — ABNORMAL HIGH (ref 7.25–7.43)
pH, Ven: 7.438 — ABNORMAL HIGH (ref 7.25–7.43)
pO2, Ven: 38 mmHg (ref 32–45)
pO2, Ven: 38 mmHg (ref 32–45)

## 2021-11-24 LAB — POCT I-STAT 7, (LYTES, BLD GAS, ICA,H+H)
Acid-Base Excess: 20 mmol/L — ABNORMAL HIGH (ref 0.0–2.0)
Bicarbonate: 39.1 mmol/L — ABNORMAL HIGH (ref 20.0–28.0)
Calcium, Ion: 1.08 mmol/L — ABNORMAL LOW (ref 1.15–1.40)
HCT: 40 % (ref 36.0–46.0)
Hemoglobin: 13.6 g/dL (ref 12.0–15.0)
O2 Saturation: 100 %
Patient temperature: 97.6
Potassium: 3.1 mmol/L — ABNORMAL LOW (ref 3.5–5.1)
Sodium: 131 mmol/L — ABNORMAL LOW (ref 135–145)
TCO2: 40 mmol/L — ABNORMAL HIGH (ref 22–32)
pCO2 arterial: 25.1 mmHg — ABNORMAL LOW (ref 32–48)
pH, Arterial: 7.799 (ref 7.35–7.45)
pO2, Arterial: 140 mmHg — ABNORMAL HIGH (ref 83–108)

## 2021-11-24 LAB — BASIC METABOLIC PANEL
Anion gap: 9 (ref 5–15)
BUN: 18 mg/dL (ref 6–20)
CO2: 35 mmol/L — ABNORMAL HIGH (ref 22–32)
Calcium: 8.9 mg/dL (ref 8.9–10.3)
Chloride: 91 mmol/L — ABNORMAL LOW (ref 98–111)
Creatinine, Ser: 1.02 mg/dL — ABNORMAL HIGH (ref 0.44–1.00)
GFR, Estimated: >60 mL/min (ref >60)
Glucose, Bld: 86 mg/dL (ref 70–99)
Potassium: 3.3 mmol/L — ABNORMAL LOW (ref 3.5–5.1)
Sodium: 135 mmol/L (ref 135–145)

## 2021-11-24 LAB — RENAL FUNCTION PANEL
Albumin: 3.1 g/dL — ABNORMAL LOW (ref 3.5–5.0)
Anion gap: 10 (ref 5–15)
BUN: 17 mg/dL (ref 6–20)
CO2: 36 mmol/L — ABNORMAL HIGH (ref 22–32)
Calcium: 9.1 mg/dL (ref 8.9–10.3)
Chloride: 90 mmol/L — ABNORMAL LOW (ref 98–111)
Creatinine, Ser: 1.1 mg/dL — ABNORMAL HIGH (ref 0.44–1.00)
GFR, Estimated: 59 mL/min — ABNORMAL LOW (ref >60)
Glucose, Bld: 95 mg/dL (ref 70–99)
Phosphorus: 2.3 mg/dL — ABNORMAL LOW (ref 2.5–4.6)
Potassium: 3.6 mmol/L (ref 3.5–5.1)
Sodium: 136 mmol/L (ref 135–145)

## 2021-11-24 LAB — CBC
HCT: 40.7 % (ref 36.0–46.0)
Hemoglobin: 13.3 g/dL (ref 12.0–15.0)
MCH: 29.8 pg (ref 26.0–34.0)
MCHC: 32.7 g/dL (ref 30.0–36.0)
MCV: 91.1 fL (ref 80.0–100.0)
Platelets: 203 10*3/uL (ref 150–400)
RBC: 4.47 MIL/uL (ref 3.87–5.11)
RDW: 14.6 % (ref 11.5–15.5)
WBC: 7 10*3/uL (ref 4.0–10.5)
nRBC: 0 % (ref 0.0–0.2)

## 2021-11-24 LAB — GLUCOSE, CAPILLARY
Glucose-Capillary: 134 mg/dL — ABNORMAL HIGH (ref 70–99)
Glucose-Capillary: 81 mg/dL (ref 70–99)
Glucose-Capillary: 99 mg/dL (ref 70–99)

## 2021-11-24 LAB — APTT
aPTT: 98 seconds — ABNORMAL HIGH (ref 24–36)
aPTT: 103 seconds — ABNORMAL HIGH (ref 24–36)

## 2021-11-24 LAB — TRIGLYCERIDES: Triglycerides: 103 mg/dL (ref <150)

## 2021-11-24 LAB — HEPARIN LEVEL (UNFRACTIONATED): Heparin Unfractionated: >1.1 IU/mL — ABNORMAL HIGH (ref 0.30–0.70)

## 2021-11-24 LAB — PHOSPHORUS: Phosphorus: 3.4 mg/dL (ref 2.5–4.6)

## 2021-11-24 LAB — MAGNESIUM: Magnesium: 1.8 mg/dL (ref 1.7–2.4)

## 2021-11-24 MED ORDER — ORAL CARE MOUTH RINSE
15.0000 mL | OROMUCOSAL | Status: DC
  Administered 2021-11-24 – 2021-12-06 (×119): 15 mL via OROMUCOSAL

## 2021-11-24 MED ORDER — CHLORHEXIDINE GLUCONATE CLOTH 2 % EX PADS
6.0000 | MEDICATED_PAD | Freq: Every day | CUTANEOUS | Status: DC
  Administered 2021-11-24 – 2021-12-26 (×33): 6 via TOPICAL

## 2021-11-24 MED ORDER — ORAL CARE MOUTH RINSE: 15.0000 mL | OROMUCOSAL | Status: DC

## 2021-11-24 MED ORDER — ATORVASTATIN CALCIUM 10 MG PO TABS
20.0000 mg | ORAL_TABLET | Freq: Every day | ORAL | Status: DC
Start: 2021-11-24 — End: 2021-12-16
  Administered 2021-11-24 – 2021-12-16 (×23): 20 mg via NASOGASTRIC
  Filled 2021-11-24 (×22): qty 2

## 2021-11-24 MED ORDER — NOREPINEPHRINE 4 MG/250ML-% IV SOLN
2.0000 ug/min | INTRAVENOUS | Status: DC
  Administered 2021-11-29: 1 ug/min via INTRAVENOUS
  Filled 2021-11-24: qty 250

## 2021-11-24 MED ORDER — SODIUM CHLORIDE 0.9 % IV SOLN
250.0000 mL | INTRAVENOUS | Status: DC
Start: 2021-11-24 — End: 2022-01-04
  Administered 2021-11-25 – 2021-12-04 (×2): 250 mL via INTRAVENOUS

## 2021-11-24 MED ORDER — FUROSEMIDE 10 MG/ML IJ SOLN
60.0000 mg | Freq: Two times a day (BID) | INTRAMUSCULAR | Status: DC
  Administered 2021-11-24 – 2021-11-25 (×2): 60 mg via INTRAVENOUS
  Filled 2021-11-24 (×2): qty 6

## 2021-11-24 MED ORDER — SODIUM CHLORIDE 0.9% FLUSH
10.0000 mL | Freq: Two times a day (BID) | INTRAVENOUS | Status: DC
  Administered 2021-11-24: 10 mL
  Administered 2021-11-24: 20 mL
  Administered 2021-11-25 – 2021-11-28 (×5): 10 mL
  Administered 2021-11-29: 40 mL
  Administered 2021-11-29 – 2021-12-05 (×12): 10 mL
  Administered 2021-12-06: 20 mL
  Administered 2021-12-06 – 2021-12-08 (×3): 10 mL
  Administered 2021-12-08: 30 mL
  Administered 2021-12-09 – 2021-12-18 (×14): 10 mL

## 2021-11-24 MED ORDER — SENNOSIDES-DOCUSATE SODIUM 8.6-50 MG PO TABS: 1.0000 | ORAL_TABLET | Freq: Every evening | ORAL | Status: DC | PRN

## 2021-11-24 MED ORDER — CHLORHEXIDINE GLUCONATE 0.12 % MT SOLN
15.0000 mL | Freq: Two times a day (BID) | OROMUCOSAL | Status: DC
  Administered 2021-11-24 (×3): 15 mL via OROMUCOSAL
  Filled 2021-11-24 (×3): qty 15

## 2021-11-24 MED ORDER — VITAL HIGH PROTEIN PO LIQD
1000.0000 mL | ORAL | Status: DC
  Administered 2021-11-24 (×3): 1000 mL

## 2021-11-24 MED ORDER — CHLORHEXIDINE GLUCONATE 0.12% ORAL RINSE (MEDLINE KIT): 15.0000 mL | Freq: Two times a day (BID) | OROMUCOSAL | Status: DC

## 2021-11-24 MED ORDER — CHLORHEXIDINE GLUCONATE 0.12% ORAL RINSE (MEDLINE KIT)
15.0000 mL | Freq: Two times a day (BID) | OROMUCOSAL | Status: DC
  Administered 2021-11-25 – 2021-12-06 (×23): 15 mL via OROMUCOSAL

## 2021-11-24 MED ORDER — PROSOURCE TF PO LIQD
45.0000 mL | Freq: Two times a day (BID) | ORAL | Status: DC
  Administered 2021-11-24 – 2021-12-01 (×14): 45 mL
  Filled 2021-11-24 (×15): qty 45

## 2021-11-24 MED ORDER — SODIUM CHLORIDE 0.9% FLUSH
10.0000 mL | INTRAVENOUS | Status: DC | PRN
  Administered 2021-11-25: 10 mL

## 2021-11-24 NOTE — Progress Notes (Signed)
? ?NAMEShonna Brewer, MRN:  [id number], DOB:  Mar 28, 1966, LOS: 3 ?ADMISSION DATE:  11/21/2021, CONSULTATION DATE: 11/23/2021 ?REFERRING MD:  Benson Norway, CHIEF COMPLAINT: Obtundation ? ?History of Present Illness:  ?56 year old woman who was admitted with increasing shortness of breath.  Now in hypercarbic respiratory failure with increasing obtundation. ? ?Unable to provide history at this time.  Spoke with niece who reports increasing shortness of breath over the last few days.  No other associated symptoms.  Prior admissions for similar but has never been intubated. ? ?Initially no hypercarbia.  Now PCO2 in the 70s.  Was initially responding to questions this morning but is no longer doing so. ?She has not been using CPAP for several years now has her machine has broken down and she is awaiting a repeat sleep study. ? ?Pertinent  Medical History  ? ?Past Medical History:  ?Diagnosis Date  ? DVT (deep vein thrombosis) in pregnancy   ? RLE DVT 02/2016  ? Dyspnea   ? Morbid obesity ([place])   ? PE (pulmonary embolism) 02/29/2016  ? Sleep apnea   ? uses a cpap  ? ? ?Significant Hospital Events: ?Including procedures, antibiotic start and stop dates in addition to other pertinent events   ?5/11 initial admission to hospitalist service.  Started on BiPAP and diuresed successfully for 5 L. ?5/13 increasing somnolence and hypercarbia new since admission. ? ?Interim History / Subjective:  ?Failed trial of NIV yesterday for her hypercapnic respiratory failure, intubated. Discussed trach at bedside with pt and her son.  ? ?Objective   ?Blood pressure (!) 88/52, pulse (!) 47, temperature 98 ?F (36.7 ?C), temperature source Oral, resp. rate 20, height _0  (1.727 m), weight (!) 190 kg, last menstrual period 11/29/2015, SpO2 97 %. ?   ?Vent Mode: PRVC ?FiO2 (%):  [40 %-100 %] 40 % ?Set Rate:  [12 bmp-20 bmp] 12 bmp ?Vt Set:  [380 mL-510 mL] 380 mL ?PEEP:  [10 cmH20-12 cmH20] 10 cmH20 ?Plateau Pressure:  [25 cmH20-33 cmH20] 25  cmH20  ? ?Intake/Output Summary (Last 24 hours) at 11/24/2021 0908 ?Last data filed at 11/24/2021 0744 ?Gross per 24 hour  ?Intake 1003.19 ml  ?Output 3450 ml  ?Net -2446.81 ml  ? ?Filed Weights  ? 11/22/21 0600 11/23/21 0543 11/24/21 0500  ?Weight: (!) 193.5 kg (!) 188.9 kg (!) 190 kg  ? ? ?Examination: ?General appearance: 56 y.o., female, intubated, sedated ?Eyes: PERRL, tracking appropriately ?HENT: NCAT; MMM ?Neck: Trachea midline; no lymphadenopathy, no JVD ?Lungs: Distant, no crackles, no wheeze, with normal respiratory effort ?CV: RRR, no murmur  ?Abdomen: Soft, non-tender; non-distended, BS present  ?Extremities: No peripheral edema, warm ?Skin: Normal turgor and texture; no rash ?Neuro: follows commands ? ?Labs reviewed: ? ?S Cr stable ?pH way too high last night, rate and Vt adjusted downward and pt triggering, alert although we've had trouble getting a repeat blood gas ? ?CXR likely underpenetrated in setting of her habitus but may have dependent predominant increased interstitial/alveolar opacities, ETT ok ? ? ?Assessment & Plan:  ? ?Acute respiratory failure with hypoxia and hypercapnia (HCC) ?On floor her EPAP was drastically below optimal EPAP settings (on PSG titration when she weighed less, her target EPAP was 15!). While ventilation improved with higher EPAP in ICU, still didn't look great, intubated 5/13. ?-LTVV, VAP bundle, PAD protocol ?-attempt to transition to prn fentanyl and propofol for target RASS -1 ?-place picc today and check vbg later ?-Tentative plan after talking about it this morning with pt  and son is for trach ? ?Acute metabolic encephalopathy ?Due to hypercarbia.  ?- Vent mgmt as below ? ?History of pulmonary embolus (PE) ?- heparin gtt for now with plan for trach in next day or 2 ? ?Acute on chronic heart failure with preserved ejection fraction (HFpEF) ([place]) ?-hold home amlodipine ?-Diurese for net negative fluid balance ? ?Obesity hypoventilation syndrome ([place]) ?Known history  of OSA with last sleep study 5 years ago.  Currently not using PAP because machine broke down. ?-Certainly has severe OSA and would need consideration of trach - but strong chance she would need ventilator at night at least for OHS. Discussed this with pt and son this morning. ? ?Hyperlipidemia ?-home atorvastatin ? ? ?Best Practice (right click and "Reselect all SmartList Selections" daily)  ? ?Diet/type: NPO ?DVT prophylaxis: systemic heparin ?GI prophylaxis: N/A ?Lines: N/A - place PICC today ?Foley:  N/A ?Code Status:  full code ?Last date of multidisciplinary goals of care discussion [Updated patient and son this morning.] ? ?CRITICAL CARE ?Performed by: Maryjane Hurter ? ? ?Total critical care time: 35 minutes ? ?Critical care time was exclusive of separately billable procedures and treating other patients. ? ?Critical care was necessary to treat or prevent imminent or life-threatening deterioration. ? ?Critical care was time spent personally by me on the following activities: development of treatment plan with patient and/or surrogate as well as nursing, discussions with consultants, evaluation of patient's response to treatment, examination of patient, obtaining history from patient or surrogate, ordering and performing treatments and interventions, ordering and review of laboratory studies, ordering and review of radiographic studies, pulse oximetry, re-evaluation of patient's condition and participation in multidisciplinary rounds. ? ?Walker Shadow  ?[place] ? ? ? ? ? ?

## 2021-11-24 NOTE — Progress Notes (Addendum)
eLink Physician-Brief Progress Note ?Patient Name: Angel Brewer ?DOB: [birth year] ?MRN: [id number] ? ? ?Date of Service ? 11/24/2021  ?HPI/Events of Note ? Pt remains intubated.  RN asking for restraints renewal.   ?eICU Interventions ? Restraints renewed.  ? ? ? ?Intervention Category ?Minor Interventions: Other: ? ?Elsie Lincoln ?11/24/2021, 11:08 PM ? ?2:58 AM ?AM ABG ordered.  ?

## 2021-11-24 NOTE — Progress Notes (Signed)
BLE venous duplex has been completed ? ? ?Results can be found under chart review under CV PROC. ?11/24/2021 5:35 PM ?Mikeila Burgen RVT, RDMS ? ?

## 2021-11-24 NOTE — Progress Notes (Signed)
Brief Nutrition Note ? ?Consult received for enteral/tube feeding initiation and management. ? ?Adult Enteral Nutrition Protocol initiated.  ?Full assessment to follow. ? ?OG tube in place with tip located at the GE junction per xray imaging 5/14; radiology recommends that the tube is advanced by 5 cm. Reached out to RN to advance tube and get repeat abdominal x-ray to confirm placement prior to starting tube feeds.   ? ?Admitting Dx: Morbid obesity ([place]) [E66.01] ?Pulmonary hypertension ([place]) [I27.20] ?Acute respiratory failure with hypoxia ([place]) [J96.01] ?Acute on chronic congestive heart failure, unspecified heart failure type ([place]) [I50.9] ?Acute on chronic heart failure with preserved ejection fraction (HFpEF) ([place]) [I50.33] ? ? ?Labs: ? ?Recent Labs  ?Lab 11/22/21 ?0418 11/23/21 ?7680 11/23/21 ?8811 11/24/21 ?0601 11/24/21 ?0315 11/24/21 ?1301  ?NA 139 139   < > 135 136 138  ?K 4.4 4.5   < > 3.3* 3.6 3.3*  ?CL 100 94*  --  91* 90*  --   ?CO2 29 38*  --  35* 36*  --   ?BUN 11 10  --  18 17  --   ?CREATININE 1.13* 0.97  --  1.02* 1.10*  --   ?CALCIUM 8.7* 8.8*  --  8.9 9.1  --   ?MG 2.2 2.1  --   --   --   --   ?PHOS  --   --   --   --  2.3*  --   ?GLUCOSE 80 84  --  86 95  --   ? < > = values in this interval not displayed.  ? ? ?Hermina Barters RD, LDN ?Clinical Dietitian ?See AMiON for contact information.  ? ? ?

## 2021-11-24 NOTE — Progress Notes (Signed)
ANTICOAGULATION CONSULT NOTE - Initial Consult ? ?Pharmacy Consult for heparin ?Indication: pulmonary embolus ? ?No Known Allergies ? ?Patient Measurements: ?Height: _0  (172.7 cm) ?Weight: (!) 190 kg (418 lb 14 oz) ?IBW/kg (Calculated) : 63.9 ?Heparin Dosing Weight: 114kg ? ?Vital Signs: ?Temp: 98.8 ?F (37.1 ?C) (05/14 1111) ?Temp Source: Oral (05/14 1111) ?BP: 104/58 (05/14 1400) ?Pulse Rate: 48 (05/14 1400) ? ?Labs: ?Recent Labs  ?  11/21/21 ?1827 11/21/21 ?1846 11/21/21 ?3474 11/22/21 ?0418 11/23/21 ?2595 11/23/21 ?6387 11/24/21 ?5643 11/24/21 ?0601 11/24/21 ?3295 11/24/21 ?1301 11/24/21 ?1307  ?HGB 12.6   < >  --  12.8 12.9   < > 13.6  --  13.3 14.3  --   ?HCT 41.9   < >  --  45.1 45.3   < > 40.0  --  40.7 42.0  --   ?PLT 210  --   --  189 183  --   --   --  203  --   --   ?APTT  --   --   --   --   --   --   --  98*  --   --  103*  ?HEPARINUNFRC  --   --   --   --   --   --   --  >1.10*  --   --   --   ?CREATININE 0.95  --   --  1.13* 0.97  --   --  1.02* 1.10*  --   --   ?TROPONINIHS 5  --  6  --   --   --   --   --   --   --   --   ? < > = values in this interval not displayed.  ? ? ? ?Estimated Creatinine Clearance: 103 mL/min (A) (by C-G formula based on SCr of 1.1 mg/dL (H)). ? ? ?Medical History: ?Past Medical History:  ?Diagnosis Date  ? DVT (deep vein thrombosis) in pregnancy   ? RLE DVT 02/2016  ? Dyspnea   ? Morbid obesity ([place])   ? PE (pulmonary embolism) 02/29/2016  ? Sleep apnea   ? uses a cpap  ? ?Medications:  ?Infusions:  ? sodium chloride    ? fentaNYL infusion INTRAVENOUS 50 mcg/hr (11/24/21 1100)  ? heparin 2,000 Units/hr (11/24/21 1100)  ? norepinephrine (LEVOPHED) Adult infusion    ? propofol (DIPRIVAN) infusion 20 mcg/kg/min (11/24/21 1100)  ? ? ?Assessment: ?56 yo female on PTA Eliquis for her hx of PE. Transferred to ICU for intubation. Her last dose was 5/13 in AM. Pharmacy to dose heparin. The last time that she was on heparin, it was therapeutic at 2000 units/hr. Using aPTT for  monitoring while Eliquis impacting anti-Xa. ? ?aPTT 103 sec slightly above goal. CBC stable. No bleeding noted. ? ?Goal of Therapy:  ?Heparin level 0.3-0.7 units/ml ?aPTT 66-102 seconds ?Monitor platelets by anticoagulation protocol: Yes ?  ?Plan:  ?Reduce heparin infusion slightly to 1950 units/hr  ?Daily aPTT/HL, CBC ? ?Laurey Arrow, PharmD ?PGY1 Pharmacy Resident ?11/24/2021  2:16 PM ? ?Please check [email or web address] for unit-specific pharmacy phone numbers. ? ?

## 2021-11-24 NOTE — Progress Notes (Signed)
Peripherally Inserted Central Catheter Placement ? ?The IV Nurse has discussed with the patient and/or persons authorized to consent for the patient, the purpose of this procedure and the potential benefits and risks involved with this procedure.  The benefits include less needle sticks, lab draws from the catheter, and the patient may be discharged home with the catheter. Risks include, but not limited to, infection, bleeding, blood clot (thrombus formation), and puncture of an artery; nerve damage and irregular heartbeat and possibility to perform a PICC exchange if needed/ordered by physician.  Alternatives to this procedure were also discussed.  Bard Power PICC patient education guide, fact sheet on infection prevention and patient information card has been provided to patient /or left at bedside.  ?Consent received via son at bedside and niece/caregiver via telephone consent . ? ?PICC Placement Documentation  ?PICC Double Lumen 41/14/64 Left Cephalic 38 cm 0 cm (Active)  ?Indication for Insertion or Continuance of Line Vasoactive infusions;Prolonged intravenous therapies 11/24/21 1100  ?Exposed Catheter (cm) 0 cm 11/24/21 1100  ?Site Assessment Clean, Dry, Intact 11/24/21 1100  ?Lumen #1 Status Flushed;Saline locked;Blood return noted 11/24/21 1100  ?Lumen #2 Status Flushed;Saline locked;Blood return noted 11/24/21 1100  ?Dressing Type Transparent;Securing device 11/24/21 1100  ?Dressing Status Antimicrobial disc in place 11/24/21 1100  ?Safety Lock Not Applicable 31/42/76 7011  ?Line Care Connections checked and tightened 11/24/21 1100  ?Line Adjustment (NICU/IV Team Only) No 11/24/21 1100  ?Dressing Intervention New dressing 11/24/21 1100  ?Dressing Change Due 12/01/21 11/24/21 1100  ? ? ? ? ? ?Mickel Baas  Parminder Trapani ?11/24/2021, 11:59 AM ? ?

## 2021-11-24 NOTE — Progress Notes (Signed)
ANTICOAGULATION CONSULT NOTE - Follow Up Consult ? ?Pharmacy Consult for heparin ?Indication: pulmonary embolus ? ?Labs: ?Recent Labs  ?  11/21/21 ?1827 11/21/21 ?1846 11/21/21 ?9806 11/22/21 ?0418 11/23/21 ?9996 11/23/21 ?7227 11/23/21 ?1518 11/24/21 ?7375 11/24/21 ?0601  ?HGB 12.6   < >  --  12.8 12.9 15.6* 14.6 13.6  --   ?HCT 41.9   < >  --  45.1 45.3 46.0 43.0 40.0  --   ?PLT 210  --   --  189 183  --   --   --   --   ?APTT  --   --   --   --   --   --   --   --  98*  ?HEPARINUNFRC  --   --   --   --   --   --   --   --  >1.10*  ?CREATININE 0.95  --   --  1.13* 0.97  --   --   --   --   ?TROPONINIHS 5  --  6  --   --   --   --   --   --   ? < > = values in this interval not displayed.  ? ? ?Assessment/Plan:  ?56yo female therapeutic on heparin with initial dosing while Eliquis on hold. Will continue infusion at current rate of 2000 units/hr and confirm stable with additional PTT.  ? ?Wynona Neat, PharmD, BCPS  ?11/24/2021,6:48 AM ? ? ?

## 2021-11-25 ENCOUNTER — Inpatient Hospital Stay (HOSPITAL_COMMUNITY): Payer: Medicaid Other

## 2021-11-25 DIAGNOSIS — E662 Morbid (severe) obesity with alveolar hypoventilation: Secondary | ICD-10-CM

## 2021-11-25 DIAGNOSIS — J9601 Acute respiratory failure with hypoxia: Secondary | ICD-10-CM | POA: Diagnosis not present

## 2021-11-25 DIAGNOSIS — G9341 Metabolic encephalopathy: Secondary | ICD-10-CM | POA: Diagnosis not present

## 2021-11-25 DIAGNOSIS — I5033 Acute on chronic diastolic (congestive) heart failure: Secondary | ICD-10-CM | POA: Diagnosis not present

## 2021-11-25 DIAGNOSIS — Z4682 Encounter for fitting and adjustment of non-vascular catheter: Secondary | ICD-10-CM | POA: Diagnosis not present

## 2021-11-25 LAB — HEPARIN LEVEL (UNFRACTIONATED)
Heparin Unfractionated: >1.1 IU/mL — ABNORMAL HIGH (ref 0.30–0.70)
Heparin Unfractionated: >1.1 IU/mL — ABNORMAL HIGH (ref 0.30–0.70)

## 2021-11-25 LAB — GLUCOSE, CAPILLARY
Glucose-Capillary: 102 mg/dL — ABNORMAL HIGH (ref 70–99)
Glucose-Capillary: 114 mg/dL — ABNORMAL HIGH (ref 70–99)
Glucose-Capillary: 118 mg/dL — ABNORMAL HIGH (ref 70–99)
Glucose-Capillary: 98 mg/dL (ref 70–99)

## 2021-11-25 LAB — APTT
aPTT: >200 seconds (ref 24–36)
aPTT: 57 seconds — ABNORMAL HIGH (ref 24–36)

## 2021-11-25 LAB — POCT I-STAT 7, (LYTES, BLD GAS, ICA,H+H)
Acid-Base Excess: 15 mmol/L — ABNORMAL HIGH (ref 0.0–2.0)
Bicarbonate: 41.8 mmol/L — ABNORMAL HIGH (ref 20.0–28.0)
Calcium, Ion: 1.13 mmol/L — ABNORMAL LOW (ref 1.15–1.40)
HCT: 42 % (ref 36.0–46.0)
Hemoglobin: 14.3 g/dL (ref 12.0–15.0)
O2 Saturation: 95 %
Patient temperature: 99.6
Potassium: 3.2 mmol/L — ABNORMAL LOW (ref 3.5–5.1)
Sodium: 137 mmol/L (ref 135–145)
TCO2: 43 mmol/L — ABNORMAL HIGH (ref 22–32)
pCO2 arterial: 59.3 mmHg — ABNORMAL HIGH (ref 32–48)
pH, Arterial: 7.458 — ABNORMAL HIGH (ref 7.35–7.45)
pO2, Arterial: 79 mmHg — ABNORMAL LOW (ref 83–108)

## 2021-11-25 LAB — CBC
HCT: 39.6 % (ref 36.0–46.0)
Hemoglobin: 12.8 g/dL (ref 12.0–15.0)
MCH: 29.8 pg (ref 26.0–34.0)
MCHC: 32.3 g/dL (ref 30.0–36.0)
MCV: 92.1 fL (ref 80.0–100.0)
Platelets: 200 10*3/uL (ref 150–400)
RBC: 4.3 MIL/uL (ref 3.87–5.11)
RDW: 15.3 % (ref 11.5–15.5)
WBC: 6 10*3/uL (ref 4.0–10.5)
nRBC: 0 % (ref 0.0–0.2)

## 2021-11-25 LAB — COMPREHENSIVE METABOLIC PANEL
ALT: 10 U/L (ref 0–44)
AST: 18 U/L (ref 15–41)
Albumin: 2.8 g/dL — ABNORMAL LOW (ref 3.5–5.0)
Alkaline Phosphatase: 48 U/L (ref 38–126)
Anion gap: 8 (ref 5–15)
BUN: 22 mg/dL — ABNORMAL HIGH (ref 6–20)
CO2: 36 mmol/L — ABNORMAL HIGH (ref 22–32)
Calcium: 8.4 mg/dL — ABNORMAL LOW (ref 8.9–10.3)
Chloride: 92 mmol/L — ABNORMAL LOW (ref 98–111)
Creatinine, Ser: 1.02 mg/dL — ABNORMAL HIGH (ref 0.44–1.00)
GFR, Estimated: >60 mL/min (ref >60)
Glucose, Bld: 106 mg/dL — ABNORMAL HIGH (ref 70–99)
Potassium: 3.4 mmol/L — ABNORMAL LOW (ref 3.5–5.1)
Sodium: 136 mmol/L (ref 135–145)
Total Bilirubin: 1.4 mg/dL — ABNORMAL HIGH (ref 0.3–1.2)
Total Protein: 6.7 g/dL (ref 6.5–8.1)

## 2021-11-25 LAB — PHOSPHORUS
Phosphorus: 4.1 mg/dL (ref 2.5–4.6)
Phosphorus: 3.5 mg/dL (ref 2.5–4.6)

## 2021-11-25 LAB — BASIC METABOLIC PANEL
Anion gap: 9 (ref 5–15)
BUN: 23 mg/dL — ABNORMAL HIGH (ref 6–20)
CO2: 36 mmol/L — ABNORMAL HIGH (ref 22–32)
Calcium: 8.1 mg/dL — ABNORMAL LOW (ref 8.9–10.3)
Chloride: 87 mmol/L — ABNORMAL LOW (ref 98–111)
Creatinine, Ser: 1.06 mg/dL — ABNORMAL HIGH (ref 0.44–1.00)
GFR, Estimated: >60 mL/min (ref >60)
Glucose, Bld: 105 mg/dL — ABNORMAL HIGH (ref 70–99)
Potassium: 3.3 mmol/L — ABNORMAL LOW (ref 3.5–5.1)
Sodium: 132 mmol/L — ABNORMAL LOW (ref 135–145)

## 2021-11-25 LAB — MAGNESIUM
Magnesium: 2 mg/dL (ref 1.7–2.4)
Magnesium: 1.8 mg/dL (ref 1.7–2.4)

## 2021-11-25 MED ORDER — POTASSIUM CHLORIDE 10 MEQ/50ML IV SOLN
10.0000 meq | INTRAVENOUS | Status: AC
  Administered 2021-11-25 (×4): 10 meq via INTRAVENOUS
  Filled 2021-11-25 (×4): qty 50

## 2021-11-25 MED ORDER — FENTANYL CITRATE PF 50 MCG/ML IJ SOSY: 200.0000 ug | PREFILLED_SYRINGE | Freq: Once | INTRAMUSCULAR | Status: DC

## 2021-11-25 MED ORDER — FUROSEMIDE 10 MG/ML IJ SOLN
60.0000 mg | Freq: Every day | INTRAMUSCULAR | Status: DC
  Administered 2021-11-26 – 2021-12-01 (×6): 60 mg via INTRAVENOUS
  Filled 2021-11-25 (×7): qty 6

## 2021-11-25 MED ORDER — MIDAZOLAM HCL 2 MG/2ML IJ SOLN: 5.0000 mg | Freq: Once | INTRAMUSCULAR | Status: DC

## 2021-11-25 MED ORDER — VITAL AF 1.2 CAL PO LIQD
1000.0000 mL | ORAL | Status: DC
  Administered 2021-11-25 – 2021-12-01 (×8): 1000 mL

## 2021-11-25 MED ORDER — ROCURONIUM BROMIDE 50 MG/5ML IV SOLN
100.0000 mg | Freq: Once | INTRAVENOUS | Status: DC
  Filled 2021-11-25: qty 10

## 2021-11-25 MED ORDER — POTASSIUM CHLORIDE 20 MEQ PO PACK
20.0000 meq | PACK | ORAL | Status: AC
  Administered 2021-11-25 (×2): 20 meq
  Filled 2021-11-25 (×2): qty 1

## 2021-11-25 MED ORDER — ETOMIDATE 2 MG/ML IV SOLN: 20.0000 mg | Freq: Once | INTRAVENOUS | Status: DC

## 2021-11-25 MED ORDER — HEPARIN (PORCINE) 25000 UT/250ML-% IV SOLN
2200.0000 [IU]/h | INTRAVENOUS | Status: DC
  Administered 2021-11-25: 1950 [IU]/h via INTRAVENOUS
  Administered 2021-11-26: 2200 [IU]/h via INTRAVENOUS
  Administered 2021-11-26 – 2021-11-27 (×2): 2250 [IU]/h via INTRAVENOUS
  Administered 2021-11-27 – 2021-11-28 (×3): 2200 [IU]/h via INTRAVENOUS
  Filled 2021-11-25 (×8): qty 250

## 2021-11-25 NOTE — Progress Notes (Signed)
Initial Nutrition Assessment ? ?DOCUMENTATION CODES:  ? ?Obesity unspecified ? ?INTERVENTION:  ? ?Tube feeds via Cortrak: ?Start Vital AF 1.2 @ 70 mL/hr (1680 mL/day) ?45 mL ProSource TF - BID ?Provides 2096 kcal, 148 gm protein, 1362 mL free water daily. (Total kcal/day with propofol = 2695 kcal/day) ? ?NUTRITION DIAGNOSIS:  ? ?Inadequate oral intake related to inability to eat as evidenced by NPO status. ? ?GOAL:  ? ?Patient will meet greater than or equal to 90% of their needs ? ?MONITOR:  ? ?Diet advancement, Vent status, Weight trends, Labs, I & O's, TF tolerance ? ?REASON FOR ASSESSMENT:  ? ?Consult ?Enteral/tube feeding initiation and management ? ?ASSESSMENT:  ? ?56 y.o. female presented to the ED with dyspnea. PMH includes COPD, HTN, and CHF. Pt admitted with acute on chronic heart failure and acute respiratory failure with hypoxia.  ? ?5/13 - intubated ?5/14 - TF initiated ?5/15 - Cortrak placed (tip post-pyloric) ? ?No family present at bedside.  ? ?Discussed case with RN outside of room. Still ongoing discussion with pt son regarding need for trach placement.  ? ?Patient is currently intubated on ventilator support. ?MV: 6.1 L/min ?Temp (24hrs), Avg:99.2 ?F (37.3 ?C), Min:98.6 ?F (37 ?C), Max:99.5 ?F (37.5 ?C) ?Continuous Medications:  ?Propofol: 22.7 ml/hr (599 kcal/day) ?Fentanyl ? ? ?Medications reviewed and include: Colace, Lasix (start 5/16), Miralax, Rocuronium ?Labs reviewed: Sodium 132, Potassium 3.3  ? ?NUTRITION - FOCUSED PHYSICAL EXAM: ? ?Flowsheet Row Most Recent Value  ?Orbital Region No depletion  ?Upper Arm Region No depletion  ?Thoracic and Lumbar Region No depletion  ?Buccal Region No depletion  ?Temple Region No depletion  ?Clavicle Bone Region Unable to assess  ?Clavicle and Acromion Bone Region Unable to assess  ?Scapular Bone Region Unable to assess  ?Dorsal Hand Unable to assess  ?Patellar Region Unable to assess  ?Anterior Thigh Region Unable to assess  ?Posterior Calf Region Mild  depletion  ?Edema (RD Assessment) None  ?Hair Reviewed  ?Eyes Unable to assess  ?Mouth Unable to assess  ?Skin Reviewed  ?Nails Unable to assess  ? ?Diet Order:   ?Diet Order   ? ?       ?  Diet NPO time specified  Diet effective now       ?  ? ?  ?  ? ?  ? ?EDUCATION NEEDS:  ? ?Not appropriate for education at this time ? ?Skin:  Skin Assessment: Reviewed RN Assessment ? ?Last BM:  5/11 ? ?Height:  ?Ht Readings from Last 1 Encounters:  ?11/24/21 _0  (1.727 m)  ? ?Weight:  ?Wt Readings from Last 1 Encounters:  ?11/25/21 (!) 175.5 kg  ? ?Ideal Body Weight:  63.6 kg ? ?BMI:  Body mass index is 58.84 kg/m?. ? ?Estimated Nutritional Needs:  ?Kcal:  2000-2200 ?Protein:  130-160 grams ?Fluid:  >/= 2 L ? ? ? ?Hermina Barters RD, LDN ?Clinical Dietitian ?See AMiON for contact information.  ?

## 2021-11-25 NOTE — Progress Notes (Signed)
RT NOTE: Patient does not meet SBT criteria this AM due to patient requiring 10 of PEEP at this time.  Tolerating current ventilator settings well at this time.  Will continue to monitor.  ?

## 2021-11-25 NOTE — Progress Notes (Signed)
eLink Physician-Brief Progress Note ?Patient Name: Jacqulyne Shepler ?DOB: [birth year] ?MRN: [id number] ? ? ?Date of Service ? 11/25/2021  ?HPI/Events of Note ? RN asking for restraints to be renewed.  She remains intubated.   ?eICU Interventions ? Wrist restraints renewed.  ? ? ? ?Intervention Category ?Minor Interventions: Other: ? ?Elsie Lincoln ?11/25/2021, 10:46 PM ?

## 2021-11-25 NOTE — Progress Notes (Signed)
1117 - sedation turned down so family can attempt discussion with pt regarding trach.  ? ?1210 - sedation returned to 100%, pt breathing about 36-38 times a minute consistently and in pain. Family at bedside and aware.  ?

## 2021-11-25 NOTE — Progress Notes (Addendum)
? ?NAMEIrish Breisch, MRN:  [id number], DOB:  May 29, 1966, LOS: 4 ?ADMISSION DATE:  11/21/2021, CONSULTATION DATE: 11/23/2021 ?REFERRING MD:  Benson Norway, CHIEF COMPLAINT: Obtundation ? ?History of Present Illness:  ?56 year old woman who was admitted with increasing shortness of breath.  Now in hypercarbic respiratory failure with increasing obtundation. ? ?Unable to provide history at this time.  Spoke with niece who reports increasing shortness of breath over the last few days.  No other associated symptoms.  Prior admissions for similar but has never been intubated. ? ?Initially no hypercarbia.  Now PCO2 in the 70s.  Was initially responding to questions this morning but is no longer doing so. ?She has not been using CPAP for several years now has her machine has broken down and she is awaiting a repeat sleep study. ? ?Pertinent  Medical History  ? ?Past Medical History:  ?Diagnosis Date  ? DVT (deep vein thrombosis) in pregnancy   ? RLE DVT 02/2016  ? Dyspnea   ? Morbid obesity ([place])   ? PE (pulmonary embolism) 02/29/2016  ? Sleep apnea   ? uses a cpap  ? ? ?Significant Hospital Events: ?Including procedures, antibiotic start and stop dates in addition to other pertinent events   ?5/11 initial admission to hospitalist service.  Started on BiPAP and diuresed successfully for 5 L. ?5/13 increasing somnolence and hypercarbia new since admission. ? ?Interim History / Subjective:  ?intubated  ? ?Objective   ?Blood pressure 105/64, pulse 61, temperature 99.4 ?F (37.4 ?C), temperature source Axillary, resp. rate 17, height _0  (1.727 m), weight (!) 175.5 kg, last menstrual period 11/29/2015, SpO2 96 %. ?   ?Vent Mode: PRVC ?FiO2 (%):  [40 %] 40 % ?Set Rate:  [0 bmp-12 bmp] 12 bmp ?Vt Set:  [380 mL] 380 mL ?PEEP:  [10 cmH20] 10 cmH20 ?Plateau Pressure:  [23 cmH20-30 cmH20] 27 cmH20  ? ?Intake/Output Summary (Last 24 hours) at 11/25/2021 1130 ?Last data filed at 11/25/2021 1100 ?Gross per 24 hour  ?Intake 1730.16 ml   ?Output 3000 ml  ?Net -1269.84 ml  ? ?Filed Weights  ? 11/23/21 0543 11/24/21 0500 11/25/21 0434  ?Weight: (!) 188.9 kg (!) 190 kg (!) 175.5 kg  ? ? ?Examination: ?Gen:      Intubated, sedated, acutely ill appearing, large, no neck ?HEENT:  ETT to vent ?Lungs:    sounds of mechanical ventilation auscultated, diminished ?CV:         RRR no mrg ?Abd:      + bowel sounds; soft, non-tender; no palpable masses, no distension ?Ext:    No edema ?Skin:      Warm and dry; no rashes ?Neuro:   sedated, RASS -1, moves all 4 extremities ? ? ?Labs reviewed: ?Na 132 ?K 3.3 ?Cr 1.06 ? ?PTT>200 but drawn from line where heparin was infusing ?Abd xray shows KUB in appropriate position ? ?Assessment & Plan:  ? ?Acute respiratory failure with hypoxia and hypercapnia (HCC) ?Untreated OHS/OSA ?-LTVV, VAP bundle, PAD protocol ?-attempt to transition to prn fentanyl and propofol for target RASS -1 ?-discussed trach with son this morning at bedside. He would like to give his mom a trial of extubation before committing to trach. She is not appropriate for extubation today. Will titrate down PEEP and vent settings to PEEP 8. Will assess daily for readiness for extubation to bipap. If she does not succeed with extubation he is agreeable to trach if necessary.  ?- Known history of OSA with last sleep study  5 years ago.  Patient confirms non adherence to therapy . ? ?Acute metabolic encephalopathy ?Due to hypercarbia.  ?- Vent mgmt as below ? ?History of pulmonary embolus (PE) ?- heparin gtt for now with plan for trach in next day or 2 ? ?Acute on chronic heart failure with preserved ejection fraction (HFpEF) ([place]) ?-hold home amlodipine ?-Diurese for net negative fluid balance ?- will decrease to 60 mg daily ? ?Hyperlipidemia ?-home atorvastatin ? ?Best Practice (right click and "Reselect all SmartList Selections" daily)  ? ?Diet/type: NPO, tube feeds ?DVT prophylaxis: systemic heparin ?GI prophylaxis: N/A ?Lines: N/A - place PICC  today ?Foley:  N/A ?Code Status:  full code ?Last date of multidisciplinary goals of care discussion [Updated's son and his wife this morning and discussed likelihood for tracheostomy with resultant vent SNF as disposition.] ? ?CRITICAL CARE ? ?The patient is critically ill due to respiratory failure.  Critical care was necessary to treat or prevent imminent or life-threatening deterioration.  Critical care was time spent personally by me on the following activities: development of treatment plan with patient and/or surrogate as well as nursing, discussions with consultants, evaluation of patient's response to treatment, examination of patient, obtaining history from patient or surrogate, ordering and performing treatments and interventions, ordering and review of laboratory studies, ordering and review of radiographic studies, pulse oximetry, re-evaluation of patient's condition and participation in multidisciplinary rounds.  ? ?Critical Care Time devoted to patient care services described in this note is 62 minutes. This time reflects time of care of this [facility] . This critical care time does not reflect separately billable procedures or procedure time, teaching time or supervisory time of PA/NP/Med student/Med Resident etc but could involve care discussion time.   ?   ? ?Spero Geralds ?[facility] ?11/25/2021 11:31 AM  ?Pager: see AMION ? ?If no response to pager , please call critical care on call (see AMION) until 7pm ?After 7:00 pm call [place]   ? ? ? ? ?

## 2021-11-25 NOTE — Progress Notes (Signed)
[facility] ADULT ICU REPLACEMENT PROTOCOL ? ? ?The patient does apply for the [facility] Adult ICU Electrolyte Replacment Protocol based on the criteria listed below:  ? ?1.Exclusion criteria: TCTS patients, ECMO patients, and Dialysis patients ?2. Is GFR >/= 30 ml/min? Yes.    ?Patient's GFR today is >60 ?3. Is SCr </= 2? Yes.   ?Patient's SCr is 1.06 mg/dL ?4. Did SCr increase >/= 0.5 in 24 hours? No. ?5.Pt's weight >40kg  Yes.   ?6. Abnormal electrolyte(s): K  ?7. Electrolytes replaced per protocol ?8.  Call MD STAT for K+ </= 2.5, Phos </= 1, or Mag </= 1 ?Physician:  James Ivanoff ? ?Angel Brewer 11/25/2021 5:50 AM  ?

## 2021-11-25 NOTE — Progress Notes (Signed)
Heart Failure Stewardship Pharmacist Progress Note ? ? ?PCP: [name], NP ?PCP-Cardiologist: None  ? ? ?HPI:  ?56 yo female with PMH of OSA on CPAP, diastolic CHF, HLD, and DVT 2/2 pregnancy 2017, and pulmonary HTN. Presented to ED with progressive dyspnea at rest and on exertion, orthopnea with LEE on exam. CXR with moderate interstitial lung markings indicative of moderate/severe CHF and pulmonary edema. Echo 05/12 with LVEF 60-65%, mild LVH, normal RV - unchanged since 04/2021 Echo. Adherent to PTA furosemide. Started on IV diuretics. Became less responsive and developed respiratory acidosis despite being on bipap and was then intubated on 5/13. Ongoing discussions for extubation vs trach.  ? ?Current HF Medications: ?Diuretic: furosemide 60 mg IV daily ? ?Prior to admission HF Medications: ?Diuretic: furosemide 40 mg daily ? ?Pertinent Lab Values: ?Serum creatinine 1.06 (baseline <1), BUN 23, Potassium 3.3, Sodium 132, BNP 139.2, Magnesium 2.0 ? ?Vital Signs: ?Weight: 387 lbs - question accuracy with weight of 418 lbs yesterday (admission weight: 433.4 lbs)  ?05/07/2021 discharge weight: 401 lb ?Blood pressure: 110/70s  ?Heart rate: 60-70s  ?I/O: -3.3L yesterday, 2.2L so far today; net -9.5L ? ?Medication Assistance / Insurance Benefits Check: ?Does the patient have prescription insurance?  Yes ?Type of insurance plan: Managed Medicaid ? ?Outpatient Pharmacy:  ?[facility] to admission [facility] ?Is the patient willing to use [facility] at discharge? Pending ?Is the patient willing to transition their outpatient pharmacy to utilize a [facility] outpatient pharmacy?   Pending ?  ? ?Assessment: ?1. Acute on chronic diastolic CHF (LVEF 70-17%). NYHA class IV symptoms. ?Good response to IV diuresis. ?- Continue furosemide IV 60 mg daily. 80 mEq KCl given. ?- Off amlodipine ?- Further GDMT optimization pending improvement with respiratory status ?  ?Plan: ?1) Medication changes recommended at  this time: ?- Continue current regimen ? ?2) Patient assistance: ?- none pending ? ?3)  Education  ?- To be completed prior to discharge ? ?Kerby Nora, PharmD, BCPS ?Heart Failure Stewardship Pharmacist ?Phone [phone] ? ?Please check [email or web address] for unit-specific pharmacist phone numbers ? ? ?

## 2021-11-25 NOTE — Progress Notes (Signed)
ANTICOAGULATION CONSULT NOTE ? ?Pharmacy Consult for heparin ?Indication: pulmonary embolus ? ?No Known Allergies ? ?Patient Measurements: ?Height: _0  (172.7 cm) ?Weight: (!) 175.5 kg (387 lb) ?IBW/kg (Calculated) : 63.9 ?Heparin Dosing Weight: 114kg ? ?Vital Signs: ?Temp: 99.4 ?F (37.4 ?C) (05/15 0810) ?Temp Source: Axillary (05/15 0810) ?BP: 119/54 (05/15 0810) ?Pulse Rate: 66 (05/15 0810) ? ?Labs: ?Recent Labs  ?  11/23/21 ?5500 11/23/21 ?1642 11/24/21 ?0601 11/24/21 ?9037 11/24/21 ?1301 11/24/21 ?1307 11/24/21 ?2028 11/25/21 ?0353 11/25/21 ?0440 11/25/21 ?0615  ?HGB 12.9   < >  --  13.3   < >  --  13.6 14.3 12.8  --   ?HCT 45.3   < >  --  40.7   < >  --  40.0 42.0 39.6  --   ?PLT 183  --   --  203  --   --   --   --  200  --   ?APTT  --   --  98*  --   --  103*  --   --   --  >200*  ?HEPARINUNFRC  --   --  >1.10*  --   --   --   --   --  >1.10* >1.10*  ?CREATININE 0.97  --  1.02* 1.10*  --   --   --   --  1.06*  --   ? < > = values in this interval not displayed.  ? ? ? ?Estimated Creatinine Clearance: 101.5 mL/min (A) (by C-G formula based on SCr of 1.06 mg/dL (H)). ? ? ?Medical History: ?Past Medical History:  ?Diagnosis Date  ? DVT (deep vein thrombosis) in pregnancy   ? RLE DVT 02/2016  ? Dyspnea   ? Morbid obesity ([place])   ? PE (pulmonary embolism) 02/29/2016  ? Sleep apnea   ? uses a cpap  ? ?Medications:  ?Infusions:  ? sodium chloride 250 mL (11/25/21 9558)  ? fentaNYL infusion INTRAVENOUS 50 mcg/hr (11/25/21 0600)  ? norepinephrine (LEVOPHED) Adult infusion    ? potassium chloride 10 mEq (11/25/21 0640)  ? propofol (DIPRIVAN) infusion 20 mcg/kg/min (11/25/21 0754)  ? ? ?Assessment: ?56 yo female on PTA Eliquis for her hx of PE. Transferred to ICU for intubation. Her last dose was 5/13 in AM. Pharmacy to dose heparin.  ? ?Heparin level and aPTT elevated this am but drawn inappropriately. RN moving heparin to peripheral. Heparin now off for trach so will not redraw. ? ?Goal of Therapy:  ?Heparin level  0.3-0.7 units/ml ?aPTT 66-102 seconds ?Monitor platelets by anticoagulation protocol: Yes ?  ?Plan:  ?Heparin on hold for trach - F/U restarting after ? ?Arrie Senate, PharmD, BCPS, BCCP ?Clinical Pharmacist ?[phone] ?Please check AMION for all [place] numbers ?11/25/2021 ? ? ?

## 2021-11-25 NOTE — Progress Notes (Signed)
ANTICOAGULATION CONSULT NOTE ? ?Pharmacy Consult for heparin ?Indication: pulmonary embolus ? ?No Known Allergies ? ?Patient Measurements: ?Height: 5' 8" (172.7 cm) ?Weight: (!) 175.5 kg (387 lb) ?IBW/kg (Calculated) : 63.9 ?Heparin Dosing Weight: 114kg ? ?Vital Signs: ?Temp: 99.1 ?F (37.3 ?C) (05/15 1900) ?Temp Source: Oral (05/15 1900) ?BP: 117/63 (05/15 2050) ?Pulse Rate: 69 (05/15 2050) ? ?Labs: ?Recent Labs  ?  11/23/21 ?4136 11/23/21 ?4383 11/24/21 ?0601 11/24/21 ?7793 11/24/21 ?1301 11/24/21 ?1307 11/24/21 ?2028 11/25/21 ?0353 11/25/21 ?0440 11/25/21 ?0615 11/25/21 ?1835 11/25/21 ?2045  ?HGB 12.9   < >  --  13.3   < >  --  13.6 14.3 12.8  --   --   --   ?HCT 45.3   < >  --  40.7   < >  --  40.0 42.0 39.6  --   --   --   ?PLT 183  --   --  203  --   --   --   --  200  --   --   --   ?APTT  --    < > 98*  --   --  103*  --   --   --  >200*  --  57*  ?HEPARINUNFRC  --   --  >1.10*  --   --   --   --   --  >1.10* >1.10*  --   --   ?CREATININE 0.97  --  1.02* 1.10*  --   --   --   --  1.06*  --  1.02*  --   ? < > = values in this interval not displayed.  ? ? ? ?Estimated Creatinine Clearance: 105.5 mL/min (A) (by C-G formula based on SCr of 1.02 mg/dL (H)). ? ? ?Medical History: ?Past Medical History:  ?Diagnosis Date  ? DVT (deep vein thrombosis) in pregnancy   ? RLE DVT 02/2016  ? Dyspnea   ? Morbid obesity ([place])   ? PE (pulmonary embolism) 02/29/2016  ? Sleep apnea   ? uses a cpap  ? ?Medications:  ?Infusions:  ? sodium chloride Stopped (11/25/21 1243)  ? feeding supplement (VITAL AF 1.2 CAL) 50 mL/hr at 11/25/21 1900  ? fentaNYL infusion INTRAVENOUS 50 mcg/hr (11/25/21 1900)  ? heparin 1,950 Units/hr (11/25/21 1900)  ? norepinephrine (LEVOPHED) Adult infusion    ? propofol (DIPRIVAN) infusion 20 mcg/kg/min (11/25/21 1900)  ? ? ?Assessment: ?56 yo female on PTA Eliquis for her hx of PE. Transferred to ICU for intubation. Her last dose was 5/13 in AM. Pharmacy to dose heparin.  ? ?Aptt= 57 on heparin 1950  units/hr ? ?Goal of Therapy:  ?Heparin level 0.3-0.7 units/ml ?aPTT 66-102 seconds ?Monitor platelets by anticoagulation protocol: Yes ?  ?Plan:  ?-Increase heparin to 2200 units/hr ?-Heparin level and CBC in am ? ?Hildred Laser, PharmD ?Clinical Pharmacist ?**Pharmacist phone directory can now be found on [email or web address] ([email or web address]).  Listed under [place]. ? ? ? ?

## 2021-11-25 NOTE — Procedures (Signed)
Cortrak  Person Inserting Tube:  Esaw Dace, RD Tube Type:  Cortrak - 43 inches Tube Size:  10 Tube Location:  Right nare Initial Placement:  Stomach Secured by: Bridle Technique Used to Measure Tube Placement:  Marking at nare/corner of mouth Cortrak Secured At:  70 cm Procedure Comments:  Cortrak Tube Team Note:  Consult received to place a Cortrak feeding tube.   X-ray is required, abdominal x-ray has been ordered by the Cortrak team. Please confirm tube placement before using the Cortrak tube.   If the tube becomes dislodged please keep the tube and contact the Cortrak team at [email or web address] (password [email or web address]) for replacement.  If after hours and replacement cannot be delayed, place a NG tube and confirm placement with an abdominal x-ray.    Kerman Passey MS, RDN, LDN, CNSC Registered Dietitian III Clinical Nutrition RD Pager and On-Call Pager Number Located in [place]

## 2021-11-26 DIAGNOSIS — J9601 Acute respiratory failure with hypoxia: Secondary | ICD-10-CM

## 2021-11-26 DIAGNOSIS — I5033 Acute on chronic diastolic (congestive) heart failure: Secondary | ICD-10-CM | POA: Diagnosis not present

## 2021-11-26 DIAGNOSIS — G9341 Metabolic encephalopathy: Secondary | ICD-10-CM | POA: Diagnosis not present

## 2021-11-26 DIAGNOSIS — E662 Morbid (severe) obesity with alveolar hypoventilation: Secondary | ICD-10-CM | POA: Diagnosis not present

## 2021-11-26 LAB — CBC
HCT: 40.5 % (ref 36.0–46.0)
Hemoglobin: 12.9 g/dL (ref 12.0–15.0)
MCH: 29.6 pg (ref 26.0–34.0)
MCHC: 31.9 g/dL (ref 30.0–36.0)
MCV: 92.9 fL (ref 80.0–100.0)
Platelets: 174 10*3/uL (ref 150–400)
RBC: 4.36 MIL/uL (ref 3.87–5.11)
RDW: 15.2 % (ref 11.5–15.5)
WBC: 6.6 10*3/uL (ref 4.0–10.5)
nRBC: 0 % (ref 0.0–0.2)

## 2021-11-26 LAB — GLUCOSE, CAPILLARY
Glucose-Capillary: 108 mg/dL — ABNORMAL HIGH (ref 70–99)
Glucose-Capillary: 112 mg/dL — ABNORMAL HIGH (ref 70–99)
Glucose-Capillary: 131 mg/dL — ABNORMAL HIGH (ref 70–99)
Glucose-Capillary: 134 mg/dL — ABNORMAL HIGH (ref 70–99)
Glucose-Capillary: 138 mg/dL — ABNORMAL HIGH (ref 70–99)

## 2021-11-26 LAB — BASIC METABOLIC PANEL
Anion gap: 8 (ref 5–15)
BUN: 23 mg/dL — ABNORMAL HIGH (ref 6–20)
CO2: 33 mmol/L — ABNORMAL HIGH (ref 22–32)
Calcium: 8.2 mg/dL — ABNORMAL LOW (ref 8.9–10.3)
Chloride: 89 mmol/L — ABNORMAL LOW (ref 98–111)
Creatinine, Ser: 0.82 mg/dL (ref 0.44–1.00)
GFR, Estimated: >60 mL/min (ref >60)
Glucose, Bld: 122 mg/dL — ABNORMAL HIGH (ref 70–99)
Potassium: 4.1 mmol/L (ref 3.5–5.1)
Sodium: 130 mmol/L — ABNORMAL LOW (ref 135–145)

## 2021-11-26 LAB — MRSA NEXT GEN BY PCR, NASAL: MRSA by PCR Next Gen: NOT DETECTED

## 2021-11-26 LAB — MAGNESIUM: Magnesium: 2.2 mg/dL (ref 1.7–2.4)

## 2021-11-26 LAB — APTT
aPTT: 66 seconds — ABNORMAL HIGH (ref 24–36)
aPTT: 83 seconds — ABNORMAL HIGH (ref 24–36)

## 2021-11-26 LAB — HEPARIN LEVEL (UNFRACTIONATED): Heparin Unfractionated: 0.41 IU/mL (ref 0.30–0.70)

## 2021-11-26 LAB — PHOSPHORUS: Phosphorus: 4 mg/dL (ref 2.5–4.6)

## 2021-11-26 MED ORDER — DEXMEDETOMIDINE HCL IN NACL 400 MCG/100ML IV SOLN
0.4000 ug/kg/h | INTRAVENOUS | Status: DC
  Administered 2021-11-26: 0.4 ug/kg/h via INTRAVENOUS
  Administered 2021-11-26: 0.3 ug/kg/h via INTRAVENOUS
  Administered 2021-11-26: 0.4 ug/kg/h via INTRAVENOUS
  Administered 2021-11-27: 0.6 ug/kg/h via INTRAVENOUS
  Administered 2021-11-27: 0.2 ug/kg/h via INTRAVENOUS
  Administered 2021-11-27: 0.6 ug/kg/h via INTRAVENOUS
  Administered 2021-11-28: 0.2 ug/kg/h via INTRAVENOUS
  Administered 2021-11-28: 0.1 ug/kg/h via INTRAVENOUS
  Filled 2021-11-26 (×9): qty 100

## 2021-11-26 MED ORDER — PANTOPRAZOLE 2 MG/ML SUSPENSION
40.0000 mg | Freq: Every day | ORAL | Status: DC
  Administered 2021-11-26 – 2021-12-10 (×15): 40 mg
  Filled 2021-11-26 (×15): qty 20

## 2021-11-26 MED ORDER — BISACODYL 10 MG RE SUPP
10.0000 mg | Freq: Once | RECTAL | Status: AC
  Administered 2021-11-26: 10 mg via RECTAL
  Filled 2021-11-26: qty 1

## 2021-11-26 MED ORDER — SENNOSIDES-DOCUSATE SODIUM 8.6-50 MG PO TABS
1.0000 | ORAL_TABLET | Freq: Every day | ORAL | Status: DC
  Administered 2021-11-26 – 2021-12-04 (×8): 1
  Filled 2021-11-26 (×9): qty 1

## 2021-11-26 NOTE — Progress Notes (Signed)
Heart Failure Stewardship Pharmacist Progress Note ? ? ?PCP: [name], NP ?PCP-Cardiologist: None  ? ? ?HPI:  ?56 yo female with PMH of OSA on CPAP, diastolic CHF, HLD, and DVT 2/2 pregnancy 2017, and pulmonary HTN. Presented to ED with progressive dyspnea at rest and on exertion, orthopnea with LEE on exam. CXR with moderate interstitial lung markings indicative of moderate/severe CHF and pulmonary edema. Echo 05/12 with LVEF 60-65%, mild LVH, normal RV - unchanged since 04/2021 Echo. Adherent to PTA furosemide. Started on IV diuretics. Became less responsive and developed respiratory acidosis despite being on bipap and was then intubated on 5/13. Ongoing discussions for extubation vs trach - holding off on extubating today but family okay with trach if necessary. ? ?Current HF Medications: ?Diuretic: furosemide 60 mg IV daily ? ?Prior to admission HF Medications: ?Diuretic: furosemide 40 mg daily ? ?Pertinent Lab Values: ?Serum creatinine 0.82 (baseline <1), BUN 23, Potassium 4.1, Sodium 130, BNP 139.2, Magnesium 2.2 ? ?Vital Signs: ?Weight: 412 lbs - (admission weight: 433.4 lbs)  ?05/07/2021 discharge weight: 401 lb ?Blood pressure: 100-10/70s  ?Heart rate: 50-60s ?I/O: -2.7L yesterday, 2.2L so far today; net -9.3L ? ?Medication Assistance / Insurance Benefits Check: ?Does the patient have prescription insurance?  Yes ?Type of insurance plan: Managed Medicaid ? ?Outpatient Pharmacy:  ?[facility] ?Is the patient willing to use [place] at discharge? Pending ?Is the patient willing to transition their outpatient pharmacy to utilize a [facility] outpatient pharmacy?   Pending ?  ? ?Assessment: ?1. Acute on chronic diastolic CHF (LVEF 70-62%). NYHA class IV symptoms. ?Good response to IV diuresis. ?- Continue furosemide IV 60 mg daily.  ?- Off amlodipine ?- Further GDMT optimization pending improvement with respiratory status ?  ?Plan: ?1) Medication changes  recommended at this time: ?- Continue current regimen ? ?2) Patient assistance: ?- none pending ? ?3)  Education  ?- To be completed prior to discharge ? ?Laurey Arrow, PharmD ?PGY1 Pharmacy Resident ?11/26/2021  11:34 AM ?

## 2021-11-26 NOTE — Progress Notes (Signed)
? ?NAMEMeyer Brewer, MRN:  [id number], DOB:  05/08/1966, LOS: 5 ?ADMISSION DATE:  11/21/2021, CONSULTATION DATE: 11/23/2021 ?REFERRING MD:  Benson Norway, CHIEF COMPLAINT: Obtundation ? ?History of Present Illness:  ?56 year old woman who was admitted with increasing shortness of breath.  Now in hypercarbic respiratory failure with increasing obtundation. ? ?Unable to provide history at this time.  Spoke with niece who reports increasing shortness of breath over the last few days.  No other associated symptoms.  Prior admissions for similar but has never been intubated. ? ?Initially no hypercarbia.  Now PCO2 in the 70s.  Was initially responding to questions this morning but is no longer doing so. ?She has not been using CPAP for several years now has her machine has broken down and she is awaiting a repeat sleep study. ? ?Pertinent  Medical History  ? ?Past Medical History:  ?Diagnosis Date  ? DVT (deep vein thrombosis) in pregnancy   ? RLE DVT 02/2016  ? Dyspnea   ? Morbid obesity ([place])   ? PE (pulmonary embolism) 02/29/2016  ? Sleep apnea   ? uses a cpap  ? ? ?Significant Hospital Events: ?Including procedures, antibiotic start and stop dates in addition to other pertinent events   ?5/11 initial admission to hospitalist service.  Started on BiPAP and diuresed successfully for 5 L. ?5/13 increasing somnolence and hypercarbia new since admission. ? ?Interim History / Subjective:  ?Failed SBT this morning for tachypnia ? ?Objective   ?Blood pressure 106/76, pulse 61, temperature 99.9 ?F (37.7 ?C), temperature source Oral, resp. rate (!) 21, height 5' 8" (1.727 m), weight (!) 186.9 kg, last menstrual period 11/29/2015, SpO2 (!) 89 %. ?   ?Vent Mode: PRVC ?FiO2 (%):  [40 %] 40 % ?Set Rate:  [12 bmp] 12 bmp ?Vt Set:  [380 mL] 380 mL ?PEEP:  [5 cmH20-10 cmH20] 5 cmH20 ?Pressure Support:  [8 cmH20] 8 cmH20 ?Plateau Pressure:  [26 cmH20-30 cmH20] 26 cmH20  ? ?Intake/Output Summary (Last 24 hours) at 11/26/2021 1218 ?Last  data filed at 11/26/2021 1200 ?Gross per 24 hour  ?Intake 1793.75 ml  ?Output 2375 ml  ?Net -581.25 ml  ? ?Filed Weights  ? 11/24/21 0500 11/25/21 0434 11/26/21 0456  ?Weight: (!) 190 kg (!) 175.5 kg (!) 186.9 kg  ? ? ?Examination: ?Gen:      Intubated, sedated, acutely ill appearing ?HEENT:  ETT to vent ?Lungs:    sounds of mechanical ventilation auscultated, diminished ?CV:         RRR no mrg ?Abd:     Obese, soft ?Ext:    No edema ?Skin:      Warm and dry; no rashes ?Neuro:   sedated, RASS 0 to -1, moves all 4 extremities ? ? ?Labs reviewed: ?Diuresed well with IV lasix ?Cr normalized ?Hgb stable ?Na 130 ? ?Assessment & Plan:  ? ?Acute respiratory failure with hypoxia and hypercapnia (HCC) ?Untreated OHS/OSA ?-LTVV, VAP bundle, PAD protocol ?-will transition from propofol to precedex with fentanyl gtt ?- maintain minimum PEEP of 8 ? ?History of pulmonary embolus (PE) ?- heparin gtt for now with plan for trach in next day or 2 ? ?Acute on chronic heart failure with preserved ejection fraction (HFpEF) ([place]) ?-hold home amlodipine ?-Diurese for net negative fluid balance ?- continue lasix 60 mg daily IV ? ?Hyperlipidemia ?-home atorvastatin ? ?Best Practice (right click and "Reselect all SmartList Selections" daily)  ? ?Diet/type: NPO, tube feeds ?DVT prophylaxis: systemic heparin ?GI prophylaxis: N/A ?Lines: N/A -  place PICC today ?Foley:  N/A ?Code Status:  full code ?Last date of multidisciplinary goals of care discussion [Updated's son and his wife this morning and discussed likelihood for tracheostomy with resultant vent SNF as disposition. They are agreeable to trach but want to give her a trial of extubation first.] ? ?CRITICAL CARE ? ?The patient is critically ill due to respiratory failure.  Critical care was necessary to treat or prevent imminent or life-threatening deterioration.  Critical care was time spent personally by me on the following activities: development of treatment plan with patient and/or  surrogate as well as nursing, discussions with consultants, evaluation of patient's response to treatment, examination of patient, obtaining history from patient or surrogate, ordering and performing treatments and interventions, ordering and review of laboratory studies, ordering and review of radiographic studies, pulse oximetry, re-evaluation of patient's condition and participation in multidisciplinary rounds.  ? ?Critical Care Time devoted to patient care services described in this note is 40 minutes. This time reflects time of care of this [place] . This critical care time does not reflect separately billable procedures or procedure time, teaching time or supervisory time of PA/NP/Med student/Med Resident etc but could involve care discussion time.   ?   ? ?Spero Geralds ?[facility] ?11/26/2021 12:18 PM  ?Pager: see AMION ? ?If no response to pager , please call critical care on call (see AMION) until 7pm ?After 7:00 pm call Elink   ? ? ? ? ?

## 2021-11-26 NOTE — Progress Notes (Signed)
ANTICOAGULATION CONSULT NOTE ? ?Pharmacy Consult for heparin ?Indication: pulmonary embolus ? ?No Known Allergies ? ?Patient Measurements: ?Height: _0  (172.7 cm) ?Weight: (!) 186.9 kg (412 lb) ?IBW/kg (Calculated) : 63.9 ?Heparin Dosing Weight: 114kg ? ?Vital Signs: ?Temp: 99.2 ?F (37.3 ?C) (05/16 0000) ?Temp Source: Oral (05/16 0000) ?BP: 122/65 (05/16 7356) ?Pulse Rate: 74 (05/16 0337) ? ?Labs: ?Recent Labs  ?  11/24/21 ?7014 11/24/21 ?1301 11/25/21 ?0353 11/25/21 ?0440 11/25/21 ?0615 11/25/21 ?1835 11/25/21 ?2045 11/26/21 ?0355  ?HGB 13.3   < > 14.3 12.8  --   --   --  12.9  ?HCT 40.7   < > 42.0 39.6  --   --   --  40.5  ?PLT 203  --   --  200  --   --   --  174  ?APTT  --    < >  --   --  >200*  --  57* 66*  ?HEPARINUNFRC  --   --   --  >1.10* >1.10*  --   --  0.41  ?CREATININE 1.10*  --   --  1.06*  --  1.02*  --  0.82  ? < > = values in this interval not displayed.  ? ? ? ?Estimated Creatinine Clearance: 136.8 mL/min (by C-G formula based on SCr of 0.82 mg/dL). ? ? ?Medical History: ?Past Medical History:  ?Diagnosis Date  ? DVT (deep vein thrombosis) in pregnancy   ? RLE DVT 02/2016  ? Dyspnea   ? Morbid obesity ([place])   ? PE (pulmonary embolism) 02/29/2016  ? Sleep apnea   ? uses a cpap  ? ?Medications:  ?Infusions:  ? sodium chloride Stopped (11/25/21 1243)  ? feeding supplement (VITAL AF 1.2 CAL) 60 mL/hr at 11/26/21 0215  ? fentaNYL infusion INTRAVENOUS 50 mcg/hr (11/26/21 0200)  ? heparin 2,200 Units/hr (11/26/21 0456)  ? norepinephrine (LEVOPHED) Adult infusion    ? propofol (DIPRIVAN) infusion 20 mcg/kg/min (11/26/21 0200)  ? ? ?Assessment: ?56 yo female on PTA Eliquis for her hx of PE. Transferred to ICU for intubation. Her last dose was 5/13 in AM. Pharmacy to dose heparin.  ? ?Most recent aPTT is therapeutic at 66 and heparin level is therapeutic at 0.41 on heparin 2200 units/hr. No issues noted per chart review. ? ?Goal of Therapy:  ?Heparin level 0.3-0.7 units/ml ?aPTT 66-102 seconds ?Monitor  platelets by anticoagulation protocol: Yes ?  ?Plan:  ?Empirically increase to heparin 2250 units/hr to stay within goal ?6h confirmatory aPTT ?Daily aPTT and heparin level until correlating ?Monitor for s/sx of bleeding ? ?Thank you for including pharmacy in the care of this patient. ? ?Zenaida Deed, PharmD ?PGY1 Acute Care Pharmacy Resident  ?Phone: [phone] ?11/26/2021  06:26 AM ? ?Please check [email or web address] for unit-specific pharmacy phone numbers. ?________________________ ? ?Addendum 11/26/2021 1:39 PM: ?Confirmatory aPTT is therapeutic at 83. Will continue current rate and proceed with daily monitoring.  ? ?Zenaida Deed, PharmD ?11/26/2021  1:39 PM ? ? ? ? ? ?

## 2021-11-27 ENCOUNTER — Other Ambulatory Visit (HOSPITAL_COMMUNITY): Payer: Self-pay

## 2021-11-27 DIAGNOSIS — J9601 Acute respiratory failure with hypoxia: Secondary | ICD-10-CM | POA: Diagnosis not present

## 2021-11-27 DIAGNOSIS — I5033 Acute on chronic diastolic (congestive) heart failure: Secondary | ICD-10-CM | POA: Diagnosis not present

## 2021-11-27 DIAGNOSIS — G9341 Metabolic encephalopathy: Secondary | ICD-10-CM | POA: Diagnosis not present

## 2021-11-27 DIAGNOSIS — E662 Morbid (severe) obesity with alveolar hypoventilation: Secondary | ICD-10-CM | POA: Diagnosis not present

## 2021-11-27 LAB — CBC
HCT: 40.4 % (ref 36.0–46.0)
Hemoglobin: 12.3 g/dL (ref 12.0–15.0)
MCH: 28.2 pg (ref 26.0–34.0)
MCHC: 30.4 g/dL (ref 30.0–36.0)
MCV: 92.7 fL (ref 80.0–100.0)
Platelets: 168 10*3/uL (ref 150–400)
RBC: 4.36 MIL/uL (ref 3.87–5.11)
RDW: 14.9 % (ref 11.5–15.5)
WBC: 6.7 10*3/uL (ref 4.0–10.5)
nRBC: 0 % (ref 0.0–0.2)

## 2021-11-27 LAB — BASIC METABOLIC PANEL
Anion gap: 7 (ref 5–15)
BUN: 27 mg/dL — ABNORMAL HIGH (ref 6–20)
CO2: 34 mmol/L — ABNORMAL HIGH (ref 22–32)
Calcium: 8.5 mg/dL — ABNORMAL LOW (ref 8.9–10.3)
Chloride: 96 mmol/L — ABNORMAL LOW (ref 98–111)
Creatinine, Ser: 0.89 mg/dL (ref 0.44–1.00)
GFR, Estimated: >60 mL/min (ref >60)
Glucose, Bld: 135 mg/dL — ABNORMAL HIGH (ref 70–99)
Potassium: 3.6 mmol/L (ref 3.5–5.1)
Sodium: 137 mmol/L (ref 135–145)

## 2021-11-27 LAB — APTT: aPTT: 88 seconds — ABNORMAL HIGH (ref 24–36)

## 2021-11-27 LAB — GLUCOSE, CAPILLARY
Glucose-Capillary: 120 mg/dL — ABNORMAL HIGH (ref 70–99)
Glucose-Capillary: 121 mg/dL — ABNORMAL HIGH (ref 70–99)
Glucose-Capillary: 126 mg/dL — ABNORMAL HIGH (ref 70–99)
Glucose-Capillary: 128 mg/dL — ABNORMAL HIGH (ref 70–99)
Glucose-Capillary: 128 mg/dL — ABNORMAL HIGH (ref 70–99)
Glucose-Capillary: 131 mg/dL — ABNORMAL HIGH (ref 70–99)
Glucose-Capillary: 135 mg/dL — ABNORMAL HIGH (ref 70–99)

## 2021-11-27 LAB — HEPARIN LEVEL (UNFRACTIONATED): Heparin Unfractionated: 0.67 IU/mL (ref 0.30–0.70)

## 2021-11-27 LAB — TRIGLYCERIDES: Triglycerides: 47 mg/dL (ref <150)

## 2021-11-27 MED ORDER — POTASSIUM CHLORIDE 20 MEQ PO PACK
40.0000 meq | PACK | Freq: Once | ORAL | Status: AC
  Administered 2021-11-27: 40 meq
  Filled 2021-11-27: qty 2

## 2021-11-27 NOTE — Progress Notes (Signed)
? ?NAMEDenise Brewer, MRN:  [id number], DOB:  07-02-1966, LOS: 6 ?ADMISSION DATE:  11/21/2021, CONSULTATION DATE: 11/23/2021 ?REFERRING MD:  Benson Norway, CHIEF COMPLAINT: Obtundation ? ?History of Present Illness:  ?56 year old woman who was admitted with increasing shortness of breath.  Now in hypercarbic respiratory failure with increasing obtundation. ? ?Unable to provide history at this time.  Spoke with niece who reports increasing shortness of breath over the last few days.  No other associated symptoms.  Prior admissions for similar but has never been intubated. ? ?Initially no hypercarbia.  Now PCO2 in the 70s.  Was initially responding to questions this morning but is no longer doing so. ?She has not been using CPAP for several years now has her machine has broken down and she is awaiting a repeat sleep study. ? ?Pertinent  Medical History  ? ?Past Medical History:  ?Diagnosis Date  ? DVT (deep vein thrombosis) in pregnancy   ? RLE DVT 02/2016  ? Dyspnea   ? Morbid obesity ([place])   ? PE (pulmonary embolism) 02/29/2016  ? Sleep apnea   ? uses a cpap  ? ? ?Significant Hospital Events: ?Including procedures, antibiotic start and stop dates in addition to other pertinent events   ?5/11 initial admission to hospitalist service.  Started on BiPAP and diuresed successfully for 5 L. ?5/13 increasing somnolence and hypercarbia new since admission. ?5/15 am intubated. Discussed trach with son who wanted to give her a chance of extubation before proceeding straight to trach ?5/16 failed SBT for tachypnea ? ?Interim History / Subjective:  ?Failed SBT this morning for tachypnia and shallowing breathing despite being awake and alert with sedation held. She was on a PS trial of 10/5.  ? ?Objective   ?Blood pressure 121/78, pulse (!) 53, temperature 99.1 ?F (37.3 ?C), temperature source Oral, resp. rate (!) 25, height _0  (1.727 m), weight (!) 186.9 kg, last menstrual period 11/29/2015, SpO2 91 %. ?CVP:  [17 mmHg-18 mmHg]  17 mmHg  ?Vent Mode: PRVC ?FiO2 (%):  [40 %] 40 % ?Set Rate:  [12 bmp] 12 bmp ?Vt Set:  [380 mL] 380 mL ?PEEP:  [5 cmH20-8 cmH20] 8 cmH20 ?Pressure Support:  [8 cmH20] 8 cmH20 ?Plateau Pressure:  [15 cmH20-31 cmH20] 29 cmH20  ? ?Intake/Output Summary (Last 24 hours) at 11/27/2021 1046 ?Last data filed at 11/27/2021 1000 ?Gross per 24 hour  ?Intake 3063.51 ml  ?Output 1595 ml  ?Net 1468.51 ml  ? ?Filed Weights  ? 11/24/21 0500 11/25/21 0434 11/26/21 0456  ?Weight: (!) 190 kg (!) 175.5 kg (!) 186.9 kg  ? ? ?Examination: ?Gen:      Intubated, sedated, acutely ill appearing, obese ?HEENT:  ETT to vent ?Lungs:    sounds of mechanical ventilation auscultated  ?CV:         diminished, RRR ?Abd:      + bowel sounds; soft, non-tender; no palpable masses, no distension ?Ext:    No edema ?Skin:      Warm and dry; no rashes ?Neuro:   sedated, RASS 0 to -1, moves all 4 extremities ? ? ? ?Labs reviewed: ?Slightly positive fluid balance last 24 hours.  ?K 3.6 ?CO2 34 ? ? ?Assessment & Plan:  ? ?Acute respiratory failure with hypoxia and hypercapnia (HCC) ?Untreated OHS/OSA ?-LTVV, VAP bundle, PAD protocol ?-will transition from propofol to precedex with fentanyl gtt ?- maintain minimum PEEP of 8 ?- she has failed PS trials 10/8 for two days in a row. I am concerned  she will be a prolonged vent wean and will not be eligible for trial of extubation. Son was updated on this. If she fails again tomorrow I would reapproach with trach.  ? ?History of pulmonary embolus (PE) ?- heparin gtt for now with plan for trach in next day or 2 ? ?Acute on chronic heart failure with preserved ejection fraction (HFpEF) ([place]) ?-hold home amlodipine ?-Diurese for net negative fluid balance ?- continue lasix 60 mg daily IV ? ?Hyperlipidemia ?-home atorvastatin ? ?Hypokalemia ?- replace and repeat BMET ?Best Practice (right click and "Reselect all SmartList Selections" daily)  ? ?Diet/type: NPO, tube feeds ?DVT prophylaxis: systemic heparin ?GI  prophylaxis: N/A ?Lines: N/A - place PICC today ?Foley:  N/A ?Code Status:  full code ?Last date of multidisciplinary goals of care discussion [Updated's son and his wife this morning and discussed likelihood for tracheostomy with resultant vent SNF as disposition. They are agreeable to trach but want to give her a trial of extubation first.] ? ?CRITICAL CARE ?The patient is critically ill due to respiratory failure.  Critical care was necessary to treat or prevent imminent or life-threatening deterioration.  Critical care was time spent personally by me on the following activities: development of treatment plan with patient and/or surrogate as well as nursing, discussions with consultants, evaluation of patient's response to treatment, examination of patient, obtaining history from patient or surrogate, ordering and performing treatments and interventions, ordering and review of laboratory studies, ordering and review of radiographic studies, pulse oximetry, re-evaluation of patient's condition and participation in multidisciplinary rounds.  ? ?Critical Care Time devoted to patient care services described in this note is 33 minutes. This time reflects time of care of this [place] . This critical care time does not reflect separately billable procedures or procedure time, teaching time or supervisory time of PA/NP/Med student/Med Resident etc but could involve care discussion time.   ?   ? ?Spero Geralds ?[facility] ?11/27/2021 10:46 AM  ?Pager: see AMION ? ?If no response to pager , please call critical care on call (see AMION) until 7pm ?After 7:00 pm call Elink   ? ?

## 2021-11-27 NOTE — Progress Notes (Signed)
eLink Physician-Brief Progress Note ?Patient Name: Keysi Labrada ?DOB: [birth year] ?MRN: [id number] ? ? ?Date of Service ? 11/27/2021  ?HPI/Events of Note ? Agitation - Nursing request to renew bilateral wrist restraints.   ?eICU Interventions ? Plan: ?Will renew bilateral soft wrist restraints X 11 hours.   ? ? ? ?Intervention Category ?Major Interventions: Delirium, psychosis, severe agitation - evaluation and management ? ?Braydee Shimkus Cornelia Copa ?11/27/2021, 10:47 PM ?

## 2021-11-27 NOTE — Progress Notes (Signed)
Heart Failure Stewardship Pharmacist Progress Note ? ? ?PCP: [name], NP ?PCP-Cardiologist: None  ? ? ?HPI:  ?56 yo female with PMH of OSA on CPAP, diastolic CHF, HLD, and DVT 2/2 pregnancy 2017, and pulmonary HTN. Presented to ED with progressive dyspnea at rest and on exertion, orthopnea with LEE on exam. CXR with moderate interstitial lung markings indicative of moderate/severe CHF and pulmonary edema. Echo 05/12 with LVEF 60-65%, mild LVH, normal RV - unchanged since 04/2021 Echo. Adherent to PTA furosemide. Started on IV diuretics. Became less responsive and developed respiratory acidosis despite being on bipap and was then intubated on 5/13. Ongoing discussions for extubation vs trach - failed SBT the past two days.  Family okay with trach if necessary. ? ?Current HF Medications: ?Diuretic: furosemide 60 mg IV daily ? ?Prior to admission HF Medications: ?Diuretic: furosemide 40 mg daily ? ?Pertinent Lab Values: ?Serum creatinine 0.89 (baseline <1), BUN 27 (up), Potassium 3.6, Sodium 137, BNP 139.2, Magnesium 2.2 ? ?Vital Signs: ?Weight: none recorded - (admission weight: 433.4 lbs)  ?05/07/2021 discharge weight: 401 lb ?Blood pressure: 100-10/70s  ?Heart rate: 40-60s ?I/O: -2.3L yesterday; net -8L ? ?Medication Assistance / Insurance Benefits Check: ?Does the patient have prescription insurance?  Yes ?Type of insurance plan: Managed Medicaid - Entresto/SGLT2i require PA ? ?Outpatient Pharmacy:  ?[facility] ?Is the patient willing to use [place] at discharge? Pending ?Is the patient willing to transition their outpatient pharmacy to utilize a [facility] outpatient pharmacy?   Pending ?  ? ?Assessment: ?1. Acute on chronic diastolic CHF (LVEF 09-38%). NYHA class IV symptoms. ?Continued good response to IV diuresis. ?- Continue furosemide IV 60 mg daily - good uop,  ?- Off amlodipine ?- Further GDMT optimization pending improvement with respiratory status ?   ?Plan: ?1) Medication changes recommended at this time: ?- Continue current regimen ?- Give Potassium 40 mEq x1 ? ?2) Patient assistance: ?- None pending ?- Can initiate when patient becomes more stable ? ?3)  Education  ?- To be completed prior to discharge ? ?Laurey Arrow, PharmD ?PGY1 Pharmacy Resident ?11/27/2021  7:12 AM ?

## 2021-11-27 NOTE — Progress Notes (Addendum)
74 - Turned off sedation to attempt SBT with RT.  ? ?0818 - Sedation resumed at 100%, SBT failed despite pt being more alert and following commands.  ? ?0850 - updated son via phone call about pt failing SBT. ?

## 2021-11-27 NOTE — Progress Notes (Signed)
ANTICOAGULATION CONSULT NOTE ? ?Pharmacy Consult for heparin ?Indication: pulmonary embolus ? ?No Known Allergies ? ?Patient Measurements: ?Height: 5' 8" (172.7 cm) ?Weight: (!) 186.9 kg (412 lb) ?IBW/kg (Calculated) : 63.9 ?Heparin Dosing Weight: 114kg ? ?Vital Signs: ?Temp: 98.3 ?F (36.8 ?C) (05/17 0400) ?Temp Source: Axillary (05/17 0400) ?BP: 123/79 (05/17 0600) ?Pulse Rate: 46 (05/17 0600) ? ?Labs: ?Recent Labs  ?  11/25/21 ?0440 11/25/21 ?0615 11/25/21 ?1835 11/25/21 ?2045 11/26/21 ?0355 11/26/21 ?1302 11/27/21 ?0414  ?HGB 12.8  --   --   --  12.9  --  12.3  ?HCT 39.6  --   --   --  40.5  --  40.4  ?PLT 200  --   --   --  174  --  168  ?APTT  --  >200*  --    < > 66* 83* 88*  ?HEPARINUNFRC >1.10* >1.10*  --   --  0.41  --  0.67  ?CREATININE 1.06*  --  1.02*  --  0.82  --  0.89  ? < > = values in this interval not displayed.  ? ? ? ?Estimated Creatinine Clearance: 126 mL/min (by C-G formula based on SCr of 0.89 mg/dL). ? ? ?Medical History: ?Past Medical History:  ?Diagnosis Date  ? DVT (deep vein thrombosis) in pregnancy   ? RLE DVT 02/2016  ? Dyspnea   ? Morbid obesity ([place])   ? PE (pulmonary embolism) 02/29/2016  ? Sleep apnea   ? uses a cpap  ? ?Medications:  ?Infusions:  ? sodium chloride Stopped (11/25/21 1243)  ? dexmedetomidine (PRECEDEX) IV infusion 0.5 mcg/kg/hr (11/27/21 0600)  ? feeding supplement (VITAL AF 1.2 CAL) 1,000 mL (11/27/21 0206)  ? fentaNYL infusion INTRAVENOUS 100 mcg/hr (11/27/21 0600)  ? heparin 2,250 Units/hr (11/27/21 0918)  ? norepinephrine (LEVOPHED) Adult infusion    ? ? ?Assessment: ?56 yo female on PTA Eliquis for her hx of PE. Transferred to ICU for intubation. Her last dose was 5/13 in AM. Pharmacy to dose heparin.  ? ?Most recent aPTT is therapeutic at 88 and heparin level is therapeutic at 0.67 on heparin 2250 units/hr. Both levels appear to be correlating, so will proceed with monitoring using only the heparin level. No issues noted per RN. ? ?Goal of Therapy:  ?Heparin  level 0.3-0.7 units/ml ?aPTT 66-102 seconds ?Monitor platelets by anticoagulation protocol: Yes ?  ?Plan:  ?Empirically decrease to heparin 2200 units/hr to avoid level becoming supratherapeutic  ?Daily heparin level ?Monitor for s/sx of bleeding ? ?Thank you for including pharmacy in the care of this patient. ? ?Zenaida Deed, PharmD ?PGY1 Acute Care Pharmacy Resident  ?Phone: [phone] ?11/27/2021  6:30 AM ? ?Please check [email or web address] for unit-specific pharmacy phone numbers. ? ?

## 2021-11-28 DIAGNOSIS — I5033 Acute on chronic diastolic (congestive) heart failure: Secondary | ICD-10-CM | POA: Diagnosis not present

## 2021-11-28 DIAGNOSIS — G9341 Metabolic encephalopathy: Secondary | ICD-10-CM | POA: Diagnosis not present

## 2021-11-28 DIAGNOSIS — E662 Morbid (severe) obesity with alveolar hypoventilation: Secondary | ICD-10-CM | POA: Diagnosis not present

## 2021-11-28 DIAGNOSIS — J9601 Acute respiratory failure with hypoxia: Secondary | ICD-10-CM | POA: Diagnosis not present

## 2021-11-28 LAB — CBC
HCT: 41.4 % (ref 36.0–46.0)
Hemoglobin: 12.6 g/dL (ref 12.0–15.0)
MCH: 28.9 pg (ref 26.0–34.0)
MCHC: 30.4 g/dL (ref 30.0–36.0)
MCV: 95 fL (ref 80.0–100.0)
Platelets: 197 10*3/uL (ref 150–400)
RBC: 4.36 MIL/uL (ref 3.87–5.11)
RDW: 14.6 % (ref 11.5–15.5)
WBC: 6.6 10*3/uL (ref 4.0–10.5)
nRBC: 0 % (ref 0.0–0.2)

## 2021-11-28 LAB — BASIC METABOLIC PANEL
Anion gap: 8 (ref 5–15)
BUN: 34 mg/dL — ABNORMAL HIGH (ref 6–20)
CO2: 35 mmol/L — ABNORMAL HIGH (ref 22–32)
Calcium: 9 mg/dL (ref 8.9–10.3)
Chloride: 95 mmol/L — ABNORMAL LOW (ref 98–111)
Creatinine, Ser: 0.94 mg/dL (ref 0.44–1.00)
GFR, Estimated: >60 mL/min (ref >60)
Glucose, Bld: 133 mg/dL — ABNORMAL HIGH (ref 70–99)
Potassium: 4.1 mmol/L (ref 3.5–5.1)
Sodium: 138 mmol/L (ref 135–145)

## 2021-11-28 LAB — HEPARIN LEVEL (UNFRACTIONATED): Heparin Unfractionated: 0.65 IU/mL (ref 0.30–0.70)

## 2021-11-28 LAB — GLUCOSE, CAPILLARY
Glucose-Capillary: 124 mg/dL — ABNORMAL HIGH (ref 70–99)
Glucose-Capillary: 108 mg/dL — ABNORMAL HIGH (ref 70–99)
Glucose-Capillary: 128 mg/dL — ABNORMAL HIGH (ref 70–99)
Glucose-Capillary: 135 mg/dL — ABNORMAL HIGH (ref 70–99)
Glucose-Capillary: 109 mg/dL — ABNORMAL HIGH (ref 70–99)

## 2021-11-28 LAB — MAGNESIUM: Magnesium: 2.3 mg/dL (ref 1.7–2.4)

## 2021-11-28 MED ORDER — ALTEPLASE 2 MG IJ SOLR
2.0000 mg | Freq: Once | INTRAMUSCULAR | Status: AC
  Administered 2021-11-28: 2 mg
  Filled 2021-11-28: qty 2

## 2021-11-28 MED ORDER — "THROMBI-PAD 3""X3"" EX PADS"
1.0000 | MEDICATED_PAD | Freq: Once | CUTANEOUS | Status: AC
  Administered 2021-11-29: 1 via TOPICAL
  Filled 2021-11-28: qty 1

## 2021-11-28 MED ORDER — INSULIN ASPART 100 UNIT/ML IJ SOLN
2.0000 [IU] | INTRAMUSCULAR | Status: DC
  Administered 2021-11-28 – 2021-11-30 (×4): 2 [IU] via SUBCUTANEOUS
  Administered 2021-11-30: 4 [IU] via SUBCUTANEOUS
  Administered 2021-11-30 – 2021-12-01 (×4): 2 [IU] via SUBCUTANEOUS

## 2021-11-28 NOTE — Progress Notes (Signed)
Angel Brewer, MRN:  [id number], DOB:  [birth year], LOS: 7 ADMISSION DATE:  11/21/2021, CONSULTATION DATE: 11/23/2021 REFERRING MD:  Benson Norway, CHIEF COMPLAINT: Obtundation  History of Present Illness:  56 year old woman who was admitted with increasing shortness of breath.  Now in hypercarbic respiratory failure with increasing obtundation.  Unable to provide history at this time.  Spoke with niece who reports increasing shortness of breath over the last few days.  No other associated symptoms.  Prior admissions for similar but has never been intubated.  Initially no hypercarbia.  Now PCO2 in the 70s.  Was initially responding to questions this morning but is no longer doing so. She has not been using CPAP for several years now has her machine has broken down and she is awaiting a repeat sleep study.  Pertinent  Medical History   Past Medical History:  Diagnosis Date   DVT (deep vein thrombosis) in pregnancy    RLE DVT 02/2016   Dyspnea    Morbid obesity (HCC)    PE (pulmonary embolism) 02/29/2016   Sleep apnea    uses a cpap    Significant Hospital Events: Including procedures, antibiotic start and stop dates in addition to other pertinent events   5/11 initial admission to hospitalist service.  Started on BiPAP and diuresed successfully for 5 L. 5/13 increasing somnolence and hypercarbia new since admission. 5/15 am intubated. Discussed trach with son who wanted to give her a chance of extubation before proceeding straight to trach 5/16 failed SBT for tachypnea  Interim History / Subjective:  Failed SBT again this morning. I spoke with her about tracheostomy and she expresses that she is currently miserable and not comfortable breathing through tube in her mouth. Needing sedation and mits for restraint.  Objective   Blood pressure 111/64, pulse (!) 48, temperature 98.8 F (37.1 C), temperature source Oral, resp. rate 17, height _0  (1.727 m), weight (!) 190.5 kg,  last menstrual period 11/29/2015, SpO2 95 %. CVP:  [1 mmHg-13 mmHg] 6 mmHg  Vent Mode: PRVC FiO2 (%):  [40 %-50 %] 50 % Set Rate:  [12 bmp] 12 bmp Vt Set:  [380 mL-580 mL] 380 mL PEEP:  [8 cmH20] 8 cmH20 Plateau Pressure:  [18 cmH20-30 cmH20] 30 cmH20   Intake/Output Summary (Last 24 hours) at 11/28/2021 0941 Last data filed at 11/28/2021 3614 Gross per 24 hour  Intake 2581.78 ml  Output 2175 ml  Net 406.78 ml   Filed Weights   11/25/21 0434 11/26/21 0456 11/28/21 0220  Weight: (!) 175.5 kg (!) 186.9 kg (!) 190.5 kg    Examination: Intubated, awake, alert Obese, short neck Able to follow commands and mouth words Moves all 4 extremities    Assessment & Plan:   Acute respiratory failure with hypoxia and hypercapnia (HCC) Untreated OHS/OSA -LTVV, VAP bundle, PAD protocol - continue precedex and fentanyl - maintain minimum PEEP of 8 - she has failed PS trials 10/8 for three days in a row. Discussed trach with patient and son and they are in agreement to proceed. Son consented for trach and bronch. Antcipated date 5/19.   History of pulmonary embolus (PE) - heparin gtt for now   Acute on chronic heart failure with preserved ejection fraction (HFpEF) ([place]) -hold home amlodipine -Diurese for net negative fluid balance - continue lasix 60 mg daily IV  Hyperlipidemia -home atorvastatin  Hypokalemia - replace and repeat BMET Best Practice (right click and "Reselect all SmartList Selections" daily)   Diet/type: NPO,  tube feeds DVT prophylaxis: systemic heparin GI prophylaxis: N/A Lines: N/A - place PICC today Foley:  N/A Code Status:  full code Last date of multidisciplinary goals of care discussion [Updated's son 5/18 over the phone and consented for tracheostomy and bronchoscopy assistance.]  The patient is critically ill due to respiratory failure.  Critical care was necessary to treat or prevent imminent or life-threatening deterioration.  Critical care was time  spent personally by me on the following activities: development of treatment plan with patient and/or surrogate as well as nursing, discussions with consultants, evaluation of patient's response to treatment, examination of patient, obtaining history from patient or surrogate, ordering and performing treatments and interventions, ordering and review of laboratory studies, ordering and review of radiographic studies, pulse oximetry, re-evaluation of patient's condition and participation in multidisciplinary rounds.   Critical Care Time devoted to patient care services described in this note is 45 minutes. This time reflects time of care of this [place] . This critical care time does not reflect separately billable procedures or procedure time, teaching time or supervisory time of PA/NP/Med student/Med Resident etc but could involve care discussion time.       Spero Geralds [place] [facility] 11/28/2021 9:42 AM  Pager: see AMION  If no response to pager , please call critical care on call (see AMION) until 7pm After 7:00 pm call Elink

## 2021-11-28 NOTE — Progress Notes (Signed)
[place] for heparin Indication: pulmonary embolus  No Known Allergies  Patient Measurements: Height: _0  (172.7 cm) Weight: (!) 190.5 kg (420 lb) IBW/kg (Calculated) : 63.9 Heparin Dosing Weight: 114kg  Vital Signs: Temp: 98.2 F (36.8 C) (05/18 0333) Temp Source: Axillary (05/18 0333) BP: 101/78 (05/18 0600) Pulse Rate: 47 (05/18 0600)  Labs: Recent Labs    11/26/21 0355 11/26/21 1302 11/27/21 0414 11/28/21 0350  HGB 12.9  --  12.3 12.6  HCT 40.5  --  40.4 41.4  PLT 174  --  168 197  APTT 66* 83* 88*  --   HEPARINUNFRC 0.41  --  0.67 0.65  CREATININE 0.82  --  0.89 0.94     Estimated Creatinine Clearance: 120.8 mL/min (by C-G formula based on SCr of 0.94 mg/dL).   Medical History: Past Medical History:  Diagnosis Date   DVT (deep vein thrombosis) in pregnancy    RLE DVT 02/2016   Dyspnea    Morbid obesity (HCC)    PE (pulmonary embolism) 02/29/2016   Sleep apnea    uses a cpap   Medications:  Infusions:   sodium chloride Stopped (11/25/21 1243)   dexmedetomidine (PRECEDEX) IV infusion Stopped (11/28/21 0402)   feeding supplement (VITAL AF 1.2 CAL) 1,000 mL (11/27/21 0206)   fentaNYL infusion INTRAVENOUS 100 mcg/hr (11/28/21 0600)   heparin 2,200 Units/hr (11/28/21 0617)   norepinephrine (LEVOPHED) Adult infusion      Assessment: 56 yo female on PTA Eliquis for her hx of PE. Transferred to ICU for intubation. Her last dose was 5/13 in AM. Pharmacy to dose heparin.   Most heparin level is therapeutic at 0.65 on heparin 2200 units/hr. No issues noted per RN. CBC continues to be within normal limits and stable.   Goal of Therapy:  Heparin level 0.3-0.7 units/ml Monitor platelets by anticoagulation protocol: Yes   Plan:  Continue heparin 2200 units/hr Daily heparin level and CBC Monitor for s/sx of bleeding  Thank you for including pharmacy in the care of this patient.  Zenaida Deed, PharmD PGY1 Acute  Care Pharmacy Resident  Phone: [phone] 11/28/2021  6:32 AM  Please check [email or web address] for unit-specific pharmacy phone numbers.

## 2021-11-28 NOTE — Progress Notes (Signed)
Heart Failure Stewardship Pharmacist Progress Note   PCP: [name], NP PCP-Cardiologist: None    HPI:  56 yo female with PMH of OSA on CPAP, diastolic CHF, HLD, and DVT 2/2 pregnancy 2017, and pulmonary HTN. Presented to ED with progressive dyspnea at rest and on exertion, orthopnea with LEE on exam. CXR with moderate interstitial lung markings indicative of moderate/severe CHF and pulmonary edema. Echo 05/12 with LVEF 60-65%, mild LVH, normal RV - unchanged since 04/2021 Echo. Adherent to PTA furosemide. Started on IV diuretics. Became less responsive and developed respiratory acidosis despite being on bipap and was then intubated on 5/13. Ongoing discussions for extubation vs trach - failed SBT the past three days.  Family okay with trach, planning for 05/19.  Current HF Medications: Diuretic: furosemide 60 mg IV daily  Prior to admission HF Medications: Diuretic: furosemide 40 mg daily  Pertinent Lab Values: Serum creatinine 0.94 (baseline <1), BUN 34 (up), Potassium 4.1, Sodium 138, BNP 139.2, Magnesium 2.3  Vital Signs: Weight: 420 lbs - (admission weight: 433.4 lbs)  05/07/2021 discharge weight: 401 lb Blood pressure: 100-30/70s  Heart rate: 40-50s I/O: -2.1L yesterday; net -7.54L  Medication Assistance / Insurance Benefits Check: Does the patient have prescription insurance?  Yes Type of insurance plan: Managed Medicaid - Entresto/SGLT2i require [place]  Outpatient Pharmacy:  [facility] Is the patient willing to use [facility] at discharge? Pending Is the patient willing to transition their outpatient pharmacy to utilize a [facility] outpatient pharmacy?   Pending    Assessment: 1. Acute on chronic diastolic CHF (LVEF 60-63%). NYHA class IV symptoms. Continued good response to IV diuresis. - Continue furosemide IV 60 mg daily - Off amlodipine - Further GDMT optimization pending improvement with respiratory status   Plan: 1)  Medication changes recommended at this time: - Continue current regimen - Give Potassium 40 mEq x1  2) Patient assistance: - None pending - Can initiate when patient becomes more stable  3)  Education  - To be completed prior to discharge  Laurey Arrow, PharmD PGY1 Pharmacy Resident 11/28/2021  8:51 AM

## 2021-11-28 NOTE — Progress Notes (Signed)
eLink Physician-Brief Progress Note Patient Name: Angel Brewer DOB: 1965-12-02 MRN: [id number]   Date of Service  11/28/2021  HPI/Events of Note  Patient oozing from PICC line site.   eICU Interventions  Plan: Thrombi-pad to oozing PICC line site.      Intervention Category Major Interventions: Other:  [name] 11/28/2021, 9:07 PM

## 2021-11-29 ENCOUNTER — Inpatient Hospital Stay (HOSPITAL_COMMUNITY): Payer: Medicaid Other

## 2021-11-29 DIAGNOSIS — I5033 Acute on chronic diastolic (congestive) heart failure: Secondary | ICD-10-CM | POA: Diagnosis not present

## 2021-11-29 DIAGNOSIS — J9601 Acute respiratory failure with hypoxia: Secondary | ICD-10-CM | POA: Diagnosis not present

## 2021-11-29 DIAGNOSIS — E662 Morbid (severe) obesity with alveolar hypoventilation: Secondary | ICD-10-CM | POA: Diagnosis not present

## 2021-11-29 DIAGNOSIS — R918 Other nonspecific abnormal finding of lung field: Secondary | ICD-10-CM | POA: Diagnosis not present

## 2021-11-29 DIAGNOSIS — G9341 Metabolic encephalopathy: Secondary | ICD-10-CM | POA: Diagnosis not present

## 2021-11-29 DIAGNOSIS — J984 Other disorders of lung: Secondary | ICD-10-CM | POA: Diagnosis not present

## 2021-11-29 LAB — GLUCOSE, CAPILLARY
Glucose-Capillary: 102 mg/dL — ABNORMAL HIGH (ref 70–99)
Glucose-Capillary: 103 mg/dL — ABNORMAL HIGH (ref 70–99)
Glucose-Capillary: 105 mg/dL — ABNORMAL HIGH (ref 70–99)
Glucose-Capillary: 114 mg/dL — ABNORMAL HIGH (ref 70–99)
Glucose-Capillary: 117 mg/dL — ABNORMAL HIGH (ref 70–99)
Glucose-Capillary: 133 mg/dL — ABNORMAL HIGH (ref 70–99)
Glucose-Capillary: 99 mg/dL (ref 70–99)

## 2021-11-29 LAB — BASIC METABOLIC PANEL
Anion gap: 7 (ref 5–15)
BUN: 40 mg/dL — ABNORMAL HIGH (ref 6–20)
CO2: 37 mmol/L — ABNORMAL HIGH (ref 22–32)
Calcium: 9.2 mg/dL (ref 8.9–10.3)
Chloride: 94 mmol/L — ABNORMAL LOW (ref 98–111)
Creatinine, Ser: 0.95 mg/dL (ref 0.44–1.00)
GFR, Estimated: >60 mL/min (ref >60)
Glucose, Bld: 127 mg/dL — ABNORMAL HIGH (ref 70–99)
Potassium: 4.2 mmol/L (ref 3.5–5.1)
Sodium: 138 mmol/L (ref 135–145)

## 2021-11-29 LAB — HEPARIN LEVEL (UNFRACTIONATED): Heparin Unfractionated: 0.55 IU/mL (ref 0.30–0.70)

## 2021-11-29 LAB — CBC
HCT: 39.8 % (ref 36.0–46.0)
Hemoglobin: 12.1 g/dL (ref 12.0–15.0)
MCH: 29.3 pg (ref 26.0–34.0)
MCHC: 30.4 g/dL (ref 30.0–36.0)
MCV: 96.4 fL (ref 80.0–100.0)
Platelets: 198 10*3/uL (ref 150–400)
RBC: 4.13 MIL/uL (ref 3.87–5.11)
RDW: 14.6 % (ref 11.5–15.5)
WBC: 8.8 10*3/uL (ref 4.0–10.5)
nRBC: 0 % (ref 0.0–0.2)

## 2021-11-29 MED ORDER — ROCURONIUM BROMIDE 10 MG/ML (PF) SYRINGE
PREFILLED_SYRINGE | INTRAVENOUS | Status: AC
  Administered 2021-11-29: 100 mg via INTRAVENOUS
  Filled 2021-11-29: qty 10

## 2021-11-29 MED ORDER — ETOMIDATE 2 MG/ML IV SOLN
10.0000 mg | Freq: Once | INTRAVENOUS | Status: AC
  Administered 2021-11-29: 10 mg via INTRAVENOUS

## 2021-11-29 MED ORDER — ETOMIDATE 2 MG/ML IV SOLN
20.0000 mg | Freq: Once | INTRAVENOUS | Status: AC
  Filled 2021-11-29: qty 10

## 2021-11-29 MED ORDER — ROCURONIUM BROMIDE 50 MG/5ML IV SOLN
100.0000 mg | Freq: Once | INTRAVENOUS | Status: AC
  Filled 2021-11-29: qty 10

## 2021-11-29 MED ORDER — VECURONIUM BROMIDE 10 MG IV SOLR
10.0000 mg | Freq: Once | INTRAVENOUS | Status: AC
  Administered 2021-11-29: 10 mg via INTRAVENOUS

## 2021-11-29 MED ORDER — ETOMIDATE 2 MG/ML IV SOLN
INTRAVENOUS | Status: AC
Start: 2021-11-29 — End: 2021-11-29
  Administered 2021-11-29: 20 mg via INTRAVENOUS
  Filled 2021-11-29: qty 10

## 2021-11-29 MED ORDER — HEPARIN (PORCINE) 25000 UT/250ML-% IV SOLN
2200.0000 [IU]/h | INTRAVENOUS | Status: DC
  Administered 2021-11-29 – 2021-12-02 (×7): 2200 [IU]/h via INTRAVENOUS
  Filled 2021-11-29 (×7): qty 250

## 2021-11-29 MED ORDER — PROPOFOL 1000 MG/100ML IV EMUL
INTRAVENOUS | Status: AC
  Administered 2021-11-29: 30 mg via INTRAVENOUS
  Filled 2021-11-29: qty 100

## 2021-11-29 MED ORDER — PROPOFOL 1000 MG/100ML IV EMUL
5.0000 ug/kg/min | INTRAVENOUS | Status: AC
  Administered 2021-11-29: 20 ug/kg/min via INTRAVENOUS
  Administered 2021-11-29: 30 ug/kg/min via INTRAVENOUS
  Filled 2021-11-29: qty 100

## 2021-11-29 MED ORDER — MIDAZOLAM HCL 2 MG/2ML IJ SOLN
4.0000 mg | Freq: Once | INTRAMUSCULAR | Status: AC
  Administered 2021-11-29: 4 mg via INTRAVENOUS
  Filled 2021-11-29: qty 4

## 2021-11-29 MED ORDER — PROPOFOL BOLUS VIA INFUSION
30.0000 mg | Freq: Once | INTRAVENOUS | Status: AC
  Filled 2021-11-29: qty 30

## 2021-11-29 MED ORDER — VECURONIUM BROMIDE 10 MG IV SOLR
INTRAVENOUS | Status: AC
  Administered 2021-11-29: 10 mg via INTRAVENOUS
  Filled 2021-11-29: qty 10

## 2021-11-29 MED ORDER — VECURONIUM BROMIDE 10 MG IV SOLR
10.0000 mg | Freq: Once | INTRAVENOUS | Status: AC
  Filled 2021-11-29: qty 10

## 2021-11-29 NOTE — Progress Notes (Addendum)
[place] for heparin Indication: pulmonary embolus  No Known Allergies  Patient Measurements: Height: _0  (172.7 cm) Weight: (!) 191 kg (421 lb 1.3 oz) IBW/kg (Calculated) : 63.9 Heparin Dosing Weight: 114kg  Vital Signs: Temp: 98.7 F (37.1 C) (05/19 0432) Temp Source: Axillary (05/19 0432) BP: 113/71 (05/19 0600) Pulse Rate: 43 (05/19 0600)  Labs: Recent Labs    11/26/21 1302 11/27/21 0414 11/27/21 0414 11/28/21 0350 11/29/21 0438  HGB  --  12.3   < > 12.6 12.1  HCT  --  40.4  --  41.4 39.8  PLT  --  168  --  197 198  APTT 83* 88*  --   --   --   HEPARINUNFRC  --  0.67  --  0.65 0.55  CREATININE  --  0.89  --  0.94 0.95   < > = values in this interval not displayed.     Estimated Creatinine Clearance: 119.7 mL/min (by C-G formula based on SCr of 0.95 mg/dL).   Medical History: Past Medical History:  Diagnosis Date   DVT (deep vein thrombosis) in pregnancy    RLE DVT 02/2016   Dyspnea    Morbid obesity (HCC)    PE (pulmonary embolism) 02/29/2016   Sleep apnea    uses a cpap   Medications:  Infusions:   sodium chloride Stopped (11/25/21 1243)   dexmedetomidine (PRECEDEX) IV infusion 0.1 mcg/kg/hr (11/29/21 0600)   feeding supplement (VITAL AF 1.2 CAL) Stopped (11/29/21 0420)   fentaNYL infusion INTRAVENOUS 100 mcg/hr (11/29/21 0600)   heparin 2,200 Units/hr (11/29/21 0600)   norepinephrine (LEVOPHED) Adult infusion      Assessment: 56 yo female on PTA Eliquis for her hx of PE. Transferred to ICU for intubation. Her last dose was 5/13 in AM. Pharmacy to dose heparin.   [name], [name]  Phone: [phone] 11/29/2021  6:52 AM  Please check [email or web address] for unit-specific pharmacy phone numbers. ________________________________ Addendum 11/29/2021 at 1517:  Tracheostomy done at 1500 Per MD, restart heparin in 4 hours   Plan: Restart heparin 2200 units/hr at 1900 8 hour heparin level Daily heparin level, CBC Monitor for s/sx of bleeding  Zenaida Deed, PharmD 11/29/2021  3:18 PM

## 2021-11-29 NOTE — Procedures (Signed)
Percutaneous Tracheostomy Procedure Note   Angel Brewer  [id number]  April 23, 1966  Date:11/29/21  Time:3:03 PM   Provider Performing:Jacquilyn Seldon  Procedure: Percutaneous Tracheostomy with Bronchoscopic Guidance (31600)  Indication(s) Acute respiratory failure  Consent Risks of the procedure as well as the alternatives and risks of each were explained to the patient and/or caregiver.  Consent for the procedure was obtained.  Anesthesia Etomidate, Versed, Fentanyl, Vecuronium   Time Out Verified patient identification, verified procedure, site/side was marked, verified correct patient position, special equipment/implants available, medications/allergies/relevant history reviewed, required imaging and test results available.   Sterile Technique Maximal sterile technique including sterile barrier drape, hand hygiene, sterile gown, sterile gloves, mask, hair covering.    Procedure Description Appropriate anatomy identified by palpation.  Patient's neck prepped and draped in sterile fashion.  1% lidocaine with epinephrine was used to anesthetize skin overlying neck.  1.5cm incision made and blunt dissection performed until tracheal rings could be easily palpated.   Then a size 6 Shiley tracheostomy was placed under bronchoscopic visualization using usual Seldinger technique and serial dilation.   Bronchoscope confirmed placement above the carina.  Tracheostomy was sutured in place with adhesive pad to protect skin under pressure.    Patient connected to ventilator.   Complications/Tolerance None; patient tolerated the procedure well. Chest X-ray is ordered to confirm no post-procedural complication.   EBL Minimal   Specimen(s) None

## 2021-11-29 NOTE — Progress Notes (Signed)
eLink Physician-Brief Progress Note Patient Name: Angel Brewer DOB: [birth year] MRN: [id number]   Date of Service  11/29/2021  HPI/Events of Note  PCCM progress notes state that Tracheostomy is planned for today, however, time of procedure is not specified. Patient is on Heparin IV infusion for PE.   eICU Interventions  Plan: Will hold Heparin at 6 AM. PCCM rounding team to restart Heparin IV infusion post procedure.      Intervention Category Major Interventions: Other:  [name] 11/29/2021, 5:10 AM

## 2021-11-29 NOTE — Progress Notes (Signed)
SLP Cancellation Note  Patient Details Name: Angel Brewer MRN: [id number] DOB: [birth year]   Cancelled treatment:       Reason Eval/Treat Not Completed: Patient not medically ready Patient with new tracheostomy 5/19 and remains on the vent at this time. Orders for SLP eval and treat for PMSV and swallowing received. Will follow pt closely for readiness for SLP interventions as appropriate.   Avin Upperman I. Hardin Negus, [place], [place] Office number [phone] Pager [place] 11/29/2021, 3:28 PM

## 2021-11-29 NOTE — Procedures (Signed)
Diagnostic Bronchoscopy  Angel Brewer  [id number]  [birth year]  Date:11/29/21  Time:3:05 PM   Provider Performing:Jeniffer Culliver Mauricio Po   Procedure: Diagnostic Bronchoscopy (40981)  Indication(s) Assist with direct visualization of tracheostomy placement  Consent Risks of the procedure as well as the alternatives and risks of each were explained to the patient and/or caregiver.  Consent for the procedure was obtained.   Anesthesia See separate tracheostomy note   Time Out Verified patient identification, verified procedure, site/side was marked, verified correct patient position, special equipment/implants available, medications/allergies/relevant history reviewed, required imaging and test results available.   Sterile Technique Usual hand hygiene, masks, gowns, and gloves were used   Procedure Description Bronchoscope advanced through endotracheal tube and into airway.  After suctioning out tracheal secretions, bronchoscope used to provide direct visualization of tracheostomy placement.   Complications/Tolerance Patient required significant amounts of sedation. She was given 200 mcg fentanyl 4 mg versed, 30 mg etomidate, vecuronium 20, rocuronium 100 with propofol bolus and infusion. She did finally achieve ventilator synchrony and stable hemodynamics prior to proceeding with procedure. See Dr. Jeanella Craze note regarding tracheostomy.    EBL Minimal.   Specimen(s) None

## 2021-11-29 NOTE — Progress Notes (Signed)
Angel Brewer, MRN:  [id number], DOB:  [birth year], LOS: 8 ADMISSION DATE:  11/21/2021, CONSULTATION DATE: 11/23/2021 REFERRING MD:  Benson Norway, CHIEF COMPLAINT: Obtundation  History of Present Illness:  56 year old woman who was admitted with increasing shortness of breath.  Now in hypercarbic respiratory failure with increasing obtundation.  Unable to provide history at this time.  Spoke with niece who reports increasing shortness of breath over the last few days.  No other associated symptoms.  Prior admissions for similar but has never been intubated.  Initially no hypercarbia.  Now PCO2 in the 70s.  Was initially responding to questions this morning but is no longer doing so. She has not been using CPAP for several years now has her machine has broken down and she is awaiting a repeat sleep study.  Pertinent  Medical History   Past Medical History:  Diagnosis Date   DVT (deep vein thrombosis) in pregnancy    RLE DVT 02/2016   Dyspnea    Morbid obesity (HCC)    PE (pulmonary embolism) 02/29/2016   Sleep apnea    uses a cpap    Significant Hospital Events: Including procedures, antibiotic start and stop dates in addition to other pertinent events   5/11 initial admission to hospitalist service.  Started on BiPAP and diuresed successfully for 5 L. 5/13 increasing somnolence and hypercarbia new since admission. 5/15 am intubated. Discussed trach with son who wanted to give her a chance of extubation before proceeding straight to trach 5/16 failed SBT for tachypnea 5/17 failed SBT for tachypnea 5/18 failed SBT for rapid and shallow breathing despite high PS  Interim History / Subjective:  Bradycardic overnight so precedex was held. she is awake and alert. Son at bedside.   Objective   Blood pressure 103/79, pulse (!) 57, temperature 98.7 F (37.1 C), temperature source Axillary, resp. rate 20, height _0  (1.727 m), weight (!) 191 kg, last menstrual period 11/29/2015,  SpO2 98 %. CVP:  [9 mmHg-23 mmHg] 12 mmHg  Vent Mode: PRVC FiO2 (%):  [50 %] 50 % Set Rate:  [12 bmp] 12 bmp Vt Set:  [380 mL] 380 mL PEEP:  [8 cmH20] 8 cmH20 Plateau Pressure:  [28 cmH20-32 cmH20] 28 cmH20   Intake/Output Summary (Last 24 hours) at 11/29/2021 1032 Last data filed at 11/29/2021 1031 Gross per 24 hour  Intake 2429.52 ml  Output 1965 ml  Net 464.52 ml   Filed Weights   11/26/21 0456 11/28/21 0220 11/29/21 0439  Weight: (!) 186.9 kg (!) 190.5 kg (!) 191 kg    Examination: Intubated, awake, alert Obese, short neck Able to follow commands and mouth words Moves all 4 extremities    Assessment & Plan:   Acute respiratory failure with hypoxia and hypercapnia (HCC) Untreated OHS/OSA -LTVV, VAP bundle, PAD protocol - continue precedex and fentanyl - maintain minimum PEEP of 8 - she has failed PS trials 10/8 for three days in a row. Discussed trach with patient and son and they are in agreement to proceed. Son consented for trach and bronch. Antcipated this afternoon. Heparin held.   History of pulmonary embolus (PE) - heparin gtt for now   Acute on chronic heart failure with preserved ejection fraction (HFpEF) ([place]) -hold home amlodipine -Diurese for net negative fluid balance - continue lasix 60 mg daily IV  Hyperlipidemia -home atorvastatin  Hypokalemia - replace and repeat BMET Best Practice (right click and "Reselect all SmartList Selections" daily)   Diet/type: NPO, tube feeds DVT  prophylaxis: systemic heparin GI prophylaxis: N/A Lines: N/A - place PICC today Foley:  N/A Code Status:  full code Last date of multidisciplinary goals of care discussion [Updated's son 5/18 over the phone and consented for tracheostomy and bronchoscopy assistance.]  The patient is critically ill due to respiratory failure.  Critical care was necessary to treat or prevent imminent or life-threatening deterioration.  Critical care was time spent personally by me on the  following activities: development of treatment plan with patient and/or surrogate as well as nursing, discussions with consultants, evaluation of patient's response to treatment, examination of patient, obtaining history from patient or surrogate, ordering and performing treatments and interventions, ordering and review of laboratory studies, ordering and review of radiographic studies, pulse oximetry, re-evaluation of patient's condition and participation in multidisciplinary rounds.   Critical Care Time devoted to patient care services described in this note is 32 minutes. This time reflects time of care of this [place] . This critical care time does not reflect separately billable procedures or procedure time, teaching time or supervisory time of PA/NP/Med student/Med Resident etc but could involve care discussion time.       Spero Geralds [place] [facility] 11/29/2021 10:35 AM  Pager: see AMION  If no response to pager , please call critical care on call (see AMION) until 7pm After 7:00 pm call Elink

## 2021-11-29 NOTE — Progress Notes (Signed)
Heart Failure Stewardship Pharmacist Progress Note   PCP: [name], NP PCP-Cardiologist: None    HPI:  56 yo female with PMH of OSA on CPAP, diastolic CHF, HLD, and DVT 2/2 pregnancy 2017, and pulmonary HTN. Presented to ED with progressive dyspnea at rest and on exertion, orthopnea with LEE on exam. CXR with moderate interstitial lung markings indicative of moderate/severe CHF and pulmonary edema. Echo 05/12 with LVEF 60-65%, mild LVH, normal RV - unchanged since 04/2021 Echo. Adherent to PTA furosemide. Started on IV diuretics. Became less responsive and developed respiratory acidosis despite being on bipap and was then intubated on 5/13. Ongoing discussions for extubation vs trach - failed SBT 05/16, 05/17, 05/18.  Family okay with trach, planning for 05/19.  Current HF Medications: Diuretic: furosemide 60 mg IV daily  Prior to admission HF Medications: Diuretic: furosemide 40 mg daily  Pertinent Lab Values: Serum creatinine 0.95 (baseline <1), BUN 40 (up), Potassium 4.2, Sodium 138, BNP 139.2, Magnesium 2.3  Vital Signs: Weight: 421 lbs - (admission weight: 433.4 lbs)  05/07/2021 discharge weight: 401 lb Blood pressure: 110s/60s Heart rate: 40s I/O: -1.64L yesterday; net -2.34L  Medication Assistance / Insurance Benefits Check: Does the patient have prescription insurance?  Yes Type of insurance plan: Managed Medicaid - Entresto/SGLT2i require [place]  Outpatient Pharmacy:  [facility] Is the patient willing to use [place] at discharge? Pending Is the patient willing to transition their outpatient pharmacy to utilize a [facility] outpatient pharmacy?   Pending    Assessment: 1. Acute on chronic diastolic CHF (LVEF 61-95%). NYHA class IV symptoms. Continued good response to IV diuresis. - Continue furosemide IV 60 mg daily - Off amlodipine - Further GDMT optimization pending improvement with respiratory status   Plan: 1)  Medication changes recommended at this time: - Continue current regimen  2) Patient assistance: - None pending - Can initiate when patient becomes more stable  3)  Education  - To be completed prior to discharge  Laurey Arrow, PharmD PGY1 Pharmacy Resident 11/29/2021  8:07 AM

## 2021-11-30 ENCOUNTER — Inpatient Hospital Stay (HOSPITAL_COMMUNITY): Payer: Medicaid Other

## 2021-11-30 DIAGNOSIS — I5033 Acute on chronic diastolic (congestive) heart failure: Secondary | ICD-10-CM | POA: Diagnosis not present

## 2021-11-30 DIAGNOSIS — J9601 Acute respiratory failure with hypoxia: Secondary | ICD-10-CM | POA: Diagnosis not present

## 2021-11-30 DIAGNOSIS — G9341 Metabolic encephalopathy: Secondary | ICD-10-CM | POA: Diagnosis not present

## 2021-11-30 DIAGNOSIS — E662 Morbid (severe) obesity with alveolar hypoventilation: Secondary | ICD-10-CM | POA: Diagnosis not present

## 2021-11-30 DIAGNOSIS — J90 Pleural effusion, not elsewhere classified: Secondary | ICD-10-CM | POA: Diagnosis not present

## 2021-11-30 LAB — CBC
HCT: 39.8 % (ref 36.0–46.0)
Hemoglobin: 11.3 g/dL — ABNORMAL LOW (ref 12.0–15.0)
MCH: 28 pg (ref 26.0–34.0)
MCHC: 28.4 g/dL — ABNORMAL LOW (ref 30.0–36.0)
MCV: 98.5 fL (ref 80.0–100.0)
Platelets: 222 10*3/uL (ref 150–400)
RBC: 4.04 MIL/uL (ref 3.87–5.11)
RDW: 14.5 % (ref 11.5–15.5)
WBC: 9.2 10*3/uL (ref 4.0–10.5)
nRBC: 0 % (ref 0.0–0.2)

## 2021-11-30 LAB — COMPREHENSIVE METABOLIC PANEL
ALT: 18 U/L (ref 0–44)
AST: 23 U/L (ref 15–41)
Albumin: 2.3 g/dL — ABNORMAL LOW (ref 3.5–5.0)
Alkaline Phosphatase: 46 U/L (ref 38–126)
Anion gap: 7 (ref 5–15)
BUN: 36 mg/dL — ABNORMAL HIGH (ref 6–20)
CO2: 37 mmol/L — ABNORMAL HIGH (ref 22–32)
Calcium: 9 mg/dL (ref 8.9–10.3)
Chloride: 93 mmol/L — ABNORMAL LOW (ref 98–111)
Creatinine, Ser: 0.97 mg/dL (ref 0.44–1.00)
GFR, Estimated: >60 mL/min (ref >60)
Glucose, Bld: 266 mg/dL — ABNORMAL HIGH (ref 70–99)
Potassium: 4.3 mmol/L (ref 3.5–5.1)
Sodium: 137 mmol/L (ref 135–145)
Total Bilirubin: 1.2 mg/dL (ref 0.3–1.2)
Total Protein: 7 g/dL (ref 6.5–8.1)

## 2021-11-30 LAB — GLUCOSE, CAPILLARY
Glucose-Capillary: 129 mg/dL — ABNORMAL HIGH (ref 70–99)
Glucose-Capillary: 131 mg/dL — ABNORMAL HIGH (ref 70–99)
Glucose-Capillary: 132 mg/dL — ABNORMAL HIGH (ref 70–99)
Glucose-Capillary: 150 mg/dL — ABNORMAL HIGH (ref 70–99)
Glucose-Capillary: 154 mg/dL — ABNORMAL HIGH (ref 70–99)
Glucose-Capillary: 191 mg/dL — ABNORMAL HIGH (ref 70–99)

## 2021-11-30 LAB — EXPECTORATED SPUTUM ASSESSMENT W GRAM STAIN, RFLX TO RESP C

## 2021-11-30 LAB — HEPARIN LEVEL (UNFRACTIONATED): Heparin Unfractionated: 0.35 IU/mL (ref 0.30–0.70)

## 2021-11-30 MED ORDER — LIP MEDEX EX OINT: TOPICAL_OINTMENT | CUTANEOUS | Status: DC | PRN

## 2021-11-30 MED ORDER — NOREPINEPHRINE 4 MG/250ML-% IV SOLN
0.0000 ug/min | INTRAVENOUS | Status: DC
  Administered 2021-11-30: 10 ug/min via INTRAVENOUS
  Administered 2021-11-30: 3 ug/min via INTRAVENOUS
  Administered 2021-11-30: 20 ug/min via INTRAVENOUS
  Administered 2021-11-30: 15 ug/min via INTRAVENOUS
  Filled 2021-11-30 (×4): qty 250

## 2021-11-30 MED ORDER — CHLORHEXIDINE GLUCONATE 0.12% ORAL RINSE (MEDLINE KIT): 15.0000 mL | Freq: Two times a day (BID) | OROMUCOSAL | Status: DC

## 2021-11-30 MED ORDER — OXYCODONE HCL 5 MG PO TABS
5.0000 mg | ORAL_TABLET | Freq: Four times a day (QID) | ORAL | Status: DC
Start: 2021-11-30 — End: 2021-12-05
  Administered 2021-11-30 – 2021-12-05 (×18): 5 mg
  Filled 2021-11-30 (×18): qty 1

## 2021-11-30 MED ORDER — ORAL CARE MOUTH RINSE: 15.0000 mL | OROMUCOSAL | Status: DC

## 2021-11-30 NOTE — Evaluation (Signed)
Physical Therapy Evaluation Patient Details Name: Angel Brewer MRN: [id number] DOB: [birth year] Today's Date: 11/30/2021  History of Present Illness  Patient is a 56 y/o female who presents on 5/11 with SOB, admitted with acute on chronic heart failure. Had to be tx to ICU secondary to hypercarbic respiratory failure with increased obtundation, intubated 5/15. s/p trach 11/29/21. PMH includes HTN, recurrent PE on Eliquis, morbid obesity, DVT, radial ORIF and wrist ligament reconstruction.  Clinical Impression  Patient presents with generalized weakness/debility, impaired balance, pain, decreased endurance and impaired mobility s/p above. Pt lives alone and is independent PTA. Today PT/OT performed bed eval as pt declines sitting EOB despite encouragement. Able to work on AROM/exercise of BLEs and activating core pulling to sitting in bed. Pt on vent via trach PEEP 8, Fi02 50% and Sp02 dropped to 87% during exercise in bed. BP and HR stable. Pt follows commands well and attempting to write to communicate. RN aware pt needs bariatric recliner for room and will attempt to find one. Recommend transfer to chair via maxi move and attempt EOB next session as tolerated. Will follow acutely to maximize independence and mobility prior to d/c.      Recommendations for follow up therapy are one component of a multi-disciplinary discharge planning process, led by the attending physician.  Recommendations may be updated based on patient status, additional functional criteria and insurance authorization.  Follow Up Recommendations PT at Long-term acute care hospital    Assistance Recommended at Discharge Frequent or constant Supervision/Assistance  Patient can return home with the following  Two people to help with walking and/or transfers;Two people to help with bathing/dressing/bathroom;Help with stairs or ramp for entrance;Assist for transportation;Direct supervision/assist for financial management;Assistance  with Forensic psychologist (measurements PT);Wheelchair cushion (measurements PT);Hospital bed (pending improvement/progrss)  Recommendations for Other Services       Functional Status Assessment Patient has had a recent decline in their functional status and demonstrates the ability to make significant improvements in function in a reasonable and predictable amount of time.     Precautions / Restrictions Precautions Precautions: Other (comment) Precaution Comments: trach, rectal tube, prevalon boots, watch 02 Restrictions Weight Bearing Restrictions: No      Mobility  Bed Mobility Overal bed mobility: Needs Assistance Bed Mobility: Supine to Sit     Supine to sit: Mod assist, +2 for physical assistance, HOB elevated     General bed mobility comments: Pt declined EOB today but worked on pulling trunk into long sitting away from bed using therapist's UEs vs rail on bed x5. Initially needing more help transitioning to able to perform without assist with less distance moved.    Transfers                        Ambulation/Gait                  Stairs            Wheelchair Mobility    Modified Rankin (Stroke Patients Only)       Balance                                             Pertinent Vitals/Pain Pain Assessment Pain Assessment: Faces Faces Pain Scale: Hurts Dorminey more Pain Location: abdomen, trach site Pain Descriptors / Indicators: Sore, Grimacing Pain  Intervention(s): Monitored during session, Repositioned, Limited activity within patient's tolerance    Home Living Family/patient expects to be discharged to:: Private residence Living Arrangements: Alone Available Help at Discharge: Family;Available PRN/intermittently;Other (Comment) (son and niece) Type of Home: House Home Access: Stairs to enter   Technical brewer of Steps: 1   Home Layout: Able to live on main  level with bedroom/bathroom Home Equipment: Conservation officer, nature (2 wheels);BSC/3in1;Shower seat Additional Comments: reports a son and niece can assist at d.c    Prior Function Prior Level of Function : Independent/Modified Independent             Mobility Comments: Independent, drives. working ADLs Comments: independent     Hand Dominance   Dominant Hand: Right    Extremity/Trunk Assessment   Upper Extremity Assessment Upper Extremity Assessment: Defer to OT evaluation    Lower Extremity Assessment Lower Extremity Assessment: Generalized weakness;RLE deficits/detail;LLE deficits/detail RLE Deficits / Details: limited ankle AROM DF, limited knee flexion AAROM due to stiffness, good hip abduction AROM RLE Sensation: WNL LLE Deficits / Details: limited ankle AROM DF, limited knee flexion AAROM due to stiffness, good hip abduction AROM LLE Sensation: WNL       Communication   Communication: Tracheostomy  Cognition Arousal/Alertness: Awake/alert Behavior During Therapy: WFL for tasks assessed/performed Overall Cognitive Status: Difficult to assess                                 General Comments: Pt answering yes and no questions appropriately, poor awareness of how long she has been in the hospital. Able to write to communicate some. Follows commands well. Alert.        General Comments General comments (skin integrity, edema, etc.): Pt on trach via vent PEEP 8, Fi02 50%, Sp02 dropped to 87% during exercise in bed. BP and HR stable.    Exercises General Exercises - Lower Extremity Ankle Circles/Pumps: AROM, Both, 10 reps, Supine (with static stretch for heel cords) Quad Sets: AROM, Both, 10 reps, Supine Heel Slides: AAROM, Both, 5 reps, Supine Hip ABduction/ADduction: AAROM, Both, 5 reps, Supine Straight Leg Raises: AAROM, Both, 5 reps, Supine   Assessment/Plan    PT Assessment Patient needs continued PT services  PT Problem List Decreased range of  motion;Decreased strength;Decreased mobility;Impaired tone;Obesity;Pain;Decreased balance;Decreased activity tolerance;Cardiopulmonary status limiting activity       PT Treatment Interventions Patient/family education;Therapeutic activities;Balance training;Wheelchair mobility training;Therapeutic exercise;DME instruction;Gait training;Neuromuscular re-education;Functional mobility training;Cognitive remediation    PT Goals (Current goals can be found in the Care Plan section)  Acute Rehab PT Goals Patient Stated Goal: not able to state PT Goal Formulation: Patient unable to participate in goal setting Time For Goal Achievement: 12/14/21 Potential to Achieve Goals: Fair    Frequency Min 2X/week     Co-evaluation PT/OT/SLP Co-Evaluation/Treatment: Yes Reason for Co-Treatment: Complexity of the patient's impairments (multi-system involvement);For patient/therapist safety;To address functional/ADL transfers PT goals addressed during session: Mobility/safety with mobility OT goals addressed during session: Strengthening/ROM       AM-PAC PT "6 Clicks" Mobility  Outcome Measure Help needed turning from your back to your side while in a flat bed without using bedrails?: Total Help needed moving from lying on your back to sitting on the side of a flat bed without using bedrails?: Total Help needed moving to and from a bed to a chair (including a wheelchair)?: Total Help needed standing up from a chair using your arms (e.g., wheelchair  or bedside chair)?: Total Help needed to walk in hospital room?: Total Help needed climbing 3-5 steps with a railing? : Total 6 Click Score: 6    End of Session Equipment Utilized During Treatment: Oxygen (vent via trach) Activity Tolerance: Patient tolerated treatment well Patient left: in bed;with call bell/phone within reach Nurse Communication: Mobility status;Need for lift equipment;Other (comment) (find bari recliner chair) PT Visit Diagnosis:  Muscle weakness (generalized) (M62.81);Pain Pain - part of body:  (abdomen)    Time: 1020-1050 PT Time Calculation (min) (ACUTE ONLY): 30 min   Charges:   PT Evaluation $PT Eval High Complexity: 1 High          Marisa Severin, PT, DPT Acute Rehabilitation Services Secure chat preferred Office [place] 11/30/2021, 12:17 PM

## 2021-11-30 NOTE — Progress Notes (Signed)
NAMEFlor Brewer, MRN:  [id number], DOB:  05-Mar-1966, LOS: 9 ADMISSION DATE:  11/21/2021, CONSULTATION DATE: 11/23/2021 REFERRING MD:  Benson Norway, CHIEF COMPLAINT: Obtundation  History of Present Illness:  56 year old woman who was admitted with increasing shortness of breath.  Now in hypercarbic respiratory failure with increasing obtundation.  Unable to provide history at this time.  Spoke with niece who reports increasing shortness of breath over the last few days.  No other associated symptoms.  Prior admissions for similar but has never been intubated.  Initially no hypercarbia.  Now PCO2 in the 70s.  Was initially responding to questions this morning but is no longer doing so. She has not been using CPAP for several years now has her machine has broken down and she is awaiting a repeat sleep study.  Pertinent  Medical History   Past Medical History:  Diagnosis Date   DVT (deep vein thrombosis) in pregnancy    RLE DVT 02/2016   Dyspnea    Morbid obesity (HCC)    PE (pulmonary embolism) 02/29/2016   Sleep apnea    uses a cpap    Significant Hospital Events: Including procedures, antibiotic start and stop dates in addition to other pertinent events   5/11 initial admission to hospitalist service.  Started on BiPAP and diuresed successfully for 5 L. 5/13 increasing somnolence and hypercarbia new since admission. 5/15 am intubated. Discussed trach with son who wanted to give her a chance of extubation before proceeding straight to trach 5/16 failed SBT for tachypnea 5/17 failed SBT for tachypnea 5/18 failed SBT for rapid and shallow breathing despite high PS 5/19 tracheostomy placed  Interim History / Subjective:  Endorsing pain. On 50% FiO2 and 8 peep RT notes some secretions.   Objective   Blood pressure (!) 112/46, pulse 62, temperature 99.1 F (37.3 C), temperature source Oral, resp. rate 18, height _0  (1.727 m), weight (!) 191 kg, last menstrual period  11/29/2015, SpO2 95 %. CVP:  [14 mmHg-71 mmHg] 40 mmHg  Vent Mode: PRVC FiO2 (%):  [50 %-100 %] 50 % Set Rate:  [12 bmp] 12 bmp Vt Set:  [380 mL] 380 mL PEEP:  [8 cmH20] 8 cmH20 Plateau Pressure:  [24 cmH20-28 cmH20] 25 cmH20   Intake/Output Summary (Last 24 hours) at 11/30/2021 0916 Last data filed at 11/30/2021 0500 Gross per 24 hour  Intake 1560.54 ml  Output 1520 ml  Net 40.54 ml   Filed Weights   11/26/21 0456 11/28/21 0220 11/29/21 0439  Weight: (!) 186.9 kg (!) 190.5 kg (!) 191 kg    Examination: Gen:      Intubated by trach. No respiratory distress HEENT: trach to vent Lungs:    sounds of mechanical ventilation auscultated diminished, no wheeze CV:         RRR no mrg Abd:      + bowel sounds; soft, non-tender; no palpable masses, no distension Ext:    No edema Skin:      Warm and dry; no rashes Neuro:   sedated, RASS 0, moves all 4 extremities  Chest xray reviewed - cardiomegaly and bilateral pulmonary vascular congestion vs pna.     Assessment & Plan:   Acute respiratory failure with hypoxia and hypercapnia (HCC) Untreated OHS/OSA -LTVV, VAP bundle, PAD protocol - trach placed 5/19 6.0 shiley  - still on fentanyl. Will schedule oxycodone to help with pain control and titrate off fentanyl.  - maintain minimum PEEP of 8 - will send sputum culture  History of pulmonary embolus (PE) - heparin gtt for now   Acute on chronic heart failure with preserved ejection fraction (HFpEF) ([place]) -hold home amlodipine -Diurese for net negative fluid balance - continue lasix 60 mg daily IV  Hyperlipidemia -home atorvastatin  Hypokalemia - replace and repeat BMET Best Practice (right click and "Reselect all SmartList Selections" daily)   Diet/type: NPO, tube feeds DVT prophylaxis: systemic heparin GI prophylaxis: N/A Lines: N/A - place PICC today Foley:  N/A Code Status:  full code Last date of multidisciplinary goals of care discussion [Updated's son 5/18 over  the phone and consented for tracheostomy and bronchoscopy assistance.]  The patient is critically ill due to respiratory failure.  Critical care was necessary to treat or prevent imminent or life-threatening deterioration.  Critical care was time spent personally by me on the following activities: development of treatment plan with patient and/or surrogate as well as nursing, discussions with consultants, evaluation of patient's response to treatment, examination of patient, obtaining history from patient or surrogate, ordering and performing treatments and interventions, ordering and review of laboratory studies, ordering and review of radiographic studies, pulse oximetry, re-evaluation of patient's condition and participation in multidisciplinary rounds.   Critical Care Time devoted to patient care services described in this note is 32 minutes. This time reflects time of care of this [place] . This critical care time does not reflect separately billable procedures or procedure time, teaching time or supervisory time of PA/NP/Med student/Med Resident etc but could involve care discussion time.       Spero Geralds [facility] 11/30/2021 9:16 AM  Pager: see AMION  If no response to pager , please call critical care on call (see AMION) until 7pm After 7:00 pm call Elink

## 2021-11-30 NOTE — Progress Notes (Signed)
[place] for heparin Indication: pulmonary embolus  No Known Allergies  Patient Measurements: Height: _0  (172.7 cm) Weight: (!) 191 kg (421 lb 1.3 oz) IBW/kg (Calculated) : 63.9 Heparin Dosing Weight: 114kg  Vital Signs: Temp: 99.8 F (37.7 C) (05/20 1200) Temp Source: Oral (05/20 1200) BP: 131/115 (05/20 1400) Pulse Rate: 69 (05/20 1600)  Labs: Recent Labs    11/28/21 0350 11/29/21 0438 11/30/21 0452  HGB 12.6 12.1 11.3*  HCT 41.4 39.8 39.8  PLT 197 198 222  HEPARINUNFRC 0.65 0.55 0.35  CREATININE 0.94 0.95 0.97     Estimated Creatinine Clearance: 117.3 mL/min (by C-G formula based on SCr of 0.97 mg/dL).   Medical History: Past Medical History:  Diagnosis Date   DVT (deep vein thrombosis) in pregnancy    RLE DVT 02/2016   Dyspnea    Morbid obesity (HCC)    PE (pulmonary embolism) 02/29/2016   Sleep apnea    uses a cpap   Medications:  Infusions:   sodium chloride Stopped (11/25/21 1243)   feeding supplement (VITAL AF 1.2 CAL) 70 mL/hr at 11/30/21 1600   fentaNYL infusion INTRAVENOUS 25 mcg/hr (11/30/21 1600)   heparin 2,200 Units/hr (11/30/21 1600)   norepinephrine (LEVOPHED) Adult infusion 10 mcg/min (11/30/21 1600)    Assessment: 56 yo female on PTA Eliquis for her hx of PE. Transferred to ICU for intubation. Her last dose was 5/13 in AM. Pharmacy to dose heparin.   Heparin drip 2200 units/hr with heparin level 0.35 at goal. No issues noted. CBC continues to be within normal limits and stable.   Goal of Therapy:  Heparin level 0.3-0.7 units/ml Monitor platelets by anticoagulation protocol: Yes   Plan:  Continue heparin 2200 units/hr Daily heparin level and CBC Monitor for s/sx of bleeding   Bonnita Nasuti Pharm.D. CPP, BCPS Clinical Pharmacist [phone] 11/30/2021 4:17 PM   Please check [email or web address] for unit-specific pharmacy phone numbers.

## 2021-11-30 NOTE — Evaluation (Signed)
Occupational Therapy Evaluation Patient Details Name: Angel Brewer MRN: [id number] DOB: [birth year] Today's Date: 11/30/2021   History of Present Illness Patient is a 56 y/o female who presents on 5/11 with SOB, admitted with acute on chronic heart failure. Had to be tx to ICU secondary to hypercarbic respiratory failure with increased obtundation, intubated 5/15. s/p trach 11/29/21. PMH includes HTN, recurrent PE on Eliquis, morbid obesity, DVT, radial ORIF and wrist ligament reconstruction.   Clinical Impression   Pt is typically mod I for ADL and mobility. She has AE for LB ADL, and lives alone. She drives and is employed. Today she communicated well with yes/no questions and even wrote out some communication. Cognition seems to be [facility], following commands - but will continue to assess. Strength in BUE seems generally strong (overall 4/5) and ROM WFL (a Bellina limited by lines) she will benefit from therabands that she can use at bed level with an established HEP (to be done next session). Pt declined EOB this session but did engage core pulling her back off the raised HOB x3 with mod A +2. Total A for LB ADL at this time and max A for UB with back. Min A for grooming at bed level. OT will continue to follow acutely- at this time recommending LTAC post-acute and next time will attempt EOB and establish theraband HEP for BUE.     Recommendations for follow up therapy are one component of a multi-disciplinary discharge planning process, led by the attending physician.  Recommendations may be updated based on patient status, additional functional criteria and insurance authorization.   Follow Up Recommendations  OT at Long-term acute care hospital    Assistance Recommended at Discharge    Patient can return home with the following      Functional Status Assessment     Equipment Recommendations  Other (comment) (defer at this time)    Recommendations for Other Services PT consult;Speech  consult     Precautions / Restrictions Precautions Precautions: Other (comment) Precaution Comments: trach, rectal tube, prevalon boots, watch 02 Restrictions Weight Bearing Restrictions: No      Mobility Bed Mobility Overal bed mobility: Needs Assistance Bed Mobility: Supine to Sit     Supine to sit: Mod assist, +2 for physical assistance, HOB elevated     General bed mobility comments: Pt declined EOB today but worked on pulling trunk into long sitting away from bed using therapist's UEs vs rail on bed x5. Initially needing more help transitioning to able to perform without assist with less distance moved.    Transfers                          Balance                                           ADL either performed or assessed with clinical judgement   ADL Overall ADL's : Needs assistance/impaired Eating/Feeding: NPO   Grooming: Wash/dry face;Set up;Bed level (HOB elevated)   Upper Body Bathing: Maximal assistance;Bed level Upper Body Bathing Details (indicate cue type and reason): pt able to participate in front, max A for back Lower Body Bathing: Total assistance;Bed level   Upper Body Dressing : Maximal assistance   Lower Body Dressing: Total assistance;Bed level   Toilet Transfer:  (lift only currently or bed level rolling)   Toileting-  Clothing Manipulation and Hygiene: Total assistance;Bed level       Functional mobility during ADLs: Total assistance (lift only)       Vision Patient Visual Report: No change from baseline Additional Comments: makes good eye contact, tracking and intentional gaze [facility]     Perception     Praxis      Pertinent Vitals/Pain Pain Assessment Faces Pain Scale: Hurts Ruan more Pain Location: abdomen, trach site Pain Descriptors / Indicators: Sore, Grimacing     Hand Dominance Right   Extremity/Trunk Assessment Upper Extremity Assessment Upper Extremity Assessment: Generalized  weakness;RUE deficits/detail;LUE deficits/detail RUE Deficits / Details: grasp 4/5, elbow AROM WFL, shoulder AROM WFL limited by lines - able to bring grooming items to face RUE Sensation: WNL LUE Deficits / Details: grasp 4/5, elbow AROM, shoulder AROM WFL limited lines LUE Sensation: WNL   Lower Extremity Assessment Lower Extremity Assessment: Defer to PT evaluation RLE Deficits / Details: limited ankle AROM DF, limited knee flexion AAROM due to stiffness, good hip abduction AROM RLE Sensation: WNL LLE Deficits / Details: limited ankle AROM DF, limited knee flexion AAROM due to stiffness, good hip abduction AROM LLE Sensation: WNL   Cervical / Trunk Assessment Cervical / Trunk Assessment: Other exceptions Cervical / Trunk Exceptions: morbid obesity   Communication Communication Communication: Tracheostomy   Cognition Arousal/Alertness: Awake/alert Behavior During Therapy: WFL for tasks assessed/performed Overall Cognitive Status: Difficult to assess                                 General Comments: Pt answering yes and no questions appropriately, poor awareness of how long she has been in the hospital. Able to write to communicate some. Follows commands well. Alert.     General Comments  Pt on trach via vent PEEP 8, Fi02 50%, Sp02 dropped to 87% during exercise in bed. BP and HR stable.    Exercises     Shoulder Instructions      Home Living Family/patient expects to be discharged to:: Private residence Living Arrangements: Alone Available Help at Discharge: Family;Available PRN/intermittently;Other (Comment) (son and niece) Type of Home: House Home Access: Stairs to enter Technical brewer of Steps: 1   Home Layout: Able to live on main level with bedroom/bathroom     Bathroom Shower/Tub: Occupational psychologist: Handicapped height Bathroom Accessibility: Yes   Home Equipment: Conservation officer, nature (2 wheels);BSC/3in1;Shower seat    Additional Comments: reports a son and niece can assist at d.c      Prior Functioning/Environment Prior Level of Function : Independent/Modified Independent             Mobility Comments: Independent, drives. working ADLs Comments: independent        OT Problem List: Decreased activity tolerance;Impaired balance (sitting and/or standing);Cardiopulmonary status limiting activity;Obesity;Pain      OT Treatment/Interventions: Self-care/ADL training;Therapeutic exercise;Energy conservation;DME and/or AE instruction;Therapeutic activities;Patient/family education;Balance training    OT Goals(Current goals can be found in the care plan section) Acute Rehab OT Goals Patient Stated Goal: get stronger OT Goal Formulation: With patient Time For Goal Achievement: 12/14/21 Potential to Achieve Goals: Good ADL Goals Pt Will Perform Grooming: with set-up;bed level Pt Will Perform Upper Body Bathing: with adaptive equipment;sitting;with mod assist Pt Will Perform Lower Body Bathing: with mod assist;sitting/lateral leans;with adaptive equipment Pt Will Transfer to Toilet: with max assist;with +2 assist;squat pivot transfer;bedside commode Pt Will Perform Toileting - Clothing Manipulation and  hygiene: with max assist Pt/caregiver will Perform Home Exercise Program: Both right and left upper extremity;Increased strength;With theraband;With written HEP provided Additional ADL Goal #1: Pt will perform bed mobility to come EOB as precursor to participation in ADL at mod A +2  OT Frequency: Min 2X/week    Co-evaluation PT/OT/SLP Co-Evaluation/Treatment: Yes Reason for Co-Treatment: Complexity of the patient's impairments (multi-system involvement);For patient/therapist safety;To address functional/ADL transfers;Other (comment) (morbid obesity) PT goals addressed during session: Mobility/safety with mobility;Strengthening/ROM OT goals addressed during session: ADL's and  self-care;Strengthening/ROM      AM-PAC OT "6 Clicks" Daily Activity     Outcome Measure Help from another person eating meals?: Total (NPO) Help from another person taking care of personal grooming?: A [place] Help from another person toileting, which includes using toliet, bedpan, or urinal?: Total Help from another person bathing (including washing, rinsing, drying)?: A Lot Help from another person to put on and taking off regular upper body clothing?: A Lot Help from another person to put on and taking off regular lower body clothing?: Total 6 Click Score: 10   End of Session Equipment Utilized During Treatment: Oxygen Nurse Communication: Mobility status;Need for lift equipment  Activity Tolerance: Patient tolerated treatment well Patient left: in bed;with call bell/phone within reach;with bed alarm set  OT Visit Diagnosis: Other abnormalities of gait and mobility (R26.89);Unsteadiness on feet (R26.81);Muscle weakness (generalized) (M62.81);Other (comment) (cardiopulmonary status limiting activity)                Time: 3709-6438 OT Time Calculation (min): 30 min Charges:  OT General Charges $OT Visit: 1 Visit OT Evaluation $OT Eval High Complexity: 1 High Jesse Sans OTR/L Acute Rehabilitation Services Pager: [phone] Office: [place] 11/30/2021, 1:44 PM

## 2021-11-30 NOTE — Progress Notes (Signed)
[facility] Progress Note Patient Name: [name] DOB: 17-Feb-1966 MRN: [id number]   Date of Service  11/30/2021  HPI/Events of Note  Nursing request to change Norepinephrine IV infusion order from PIV to central line. Patient has PICC line.  eICU Interventions  Will change Norepinephrine IV infusion orders to via central line.      Intervention Category Major Interventions: Hypotension - evaluation and management  Clyde Upshaw [place] 11/30/2021, 6:07 AM

## 2021-12-01 DIAGNOSIS — E662 Morbid (severe) obesity with alveolar hypoventilation: Secondary | ICD-10-CM | POA: Diagnosis not present

## 2021-12-01 DIAGNOSIS — J9601 Acute respiratory failure with hypoxia: Secondary | ICD-10-CM | POA: Diagnosis not present

## 2021-12-01 DIAGNOSIS — G9341 Metabolic encephalopathy: Secondary | ICD-10-CM | POA: Diagnosis not present

## 2021-12-01 DIAGNOSIS — I5033 Acute on chronic diastolic (congestive) heart failure: Secondary | ICD-10-CM | POA: Diagnosis not present

## 2021-12-01 LAB — GLUCOSE, CAPILLARY
Glucose-Capillary: 101 mg/dL — ABNORMAL HIGH (ref 70–99)
Glucose-Capillary: 108 mg/dL — ABNORMAL HIGH (ref 70–99)
Glucose-Capillary: 111 mg/dL — ABNORMAL HIGH (ref 70–99)
Glucose-Capillary: 120 mg/dL — ABNORMAL HIGH (ref 70–99)
Glucose-Capillary: 125 mg/dL — ABNORMAL HIGH (ref 70–99)
Glucose-Capillary: 139 mg/dL — ABNORMAL HIGH (ref 70–99)

## 2021-12-01 LAB — HEPARIN LEVEL (UNFRACTIONATED): Heparin Unfractionated: 0.4 IU/mL (ref 0.30–0.70)

## 2021-12-01 LAB — CBC
HCT: 36.8 % (ref 36.0–46.0)
Hemoglobin: 11 g/dL — ABNORMAL LOW (ref 12.0–15.0)
MCH: 28.6 pg (ref 26.0–34.0)
MCHC: 29.9 g/dL — ABNORMAL LOW (ref 30.0–36.0)
MCV: 95.6 fL (ref 80.0–100.0)
Platelets: 214 10*3/uL (ref 150–400)
RBC: 3.85 MIL/uL — ABNORMAL LOW (ref 3.87–5.11)
RDW: 14.1 % (ref 11.5–15.5)
WBC: 8.4 10*3/uL (ref 4.0–10.5)
nRBC: 0 % (ref 0.0–0.2)

## 2021-12-01 LAB — BASIC METABOLIC PANEL
Anion gap: 5 (ref 5–15)
BUN: 30 mg/dL — ABNORMAL HIGH (ref 6–20)
CO2: 41 mmol/L — ABNORMAL HIGH (ref 22–32)
Calcium: 9.3 mg/dL (ref 8.9–10.3)
Chloride: 91 mmol/L — ABNORMAL LOW (ref 98–111)
Creatinine, Ser: 0.78 mg/dL (ref 0.44–1.00)
GFR, Estimated: >60 mL/min (ref >60)
Glucose, Bld: 123 mg/dL — ABNORMAL HIGH (ref 70–99)
Potassium: 4.2 mmol/L (ref 3.5–5.1)
Sodium: 137 mmol/L (ref 135–145)

## 2021-12-01 MED ORDER — NYSTATIN 100000 UNIT/ML MT SUSP
5.0000 mL | Freq: Three times a day (TID) | OROMUCOSAL | Status: DC
  Administered 2021-12-01 (×4): 500000 [IU] via ORAL
  Filled 2021-12-01 (×4): qty 5

## 2021-12-01 MED ORDER — CLONAZEPAM 0.1 MG/ML ORAL SUSPENSION: 0.2500 mg | Freq: Two times a day (BID) | ORAL | Status: DC

## 2021-12-01 MED ORDER — CLONAZEPAM 0.25 MG PO TBDP
0.2500 mg | ORAL_TABLET | Freq: Two times a day (BID) | ORAL | Status: DC
  Administered 2021-12-01 – 2021-12-02 (×4): 0.25 mg via ORAL
  Filled 2021-12-01 (×4): qty 1

## 2021-12-01 NOTE — Progress Notes (Addendum)
[name], MRN:  [id number], DOB:  08-24-1965, LOS: 10 ADMISSION DATE:  11/21/2021, CONSULTATION DATE: 11/23/2021 REFERRING MD:  Angel Brewer, CHIEF COMPLAINT: Obtundation  History of Present Illness:  56 year old woman who was admitted with increasing shortness of breath.  Now in hypercarbic respiratory failure with increasing obtundation.  Unable to provide history at this time.  Spoke with niece who reports increasing shortness of breath over the last few days.  No other associated symptoms.  Prior admissions for similar but has never been intubated.  Initially no hypercarbia.  Now PCO2 in the 70s.  Was initially responding to questions this morning but is no longer doing so. She has not been using CPAP for several years now has her machine has broken down and she is awaiting a repeat sleep study.  Pertinent  Medical History   Past Medical History:  Diagnosis Date   DVT (deep vein thrombosis) in pregnancy    RLE DVT 02/2016   Dyspnea    Morbid obesity (HCC)    PE (pulmonary embolism) 02/29/2016   Sleep apnea    uses a cpap    Significant Hospital Events: Including procedures, antibiotic start and stop dates in addition to other pertinent events   5/11 initial admission to hospitalist service.  Started on BiPAP and diuresed successfully for 5 L. 5/13 increasing somnolence and hypercarbia new since admission. 5/15 am intubated. Discussed trach with son who wanted to give her a chance of extubation before proceeding straight to trach 5/16 failed SBT for tachypnea 5/17 failed SBT for tachypnea 5/18 failed SBT for rapid and shallow breathing despite high PS 5/19 tracheostomy placed  Interim History / Subjective:  On 40% FiO2. Nursing and RRT endorse that pain is controlled but anxiety limits PS trials.   Objective   Blood pressure (!) 144/58, pulse 66, temperature 98.3 F (36.8 C), temperature source Oral, resp. rate 17, height _0  (1.727 m), weight (!) 190 kg, last  menstrual period 11/29/2015, SpO2 93 %.    Vent Mode: PRVC FiO2 (%):  [40 %-50 %] 40 % Set Rate:  [12 bmp] 12 bmp Vt Set:  [380 mL] 380 mL PEEP:  [8 cmH20] 8 cmH20 Plateau Pressure:  [26 cmH20-29 cmH20] 29 cmH20   Intake/Output Summary (Last 24 hours) at 12/01/2021 1008 Last data filed at 12/01/2021 0900 Gross per 24 hour  Intake 2692.04 ml  Output 2175 ml  Net 517.04 ml   Filed Weights   11/28/21 0220 11/29/21 0439 12/01/21 0434  Weight: (!) 190.5 kg (!) 191 kg (!) 190 kg    Examination: Gen:      Intubated, sedated, acutely ill appearing HEENT:  ETT to vent Lungs:    sounds of mechanical ventilation auscultated , diminished no wheeze CV:         RRR no mrg Abd:     Obese, soft Ext:    No edema Skin:      Warm and dry; no rashes Neuro:   RASS 0, moves all 4 extremities   Labs reviewed. Was hypoglycemic Bna 137 K 4.2 Co2 41 WbC 8.4 Hgb 11  Sputum culture Shows no organisms, many PMNs Reincubated for further growth    Assessment & Plan:   Acute respiratory failure with hypoxia and hypercapnia (HCC) Untreated OHS/OSA -LTVV, VAP bundle, PAD protocol - trach placed 5/19 6.0 shiley  - pain controlled with scheduled oxycodone. Still anxious so will schedule low dose clonazepam to help with vent weaning - PS trials as tolerated -  maintain minimum PEEP of 8 - sputum cx sent 5/20 ngtd  History of pulmonary embolus (PE) - heparin gtt for now  - can probably transition back to oral agent soon  Acute on chronic heart failure with preserved ejection fraction (HFpEF) ([place]) -hold home amlodipine -Diurese for net negative fluid balance - continue lasix 60 mg daily IV  Hyperlipidemia -home atorvastatin  Hypokalemia - replace and repeat BMET  Type 2 DM Will decrease insulin given hypoglycemic episode  Best Practice (right click and "Reselect all SmartList Selections" daily)   Diet/type: NPO, tube feeds DVT prophylaxis: systemic heparin GI prophylaxis:  N/A Lines: N/A - place PICC today Foley:  N/A Code Status:  full code need to start LTACH planning Last date of multidisciplinary goals of care discussion [Updated's son 5/18 over the phone and consented for tracheostomy and bronchoscopy assistance.]  The patient is critically ill due to respiratory failure.  Critical care was necessary to treat or prevent imminent or life-threatening deterioration.  Critical care was time spent personally by me on the following activities: development of treatment plan with patient and/or surrogate as well as nursing, discussions with consultants, evaluation of patient's response to treatment, examination of patient, obtaining history from patient or surrogate, ordering and performing treatments and interventions, ordering and review of laboratory studies, ordering and review of radiographic studies, pulse oximetry, re-evaluation of patient's condition and participation in multidisciplinary rounds.   Critical Care Time devoted to patient care services described in this note is 32 minutes. This time reflects time of care of this Angel Brewer . This critical care time does not reflect separately billable procedures or procedure time, teaching time or supervisory time of PA/NP/Med student/Med Resident etc but could involve care discussion time.       Angel Brewer [facility] 12/01/2021 10:08 AM  Pager: see AMION  If no response to pager , please call critical care on call (see AMION) until 7pm After 7:00 pm call Elink

## 2021-12-01 NOTE — Progress Notes (Signed)
[facility] Progress Note Patient Name: Angel Brewer DOB: [birth year] MRN: [id number]   Date of Service  12/01/2021  HPI/Events of Note  Patient has what appears to be oral thrush,  eICU Interventions  Nystatin oral suspension ordered.        Frederik Pear 12/01/2021, 6:38 AM

## 2021-12-01 NOTE — Progress Notes (Signed)
ANTICOAGULATION CONSULT NOTE  Pharmacy Consult for heparin Indication: pulmonary embolus  No Known Allergies  Patient Measurements: Height: 5' 8" (172.7 cm) Weight: (!) 190 kg (418 lb 14 oz) IBW/kg (Calculated) : 63.9 Heparin Dosing Weight: 114kg  Vital Signs: Temp: 98.6 F (37 C) (05/21 1147) Temp Source: Oral (05/21 1147) BP: 125/68 (05/21 1400) Pulse Rate: 67 (05/21 1400)  Labs: Recent Labs    11/29/21 0438 11/30/21 0452 12/01/21 0425  HGB 12.1 11.3* 11.0*  HCT 39.8 39.8 36.8  PLT 198 222 214  HEPARINUNFRC 0.55 0.35 0.40  CREATININE 0.95 0.97 0.78     Estimated Creatinine Clearance: 141.7 mL/min (by C-G formula based on SCr of 0.78 mg/dL).   Medical History: Past Medical History:  Diagnosis Date   DVT (deep vein thrombosis) in pregnancy    RLE DVT 02/2016   Dyspnea    Morbid obesity (HCC)    PE (pulmonary embolism) 02/29/2016   Sleep apnea    uses a cpap   Medications:  Infusions:   sodium chloride Stopped (11/25/21 1243)   feeding supplement (VITAL AF 1.2 CAL) 70 mL/hr at 12/01/21 1400   heparin 2,200 Units/hr (12/01/21 1400)   norepinephrine (LEVOPHED) Adult infusion Stopped (12/01/21 0406)    Assessment: 56 yo female on PTA Eliquis for her hx of PE. Transferred to ICU for intubation. Her last dose was 5/13 in AM. Pharmacy to dose heparin.   Heparin drip 2200 units/hr with heparin level 0.4 at goal. No issues noted. CBC continues to be within normal limits and stable.   Goal of Therapy:  Heparin level 0.3-0.7 units/ml Monitor platelets by anticoagulation protocol: Yes   Plan:  Continue heparin 2200 units/hr Daily heparin level and CBC Monitor for s/sx of bleeding   Bonnita Nasuti Pharm.D. CPP, BCPS Clinical Pharmacist [phone] 12/01/2021 2:45 PM   Please check [email or web address] for unit-specific pharmacy phone numbers.

## 2021-12-02 ENCOUNTER — Inpatient Hospital Stay (HOSPITAL_COMMUNITY): Payer: Medicaid Other

## 2021-12-02 DIAGNOSIS — I5033 Acute on chronic diastolic (congestive) heart failure: Secondary | ICD-10-CM | POA: Diagnosis not present

## 2021-12-02 DIAGNOSIS — K6389 Other specified diseases of intestine: Secondary | ICD-10-CM | POA: Diagnosis not present

## 2021-12-02 DIAGNOSIS — Z4682 Encounter for fitting and adjustment of non-vascular catheter: Secondary | ICD-10-CM | POA: Diagnosis not present

## 2021-12-02 LAB — CBC
HCT: 37.9 % (ref 36.0–46.0)
Hemoglobin: 11.1 g/dL — ABNORMAL LOW (ref 12.0–15.0)
MCH: 28.3 pg (ref 26.0–34.0)
MCHC: 29.3 g/dL — ABNORMAL LOW (ref 30.0–36.0)
MCV: 96.7 fL (ref 80.0–100.0)
Platelets: 227 10*3/uL (ref 150–400)
RBC: 3.92 MIL/uL (ref 3.87–5.11)
RDW: 14.3 % (ref 11.5–15.5)
WBC: 7.5 10*3/uL (ref 4.0–10.5)
nRBC: 0 % (ref 0.0–0.2)

## 2021-12-02 LAB — BASIC METABOLIC PANEL
Anion gap: 8 (ref 5–15)
Anion gap: 8 (ref 5–15)
BUN: 30 mg/dL — ABNORMAL HIGH (ref 6–20)
BUN: 27 mg/dL — ABNORMAL HIGH (ref 6–20)
CO2: 41 mmol/L — ABNORMAL HIGH (ref 22–32)
CO2: 41 mmol/L — ABNORMAL HIGH (ref 22–32)
Calcium: 9.6 mg/dL (ref 8.9–10.3)
Calcium: 10.2 mg/dL (ref 8.9–10.3)
Chloride: 90 mmol/L — ABNORMAL LOW (ref 98–111)
Chloride: 90 mmol/L — ABNORMAL LOW (ref 98–111)
Creatinine, Ser: 0.8 mg/dL (ref 0.44–1.00)
Creatinine, Ser: 0.84 mg/dL (ref 0.44–1.00)
GFR, Estimated: >60 mL/min (ref >60)
GFR, Estimated: >60 mL/min (ref >60)
Glucose, Bld: 113 mg/dL — ABNORMAL HIGH (ref 70–99)
Glucose, Bld: 91 mg/dL (ref 70–99)
Potassium: 4.4 mmol/L (ref 3.5–5.1)
Potassium: 4.4 mmol/L (ref 3.5–5.1)
Sodium: 139 mmol/L (ref 135–145)
Sodium: 139 mmol/L (ref 135–145)

## 2021-12-02 LAB — GLUCOSE, CAPILLARY
Glucose-Capillary: 111 mg/dL — ABNORMAL HIGH (ref 70–99)
Glucose-Capillary: 91 mg/dL (ref 70–99)
Glucose-Capillary: 80 mg/dL (ref 70–99)
Glucose-Capillary: 85 mg/dL (ref 70–99)
Glucose-Capillary: 98 mg/dL (ref 70–99)

## 2021-12-02 LAB — HEPARIN LEVEL (UNFRACTIONATED): Heparin Unfractionated: 0.53 IU/mL (ref 0.30–0.70)

## 2021-12-02 LAB — CULTURE, RESPIRATORY W GRAM STAIN: Culture: NORMAL

## 2021-12-02 MED ORDER — ACETAZOLAMIDE SODIUM 500 MG IJ SOLR
500.0000 mg | Freq: Two times a day (BID) | INTRAMUSCULAR | Status: AC
  Administered 2021-12-02 (×2): 500 mg via INTRAVENOUS
  Filled 2021-12-02 (×2): qty 500

## 2021-12-02 MED ORDER — DEXTROSE 10 % IV SOLN: INTRAVENOUS | Status: DC

## 2021-12-02 MED ORDER — DEXTROSE IN LACTATED RINGERS 5 % IV SOLN: INTRAVENOUS | Status: DC

## 2021-12-02 MED ORDER — FUROSEMIDE 10 MG/ML IJ SOLN
60.0000 mg | Freq: Three times a day (TID) | INTRAMUSCULAR | Status: DC
  Administered 2021-12-02 – 2021-12-04 (×6): 60 mg via INTRAVENOUS
  Filled 2021-12-02 (×6): qty 6

## 2021-12-02 MED ORDER — VITAL HIGH PROTEIN PO LIQD: 1000.0000 mL | ORAL | Status: DC

## 2021-12-02 MED ORDER — VITAL AF 1.2 CAL PO LIQD
1000.0000 mL | ORAL | Status: DC
  Administered 2021-12-02 – 2021-12-09 (×7): 1000 mL
  Filled 2021-12-02 (×6): qty 1000

## 2021-12-02 MED ORDER — PROSOURCE TF PO LIQD
90.0000 mL | Freq: Three times a day (TID) | ORAL | Status: DC
  Administered 2021-12-02 – 2021-12-07 (×16): 90 mL
  Filled 2021-12-02 (×16): qty 90

## 2021-12-02 MED ORDER — FENTANYL CITRATE PF 50 MCG/ML IJ SOSY
12.5000 ug | PREFILLED_SYRINGE | INTRAMUSCULAR | Status: DC | PRN
  Administered 2021-12-02 (×2): 50 ug via INTRAVENOUS
  Filled 2021-12-02 (×2): qty 1

## 2021-12-02 NOTE — Progress Notes (Signed)
Nutrition Follow-up  DOCUMENTATION CODES:   Obesity unspecified  INTERVENTION:   Tube Feeding via Cortrak:  Vital AF 1.2 at 55 ml/hr Pro-Source TF 90 mL TID This provides 1840 kcals, 165 g of protein and 1069 mL of free water   NUTRITION DIAGNOSIS:   Inadequate oral intake related to inability to eat as evidenced by NPO status.  Being addressed via TF   GOAL:   Patient will meet greater than or equal to 90% of their needs  Progressing  MONITOR:   Diet advancement, Vent status, Weight trends, Labs, I & O's, TF tolerance  REASON FOR ASSESSMENT:   Consult Enteral/tube feeding initiation and management  ASSESSMENT:   56 y.o. female presented to the ED with dyspnea. PMH includes COPD, HTN, and CHF. Pt admitted with acute on chronic heart failure and acute respiratory failure with hypoxia.  5/13 - intubated 5/14 - TF initiated 5/15 - Cortrak placed (tip post-pyloric) 5/19 - Trach placed 5/22 - Cortrak replaced  Pt remains on vent support via trach; coughed out Cortrak, limited trach trial  Previously tolerating TF at goal rate via Cortrak  Current wt 190 kg; admit weight 196 kg. Net negative 6 L  Skin assessment reviewed; no pressure injuries noted.   Labs: reviewed Meds: miralax, senna-docusate, ss novolog, lasix    Diet Order:   Diet Order             Diet NPO time specified  Diet effective now                   EDUCATION NEEDS:   Not appropriate for education at this time  Skin:  Skin Assessment: Reviewed RN Assessment  Last BM:  5/22  Height:   Ht Readings from Last 1 Encounters:  11/24/21 5' 8" (1.727 m)    Weight:   Wt Readings from Last 1 Encounters:  12/01/21 (!) 190 kg    Ideal Body Weight:  63.6 kg  BMI:  Body mass index is 63.69 kg/m.  Estimated Nutritional Needs:   Kcal:  1600-1800 kcals  Protein:  140-175 g  Fluid:  >/= 1.6 L    Kerman Passey MS, RDN, LDN, CNSC Registered Dietitian III Clinical  Nutrition RD Pager and On-Call Pager Number Located in [facility]

## 2021-12-02 NOTE — Progress Notes (Signed)
NAMEAdisson Brewer, MRN:  [id number], DOB:  [birth year], LOS: 11 ADMISSION DATE:  11/21/2021, CONSULTATION DATE: 11/23/2021 REFERRING MD:  Angel Brewer, CHIEF COMPLAINT: Obtundation  History of Present Illness:  56 year old woman who was admitted with increasing shortness of breath.  Now in hypercarbic respiratory failure with increasing obtundation.  Unable to provide history at this time.  Spoke with niece who reports increasing shortness of breath over the last few days.  No other associated symptoms.  Prior admissions for similar but has never been intubated.  Initially no hypercarbia.  Now PCO2 in the 70s.  Was initially responding to questions this morning but is no longer doing so. She has not been using CPAP for several years now has her machine has broken down and she is awaiting a repeat sleep study.  Pertinent  Medical History   Past Medical History:  Diagnosis Date   DVT (deep vein thrombosis) in pregnancy    RLE DVT 02/2016   Dyspnea    Morbid obesity (HCC)    PE (pulmonary embolism) 02/29/2016   Sleep apnea    uses a cpap    Significant Hospital Events: Including procedures, antibiotic start and stop dates in addition to other pertinent events   5/11 initial admission to hospitalist service.  Started on BiPAP and diuresed successfully for 5 L. 5/13 increasing somnolence and hypercarbia new since admission. 5/15 am intubated. Discussed trach with son who wanted to give her a chance of extubation before proceeding straight to trach 5/16 failed SBT for tachypnea 5/17 failed SBT for tachypnea 5/18 failed SBT for rapid and shallow breathing despite high PS 5/19 tracheostomy placed  Interim History / Subjective:  No events. Having coughing fits due to trach discomfort. Limited trach trial yesterday.  Coughed out cortrak.  Objective   Blood pressure (!) 129/49, pulse 75, temperature 99.2 F (37.3 C), temperature source Oral, resp. rate (!) 21, height 5' 8" (1.727  m), weight (!) 190 kg, last menstrual period 11/29/2015, SpO2 91 %.    Vent Mode: PRVC FiO2 (%):  [40 %-50 %] 50 % Set Rate:  [12 bmp] 12 bmp Vt Set:  [380 mL] 380 mL PEEP:  [8 cmH20] 8 cmH20 Pressure Support:  [10 cmH20] 10 cmH20 Plateau Pressure:  [21 cmH20-26 cmH20] 26 cmH20   Intake/Output Summary (Last 24 hours) at 12/02/2021 0825 Last data filed at 12/02/2021 0700 Gross per 24 hour  Intake 2222.39 ml  Output 2310 ml  Net -87.61 ml    Filed Weights   11/28/21 0220 11/29/21 0439 12/01/21 0434  Weight: (!) 190.5 kg (!) 191 kg (!) 190 kg    Examination: No distress Trach in place with thick white secretions Has white dried material on mouth but uniform, does not look like thrush Abdomen soft Shallow inspiratory efforts on PS Ext warm questionable edema  CXR looks wet  Assessment & Plan:   Acute respiratory failure with hypoxia and hypercapnia (HCC) Untreated OHS/OSA - LTVV, VAP bundle - trach placed 5/19 6.0 shiley  - continue low dose clonazepam plus standing oxycodone; fent for breakthrough  History of pulmonary embolus (PE) - heparin gtt for now  - once cortrak in place   Acute on chronic heart failure with preserved ejection fraction (HFpEF) (HCC) - push diuresis today with lasix/diamox - K/Mg/Cr checks daily  Hyperlipidemia -home atorvastatin  Hypokalemia - replace and repeat BMET  Type 2 DM- sugars ok, on SSI  Dysphagia- cortrak to be replaced this am, resume TF at that time  Best Practice (right click and "Reselect all SmartList Selections" daily)   Diet/type: NPO, tube feeds DVT prophylaxis: systemic heparin GI prophylaxis: N/A Lines: PICC Foley:  foley in place, will discuss what we can do to work towards removal at some point Code Status:  full code  Last date of multidisciplinary goals of care discussion [Updated's son 5/18 over the phone and consented for tracheostomy and bronchoscopy assistance.] TOC for LTACH  Patient critically ill  due to respiratory failure Interventions to address this today vent and sedation titration Risk of deterioration without these interventions is high  I personally spent 31 minutes providing critical care not including any separately billable procedures  Angel Emery MD [facility]  Prefer epic messenger for cross cover needs If after hours, please call E-link

## 2021-12-02 NOTE — Procedures (Signed)
Cortrak  Person Inserting Tube:  Alroy Dust, Shaina Gullatt L, RD Tube Type:  Cortrak - 43 inches Tube Location:  Right nare Secured by: Bridle Technique Used to Measure Tube Placement:  Marking at nare/corner of mouth Cortrak Secured At:  60 cm  Cortrak Tube Team Note:  Consult received to place a Cortrak feeding tube.   X-ray is required, abdominal x-ray has been ordered by the Cortrak team. Please confirm tube placement before using the Cortrak tube.   If the tube becomes dislodged please keep the tube and contact the Cortrak team at [email or web address] ([email or web address]) for replacement.  If after hours and replacement cannot be delayed, place a NG tube and confirm placement with an abdominal x-ray.    Hermina Barters RD, LDN Clinical Dietitian See Shea Evans for contact information.

## 2021-12-02 NOTE — Progress Notes (Signed)
This RN walked into room due to patient desatting, additional O2 breaths given. RT flipped patient to full support. This RN noticed that during coretrak placement frank blood was coming out of patients tracheostomy and mouth/nose. This RN suctioned the tubing and mouth, notified RT, MD and Pharmacy.  MD gave verbal order to pause heparin until new coretrak can be placed.  Due to patient not having enteral access medications administration is delayed at this time. Pharmacy and MD are made aware.   -Tawana Scale, RN

## 2021-12-02 NOTE — TOC Initial Note (Signed)
Transition of Care [facility]) - Initial/Assessment Note    Patient Details  Name: Angel Brewer MRN: [id number] Date of Birth: 1966/04/23  Transition of Care [facility] CM/SW Contact:    Milas Gain, [place] Phone Number: 12/02/2021, 2:47 PM  Clinical Narrative:                   CSW not candiate for Pt recommendation for LTAC.CSW spoke with patient at bedside and with patients permission patients son Tyrell regarding dc plan. Patient and Tyrell are hopeful that patient can eventually return back home. Patient and son are open to SNF as back up plan but 1st choice would be for patient to return back home. Patients son reports patient comes from home alone. Patients son reports he will work on speaking with family about having someone be at home with patient 24/7 to provide supervision. CSW informed patients son that for back up plan patient would need to be weaned down on trach collar to 28%  uncuffed as well as no cortrak before faxing patient out for SNF. CSW informed patients son that CM will follow up in regards to home needs. Patients son thanked CSW. CSW informed CM. CSW will continue to follow and assist with patients dc planning needs.  Expected Discharge Plan:  (Home) Barriers to Discharge: Continued Medical Work up   Patient Goals and CMS Choice   CMS Medicare.gov Compare Post Acute Care list provided to:: Patient Represenative (must comment) (Patients son Tyrell) Choice offered to / list presented to : Adult Children (Tyrell)  Expected Discharge Plan and Services Expected Discharge Plan:  (Home) In-house Referral: Clinical Social Work     Living arrangements for the past 2 months: Single Family Home                                      Prior Living Arrangements/Services Living arrangements for the past 2 months: Single Family Home Lives with:: Self Patient language and need for interpreter reviewed:: Yes Do you feel safe going back to the place where you live?: Yes       Need for Family Participation in Patient Care: Yes (Comment) Care giver support system in place?: Yes (comment)   Criminal Activity/Legal Involvement Pertinent to Current Situation/Hospitalization: No - Comment as needed  Activities of Daily Living Home Assistive Devices/Equipment: Dentures (specify type), CPAP (Upper & lower dentures) ADL Screening (condition at time of admission) Patient's cognitive ability adequate to safely complete daily activities?: Yes Is the patient deaf or have difficulty hearing?: No Does the patient have difficulty seeing, even when wearing glasses/contacts?: No Does the patient have difficulty concentrating, remembering, or making decisions?: No Patient able to express need for assistance with ADLs?: Yes Does the patient have difficulty dressing or bathing?: No Independently performs ADLs?: Yes (appropriate for developmental age) Does the patient have difficulty walking or climbing stairs?: Yes Weakness of Legs: Both Weakness of Arms/Hands: None  Permission Sought/Granted Permission sought to share information with : Case Manager, Family Supports, Customer service manager Permission granted to share information with : No  Share Information with NAME: Due to current orientation CSW spoke with patients son Tyrell     Permission granted to share info w Relationship: son  Permission granted to share info w Contact Information: Tyrell [phone]  Emotional Assessment       Orientation: :  (intubated/trach) Alcohol / Substance Use: Not Applicable Psych Involvement: No (comment)  Admission diagnosis:  Morbid obesity ([place]) [E66.01] Pulmonary hypertension ([place]) [I27.20] Acute respiratory failure with hypoxia ([place]) [J96.01] Acute on chronic congestive heart failure, unspecified heart failure type ([place]) [I50.9] Acute on chronic heart failure with preserved ejection fraction (HFpEF) ([place]) [I50.33] Patient Active Problem List   Diagnosis Date Noted    Acute metabolic encephalopathy 01/49/9692   Acute on chronic heart failure with preserved ejection fraction (HFpEF) ([place]) 11/21/2021   History of pulmonary embolus (PE) 11/21/2021   Hyperlipidemia 11/21/2021   COPD (chronic obstructive pulmonary disease) ([place]) 05/08/2021   Obesity hypoventilation syndrome ([place]) 05/03/2021   Acute respiratory failure with hypoxia and hypercapnia ([place]) 04/30/2021   OSA (obstructive sleep apnea) 08/18/2020   Chronic deep vein thrombosis (DVT) of femoral vein of left lower extremity ([place]) 02/09/2020   Chronic anticoagulation 02/09/2020   Right wrist fracture 06/14/2019   Chronic respiratory failure with hypoxia ([place]) 06/13/2019   Essential hypertension 08/25/2017   Morbid obesity ([place]) 10/29/2016   Vitamin D deficiency 10/29/2016   PCP:  [name], NP Pharmacy:   [id number] [place] [id number]: [phone]: [phone]    Social Determinants of Health (SDOH) Interventions    Readmission Risk Interventions    12/02/2019    1:47 PM  Readmission Risk Prevention Plan  Post Dischage Appt Complete  Medication Screening Complete  Transportation Screening Complete

## 2021-12-02 NOTE — Progress Notes (Addendum)
ANTICOAGULATION CONSULT NOTE  Pharmacy Consult for heparin Indication: pulmonary embolus  No Known Allergies  Patient Measurements: Height: 5' 8" (172.7 cm) Weight: (!) 190 kg (418 lb 14 oz) IBW/kg (Calculated) : 63.9 Heparin Dosing Weight: 114kg  Vital Signs: Temp: 99.2 F (37.3 C) (05/22 0820) Temp Source: Oral (05/22 0820) BP: 129/49 (05/22 0820) Pulse Rate: 75 (05/22 0820)  Labs: Recent Labs    11/30/21 0452 12/01/21 0425 12/02/21 0400  HGB 11.3* 11.0* 11.1*  HCT 39.8 36.8 37.9  PLT 222 214 227  HEPARINUNFRC 0.35 0.40 0.53  CREATININE 0.97 0.78 0.80     Estimated Creatinine Clearance: 141.7 mL/min (by C-G formula based on SCr of 0.8 mg/dL).   Medical History: Past Medical History:  Diagnosis Date   DVT (deep vein thrombosis) in pregnancy    RLE DVT 02/2016   Dyspnea    Morbid obesity (HCC)    PE (pulmonary embolism) 02/29/2016   Sleep apnea    uses a cpap   Medications:  Infusions:   sodium chloride Stopped (11/25/21 1243)   feeding supplement (VITAL AF 1.2 CAL) Stopped (12/01/21 2315)   heparin 2,200 Units/hr (12/02/21 0700)    Assessment: 56 yo female on PTA Eliquis for her hx of PE. Transferred to ICU for intubation. Her last dose was 5/13 in AM. Pharmacy to dose heparin.   Heparin drip 2200 units/hr with heparin level 0.53 at goal. No issues noted. CBC continues to be within normal limits and stable.   Heparin held for a few hours this morning for bleeding after attempted cortrak placement.  Bleeding resolving, and heparin resumed ~ 1445 PM.  Goal of Therapy:  Heparin level 0.3-0.7 units/ml Monitor platelets by anticoagulation protocol: Yes   Plan:  Continue heparin 2200 units/hr Daily heparin level and CBC Monitor for s/sx of bleeding F/u replacement of enteral access - switch back to DOAC as able.  [place] Crane, Roylene Reason, BCCP Clinical Pharmacist  12/02/2021 8:40 AM   [facility] pharmacy phone numbers are listed on [email or web address]

## 2021-12-02 NOTE — Progress Notes (Signed)
Patient developed frequent coughing. Suctioned multiple times and removed thick dark tan secretions. Notified respiratory he was able to lavage thick secretions out. Patient complained of mild nausea. Tube feed paused for 30 minutes, pt continued coughing spells. Zofran retrieved for patient, when entering back into room found vomit and NGT on patients chest. Patient denies swallowing or inhaling any fluid/vomit. Patient stabl, vitals stable, sats 94% on FiO2 40% (unchanged). Patient states she is able to breath fine just coughing. Tube feeds turned off, administered zofran, and notified CCM. Erling Conte, RN

## 2021-12-02 NOTE — Care Management (Signed)
12-02-21 1003 Case Manager received a consult for Long Term Acute Care (LTAC). Patient is not a candidate for LTAC due to [employer]. Medicaid does not cover benefit for LTAC. CSW to follow for additional needs.

## 2021-12-02 NOTE — Progress Notes (Signed)
SLP Cancellation Note  Patient Details Name: Angel Brewer MRN: [id number] DOB: Jan 13, 1966   Cancelled treatment:       Reason Eval/Treat Not Completed: Patient not medically ready. Weaning today. Will f/u.    Shaili Donalson, Katherene Ponto 12/02/2021, 1:27 PM

## 2021-12-02 NOTE — Progress Notes (Signed)
eLink Physician-Brief Progress Note Patient Name: Angel Brewer DOB: 1965/11/26 MRN: [id number]   Date of Service  12/02/2021  HPI/Events of Note  Patient coughed up her NG tube and is now NPO until it can be replaced in AM.  eICU Interventions  D 5 % LR gtt ordered for 10 hours at 50 ml / hour. PRN iv Fentanyl ordered for pain, agitation, and cough amelioration.        Angel Brewer 12/02/2021, 12:40 AM

## 2021-12-02 NOTE — Progress Notes (Signed)
Heart Failure Stewardship Pharmacist Progress Note   PCP: [name], NP PCP-Cardiologist: None    HPI:  56 yo female with PMH of OSA on CPAP, diastolic CHF, HLD, and DVT 2/2 pregnancy 2017, and pulmonary HTN. Presented to ED with progressive dyspnea at rest and on exertion, orthopnea with LEE on exam. CXR with moderate interstitial lung markings indicative of moderate/severe CHF and pulmonary edema. Echo 05/12 with LVEF 60-65%, mild LVH, normal RV - unchanged since 04/2021 Echo. Adherent to PTA furosemide. Started on IV diuretics. Became less responsive and developed respiratory acidosis despite being on bipap and was then intubated on 5/13. Ongoing discussions for extubation vs trach - failed SBT 05/16, 05/17, 05/18. Trach on 5/19.  Current HF Medications: Diuretic: furosemide 60 mg IV q8h; acetazolamide 500 mg IV q12h x 2   Prior to admission HF Medications: Diuretic: furosemide 40 mg daily  Pertinent Lab Values: Serum creatinine 0.80 (baseline <1), BUN 30, Potassium 4.4, Sodium 139, BNP 139.2, Magnesium 2.3, CO2 41  Vital Signs: Weight: 418 lbs (admission weight: 433.4 lbs)  05/07/2021 discharge weight: 401 lb Blood pressure: 130s/70s Heart rate: 50-60s I/O: -2.1L yesterday; net -6.3L  Medication Assistance / Insurance Benefits Check: Does the patient have prescription insurance?  Yes Type of insurance plan: Managed Medicaid - Entresto/SGLT2i require [place]  Outpatient Pharmacy:  [facility] Is the patient willing to use [facility] at discharge? Pending Is the patient willing to transition their outpatient pharmacy to utilize a [facility] outpatient pharmacy?   Pending    Assessment: 1. Acute on chronic diastolic CHF (LVEF 97-98%). NYHA class IV symptoms. Continued good response to IV diuresis. - Continue furosemide IV 60 mg q8h + diamox 500 mg IV q12h x 2 - Off amlodipine - Further GDMT optimization limited with trach/cortrak    Plan: 1) Medication changes recommended at this time: - Continue current regimen  2) Patient assistance: - None pending  3)  Education  - To be completed prior to discharge  Kerby Nora, PharmD, BCPS Heart Failure Stewardship Pharmacist Phone [phone]  Please check [email or web address] for unit-specific pharmacist phone numbers

## 2021-12-02 NOTE — Progress Notes (Signed)
Cortrak Tube Team Note:  Consult received to place a Cortrak feeding tube. During placement pt began to bleed from nose. This RD immediatly removed tube from nose and bleeding stopped. Pt also with blood in trach and mouth, RN suctioned trach and mouth. RN paused heparin and reached out to Pharmacy and MD.  This RD reached out to MD, plan to hold heparin and attempt placement of Cortrak later.     Hermina Barters RD, LDN Clinical Dietitian See Shea Evans for contact information.

## 2021-12-03 DIAGNOSIS — I5033 Acute on chronic diastolic (congestive) heart failure: Secondary | ICD-10-CM | POA: Diagnosis not present

## 2021-12-03 LAB — GLUCOSE, CAPILLARY
Glucose-Capillary: 102 mg/dL — ABNORMAL HIGH (ref 70–99)
Glucose-Capillary: 103 mg/dL — ABNORMAL HIGH (ref 70–99)
Glucose-Capillary: 117 mg/dL — ABNORMAL HIGH (ref 70–99)
Glucose-Capillary: 121 mg/dL — ABNORMAL HIGH (ref 70–99)
Glucose-Capillary: 128 mg/dL — ABNORMAL HIGH (ref 70–99)
Glucose-Capillary: 92 mg/dL (ref 70–99)
Glucose-Capillary: 95 mg/dL (ref 70–99)

## 2021-12-03 LAB — CBC
HCT: 37.3 % (ref 36.0–46.0)
Hemoglobin: 11.1 g/dL — ABNORMAL LOW (ref 12.0–15.0)
MCH: 28.8 pg (ref 26.0–34.0)
MCHC: 29.8 g/dL — ABNORMAL LOW (ref 30.0–36.0)
MCV: 96.9 fL (ref 80.0–100.0)
Platelets: 269 10*3/uL (ref 150–400)
RBC: 3.85 MIL/uL — ABNORMAL LOW (ref 3.87–5.11)
RDW: 14.2 % (ref 11.5–15.5)
WBC: 6.3 10*3/uL (ref 4.0–10.5)
nRBC: 0.3 % — ABNORMAL HIGH (ref 0.0–0.2)

## 2021-12-03 LAB — BASIC METABOLIC PANEL
Anion gap: 10 (ref 5–15)
Anion gap: 10 (ref 5–15)
BUN: 38 mg/dL — ABNORMAL HIGH (ref 6–20)
BUN: 41 mg/dL — ABNORMAL HIGH (ref 6–20)
CO2: 40 mmol/L — ABNORMAL HIGH (ref 22–32)
CO2: 40 mmol/L — ABNORMAL HIGH (ref 22–32)
Calcium: 9.9 mg/dL (ref 8.9–10.3)
Calcium: 9.7 mg/dL (ref 8.9–10.3)
Chloride: 89 mmol/L — ABNORMAL LOW (ref 98–111)
Chloride: 87 mmol/L — ABNORMAL LOW (ref 98–111)
Creatinine, Ser: 0.93 mg/dL (ref 0.44–1.00)
Creatinine, Ser: 0.97 mg/dL (ref 0.44–1.00)
GFR, Estimated: >60 mL/min (ref >60)
GFR, Estimated: >60 mL/min (ref >60)
Glucose, Bld: 95 mg/dL (ref 70–99)
Glucose, Bld: 104 mg/dL — ABNORMAL HIGH (ref 70–99)
Potassium: 4 mmol/L (ref 3.5–5.1)
Potassium: 3.6 mmol/L (ref 3.5–5.1)
Sodium: 139 mmol/L (ref 135–145)
Sodium: 137 mmol/L (ref 135–145)

## 2021-12-03 LAB — MAGNESIUM: Magnesium: 2.3 mg/dL (ref 1.7–2.4)

## 2021-12-03 LAB — HEPARIN LEVEL (UNFRACTIONATED)
Heparin Unfractionated: >1.1 IU/mL — ABNORMAL HIGH (ref 0.30–0.70)
Heparin Unfractionated: 0.53 IU/mL (ref 0.30–0.70)

## 2021-12-03 LAB — PHOSPHORUS: Phosphorus: 5.7 mg/dL — ABNORMAL HIGH (ref 2.5–4.6)

## 2021-12-03 MED ORDER — APIXABAN 5 MG PO TABS
5.0000 mg | ORAL_TABLET | Freq: Two times a day (BID) | ORAL | Status: DC
  Administered 2021-12-03 – 2021-12-11 (×17): 5 mg
  Filled 2021-12-03 (×17): qty 1

## 2021-12-03 MED ORDER — CLONAZEPAM 0.25 MG PO TBDP
0.2500 mg | ORAL_TABLET | Freq: Two times a day (BID) | ORAL | Status: DC
  Administered 2021-12-03 – 2021-12-11 (×17): 0.25 mg
  Filled 2021-12-03 (×17): qty 1

## 2021-12-03 MED ORDER — ACETAZOLAMIDE 250 MG PO TABS
500.0000 mg | ORAL_TABLET | Freq: Two times a day (BID) | ORAL | Status: DC
  Administered 2021-12-03 (×2): 500 mg
  Filled 2021-12-03 (×2): qty 2

## 2021-12-03 MED ORDER — METOLAZONE 2.5 MG PO TABS
2.5000 mg | ORAL_TABLET | Freq: Once | ORAL | Status: AC
  Administered 2021-12-03: 2.5 mg via ORAL
  Filled 2021-12-03: qty 1

## 2021-12-03 MED ORDER — POTASSIUM CHLORIDE 20 MEQ PO PACK
40.0000 meq | PACK | Freq: Two times a day (BID) | ORAL | Status: AC
  Administered 2021-12-03 – 2021-12-04 (×2): 40 meq
  Filled 2021-12-03 (×2): qty 2

## 2021-12-03 NOTE — Evaluation (Signed)
Clinical/Bedside Swallow Evaluation Patient Details  Name: Angel Brewer MRN: [id number] Date of Birth: [birth year]  Today's Date: 12/03/2021 Time: SLP Start Time (ACUTE ONLY): 1545 SLP Stop Time (ACUTE ONLY): 1555 SLP Time Calculation (min) (ACUTE ONLY): 10 min  Past Medical History:  Past Medical History:  Diagnosis Date   DVT (deep vein thrombosis) in pregnancy    RLE DVT 02/2016   Dyspnea    Morbid obesity (HCC)    PE (pulmonary embolism) 02/29/2016   Sleep apnea    uses a cpap   Past Surgical History:  Past Surgical History:  Procedure Laterality Date   CESAREAN SECTION     x 3   OPEN REDUCTION INTERNAL FIXATION (ORIF) DISTAL RADIAL FRACTURE Right 06/20/2019   Procedure: OPEN REDUCTION INTERNAL FIXATION (ORIF) DISTAL RADIAL FRACTURE;  Surgeon: [name], MD;  Location: [facility];  Service: Orthopedics;  Laterality: Right;  REGIONAL BLOCK TOTAL SURGERY TIME: 90 MINUTES   SYNOVECTOMY Right 11/21/2019   Procedure: RIGHT WRIST LIGAMENT RECONSTRUCTION;  Surgeon: [name], MD;  Location: [facility];  Service: Orthopedics;  Laterality: Right;  PROCEDURE: RIGHT WRIST LIGAMENT RECONSTRUCTION LENGTH OF SURGERY: 105 MIN   HPI:  Angel Brewer is a 56 y/o female who presented on 5/11 with SOB, admitted with acute on chronic heart failure. Had to be tx to ICU secondary to hypercarbic respiratory failure with increased obtundation, intubated 5/15, s/p trach 11/29/21, weaned from vent and tolerating TC trials 5/23. PMH includes HTN, recurrent PE on Eliquis, morbid obesity, DVT, radial ORIF and wrist ligament reconstruction.    Assessment / Plan / Recommendation  Clinical Impression  Pt participated in preliminary clinical swallowing assessment to determine readiness for instrumental swallowing study.  PMV was still in place. Oral care provided. HOB elevated.  There were no focal deficits; oral mechanism exam normal. Pt is edentulous. She accepted ice chips with normal oral  mechanics/mastication and initiated a palpable swallow. She is ready for FEES - will plan to complete in the next 24-48 hours. D/W pt, RN, and her sister at bedside. SLP Visit Diagnosis: Dysphagia, unspecified (R13.10)    Aspiration Risk    tba   Diet Recommendation   NPO -  may have 2-3 ice chips after oral care and with PMV in place       Other  Recommendations Oral Care Recommendations: Oral care QID    Recommendations for follow up therapy are one component of a multi-disciplinary discharge planning process, led by the attending physician.  Recommendations may be updated based on patient status, additional functional criteria and insurance authorization.  Follow up Recommendations Skilled nursing-short term rehab (<3 hours/day)      Assistance Recommended at Discharge Frequent or constant Supervision/Assistance  Functional Status Assessment Patient has had a recent decline in their functional status and demonstrates the ability to make significant improvements in function in a reasonable and predictable amount of time.  Frequency and Duration min 2x/week                  Swallow Study   General Date of Onset: 11/21/21 HPI: Angel Brewer is a 56 y/o female who presented on 5/11 with SOB, admitted with acute on chronic heart failure. Had to be tx to ICU secondary to hypercarbic respiratory failure with increased obtundation, intubated 5/15, s/p trach 11/29/21, weaned from vent and tolerating TC trials 5/23. PMH includes HTN, recurrent PE on Eliquis, morbid obesity, DVT, radial ORIF and wrist ligament reconstruction. Type of Study: Bedside Swallow Evaluation Diet Prior  to this Study: NPO;NG Tube Temperature Spikes Noted: No Respiratory Status: Trach;Trach Collar Trach Size and Type: Cuff;#6;Deflated;With PMSV in place History of Recent Intubation: Yes Length of Intubations (days): 4 days (then trached) Behavior/Cognition: Alert;Cooperative Oral Cavity Assessment: Within  Functional Limits Oral Care Completed by SLP: Yes Oral Cavity - Dentition: Edentulous Self-Feeding Abilities: Total assist Patient Positioning: Partially reclined Baseline Vocal Quality: Low vocal intensity Volitional Cough: Strong Volitional Swallow: Able to elicit    Oral/Motor/Sensory Function Overall Oral Motor/Sensory Function: Within functional limits   Ice Chips Ice chips: Within functional limits   Thin Liquid Thin Liquid: Not tested    Nectar Thick Nectar Thick Liquid: Not tested             Angel Brewer, [place] Office number [phone] Pager [phone]         Angel Brewer 12/03/2021,4:16 PM

## 2021-12-03 NOTE — Progress Notes (Signed)
Physical Therapy Treatment Patient Details Name: Angel Brewer MRN: [id number] DOB: 1965/11/02 Today's Date: 12/03/2021   History of Present Illness Patient is a 56 y/o female who presents on 5/11 with SOB, admitted with acute on chronic heart failure. Had to be tx to ICU secondary to hypercarbic respiratory failure with increased obtundation, intubated 5/15. s/p trach 11/29/21. PMH includes HTN, recurrent PE on Eliquis, morbid obesity, DVT, radial ORIF and wrist ligament reconstruction.    PT Comments    Patient progressing slowly towards PT goals. Session focused on bed mobility, exercise and sitting EOB for upright positioning/core activation requiring assist of 2-4 to help manage lines/patient. Pt minimally able to initiate movement of LEs and trunk. Worked on activating core/trunk musculature once EOB, trunk rotation/mobility and pec stretch. Pt with pain through trach site. Noted to have secretions during mobility. Sp02 dropped to high 80s on trach via vent PEEP 8 Fi02 100% (normally at 50%) and RR up to 40s. Recommend Kreg tilt bed to improve upright tolerance, ventilation and WB activities to help with weaning, positioning and overall mobility/strengthening.  Will follow.   Recommendations for follow up therapy are one component of a multi-disciplinary discharge planning process, led by the attending physician.  Recommendations may be updated based on patient status, additional functional criteria and insurance authorization.  Follow Up Recommendations  Skilled nursing-short term rehab (<3 hours/day)     Assistance Recommended at Discharge Frequent or constant Supervision/Assistance  Patient can return home with the following Two people to help with walking and/or transfers;Two people to help with bathing/dressing/bathroom;Help with stairs or ramp for entrance;Assist for transportation;Direct supervision/assist for financial management;Assistance with Paramedic (measurements PT);Wheelchair cushion (measurements PT);Hospital bed (lift pending progress)    Recommendations for Other Services       Precautions / Restrictions Precautions Precautions: Other (comment) Precaution Comments: trach, rectal tube, prevalon boots, watch 02. NG tube Restrictions Weight Bearing Restrictions: No     Mobility  Bed Mobility Overal bed mobility: Needs Assistance Bed Mobility: Supine to Sit, Sit to Supine, Rolling Rolling: Total assist, +2 for physical assistance (3-4)   Supine to sit: Total assist, +2 for physical assistance (+3) Sit to supine: Total assist, +2 for physical assistance (+3)   General bed mobility comments: Able to initiate movement with LEs and LUE to reach for therapist/rail but needs assist with BLEs, trunk and scooting bottom to EOB with 2-3 people to manage pt and lines/vent.    Transfers                   General transfer comment: Deferred    Ambulation/Gait               General Gait Details: Unable   Stairs             Wheelchair Mobility    Modified Rankin (Stroke Patients Only)       Balance Overall balance assessment: Needs assistance Sitting-balance support: Feet unsupported, Bilateral upper extremity supported Sitting balance-[name]: Zero Sitting balance - Comments: total A for sitting EOB, worked on activating core musculature by pulling self forward away from back support x5 with slow descent back onto surface, trunk rotation to right/left. Postural control: Posterior lean                                  Cognition Arousal/Alertness: Awake/alert Behavior During Therapy: WFL for tasks assessed/performed  Overall Cognitive Status: Difficult to assess                                 General Comments: Nods to answer questions and tries to mouth some. Follows commands.        Exercises General Exercises - Lower Extremity Long  Arc Quad: Strengthening, Both, 10 reps, Seated Hip Flexion/Marching: Strengthening, Both, 10 reps, Seated Other Exercises Other Exercises: passively stretching pecs    General Comments General comments (skin integrity, edema, etc.): Pt on trach vio vent PEEP 8, Fi02 50%, increased to 100% during session. Sp02 dropped to high 80s during mobility. + secretions. HR and BP stable.      Pertinent Vitals/Pain Pain Assessment Pain Assessment: Faces Faces Pain Scale: Hurts even more Pain Location: trach site Pain Descriptors / Indicators: Sore, Grimacing, Discomfort Pain Intervention(s): Monitored during session, Repositioned    Home Living                          Prior Function            PT Goals (current goals can now be found in the care plan section) Progress towards PT goals: Progressing toward goals    Frequency    Min 2X/week      PT Plan Discharge plan needs to be updated    Co-evaluation PT/OT/SLP Co-Evaluation/Treatment: Yes Reason for Co-Treatment: Complexity of the patient's impairments (multi-system involvement);For patient/therapist safety;To address functional/ADL transfers (morbid obesity) PT goals addressed during session: Mobility/safety with mobility;Balance;Strengthening/ROM        AM-PAC PT "6 Clicks" Mobility   Outcome Measure  Help needed turning from your back to your side while in a flat bed without using bedrails?: Total Help needed moving from lying on your back to sitting on the side of a flat bed without using bedrails?: Total Help needed moving to and from a bed to a chair (including a wheelchair)?: Total Help needed standing up from a chair using your arms (e.g., wheelchair or bedside chair)?: Total Help needed to walk in hospital room?: Total Help needed climbing 3-5 steps with a railing? : Total 6 Click Score: 6    End of Session Equipment Utilized During Treatment: Oxygen (trach via vent) Activity Tolerance: Patient  limited by pain;Patient limited by fatigue;Patient tolerated treatment well Patient left: in bed;with call bell/phone within reach;with nursing/sitter in room Nurse Communication: Mobility status;Need for lift equipment (tilt bed) PT Visit Diagnosis: Muscle weakness (generalized) (M62.81);Pain Pain - part of body:  (trach)     Time: 8718-4108 PT Time Calculation (min) (ACUTE ONLY): 32 min  Charges:  $Therapeutic Activity: 8-22 mins                     Marisa Severin, PT, DPT Acute Rehabilitation Services Secure chat preferred Office [place] 12/03/2021, 12:43 PM

## 2021-12-03 NOTE — Progress Notes (Signed)
Heart Failure Stewardship Pharmacist Progress Note   PCP: [name], NP PCP-Cardiologist: None    HPI:  55 yo female with PMH of OSA on CPAP, diastolic CHF, HLD, and DVT 2/2 pregnancy 2017, and pulmonary HTN.  Presented to ED with progressive dyspnea at rest and on exertion, orthopnea with LEE on exam.  CXR with moderate interstitial lung markings indicative of moderate/severe CHF and pulmonary edema.  Echo 05/12 with LVEF 60-65%, mild LVH, normal RV - unchanged since 04/2021 Echo.  Adherent to PTA furosemide.  Started on IV diuretics.  Became less responsive and developed respiratory acidosis despite being on bipap and was then intubated on 5/13.  Ongoing discussions for extubation vs trach - failed SBT 05/16, 05/17, 05/18. Trach placed 5/19.   Current HF Medications: Diuretic: furosemide 60 mg IV q8h; acetazolamide 500 mg PT q12h (05/23 >> )  Prior to admission HF Medications: Diuretic: furosemide 40 mg daily  Pertinent Lab Values: Serum creatinine 0.93 (baseline <1), BUN 38 (up), Potassium 4, Sodium 139, BNP 139.2, Magnesium 2.3, CO2 40  Vital Signs: Weight: 371 lbs - 47lb drop, likely not accurate (admission weight: 433.4 lbs)  05/07/2021 discharge weight: 401 lb Blood pressure: labile - 100-30s/70s Heart rate: 50-60s I/O: -2.39 L yesterday; net +275 cc  Medication Assistance / Insurance Benefits Check: Does the patient have prescription insurance?  Yes Type of insurance plan: Managed Medicaid - Entresto/SGLT2i require [place]  Outpatient Pharmacy:  [facility] Is the patient willing to use [facility] at discharge? Pending Is the patient willing to transition their outpatient pharmacy to utilize a [facility] outpatient pharmacy?   Pending    Assessment: 1. Acute on chronic diastolic CHF (LVEF 92-44%). NYHA class IV symptoms. Continued good response to IV diuresis. - Continue furosemide IV 60 mg q8h + diamox 500 mg PO q12h. Monitor for  hypokalemia, tolerance, metabolic acidosis.  - Off amlodipine - Further GDMT optimization limited with trach/cortrak   Plan: 1) Medication changes recommended at this time: - Continue current regimen - Consider adding metolazone 2.5 mg x1 for additional diuresis  2) Patient assistance: - None pending  3)  Education  - To be completed prior to discharge  Laurey Arrow, PharmD PGY1 Pharmacy Resident 12/03/2021  8:40 AM  Please check [email or web address] for unit-specific pharmacy phone numbers.

## 2021-12-03 NOTE — Progress Notes (Signed)
Occupational Therapy Treatment Patient Details Name: Angel Brewer MRN: [id number] DOB: Dec 31, 1965 Today's Date: 12/03/2021   History of present illness Patient is a 56 y/o female who presents on 5/11 with SOB, admitted with acute on chronic heart failure. Had to be tx to ICU secondary to hypercarbic respiratory failure with increased obtundation, intubated 5/15. s/p trach 11/29/21. PMH includes HTN, recurrent PE on Eliquis, morbid obesity, DVT, radial ORIF and wrist ligament reconstruction.   OT comments  Pt progressing towards OT goals this session. Focused on bed mobility, and activity tolerance sitting EOB in preparation for participation in seated grooming/ADL tasks. Pt max A +2-4 for bed mobility. Once seated EOB she was able to participate in multiple exercises and engaging core. Sp02 dropped to high 80s on trach via vent PEEP 8 Fi02 100% (normally at 50%) and RR up to 40s. Trach site painful for patient - but giving really good effort and participation. Recommend Kreg Tilt bed to facilitate safe WB positioning and activity tolerance for ADL. OT will continue to follow acutely. Next session bring theraband and establish bed level HEP   Recommendations for follow up therapy are one component of a multi-disciplinary discharge planning process, led by the attending physician.  Recommendations may be updated based on patient status, additional functional criteria and insurance authorization.    Follow Up Recommendations  Skilled nursing-short term rehab (<3 hours/day) ([facility] preffered)    Assistance Recommended at Discharge Frequent or constant Supervision/Assistance  Patient can return home with the following      Equipment Recommendations  Other (comment) (defer at this time)    Recommendations for Other Services PT consult;Speech consult    Precautions / Restrictions Precautions Precautions: Other (comment) Precaution Comments: trach, rectal tube, prevalon boots, watch 02. NG  tube Restrictions Weight Bearing Restrictions: No       Mobility Bed Mobility Overal bed mobility: Needs Assistance Bed Mobility: Supine to Sit, Sit to Supine, Rolling Rolling: Total assist, +2 for physical assistance (3-4)   Supine to sit: Total assist, +2 for physical assistance (+3) Sit to supine: Total assist, +2 for physical assistance (+3)   General bed mobility comments: Able to initiate movement with LEs and LUE to reach for therapist/rail but needs assist with BLEs, trunk and scooting bottom to EOB with 2-3 people to manage pt and lines/vent.    Transfers                   General transfer comment: Deferred     Balance Overall balance assessment: Needs assistance Sitting-balance support: Feet unsupported, Bilateral upper extremity supported Sitting balance-[name]: Zero Sitting balance - Comments: total A for sitting EOB, worked on activating core musculature by pulling self forward away from back support x5 with slow descent back onto surface, trunk rotation to right/left. Postural control: Posterior lean                                 ADL either performed or assessed with clinical judgement   ADL Overall ADL's : Needs assistance/impaired Eating/Feeding: NPO   Grooming: Wash/dry face;Set up;Bed level (HOB elevated)                       Toileting- Clothing Manipulation and Hygiene: Total assistance;Bed level         General ADL Comments: focused on bed mobility, activity tolerance sitting EOB,    Extremity/Trunk Assessment  Vision       Perception     Praxis      Cognition Arousal/Alertness: Awake/alert Behavior During Therapy: WFL for tasks assessed/performed Overall Cognitive Status: Difficult to assess                                 General Comments: Nods to answer questions and tries to mouth some. Follows commands.        Exercises Other Exercises Other Exercises:  passively stretching pecs    Shoulder Instructions       General Comments Pt on trach vio vent PEEP 8, Fi02 50%, increased to 100% during session. Sp02 dropped to high 80s during mobility. + secretions. HR and BP stable.    Pertinent Vitals/ Pain       Pain Assessment Pain Assessment: Faces Faces Pain Scale: Hurts even more Pain Location: trach site Pain Descriptors / Indicators: Sore, Grimacing, Discomfort Pain Intervention(s): Monitored during session, Repositioned  Home Living                                          Prior Functioning/Environment              Frequency  Min 2X/week        Progress Toward Goals  OT Goals(current goals can now be found in the care plan section)  Progress towards OT goals: Progressing toward goals  Acute Rehab OT Goals Patient Stated Goal: get stronger OT Goal Formulation: With patient Time For Goal Achievement: 12/14/21 Potential to Achieve Goals: Good ADL Goals Pt Will Perform Grooming: with set-up;bed level Pt Will Perform Upper Body Bathing: with adaptive equipment;sitting;with mod assist Pt Will Perform Lower Body Bathing: with mod assist;sitting/lateral leans;with adaptive equipment Pt Will Transfer to Toilet: with max assist;with +2 assist;squat pivot transfer;bedside commode Pt Will Perform Toileting - Clothing Manipulation and hygiene: with max assist Pt/caregiver will Perform Home Exercise Program: Both right and left upper extremity;Increased strength;With theraband;With written HEP provided Additional ADL Goal #1: Pt will perform bed mobility to come EOB as precursor to participation in ADL at [place] Discharge plan needs to be updated (due to insurance)    Co-evaluation    PT/OT/SLP Co-Evaluation/Treatment: Yes Reason for Co-Treatment: Complexity of the patient's impairments (multi-system involvement);For patient/therapist safety;To address functional/ADL transfers (morbid obesity) PT  goals addressed during session: Mobility/safety with mobility;Balance;Strengthening/ROM        AM-PAC OT "6 Clicks" Daily Activity     Outcome Measure   Help from another person eating meals?: Total (NPO) Help from another person taking care of personal grooming?: A Lien Help from another person toileting, which includes using toliet, bedpan, or urinal?: Total Help from another person bathing (including washing, rinsing, drying)?: A Lot Help from another person to put on and taking off regular upper body clothing?: A Lot Help from another person to put on and taking off regular lower body clothing?: Total 6 Click Score: 10    End of Session Equipment Utilized During Treatment: Oxygen (trach/vent support)  OT Visit Diagnosis: Other abnormalities of gait and mobility (R26.89);Unsteadiness on feet (R26.81);Muscle weakness (generalized) (M62.81);Other (comment) (cardiopulmonary status limiting activity)   Activity Tolerance Patient tolerated treatment well   Patient Left in bed;with call bell/phone within reach;with bed alarm set;with nursing/sitter in room   Nurse Communication Mobility  status;Need for lift equipment        Time: 8338-2505 OT Time Calculation (min): 32 min  Charges: OT General Charges $OT Visit: 1 Visit OT Treatments $Therapeutic Activity: 8-22 mins  Jesse Sans OTR/L Acute Rehabilitation Services Pager: [phone] Office: [place] 12/03/2021, 1:48 PM

## 2021-12-03 NOTE — Progress Notes (Signed)
NAMEShenekia Brewer, MRN:  [id number], DOB:  1966-04-24, LOS: 12 ADMISSION DATE:  11/21/2021, CONSULTATION DATE: 11/23/2021 REFERRING MD:  Benson Norway, CHIEF COMPLAINT: Obtundation  History of Present Illness:  56 year old woman who was admitted with increasing shortness of breath.  Now in hypercarbic respiratory failure with increasing obtundation.  Unable to provide history at this time.  Spoke with niece who reports increasing shortness of breath over the last few days.  No other associated symptoms.  Prior admissions for similar but has never been intubated.  Initially no hypercarbia.  Now PCO2 in the 70s.  Was initially responding to questions this morning but is no longer doing so. She has not been using CPAP for several years now has her machine has broken down and she is awaiting a repeat sleep study.  Pertinent  Medical History   Past Medical History:  Diagnosis Date   DVT (deep vein thrombosis) in pregnancy    RLE DVT 02/2016   Dyspnea    Morbid obesity (HCC)    PE (pulmonary embolism) 02/29/2016   Sleep apnea    uses a cpap    Significant Hospital Events: Including procedures, antibiotic start and stop dates in addition to other pertinent events   5/11 initial admission to hospitalist service.  Started on BiPAP and diuresed successfully for 5 L. 5/13 increasing somnolence and hypercarbia new since admission. 5/15 am intubated. Discussed trach with son who wanted to give her a chance of extubation before proceeding straight to trach 5/16 failed SBT for tachypnea 5/17 failed SBT for tachypnea 5/18 failed SBT for rapid and shallow breathing despite high PS 5/19 tracheostomy placed  Interim History / Subjective:  No events. Coughing seems better.  Objective   Blood pressure 106/63, pulse 62, temperature 98.8 F (37.1 C), temperature source Oral, resp. rate (!) 24, height 5' 8" (1.727 m), weight (!) 168.3 kg, last menstrual period 11/29/2015, SpO2 96 %.    Vent Mode:  PSV;CPAP FiO2 (%):  [50 %] 50 % Set Rate:  [12 bmp] 12 bmp Vt Set:  [380 mL] 380 mL PEEP:  [8 cmH20] 8 cmH20 Pressure Support:  [18 cmH20] 18 cmH20 Plateau Pressure:  [20 cmH20-27 cmH20] 20 cmH20   Intake/Output Summary (Last 24 hours) at 12/03/2021 0820 Last data filed at 12/03/2021 0800 Gross per 24 hour  Intake 1432.88 ml  Output 3500 ml  Net -2067.12 ml    Filed Weights   11/29/21 0439 12/01/21 0434 12/03/21 0500  Weight: (!) 191 kg (!) 190 kg (!) 168.3 kg    Examination: No distress Doing ok on PS Breath sounds and heart sounds remain distant Ext remain maybe trace edema? Moves all 4 ext to command but weak Trach with some bloody secretions  Cr/BUN/K okay   Assessment & Plan:   Acute respiratory failure with hypoxia and hypercapnia (HCC) Untreated OHS/OSA - LTVV, VAP bundle - trach placed 5/19 6.0 shiley  - continue low dose clonazepam plus standing oxycodone; fent for breakthrough  History of pulmonary embolus (PE) - switch to eliquis  Acute on chronic heart failure with preserved ejection fraction (HFpEF) ([place]) - continue to push diuresis today with lasix/diamox - K/Mg/Cr checks daily  Hyperlipidemia -home atorvastatin  Type 2 DM- sugars ok for multiple days, DC SSI  Dysphagia- appreciate continued SLP working with patient, TF for now  [employer] (right click and "Reselect all SmartList Selections" daily)   Diet/type: NPO, tube feeds DVT prophylaxis: systemic heparin GI prophylaxis: N/A Lines: PICC due to  poor peripheral ceins Foley:  purewick Code Status:  full code  Last date of multidisciplinary goals of care discussion [Updated's son 5/18 over the phone and consented for tracheostomy and bronchoscopy assistance.] LTACH vs home eventually  Patient critically ill due to respiratory failure Interventions to address this today vent and sedation titration Risk of deterioration without these interventions is high  I personally spent 33  minutes providing critical care not including any separately billable procedures  Erskine Emery MD [facility]  Prefer epic messenger for cross cover needs If after hours, please call E-link

## 2021-12-03 NOTE — Evaluation (Signed)
[facility] - Evaluation Patient Details  Name: Angel Brewer MRN: [id number] Date of Birth: 1966-05-16  Today's Date: 12/03/2021 Time: 7341-9379 SLP Time Calculation (min) (ACUTE ONLY): 15 min  Past Medical History:  Past Medical History:  Diagnosis Date   DVT (deep vein thrombosis) in pregnancy    RLE DVT 02/2016   Dyspnea    Morbid obesity (HCC)    PE (pulmonary embolism) 02/29/2016   Sleep apnea    uses a cpap   Past Surgical History:  Past Surgical History:  Procedure Laterality Date   CESAREAN SECTION     x 3   OPEN REDUCTION INTERNAL FIXATION (ORIF) DISTAL RADIAL FRACTURE Right 06/20/2019   Procedure: OPEN REDUCTION INTERNAL FIXATION (ORIF) DISTAL RADIAL FRACTURE;  Surgeon: [name], MD;  Location: [facility];  Service: Orthopedics;  Laterality: Right;  REGIONAL BLOCK TOTAL SURGERY TIME: 90 MINUTES   SYNOVECTOMY Right 11/21/2019   Procedure: RIGHT WRIST LIGAMENT RECONSTRUCTION;  Surgeon: [name], MD;  Location: [facility];  Service: Orthopedics;  Laterality: Right;  PROCEDURE: RIGHT WRIST LIGAMENT RECONSTRUCTION LENGTH OF SURGERY: 105 MIN   HPI:  Angel Brewer is a 56 y/o female who presented on 5/11 with SOB, admitted with acute on chronic heart failure. Had to be tx to ICU secondary to hypercarbic respiratory failure with increased obtundation, intubated 5/15, s/p trach 11/29/21, weaned from vent and tolerating TC trials 5/23. PMH includes HTN, recurrent PE on Eliquis, morbid obesity, DVT, radial ORIF and wrist ligament reconstruction.    Assessment / Plan / Recommendation  Clinical Impression  Pt on trach collar this afternoon and tolerating well. Her daughter and sister were at bedside; cuff was deflated at baseline. Pt with generous secretions in trach and around neck - RN provided suctioning.  PMV placed for short intervals of 1-2 breath cycles until pt was able to understand instructions and generate voice.  It took a few repeated trials until [name] was able to phonate and access upper airway. Voice quality, intelligibility, and spontaneity of communication improved dramatically over the course of 15 minutes. VS remained stable; she reported being comfortable.  Recommend pt use PMV with full supervision initially; remove when staff leave the room.  SLP will follow for PMV use/care/education. D/w sister at bedside. SLP Visit Diagnosis: Aphonia (R49.1)    SLP Assessment  Patient needs continued Speech [place] [facility]    Recommendations for follow up therapy are one component of a multi-disciplinary discharge planning process, led by the attending physician.  Recommendations may be updated based on patient status, additional functional criteria and insurance authorization.  Follow Up Recommendations  Skilled nursing-short term rehab (<3 hours/day)    Assistance Recommended at Discharge Frequent or constant Supervision/Assistance  Functional Status Assessment Patient has had a recent decline in their functional status and demonstrates the ability to make significant improvements in function in a reasonable and predictable amount of time.  Frequency and Duration min 2x/week  2 weeks    PMSV Trial PMSV was placed for: 25 minutes (combined time PMV and swallow evals) Able to redirect subglottic air through upper airway: Yes Able to Attain Phonation: Yes Voice Quality: Low vocal intensity Able to Expectorate Secretions: No attempts Breath Support for Phonation: Mildly decreased Intelligibility: Intelligibility reduced Word: 75-100% accurate Phrase: 75-100% accurate Sentence: 75-100% accurate Conversation: 75-100% accurate SpO2 During Trial: 96 % Pulse During Trial: 77   Tracheostomy Tube       Vent Dependency  FiO2 (%): 80 %    Cuff  Deflation Trial Tolerated Cuff Deflation: Yes Length of Time for Cuff Deflation Trial:  (deflated upon entering room) Behavior: Alert;Cooperative  Aguilar L. Tivis Ringer, [place] Office number [phone] Pager [phone]        Juan Quam Laurice 12/03/2021, 4:07 PM

## 2021-12-03 NOTE — Progress Notes (Signed)
[place] [place] for heparin > back to Eliquis Indication: pulmonary embolus  No Known Allergies  Patient Measurements: Height: _0  (172.7 cm) Weight: (!) 168.3 kg (371 lb 0.6 oz) IBW/kg (Calculated) : 63.9 Heparin Dosing Weight: 114kg  Vital Signs: Temp: 98.8 F (37.1 C) (05/23 0800) Temp Source: Oral (05/23 0800) BP: 106/63 (05/23 0807) Pulse Rate: 62 (05/23 0807)  Labs: Recent Labs    12/01/21 0425 12/02/21 0400 12/02/21 1453 12/03/21 0415 12/03/21 0809  HGB 11.0* 11.1*  --  11.1*  --   HCT 36.8 37.9  --  37.3  --   PLT 214 227  --  269  --   HEPARINUNFRC 0.40 0.53  --  >1.10* 0.53  CREATININE 0.78 0.80 0.84 0.93  --      Estimated Creatinine Clearance: 112.7 mL/min (by C-G formula based on SCr of 0.93 mg/dL).   Medical History: Past Medical History:  Diagnosis Date   DVT (deep vein thrombosis) in pregnancy    RLE DVT 02/2016   Dyspnea    Morbid obesity (HCC)    PE (pulmonary embolism) 02/29/2016   Sleep apnea    uses a cpap   Medications:  Infusions:   sodium chloride Stopped (11/25/21 1243)   feeding supplement (VITAL AF 1.2 CAL) 1,000 mL (12/02/21 1639)    Assessment: 56 yo female on PTA Eliquis for her hx of PE. Transferred to ICU for intubation. Her last dose was 5/13 in AM. Pharmacy to dose heparin.   Heparin drip 2200 units/hr with heparin level initial reported as > 1.1, repeated to verify  = 0.53.  Still with some bloody secretions when suctioning that started after cortrak placement 5/22.  CBC stable.  Goal of Therapy:  Heparin level 0.3-0.7 units/ml Monitor platelets by anticoagulation protocol: Yes   Plan:  Discussed with Dr. Tamala Julian, okay to change back to Eliquis today. Will resume Eliquis 5 mg BID.  [place] Crane, Roylene Reason, BCCP Clinical Pharmacist  12/03/2021 9:12 AM   [facility] pharmacy phone numbers are listed on [email or web address]

## 2021-12-04 DIAGNOSIS — I5033 Acute on chronic diastolic (congestive) heart failure: Secondary | ICD-10-CM | POA: Diagnosis not present

## 2021-12-04 LAB — BASIC METABOLIC PANEL
Anion gap: 10 (ref 5–15)
BUN: 51 mg/dL — ABNORMAL HIGH (ref 6–20)
CO2: 39 mmol/L — ABNORMAL HIGH (ref 22–32)
Calcium: 10.3 mg/dL (ref 8.9–10.3)
Chloride: 86 mmol/L — ABNORMAL LOW (ref 98–111)
Creatinine, Ser: 1.13 mg/dL — ABNORMAL HIGH (ref 0.44–1.00)
GFR, Estimated: 57 mL/min — ABNORMAL LOW (ref >60)
Glucose, Bld: 122 mg/dL — ABNORMAL HIGH (ref 70–99)
Potassium: 3.7 mmol/L (ref 3.5–5.1)
Sodium: 135 mmol/L (ref 135–145)

## 2021-12-04 LAB — GLUCOSE, CAPILLARY
Glucose-Capillary: 116 mg/dL — ABNORMAL HIGH (ref 70–99)
Glucose-Capillary: 139 mg/dL — ABNORMAL HIGH (ref 70–99)
Glucose-Capillary: 137 mg/dL — ABNORMAL HIGH (ref 70–99)
Glucose-Capillary: 118 mg/dL — ABNORMAL HIGH (ref 70–99)

## 2021-12-04 LAB — PHOSPHORUS: Phosphorus: 6.7 mg/dL — ABNORMAL HIGH (ref 2.5–4.6)

## 2021-12-04 LAB — MAGNESIUM: Magnesium: 2.5 mg/dL — ABNORMAL HIGH (ref 1.7–2.4)

## 2021-12-04 MED ORDER — FLUCONAZOLE 150 MG PO TABS
150.0000 mg | ORAL_TABLET | ORAL | Status: DC
  Administered 2021-12-04: 150 mg via ORAL
  Filled 2021-12-04: qty 1

## 2021-12-04 MED ORDER — POTASSIUM CHLORIDE 20 MEQ PO PACK
40.0000 meq | PACK | Freq: Once | ORAL | Status: AC
  Administered 2021-12-04: 40 meq
  Filled 2021-12-04: qty 2

## 2021-12-04 MED ORDER — ONDANSETRON HCL 4 MG/2ML IJ SOLN
4.0000 mg | Freq: Four times a day (QID) | INTRAMUSCULAR | Status: DC | PRN
  Administered 2021-12-05 – 2021-12-09 (×2): 4 mg via INTRAVENOUS
  Filled 2021-12-04 (×3): qty 2

## 2021-12-04 MED ORDER — FUROSEMIDE 10 MG/ML IJ SOLN
60.0000 mg | Freq: Two times a day (BID) | INTRAMUSCULAR | Status: DC
  Administered 2021-12-04 – 2021-12-09 (×11): 60 mg via INTRAVENOUS
  Filled 2021-12-04 (×11): qty 6

## 2021-12-04 MED ORDER — ACETAMINOPHEN 650 MG RE SUPP: 650.0000 mg | Freq: Four times a day (QID) | RECTAL | Status: DC | PRN

## 2021-12-04 MED ORDER — ACETAMINOPHEN 325 MG PO TABS
650.0000 mg | ORAL_TABLET | Freq: Four times a day (QID) | ORAL | Status: DC | PRN
  Administered 2021-12-04 – 2021-12-06 (×3): 650 mg
  Filled 2021-12-04 (×3): qty 2

## 2021-12-04 MED ORDER — ONDANSETRON HCL 4 MG PO TABS: 4.0000 mg | ORAL_TABLET | Freq: Four times a day (QID) | ORAL | Status: DC | PRN

## 2021-12-04 MED ORDER — FLUCONAZOLE 150 MG PO TABS
150.0000 mg | ORAL_TABLET | ORAL | Status: AC
  Administered 2021-12-07: 150 mg
  Filled 2021-12-04: qty 1

## 2021-12-04 MED ORDER — SODIUM CHLORIDE 0.9 % IV SOLN
1.0000 g | INTRAVENOUS | Status: DC
  Administered 2021-12-04 – 2021-12-06 (×3): 1 g via INTRAVENOUS
  Filled 2021-12-04 (×3): qty 10

## 2021-12-04 NOTE — Progress Notes (Signed)
NAMEZephyr Brewer, MRN:  [id number], DOB:  22-Mar-1966, LOS: 28 ADMISSION DATE:  11/21/2021, CONSULTATION DATE: 11/23/2021 REFERRING MD:  Benson Norway, CHIEF COMPLAINT: Obtundation  History of Present Illness:  56 year old woman who was admitted with increasing shortness of breath.  Now in hypercarbic respiratory failure with increasing obtundation.  Unable to provide history at this time.  Spoke with niece who reports increasing shortness of breath over the last few days.  No other associated symptoms.  Prior admissions for similar but has never been intubated.  Initially no hypercarbia.  Now PCO2 in the 70s.  Was initially responding to questions this morning but is no longer doing so. She has not been using CPAP for several years now has her machine has broken down and she is awaiting a repeat sleep study.  Pertinent  Medical History   Past Medical History:  Diagnosis Date   DVT (deep vein thrombosis) in pregnancy    RLE DVT 02/2016   Dyspnea    Morbid obesity (HCC)    PE (pulmonary embolism) 02/29/2016   Sleep apnea    uses a cpap    Significant Hospital Events: Including procedures, antibiotic start and stop dates in addition to other pertinent events   5/11 initial admission to hospitalist service.  Started on BiPAP and diuresed successfully for 5 L. 5/13 increasing somnolence and hypercarbia new since admission. 5/15 am intubated. Discussed trach with son who wanted to give her a chance of extubation before proceeding straight to trach 5/16 failed SBT for tachypnea 5/17 failed SBT for tachypnea 5/18 failed SBT for rapid and shallow breathing despite high PS 5/19 tracheostomy placed 5/24 Placed on trach collar mid morning of 5/23 and has remained on ATC since   Interim History / Subjective:  Alert and interactive this am   Objective   Blood pressure 119/69, pulse 71, temperature 98.2 F (36.8 C), temperature source Axillary, resp. rate 16, height 5' 8" (1.727 m),  weight (!) 180.6 kg, last menstrual period 11/29/2015, SpO2 96 %.    Vent Mode: PSV;CPAP FiO2 (%):  [50 %-80 %] 80 % PEEP:  [8 cmH20] 8 cmH20 Pressure Support:  [18 cmH20] 18 cmH20 Plateau Pressure:  [20 cmH20] 20 cmH20   Intake/Output Summary (Last 24 hours) at 12/04/2021 0724 Last data filed at 12/04/2021 0600 Gross per 24 hour  Intake 1720.67 ml  Output 4850 ml  Net -3129.33 ml    Filed Weights   12/01/21 0434 12/03/21 0500 12/04/21 0400  Weight: (!) 190 kg (!) 168.3 kg (!) 180.6 kg    Examination: General: Acute on chronically ill appearing middle aged female lying in bed on ATC, in NAD HEENT: 6 cuffed trach midline, MM pink/moist, PERRL,  Neuro: Alert and interactive on vent, non-focal  CV: s1s2 regular rate and rhythm, no murmur, rubs, or gallops,  PULM:  Slightly diminished breath sounds, no increased work of breathing, no added breath sounds, some thick tracheal secretions  GI: soft, bowel sounds active in all 4 quadrants, non-tender, non-distended, tolerating TF Extremities: warm/dry, no  edema  Skin: no rashes or lesions  Assessment & Plan:   Acute respiratory failure with hypoxia and hypercapnia (HCC) Untreated OHS/OSA P: Routine trach care  Continue ATC as tolerated  Aggressive pulmonary hygiene  Wean PEEP and FiO2 for sats greater than 90% Head of bed elevated 30 degrees Follow intermittent chest x-ray and ABG Continue low dose Clonazepam and scheduled Oxy  History of pulmonary embolus (PE) P: Continue Eliquis   Acute  on chronic heart failure with preserved ejection fraction (HFpEF)  -Has diuresed well with net - 11L for hospitalization this far. Diamox was added 5/23 with 4/6L removed but creatinine bumped slightly  P: Stop Diamox today  Drop lasix to q12hrs  Strict intake and output  Supplement electrolytes as needed   Hyperlipidemia P: Continue statin   Type 2 DM- sugars ok for multiple days P: Stop CBG checks and follow am labs    Dysphagia -Appreciate continued SLP working with patient, P: Continue Cortrack with TFs   Best Practice (right click and "Reselect all SmartList Selections" daily)   Diet/type: NPO, tube feeds DVT prophylaxis: systemic heparin GI prophylaxis: N/A Lines: PICC due to poor peripheral ceins Foley:  purewick Code Status:  full code  Last date of multidisciplinary goals of care discussion: Continue to update patient and family daily   Critical care: NA  Joriel Streety D. Kenton Kingfisher, NP-C [facility] Personal contact information can be found on Amion  12/04/2021, 8:22 AM

## 2021-12-04 NOTE — TOC Progression Note (Signed)
Transition of Care [facility]) - Progression Note    Patient Details  Name: Angel Brewer MRN: [id number] Date of Birth: [birth year]  Transition of [facility]) CM/SW Manuel Garcia, [place] Phone Number: 12/04/2021, 2:52 PM  Clinical Narrative:     CSW continues to follow. Patients trach collar currently at 60% fio2 and patient has cortrak. Patient and patients son would like for patient to progress to be able to go back home. Patient with patients son agreeable for SNF as back up plan.CSW will continue to follow. For SNF as back up plan Trach Collar would need to be weaned down to 28% uncuffed and cortrak out before can fax out for SNF. CSW will continue to follow.  Expected Discharge Plan:  (Home) Barriers to Discharge: Continued Medical Work up  Expected Discharge Plan and Services Expected Discharge Plan:  (Home) In-house Referral: Clinical Social Work     Living arrangements for the past 2 months: Single Family Home                                       Social Determinants of Health (SDOH) Interventions    Readmission Risk Interventions    12/02/2019    1:47 PM  Readmission Risk Prevention Plan  Post Dischage Appt Complete  Medication Screening Complete  Transportation Screening Complete

## 2021-12-04 NOTE — Progress Notes (Signed)
Heart Failure Stewardship Pharmacist Progress Note   PCP: [name], NP PCP-Cardiologist: None    HPI:  56 yo female with PMH of OSA on CPAP, diastolic CHF, HLD, and DVT 2/2 pregnancy 2017, and pulmonary HTN.  Presented to ED with progressive dyspnea at rest and on exertion, orthopnea with LEE on exam.  CXR with moderate interstitial lung markings indicative of moderate/severe CHF and pulmonary edema.  Echo 05/12 with LVEF 60-65%, mild LVH, normal RV - unchanged since 04/2021 Echo.  Adherent to PTA furosemide.  Started on IV diuretics.  Became less responsive and developed respiratory acidosis despite being on bipap and was then intubated on 5/13.  Ongoing discussions for extubation vs trach - failed SBT 05/16, 05/17, 05/18. Trach placed 5/19.   Patient reported dysuria and vaginal itching 05/24 since foley removal and was started on Ceftriaxone x5d for empiric UTI and fluconazole x2 doses for possible yeast infection.  She's afebrile, WBC wnl.  Current HF Medications: Diuretic: furosemide 60 mg IV q8h; acetazolamide 500 mg PT q12h (05/23 >> )  Prior to admission HF Medications: Diuretic: furosemide 40 mg daily  Pertinent Lab Values: Serum creatinine 1.13, up (baseline <1), BUN 51 (up), Potassium 3.7, Sodium 135, BNP 139.2, Magnesium 2.5, CO2 39   Vital Signs: Weight: 398 lbs today - 20 lbs down from 05/21; (admission weight: 433.4 lbs)  05/07/2021 discharge weight: 401 lb Blood pressure: labile - 100-20s/70-80s Heart rate: 70-80s I/O: -5.7 L yesterday; cumulative net -3.89 L  Medication Assistance / Insurance Benefits Check: Does the patient have prescription insurance?  Yes Type of insurance plan: Managed Medicaid - Entresto/SGLT2i require [place]  Outpatient Pharmacy:  Prior to admission [facility] Is the patient willing to use [facility] at discharge? Pending Is the patient willing to transition their outpatient pharmacy to utilize a [facility] outpatient  pharmacy?   Pending    Assessment: 1. Acute on chronic diastolic CHF (LVEF 40-97%). NYHA class IV symptoms. Continued good response to IV diuresis. - Continue IV diuresis.  - Off amlodipine - Further GDMT optimization limited with trach/cortrak and NPO status   Plan: 1) Medication changes recommended at this time: '- Stop Diamox 500 mg BID - CO2 improved 41 > 39 after 4 doses.  Will continue to monitor.  - Reduce IV Lasix from 60 mg q8h to q12h with almost 6L of UOP yesterday and SCr bump from 0.97 > 1.13.   - Give potassium 40 mEq x2 - K 3.6 > 3.7 despite 40 mEq x2 yesterday in setting of aggressive IV diuresis    2) Patient assistance: - None pending   3)  Education  - To be completed prior to discharge  Laurey Arrow, PharmD PGY1 Pharmacy Resident 12/04/2021  7:43 AM  Please check [email or web address] for unit-specific pharmacy phone numbers.

## 2021-12-05 ENCOUNTER — Inpatient Hospital Stay (HOSPITAL_COMMUNITY): Payer: Medicaid Other

## 2021-12-05 DIAGNOSIS — I5033 Acute on chronic diastolic (congestive) heart failure: Secondary | ICD-10-CM | POA: Diagnosis not present

## 2021-12-05 LAB — BASIC METABOLIC PANEL
Anion gap: 9 (ref 5–15)
BUN: 66 mg/dL — ABNORMAL HIGH (ref 6–20)
CO2: 37 mmol/L — ABNORMAL HIGH (ref 22–32)
Calcium: 9.9 mg/dL (ref 8.9–10.3)
Chloride: 89 mmol/L — ABNORMAL LOW (ref 98–111)
Creatinine, Ser: 1.05 mg/dL — ABNORMAL HIGH (ref 0.44–1.00)
GFR, Estimated: >60 mL/min (ref >60)
Glucose, Bld: 126 mg/dL — ABNORMAL HIGH (ref 70–99)
Potassium: 3.8 mmol/L (ref 3.5–5.1)
Sodium: 135 mmol/L (ref 135–145)

## 2021-12-05 LAB — MAGNESIUM: Magnesium: 2.8 mg/dL — ABNORMAL HIGH (ref 1.7–2.4)

## 2021-12-05 LAB — PHOSPHORUS: Phosphorus: 5.3 mg/dL — ABNORMAL HIGH (ref 2.5–4.6)

## 2021-12-05 MED ORDER — POTASSIUM CHLORIDE 20 MEQ PO PACK
40.0000 meq | PACK | Freq: Once | ORAL | Status: AC
  Administered 2021-12-05: 40 meq via ORAL
  Filled 2021-12-05: qty 2

## 2021-12-05 MED ORDER — OXYCODONE HCL 5 MG PO TABS
5.0000 mg | ORAL_TABLET | Freq: Three times a day (TID) | ORAL | Status: DC
  Administered 2021-12-05 – 2021-12-06 (×3): 5 mg
  Filled 2021-12-05 (×3): qty 1

## 2021-12-05 NOTE — Progress Notes (Signed)
Rt removed trach sutures per protocol. No complications noted.

## 2021-12-05 NOTE — Progress Notes (Signed)
NAMESavreen Brewer, MRN:  [id number], DOB:  [birth year], LOS: 21 ADMISSION DATE:  11/21/2021, CONSULTATION DATE: 11/23/2021 REFERRING MD:  Benson Norway, CHIEF COMPLAINT: Obtundation  History of Present Illness:  56 year old woman who was admitted with increasing shortness of breath.  Now in hypercarbic respiratory failure with increasing obtundation.  Unable to provide history at this time.  Spoke with niece who reports increasing shortness of breath over the last few days.  No other associated symptoms.  Prior admissions for similar but has never been intubated.  Initially no hypercarbia.  Now PCO2 in the 70s.  Was initially responding to questions this morning but is no longer doing so. She has not been using CPAP for several years now has her machine has broken down and she is awaiting a repeat sleep study.  Pertinent  Medical History   Past Medical History:  Diagnosis Date   DVT (deep vein thrombosis) in pregnancy    RLE DVT 02/2016   Dyspnea    Morbid obesity (HCC)    PE (pulmonary embolism) 02/29/2016   Sleep apnea    uses a cpap    Significant Hospital Events: Including procedures, antibiotic start and stop dates in addition to other pertinent events   5/11 initial admission to hospitalist service.  Started on BiPAP and diuresed successfully for 5 L. 5/13 increasing somnolence and hypercarbia new since admission. 5/15 am intubated. Discussed trach with son who wanted to give her a chance of extubation before proceeding straight to trach 5/16 failed SBT for tachypnea 5/17 failed SBT for tachypnea 5/18 failed SBT for rapid and shallow breathing despite high PS 5/19 tracheostomy placed 5/24 Placed on trach collar mid morning of 5/23 and has remained on ATC since  5/25 Remains on ATC this am, continues to have secretions but appears to be managing them well   Interim History / Subjective:  Alert and oriented this am  Denies any acute complaints   Objective   Blood  pressure (!) 131/110, pulse 81, temperature 98.2 F (36.8 C), temperature source Axillary, resp. rate 20, height _0  (1.727 m), weight (!) 180.6 kg, last menstrual period 11/29/2015, SpO2 93 %.    FiO2 (%):  [60 %] (P) 60 %   Intake/Output Summary (Last 24 hours) at 12/05/2021 0705 Last data filed at 12/05/2021 0500 Gross per 24 hour  Intake 1610.33 ml  Output 2050 ml  Net -439.67 ml    Filed Weights   12/01/21 0434 12/03/21 0500 12/04/21 0400  Weight: (!) 190 kg (!) 168.3 kg (!) 180.6 kg    Examination: General: Acute on chronically ill appearing middle aged female lying in bed on ATC, in NAD HEENT: 6 cuffed shiley trach midline, MM pink/moist, PERRL,  Neuro: Sleepy but easy to arouse, non-focal  CV: s1s2 regular rate and rhythm, no murmur, rubs, or gallops,  PULM:  Clear to ascultation, slightly diminished, no increased work of breathing on ATC GI: soft, bowel sounds active in all 4 quadrants, non-tender, non-distended, tolerating TF Extremities: warm/dry, generalized edema  Skin: no rashes or lesions  Assessment & Plan:   Acute respiratory failure with hypoxia and hypercapnia (HCC) Untreated OHS/OSA P: Continue routine trach care Remains on ATC and tolerating well  Push aggressive pulmoanry hygiene  Elevate HOB Continue low dose Clonazepam and scheduled oxy (decrease frequency)  History of pulmonary embolus (PE) P: Continue Eliquis    Acute on chronic heart failure with preserved ejection fraction (HFpEF)  -Has diuresed well with net - 11L for  hospitalization this far. Diamox was added 5/23 with 4/6L removed but creatinine bumped slightly  Hyperlipidemia P: Continue q12 lasix  Strict intake and output  Supplement electrolytes as needed  Continue statin   Type 2 DM- sugars ok for multiple days P: Follow am labs   Dysphagia -Appreciate continued SLP working with patient, P: Continue cortrack with TFs  Best Practice (right click and "Reselect all SmartList  Selections" daily)   Diet/type: NPO, tube feeds DVT prophylaxis: systemic heparin GI prophylaxis: N/A Lines: PICC due to poor peripheral ceins Foley:  purewick Code Status:  full code  Last date of multidisciplinary goals of care discussion: Continue to update patient and family daily   Critical care: NA  Dusty Wagoner D. Kenton Kingfisher, NP-C [facility] Personal contact information can be found on Amion  12/05/2021, 7:05 AM

## 2021-12-05 NOTE — Progress Notes (Signed)
Heart Failure Stewardship Pharmacist Progress Note   PCP: [name], NP PCP-Cardiologist: None    HPI:  55 yo female with PMH of OSA on CPAP, diastolic CHF, HLD, and DVT 2/2 pregnancy 2017, and pulmonary HTN.  Presented to ED with progressive dyspnea at rest and on exertion, orthopnea with LEE on exam.  CXR with moderate interstitial lung markings indicative of moderate/severe CHF and pulmonary edema.  Echo 05/12 with LVEF 60-65%, mild LVH, normal RV - unchanged since 04/2021 Echo.  Adherent to PTA furosemide.  Started on IV diuretics.  Became less responsive and developed respiratory acidosis despite being on bipap and was then intubated on 5/13.  Ongoing discussions for extubation vs trach - failed SBT 05/16, 05/17, 05/18. Trach placed 5/19.   Patient reported dysuria and vaginal itching 05/24 since foley removal and was started on Ceftriaxone x5d for empiric UTI and fluconazole x2 doses for possible yeast infection; afebrile, WBC wnl.    Current HF Medications: Diuretic: furosemide 60 mg IV q12h  Prior to admission HF Medications: Diuretic: furosemide 40 mg daily  Pertinent Lab Values: Serum creatinine 1.05, down (baseline <1), BUN 51 (up), Potassium 3.8, Sodium 135, BNP 139.2, Magnesium 2.8, CO2 37 s/p Diamox x2d  Vital Signs: Weight: none today; (admission weight: 433.4 lbs)  05/07/2021 discharge weight: 401 lb Blood pressure: labile - 110-30s/80-90s Heart rate: 70-80s I/O: -355 mL yesterday (-6 L uop the day prior); cumulative net -4.3 L  Medication Assistance / Insurance Benefits Check: Does the patient have prescription insurance?  Yes Type of insurance plan: Managed Medicaid - Entresto/[id number] require [place]  Outpatient Pharmacy:  Prior to admission [facility] Is the patient willing to use [facility] at discharge? Pending Is the patient willing to transition their outpatient pharmacy to utilize a [facility] outpatient pharmacy?   Pending     Assessment: 1. Acute on chronic diastolic CHF (LVEF 37-79%). NYHA class IV symptoms. - Continue Lasix IV 60 mg q12h - uop slowed significantly after cutting back from q8h > q12h and 5.7L uop - Off amlodipine - Further GDMT optimization limited with trach/cortrak and NPO status   Plan: 1) Medication changes recommended at this time: - Give potassium 40 mEq x2 again - K 3.7 > 3.8 despite 40 mEq x2 yesterday in setting of aggressive IV diuresis   - Keep Mg > 2, K >4  2) Patient assistance: - None pending   3)  Education  - To be completed prior to discharge  Laurey Arrow, PharmD PGY1 Pharmacy Resident 12/05/2021  10:17 AM  Please check [email or web address] for unit-specific pharmacy phone numbers.

## 2021-12-05 NOTE — Procedures (Signed)
Objective Swallowing Evaluation: Type of Study: FEES-Fiberoptic Endoscopic Evaluation of Swallow   Patient Details  Name: Angel Brewer MRN: [id number] Date of Birth: 07-14-1966  Today's Date: 12/05/2021 Time: SLP Start Time (ACUTE ONLY): 1027 -SLP Stop Time (ACUTE ONLY): 1051  SLP Time Calculation (min) (ACUTE ONLY): 24 min   Past Medical History:  Past Medical History:  Diagnosis Date   DVT (deep vein thrombosis) in pregnancy    RLE DVT 02/2016   Dyspnea    Morbid obesity (HCC)    PE (pulmonary embolism) 02/29/2016   Sleep apnea    uses a cpap   Past Surgical History:  Past Surgical History:  Procedure Laterality Date   CESAREAN SECTION     x 3   OPEN REDUCTION INTERNAL FIXATION (ORIF) DISTAL RADIAL FRACTURE Right 06/20/2019   Procedure: OPEN REDUCTION INTERNAL FIXATION (ORIF) DISTAL RADIAL FRACTURE;  Surgeon: [name], MD;  Location: [facility];  Service: Orthopedics;  Laterality: Right;  REGIONAL BLOCK TOTAL SURGERY TIME: 90 MINUTES   SYNOVECTOMY Right 11/21/2019   Procedure: RIGHT WRIST LIGAMENT RECONSTRUCTION;  Surgeon: [name], MD;  Location: [facility];  Service: Orthopedics;  Laterality: Right;  PROCEDURE: RIGHT WRIST LIGAMENT RECONSTRUCTION LENGTH OF SURGERY: 105 MIN   HPI: Laikynn Beaupre is a 56 y/o female who presented on 5/11 with SOB, admitted with acute on chronic heart failure. Had to be tx to ICU secondary to hypercarbic respiratory failure with increased obtundation, intubated 5/15, s/p trach 11/29/21, weaned from vent and tolerating TC trials 5/23. PMH includes HTN, recurrent PE on Eliquis, morbid obesity, DVT, radial ORIF and wrist ligament reconstruction.   Subjective: alert, interactive    Recommendations for follow up therapy are one component of a multi-disciplinary discharge planning process, led by the attending physician.  Recommendations may be updated based on patient status, additional functional criteria and insurance authorization.  Assessment  / Plan / Recommendation     12/05/2021   11:48 AM  Clinical Impressions  Clinical Impression Therapist donned PMV for FEES assessment, repositioned with pt awake and asking for tea. Pt is endentulous and therapist not aware of dentures present in room until after evaluation. Pt states she can eat without them. Mild standing secretions on false cords that she was able to mobilize and expel after FEES. Vocal quality was clear. Arytenoids appear mildly edematous and vocal cords adducting. Oral cohesion, coordination and transit of thin and puree texture was adequate without spill or delays. His timing of protective mechanisms was suitable and effective without laryngeal penetration or aspiration observed. There was mild vallecular residue with puree and thin that reduced with thin. Recommend that pt initiate Dys 3 texture (with dentures donned), PMV on, single pills with thin, sitting upright with full assist. ST will continue dysphagia therapy.  SLP Visit Diagnosis Dysphagia, pharyngeal phase (R13.13)  Impact on safety and function Mild aspiration risk         12/05/2021   11:48 AM  Treatment Recommendations  Treatment Recommendations Therapy as outlined in treatment plan below        12/05/2021   11:48 AM  Prognosis  Prognosis for Safe Diet Advancement Good       12/05/2021   11:48 AM  Diet Recommendations  SLP Diet Recommendations Dysphagia 3 (Mech soft) solids;Thin liquid  Liquid Administration via Cup;Straw  Medication Administration Whole meds with liquid  Compensations Slow rate;Small sips/bites  Postural Changes Seated upright at 90 degrees         12/05/2021   11:48 AM  Other Recommendations  Oral Care Recommendations Oral care BID  Follow Up Recommendations Skilled nursing-short term rehab (<3 hours/day)  Assistance recommended at discharge Frequent or constant Supervision/Assistance  Functional Status Assessment Patient has had a recent decline in their functional status  and demonstrates the ability to make significant improvements in function in a reasonable and predictable amount of time.       12/05/2021   11:48 AM  Frequency and Duration   Speech Therapy Frequency (ACUTE ONLY) min 2x/week  Treatment Duration 2 weeks         12/05/2021   11:48 AM  Oral Phase  Oral Phase [facility]       12/05/2021   11:48 AM  Pharyngeal Phase  Pharyngeal Phase Impaired  Pharyngeal- Thin Teaspoon Pharyngeal residue - valleculae  Pharyngeal Material does not enter airway  Pharyngeal- Thin Straw Pharyngeal residue - valleculae  Pharyngeal- Puree Pharyngeal residue - valleculae        12/05/2021   11:48 AM  Cervical Esophageal Phase   Cervical Esophageal Phase WFL     Houston Siren 12/05/2021, 12:22 PM

## 2021-12-06 DIAGNOSIS — I270 Primary pulmonary hypertension: Secondary | ICD-10-CM | POA: Diagnosis not present

## 2021-12-06 DIAGNOSIS — E662 Morbid (severe) obesity with alveolar hypoventilation: Secondary | ICD-10-CM | POA: Diagnosis not present

## 2021-12-06 DIAGNOSIS — I5033 Acute on chronic diastolic (congestive) heart failure: Secondary | ICD-10-CM | POA: Diagnosis not present

## 2021-12-06 LAB — CBC
HCT: 40.1 % (ref 36.0–46.0)
Hemoglobin: 12.1 g/dL (ref 12.0–15.0)
MCH: 28.9 pg (ref 26.0–34.0)
MCHC: 30.2 g/dL (ref 30.0–36.0)
MCV: 95.9 fL (ref 80.0–100.0)
Platelets: 344 10*3/uL (ref 150–400)
RBC: 4.18 MIL/uL (ref 3.87–5.11)
RDW: 14.1 % (ref 11.5–15.5)
WBC: 10.5 10*3/uL (ref 4.0–10.5)
nRBC: 0 % (ref 0.0–0.2)

## 2021-12-06 LAB — MAGNESIUM: Magnesium: 2.8 mg/dL — ABNORMAL HIGH (ref 1.7–2.4)

## 2021-12-06 LAB — BASIC METABOLIC PANEL
Anion gap: 10 (ref 5–15)
BUN: 68 mg/dL — ABNORMAL HIGH (ref 6–20)
CO2: 38 mmol/L — ABNORMAL HIGH (ref 22–32)
Calcium: 9.9 mg/dL (ref 8.9–10.3)
Chloride: 89 mmol/L — ABNORMAL LOW (ref 98–111)
Creatinine, Ser: 0.86 mg/dL (ref 0.44–1.00)
GFR, Estimated: >60 mL/min (ref >60)
Glucose, Bld: 134 mg/dL — ABNORMAL HIGH (ref 70–99)
Potassium: 4.3 mmol/L (ref 3.5–5.1)
Sodium: 137 mmol/L (ref 135–145)

## 2021-12-06 LAB — PHOSPHORUS: Phosphorus: 4.9 mg/dL — ABNORMAL HIGH (ref 2.5–4.6)

## 2021-12-06 MED ORDER — OXYCODONE HCL 5 MG PO TABS
5.0000 mg | ORAL_TABLET | Freq: Four times a day (QID) | ORAL | Status: DC | PRN
  Administered 2021-12-06 – 2021-12-09 (×3): 5 mg
  Filled 2021-12-06 (×3): qty 1

## 2021-12-06 MED ORDER — ENSURE ENLIVE PO LIQD
237.0000 mL | Freq: Two times a day (BID) | ORAL | Status: DC
  Administered 2021-12-06 – 2021-12-26 (×38): 237 mL via ORAL

## 2021-12-06 NOTE — Progress Notes (Signed)
Occupational Therapy Treatment Patient Details Name: Angel Brewer MRN: [id number] DOB: 05/25/1966 Today's Date: 12/06/2021   History of present illness Patient is a 56 y/o female who presents on 5/11 with SOB, admitted with acute on chronic heart failure. Had to be tx to ICU secondary to hypercarbic respiratory failure with increased obtundation, intubated 5/15. s/p trach 11/29/21. PMH includes HTN, recurrent PE on Eliquis, morbid obesity, DVT, radial ORIF and wrist ligament reconstruction.   OT comments  Pt making excellent progress, wearing PMV during session and very interactive. Session focused on first tilt bed session. OT/PT present for safety - but will plan on alternating sessions in the future to maximize therapy exposure. Pt did excellent tolerating up to 50 degrees of tilt for initial session. She does endorse neck pain at trach site. Pt able to complete a variety of standing exercises, including BUE reaching, and standing grooming tasks. Also set patient up with therabands tied to bed for BUE exercises. During tilt, Patient off loading 38% BW. Throughout session Pt on 10L O2 40% FiO2 via trach collar, SpO2 91%, HR 91. OT will continue to follow acutely. Current POC remains appropriate at this time.    Recommendations for follow up therapy are one component of a multi-disciplinary discharge planning process, led by the attending physician.  Recommendations may be updated based on patient status, additional functional criteria and insurance authorization.    Follow Up Recommendations  Skilled nursing-short term rehab (<3 hours/day)    Assistance Recommended at Discharge Frequent or constant Supervision/Assistance  Patient can return home with the following      Equipment Recommendations  Other (comment) (TBD)    Recommendations for Other Services PT consult;Speech consult    Precautions / Restrictions Precautions Precautions: Other (comment) Precaution Comments: trach, rectal  tube, watch 02. NG tube Restrictions Weight Bearing Restrictions: No       Mobility Bed Mobility Overal bed mobility: Needs Assistance Bed Mobility: Rolling Rolling: Max assist, +2 for physical assistance, +2 for safety/equipment         General bed mobility comments: adjustment of pads, Pt utilizes bed rails to assist with sliding up the bed as well    Transfers                   General transfer comment: use of KREG tilt bed     Balance                                           ADL either performed or assessed with clinical judgement   ADL Overall ADL's : Needs assistance/impaired Eating/Feeding: Set up Eating/Feeding Details (indicate cue type and reason): able to take small sips of water Grooming: Wash/dry face;Set up;Standing (tilt bed at 50 degrees)                                 General ADL Comments: excellent stamina for tolerating tilt    Extremity/Trunk Assessment Upper Extremity Assessment Upper Extremity Assessment: Generalized weakness            Vision       Perception     Praxis      Cognition Arousal/Alertness: Awake/alert Behavior During Therapy: WFL for tasks assessed/performed Overall Cognitive Status: Within Functional Limits for tasks assessed  General Comments: using PMV during session, able to follow all directions, faciliate suction as needed, answer all questions        Exercises Exercises: Other exercises Other Exercises Other Exercises: BUE reaching in 50 degrees tilt    Shoulder Instructions       General Comments frequent secretions - Pt using yankauer independently. Focus of session was toleration of WB through use of KREG tilt bed    Pertinent Vitals/ Pain       Pain Assessment Pain Assessment: Faces Faces Pain Scale: Hurts a [place] bit Pain Location: neck - trach site Pain Descriptors / Indicators: Discomfort,  Grimacing Pain Intervention(s): Monitored during session, Repositioned  Home Living                                          Prior Functioning/Environment              Frequency  Min 2X/week        Progress Toward Goals  OT Goals(current goals can now be found in the care plan section)  Progress towards OT goals: Progressing toward goals  Acute Rehab OT Goals Patient Stated Goal: get stronger OT Goal Formulation: With patient Time For Goal Achievement: 12/14/21 Potential to Achieve Goals: Good  Plan Discharge plan remains appropriate;Frequency remains appropriate    Co-evaluation    PT/OT/SLP Co-Evaluation/Treatment: Yes Reason for Co-Treatment: Complexity of the patient's impairments (multi-system involvement);For patient/therapist safety;To address functional/ADL transfers;Other (comment) (initial KREG tilt) PT goals addressed during session: Strengthening/ROM;Balance OT goals addressed during session: ADL's and self-care;Strengthening/ROM      AM-PAC OT "6 Clicks" Daily Activity     Outcome Measure   Help from another person eating meals?: A Yankovich Help from another person taking care of personal grooming?: A Curiale Help from another person toileting, which includes using toliet, bedpan, or urinal?: Total Help from another person bathing (including washing, rinsing, drying)?: A Lot Help from another person to put on and taking off regular upper body clothing?: A Lot Help from another person to put on and taking off regular lower body clothing?: Total 6 Click Score: 12    End of Session Equipment Utilized During Treatment: Oxygen (10L O2 40% FiO2 via trach collar)  OT Visit Diagnosis: Other abnormalities of gait and mobility (R26.89);Unsteadiness on feet (R26.81);Muscle weakness (generalized) (M62.81);Other (comment) (cardiopulmonary status limiting activity)   Activity Tolerance Patient tolerated treatment well   Patient Left in bed;with  call bell/phone within reach;with bed alarm set   Nurse Communication Mobility status        Time: 5916-3846 OT Time Calculation (min): 40 min  Charges: OT General Charges $OT Visit: 1 Visit OT Treatments $Self Care/Home Management : 8-22 mins  Jesse Sans OTR/L Acute Rehabilitation Services Pager: [phone] Office: [place] 12/06/2021, 11:50 AM

## 2021-12-06 NOTE — Progress Notes (Signed)
Speech Language Pathology Treatment: Dysphagia;[name] Speaking valve  Patient Details Name: Angel Brewer MRN: [id number] DOB: Oct 09, 1965 Today's Date: 12/06/2021 Time: 6812-7517 SLP Time Calculation (min) (ACUTE ONLY): 15 min  Assessment / Plan / Recommendation Clinical Impression  PMV donned on arrival with RN present giving meds via tube. Pt reported occasional globus sensation but feels it related to "just now swallowing and using it." She denied history of GERD but therapist reviewed esophageal precautions especially with positioning in bed. Pt self fed sips water via straw or graham cracker texture which she masticated without oral or pharyngeal incidents. She denied globus sensation. Pt stated she prefers to remain on chopped meats (Dys 3) for the time being. Intake was decreased at breakfast. Per RN, RD's plan is to move to nocturnal feeds once consuming at least 50% meals. ST to continue. Vocal quality and intensity normal with 100% intelligibility and adequate respiratory support. All vitals within normal range. Given her positioning, trach collar and decreased dexterity with left hand, have not attempted pt self donning and doffing presently. Will continue treatment. Can wear PMV during all waking hours.   HPI HPI: Angel Brewer is a 56 y/o female who presented on 5/11 with SOB, admitted with acute on chronic heart failure. Had to be tx to ICU secondary to hypercarbic respiratory failure with increased obtundation, intubated 5/15, s/p trach 11/29/21, weaned from vent and tolerating TC trials 5/23. PMH includes HTN, recurrent PE on Eliquis, morbid obesity, DVT, radial ORIF and wrist ligament reconstruction.      SLP Plan  Continue with current plan of care      Recommendations for follow up therapy are one component of a multi-disciplinary discharge planning process, led by the attending physician.  Recommendations may be updated based on patient status, additional functional  criteria and insurance authorization.    Recommendations  Diet recommendations: Dysphagia 3 (mechanical soft);Thin liquid Liquids provided via: Cup;Straw Medication Administration: Whole meds with liquid Supervision: Staff to assist with self feeding;Intermittent supervision to cue for compensatory strategies Compensations: Slow rate;Small sips/bites Postural Changes and/or Swallow Maneuvers: Seated upright 90 degrees;Upright 30-60 min after meal      Patient may use Passy-Muir Speech Valve: During all waking hours (remove during sleep) PMSV Supervision: Intermittent MD: Please consider changing trach tube to : Cuffless         Oral Care Recommendations: Oral care BID Follow Up Recommendations: Skilled nursing-short term rehab (<3 hours/day) SLP Visit Diagnosis: Aphonia (R49.1);Dysphagia, unspecified (R13.10) Plan: Continue with current plan of care           Houston Siren  12/06/2021, 11:23 AM

## 2021-12-06 NOTE — Progress Notes (Signed)
Physical Therapy Treatment Patient Details Name: Angel Brewer MRN: [id number] DOB: 08/19/1965 Today's Date: 12/06/2021   History of Present Illness Pt is a 56 y.o. female admitted 11/21/21 with SOB; workup for acute on chronic HF. Transfer to ICU secondary to hypercarbic respiratory failure with increased obtundation. ETT 5/15; s/p trach 5/19; trach collar 5/23. PMH includes HTN, DVT, recurrent PE on Eliquis, morbid obesity, wrist ligament reconstruction.   PT Comments    Pt progressing with mobility. Today's session focused on standing tolerance with first tilt in Kreg tilt bed. Pt demonstrated excellent progress at 50' tilt today (offloading 38% BW) - able to complete multiple bouts of standing LE therex, VSS. Pt remains limited by generalized weakness, decreased activity tolerance, and impaired balance strategies/postural reactions. Continue to recommend SNF-level therapies to maximize functional mobility and independence prior to return home.    Recommendations for follow up therapy are one component of a multi-disciplinary discharge planning process, led by the attending physician.  Recommendations may be updated based on patient status, additional functional criteria and insurance authorization.  Follow Up Recommendations  Skilled nursing-short term rehab (<3 hours/day)     Assistance Recommended at Discharge Frequent or constant Supervision/Assistance  Patient can return home with the following Two people to help with walking and/or transfers;Two people to help with bathing/dressing/bathroom;Help with stairs or ramp for entrance;Assist for transportation;Direct supervision/assist for financial management;Assistance with Education officer, environmental (measurements PT);Wheelchair cushion (measurements PT);Hospital bed (lift equipment)    Recommendations for Other Services       Precautions / Restrictions Precautions Precautions: Fall;Other  (comment) Precaution Comments: trach, cortrak, rectal tube Restrictions Weight Bearing Restrictions: No     Mobility  Bed Mobility Overal bed mobility: Needs Assistance Bed Mobility: Rolling Rolling: Max assist, +2 for physical assistance, +2 for safety/equipment         General bed mobility comments: pt able to assist sliding up in bed with use of bed rails, requiring maxA+2 Start Time: 1015 Angle: 50 degrees (15-50 degrees in increments to check tolerance due to pt's first tilt) Total Minutes in Angle: 20 minutes (estimate)  Transfers                   General transfer comment: use of KREG tilt bed    Ambulation/Gait                   Stairs             Wheelchair Mobility    Modified Rankin (Stroke Patients Only)       Balance                                            Cognition Arousal/Alertness: Awake/alert Behavior During Therapy: WFL for tasks assessed/performed Overall Cognitive Status: Within Functional Limits for tasks assessed                                 General Comments: using PMV during session, able to follow all directions, faciliate suction as needed, answer all questions, joking appropriately        Exercises Other Exercises Other Exercises: standing at 50' tilt - quad sets, TKE, partial squats, weight shifts    General Comments General comments (skin integrity, edema, etc.): pt demonstrates great tolerance to initial tilt to  up to 50' in kreg bed (offloading 38% BW). pt with frequent secretions, managing yankauer suction independently      Pertinent Vitals/Pain Pain Assessment Pain Assessment: Faces Faces Pain Scale: Hurts Causer more Pain Location: neck - trach site Pain Descriptors / Indicators: Discomfort, Grimacing Pain Intervention(s): Monitored during session, Repositioned    Home Living Family/patient expects to be discharged to:: Private residence                         Prior Function            PT Goals (current goals can now be found in the care plan section) Acute Rehab PT Goals Patient Stated Goal: get back to work Progress towards PT goals: Progressing toward goals    Frequency    Min 2X/week      PT Plan Current plan remains appropriate    Co-evaluation PT/OT/SLP Co-Evaluation/Treatment: Yes Reason for Co-Treatment: Complexity of the patient's impairments (multi-system involvement);For patient/therapist safety;To address functional/ADL transfers;Other (comment) (initiating first kreg tilt) PT goals addressed during session: Mobility/safety with mobility;Strengthening/ROM OT goals addressed during session: ADL's and self-care;Strengthening/ROM      AM-PAC PT "6 Clicks" Mobility   Outcome Measure  Help needed turning from your back to your side while in a flat bed without using bedrails?: Total Help needed moving from lying on your back to sitting on the side of a flat bed without using bedrails?: Total Help needed moving to and from a bed to a chair (including a wheelchair)?: Total Help needed standing up from a chair using your arms (e.g., wheelchair or bedside chair)?: Total Help needed to walk in hospital room?: Total Help needed climbing 3-5 steps with a railing? : Total 6 Click Score: 6    End of Session Equipment Utilized During Treatment: Oxygen (10L O2 at 40% FiO2 via trach collar) Activity Tolerance: Patient tolerated treatment well Patient left: in bed;with call bell/phone within reach Nurse Communication: Mobility status PT Visit Diagnosis: Muscle weakness (generalized) (M62.81);Pain     Time: 2370-2301 PT Time Calculation (min) (ACUTE ONLY): 40 min  Charges:  $Therapeutic Exercise: 23-37 mins                     Mabeline Caras, PT, DPT Acute Rehabilitation Services  Pager [phone] Office [place] 12/06/2021, 12:42 PM

## 2021-12-06 NOTE — Progress Notes (Signed)
NAMELaquida Brewer, MRN:  [id number], DOB:  [birth year], LOS: 15 ADMISSION DATE:  11/21/2021, CONSULTATION DATE: 11/23/2021 REFERRING MD:  Benson Norway, CHIEF COMPLAINT: Obtundation  History of Present Illness:  56 year old woman who was admitted with increasing shortness of breath.  Now in hypercarbic respiratory failure with increasing obtundation.  Unable to provide history at this time.  Spoke with niece who reports increasing shortness of breath over the last few days.  No other associated symptoms.  Prior admissions for similar but has never been intubated.  Initially no hypercarbia.  Now PCO2 in the 70s.  Was initially responding to questions this morning but is no longer doing so. She has not been using CPAP for several years now has her machine has broken down and she is awaiting a repeat sleep study.  Pertinent  Medical History   Past Medical History:  Diagnosis Date   DVT (deep vein thrombosis) in pregnancy    RLE DVT 02/2016   Dyspnea    Morbid obesity (HCC)    PE (pulmonary embolism) 02/29/2016   Sleep apnea    uses a cpap    Significant Hospital Events: Including procedures, antibiotic start and stop dates in addition to other pertinent events   5/11 initial admission to hospitalist service.  Started on BiPAP and diuresed successfully for 5 L. 5/13 increasing somnolence and hypercarbia new since admission. 5/15 am intubated. Discussed trach with son who wanted to give her a chance of extubation before proceeding straight to trach 5/16 failed SBT for tachypnea 5/17 failed SBT for tachypnea 5/18 failed SBT for rapid and shallow breathing despite high PS 5/19 tracheostomy placed 5/24 Placed on trach collar mid morning of 5/23 and has remained on ATC since  5/25 Remains on ATC this am, continues to have secretions but appears to be managing them well   Interim History / Subjective:  Using PMV up in chair position in bed trying to eat breakfast.  Objective   Blood  pressure 130/78, pulse 86, temperature 98.3 F (36.8 C), temperature source Oral, resp. rate (!) 21, height 5' 8" (1.727 m), weight (!) 179.8 kg, last menstrual period 11/29/2015, SpO2 92 %.    FiO2 (%):  [60 %] 60 %   Intake/Output Summary (Last 24 hours) at 12/06/2021 1062 Last data filed at 12/06/2021 0600 Gross per 24 hour  Intake 1365 ml  Output 2735 ml  Net -1370 ml    Filed Weights   12/03/21 0500 12/04/21 0400 12/06/21 0500  Weight: (!) 168.3 kg (!) 180.6 kg (!) 179.8 kg    Examination: No distress eating breakfast Trach with PMV in place Ext warm Aox3 Moves all 4 ext Abdomen soft  Continues to diurese okay, electrolytes okay  Assessment & Plan:   Acute respiratory failure with hypoxia and hypercapnia (HCC) Untreated OHS/OSA cured with trach History of pulmonary embolus (PE) Acute on chronic heart failure with preserved ejection fraction (HFpEF)  Hyperlipidemia Type 2 DM- controlled, no need for FS Dysphagia  - Continue TC trials - Once O2 needs more reasonable will consider cuffless and/or downsizing trach - Continue lasix for diuresis - Continue eliquis - Continue statin - Appreciate continued PT/OT/SLP input, PMV as tolerated - Stable for transfer to floor, appreciate TRH taking over as primary, PCCM will follow and see again 12/10/21, call if needed sooner   Erskine Emery MD PCCM

## 2021-12-06 NOTE — Progress Notes (Addendum)
Heart Failure Stewardship Pharmacist Progress Note   PCP: [name], NP PCP-Cardiologist: None    HPI:  56 yo female with PMH of OSA on CPAP, diastolic CHF, HLD, and DVT 2/2 pregnancy 2017, and pulmonary HTN.  Presented to ED with progressive dyspnea at rest and on exertion, orthopnea with LEE on exam.  CXR with moderate interstitial lung markings indicative of moderate/severe CHF and pulmonary edema.  Echo 05/12 with LVEF 60-65%, mild LVH, normal RV - unchanged since 04/2021 Echo.  Adherent to PTA furosemide.  Started on IV diuretics.  Became less responsive and developed respiratory acidosis despite being on bipap and was then intubated on 5/13.  Ongoing discussions for extubation vs trach - failed SBT 05/16, 05/17, 05/18. Trach placed 5/19.   Patient reported dysuria and vaginal itching 05/24 since foley removal and was started on Ceftriaxone x5d for empiric UTI and fluconazole x2 doses for possible yeast infection; afebrile, WBC wnl.    Current HF Medications: Diuretic: furosemide 60 mg IV q12h  Prior to admission HF Medications: Diuretic: furosemide 40 mg daily  Pertinent Lab Values: Serum creatinine 0.86, down (baseline <1), BUN 68 (up), Potassium 4.3, Sodium 137, BNP 139.2, Magnesium 2.8, CO2 38 (up) s/p Diamox x2d  Vital Signs: Weight: 396 lbs; (admission weight: 433.4 lbs)  05/07/2021 discharge weight: 401 lb Blood pressure: 120-30s/80s Heart rate: 70-80s I/O: 2.25 L yesterday; cumulative net -6.73 L  Medication Assistance / Insurance Benefits Check: Does the patient have prescription insurance?  Yes Type of insurance plan: Managed Medicaid - Entresto/SGLT2i require PA  Outpatient Pharmacy:  Prior to admission outpatient [facility]: [facility] Is the patient willing to use [facility] at discharge? Pending Is the patient willing to transition their outpatient pharmacy to utilize a [facility] outpatient pharmacy?   Pending    Assessment: 1. Acute on chronic diastolic  CHF (LVEF 27-00%). NYHA class IV symptoms. - Continue Lasix IV 60 mg q12h - down 2 lbs over past 2 days (since reducing from q8h > q12h); 37 lbs total this admission - Off amlodipine - Further GDMT optimization limited with trach/cortrak and NPO status   Plan: 1) Medication changes recommended at this time: - Give potassium 40 mEq x2 again - K 3.7 > 3.8 despite 40 mEq x2 yesterday in setting of aggressive IV diuresis   - Keep Mg > 2, K >4  2) Patient assistance: - None pending   3)  Education  - Patient has been educated on current HF medications and potential additions to HF medication regimen - Patient verbalizes understanding that over the next few months, these medication doses may change and more medications may be added to optimize HF regimen - Patient has been educated on basic disease state pathophysiology and goals of therapy  Laurey Arrow, PharmD PGY1 Pharmacy Resident 12/06/2021  7:53 AM  Please check [email or web address] for unit-specific pharmacy phone numbers.

## 2021-12-06 NOTE — Progress Notes (Signed)
Nutrition Follow-up  DOCUMENTATION CODES:   Obesity unspecified  INTERVENTION:   Continue Tube Feeding via Cortrak:  Vital AF 1.2 at 55 ml/hr Pro-Source TF 90 mL TID This provides 1840 kcals, 165 g of protein and 1069 mL of free water Recommend continuing current TF regimen until pt is consistently meeting 50% of estimated needs PO. Consider adjusting to nocturnal once this goal is met.  Ensure Enlive po BID, each supplement provides 350 kcal and 20 grams of protein.  NUTRITION DIAGNOSIS:   Inadequate oral intake related to inability to eat as evidenced by NPO status.  Being addressed via TF   GOAL:   Patient will meet greater than or equal to 90% of their needs  Progressing  MONITOR:   Diet advancement, Vent status, Weight trends, Labs, I & O's, TF tolerance  REASON FOR ASSESSMENT:   Consult Enteral/tube feeding initiation and management  ASSESSMENT:   56 y.o. female presented to the ED with dyspnea. PMH includes COPD, HTN, and CHF. Pt admitted with acute on chronic heart failure and acute respiratory failure with hypoxia.  5/13 - intubated 5/14 - TF initiated 5/15 - Cortrak placed (tip post-pyloric) 5/19 - Trach placed 5/22 - Cortrak replaced 5/23 - placed on trach collar  SLP initiated PMV trials 5/23 and pt underwent FEES 5/25. Able to be advanced to a DYS 3 diet with thin liquids.   Pt resting in bed at the time of assessment. Reports that oral intake is progressing slowly, but that she ate a couple of bites of  applesauce, jello, and eggs this AM. Doing well with fluids. Cortrak remains in place this AM with TF infusing at 36m/h.  Discussed intake with RN. Reports that pt is attempting to eat, but intake is nowhere near adequate to meet estimated needs. Recommend continuing TF at current rate until PO is consistent and improving. Would favor adjusting to nocturnal feeds when pt is meeting ~50% of needs orally. Will add ensure today, pt has no flavor  preference.   Intake/Output Summary (Last 24 hours) at 12/06/2021 1251 Last data filed at 12/06/2021 0600 Gross per 24 hour  Intake 1265 ml  Output 1835 ml  Net -570 ml  Net IO Since Admission: -13,007.11 mL [12/06/21 1251]  Nutritionally Relevant Medications: Scheduled Meds:  atorvastatin  20 mg Per NG tube Daily   docusate  100 mg Per Tube BID   PROSource TF  90 mL Per Tube TID   furosemide  60 mg Intravenous Q12H   pantoprazole sodium  40 mg Per Tube Daily   polyethylene glycol  17 g Per Tube Daily   senna-docusate  1 tablet Per Tube QHS   Continuous Infusions:  cefTRIAXone (ROCEPHIN)  IV 1 g (12/06/21 1008)   feeding supplement (VITAL AF 1.2 CAL) 1,000 mL (12/05/21 0340)   PRN Meds: ondansetron  Labs Reviewed: Phosphorus 4.9 Mg 2.8 BUN 68, creatinine .860 Diet Order:   Diet Order             DIET DYS 3 Room service appropriate? Yes; Fluid consistency: Thin  Diet effective now                   EDUCATION NEEDS:   Not appropriate for education at this time  Skin:  Skin Assessment: Reviewed RN Assessment  Last BM:  5/25 - type 7  Height:   Ht Readings from Last 1 Encounters:  11/24/21 _0  (1.727 m)    Weight:   Wt Readings from  Last 1 Encounters:  12/06/21 (!) 179.8 kg    Ideal Body Weight:  63.6 kg  BMI:  Body mass index is 60.27 kg/m.  Estimated Nutritional Needs:   Kcal:  1600-1800 kcals  Protein:  140-175 g  Fluid:  >/= 1.6 L    Ranell Patrick, RD, LDN Clinical Dietitian RD pager # available in [place]  After hours/weekend pager # available in [facility]

## 2021-12-07 DIAGNOSIS — I5033 Acute on chronic diastolic (congestive) heart failure: Secondary | ICD-10-CM | POA: Diagnosis not present

## 2021-12-07 LAB — CBC
HCT: 38.2 % (ref 36.0–46.0)
Hemoglobin: 11.7 g/dL — ABNORMAL LOW (ref 12.0–15.0)
MCH: 28.7 pg (ref 26.0–34.0)
MCHC: 30.6 g/dL (ref 30.0–36.0)
MCV: 93.6 fL (ref 80.0–100.0)
Platelets: 325 10*3/uL (ref 150–400)
RBC: 4.08 MIL/uL (ref 3.87–5.11)
RDW: 14.1 % (ref 11.5–15.5)
WBC: 9.4 10*3/uL (ref 4.0–10.5)
nRBC: 0 % (ref 0.0–0.2)

## 2021-12-07 LAB — BASIC METABOLIC PANEL
Anion gap: 10 (ref 5–15)
BUN: 65 mg/dL — ABNORMAL HIGH (ref 6–20)
CO2: 39 mmol/L — ABNORMAL HIGH (ref 22–32)
Calcium: 9.8 mg/dL (ref 8.9–10.3)
Chloride: 87 mmol/L — ABNORMAL LOW (ref 98–111)
Creatinine, Ser: 0.84 mg/dL (ref 0.44–1.00)
GFR, Estimated: >60 mL/min (ref >60)
Glucose, Bld: 114 mg/dL — ABNORMAL HIGH (ref 70–99)
Potassium: 3.7 mmol/L (ref 3.5–5.1)
Sodium: 136 mmol/L (ref 135–145)

## 2021-12-07 LAB — MAGNESIUM: Magnesium: 2.6 mg/dL — ABNORMAL HIGH (ref 1.7–2.4)

## 2021-12-07 LAB — PHOSPHORUS: Phosphorus: 4.8 mg/dL — ABNORMAL HIGH (ref 2.5–4.6)

## 2021-12-07 MED ORDER — ORAL CARE MOUTH RINSE
15.0000 mL | Freq: Two times a day (BID) | OROMUCOSAL | Status: DC
  Administered 2021-12-07 – 2022-01-07 (×37): 15 mL via OROMUCOSAL

## 2021-12-07 MED ORDER — CHLORHEXIDINE GLUCONATE 0.12 % MT SOLN
15.0000 mL | Freq: Two times a day (BID) | OROMUCOSAL | Status: DC
  Administered 2021-12-07 – 2021-12-26 (×30): 15 mL via OROMUCOSAL
  Filled 2021-12-07 (×36): qty 15

## 2021-12-07 MED ORDER — POTASSIUM CHLORIDE 20 MEQ PO PACK
20.0000 meq | PACK | Freq: Two times a day (BID) | ORAL | Status: DC
  Administered 2021-12-07 – 2021-12-09 (×6): 20 meq
  Filled 2021-12-07 (×6): qty 1

## 2021-12-07 NOTE — Progress Notes (Signed)
PROGRESS NOTE    Angel Brewer  HDQ:[id number] DOB: Nov 29, 1965 DOA: 11/21/2021 PCP: [name], NP  Angel Brewer is a 56 y.o. female with medical history significant for HFpEF (last EF 60-65% October 2022), history of recurrent PE on Eliquis, pulmonary hypertension, HTN, HLD, OSA/OHS on CPAP, morbid obesity who presented to the ED for evaluation of dyspnea.  Chest x-ray noted pulmonary edema 5/11 initial admission to hospitalist service.  Started on BiPAP and diuresed successfully for 5 L. 5/13 increasing somnolence and hypercarbia new since admission. 5/15 am intubated. Discussed trach with son who wanted to give her a chance of extubation before proceeding straight to trach 5/16 failed SBT for tachypnea 5/17 failed SBT for tachypnea 5/18 failed SBT for rapid and shallow breathing despite high PS 5/19 tracheostomy placed 5/24 Placed on trach collar mid morning of 5/23 and has remained on ATC since  5/25 Remains on ATC this am, continues to have secretions, but managing Transferred from PCCM to [facility] service 5/27  Subjective: -Feels okay, tolerating dysphagia 3 diet-started yesterday  Assessment and Plan:  Acute on chronic hypoxic and hypercapnic respiratory failure Untreated OSA/OHS, morbid obesity -Treated with BiPAP initially, eventually intubated 5/15, failed spontaneous breathing trials and underwent tracheostomy on 5/19 -Stable on trach collar since 5/24, PCCM following for this -Continue diuretics as well -Trach care  Acute on chronic heart failure with preserved ejection fraction (HFpEF) ([place]) -Echo this admission noted preserved EF and RV function -Continue IV Lasix, she is 8.5 L negative -Diamox was added 5/23 -Will add low-dose Aldactone tomorrow if BP remains stable -Wean O2 as tolerated  History of pulmonary embolus (PE) History of recurrent PE.  Continue Eliquis.  Hyperlipidemia Continue atorvastatin.  Essential hypertension Continue amlodipine.  Type  2 diabetes mellitus -CBGs are stable, continue sliding scale insulin  Dysphagia -SLP following, started D3 diet 5/25 -Wean off tube feeds tomorrow if she continues to tolerate diet  DVT prophylaxis: Apixaban Code Status: Full code Family Communication: Discussed with patient detail, no family at bedside Disposition Plan: To be determined  Consultants:  PCCM  Procedures: Tracheostomy 5/19  Antimicrobials:    Objective: Vitals:   12/06/21 2310 12/07/21 0010 12/07/21 0307 12/07/21 0400  BP:  123/62 123/62 122/75  Pulse: 72 73 68 78  Resp: (!) _0 Temp:  98.4 F (36.9 C)  98.7 F (37.1 C)  TempSrc:  Oral    SpO2: 94% 96% 96% 96%  Weight:      Height:        Intake/Output Summary (Last 24 hours) at 12/07/2021 1194 Last data filed at 12/07/2021 0545 Gross per 24 hour  Intake 1320 ml  Output 1850 ml  Net -530 ml   Filed Weights   12/04/21 0400 12/06/21 0500 12/06/21 2110  Weight: (!) 180.6 kg (!) 179.8 kg (!) 177.8 kg    Examination:  General exam: Morbidly obese chronically ill female sitting up in bed, AAOx3, no distress HEENT: Tracheostomy with trach collar, NG tube noted CVS: S1-S2, regular rhythm Lungs: Scattered upper airway sounds Abdomen: Soft, obese, nontender, bowel sounds present Extremities: 1+ edema  Skin: No rashes Psychiatry:  Mood & affect appropriate.     Data Reviewed:   CBC: Recent Labs  Lab 12/01/21 0425 12/02/21 0400 12/03/21 0415 12/06/21 0547 12/07/21 0444  WBC 8.4 7.5 6.3 10.5 9.4  HGB 11.0* 11.1* 11.1* 12.1 11.7*  HCT 36.8 37.9 37.3 40.1 38.2  MCV 95.6 96.7 96.9 95.9 93.6  PLT 214 227 269  344 546   Basic Metabolic Panel: Recent Labs  Lab 12/03/21 0415 12/03/21 1335 12/04/21 0405 12/05/21 0315 12/06/21 0547 12/07/21 0444  NA 139 137 135 135 137 136  K 4.0 3.6 3.7 3.8 4.3 3.7  CL 89* 87* 86* 89* 89* 87*  CO2 40* 40* 39* 37* 38* 39*  GLUCOSE 95 104* 122* 126* 134* 114*  BUN 38* 41* 51* 66* 68* 65*   CREATININE 0.93 0.97 1.13* 1.05* 0.86 0.84  CALCIUM 9.9 9.7 10.3 9.9 9.9 9.8  MG 2.3  --  2.5* 2.8* 2.8* 2.6*  PHOS 5.7*  --  6.7* 5.3* 4.9* 4.8*   GFR: Estimated Creatinine Clearance: 127.6 mL/min (by C-G formula based on SCr of 0.84 mg/dL). Liver Function Tests: No results for input(s): AST, ALT, ALKPHOS, BILITOT, PROT, ALBUMIN in the last 168 hours. No results for input(s): LIPASE, AMYLASE in the last 168 hours. No results for input(s): AMMONIA in the last 168 hours. Coagulation Profile: No results for input(s): INR, PROTIME in the last 168 hours. Cardiac Enzymes: No results for input(s): CKTOTAL, CKMB, CKMBINDEX, TROPONINI in the last 168 hours. BNP (last 3 results) No results for input(s): PROBNP in the last 8760 hours. HbA1C: No results for input(s): HGBA1C in the last 72 hours. CBG: Recent Labs  Lab 12/03/21 2256 12/04/21 0414 12/04/21 0746 12/04/21 1144 12/04/21 1623  GLUCAP 121* 116* 139* 137* 118*   Lipid Profile: No results for input(s): CHOL, HDL, LDLCALC, TRIG, CHOLHDL, LDLDIRECT in the last 72 hours. Thyroid Function Tests: No results for input(s): TSH, T4TOTAL, FREET4, T3FREE, THYROIDAB in the last 72 hours. Anemia Panel: No results for input(s): VITAMINB12, FOLATE, FERRITIN, TIBC, IRON, RETICCTPCT in the last 72 hours. Urine analysis:    Component Value Date/Time   COLORURINE AMBER (A) 11/23/2019 0220   APPEARANCEUR CLOUDY (A) 11/23/2019 0220   LABSPEC 1.027 11/23/2019 0220   PHURINE 5.0 11/23/2019 0220   GLUCOSEU NEGATIVE 11/23/2019 0220   HGBUR NEGATIVE 11/23/2019 0220   BILIRUBINUR NEGATIVE 11/23/2019 0220   KETONESUR NEGATIVE 11/23/2019 0220   PROTEINUR 100 (A) 11/23/2019 0220   NITRITE NEGATIVE 11/23/2019 0220   LEUKOCYTESUR NEGATIVE 11/23/2019 0220   Sepsis Labs: _0 (procalcitonin:4,lacticidven:4)  ) Recent Results (from the past 240 hour(s))  Expectorated Sputum Assessment w Gram Stain, Rflx to Resp Cult     Status: None    Collection Time: 11/30/21  9:16 AM   Specimen: Sputum  Result Value Ref Range Status   Specimen Description SPUTUM  Final   Special Requests NONE  Final   Sputum evaluation   Final    THIS SPECIMEN IS ACCEPTABLE FOR SPUTUM CULTURE Performed at [facility], [place] [address], [place], [place] [address]    Report Status 11/30/2021 FINAL  Final  Culture, Respiratory w Gram Stain     Status: None   Collection Time: 11/30/21  9:16 AM   Specimen: SPU  Result Value Ref Range Status   Specimen Description SPUTUM  Final   Special Requests NONE Reflexed from [id number]  Final   Gram Stain   Final    ABUNDANT WBC PRESENT, PREDOMINANTLY PMN NO ORGANISMS SEEN    Culture   Final    Normal respiratory flora-no Staph aureus or Pseudomonas seen Performed at [place] [address] [place], [place] [address]    Report Status 12/02/2021 FINAL  Final     Radiology Studies: No results found.   Scheduled Meds:  apixaban  5 mg Per Tube BID  atorvastatin  20 mg Per NG tube Daily   chlorhexidine  15 mL Mouth Rinse BID   Chlorhexidine Gluconate Cloth  6 each Topical Q0600   clonazepam  0.25 mg Per Tube BID   docusate  100 mg Per Tube BID   feeding supplement  237 mL Oral BID BM   feeding supplement (PROSource TF)  90 mL Per Tube TID   fluconazole  150 mg Per Tube Q72H   furosemide  60 mg Intravenous Q12H   mouth rinse  15 mL Mouth Rinse q12n4p   pantoprazole sodium  40 mg Per Tube Daily   polyethylene glycol  17 g Per Tube Daily   senna-docusate  1 tablet Per Tube QHS   sodium chloride flush  10-40 mL Intracatheter Q12H   sodium chloride flush  3 mL Intravenous Q12H   Continuous Infusions:  sodium chloride Stopped (12/04/21 1440)   cefTRIAXone (ROCEPHIN)  IV Stopped (12/06/21 1038)   feeding supplement (VITAL AF 1.2 CAL) 1,000 mL (12/06/21 2133)     LOS: 16 days    Time spent: 34mn    PDomenic Polite MD [facility]   12/07/2021, 8:22 AM

## 2021-12-07 NOTE — Plan of Care (Signed)
  Problem: Education: ?Goal: Ability to demonstrate management of disease process will improve ?Outcome: Progressing ?  ?

## 2021-12-08 DIAGNOSIS — I5033 Acute on chronic diastolic (congestive) heart failure: Secondary | ICD-10-CM | POA: Diagnosis not present

## 2021-12-08 LAB — BASIC METABOLIC PANEL
Anion gap: 10 (ref 5–15)
BUN: 64 mg/dL — ABNORMAL HIGH (ref 6–20)
CO2: 39 mmol/L — ABNORMAL HIGH (ref 22–32)
Calcium: 9.9 mg/dL (ref 8.9–10.3)
Chloride: 87 mmol/L — ABNORMAL LOW (ref 98–111)
Creatinine, Ser: 0.86 mg/dL (ref 0.44–1.00)
GFR, Estimated: >60 mL/min (ref >60)
Glucose, Bld: 114 mg/dL — ABNORMAL HIGH (ref 70–99)
Potassium: 3.8 mmol/L (ref 3.5–5.1)
Sodium: 136 mmol/L (ref 135–145)

## 2021-12-08 LAB — CBC
HCT: 38.7 % (ref 36.0–46.0)
Hemoglobin: 11.7 g/dL — ABNORMAL LOW (ref 12.0–15.0)
MCH: 28.3 pg (ref 26.0–34.0)
MCHC: 30.2 g/dL (ref 30.0–36.0)
MCV: 93.5 fL (ref 80.0–100.0)
Platelets: 326 10*3/uL (ref 150–400)
RBC: 4.14 MIL/uL (ref 3.87–5.11)
RDW: 14.4 % (ref 11.5–15.5)
WBC: 10.2 10*3/uL (ref 4.0–10.5)
nRBC: 0.2 % (ref 0.0–0.2)

## 2021-12-08 MED ORDER — SPIRONOLACTONE 12.5 MG HALF TABLET
12.5000 mg | ORAL_TABLET | Freq: Every day | ORAL | Status: DC
  Administered 2021-12-08 – 2021-12-17 (×10): 12.5 mg via ORAL
  Filled 2021-12-08 (×10): qty 1

## 2021-12-08 MED ORDER — POLYETHYLENE GLYCOL 3350 17 G PO PACK: 17.0000 g | PACK | Freq: Every day | ORAL | Status: DC | PRN

## 2021-12-08 MED ORDER — SENNOSIDES-DOCUSATE SODIUM 8.6-50 MG PO TABS: 1.0000 | ORAL_TABLET | Freq: Every evening | ORAL | Status: DC | PRN

## 2021-12-08 NOTE — Progress Notes (Signed)
PROGRESS NOTE    Angel Brewer  JXB:[id number] DOB: Jun 04, 1966 DOA: 11/21/2021 PCP: [name], NP  Angel Brewer is a 56 y.o. female with medical history significant for HFpEF (last EF 60-65% October 2022), history of recurrent PE on Eliquis, pulmonary hypertension, HTN, HLD, OSA/OHS on CPAP, morbid obesity who presented to the ED for evaluation of dyspnea.  Chest x-ray noted pulmonary edema 5/11 initial admission to hospitalist service.  Started on BiPAP and diuresed successfully for 5 L. 5/13 increasing somnolence and hypercarbia new since admission. 5/15 am intubated. Discussed trach with son who wanted to give her a chance of extubation before proceeding straight to trach 5/16 failed SBT for tachypnea 5/17 failed SBT for tachypnea 5/18 failed SBT for rapid and shallow breathing despite high PS 5/19 tracheostomy placed 5/24 Placed on trach collar mid morning of 5/23 and has remained on ATC since  5/25 Remains on ATC this am, continues to have secretions, but managing Transferred from PCCM to [facility] service 5/27  Subjective: -Feels okay, tolerating dysphagia 3 diet-started yesterday  Assessment and Plan:  Acute on chronic hypoxic and hypercapnic respiratory failure Untreated OSA/OHS, morbid obesity -Treated with BiPAP initially, eventually intubated 5/15, failed spontaneous breathing trials and underwent tracheostomy on 5/19 -Stable on trach collar since 5/24, PCCM following -Continue diuretics as well -Trach care -PT OT following, SNF recommended  Dysphagia -SLP following, started D3 diet 5/25 -Change tube feeds to nightly only and wean off in the next day or 2 as p.o. intake improves  Acute on chronic heart failure with preserved ejection fraction (HFpEF) ([place]) -Echo this admission noted preserved EF and RV function -Continue IV Lasix, she is 8.5 L negative, weight down 25 pounds -Diamox was added 5/23 -Will add low-dose Aldactone today -Wean O2 as  tolerated  History of pulmonary embolus (PE) History of recurrent PE.  Continue Eliquis.  Hyperlipidemia Continue atorvastatin.  Essential hypertension Continue amlodipine.  Type 2 diabetes mellitus -CBGs are stable, continue sliding scale insulin  DVT prophylaxis: Apixaban Code Status: Full code Family Communication: Discussed with patient detail, no family at bedside Disposition Plan: To be determined  Consultants:  PCCM  Procedures: Tracheostomy 5/19  Antimicrobials:    Objective: Vitals:   12/08/21 0000 12/08/21 0342 12/08/21 0735 12/08/21 0851  BP: 122/86 130/85 139/90   Pulse: 74 77 70 77  Resp: _0 (!) 24  Temp: 98.1 F (36.7 C) 98.1 F (36.7 C)    TempSrc: Oral Oral Oral   SpO2: 97% 95% 98% 93%  Weight:  (!) 185 kg    Height:        Intake/Output Summary (Last 24 hours) at 12/08/2021 0948 Last data filed at 12/08/2021 0344 Gross per 24 hour  Intake 560 ml  Output 1200 ml  Net -640 ml   Filed Weights   12/06/21 0500 12/06/21 2110 12/08/21 0342  Weight: (!) 179.8 kg (!) 177.8 kg (!) 185 kg    Examination:  General exam: Morbidly obese chronically ill female sitting up in bed, AAOx3, no distress HEENT: Tracheostomy with trach collar, NG tube noted CVS: S1-S2, regular rhythm Lungs: Scattered upper airway sounds Abdomen: Soft, obese, nontender, bowel sounds present Extremities: Trace edema  Skin: No rashes Psychiatry:  Mood & affect appropriate.     Data Reviewed:   CBC: Recent Labs  Lab 12/02/21 0400 12/03/21 0415 12/06/21 0547 12/07/21 0444 12/08/21 0515  WBC 7.5 6.3 10.5 9.4 10.2  HGB 11.1* 11.1* 12.1 11.7* 11.7*  HCT 37.9 37.3 40.1 38.2  38.7  MCV 96.7 96.9 95.9 93.6 93.5  PLT 227 269 344 325 660   Basic Metabolic Panel: Recent Labs  Lab 12/03/21 0415 12/03/21 1335 12/04/21 0405 12/05/21 0315 12/06/21 0547 12/07/21 0444 12/08/21 0515  NA 139   < > 135 135 137 136 136  K 4.0   < > 3.7 3.8 4.3 3.7 3.8  CL 89*   < >  86* 89* 89* 87* 87*  CO2 40*   < > 39* 37* 38* 39* 39*  GLUCOSE 95   < > 122* 126* 134* 114* 114*  BUN 38*   < > 51* 66* 68* 65* 64*  CREATININE 0.93   < > 1.13* 1.05* 0.86 0.84 0.86  CALCIUM 9.9   < > 10.3 9.9 9.9 9.8 9.9  MG 2.3  --  2.5* 2.8* 2.8* 2.6*  --   PHOS 5.7*  --  6.7* 5.3* 4.9* 4.8*  --    < > = values in this interval not displayed.   GFR: Estimated Creatinine Clearance: 128 mL/min (by C-G formula based on SCr of 0.86 mg/dL). Liver Function Tests: No results for input(s): AST, ALT, ALKPHOS, BILITOT, PROT, ALBUMIN in the last 168 hours. No results for input(s): LIPASE, AMYLASE in the last 168 hours. No results for input(s): AMMONIA in the last 168 hours. Coagulation Profile: No results for input(s): INR, PROTIME in the last 168 hours. Cardiac Enzymes: No results for input(s): CKTOTAL, CKMB, CKMBINDEX, TROPONINI in the last 168 hours. BNP (last 3 results) No results for input(s): PROBNP in the last 8760 hours. HbA1C: No results for input(s): HGBA1C in the last 72 hours. CBG: Recent Labs  Lab 12/03/21 2256 12/04/21 0414 12/04/21 0746 12/04/21 1144 12/04/21 1623  GLUCAP 121* 116* 139* 137* 118*   Lipid Profile: No results for input(s): CHOL, HDL, LDLCALC, TRIG, CHOLHDL, LDLDIRECT in the last 72 hours. Thyroid Function Tests: No results for input(s): TSH, T4TOTAL, FREET4, T3FREE, THYROIDAB in the last 72 hours. Anemia Panel: No results for input(s): VITAMINB12, FOLATE, FERRITIN, TIBC, IRON, RETICCTPCT in the last 72 hours. Urine analysis:    Component Value Date/Time   COLORURINE AMBER (A) 11/23/2019 0220   APPEARANCEUR CLOUDY (A) 11/23/2019 0220   LABSPEC 1.027 11/23/2019 0220   PHURINE 5.0 11/23/2019 0220   GLUCOSEU NEGATIVE 11/23/2019 0220   HGBUR NEGATIVE 11/23/2019 0220   BILIRUBINUR NEGATIVE 11/23/2019 0220   KETONESUR NEGATIVE 11/23/2019 0220   PROTEINUR 100 (A) 11/23/2019 0220   NITRITE NEGATIVE 11/23/2019 0220   LEUKOCYTESUR NEGATIVE 11/23/2019  0220   Sepsis Labs: _0 (procalcitonin:4,lacticidven:4)  ) Recent Results (from the past 240 hour(s))  Expectorated Sputum Assessment w Gram Stain, Rflx to Resp Cult     Status: None   Collection Time: 11/30/21  9:16 AM   Specimen: Sputum  Result Value Ref Range Status   Specimen Description SPUTUM  Final   Special Requests NONE  Final   Sputum evaluation   Final    THIS SPECIMEN IS ACCEPTABLE FOR SPUTUM CULTURE Performed at [facility], [place] [address], [place], [place] [address]    Report Status 11/30/2021 FINAL  Final  Culture, Respiratory w Gram Stain     Status: None   Collection Time: 11/30/21  9:16 AM   Specimen: SPU  Result Value Ref Range Status   Specimen Description SPUTUM  Final   Special Requests NONE Reflexed from [id number]  Final   Gram Stain   Final    ABUNDANT WBC PRESENT, PREDOMINANTLY PMN NO ORGANISMS  SEEN    Culture   Final    Normal respiratory flora-no Staph aureus or Pseudomonas seen Performed at [facility] [address], [place], [place] [address]    Report Status 12/02/2021 FINAL  Final     Radiology Studies: No results found.   Scheduled Meds:  apixaban  5 mg Per Tube BID   atorvastatin  20 mg Per NG tube Daily   chlorhexidine  15 mL Mouth Rinse BID   Chlorhexidine Gluconate Cloth  6 each Topical Q0600   clonazepam  0.25 mg Per Tube BID   feeding supplement  237 mL Oral BID BM   furosemide  60 mg Intravenous Q12H   mouth rinse  15 mL Mouth Rinse q12n4p   pantoprazole sodium  40 mg Per Tube Daily   potassium chloride  20 mEq Per Tube BID   sodium chloride flush  10-40 mL Intracatheter Q12H   sodium chloride flush  3 mL Intravenous Q12H   Continuous Infusions:  sodium chloride Stopped (12/04/21 1440)   feeding supplement (VITAL AF 1.2 CAL) 1,000 mL (12/07/21 1604)     LOS: 17 days    Time spent: 59mn    PDomenic Polite MD [facility]   12/08/2021, 9:48 AM

## 2021-12-08 NOTE — Plan of Care (Signed)
  Problem: Clinical Measurements: Goal: Will remain free from infection Outcome: Completed/Met   Problem: Pain Managment: Goal: General experience of comfort will improve Outcome: Completed/Met

## 2021-12-09 DIAGNOSIS — I5033 Acute on chronic diastolic (congestive) heart failure: Secondary | ICD-10-CM | POA: Diagnosis not present

## 2021-12-09 LAB — BASIC METABOLIC PANEL
Anion gap: 10 (ref 5–15)
BUN: 64 mg/dL — ABNORMAL HIGH (ref 6–20)
CO2: 38 mmol/L — ABNORMAL HIGH (ref 22–32)
Calcium: 9.8 mg/dL (ref 8.9–10.3)
Chloride: 86 mmol/L — ABNORMAL LOW (ref 98–111)
Creatinine, Ser: 1.01 mg/dL — ABNORMAL HIGH (ref 0.44–1.00)
GFR, Estimated: >60 mL/min (ref >60)
Glucose, Bld: 123 mg/dL — ABNORMAL HIGH (ref 70–99)
Potassium: 4.3 mmol/L (ref 3.5–5.1)
Sodium: 134 mmol/L — ABNORMAL LOW (ref 135–145)

## 2021-12-09 MED ORDER — DICYCLOMINE HCL 10 MG PO CAPS
10.0000 mg | ORAL_CAPSULE | Freq: Two times a day (BID) | ORAL | Status: DC | PRN
  Administered 2021-12-09 – 2021-12-10 (×2): 10 mg via ORAL
  Filled 2021-12-09 (×2): qty 1

## 2021-12-09 NOTE — Progress Notes (Signed)
PROGRESS NOTE    [name]  MNS:[id number] DOB: [birth year] DOA: 11/21/2021 PCP: [name], NP  Angel Brewer is a 56 y.o. female with medical history significant for HFpEF (last EF 60-65% October 2022), history of recurrent PE on Eliquis, pulmonary hypertension, HTN, HLD, OSA/OHS on CPAP, morbid obesity who presented to the ED for evaluation of dyspnea.  Chest x-ray noted pulmonary edema 5/11 initial admission to hospitalist service.  Started on BiPAP and diuresed successfully for 5 L. 5/13 increasing somnolence and hypercarbia new since admission. 5/15 am intubated. Discussed trach with son who wanted to give her a chance of extubation before proceeding straight to trach 5/16 failed SBT for tachypnea 5/17 failed SBT for tachypnea 5/18 failed SBT for rapid and shallow breathing despite high PS 5/19 tracheostomy placed 5/24 Placed on trach collar mid morning of 5/23 and has remained on ATC since  5/25 stable on TC Transferred from PCCM to [facility] service 5/27  Subjective: -Feels okay, no events overnight, eating more, tolerating tube feeds at night  Assessment and Plan:  Acute on chronic hypoxic and hypercapnic respiratory failure Untreated OSA/OHS, morbid obesity -Treated with BiPAP initially, eventually intubated 5/15, failed spontaneous breathing trials and underwent tracheostomy on 5/19 -Stable on trach collar since 5/24, PCCM following -Continue diuretics as well, wean O2, PCCM to consider cuffless and or downsizing trach when O2 needs better -Increase mobility -PT OT following, SNF recommended  Dysphagia -SLP following, started D3 diet 5/25 -Yesterday changed tube feeds to nightly, continue D3 diet -Wean off tube feeds tomorrow  Acute on chronic heart failure with preserved ejection fraction (HFpEF) ([place]) -Echo this admission noted preserved EF and RV function -Continue IV Lasix, she is 7.4 L negative, weight down 28 pounds -Diamox was added 5/23 -Continue  low-dose Aldactone -Wean O2  History of pulmonary embolus (PE) History of recurrent PE.  Continue Eliquis.  Hyperlipidemia Continue atorvastatin.  Essential hypertension Continue amlodipine.  Type 2 diabetes mellitus -CBGs are stable, continue sliding scale insulin  DVT prophylaxis: Apixaban Code Status: Full code Family Communication: Discussed with patient detail, no family at bedside Disposition Plan: To be determined  Consultants:  PCCM  Procedures: Tracheostomy 5/19  Antimicrobials:    Objective: Vitals:   12/09/21 0022 12/09/21 0525 12/09/21 0725 12/09/21 0856  BP: 116/78 129/78 133/67 (!) 122/107  Pulse: 86 84 83 83  Resp: _0 (!) 21  Temp: 98.2 F (36.8 C) 98 F (36.7 C) 97.9 F (36.6 C)   TempSrc: Oral Oral Oral   SpO2: 94% 94% 98% 94%  Weight: (!) 187.9 kg (!) 183.9 kg    Height:        Intake/Output Summary (Last 24 hours) at 12/09/2021 0933 Last data filed at 12/09/2021 0914 Gross per 24 hour  Intake 180 ml  Output 1750 ml  Net -1570 ml   Filed Weights   12/08/21 0342 12/09/21 0022 12/09/21 0525  Weight: (!) 185 kg (!) 187.9 kg (!) 183.9 kg    Examination:  General exam: Morbidly obese chronically ill female sitting up in bed, AAOx3, no distress HEENT: Tracheostomy with trach collar, NG tube noted CVS: S1-S2, regular rhythm Lungs: Scattered upper airway sounds Abdomen: Soft, obese, nontender, bowel sounds present Extremities: Trace edema  Skin: No rashes Psychiatry:  Mood & affect appropriate.     Data Reviewed:   CBC: Recent Labs  Lab 12/03/21 0415 12/06/21 0547 12/07/21 0444 12/08/21 0515  WBC 6.3 10.5 9.4 10.2  HGB 11.1* 12.1 11.7* 11.7*  HCT  37.3 40.1 38.2 38.7  MCV 96.9 95.9 93.6 93.5  PLT 269 344 325 893   Basic Metabolic Panel: Recent Labs  Lab 12/03/21 0415 12/03/21 1335 12/04/21 0405 12/05/21 0315 12/06/21 0547 12/07/21 0444 12/08/21 0515 12/09/21 0431  NA 139   < > 135 135 137 136 136 134*  K 4.0    < > 3.7 3.8 4.3 3.7 3.8 4.3  CL 89*   < > 86* 89* 89* 87* 87* 86*  CO2 40*   < > 39* 37* 38* 39* 39* 38*  GLUCOSE 95   < > 122* 126* 134* 114* 114* 123*  BUN 38*   < > 51* 66* 68* 65* 64* 64*  CREATININE 0.93   < > 1.13* 1.05* 0.86 0.84 0.86 1.01*  CALCIUM 9.9   < > 10.3 9.9 9.9 9.8 9.9 9.8  MG 2.3  --  2.5* 2.8* 2.8* 2.6*  --   --   PHOS 5.7*  --  6.7* 5.3* 4.9* 4.8*  --   --    < > = values in this interval not displayed.   GFR: Estimated Creatinine Clearance: 108.5 mL/min (A) (by C-G formula based on SCr of 1.01 mg/dL (H)). Liver Function Tests: No results for input(s): AST, ALT, ALKPHOS, BILITOT, PROT, ALBUMIN in the last 168 hours. No results for input(s): LIPASE, AMYLASE in the last 168 hours. No results for input(s): AMMONIA in the last 168 hours. Coagulation Profile: No results for input(s): INR, PROTIME in the last 168 hours. Cardiac Enzymes: No results for input(s): CKTOTAL, CKMB, CKMBINDEX, TROPONINI in the last 168 hours. BNP (last 3 results) No results for input(s): PROBNP in the last 8760 hours. HbA1C: No results for input(s): HGBA1C in the last 72 hours. CBG: Recent Labs  Lab 12/03/21 2256 12/04/21 0414 12/04/21 0746 12/04/21 1144 12/04/21 1623  GLUCAP 121* 116* 139* 137* 118*   Lipid Profile: No results for input(s): CHOL, HDL, LDLCALC, TRIG, CHOLHDL, LDLDIRECT in the last 72 hours. Thyroid Function Tests: No results for input(s): TSH, T4TOTAL, FREET4, T3FREE, THYROIDAB in the last 72 hours. Anemia Panel: No results for input(s): VITAMINB12, FOLATE, FERRITIN, TIBC, IRON, RETICCTPCT in the last 72 hours. Urine analysis:    Component Value Date/Time   COLORURINE AMBER (A) 11/23/2019 0220   APPEARANCEUR CLOUDY (A) 11/23/2019 0220   LABSPEC 1.027 11/23/2019 0220   PHURINE 5.0 11/23/2019 0220   GLUCOSEU NEGATIVE 11/23/2019 0220   HGBUR NEGATIVE 11/23/2019 0220   BILIRUBINUR NEGATIVE 11/23/2019 0220   KETONESUR NEGATIVE 11/23/2019 0220   PROTEINUR 100  (A) 11/23/2019 0220   NITRITE NEGATIVE 11/23/2019 0220   LEUKOCYTESUR NEGATIVE 11/23/2019 0220   Sepsis Labs: _0 (procalcitonin:4,lacticidven:4)  ) Recent Results (from the past 240 hour(s))  Expectorated Sputum Assessment w Gram Stain, Rflx to Resp Cult     Status: None   Collection Time: 11/30/21  9:16 AM   Specimen: Sputum  Result Value Ref Range Status   Specimen Description SPUTUM  Final   Special Requests NONE  Final   Sputum evaluation   Final    THIS SPECIMEN IS ACCEPTABLE FOR SPUTUM CULTURE Performed at [facility], [place] [address], [place], [place] [address]    Report Status 11/30/2021 FINAL  Final  Culture, Respiratory w Gram Stain     Status: None   Collection Time: 11/30/21  9:16 AM   Specimen: SPU  Result Value Ref Range Status   Specimen Description SPUTUM  Final   Special Requests NONE Reflexed from [id number]  Final   Gram Stain   Final    ABUNDANT WBC PRESENT, PREDOMINANTLY PMN NO ORGANISMS SEEN    Culture   Final    Normal respiratory flora-no Staph aureus or Pseudomonas seen Performed at [facility] [address] [place], [place] [address]    Report Status 12/02/2021 FINAL  Final     Radiology Studies: No results found.   Scheduled Meds:  apixaban  5 mg Per Tube BID   atorvastatin  20 mg Per NG tube Daily   chlorhexidine  15 mL Mouth Rinse BID   Chlorhexidine Gluconate Cloth  6 each Topical Q0600   clonazepam  0.25 mg Per Tube BID   feeding supplement  237 mL Oral BID BM   furosemide  60 mg Intravenous Q12H   mouth rinse  15 mL Mouth Rinse q12n4p   pantoprazole sodium  40 mg Per Tube Daily   potassium chloride  20 mEq Per Tube BID   sodium chloride flush  10-40 mL Intracatheter Q12H   sodium chloride flush  3 mL Intravenous Q12H   spironolactone  12.5 mg Oral Daily   Continuous Infusions:  sodium chloride Stopped (12/04/21 1440)   feeding supplement (VITAL AF 1.2 CAL) Stopped (12/09/21 0533)     LOS: 18 days     Time spent: 41mn    PDomenic Polite MD [facility]   12/09/2021, 9:33 AM

## 2021-12-09 NOTE — Progress Notes (Signed)
Occupational Therapy Treatment Patient Details Name: Angel Brewer MRN: [id number] DOB: 05-07-1966 Today's Date: 12/09/2021   History of present illness Pt is a 56 y.o. female admitted 11/21/21 with SOB; workup for acute on chronic HF. Transfer to ICU secondary to hypercarbic respiratory failure with increased obtundation. ETT 5/15; s/p trach 5/19; trach collar 5/23. PMH includes HTN, DVT, recurrent PE on Eliquis, morbid obesity, wrist ligament reconstruction.   OT comments  Patient received in supine and wanted to attempt sitting on EOB. Patient was max assist to get BLE off side of bed and  but was unable to raise trunk due to weakness and back pain.  Patient was max assist +2 to return to supine. Patient performed rolling in bed to address bathing, using bed pan, and dressing with assistance of 2 to roll and assistance of another to assist with cleaning during sidelying. Patient is motivated towards improving but continues to be limited by weakness and pain. Acute OT to continue to follow.    Recommendations for follow up therapy are one component of a multi-disciplinary discharge planning process, led by the attending physician.  Recommendations may be updated based on patient status, additional functional criteria and insurance authorization.    Follow Up Recommendations  Skilled nursing-short term rehab (<3 hours/day)    Assistance Recommended at Discharge Frequent or constant Supervision/Assistance  Patient can return home with the following  Two people to help with walking and/or transfers;Two people to help with bathing/dressing/bathroom;Assistance with cooking/housework;Direct supervision/assist for medications management;Assist for transportation;Help with stairs or ramp for entrance   Equipment Recommendations  Other (comment) (TBD)    Recommendations for Other Services      Precautions / Restrictions Precautions Precautions: Fall;Other (comment) Precaution Comments: trach,  cortrak Restrictions Weight Bearing Restrictions: No       Mobility Bed Mobility Overal bed mobility: Needs Assistance Bed Mobility: Rolling Rolling: Max assist, +2 for physical assistance, +2 for safety/equipment   Supine to sit: Max assist     General bed mobility comments: attempted to get to EOB but had increased back pain and required assitance to return to supine. Rolling performed in bed to address bathing    Transfers                         Balance                                           ADL either performed or assessed with clinical judgement   ADL Overall ADL's : Needs assistance/impaired     Grooming: Wash/dry face;Set up;Bed level   Upper Body Bathing: Maximal assistance;Bed level   Lower Body Bathing: Total assistance;Bed level Lower Body Bathing Details (indicate cue type and reason): assisted with rolling Upper Body Dressing : Moderate assistance Upper Body Dressing Details (indicate cue type and reason): changed gown Lower Body Dressing: Total assistance;Bed level Lower Body Dressing Details (indicate cue type and reason): donned socks               General ADL Comments: Patient assisted with rolling for self care with assitance from nursing tech and nurse    Extremity/Trunk Assessment Upper Extremity Assessment RUE Deficits / Details: grasp 4/5, elbow AROM WFL, shoulder AROM WFL limited by lines - able to bring grooming items to face RUE Sensation: WNL LUE Deficits / Details: grasp 4/5, elbow AROM,  shoulder AROM WFL limited lines LUE Sensation: WNL            Vision       Perception     Praxis      Cognition Arousal/Alertness: Awake/alert Behavior During Therapy: WFL for tasks assessed/performed Overall Cognitive Status: Within Functional Limits for tasks assessed                                 General Comments: had complaints of back pain, limiting participation         Exercises      Shoulder Instructions       General Comments      Pertinent Vitals/ Pain       Pain Assessment Pain Assessment: Faces Faces Pain Scale: Hurts [place] more Pain Location: back and neck; trach site Pain Descriptors / Indicators: Cramping, Discomfort, Grimacing Pain Intervention(s): Limited activity within patient's tolerance, Monitored during session, Repositioned  Home Living                                          Prior Functioning/Environment              Frequency  Min 2X/week        Progress Toward Goals  OT Goals(current goals can now be found in the care plan section)  Progress towards OT goals: Progressing toward goals  Acute Rehab OT Goals Patient Stated Goal: get better OT Goal Formulation: With patient Time For Goal Achievement: 12/14/21 Potential to Achieve Goals: Good ADL Goals Pt Will Perform Grooming: with set-up;bed level Pt Will Perform Upper Body Bathing: with adaptive equipment;sitting;with mod assist Pt Will Perform Lower Body Bathing: with mod assist;sitting/lateral leans;with adaptive equipment Pt Will Transfer to Toilet: with max assist;with +2 assist;squat pivot transfer;bedside commode Pt Will Perform Toileting - Clothing Manipulation and hygiene: with max assist Pt/caregiver will Perform Home Exercise Program: Both right and left upper extremity;Increased strength;With theraband;With written HEP provided Additional ADL Goal #1: Pt will perform bed mobility to come EOB as precursor to participation in ADL at [place] Discharge plan remains appropriate;Frequency remains appropriate    Co-evaluation                 AM-PAC OT "6 Clicks" Daily Activity     Outcome Measure   Help from another person eating meals?: A [place] Help from another person taking care of personal grooming?: A [place] Help from another person toileting, which includes using toliet, bedpan, or urinal?: Total Help from  another person bathing (including washing, rinsing, drying)?: A Lot Help from another person to put on and taking off regular upper body clothing?: A Lot Help from another person to put on and taking off regular lower body clothing?: Total 6 Click Score: 12    End of Session Equipment Utilized During Treatment:  (10L O2 40% FiO2 via trach collar)  OT Visit Diagnosis: Other abnormalities of gait and mobility (R26.89);Unsteadiness on feet (R26.81);Muscle weakness (generalized) (M62.81);Other (comment)   Activity Tolerance Patient limited by pain   Patient Left in bed;with call bell/phone within reach;with bed alarm set   Nurse Communication Mobility status        Time: 0940-7680 OT Time Calculation (min): 41 min  Charges: OT General Charges $OT Visit: 1 Visit OT Treatments $Self Care/Home Management :  23-37 mins $Therapeutic Activity: 8-22 mins  Lodema Hong, OTA Acute Rehabilitation Services  Pager [phone] Office [phone]   Trixie Dredge 12/09/2021, 10:56 AM

## 2021-12-09 NOTE — Progress Notes (Signed)
Speech Language Pathology Treatment: Dysphagia;[name] Speaking valve  Patient Details Name: Angel Brewer MRN: [id number] DOB: 15-Apr-1966 Today's Date: 12/09/2021 Time: 9800-1239 SLP Time Calculation (min) (ACUTE ONLY): 14 min  Assessment / Plan / Recommendation Clinical Impression  Pt with PMV donned on arrival with recommendations to wear during all waking hours, remove during sleep. Engaged in conversation with 100% intelligibility, adequate respiration and phonation reciprocity. HR 83, RR 21, SpO2 94%. Removed valve without back pressure appreciated.  Tube feedings are now nocturnal and pt commented on large amount of food on the tray. Discussed goal of increased appetite now feeds not running during the day with pt understanding. Encouraged to eat as much as she is able. Coughing prior to po's and states when she gets anxious she tends to cough. Coughing noted during session with regular texture, thin liquids with pt again referring to her anxiety versus sensation of airway intrusion. She masticated regular timely and efficiently and pt stated she had no preference between chopped meats versus regular. Therapist upgraded to regular, continue thin liquids, pills with thin, PMV in place with meals, meds. Continue ST for diet efficiency and teach pt to donn/doff valve if able.    HPI HPI: Angel Brewer is a 56 y/o female who presented on 5/11 with SOB, admitted with acute on chronic heart failure. Had to be tx to ICU secondary to hypercarbic respiratory failure with increased obtundation, intubated 5/15, s/p trach 11/29/21, weaned from vent and tolerating TC trials 5/23. PMH includes HTN, recurrent PE on Eliquis, morbid obesity, DVT, radial ORIF and wrist ligament reconstruction.      SLP Plan         Recommendations for follow up therapy are one component of a multi-disciplinary discharge planning process, led by the attending physician.  Recommendations may be updated based on patient  status, additional functional criteria and insurance authorization.    Recommendations  Diet recommendations: Regular;Thin liquid Liquids provided via: Cup;Straw Medication Administration: Whole meds with liquid Supervision: Patient able to self feed;Staff to assist with self feeding Compensations: Slow rate;Small sips/bites Postural Changes and/or Swallow Maneuvers: Seated upright 90 degrees;Upright 30-60 min after meal      Patient may use Passy-Muir Speech Valve: During all waking hours (remove during sleep) PMSV Supervision: Intermittent MD: Please consider changing trach tube to : Cuffless         Oral Care Recommendations: Oral care BID Follow Up Recommendations: Skilled nursing-short term rehab (<3 hours/day) Assistance recommended at discharge: Intermittent Supervision/Assistance SLP Visit Diagnosis: Aphonia (R49.1);Dysphagia, unspecified (R13.10)           Houston Siren  12/09/2021, 10:26 AM

## 2021-12-10 DIAGNOSIS — J9621 Acute and chronic respiratory failure with hypoxia: Secondary | ICD-10-CM | POA: Diagnosis not present

## 2021-12-10 DIAGNOSIS — B370 Candidal stomatitis: Secondary | ICD-10-CM | POA: Diagnosis not present

## 2021-12-10 DIAGNOSIS — I272 Pulmonary hypertension, unspecified: Secondary | ICD-10-CM | POA: Diagnosis not present

## 2021-12-10 DIAGNOSIS — E871 Hypo-osmolality and hyponatremia: Secondary | ICD-10-CM | POA: Diagnosis not present

## 2021-12-10 DIAGNOSIS — I110 Hypertensive heart disease with heart failure: Secondary | ICD-10-CM | POA: Diagnosis not present

## 2021-12-10 DIAGNOSIS — G9341 Metabolic encephalopathy: Secondary | ICD-10-CM | POA: Diagnosis not present

## 2021-12-10 DIAGNOSIS — I5033 Acute on chronic diastolic (congestive) heart failure: Secondary | ICD-10-CM | POA: Diagnosis not present

## 2021-12-10 DIAGNOSIS — J9622 Acute and chronic respiratory failure with hypercapnia: Secondary | ICD-10-CM | POA: Diagnosis not present

## 2021-12-10 DIAGNOSIS — E8729 Other acidosis: Secondary | ICD-10-CM | POA: Diagnosis not present

## 2021-12-10 DIAGNOSIS — Z20822 Contact with and (suspected) exposure to covid-19: Secondary | ICD-10-CM | POA: Diagnosis not present

## 2021-12-10 DIAGNOSIS — E11649 Type 2 diabetes mellitus with hypoglycemia without coma: Secondary | ICD-10-CM | POA: Diagnosis not present

## 2021-12-10 DIAGNOSIS — E662 Morbid (severe) obesity with alveolar hypoventilation: Secondary | ICD-10-CM | POA: Diagnosis not present

## 2021-12-10 LAB — BASIC METABOLIC PANEL
Anion gap: 11 (ref 5–15)
BUN: 58 mg/dL — ABNORMAL HIGH (ref 6–20)
CO2: 36 mmol/L — ABNORMAL HIGH (ref 22–32)
Calcium: 9.7 mg/dL (ref 8.9–10.3)
Chloride: 84 mmol/L — ABNORMAL LOW (ref 98–111)
Creatinine, Ser: 0.98 mg/dL (ref 0.44–1.00)
GFR, Estimated: >60 mL/min (ref >60)
Glucose, Bld: 111 mg/dL — ABNORMAL HIGH (ref 70–99)
Potassium: 4.5 mmol/L (ref 3.5–5.1)
Sodium: 131 mmol/L — ABNORMAL LOW (ref 135–145)

## 2021-12-10 MED ORDER — FUROSEMIDE 10 MG/ML IJ SOLN
20.0000 mg | Freq: Two times a day (BID) | INTRAMUSCULAR | Status: AC
Start: 2021-12-10 — End: 2021-12-10
  Administered 2021-12-10 (×2): 20 mg via INTRAVENOUS
  Filled 2021-12-10: qty 2

## 2021-12-10 MED ORDER — SENNOSIDES-DOCUSATE SODIUM 8.6-50 MG PO TABS
1.0000 | ORAL_TABLET | Freq: Two times a day (BID) | ORAL | Status: DC
  Filled 2021-12-10 (×2): qty 1

## 2021-12-10 NOTE — Progress Notes (Signed)
Heart Failure Stewardship Pharmacist Progress Note   PCP: [name], NP PCP-Cardiologist: None    HPI:  56 yo female with PMH of OSA on CPAP, diastolic CHF, HLD, and DVT 2/2 pregnancy 2017, and pulmonary HTN.  Presented to ED with progressive dyspnea at rest and on exertion, orthopnea with LEE on exam.  CXR with moderate interstitial lung markings indicative of moderate/severe CHF and pulmonary edema.  Echo 05/12 with LVEF 60-65%, mild LVH, normal RV - unchanged since 04/2021 Echo.  Adherent to PTA furosemide.  Started on IV diuretics.  Became less responsive and developed respiratory acidosis despite being on bipap and was then intubated on 5/13.  Ongoing discussions for extubation vs trach - failed SBT 05/16, 05/17, 05/18. Trach placed 5/19.   Patient reported dysuria and vaginal itching 05/24 since foley removal and was started on Ceftriaxone x5d for empiric UTI and fluconazole x2 doses for possible yeast infection; afebrile, WBC wnl.    Repeat CXR on 05/25 showed moderate interstitial pulmonary edema (L > R alveolar opacity), largely unchanged from previous imaging.  Weaned off tube feeds 05/30 and is tolerating PO diet so far.   Current HF Medications: Diuretic: furosemide 60 mg IV q12h Aldosterone Antagonist: spironolactone 12.5 mg daily  Prior to admission HF Medications: Diuretic: furosemide 40 mg daily  Pertinent Lab Values: Serum creatinine 0.98 (baseline <1), BUN 58 (down), Potassium 4.5, Sodium 134 > 131, BNP 139.2, Magnesium 2.6 (05/27)  Vital Signs: Weight: 405 > 418 lbs; (admission weight: 433.4 lbs)  05/07/2021 discharge weight: 401 lb Blood pressure: labile - 100-30/60-80s Heart rate: 70-80s I/O: -1.4 L yesterday; cumulative net - 3.169 L  Medication Assistance / Insurance Benefits Check: Does the patient have prescription insurance?  Yes Type of insurance plan: Managed Medicaid - Entresto/SGLT2i require [place]  Outpatient Pharmacy:  Prior to admission  outpatient [facility] Is the patient willing to use [facility] at discharge? Pending Is the patient willing to transition their outpatient pharmacy to utilize a [facility] outpatient pharmacy?   Pending    Assessment: 1. Acute on chronic diastolic CHF (LVEF 94-76%). NYHA class IV symptoms. - Agree with reducing Lasix IV 60 mg q12h - Continue spironolactone 12.5 mg daily - No beta-blocker - still with significant fluid on board - Off amlodipine - Further GDMT optimization previously limited with trach/cortrak status    Plan: 1) Medication changes recommended at this time: - Reduce Lasix to 40 mg IV q12h - still significant fluid on board and uop starting to slow - Recheck magnesium tomorrow - Consider SGLT2i pending patient discussion - no h/o UTIs in the past.  Reported dysuria symptoms likely due to foley removal as she had no other symptoms, fever and WBC was wnl.   2) Patient assistance: - None pending   3)  Education  - Patient has been educated on current HF medications and potential additions to HF medication regimen - Patient verbalizes understanding that over the next few months, these medication doses may change and more medications may be added to optimize HF regimen - Patient has been educated on basic disease state pathophysiology and goals of therapy  Laurey Arrow, PharmD PGY1 Pharmacy Resident 12/10/2021  12:28 PM  Please check [email or web address] for unit-specific pharmacy phone numbers.

## 2021-12-10 NOTE — Plan of Care (Signed)
  Problem: Education: Goal: Ability to demonstrate management of disease process will improve Outcome: Progressing   Problem: Activity: Goal: Capacity to carry out activities will improve Outcome: Progressing   Problem: Cardiac: Goal: Ability to achieve and maintain adequate cardiopulmonary perfusion will improve Outcome: Progressing   Problem: Clinical Measurements: Goal: Respiratory complications will improve Outcome: Progressing   Problem: Safety: Goal: Ability to remain free from injury will improve Outcome: Progressing

## 2021-12-10 NOTE — Progress Notes (Signed)
PROGRESS NOTE    Angel Brewer  UKG:[id number] DOB: 09/26/1965 DOA: 11/21/2021 PCP: [name], NP  Angel Brewer is a 56 y.o. female with medical history significant for HFpEF (last EF 60-65% October 2022), history of recurrent PE on Eliquis, pulmonary hypertension, HTN, HLD, OSA/OHS on CPAP, morbid obesity who presented to the ED for evaluation of dyspnea.  Chest x-ray noted pulmonary edema 5/11 initial admission to hospitalist service.  Started on BiPAP and diuresed successfully for 5 L. 5/13 increasing somnolence and hypercarbia new since admission. 5/15 am intubated. Discussed trach with son who wanted to give her a chance of extubation before proceeding straight to trach 5/16 failed SBT for tachypnea 5/17 failed SBT for tachypnea 5/18 failed SBT for rapid and shallow breathing despite high PS 5/19 tracheostomy placed 5/24 Placed on trach collar mid morning of 5/23 and has remained on ATC since  5/25 stable on TC Transferred from PCCM to [facility] service 5/27  Subjective: -Had abdominal cramps last night, BM yesterday, improved after that  Assessment and Plan:  Acute on chronic hypoxic and hypercapnic respiratory failure Untreated OSA/OHS, morbid obesity -Treated with BiPAP initially, eventually intubated 5/15, failed spontaneous breathing trials and underwent tracheostomy on 5/19 -Stable on trach collar since 5/24, PCCM following -Mild downtrend in sodium noted, will cut down Lasix dose today -Continue to wean O2, PCCM to consider cuffless and or downsizing trach when O2 needs better -Increase mobility -PT OT following, ?  LTAC  Dysphagia -SLP following, started D3 diet 5/25 -Tolerating p.o. diet, will discontinue tube feeds and NG tube  Acute on chronic heart failure with preserved ejection fraction (HFpEF) ([place]) -Echo this admission noted preserved EF and RV function She is 7 L negative, weight fluctuating as of yesterday was down 28 pounds -Continue Lasix, will  decrease dose to 20 Mg twice daily today, mild downtrend in sodium noted -Continue Aldactone  History of pulmonary embolus (PE) History of recurrent PE.  Continue Eliquis.  Hyperlipidemia Continue atorvastatin.  Essential hypertension Continue amlodipine.  Type 2 diabetes mellitus -CBGs are stable, continue sliding scale insulin  DVT prophylaxis: Apixaban Code Status: Full code Family Communication: Discussed with patient detail, no family at bedside Disposition Plan: To be determined  Consultants:  PCCM  Procedures: Tracheostomy 5/19  Antimicrobials:    Objective: Vitals:   12/10/21 0446 12/10/21 0809 12/10/21 0818 12/10/21 1058  BP: 98/66  99/61 109/86  Pulse: 69 91 73 79  Resp: 16 (!) 24 (!) 23 18  Temp: 98.6 F (37 C)  98.4 F (36.9 C) 98.9 F (37.2 C)  TempSrc: Oral  Oral Oral  SpO2: 95% 94% 95% 98%  Weight: (!) 190 kg     Height:        Intake/Output Summary (Last 24 hours) at 12/10/2021 1108 Last data filed at 12/10/2021 0902 Gross per 24 hour  Intake 1656.64 ml  Output 851 ml  Net 805.64 ml   Filed Weights   12/09/21 0022 12/09/21 0525 12/10/21 0446  Weight: (!) 187.9 kg (!) 183.9 kg (!) 190 kg    Examination:  General exam: Morbidly obese chronically ill female sitting up in bed, AAOx3, no distress HEENT: Tracheostomy with trach collar, NG tube noted CVS: S1-S2, regular rhythm Lungs: Few scattered upper airway sounds Abdomen: Soft, obese, nontender, bowel sounds present Extremities: Trace edema  Skin: No rashes Psychiatry:  Mood & affect appropriate.     Data Reviewed:   CBC: Recent Labs  Lab 12/06/21 0547 12/07/21 0444 12/08/21 0515  WBC  10.5 9.4 10.2  HGB 12.1 11.7* 11.7*  HCT 40.1 38.2 38.7  MCV 95.9 93.6 93.5  PLT 344 325 628   Basic Metabolic Panel: Recent Labs  Lab 12/04/21 0405 12/05/21 0315 12/06/21 0547 12/07/21 0444 12/08/21 0515 12/09/21 0431 12/10/21 0532  NA 135 135 137 136 136 134* 131*  K 3.7 3.8 4.3  3.7 3.8 4.3 4.5  CL 86* 89* 89* 87* 87* 86* 84*  CO2 39* 37* 38* 39* 39* 38* 36*  GLUCOSE 122* 126* 134* 114* 114* 123* 111*  BUN 51* 66* 68* 65* 64* 64* 58*  CREATININE 1.13* 1.05* 0.86 0.84 0.86 1.01* 0.98  CALCIUM 10.3 9.9 9.9 9.8 9.9 9.8 9.7  MG 2.5* 2.8* 2.8* 2.6*  --   --   --   PHOS 6.7* 5.3* 4.9* 4.8*  --   --   --    GFR: Estimated Creatinine Clearance: 114.3 mL/min (by C-G formula based on SCr of 0.98 mg/dL). Liver Function Tests: No results for input(s): AST, ALT, ALKPHOS, BILITOT, PROT, ALBUMIN in the last 168 hours. No results for input(s): LIPASE, AMYLASE in the last 168 hours. No results for input(s): AMMONIA in the last 168 hours. Coagulation Profile: No results for input(s): INR, PROTIME in the last 168 hours. Cardiac Enzymes: No results for input(s): CKTOTAL, CKMB, CKMBINDEX, TROPONINI in the last 168 hours. BNP (last 3 results) No results for input(s): PROBNP in the last 8760 hours. HbA1C: No results for input(s): HGBA1C in the last 72 hours. CBG: Recent Labs  Lab 12/03/21 2256 12/04/21 0414 12/04/21 0746 12/04/21 1144 12/04/21 1623  GLUCAP 121* 116* 139* 137* 118*   Lipid Profile: No results for input(s): CHOL, HDL, LDLCALC, TRIG, CHOLHDL, LDLDIRECT in the last 72 hours. Thyroid Function Tests: No results for input(s): TSH, T4TOTAL, FREET4, T3FREE, THYROIDAB in the last 72 hours. Anemia Panel: No results for input(s): VITAMINB12, FOLATE, FERRITIN, TIBC, IRON, RETICCTPCT in the last 72 hours. Urine analysis:    Component Value Date/Time   COLORURINE AMBER (A) 11/23/2019 0220   APPEARANCEUR CLOUDY (A) 11/23/2019 0220   LABSPEC 1.027 11/23/2019 0220   PHURINE 5.0 11/23/2019 0220   GLUCOSEU NEGATIVE 11/23/2019 0220   HGBUR NEGATIVE 11/23/2019 0220   BILIRUBINUR NEGATIVE 11/23/2019 0220   KETONESUR NEGATIVE 11/23/2019 0220   PROTEINUR 100 (A) 11/23/2019 0220   NITRITE NEGATIVE 11/23/2019 0220   LEUKOCYTESUR NEGATIVE 11/23/2019 0220   Sepsis  Labs: _0 (procalcitonin:4,lacticidven:4)  ) No results found for this or any previous visit (from the past 240 hour(s)).    Radiology Studies: No results found.   Scheduled Meds:  apixaban  5 mg Per Tube BID   atorvastatin  20 mg Per NG tube Daily   chlorhexidine  15 mL Mouth Rinse BID   Chlorhexidine Gluconate Cloth  6 each Topical Q0600   clonazepam  0.25 mg Per Tube BID   feeding supplement  237 mL Oral BID BM   furosemide  20 mg Intravenous BID   mouth rinse  15 mL Mouth Rinse q12n4p   pantoprazole sodium  40 mg Per Tube Daily   sodium chloride flush  10-40 mL Intracatheter Q12H   sodium chloride flush  3 mL Intravenous Q12H   spironolactone  12.5 mg Oral Daily   Continuous Infusions:  sodium chloride Stopped (12/04/21 1440)   feeding supplement (VITAL AF 1.2 CAL) 1,000 mL (12/09/21 2153)     LOS: 19 days    Time spent: 66mn    PDomenic Polite MD [facility]  12/10/2021, 11:08 AM

## 2021-12-10 NOTE — Progress Notes (Signed)
Slight ST segment elevation per CCMD on the monitor. Patient denied chest pain, asleep. Informed Reece Levy, MD with order made and carried out - trop. Will monitor.

## 2021-12-10 NOTE — Progress Notes (Signed)
   NAMEElanie Brewer, MRN:  [id number], DOB:  [birth year], LOS: 13 ADMISSION DATE:  11/21/2021, CONSULTATION DATE: 11/23/2021 REFERRING MD:  Benson Norway, CHIEF COMPLAINT: Obtundation  History of Present Illness:  56 year old woman who was admitted with increasing shortness of breath.  Now in hypercarbic respiratory failure with increasing obtundation.  Unable to provide history at this time.  Spoke with niece who reports increasing shortness of breath over the last few days.  No other associated symptoms.  Prior admissions for similar but has never been intubated.  Initially no hypercarbia.  Now PCO2 in the 70s.  Was initially responding to questions this morning but is no longer doing so. She has not been using CPAP for several years now has her machine has broken down and she is awaiting a repeat sleep study.  Pertinent  Medical History   Past Medical History:  Diagnosis Date   DVT (deep vein thrombosis) in pregnancy    RLE DVT 02/2016   Dyspnea    Morbid obesity (HCC)    PE (pulmonary embolism) 02/29/2016   Sleep apnea    uses a cpap    Significant Hospital Events: Including procedures, antibiotic start and stop dates in addition to other pertinent events   5/11 initial admission to hospitalist service.  Started on BiPAP and diuresed successfully for 5 L. 5/13 increasing somnolence and hypercarbia new since admission. 5/15 am intubated. Discussed trach with son who wanted to give her a chance of extubation before proceeding straight to trach 5/16 failed SBT for tachypnea 5/17 failed SBT for tachypnea 5/18 failed SBT for rapid and shallow breathing despite high PS 5/19 tracheostomy placed 5/24 Placed on trach collar mid morning of 5/23 and has remained on ATC since  5/25 Remains on ATC this am, continues to have secretions but appears to be managing them well   Interim History / Subjective:  Using PMV up in chair position in bed trying to eat breakfast. Occasional  cough  Objective   Blood pressure 109/86, pulse 77, temperature 98.9 F (37.2 C), temperature source Oral, resp. rate 20, height _0  (1.702 m), weight (!) 190 kg, last menstrual period 11/29/2015, SpO2 98 %.    FiO2 (%):  [40 %-60 %] 40 %   Intake/Output Summary (Last 24 hours) at 12/10/2021 1613 Last data filed at 12/10/2021 1539 Gross per 24 hour  Intake 1634.64 ml  Output 701 ml  Net 933.64 ml   Filed Weights   12/09/21 0022 12/09/21 0525 12/10/21 0446  Weight: (!) 187.9 kg (!) 183.9 kg (!) 190 kg    Examination: Does not appear to be in distress Passy-Muir valve in place On trach collar Moist oral mucosa Moves all 4 ext Clear breath sounds  Continues to diurese okay, electrolytes okay  Assessment & Plan:   Acute respiratory failure with hypoxia and hypercapnia Obstructive sleep apnea/obesity hypoventilation syndrome-tracheostomy tube in place History of pulmonary embolus acute on Heart failure with preserved ejection fraction Hyperlipidemia Type 2 diabetes  Continue trach collar trials Continue Lasix Continue Eliquis Continue statins  PCCM will continue to follow  Sherrilyn Rist, MD [facility] [place] [facility] Pager: See Shea Evans

## 2021-12-10 NOTE — Progress Notes (Signed)
Changed patients H2O bottle on trach collar.

## 2021-12-10 NOTE — Progress Notes (Signed)
Physical Therapy Treatment Patient Details Name: Angel Brewer MRN: [id number] DOB: 10/09/1965 Today's Date: 12/10/2021   History of Present Illness Pt is a 56 y.o. female admitted 11/21/21 with SOB; workup for acute on chronic HF. Transfer to ICU secondary to hypercarbic respiratory failure with increased obtundation. ETT 5/15; s/p trach 5/19; trach collar 5/23. PMH includes HTN, DVT, recurrent PE on Eliquis, morbid obesity, wrist ligament reconstruction.    PT Comments    Patient is highly motivated and made good progress with therapy today. She tolerated ~28 minutes of partial WB on KREG tilt bed for LE strengthening with therapeutic rest between exercises sets. Pt Progressed tilt angle from 15*>30*>45*>55* throughout session. She was able to march bil LE for functional pre-gait weight shifting in 55* angle (110kg). Pt c/o slight knee pain and back pain but not limited ability to complete tasks. Pt completed ball toss as well at 45* and 55* angles to reduce UE support and encourage greater WB through LE's. VSS throughout session with HR in 70-80's bpm and pt on 40% FiO2 via trach. Pt using yonker for secretions when tilting into flat position when adjust bed. She will continue to benefit from skilled PT interventions to address impairments. Continue to recommend SNF for follow up rehab at this time and progress as able in acute setting.   Tilt: (weight bearing) 15* - 0 kg 30* - 24 kg 45* - 77 kg 55* - 110 kg   Recommendations for follow up therapy are one component of a multi-disciplinary discharge planning process, led by the attending physician.  Recommendations may be updated based on patient status, additional functional criteria and insurance authorization.  Follow Up Recommendations  Skilled nursing-short term rehab (<3 hours/day)     Assistance Recommended at Discharge Frequent or constant Supervision/Assistance  Patient can return home with the following Two people to help with  walking and/or transfers;Two people to help with bathing/dressing/bathroom;Help with stairs or ramp for entrance;Assist for transportation;Direct supervision/assist for financial management;Assistance with Education officer, environmental (measurements PT);Wheelchair cushion (measurements PT);Hospital bed;Rolling walker (2 wheels) (will update pending progress)    Recommendations for Other Services       Precautions / Restrictions Precautions Precautions: Fall;Other (comment) Precaution Comments: trach, cortrak Restrictions Weight Bearing Restrictions: No     Mobility  Bed Mobility Overal bed mobility: Needs Assistance             General bed mobility comments: 2+ Max assist with bed pad and cues for pt to use Bil UE to assist with scooting superiorly in bed to reposition at EOS. Start Time: [phone] Angle: 55 degrees (15, 30, 45, 55) Total Minutes in Angle: 28 minutes Patient Response: Cooperative  Transfers                   General transfer comment: use of KREG tilt bed    Ambulation/Gait                   Stairs             Wheelchair Mobility    Modified Rankin (Stroke Patients Only)       Balance                                            Cognition Arousal/Alertness: Awake/alert Behavior During Therapy: WFL for tasks assessed/performed Overall Cognitive Status: Within  Functional Limits for tasks assessed                                 General Comments: pt c/o mild back pain, overall feeling good. pt's daughter facetime while pt tilted and shared picture of her baby (sonogram). pt's voice clear with PMV.        Exercises General Exercises - Lower Extremity Quad Sets: AROM, Both, 20 reps (2x 10 reps in 30* and 10x10 reps in 45*) Hip Flexion/Marching: Both, 20 reps (10 reps at 45* and 10 reps at 55*) Toe Raises: Both, 15 reps Heel Raises: Both, 15 reps Mini-Sqauts: Both,  10 reps (2x10 reps at 45*and 1x10 reps at 55*) Other Exercises Other Exercises: ball toss in 45* and 55*, 15 reps tossing with SPT (multidirectional forward toss)    General Comments        Pertinent Vitals/Pain Pain Assessment Pain Assessment: Faces Faces Pain Scale: Hurts [place] more Pain Location: neck and back Pain Descriptors / Indicators: Cramping, Discomfort, Grimacing Pain Intervention(s): Limited activity within patient's tolerance, Monitored during session, Repositioned    Home Living                          Prior Function            PT Goals (current goals can now be found in the care plan section) Acute Rehab PT Goals Patient Stated Goal: get back to work PT Goal Formulation: With patient Time For Goal Achievement: 12/14/21 Potential to Achieve Goals: Fair Progress towards PT goals: Progressing toward goals    Frequency    Min 2X/week      PT Plan Current plan remains appropriate    Co-evaluation              AM-PAC PT "6 Clicks" Mobility   Outcome Measure  Help needed turning from your back to your side while in a flat bed without using bedrails?: Total Help needed moving from lying on your back to sitting on the side of a flat bed without using bedrails?: Total Help needed moving to and from a bed to a chair (including a wheelchair)?: Total Help needed standing up from a chair using your arms (e.g., wheelchair or bedside chair)?: Total Help needed to walk in hospital room?: Total Help needed climbing 3-5 steps with a railing? : Total 6 Click Score: 6    End of Session Equipment Utilized During Treatment: Oxygen (Trach collar at 40% FiO2) Activity Tolerance: Patient tolerated treatment well Patient left: in bed;with call bell/phone within reach Nurse Communication: Mobility status PT Visit Diagnosis: Muscle weakness (generalized) (M62.81);Pain Pain - part of body:  (stomach/abdomen)     Time: 5750-5183 PT Time Calculation  (min) (ACUTE ONLY): 47 min  Charges:  $Therapeutic Exercise: 23-37 mins $Therapeutic Activity: 8-22 mins                     Verner Mould, DPT Acute Rehabilitation Services Office [phone] Pager [phone]  12/10/21 11:04 AM

## 2021-12-11 DIAGNOSIS — E662 Morbid (severe) obesity with alveolar hypoventilation: Secondary | ICD-10-CM | POA: Diagnosis not present

## 2021-12-11 DIAGNOSIS — E785 Hyperlipidemia, unspecified: Secondary | ICD-10-CM

## 2021-12-11 DIAGNOSIS — I10 Essential (primary) hypertension: Secondary | ICD-10-CM

## 2021-12-11 DIAGNOSIS — Z86711 Personal history of pulmonary embolism: Secondary | ICD-10-CM

## 2021-12-11 DIAGNOSIS — G9341 Metabolic encephalopathy: Secondary | ICD-10-CM | POA: Diagnosis not present

## 2021-12-11 DIAGNOSIS — I5033 Acute on chronic diastolic (congestive) heart failure: Secondary | ICD-10-CM | POA: Diagnosis not present

## 2021-12-11 DIAGNOSIS — E871 Hypo-osmolality and hyponatremia: Secondary | ICD-10-CM

## 2021-12-11 LAB — BASIC METABOLIC PANEL
Anion gap: 8 (ref 5–15)
BUN: 56 mg/dL — ABNORMAL HIGH (ref 6–20)
CO2: 40 mmol/L — ABNORMAL HIGH (ref 22–32)
Calcium: 9.6 mg/dL (ref 8.9–10.3)
Chloride: 86 mmol/L — ABNORMAL LOW (ref 98–111)
Creatinine, Ser: 0.89 mg/dL (ref 0.44–1.00)
GFR, Estimated: >60 mL/min (ref >60)
Glucose, Bld: 102 mg/dL — ABNORMAL HIGH (ref 70–99)
Potassium: 4.3 mmol/L (ref 3.5–5.1)
Sodium: 134 mmol/L — ABNORMAL LOW (ref 135–145)

## 2021-12-11 LAB — CBC
HCT: 37.5 % (ref 36.0–46.0)
Hemoglobin: 11.4 g/dL — ABNORMAL LOW (ref 12.0–15.0)
MCH: 28.6 pg (ref 26.0–34.0)
MCHC: 30.4 g/dL (ref 30.0–36.0)
MCV: 94 fL (ref 80.0–100.0)
Platelets: 294 10*3/uL (ref 150–400)
RBC: 3.99 MIL/uL (ref 3.87–5.11)
RDW: 14.6 % (ref 11.5–15.5)
WBC: 11.2 10*3/uL — ABNORMAL HIGH (ref 4.0–10.5)
nRBC: 0 % (ref 0.0–0.2)

## 2021-12-11 LAB — MAGNESIUM: Magnesium: 2.8 mg/dL — ABNORMAL HIGH (ref 1.7–2.4)

## 2021-12-11 LAB — TROPONIN I (HIGH SENSITIVITY)
Troponin I (High Sensitivity): 23 ng/L — ABNORMAL HIGH (ref <18)
Troponin I (High Sensitivity): 23 ng/L — ABNORMAL HIGH (ref <18)

## 2021-12-11 MED ORDER — PANTOPRAZOLE SODIUM 40 MG PO TBEC
40.0000 mg | DELAYED_RELEASE_TABLET | Freq: Every day | ORAL | Status: DC
  Administered 2021-12-12 – 2022-01-08 (×28): 40 mg via ORAL
  Filled 2021-12-11 (×28): qty 1

## 2021-12-11 MED ORDER — DAPAGLIFLOZIN PROPANEDIOL 10 MG PO TABS
10.0000 mg | ORAL_TABLET | Freq: Every day | ORAL | Status: DC
  Administered 2021-12-11 – 2022-01-08 (×29): 10 mg via ORAL
  Filled 2021-12-11 (×29): qty 1

## 2021-12-11 MED ORDER — PROSOURCE PLUS PO LIQD
30.0000 mL | Freq: Three times a day (TID) | ORAL | Status: DC
  Filled 2021-12-11 (×10): qty 30

## 2021-12-11 MED ORDER — SENNOSIDES-DOCUSATE SODIUM 8.6-50 MG PO TABS
1.0000 | ORAL_TABLET | Freq: Two times a day (BID) | ORAL | Status: DC
  Administered 2021-12-15 – 2021-12-25 (×3): 1 via ORAL
  Filled 2021-12-11 (×27): qty 1

## 2021-12-11 MED ORDER — ONDANSETRON HCL 4 MG/2ML IJ SOLN
4.0000 mg | Freq: Four times a day (QID) | INTRAMUSCULAR | Status: DC | PRN
  Administered 2021-12-13: 4 mg via INTRAVENOUS
  Filled 2021-12-11 (×2): qty 2

## 2021-12-11 MED ORDER — ACETAMINOPHEN 325 MG PO TABS: 650.0000 mg | ORAL_TABLET | Freq: Four times a day (QID) | ORAL | Status: DC | PRN

## 2021-12-11 MED ORDER — CLONAZEPAM 0.25 MG PO TBDP
0.2500 mg | ORAL_TABLET | Freq: Two times a day (BID) | ORAL | Status: DC
  Administered 2021-12-11 – 2022-01-08 (×56): 0.25 mg via ORAL
  Filled 2021-12-11 (×56): qty 1

## 2021-12-11 MED ORDER — ACETAMINOPHEN 650 MG RE SUPP: 650.0000 mg | Freq: Four times a day (QID) | RECTAL | Status: DC | PRN

## 2021-12-11 MED ORDER — ADULT MULTIVITAMIN W/MINERALS CH
1.0000 | ORAL_TABLET | Freq: Every day | ORAL | Status: DC
  Administered 2021-12-11 – 2021-12-25 (×5): 1 via ORAL
  Filled 2021-12-11 (×19): qty 1

## 2021-12-11 MED ORDER — ONDANSETRON HCL 4 MG PO TABS
4.0000 mg | ORAL_TABLET | Freq: Four times a day (QID) | ORAL | Status: DC | PRN
  Administered 2021-12-23: 4 mg via ORAL
  Filled 2021-12-11: qty 1

## 2021-12-11 MED ORDER — OXYCODONE HCL 5 MG PO TABS: 5.0000 mg | ORAL_TABLET | Freq: Four times a day (QID) | ORAL | Status: DC | PRN

## 2021-12-11 MED ORDER — APIXABAN 5 MG PO TABS
5.0000 mg | ORAL_TABLET | Freq: Two times a day (BID) | ORAL | Status: DC
  Administered 2021-12-11 – 2022-01-08 (×56): 5 mg via ORAL
  Filled 2021-12-11 (×57): qty 1

## 2021-12-11 MED ORDER — PANTOPRAZOLE 2 MG/ML SUSPENSION: 40.0000 mg | Freq: Every day | ORAL | Status: DC

## 2021-12-11 MED ORDER — POLYETHYLENE GLYCOL 3350 17 G PO PACK: 17.0000 g | PACK | Freq: Every day | ORAL | Status: DC | PRN

## 2021-12-11 NOTE — Progress Notes (Signed)
Heart Failure Stewardship Pharmacist Progress Note   PCP: [name], NP PCP-Cardiologist: None    HPI:  56 yo female with PMH of OSA on CPAP, diastolic CHF, HLD, and DVT 2/2 pregnancy 2017, and pulmonary HTN.  Presented to ED with progressive dyspnea at rest and on exertion, orthopnea with LEE on exam.  CXR with moderate interstitial lung markings indicative of moderate/severe CHF and pulmonary edema.  Echo 05/12 with LVEF 60-65%, mild LVH, normal RV - unchanged since 04/2021 Echo.  Adherent to PTA furosemide.  Started on IV diuretics.  Became less responsive and developed respiratory acidosis despite being on bipap and was then intubated on 5/13.  Ongoing discussions for extubation vs trach - failed SBT 05/16, 05/17, 05/18. Trach placed 5/19.   Patient reported dysuria and vaginal itching 05/24 since foley removal and was started on Ceftriaxone x5d for empiric UTI and fluconazole x2 doses for possible yeast infection; afebrile, WBC wnl.    Repeat CXR on 05/25 showed moderate interstitial pulmonary edema (L > R alveolar opacity), largely unchanged from previous imaging.  Weaned off tube feeds 05/30 and is tolerating PO diet so far.  IV diuresis with Lasix was reduced from 60 mg q12h to 20 mg x2 yesterday in response to down trending Na (134 > 131).   Current HF Medications: Diuretic: furosemide 20 mg IV x2 yesterday Aldosterone Antagonist: spironolactone 12.5 mg daily  Prior to admission HF Medications: Diuretic: furosemide 40 mg daily  Pertinent Lab Values: Serum creatinine 0.89 (baseline <1), BUN 56, Potassium 4.3, Sodium 131>134, BNP 139.2, Magnesium 2.8  Vital Signs: Weight: 405 > 418 > 410 lbs; (admission weight: 433.4 lbs)  05/07/2021 discharge weight: 401 lb Blood pressure: labile - 100-30s/60-80s Heart rate: 70s I/O: -701 mL yesterday; cumulative net - 3.65 L  Medication Assistance / Insurance Benefits Check: Does the patient have prescription insurance?  Yes Type of  insurance plan: Managed Medicaid - Entresto/SGLT2i require PA  Outpatient Pharmacy:  [facility] Is the patient willing to use [facility] at discharge? Yes Is the patient willing to transition their outpatient pharmacy to utilize a [facility] outpatient pharmacy?   Pending  Assessment: 1. Acute on chronic diastolic CHF (LVEF 66-06%). NYHA class IV symptoms. Only 701 cc uop after IV Lasix 20 mg x2, O2 sats dropped to low 90s after weaning down to 28% FiO2.  Weights not accurate. - No diuretics ordered for today.     - Continue spironolactone 12.5 mg daily - No beta-blocker - still with significant fluid on board - Off amlodipine - Further GDMT optimization previously limited by trach/cortrak status.  Now tolerating PO diet.     Plan: 1) Medication changes recommended at this time: - Start Lasix IV 40 mg q12h - Start Farxiga 10 mg daily - no h/o UTIs in the past.  Reported dysuria symptoms likely due to foley removal as she had no other symptoms, fever and WBC was wnl.   2) Patient assistance: - None pending - start Farxiga PA if initiated  3)  Education  - Patient has been educated on current HF medications and potential additions to HF medication regimen - Patient verbalizes understanding that over the next few months, these medication doses may change and more medications may be added to optimize HF regimen - Patient has been educated on basic disease state pathophysiology and goals of therapy  Laurey Arrow, PharmD PGY1 Pharmacy Resident 12/11/2021  7:26 AM  Please check [email or web address] for unit-specific  pharmacy phone numbers.

## 2021-12-11 NOTE — Assessment & Plan Note (Signed)
Stable renal function with serum cr at 0,89 with K at 4,0 and Na at 134. Bicarbonate at 40.   Plan to continue close follow up on renal function and electrolytes in 48 hrs.

## 2021-12-11 NOTE — Progress Notes (Signed)
Mobility Specialist Progress Note   12/11/21 1720  Mobility  Activity Dangled on edge of bed  Level of Assistance +2 (takes two people) (Mod A)  Assistive Device  (HHA)  Activity Response Tolerated well  $Mobility charge 1 Mobility   Received in bed cramping in LLQ but agreeable and eager in progressing there mobility. Required +2 assist to get pt supine <> EOB for trunk weakness + verbal cues for hand placement and positioning. Pt danfgling for ~35mns demonstrating core strength and then requesting to get back in bed. Mod A to get pt's LE back in and repositioned in bed. Left w/ call bell in reach and needs met.  JHolland FallingMobility Specialist Phone Number [phone]

## 2021-12-11 NOTE — Assessment & Plan Note (Signed)
Calculated BMI is 64

## 2021-12-11 NOTE — Assessment & Plan Note (Addendum)
Acute hypoxemic and hypercapnic respiratory failure due to acute cardiogenic pulmonary edema.   Echocardiogram with preserved LV systolic function 60 to 65%, RV systolic function is preserved, no significant valvular disease.   Urine output is 1,025 ml She is on 8 L per per trach collar with 02 saturation 97%. . Systolic blood pressure 139 to 119 mmHg   Medical therapy with spironolactone and dapagliflozin.  Continue to hold on furosemide for now.

## 2021-12-11 NOTE — Progress Notes (Addendum)
Speech Language Pathology Treatment: Dysphagia;[name] Speaking valve  Patient Details Name: Angel Brewer MRN: [id number] DOB: 1965-08-28 Today's Date: 12/11/2021 Time: 1200-1214 SLP Time Calculation (min) (ACUTE ONLY): 14 min  Assessment / Plan / Recommendation Clinical Impression  Pt eating lunch with PMV donned on arrival and had delayed coughing. Cough was dry and did not appear to be from aspiration. Last observation she also had a delayed dry cough. She stated she doesn't food/liquid is not going well but she gets sometimes anxious when people come in the room and coughs. She is afebrile and lung status has not changed. Continue regular/thin.  SLP verbally and visually (with mirror) guided pt to donn/doff speaking valve which she was able to do with repeated attempts. Pt has cuffed trach and should not do independently until trach is changed to cuffless. Cognitively she is able to check that her cuff is deflated however needs more practice with this. Vocal quality clear/normal and vitals within normal range.    HPI HPI: Angel Brewer is a 56 y/o female who presented on 5/11 with SOB, admitted with acute on chronic heart failure. Had to be tx to ICU secondary to hypercarbic respiratory failure with increased obtundation, intubated 5/15, s/p trach 11/29/21, weaned from vent and tolerating TC trials 5/23. PMH includes HTN, recurrent PE on Eliquis, morbid obesity, DVT, radial ORIF and wrist ligament reconstruction.      SLP Plan  Continue with current plan of care      Recommendations for follow up therapy are one component of a multi-disciplinary discharge planning process, led by the attending physician.  Recommendations may be updated based on patient status, additional functional criteria and insurance authorization.    Recommendations  Diet recommendations: Regular;Thin liquid Liquids provided via: Straw Medication Administration: Whole meds with liquid Supervision: Patient able  to self feed (ensure she is sitting upright) Compensations: Slow rate;Small sips/bites Postural Changes and/or Swallow Maneuvers: Seated upright 90 degrees;Upright 30-60 min after meal      Patient may use Passy-Muir Speech Valve: During all waking hours (remove during sleep) PMSV Supervision: Intermittent MD: Please consider changing trach tube to : Cuffless         Oral Care Recommendations: Oral care BID Follow Up Recommendations: Skilled nursing-short term rehab (<3 hours/day) Assistance recommended at discharge: Intermittent Supervision/Assistance SLP Visit Diagnosis: Aphonia (R49.1);Dysphagia, unspecified (R13.10) Plan: Continue with current plan of care           Houston Siren  12/11/2021, 12:34 PM

## 2021-12-11 NOTE — Progress Notes (Addendum)
Progress Note   Patient: Angel Brewer DOB: Mar 02, 1966 DOA: 11/21/2021     20 DOS: the patient was seen and examined on 12/11/2021   Brief hospital course: Angel Brewer was admitted to the hospital with the working diagnosis of acute on chronic diastolic heart failure, complicated with acute hypoxemic respiratory failure.   Angel Brewer is a 56 y.o. female with medical history significant for HFpEF (last EF 60-65% October 2022), history of recurrent PE on Eliquis, pulmonary hypertension, HTN, HLD, OSA/OHS on CPAP, obesity class 3. Reported 2 days of worsening dyspnea, orthopnea and cough. On her initial physical examination her blood pressure was 144/83, HR 78, RR 34, 02 saturation 94% on 4 L/min per [place]. Distant breath sounds, with no wheezing, heart with S1 and S2 present and rhythmic, abdomen not distended, positive lower extremity edema.   Chest radiograph with cardiomegaly, bilateral interstitial infiltrates, congested hilar vasculature and cephalization of the vasculature.   5/11 initial admission to hospitalist service.  Started on BiPAP and diuresed successfully for 5 L. 5/13 increasing somnolence and hypercarbia new since admission. 5/15 am intubated. Discussed trach with son who wanted to give her a chance of extubation before proceeding straight to trach 5/16 failed SBT for tachypnea 5/17 failed SBT for tachypnea 5/18 failed SBT for rapid and shallow breathing despite high PS 5/19 tracheostomy placed 5/24 Placed on trach collar mid morning of 5/23 and has remained on ATC since  5/25 stable on TC  Transferred from PCCM to [facility] service 5/27  Assessment and Plan: * Acute on chronic heart failure with preserved ejection fraction (HFpEF) ([place]) Acute hypoxemic and hypercapnic respiratory failure due to acute cardiogenic pulmonary edema.   Echocardiogram with preserved LV systolic function 60 to 24%, RV systolic function is preserved, no significant valvular disease.    Urine output is 3000 ml With a negative fluid balance on -7,094 since admission.  She is on 5 L per per trach collar with 02 saturation 93 to 94%. Systolic blood pressure 097 to 123 mmHg   Plan to continue with spironolactone and add SGLT2i.  Continue holding on furosemide for now.  Continue blood pressure monitoring.      Obesity hypoventilation syndrome ([place]) Patient sp trach.  #6 Shiley trach, cuffed in place.  Continue trach care.   History of pulmonary embolus (PE) History of recurrent PE.  Continue Eliquis.  Acute metabolic encephalopathy Patient with improved mentation.  Today able to follow commands and answer questions. Continue neuro checks per unit protocol. Pt and Ot.   Essential hypertension Currently antihypertensive medications on hold.   Hyperlipidemia Continue atorvastatin.  Hyponatremia Renal function stable with serum cr at 0,89, K is 4,3 and serum bicarbonate at 20. Na is 134.  Plan to continue with spironolactone Hold on loop diuretic therapy for now.   Class 3 obesity (HCC) Calculated BMI is 64         Subjective: Patient with improved, dyspnea, tolerating well trach.   Physical Exam: Vitals:   12/11/21 0718 12/11/21 0809 12/11/21 1142 12/11/21 1145  BP: 128/88 106/66  123/79  Pulse: 77 72 80 78  Resp: 15 (!) _0 Temp: 97.7 F (36.5 C)     TempSrc: Oral     SpO2: 97% 97% 94% 93%  Weight:      Height:       Neurology awake and alert ENT with trach in place, one way valve in place Cardiovascular with S1 and S2 present and rhythmic No lower extremity  edema Respiratory with no wheezing or rales, on anterior auscultation  Abdomen is protuberant but not distended  Data Reviewed:    Family Communication: no family at the bedside   Disposition: Status is: Inpatient Remains inpatient appropriate because: respiratory failure   Planned Discharge Destination: Home  Author: Tawni Millers, MD 12/11/2021 2:36  PM  For on call review [email or web address].

## 2021-12-11 NOTE — Assessment & Plan Note (Signed)
Patient with improved mentation.  Today able to follow commands and answer questions. Continue neuro checks per unit protocol. Pt and Ot.   Continue with clonazepam for anxiety

## 2021-12-11 NOTE — Progress Notes (Signed)
Nutrition Follow-up  DOCUMENTATION CODES:   Obesity unspecified  INTERVENTION:  - Ensure Enlive po BID, each supplement provides 350 kcal and 20 grams of protein.  - 30 ml ProSource Plus TID, each supplement provides 100 kcals and 15 grams protein.   - MVI with minerals daily  NUTRITION DIAGNOSIS:   Inadequate oral intake related to inability to eat as evidenced by NPO status.  Ongoing  GOAL:   Patient will meet greater than or equal to 90% of their needs  - Addressing needs via meals and nutrition supplements  MONITOR:   Diet advancement, Vent status, Weight trends, Labs, I & O's, TF tolerance  REASON FOR ASSESSMENT:   Consult Enteral/tube feeding initiation and management  ASSESSMENT:   56 y.o. female presented to the ED with dyspnea. PMH includes COPD, HTN, and CHF. Pt admitted with acute on chronic heart failure and acute respiratory failure with hypoxia.  5/13 - intubated 5/14 - TF initiated 5/15 - Cortrak placed (tip post-pyloric) 5/19 - Trach placed 5/22 - Cortrak replaced 5/23 - placed on trach collar 5/25- FEES; diet adv to Dys 3/thin  5/29- diet adv to regular 5/30- TF d/c; Cortrak removed  Pt is pleasant. She states that since discontinuing Cortrak, she has had stomach cramping when eating meals so her intake has been sporadic. This morning she endorses eating 100% of her eggs and cheese. She continues to drink and tolerate Ensure at least twice a day. For lunch she was sent spaghetti with bread and reports that it was too heavy so she only ate the meat sauce and bread.   Meal completions: 05/30: 100%-breakfast, 95%-lunch, 5%-dinner 05/31: 100%-breakfast  Edema: non-pitting BUE, mild pitting BLE  Medications: peridex, protonix, senna  Labs: sodium 134, BUN 56, Mg 2.8  Diet Order:   Diet Order             Diet regular Room service appropriate? Yes; Fluid consistency: Thin  Diet effective now                   EDUCATION NEEDS:   Not  appropriate for education at this time  Skin:  Skin Assessment: Reviewed RN Assessment  Last BM:  5/31 (type 6)  Height:   Ht Readings from Last 1 Encounters:  12/06/21 5' 7" (1.702 m)    Weight:   Wt Readings from Last 1 Encounters:  12/11/21 (!) 186 kg    Ideal Body Weight:  63.6 kg  BMI:  Body mass index is 64.22 kg/m.  Estimated Nutritional Needs:   Kcal:  1600-1800 kcals  Protein:  140-175 g  Fluid:  >/= 1.6 L  Clayborne Dana, RDN, LDN Clinical Nutrition

## 2021-12-12 ENCOUNTER — Other Ambulatory Visit (HOSPITAL_COMMUNITY): Payer: Self-pay

## 2021-12-12 DIAGNOSIS — E662 Morbid (severe) obesity with alveolar hypoventilation: Secondary | ICD-10-CM | POA: Diagnosis not present

## 2021-12-12 DIAGNOSIS — E871 Hypo-osmolality and hyponatremia: Secondary | ICD-10-CM | POA: Diagnosis not present

## 2021-12-12 DIAGNOSIS — I5033 Acute on chronic diastolic (congestive) heart failure: Secondary | ICD-10-CM | POA: Diagnosis not present

## 2021-12-12 DIAGNOSIS — E785 Hyperlipidemia, unspecified: Secondary | ICD-10-CM | POA: Diagnosis not present

## 2021-12-12 DIAGNOSIS — Z86711 Personal history of pulmonary embolism: Secondary | ICD-10-CM | POA: Diagnosis not present

## 2021-12-12 DIAGNOSIS — I10 Essential (primary) hypertension: Secondary | ICD-10-CM | POA: Diagnosis not present

## 2021-12-12 DIAGNOSIS — G9341 Metabolic encephalopathy: Secondary | ICD-10-CM | POA: Diagnosis not present

## 2021-12-12 LAB — BASIC METABOLIC PANEL
Anion gap: 5 (ref 5–15)
BUN: 44 mg/dL — ABNORMAL HIGH (ref 6–20)
CO2: 40 mmol/L — ABNORMAL HIGH (ref 22–32)
Calcium: 9.6 mg/dL (ref 8.9–10.3)
Chloride: 89 mmol/L — ABNORMAL LOW (ref 98–111)
Creatinine, Ser: 0.89 mg/dL (ref 0.44–1.00)
GFR, Estimated: >60 mL/min (ref >60)
Glucose, Bld: 101 mg/dL — ABNORMAL HIGH (ref 70–99)
Potassium: 4 mmol/L (ref 3.5–5.1)
Sodium: 134 mmol/L — ABNORMAL LOW (ref 135–145)

## 2021-12-12 NOTE — Progress Notes (Signed)
Heart Failure Stewardship Pharmacist Progress Note   PCP: [name], NP PCP-Cardiologist: None    HPI:  56 yo female with PMH of OSA on CPAP, diastolic CHF, HLD, and DVT 2/2 pregnancy 2017, and pulmonary HTN.  Presented to ED with progressive dyspnea at rest and on exertion, orthopnea with LEE on exam.  CXR with moderate interstitial lung markings indicative of moderate/severe CHF and pulmonary edema.  Echo 05/12 with LVEF 60-65%, mild LVH, normal RV - unchanged since 04/2021 Echo.  Adherent to PTA furosemide.  Started on IV diuretics.  Became less responsive and developed respiratory acidosis despite being on bipap and was then intubated on 5/13.  Ongoing discussions for extubation vs trach - failed SBT 05/16, 05/17, 05/18. Trach placed 5/19.   Patient reported dysuria and vaginal itching 05/24 since foley removal and was started on Ceftriaxone x5d for empiric UTI and fluconazole x2 doses for possible yeast infection; afebrile, WBC wnl.    Repeat CXR on 05/25 showed moderate interstitial pulmonary edema (L > R alveolar opacity), largely unchanged from previous imaging.  Weaned off tube feeds 05/30 and is somewhat tolerating PO diet so far.   Current HF Medications: Aldosterone Antagonist: spironolactone 12.5 mg daily SGLT2i: Farxiga 10 mg daily  Prior to admission HF Medications: Diuretic: furosemide 40 mg daily  Pertinent Lab Values: Serum creatinine 0.89 (baseline <1), BUN 44 (down), Potassium 4.0, Sodium 134, BNP 139.2, Magnesium 2.8  Vital Signs: Weight: 409 lbs; (admission weight: 433.4 lbs)  05/07/2021 discharge weight: 401 lb Blood pressure: 110/70s Heart rate: 70-80s I/O: -1.3 L yesterday; cumulative net - 17.3 L  Medication Assistance / Insurance Benefits Check: Does the patient have prescription insurance?  Yes Type of insurance plan: Managed Medicaid - Entresto/SGLT2i require PA  Outpatient Pharmacy:  [facility] Is the patient  willing to use [facility] at discharge? Pending Is the patient willing to transition their outpatient pharmacy to utilize a [facility] outpatient pharmacy?   Pending    Assessment: 1. Acute on chronic diastolic CHF (LVEF 70-34%). NYHA class IV symptoms. - Off IV lasix - Continue spironolactone 12.5 mg daily - Continue Farxiga 10 mg daily - no h/o UTIs in the past.  Reported dysuria symptoms likely due to foley removal as she had no other symptoms, fever and WBC was wnl.  - No BB - Off amlodipine - Further GDMT optimization previously limited with trach/cortrak status    Plan: 1) Medication changes recommended at this time: - Agree with current regimen  2) Patient assistance: - Farxiga needs PA - will start today  3)  Education  - Patient has been educated on current HF medications and potential additions to HF medication regimen - Patient verbalizes understanding that over the next few months, these medication doses may change and more medications may be added to optimize HF regimen - Patient has been educated on basic disease state pathophysiology and goals of therapy  Kerby Nora, PharmD, BCPS Heart Failure Stewardship Pharmacist Phone [phone]  Please check [email or web address] for unit-specific pharmacy phone numbers.

## 2021-12-12 NOTE — Progress Notes (Signed)
Occupational Therapy Treatment Patient Details Name: Angel Brewer MRN: [id number] DOB: 07-30-1965 Today's Date: 12/12/2021   History of present illness Pt is a 56 y.o. female admitted 11/21/21 with SOB; workup for acute on chronic HF. Transfer to ICU secondary to hypercarbic respiratory failure with increased obtundation. ETT 5/15; s/p trach 5/19; trach collar 5/23. PMH includes HTN, DVT, recurrent PE on Eliquis, morbid obesity, wrist ligament reconstruction. (Simultaneous filing. User may not have seen previous data.)   OT comments  Patient making good gains with OT/PT treatment. Patient rolling better in bed for position of lift pad and transferred to recliner with lift. Patient performed 5 stands from recliner with mod assist of 2 with HHA and tolerated ~1 minute each stand. Patient is motivated towards therapy and has good potential for increased gains.    Recommendations for follow up therapy are one component of a multi-disciplinary discharge planning process, led by the attending physician.  Recommendations may be updated based on patient status, additional functional criteria and insurance authorization.    Follow Up Recommendations  Skilled nursing-short term rehab (<3 hours/day)    Assistance Recommended at Discharge Frequent or constant Supervision/Assistance  Patient can return home with the following  Two people to help with walking and/or transfers;Two people to help with bathing/dressing/bathroom;Assistance with cooking/housework;Direct supervision/assist for medications management;Assist for transportation;Help with stairs or ramp for entrance   Equipment Recommendations  Other (comment) (TBD)    Recommendations for Other Services      Precautions / Restrictions Precautions Precautions: Fall;Other (comment) (Simultaneous filing. User may not have seen previous data.) Precaution Comments: trach, cortrak (Simultaneous filing. User may not have seen previous  data.) Restrictions Weight Bearing Restrictions: No (Simultaneous filing. User may not have seen previous data.)       Mobility Bed Mobility Overal bed mobility: Needs Assistance (Simultaneous filing. User may not have seen previous data.) Bed Mobility: Rolling (Simultaneous filing. User may not have seen previous data.) Rolling: Mod assist (Simultaneous filing. User may not have seen previous data.)         General bed mobility comments: rolled in bed for lift pad placement (Simultaneous filing. User may not have seen previous data.)    Transfers Overall transfer level: Needs assistance (Simultaneous filing. User may not have seen previous data.) Equipment used: Ambulation equipment used (Simultaneous filing. User may not have seen previous data.) Transfers: Bed to chair/wheelchair/BSC             General transfer comment: Maxi lift used to transfer to recliner. 5 sit to stands performed from recliner with mod assist of 2 person hand held assist (Simultaneous filing. User may not have seen previous data.)     Balance Overall balance assessment: Needs assistance Sitting-balance support: Feet unsupported, Bilateral upper extremity supported   Sitting balance - Comments: sitting up in recliner. Able to scoot forward with min assists   Standing balance support: Bilateral upper extremity supported   Standing balance comment: performed 5 stands with mod assist of 2 with HHA                           ADL either performed or assessed with clinical judgement   ADL Overall ADL's : Needs assistance/impaired     Grooming: Wash/dry hands;Wash/dry face;Set up;Sitting Grooming Details (indicate cue type and reason): in recliner  Extremity/Trunk Assessment              Vision       Perception     Praxis      Cognition Arousal/Alertness: Awake/alert (Simultaneous filing. User may not have seen previous  data.) Behavior During Therapy: [facility] for tasks assessed/performed (Simultaneous filing. User may not have seen previous data.) Overall Cognitive Status: Within Functional Limits for tasks assessed (Simultaneous filing. User may not have seen previous data.)                                 General Comments: patient motivated towards therapy and getting out of bed.        Exercises Exercises: General Upper Extremity General Exercises - Upper Extremity Shoulder Flexion: AROM, Both, 10 reps, Supine Shoulder ABduction: AROM, Both, 10 reps, Supine    Shoulder Instructions       General Comments      Pertinent Vitals/ Pain       Pain Assessment Pain Assessment: Faces (Simultaneous filing. User may not have seen previous data.) Faces Pain Scale: Hurts Charlson more (Simultaneous filing. User may not have seen previous data.) Pain Location: stomach (Simultaneous filing. User may not have seen previous data.) Pain Descriptors / Indicators: Grimacing (Simultaneous filing. User may not have seen previous data.) Pain Intervention(s): Monitored during session, Repositioned (Simultaneous filing. User may not have seen previous data.)  Home Living                                          Prior Functioning/Environment              Frequency  Min 2X/week        Progress Toward Goals  OT Goals(current goals can now be found in the care plan section)  Progress towards OT goals: Progressing toward goals  Acute Rehab OT Goals Patient Stated Goal: get better OT Goal Formulation: With patient Time For Goal Achievement: 12/14/21 Potential to Achieve Goals: Good ADL Goals Pt Will Perform Grooming: with set-up;bed level Pt Will Perform Upper Body Bathing: with adaptive equipment;sitting;with mod assist Pt Will Perform Lower Body Bathing: with mod assist;sitting/lateral leans;with adaptive equipment Pt Will Transfer to Toilet: with max assist;with +2  assist;squat pivot transfer;bedside commode Pt Will Perform Toileting - Clothing Manipulation and hygiene: with max assist Pt/caregiver will Perform Home Exercise Program: Both right and left upper extremity;Increased strength;With theraband;With written HEP provided Additional ADL Goal #1: Pt will perform bed mobility to come EOB as precursor to participation in ADL at [place] Discharge plan remains appropriate;Frequency remains appropriate    Co-evaluation    PT/OT/SLP Co-Evaluation/Treatment: Yes Reason for Co-Treatment: For patient/therapist safety;To address functional/ADL transfers (Simultaneous filing. User may not have seen previous data.) PT goals addressed during session: Mobility/safety with mobility;Balance;Proper use of DME OT goals addressed during session: ADL's and self-care      AM-PAC OT "6 Clicks" Daily Activity     Outcome Measure   Help from another person eating meals?: A [place]inger Help from another person taking care of personal grooming?: A [place] Help from another person toileting, which includes using toliet, bedpan, or urinal?: Total Help from another person bathing (including washing, rinsing, drying)?: A Lot Help from another person to put on and taking off regular upper body clothing?: A Lot  Help from another person to put on and taking off regular lower body clothing?: Total 6 Click Score: 12    End of Session Equipment Utilized During Treatment: Oxygen;Other (comment) (maxi lift)  OT Visit Diagnosis: Other abnormalities of gait and mobility (R26.89);Unsteadiness on feet (R26.81);Muscle weakness (generalized) (M62.81);Other (comment)   Activity Tolerance Patient tolerated treatment well   Patient Left in chair;with call bell/phone within reach   Nurse Communication Mobility status        Time: 3779-3968 OT Time Calculation (min): 53 min  Charges: OT General Charges $OT Visit: 1 Visit OT Treatments $Self Care/Home Management : 8-22  mins $Therapeutic Activity: 8-22 mins  Lodema Hong, [place]  Pager [phone] Office [phone]   Trixie Dredge 12/12/2021, 12:37 PM

## 2021-12-12 NOTE — TOC Initial Note (Addendum)
Transition of Care [facility]) - Initial/Assessment Note    Patient Details  Name: Angel Brewer MRN: [id number] Date of Birth: 12/05/1965  [facility]) CM/SW Contact:    Marilu Favre, RN Phone Number: 12/12/2021, 1:10 PM  Clinical Narrative:                 Spoke to patient at bedside. PT recommendations SNF. NCM was told patient prefers top go home at discharge .   NCM discussed with patient. Right now patient is a two person assist.   Patient lives alone. Confirmed face sheet information.   NCM discussed PT note with patient. Patient hoping to improve mobility prior to discharge from hospital. PT recommending wheel chair, hospital bed and walker.   New trach barrier for SNF placement.   NCM will call respiratory so trach teaching can be started . Patient in agreement. Spoke to [facility] in respiratory   [facility] team will continue to follow for progression.   Discussed in [place] meeting with [facility] [place] [facility] supervisor and Director ,will see if patient is candidate for STAR program.    Lavonia Dana with PT does not currently have a spot for patient in [place] in respiratory will call niece to arrange education   Expected Discharge Plan:  (Home) Barriers to Discharge: Continued Medical Work up   Patient Goals and CMS Choice Patient states their goals for this hospitalization and ongoing recovery are:: to go home CMS Medicare.gov Compare Post Acute Care list provided to:: Patient Represenative (must comment) (Patients son Tyrell) Choice offered to / list presented to : Adult Children (Tyrell)  Expected Discharge Plan and Services Expected Discharge Plan:  (Home) In-house Referral: Clinical Social Work Discharge Planning Services: CM Consult   Living arrangements for the past 2 months: Single Family Home                                      Prior Living Arrangements/Services Living arrangements for the past 2 months: Single Family Home Lives with::  Self Patient language and need for interpreter reviewed:: Yes Do you feel safe going back to the place where you live?: Yes      Need for Family Participation in Patient Care: Yes (Comment) Care giver support system in place?: Yes (comment)   Criminal Activity/Legal Involvement Pertinent to Current Situation/Hospitalization: No - Comment as needed  Activities of Daily Living Home Assistive Devices/Equipment: Dentures (specify type), CPAP (Upper & lower dentures) ADL Screening (condition at time of admission) Patient's cognitive ability adequate to safely complete daily activities?: Yes Is the patient deaf or have difficulty hearing?: No Does the patient have difficulty seeing, even when wearing glasses/contacts?: No Does the patient have difficulty concentrating, remembering, or making decisions?: No Patient able to express need for assistance with ADLs?: Yes Does the patient have difficulty dressing or bathing?: No Independently performs ADLs?: Yes (appropriate for developmental age) Does the patient have difficulty walking or climbing stairs?: Yes Weakness of Legs: Both Weakness of Arms/Hands: None  Permission Sought/Granted Permission sought to share information with : Case Manager, Family Supports, Customer service manager Permission granted to share information with : No  Share Information with NAME: Due to current orientation CSW spoke with patients son Tyrell     Permission granted to share info w Relationship: son  Permission granted to share info w Contact Information: Tyrell [phone]  Emotional Assessment  Orientation: :  (intubated/trach) Alcohol / Substance Use: Not Applicable Psych Involvement: No (comment)  Admission diagnosis:  Morbid obesity ([place]) [E66.01] Pulmonary hypertension ([place]) [I27.20] Acute respiratory failure with hypoxia ([place]) [J96.01] Acute on chronic congestive heart failure, unspecified heart failure type ([place]) [I50.9] Acute on  chronic heart failure with preserved ejection fraction (HFpEF) ([place]) [I50.33] Patient Active Problem List   Diagnosis Date Noted   Hyponatremia 32/75/5623   Acute metabolic encephalopathy 92/15/1582   Acute on chronic heart failure with preserved ejection fraction (HFpEF) ([place]) 11/21/2021   History of pulmonary embolus (PE) 11/21/2021   Hyperlipidemia 11/21/2021   COPD (chronic obstructive pulmonary disease) ([place]) 05/08/2021   Obesity hypoventilation syndrome ([place]) 05/03/2021   OSA (obstructive sleep apnea) 08/18/2020   Chronic deep vein thrombosis (DVT) of femoral vein of left lower extremity ([place]) 02/09/2020   Chronic anticoagulation 02/09/2020   Right wrist fracture 06/14/2019   Chronic respiratory failure with hypoxia ([place]) 06/13/2019   Essential hypertension 08/25/2017   Class 3 obesity ([place]) 10/29/2016   Vitamin D deficiency 10/29/2016   PCP:  [name], NP Pharmacy:   [id number] [place]Phone: [phone]: [phone] [facility] [address]Phone: [phone]: [phone]    Social Determinants of Health (SDOH) Interventions    Readmission Risk Interventions    12/02/2019    1:47 PM  Readmission Risk Prevention Plan  Post Dischage Appt Complete  Medication Screening Complete  Transportation Screening Complete

## 2021-12-12 NOTE — Progress Notes (Addendum)
Progress Note   Patient: Angel Brewer JGO:[id number] DOB: 02-19-1966 DOA: 11/21/2021     21 DOS: the patient was seen and examined on 12/12/2021   Brief hospital course: Mrs. Delbridge was admitted to the hospital with the working diagnosis of acute on chronic diastolic heart failure, complicated with acute hypoxemic respiratory failure.   Deklynn Lipke is a 56 y.o. female with medical history significant for HFpEF (last EF 60-65% October 2022), history of recurrent PE on Eliquis, pulmonary hypertension, HTN, HLD, OSA/OHS on CPAP, obesity class 3. Reported 2 days of worsening dyspnea, orthopnea and cough. On her initial physical examination her blood pressure was 144/83, HR 78, RR 34, 02 saturation 94% on 4 L/min per [place]. Distant breath sounds, with no wheezing, heart with S1 and S2 present and rhythmic, abdomen not distended, positive lower extremity edema.   Chest radiograph with cardiomegaly, bilateral interstitial infiltrates, congested hilar vasculature and cephalization of the vasculature.   5/11 initial admission to hospitalist service.  Started on BiPAP and diuresed successfully for 5 L. 5/13 increasing somnolence and hypercarbia new since admission. 5/15 am intubated. Discussed trach with son who wanted to give her a chance of extubation before proceeding straight to trach 5/16 failed SBT for tachypnea 5/17 failed SBT for tachypnea 5/18 failed SBT for rapid and shallow breathing despite high PS 5/19 tracheostomy placed 5/24 Placed on trach collar mid morning of 5/23 and has remained on ATC since  5/25 stable on TC  Transferred from PCCM to [facility] service 5/27  Assessment and Plan: * Acute on chronic heart failure with preserved ejection fraction (HFpEF) ([place]) Acute hypoxemic and hypercapnic respiratory failure due to acute cardiogenic pulmonary edema.   Echocardiogram with preserved LV systolic function 60 to 55%, RV systolic function is preserved, no significant valvular disease.    Urine output is 1,850 ml She is on 5 L per per trach collar with 02 saturation 93 to 94%. Systolic blood pressure 974 to 123 mmHg   Continue with spironolactone and dapagliflozin   Continue to hold on furosemide for now.      Obesity hypoventilation syndrome ([place]) Patient sp trach.  #6 Shiley trach, cuffed in place.  Continue trach care.   History of pulmonary embolus (PE) History of recurrent PE.  Continue Eliquis.  Acute metabolic encephalopathy Patient with improved mentation.  Today able to follow commands and answer questions. Continue neuro checks per unit protocol. Pt and Ot.   Continue with clonazepam for anxiety  Essential hypertension Currently antihypertensive medications on hold.   Hyperlipidemia Continue atorvastatin.  Hyponatremia Stable renal function with serum cr at 0,89 with K at 4,0 and Na at 134. Bicarbonate at 40.   Plan to continue close follow up on renal function and electrolytes in 48 hrs.   Class 3 obesity (HCC) Calculated BMI is 64         Subjective: patient improvement in her dyspnea, continue to be very weak and deconditioned, today she is sitting on the chair at the side of the bed.   Physical Exam: Vitals:   12/12/21 0726 12/12/21 0749 12/12/21 1117 12/12/21 1147  BP: 107/87 107/87  (!) 105/59  Pulse: 76 78 89 86  Resp: 17 20 (!) 26 (!) 21  Temp: 97.8 F (36.6 C)   97.8 F (36.6 C)  TempSrc: Oral   Oral  SpO2: 90% 91% 93% 95%  Weight:      Height:       Neurology awake and alert ENT with trach in place.  One way valve in place Cardiovascular with S1 and S2 present and rhythmic Respiratory with no rales or wheezing Abdomen soft and not distended No lower extremity edema  Data Reviewed:    Family Communication: no family at the bedside   Disposition: Status is: Inpatient Remains inpatient appropriate because: pending placement   Planned Discharge Destination: Skilled nursing facility    Author: Tawni Millers, MD 12/12/2021 2:53 PM  For on call review [email or web address].

## 2021-12-12 NOTE — Progress Notes (Signed)
Heart Failure Patient Advocate Encounter   Received notification from [facility] Medicaid that prior authorization for [place] is required.   PA submitted on CoverMyMeds Key [id number] Status is pending   Will continue to follow.  Kerby Nora, PharmD, BCPS Heart Failure Stewardship Pharmacist Phone [phone]  Please check [email or web address] for unit-specific pharmacist phone numbers

## 2021-12-12 NOTE — Progress Notes (Signed)
Physical Therapy Treatment Patient Details Name: Angel Brewer MRN: [id number] DOB: July 16, 1965 Today's Date: 12/12/2021   History of Present Illness Pt is a 56 y.o. female admitted 11/21/21 with SOB; workup for acute on chronic HF. Transfer to ICU secondary to hypercarbic respiratory failure with increased obtundation. ETT 5/15; s/p trach 5/19; trach collar 5/23. PMH includes HTN, DVT, recurrent PE on Eliquis, morbid obesity, wrist ligament reconstruction.    PT Comments    PT asked to return to assist pt back to bed by nursing staff. The pt was agreeable, states she is sore from sitting up this afternoon. This afternoon, pt presents with increased pain and soreness in BLE and more limited strength, unable to stand despite maxA of 2 from recliner at this time. Maximove still used to return the pt to bed from the recliner due to inability to stand or take any pivotal steps this morning. Will continue to benefit from skilled PT to progress mobility. If she were to return home, the pt would need hoyer lift, hospital bed, and bariatric wheelchair.     Recommendations for follow up therapy are one component of a multi-disciplinary discharge planning process, led by the attending physician.  Recommendations may be updated based on patient status, additional functional criteria and insurance authorization.  Follow Up Recommendations  Skilled nursing-short term rehab (<3 hours/day)     Assistance Recommended at Discharge Frequent or constant Supervision/Assistance  Patient can return home with the following Two people to help with walking and/or transfers;Two people to help with bathing/dressing/bathroom;Help with stairs or ramp for entrance;Assist for transportation;Direct supervision/assist for financial management;Assistance with Education officer, environmental (measurements PT);Wheelchair cushion (measurements PT);Hospital bed;Rolling walker (2 wheels)    Recommendations  for Other Services       Precautions / Restrictions Precautions Precautions: Fall Precaution Comments: trach, PSMV Restrictions Weight Bearing Restrictions: No     Mobility  Bed Mobility Overal bed mobility: Needs Assistance Bed Mobility: Rolling Rolling: Mod assist         General bed mobility comments: pt needing modA to pull to roll each direction, use of bed rails. rolled in bed to remove maximove pad    Transfers Overall transfer level: Needs assistance Equipment used: Ambulation equipment used, 2 person hand held assist Transfers: Sit to/from Stand, Bed to chair/wheelchair/BSC Sit to Stand: +2 physical assistance, Max assist           General transfer comment: pt attempting to stand for placement of maximove pad, unable to tolerate despite maxA of 2, then used maximove to return to bed Transfer via Lift Equipment: Maximove  Ambulation/Gait             Pre-gait activities: standing wt shift and attempting to lift feet. x10 each direction with better movement of RLE         Balance Overall balance assessment: Needs assistance Sitting-balance support: Feet unsupported, Bilateral upper extremity supported   Sitting balance - Comments: sitting up in recliner. Able to scoot forward with min assists   Standing balance support: Bilateral upper extremity supported Standing balance-[name]: Poor Standing balance comment: can manage with as [place] as minA of 1 for static stance, modA of 2 to complete transfer                            Cognition Arousal/Alertness: Awake/alert Behavior During Therapy: WFL for tasks assessed/performed Overall Cognitive Status: Within Functional Limits for tasks assessed  General Comments: pt remains motivated with therapy           General Comments General comments (skin integrity, edema, etc.): VSS on trach collar      Pertinent Vitals/Pain Pain  Assessment Pain Assessment: Faces Faces Pain Scale: Hurts even more Pain Location: LE with pressure from maximove Pain Descriptors / Indicators: Discomfort, Grimacing Pain Intervention(s): Limited activity within patient's tolerance, Repositioned     PT Goals (current goals can now be found in the care plan section) Acute Rehab PT Goals Patient Stated Goal: get back to work PT Goal Formulation: With patient Time For Goal Achievement: 12/14/21 Potential to Achieve Goals: Fair Progress towards PT goals: Progressing toward goals    Frequency    Min 3X/week (increased as pt plans to d/c home)      PT Plan Current plan remains appropriate    Co-evaluation PT/OT/SLP Co-Evaluation/Treatment: Yes Reason for Co-Treatment: For patient/therapist safety;To address functional/ADL transfers PT goals addressed during session: Mobility/safety with mobility;Balance;Proper use of DME OT goals addressed during session: ADL's and self-care      AM-PAC PT "6 Clicks" Mobility   Outcome Measure  Help needed turning from your back to your side while in a flat bed without using bedrails?: A Lot Help needed moving from lying on your back to sitting on the side of a flat bed without using bedrails?: A Lot Help needed moving to and from a bed to a chair (including a wheelchair)?: Total Help needed standing up from a chair using your arms (e.g., wheelchair or bedside chair)?: A Lot Help needed to walk in hospital room?: Total Help needed climbing 3-5 steps with a railing? : Total 6 Click Score: 9    End of Session Equipment Utilized During Treatment: Oxygen;Gait belt Activity Tolerance: Patient tolerated treatment well Patient left: in bed;with call bell/phone within reach;with nursing/sitter in room Nurse Communication: Mobility status PT Visit Diagnosis: Muscle weakness (generalized) (M62.81);Pain     Time: 8719-5974 PT Time Calculation (min) (ACUTE ONLY): 10 min  Charges:  $Therapeutic  Exercise: 23-37 mins $Therapeutic Activity: 8-22 mins                     [place], PT, DPT   Acute Rehabilitation Department Pager #: [phone]   Angel Brewer 12/12/2021, 3:49 PM

## 2021-12-12 NOTE — Progress Notes (Signed)
Physical Therapy Treatment Patient Details Name: Angel Brewer MRN: [id number] DOB: July 16, 1965 Today's Date: 12/12/2021   History of Present Illness Pt is a 56 y.o. female admitted 11/21/21 with SOB; workup for acute on chronic HF. Transfer to ICU secondary to hypercarbic respiratory failure with increased obtundation. ETT 5/15; s/p trach 5/19; trach collar 5/23. PMH includes HTN, DVT, recurrent PE on Eliquis, morbid obesity, wrist ligament reconstruction.    PT Comments    The pt was agreeable to session with focus on progressing OOB mobility. The pt agreed to transfer to recliner with maximove despite discomfort, but was then able to complete x5 sit-stand transfers from the recliner for 5 seconds up to 2 min with modA of 2. The pt was able to initiate wt shift and attempts at stepping, but has significantly limited step clearance at this time. Will continue to benefit from skilled PT to progress functional strength, activity tolerance, and capacity for OOB mobility.     Recommendations for follow up therapy are one component of a multi-disciplinary discharge planning process, led by the attending physician.  Recommendations may be updated based on patient status, additional functional criteria and insurance authorization.  Follow Up Recommendations  Skilled nursing-short term rehab (<3 hours/day)     Assistance Recommended at Discharge Frequent or constant Supervision/Assistance  Patient can return home with the following Two people to help with walking and/or transfers;Two people to help with bathing/dressing/bathroom;Help with stairs or ramp for entrance;Assist for transportation;Direct supervision/assist for financial management;Assistance with Education officer, environmental (measurements PT);Wheelchair cushion (measurements PT);Hospital bed;Rolling walker (2 wheels)    Recommendations for Other Services       Precautions / Restrictions  Precautions Precautions: Fall Precaution Comments: trach, PSMV Restrictions Weight Bearing Restrictions: No     Mobility  Bed Mobility Overal bed mobility: Needs Assistance Bed Mobility: Rolling Rolling: Mod assist         General bed mobility comments: pt needing modA to pull to roll each direction, use of bed rails. rolled in bed to place maximove pad    Transfers Overall transfer level: Needs assistance Equipment used: Ambulation equipment used, 2 person hand held assist Transfers: Sit to/from Stand, Bed to chair/wheelchair/BSC Sit to Stand: Mod assist, +2 physical assistance           General transfer comment: maximove to lift pt to recliner at start of session, then completed sit-stands from recliner with Stem of 2 HHA. x5 with up to 2 min standing duration Transfer via Lift Equipment: Maximove  Ambulation/Gait             Pre-gait activities: standing wt shift and attempting to lift feet. x10 each direction with better movement of RLE        Balance Overall balance assessment: Needs assistance Sitting-balance support: Feet unsupported, Bilateral upper extremity supported   Sitting balance - Comments: sitting up in recliner. Able to scoot forward with min assists   Standing balance support: Bilateral upper extremity supported Standing balance-[name]: Poor Standing balance comment: can manage with as [place] as minA of 1 for static stance, modA of 2 to complete transfer                            Cognition Arousal/Alertness: Awake/alert Behavior During Therapy: WFL for tasks assessed/performed Overall Cognitive Status: Within Functional Limits for tasks assessed  General Comments: patient motivated towards therapy and getting out of bed.           General Comments General comments (skin integrity, edema, etc.): VSS on trach collar      Pertinent Vitals/Pain Pain Assessment Pain  Assessment: Faces Faces Pain Scale: Hurts even more Pain Location: LE with pressure from maximove Pain Descriptors / Indicators: Discomfort, Grimacing Pain Intervention(s): Limited activity within patient's tolerance, Monitored during session, Repositioned     PT Goals (current goals can now be found in the care plan section) Acute Rehab PT Goals Patient Stated Goal: get back to work PT Goal Formulation: With patient Time For Goal Achievement: 12/14/21 Potential to Achieve Goals: Fair Progress towards PT goals: Progressing toward goals    Frequency    Min 2X/week      PT Plan Current plan remains appropriate    Co-evaluation PT/OT/SLP Co-Evaluation/Treatment: Yes Reason for Co-Treatment: For patient/therapist safety;To address functional/ADL transfers PT goals addressed during session: Mobility/safety with mobility;Balance;Proper use of DME OT goals addressed during session: ADL's and self-care      AM-PAC PT "6 Clicks" Mobility   Outcome Measure  Help needed turning from your back to your side while in a flat bed without using bedrails?: A Lot Help needed moving from lying on your back to sitting on the side of a flat bed without using bedrails?: A Lot Help needed moving to and from a bed to a chair (including a wheelchair)?: Total Help needed standing up from a chair using your arms (e.g., wheelchair or bedside chair)?: A Lot Help needed to walk in hospital room?: Total Help needed climbing 3-5 steps with a railing? : Total 6 Click Score: 9    End of Session Equipment Utilized During Treatment: Oxygen;Gait belt Activity Tolerance: Patient tolerated treatment well Patient left: with call bell/phone within reach;in chair Nurse Communication: Mobility status PT Visit Diagnosis: Muscle weakness (generalized) (M62.81);Pain     Time: 7409-9278 PT Time Calculation (min) (ACUTE ONLY): 54 min  Charges:  $Therapeutic Exercise: 23-37 mins                     [place] Carbo, PT, DPT   Acute Rehabilitation Department Pager #: [phone]   Sandra Cockayne 12/12/2021, 1:54 PM

## 2021-12-13 DIAGNOSIS — E785 Hyperlipidemia, unspecified: Secondary | ICD-10-CM | POA: Diagnosis not present

## 2021-12-13 DIAGNOSIS — G9341 Metabolic encephalopathy: Secondary | ICD-10-CM | POA: Diagnosis not present

## 2021-12-13 DIAGNOSIS — I10 Essential (primary) hypertension: Secondary | ICD-10-CM | POA: Diagnosis not present

## 2021-12-13 DIAGNOSIS — E871 Hypo-osmolality and hyponatremia: Secondary | ICD-10-CM | POA: Diagnosis not present

## 2021-12-13 DIAGNOSIS — I5033 Acute on chronic diastolic (congestive) heart failure: Secondary | ICD-10-CM | POA: Diagnosis not present

## 2021-12-13 DIAGNOSIS — Z86711 Personal history of pulmonary embolism: Secondary | ICD-10-CM | POA: Diagnosis not present

## 2021-12-13 DIAGNOSIS — E662 Morbid (severe) obesity with alveolar hypoventilation: Secondary | ICD-10-CM | POA: Diagnosis not present

## 2021-12-13 NOTE — Progress Notes (Signed)
Pt called as she felt nauseous due to continuous coughing. Pt de-sated.  Pt refused to take off her PMSV  for communication at this time.  Educated to take valve off if she goes to sleep.

## 2021-12-13 NOTE — Progress Notes (Signed)
Physical Therapy Treatment Patient Details Name: Angel Brewer MRN: [id number] DOB: [birth year] Today's Date: 12/13/2021   History of Present Illness Pt is a 56 y.o. female admitted 11/21/21 with SOB; workup for acute on chronic HF. Transfer to ICU secondary to hypercarbic respiratory failure with increased obtundation. ETT 5/15; s/p trach 5/19; trach collar 5/23. Transfer to floor unit 5/27. PMH includes HTN, DVT, recurrent PE on Eliquis, morbid obesity, wrist ligament reconstruction.    PT Comments    Pt received in supine, agreeable to therapy session with good participation and tolerance for supine bed-level exercises and long sitting/seated balance tasks in [place] bed. Pt assisted to widen KREG bed for comfort 2/2 body habitus and expressed appreciation/feeling less claustrophobic for bed mobility activity afterward. Pt able to perform posterior supine scooting toward HOB using BUE overhead rails and BLE and minA and cues for sequencing and long sitting from chair posture in [place] bed with mod/maxA and side railings. Defer OOB due to lack of +2 assist and pt request for hygiene assist/wet bed, RN notified. Pt continues to benefit from PT services to progress toward functional mobility goals, plan to bring supine/seated UE/LE HEP handout to reinforce exercises next session and progress standing tolerance from chair.  Recommendations for follow up therapy are one component of a multi-disciplinary discharge planning process, led by the attending physician.  Recommendations may be updated based on patient status, additional functional criteria and insurance authorization.  Follow Up Recommendations  Skilled nursing-short term rehab (<3 hours/day) (family unable to provide 24/7 assist, would benefit from higher intensity rehab)     Assistance Recommended at Discharge Frequent or constant Supervision/Assistance  Patient can return home with the following Two people to help with walking and/or  transfers;Two people to help with bathing/dressing/bathroom;Help with stairs or ramp for entrance;Assist for transportation;Direct supervision/assist for financial management;Assistance with Education officer, environmental (measurements PT);Wheelchair cushion (measurements PT);Hospital bed;Rolling walker (2 wheels) (currently would need mechanical lift and all bariatric equipment)    Recommendations for Other Services       Precautions / Restrictions Precautions Precautions: Fall Precaution Comments: trach, PSMV Restrictions Weight Bearing Restrictions: No     Mobility  Bed Mobility Overal bed mobility: Needs Assistance Bed Mobility: Supine to Sit     Supine to sit: Max assist     General bed mobility comments: Supine to long sitting in [place] bed with bed in chair posture and feet on foot board, pt pulling on L rail and HHA for RUE support, mod to maxA trunk support to lift off back of bed; pt performs posterior supine scooting toward HOB with BLE/BUE assist using overhead rails with minA and cues           Balance Overall balance assessment: Needs assistance Sitting-balance support: Feet unsupported, Bilateral upper extremity supported (limited support at foot board due to body habitus) Sitting balance-[name]: Poor Sitting balance - Comments: mod/maxA for long sitting in bed from [place] bed chair posture with side rail and HHA support Postural control: Posterior lean     Standing balance comment: defer for pt safety due to lack of +2 assist              Cognition Arousal/Alertness: Awake/alert Behavior During Therapy: WFL for tasks assessed/performed Overall Cognitive Status: Within Functional Limits for tasks assessed         General Comments: pt remains motivated with therapy, reports less anxiety today than in some other sessions, especially once KREG bed widened for  her. PMSV donned throughout.        Exercises General  Exercises - Upper Extremity Elbow Flexion: AROM, Both, 10 reps, Seated Elbow Extension: AROM, Both, 10 reps, Seated General Exercises - Lower Extremity Quad Sets: AROM, Both, 10 reps, Supine Long Arc Quad: AROM, Both, 10 reps, Seated (KREG bed in chair posture) Heel Slides: AROM, Both, 15 reps, Supine Hip ABduction/ADduction: AROM, Both, 15 reps, Supine Straight Leg Raises: AROM, AAROM, Both, 10 reps, Supine (AA to increase ROM) Hip Flexion/Marching: AROM, Both, 20 reps, Seated (bed in chair posture) Other Exercises Other Exercises: lateral leans to L/R sides with elbow taps x10 reps Other Exercises: pulling forward to long sitting in [place] bed with B rails/HHA and mod/maxA and maintaining ~1 minutes x2 trials Other Exercises: seated BUE AROM: scap retraction x10 reps Other Exercises: supine AROM: posterior pelvic tilts x5 reps with 3-5 sec hold, hip ADduction with 3-5 sec hold x10 reps    General Comments General comments (skin integrity, edema, etc.): BP 127/85 (99) with KREG bed in chair posture; SpO2 95-98% on FiO2 28% via trach mask; PMSV donned not dyspneic      Pertinent Vitals/Pain Pain Assessment Pain Assessment: Faces Faces Pain Scale: Hurts [place] more Pain Location: back pain (increased in flat bed posture) Pain Descriptors / Indicators: Discomfort, Grimacing, Sore Pain Intervention(s): Monitored during session, Repositioned     PT Goals (current goals can now be found in the care plan section) Acute Rehab PT Goals Patient Stated Goal: get back to work PT Goal Formulation: With patient Time For Goal Achievement: 12/14/21 Progress towards PT goals: Progressing toward goals    Frequency    Min 3X/week (increased as pt plans to d/c home)      PT Plan Current plan remains appropriate       AM-PAC PT "6 Clicks" Mobility   Outcome Measure  Help needed turning from your back to your side while in a flat bed without using bedrails?: A Lot Help needed moving from  lying on your back to sitting on the side of a flat bed without using bedrails?: A Lot Help needed moving to and from a bed to a chair (including a wheelchair)?: Total Help needed standing up from a chair using your arms (e.g., wheelchair or bedside chair)?: Total (+2 maxA) Help needed to walk in hospital room?: Total Help needed climbing 3-5 steps with a railing? : Total 6 Click Score: 8    End of Session Equipment Utilized During Treatment: Oxygen Activity Tolerance: Patient tolerated treatment well;Other (comment) (tx limited due to need for pt clean-up, RN notified) Patient left: in bed;with call bell/phone within reach;with nursing/sitter in room Nurse Communication: Mobility status;Other (comment) (pt needing posterior peri-care) PT Visit Diagnosis: Muscle weakness (generalized) (M62.81);Pain     Time: 3013-1438 PT Time Calculation (min) (ACUTE ONLY): 35 min  Charges:  $Therapeutic Exercise: 23-37 mins                     Angel Brewer P., PTA Acute Rehabilitation Services Secure Chat Preferred 9a-5:30pm Office: [place] 12/13/2021, 6:01 PM

## 2021-12-13 NOTE — Progress Notes (Signed)
Progress Note   Patient: Angel Brewer UDT:[id number] DOB: 11/02/1965 DOA: 11/21/2021     22 DOS: the patient was seen and examined on 12/13/2021   Brief hospital course: Mrs. Marken was admitted to the hospital with the working diagnosis of acute on chronic diastolic heart failure, complicated with acute hypoxemic respiratory failure.   Addie Couey is a 56 y.o. female with medical history significant for HFpEF (last EF 60-65% October 2022), history of recurrent PE on Eliquis, pulmonary hypertension, HTN, HLD, OSA/OHS on CPAP, obesity class 3. Reported 2 days of worsening dyspnea, orthopnea and cough. On her initial physical examination her blood pressure was 144/83, HR 78, RR 34, 02 saturation 94% on 4 L/min per [place]. Distant breath sounds, with no wheezing, heart with S1 and S2 present and rhythmic, abdomen not distended, positive lower extremity edema.   Chest radiograph with cardiomegaly, bilateral interstitial infiltrates, congested hilar vasculature and cephalization of the vasculature.   5/11 initial admission to hospitalist service.  Started on BiPAP and diuresed successfully for 5 L. 5/13 increasing somnolence and hypercarbia new since admission. 5/15 am intubated. Discussed trach with son who wanted to give her a chance of extubation before proceeding straight to trach 5/16 failed SBT for tachypnea 5/17 failed SBT for tachypnea 5/18 failed SBT for rapid and shallow breathing despite high PS 5/19 tracheostomy placed 5/24 Placed on trach collar mid morning of 5/23 and has remained on ATC since  5/25 stable on TC  Transferred from PCCM to [facility] service 5/27  Patient is medically stable to be transfer to rehab.   Assessment and Plan: * Acute on chronic heart failure with preserved ejection fraction (HFpEF) (HCC) Acute hypoxemic and hypercapnic respiratory failure due to acute cardiogenic pulmonary edema.   Echocardiogram with preserved LV systolic function 60 to 97%, RV systolic  function is preserved, no significant valvular disease.   Urine output is 1,075 ml She is on 5 L per per trach collar with 02 saturation 93 to 94%. Systolic blood pressure 282 to 116 mmHg   Medical therapy with spironolactone and dapagliflozin.      Obesity hypoventilation syndrome ([place]) Patient sp trach.  #6 [place]iley trach, cuffed in place.  Continue trach care.  One way valve in place, patient tolerating well.   History of pulmonary embolus (PE) History of recurrent PE.  Continue Eliquis.  Acute metabolic encephalopathy Encephalopathy now has resolved.  Continue with clonazepam for anxiety  Essential hypertension Currently antihypertensive medications on hold.   Hyperlipidemia Continue atorvastatin.  Hyponatremia Follow up renal function in am.  Continue to hold on loop diuretic therapy.  Avoid hypotension and nephrotoxic medications.   Class 3 obesity (HCC) Calculated BMI is 64         Subjective: Patient with no chest pain, dyspnea has been improving,.   Physical Exam: Vitals:   12/13/21 0726 12/13/21 1122 12/13/21 1321 12/13/21 1509  BP:   116/82   Pulse: 87 91  66  Resp:   19   Temp:      TempSrc:      SpO2:      Weight:      Height:       Neurology awake and alert ENT with no pallor, trach in place Cardiovascular with S1 and S2 present and rhythmic Respiratory with no wheezing or rales Abdomen not distended No lower extremity edema  Data Reviewed:    Family Communication: no family at the bedside   Disposition: Status is: Inpatient Remains inpatient appropriate because: pending  placement   Planned Discharge Destination: Skilled nursing facility      Author: Tawni Millers, MD 12/13/2021 3:41 PM  For on call review [email or web address].

## 2021-12-13 NOTE — Progress Notes (Signed)
Physical Therapy Treatment Patient Details Name: Angel Brewer MRN: [id number] DOB: 1966-04-11 Today's Date: 12/13/2021   History of Present Illness Pt is a 56 y.o. female admitted 11/21/21 with SOB; workup for acute on chronic HF. Transfer to ICU secondary to hypercarbic respiratory failure with increased obtundation. ETT 5/15; s/p trach 5/19; trach collar 5/23. Transfer to floor unit 5/27. PMH includes HTN, DVT, recurrent PE on Eliquis, morbid obesity, wrist ligament reconstruction.    PT Comments    Pt received in supine, PTA called to room to assist pt with bed mobility while RN assist with hygiene/bed linen change. Pt following 2-step instructions well for rolling and posterior supine scooting, needing less assist for rolling (minA with bed rail assist) and able to remain on each side at least 2 mins during clean-up and linen change. Pt scooting well toward HOB while in supine with UE/LE utilization and tolerating HOB flat ~2-3 minutes prior to c/o increased back pain. Pt continues to benefit from PT services to progress toward functional mobility goals.   Recommendations for follow up therapy are one component of a multi-disciplinary discharge planning process, led by the attending physician.  Recommendations may be updated based on patient status, additional functional criteria and insurance authorization.  Follow Up Recommendations  Skilled nursing-short term rehab (<3 hours/day) (family unable to provide 24/7 assist, would benefit from higher intensity rehab)     Assistance Recommended at Discharge Frequent or constant Supervision/Assistance  Patient can return home with the following Two people to help with walking and/or transfers;Two people to help with bathing/dressing/bathroom;Help with stairs or ramp for entrance;Assist for transportation;Direct supervision/assist for financial management;Assistance with Education officer, environmental (measurements  PT);Wheelchair cushion (measurements PT);Hospital bed;Rolling walker (2 wheels) (currently would need mechanical lift and all bariatric equipment)    Recommendations for Other Services       Precautions / Restrictions Precautions Precautions: Fall Precaution Comments: trach, PSMV Restrictions Weight Bearing Restrictions: No     Mobility  Bed Mobility Overal bed mobility: Needs Assistance Bed Mobility: Rolling Rolling: Min assist   Supine to sit: Max assist     General bed mobility comments: L/R rolling after multi-modal cues for improved body mechanics (and remaining on each side for posterior peri-care and replacement of bed pads ~2 mins ea side) and posterior supine scooting toward HOB with flat bed; +2 for safety but pt able to perform both with only +1 minA and mod cues       Balance Overall balance assessment: Needs assistance Sitting balance - Comments: bed-level session second session for hygiene assist             Cognition Arousal/Alertness: Awake/alert Behavior During Therapy: WFL for tasks assessed/performed Overall Cognitive Status: Within Functional Limits for tasks assessed             General Comments: pt remains motivated with therapy, reports less anxiety today than in some other sessions, especially once KREG bed widened for her. PMSV donned throughout.           General Comments General comments (skin integrity, edema, etc.): VSS on 28% FiO2 trach mask      Pertinent Vitals/Pain Pain Assessment Pain Assessment: Faces Faces Pain Scale: Hurts [place] more Pain Location: back pain (increased in flat bed posture) Pain Descriptors / Indicators: Discomfort, Grimacing, Sore Pain Intervention(s): Monitored during session, Repositioned     PT Goals (current goals can now be found in the care plan section) Acute Rehab PT Goals Patient Stated Goal:  get back to work PT Goal Formulation: With patient Time For Goal Achievement: 12/14/21 Progress  towards PT goals: Progressing toward goals    Frequency    Min 3X/week (increased as pt plans to d/c home)      PT Plan Current plan remains appropriate       AM-PAC PT "6 Clicks" Mobility   Outcome Measure  Help needed turning from your back to your side while in a flat bed without using bedrails?: A Lot Help needed moving from lying on your back to sitting on the side of a flat bed without using bedrails?: A Lot Help needed moving to and from a bed to a chair (including a wheelchair)?: Total Help needed standing up from a chair using your arms (e.g., wheelchair or bedside chair)?: Total (+2 maxA) Help needed to walk in hospital room?: Total Help needed climbing 3-5 steps with a railing? : Total 6 Click Score: 8    End of Session Equipment Utilized During Treatment: Oxygen Activity Tolerance: Patient tolerated treatment well;Patient limited by fatigue (requesting to eat after peri-care) Patient left: in bed;with call bell/phone within reach;with nursing/sitter in room Nurse Communication: Mobility status PT Visit Diagnosis: Muscle weakness (generalized) (M62.81);Pain     Time: 1722-1730 PT Time Calculation (min) (ACUTE ONLY): 8 min  Charges:   $Therapeutic Activity: 8-22 mins                     Gianny Killman P., PTA Acute Rehabilitation Services Secure Chat Preferred 9a-5:30pm Office: [place] 12/13/2021, 6:31 PM

## 2021-12-13 NOTE — TOC Progression Note (Addendum)
Transition of Care [facility]) - Progression Note    Patient Details  Name: Angel Brewer MRN: [id number] Date of Birth: [birth year]  [facility] CM/SW Contact  Jacalyn Lefevre Edson Snowball, RN Phone Number: 12/13/2021, 2:59 PM  Clinical Narrative:     Followed up with patient .  Niece cannot move in with patient. Niece lives 30 minutes from patient and works two jobs. Son lives about 30 minutes from patient and works. She will have no assistance at home. Updated TOC supervisor, [facility] program ( she was not accepted) and CIR   Expected Discharge Plan:  (Home) Barriers to Discharge: Continued Medical Work up  Expected Discharge Plan and Services Expected Discharge Plan:  (Home) In-house Referral: Clinical Social Work Discharge Planning Services: CM Consult   Living arrangements for the past 2 months: Single Family Home                                       Social Determinants of Health (SDOH) Interventions    Readmission Risk Interventions    12/02/2019    1:47 PM  Readmission Risk Prevention Plan  Post Dischage Appt Complete  Medication Screening Complete  Transportation Screening Complete

## 2021-12-13 NOTE — Progress Notes (Signed)
Heart Failure Stewardship Pharmacist Progress Note   PCP: [name], NP PCP-Cardiologist: None    HPI:  56 yo female with PMH of OSA on CPAP, diastolic CHF, HLD, and DVT 2/2 pregnancy 2017, and pulmonary HTN.  Presented to ED with progressive dyspnea at rest and on exertion, orthopnea with LEE on exam.  CXR with moderate interstitial lung markings indicative of moderate/severe CHF and pulmonary edema.  Echo 05/12 with LVEF 60-65%, mild LVH, normal RV - unchanged since 04/2021 Echo.  Adherent to PTA furosemide.  Started on IV diuretics.  Became less responsive and developed respiratory acidosis despite being on bipap and was then intubated on 5/13.  Ongoing discussions for extubation vs trach - failed SBT 05/16, 05/17, 05/18. Trach placed 5/19.   Patient reported dysuria and vaginal itching 05/24 since foley removal and was started on Ceftriaxone x5d for empiric UTI and fluconazole x2 doses for possible yeast infection; afebrile, WBC wnl.    Repeat CXR on 05/25 showed moderate interstitial pulmonary edema (L > R alveolar opacity), largely unchanged from previous imaging.  Weaned off tube feeds 05/30 and is somewhat tolerating PO diet so far.   Current HF Medications: Aldosterone Antagonist: spironolactone 12.5 mg daily SGLT2i: Farxiga 10 mg daily  Prior to admission HF Medications: Diuretic: furosemide 40 mg daily  Pertinent Lab Values: As of 6/1: Serum creatinine 0.89 (baseline <1), BUN 44 (down), Potassium 4.0, Sodium 134, BNP 139.2, Magnesium 2.8  Vital Signs: Weight: 396 lbs (admission weight: 433.4 lbs)  05/07/2021 discharge weight: 401 lb Blood pressure: 110/70s Heart rate: 70-80s I/O: -1.6 L yesterday; cumulative net - 18 L  Medication Assistance / Insurance Benefits Check: Does the patient have prescription insurance?  Yes Type of insurance plan: Managed Medicaid - Entresto/SGLT2i require PA  Outpatient Pharmacy:  Prior to admission outpatient [facility] Is the  patient willing to use [facility] at discharge? Pending Is the patient willing to transition their outpatient pharmacy to utilize a [facility] outpatient pharmacy?   Pending    Assessment: 1. Acute on chronic diastolic CHF (LVEF 77-37%). NYHA class IV symptoms. - Off IV lasix - Continue spironolactone 12.5 mg daily - Continue Farxiga 10 mg daily - no h/o UTIs in the past.  Reported dysuria symptoms likely due to foley removal as she had no other symptoms, fever and WBC was wnl.  - No BB - Off amlodipine - Further GDMT optimization previously limited with trach/cortrak status    Plan: 1) Medication changes recommended at this time: - Agree with current regimen  2) Patient assistance: - Farxiga needs PA - will start today  3)  Education  - Patient has been educated on current HF medications and potential additions to HF medication regimen - Patient verbalizes understanding that over the next few months, these medication doses may change and more medications may be added to optimize HF regimen - Patient has been educated on basic disease state pathophysiology and goals of therapy  Kerby Nora, PharmD, BCPS Heart Failure Stewardship Pharmacist Phone [phone]  Please check [email or web address] for unit-specific pharmacy phone numbers.

## 2021-12-14 DIAGNOSIS — E662 Morbid (severe) obesity with alveolar hypoventilation: Secondary | ICD-10-CM | POA: Diagnosis not present

## 2021-12-14 DIAGNOSIS — G9341 Metabolic encephalopathy: Secondary | ICD-10-CM | POA: Diagnosis not present

## 2021-12-14 DIAGNOSIS — E871 Hypo-osmolality and hyponatremia: Secondary | ICD-10-CM | POA: Diagnosis not present

## 2021-12-14 DIAGNOSIS — E785 Hyperlipidemia, unspecified: Secondary | ICD-10-CM | POA: Diagnosis not present

## 2021-12-14 DIAGNOSIS — I10 Essential (primary) hypertension: Secondary | ICD-10-CM | POA: Diagnosis not present

## 2021-12-14 DIAGNOSIS — Z86711 Personal history of pulmonary embolism: Secondary | ICD-10-CM | POA: Diagnosis not present

## 2021-12-14 DIAGNOSIS — I5033 Acute on chronic diastolic (congestive) heart failure: Secondary | ICD-10-CM | POA: Diagnosis not present

## 2021-12-14 LAB — BASIC METABOLIC PANEL
Anion gap: 5 (ref 5–15)
BUN: 32 mg/dL — ABNORMAL HIGH (ref 6–20)
CO2: 39 mmol/L — ABNORMAL HIGH (ref 22–32)
Calcium: 9.5 mg/dL (ref 8.9–10.3)
Chloride: 90 mmol/L — ABNORMAL LOW (ref 98–111)
Creatinine, Ser: 0.88 mg/dL (ref 0.44–1.00)
GFR, Estimated: >60 mL/min (ref >60)
Glucose, Bld: 104 mg/dL — ABNORMAL HIGH (ref 70–99)
Potassium: 4.2 mmol/L (ref 3.5–5.1)
Sodium: 134 mmol/L — ABNORMAL LOW (ref 135–145)

## 2021-12-14 MED ORDER — SIMETHICONE 80 MG PO CHEW
80.0000 mg | CHEWABLE_TABLET | Freq: Four times a day (QID) | ORAL | Status: DC | PRN
  Administered 2021-12-15: 80 mg via ORAL
  Filled 2021-12-14: qty 1

## 2021-12-14 NOTE — Progress Notes (Addendum)
Progress Note   Patient: Angel Brewer [id number]:[id number] DOB: 1965/08/22 DOA: 11/21/2021     23 DOS: the patient was seen and examined on 12/14/2021   Brief hospital course: [name] was admitted to the hospital with the working diagnosis of acute on chronic diastolic heart failure, complicated with acute hypoxemic respiratory failure.   Angel Brewer is a 56 y.o. female with medical history significant for HFpEF (last EF 60-65% October 2022), history of recurrent PE on Eliquis, pulmonary hypertension, HTN, HLD, OSA/OHS on CPAP, obesity class 3. Reported 2 days of worsening dyspnea, orthopnea and cough. On her initial physical examination her blood pressure was 144/83, HR 78, RR 34, 02 saturation 94% on 4 L/min per [place]. Distant breath sounds, with no wheezing, heart with S1 and S2 present and rhythmic, abdomen not distended, positive lower extremity edema.   Chest radiograph with cardiomegaly, bilateral interstitial infiltrates, congested hilar vasculature and cephalization of the vasculature.   5/11 initial admission to hospitalist service.  Started on BiPAP and diuresed successfully for 5 L. 5/13 increasing somnolence and hypercarbia new since admission. 5/15 am intubated. Discussed trach with son who wanted to give her a chance of extubation before proceeding straight to trach 5/16 failed SBT for tachypnea 5/17 failed SBT for tachypnea 5/18 failed SBT for rapid and shallow breathing despite high PS 5/19 tracheostomy placed 5/24 Placed on trach collar mid morning of 5/23 and has remained on ATC since  5/25 stable on TC  Transferred from PCCM to [facility] service 5/27  Patient is medically stable to be transfer to rehab. She still has cuffed trach that probably need to be changed to uncuffed before discharge. Follow up with pulmonary recommendations.   Assessment and Plan: * Acute on chronic heart failure with preserved ejection fraction (HFpEF) (HCC) Acute hypoxemic and hypercapnic respiratory  failure due to acute cardiogenic pulmonary edema.   Echocardiogram with preserved LV systolic function 60 to 05%, RV systolic function is preserved, no significant valvular disease.   Urine output is 1,025 ml She is on 8 L per per trach collar with 02 saturation 97%. . Systolic blood pressure 110 to 119 mmHg   Medical therapy with spironolactone and dapagliflozin.  Continue to hold on furosemide for now.      Obesity hypoventilation syndrome ([place]) Patient sp trach.  #6 Shiley trach, cuffed in place.  Continue trach care.  One way valve in place, patient tolerating well.   History of pulmonary embolus (PE) History of recurrent PE.  Continue Eliquis.  Acute metabolic encephalopathy Encephalopathy now has resolved.  Continue with clonazepam for anxiety  Essential hypertension Currently antihypertensive medications on hold.   Hyperlipidemia Continue atorvastatin.  Hyponatremia Renal function today with serum cr at 0,88, K is 4,2 and bicarbonate at 39. Na 134.  Plan to continue close follow up renal function and electrolytes in 48 hrs.   Class 3 obesity (HCC) Calculated BMI is 64         Subjective: Patient continue to be very weak and deconditioned, has been out of bed with help from physical therapy, yesterday did PT in bed.   Physical Exam: Vitals:   12/14/21 0354 12/14/21 0726 12/14/21 0815 12/14/21 1142  BP: 139/71 119/88    Pulse: 74 71 82 76  Resp: 19 19 (!) 22 20  Temp: 97.6 F (36.4 C) 98 F (36.7 C)    TempSrc: Oral Oral    SpO2: 98% 96% 97% 97%  Weight: (!) 184 kg     Height:  Neurology awake and alert ENT with trach in place Cardiovascular with S1 and S2 present and rhythmic Respiratory with no rales or wheezing Abdomen not distended No lower extremity edema.  Data Reviewed:    Family Communication: no family at the bedside   Disposition: Status is: Inpatient Remains inpatient appropriate because: pending placement   Planned  Discharge Destination: Skilled nursing facility    Author: Tawni Millers, MD 12/14/2021 12:42 PM  For on call review [email or web address].

## 2021-12-15 DIAGNOSIS — E785 Hyperlipidemia, unspecified: Secondary | ICD-10-CM | POA: Diagnosis not present

## 2021-12-15 DIAGNOSIS — I10 Essential (primary) hypertension: Secondary | ICD-10-CM | POA: Diagnosis not present

## 2021-12-15 DIAGNOSIS — I5033 Acute on chronic diastolic (congestive) heart failure: Secondary | ICD-10-CM | POA: Diagnosis not present

## 2021-12-15 DIAGNOSIS — G9341 Metabolic encephalopathy: Secondary | ICD-10-CM | POA: Diagnosis not present

## 2021-12-15 DIAGNOSIS — E871 Hypo-osmolality and hyponatremia: Secondary | ICD-10-CM | POA: Diagnosis not present

## 2021-12-15 DIAGNOSIS — E662 Morbid (severe) obesity with alveolar hypoventilation: Secondary | ICD-10-CM | POA: Diagnosis not present

## 2021-12-15 DIAGNOSIS — Z86711 Personal history of pulmonary embolism: Secondary | ICD-10-CM | POA: Diagnosis not present

## 2021-12-15 MED ORDER — FUROSEMIDE 40 MG PO TABS
40.0000 mg | ORAL_TABLET | Freq: Every day | ORAL | Status: DC
  Administered 2021-12-15 – 2022-01-08 (×25): 40 mg via ORAL
  Filled 2021-12-15 (×25): qty 1

## 2021-12-15 NOTE — Progress Notes (Signed)
Progress Note  Patient: Angel Brewer [id number]:[id number] DOB: 21-Dec-1965  DOA: 11/21/2021  DOS: 12/15/2021    Brief hospital course: Angel Brewer was admitted to the hospital with the working diagnosis of acute on chronic diastolic heart failure, complicated with acute hypoxemic respiratory failure.   Angel Brewer is a 56 y.o. female with medical history significant for HFpEF (last EF 60-65% October 2022), history of recurrent PE on Eliquis, pulmonary hypertension, HTN, HLD, OSA/OHS on CPAP, obesity class 3. Reported 2 days of worsening dyspnea, orthopnea and cough. On her initial physical examination her blood pressure was 144/83, HR 78, RR 34, 02 saturation 94% on 4 L/min per [place]. Distant breath sounds, with no wheezing, heart with S1 and S2 present and rhythmic, abdomen not distended, positive lower extremity edema.   Chest radiograph with cardiomegaly, bilateral interstitial infiltrates, congested hilar vasculature and cephalization of the vasculature.   5/11 initial admission to hospitalist service.  Started on BiPAP and diuresed successfully for 5 L. 5/13 increasing somnolence and hypercarbia new since admission. 5/15 am intubated. Discussed trach with son who wanted to give her a chance of extubation before proceeding straight to trach 5/16 failed SBT for tachypnea 5/17 failed SBT for tachypnea 5/18 failed SBT for rapid and shallow breathing despite high PS 5/19 tracheostomy placed 5/24 Placed on trach collar mid morning of 5/23 and has remained on ATC since  5/25 stable on TC  Transferred from PCCM to [facility] service 5/27  Patient is medically stable to be transfer to rehab. She still has cuffed trach that probably need to be changed to uncuffed before discharge. Follow up with pulmonary recommendations.   Assessment and Plan: Acute hypoxemic and hypercapnic respiratory failure due to acute cardiogenic pulmonary edema due to acute on chronic HFpEF and OHS: - s/p tracheostomy 5/19 > ATC 5/24  on 10L/40% FiO2, currently with #6 Shiley cuffed, using PMV. Management per PCCM  Acute on chronic HFpEF, HTN: Echocardiogram with preserved LV systolic function 60 to 15%, RV systolic function is preserved, no significant valvular disease.  - s/p IV diuresis, will restart home lasix 31m daily - Manage BP, continue spironolactone and dapagliflozin.  - Holding home norvasc for now given normotension. - Continue to hold on furosemide for now.   OHS, OSA:  - Weight loss recommended, encouraged to mobilize as much as possible to avoid hypoventilation/atelectasis.   Leukocytosis: Unclear significance at this time.  - Recheck in AM - IS - Monitor fever curve.  Morbid obesity: Body mass index is 65.09 kg/m.  - Given comorbidities that could be impacted by weight loss, would recommend bariatric surgery evaluation if patient amenable.   History of recurrent PE:  - Continue eliquis.  Acute metabolic encephalopathy: Resolved.   Anxiety:  - Continue clonazepam low dose scheduled  HLD:   - Continue atorvastatin   Hyponatremia: Mild, stable.    Subjective: No new complaints. Breathing is stable over the past couple days. Feels weak and requiring significant assistance for any mobility.   Objective: Vitals:   12/15/21 0728 12/15/21 0755 12/15/21 1220 12/15/21 1527  BP: 109/80     Pulse: 72 69 65 74  Resp: _0 Temp: 97.7 F (36.5 C)     TempSrc: Oral     SpO2: 100% 95% 98% 96%  Weight:      Height:       Gen: Obese 56y.o. female in no distress Pulm: Nonlabored breathing thru trach collar, no wheezes or crackles. Diminished bilaterally.  CV:  Regular rate and rhythm. No murmur, rub, or gallop. UTD JVD, trace pitting dependent edema w/significant adiposity. GI: Abdomen soft, non-tender, non-distended, with normoactive bowel sounds.  Ext: Warm, no deformities Skin: No acute rashes, lesions or ulcers on visualized skin. Neuro: Alert and oriented. No focal neurological  deficits. Psych: Judgement and insight appear fair. Mood euthymic & affect congruent. Behavior is appropriate.    Data Personally reviewed: CBC: Recent Labs  Lab 12/11/21 0121  WBC 11.2*  HGB 11.4*  HCT 37.5  MCV 94.0  PLT 349   Basic Metabolic Panel: Recent Labs  Lab 12/09/21 0431 12/10/21 0532 12/11/21 0121 12/12/21 0419 12/14/21 0408  NA 134* 131* 134* 134* 134*  K 4.3 4.5 4.3 4.0 4.2  CL 86* 84* 86* 89* 90*  CO2 38* 36* 40* 40* 39*  GLUCOSE 123* 111* 102* 101* 104*  BUN 64* 58* 56* 44* 32*  CREATININE 1.01* 0.98 0.89 0.89 0.88  CALCIUM 9.8 9.7 9.6 9.6 9.5  MG  --   --  2.8*  --   --    Family Communication: None at bedside  Disposition: Status is: Inpatient Remains inpatient appropriate because: Unsafe DC Planned Discharge Destination: Pending SNF placement  Patrecia Pour, MD 12/15/2021 3:38 PM Page by [email or web address]

## 2021-12-16 DIAGNOSIS — I5033 Acute on chronic diastolic (congestive) heart failure: Secondary | ICD-10-CM | POA: Diagnosis not present

## 2021-12-16 DIAGNOSIS — E871 Hypo-osmolality and hyponatremia: Secondary | ICD-10-CM | POA: Diagnosis not present

## 2021-12-16 DIAGNOSIS — J9601 Acute respiratory failure with hypoxia: Secondary | ICD-10-CM | POA: Diagnosis not present

## 2021-12-16 DIAGNOSIS — I10 Essential (primary) hypertension: Secondary | ICD-10-CM | POA: Diagnosis not present

## 2021-12-16 DIAGNOSIS — Z86711 Personal history of pulmonary embolism: Secondary | ICD-10-CM | POA: Diagnosis not present

## 2021-12-16 DIAGNOSIS — E785 Hyperlipidemia, unspecified: Secondary | ICD-10-CM | POA: Diagnosis not present

## 2021-12-16 DIAGNOSIS — E662 Morbid (severe) obesity with alveolar hypoventilation: Secondary | ICD-10-CM | POA: Diagnosis not present

## 2021-12-16 DIAGNOSIS — G9341 Metabolic encephalopathy: Secondary | ICD-10-CM | POA: Diagnosis not present

## 2021-12-16 LAB — CBC
HCT: 36 % (ref 36.0–46.0)
Hemoglobin: 11.1 g/dL — ABNORMAL LOW (ref 12.0–15.0)
MCH: 28.9 pg (ref 26.0–34.0)
MCHC: 30.8 g/dL (ref 30.0–36.0)
MCV: 93.8 fL (ref 80.0–100.0)
Platelets: 234 10*3/uL (ref 150–400)
RBC: 3.84 MIL/uL — ABNORMAL LOW (ref 3.87–5.11)
RDW: 14.7 % (ref 11.5–15.5)
WBC: 5.9 10*3/uL (ref 4.0–10.5)
nRBC: 0.3 % — ABNORMAL HIGH (ref 0.0–0.2)

## 2021-12-16 LAB — BASIC METABOLIC PANEL
Anion gap: 5 (ref 5–15)
BUN: 22 mg/dL — ABNORMAL HIGH (ref 6–20)
CO2: 37 mmol/L — ABNORMAL HIGH (ref 22–32)
Calcium: 9.2 mg/dL (ref 8.9–10.3)
Chloride: 91 mmol/L — ABNORMAL LOW (ref 98–111)
Creatinine, Ser: 0.79 mg/dL (ref 0.44–1.00)
GFR, Estimated: >60 mL/min (ref >60)
Glucose, Bld: 94 mg/dL (ref 70–99)
Potassium: 3.8 mmol/L (ref 3.5–5.1)
Sodium: 133 mmol/L — ABNORMAL LOW (ref 135–145)

## 2021-12-16 MED ORDER — ATORVASTATIN CALCIUM 10 MG PO TABS
20.0000 mg | ORAL_TABLET | Freq: Every day | ORAL | Status: DC
  Administered 2021-12-17 – 2022-01-08 (×23): 20 mg via ORAL
  Filled 2021-12-16 (×22): qty 2

## 2021-12-16 NOTE — Progress Notes (Signed)
   NAMETziporah Brewer, MRN:  [id number], DOB:  06-15-1966, LOS: 25 ADMISSION DATE:  11/21/2021, CONSULTATION DATE: 11/23/2021 REFERRING MD:  Benson Norway, CHIEF COMPLAINT: Obtundation  History of Present Illness:  57 year old woman who was admitted with acute on chronic hypoxic/hypercapnic respiratory failure, requiring intubation and now s/p trach    Pertinent  Medical History   Past Medical History:  Diagnosis Date   DVT (deep vein thrombosis) in pregnancy    RLE DVT 02/2016   Dyspnea    Morbid obesity ([place])    PE (pulmonary embolism) 02/29/2016   Sleep apnea    uses a cpap    Significant Hospital Events: Including procedures, antibiotic start and stop dates in addition to other pertinent events   5/11 initial admission to hospitalist service.  Started on BiPAP and diuresed successfully for 5 L. 5/13 increasing somnolence and hypercarbia new since admission. 5/15 am intubated. Discussed trach with son who wanted to give her a chance of extubation before proceeding straight to trach 5/16 failed SBT for tachypnea 5/17 failed SBT for tachypnea 5/18 failed SBT for rapid and shallow breathing despite high PS 5/19 tracheostomy placed 5/24 Placed on trach collar mid morning of 5/23 and has remained on ATC since  5/25 Remains on ATC this am, continues to have secretions but appears to be managing them well   Interim History / Subjective:  Using PMV, lying in the bed up in chair position trying to eat breakfast.  Objective   Blood pressure 134/84, pulse 70, temperature (!) 97.5 F (36.4 C), temperature source Oral, resp. rate 18, height _0  (1.702 m), weight (!) 188.5 kg, last menstrual period 11/29/2015, SpO2 96 %.    FiO2 (%):  [40 %] 40 %   Intake/Output Summary (Last 24 hours) at 12/16/2021 3338 Last data filed at 12/15/2021 1731 Gross per 24 hour  Intake 300 ml  Output 400 ml  Net -100 ml   Filed Weights   12/14/21 0354 12/15/21 0008 12/16/21 0600  Weight: (!) 184 kg (!)  188.5 kg (!) 188.5 kg    Examination: Physical exam: General: Chronically ill-appearing morbidly obese female, lying on the bed HEENT: [place]/AT, eyes anicteric.  moist mucus membranes, s/p trach on trach collar Neuro: Alert, awake following commands Chest: Coarse breath sounds, no wheezes or rhonchi Heart: Regular rate and rhythm, no murmurs or gallops Abdomen: Soft, nontender, nondistended, bowel sounds present Skin: No rash   Assessment & Plan:   Acute on chronic respiratory failure with hypoxia and hypercapnia Obstructive sleep apnea/obesity hypoventilation syndrome-tracheostomy tube in place  Trach site looks clean and dry, continue trach collar trials as long as patient is able to tolerate Continue PMV I do not think patient is a candidate for decannulation considering she uses trilogy at home  Rest of the management per primary team  PCCM will continue to follow    Jacky Kindle MD [place] Am[facility] for pager If no response to pager, please call [phone] until 7pm After 7pm, Please call E-link [phone]

## 2021-12-16 NOTE — Progress Notes (Signed)
Physical Therapy Treatment Patient Details Name: Angel Brewer MRN: [id number] DOB: 1965-07-20 Today's Date: 12/16/2021   History of Present Illness Pt is a 56 y.o. female admitted 11/21/21 with SOB; workup for acute on chronic HF. Transfer to ICU secondary to hypercarbic respiratory failure with increased obtundation. ETT 5/15; s/p trach 5/19; trach collar 5/23. Transfer to floor unit 5/27. PMH includes HTN, DVT, recurrent PE on Eliquis, morbid obesity, wrist ligament reconstruction.    PT Comments    The pt was able to make good progress with standing tolerance and mobility this session, but relied on tilt-bed features rather than bed mobility or transfers to complete standing practice. Once tilted to standing, the pt completed LE exercises to include mini squats and wt shift with stepping. The pt reports pain on medial aspect of R knee with stance, no pain with PROM other than with testing of RLE HS flexibility which is significantly limited. Will continue to work on functional strength and mobility to allow for bed progress and eventual safe d/c from hospital.     Recommendations for follow up therapy are one component of a multi-disciplinary discharge planning process, led by the attending physician.  Recommendations may be updated based on patient status, additional functional criteria and insurance authorization.  Follow Up Recommendations  Skilled nursing-short term rehab (<3 hours/day) (family unable to provide 24/7 assist, would benefit from higher intensity rehab)     Assistance Recommended at Discharge Frequent or constant Supervision/Assistance  Patient can return home with the following Two people to help with walking and/or transfers;Two people to help with bathing/dressing/bathroom;Help with stairs or ramp for entrance;Assist for transportation;Direct supervision/assist for financial management;Assistance with Education officer, environmental  (measurements PT);Wheelchair cushion (measurements PT);Hospital bed;Rolling walker (2 wheels) (currently would need mechanical lift and all bariatric equipment)    Recommendations for Other Services       Precautions / Restrictions Precautions Precautions: Fall Precaution Comments: trach 8L 40% FiO2, PSMV Restrictions Weight Bearing Restrictions: No           Cognition Arousal/Alertness: Awake/alert Behavior During Therapy: WFL for tasks assessed/performed Overall Cognitive Status: Within Functional Limits for tasks assessed                                 General Comments: Pt enjoyed music by "[employer] [place]" PSMV worn throughout session        Exercises General Exercises - Upper Extremity Shoulder Flexion: Strengthening, Right, Left, Supine, Theraband Theraband Level (Shoulder Flexion): Level 3 (Green) Shoulder Extension: Strengthening, Right, Left, Supine, Theraband Theraband Level (Shoulder Extension): Level 3 (Green) Shoulder Horizontal ABduction: Strengthening, Right, Left, Supine, Theraband Theraband Level (Shoulder Horizontal Abduction): Level 2 (Red) Shoulder Horizontal ADduction: Strengthening, Right, Left, Theraband Theraband Level (Shoulder Horizontal Adduction): Level 2 (Red) Elbow Flexion: Strengthening, Right, Left, Supine, Theraband Theraband Level (Elbow Flexion): Level 3 (Green) Elbow Extension: Strengthening, Right, Left, Supine, Theraband Theraband Level (Elbow Extension): Level 3 (Green) General Exercises - Lower Extremity Hip Flexion/Marching: AROM, Both, Standing (3 each leg) Other Exercises Other Exercises: wt shift each side x 20 Other Exercises: supine HS stretch with RLE significantly more limited than LLE.    General Comments General comments (skin integrity, edema, etc.): VSS on 8L 405 FiO2 via trach collar      Pertinent Vitals/Pain Pain Assessment Pain Assessment: Faces Faces Pain Scale: Hurts even more Pain Location:  abdomen with leg lifts in standing Pain Descriptors /  Indicators: Discomfort, Grimacing, Sore Pain Intervention(s): Monitored during session, Repositioned, Limited activity within patient's tolerance     PT Goals (current goals can now be found in the care plan section) Acute Rehab PT Goals Patient Stated Goal: get back to work PT Goal Formulation: With patient Time For Goal Achievement: 12/30/21 Potential to Achieve Goals: Fair Progress towards PT goals: Progressing toward goals    Frequency    Min 3X/week      PT Plan Current plan remains appropriate    Co-evaluation PT/OT/SLP Co-Evaluation/Treatment: Yes Reason for Co-Treatment: Complexity of the patient's impairments (multi-system involvement);For patient/therapist safety;To address functional/ADL transfers PT goals addressed during session: Mobility/safety with mobility;Balance;Proper use of DME;Strengthening/ROM OT goals addressed during session: ADL's and self-care      AM-PAC PT "6 Clicks" Mobility   Outcome Measure  Help needed turning from your back to your side while in a flat bed without using bedrails?: A Lot Help needed moving from lying on your back to sitting on the side of a flat bed without using bedrails?: A Lot Help needed moving to and from a bed to a chair (including a wheelchair)?: Total Help needed standing up from a chair using your arms (e.g., wheelchair or bedside chair)?: Total Help needed to walk in hospital room?: Total Help needed climbing 3-5 steps with a railing? : Total 6 Click Score: 8    End of Session Equipment Utilized During Treatment: Oxygen Activity Tolerance: Patient tolerated treatment well;Patient limited by fatigue Patient left: in bed;with call bell/phone within reach;with nursing/sitter in room Nurse Communication: Mobility status PT Visit Diagnosis: Muscle weakness (generalized) (M62.81);Pain Pain - Right/Left: Right Pain - part of body: Knee     Time: 1126-1207 PT  Time Calculation (min) (ACUTE ONLY): 41 min  Charges:  $Therapeutic Exercise: 8-22 mins                     West Carbo, PT, DPT   Acute Rehabilitation Department Pager #: [phone]   Sandra Cockayne 12/16/2021, 1:43 PM

## 2021-12-16 NOTE — Procedures (Signed)
Tracheostomy Change Note  Patient Details:   Name: Angel Brewer DOB: 1965/12/15 MRN: [id number]    Airway Documentation:     Evaluation  O2 sats: stable throughout Complications: No apparent complications Patient did tolerate procedure well. Bilateral Breath Sounds: Diminished CO2 detector detected good color change and bilateral breath sounds were auscultated. Pt now has Shiley Flex 55m Uncuffed. Pt is currently stable, RT will continue to monitor.    RFelecia Jan6/11/2021, 2:45 PM

## 2021-12-16 NOTE — Progress Notes (Signed)
Progress Note  Patient: Angel Brewer RVI:[id number] DOB: 1966-05-21  DOA: 11/21/2021  DOS: 12/16/2021    Brief hospital course: Angel Brewer was admitted to the hospital with the working diagnosis of acute on chronic diastolic heart failure, complicated with acute hypoxemic respiratory failure.   Angel Brewer is a 56 y.o. female with medical history significant for HFpEF (last EF 60-65% October 2022), history of recurrent PE on Eliquis, pulmonary hypertension, HTN, HLD, OSA/OHS on CPAP, obesity class 3. Reported 2 days of worsening dyspnea, orthopnea and cough. On her initial physical examination her blood pressure was 144/83, HR 78, RR 34, 02 saturation 94% on 4 L/min per [place]. Distant breath sounds, with no wheezing, heart with S1 and S2 present and rhythmic, abdomen not distended, positive lower extremity edema.   Chest radiograph with cardiomegaly, bilateral interstitial infiltrates, congested hilar vasculature and cephalization of the vasculature.   5/11 initial admission to hospitalist service.  Started on BiPAP and diuresed successfully for 5 L. 5/13 increasing somnolence and hypercarbia new since admission. 5/15 am intubated. Discussed trach with son who wanted to give her a chance of extubation before proceeding straight to trach 5/16 failed SBT for tachypnea 5/17 failed SBT for tachypnea 5/18 failed SBT for rapid and shallow breathing despite high PS 5/19 tracheostomy placed 5/24 Placed on trach collar mid morning of 5/23 and has remained on ATC since  5/25 stable on TC  Transferred from PCCM to [facility] service 5/27  Patient is medically stable to be transfer to rehab. She still has cuffed trach that probably need to be changed to uncuffed before discharge. Follow up with pulmonary recommendations.   Assessment and Plan: Acute hypoxemic and hypercapnic respiratory failure due to acute cardiogenic pulmonary edema due to acute on chronic HFpEF and OHS: - s/p tracheostomy 5/19 > ATC 5/24  on 10L/40% FiO2, currently with #6 Shiley cuffed, using PMV. Management per PCCM. Not likely to decannulate given her use of trilogy at home.  Acute on chronic HFpEF, HTN: Echocardiogram with preserved LV systolic function 60 to 61%, RV systolic function is preserved, no significant valvular disease.  - Restarted home lasix 93m daily. Appears euvolemic s/p significant diuresis earlier in hospitalization. - Manage BP, continue spironolactone and dapagliflozin.  - Holding home norvasc for now given normotension.  OHS, OSA:  - Weight loss recommended, encouraged to mobilize as much as possible to avoid hypoventilation/atelectasis.   Leukocytosis: Resolved without targeted treatment.  Morbid obesity: Body mass index is 65.09 kg/m.  - Given comorbidities that could be impacted by weight loss, would recommend bariatric surgery evaluation if patient amenable.  - Despite challenging venous access, the patient no longer requires IV medications or regular blood draws, so will remove PICC to reduce risk of BSI.  History of recurrent PE:  - Continue eliquis.  Acute metabolic encephalopathy: Resolved.   Anxiety:  - Continue clonazepam low dose scheduled  HLD:   - Continue atorvastatin   Hyponatremia: Mild, stable.    Subjective: No new complaints.   Objective: Vitals:   12/16/21 0600 12/16/21 0815 12/16/21 1125 12/16/21 1429  BP:  134/84    Pulse:  70 78 76  Resp:  _0 Temp:      TempSrc:      SpO2:  96% 98% 94%  Weight: (!) 188.5 kg     Height:       Gen: 56y.o. female in no distress Pulm: Nonlabored breathing thru trach, minimal secretions. Clear and diminished. CV: Regular rate and  rhythm. No murmur, rub, or gallop. No JVD, no dependent edema. GI: Abdomen soft, non-tender, non-distended, with normoactive bowel sounds.  Ext: Warm, no deformities. Significant adiposity without pitting edema. Skin: No rashes, lesions or ulcers on visualized skin. PICC site c/d/i Neuro:  Alert and oriented. No focal neurological deficits. Psych: Judgement and insight appear fair. Mood euthymic & affect congruent. Behavior is appropriate.      Data Personally reviewed: CBC: Recent Labs  Lab 12/11/21 0121 12/16/21 0430  WBC 11.2* 5.9  HGB 11.4* 11.1*  HCT 37.5 36.0  MCV 94.0 93.8  PLT 294 271   Basic Metabolic Panel: Recent Labs  Lab 12/10/21 0532 12/11/21 0121 12/12/21 0419 12/14/21 0408 12/16/21 0430  NA 131* 134* 134* 134* 133*  K 4.5 4.3 4.0 4.2 3.8  CL 84* 86* 89* 90* 91*  CO2 36* 40* 40* 39* 37*  GLUCOSE 111* 102* 101* 104* 94  BUN 58* 56* 44* 32* 22*  CREATININE 0.98 0.89 0.89 0.88 0.79  CALCIUM 9.7 9.6 9.6 9.5 9.2  MG  --  2.8*  --   --   --    Family Communication: None at bedside  Disposition: Status is: Inpatient Remains inpatient appropriate because: Unsafe DC; medically stable Planned Discharge Destination: Pending SNF placement  Patrecia Pour, MD 12/16/2021 4:22 PM Page by [email or web address]

## 2021-12-16 NOTE — Progress Notes (Signed)
Order placed for PICC DC. Per nurse. Patient will not be discharging today. Talked with patient about the doctors or for the PICC to be pulled. The patient is hesitant to have the PICC line pulled  d/t difficulty getting labs. Requested nurse to discuss with patient. IV team will return latter to pull PICC line if orders have not changed. Nurse VU. Fran Lowes, RN VAST

## 2021-12-16 NOTE — Progress Notes (Signed)
Speech Language Pathology Treatment: Dysphagia;Angel Brewer Speaking valve  Patient Details Name: Angel Brewer MRN: [id number] DOB: March 07, 1966 Today's Date: 12/16/2021 Time: 9643-8381 SLP Time Calculation (min) (ACUTE ONLY): 12 min  Assessment / Plan / Recommendation Clinical Impression  SWALLOWING Pt tolerating regular texture diet well per RN.  Pt states she is having no difficulty with PO intake and wearing valve during meals.  Pt tolerated thin liquid with no clinical s/s of aspiration. There was delayed mild, dry cough following single graham cracker trial of many.  Pt stated she felt like she ate too fast.  Pt has been having trouble with meats, but feels like it isn't a problem with chewing or swallowing, but more with the preparation of the food.  Pt would like to continue regular texture diet and choose appropriate foods. Pt does not have further ST needs for swallowing at this time.  Recommend continuing regular texture diet with thin liquids.  PMSV Pt was wearing valve comfortably on SLP arrival.  She demonstrated independent donning/doffing without the use of a mirror.  She has been taking off her valve independently prior to sleeping.  Pt able to verbalize reasons to remove valve. Reinforced education around cuffed trach tube.  Reminded pt to remove if she is feeling short of breath and discussed the roll a cuff can play in obstructing exhalation.  Pt has not experienced any difficulty with cuffed trach.  Still, recommend change to cuffless as medically appropriate.  SLP will continue to follow and provide education especially while pt has cuffed trach in place.  Recommend wearing valve throughout day as tolerated and during meals.  Remove at night.     HPI HPI: Angel Brewer is a 56 y/o female who presented on 5/11 with SOB, admitted with acute on chronic heart failure. Had to be tx to ICU secondary to hypercarbic respiratory failure with increased obtundation, intubated 5/15, s/p  trach 11/29/21, weaned from vent and tolerating TC trials 5/23. PMH includes HTN, recurrent PE on Eliquis, morbid obesity, DVT, radial ORIF and wrist ligament reconstruction.      SLP Plan  Continue with current plan of care      Recommendations for follow up therapy are one component of a multi-disciplinary discharge planning process, led by the attending physician.  Recommendations may be updated based on patient status, additional functional criteria and insurance authorization.    Recommendations  Diet recommendations: Regular;Thin liquid Liquids provided via: Straw Medication Administration: Whole meds with liquid Supervision: Patient able to self feed Compensations: Slow rate;Small sips/bites Postural Changes and/or Swallow Maneuvers: Seated upright 90 degrees;Upright 30-60 min after meal      Patient may use Passy-Muir Speech Valve: During all waking hours (remove during sleep) PMSV Supervision: Intermittent MD: Please consider changing trach tube to : Cuffless         Oral Care Recommendations: Oral care BID Follow Up Recommendations: Skilled nursing-short term rehab (<3 hours/day) Assistance recommended at discharge: Intermittent Supervision/Assistance SLP Visit Diagnosis: Aphonia (R49.1);Dysphagia, unspecified (R13.10) Plan: Continue with current plan of care           Angel Brewer, [place], [place] Office: [phone] 12/16/2021, 9:54 AM

## 2021-12-16 NOTE — Progress Notes (Signed)
Occupational Therapy Treatment Patient Details Name: Angel Brewer MRN: [id number] DOB: March 28, 1966 Today's Date: 12/16/2021   History of present illness Pt is a 56 y.o. female admitted 11/21/21 with SOB; workup for acute on chronic HF. Transfer to ICU secondary to hypercarbic respiratory failure with increased obtundation. ETT 5/15; s/p trach 5/19; trach collar 5/23. Transfer to floor unit 5/27. PMH includes HTN, DVT, recurrent PE on Eliquis, morbid obesity, wrist ligament reconstruction.   OT comments  New goals established for Pt this session. Today session focused on utilizing tilt bed to improve activity tolerance in standing. Pt maintained vitals on 8L 40% FiO2 via trach collar. One spike in HR up to 144 during one leg stance. Pt also very excited about theraband exercises provided by therapist (total of 4 bands attached to bed) encouraged single side and cross body exercises to engage core as well. Pt very motivated throughout session, enjoyed listening to "[place]". OT will continue to follow acutely. Current plan of care appropriate at this time.    Recommendations for follow up therapy are one component of a multi-disciplinary discharge planning process, led by the attending physician.  Recommendations may be updated based on patient status, additional functional criteria and insurance authorization.    Follow Up Recommendations  Skilled nursing-short term rehab (<3 hours/day)    Assistance Recommended at Discharge Frequent or constant Supervision/Assistance  Patient can return home with the following  Two people to help with walking and/or transfers;Two people to help with bathing/dressing/bathroom;Assistance with cooking/housework;Direct supervision/assist for medications management;Assist for transportation;Help with stairs or ramp for entrance   Equipment Recommendations  Other (comment) (TBD)    Recommendations for Other Services      Precautions / Restrictions  Precautions Precautions: Fall Precaution Comments: trach, PSMV Restrictions Weight Bearing Restrictions: No       Mobility Bed Mobility                    Transfers                         Balance                                           ADL either performed or assessed with clinical judgement   ADL Overall ADL's : Needs assistance/impaired     Grooming: Wash/dry hands;Wash/dry face;Set up;Standing Grooming Details (indicate cue type and reason): tilt bed             Lower Body Dressing: Total assistance;Bed level Lower Body Dressing Details (indicate cue type and reason): donned socks               General ADL Comments: demonstrating increased activity tolerance in standing via tilt bed for participation in ADL    Extremity/Trunk Assessment              Vision       Perception     Praxis      Cognition Arousal/Alertness: Awake/alert Behavior During Therapy: WFL for tasks assessed/performed Overall Cognitive Status: Within Functional Limits for tasks assessed                                 General Comments: Pt enjoyed music by "[place]" PSMV worn throughout session  Exercises Exercises: General Upper Extremity, General Lower Extremity General Exercises - Upper Extremity Shoulder Flexion: Strengthening, Right, Left, Supine, Theraband Theraband Level (Shoulder Flexion): Level 3 (Green) Shoulder Extension: Strengthening, Right, Left, Supine, Theraband Theraband Level (Shoulder Extension): Level 3 (Green) Shoulder Horizontal ABduction: Strengthening, Right, Left, Supine, Theraband Theraband Level (Shoulder Horizontal Abduction): Level 2 (Red) Shoulder Horizontal ADduction: Strengthening, Right, Left, Theraband Theraband Level (Shoulder Horizontal Adduction): Level 2 (Red) Elbow Flexion: Strengthening, Right, Left, Supine, Theraband Theraband Level (Elbow Flexion): Level 3  (Green) Elbow Extension: Strengthening, Right, Left, Supine, Theraband Theraband Level (Elbow Extension): Level 3 (Green)    Shoulder Instructions       General Comments VSS on 8L 40% FiO2 via trach collar    Pertinent Vitals/ Pain       Pain Assessment Pain Assessment: Faces Faces Pain Scale: Hurts even more Pain Location: abdomen with leg lifts in standing Pain Descriptors / Indicators: Discomfort, Grimacing, Sore Pain Intervention(s): Monitored during session, Repositioned  Home Living                                          Prior Functioning/Environment              Frequency  Min 2X/week        Progress Toward Goals  OT Goals(current goals can now be found in the care plan section)  Progress towards OT goals: Progressing toward goals  Acute Rehab OT Goals Patient Stated Goal: get strong enough to go home OT Goal Formulation: With patient Time For Goal Achievement: 12/30/21 Potential to Achieve Goals: Good ADL Goals Pt Will Perform Grooming: with set-up;standing Pt Will Perform Upper Body Bathing: with min guard assist;with adaptive equipment;sitting Pt Will Perform Lower Body Bathing: with mod assist;sitting/lateral leans;with adaptive equipment Pt Will Transfer to Toilet: with max assist;with +2 assist;bedside commode Pt Will Perform Toileting - Clothing Manipulation and hygiene: with max assist;with 2+ total assist;sit to/from stand Pt/caregiver will Perform Home Exercise Program: Increased strength;Both right and left upper extremity;With theraband;Independently;With written HEP provided  Plan Discharge plan remains appropriate;Frequency remains appropriate    Co-evaluation    PT/OT/SLP Co-Evaluation/Treatment: Yes Reason for Co-Treatment: For patient/therapist safety;To address functional/ADL transfers PT goals addressed during session: Mobility/safety with mobility;Balance;Strengthening/ROM OT goals addressed during session:  ADL's and self-care;Strengthening/ROM      AM-PAC OT "6 Clicks" Daily Activity     Outcome Measure   Help from another person eating meals?: A [place] Help from another person taking care of personal grooming?: A [place] Help from another person toileting, which includes using toliet, bedpan, or urinal?: Total Help from another person bathing (including washing, rinsing, drying)?: A Lot Help from another person to put on and taking off regular upper body clothing?: A Lot Help from another person to put on and taking off regular lower body clothing?: Total 6 Click Score: 12    End of Session Equipment Utilized During Treatment: Oxygen (8 L 40% FiO2 via trach Collar)  OT Visit Diagnosis: Other abnormalities of gait and mobility (R26.89);Unsteadiness on feet (R26.81);Muscle weakness (generalized) (M62.81);Other (comment)   Activity Tolerance Patient tolerated treatment well   Patient Left in bed;with call bell/phone within reach   Nurse Communication Mobility status        Time: 1126-1207 OT Time Calculation (min): 41 min  Charges: OT General Charges $OT Visit: 1 Visit OT Treatments $Therapeutic Activity: 8-22 mins  Jesse Sans  OTR/L Acute Rehabilitation Services Pager: [phone] Office: [place] 12/16/2021, 1:27 PM

## 2021-12-17 DIAGNOSIS — I5033 Acute on chronic diastolic (congestive) heart failure: Secondary | ICD-10-CM | POA: Diagnosis not present

## 2021-12-17 DIAGNOSIS — E662 Morbid (severe) obesity with alveolar hypoventilation: Secondary | ICD-10-CM | POA: Diagnosis not present

## 2021-12-17 DIAGNOSIS — J9601 Acute respiratory failure with hypoxia: Secondary | ICD-10-CM | POA: Diagnosis not present

## 2021-12-17 DIAGNOSIS — G9341 Metabolic encephalopathy: Secondary | ICD-10-CM | POA: Diagnosis not present

## 2021-12-17 DIAGNOSIS — E785 Hyperlipidemia, unspecified: Secondary | ICD-10-CM | POA: Diagnosis not present

## 2021-12-17 DIAGNOSIS — I10 Essential (primary) hypertension: Secondary | ICD-10-CM | POA: Diagnosis not present

## 2021-12-17 DIAGNOSIS — E871 Hypo-osmolality and hyponatremia: Secondary | ICD-10-CM | POA: Diagnosis not present

## 2021-12-17 DIAGNOSIS — Z86711 Personal history of pulmonary embolism: Secondary | ICD-10-CM | POA: Diagnosis not present

## 2021-12-17 NOTE — TOC Progression Note (Addendum)
Transition of Care [facility]) - Progression Note    Patient Details  Name: Angel Brewer MRN: [id number] Date of Birth: 1966/03/28  Transition of [facility]) CM/SW Contact  Zenon Mayo, RN Phone Number: 12/17/2021, 11:30 AM  Clinical Narrative:    NCM spoke with patient , informed her that [facility] does not take her insurance and [facility] here does not take [facility] insurance but the [facility] [place] does take [place].  NCM asked patient if she would want to go there, she states she would go there.  [place] with [facility] will send her paperwork to the one in [place] to see if they could offer a bed for her.  Patient was driving before this hospitalization.  She is eating a regular diet. This is a new trach.cuffless number 6.   Per [place] with [facility], states the [facility] in  [place] is concerned about her not having support at home, so they are not able to take her. [name] was put in on 5/19, so has had for 20 days now, has 8 more days before a SNF would consider a trach.    Expected Discharge Plan:  (Home) Barriers to Discharge: Continued Medical Work up  Expected Discharge Plan and Services Expected Discharge Plan:  (Home) In-house Referral: Clinical Social Work Discharge Planning Services: CM Consult   Living arrangements for the past 2 months: [place] Determinants of Health (SDOH) Interventions Food Insecurity Interventions: Intervention Not Indicated Transportation Interventions: [facility])  Readmission Risk Interventions    12/02/2019    1:47 PM  Readmission Risk Prevention Plan  Post Dischage Appt Complete  Medication Screening Complete  Transportation Screening Complete

## 2021-12-17 NOTE — Progress Notes (Signed)
Heart Failure Patient Advocate Encounter   Received notification from [facility] Medicaid that prior authorization for [place] is required.   PA submitted on CoverMyMeds Key [id number] Status is approved through 12/12/22.    Kerby Nora, PharmD, BCPS Heart Failure Stewardship Pharmacist Phone [phone]  Please check [email or web address] for unit-specific pharmacist phone numbers

## 2021-12-17 NOTE — Progress Notes (Signed)
Patient is 408 lbs.  She was driving before she came to hospital, but now has trach collar ,  her transportation needs has changed to where she will need to be transported by ptar/ambulance with oxygen for trach collar.

## 2021-12-17 NOTE — Progress Notes (Signed)
Heart Failure Stewardship Pharmacist Progress Note   PCP: Angel Pounds, NP PCP-Cardiologist: None    HPI:  56 yo female with PMH of OSA on CPAP, diastolic CHF, HLD, and DVT 2/2 pregnancy 2017, and pulmonary HTN.  Presented to ED with progressive dyspnea at rest and on exertion, orthopnea with LEE on exam.  CXR with moderate interstitial lung markings indicative of moderate/severe CHF and pulmonary edema.  Echo 05/12 with LVEF 60-65%, mild LVH, normal RV - unchanged since 04/2021 Echo.  Adherent to PTA furosemide.  Started on IV diuretics.  Became less responsive and developed respiratory acidosis despite being on bipap and was then intubated on 5/13.  Ongoing discussions for extubation vs trach - failed SBT 05/16, 05/17, 05/18. Trach placed 5/19.   Patient reported dysuria and vaginal itching 05/24 since foley removal and was started on Ceftriaxone x5d for empiric UTI and fluconazole x2 doses for possible yeast infection; afebrile, WBC wnl.    Repeat CXR on 05/25 showed moderate interstitial pulmonary edema (L > R alveolar opacity), largely unchanged from previous imaging.  Weaned off tube feeds 05/30 and is somewhat tolerating PO diet so far.  Pending LTACH bed in [place].   Current HF Medications: Diuretic: furosemide 40 mg daily Aldosterone Antagonist: spironolactone 12.5 mg daily SGLT2i: Farxiga 10 mg daily  Prior to admission HF Medications: Diuretic: furosemide 40 mg daily  Pertinent Lab Values: As of 6/5: Serum creatinine 0.79 (baseline <1), BUN 22, Potassium 3.8, Sodium 133, BNP 139.2, Magnesium 2.8  Vital Signs: Weight: 408 lbs (admission weight: 433.4 lbs)  05/07/2021 discharge weight: 401 lb Blood pressure: 130/80s Heart rate: 70-80s I/O: -900 mL yesterday; cumulative net - 18.6 L  Medication Assistance / Insurance Benefits Check: Does the patient have prescription insurance?  Yes Type of insurance plan: Managed Medicaid - Entresto/SGLT2i require PA  Outpatient  Pharmacy:  Prior to admission [facility] Is the patient willing to use [facility] at discharge? Pending Is the patient willing to transition their outpatient pharmacy to utilize a [facility] outpatient pharmacy?   Pending    Assessment: 1. Acute on chronic diastolic CHF (LVEF 63-33%). NYHA class II symptoms. - Continue furosemide 40 mg PO daily - On spironolactone 12.5 mg daily - consider increasing to 25 mg daily - Continue Farxiga 10 mg daily - no h/o UTIs in the past.  Reported dysuria symptoms likely due to foley removal as she had no other symptoms, fever and WBC was wnl.  - No BB - Off amlodipine - Further GDMT optimization previously limited with trach/cortrak status    Plan: 1) Medication changes recommended at this time: - Increase spironolactone to 25 mg daily  2) Patient assistance: - Farxiga PA approved  3)  Education  - Patient has been educated on current HF medications and potential additions to HF medication regimen - Patient verbalizes understanding that over the next few months, these medication doses may change and more medications may be added to optimize HF regimen - Patient has been educated on basic disease state pathophysiology and goals of therapy  Kerby Nora, PharmD, BCPS Heart Failure Stewardship Pharmacist Phone [phone]  Please check [email or web address] for unit-specific pharmacy phone numbers.

## 2021-12-17 NOTE — Progress Notes (Signed)
Progress Note  Patient: Angel Brewer YQI:[id number] DOB: 1965/09/14  DOA: 11/21/2021  DOS: 12/17/2021    Brief hospital course: Angel Brewer was admitted to the hospital with the working diagnosis of acute on chronic diastolic heart failure, complicated with acute hypoxemic respiratory failure.   Angel Brewer is a 56 y.o. female with medical history significant for HFpEF (last EF 60-65% October 2022), history of recurrent PE on Eliquis, pulmonary hypertension, HTN, HLD, OSA/OHS on CPAP, obesity class 3. Reported 2 days of worsening dyspnea, orthopnea and cough. On her initial physical examination her blood pressure was 144/83, HR 78, RR 34, 02 saturation 94% on 4 L/min per [place]. Distant breath sounds, with no wheezing, heart with S1 and S2 present and rhythmic, abdomen not distended, positive lower extremity edema.   Chest radiograph with cardiomegaly, bilateral interstitial infiltrates, congested hilar vasculature and cephalization of the vasculature.   5/11 initial admission to hospitalist service.  Started on BiPAP and diuresed successfully for 5 L. 5/13 increasing somnolence and hypercarbia new since admission. 5/15 am intubated. Discussed trach with son who wanted to give her a chance of extubation before proceeding straight to trach 5/16 failed SBT for tachypnea 5/17 failed SBT for tachypnea 5/18 failed SBT for rapid and shallow breathing despite high PS 5/19 tracheostomy placed 5/24 Placed on trach collar mid morning of 5/23 and has remained on ATC since  5/25 stable on TC  Transferred from PCCM to [facility] service 5/27  Patient is medically stable to be transfer to rehab. She still has cuffed trach that probably need to be changed to uncuffed before discharge. Follow up with pulmonary recommendations.   Assessment and Plan: Acute hypoxemic and hypercapnic respiratory failure due to acute cardiogenic pulmonary edema due to acute on chronic HFpEF and OHS: - s/p tracheostomy 5/19 > ATC 5/24  on 10L/40% FiO2, currently with #6 Shiley cuffed, using PMV. Management per PCCM weekly and prn. Not likely to decannulate given her use of trilogy at home.  Acute on chronic HFpEF, HTN: Echocardiogram with preserved LV systolic function 60 to 38%, RV systolic function is preserved, no significant valvular disease.  - Restarted home lasix 59m daily. Appears euvolemic s/p significant diuresis earlier in hospitalization. - Continue spironolactone and dapagliflozin.  - Holding home norvasc for now given normotension.  OHS, OSA:  - Weight loss recommended, encouraged to mobilize as much as possible to avoid hypoventilation/atelectasis.   Leukocytosis: Resolved without targeted treatment.  Morbid obesity: Body mass index is 64.02 kg/m.  - Given comorbidities that could be impacted by weight loss, would recommend bariatric surgery evaluation if patient amenable.  - Despite challenging venous access, the patient no longer requires IV medications or regular blood draws, so will remove PICC to reduce risk of BSI.  History of recurrent PE:  - Continue eliquis.  Acute metabolic encephalopathy: Resolved.   Anxiety:  - Continue clonazepam low dose scheduled  HLD:   - Continue atorvastatin   Hyponatremia: Mild, stable.    Subjective: Feels well, working with PT and feels she's progressing somewhat. No dyspnea.   Objective: Vitals:   12/17/21 0524 12/17/21 0818 12/17/21 0833 12/17/21 1143  BP: (!) 130/30  133/83   Pulse: 67 75  89  Resp: 20 (!) 22  18  Temp: 98.3 F (36.8 C)  97.9 F (36.6 C)   TempSrc: Oral  Oral   SpO2: 98% 96% 99% 93%  Weight: (!) 185.4 kg     Height:       Gen: 56y.o.  female in no distress Pulm: Nonlabored breathing w/PMV, clear/distant. CV: Regular rate and rhythm. No murmur, rub, or gallop. No JVD, no dependent edema. GI: Abdomen soft, non-tender, non-distended, with normoactive bowel sounds.  Ext: Warm, no deformities Skin: No rashes, lesions or ulcers on  visualized skin. Neuro: Alert and oriented. No focal neurological deficits. Psych: Judgement and insight appear fair. Mood euthymic & affect congruent. Behavior is appropriate.    Data Personally reviewed: CBC: Recent Labs  Lab 12/11/21 0121 12/16/21 0430  WBC 11.2* 5.9  HGB 11.4* 11.1*  HCT 37.5 36.0  MCV 94.0 93.8  PLT 294 754   Basic Metabolic Panel: Recent Labs  Lab 12/11/21 0121 12/12/21 0419 12/14/21 0408 12/16/21 0430  NA 134* 134* 134* 133*  K 4.3 4.0 4.2 3.8  CL 86* 89* 90* 91*  CO2 40* 40* 39* 37*  GLUCOSE 102* 101* 104* 94  BUN 56* 44* 32* 22*  CREATININE 0.89 0.89 0.88 0.79  CALCIUM 9.6 9.6 9.5 9.2  MG 2.8*  --   --   --    Family Communication: None at bedside  Disposition: Status is: Inpatient Remains inpatient appropriate because: Unsafe DC; medically stable Planned Discharge Destination: Pending SNF placement  Patrecia Pour, MD 12/17/2021 2:32 PM Page by [email or web address]

## 2021-12-18 DIAGNOSIS — E785 Hyperlipidemia, unspecified: Secondary | ICD-10-CM | POA: Diagnosis not present

## 2021-12-18 DIAGNOSIS — J9601 Acute respiratory failure with hypoxia: Secondary | ICD-10-CM | POA: Diagnosis not present

## 2021-12-18 DIAGNOSIS — G9341 Metabolic encephalopathy: Secondary | ICD-10-CM | POA: Diagnosis not present

## 2021-12-18 DIAGNOSIS — I10 Essential (primary) hypertension: Secondary | ICD-10-CM | POA: Diagnosis not present

## 2021-12-18 DIAGNOSIS — E871 Hypo-osmolality and hyponatremia: Secondary | ICD-10-CM | POA: Diagnosis not present

## 2021-12-18 DIAGNOSIS — I5033 Acute on chronic diastolic (congestive) heart failure: Secondary | ICD-10-CM | POA: Diagnosis not present

## 2021-12-18 DIAGNOSIS — E662 Morbid (severe) obesity with alveolar hypoventilation: Secondary | ICD-10-CM | POA: Diagnosis not present

## 2021-12-18 DIAGNOSIS — Z86711 Personal history of pulmonary embolism: Secondary | ICD-10-CM | POA: Diagnosis not present

## 2021-12-18 MED ORDER — SPIRONOLACTONE 25 MG PO TABS
25.0000 mg | ORAL_TABLET | Freq: Every day | ORAL | Status: DC
  Administered 2021-12-18 – 2022-01-08 (×22): 25 mg via ORAL
  Filled 2021-12-18 (×22): qty 1

## 2021-12-18 NOTE — Progress Notes (Signed)
Mobility Specialist Progress Note:   12/18/21 1115  Mobility  Activity Transferred from chair to bed  Level of Assistance +2 (takes two people)  Assistive Device MaxiMove  Activity Response Tolerated well  $Mobility charge 1 Mobility   Pt received in chair needing to get back to bed. No complaints of pain. Left in bed with call bell in reach and all needs met.   [facility] Cesar Alf Mobility Specialist

## 2021-12-18 NOTE — Progress Notes (Signed)
Physical Therapy Treatment Patient Details Name: Angel Brewer MRN: [id number] DOB: 1965-11-09 Today's Date: 12/18/2021   History of Present Illness Pt is a 56 y.o. female admitted 11/21/21 with SOB; workup for acute on chronic HF. Transfer to ICU secondary to hypercarbic respiratory failure with increased obtundation. ETT 5/15; s/p trach 5/19; trach collar 5/23. PMH includes HTN, DVT, recurrent PE on Eliquis, morbid obesity, wrist ligament reconstruction.    PT Comments    The pt was eager to participate and progress OOB mobility this morning. She continues to benefit from use of lift to transfer from bed to recliner, but once in recliner the pt was able to complete sit-stand x3 with minA of 2 through HHA, and progressed to completing 2 x 66f bouts of ambulation with minA of 2. The pt ambulates with wide BOS and small steps with minimal wt shift, but is making great progress with activity tolerance and remains highly motivated. Will benefit from maximal therapy to continue with progress and independence with mobility.   SpO2 95% on 8L 40% FiO2 via trach collar   Recommendations for follow up therapy are one component of a multi-disciplinary discharge planning process, led by the attending physician.  Recommendations may be updated based on patient status, additional functional criteria and insurance authorization.  Follow Up Recommendations  Skilled nursing-short term rehab (<3 hours/day) (would benefit from AIR despite lack of support as pt is highly motivated and making great progress lately)     Assistance Recommended at Discharge Frequent or constant Supervision/Assistance  Patient can return home with the following Two people to help with walking and/or transfers;Two people to help with bathing/dressing/bathroom;Help with stairs or ramp for entrance;Assist for transportation;Direct supervision/assist for financial management;Assistance with cFreight forwarder(measurements PT);Wheelchair cushion (measurements PT);Hospital bed;Rolling walker (2 wheels)    Recommendations for Other Services       Precautions / Restrictions Precautions Precautions: Fall Precaution Comments: trach 8L 40% FiO2, PSMV Restrictions Weight Bearing Restrictions: No     Mobility  Bed Mobility Overal bed mobility: Needs Assistance Bed Mobility: Rolling Rolling: Min assist         General bed mobility comments: pt completing with use of bed rails, minA to complete movement    Transfers Overall transfer level: Needs assistance Equipment used: Ambulation equipment used, 2 person hand held assist Transfers: Sit to/from Stand, Bed to chair/wheelchair/BSC Sit to Stand: Min assist, +2 physical assistance           General transfer comment: maximove used to place pt in recliner from kreg bed. then pt completed x3 sit-stand with minA of 2 through HHA from recliner. Transfer via Lift Equipment: Maximove  Ambulation/Gait Ambulation/Gait assistance: Min assist, +2 physical assistance Gait Distance (Feet): 10 Feet (+ 10 ft) Assistive device: 2 person hand held assist Gait Pattern/deviations: Step-to pattern, Wide base of support, Decreased stride length Gait velocity: decreased Gait velocity interpretation: <1.31 ft/sec, indicative of household ambulator   General Gait Details: pt with small steps and wide BOS, minimal step length and wt shift but improved balance and fluidity with continued practice    Balance Overall balance assessment: Needs assistance Sitting-balance support: Feet unsupported, Bilateral upper extremity supported   Sitting balance - Comments: sitting up in recliner. Able to scoot forward with min assists   Standing balance support: Bilateral upper extremity supported Standing balance-[name]: Fair Standing balance comment: can static stand without UE support, BUE support for gait  Cognition Arousal/Alertness: Awake/alert Behavior During Therapy: WFL for tasks assessed/performed Overall Cognitive Status: Within Functional Limits for tasks assessed                                 General Comments: pt remains motivated with therapy           General Comments General comments (skin integrity, edema, etc.): SpO2 95% on 8L FiO2 40% trach collar      Pertinent Vitals/Pain Pain Assessment Pain Assessment: Faces Faces Pain Scale: Hurts a [place] bit Pain Location: R knee with wt bearing, pt states it improved with mobility Pain Descriptors / Indicators: Discomfort, Grimacing Pain Intervention(s): Limited activity within patient's tolerance, Monitored during session, Repositioned     PT Goals (current goals can now be found in the care plan section) Acute Rehab PT Goals Patient Stated Goal: get back to work PT Goal Formulation: With patient Time For Goal Achievement: 12/30/21 Potential to Achieve Goals: Fair Progress towards PT goals: Progressing toward goals    Frequency    Min 3X/week (increased as pt plans to d/c home)      PT Plan Current plan remains appropriate    Co-evaluation PT/OT/SLP Co-Evaluation/Treatment: Yes Reason for Co-Treatment: For patient/therapist safety;To address functional/ADL transfers PT goals addressed during session: Mobility/safety with mobility;Balance        AM-PAC PT "6 Clicks" Mobility   Outcome Measure  Help needed turning from your back to your side while in a flat bed without using bedrails?: A Lot Help needed moving from lying on your back to sitting on the side of a flat bed without using bedrails?: A Lot Help needed moving to and from a bed to a chair (including a wheelchair)?: Total Help needed standing up from a chair using your arms (e.g., wheelchair or bedside chair)?: A Lot Help needed to walk in hospital room?: Total (unable to complete 20 ft) Help needed climbing 3-5 steps with a  railing? : Total 6 Click Score: 9    End of Session Equipment Utilized During Treatment: Oxygen;Gait belt Activity Tolerance: Patient tolerated treatment well Patient left: with call bell/phone within reach;in chair Nurse Communication: Mobility status PT Visit Diagnosis: Muscle weakness (generalized) (M62.81);Pain Pain - Right/Left: Right Pain - part of body: Knee     Time: 0824-0905 PT Time Calculation (min) (ACUTE ONLY): 41 min  Charges:  $Gait Training: 8-22 mins $Therapeutic Exercise: 8-22 mins                     West Carbo, PT, DPT   Acute Rehabilitation Department Pager #: [phone]   Sandra Cockayne 12/18/2021, 9:26 AM

## 2021-12-18 NOTE — Progress Notes (Addendum)
Nutrition Follow-up  DOCUMENTATION CODES:   Obesity unspecified  INTERVENTION:  Continue Ensure Enlive po BID, each supplement provides 350 kcal and 20 grams of protein.   Continue MVI with minerals daily  NUTRITION DIAGNOSIS:   Inadequate oral intake related to inability to eat as evidenced by NPO status; diet advanced; progressing  GOAL:   Patient will meet greater than or equal to 90% of their needs; progressing  MONITOR:   PO intake, Supplement acceptance, Labs, Weight trends, Skin, I & O's  REASON FOR ASSESSMENT:   Consult Enteral/tube feeding initiation and management  ASSESSMENT:   56 y.o. female presented to the ED with dyspnea. PMH includes COPD, HTN, and CHF. Pt admitted with acute on chronic heart failure and acute respiratory failure with hypoxia.  5/13 - intubated 5/14 - TF initiated 5/15 - Cortrak placed (tip post-pyloric) 5/19 - Trach placed 5/22 - Cortrak replaced 5/23 - placed on trach collar 5/25- FEES; diet adv to Dys 3/thin  5/29- diet adv to regular 5/30- TF d/c; Cortrak removed  Meal completion has been varied from 0-100% with 75% at lunch today. Pt currently has Ensure ordered and has been consuming them. RD to continue with current orders to aid in caloric and protein needs. Pt has been refusing Prosource plus. RD to discontinue order. Pt encouraged to eat her food at meals and to drink her supplements.   Labs and medications reviewed.   Diet Order:   Diet Order             Diet regular Room service appropriate? Yes; Fluid consistency: Thin  Diet effective now                   EDUCATION NEEDS:   Not appropriate for education at this time  Skin:  Skin Assessment: Reviewed RN Assessment  Last BM:  6/7  Height:   Ht Readings from Last 1 Encounters:  12/06/21 5' 7" (1.702 m)    Weight:   Wt Readings from Last 1 Encounters:  12/18/21 (!) 185 kg    Ideal Body Weight:  63.6 kg  BMI:  Body mass index is 63.88  kg/m.  Estimated Nutritional Needs:   Kcal:  1600-1800 kcals  Protein:  140-175 g  Fluid:  >/= 1.6 L  Corrin Parker, MS, RD, LDN RD pager number/after hours weekend pager number on Amion.

## 2021-12-18 NOTE — TOC Progression Note (Signed)
Transition of Care [facility]) - Progression Note    Patient Details  Name: Angel Brewer MRN: [id number] Date of Birth: 1966-03-04  Transition of [facility]) CM/SW Contact  Zenon Mayo, RN Phone Number: 12/18/2021, 11:41 AM  Clinical Narrative:    Physical therapy states she is on the radar for the [facility] program , may be able to start at end of week.  TOC will continue to follow for dc needs.  Her trach was put in on 5/19.     Expected Discharge Plan:  (Home) Barriers to Discharge: Continued Medical Work up  Expected Discharge Plan and Services Expected Discharge Plan:  (Home) In-house Referral: Clinical Social Work Discharge Planning Services: CM Consult   Living arrangements for the past 2 months: [place] Determinants of Health (SDOH) Interventions Food Insecurity Interventions: Intervention Not Indicated Transportation Interventions: [facility])  Readmission Risk Interventions    12/02/2019    1:47 PM  Readmission Risk Prevention Plan  Post Dischage Appt Complete  Medication Screening Complete  Transportation Screening Complete

## 2021-12-18 NOTE — Progress Notes (Signed)
Progress Note  Patient: Angel Brewer DOB: 1966-02-17  DOA: 11/21/2021  DOS: 12/18/2021    Brief hospital course: Angel Brewer was admitted to the hospital with the working diagnosis of acute on chronic diastolic heart failure, complicated with acute hypoxemic respiratory failure.   Angel Brewer is a 56 y.o. female with medical history significant for HFpEF (last EF 60-65% October 2022), history of recurrent PE on Eliquis, pulmonary hypertension, HTN, HLD, OSA/OHS on CPAP, obesity class 3. Reported 2 days of worsening dyspnea, orthopnea and cough. On her initial physical examination her blood pressure was 144/83, HR 78, RR 34, 02 saturation 94% on 4 L/min per Angel Brewer. Distant breath sounds, with no wheezing, heart with S1 and S2 present and rhythmic, abdomen not distended, positive lower extremity edema.   Chest radiograph with cardiomegaly, bilateral interstitial infiltrates, congested hilar vasculature and cephalization of the vasculature.   5/11 initial admission to hospitalist service.  Started on BiPAP and diuresed successfully for 5 L. 5/13 increasing somnolence and hypercarbia new since admission. 5/15 am intubated. Discussed trach with son who wanted to give her a chance of extubation before proceeding straight to trach 5/16 failed SBT for tachypnea 5/17 failed SBT for tachypnea 5/18 failed SBT for rapid and shallow breathing despite high PS 5/19 tracheostomy placed 5/24 Placed on trach collar mid morning of 5/23 and has remained on ATC since  5/25 stable on TC  Transferred from PCCM to [facility] service 5/27  Patient is medically stable to be transfer to rehab. She still has cuffed trach that probably need to be changed to uncuffed before discharge. Follow up with pulmonary recommendations.   Assessment and Plan: Acute hypoxemic and hypercapnic respiratory failure due to acute cardiogenic pulmonary edema due to acute on chronic HFpEF and OHS: - s/p tracheostomy 5/19 > ATC 5/24  on 10L/40% FiO2, currently with #6 Shiley uncuffed, using PMV. Management per PCCM weekly and prn. Not likely to decannulate given her use of trilogy at home.  Acute on chronic HFpEF, HTN: Echocardiogram with preserved LV systolic function 60 to 73%, RV systolic function is preserved, no significant valvular disease.  - Restarted home lasix 67m daily. Appears euvolemic s/p significant diuresis earlier in hospitalization. - Continue spironolactone and dapagliflozin.  - Holding home norvasc for now, will augment spironolactone today, recheck labs in 3 days.  OHS, OSA:  - Weight loss recommended, encouraged to mobilize as much as possible to avoid hypoventilation/atelectasis.   Leukocytosis: Resolved without targeted treatment.  Morbid obesity: Body mass index is 63.88 kg/m.  - Given comorbidities that could be impacted by weight loss, would recommend bariatric surgery evaluation if patient amenable.  - Despite challenging venous access, the patient no longer requires IV medications or regular blood draws, so will remove PICC to reduce risk of BSI.  History of recurrent PE:  - Continue eliquis.  Acute metabolic encephalopathy: Resolved.   Anxiety:  - Continue clonazepam low dose scheduled  HLD:   - Continue atorvastatin   Hyponatremia: Mild, stable.    Subjective: Eager to get up to chair with PT and OT this morning. Breathing stable, no new pain or other issues.   Objective: Vitals:   12/18/21 0511 12/18/21 0734 12/18/21 0949 12/18/21 1133  BP: 124/85  (!) 125/107 130/86  Pulse: 83 82 84   Resp: _0 Temp: 98.3 F (36.8 C)  97.8 F (36.6 C) 97.8 F (36.6 C)  TempSrc: Oral  Oral Oral  SpO2: 98% 98% 98%   Weight: (Marland Kitchen  185 kg     Height:       Gen: Morbidly obese female in no distress Pulm: Nonlabored breathing thru trach. Clear. CV: Regular rate and rhythm. No murmur, rub, or gallop. No JVD, no pitting dependent edema. GI: Abdomen soft, non-tender, non-distended,  with normoactive bowel sounds.  Ext: Warm, no deformities Skin: No new rashes, lesions or ulcers on visualized skin. Neuro: Alert and oriented. No focal neurological deficits. Psych: Judgement and insight appear fair. Mood euthymic & affect congruent. Behavior is appropriate.    Data Personally reviewed: CBC: Recent Labs  Lab 12/16/21 0430  WBC 5.9  HGB 11.1*  HCT 36.0  MCV 93.8  PLT 594   Basic Metabolic Panel: Recent Labs  Lab 12/12/21 0419 12/14/21 0408 12/16/21 0430  NA 134* 134* 133*  K 4.0 4.2 3.8  CL 89* 90* 91*  CO2 40* 39* 37*  GLUCOSE 101* 104* 94  BUN 44* 32* 22*  CREATININE 0.89 0.88 0.79  CALCIUM 9.6 9.5 9.2   Family Communication: None at bedside  Disposition: Status is: Inpatient Remains inpatient appropriate because: Unsafe DC; medically stable Planned Discharge Destination: Pending SNF placement  Angel Pour, MD 12/18/2021 12:02 PM Page by [email or web address]

## 2021-12-18 NOTE — Progress Notes (Signed)
Heart Failure Stewardship Pharmacist Progress Note   PCP: [name], NP PCP-Cardiologist: None    HPI:  56 yo female with PMH of OSA on CPAP, diastolic CHF, HLD, and DVT 2/2 pregnancy 2017, and pulmonary HTN.  Presented to ED with progressive dyspnea at rest and on exertion, orthopnea with LEE on exam.  CXR with moderate interstitial lung markings indicative of moderate/severe CHF and pulmonary edema.  Echo 05/12 with LVEF 60-65%, mild LVH, normal RV - unchanged since 04/2021 Echo.  Adherent to PTA furosemide.  Started on IV diuretics.  Became less responsive and developed respiratory acidosis despite being on bipap and was then intubated on 5/13.  Ongoing discussions for extubation vs trach - failed SBT 05/16, 05/17, 05/18. Trach placed 5/19.   Patient reported dysuria and vaginal itching 05/24 since foley removal and was started on Ceftriaxone x5d for empiric UTI and fluconazole x2 doses for possible yeast infection; afebrile, WBC wnl.    Repeat CXR on 05/25 showed moderate interstitial pulmonary edema (L > R alveolar opacity), largely unchanged from previous imaging.  Weaned off tube feeds 05/30 and is somewhat tolerating PO diet so far.  Pending LTACH bed in [place].   Current HF Medications: Diuretic: furosemide 40 mg daily Aldosterone Antagonist: spironolactone 25 mg daily SGLT2i: Farxiga 10 mg daily  Prior to admission HF Medications: Diuretic: furosemide 40 mg daily  Pertinent Lab Values: As of 6/5: Serum creatinine 0.79 (baseline <1), BUN 22, Potassium 3.8, Sodium 133, BNP 139.2, Magnesium 2.8  Vital Signs: Weight: 407 lbs (admission weight: 433.4 lbs)  05/07/2021 discharge weight: 401 lb Blood pressure: 120/80s Heart rate: 70-80s I/O: -1L yesterday; cumulative net - 19.4 L  Medication Assistance / Insurance Benefits Check: Does the patient have prescription insurance?  Yes Type of insurance plan: Managed Medicaid - Entresto/SGLT2i require PA  Outpatient  Pharmacy:  Prior to admission outpatient pharmacy: [facility] Is the patient willing to use [facility] at discharge? Pending Is the patient willing to transition their outpatient pharmacy to utilize a [facility] outpatient pharmacy?   Pending    Assessment: 1. Acute on chronic diastolic CHF (LVEF 84-03%). NYHA class II symptoms. - Continue furosemide 40 mg PO daily - Agree with increasing to spironolactone 25 mg daily - Continue Farxiga 10 mg daily - no h/o UTIs in the past.  Reported dysuria symptoms likely due to foley removal as she had no other symptoms, fever and WBC was wnl.  - No BB - Off amlodipine - Further GDMT optimization previously limited with trach/cortrak status    Plan: 1) Medication changes recommended at this time: - Continue current regimen  2) Patient assistance: - Farxiga PA approved  3)  Education  - Patient has been educated on current HF medications and potential additions to HF medication regimen - Patient verbalizes understanding that over the next few months, these medication doses may change and more medications may be added to optimize HF regimen - Patient has been educated on basic disease state pathophysiology and goals of therapy  Kerby Nora, PharmD, BCPS Heart Failure Stewardship Pharmacist Phone [phone]  Please check [email or web address] for unit-specific pharmacy phone numbers.

## 2021-12-18 NOTE — Progress Notes (Signed)
Occupational Therapy Treatment Patient Details Name: Angel Brewer MRN: [id number] DOB: 1965/11/27 Today's Date: 12/18/2021   History of present illness Pt is a 56 y.o. female admitted 11/21/21 with SOB; workup for acute on chronic HF. Transfer to ICU secondary to hypercarbic respiratory failure with increased obtundation. ETT 5/15; s/p trach 5/19; trach collar 5/23. PMH includes HTN, DVT, recurrent PE on Eliquis, morbid obesity, wrist ligament reconstruction.   OT comments  Patient willing to use lift to get into recliner in order to address standing and mobility. Patient was able to stand x3 times and take steps with min assist of 2 with HHA. Patient was able to static stand to address peri area cleaning. Patient is motivated towards therapy and increasing mobility. Acute OT to continue to follow.    Recommendations for follow up therapy are one component of a multi-disciplinary discharge planning process, led by the attending physician.  Recommendations may be updated based on patient status, additional functional criteria and insurance authorization.    Follow Up Recommendations  Skilled nursing-short term rehab (<3 hours/day)    Assistance Recommended at Discharge Frequent or constant Supervision/Assistance  Patient can return home with the following  Two people to help with walking and/or transfers;Two people to help with bathing/dressing/bathroom;Assistance with cooking/housework;Direct supervision/assist for medications management;Assist for transportation;Help with stairs or ramp for entrance   Equipment Recommendations  Other (comment) (TBD)    Recommendations for Other Services      Precautions / Restrictions Precautions Precautions: Fall Precaution Comments: trach 8L 40% FiO2, PSMV Restrictions Weight Bearing Restrictions: No       Mobility Bed Mobility                    Transfers                         Balance                                            ADL either performed or assessed with clinical judgement   ADL Overall ADL's : Needs assistance/impaired             Lower Body Bathing: Maximal assistance;Sit to/from stand Lower Body Bathing Details (indicate cue type and reason): stood to clean peri area     Lower Body Dressing: Total assistance;Bed level Lower Body Dressing Details (indicate cue type and reason): donned socks               General ADL Comments: Patient able to stand with min assists +2 for peri area cleaning    Extremity/Trunk Assessment              Vision       Perception     Praxis      Cognition                                                Exercises      Shoulder Instructions       General Comments SpO2 95% on 8L FiO2 40% trach collar    Pertinent Vitals/ Pain          Home Living  Prior Functioning/Environment              Frequency  Min 2X/week        Progress Toward Goals  OT Goals(current goals can now be found in the care plan section)  Progress towards OT goals: Progressing toward goals  Acute Rehab OT Goals Patient Stated Goal: walk more OT Goal Formulation: With patient Time For Goal Achievement: 12/30/21 Potential to Achieve Goals: Good ADL Goals Pt Will Perform Grooming: with set-up;standing Pt Will Perform Upper Body Bathing: with min guard assist;with adaptive equipment;sitting Pt Will Perform Lower Body Bathing: with mod assist;sitting/lateral leans;with adaptive equipment Pt Will Transfer to Toilet: with max assist;with +2 assist;bedside commode Pt Will Perform Toileting - Clothing Manipulation and hygiene: with max assist;with 2+ total assist;sit to/from stand Pt/caregiver will Perform Home Exercise Program: Increased strength;Both right and left upper extremity;With theraband;Independently;With written HEP provided Additional ADL  Goal #1: Pt will perform bed mobility to come EOB as precursor to participation in ADL at [place] Discharge plan remains appropriate;Frequency remains appropriate    Co-evaluation    PT/OT/SLP Co-Evaluation/Treatment: Yes Reason for Co-Treatment: For patient/therapist safety;To address functional/ADL transfers PT goals addressed during session: Mobility/safety with mobility;Balance OT goals addressed during session: ADL's and self-care      AM-PAC OT "6 Clicks" Daily Activity     Outcome Measure   Help from another person eating meals?: A [place] Help from another person taking care of personal grooming?: A [place] Help from another person toileting, which includes using toliet, bedpan, or urinal?: Total Help from another person bathing (including washing, rinsing, drying)?: A Lot Help from another person to put on and taking off regular upper body clothing?: A Lot Help from another person to put on and taking off regular lower body clothing?: Total 6 Click Score: 12    End of Session Equipment Utilized During Treatment: Oxygen (8 L 40% FiO2 via trach Collar)  OT Visit Diagnosis: Other abnormalities of gait and mobility (R26.89);Unsteadiness on feet (R26.81);Muscle weakness (generalized) (M62.81);Other (comment)   Activity Tolerance Patient tolerated treatment well   Patient Left in chair;with call bell/phone within reach   Nurse Communication Mobility status        Time: 0825-0905 OT Time Calculation (min): 40 min  Charges: OT General Charges $OT Visit: 1 Visit OT Treatments $Self Care/Home Management : 8-22 mins  Lodema Hong, [place]  Pager [phone] Office [place] 12/18/2021, 9:50 AM

## 2021-12-18 NOTE — TOC Progression Note (Signed)
Transition of Care [facility]) - Progression Note    Patient Details  Name: Angel Brewer MRN: [id number] Date of Birth: 05-28-1966  Transition of Care [facility]) CM/SW [place], [place] Phone Number: 12/18/2021, 12:31 PM  Clinical Narrative:     CSW met with pt to discuss disposition. Explained that LTACH's have been unable to offer bed for pt and that alternative would be SNF. CSW explained that SNF's will be unable to offer a bed to pt until pt is 28-30 days out from initial trach placement. She states she is making efforts to participate with PT while here and make improvements with mobility; otherwise she is agreeable to SNF workup. Pt's trach was placed on 5/19 and day 28 will be 6/15. Barriers include trach, medicaid, and weight. Completed fl2 and faxed bed requests in hub. Pt reports she is not covid vaccinated.  Expected Discharge Plan: [place] Barriers to Discharge: No SNF bed, Insurance Authorization, SNF Pending bed offer  Expected Discharge Plan and Services Expected Discharge Plan: [place] In-house Referral: Clinical Social Work Discharge Planning Services: CM Consult   Living arrangements for the past 2 months: [place] Determinants of Health (SDOH) Interventions Food Insecurity Interventions: Intervention Not Indicated Transportation Interventions: [facility])  Readmission Risk Interventions    12/02/2019    1:47 PM  Readmission Risk Prevention Plan  Post Dischage Appt Complete  Medication Screening Complete  Transportation Screening Complete

## 2021-12-18 NOTE — Plan of Care (Signed)
  Problem: Education: Goal: Ability to demonstrate management of disease process will improve Outcome: Progressing Goal: Ability to verbalize understanding of medication therapies will improve Outcome: Progressing Goal: Individualized Educational Video(s) Outcome: Progressing   Problem: Activity: Goal: Capacity to carry out activities will improve Outcome: Progressing   Problem: Cardiac: Goal: Ability to achieve and maintain adequate cardiopulmonary perfusion will improve Outcome: Progressing   Problem: Education: Goal: Knowledge of General Education information will improve Description: Including pain rating scale, medication(s)/side effects and non-pharmacologic comfort measures Outcome: Progressing   Problem: Health Behavior/Discharge Planning: Goal: Ability to manage health-related needs will improve Outcome: Progressing   Problem: Clinical Measurements: Goal: Ability to maintain clinical measurements within normal limits will improve Outcome: Progressing Goal: Diagnostic test results will improve Outcome: Progressing Goal: Respiratory complications will improve Outcome: Progressing Goal: Cardiovascular complication will be avoided Outcome: Progressing   Problem: Activity: Goal: Risk for activity intolerance will decrease Outcome: Progressing   Problem: Nutrition: Goal: Adequate nutrition will be maintained Outcome: Progressing   Problem: Safety: Goal: Ability to remain free from injury will improve Outcome: Progressing   Problem: Activity: Goal: Ability to tolerate increased activity will improve Outcome: Progressing   Problem: Respiratory: Goal: Ability to maintain a clear airway and adequate ventilation will improve Outcome: Progressing   Problem: Role Relationship: Goal: Method of communication will improve Outcome: Progressing

## 2021-12-19 DIAGNOSIS — Z86711 Personal history of pulmonary embolism: Secondary | ICD-10-CM | POA: Diagnosis not present

## 2021-12-19 DIAGNOSIS — E662 Morbid (severe) obesity with alveolar hypoventilation: Secondary | ICD-10-CM | POA: Diagnosis not present

## 2021-12-19 DIAGNOSIS — E785 Hyperlipidemia, unspecified: Secondary | ICD-10-CM | POA: Diagnosis not present

## 2021-12-19 DIAGNOSIS — I5033 Acute on chronic diastolic (congestive) heart failure: Secondary | ICD-10-CM | POA: Diagnosis not present

## 2021-12-19 DIAGNOSIS — E871 Hypo-osmolality and hyponatremia: Secondary | ICD-10-CM | POA: Diagnosis not present

## 2021-12-19 DIAGNOSIS — J9601 Acute respiratory failure with hypoxia: Secondary | ICD-10-CM | POA: Diagnosis not present

## 2021-12-19 DIAGNOSIS — I10 Essential (primary) hypertension: Secondary | ICD-10-CM | POA: Diagnosis not present

## 2021-12-19 DIAGNOSIS — G9341 Metabolic encephalopathy: Secondary | ICD-10-CM | POA: Diagnosis not present

## 2021-12-19 NOTE — Progress Notes (Addendum)
Speech Language Pathology Treatment: [name] Speaking valve  Patient Details Name: Angel Brewer MRN: [id number] DOB: 06-01-1966 Today's Date: 12/19/2021 Time: 0902-0916 SLP Time Calculation (min) (ACUTE ONLY): 14 min  Assessment / Plan / Recommendation Clinical Impression  Pt has demonstrated significant improvement in therapy and has met her goals. Dysphagia goals were met several weeks ago and today she showed independence with her PMV. She was able to donn/doff her valve without visual feedback with a mirror. SLP only assisted in moving the trach collar to the side and she manipulated valve on and off adequately. She was also able to tell therapist that she is not supposed to and doesn't sleep with her valve. She was instructed to call for assistance if she has any difficulty with valve placement or removal. Continue to wear valv ewiht all meals/snacks/meds.ST will discharge from services-please reconsult if the need arises.    HPI HPI: Angel Brewer is a 56 y/o female who presented on 5/11 with SOB, admitted with acute on chronic heart failure. Had to be tx to ICU secondary to hypercarbic respiratory failure with increased obtundation, intubated 5/15, s/p trach 11/29/21, weaned from vent and tolerating TC trials 5/23. PMH includes HTN, recurrent PE on Eliquis, morbid obesity, DVT, radial ORIF and wrist ligament reconstruction.      SLP Plan  All goals met;Discharge SLP treatment due to (comment)      Recommendations for follow up therapy are one component of a multi-disciplinary discharge planning process, led by the attending physician.  Recommendations may be updated based on patient status, additional functional criteria and insurance authorization.    Recommendations         Patient may use Passy-Muir Speech Valve: During all waking hours (remove during sleep) PMSV Supervision: Intermittent         Oral Care Recommendations: Oral care BID Follow Up Recommendations:   (TBD) Assistance recommended at discharge: Set up Supervision/Assistance SLP Visit Diagnosis: Aphonia (R49.1) Plan: All goals met;Discharge SLP treatment due to (comment)           [place] Siren  12/19/2021, 9:43 AM

## 2021-12-19 NOTE — Progress Notes (Signed)
Heart Failure Stewardship Pharmacist Progress Note   PCP: [name], NP PCP-Cardiologist: None    HPI:  56 yo female with PMH of OSA on CPAP, diastolic CHF, HLD, and DVT 2/2 pregnancy 2017, and pulmonary HTN.  Presented to ED with progressive dyspnea at rest and on exertion, orthopnea with LEE on exam.  CXR with moderate interstitial lung markings indicative of moderate/severe CHF and pulmonary edema.  Echo 05/12 with LVEF 60-65%, mild LVH, normal RV - unchanged since 04/2021 Echo.  Adherent to PTA furosemide.  Started on IV diuretics.  Became less responsive and developed respiratory acidosis despite being on bipap and was then intubated on 5/13.  Ongoing discussions for extubation vs trach - failed SBT 05/16, 05/17, 05/18. Trach placed 5/19.   Patient reported dysuria and vaginal itching 05/24 since foley removal and was started on Ceftriaxone x5d for empiric UTI and fluconazole x2 doses for possible yeast infection; afebrile, WBC wnl.    Repeat CXR on 05/25 showed moderate interstitial pulmonary edema (L > R alveolar opacity), largely unchanged from previous imaging.  Weaned off tube feeds 05/30 and is somewhat tolerating PO diet so far.  Pending LTACH bed in [place].  Current HF Medications: Diuretic: furosemide 40 mg daily Aldosterone Antagonist: spironolactone 25 mg daily SGLT2i: Farxiga 10 mg daily  Prior to admission HF Medications: Diuretic: furosemide 40 mg daily  Pertinent Lab Values: As of 6/5: Serum creatinine 0.79 (baseline <1), BUN 22, Potassium 3.8, Sodium 133, BNP 139.2, Magnesium 2.8  Vital Signs: Weight: 413 lbs (admission weight: 433.4 lbs)  05/07/2021 discharge weight: 401 lb Blood pressure: 110-120/80s Heart rate: 60-80s I/O: -775m yesterday; cumulative net - 19.2L  Medication Assistance / Insurance Benefits Check: Does the patient have prescription insurance?  Yes Type of insurance plan: Managed Medicaid - Entresto/SGLT2i require PA  Outpatient  Pharmacy:  [facility] [place] Is the patient willing [facility]   Pending    Assessment: 1. Acute on chronic diastolic CHF (LVEF 634-94%. NYHA class II symptoms. - Continue furosemide 40 mg PO daily - Continue spironolactone 25 mg daily - Continue Farxiga 10 mg daily - no h/o UTIs in the past.  Reported dysuria symptoms likely due to foley removal as she had no other symptoms, fever and WBC was wnl.  - No BB - Off amlodipine - Further GDMT optimization previously limited with trach/cortrak status    Plan: 1) Medication changes recommended at this time: - Continue current regimen  2) Patient assistance: - Farxiga PA approved  3)  Education  - Patient has been educated on current HF medications and potential additions to HF medication regimen - Patient verbalizes understanding that over the next few months, these medication doses may change and more medications may be added to optimize HF regimen - Patient has been educated on basic disease state pathophysiology and goals of therapy  MKerby Nora PharmD, BCPS Heart Failure Stewardship Pharmacist Phone ([phone] Please check [email or web address] for unit-specific pharmacy phone numbers.

## 2021-12-19 NOTE — Plan of Care (Signed)
  Problem: Education: Goal: Ability to demonstrate management of disease process will improve Outcome: Progressing Goal: Ability to verbalize understanding of medication therapies will improve Outcome: Progressing Goal: Individualized Educational Video(s) Outcome: Progressing   Problem: Cardiac: Goal: Ability to achieve and maintain adequate cardiopulmonary perfusion will improve Outcome: Progressing   Problem: Education: Goal: Knowledge of General Education information will improve Description: Including pain rating scale, medication(s)/side effects and non-pharmacologic comfort measures Outcome: Progressing   Problem: Health Behavior/Discharge Planning: Goal: Ability to manage health-related needs will improve Outcome: Progressing   Problem: Clinical Measurements: Goal: Ability to maintain clinical measurements within normal limits will improve Outcome: Progressing Goal: Diagnostic test results will improve Outcome: Progressing Goal: Respiratory complications will improve Outcome: Progressing Goal: Cardiovascular complication will be avoided Outcome: Progressing   Problem: Activity: Goal: Risk for activity intolerance will decrease Outcome: Progressing   Problem: Nutrition: Goal: Adequate nutrition will be maintained Outcome: Progressing   Problem: Coping: Goal: Level of anxiety will decrease Outcome: Progressing   Problem: Safety: Goal: Ability to remain free from injury will improve Outcome: Progressing   Problem: Skin Integrity: Goal: Risk for impaired skin integrity will decrease Outcome: Progressing   Problem: Respiratory: Goal: Ability to maintain a clear airway and adequate ventilation will improve Outcome: Progressing   Problem: Role Relationship: Goal: Method of communication will improve Outcome: Progressing

## 2021-12-19 NOTE — Progress Notes (Signed)
Progress Note  Patient: Angel Brewer AUE:[id number] DOB: [birth year]  DOA: 11/21/2021  DOS: 12/19/2021    Brief hospital course: Angel Brewer was admitted to the hospital with the working diagnosis of acute on chronic diastolic heart failure, complicated with acute hypoxemic respiratory failure.   Angel Brewer is a 56 y.o. female with medical history significant for HFpEF (last EF 60-65% October 2022), history of recurrent PE on Eliquis, pulmonary hypertension, HTN, HLD, OSA/OHS on CPAP, obesity class 3. Reported 2 days of worsening dyspnea, orthopnea and cough. On her initial physical examination her blood pressure was 144/83, HR 78, RR 34, 02 saturation 94% on 4 L/min per [place]. Distant breath sounds, with no wheezing, heart with S1 and S2 present and rhythmic, abdomen not distended, positive lower extremity edema.   Chest radiograph with cardiomegaly, bilateral interstitial infiltrates, congested hilar vasculature and cephalization of the vasculature.   5/11 initial admission to hospitalist service.  Started on BiPAP and diuresed successfully for 5 L. 5/13 increasing somnolence and hypercarbia new since admission. 5/15 am intubated. Discussed trach with son who wanted to give her a chance of extubation before proceeding straight to trach 5/16 failed SBT for tachypnea 5/17 failed SBT for tachypnea 5/18 failed SBT for rapid and shallow breathing despite high PS 5/19 tracheostomy placed 5/24 Placed on trach collar mid morning of 5/23 and has remained on ATC since  5/25 stable on TC  Transferred from PCCM to [facility] service 5/27  Patient is medically stable to be transfer to rehab. She still has cuffed trach that probably need to be changed to uncuffed before discharge. Follow up with pulmonary recommendations.   Assessment and Plan: Acute hypoxemic and hypercapnic respiratory failure due to acute cardiogenic pulmonary edema due to acute on chronic HFpEF and OHS: - s/p tracheostomy 5/19 > ATC 5/24  on 10L/40% FiO2, currently with #6 [place] uncuffed, using PMV. Management per PCCM weekly and prn. Not likely to decannulate given her use of trilogy at home.  Acute on chronic HFpEF, HTN: Echocardiogram with preserved LV systolic function 60 to 23%, RV systolic function is preserved, no significant valvular disease.  - Restarted home lasix 82m daily. Appears roughly euvolemic s/p significant diuresis earlier in hospitalization. - Continue spironolactone, dose augmented, and dapagliflozin.  - Recheck labs 6/10  OHS, OSA:  - Weight loss recommended, encouraged to mobilize as much as possible to avoid hypoventilation/atelectasis.   Morbid obesity: Body mass index is 64.81 kg/m.  - Given comorbidities that could be impacted by weight loss, would recommend bariatric surgery evaluation if patient amenable.   History of recurrent PE:  - Continue eliquis.  Anxiety:  - Continue clonazepam low dose scheduled  HLD:   - Continue atorvastatin   Hyponatremia: Mild, stable.    Acute metabolic encephalopathy: Resolved.   Leukocytosis: Resolved without targeted treatment.  Subjective: Feels encouraged as she states she took several steps yesterday. No new complaints.   Objective: Vitals:   12/19/21 0500 12/19/21 0750 12/19/21 0813 12/19/21 1153  BP:  112/72    Pulse:  69 68 82  Resp:  _0 Temp:  97.8 F (36.6 C)    TempSrc:  Oral    SpO2:  95% 97% 98%  Weight: (!) 187.7 kg     Height:       Gen: 56y.o. female in no distress Pulm: Diminished but clear. CV: Regular rate and rhythm. No murmur, rub, or gallop. No JVD, trace dependent edema. GI: Abdomen soft, non-tender, non-distended, with normoactive  bowel sounds.  Ext: Warm, no deformities Skin: No rashes, lesions or ulcers on visualized skin. Neuro: Alert and oriented. No focal neurological deficits. Psych: Judgement and insight appear fair. Mood euthymic & affect congruent. Behavior is appropriate.    Data Personally  reviewed: CBC: Recent Labs  Lab 12/16/21 0430  WBC 5.9  HGB 11.1*  HCT 36.0  MCV 93.8  PLT 643   Basic Metabolic Panel: Recent Labs  Lab 12/14/21 0408 12/16/21 0430  NA 134* 133*  K 4.2 3.8  CL 90* 91*  CO2 39* 37*  GLUCOSE 104* 94  BUN 32* 22*  CREATININE 0.88 0.79  CALCIUM 9.5 9.2   Family Communication: None at bedside  Disposition: Status is: Inpatient Remains inpatient appropriate because: Unsafe DC; medically stable Planned Discharge Destination: Pending SNF placement, likely not to occur prior to 28 days from trach placement on 5/19 (which will be 6/15).  Patrecia Pour, MD 12/19/2021 3:06 PM Page by [email or web address]

## 2021-12-20 DIAGNOSIS — I5033 Acute on chronic diastolic (congestive) heart failure: Secondary | ICD-10-CM | POA: Diagnosis not present

## 2021-12-20 DIAGNOSIS — Z86711 Personal history of pulmonary embolism: Secondary | ICD-10-CM | POA: Diagnosis not present

## 2021-12-20 DIAGNOSIS — J9601 Acute respiratory failure with hypoxia: Secondary | ICD-10-CM | POA: Diagnosis not present

## 2021-12-20 DIAGNOSIS — E871 Hypo-osmolality and hyponatremia: Secondary | ICD-10-CM | POA: Diagnosis not present

## 2021-12-20 DIAGNOSIS — I10 Essential (primary) hypertension: Secondary | ICD-10-CM | POA: Diagnosis not present

## 2021-12-20 DIAGNOSIS — E662 Morbid (severe) obesity with alveolar hypoventilation: Secondary | ICD-10-CM | POA: Diagnosis not present

## 2021-12-20 DIAGNOSIS — G9341 Metabolic encephalopathy: Secondary | ICD-10-CM | POA: Diagnosis not present

## 2021-12-20 DIAGNOSIS — E785 Hyperlipidemia, unspecified: Secondary | ICD-10-CM | POA: Diagnosis not present

## 2021-12-20 NOTE — Progress Notes (Signed)
Progress Note  Patient: Angel Brewer [id number]:[id number] DOB: 11-16-1965  DOA: 11/21/2021  DOS: 12/20/2021    Brief hospital course: [name] was admitted to the hospital with the working diagnosis of acute on chronic diastolic heart failure, complicated with acute hypoxemic respiratory failure.   Angel Brewer is a 56 y.o. female with medical history significant for HFpEF (last EF 60-65% October 2022), history of recurrent PE on Eliquis, pulmonary hypertension, HTN, HLD, OSA/OHS on CPAP, obesity class 3. Reported 2 days of worsening dyspnea, orthopnea and cough. On her initial physical examination her blood pressure was 144/83, HR 78, RR 34, 02 saturation 94% on 4 L/min per [place]. Distant breath sounds, with no wheezing, heart with S1 and S2 present and rhythmic, abdomen not distended, positive lower extremity edema.   Chest radiograph with cardiomegaly, bilateral interstitial infiltrates, congested hilar vasculature and cephalization of the vasculature.   5/11 initial admission to hospitalist service.  Started on BiPAP and diuresed successfully for 5 L. 5/13 increasing somnolence and hypercarbia new since admission. 5/15 am intubated. Discussed trach with son who wanted to give her a chance of extubation before proceeding straight to trach 5/16 failed SBT for tachypnea 5/17 failed SBT for tachypnea 5/18 failed SBT for rapid and shallow breathing despite high PS 5/19 tracheostomy placed 5/24 Placed on trach collar mid morning of 5/23 and has remained on ATC since  5/25 stable on TC  Transferred from PCCM to [facility] service 5/27  Patient is medically stable to be transfer to rehab. She still has cuffed trach that probably need to be changed to uncuffed before discharge. Follow up with pulmonary recommendations.   Assessment and Plan: Acute hypoxemic and hypercapnic respiratory failure due to acute cardiogenic pulmonary edema due to acute on chronic HFpEF and OHS: - s/p tracheostomy 5/19 > ATC 5/24  on 10L/40% FiO2, currently with #6 Shiley uncuffed, using PMV. Management per PCCM weekly and prn. Not likely to decannulate given her use of trilogy at home.  Acute on chronic HFpEF, HTN: Echocardiogram with preserved LV systolic function 60 to 81%, RV systolic function is preserved, no significant valvular disease.  - Restarted home lasix 20m daily. Appears roughly euvolemic s/p significant diuresis earlier in hospitalization. - Continue spironolactone, dose augmented, and dapagliflozin.  - Will recheck labs 6/10  OHS, OSA:  - Weight loss recommended, encouraged to mobilize as much as possible to avoid hypoventilation/atelectasis.   Morbid obesity: Body mass index is 64.81 kg/m.  - Given comorbidities that could be impacted by weight loss, would recommend bariatric surgery evaluation if patient amenable.   History of recurrent PE:  - Continue eliquis.  Anxiety:  - Continue clonazepam low dose scheduled  HLD:   - Continue atorvastatin   Hyponatremia: Mild, stable.    Acute metabolic encephalopathy: Resolved.   Leukocytosis: Resolved without targeted treatment.  Subjective: No new issues, wants to get up again OOB.  Objective: Vitals:   12/19/21 2329 12/20/21 0335 12/20/21 0807 12/20/21 0817  BP:    103/63  Pulse: 72 70 78 66  Resp: _0 Temp:    97.8 F (36.6 C)  TempSrc:    Oral  SpO2: 92% 94% 94% 94%  Weight:      Height:       Gen: 56y.o. female in no distress Pulm: Nonlabored breathing, Clear. CV: Regular rate and rhythm. No murmur, rub, or gallop. No JVD, no dependent edema. GI: Abdomen soft, non-tender, non-distended, with normoactive bowel sounds.  Ext: Warm, no  deformities Skin: No rashes, lesions or ulcers on visualized skin. Neuro: Alert and oriented. No focal neurological deficits. Psych: Judgement and insight appear fair. Mood euthymic & affect congruent. Behavior is appropriate.    Data Personally reviewed: CBC: Recent Labs  Lab  12/16/21 0430  WBC 5.9  HGB 11.1*  HCT 36.0  MCV 93.8  PLT 216   Basic Metabolic Panel: Recent Labs  Lab 12/14/21 0408 12/16/21 0430  NA 134* 133*  K 4.2 3.8  CL 90* 91*  CO2 39* 37*  GLUCOSE 104* 94  BUN 32* 22*  CREATININE 0.88 0.79  CALCIUM 9.5 9.2   Family Communication: None at bedside  Disposition: Status is: Inpatient Remains inpatient appropriate because: Unsafe DC; medically stable Planned Discharge Destination: Pending SNF placement, likely not to occur prior to 28 days from trach placement on 5/19 (which will be 6/15).  Patrecia Pour, MD 12/20/2021 10:05 AM Page by [email or web address]

## 2021-12-20 NOTE — TOC Progression Note (Signed)
Transition of Care [facility]) - Progression Note    Patient Details  Name: Angel Brewer MRN: [id number] Date of Birth: September 07, 1965  Transition of Care [facility]) CM/SW [place], RN Phone Number: 12/20/2021, 11:34 AM  Clinical Narrative:    CM met with the patient at the bedside to introduce myself and goals of care with patient for SNF placement for short term rehabilitation services.  The patient states that she lives alone and is agreeable to SNF placement - once tracheostomy is >26 days old.  The patient states that she was living alone prior to admission in a two-story home with master bedroom downstairs.  The patient states she was using CPAP prior to admission with RA and needed no oxygen prior to arrival.  [employer] was the providing dme company.  The patient does not have available family members at this time to provide care in the home and [facility] plans to seek SNF placement once trach is 69 days old  - trach placed on 11/29/2021.  CM and MSW with DTP Team continue to follow the patient for SNF placement - no available bed offers until after 12/30/21 - barriers include obesity, trach, and managed Medicaid.   Expected Discharge Plan: [place] Barriers to Discharge: No SNF bed, Continued Medical Work up  Expected Discharge Plan and Services Expected Discharge Plan: [place] In-house Referral: PCP / [facility] Discharge Planning Services: CM Consult Post Acute Care Choice: [place] Living arrangements for the past 2 months: [place]                   DME Agency: [employer]                   Social Determinants of Health (SDOH) Interventions Food Insecurity Interventions: Intervention Not Indicated Transportation Interventions: [facility])  Readmission Risk Interventions    12/20/2021   11:33 AM 12/02/2019    1:47 PM  Readmission Risk Prevention Plan   Post Dischage Appt  Complete  Medication Screening  Complete  Transportation Screening Complete Complete  PCP or Specialist Appt within 3-5 Days Complete   HRI or [place] Complete   Social Work Consult for [place] Planning/Counseling Complete   Palliative Care Screening Not Applicable   Medication Review Press photographer) Complete

## 2021-12-20 NOTE — Progress Notes (Signed)
Heart Failure Stewardship Pharmacist Progress Note   PCP: [name], NP PCP-Cardiologist: None    HPI:  56 yo female with PMH of OSA on CPAP, diastolic CHF, HLD, and DVT 2/2 pregnancy 2017, and pulmonary HTN.  Presented to ED with progressive dyspnea at rest and on exertion, orthopnea with LEE on exam.  CXR with moderate interstitial lung markings indicative of moderate/severe CHF and pulmonary edema.  Echo 05/12 with LVEF 60-65%, mild LVH, normal RV - unchanged since 04/2021 Echo.  Adherent to PTA furosemide.  Started on IV diuretics.  Became less responsive and developed respiratory acidosis despite being on bipap and was then intubated on 5/13.  Ongoing discussions for extubation vs trach - failed SBT 05/16, 05/17, 05/18. Trach placed 5/19.   Patient reported dysuria and vaginal itching 05/24 since foley removal and was started on Ceftriaxone x5d for empiric UTI and fluconazole x2 doses for possible yeast infection; afebrile, WBC wnl.    Repeat CXR on 05/25 showed moderate interstitial pulmonary edema (L > R alveolar opacity), largely unchanged from previous imaging.  Weaned off tube feeds 05/30 and is somewhat tolerating PO diet so far.  Pending LTACH bed in [place].  Current HF Medications: Diuretic: furosemide 40 mg daily Aldosterone Antagonist: spironolactone 25 mg daily SGLT2i: Farxiga 10 mg daily  Prior to admission HF Medications: Diuretic: furosemide 40 mg daily  Pertinent Lab Values: As of 6/5: Serum creatinine 0.79 (baseline <1), BUN 22, Potassium 3.8, Sodium 133, BNP 139.2, Magnesium 2.8  Vital Signs: Weight: 413 lbs (admission weight: 433.4 lbs)  05/07/2021 discharge weight: 401 lb Blood pressure: 100/60s Heart rate: 60-80s I/O: -537m yesterday; cumulative net - 20.3L  Medication Assistance / Insurance Benefits Check: Does the patient have prescription insurance?  Yes Type of insurance plan: Managed Medicaid - Entresto/SGLT2i require PA  Outpatient  Pharmacy:  Prior to admission [facility] Is the patient willing to use [facility]? Pending Is the patient willing to transition their outpatient pharmacy [facility]   Pending    Assessment: 1. Acute on chronic diastolic CHF (LVEF 669-67%. NYHA class II symptoms. - Continue furosemide 40 mg PO daily - Continue spironolactone 25 mg daily - Continue Farxiga 10 mg daily - no h/o UTIs in the past.  Reported dysuria symptoms likely due to foley removal as she had no other symptoms, fever and WBC was wnl.  - No BB - Off amlodipine - Further GDMT optimization previously limited with trach/cortrak status    Plan: 1) Medication changes recommended at this time: - Continue current regimen  2) Patient assistance: - Farxiga PA approved  3)  Education  - Patient has been educated on current HF medications and potential additions to HF medication regimen - Patient verbalizes understanding that over the next few months, these medication doses may change and more medications may be added to optimize HF regimen - Patient has been educated on basic disease state pathophysiology and goals of therapy  MKerby Nora PharmD, BCPS Heart Failure Stewardship Pharmacist Phone [phone] Please check [email or web address] for unit-specific pharmacy phone numbers.

## 2021-12-21 DIAGNOSIS — I5033 Acute on chronic diastolic (congestive) heart failure: Secondary | ICD-10-CM | POA: Diagnosis not present

## 2021-12-21 DIAGNOSIS — E871 Hypo-osmolality and hyponatremia: Secondary | ICD-10-CM | POA: Diagnosis not present

## 2021-12-21 DIAGNOSIS — E785 Hyperlipidemia, unspecified: Secondary | ICD-10-CM | POA: Diagnosis not present

## 2021-12-21 DIAGNOSIS — E662 Morbid (severe) obesity with alveolar hypoventilation: Secondary | ICD-10-CM | POA: Diagnosis not present

## 2021-12-21 DIAGNOSIS — Z86711 Personal history of pulmonary embolism: Secondary | ICD-10-CM | POA: Diagnosis not present

## 2021-12-21 DIAGNOSIS — G9341 Metabolic encephalopathy: Secondary | ICD-10-CM | POA: Diagnosis not present

## 2021-12-21 DIAGNOSIS — J9601 Acute respiratory failure with hypoxia: Secondary | ICD-10-CM | POA: Diagnosis not present

## 2021-12-21 DIAGNOSIS — I10 Essential (primary) hypertension: Secondary | ICD-10-CM | POA: Diagnosis not present

## 2021-12-21 LAB — CBC
HCT: 36.6 % (ref 36.0–46.0)
Hemoglobin: 11 g/dL — ABNORMAL LOW (ref 12.0–15.0)
MCH: 27.9 pg (ref 26.0–34.0)
MCHC: 30.1 g/dL (ref 30.0–36.0)
MCV: 92.9 fL (ref 80.0–100.0)
Platelets: 196 10*3/uL (ref 150–400)
RBC: 3.94 MIL/uL (ref 3.87–5.11)
RDW: 14.3 % (ref 11.5–15.5)
WBC: 4.3 10*3/uL (ref 4.0–10.5)
nRBC: 0 % (ref 0.0–0.2)

## 2021-12-21 LAB — BASIC METABOLIC PANEL
Anion gap: 12 (ref 5–15)
BUN: 21 mg/dL — ABNORMAL HIGH (ref 6–20)
CO2: 33 mmol/L — ABNORMAL HIGH (ref 22–32)
Calcium: 9.7 mg/dL (ref 8.9–10.3)
Chloride: 93 mmol/L — ABNORMAL LOW (ref 98–111)
Creatinine, Ser: 0.92 mg/dL (ref 0.44–1.00)
GFR, Estimated: >60 mL/min (ref >60)
Glucose, Bld: 86 mg/dL (ref 70–99)
Potassium: 4.1 mmol/L (ref 3.5–5.1)
Sodium: 138 mmol/L (ref 135–145)

## 2021-12-21 LAB — MAGNESIUM: Magnesium: 2 mg/dL (ref 1.7–2.4)

## 2021-12-21 NOTE — Progress Notes (Signed)
Progress Note  Patient: Angel Brewer ALP:[id number] DOB: Sep 29, 1965  DOA: 11/21/2021  DOS: 12/21/2021    Brief hospital course: Angel Brewer was admitted to the hospital with the working diagnosis of acute on chronic diastolic heart failure, complicated with acute hypoxemic respiratory failure.   Angel Brewer is a 56 y.o. female with medical history significant for HFpEF (last EF 60-65% October 2022), history of recurrent PE on Eliquis, pulmonary hypertension, HTN, HLD, OSA/OHS on CPAP, obesity class 3. Reported 2 days of worsening dyspnea, orthopnea and cough. On her initial physical examination her blood pressure was 144/83, HR 78, RR 34, 02 saturation 94% on 4 L/min per [place]. Distant breath sounds, with no wheezing, heart with S1 and S2 present and rhythmic, abdomen not distended, positive lower extremity edema.   Chest radiograph with cardiomegaly, bilateral interstitial infiltrates, congested hilar vasculature and cephalization of the vasculature.   5/11 initial admission to hospitalist service.  Started on BiPAP and diuresed successfully for 5 L. 5/13 increasing somnolence and hypercarbia new since admission. 5/15 am intubated. Discussed trach with son who wanted to give her a chance of extubation before proceeding straight to trach 5/16 failed SBT for tachypnea 5/17 failed SBT for tachypnea 5/18 failed SBT for rapid and shallow breathing despite high PS 5/19 tracheostomy placed 5/24 Placed on trach collar mid morning of 5/23 and has remained on ATC since  5/25 stable on TC  Transferred from PCCM to [facility] service 5/27  Patient is medically stable to be transfer to rehab. She still has cuffed trach that probably need to be changed to uncuffed before discharge. Follow up with pulmonary recommendations.   Assessment and Plan: Acute hypoxemic and hypercapnic respiratory failure due to acute cardiogenic pulmonary edema due to acute on chronic HFpEF and OHS: - s/p tracheostomy 5/19 > ATC  5/24 on 10L/40% FiO2, currently with #6 Shiley uncuffed, using PMV. Management per PCCM weekly and prn. Not likely to decannulate given her use of trilogy at home.  Acute on chronic HFpEF, HTN: Echocardiogram with preserved LV systolic function 60 to 35%, RV systolic function is preserved, no significant valvular disease.  - Restarted home lasix 21m daily. Appears roughly euvolemic s/p significant diuresis earlier in hospitalization. - Continue spironolactone, dose augmented, and dapagliflozin.  - Labs stable.  OHS, OSA:  - Weight loss recommended, encouraged to mobilize as much as possible to avoid hypoventilation/atelectasis.   Morbid obesity: Body mass index is 64.81 kg/m.  - Given comorbidities that could be impacted by weight loss, would recommend bariatric surgery evaluation if patient amenable.   History of recurrent PE:  - Continue eliquis.  Anxiety:  - Continue clonazepam low dose scheduled  HLD:   - Continue atorvastatin   Hyponatremia: Mild, stable.    Acute metabolic encephalopathy: Resolved.   Leukocytosis: Resolved without targeted treatment.  Subjective: Denies any new issues. Eager to continue to work with PT/get OOB.  Objective: Vitals:   12/21/21 0357 12/21/21 0833 12/21/21 0918 12/21/21 1318  BP:   110/77   Pulse: 64 (!) 58 (!) 57 70  Resp: _0 Temp:   97.8 F (36.6 C)   TempSrc:   Oral   SpO2: 94% 95% 98% 98%  Weight:      Height:      Gen: 56y.o. female in no distress Pulm: Nonlabored breathing w/PMV. Clear. CV: Regular rate and rhythm. No murmur, rub, or gallop. No JVD, no dependent edema. GI: Abdomen soft, non-tender, non-distended, with normoactive bowel sounds.  Ext: Warm, no deformities Skin: No rashes, lesions or ulcers on visualized skin. Neuro: Alert and oriented. No focal neurological deficits. Psych: Judgement and insight appear fair. Mood euthymic & affect congruent. Behavior is appropriate.    Data Personally  reviewed: CBC: Recent Labs  Lab 12/16/21 0430 12/21/21 0913  WBC 5.9 4.3  HGB 11.1* 11.0*  HCT 36.0 36.6  MCV 93.8 92.9  PLT 234 710   Basic Metabolic Panel: Recent Labs  Lab 12/16/21 0430 12/21/21 0913  NA 133* 138  K 3.8 4.1  CL 91* 93*  CO2 37* 33*  GLUCOSE 94 86  BUN 22* 21*  CREATININE 0.79 0.92  CALCIUM 9.2 9.7  MG  --  2.0   Family Communication: None at bedside  Disposition: Status is: Inpatient Remains inpatient appropriate because: Unsafe DC; medically stable Planned Discharge Destination: Pending SNF placement, likely not to occur prior to 28 days from trach placement on 5/19 (which will be 6/15).  Patrecia Pour, MD 12/21/2021 2:37 PM Page by [email or web address]

## 2021-12-22 DIAGNOSIS — E785 Hyperlipidemia, unspecified: Secondary | ICD-10-CM | POA: Diagnosis not present

## 2021-12-22 DIAGNOSIS — G9341 Metabolic encephalopathy: Secondary | ICD-10-CM | POA: Diagnosis not present

## 2021-12-22 DIAGNOSIS — Z86711 Personal history of pulmonary embolism: Secondary | ICD-10-CM | POA: Diagnosis not present

## 2021-12-22 DIAGNOSIS — E871 Hypo-osmolality and hyponatremia: Secondary | ICD-10-CM | POA: Diagnosis not present

## 2021-12-22 DIAGNOSIS — E662 Morbid (severe) obesity with alveolar hypoventilation: Secondary | ICD-10-CM | POA: Diagnosis not present

## 2021-12-22 DIAGNOSIS — J9601 Acute respiratory failure with hypoxia: Secondary | ICD-10-CM | POA: Diagnosis not present

## 2021-12-22 DIAGNOSIS — I10 Essential (primary) hypertension: Secondary | ICD-10-CM | POA: Diagnosis not present

## 2021-12-22 DIAGNOSIS — I5033 Acute on chronic diastolic (congestive) heart failure: Secondary | ICD-10-CM | POA: Diagnosis not present

## 2021-12-22 NOTE — Progress Notes (Signed)
Progress Note  Patient: Angel Brewer [id number]:[id number] DOB: 05/15/1966  DOA: 11/21/2021  DOS: 12/22/2021    Brief hospital course: Angel Brewer was admitted to the hospital with the working diagnosis of acute on chronic diastolic heart failure, complicated with acute hypoxemic respiratory failure.   Angel Brewer is a 56 y.o. female with medical history significant for HFpEF (last EF 60-65% October 2022), history of recurrent PE on Eliquis, pulmonary hypertension, HTN, HLD, OSA/OHS on CPAP, obesity class 3. Reported 2 days of worsening dyspnea, orthopnea and cough. On her initial physical examination her blood pressure was 144/83, HR 78, RR 34, 02 saturation 94% on 4 L/min per Angel Brewer. Distant breath sounds, with no wheezing, heart with S1 and S2 present and rhythmic, abdomen not distended, positive lower extremity edema.   Chest radiograph with cardiomegaly, bilateral interstitial infiltrates, congested hilar vasculature and cephalization of the vasculature.   5/11 initial admission to hospitalist service.  Started on BiPAP and diuresed successfully for 5 L. 5/13 increasing somnolence and hypercarbia new since admission. 5/15 am intubated. Discussed trach with son who wanted to give her a chance of extubation before proceeding straight to trach 5/16 failed SBT for tachypnea 5/17 failed SBT for tachypnea 5/18 failed SBT for rapid and shallow breathing despite high PS 5/19 tracheostomy placed 5/24 Placed on trach collar mid morning of 5/23 and has remained on ATC since  5/25 stable on TC  Transferred from PCCM to [facility] service 5/27  Patient is medically stable to be transfer to rehab. She still has cuffed trach that probably need to be changed to uncuffed before discharge. Follow up with pulmonary recommendations.   Assessment and Plan: Acute hypoxemic and hypercapnic respiratory failure due to acute cardiogenic pulmonary edema due to acute on chronic HFpEF and OHS: - s/p tracheostomy 5/19 > ATC  5/24 on 10L/40% FiO2, currently with #6 Shiley uncuffed, using PMV. Management per PCCM weekly and prn. Not likely to decannulate given her use of trilogy at home.  Acute on chronic HFpEF, HTN: Echocardiogram with preserved LV systolic function 60 to 37%, RV systolic function is preserved, no significant valvular disease.  - Restarted home lasix 73m daily. Appears roughly euvolemic s/p significant diuresis earlier in hospitalization. - Continue spironolactone and dapagliflozin.  - Labs stable.  OHS, OSA:  - Weight loss recommended, encouraged to mobilize as much as possible to avoid hypoventilation/atelectasis.   Morbid obesity: Body mass index is 64.81 kg/m.  - Given comorbidities that could be impacted by weight loss, would recommend bariatric surgery evaluation if patient amenable.   History of recurrent PE:  - Continue eliquis.  Anxiety:  - Continue clonazepam low dose scheduled  HLD:   - Continue atorvastatin   Hyponatremia: Mild, stable.    Acute metabolic encephalopathy: Resolved.   Leukocytosis: Resolved without targeted treatment.  Subjective: No new issues, eager to get OOB. No dyspnea or chest pain.  Objective: Vitals:   12/22/21 0315 12/22/21 0848 12/22/21 0851 12/22/21 1101  BP:  111/82    Pulse: 63 (!) 55 60 64  Resp: _0 Temp:  98.1 F (36.7 C)    TempSrc:  Oral    SpO2: 95% 100% 96% 98%  Weight:      Height:      Gen: 56y.o. female in no distress Pulm: Nonlabored breathing thru trach with PMV. Clear. CV: Regular rate and rhythm. No murmur, rub, or gallop. No JVD, no dependent edema. GI: Abdomen soft, non-tender, non-distended, with normoactive bowel sounds.  Ext: Warm, no deformities Skin: No rashes, lesions or ulcers on visualized skin. Neuro: Alert and oriented. No focal neurological deficits. Psych: Judgement and insight appear fair. Mood euthymic & affect congruent. Behavior is appropriate.     Data Personally reviewed: CBC: Recent  Labs  Lab 12/16/21 0430 12/21/21 0913  WBC 5.9 4.3  HGB 11.1* 11.0*  HCT 36.0 36.6  MCV 93.8 92.9  PLT 234 161   Basic Metabolic Panel: Recent Labs  Lab 12/16/21 0430 12/21/21 0913  NA 133* 138  K 3.8 4.1  CL 91* 93*  CO2 37* 33*  GLUCOSE 94 86  BUN 22* 21*  CREATININE 0.79 0.92  CALCIUM 9.2 9.7  MG  --  2.0   Family Communication: None at bedside  Disposition: Status is: Inpatient Remains inpatient appropriate because: Unsafe DC; medically stable Planned Discharge Destination: Pending SNF placement, likely not to occur prior to 30 days from trach placement on 5/19   Angel Pour, MD 12/22/2021 11:33 AM Angel Brewer by [email or web address]

## 2021-12-23 DIAGNOSIS — I5033 Acute on chronic diastolic (congestive) heart failure: Secondary | ICD-10-CM | POA: Diagnosis not present

## 2021-12-23 NOTE — Progress Notes (Signed)
Upon my arrival pt was not hooked up to continuous pulse ox. When pt placed on my portable pulse ox while I searched for her probe, SPo2 read 71%. Pt had ATC on but was hooked up to Room Air. I then found probe to place on continuous to check accuracy and Spo2 was 73% on continuous with great pleth. Switched pt over to oxygen and 98% ATC and PMSV was removed. Pt then coughed when I removed valve but refused for me to suction. I told her she sounded slightly congested and we needed to get the secretions out. She stated "it will come out". PT was left on 98% until SPO2 rose and then was slowly titrated back down to where she is now on 35% with SPo2 of 92%. Speaking valve was left off and pt agrees to allow it to stay off for a [place] while her oxygen continues to come off. I also asked if I could change her inner cannula and patient stated "no they just changed it". No more coughing noted and SPo2 stable at this time. Pt aware to keep probe on finger and shook her head to agree.

## 2021-12-23 NOTE — Progress Notes (Signed)
Physical Therapy Treatment Patient Details Name: Angel Brewer MRN: [id number] DOB: 1965/08/06 Today's Date: 12/23/2021   History of Present Illness Pt is a 56 y.o. female admitted 11/21/21 with SOB; workup for acute on chronic HF. Transfer to ICU secondary to hypercarbic respiratory failure with increased obtundation. ETT 5/15; s/p trach 5/19; trach collar 5/23. PMH includes HTN, DVT, recurrent PE on Eliquis, morbid obesity, wrist ligament reconstruction.    PT Comments    Pt received supine in air bed, eager to progress OOB mobility and c/o abdominal discomfort throughout, pt requiring increased time/effort to perform all functional mobility tasks. Pt with good efforts for all tasks, able to perform bed mobility and transfers with up to +2 modA and able to stand with +1-2 minA for brief periods of time in front of bed/chair and pivot OOB without use of lift today. RN notified she may need lift for return to bed from chair if too fatigued to reattempt after sitting up. Pt reports moderate fatigue at end of session (5/10 modified RPE). Pt continues to benefit from PT services to progress toward functional mobility goals.   Recommendations for follow up therapy are one component of a multi-disciplinary discharge planning process, led by the attending physician.  Recommendations may be updated based on patient status, additional functional criteria and insurance authorization.  Follow Up Recommendations  Skilled nursing-short term rehab (<3 hours/day) (would benefit from AIR despite lack of support as pt is highly motivated and making great progress lately)     Assistance Recommended at Discharge Frequent or constant Supervision/Assistance  Patient can return home with the following Two people to help with walking and/or transfers;Two people to help with bathing/dressing/bathroom;Help with stairs or ramp for entrance;Assist for transportation;Direct supervision/assist for financial  management;Assistance with Education officer, environmental (measurements PT);Wheelchair cushion (measurements PT);Hospital bed;Rolling walker (2 wheels)    Recommendations for Other Services       Precautions / Restrictions Precautions Precautions: Fall Precaution Comments: trach 5L 28% FiO2, PSMV Restrictions Weight Bearing Restrictions: No     Mobility  Bed Mobility Overal bed mobility: Needs Assistance Bed Mobility: Rolling Rolling: Min assist   Supine to sit: Mod assist, +2 for physical assistance, HOB elevated     General bed mobility comments: pt completing with use of bed rails, increased time/effort and frequent cues for body mechanics/UE placement    Transfers Overall transfer level: Needs assistance Equipment used: Rolling walker (2 wheels) (bariatric) Transfers: Sit to/from Stand, Bed to chair/wheelchair/BSC Sit to Stand: Min assist, +2 physical assistance, From elevated surface   Step pivot transfers: Min assist, +2 physical assistance       General transfer comment: cues for pursed-lip breathing, step sequencing and use of RW, pt hesitant to keep BUE on RW handles, frequently trying to grasp RN hand, with mod cues and increased time pt gripping RW and therapist assist for managing RW while pt taking pivotal steps to bari recliner at bedside. STS x 1 rep from EOB and from recliner x2 reps to RW.    Ambulation/Gait Ambulation/Gait assistance: Min assist, +2 physical assistance   Assistive device: Rolling walker (2 wheels) (bari RW) Gait Pattern/deviations: Step-to pattern, Wide base of support, Decreased stride length, Shuffle Gait velocity: decreased   Pre-gait activities: standing hip flexion x10 reps (limited due to pt loose stools) General Gait Details: pt with small steps and wide BOS, only achieves minimal clearance with standing hip flexion and pivotal steps ~61f from bed>chair.  Balance Overall balance assessment:  Needs assistance Sitting-balance support: Feet unsupported, Bilateral upper extremity supported   Sitting balance - Comments: sitting up in recliner. Able to scoot forward with min assists   Standing balance support: Bilateral upper extremity supported Standing balance-[name]: Fair Standing balance comment: can static stand without UE support, BUE support for gait                            Cognition Arousal/Alertness: Awake/alert Behavior During Therapy: WFL for tasks assessed/performed, Anxious Overall Cognitive Status: Within Functional Limits for tasks assessed                                 General Comments: pt remains motivated with therapy, anxious at prospect of OOB for first time in multiple days (hadn't been OOB since prior to transfer to 2W >3 days ago).        Exercises General Exercises - Lower Extremity Ankle Circles/Pumps: AROM, Both, 10 reps, Supine Other Exercises Other Exercises: seated BLE AROM: hip flexion, LAQ x10 reps in chair Other Exercises: STS x 3 reps and standing hip flexion x10 reps    General Comments General comments (skin integrity, edema, etc.): FiO2 setting 28% 5L/min O2, SpO2 WFL, HR WFL.      Pertinent Vitals/Pain Pain Assessment Pain Assessment: Faces Faces Pain Scale: Hurts [place] more Pain Location: discomfort from loose stools (peri area), BLE (knees) with prolonged standing Pain Descriptors / Indicators: Discomfort, Grimacing Pain Intervention(s): Monitored during session, Limited activity within patient's tolerance, Repositioned           PT Goals (current goals can now be found in the care plan section) Acute Rehab PT Goals Patient Stated Goal: get back to work PT Goal Formulation: With patient Time For Goal Achievement: 12/30/21 Progress towards PT goals: Progressing toward goals    Frequency    Min 3X/week (increased as pt plans to d/c home)      PT Plan Current plan remains  appropriate       AM-PAC PT "6 Clicks" Mobility   Outcome Measure  Help needed turning from your back to your side while in a flat bed without using bedrails?: A Lot Help needed moving from lying on your back to sitting on the side of a flat bed without using bedrails?: A Lot Help needed moving to and from a bed to a chair (including a wheelchair)?: A Lot Help needed standing up from a chair using your arms (e.g., wheelchair or bedside chair)?: A Lot Help needed to walk in hospital room?: Total (unable to complete 20 ft) Help needed climbing 3-5 steps with a railing? : Total 6 Click Score: 10    End of Session Equipment Utilized During Treatment: Oxygen;Gait belt (O2 via trach mask, bed sheet rolled and wrapped around waist for gait belt) Activity Tolerance: Patient tolerated treatment well;Other (comment) (limited due to loose stools and pt abdominal discomfort) Patient left: with call bell/phone within reach;in chair;Other (comment) (RN notified pt wants to sit up 1-2 hours) Nurse Communication: Mobility status;Need for lift equipment (pt can stand for maxi-move pad to be placed under her or can stand pivot from bed<>chair with +2 assist and bari RW if air is taken out of bed for ease of re-entry to bed. pt c/o loose stools) PT Visit Diagnosis: Muscle weakness (generalized) (M62.81);Pain Pain - Right/Left: Right Pain - part of body:  Knee     Time: 1211-1249 PT Time Calculation (min) (ACUTE ONLY): 38 min  Charges:  $Therapeutic Exercise: 8-22 mins $Therapeutic Activity: 23-37 mins                     Kolter Reaver P., PTA Acute Rehabilitation Services Secure Chat Preferred 9a-5:30pm Office: [place] 12/23/2021, 2:34 PM

## 2021-12-23 NOTE — Progress Notes (Signed)
   NAMEKaitlynn Brewer, MRN:  [id number], DOB:  02-05-1966, LOS: 44 ADMISSION DATE:  11/21/2021, CONSULTATION DATE: 11/23/2021 REFERRING MD:  Benson Norway, CHIEF COMPLAINT: Obtundation  History of Present Illness:  56 year old woman who was admitted with acute on chronic hypoxic/hypercapnic respiratory failure, requiring intubation and now s/p trach    Pertinent  Medical History   Past Medical History:  Diagnosis Date   DVT (deep vein thrombosis) in pregnancy    RLE DVT 02/2016   Dyspnea    Morbid obesity ([place])    PE (pulmonary embolism) 02/29/2016   Sleep apnea    uses a cpap    Significant Hospital Events: Including procedures, antibiotic start and stop dates in addition to other pertinent events   5/11 initial admission to hospitalist service.  Started on BiPAP and diuresed successfully for 5 L. 5/13 increasing somnolence and hypercarbia new since admission. 5/15 am intubated. Discussed trach with son who wanted to give her a chance of extubation before proceeding straight to trach 5/16 failed SBT for tachypnea 5/17 failed SBT for tachypnea 5/18 failed SBT for rapid and shallow breathing despite high PS 5/19 tracheostomy placed 5/24 Placed on trach collar mid morning of 5/23 and has remained on ATC since  5/25 Remains on ATC this am, continues to have secretions but appears to be managing them well   Interim History / Subjective:  Tolerating PMV well. On 28% FiO2 this AM, I turned down to room air and she maintained sats of 95-96% for 4 - 5 minutes.  Have asked RN to check back in on her shortly.  Objective   Blood pressure 121/73, pulse 97, temperature 98.5 F (36.9 C), temperature source Oral, resp. rate 18, height _0  (1.702 m), weight (!) 187.7 kg, last menstrual period 11/29/2015, SpO2 93 %.    FiO2 (%):  [28 %-35 %] 28 %   Intake/Output Summary (Last 24 hours) at 12/23/2021 0758 Last data filed at 12/23/2021 0500 Gross per 24 hour  Intake --  Output 902 ml  Net  -902 ml    Filed Weights   12/17/21 0524 12/18/21 0511 12/19/21 0500  Weight: (!) 185.4 kg (!) 185 kg (!) 187.7 kg    Examination: General: Adult female, resting in bed, in NAD. Neuro: A&O x 3, no deficits. HEENT: [place]/AT. Sclerae anicteric. EOMI. Trach C/D/I on room air. PMV in place. Cardiovascular: RRR, no M/R/G.  Lungs: Respirations even and unlabored.  CTA bilaterally, No W/R/R. Abdomen: Obese. BS x 4, soft, NT/ND.  Musculoskeletal: No gross deformities, no edema.  Skin: Intact, warm, no rashes.   Assessment & Plan:   Acute on chronic respiratory failure with hypoxia and hypercapnia Obstructive sleep apnea/obesity hypoventilation syndrome-tracheostomy tube in place  Continue ATC, I have weaned to room air this AM and asked RN to check in frequently on her (she maintained sats of 95-96% while I was in the room) Continue PMV I do not think patient is a candidate for decannulation considering she uses trilogy at home  Rest of the management per primary team  PCCM will continue to follow once per week. Please call back if needs arise sooner.    Montey Hora, [place] [facility] For pager details, please see AMION or use Epic chat  After 1900, please call [place] for cross coverage needs 12/23/2021, 8:03 AM

## 2021-12-23 NOTE — TOC Progression Note (Addendum)
Transition of Care [facility]) - Progression Note    Patient Details  Name: Angel Brewer MRN: [id number] Date of Birth: 05-20-1966  Transition of Care [facility]) CM/SW [place], RN Phone Number: 12/23/2021, 10:02 AM  Clinical Narrative:    CM and MSW with DTP Team will continue to follow the patient for SNF placement - trach will need to be 21 days old before SNF placement can be established.  Barriers at this time include Tracheostomy site <65 days old, bariatric needs >400 lbs., and Managed Medicaid provider.  CM left a message with Ebony Hail, CM with [facility] facilities to check on bed availability for [facility], [facility] or [facility] - unable to provide needs for trach.  I called and spoke with Maudie Mercury, Admission's Director with [facility] facilities and she will review the patient's clinicals later this week - since she is unable to offer placement until the patient's [name] is . 16 days old.  CM and MSW will continue to follow the patient for needed SNF placement.   Expected Discharge Plan: [place] Barriers to Discharge: No SNF bed, Continued Medical Work up  Expected Discharge Plan and Services Expected Discharge Plan: [place] In-house Referral: PCP / [facility] Discharge Planning Services: CM Consult Post Acute Care Choice: [place] Living arrangements for the past 2 months: [place]                   DME Agency: [facility] [employer]                   Social Determinants of Health (SDOH) Interventions Food Insecurity Interventions: Intervention Not Indicated Transportation Interventions: [facility])  Readmission Risk Interventions    12/20/2021   11:33 AM 12/02/2019    1:47 PM  Readmission Risk Prevention Plan  Post Dischage Appt  Complete  Medication Screening  Complete  Transportation Screening Complete Complete  PCP or Specialist Appt within  3-5 Days Complete   HRI or [place] Complete   Social Work Consult for [place] Planning/Counseling Complete   Palliative Care Screening Not Applicable   Medication Review [employer]) Complete

## 2021-12-23 NOTE — Progress Notes (Signed)
Physical Therapy Treatment Patient Details Name: Angel Brewer MRN: [id number] DOB: 1966-01-30 Today's Date: 12/23/2021   History of Present Illness Pt is a 56 y.o. female admitted 11/21/21 with SOB; workup for acute on chronic HF. Transfer to ICU secondary to hypercarbic respiratory failure with increased obtundation. ETT 5/15; s/p trach 5/19; trach collar 5/23. PMH includes HTN, DVT, recurrent PE on Eliquis, morbid obesity, wrist ligament reconstruction.    PT Comments    Pt received in recliner, agreeable to therapy session with goal of getting back to bed from chair, RN called PTA back into room to assist per pt request. Discussed bringing bari [facility] to room prior to mobilizing however pt defers, upon standing pt with another episode of bowel incontinence, needing totalA for peri-care. Pt needing up to +2 minA for transfers and increased time and mod cues for safety/improved body mechanics and modA +2 for bed mobility. Pt RN x2 present in room at end of session to assist pt with hygiene. Plan to progress standing/gait tolerance with chair follow next session. Pt continues to benefit from PT services to progress toward functional mobility goals.    Recommendations for follow up therapy are one component of a multi-disciplinary discharge planning process, led by the attending physician.  Recommendations may be updated based on patient status, additional functional criteria and insurance authorization.  Follow Up Recommendations  Skilled nursing-short term rehab (<3 hours/day) (would benefit from AIR despite lack of support as pt is highly motivated and making great progress lately)     Assistance Recommended at Discharge Frequent or constant Supervision/Assistance  Patient can return home with the following Two people to help with walking and/or transfers;Two people to help with bathing/dressing/bathroom;Help with stairs or ramp for entrance;Assist for transportation;Direct supervision/assist for  financial management;Assistance with Education officer, environmental (measurements PT);Wheelchair cushion (measurements PT);Hospital bed;Rolling walker (2 wheels) (bariatric size for all equipment)    Recommendations for Other Services       Precautions / Restrictions Precautions Precautions: Fall Precaution Comments: trach 5L 28% FiO2, PSMV, bowel incontinence Restrictions Weight Bearing Restrictions: No     Mobility  Bed Mobility Overal bed mobility: Needs Assistance Bed Mobility: Rolling Rolling: Min assist   Supine to sit: Mod assist, +2 for physical assistance, HOB elevated Sit to supine: Mod assist, +2 for safety/equipment   General bed mobility comments: increased assist due to pt anxiety/loose stools, pt needs assist to lift BLE over EOB but able to initiate    Transfers Overall transfer level: Needs assistance Equipment used: Rolling walker (2 wheels) (bariatric) Transfers: Sit to/from Stand, Bed to chair/wheelchair/BSC Sit to Stand: Min assist, +2 physical assistance   Step pivot transfers: Min assist, +2 physical assistance       General transfer comment: cues for pursed-lip breathing, step sequencing and use of bari RW, pt hesitant to keep BUE on RW handles, therapist assist for managing RW while pt taking pivotal steps to bari recliner at bedside. Second person present to assist with peri-care as pt incontinent with watery BM throughout standing; no BSC in room that would fit, so PTA brought once to room after she pivoted back to bed. Pt needs 2 attempts to stand from [employer] using momentum strategy and transfer pad utilized for lift assist.    Ambulation/Gait Ambulation/Gait assistance: Min assist, +2 physical assistance Gait Distance (Feet): 6 Feet Assistive device: Rolling walker (2 wheels) (bari RW) Gait Pattern/deviations: Step-to pattern, Wide base of support, Decreased stride length, Shuffle Gait velocity: decreased  Pre-gait activities: standing hip flexion x10 reps (limited due to pt loose stools) General Gait Details: pt with small steps and wide BOS, only achieves minimal foot clearance with pivotal steps ~34f from chair>bed then ~352fsidesteps to R [facility] St[facility]             Wheelchair Mobility    Modified Rankin (Stroke Patients Only)       Balance Overall balance assessment: Needs assistance Sitting-balance support: Feet unsupported, Bilateral upper extremity supported   Sitting balance - Comments: sitting up in recliner. Able to scoot forward with min assists   Standing balance support: Bilateral upper extremity supported Standing balance-[name]: Fair Standing balance comment: can static stand without UE support, BUE support for gait                            Cognition Arousal/Alertness: Awake/alert Behavior During Therapy: WFL for tasks assessed/performed, Anxious Overall Cognitive Status: Within Functional Limits for tasks assessed                                 General Comments: pt remains motivated with therapy, anxious due to loose stools during mobility, she needs bariatric briefs for mobility progression but we don't have any size above XXL in dept.        Exercises General Exercises - Lower Extremity Ankle Circles/Pumps: AROM, Both, 10 reps, Supine Other Exercises Other Exercises: seated BLE AROM: hip flexion, LAQ x10 reps in chair Other Exercises: STS x 3 reps and standing hip flexion x10 reps    General Comments General comments (skin integrity, edema, etc.): FiO2 28% 5L O2 trach mask, HR reading tachy with pulse ox but noisy signal, pt HR WFL per manual pulse RN took.      Pertinent Vitals/Pain Pain Assessment Pain Assessment: Faces Faces Pain Scale: Hurts even more Pain Location: discomfort from loose stools (peri area) and abdomen Pain Descriptors / Indicators: Discomfort, Grimacing, Guarding Pain  Intervention(s): Limited activity within patient's tolerance, Monitored during session, Repositioned    Home Living                          Prior Function            PT Goals (current goals can now be found in the care plan section) Acute Rehab PT Goals Patient Stated Goal: get back to work PT Goal Formulation: With patient Time For Goal Achievement: 12/30/21 Progress towards PT goals: Progressing toward goals    Frequency    Min 3X/week (increased as pt plans to d/c home)      PT Plan Current plan remains appropriate    Co-evaluation              AM-PAC PT "6 Clicks" Mobility   Outcome Measure  Help needed turning from your back to your side while in a flat bed without using bedrails?: A [place] Help needed moving from lying on your back to sitting on the side of a flat bed without using bedrails?: A Lot Help needed moving to and from a bed to a chair (including a wheelchair)?: A Lot Help needed standing up from a chair using your arms (e.g., wheelchair or bedside chair)?: A Lot (+2 assist and mod cues) Help needed to walk in hospital room?: Total (unable to complete 20 ft) Help needed climbing  3-5 steps with a railing? : Total 6 Click Score: 11    End of Session Equipment Utilized During Treatment: Oxygen (O2 via trach mask) Activity Tolerance: Treatment limited secondary to medical complications (Comment);Patient limited by pain (limited due to bowel incontinence and pt abdominal discomfort) Patient left: with call bell/phone within reach;Other (comment);in bed;with nursing/sitter in room (RN x2 present to assist pt with clean-up after significant amount of bowel incontinence) Nurse Communication: Mobility status (bari drop arm BSC in her room for next time she needs to get up, pt anxious about needing to be cleaned up in the bed.) PT Visit Diagnosis: Muscle weakness (generalized) (M62.81);Pain Pain - Right/Left: Right Pain - part of body: Knee (and  abdomen)     Time: 3606-7703 PT Time Calculation (min) (ACUTE ONLY): 20 min  Charges:   $Therapeutic Activity: 8-22 mins                     Sarahjane Matherly P., PTA Acute Rehabilitation Services Secure Chat Preferred 9a-5:30pm Office: [place] 12/23/2021, 5:42 PM

## 2021-12-23 NOTE — Progress Notes (Signed)
Heart Failure Stewardship Pharmacist Progress Note   PCP: [name], NP PCP-Cardiologist: None    HPI:  56 yo female with PMH of OSA on CPAP, diastolic CHF, HLD, and DVT 2/2 pregnancy 2017, and pulmonary HTN.  Presented to ED with progressive dyspnea at rest and on exertion, orthopnea with LEE on exam.  CXR with moderate interstitial lung markings indicative of moderate/severe CHF and pulmonary edema.  Echo 05/12 with LVEF 60-65%, mild LVH, normal RV - unchanged since 04/2021 Echo.  Adherent to PTA furosemide.  Started on IV diuretics.  Became less responsive and developed respiratory acidosis despite being on bipap and was then intubated on 5/13.  Ongoing discussions for extubation vs trach - failed SBT 05/16, 05/17, 05/18. Trach placed 5/19.   Patient reported dysuria and vaginal itching 05/24 since foley removal and was started on Ceftriaxone x5d for empiric UTI and fluconazole x2 doses for possible yeast infection; afebrile, WBC wnl.    Repeat CXR on 05/25 showed moderate interstitial pulmonary edema (L > R alveolar opacity), largely unchanged from previous imaging.  Weaned off tube feeds 05/30 and is somewhat tolerating PO diet so far.  Pending LTACH bed.  Current HF Medications: Diuretic: furosemide 40 mg daily Aldosterone Antagonist: spironolactone 25 mg daily SGLT2i: Farxiga 10 mg daily  Prior to admission HF Medications: Diuretic: furosemide 40 mg daily  Pertinent Lab Values: As of 6/10: Serum creatinine 0.92 (baseline <1), BUN 21, Potassium 4.1, Sodium 133, BNP 139.2, Magnesium 2.0  Vital Signs: Weight: 413 lbs (admission weight: 433.4 lbs)  05/07/2021 discharge weight: 401 lb Blood pressure: 120/70s Heart rate: 70-90s  Medication Assistance / Insurance Benefits Check: Does the patient have prescription insurance?  Yes Type of insurance plan: Managed Medicaid - Entresto/SGLT2i require PA  Outpatient Pharmacy:  Prior to admission outpatient [facility] Is the  patient willing to use [facility] at discharge? Pending Is the patient willing to transition their outpatient pharmacy to utilize a [facility] outpatient pharmacy?   Pending    Assessment: 1. Acute on chronic diastolic CHF (LVEF 98-42%). NYHA class II symptoms. - Continue furosemide 40 mg PO daily - Continue spironolactone 25 mg daily - Continue Farxiga 10 mg daily - no h/o UTIs in the past.  Reported dysuria symptoms likely due to foley removal as she had no other symptoms, fever and WBC was wnl.  - No BB - Off amlodipine - Further GDMT optimization previously limited with trach/cortrak status    Plan: 1) Medication changes recommended at this time: - Continue current regimen  2) Patient assistance: - Farxiga PA approved  3)  Education  - Patient has been educated on current HF medications and potential additions to HF medication regimen - Patient verbalizes understanding that over the next few months, these medication doses may change and more medications may be added to optimize HF regimen - Patient has been educated on basic disease state pathophysiology and goals of therapy  Kerby Nora, PharmD, BCPS Heart Failure Stewardship Pharmacist Phone [phone]  Please check [email or web address] for unit-specific pharmacy phone numbers.

## 2021-12-23 NOTE — Progress Notes (Signed)
Progress Note  Patient: Angel Brewer [id number]:[id number] DOB: 25-Jul-1965  DOA: 11/21/2021  DOS: 12/23/2021    Brief hospital course: Angel Brewer was admitted to the hospital with the working diagnosis of acute on chronic diastolic heart failure, complicated with acute hypoxemic respiratory failure.   Angel Brewer is a 56 y.o. female with medical history significant for HFpEF (last EF 60-65% October 2022), history of recurrent PE on Eliquis, pulmonary hypertension, HTN, HLD, OSA/OHS on CPAP, obesity class 3. Reported 2 days of worsening dyspnea, orthopnea and cough. On her initial physical examination her blood pressure was 144/83, HR 78, RR 34, 02 saturation 94% on 4 L/min per [place]. Distant breath sounds, with no wheezing, heart with S1 and S2 present and rhythmic, abdomen not distended, positive lower extremity edema.   Chest radiograph with cardiomegaly, bilateral interstitial infiltrates, congested hilar vasculature and cephalization of the vasculature.   5/11 initial admission to hospitalist service.  Started on BiPAP and diuresed successfully for 5 L. 5/13 increasing somnolence and hypercarbia new since admission. 5/15 am intubated. Discussed trach with son who wanted to give her a chance of extubation before proceeding straight to trach 5/16 failed SBT for tachypnea 5/17 failed SBT for tachypnea 5/18 failed SBT for rapid and shallow breathing despite high PS 5/19 tracheostomy placed 5/24 Placed on trach collar mid morning of 5/23 and has remained on ATC since  5/25 stable on trach collar 5/27 Transferred from PCCM to [facility] service   Patient remains medically stable to be transfer to rehab. She still has cuffed trach that probably need to be changed to uncuffed before discharge. Follow up with pulmonary for further recommendations.   Assessment and Plan: Acute hypoxemic and hypercapnic respiratory failure due to acute cardiogenic pulmonary edema due to acute on chronic HFpEF and OHS: - s/p  tracheostomy 5/19 > ATC 5/24 on 10L/40% FiO2, currently with #6 Shiley uncuffed, using PMV. Management per PCCM weekly and prn. Not likely to decannulate given her use of trilogy at home.  Acute on chronic HFpEF, HTN: Echocardiogram with preserved LV systolic function 60 to 22%, RV systolic function is preserved, no significant valvular disease.  - Restarted home lasix 62m daily. Appears roughly euvolemic s/p significant diuresis earlier in hospitalization. - Continue spironolactone and dapagliflozin.  - Labs stable.  OHS, OSA:  - Weight loss recommended, encouraged to mobilize as much as possible to avoid hypoventilation/atelectasis.   Morbid obesity: Body mass index is 64.81 kg/m.  - Given comorbidities that could be impacted by weight loss, would recommend bariatric surgery evaluation if patient amenable.   History of recurrent PE:  - Continue eliquis.  Anxiety:  - Continue clonazepam low dose scheduled  HLD:   - Continue atorvastatin   Hyponatremia: Mild, stable.    Acute metabolic encephalopathy: Resolved.   Leukocytosis: Resolved without targeted treatment.  Subjective: No acute issues or events overnight  Objective: Vitals:   12/22/21 1950 12/22/21 2057 12/23/21 0004 12/23/21 0401  BP: (!) 128/52     Pulse: 74 69 72 72  Resp: _0 (!) 22  Temp: 97.8 F (36.6 C)     TempSrc: Oral     SpO2: 100% 97% 93% 93%  Weight:      Height:      Gen: 56y.o. female in no distress Pulm: Nonlabored breathing thru trach with PMV. Clear. CV: Regular rate and rhythm. No murmur, rub, or gallop. No JVD, no dependent edema. GI: Abdomen soft, non-tender, non-distended, with normoactive bowel sounds.  Ext: Warm,  no deformities Skin: No rashes, lesions or ulcers on visualized skin. Neuro: Alert and oriented. No focal neurological deficits. Psych: Judgement and insight appear fair. Mood euthymic & affect congruent. Behavior is appropriate.     Data Personally  reviewed: CBC: Recent Labs  Lab 12/21/21 0913  WBC 4.3  HGB 11.0*  HCT 36.6  MCV 92.9  PLT 349    Basic Metabolic Panel: Recent Labs  Lab 12/21/21 0913  NA 138  K 4.1  CL 93*  CO2 33*  GLUCOSE 86  BUN 21*  CREATININE 0.92  CALCIUM 9.7  MG 2.0    Family Communication: None at bedside  Disposition: Status is: Inpatient Remains inpatient appropriate because: Unsafe DC; medically stable Planned Discharge Destination: Pending SNF placement, likely not to occur prior to 30 days from trach placement on 5/19   Parham Ishikawa, MD 12/23/2021 7:35 AM Page by [email or web address]

## 2021-12-24 DIAGNOSIS — I5033 Acute on chronic diastolic (congestive) heart failure: Secondary | ICD-10-CM | POA: Diagnosis not present

## 2021-12-24 NOTE — Plan of Care (Signed)
  Problem: Education: Goal: Ability to demonstrate management of disease process will improve Outcome: Progressing Goal: Ability to verbalize understanding of medication therapies will improve Outcome: Progressing Goal: Individualized Educational Video(s) Outcome: Progressing   Problem: Activity: Goal: Capacity to carry out activities will improve Outcome: Progressing   Problem: Cardiac: Goal: Ability to achieve and maintain adequate cardiopulmonary perfusion will improve Outcome: Progressing   Problem: Education: Goal: Knowledge of General Education information will improve Description: Including pain rating scale, medication(s)/side effects and non-pharmacologic comfort measures Outcome: Progressing   Problem: Health Behavior/Discharge Planning: Goal: Ability to manage health-related needs will improve Outcome: Progressing   Problem: Clinical Measurements: Goal: Ability to maintain clinical measurements within normal limits will improve Outcome: Progressing Goal: Diagnostic test results will improve Outcome: Progressing Goal: Respiratory complications will improve Outcome: Progressing Goal: Cardiovascular complication will be avoided Outcome: Progressing   Problem: Activity: Goal: Risk for activity intolerance will decrease Outcome: Progressing   Problem: Nutrition: Goal: Adequate nutrition will be maintained Outcome: Progressing   Problem: Coping: Goal: Level of anxiety will decrease Outcome: Progressing   Problem: Elimination: Goal: Will not experience complications related to bowel motility Outcome: Progressing Goal: Will not experience complications related to urinary retention Outcome: Progressing   Problem: Safety: Goal: Ability to remain free from injury will improve Outcome: Progressing   Problem: Skin Integrity: Goal: Risk for impaired skin integrity will decrease Outcome: Progressing   Problem: Activity: Goal: Ability to tolerate increased  activity will improve Outcome: Progressing   Problem: Respiratory: Goal: Ability to maintain a clear airway and adequate ventilation will improve Outcome: Progressing   Problem: Role Relationship: Goal: Method of communication will improve Outcome: Progressing

## 2021-12-24 NOTE — Progress Notes (Signed)
Occupational Therapy Treatment Patient Details Name: Angel Brewer MRN: [id number] DOB: Dec 02, 1965 Today's Date: 12/24/2021   History of present illness Pt is a 56 y.o. female admitted 11/21/21 with SOB; workup for acute on chronic HF. Transfer to ICU secondary to hypercarbic respiratory failure with increased obtundation. ETT 5/15; s/p trach 5/19; trach collar 5/23. PMH includes HTN, DVT, recurrent PE on Eliquis, morbid obesity, wrist ligament reconstruction.   OT comments  Patient up in recliner upon entry. Patient was able to stand from recliner with min assist of 2, nursing tech assisted.  Patient able to take steps forward and back toward recliner with RW and min assist. Patient performed standing x2 more with use of bed rail and assist of one to stand. Patient had bowel incontinence with each stand and required max assist for cleaning. Patient is making good progress and will continue to be followed by acute OT.    Recommendations for follow up therapy are one component of a multi-disciplinary discharge planning process, led by the attending physician.  Recommendations may be updated based on patient status, additional functional criteria and insurance authorization.    Follow Up Recommendations  Skilled nursing-short term rehab (<3 hours/day)    Assistance Recommended at Discharge Frequent or constant Supervision/Assistance  Patient can return home with the following  Two people to help with walking and/or transfers;Two people to help with bathing/dressing/bathroom;Assistance with cooking/housework;Direct supervision/assist for medications management;Assist for transportation;Help with stairs or ramp for entrance   Equipment Recommendations  Other (comment) (TBD)    Recommendations for Other Services      Precautions / Restrictions Precautions Precautions: Fall Precaution Comments: trach 5L 28% FiO2, PSMV, bowel incontinence Restrictions Weight Bearing Restrictions: No        Mobility Bed Mobility Overal bed mobility: Needs Assistance             General bed mobility comments: OOB in recliner    Transfers Overall transfer level: Needs assistance Equipment used: Rolling walker (2 wheels) (bariatric) Transfers: Sit to/from Stand Sit to Stand: Min assist, +2 physical assistance           General transfer comment: Patient was min assist +2 for sit to stands, once up patient able to take steps and sit with assistance of one     Balance Overall balance assessment: Needs assistance Sitting-balance support: Feet unsupported, Bilateral upper extremity supported Sitting balance-[name]: Poor Sitting balance - Comments: oob in recliner   Standing balance support: Bilateral upper extremity supported Standing balance-[name]: Fair Standing balance comment: reliant on RW when standing                           ADL either performed or assessed with clinical judgement   ADL Overall ADL's : Needs assistance/impaired             Lower Body Bathing: Maximal assistance;Sit to/from stand Lower Body Bathing Details (indicate cue type and reason): stood to clean peri area                       General ADL Comments: patient with bowel incontinance when standing    Extremity/Trunk Assessment              Vision       Perception     Praxis      Cognition Arousal/Alertness: Awake/alert Behavior During Therapy: WFL for tasks assessed/performed, Anxious Overall Cognitive Status: Within Functional Limits for tasks assessed  General Comments: eager to participate it therapy        Exercises      Shoulder Instructions       General Comments      Pertinent Vitals/ Pain       Pain Assessment Pain Assessment: Faces Faces Pain Scale: Hurts even more Pain Location: discomfort from loose stools (peri area) and abdomen Pain Descriptors / Indicators: Discomfort,  Grimacing, Guarding Pain Intervention(s): Monitored during session  Home Living                                          Prior Functioning/Environment              Frequency  Min 2X/week        Progress Toward Goals  OT Goals(current goals can now be found in the care plan section)  Progress towards OT goals: Progressing toward goals  Acute Rehab OT Goals Patient Stated Goal: get better OT Goal Formulation: With patient Time For Goal Achievement: 12/30/21 Potential to Achieve Goals: Good ADL Goals Pt Will Perform Grooming: with set-up;standing Pt Will Perform Upper Body Bathing: with min guard assist;with adaptive equipment;sitting Pt Will Perform Lower Body Bathing: with mod assist;sitting/lateral leans;with adaptive equipment Pt Will Transfer to Toilet: with max assist;with +2 assist;bedside commode Pt Will Perform Toileting - Clothing Manipulation and hygiene: with max assist;with 2+ total assist;sit to/from stand Pt/caregiver will Perform Home Exercise Program: Increased strength;Both right and left upper extremity;With theraband;Independently;With written HEP provided Additional ADL Goal #1: Pt will perform bed mobility to come EOB as precursor to participation in ADL at [place] Discharge plan remains appropriate;Frequency remains appropriate    Co-evaluation                 AM-PAC OT "6 Clicks" Daily Activity     Outcome Measure   Help from another person eating meals?: A [place] Help from another person taking care of personal grooming?: A [place] Help from another person toileting, which includes using toliet, bedpan, or urinal?: A Lot Help from another person bathing (including washing, rinsing, drying)?: A Lot Help from another person to put on and taking off regular upper body clothing?: A Lot Help from another person to put on and taking off regular lower body clothing?: Total 6 Click Score: 13    End of Session Equipment  Utilized During Treatment: Rolling walker (2 wheels);Left knee immobilizer (8 L 40% FiO2 via trach Collar)  OT Visit Diagnosis: Other abnormalities of gait and mobility (R26.89);Unsteadiness on feet (R26.81);Muscle weakness (generalized) (M62.81);Other (comment)   Activity Tolerance Patient tolerated treatment well   Patient Left in chair;with call bell/phone within reach   Nurse Communication Mobility status        Time: 5830-9407 OT Time Calculation (min): 31 min  Charges: OT General Charges $OT Visit: 1 Visit OT Treatments $Self Care/Home Management : 8-22 mins $Therapeutic Activity: 8-22 mins  Lodema Hong, OTA Acute Rehabilitation Services  Pager [phone] Office [place] 12/24/2021, 2:57 PM

## 2021-12-24 NOTE — Progress Notes (Signed)
Heart Failure Stewardship Pharmacist Progress Note   PCP: Angel Pounds, NP PCP-Cardiologist: None    HPI:  56 yo female with PMH of OSA on CPAP, diastolic CHF, HLD, and DVT 2/2 pregnancy 2017, and pulmonary HTN.  Presented to ED with progressive dyspnea at rest and on exertion, orthopnea with LEE on exam.  CXR with moderate interstitial lung markings indicative of moderate/severe CHF and pulmonary edema.  Echo 05/12 with LVEF 60-65%, mild LVH, normal RV - unchanged since 04/2021 Echo.  Adherent to PTA furosemide.  Started on IV diuretics.  Became less responsive and developed respiratory acidosis despite being on bipap and was then intubated on 5/13.  Ongoing discussions for extubation vs trach - failed SBT 05/16, 05/17, 05/18. Trach placed 5/19.   Patient reported dysuria and vaginal itching 05/24 since foley removal and was started on Ceftriaxone x5d for empiric UTI and fluconazole x2 doses for possible yeast infection; afebrile, WBC wnl.    Repeat CXR on 05/25 showed moderate interstitial pulmonary edema (L > R alveolar opacity), largely unchanged from previous imaging.  Weaned off tube feeds 05/30 and is somewhat tolerating PO diet so far.  Pending LTACH bed.  Current HF Medications: Diuretic: furosemide 40 mg daily Aldosterone Antagonist: spironolactone 25 mg daily SGLT2i: Farxiga 10 mg daily  Prior to admission HF Medications: Diuretic: furosemide 40 mg daily  Pertinent Lab Values: As of 6/10: Serum creatinine 0.92 (baseline <1), BUN 21, Potassium 4.1, Sodium 133, BNP 139.2, Magnesium 2.0  Vital Signs: Weight: 413 lbs (admission weight: 433.4 lbs)  05/07/2021 discharge weight: 401 lb Blood pressure: 120/70s Heart rate: 70-100s  Medication Assistance / Insurance Benefits Check: Does the patient have prescription insurance?  Yes Type of insurance plan: Managed Medicaid - Entresto/SGLT2i require PA  Outpatient Pharmacy:  Prior to admission outpatient pharmacy: [facility] Is the  patient willing to use [facility] at discharge? Pending Is the patient willing to transition their outpatient pharmacy to utilize a [facility] outpatient pharmacy?   Pending    Assessment: 1. Acute on chronic diastolic CHF (LVEF 81-82%). NYHA class II symptoms. - Continue furosemide 40 mg PO daily - Continue spironolactone 25 mg daily - Continue Farxiga 10 mg daily - no h/o UTIs in the past.  Reported dysuria symptoms likely due to foley removal as she had no other symptoms, fever and WBC was wnl.  - No BB - Off amlodipine - Further GDMT optimization previously limited with trach/cortrak status    Plan: 1) Medication changes recommended at this time: - Continue current regimen  2) Patient assistance: - Farxiga PA approved  3)  Education  - Patient has been educated on current HF medications and potential additions to HF medication regimen - Patient verbalizes understanding that over the next few months, these medication doses may change and more medications may be added to optimize HF regimen - Patient has been educated on basic disease state pathophysiology and goals of therapy  Kerby Nora, PharmD, BCPS Heart Failure Stewardship Pharmacist Phone [phone]  Please check [email or web address] for unit-specific pharmacy phone numbers.

## 2021-12-24 NOTE — Progress Notes (Signed)
Progress Note  Patient: Angel Brewer [id number] DOB: Sep 08, 1965  DOA: 11/21/2021  DOS: 12/24/2021    Brief hospital course: Mrs. Goebel was admitted to the hospital with the working diagnosis of acute on chronic diastolic heart failure, complicated with acute hypoxemic respiratory failure.   Kare Crissman is a 56 y.o. female with medical history significant for HFpEF (last EF 60-65% October 2022), history of recurrent PE on Eliquis, pulmonary hypertension, HTN, HLD, OSA/OHS on CPAP, obesity class 3. Reported 2 days of worsening dyspnea, orthopnea and cough. On her initial physical examination her blood pressure was 144/83, HR 78, RR 34, 02 saturation 94% on 4 L/min per [place]. Distant breath sounds, with no wheezing, heart with S1 and S2 present and rhythmic, abdomen not distended, positive lower extremity edema.   Chest radiograph with cardiomegaly, bilateral interstitial infiltrates, congested hilar vasculature and cephalization of the vasculature.   5/11 initial admission to hospitalist service.  Started on BiPAP and diuresed successfully for 5 L. 5/13 increasing somnolence and hypercarbia new since admission. 5/15 am intubated. Discussed trach with son who wanted to give her a chance of extubation before proceeding straight to trach 5/16 failed SBT for tachypnea 5/17 failed SBT for tachypnea 5/18 failed SBT for rapid and shallow breathing despite high PS 5/19 tracheostomy placed 5/24 Placed on trach collar mid morning of 5/23 and has remained on ATC since  5/25 stable on trach collar 5/27 Transferred from PCCM to [facility] service  5/28 - Remains stable on tracheostomy managed by pulmonology awaiting transfer to facility (who will not take new trach - much mature at least 30 days per documentation). 6/12-Present - increasing activity levels - able to ambulate OOB with assistance; making good progress with PT  Patient remains medically stable to be transfer to rehab. She still has cuffed trach  that probably need to be changed to uncuffed before discharge. Follow up with pulmonary for further recommendations.   Assessment and Plan: Acute hypoxemic and hypercapnic respiratory failure due to acute cardiogenic pulmonary edema due to acute on chronic HFpEF and OHS: - s/p tracheostomy 5/19 > ATC 5/24 on 10L/40% FiO2, currently with #6 Shiley uncuffed, using PMV. Management per PCCM weekly and prn. Not likely to decannulate given her use of trilogy at home.  Acute on chronic HFpEF, HTN: Echocardiogram with preserved LV systolic function 60 to 94%, RV systolic function is preserved, no significant valvular disease.  - Restarted home lasix 54m daily. Appears roughly euvolemic s/p significant diuresis earlier in hospitalization. - Continue spironolactone and dapagliflozin.  - Labs stable.  OHS, OSA:  - Weight loss recommended, encouraged to mobilize as much as possible to avoid hypoventilation/atelectasis.   Morbid obesity: Body mass index is 64.81 kg/m.  - Given comorbidities that could be impacted by weight loss, would recommend bariatric surgery evaluation if patient amenable.   History of recurrent PE:  - Continue eliquis.  Anxiety:  - Continue clonazepam low dose scheduled  HLD:   - Continue atorvastatin   Hyponatremia: Mild, stable.    Acute metabolic encephalopathy: Resolved.   Leukocytosis: Resolved without targeted treatment.  Subjective: No acute issues or events overnight  Objective: Vitals:   12/23/21 2126 12/24/21 0054 12/24/21 0420 12/24/21 0448  BP: 103/63   125/73  Pulse: (!) 104 83 68 80  Resp: _0 Temp: 98 F (36.7 C)   98.1 F (36.7 C)  TempSrc: Oral   Oral  SpO2: 93% 90% 93% 99%  Weight:      Height:  Gen: 56 y.o. female in no distress Pulm: Nonlabored breathing thru trach with PMV. Clear. CV: Regular rate and rhythm. No murmur, rub, or gallop. No JVD, no dependent edema. GI: Abdomen soft, non-tender, non-distended, with  normoactive bowel sounds.  Ext: Warm, no deformities Skin: No rashes, lesions or ulcers on visualized skin. Neuro: Alert and oriented. No focal neurological deficits. Psych: Judgement and insight appear fair. Mood euthymic & affect congruent. Behavior is appropriate.     Data Personally reviewed: CBC: Recent Labs  Lab 12/21/21 0913  WBC 4.3  HGB 11.0*  HCT 36.6  MCV 92.9  PLT 858    Basic Metabolic Panel: Recent Labs  Lab 12/21/21 0913  NA 138  K 4.1  CL 93*  CO2 33*  GLUCOSE 86  BUN 21*  CREATININE 0.92  CALCIUM 9.7  MG 2.0    Family Communication: None at bedside  Disposition: Status is: Inpatient Remains inpatient appropriate because: Unsafe DC; medically stable Planned Discharge Destination: Pending SNF placement, likely not to occur prior to 30 days from trach placement on 5/19   Fleck Ishikawa, MD 12/24/2021 7:27 AM Page by [email or web address]

## 2021-12-25 ENCOUNTER — Ambulatory Visit: Payer: Medicaid Other | Admitting: Nurse Practitioner

## 2021-12-25 DIAGNOSIS — I5033 Acute on chronic diastolic (congestive) heart failure: Secondary | ICD-10-CM | POA: Diagnosis not present

## 2021-12-25 NOTE — Progress Notes (Signed)
Progress Note  Patient: Angel Brewer TJQ:[id number] DOB: 12/29/1965  DOA: 11/21/2021  DOS: 12/25/2021    Brief hospital course: Angel Brewer was admitted to the hospital with the working diagnosis of acute on chronic diastolic heart failure, complicated with acute hypoxemic respiratory failure.   Angel Brewer is a 56 y.o. female with medical history significant for HFpEF (last EF 60-65% October 2022), history of recurrent PE on Eliquis, pulmonary hypertension, HTN, HLD, OSA/OHS on CPAP, obesity class 3. Reported 2 days of worsening dyspnea, orthopnea and cough. On her initial physical examination her blood pressure was 144/83, HR 78, RR 34, 02 saturation 94% on 4 L/min per [place]. Distant breath sounds, with no wheezing, heart with S1 and S2 present and rhythmic, abdomen not distended, positive lower extremity edema.   Chest radiograph with cardiomegaly, bilateral interstitial infiltrates, congested hilar vasculature and cephalization of the vasculature.   5/11 initial admission to hospitalist service.  Started on BiPAP and diuresed successfully for 5 L. 5/13 increasing somnolence and hypercarbia new since admission. 5/15 am intubated. Discussed trach with son who wanted to give her a chance of extubation before proceeding straight to trach 5/16 failed SBT for tachypnea 5/17 failed SBT for tachypnea 5/18 failed SBT for rapid and shallow breathing despite high PS 5/19 tracheostomy placed 5/24 Placed on trach collar mid morning of 5/23 and has remained on ATC since  5/25 stable on trach collar 5/27 Transferred from PCCM to [facility] service  5/28 - Remains stable on tracheostomy managed by pulmonology awaiting transfer to facility (who will not take new trach - much mature at least 30 days per documentation). 6/12-Present - increasing activity levels - able to ambulate OOB with assistance; making good progress with PT  Patient remains medically stable to be transfer to rehab. She still has cuffed trach  that probably need to be changed to uncuffed before discharge. Follow up with pulmonary for further recommendations.   Assessment and Plan: Acute hypoxemic and hypercapnic respiratory failure due to acute cardiogenic pulmonary edema due to acute on chronic HFpEF and OHS: - s/p tracheostomy 5/19 > ATC 5/24 on 10L/40% FiO2, currently with #6 Shiley uncuffed, using PMV. Management per PCCM weekly and prn. Not likely to decannulate given her use of trilogy at home.  Acute on chronic HFpEF, HTN: Echocardiogram with preserved LV systolic function 60 to 76%, RV systolic function is preserved, no significant valvular disease.  - Restarted home lasix 24m daily. Appears roughly euvolemic s/p significant diuresis earlier in hospitalization. - Continue spironolactone and dapagliflozin.  - Labs stable.  OHS, OSA:  - Weight loss recommended, encouraged to mobilize as much as possible to avoid hypoventilation/atelectasis.   Morbid obesity: Body mass index is 64.81 kg/m.  - Given comorbidities that could be impacted by weight loss, would recommend bariatric surgery evaluation if patient amenable.   History of recurrent PE:  - Continue eliquis.  Anxiety:  - Continue clonazepam low dose scheduled  HLD:   - Continue atorvastatin   Hyponatremia: Mild, stable.    Acute metabolic encephalopathy: Resolved.   Leukocytosis: Resolved without targeted treatment.  Subjective: No acute issues or events overnight  Objective: Vitals:   12/24/21 2026 12/24/21 2319 12/25/21 0349 12/25/21 0715  BP: 116/80     Pulse: 78 64 68 (!) 59  Resp: _0 Temp: 97.9 F (36.6 C)     TempSrc:      SpO2: 93% 93% 94% 97%  Weight:      Height:  Gen: 56 y.o. female in no distress Pulm: Nonlabored breathing thru trach with PMV. Clear. CV: Regular rate and rhythm. No murmur, rub, or gallop. No JVD, no dependent edema. GI: Abdomen soft, non-tender, non-distended, with normoactive bowel sounds.  Ext: Warm,  no deformities Skin: No rashes, lesions or ulcers on visualized skin. Neuro: Alert and oriented. No focal neurological deficits. Psych: Judgement and insight appear fair. Mood euthymic & affect congruent. Behavior is appropriate.     Data Personally reviewed: CBC: Recent Labs  Lab 12/21/21 0913  WBC 4.3  HGB 11.0*  HCT 36.6  MCV 92.9  PLT 166    Basic Metabolic Panel: Recent Labs  Lab 12/21/21 0913  NA 138  K 4.1  CL 93*  CO2 33*  GLUCOSE 86  BUN 21*  CREATININE 0.92  CALCIUM 9.7  MG 2.0    Family Communication: None at bedside  Disposition: Status is: Inpatient Remains inpatient appropriate because: Unsafe DC; medically stable Planned Discharge Destination: Pending SNF placement, likely not to occur prior to 30 days from trach placement on 5/19   Cragun Ishikawa, MD 12/25/2021 7:18 AM Page by [email or web address]

## 2021-12-25 NOTE — Progress Notes (Signed)
Nutrition Follow-up  DOCUMENTATION CODES:   Obesity unspecified  INTERVENTION:  Continue Ensure Enlive po BID, each supplement provides 350 kcal and 20 grams of protein.   Continue MVI with minerals daily  NUTRITION DIAGNOSIS:   Inadequate oral intake related to inability to eat as evidenced by NPO status; diet advanced; improved  GOAL:   Patient will meet greater than or equal to 90% of their needs; met  MONITOR:   PO intake, Supplement acceptance, Labs, Weight trends, Skin, I & O's  REASON FOR ASSESSMENT:   Consult Enteral/tube feeding initiation and management  ASSESSMENT:   56 y.o. female presented to the ED with dyspnea. PMH includes COPD, HTN, and CHF. Pt admitted with acute on chronic heart failure and acute respiratory failure with hypoxia.  5/13 - intubated 5/14 - TF initiated 5/15 - Cortrak placed (tip post-pyloric) 5/19 - Trach placed 5/22 - Cortrak replaced 5/23 - placed on trach collar 5/25- FEES; diet adv to Dys 3/thin  5/29- diet adv to regular 5/30- TF d/c; Cortrak removed  Pt continues on trach collar. Meal completion improved with recent intake of 100% at meals. Pt reports having a good appetite with no difficulties. Pt currently has Ensure ordered and has been consuming them. RD to continue with current orders to aid in caloric and protein needs. Possible plans to transfer to rehab.   Labs and medications reviewed.   Diet Order:   Diet Order             Diet regular Room service appropriate? Yes; Fluid consistency: Thin  Diet effective now                   EDUCATION NEEDS:   Not appropriate for education at this time  Skin:  Skin Assessment: Reviewed RN Assessment  Last BM:  6/13  Height:   Ht Readings from Last 1 Encounters:  12/06/21 5' 7" (1.702 m)    Weight:   Wt Readings from Last 1 Encounters:  12/19/21 (!) 187.7 kg    Ideal Body Weight:  63.6 kg  BMI:  Body mass index is 64.81 kg/m.  Estimated Nutritional  Needs:   Kcal:  1600-1800 kcals  Protein:  140-175 g  Fluid:  >/= 1.6 L  Corrin Parker, MS, RD, LDN RD pager number/after hours weekend pager number on Amion.

## 2021-12-25 NOTE — Progress Notes (Signed)
Heart Failure Stewardship Pharmacist Progress Note   PCP: [name], NP PCP-Cardiologist: None    HPI:  56 yo female with PMH of OSA on CPAP, diastolic CHF, HLD, and DVT 2/2 pregnancy 2017, and pulmonary HTN.  Presented to ED with progressive dyspnea at rest and on exertion, orthopnea with LEE on exam.  CXR with moderate interstitial lung markings indicative of moderate/severe CHF and pulmonary edema.  Echo 05/12 with LVEF 60-65%, mild LVH, normal RV - unchanged since 04/2021 Echo.  Adherent to PTA furosemide.  Started on IV diuretics.  Became less responsive and developed respiratory acidosis despite being on bipap and was then intubated on 5/13.  Ongoing discussions for extubation vs trach - failed SBT 05/16, 05/17, 05/18. Trach placed 5/19.   Patient reported dysuria and vaginal itching 05/24 since foley removal and was started on Ceftriaxone x5d for empiric UTI and fluconazole x2 doses for possible yeast infection; afebrile, WBC wnl.    Repeat CXR on 05/25 showed moderate interstitial pulmonary edema (L > R alveolar opacity), largely unchanged from previous imaging.  Weaned off tube feeds 05/30 and is somewhat tolerating PO diet so far.  Pending LTACH bed.  Current HF Medications: Diuretic: furosemide 40 mg daily Aldosterone Antagonist: spironolactone 25 mg daily SGLT2i: Farxiga 10 mg daily  Prior to admission HF Medications: Diuretic: furosemide 40 mg daily  Pertinent Lab Values: As of 6/10: Serum creatinine 0.92 (baseline <1), BUN 21, Potassium 4.1, Sodium 133, BNP 139.2, Magnesium 2.0  Vital Signs: Weight: 413 lbs (admission weight: 433.4 lbs)  05/07/2021 discharge weight: 401 lb Blood pressure: 120/80s Heart rate: 70s  Medication Assistance / Insurance Benefits Check: Does the patient have prescription insurance?  Yes Type of insurance plan: Managed Medicaid - Entresto/SGLT2i require PA  Outpatient Pharmacy:  Prior to admission [facility] Is the  patient willing to use [facility] at discharge? Pending Is the patient willing to transition their outpatient pharmacy to utilize a [facility] outpatient pharmacy?   Pending    Assessment: 1. Acute on chronic diastolic CHF (LVEF 06-30%). NYHA class II symptoms. - Continue furosemide 40 mg PO daily - Continue spironolactone 25 mg daily - Continue Farxiga 10 mg daily - no h/o UTIs in the past.  Reported dysuria symptoms likely due to foley removal as she had no other symptoms, fever and WBC was wnl.  - No BB - Off amlodipine - Further GDMT optimization previously limited with trach/cortrak status    Plan: 1) Medication changes recommended at this time: - Continue current regimen  2) Patient assistance: - Farxiga PA approved  3)  Education  - Patient has been educated on current HF medications and potential additions to HF medication regimen - Patient verbalizes understanding that over the next few months, these medication doses may change and more medications may be added to optimize HF regimen - Patient has been educated on basic disease state pathophysiology and goals of therapy  Kerby Nora, PharmD, BCPS Heart Failure Stewardship Pharmacist Phone [phone]  Please check [email or web address] for unit-specific pharmacy phone numbers.

## 2021-12-25 NOTE — Progress Notes (Signed)
Physical Therapy Treatment Patient Details Name: Angel Brewer MRN: [id number] DOB: July 01, 1966 Today's Date: 12/25/2021   History of Present Illness Pt is a 56 y.o. female admitted 11/21/21 with SOB; workup for acute on chronic HF. Transfer to ICU secondary to hypercarbic respiratory failure with increased obtundation. ETT 5/15; s/p trach 5/19; trach collar 5/23. PMH includes HTN, DVT, recurrent PE on Eliquis, morbid obesity, wrist ligament reconstruction.    PT Comments    Pt was assisted to chair today with steps and two person support of the activity.  Pt is mildly fearful of moving but once up is quite stable to maneuver.  Repositioned her on chair with LE exercises covered for ROM and power up needs to get up from lower surfaces.  All equipment and table were with pt at end of session with nursing readily available for her.  Follow acutely for goals of PT as outlined on POC.  Sats were stable with all gait and exercises.   Recommendations for follow up therapy are one component of a multi-disciplinary discharge planning process, led by the attending physician.  Recommendations may be updated based on patient status, additional functional criteria and insurance authorization.  Follow Up Recommendations  Skilled nursing-short term rehab (<3 hours/day)     Assistance Recommended at Discharge Frequent or constant Supervision/Assistance  Patient can return home with the following Two people to help with walking and/or transfers;Two people to help with bathing/dressing/bathroom;Help with stairs or ramp for entrance;Assist for transportation;Direct supervision/assist for financial management;Assistance with Education officer, environmental (measurements PT);Wheelchair cushion (measurements PT);Hospital bed;Rolling walker (2 wheels)    Recommendations for Other Services       Precautions / Restrictions Precautions Precautions: Fall Precaution Comments: trach 5L 28%  FiO2, PSMV, bowel incontinence Restrictions Weight Bearing Restrictions: No     Mobility  Bed Mobility Overal bed mobility: Needs Assistance Bed Mobility: Supine to Sit Rolling: Min assist   Supine to sit: Mod assist, +2 for physical assistance, +2 for safety/equipment     General bed mobility comments: mod assist to support LUE and trunk    Transfers Overall transfer level: Needs assistance Equipment used: Rolling walker (2 wheels) Transfers: Sit to/from Stand Sit to Stand: Min assist, +2 physical assistance, +2 safety/equipment Stand pivot transfers: Min assist              Ambulation/Gait Ambulation/Gait assistance: Min assist, +2 physical assistance Gait Distance (Feet): 7 Feet Assistive device: Rolling walker (2 wheels) Gait Pattern/deviations: Step-to pattern, Wide base of support, Decreased stride length Gait velocity: decreased Gait velocity interpretation: <1.31 ft/sec, indicative of household ambulator   General Gait Details: taking her time on PMV and with O2 on trach   Stairs             Wheelchair Mobility    Modified Rankin (Stroke Patients Only)       Balance Overall balance assessment: Needs assistance Sitting-balance support: Feet supported Sitting balance-[name]: Fair     Standing balance support: Bilateral upper extremity supported, During functional activity Standing balance-[name]: Fair                              Cognition Arousal/Alertness: Awake/alert Behavior During Therapy: WFL for tasks assessed/performed, Anxious Overall Cognitive Status: Within Functional Limits for tasks assessed  Exercises General Exercises - Lower Extremity Ankle Circles/Pumps: AROM, 5 reps Quad Sets: AROM, 10 reps Gluteal Sets: AROM, 10 reps Long Arc Quad: AROM, 10 reps Heel Slides: AROM, 10 reps    General Comments General comments (skin integrity, edema,  etc.): O2 sats on PMV and standign and transferring were 93% or better      Pertinent Vitals/Pain Pain Assessment Pain Assessment: No/denies pain    Home Living                          Prior Function            PT Goals (current goals can now be found in the care plan section) Acute Rehab PT Goals Patient Stated Goal: get back to work    Frequency    Min 3X/week      PT Plan Current plan remains appropriate    Co-evaluation              AM-PAC PT "6 Clicks" Mobility   Outcome Measure  Help needed turning from your back to your side while in a flat bed without using bedrails?: A Sidener Help needed moving from lying on your back to sitting on the side of a flat bed without using bedrails?: A Lot Help needed moving to and from a bed to a chair (including a wheelchair)?: A Lot Help needed standing up from a chair using your arms (e.g., wheelchair or bedside chair)?: A Lot Help needed to walk in hospital room?: Total Help needed climbing 3-5 steps with a railing? : Total 6 Click Score: 11    End of Session Equipment Utilized During Treatment: Oxygen Activity Tolerance: Treatment limited secondary to medical complications (Comment) Patient left: in chair;with call bell/phone within reach;with chair alarm set;with nursing/sitter in room Nurse Communication: Mobility status PT Visit Diagnosis: Unsteadiness on feet (R26.81);Muscle weakness (generalized) (M62.81);Difficulty in walking, not elsewhere classified (R26.2)     Time: 0312-8118 PT Time Calculation (min) (ACUTE ONLY): 27 min  Charges:  $Gait Training: 8-22 mins $Therapeutic Exercise: 8-22 mins          Ramond Dial 12/25/2021, 5:54 PM  Mee Hives, PT PhD Acute Rehab Dept. Number: [place] and [place]

## 2021-12-26 DIAGNOSIS — I5033 Acute on chronic diastolic (congestive) heart failure: Secondary | ICD-10-CM | POA: Diagnosis not present

## 2021-12-26 NOTE — Plan of Care (Signed)
  Problem: Education: Goal: Ability to demonstrate management of disease process will improve Outcome: Progressing Goal: Ability to verbalize understanding of medication therapies will improve Outcome: Progressing Goal: Individualized Educational Video(s) Outcome: Progressing   Problem: Activity: Goal: Capacity to carry out activities will improve Outcome: Progressing   Problem: Cardiac: Goal: Ability to achieve and maintain adequate cardiopulmonary perfusion will improve Outcome: Progressing   Problem: Education: Goal: Knowledge of General Education information will improve Description: Including pain rating scale, medication(s)/side effects and non-pharmacologic comfort measures Outcome: Progressing   Problem: Health Behavior/Discharge Planning: Goal: Ability to manage health-related needs will improve Outcome: Progressing   Problem: Clinical Measurements: Goal: Ability to maintain clinical measurements within normal limits will improve Outcome: Progressing Goal: Diagnostic test results will improve Outcome: Progressing Goal: Respiratory complications will improve Outcome: Progressing Goal: Cardiovascular complication will be avoided Outcome: Progressing   Problem: Activity: Goal: Risk for activity intolerance will decrease Outcome: Progressing   Problem: Nutrition: Goal: Adequate nutrition will be maintained Outcome: Progressing   Problem: Coping: Goal: Level of anxiety will decrease Outcome: Progressing   Problem: Elimination: Goal: Will not experience complications related to bowel motility Outcome: Progressing Goal: Will not experience complications related to urinary retention Outcome: Progressing   Problem: Safety: Goal: Ability to remain free from injury will improve Outcome: Progressing   Problem: Skin Integrity: Goal: Risk for impaired skin integrity will decrease Outcome: Progressing   Problem: Activity: Goal: Ability to tolerate increased  activity will improve Outcome: Progressing   Problem: Respiratory: Goal: Ability to maintain a clear airway and adequate ventilation will improve Outcome: Progressing   Problem: Role Relationship: Goal: Method of communication will improve Outcome: Progressing

## 2021-12-26 NOTE — TOC Progression Note (Addendum)
Transition of Care [facility]) - Progression Note    Patient Details  Name: Angel Brewer MRN: [id number] Date of Birth: 28-Apr-1966  Transition of Care [facility]) CM/SW [place], RN Phone Number: 12/26/2021, 8:32 AM  Clinical Narrative:    CM re-faxed the patient's clinicals out in the hub for review for numerous SNF facilities for placement.  The patient's trach was placed on 11/29/21 and will need to be 68-days old before disposition but this will allow facilities to review for potential bed offers for placement - pending next week.    I called and spoke with Juliann Pulse, [facility] with [facility] and they are not in-network with the patient's insurance provider.  Maudie Mercury, Admissions Director with [facility]F facilities will review the patient for placement once the trach is 32 days old dated from insertion.  I met with the patient at the bedside and she is agreeable to have clinicals sent outside the area to [facility] in [place], [place].  Clinicals were faxed to [place], Admission CM at [facility] today.  CM and MSW with DTP Team will continue to follow the patient for SNF placement.   Expected Discharge Plan: [place] Barriers to Discharge: No SNF bed, Continued Medical Work up  Expected Discharge Plan and Services Expected Discharge Plan: [place] In-house Referral: PCP / [facility] Discharge Planning Services: CM Consult Post Acute Care Choice: [place] Living arrangements for the past 2 months: [place]                   DME Agency: [employer]                   Social Determinants of Health (SDOH) Interventions Food Insecurity Interventions: Intervention Not Indicated Transportation Interventions: [facility])  Readmission Risk Interventions    12/20/2021   11:33 AM 12/02/2019    1:47 PM  Readmission Risk Prevention Plan  Post Dischage Appt   Complete  Medication Screening  Complete  Transportation Screening Complete Complete  PCP or Specialist Appt within 3-5 Days Complete   HRI or [place] Complete   Social Work Consult for [place] Planning/Counseling Complete   Palliative Care Screening Not Applicable   Medication Review Press photographer) Complete

## 2021-12-26 NOTE — Progress Notes (Signed)
Occupational Therapy Treatment Patient Details Name: Angel Brewer MRN: [id number] DOB: 07-30-1965 Today's Date: 12/26/2021   History of present illness Pt is a 56 y.o. female admitted 11/21/21 with SOB; workup for acute on chronic HF. Transfer to ICU secondary to hypercarbic respiratory failure with increased obtundation. ETT 5/15; s/p trach 5/19; trach collar 5/23. PMH includes HTN, DVT, recurrent PE on Eliquis, morbid obesity, wrist ligament reconstruction.   OT comments  Patient received in bed and eager to participate. Patient was mod of one to get to EOB and required increased time before attempting transfer to avoid dizziness. Patient was able to transfer to recliner from EOB with RW and min assist of one. Patient performed 3 stands from recliner with patient performing reaching tasks to increase balance and to carryover for standing ADLs. Patient continues to make good progress and will continue to be followed by Acute OT.    Recommendations for follow up therapy are one component of a multi-disciplinary discharge planning process, led by the attending physician.  Recommendations may be updated based on patient status, additional functional criteria and insurance authorization.    Follow Up Recommendations  Skilled nursing-short term rehab (<3 hours/day)    Assistance Recommended at Discharge Frequent or constant Supervision/Assistance  Patient can return home with the following  Two people to help with walking and/or transfers;Two people to help with bathing/dressing/bathroom;Assistance with cooking/housework;Direct supervision/assist for medications management;Assist for transportation;Help with stairs or ramp for entrance   Equipment Recommendations  Other (comment) (TBD)    Recommendations for Other Services      Precautions / Restrictions Precautions Precautions: Fall Precaution Comments: trach 5L 28% FiO2, PSMV, bowel incontinence Restrictions Weight Bearing Restrictions:  No       Mobility Bed Mobility Overal bed mobility: Needs Assistance Bed Mobility: Supine to Sit     Supine to sit: Mod assist     General bed mobility comments: only one assist with another providing supervision for as needed purpose    Transfers Overall transfer level: Needs assistance Equipment used: Rolling walker (2 wheels) Transfers: Sit to/from Stand, Bed to chair/wheelchair/BSC Sit to Stand: Min assist     Step pivot transfers: Min assist     General transfer comment: only one assist with nursing tech in room     Balance Overall balance assessment: Needs assistance Sitting-balance support: Feet supported Sitting balance-[name]: Fair Sitting balance - Comments: sat on EOB with supervision due to air mattress   Standing balance support: Single extremity supported, No upper extremity supported, Bilateral upper extremity supported Standing balance-[name]: Fair Standing balance comment: performed reaching tasks while standing and able to static stand without UE support                           ADL either performed or assessed with clinical judgement   ADL Overall ADL's : Needs assistance/impaired     Grooming: Wash/dry hands;Wash/dry face;Set up;Sitting Grooming Details (indicate cue type and reason): in recliner     Lower Body Bathing: Maximal assistance;Sit to/from stand Lower Body Bathing Details (indicate cue type and reason): stood to clean peri area                       General ADL Comments: continues to have loose stools when standing    Extremity/Trunk Assessment Upper Extremity Assessment RUE Sensation: WNL LUE Sensation: WNL            Vision  Perception     Praxis      Cognition Arousal/Alertness: Awake/alert Behavior During Therapy: WFL for tasks assessed/performed, Anxious Overall Cognitive Status: Within Functional Limits for tasks assessed                                           Exercises Exercises: General Upper Extremity General Exercises - Upper Extremity Shoulder Flexion: AROM, Both, Standing Shoulder ABduction: AROM, Both, 10 reps, Standing    Shoulder Instructions       General Comments      Pertinent Vitals/ Pain       Pain Assessment Pain Assessment: Faces Faces Pain Scale: Hurts Gavina more Pain Location: abdomen Pain Descriptors / Indicators: Discomfort, Grimacing, Guarding Pain Intervention(s): Monitored during session  Home Living                                          Prior Functioning/Environment              Frequency  Min 2X/week        Progress Toward Goals  OT Goals(current goals can now be found in the care plan section)  Progress towards OT goals: Progressing toward goals  Acute Rehab OT Goals Patient Stated Goal: get better OT Goal Formulation: With patient Time For Goal Achievement: 12/30/21 Potential to Achieve Goals: Good ADL Goals Pt Will Perform Grooming: with set-up;standing Pt Will Perform Upper Body Bathing: with min guard assist;with adaptive equipment;sitting Pt Will Perform Lower Body Bathing: with mod assist;sitting/lateral leans;with adaptive equipment Pt Will Transfer to Toilet: with max assist;with +2 assist;bedside commode Pt Will Perform Toileting - Clothing Manipulation and hygiene: with max assist;with 2+ total assist;sit to/from stand Pt/caregiver will Perform Home Exercise Program: Increased strength;Both right and left upper extremity;With theraband;Independently;With written HEP provided Additional ADL Goal #1: Pt will perform bed mobility to come EOB as precursor to participation in ADL at [place] Discharge plan remains appropriate;Frequency remains appropriate    Co-evaluation                 AM-PAC OT "6 Clicks" Daily Activity     Outcome Measure   Help from another person eating meals?: A [place] Help from another person taking care of  personal grooming?: A Olaes Help from another person toileting, which includes using toliet, bedpan, or urinal?: A Lot Help from another person bathing (including washing, rinsing, drying)?: A Lot Help from another person to put on and taking off regular upper body clothing?: A Lot Help from another person to put on and taking off regular lower body clothing?: Total 6 Click Score: 13    End of Session Equipment Utilized During Treatment: Rolling walker (2 wheels);Oxygen (8 L 40% FiO2 via trach Collar)  OT Visit Diagnosis: Other abnormalities of gait and mobility (R26.89);Unsteadiness on feet (R26.81);Muscle weakness (generalized) (M62.81);Other (comment)   Activity Tolerance Patient tolerated treatment well   Patient Left in chair;with call bell/phone within reach   Nurse Communication Mobility status        Time: 0865-7846 OT Time Calculation (min): 28 min  Charges: OT General Charges $OT Visit: 1 Visit OT Treatments $Self Care/Home Management : 8-22 mins $Therapeutic Activity: 8-22 mins  Lodema Hong, [place]  Pager [phone] Office [phone]  Kiaya [place] Alexis Goodell 12/26/2021, 11:14 AM

## 2021-12-26 NOTE — Progress Notes (Signed)
Heart Failure Stewardship Pharmacist Progress Note   PCP: [name], NP PCP-Cardiologist: None    HPI:  56 yo female with PMH of OSA on CPAP, diastolic CHF, HLD, and DVT 2/2 pregnancy 2017, and pulmonary HTN.  Presented to ED with progressive dyspnea at rest and on exertion, orthopnea with LEE on exam.  CXR with moderate interstitial lung markings indicative of moderate/severe CHF and pulmonary edema.  Echo 05/12 with LVEF 60-65%, mild LVH, normal RV - unchanged since 04/2021 Echo.  Adherent to PTA furosemide.  Started on IV diuretics.  Became less responsive and developed respiratory acidosis despite being on bipap and was then intubated on 5/13.  Ongoing discussions for extubation vs trach - failed SBT 05/16, 05/17, 05/18. Trach placed 5/19.   Patient reported dysuria and vaginal itching 05/24 since foley removal and was started on Ceftriaxone x5d for empiric UTI and fluconazole x2 doses for possible yeast infection; afebrile, WBC wnl.    Repeat CXR on 05/25 showed moderate interstitial pulmonary edema (L > R alveolar opacity), largely unchanged from previous imaging.  Weaned off tube feeds 05/30 and is somewhat tolerating PO diet so far.  Pending LTACH bed.  Current HF Medications: Diuretic: furosemide 40 mg daily Aldosterone Antagonist: spironolactone 25 mg daily SGLT2i: Farxiga 10 mg daily  Prior to admission HF Medications: Diuretic: furosemide 40 mg daily  Pertinent Lab Values: As of 6/10: Serum creatinine 0.92 (baseline <1), BUN 21, Potassium 4.1, Sodium 133, BNP 139.2, Magnesium 2.0  Vital Signs: Weight: 413 lbs (admission weight: 433.4 lbs)  05/07/2021 discharge weight: 401 lb Blood pressure: 110/80s Heart rate: 80s  Medication Assistance / Insurance Benefits Check: Does the patient have prescription insurance?  Yes Type of insurance plan: Managed Medicaid - Entresto/SGLT2i require PA  Outpatient Pharmacy:  [facility] Is the  patient willing to use [facility] at discharge? Pending Is the patient willing to transition their outpatient pharmacy to utilize a [facility] outpatient pharmacy?   Pending    Assessment: 1. Acute on chronic diastolic CHF (LVEF 48-18%). NYHA class II symptoms. - Continue furosemide 40 mg PO daily - Continue spironolactone 25 mg daily - Continue Farxiga 10 mg daily - no h/o UTIs in the past.  Reported dysuria symptoms likely due to foley removal as she had no other symptoms, fever and WBC was wnl.  - No BB - Off amlodipine - Further GDMT optimization previously limited with trach/cortrak status    Plan: 1) Medication changes recommended at this time: - Continue current regimen  2) Patient assistance: - Farxiga PA approved  3)  Education  - Patient has been educated on current HF medications and potential additions to HF medication regimen - Patient verbalizes understanding that over the next few months, these medication doses may change and more medications may be added to optimize HF regimen - Patient has been educated on basic disease state pathophysiology and goals of therapy  Kerby Nora, PharmD, BCPS Heart Failure Stewardship Pharmacist Phone [phone]  Please check [email or web address] for unit-specific pharmacy phone numbers.

## 2021-12-26 NOTE — Progress Notes (Signed)
Progress Note  Patient: Angel Brewer OHC:[id number] DOB: July 30, 1965  DOA: 11/21/2021  DOS: 12/26/2021    Brief hospital course: Mrs. Stanke was admitted to the hospital with the working diagnosis of acute on chronic diastolic heart failure, complicated with acute hypoxemic respiratory failure.   Disa Edrington is a 56 y.o. female with medical history significant for HFpEF (last EF 60-65% October 2022), history of recurrent PE on Eliquis, pulmonary hypertension, HTN, HLD, OSA/OHS on CPAP, obesity class 3. Reported 2 days of worsening dyspnea, orthopnea and cough. On her initial physical examination her blood pressure was 144/83, HR 78, RR 34, 02 saturation 94% on 4 L/min per [place]. Distant breath sounds, with no wheezing, heart with S1 and S2 present and rhythmic, abdomen not distended, positive lower extremity edema.   Chest radiograph with cardiomegaly, bilateral interstitial infiltrates, congested hilar vasculature and cephalization of the vasculature.   5/11 initial admission to hospitalist service.  Started on BiPAP and diuresed successfully for 5 L. 5/13 increasing somnolence and hypercarbia new since admission. 5/15 am intubated. Discussed trach with son who wanted to give her a chance of extubation before proceeding straight to trach 5/16 failed SBT for tachypnea 5/17 failed SBT for tachypnea 5/18 failed SBT for rapid and shallow breathing despite high PS 5/19 tracheostomy placed 5/24 Placed on trach collar mid morning of 5/23 and has remained on ATC since  5/25 stable on trach collar 5/27 Transferred from PCCM to [facility] service  5/28 - Remains stable on tracheostomy managed by pulmonology awaiting transfer to facility (who will not take new trach - much mature at least 30 days per documentation). 6/12-Present - increasing activity levels - able to ambulate OOB with assistance; making good progress with PT; continues to wean oxygen daily as tolerated  Patient remains medically stable to be  transfer to rehab. She still has cuffed trach that probably need to be changed to uncuffed before discharge. Follow up with pulmonary for further recommendations.   Assessment and Plan:  Acute hypoxemic and hypercapnic respiratory failure due to acute cardiogenic pulmonary edema due to acute on chronic HFpEF and OHS: - s/p tracheostomy 5/19 > ATC 5/24 on 10L/40% FiO2, currently with #6 Shiley uncuffed, using PMV. Management per PCCM weekly and prn. Not likely to decannulate given her use of trilogy at home.  Acute on chronic HFpEF, HTN: Echocardiogram with preserved LV systolic function 60 to 82%, RV systolic function is preserved, no significant valvular disease.  - Restarted home lasix 69m daily. Appears roughly euvolemic s/p significant diuresis earlier in hospitalization. - Continue spironolactone and dapagliflozin.  - Labs stable.  OHS, OSA:  - Weight loss recommended, encouraged to mobilize as much as possible to avoid hypoventilation/atelectasis.   Morbid obesity: Body mass index is 64.81 kg/m.  - Given comorbidities that could be impacted by weight loss, would recommend bariatric surgery evaluation if patient amenable.   History of recurrent PE:  - Continue eliquis.  Anxiety:  - Continue clonazepam low dose scheduled  HLD:   - Continue atorvastatin   Hyponatremia: Mild, stable.   Acute metabolic encephalopathy: Resolved.  Leukocytosis: Resolved without targeted treatment.  Subjective: No acute issues or events overnight; remains in good spirits as she continues to improve  Objective: Vitals:   12/25/21 1958 12/25/21 2300 12/26/21 0300 12/26/21 0735  BP: 109/69  109/69   Pulse: 79 82 83 61  Resp: _0 Temp: 98.4 F (36.9 C)     TempSrc: Oral     SpO2: 96%  98% 99% 97%  Weight:      Height:       Gen: 56 y.o. female in no distress Pulm: Nonlabored breathing thru trach with PMV. Clear. CV: Regular rate and rhythm. No murmur, rub, or gallop. No JVD, no  dependent edema. GI: Abdomen soft, non-tender, non-distended, with normoactive bowel sounds.  Ext: Warm, no deformities Skin: No rashes, lesions or ulcers on visualized skin. Neuro: Alert and oriented. No focal neurological deficits. Psych: Judgement and insight appear fair. Mood euthymic & affect congruent.      Data Personally reviewed: CBC: Recent Labs  Lab 12/21/21 0913  WBC 4.3  HGB 11.0*  HCT 36.6  MCV 92.9  PLT 518    Basic Metabolic Panel: Recent Labs  Lab 12/21/21 0913  NA 138  K 4.1  CL 93*  CO2 33*  GLUCOSE 86  BUN 21*  CREATININE 0.92  CALCIUM 9.7  MG 2.0    Family Communication: None at bedside  Disposition: Status is: Inpatient Remains inpatient appropriate because: Unsafe DC; medically stable Planned Discharge Destination: Pending SNF placement, likely not to occur prior to 30 days from trach placement on 5/19   Cartier Ishikawa, MD 12/26/2021 8:12 AM Page by [email or web address]

## 2021-12-27 DIAGNOSIS — I5033 Acute on chronic diastolic (congestive) heart failure: Secondary | ICD-10-CM | POA: Diagnosis not present

## 2021-12-27 NOTE — Progress Notes (Signed)
Physical Therapy Treatment Patient Details Name: Angel Brewer MRN: [id number] DOB: October 02, 1965 Today's Date: 12/27/2021   History of Present Illness Pt is a 56 y.o. female admitted 11/21/21 with SOB; workup for acute on chronic HF. Transfer to ICU secondary to hypercarbic respiratory failure with increased obtundation. ETT 5/15; s/p trach 5/19; trach collar 5/23. PMH includes HTN, DVT, recurrent PE on Eliquis, morbid obesity, wrist ligament reconstruction.    PT Comments    Pt received in supine, pleasantly agreeable to therapy session and with good participation in bed mobility and transfer training to get up to bari [facility]. Pt limited due to bowel urgency and loose stools, but is agreeable to further mobility progression after toileting (needs more time). NT notified to let PTA know when pt is done, room set up to progress gait with trach venturi portable mask obtained for O2 tank. Pt continues to benefit from PT services to progress toward functional mobility goals.    Recommendations for follow up therapy are one component of a multi-disciplinary discharge planning process, led by the attending physician.  Recommendations may be updated based on patient status, additional functional criteria and insurance authorization.  Follow Up Recommendations  Skilled nursing-short term rehab (<3 hours/day)     Assistance Recommended at Discharge Frequent or constant Supervision/Assistance  Patient can return home with the following Two people to help with walking and/or transfers;Two people to help with bathing/dressing/bathroom;Help with stairs or ramp for entrance;Assist for transportation;Direct supervision/assist for financial management;Assistance with Education officer, environmental (measurements PT);Wheelchair cushion (measurements PT);Hospital bed;Rolling walker (2 wheels) (bariatric size)    Recommendations for Other Services       Precautions / Restrictions  Precautions Precautions: Fall Precaution Comments: trach 5L 28% FiO2, PSMV, bowel incontinence Restrictions Weight Bearing Restrictions: No     Mobility  Bed Mobility Overal bed mobility: Needs Assistance Bed Mobility: Supine to Sit     Supine to sit: Mod assist     General bed mobility comments: only one assist with another providing supervision for as needed purpose, HOB elevated; on kreg air bed    Transfers Overall transfer level: Needs assistance Equipment used: Rolling walker (2 wheels) Transfers: Sit to/from Stand, Bed to chair/wheelchair/BSC Sit to Stand: Min assist, Min guard   Step pivot transfers: Min assist, +2 safety/equipment, From elevated surface       General transfer comment: only one assist with nursing tech in room for safety; from EOB>bari [facility]           Balance Overall balance assessment: Needs assistance Sitting-balance support: Feet supported Sitting balance-[name]: Fair Sitting balance - Comments: sat on EOB with supervision due to air mattress, slight lean toward HOB on R   Standing balance support: Single extremity supported, Bilateral upper extremity supported Standing balance-[name]: Fair Standing balance comment: no LOB standing at RW, able to static stand with single HHA while transitioning surfaces                            Cognition Arousal/Alertness: Awake/alert Behavior During Therapy: WFL for tasks assessed/performed Overall Cognitive Status: Within Functional Limits for tasks assessed              General Comments: eager to participate in therapy, less anxious today but still needs increased time to initiate due to anxiety        Exercises      General Comments  Pertinent Vitals/Pain Pain Assessment Pain Assessment: Faces Faces Pain Scale: Hurts Eisenhower more Pain Location: abdomen Pain Descriptors / Indicators: Discomfort, Grimacing, Guarding Pain Intervention(s): Monitored during  session, Repositioned (improved comfort after toileting)           PT Goals (current goals can now be found in the care plan section) Acute Rehab PT Goals Patient Stated Goal: get back to work PT Goal Formulation: With patient Time For Goal Achievement: 12/30/21 Progress towards PT goals: Progressing toward goals    Frequency    Min 3X/week      PT Plan Current plan remains appropriate       AM-PAC PT "6 Clicks" Mobility   Outcome Measure  Help needed turning from your back to your side while in a flat bed without using bedrails?: A [place] Help needed moving from lying on your back to sitting on the side of a flat bed without using bedrails?: A Lot Help needed moving to and from a bed to a chair (including a wheelchair)?: A Bolen Help needed standing up from a chair using your arms (e.g., wheelchair or bedside chair)?: A Xue Help needed to walk in hospital room?: A [place] Help needed climbing 3-5 steps with a railing? : Total 6 Click Score: 15    End of Session Equipment Utilized During Treatment: Oxygen;Gait belt (bed sheet twisted around to form gait belt) Activity Tolerance: Patient tolerated treatment well;Other (comment) (loose stools/bowel urgency limiting gait distance, plan to return later) Patient left: in chair;with call bell/phone within reach;Other (comment) (up on [facility]) Nurse Communication: Mobility status;Other (comment) (pt to call once done on toilet) PT Visit Diagnosis: Unsteadiness on feet (R26.81);Muscle weakness (generalized) (M62.81);Difficulty in walking, not elsewhere classified (R26.2) Pain - Right/Left: Right Pain - part of body: Knee (abdomen)     Time: 4970-2637 PT Time Calculation (min) (ACUTE ONLY): 21 min  Charges:  $Therapeutic Activity: 8-22 mins                     Natalio Salois P., PTA Acute Rehabilitation Services Secure Chat Preferred 9a-5:30pm Office: [place] 12/27/2021, 12:13 PM

## 2021-12-27 NOTE — Progress Notes (Addendum)
Heart Failure Stewardship Pharmacist Progress Note   PCP: [name], NP PCP-Cardiologist: None    HPI:  56 yo female with PMH of OSA on CPAP, diastolic CHF, HLD, and DVT 2/2 pregnancy 2017, and pulmonary HTN.  Presented to ED with progressive dyspnea at rest and on exertion, orthopnea with LEE on exam.  CXR with moderate interstitial lung markings indicative of moderate/severe CHF and pulmonary edema.  Echo 05/12 with LVEF 60-65%, mild LVH, normal RV - unchanged since 04/2021 Echo.  Adherent to PTA furosemide.  Started on IV diuretics.  Became less responsive and developed respiratory acidosis despite being on bipap and was then intubated on 5/13.  Ongoing discussions for extubation vs trach - failed SBT 05/16, 05/17, 05/18. Trach placed 5/19.   Patient reported dysuria and vaginal itching 05/24 since foley removal and was started on Ceftriaxone x5d for empiric UTI and fluconazole x2 doses for possible yeast infection; afebrile, WBC wnl.    Repeat CXR on 05/25 showed moderate interstitial pulmonary edema (L > R alveolar opacity), largely unchanged from previous imaging.  Weaned off tube feeds 05/30 and is somewhat tolerating PO diet so far.  Pending LTACH bed -DTP team consulted.  Current HF Medications: Diuretic: furosemide 40 mg daily Aldosterone Antagonist: spironolactone 25 mg daily SGLT2i: Farxiga 10 mg daily  Prior to admission HF Medications: Diuretic: furosemide 40 mg daily  Pertinent Lab Values: As of 6/10: Serum creatinine 0.92 (baseline <1), BUN 21, Potassium 4.1, Sodium 133, BNP 139.2, Magnesium 2.0  Vital Signs: Weight: 413 lbs (admission weight: 433.4 lbs)  05/07/2021 discharge weight: 401 lb Blood pressure: 110/60s Heart rate: 60s  Medication Assistance / Insurance Benefits Check: Does the patient have prescription insurance?  Yes Type of insurance plan: Managed Medicaid - Entresto/SGLT2i require PA  Outpatient Pharmacy:  [facility] Is the patient willing to use [place] at discharge? Pending Is the patient willing to transition their outpatient pharmacy to utilize a [facility] outpatient pharmacy?   Pending    Assessment: 1. Acute on chronic diastolic CHF (LVEF 71-25%). NYHA class II symptoms. - Continue furosemide 40 mg PO daily - Continue spironolactone 25 mg daily - Continue Farxiga 10 mg daily - no h/o UTIs in the past.  Reported dysuria symptoms likely due to foley removal as she had no other symptoms, fever and WBC was wnl.  - No BB - Off amlodipine - Further GDMT optimization previously limited with trach/cortrak status    Plan: 1) Medication changes recommended at this time: - Continue current regimen - will sign off with difficult placing  2) Patient assistance: - Farxiga PA approved  3)  Education  - Patient has been educated on current HF medications and potential additions to HF medication regimen - Patient verbalizes understanding that over the next few months, these medication doses may change and more medications may be added to optimize HF regimen - Patient has been educated on basic disease state pathophysiology and goals of therapy  Kerby Nora, PharmD, BCPS Heart Failure Stewardship Pharmacist Phone [phone]  Please check [email or web address] for unit-specific pharmacy phone numbers.

## 2021-12-27 NOTE — Progress Notes (Addendum)
Physical Therapy Treatment Patient Details Name: Angel Brewer MRN: [id number] DOB: 1966/03/01 Today's Date: 12/27/2021   History of Present Illness Pt is a 56 y.o. female admitted 11/21/21 with SOB; workup for acute on chronic HF. Transfer to ICU secondary to hypercarbic respiratory failure with increased obtundation. ETT 5/15; s/p trach 5/19; trach collar 5/23. PMH includes HTN, DVT, recurrent PE on Eliquis, morbid obesity, wrist ligament reconstruction.    PT Comments    Pt received up on BSC, agreeable to further mobility progression using RW with good effort and participation. Pt needing +2 for safety with peri-care (RN in room to assist) and able to progress to household distance gait trial with min guard to minA and cues for activity pacing/safety, SpO2 WFL on 28% FiO2, HR to 137 bpm. Pt with improved activity tolerance today and able to perform multiple standing exercises and transfers after gait trial. Pt continues to benefit from PT services to progress toward functional mobility goals.    Recommendations for follow up therapy are one component of a multi-disciplinary discharge planning process, led by the attending physician.  Recommendations may be updated based on patient status, additional functional criteria and insurance authorization.  Follow Up Recommendations  Skilled nursing-short term rehab (<3 hours/day)     Assistance Recommended at Discharge Frequent or constant Supervision/Assistance  Patient can return home with the following Help with stairs or ramp for entrance;Assist for transportation;Direct supervision/assist for financial management;Assistance with cooking/housework;A Bryner help with walking and/or transfers;A lot of help with bathing/dressing/bathroom   Equipment Recommendations  Wheelchair (measurements PT);Wheelchair cushion (measurements PT);Hospital bed;Rolling walker (2 wheels) (bariatric size)    Recommendations for Other Services       Precautions /  Restrictions Precautions Precautions: Fall Precaution Comments: trach 5L 28% FiO2, PSMV, bowel incontinence Restrictions Weight Bearing Restrictions: No     Mobility  Bed Mobility Overal bed mobility: Needs Assistance Bed Mobility: Supine to Sit     Supine to sit: Mod assist     General bed mobility comments: only one assist with another providing supervision for as needed purpose, HOB elevated; on kreg air bed    Transfers Overall transfer level: Needs assistance Equipment used: Rolling walker (2 wheels) Transfers: Sit to/from Stand, Bed to chair/wheelchair/BSC Sit to Stand: Min assist, Min guard   Step pivot transfers: Min assist, +2 safety/equipment, From elevated surface       General transfer comment: only one assist with nursing tech in room for safety; from bari BSC>bari recliner (pillows placed under to elevate hips/for comfort), then x5 reps from recliner with min guard using arm rests    Ambulation/Gait Ambulation/Gait assistance: Min assist, +2 safety/equipment Gait Distance (Feet): 40 Feet Assistive device: Rolling walker (2 wheels) (bariatric) Gait Pattern/deviations: Wide base of support, Decreased stride length Gait velocity: decreased     General Gait Details: SpO2 WFL on 28% FiO2 via trach venturi mask, HR 119-137 bpm during gait      Balance Overall balance assessment: Needs assistance Sitting-balance support: Feet supported Sitting balance-[name]: Fair Sitting balance - Comments: sat on EOB with supervision due to air mattress, slight lean toward HOB on R   Standing balance support: Single extremity supported, Bilateral upper extremity supported Standing balance-[name]: Fair Standing balance comment: no LOB standing at RW, able to static stand with single HHA while transitioning surfaces and during standing marching exercise  Cognition Arousal/Alertness: Awake/alert Behavior During Therapy: WFL  for tasks assessed/performed Overall Cognitive Status: Within Functional Limits for tasks assessed                                 General Comments: eager to participate in therapy, less anxious today but still needs increased time to initiate due to anxiety        Exercises Other Exercises Other Exercises: standing BLE AROM: hip flexion (single UE support, all other exercises pt needed BUE support), hamstring curls, heel raises x10 reps ea Other Exercises: serial STS x 5 reps using arm rests    General Comments General comments (skin integrity, edema, etc.): SpO2 92-95% on 28% FiO2 (briefly desat to 88% when O2 trach mask slid to side of trach, so still needs 28%); HR resting 82 bpm and up to 137 HR max with exertion      Pertinent Vitals/Pain Pain Assessment Pain Assessment: Faces Faces Pain Scale: Hurts a Climer bit Pain Location: B knees with gait progression Pain Descriptors / Indicators: Discomfort, Sore Pain Intervention(s): Monitored during session, Repositioned           PT Goals (current goals can now be found in the care plan section) Acute Rehab PT Goals Patient Stated Goal: get back to work PT Goal Formulation: With patient Time For Goal Achievement: 12/30/21 Progress towards PT goals: Progressing toward goals    Frequency    Min 3X/week      PT Plan Current plan remains appropriate       AM-PAC PT "6 Clicks" Mobility   Outcome Measure  Help needed turning from your back to your side while in a flat bed without using bedrails?: A Moehring Help needed moving from lying on your back to sitting on the side of a flat bed without using bedrails?: A Lot Help needed moving to and from a bed to a chair (including a wheelchair)?: A Qadir Help needed standing up from a chair using your arms (e.g., wheelchair or bedside chair)?: A Corrow Help needed to walk in hospital room?: A Buboltz Help needed climbing 3-5 steps with a railing? : Total 6 Click  Score: 15    End of Session Equipment Utilized During Treatment: Oxygen;Gait belt (bed sheet twisted around to form gait belt) Activity Tolerance: Patient tolerated treatment well Patient left: in chair;with call bell/phone within reach (pt does not want legs reclined, defers chair alarm) Nurse Communication: Mobility status PT Visit Diagnosis: Unsteadiness on feet (R26.81);Muscle weakness (generalized) (M62.81);Difficulty in walking, not elsewhere classified (R26.2) Pain - Right/Left: Right Pain - part of body: Knee     Time: 6226-3335 PT Time Calculation (min) (ACUTE ONLY): 24 min  Charges:  $Gait Training: 8-22 mins $Therapeutic Exercise: 8-22 mins                     Angeliah Wisdom P., PTA Acute Rehabilitation Services Secure Chat Preferred 9a-5:30pm Office: [place] 12/27/2021, 12:43 PM

## 2021-12-27 NOTE — Progress Notes (Signed)
Progress Note  Patient: Angel Brewer [id number] DOB: 1966/02/20  DOA: 11/21/2021  DOS: 12/27/2021    Brief hospital course: Angel Brewer was admitted to the hospital with the working diagnosis of acute on chronic diastolic heart failure, complicated with acute hypoxemic respiratory failure.   Angel Brewer is a 56 y.o. female with medical history significant for HFpEF (last EF 60-65% October 2022), history of recurrent PE on Eliquis, pulmonary hypertension, HTN, HLD, OSA/OHS on CPAP, obesity class 3. Reported 2 days of worsening dyspnea, orthopnea and cough. On her initial physical examination her blood pressure was 144/83, HR 78, RR 34, 02 saturation 94% on 4 L/min per Angel Brewer. Distant breath sounds, with no wheezing, heart with S1 and S2 present and rhythmic, abdomen not distended, positive lower extremity edema.   Chest radiograph with cardiomegaly, bilateral interstitial infiltrates, congested hilar vasculature and cephalization of the vasculature.   5/11 initial admission to hospitalist service.  Started on BiPAP and diuresed successfully for 5 L. 5/13 increasing somnolence and hypercarbia new since admission. 5/15 am intubated. Discussed trach with son who wanted to give her a chance of extubation before proceeding straight to trach 5/16 failed SBT for tachypnea 5/17 failed SBT for tachypnea 5/18 failed SBT for rapid and shallow breathing despite high PS 5/19 tracheostomy placed 5/24 Placed on trach collar mid morning of 5/23 and has remained on ATC since  5/25 stable on trach collar 5/27 Transferred from PCCM to [facility] service  5/28 - Remains stable on tracheostomy managed by pulmonology awaiting transfer to facility (who will not take new trach - much mature at least 30 days per documentation). 6/12-Present - increasing activity levels - able to ambulate OOB with assistance; making good progress with PT; continues to wean oxygen daily as tolerated 6/16 -patient able to ambulate 40 feet  today with minimal assist per PT  Patient remains medically stable to be transfer to rehab. She still has cuffed trach that probably need to be changed to uncuffed before discharge. Follow up with pulmonary for further recommendations.   Assessment and Plan:  Acute hypoxemic and hypercapnic respiratory failure due to acute cardiogenic pulmonary edema due to acute on chronic HFpEF and OHS: - s/p tracheostomy 5/19 > ATC 5/24 on 10L/40% FiO2, currently with #6 Shiley uncuffed, using PMV. Management per PCCM weekly and prn. Not likely to decannulate given her use of trilogy at home.  Acute on chronic HFpEF, HTN: Echocardiogram with preserved LV systolic function 60 to 94%, RV systolic function is preserved, no significant valvular disease.  - Restarted home lasix 50m daily. Appears roughly euvolemic s/p significant diuresis earlier in hospitalization. - Continue spironolactone and dapagliflozin.  - Labs stable.  OHS, OSA:  - Weight loss recommended, encouraged to mobilize as much as possible to avoid hypoventilation/atelectasis.   Morbid obesity: Body mass index is 64.81 kg/m.  - Given comorbidities that could be impacted by weight loss, would recommend bariatric surgery evaluation if patient amenable.   History of recurrent PE:  - Continue eliquis.  Anxiety:  - Continue clonazepam low dose scheduled  HLD:   - Continue atorvastatin   Hyponatremia: Mild, stable.   Acute metabolic encephalopathy: Resolved.  Leukocytosis: Resolved without targeted treatment.  Subjective: No acute issues or events overnight; remains in good spirits as she continues to improve  Objective: Vitals:   12/26/21 1926 12/26/21 1942 12/26/21 2351 12/27/21 0343  BP: 121/74     Pulse: 72 70 66 (!) 56  Resp:  _0 Temp: 97.7 F (  36.5 C)     TempSrc: Oral     SpO2: 96% 93% 96% 94%  Weight:      Height:       Gen: 56 y.o. female in no distress Pulm: Nonlabored breathing thru trach with PMV.  Clear. CV: Regular rate and rhythm. No murmur, rub, or gallop. No JVD, no dependent edema. GI: Abdomen soft, non-tender, non-distended, with normoactive bowel sounds.  Ext: Warm, no deformities Skin: No rashes, lesions or ulcers on visualized skin. Neuro: Alert and oriented. No focal neurological deficits. Psych: Judgement and insight appear fair. Mood euthymic & affect congruent.      Data Personally reviewed: CBC: Recent Labs  Lab 12/21/21 0913  WBC 4.3  HGB 11.0*  HCT 36.6  MCV 92.9  PLT 833    Basic Metabolic Panel: Recent Labs  Lab 12/21/21 0913  NA 138  K 4.1  CL 93*  CO2 33*  GLUCOSE 86  BUN 21*  CREATININE 0.92  CALCIUM 9.7  MG 2.0    Family Communication: None at bedside  Disposition: Status is: Inpatient Remains inpatient appropriate because: Unsafe DC; medically stable Planned Discharge Destination: Pending SNF placement, likely not to occur prior to 30 days from trach placement on 5/19   Leachman Ishikawa, MD 12/27/2021 7:19 AM Page by [email or web address]

## 2021-12-27 NOTE — Progress Notes (Signed)
Patient seen today by trach team for consult.  No education is needed at this time.  All necessary equipment is at beside.   Will continue to follow for progression.

## 2021-12-28 DIAGNOSIS — I5033 Acute on chronic diastolic (congestive) heart failure: Secondary | ICD-10-CM | POA: Diagnosis not present

## 2021-12-28 NOTE — Progress Notes (Signed)
Progress Note  Patient: Angel Brewer [id number]:[id number] DOB: 12/29/1965  DOA: 11/21/2021  DOS: 12/28/2021    Brief hospital course: Angel Brewer was admitted to the hospital with the working diagnosis of acute on chronic diastolic heart failure, complicated with acute hypoxemic respiratory failure.   Angel Brewer is a 56 y.o. female with medical history significant for HFpEF (last EF 60-65% October 2022), history of recurrent PE on Eliquis, pulmonary hypertension, HTN, HLD, OSA/OHS on CPAP, obesity class 3. Reported 2 days of worsening dyspnea, orthopnea and cough. On her initial physical examination her blood pressure was 144/83, HR 78, RR 34, 02 saturation 94% on 4 L/min per [place]. Distant breath sounds, with no wheezing, heart with S1 and S2 present and rhythmic, abdomen not distended, positive lower extremity edema.   Chest radiograph with cardiomegaly, bilateral interstitial infiltrates, congested hilar vasculature and cephalization of the vasculature.   5/11 initial admission to hospitalist service.  Started on BiPAP and diuresed successfully for 5 L. 5/13 increasing somnolence and hypercarbia new since admission. 5/15 am intubated. Discussed trach with son who wanted to give her a chance of extubation before proceeding straight to trach 5/16 failed SBT for tachypnea 5/17 failed SBT for tachypnea 5/18 failed SBT for rapid and shallow breathing despite high PS 5/19 tracheostomy placed 5/24 Placed on trach collar mid morning of 5/23 and has remained on ATC since  5/25 stable on trach collar 5/27 Transferred from PCCM to [facility] service  5/28 - Remains stable on tracheostomy managed by pulmonology awaiting transfer to facility (who will not take new trach - much mature at least 30 days per documentation). 6/12-Present - increasing activity levels - able to ambulate OOB with assistance; making good progress with PT; continues to wean oxygen daily as tolerated 6/16 -patient continues to progress with  increasing physical activity  Patient remains medically stable to be transfer to rehab. She still has cuffed trach that probably need to be changed to uncuffed before discharge. Follow up with pulmonary for further recommendations.   Assessment and Plan:  Acute hypoxemic and hypercapnic respiratory failure due to acute cardiogenic pulmonary edema due to acute on chronic HFpEF and OHS: - s/p tracheostomy 5/19 > ATC 5/24 on 10L/40% FiO2, currently with #6 Shiley uncuffed, using PMV. Management per PCCM weekly and prn. Not likely to decannulate given her use of trilogy at home.  Acute on chronic HFpEF, HTN: Echocardiogram with preserved LV systolic function 60 to 88%, RV systolic function is preserved, no significant valvular disease.  - Restarted home lasix 45m daily. Appears roughly euvolemic s/p significant diuresis earlier in hospitalization. - Continue spironolactone and dapagliflozin.  - Labs stable.  OHS, OSA:  - Weight loss recommended, encouraged to mobilize as much as possible to avoid hypoventilation/atelectasis.   Morbid obesity: Body mass index is 64.81 kg/m.  - Given comorbidities that could be impacted by weight loss, would recommend bariatric surgery evaluation if patient amenable.   History of recurrent PE:  - Continue eliquis.  Anxiety:  - Continue clonazepam low dose scheduled  HLD:   - Continue atorvastatin   Hyponatremia: Mild, stable.   Acute metabolic encephalopathy: Resolved.  Leukocytosis: Resolved without targeted treatment.  Subjective: No acute issues or events overnight; remains in good spirits as she continues to improve  Objective: Vitals:   12/27/21 2119 12/27/21 2313 12/28/21 0413 12/28/21 0745  BP: (!) 93/55   103/67  Pulse: 79 67 67 (!) 54  Resp: _0 Temp: 98 F (36.7 C)  98.1 F (36.7 C)  TempSrc: Oral   Oral  SpO2: 93% 97% 93% 94%  Weight:      Height:       Gen: 56 y.o. female in no distress Pulm: Nonlabored breathing  thru trach with PMV. Clear. CV: Regular rate and rhythm. No murmur, rub, or gallop. No JVD, no dependent edema. GI: Abdomen soft, non-tender, non-distended, with normoactive bowel sounds.  Ext: Warm, no deformities Skin: No rashes, lesions or ulcers on visualized skin. Neuro: Alert and oriented. No focal neurological deficits. Psych: Judgement and insight appear fair. Mood euthymic & affect congruent.      Data Personally reviewed: CBC: Recent Labs  Lab 12/21/21 0913  WBC 4.3  HGB 11.0*  HCT 36.6  MCV 92.9  PLT 924    Basic Metabolic Panel: Recent Labs  Lab 12/21/21 0913  NA 138  K 4.1  CL 93*  CO2 33*  GLUCOSE 86  BUN 21*  CREATININE 0.92  CALCIUM 9.7  MG 2.0    Family Communication: None at bedside  Disposition: Status is: Inpatient Remains inpatient appropriate because: Unsafe DC; medically stable Planned Discharge Destination: Pending SNF placement, likely not to occur prior to 30 days from trach placement on 5/19   Corle Ishikawa, MD 12/28/2021 7:53 AM Page by [email or web address]

## 2021-12-28 NOTE — Plan of Care (Signed)
  Problem: Education: Goal: Ability to demonstrate management of disease process will improve Outcome: Progressing Goal: Ability to verbalize understanding of medication therapies will improve Outcome: Progressing Goal: Individualized Educational Video(s) Outcome: Progressing   Problem: Activity: Goal: Capacity to carry out activities will improve Outcome: Progressing   Problem: Education: Goal: Knowledge of General Education information will improve Description: Including pain rating scale, medication(s)/side effects and non-pharmacologic comfort measures Outcome: Progressing   Problem: Health Behavior/Discharge Planning: Goal: Ability to manage health-related needs will improve Outcome: Progressing   Problem: Clinical Measurements: Goal: Ability to maintain clinical measurements within normal limits will improve Outcome: Progressing Goal: Diagnostic test results will improve Outcome: Progressing Goal: Respiratory complications will improve Outcome: Progressing Goal: Cardiovascular complication will be avoided Outcome: Progressing   Problem: Activity: Goal: Risk for activity intolerance will decrease Outcome: Progressing   Problem: Nutrition: Goal: Adequate nutrition will be maintained Outcome: Progressing   Problem: Coping: Goal: Level of anxiety will decrease Outcome: Progressing   Problem: Elimination: Goal: Will not experience complications related to bowel motility Outcome: Progressing Goal: Will not experience complications related to urinary retention Outcome: Progressing   Problem: Safety: Goal: Ability to remain free from injury will improve Outcome: Progressing   Problem: Skin Integrity: Goal: Risk for impaired skin integrity will decrease Outcome: Progressing

## 2021-12-29 DIAGNOSIS — I5033 Acute on chronic diastolic (congestive) heart failure: Secondary | ICD-10-CM | POA: Diagnosis not present

## 2021-12-29 NOTE — Plan of Care (Signed)

## 2021-12-29 NOTE — Plan of Care (Signed)
  Problem: Education: Goal: Ability to demonstrate management of disease process will improve Outcome: Progressing Goal: Ability to verbalize understanding of medication therapies will improve Outcome: Progressing Goal: Individualized Educational Video(s) Outcome: Progressing   Problem: Activity: Goal: Capacity to carry out activities will improve Outcome: Progressing   Problem: Education: Goal: Knowledge of General Education information will improve Description: Including pain rating scale, medication(s)/side effects and non-pharmacologic comfort measures Outcome: Progressing   Problem: Health Behavior/Discharge Planning: Goal: Ability to manage health-related needs will improve Outcome: Progressing   Problem: Clinical Measurements: Goal: Ability to maintain clinical measurements within normal limits will improve Outcome: Progressing Goal: Diagnostic test results will improve Outcome: Progressing Goal: Respiratory complications will improve Outcome: Progressing Goal: Cardiovascular complication will be avoided Outcome: Progressing   Problem: Activity: Goal: Risk for activity intolerance will decrease Outcome: Progressing   Problem: Nutrition: Goal: Adequate nutrition will be maintained Outcome: Progressing   Problem: Coping: Goal: Level of anxiety will decrease Outcome: Progressing   Problem: Elimination: Goal: Will not experience complications related to bowel motility Outcome: Progressing Goal: Will not experience complications related to urinary retention Outcome: Progressing   Problem: Safety: Goal: Ability to remain free from injury will improve Outcome: Progressing   Problem: Skin Integrity: Goal: Risk for impaired skin integrity will decrease Outcome: Progressing

## 2021-12-29 NOTE — Progress Notes (Signed)
Progress Note  Patient: Angel Brewer [id number]:[id number] DOB: May 26, 1966  DOA: 11/21/2021  DOS: 12/29/2021    Brief hospital course: Angel Brewer was admitted to the hospital with the working diagnosis of acute on chronic diastolic heart failure, complicated with acute hypoxemic respiratory failure.   Angel Brewer is a 56 y.o. female with medical history significant for HFpEF (last EF 60-65% October 2022), history of recurrent PE on Eliquis, pulmonary hypertension, HTN, HLD, OSA/OHS on CPAP, obesity class 3. Reported 2 days of worsening dyspnea, orthopnea and cough. On her initial physical examination her blood pressure was 144/83, HR 78, RR 34, 02 saturation 94% on 4 L/min per [place]. Distant breath sounds, with no wheezing, heart with S1 and S2 present and rhythmic, abdomen not distended, positive lower extremity edema.   Chest radiograph with cardiomegaly, bilateral interstitial infiltrates, congested hilar vasculature and cephalization of the vasculature.   5/11 initial admission to hospitalist service.  Started on BiPAP and diuresed successfully for 5 L. 5/13 increasing somnolence and hypercarbia new since admission. 5/15 am intubated. Discussed trach with son who wanted to give her a chance of extubation before proceeding straight to trach 5/16 failed SBT for tachypnea 5/17 failed SBT for tachypnea 5/18 failed SBT for rapid and shallow breathing despite high PS 5/19 tracheostomy placed 5/24 Placed on trach collar mid morning of 5/23 and has remained on ATC since  5/25 stable on trach collar 5/27 Transferred from PCCM to [facility] service  5/28 - Remains stable on tracheostomy managed by pulmonology awaiting transfer to facility (who will not take new trach - much mature at least 30 days per documentation). 6/12-Present - increasing activity levels - able to ambulate OOB with assistance; making good progress with PT; continues to wean oxygen daily as tolerated 6/16 -patient continues to progress with  increasing physical activity  Patient remains medically stable to be transfer to rehab. She still has cuffed trach that probably need to be changed to uncuffed before discharge. Follow up with pulmonary for further recommendations. 30 days has passed from trach placement as of 12/29/21 - medically stable and awaiting placement.  Assessment and Plan:  Acute hypoxemic and hypercapnic respiratory failure due to acute cardiogenic pulmonary edema due to acute on chronic HFpEF and OHS: - s/p tracheostomy 5/19 > ATC 5/24 on 10L/40% FiO2, currently with #6 Shiley uncuffed, using PMV. Management per PCCM weekly and prn. Not likely to decannulate given her use of trilogy at home.  Acute on chronic HFpEF, HTN: Echocardiogram with preserved LV systolic function 60 to 50%, RV systolic function is preserved, no significant valvular disease.  - Restarted home lasix 58m daily. Appears roughly euvolemic s/p significant diuresis earlier in hospitalization. - Continue spironolactone and dapagliflozin.  - Labs stable.  OHS, OSA:  - Weight loss recommended, encouraged to mobilize as much as possible to avoid hypoventilation/atelectasis.   Morbid obesity: Body mass index is 64.81 kg/m.  - Given comorbidities that could be impacted by weight loss, would recommend bariatric surgery evaluation if patient amenable.   History of recurrent PE:  - Continue eliquis.  Anxiety:  - Continue clonazepam low dose scheduled  HLD:   - Continue atorvastatin   Hyponatremia: Mild, stable.   Acute metabolic encephalopathy: Resolved.  Leukocytosis: Resolved without targeted treatment.  Subjective: No acute issues or events overnight; remains in good spirits as she continues to improve  Objective: Vitals:   12/28/21 1938 12/28/21 2000 12/29/21 0020 12/29/21 0342  BP:  (!) 94/58    Pulse: 83 70 66  60  Resp: _0 Temp:  98.3 F (36.8 C)    TempSrc:  Oral    SpO2: 96% 100% 95% 91%  Weight:      Height:        Gen: 56 y.o. female in no distress Pulm: Nonlabored breathing thru trach with PMV. Clear. CV: Regular rate and rhythm. No murmur, rub, or gallop. No JVD, no dependent edema. GI: Abdomen soft, non-tender, non-distended, with normoactive bowel sounds.  Ext: Warm, no deformities Skin: No rashes, lesions or ulcers on visualized skin. Neuro: Alert and oriented. No focal neurological deficits. Psych: Judgement and insight appear fair. Mood euthymic & affect congruent.      Data Personally reviewed: CBC: No results for input(s): "WBC", "NEUTROABS", "HGB", "HCT", "MCV", "PLT" in the last 168 hours.  Basic Metabolic Panel: No results for input(s): "NA", "K", "CL", "CO2", "GLUCOSE", "BUN", "CREATININE", "CALCIUM", "MG", "PHOS" in the last 168 hours.  Family Communication: None at bedside  Disposition: Status is: Inpatient Remains inpatient appropriate because: Unsafe DC; medically stable Planned Discharge Destination: Pending SNF placement, likely not to occur prior to 30 days from trach placement on 5/19   Mahnken Ishikawa, MD 12/29/2021 7:25 AM Page by [email or web address]

## 2021-12-30 ENCOUNTER — Other Ambulatory Visit (HOSPITAL_COMMUNITY): Payer: Self-pay

## 2021-12-30 DIAGNOSIS — I5033 Acute on chronic diastolic (congestive) heart failure: Secondary | ICD-10-CM | POA: Diagnosis not present

## 2021-12-30 MED ORDER — ADULT MULTIVITAMIN W/MINERALS CH
1.0000 | ORAL_TABLET | Freq: Every day | ORAL | 1 refills | Status: DC
  Filled 2021-12-30: qty 30, 30d supply, fill #0

## 2021-12-30 MED ORDER — DAPAGLIFLOZIN PROPANEDIOL 10 MG PO TABS
10.0000 mg | ORAL_TABLET | Freq: Every day | ORAL | 1 refills | Status: DC
  Filled 2021-12-30: qty 30, 30d supply, fill #0

## 2021-12-30 MED ORDER — POLYETHYLENE GLYCOL 3350 17 GM/SCOOP PO POWD
17.0000 g | Freq: Every day | ORAL | 0 refills | Status: DC | PRN
  Filled 2021-12-30: qty 238, 14d supply, fill #0

## 2021-12-30 MED ORDER — PANTOPRAZOLE SODIUM 40 MG PO TBEC
40.0000 mg | DELAYED_RELEASE_TABLET | Freq: Every day | ORAL | 1 refills | Status: DC
  Filled 2021-12-30: qty 30, 30d supply, fill #0

## 2021-12-30 MED ORDER — DICYCLOMINE HCL 10 MG PO CAPS
10.0000 mg | ORAL_CAPSULE | Freq: Two times a day (BID) | ORAL | 1 refills | Status: DC | PRN
  Filled 2021-12-30: qty 30, 15d supply, fill #0

## 2021-12-30 MED ORDER — ONDANSETRON HCL 4 MG PO TABS
4.0000 mg | ORAL_TABLET | Freq: Four times a day (QID) | ORAL | 0 refills | Status: DC | PRN
  Filled 2021-12-30: qty 20, 5d supply, fill #0

## 2021-12-30 MED ORDER — CLONAZEPAM 0.25 MG PO TBDP: 0.2500 mg | ORAL_TABLET | Freq: Two times a day (BID) | ORAL | 0 refills | Status: DC

## 2021-12-30 MED ORDER — SPIRONOLACTONE 25 MG PO TABS
25.0000 mg | ORAL_TABLET | Freq: Every day | ORAL | 1 refills | Status: DC
  Filled 2021-12-30: qty 30, 30d supply, fill #0

## 2021-12-30 NOTE — Progress Notes (Signed)
NAMETeriah Brewer, MRN:  [id number], DOB:  04-19-1966, LOS: 3 ADMISSION DATE:  11/21/2021, CONSULTATION DATE: 11/23/2021 REFERRING MD:  Angel Brewer, CHIEF COMPLAINT: Obtundation  History of Present Illness:  56 year old woman who was admitted with acute on chronic hypoxic/hypercapnic respiratory failure, requiring intubation and now s/p trach.   Pertinent  Medical History  Morbid Obesity  OSA - on CPAP  DVT - during pregnancy, RLE 02/2016  Significant Hospital Events: Including procedures, antibiotic start and stop dates in addition to other pertinent events   5/11 initial admission to hospitalist service.  Started on BiPAP and diuresed successfully for 5 L. 5/13 increasing somnolence and hypercarbia new since admission. 5/15 am intubated. Discussed trach with son who wanted to give her a chance of extubation before proceeding straight to trach 5/16 failed SBT for tachypnea 5/17 failed SBT for tachypnea 5/18 failed SBT for rapid and shallow breathing despite high PS 5/19 tracheostomy placed 5/24 Placed on trach collar mid morning of 5/23 and has remained on ATC since  5/25 Remains on ATC this am, continues to have secretions but appears to be managing them well  6/12 Tolerating PMV, on 28%  Interim History / Subjective:  Afebrile  On 28% ATC  Pt denies cough, sputum production. States normal voice quality, no difficulties with PMV  Objective   Blood pressure 127/71, pulse (!) 59, temperature 98.5 F (36.9 C), temperature source Oral, resp. rate 18, height 5' 7" (1.702 m), weight (!) 187.7 kg, last menstrual period 11/29/2015, SpO2 97 %.    FiO2 (%):  [28 %] 28 %   Intake/Output Summary (Last 24 hours) at 12/30/2021 1118 Last data filed at 12/30/2021 0500 Gross per 24 hour  Intake --  Output 900 ml  Net -900 ml   Filed Weights   12/17/21 0524 12/18/21 0511 12/19/21 0500  Weight: (!) 185.4 kg (!) 185 kg (!) 187.7 kg    Examination: General: adult female lying in bed  in NAD  HEENT: MM pink/moist, #8 trach midline c/d/I, PMV in place, anicteric, good dentition  Neuro: AAOx4, MAE CV: s1s2 RRR, no m/r/g PULM: non-labored at rest, lungs bilaterally clear with good air entry  GI: soft, bsx4 active  Extremities: warm/dry, no edema  Skin: no rashes or lesions  Assessment & Plan:   Acute on chronic respiratory failure with hypoxia and hypercapnia Obstructive sleep apnea/obesity hypoventilation syndrome-tracheostomy tube in place  -ATC 28%, can wean to RA with humidity if tolerates -trach care per protocol  -will need outpatient trach clinic follow up with Angel Griffon, NP at discharge  -at this point, patient is not a candidate for decannulation (previously was using Trilogy at home) -PMV as tolerated  -mobilize - PT efforts  -consider nutrition for weight counseling     Rest per TRH / primary SVC.    PCCM will continue to follow once per week. Please call sooner if new needs arise.        Noe Gens, MSN, APRN, NP-C, AGACNP-BC [facility] 12/30/2021, 11:18 AM   Please see [email or web address] for pager details.   From 7A-7P if no response, please call [phone] After hours, please call ELink [phone]

## 2021-12-30 NOTE — Progress Notes (Signed)
Physical Therapy Treatment Patient Details Name: Angel Brewer MRN: [id number] DOB: Sep 25, 1965 Today's Date: 12/30/2021   History of Present Illness Pt is a 56 y.o. female admitted 11/21/21 with SOB; workup for acute on chronic HF. Transfer to ICU secondary to hypercarbic respiratory failure with increased obtundation. ETT 5/15; s/p trach 5/19; trach collar 5/23. PMH includes HTN, DVT, recurrent PE on Eliquis, morbid obesity, wrist ligament reconstruction.    PT Comments    Patient highly motivated and eager to work with PT. Requires min assist with OOB due to slipperiness of air mattress. She reports her walking is now limited by her knee and back pain, not by her breathing. Sats 91% on 28% trach collar while walking 140 ft. Fully participates in all exercises.     Recommendations for follow up therapy are one component of a multi-disciplinary discharge planning process, led by the attending physician.  Recommendations may be updated based on patient status, additional functional criteria and insurance authorization.  Follow Up Recommendations  Skilled nursing-short term rehab (<3 hours/day)     Assistance Recommended at Discharge Frequent or constant Supervision/Assistance  Patient can return home with the following Help with stairs or ramp for entrance;Assist for transportation;Direct supervision/assist for financial management;Assistance with cooking/housework;A [place]a help with walking and/or transfers;A lot of help with bathing/dressing/bathroom   Equipment Recommendations  Wheelchair (measurements PT);Wheelchair cushion (measurements PT);Hospital bed;Rolling walker (2 wheels) (bariatric size)    Recommendations for Other Services       Precautions / Restrictions Precautions Precautions: Fall Precaution Comments: trach 28% FiO2, PSMV Restrictions Weight Bearing Restrictions: No     Mobility  Bed Mobility Overal bed mobility: Needs Assistance Bed Mobility: Supine to Sit,  Sit to Supine     Supine to sit: Min assist, HOB elevated Sit to supine: Min assist   General bed mobility comments: comes OOB with mattress inflated, needing to pull on rail with one hand and therapist's hand on other side; return to bed needs min assist to lift lead leg up onto mattresss    Transfers Overall transfer level: Needs assistance Equipment used: Rolling walker (2 wheels) Transfers: Sit to/from Stand, Bed to chair/wheelchair/BSC Sit to Stand: Min guard   Step pivot transfers: Min guard       General transfer comment: from EOB minguard due to air mattress; from recliner stood x 10 reps    Ambulation/Gait Ambulation/Gait assistance: Min guard Gait Distance (Feet): 140 Feet Assistive device: Rolling walker (2 wheels) (bariatric) Gait Pattern/deviations: Wide base of support, Decreased stride length Gait velocity: decreased     General Gait Details: SpO2 WFL on 28% FiO2 via trach venturi mask   Stairs             Wheelchair Mobility    Modified Rankin (Stroke Patients Only)       Balance Overall balance assessment: Needs assistance Sitting-balance support: Feet supported Sitting balance-[name]: Fair Sitting balance - Comments: sat on EOB with supervision due to air mattress, slight lean toward HOB on R   Standing balance support: No upper extremity supported Standing balance-[name]: Fair Standing balance comment: no LOB standing at RW, able to static stand without UE support                            Cognition Arousal/Alertness: Awake/alert Behavior During Therapy: WFL for tasks assessed/performed Overall Cognitive Status: Within Functional Limits for tasks assessed  General Comments: eager to participate in therapy        Exercises General Exercises - Lower Extremity Ankle Circles/Pumps: AROM, 20 reps Long Arc Quad: AROM, 20 reps Hip ABduction/ADduction: AROM, Both, 20  reps, Standing Hip Flexion/Marching: AROM, Both, Standing, 20 reps Other Exercises Other Exercises: serial STS x 10 reps using arm rests    General Comments        Pertinent Vitals/Pain Pain Assessment Pain Assessment: Faces Faces Pain Scale: Hurts Wronski more Pain Location: low back Pain Descriptors / Indicators: Discomfort, Sore Pain Intervention(s): Monitored during session, Repositioned    Home Living                          Prior Function            PT Goals (current goals can now be found in the care plan section) Acute Rehab PT Goals Patient Stated Goal: get back to work PT Goal Formulation: With patient Time For Goal Achievement: 01/13/22 Potential to Achieve Goals: Good Progress towards PT goals: Progressing toward goals    Frequency    Min 3X/week      PT Plan Current plan remains appropriate    Co-evaluation              AM-PAC PT "6 Clicks" Mobility   Outcome Measure  Help needed turning from your back to your side while in a flat bed without using bedrails?: A Craine Help needed moving from lying on your back to sitting on the side of a flat bed without using bedrails?: A Lot Help needed moving to and from a bed to a chair (including a wheelchair)?: A Ehmann Help needed standing up from a chair using your arms (e.g., wheelchair or bedside chair)?: A Kresse Help needed to walk in hospital room?: A Philipps Help needed climbing 3-5 steps with a railing? : Total 6 Click Score: 15    End of Session Equipment Utilized During Treatment: Oxygen Activity Tolerance: Patient tolerated treatment well Patient left: with call bell/phone within reach;in bed Nurse Communication: Mobility status PT Visit Diagnosis: Unsteadiness on feet (R26.81);Muscle weakness (generalized) (M62.81);Difficulty in walking, not elsewhere classified (R26.2) Pain - Right/Left: Right Pain - part of body: Knee     Time: 4801-6553 PT Time Calculation (min)  (ACUTE ONLY): 40 min  Charges:  $Gait Training: 8-22 mins $Therapeutic Exercise: 23-37 mins                      Angel Brewer, PT Acute Rehabilitation Services  Office [phone]    Angel Brewer 12/30/2021, 3:08 PM

## 2021-12-30 NOTE — TOC Progression Note (Addendum)
Transition of Care [facility]) - Progression Note    Patient Details  Name: Angel Brewer MRN: [id number] Date of Birth: 1966-01-18  Transition of Care [facility]) CM/SW [place], RN Phone Number: 12/30/2021, 8:49 AM  Clinical Narrative:    CM left a message for Maudie Mercury, Admission director with [facility] and [place], [place] with [facility] in [place] to inquire if either facility is able to offer firm bed offer to the patient.  No firm bed offers are available for disposition at this time.  Attending physician, Dr. Avon Gully was made aware that I am continuing to explore options for SNF placement and start of insurance authorization - no beds are available at this time.  12/30/21 - 1030 - I called and spoke with Tressa Busman [facility] and they are reviewing the patient's clinicals at this time for possible bed offer.  The patient's trach is 89 days old today but will need a firm bed offer from a SNF facility and then authorization started from the accepting facility for approval which may take 1-2 days to complete.  The patient has Managed [facility] and tracheostomy.  Clinicals were emailed to [email or web address] to review.  Attending physician, Dr. Avon Gully was updated.  I also spoke with Maudie Mercury, CM at [facility] and she is currently reviewing the patient 's clinicals at this time but no firm bed offer at this time.  12/30/21 - [facility] are unable to offer SNF placement at this time.  [facility] is reviewing.  CM and MSW with DTP Team will continue to follow the patient for SNF placement and start of insurance authorization once bed is determined - no firm bed offer at this time.  Expected Discharge Plan: [place] Barriers to Discharge: No SNF bed, Continued Medical Work up  Expected Discharge Plan and Services Expected Discharge Plan: [place] In-house Referral: PCP / Health  Connect Discharge Planning Services: CM Consult Post Acute Care Choice: [place] Living arrangements for the past 2 months: [place] Expected Discharge Date: 12/30/21                 DME Agency: [employer]                   Social Determinants of Health (SDOH) Interventions Food Insecurity Interventions: Intervention Not Indicated Transportation Interventions: PTAR [facility])  Readmission Risk Interventions    12/20/2021   11:33 AM 12/02/2019    1:47 PM  Readmission Risk Prevention Plan  Post Dischage Appt  Complete  Medication Screening  Complete  Transportation Screening Complete Complete  PCP or Specialist Appt within 3-5 Days Complete   HRI or [place] Complete   Social Work Consult for [place] Planning/Counseling Complete   Palliative Care Screening Not Applicable   Medication Review Press photographer) Complete

## 2021-12-30 NOTE — NC FL2 (Signed)
[place] MEDICAID FL2 LEVEL OF CARE SCREENING TOOL     IDENTIFICATION  Patient Name: Angel Brewer Birthdate: 1966-05-14 Sex: female Admission Date (Current Location): 11/21/2021  [place] and [place] Number:  Kathleen Argue [id number] Facility and Address:  [facility] [place], [place] [address], [place],  [address]      Provider Number: [id number]  Attending Physician Name and Address:  Cornick Ishikawa, MD  Relative Name and Phone Number:       Current Level of Care: Hospital Recommended Level of Care: [place] Prior Approval Number:    Date Approved/Denied:   PASRR Number: [id number]  Discharge Plan: SNF    Current Diagnoses: Patient Active Problem List   Diagnosis Date Noted   Hyponatremia 73/53/2992   Acute metabolic encephalopathy 42/68/3419   Acute on chronic heart failure with preserved ejection fraction (HFpEF) ([place]) 11/21/2021   History of pulmonary embolus (PE) 11/21/2021   Hyperlipidemia 11/21/2021   COPD (chronic obstructive pulmonary disease) ([place]) 05/08/2021   Obesity hypoventilation syndrome ([place]) 05/03/2021   OSA (obstructive sleep apnea) 08/18/2020   Chronic deep vein thrombosis (DVT) of femoral vein of left lower extremity ([place]) 02/09/2020   Chronic anticoagulation 02/09/2020   Right wrist fracture 06/14/2019   Chronic respiratory failure with hypoxia ([place]) 06/13/2019   Essential hypertension 08/25/2017   Class 3 obesity ([place]) 10/29/2016   Vitamin D deficiency 10/29/2016    Orientation RESPIRATION BLADDER Height & Weight     Self, Time, Situation, Place  Tracheostomy, O2 External catheter Weight: (!) 187.7 kg Height:  _0  (170.2 cm)  BEHAVIORAL SYMPTOMS/MOOD NEUROLOGICAL BOWEL NUTRITION STATUS      Continent Diet (Regular Diet)  AMBULATORY STATUS COMMUNICATION OF NEEDS Skin   Extensive Assist Verbally Normal                       Personal Care Assistance Level of Assistance  Bathing,  Feeding, Dressing Bathing Assistance: Maximum assistance Feeding assistance: Independent Dressing Assistance: Maximum assistance     Functional Limitations Info  Sight, Hearing, Speech Sight Info: Adequate Hearing Info: Adequate Speech Info: Impaired (Tracheostomy)    SPECIAL CARE FACTORS FREQUENCY  PT (By licensed PT), OT (By licensed OT)     PT Frequency: 5 x per week OT Frequency: 5 x per week            Contractures Contractures Info: Not present    Additional Factors Info  Allergies, Code Status, Suctioning Needs Code Status Info: Full code Allergies Info: NKDA           Current Medications (12/30/2021):  This is the current hospital active medication list Current Facility-Administered Medications  Medication Dose Route Frequency Provider Last Rate Last Admin   0.9 %  sodium chloride infusion  250 mL Intravenous Continuous Maryjane Hurter, MD   Stopped at 12/04/21 1440   acetaminophen (TYLENOL) tablet 650 mg  650 mg Oral Q6H PRN Arrien, Jimmy Picket, MD       Or   acetaminophen (TYLENOL) suppository 650 mg  650 mg Rectal Q6H PRN Arrien, Jimmy Picket, MD       albuterol (PROVENTIL) (2.5 MG/3ML) 0.083% nebulizer solution 3 mL  3 mL Inhalation Q4H PRN Lenore Cordia, MD       apixaban Arne [place]) tablet 5 mg  5 mg Oral BID Arrien, Jimmy Picket, MD   5 mg at 12/30/21 0920   atorvastatin (LIPITOR) tablet 20 mg  20 mg Oral Daily Patrecia Pour,  MD   20 mg at 12/30/21 0920   clonazePAM (KLONOPIN) disintegrating tablet 0.25 mg  0.25 mg Oral BID Arrien, Jimmy Picket, MD   0.25 mg at 12/30/21 0762   dapagliflozin propanediol (FARXIGA) tablet 10 mg  10 mg Oral Daily Arrien, Jimmy Picket, MD   10 mg at 12/30/21 2633   dicyclomine (BENTYL) capsule 10 mg  10 mg Oral BID PRN Domenic Polite, MD   10 mg at 12/10/21 1314   feeding supplement (ENSURE ENLIVE / ENSURE PLUS) liquid 237 mL  237 mL Oral BID BM Candee Furbish, MD   237 mL at 12/26/21 1506   furosemide  (LASIX) tablet 40 mg  40 mg Oral Daily Patrecia Pour, MD   40 mg at 12/30/21 3545   lip balm (CARMEX) ointment   Topical PRN Spero Geralds, MD       MEDLINE mouth rinse  15 mL Mouth Rinse q12n4p Candee Furbish, MD   15 mL at 12/29/21 1544   multivitamin with minerals tablet 1 tablet  1 tablet Oral Daily Arrien, Jimmy Picket, MD   1 tablet at 12/25/21 0934   ondansetron (ZOFRAN) tablet 4 mg  4 mg Oral Q6H PRN Arrien, Jimmy Picket, MD   4 mg at 12/23/21 1423   Or   ondansetron (ZOFRAN) injection 4 mg  4 mg Intravenous Q6H PRN Arrien, Jimmy Picket, MD   4 mg at 12/13/21 0115   oxyCODONE (Oxy IR/ROXICODONE) immediate release tablet 5 mg  5 mg Oral Q6H PRN Arrien, Jimmy Picket, MD       pantoprazole (PROTONIX) EC tablet 40 mg  40 mg Oral Daily Arrien, Jimmy Picket, MD   40 mg at 12/30/21 6256   polyethylene glycol (MIRALAX / GLYCOLAX) packet 17 g  17 g Oral Daily PRN Arrien, Jimmy Picket, MD       senna-docusate (Senokot-S) tablet 1 tablet  1 tablet Oral BID Arrien, Jimmy Picket, MD   1 tablet at 12/25/21 0934   simethicone (MYLICON) chewable tablet 80 mg  80 mg Oral QID PRN Vianne Bulls, MD   80 mg at 12/15/21 0216   spironolactone (ALDACTONE) tablet 25 mg  25 mg Oral Daily Patrecia Pour, MD   25 mg at 12/30/21 3893     Discharge Medications: Please see discharge summary for a list of discharge medications.  Relevant Imaging Results:  Relevant Lab Results:   Additional Information SSN [id number] [place], [place]

## 2021-12-30 NOTE — Discharge Summary (Incomplete)
Physician Discharge Summary  Angel Brewer EFE:[id number] DOB: 06-22-1966 DOA: 11/21/2021  PCP: [name], NP  Admit date: 11/21/2021 Discharge date: 12/30/2021  Admitted From: *** Disposition:  ***  Recommendations for Outpatient Follow-up:  Follow up with PCP in 1-2 weeks Please obtain BMP/CBC in one week Please follow up on the following pending results:  Home Health:***  Equipment/Devices:***  Discharge Condition:***  CODE STATUS:***  Diet recommendation:    Brief/Interim Summary: ***  Discharge Diagnoses:  Principal Problem:   Acute on chronic heart failure with preserved ejection fraction (HFpEF) ([place]) Active Problems:   Obesity hypoventilation syndrome (HCC)   History of pulmonary embolus (PE)   Acute metabolic encephalopathy   Essential hypertension   Hyperlipidemia   Hyponatremia   Class 3 obesity [facility])    Discharge Instructions  Discharge Instructions     Discharge patient   Complete by: As directed    Discharge disposition: 03-Skilled Nursing Facility   Discharge patient date: 12/30/2021      Allergies as of 12/30/2021   No Known Allergies      Medication List     STOP taking these medications    amLODipine 5 MG tablet Commonly known as: NORVASC   Blood Pressure Monitor Devi   Misc. Devices Misc       TAKE these medications    acetaminophen 650 MG CR tablet Commonly known as: TYLENOL Take 1,300 mg by mouth every 8 (eight) hours as needed for pain or fever.   albuterol 108 (90 Base) MCG/ACT inhaler Commonly known as: VENTOLIN HFA Inhale 2 puffs into the lungs every 4 (four) hours as needed for wheezing or shortness of breath.   apixaban 5 MG Tabs tablet Commonly known as: ELIQUIS Take 1 tablet (5 mg total) by mouth 2 (two) times daily.   atorvastatin 20 MG tablet Commonly known as: LIPITOR TAKE 1 TABLET BY MOUTH EVERY DAY   clonazePAM 0.25 MG disintegrating tablet Commonly known as: KLONOPIN Take 1 tablet (0.25 mg  total) by mouth 2 (two) times daily.   dapagliflozin propanediol 10 MG Tabs tablet Commonly known as: FARXIGA Take 1 tablet (10 mg total) by mouth daily.   dicyclomine 10 MG capsule Commonly known as: BENTYL Take 1 capsule (10 mg total) by mouth 2 (two) times daily as needed for spasms.   furosemide 40 MG tablet Commonly known as: LASIX TAKE 1 TABLET BY MOUTH EVERY DAY   multivitamin with minerals Tabs tablet Take 1 tablet by mouth daily.   ondansetron 4 MG tablet Commonly known as: ZOFRAN Take 1 tablet (4 mg total) by mouth every 6 (six) hours as needed for nausea.   pantoprazole 40 MG tablet Commonly known as: PROTONIX Take 1 tablet (40 mg total) by mouth daily.   polyethylene glycol 17 g packet Commonly known as: MIRALAX / GLYCOLAX Take 17 g by mouth daily as needed for mild constipation.   PRESCRIPTION MEDICATION 1 each by Other route at bedtime. CPAP- At bedtime   spironolactone 25 MG tablet Commonly known as: ALDACTONE Take 1 tablet (25 mg total) by mouth daily.        No Known Allergies  Consultations: ***Specify Physician/Group   Procedures/Studies: DG CHEST PORT 1 VIEW  Result Date: 12/05/2021 CLINICAL DATA:  Tracheostomy. History of pulmonary embolism. Sleep apnea. Large body habitus. EXAM: PORTABLE CHEST 1 VIEW COMPARISON:  AP chest 11/30/2021 FINDINGS: Tracheostomy tube overlies the midline trachea. Enteric tube descends below the diaphragm with the tip excluded by collimation. Right upper extremity PICC  tip overlies the central superior vena cava, similar to prior. Cardiac silhouette is again moderately enlarged. Mediastinal contours are grossly unchanged. Moderate bilateral interstitial thickening. Right mid lung and left mid and lower lung heterogeneous airspace opacities are similar to prior. Mildly improved right lower lung aeration. No definite pleural effusion. No pneumothorax. Moderate multilevel degenerative changes of the thoracic spine.  IMPRESSION: Moderate interstitial pulmonary edema with left-greater-than-right alveolar opacity suggesting alveolar pulmonary edema. This is very similar to prior. There is mildly improved aeration of the inferior right lung. Electronically Signed   By: Yvonne Kendall M.D.   On: 12/05/2021 08:33   DG Abd Portable 1V  Result Date: 12/02/2021 CLINICAL DATA:  Feeding tube placement EXAM: PORTABLE ABDOMEN - 1 VIEW COMPARISON:  11/25/2021, 11/24/2021 FINDINGS: Esophageal tube tip overlies the stomach. Mild air distension bowel in the upper abdomen. Patchy airspace disease at the bases. IMPRESSION: Esophageal tube tip overlies the stomach Electronically Signed   By: Donavan Foil M.D.   On: 12/02/2021 15:10     Subjective: ***   Discharge Exam: Vitals:   12/30/21 0432 12/30/21 0716  BP:  127/71  Pulse: 65 (!) 53  Resp: 16   Temp:  98.5 F (36.9 C)  SpO2: 92% 97%   Vitals:   12/29/21 1946 12/29/21 2326 12/30/21 0432 12/30/21 0716  BP: 107/71   127/71  Pulse: 65 64 65 (!) 53  Resp: _0 Temp: 98.4 F (36.9 C)   98.5 F (36.9 C)  TempSrc: Oral   Oral  SpO2: 96% 94% 92% 97%  Weight:      Height:        General: Pt is alert, awake, not in acute distress Cardiovascular: RRR, S1/S2 +, no rubs, no gallops Respiratory: CTA bilaterally, no wheezing, no rhonchi Abdominal: Soft, NT, ND, bowel sounds + Extremities: no edema, no cyanosis    The results of significant diagnostics from this hospitalization (including imaging, microbiology, ancillary and laboratory) are listed below for reference.     Microbiology: No results found for this or any previous visit (from the past 240 hour(s)).   Labs: BNP (last 3 results) Recent Labs    04/30/21 1821 11/21/21 1827  BNP 85.2 574.9*   Basic Metabolic Panel: No results for input(s): "NA", "K", "CL", "CO2", "GLUCOSE", "BUN", "CREATININE", "CALCIUM", "MG", "PHOS" in the last 168 hours. Liver Function Tests: No results for input(s):  "AST", "ALT", "ALKPHOS", "BILITOT", "PROT", "ALBUMIN" in the last 168 hours. No results for input(s): "LIPASE", "AMYLASE" in the last 168 hours. No results for input(s): "AMMONIA" in the last 168 hours. CBC: No results for input(s): "WBC", "NEUTROABS", "HGB", "HCT", "MCV", "PLT" in the last 168 hours. Cardiac Enzymes: No results for input(s): "CKTOTAL", "CKMB", "CKMBINDEX", "TROPONINI" in the last 168 hours. BNP: Invalid input(s): "POCBNP" CBG: No results for input(s): "GLUCAP" in the last 168 hours. D-Dimer No results for input(s): "DDIMER" in the last 72 hours. Hgb A1c No results for input(s): "HGBA1C" in the last 72 hours. Lipid Profile No results for input(s): "CHOL", "HDL", "LDLCALC", "TRIG", "CHOLHDL", "LDLDIRECT" in the last 72 hours. Thyroid function studies No results for input(s): "TSH", "T4TOTAL", "T3FREE", "THYROIDAB" in the last 72 hours.  Invalid input(s): "FREET3" Anemia work up No results for input(s): "VITAMINB12", "FOLATE", "FERRITIN", "TIBC", "IRON", "RETICCTPCT" in the last 72 hours. Urinalysis    Component Value Date/Time   COLORURINE AMBER (A) 11/23/2019 0220   APPEARANCEUR CLOUDY (A) 11/23/2019 0220   LABSPEC 1.027 11/23/2019 [place]  5.0 11/23/2019 0220   GLUCOSEU NEGATIVE 11/23/2019 0220   HGBUR NEGATIVE 11/23/2019 0220   BILIRUBINUR NEGATIVE 11/23/2019 0220   KETONESUR NEGATIVE 11/23/2019 0220   PROTEINUR 100 (A) 11/23/2019 0220   NITRITE NEGATIVE 11/23/2019 0220   LEUKOCYTESUR NEGATIVE 11/23/2019 0220   Sepsis Labs No results for input(s): "WBC" in the last 168 hours.  Invalid input(s): "PROCALCITONIN", "LACTICIDVEN" Microbiology No results found for this or any previous visit (from the past 240 hour(s)).   Time coordinating discharge: Over 30 minutes  SIGNED:   Hearst Ishikawa, DO [facility] 12/30/2021, 8:02 AM Pager   If 7PM-7AM, please contact night-coverage [email or web address]

## 2021-12-30 NOTE — Progress Notes (Signed)
Progress Note  Patient: Angel Brewer [id number]:[id number] DOB: 01/18/1966  DOA: 11/21/2021  DOS: 12/30/2021    Brief hospital course: Angel Brewer was admitted to the hospital with the working diagnosis of acute on chronic diastolic heart failure, complicated with acute hypoxemic respiratory failure.   Angel Brewer is a 56 y.o. female with medical history significant for HFpEF (last EF 60-65% October 2022), history of recurrent PE on Eliquis, pulmonary hypertension, HTN, HLD, OSA/OHS on CPAP, obesity class 3. Reported 2 days of worsening dyspnea, orthopnea and cough. On her initial physical examination her blood pressure was 144/83, HR 78, RR 34, 02 saturation 94% on 4 L/min per Angel Brewer. Distant breath sounds, with no wheezing, heart with S1 and S2 present and rhythmic, abdomen not distended, positive lower extremity edema.   Chest radiograph with cardiomegaly, bilateral interstitial infiltrates, congested hilar vasculature and cephalization of the vasculature.   5/11 initial admission to hospitalist service.  Started on BiPAP and diuresed successfully for 5 L. 5/13 increasing somnolence and hypercarbia new since admission. 5/15 am intubated. Discussed trach with son who wanted to give her a chance of extubation before proceeding straight to trach 5/16 failed SBT for tachypnea 5/17 failed SBT for tachypnea 5/18 failed SBT for rapid and shallow breathing despite high PS 5/19 tracheostomy placed 5/24 Placed on trach collar mid morning of 5/23 and has remained on ATC since  5/25 stable on trach collar 5/27 Transferred from PCCM to [facility] service  5/28 - Remains stable on tracheostomy managed by pulmonology awaiting transfer to facility (who will not take new trach - much mature at least 30 days per documentation). 6/12-Present - increasing activity levels - able to ambulate OOB with assistance; making good progress with PT; continues to wean oxygen daily as tolerated 6/16 -patient continues to progress with  increasing physical activity  Patient remains medically stable to be transfer to rehab. She still has cuffed trach that probably need to be changed to uncuffed before discharge. Follow up with pulmonary for further recommendations. 30 days has passed from trach placement as of 12/29/21 - medically stable and awaiting placement/bed offer/insurance approval pending.  Assessment and Plan:  Acute hypoxemic and hypercapnic respiratory failure due to acute cardiogenic pulmonary edema due to acute on chronic HFpEF and OHS: - s/p tracheostomy 5/19 > ATC 5/24 on 10L/40% FiO2, currently with #6 Shiley uncuffed, using PMV. Management per PCCM weekly and prn. Not likely to decannulate given her use of trilogy at home.  Acute on chronic HFpEF, HTN: Echocardiogram with preserved LV systolic function 60 to 75%, RV systolic function is preserved, no significant valvular disease.  - Restarted home lasix 30m daily. Appears roughly euvolemic s/p significant diuresis earlier in hospitalization. - Continue spironolactone and dapagliflozin.  - Labs stable.  OHS, OSA:  - Weight loss recommended, encouraged to mobilize as much as possible to avoid hypoventilation/atelectasis.   Morbid obesity: Body mass index is 64.81 kg/m.  - Given comorbidities that could be impacted by weight loss, would recommend bariatric surgery evaluation if patient amenable.   History of recurrent PE:  - Continue eliquis.  Anxiety:  - Continue clonazepam low dose scheduled  HLD:   - Continue atorvastatin   Hyponatremia: Mild, stable.   Acute metabolic encephalopathy: Resolved.  Leukocytosis: Resolved without targeted treatment.  Subjective: No acute issues or events overnight; remains in good spirits as she continues to improve  Objective: Vitals:   12/30/21 0432 12/30/21 0716 12/30/21 0844 12/30/21 1138  BP:  127/71    Pulse: 65 (!)  53 (!) 59 (!) 56  Resp: _0 Temp:  98.5 F (36.9 C)    TempSrc:  Oral    SpO2:  92% 97% 97% 98%  Weight:      Height:       Gen: 56 y.o. female in no distress Pulm: Nonlabored breathing thru trach with PMV. Clear. CV: Regular rate and rhythm. No murmur, rub, or gallop. No JVD, no dependent edema. GI: Abdomen soft, non-tender, non-distended, with normoactive bowel sounds.  Ext: Warm, no deformities Skin: No rashes, lesions or ulcers on visualized skin. Neuro: Alert and oriented. No focal neurological deficits. Psych: Judgement and insight appear fair. Mood euthymic & affect congruent.      Data Personally reviewed: CBC: No results for input(s): "WBC", "NEUTROABS", "HGB", "HCT", "MCV", "PLT" in the last 168 hours.  Basic Metabolic Panel: No results for input(s): "NA", "K", "CL", "CO2", "GLUCOSE", "BUN", "CREATININE", "CALCIUM", "MG", "PHOS" in the last 168 hours.  Family Communication: None at bedside  Disposition: Status is: Inpatient Remains inpatient appropriate because: Unsafe DC; medically stable Planned Discharge Destination: Pending SNF placement, likely not to occur prior to 30 days from trach placement on 5/19   Angel Ishikawa, MD 12/30/2021 11:58 AM Page by Angel Brewer

## 2021-12-31 ENCOUNTER — Other Ambulatory Visit (HOSPITAL_COMMUNITY): Payer: Self-pay

## 2021-12-31 DIAGNOSIS — I5033 Acute on chronic diastolic (congestive) heart failure: Secondary | ICD-10-CM | POA: Diagnosis not present

## 2021-12-31 NOTE — Plan of Care (Signed)
Goals all met by 12/31/21

## 2021-12-31 NOTE — Progress Notes (Signed)
Occupational Therapy Treatment and goal update Patient Details Name: Arleta Ostrum MRN: [id number] DOB: 07-04-1966 Today's Date: 12/31/2021   History of present illness Pt is a 56 y.o. female admitted 11/21/21 with SOB; workup for acute on chronic HF. Transfer to ICU secondary to hypercarbic respiratory failure with increased obtundation. ETT 5/15; s/p trach 5/19; trach collar 5/23. PMH includes HTN, DVT, recurrent PE on Eliquis, morbid obesity, wrist ligament reconstruction.   OT comments  Pt making great improvements with all adls and functional mobility. Pt is currently at min assist to supervision level with most adls. Pt currently is on an air mattress that makes it very difficult to be independent.  Will speak to nursing to see if pt could be moved off of this mattress.  Also feel pt is so mobile that pt may not need a purewick at this time.  Feel pt is independent enough to call and get to [facility] with min guard to tend to toileting needs.  All goals have been met and new goals have been established.     Recommendations for follow up therapy are one component of a multi-disciplinary discharge planning process, led by the attending physician.  Recommendations may be updated based on patient status, additional functional criteria and insurance authorization.    Follow Up Recommendations  Skilled nursing-short term rehab (<3 hours/day)    Assistance Recommended at Discharge Frequent or constant Supervision/Assistance  Patient can return home with the following  A [place]ell help with walking and/or transfers;A [place]ell help with bathing/dressing/bathroom;Assist for transportation;Assistance with cooking/housework   Equipment Recommendations  Other (comment) (tbd)    Recommendations for Other Services      Precautions / Restrictions Precautions Precautions: Fall Restrictions Weight Bearing Restrictions: No       Mobility Bed Mobility Overal bed mobility: Needs Assistance Bed Mobility:  Supine to Sit Rolling: Min assist   Supine to sit: Min assist, HOB elevated     General bed mobility comments: Pt uses bedrail and one other person to pull up on because of inflated mattress.  If pt were on regular bed, feel pt could get up without assist.    Transfers Overall transfer level: Needs assistance Equipment used: Rolling walker (2 wheels) Transfers: Sit to/from Stand, Bed to chair/wheelchair/BSC Sit to Stand: Min guard Stand pivot transfers: Min assist         General transfer comment: Pt requiring min guard to occasional min assist to come sit to stand due to air mattress.     Balance Overall balance assessment: Needs assistance Sitting-balance support: Feet supported Sitting balance-[name]: Fair Sitting balance - Comments: sat on EOB with supervision due to air mattress, slight lean toward HOB on R   Standing balance support: No upper extremity supported Standing balance-[name]: Fair Standing balance comment: Pt let go of walker to stand statically without assist for appx 10 seconds.                           ADL either performed or assessed with clinical judgement   ADL Overall ADL's : Needs assistance/impaired Eating/Feeding: Independent;Sitting   Grooming: Wash/dry hands;Supervision/safety;Standing Grooming Details (indicate cue type and reason): Pt walked to sink to groom in standing. Stood at sink for appx 1 minute with supervision. Upper Body Bathing: Minimal assistance;Sitting   Lower Body Bathing: Moderate assistance;Sit to/from stand Lower Body Bathing Details (indicate cue type and reason): pt would benefit from long sponge at home to assist wtih reaching  all areas due to obesity. Upper Body Dressing : Minimal assistance;Sitting   Lower Body Dressing: Moderate assistance;Sit to/from stand;Cueing for compensatory techniques Lower Body Dressing Details (indicate cue type and reason): Pt has reacher and sock aid at home which  will allow pt to be much more independent. Toilet Transfer: Minimal assistance;Ambulation;Rolling walker (2 wheels);Comfort height toilet;Grab bars Toilet Transfer Details (indicate cue type and reason): Pt walked to bathroom to toilet with walker Toileting- Clothing Manipulation and Hygiene: Min guard;Sitting/lateral lean       Functional mobility during ADLs: Minimal assistance;Rolling walker (2 wheels) General ADL Comments: Pt making great improvements with all adls and is overall at a min assist to supervision level. Pt would benefit from having her adaptive equipment in the room to increase independence with adls.    Extremity/Trunk Assessment Upper Extremity Assessment Upper Extremity Assessment: Overall WFL for tasks assessed   Lower Extremity Assessment Lower Extremity Assessment: Defer to PT evaluation        Vision   Vision Assessment?: No apparent visual deficits   Perception Perception Perception: Within Functional Limits   Praxis Praxis Praxis: Intact    Cognition Arousal/Alertness: Awake/alert Behavior During Therapy: WFL for tasks assessed/performed Overall Cognitive Status: Within Functional Limits for tasks assessed                                 General Comments: motivated with great participation        Exercises      Shoulder Instructions       General Comments Pt making great strides with all adls in sitting and in standing.  Pt most limited by air mattress and respiratory status.    Pertinent Vitals/ Pain          Home Living                                          Prior Functioning/Environment              Frequency  Min 2X/week        Progress Toward Goals  OT Goals(current goals can now be found in the care plan section)  Progress towards OT goals: Goals met and updated - see care plan  Acute Rehab OT Goals Patient Stated Goal: to be independent again OT Goal Formulation: With  patient Time For Goal Achievement: 01/13/22 Potential to Achieve Goals: Good ADL Goals Pt Will Perform Grooming: with supervision;standing Pt Will Perform Lower Body Dressing: with supervision;with adaptive equipment;sit to/from stand Pt Will Transfer to Toilet: with supervision;ambulating Pt Will Perform Toileting - Clothing Manipulation and hygiene: with supervision;sit to/from stand Pt Will Perform Tub/Shower Transfer: Shower transfer;with supervision;rolling walker;ambulating Additional ADL Goal #2: Pt will put sheets on bed in hospital room using walker to increased independence with home skills with supervision  Plan Discharge plan remains appropriate;Frequency remains appropriate    Co-evaluation                 AM-PAC OT "6 Clicks" Daily Activity     Outcome Measure   Help from another person eating meals?: None Help from another person taking care of personal grooming?: None Help from another person toileting, which includes using toliet, bedpan, or urinal?: A [place] Help from another person bathing (including washing, rinsing, drying)?: A [place] Help from another person to  put on and taking off regular upper body clothing?: A [place] Help from another person to put on and taking off regular lower body clothing?: A Lot 6 Click Score: 19    End of Session Equipment Utilized During Treatment: Rolling walker (2 wheels);Oxygen;Gait belt  OT Visit Diagnosis: Other abnormalities of gait and mobility (R26.89);Unsteadiness on feet (R26.81);Muscle weakness (generalized) (M62.81);Other (comment)   Activity Tolerance Patient tolerated treatment well   Patient Left in chair;with call bell/phone within reach   Nurse Communication Mobility status (can we possibly get rid of using purewick?)        Time: 0355-9741 OT Time Calculation (min): 12 min  Charges: OT General Charges $OT Visit: 1 Visit OT Treatments $Self Care/Home Management : 8-22 mins   Glenford Peers 12/31/2021, 9:20 AM

## 2021-12-31 NOTE — Progress Notes (Signed)
Physical Therapy Treatment Patient Details Name: Angel Brewer MRN: [id number] DOB: September 14, 1965 Today's Date: 12/31/2021   History of Present Illness Pt is a 56 y.o. female admitted 11/21/21 with SOB; workup for acute on chronic HF. Transfer to ICU secondary to hypercarbic respiratory failure with increased obtundation. ETT 5/15; s/p trach 5/19; trach collar 5/23. PMH includes HTN, DVT, recurrent PE on Eliquis, morbid obesity, wrist ligament reconstruction.    PT Comments    Pt received in chair, agreeable to therapy session with goal of progressing standing/gait tolerance with hallway ambulation. Pt able to perform short household distance ambulation task with prolonged standing break (~2 mins) between gait trials and did not need seated break. Pt c/o fatigue after recently working with OT but also able to perform reciprocal STS from chair, pt still needing minA at times lift assist when standing despite max cues for body mechanics and technique. Pt continues to benefit from PT services to progress toward functional mobility goals and would benefit from AIR despite lack of support as pt is highly motivated and making great progress lately, continue to recommend at least moderate intensity post-acute rehab.  Recommendations for follow up therapy are one component of a multi-disciplinary discharge planning process, led by the attending physician.  Recommendations may be updated based on patient status, additional functional criteria and insurance authorization.  Follow Up Recommendations  Skilled nursing-short term rehab (<3 hours/day) (would benefit from AIR but does not have 24/7 assist upon DC)     Assistance Recommended at Discharge Frequent or constant Supervision/Assistance  Patient can return home with the following Help with stairs or ramp for entrance;Assist for transportation;Direct supervision/assist for financial management;Assistance with cooking/housework;A Fiala help with walking  and/or transfers;A lot of help with bathing/dressing/bathroom   Equipment Recommendations  Wheelchair (measurements PT);Wheelchair cushion (measurements PT);Hospital bed;Rolling walker (2 wheels) (bariatric size)    Recommendations for Other Services       Precautions / Restrictions Precautions Precautions: Fall Precaution Comments: trach on RA, PSMV Restrictions Weight Bearing Restrictions: No     Mobility  Bed Mobility Overal bed mobility: Needs Assistance             General bed mobility comments: pt received/remained in bari recliner    Transfers Overall transfer level: Needs assistance Equipment used: Rolling walker (2 wheels) Transfers: Sit to/from Stand, Bed to chair/wheelchair/BSC Sit to Stand: Min assist, Min guard           General transfer comment: minA lift assist initial rep from chair, RN assisting to stabilize chair as brakes not locking tightly and use of momentum; pt then able to perform STS x5 reps reciprocal with minA to min guard    Ambulation/Gait Ambulation/Gait assistance: Min guard Gait Distance (Feet): 75 Feet (x2 with prolonged standing rest break) Assistive device: Rolling walker (2 wheels) (bariatric) Gait Pattern/deviations: Wide base of support, Decreased stride length Gait velocity: decreased     General Gait Details: SpO2 noisy signal, reading 88% at times on RA during ambulation, but O2 WFL 92% when taken in room after return to chair, so difficult to say if reading was accurate. HR reading 90's bpm resting and to 118 bpm during ambulation with poor signal   Stairs Stairs:  (pt defers to attempt)            Balance Overall balance assessment: Needs assistance Sitting-balance support: Feet supported Sitting balance-[name]: Fair     Standing balance support: No upper extremity supported Standing balance-[name]: Fair Standing balance comment: able  to static stand briefly between STS reps unsupported, reliant on  RW for gait progression in room              Cognition Arousal/Alertness: Awake/alert Behavior During Therapy: [facility] for tasks assessed/performed Overall Cognitive Status: Within Functional Limits for tasks assessed                  Exercises Other Exercises Other Exercises: serial STS x 5 reps using arm rests (pt defers more reps after initial 5)    General Comments        Pertinent Vitals/Pain Pain Assessment Pain Assessment: Faces Faces Pain Scale: Hurts a Griffeth bit Pain Location: B knees and low back Pain Descriptors / Indicators: Discomfort Pain Intervention(s): Monitored during session, Repositioned           PT Goals (current goals can now be found in the care plan section) Acute Rehab PT Goals Patient Stated Goal: get back to work PT Goal Formulation: With patient Time For Goal Achievement: 01/13/22 Progress towards PT goals: Progressing toward goals    Frequency    Min 3X/week      PT Plan Current plan remains appropriate       AM-PAC PT "6 Clicks" Mobility   Outcome Measure  Help needed turning from your back to your side while in a flat bed without using bedrails?: A Sam Help needed moving from lying on your back to sitting on the side of a flat bed without using bedrails?: A Lot Help needed moving to and from a bed to a chair (including a wheelchair)?: A Parsell Help needed standing up from a chair using your arms (e.g., wheelchair or bedside chair)?: A Yerian Help needed to walk in hospital room?: A [place] Help needed climbing 3-5 steps with a railing? : Total 6 Click Score: 15    End of Session Equipment Utilized During Treatment: Gait belt (flat sheet twisted into gait belt 2/2 body habitus) Activity Tolerance: Patient tolerated treatment well Patient left: in chair;with call bell/phone within reach Nurse Communication: Mobility status PT Visit Diagnosis: Unsteadiness on feet (R26.81);Muscle weakness (generalized)  (M62.81);Difficulty in walking, not elsewhere classified (R26.2) Pain - Right/Left: Right Pain - part of body: Knee     Time: 1152-1209 PT Time Calculation (min) (ACUTE ONLY): 17 min  Charges:  $Gait Training: 8-22 mins                     Feiga Nadel P., PTA Acute Rehabilitation Services Secure Chat Preferred 9a-5:30pm Office: [place] 12/31/2021, 12:53 PM

## 2021-12-31 NOTE — Progress Notes (Signed)
Progress Note  Patient: Angel Brewer NOM:[id number] DOB: 1966-06-23  DOA: 11/21/2021  DOS: 12/31/2021    Brief hospital course: Angel Brewer was admitted to the hospital with the working diagnosis of acute on chronic diastolic heart failure, complicated with acute hypoxemic respiratory failure.   Angel Brewer is a 56 y.o. female with medical history significant for HFpEF (last EF 60-65% October 2022), history of recurrent PE on Eliquis, pulmonary hypertension, HTN, HLD, OSA/OHS on CPAP, obesity class 3. Reported 2 days of worsening dyspnea, orthopnea and cough. On her initial physical examination her blood pressure was 144/83, HR 78, RR 34, 02 saturation 94% on 4 L/min per [place]. Distant breath sounds, with no wheezing, heart with S1 and S2 present and rhythmic, abdomen not distended, positive lower extremity edema.   Chest radiograph with cardiomegaly, bilateral interstitial infiltrates, congested hilar vasculature and cephalization of the vasculature.   5/11 initial admission to hospitalist service.  Started on BiPAP and diuresed successfully for 5 L. 5/13 increasing somnolence and hypercarbia new since admission. 5/15 am intubated. Discussed trach with son who wanted to give her a chance of extubation before proceeding straight to trach 5/16 failed SBT for tachypnea 5/17 failed SBT for tachypnea 5/18 failed SBT for rapid and shallow breathing despite high PS 5/19 tracheostomy placed 5/24 Placed on trach collar mid morning of 5/23 and has remained on ATC since  5/25 stable on trach collar 5/27 Transferred from PCCM to [facility] service  5/28 - Remains stable on tracheostomy managed by pulmonology awaiting transfer to facility (who will not take new trach - much mature at least 30 days per documentation). 6/12-Present - increasing activity levels - able to ambulate OOB with assistance; making good progress with PT; continues to wean oxygen daily as tolerated 6/16 -patient continues to progress with  increasing physical activity  Patient remains medically stable to be transfer to rehab. She still has cuffed trach that probably need to be changed to uncuffed before discharge. Follow up with pulmonary for further recommendations. 30 days has passed from trach placement as of 12/29/21 - medically stable and awaiting placement/bed offer/insurance approval pending.  Assessment and Plan:  Acute hypoxemic and hypercapnic respiratory failure due to acute cardiogenic pulmonary edema due to acute on chronic HFpEF and OHS: - s/p tracheostomy 5/19 > ATC 5/24 on 10L/40% FiO2, currently with #6 Shiley uncuffed, using PMV. Management per PCCM weekly and prn. Not likely to decannulate in the interim given her use of trilogy at home, body habitus and profound respiratory failure.  Acute on chronic HFpEF, HTN: Echocardiogram with preserved LV systolic function 60 to 96%, RV systolic function is preserved, no significant valvular disease.  - Restarted home lasix 25m daily. Appears roughly euvolemic s/p significant diuresis earlier in hospitalization. - Continue spironolactone and dapagliflozin.  - Labs stable.  OHS, OSA:  - Weight loss recommended, encouraged to mobilize as much as possible to avoid hypoventilation/atelectasis.   Morbid obesity: Body mass index is 64.81 kg/m.  - Given comorbidities that could be impacted by weight loss, would recommend bariatric surgery evaluation if patient amenable.   History of recurrent PE:  - Continue eliquis.  Anxiety:  - Continue clonazepam low dose scheduled  HLD:   - Continue atorvastatin   Hyponatremia: Mild, stable.   Acute metabolic encephalopathy: Resolved.  Leukocytosis: Resolved without targeted treatment.  Subjective: No acute issues or events overnight; remains in good spirits as she continues to improve  Objective: Vitals:   12/30/21 1938 12/30/21 2209 12/30/21 2336 12/31/21 [id number]  BP:  117/67    Pulse: 60 64 70 60  Resp: _0 Temp:  98.3 F (36.8 C)    TempSrc:  Oral    SpO2: 96% 93% 95% 94%  Weight:      Height:       Gen: 56 y.o. female in no distress Pulm: Nonlabored breathing thru trach with PMV. Clear. CV: Regular rate and rhythm. No murmur, rub, or gallop. No JVD, no dependent edema. GI: Abdomen soft, non-tender, non-distended, with normoactive bowel sounds.  Ext: Warm, no deformities Skin: No rashes, lesions or ulcers on visualized skin. Neuro: Alert and oriented. No focal neurological deficits. Psych: Judgement and insight appear fair. Mood euthymic & affect congruent.      Data Personally reviewed: CBC: No results for input(s): "WBC", "NEUTROABS", "HGB", "HCT", "MCV", "PLT" in the last 168 hours.  Basic Metabolic Panel: No results for input(s): "NA", "K", "CL", "CO2", "GLUCOSE", "BUN", "CREATININE", "CALCIUM", "MG", "PHOS" in the last 168 hours.  Family Communication: None at bedside  Disposition: Status is: Inpatient Remains inpatient appropriate because: Unsafe DC; medically stable Planned Discharge Destination: Pending SNF placement, likely not to occur prior to 30 days from trach placement on 5/19   Angel Ishikawa, MD 12/31/2021 7:50 AM Page by [email or web address]

## 2021-12-31 NOTE — TOC Progression Note (Signed)
Transition of Care [facility]) - Progression Note    Patient Details  Name: Angel Brewer MRN: [id number] Date of Birth: Oct 24, 1965  Transition of [facility] CM/SW [place], RN Phone Number: 12/31/2021, 3:34 PM  Clinical Narrative:    CM spoke with Tressa Busman, [place] at [facility] and they are unable to accept the patient at the facility due to her bariatric needs.  I updated the patient and will continue to explore options for [facility] placement.  Attending physician, Dr. Avon Gully and bedside nursing are aware.  CM and MSW with DTP Team will continue to follow the patient for SNF placement - no bed offers at this time.   Expected Discharge Plan: [place] Barriers to Discharge: No SNF bed, Continued Medical Work up  Expected Discharge Plan and Services Expected Discharge Plan: [place] In-house Referral: PCP / [facility] Discharge Planning Services: CM Consult Post Acute Care Choice: [place] Living arrangements for the past 2 months: [place] Expected Discharge Date: 12/30/21                 DME Agency: [employer]                   Social Determinants of Health (SDOH) Interventions Food Insecurity Interventions: Intervention Not Indicated Transportation Interventions: [facility])  Readmission Risk Interventions    12/20/2021   11:33 AM 12/02/2019    1:47 PM  Readmission Risk Prevention Plan  Post Dischage Appt  Complete  Medication Screening  Complete  Transportation Screening Complete Complete  PCP or Specialist Appt within 3-5 Days Complete   HRI or [place] Complete   Social Work Consult for [place] Planning/Counseling Complete   Palliative Care Screening Not Applicable   Medication Review Press photographer) Complete

## 2021-12-31 NOTE — Progress Notes (Signed)
Occupational Therapy Treatment Patient Details Name: Nikiya Starn MRN: [id number] DOB: 1965/12/16 Today's Date: 12/31/2021   History of present illness Pt is a 56 y.o. female admitted 11/21/21 with SOB; workup for acute on chronic HF. Transfer to ICU secondary to hypercarbic respiratory failure with increased obtundation. ETT 5/15; s/p trach 5/19; trach collar 5/23. PMH includes HTN, DVT, recurrent PE on Eliquis, morbid obesity, wrist ligament reconstruction.   OT comments  Patient seen with OTR, Jinger Neighbors, for beginning of treatment to help establish goals. Patient continues to make good progress with OT treatment. Patient able to ambulate to bathroom and stand from commode with use of rail and min assist. Patient able to stand at sink for hand hygiene. Patient performed mobility and standing tasks to improved safety and independence with functional mobility. Acute OT to continue to follow.    Recommendations for follow up therapy are one component of a multi-disciplinary discharge planning process, led by the attending physician.  Recommendations may be updated based on patient status, additional functional criteria and insurance authorization.    Follow Up Recommendations  Skilled nursing-short term rehab (<3 hours/day)    Assistance Recommended at Discharge Frequent or constant Supervision/Assistance  Patient can return home with the following  A Zaw[place]owski help with walking and/or transfers;A Doom help with bathing/dressing/bathroom;Assist for transportation;Assistance with cooking/housework   Equipment Recommendations  Other (comment) (TBD)    Recommendations for Other Services      Precautions / Restrictions Precautions Precautions: Fall Precaution Comments: trach 28% FiO2, PSMV Restrictions Weight Bearing Restrictions: No       Mobility Bed Mobility Overal bed mobility: Needs Assistance Bed Mobility: Supine to Sit Rolling: Min assist   Supine to sit: Min assist, HOB  elevated     General bed mobility comments: uses bed railm min assist due to air mattress    Transfers Overall transfer level: Needs assistance Equipment used: Rolling walker (2 wheels) Transfers: Sit to/from Stand, Bed to chair/wheelchair/BSC Sit to Stand: Min guard           General transfer comment: min guard to min assist for transfers     Balance Overall balance assessment: Needs assistance Sitting-balance support: Feet supported Sitting balance-[name]: Fair Sitting balance - Comments: assistance due to air mattress   Standing balance support: No upper extremity supported Standing balance-[name]: Fair Standing balance comment: performed reaching tasks while standing without UE support                           ADL either performed or assessed with clinical judgement   ADL Overall ADL's : Needs assistance/impaired Eating/Feeding: Independent;Sitting   Grooming: Wash/dry hands;Supervision/safety;Standing Grooming Details (indicate cue type and reason): Pt walked to sink to groom in standing. Stood at sink for appx 1 minute with supervision. Upper Body Bathing: Minimal assistance;Sitting   Lower Body Bathing: Moderate assistance;Sit to/from stand Lower Body Bathing Details (indicate cue type and reason): pt would benefit from long sponge at home to assist wtih reaching all areas due to obesity. Upper Body Dressing : Minimal assistance;Sitting   Lower Body Dressing: Moderate assistance;Sit to/from stand;Cueing for compensatory techniques Lower Body Dressing Details (indicate cue type and reason): Pt has reacher and sock aid at home which will allow pt to be much more independent. Toilet Transfer: Minimal assistance;Ambulation;Rolling walker (2 wheels);Comfort height toilet;Grab bars Toilet Transfer Details (indicate cue type and reason): Pt walked to bathroom to toilet with walker Toileting- Clothing Manipulation and Hygiene:  Min  guard;Sitting/lateral lean       Functional mobility during ADLs: Minimal assistance;Rolling walker (2 wheels) General ADL Comments: patient motivated to return to prior level of independence    Extremity/Trunk Assessment Upper Extremity Assessment Upper Extremity Assessment: Overall WFL for tasks assessed   Lower Extremity Assessment Lower Extremity Assessment: Defer to PT evaluation        Vision   Vision Assessment?: No apparent visual deficits   Perception Perception Perception: Within Functional Limits   Praxis Praxis Praxis: Intact    Cognition Arousal/Alertness: Awake/alert Behavior During Therapy: WFL for tasks assessed/performed Overall Cognitive Status: Within Functional Limits for tasks assessed                                          Exercises Exercises: General Upper Extremity General Exercises - Upper Extremity Shoulder Flexion: AROM, Both, Standing Shoulder ABduction: AROM, Both, 10 reps, Standing Chair Push Up: 10 reps    Shoulder Instructions       General Comments Pt making great strides with all adls in sitting and in standing.  Pt most limited by air mattress and respiratory status.    Pertinent Vitals/ Pain       Pain Assessment Pain Assessment: No/denies pain Pain Intervention(s): Monitored during session  Home Living                                          Prior Functioning/Environment              Frequency  Min 2X/week        Progress Toward Goals  OT Goals(current goals can now be found in the care plan section)  Progress towards OT goals: Progressing toward goals  Acute Rehab OT Goals Patient Stated Goal: return to prior level of independence OT Goal Formulation: With patient Time For Goal Achievement: 01/13/22 Potential to Achieve Goals: Good ADL Goals Pt Will Perform Grooming: with supervision;standing Pt Will Perform Upper Body Bathing: with min guard assist;with adaptive  equipment;sitting Pt Will Perform Lower Body Bathing: with mod assist;sitting/lateral leans;with adaptive equipment Pt Will Perform Lower Body Dressing: with supervision;with adaptive equipment;sit to/from stand Pt Will Transfer to Toilet: with supervision;ambulating Pt Will Perform Toileting - Clothing Manipulation and hygiene: with supervision;sit to/from stand Pt Will Perform Tub/Shower Transfer: Shower transfer;with supervision;rolling walker;ambulating Pt/caregiver will Perform Home Exercise Program: Increased strength;Both right and left upper extremity;With theraband;Independently;With written HEP provided Additional ADL Goal #1: Pt will perform bed mobility to come EOB as precursor to participation in ADL at mod A +2 Additional ADL Goal #2: Pt will put sheets on bed in hospital room using walker to increased independence with home skills with supervision  Plan Discharge plan remains appropriate;Frequency remains appropriate    Co-evaluation                 AM-PAC OT "6 Clicks" Daily Activity     Outcome Measure   Help from another person eating meals?: None Help from another person taking care of personal grooming?: None Help from another person toileting, which includes using toliet, bedpan, or urinal?: A [place] Help from another person bathing (including washing, rinsing, drying)?: A [place]ero Help from another person to put on and taking off regular upper body clothing?: A [place] Help from another person  to put on and taking off regular lower body clothing?: A Lot 6 Click Score: 19    End of Session Equipment Utilized During Treatment: Rolling walker (2 wheels);Oxygen;Gait belt  OT Visit Diagnosis: Other abnormalities of gait and mobility (R26.89);Unsteadiness on feet (R26.81);Muscle weakness (generalized) (M62.81);Other (comment)   Activity Tolerance Patient tolerated treatment well   Patient Left in chair;with call bell/phone within reach   Nurse Communication  Mobility status        Time: 3273-5302 OT Time Calculation (min): 32 min  Charges: OT General Charges $OT Visit: 1 Visit OT Treatments $Self Care/Home Management : 8-22 mins $Therapeutic Activity: 8-22 mins  Lodema Hong, [place]  Office [phone]   Trixie Dredge 12/31/2021, 10:19 AM

## 2022-01-01 DIAGNOSIS — I5033 Acute on chronic diastolic (congestive) heart failure: Secondary | ICD-10-CM | POA: Diagnosis not present

## 2022-01-01 NOTE — PMR Pre-admission (Signed)
PMR Admission Coordinator Pre-Admission Assessment   Patient: Angel Brewer is an 56 y.o., female MRN: [id number] DOB: 05/08/1966 Height: 5' 7" (170.2 cm) Weight: (!) 180.1 kg   Insurance Information HMO:     PPO:      PCP:      IPA:      80/20:      OTHER:  PRIMARY: [place] Medicaid prepaid Healthy Blue Plan      Policy#: [id number]      Subscriber:  CM Name: [place]      Phone#: [phone]     Fax#: [phone]  [place] with Blue Medicaid  called 02/06/22 with approval for 10 days 6/27-7/6 with updatesdue 7/6.  Pre-Cert#: [id number]       Employer:  Benefits:  Phone #: [phone]     Name:  Eff Date: 01/12/2020 - still active Deductible: $0 - does not have OOP Max: $0 - does not have CIR: 100% coverage SNF: 100% coverage Outpatient: $4 co-pay; limited to 27 visits/cal yr (27 remaining) Home Health: 100% coverage; limited by medical necessity review DME: 100% coverage; limited by medical necessity review Providers: in network  SECONDARY:       Policy#:      Phone#:    Financial Counselor:       Phone#:    The "Data Collection Information Summary" for patients in Inpatient Rehabilitation Facilities with attached "Privacy Act Statement-Health Care Records" was provided and verbally reviewed with: Patient   Emergency Contact Information Contact Information       Name Relation Home Work Mobile    Jackson,Tyrell Son     [phone]    Wollschlager,SHAQUANA Niece     [phone]    Mastropietro,Tonya Sister [phone]               Current Medical History  Patient Admitting Diagnosis: Heart Failure, Respiratory Failure  History of Present Illness:Angel Brewer is a 56 y.o. female with medical history significant for HFpEF (last EF 60-65% October 2022), history of recurrent PE on Eliquis, pulmonary hypertension, HTN, HLD, OSA/OHS on CPAP, morbid obesity who presented to the [facility] ED 11/21/21 for evaluation of dyspnea. On admission, Initial vitals showed BP 144/83, pulse 78, RR 34, temp 99.2  F, SPO2 92% on 4 L O2 via [place].  Per ED triage documentation, SPO2 was 60% while on room air.Labs showed WBC 5.5, hemoglobin 12.6, platelets 210,000, sodium 140, potassium 4.3, bicarb 30, BUN of 12, creatinine 0.95, serum glucose 86, BNP 139.2, troponin 5. SARS-CoV-2 and influenza PCR negative.  Pulm chest x-ray shows moderate severity increased interstitial lung markings/pulmonary edema.Patient was given IV Lasix 60 mg in the ED and the hospitalist service was consulted to admit for further evaluation and management. Pt. Was found to have workup for acute on chronic HF and was transferred to ICU secondary to hypercarbic respiratory failure with increased obtundation. Pt. Intubated 5/15 and decision was made to trach her 5/19. She was weaned to trach collar 12/03/20. Pt. Weaned to room air, donning PMV during all waking hours and independent with its use and care. PT/OT/SLP saw Pt. And recommended CIR to assist return to PLOF.    Patient's medical record from [facility]  has been reviewed by the rehabilitation admission coordinator and physician.   Past Medical History      Past Medical History:  Diagnosis Date   DVT (deep vein thrombosis) in pregnancy      RLE DVT 02/2016   Dyspnea     Morbid obesity (  HCC)     PE (pulmonary embolism) 02/29/2016   Sleep apnea      uses a cpap      Has the patient had major surgery during 100 days prior to admission? Yes   Family History   family history includes Hypertension in her sister.   Current Medications   Current Facility-Administered Medications:    0.9 %  sodium chloride infusion, 250 mL, Intravenous, Continuous, Meier, Nathaniel M, MD, Stopped at 12/04/21 1440   acetaminophen (TYLENOL) tablet 650 mg, 650 mg, Oral, Q6H PRN **OR** acetaminophen (TYLENOL) suppository 650 mg, 650 mg, Rectal, Q6H PRN, Arrien, Mauricio Daniel, MD   albuterol (PROVENTIL) (2.5 MG/3ML) 0.083% nebulizer solution 3 mL, 3 mL, Inhalation, Q4H PRN, Patel,  Vishal R, MD   apixaban (ELIQUIS) tablet 5 mg, 5 mg, Oral, BID, Arrien, Mauricio Daniel, MD, 5 mg at 01/01/22 0851   atorvastatin (LIPITOR) tablet 20 mg, 20 mg, Oral, Daily, Grunz, Ryan B, MD, 20 mg at 01/01/22 0851   clonazePAM (KLONOPIN) disintegrating tablet 0.25 mg, 0.25 mg, Oral, BID, Arrien, Mauricio Daniel, MD, 0.25 mg at 01/01/22 0851   dapagliflozin propanediol (FARXIGA) tablet 10 mg, 10 mg, Oral, Daily, Arrien, Mauricio Daniel, MD, 10 mg at 01/01/22 0851   dicyclomine (BENTYL) capsule 10 mg, 10 mg, Oral, BID PRN, Joseph, Preetha, MD, 10 mg at 12/10/21 1314   feeding supplement (ENSURE ENLIVE / ENSURE PLUS) liquid 237 mL, 237 mL, Oral, BID BM, Smith, Daniel C, MD, 237 mL at 12/26/21 1506   furosemide (LASIX) tablet 40 mg, 40 mg, Oral, Daily, Grunz, Ryan B, MD, 40 mg at 01/01/22 0851   lip balm (CARMEX) ointment, , Topical, PRN, Desai, Nikita S, MD   MEDLINE mouth rinse, 15 mL, Mouth Rinse, q12n4p, Smith, Daniel C, MD, 15 mL at 01/01/22 1231   multivitamin with minerals tablet 1 tablet, 1 tablet, Oral, Daily, Arrien, Mauricio Daniel, MD, 1 tablet at 12/25/21 0934   ondansetron (ZOFRAN) tablet 4 mg, 4 mg, Oral, Q6H PRN, 4 mg at 12/23/21 1423 **OR** ondansetron (ZOFRAN) injection 4 mg, 4 mg, Intravenous, Q6H PRN, Arrien, Mauricio Daniel, MD, 4 mg at 12/13/21 0115   oxyCODONE (Oxy IR/ROXICODONE) immediate release tablet 5 mg, 5 mg, Oral, Q6H PRN, Arrien, Mauricio Daniel, MD   pantoprazole (PROTONIX) EC tablet 40 mg, 40 mg, Oral, Daily, Arrien, Mauricio Daniel, MD, 40 mg at 01/01/22 0851   polyethylene glycol (MIRALAX / GLYCOLAX) packet 17 g, 17 g, Oral, Daily PRN, Arrien, Mauricio Daniel, MD   senna-docusate (Senokot-S) tablet 1 tablet, 1 tablet, Oral, BID, Arrien, Mauricio Daniel, MD, 1 tablet at 12/25/21 0934   simethicone (MYLICON) chewable tablet 80 mg, 80 mg, Oral, QID PRN, Opyd, Timothy S, MD, 80 mg at 12/15/21 0216   spironolactone (ALDACTONE) tablet 25 mg, 25 mg, Oral, Daily, Grunz,  Ryan B, MD, 25 mg at 01/01/22 0851   Patients Current Diet:  Diet Order                  Diet regular Room service appropriate? Yes; Fluid consistency: Thin  Diet effective now                         Precautions / Restrictions Precautions Precautions: Fall Precaution Comments: trach on RA, PMSV Restrictions Weight Bearing Restrictions: No    Has the patient had 2 or more falls or a fall with injury in the past year? Yes   Prior Activity Level Community (5-7x/wk): Pt working and   active in the community PTA   Prior Functional Level Self Care: Did the patient need help bathing, dressing, using the toilet or eating? Independent   Indoor Mobility: Did the patient need assistance with walking from room to room (with or without device)? Independent   Stairs: Did the patient need assistance with internal or external stairs (with or without device)? Independent   Functional Cognition: Did the patient need help planning regular tasks such as shopping or remembering to take medications? Independent   Patient Information Are you of Hispanic, Latino/a,or Spanish origin?: A. No, not of Hispanic, Latino/a, or Spanish origin What is your race?: B. Black or African American Do you need or want an interpreter to communicate with a doctor or health care staff?: 0. No   Patient's Response To:  Health Literacy and Transportation Is the patient able to respond to health literacy and transportation needs?: Yes Health Literacy - How often do you need to have someone help you when you read instructions, pamphlets, or other written material from your doctor or pharmacy?: Never In the past 12 months, has lack of transportation kept you from medical appointments or from getting medications?: No In the past 12 months, has lack of transportation kept you from meetings, work, or from getting things needed for daily living?: No   Home Assistive Devices / Equipment Home Assistive Devices/Equipment:  Dentures (specify type), CPAP (Upper & lower dentures) Home Equipment: Rolling Walker (2 wheels), BSC/3in1, Shower seat   Prior Device Use: Indicate devices/aids used by the patient prior to current illness, exacerbation or injury? None of the above   Current Functional Level Cognition   Overall Cognitive Status: Within Functional Limits for tasks assessed Difficult to assess due to: Tracheostomy Orientation Level: Oriented X4 General Comments: Motivated with great participation, pt reports she is eager to DC and a bit frustrated with placement delays due to her weight.    Extremity Assessment (includes Sensation/Coordination)   Upper Extremity Assessment: Overall WFL for tasks assessed RUE Deficits / Details: grasp 4/5, elbow AROM WFL, shoulder AROM WFL limited by lines - able to bring grooming items to face RUE Sensation: WNL LUE Deficits / Details: grasp 4/5, elbow AROM, shoulder AROM WFL limited lines LUE Sensation: WNL  Lower Extremity Assessment: Defer to PT evaluation RLE Deficits / Details: limited ankle AROM DF, limited knee flexion AAROM due to stiffness, good hip abduction AROM RLE Sensation: WNL LLE Deficits / Details: limited ankle AROM DF, limited knee flexion AAROM due to stiffness, good hip abduction AROM LLE Sensation: WNL     ADLs   Overall ADL's : Needs assistance/impaired Eating/Feeding: Independent, Sitting Eating/Feeding Details (indicate cue type and reason): able to take small sips of water Grooming: Wash/dry hands, Supervision/safety, Standing Grooming Details (indicate cue type and reason): Pt walked to sink to groom in standing. Stood at sink for appx 1 minute with supervision. Upper Body Bathing: Minimal assistance, Sitting Upper Body Bathing Details (indicate cue type and reason): pt able to participate in front, max A for back Lower Body Bathing: Moderate assistance, Sit to/from stand Lower Body Bathing Details (indicate cue type and reason): pt would  benefit from long sponge at home to assist wtih reaching all areas due to obesity. Upper Body Dressing : Minimal assistance, Sitting Upper Body Dressing Details (indicate cue type and reason): changed gown Lower Body Dressing: Moderate assistance, Sit to/from stand, Cueing for compensatory techniques Lower Body Dressing Details (indicate cue type and reason): Pt has reacher and sock aid at   home which will allow pt to be much more independent. Toilet Transfer: Minimal assistance, Ambulation, Rolling walker (2 wheels), Comfort height toilet, Grab bars Toilet Transfer Details (indicate cue type and reason): required assistance to stand from toilet Toileting- Clothing Manipulation and Hygiene: Min guard, Sitting/lateral lean Functional mobility during ADLs: Minimal assistance, Rolling walker (2 wheels) General ADL Comments: able to sit on toilet with min guard and min assist to stand     Mobility   Overal bed mobility: Needs Assistance Bed Mobility: Supine to Sit Rolling: Min assist Supine to sit: Min assist, HOB elevated Sit to supine: Min assist General bed mobility comments: in recliner up entry     Transfers   Overall transfer level: Needs assistance Equipment used: Rolling walker (2 wheels) Transfers: Sit to/from Stand, Bed to chair/wheelchair/BSC Sit to Stand: Min assist, Min guard Bed to/from chair/wheelchair/BSC transfer type:: Stand pivot Stand pivot transfers: Min assist Step pivot transfers: Min guard Transfer via Lift Equipment: Maximove General transfer comment: min assist to min guard for sit to stands from chair and toilet     Ambulation / Gait / Stairs / Wheelchair Mobility   Ambulation/Gait Ambulation/Gait assistance: Min guard Gait Distance (Feet): 200 Feet (including x2 standing breaks) Assistive device: Rolling walker (2 wheels) (bariatric) Gait Pattern/deviations: Wide base of support, Decreased stride length General Gait Details: SpO2 noisy signal, pt not  dyspneic with exertion. O2 WFL 92% when taken in room after return to chair, HR reading high 90's bpm after gait trial in room but does increase to 130 bpm with STS x 10 reps in a row Gait velocity: decreased Gait velocity interpretation: <1.31 ft/sec, indicative of household ambulator Pre-gait activities: standing hip flexion x10 reps (limited due to pt loose stools) Stairs:  (pt stepping up/down onto 1" platform for standing weight x3 reps w/min guard to minA, plan to trial curb height step next session)     Posture / Balance Dynamic Sitting Balance Sitting balance - Comments: assistance due to air mattress Balance Overall balance assessment: Needs assistance Sitting-balance support: Feet supported Sitting balance-[name]: Fair Sitting balance - Comments: assistance due to air mattress Postural control: Posterior lean Standing balance support: No upper extremity supported Standing balance-[name]: Fair Standing balance comment: able to static stand while addressing bed making goal     Special needs/care consideration Trach size Shiley 6 cuffless and Skin abrasion to left leg and erythema on groin    Previous Home Environment (from acute therapy documentation) Living Arrangements: Alone  Lives With: Alone Available Help at Discharge: Family, Available PRN/intermittently, Other (Comment) (son and niece) Type of Home: House Home Layout: Able to live on main level with bedroom/bathroom Home Access: Stairs to enter Entrance Stairs-Number of Steps: 1 Bathroom Shower/Tub: Walk-in shower Bathroom Toilet: Handicapped height Bathroom Accessibility: Yes Home Care Services: No Additional Comments: reports a son and niece can assist at d.c   Discharge Living Setting Plans for Discharge Living Setting: Patient's home Type of Home at Discharge: House Discharge Home Layout: Able to live on main level with bedroom/bathroom Discharge Home Access: Stairs to enter Entrance Stairs-Rails:  None Entrance Stairs-Number of Steps: 1 Discharge Bathroom Shower/Tub: Walk-in shower Discharge Bathroom Toilet: Handicapped height Discharge Bathroom Accessibility: Yes How Accessible: Accessible via walker Does the patient have any problems obtaining your medications?: No   Social/Family/Support Systems Ability/Limitations of Caregiver: pt. reports neice can assist PRN   Goals Patient/Family Goal for Rehab: PT/OT mod I Expected length of stay: 5-7 days Pt/Family Agrees to Admission   and willing to participate: Yes Program Orientation Provided & Reviewed with Pt/Caregiver Including Roles  & Responsibilities: Yes   Decrease burden of Care through IP rehab admission: Specialzed equipment needs, Decrease number of caregivers, Bowel and bladder program, and Patient/family education   Possible need for SNF placement upon discharge: not anticipated   Patient Condition: I have reviewed medical records from [facility], spoken with CM, and patient. I met with patient at the bedside for inpatient rehabilitation assessment.  Patient will benefit from ongoing PT and OT, can actively participate in 3 hours of therapy a day 5 days of the week, and can make measurable gains during the admission.  Patient will also benefit from the coordinated team approach during an Inpatient Acute Rehabilitation admission.  The patient will receive intensive therapy as well as Rehabilitation physician, nursing, social worker, and care management interventions.  Due to safety, disease management, medication administration, and patient education the patient requires 24 hour a day rehabilitation nursing.  The patient is currently min A-min guard with mobility and basic ADLs.  Discharge setting and therapy post discharge at home with home health is anticipated.  Patient has agreed to participate in the Acute Inpatient Rehabilitation Program and will admit today.   Preadmission Screen Completed By:  Laura B  Staley, 01/01/2022 2:37 PM ______________________________________________________________________   Discussed status with Dr. Shonia Skilling on 01/08/22 at 930 and received approval for admission today.   Admission Coordinator:  Laura B Staley, CCC-SLP, time 951/Date 01/08/22    Assessment/Plan: Diagnosis: acute on chronic hypoxic/hypercapnic respiratory failure Does the need for close, 24 hr/day Medical supervision in concert with the patient's rehab needs make it unreasonable for this patient to be served in a less intensive setting? Yes Co-Morbidities requiring supervision/potential complications:Obesity, OSA, Anxiety, HLD,  Due to bladder management, bowel management, safety, skin/wound care, disease management, medication administration, pain management, and patient education, does the patient require 24 hr/day rehab nursing? Yes Does the patient require coordinated care of a physician, rehab nurse, PT, OT, and SLP to address physical and functional deficits in the context of the above medical diagnosis(es)? Yes Addressing deficits in the following areas: balance, endurance, locomotion, strength, transferring, bowel/bladder control, bathing, dressing, feeding, grooming, and toileting Can the patient actively participate in an intensive therapy program of at least 3 hrs of therapy 5 days a week? Yes The potential for patient to make measurable gains while on inpatient rehab is excellent Anticipated functional outcomes upon discharge from inpatient rehab: modified independent PT, modified independent OT, n/a SLP Estimated rehab length of stay to reach the above functional goals is: 5-7   Anticipated discharge destination: Home 10. Overall Rehab/Functional Prognosis: good     MD Signature: Jamont Mellin 

## 2022-01-01 NOTE — Progress Notes (Cosign Needed)
Occupational Therapy Treatment Patient Details Name: Angel Brewer MRN: [id number] DOB: 1966-07-14 Today's Date: 01/01/2022   History of present illness Pt is a 56 y.o. female admitted 11/21/21 with SOB; workup for acute on chronic HF. Transfer to ICU secondary to hypercarbic respiratory failure with increased obtundation. ETT 5/15; s/p trach 5/19; trach collar 5/23. PMH includes HTN, DVT, recurrent PE on Eliquis, morbid obesity, wrist ligament reconstruction.   OT comments  Patient received in recliner and agreeable to OT session. Patient was min assist to stand from recliner and ambulated to bathroom for toilet transfer with min guard to lower to toilet and min assist to stand. Patient returned to recliner and stood with min guard to perform mobility in room and address bed making goal with min assist from RW level and bed raised. Discussed barriers to returning home and patient stated getting in and out of bed, getting off toilet at home due to no rail in bathroom, and increasing activity tolerance and balance. Patient would benefit from further therapy in AIR setting and would be able to tolerate 3 hours of therapy a day to ensure safe discharge home.     Recommendations for follow up therapy are one component of a multi-disciplinary discharge planning process, led by the attending physician.  Recommendations may be updated based on patient status, additional functional criteria and insurance authorization.    Follow Up Recommendations  Acute inpatient rehab (3hours/day)    Assistance Recommended at Discharge Intermittent Supervision/Assistance  Patient can return home with the following  A White help with walking and/or transfers;A [place] help with bathing/dressing/bathroom;Assist for transportation;Assistance with cooking/housework   Equipment Recommendations  Other (comment) (TBD)    Recommendations for Other Services      Precautions / Restrictions Precautions Precautions:  Fall Precaution Comments: trach on RA, PMSV Restrictions Weight Bearing Restrictions: No       Mobility Bed Mobility Overal bed mobility: Needs Assistance             General bed mobility comments: in recliner up entry    Transfers Overall transfer level: Needs assistance Equipment used: Rolling walker (2 wheels) Transfers: Sit to/from Stand, Bed to chair/wheelchair/BSC Sit to Stand: Min assist, Min guard           General transfer comment: min assist to min guard for sit to stands from chair and toilet     Balance Overall balance assessment: Needs assistance Sitting-balance support: Feet supported Sitting balance-[name]: Fair     Standing balance support: No upper extremity supported Standing balance-[name]: Fair Standing balance comment: able to static stand while addressing bed making goal                           ADL either performed or assessed with clinical judgement   ADL Overall ADL's : Needs assistance/impaired                         Toilet Transfer: Minimal assistance;Ambulation;Rolling walker (2 wheels);Comfort height toilet;Grab bars Toilet Transfer Details (indicate cue type and reason): required assistance to stand from toilet Toileting- Clothing Manipulation and Hygiene: Min guard;Sitting/lateral lean         General ADL Comments: able to sit on toilet with min guard and min assist to stand    Extremity/Trunk Assessment              Vision       Perception  Praxis      Cognition Arousal/Alertness: Awake/alert Behavior During Therapy: WFL for tasks assessed/performed Overall Cognitive Status: Within Functional Limits for tasks assessed                                 General Comments: Motivated with great participation, pt reports she is eager to DC and a bit frustrated with placement delays due to her weight.        Exercises      Shoulder Instructions       General  Comments HR 76-98 bpm seated pre/post exertion; HR to 130 bpm with STS x 10 reps in a row, SpO2 92% and above on RA    Pertinent Vitals/ Pain       Pain Assessment Pain Assessment: Faces Faces Pain Scale: Hurts a [place] bit Pain Location: B knees with ambulation Pain Descriptors / Indicators: Discomfort Pain Intervention(s): Monitored during session  Home Living                                          Prior Functioning/Environment              Frequency  Min 2X/week        Progress Toward Goals  OT Goals(current goals can now be found in the care plan section)  Progress towards OT goals: Progressing toward goals  Acute Rehab OT Goals Patient Stated Goal: go home OT Goal Formulation: With patient Time For Goal Achievement: 01/13/22 Potential to Achieve Goals: Good ADL Goals Pt Will Perform Grooming: with supervision Pt Will Perform Upper Body Bathing: with min guard assist;with adaptive equipment;sitting Pt Will Perform Lower Body Bathing: with mod assist;sitting/lateral leans;with adaptive equipment Pt Will Perform Lower Body Dressing: with supervision;with adaptive equipment;sit to/from stand Pt Will Transfer to Toilet: with supervision;ambulating Pt Will Perform Toileting - Clothing Manipulation and hygiene: with supervision;sit to/from stand Pt Will Perform Tub/Shower Transfer: Shower transfer;with supervision;rolling walker;ambulating Pt/caregiver will Perform Home Exercise Program: Increased strength;Both right and left upper extremity;With theraband;Independently;With written HEP provided Additional ADL Goal #1: Pt will perform bed mobility to come EOB as precursor to participation in ADL at mod A +2 Additional ADL Goal #2: Pt will put sheets on bed in hospital room using walker to increased independence with home skills with supervision  Plan Discharge plan remains appropriate;Frequency remains appropriate    Co-evaluation                  AM-PAC OT "6 Clicks" Daily Activity     Outcome Measure   Help from another person eating meals?: None Help from another person taking care of personal grooming?: None Help from another person toileting, which includes using toliet, bedpan, or urinal?: A [place] Help from another person bathing (including washing, rinsing, drying)?: A [place] Help from another person to put on and taking off regular upper body clothing?: A Cid Help from another person to put on and taking off regular lower body clothing?: A Lot 6 Click Score: 19    End of Session Equipment Utilized During Treatment: Rolling walker (2 wheels)  OT Visit Diagnosis: Other abnormalities of gait and mobility (R26.89);Unsteadiness on feet (R26.81);Muscle weakness (generalized) (M62.81);Other (comment)   Activity Tolerance Patient tolerated treatment well   Patient Left in chair;with call bell/phone within reach   Nurse Communication Mobility status  Time: 3736-6815 OT Time Calculation (min): 25 min  Charges: OT General Charges $OT Visit: 1 Visit OT Treatments $Self Care/Home Management : 23-37 mins  Lodema Hong, [place]  Office [place] 01/01/2022, 2:25 PM

## 2022-01-01 NOTE — Progress Notes (Signed)
Nutrition Brief Note  Patient remains on a regular diet. She is consuming 100% of meals.  She is no longer drinking Ensure supplements, which is appropriate, given adequate intake of meals. RD to discontinue.  TOC team is working on SNF placement. Nutrition Dx: inadequate oral intake r/t inability to eat as evidenced by NPO status; resolved. Nutrition Goal: patient will meet greater than or equal to 90% of their needs; met.  RD will sign off as nutrition goal is being met. No further nutrition interventions indicated at this time.  Lucas Mallow RD, LDN, CNSC Please refer to Amion for contact information.

## 2022-01-01 NOTE — Progress Notes (Signed)
Inpatient Rehab Admissions Coordinator:   Per PT request,  patient was screened for CIR candidacy by Clemens Catholic, MS, CCC-SLP. At this time, Pt. Appears to be a a potential candidate for CIR. I will place   order for rehab consult per protocol for full assessment. Please contact me any with questions.  Clemens Catholic, [place], [place] Admissions Coordinator  [phone] ([place]) [phone] (office)

## 2022-01-01 NOTE — Progress Notes (Signed)
Progress Note  Patient: Angel Brewer [id number]:[id number] DOB: December 26, 1965  DOA: 11/21/2021  DOS: 01/01/2022    Brief hospital course: Angel Brewer was admitted to the hospital with the working diagnosis of acute on chronic diastolic heart failure, complicated with acute hypoxemic respiratory failure.   Angel Brewer is a 56 y.o. female with medical history significant for HFpEF (last EF 60-65% October 2022), history of recurrent PE on Eliquis, pulmonary hypertension, HTN, HLD, OSA/OHS on CPAP, obesity class 3. Reported 2 days of worsening dyspnea, orthopnea and cough. On her initial physical examination her blood pressure was 144/83, HR 78, RR 34, 02 saturation 94% on 4 L/min per [place]. Distant breath sounds, with no wheezing, heart with S1 and S2 present and rhythmic, abdomen not distended, positive lower extremity edema.   Chest radiograph with cardiomegaly, bilateral interstitial infiltrates, congested hilar vasculature and cephalization of the vasculature.   5/11 initial admission to hospitalist service.  Started on BiPAP and diuresed successfully for 5 L. 5/13 increasing somnolence and hypercarbia new since admission. 5/15 am intubated. Discussed trach with son who wanted to give her a chance of extubation before proceeding straight to trach 5/16 failed SBT for tachypnea 5/17 failed SBT for tachypnea 5/18 failed SBT for rapid and shallow breathing despite high PS 5/19 tracheostomy placed 5/24 Placed on trach collar mid morning of 5/23 and has remained on ATC since  5/25 stable on trach collar 5/27 Transferred from PCCM to [facility] service  5/28 - Remains stable on tracheostomy managed by pulmonology awaiting transfer to facility (who will not take new trach - much mature at least 30 days per documentation). 6/12-Present - increasing activity levels - able to ambulate OOB with assistance; making good progress with PT; continues to wean oxygen daily as tolerated 6/16 -patient continues to progress with  increasing physical activity  Patient remains medically stable to be transfer to rehab. She still has cuffed trach that probably need to be changed to uncuffed before discharge. Follow up with pulmonary for further recommendations. 30 days has passed from trach placement as of 12/29/21 - medically stable and awaiting placement - bed offer/insurance approval pending.  Assessment and Plan:  Acute hypoxemic and hypercapnic respiratory failure due to acute cardiogenic pulmonary edema due to acute on chronic HFpEF and OHS: - s/p tracheostomy 5/19 > ATC 5/24 - Continue trach care per PCCM weekly and prn. Not likely to decannulate in the interim given her use of trilogy at home, body habitus and profound respiratory failure. - Patient has been able to wean oxygen progressively over the past week  Acute on chronic HFpEF, HTN: Echocardiogram with preserved LV systolic function 60 to 68%, RV systolic function is preserved, no significant valvular disease.  - Continue lasix 105m daily. Appears roughly euvolemic s/p significant diuresis earlier in hospitalization. - Continue spironolactone and dapagliflozin.  - Labs stable - no longer following daily  OHS, OSA:  - Weight loss recommended, encouraged to mobilize as much as possible to avoid hypoventilation/atelectasis.   Morbid obesity: Body mass index is 64.81 kg/m.  - Given comorbidities that could be impacted by weight loss, would recommend bariatric surgery evaluation if patient amenable.   History of recurrent PE:  - Continue eliquis.  Anxiety:  - Continue clonazepam low dose scheduled  HLD:   - Continue atorvastatin   Hyponatremia: Mild, stable.   Acute metabolic encephalopathy: Resolved.  Leukocytosis: Resolved without targeted treatment.  Subjective: No acute issues or events overnight; remains in good spirits as she continues to improve  Objective: Vitals:   12/31/21 1515 12/31/21 1921 12/31/21 2002 12/31/21 2349  BP:   98/62    Pulse: 79 76 73   Resp:  16    Temp:   98.1 F (36.7 C)   TempSrc:   Oral   SpO2: 91% 92% 91% 93%  Weight:      Height:       Gen: 56 y.o. female in no distress Pulm: Nonlabored breathing thru trach with PMV. Clear. CV: Regular rate and rhythm. No murmur, rub, or gallop. No JVD, no dependent edema. GI: Abdomen soft, non-tender, non-distended, with normoactive bowel sounds.  Ext: Warm, no deformities Skin: No rashes, lesions or ulcers on visualized skin. Neuro: Alert and oriented. No focal neurological deficits. Psych: Judgement and insight appear fair. Mood euthymic & affect congruent.      Data Personally reviewed: CBC: No results for input(s): "WBC", "NEUTROABS", "HGB", "HCT", "MCV", "PLT" in the last 168 hours.  Basic Metabolic Panel: No results for input(s): "NA", "K", "CL", "CO2", "GLUCOSE", "BUN", "CREATININE", "CALCIUM", "MG", "PHOS" in the last 168 hours.  Family Communication: None at bedside  Disposition: Status is: Inpatient Remains inpatient appropriate because: Unsafe DC; medically stable Planned Discharge Destination: Pending SNF placement, likely not to occur prior to 30 days from trach placement on 5/19   Angel Ishikawa, MD 01/01/2022 6:12 AM Page by [email or web address]

## 2022-01-01 NOTE — TOC Progression Note (Signed)
Transition of Care [facility]) - Progression Note    Patient Details  Name: Angel Brewer MRN: [id number] Date of Birth: Mar 09, 1966  Transition of Care [facility]) CM/SW [place], RN Phone Number: 01/01/2022, 1:13 PM  Clinical Narrative:    CM called and spoke with [place], CM with [facility] and they are unfortunately unable to offer a bed to the patient due to her Bariatric needs > 350 lbs.  Consult was placed for CIR evaluation again at suggestion of Carly, PT.  CM and MSW with DTP Team will continue to follow the patient for [facility] needs and discharge planning - No SNF beds available at this time.   Expected Discharge Plan: [place] Barriers to Discharge: No SNF bed, Continued Medical Work up  Expected Discharge Plan and Services Expected Discharge Plan: [place] In-house Referral: PCP / Health Connect Discharge Planning Services: CM Consult Post Acute Care Choice: [place] Living arrangements for the past 2 months: [place] Expected Discharge Date: 12/30/21                 DME Agency: [facility] [employer]                   Social Determinants of Health (SDOH) Interventions Food Insecurity Interventions: Intervention Not Indicated Transportation Interventions: PTAR [facility])  Readmission Risk Interventions    12/20/2021   11:33 AM 12/02/2019    1:47 PM  Readmission Risk Prevention Plan  Post Dischage Appt  Complete  Medication Screening  Complete  Transportation Screening Complete Complete  PCP or Specialist Appt within 3-5 Days Complete   HRI or [place] Complete   Social Work Consult for [place] Planning/Counseling Complete   Palliative Care Screening Not Applicable   Medication Review Press photographer) Complete

## 2022-01-01 NOTE — TOC Progression Note (Signed)
Transition of Care [facility]) - Progression Note    Patient Details  Name: Angel Brewer MRN: [id number] Date of Birth: 06-Feb-1966  Transition of Care [facility]) CM/SW Contact  Curlene Labrum, RN Phone Number: 01/01/2022, 8:32 AM  Clinical Narrative:    CM and MSW with DTP Team will continue to explore options for [facility] placement for this patient - barriers include Managed Medicaid, Trach, and bariatric needs requiring care for > 350 lbs.  I called and spoke with [facility] and they are unable to meet bariatric needs for this patient and have declined a bed offer.  I called and spoke with Rob, CM with [facility] Team and they are unable to providing private duty nursing in the home since the patient does not have a caregiver in the home.  I called and spoke with [name], Admissions director with [facility] [place] and they are unable to accommodate care needs for patients with bariatric needs >350 lbs.  Winnifred Friar, CM with [facility] will not be in network with the patient's provider until after 01/25/22 - no beds available.  CM and MSW with DTP Team will continue to explore options for safe discharge plan for the patient - no beds offers at this time for SNF placement.  Expected Discharge Plan: [place] Barriers to Discharge: No SNF bed, Continued Medical Work up  Expected Discharge Plan and Services Expected Discharge Plan: [place] In-house Referral: PCP / Health Connect Discharge Planning Services: CM Consult Post Acute Care Choice: [place] Living arrangements for the past 2 months: [place] Expected Discharge Date: 12/30/21                 DME Agency: [employer]                   Social Determinants of Health (SDOH) Interventions Food Insecurity Interventions: Intervention Not Indicated Transportation Interventions: PTAR [facility])  Readmission Risk Interventions    12/20/2021   11:33 AM 12/02/2019    1:47 PM  Readmission Risk Prevention Plan  Post Dischage Appt  Complete  Medication Screening  Complete  Transportation Screening Complete Complete  PCP or Specialist Appt within 3-5 Days Complete   HRI or [place] Complete   Social Work Consult for [place] Planning/Counseling Complete   Palliative Care Screening Not Applicable   Medication Review Press photographer) Complete

## 2022-01-01 NOTE — Progress Notes (Signed)
Inpatient Rehab Admissions Coordinator:    I met with pt. To discuss potential CIR admit. She is interested. Says family can check on her but she will mostly be alone. Pt. States that she will learn to do her own trach care and that she has not been requiring suctioning and can manager her own PMV already. I requested that OT update their d/c recommendation, as I cannot submit for insurance auth until PT and OT both rec CIR.   Clemens Catholic, [place], [place] Admissions Coordinator  [phone] ([place]) [phone] (office)

## 2022-01-01 NOTE — Progress Notes (Addendum)
Physical Therapy Treatment Patient Details Name: Angel Brewer MRN: [id number] DOB: June 10, 1966 Today's Date: 01/01/2022   History of Present Illness Pt is a 56 y.o. female admitted 11/21/21 with SOB; workup for acute on chronic HF. Transfer to ICU secondary to hypercarbic respiratory failure with increased obtundation. ETT 5/15; s/p trach 5/19; trach collar 5/23. PMH includes HTN, DVT, recurrent PE on Eliquis, morbid obesity, wrist ligament reconstruction.    PT Comments    Pt received in recliner, agreeable to therapy session and with excellent participation and tolerance for gait progression in hallway and transfer training. Pt able to perform transfers with up to minA and gait with min guard and cues for activity pacing. Pt performed step-ups onto scale (1" height) x3 reps with up to minA, plan to bring curb height step next session for instruction/strengthening. Pt reports only 2/10 modified RPE (fatigue) at end of session and was eager to ambulate again with mobility or nursing staff later in day, mobility and nursing teams notified. Disposition updated per discussion with supervising PT Jaclyn R and patient agreeable. Pt continues to benefit from PT services to progress toward functional mobility goals.   Recommendations for follow up therapy are one component of a multi-disciplinary discharge planning process, led by the attending physician.  Recommendations may be updated based on patient status, additional functional criteria and insurance authorization.  Follow Up Recommendations  Acute inpatient rehab (3hours/day)     Assistance Recommended at Discharge Frequent or constant Supervision/Assistance  Patient can return home with the following Help with stairs or ramp for entrance;Assist for transportation;Direct supervision/assist for financial management;Assistance with cooking/housework;A [place] help with walking and/or transfers;A lot of help with bathing/dressing/bathroom   Equipment  Recommendations  Rolling walker (2 wheels) (bariatric size, may consider 3MI pending progress)    Recommendations for Other Services Rehab consult     Precautions / Restrictions Precautions Precautions: Fall Precaution Comments: trach on RA, PMSV Restrictions Weight Bearing Restrictions: No     Mobility  Bed Mobility Overal bed mobility: Needs Assistance             General bed mobility comments: pt received/remained in bari recliner, per RN pt needing +1 assist to perform    Transfers Overall transfer level: Needs assistance Equipment used: Rolling walker (2 wheels) Transfers: Sit to/from Stand, Bed to chair/wheelchair/BSC Sit to Stand: Min assist, Min guard           General transfer comment: minA lift assist initial rep from chair. After gait trial, pt able to perform STS x10 reps reciprocal with mostly min guard (only intermittent minimal lift assist)    Ambulation/Gait Ambulation/Gait assistance: Min guard Gait Distance (Feet): 200 Feet (including x2 standing breaks) Assistive device: Rolling walker (2 wheels) (bariatric) Gait Pattern/deviations: Wide base of support, Decreased stride length       General Gait Details: SpO2 noisy signal, pt not dyspneic with exertion. O2 WFL 92% when taken in room after return to chair, HR reading high 90's bpm after gait trial in room but does increase to 130 bpm with STS x 10 reps in a row   Stairs Stairs:  (pt stepping up/down onto 1" platform for standing weight x3 reps w/min guard to minA, plan to trial curb height step next session)             Balance Overall balance assessment: Needs assistance Sitting-balance support: Feet supported Sitting balance-[name]: Fair     Standing balance support: No upper extremity supported Standing balance-[name]: [place] Standing  balance comment: able to static stand briefly w/MGA between STS reps unsupported, reliant on RW for gait progression                             Cognition Arousal/Alertness: Awake/alert Behavior During Therapy: WFL for tasks assessed/performed Overall Cognitive Status: Within Functional Limits for tasks assessed                                 General Comments: Motivated with great participation, pt reports she is eager to DC and a bit frustrated with placement delays due to her weight.        Exercises Other Exercises Other Exercises: serial STS x 10 reps using arm rests Other Exercises: step-ups x3 onto 1" platform for strengthening (scale height) Other Exercises: reviewed supine/seated LE therex using red band including hip ab/adduction and UE therex, pt reports she hasn't been using band since bed changed from air bed but is agreeable to try later in day.    General Comments General comments (skin integrity, edema, etc.): HR 76-98 bpm seated pre/post exertion; HR to 130 bpm with STS x 10 reps in a row, SpO2 92% and above on RA      Pertinent Vitals/Pain Pain Assessment Pain Assessment: 0-10 Faces Pain Scale: Hurts a [place] bit Pain Location: B knees with ambulation Pain Descriptors / Indicators: Discomfort Pain Intervention(s): Monitored during session    Home Living   Prior Function    PT Goals (current goals can now be found in the care plan section) Acute Rehab PT Goals Patient Stated Goal: to go home and get back to work PT Goal Formulation: With patient Time For Goal Achievement: 01/13/22 Progress towards PT goals: Progressing toward goals    Frequency    Min 3X/week (would benefit from more)      PT Plan Discharge plan needs to be updated       AM-PAC PT "6 Clicks" Mobility   Outcome Measure  Help needed turning from your back to your side while in a flat bed without using bedrails?: A [place] Help needed moving from lying on your back to sitting on the side of a flat bed without using bedrails?: A [place] Help needed moving to and from a bed to a chair (including a  wheelchair)?: A [place] Help needed standing up from a chair using your arms (e.g., wheelchair or bedside chair)?: A [place] Help needed to walk in hospital room?: A [place] Help needed climbing 3-5 steps with a railing? : Total 6 Click Score: 15    End of Session Equipment Utilized During Treatment: Gait belt (flat sheet twisted into gait belt 2/2 body habitus) Activity Tolerance: Patient tolerated treatment well Patient left: in chair;with call bell/phone within reach;Other (comment) (pt reclined in chair) Nurse Communication: Mobility status PT Visit Diagnosis: Unsteadiness on feet (R26.81);Muscle weakness (generalized) (M62.81);Difficulty in walking, not elsewhere classified (R26.2) Pain - Right/Left: Right Pain - part of body: Knee     Time: 7628-3151 PT Time Calculation (min) (ACUTE ONLY): 35 min  Charges:  $Gait Training: 8-22 mins $Therapeutic Exercise: 8-22 mins                     Tatym Schermer P., PTA Acute Rehabilitation Services Secure Chat Preferred 9a-5:30pm Office: [place] 01/01/2022, 1:52 PM

## 2022-01-02 DIAGNOSIS — I5033 Acute on chronic diastolic (congestive) heart failure: Secondary | ICD-10-CM | POA: Diagnosis not present

## 2022-01-02 NOTE — Progress Notes (Signed)
IP rehab admissions - We have received a denial from insurance carrier for acute inpatient rehab.  The reason is that patient needs can be met at a lower level of care.  A peer to peer can be done by attending MD calling [phone] X [phone] to discuss case.  Call me for questions.  [phone]

## 2022-01-02 NOTE — Progress Notes (Signed)
Progress Note  Patient: Angel Brewer ZOX:[id number] DOB: Apr 19, 1966  DOA: 11/21/2021  DOS: 01/02/2022    Brief hospital course: Mrs. Coppock was admitted to the hospital with the working diagnosis of acute on chronic diastolic heart failure, complicated with acute hypoxemic respiratory failure.   Angel Brewer is a 56 y.o. female with medical history significant for HFpEF (last EF 60-65% October 2022), history of recurrent PE on Eliquis, pulmonary hypertension, HTN, HLD, OSA/OHS on CPAP, obesity class 3. Reported 2 days of worsening dyspnea, orthopnea and cough. On her initial physical examination her blood pressure was 144/83, HR 78, RR 34, 02 saturation 94% on 4 L/min per [place]. Distant breath sounds, with no wheezing, heart with S1 and S2 present and rhythmic, abdomen not distended, positive lower extremity edema.   Chest radiograph with cardiomegaly, bilateral interstitial infiltrates, congested hilar vasculature and cephalization of the vasculature.   5/11 initial admission to hospitalist service.  Started on BiPAP and diuresed successfully for 5 L. 5/13 increasing somnolence and hypercarbia new since admission. 5/15 am intubated. Discussed trach with son who wanted to give her a chance of extubation before proceeding straight to trach 5/16 failed SBT for tachypnea 5/17 failed SBT for tachypnea 5/18 failed SBT for rapid and shallow breathing despite high PS 5/19 tracheostomy placed 5/24 Placed on trach collar mid morning of 5/23 and has remained on ATC since  5/25 stable on trach collar 5/27 Transferred from PCCM to [facility] service  5/28 - Remains stable on tracheostomy managed by pulmonology awaiting transfer to facility (who will not take new trach - much mature at least 30 days per documentation). 6/12-Present - increasing activity levels - able to ambulate OOB with assistance; making good progress with PT; continues to wean oxygen daily as tolerated 6/16 -patient continues to progress with  increasing physical activity 6/19 - Pulm continues trach care - plan for weaning to room air -no plan for decannulation at this time given her respiratory status.  Patient remains medically stable to be transfer to rehab. She still has cuffed trach that probably need to be changed to uncuffed before discharge. Follow up with pulmonary for further recommendations. 30 days has passed from trach placement as of 12/29/21 - medically stable and awaiting placement - bed offer/insurance approval pending.  Assessment and Plan:  Acute hypoxemic and hypercapnic respiratory failure due to acute cardiogenic pulmonary edema due to acute on chronic HFpEF and OHS: - s/p tracheostomy 5/19 > ATC 5/24 - Continue trach care per PCCM weekly and prn. Not likely to decannulate in the interim given her use of trilogy at home, body habitus and profound respiratory failure. - Patient has been able to wean oxygen progressively over the past week  Acute on chronic HFpEF, HTN: Echocardiogram with preserved LV systolic function 60 to 81%, RV systolic function is preserved, no significant valvular disease.  - Continue lasix 48m daily. Appears roughly euvolemic s/p significant diuresis earlier in hospitalization. - Continue spironolactone and dapagliflozin.  - Labs stable - no longer following daily  OHS, OSA:  - Weight loss recommended, encouraged to mobilize as much as possible to avoid hypoventilation/atelectasis.   Morbid obesity: Body mass index is 62.19 kg/m.  - Given comorbidities that could be impacted by weight loss, would recommend bariatric surgery evaluation if patient amenable.   History of recurrent PE:  - Continue eliquis.  Anxiety:  - Continue clonazepam low dose scheduled  HLD:   - Continue atorvastatin   Hyponatremia: Mild, stable.   Acute metabolic encephalopathy: Resolved.  Leukocytosis: Resolved without targeted treatment.  Subjective: No acute issues or events overnight; remains in good  spirits as she continues to improve  Objective: Vitals:   01/01/22 1546 01/01/22 1928 01/01/22 2050 01/02/22 0030  BP:  102/72    Pulse: 85 74 71 76  Resp: _0 Temp:  98.7 F (37.1 C)    TempSrc:  Oral    SpO2: 91% 90% 92% 94%  Weight:      Height:       Gen: 56 y.o. female in no distress Pulm: Nonlabored breathing thru trach with PMV. Clear. CV: Regular rate and rhythm. No murmur, rub, or gallop. No JVD, no dependent edema. GI: Abdomen soft, non-tender, non-distended, with normoactive bowel sounds.  Ext: Warm, no deformities Skin: No rashes, lesions or ulcers on visualized skin. Neuro: Alert and oriented. No focal neurological deficits. Psych: Judgement and insight appear fair. Mood euthymic & affect congruent.      Data Personally reviewed: CBC: No results for input(s): "WBC", "NEUTROABS", "HGB", "HCT", "MCV", "PLT" in the last 168 hours.  Basic Metabolic Panel: No results for input(s): "NA", "K", "CL", "CO2", "GLUCOSE", "BUN", "CREATININE", "CALCIUM", "MG", "PHOS" in the last 168 hours.  Family Communication: None at bedside  Disposition: Status is: Inpatient Remains inpatient appropriate because: Unsafe DC; medically stable Planned Discharge Destination: Pending SNF placement, likely not to occur prior to 30 days from trach placement on 5/19   Sauser Ishikawa, MD 01/02/2022 7:38 AM Page by [email or web address]

## 2022-01-03 DIAGNOSIS — I5033 Acute on chronic diastolic (congestive) heart failure: Secondary | ICD-10-CM | POA: Diagnosis not present

## 2022-01-03 DIAGNOSIS — J9621 Acute and chronic respiratory failure with hypoxia: Secondary | ICD-10-CM

## 2022-01-03 NOTE — TOC Progression Note (Signed)
Transition of Care [facility]) - Progression Note    Patient Details  Name: Ginger Jetton MRN: [id number] Date of Birth: 1965-09-08  Transition of Care [facility]) CM/SW Contact  Janae Bridgeman, RN Phone Number: 01/03/2022, 8:09 AM  Clinical Narrative:    CM spoke with Megan Salon, CM for CIR and patient was declined for CIR by insurance provider but is currently in appeals process.  I called and spoke with Dorie Rank, CM with  [facility] and requested review of clinicals for short term placement was back up plan to CIR.  No SNF offers are present at this time - barriers include bariatric needs, trach, and Managed Medicaid provider.  I spoke with Macario Golds, [facility] leadership and she will review case for possible LOG to [facility] since the facility is not in network with the provider until July 15 to off-set the cost and support the admission.  [facility] SN[facility] will review the patient this morning a second time for consideration as backup plan for CIR.  No bed offers present at this time.  CM and MSW with DTP Team will continue to follow the patient for needed rehab placement.  Expected Discharge Plan: Skilled Nursing Facility Barriers to Discharge: No SNF bed, Continued Medical Work up  Expected Discharge Plan and Services Expected Discharge Plan: Skilled Nursing Facility In-house Referral: PCP / [facility] Discharge Planning Services: CM Consult Post Acute Care Choice: Skilled Nursing Facility Living arrangements for the past 2 months: Skilled Nursing Facility Expected Discharge Date: 12/30/21                 DME Agency: [employer]                   Social Determinants of Health (SDOH) Interventions Food Insecurity Interventions: Intervention Not Indicated Transportation Interventions: [facility] [employer] [facility])  Readmission Risk Interventions    12/20/2021   11:33 AM 12/02/2019    1:47 PM  Readmission Risk Prevention Plan  Post Dischage  Appt  Complete  Medication Screening  Complete  Transportation Screening Complete Complete  PCP or Specialist Appt within 3-5 Days Complete   HRI or Home Care Consult Complete   Social Work Consult for Recovery Care Planning/Counseling Complete   Palliative Care Screening Not Applicable   Medication Review [employer]) Complete

## 2022-01-04 DIAGNOSIS — Z7901 Long term (current) use of anticoagulants: Secondary | ICD-10-CM

## 2022-01-04 DIAGNOSIS — J9622 Acute and chronic respiratory failure with hypercapnia: Secondary | ICD-10-CM

## 2022-01-04 DIAGNOSIS — J9621 Acute and chronic respiratory failure with hypoxia: Secondary | ICD-10-CM | POA: Diagnosis not present

## 2022-01-04 DIAGNOSIS — I5033 Acute on chronic diastolic (congestive) heart failure: Secondary | ICD-10-CM | POA: Diagnosis not present

## 2022-01-04 DIAGNOSIS — Z930 Tracheostomy status: Secondary | ICD-10-CM | POA: Diagnosis not present

## 2022-01-04 DIAGNOSIS — Z86711 Personal history of pulmonary embolism: Secondary | ICD-10-CM | POA: Diagnosis not present

## 2022-01-04 DIAGNOSIS — I5032 Chronic diastolic (congestive) heart failure: Secondary | ICD-10-CM | POA: Diagnosis not present

## 2022-01-05 DIAGNOSIS — I5033 Acute on chronic diastolic (congestive) heart failure: Secondary | ICD-10-CM | POA: Diagnosis not present

## 2022-01-05 DIAGNOSIS — Z930 Tracheostomy status: Secondary | ICD-10-CM | POA: Diagnosis not present

## 2022-01-05 DIAGNOSIS — J9621 Acute and chronic respiratory failure with hypoxia: Secondary | ICD-10-CM | POA: Diagnosis not present

## 2022-01-05 DIAGNOSIS — I5032 Chronic diastolic (congestive) heart failure: Secondary | ICD-10-CM | POA: Diagnosis not present

## 2022-01-05 DIAGNOSIS — Z7901 Long term (current) use of anticoagulants: Secondary | ICD-10-CM | POA: Diagnosis not present

## 2022-01-05 DIAGNOSIS — I5031 Acute diastolic (congestive) heart failure: Secondary | ICD-10-CM

## 2022-01-05 DIAGNOSIS — J9622 Acute and chronic respiratory failure with hypercapnia: Secondary | ICD-10-CM | POA: Diagnosis not present

## 2022-01-05 DIAGNOSIS — Z86711 Personal history of pulmonary embolism: Secondary | ICD-10-CM | POA: Diagnosis not present

## 2022-01-05 HISTORY — DX: Chronic diastolic (congestive) heart failure: I50.32

## 2022-01-05 HISTORY — DX: Tracheostomy status: Z93.0

## 2022-01-06 DIAGNOSIS — I270 Primary pulmonary hypertension: Secondary | ICD-10-CM | POA: Diagnosis not present

## 2022-01-06 DIAGNOSIS — Z930 Tracheostomy status: Secondary | ICD-10-CM | POA: Diagnosis not present

## 2022-01-06 DIAGNOSIS — E662 Morbid (severe) obesity with alveolar hypoventilation: Secondary | ICD-10-CM | POA: Diagnosis not present

## 2022-01-06 DIAGNOSIS — I5033 Acute on chronic diastolic (congestive) heart failure: Secondary | ICD-10-CM | POA: Diagnosis not present

## 2022-01-06 NOTE — Progress Notes (Signed)
Inpatient Rehabilitation Admissions Coordinator   Insurance denied CIR admit 6/22 and peer to peer was offered with acute MD. I verified this morning that no peer to peer was completed. I have requested expedited appeal today. I met with patient at bedside and she is aware.   Ottie Glazier, RN, MSN Rehab Admissions Coordinator [phone] 01/06/2022 12:04 PM

## 2022-01-07 DIAGNOSIS — I5033 Acute on chronic diastolic (congestive) heart failure: Secondary | ICD-10-CM | POA: Diagnosis not present

## 2022-01-07 DIAGNOSIS — Z930 Tracheostomy status: Secondary | ICD-10-CM | POA: Diagnosis not present

## 2022-01-07 NOTE — Progress Notes (Signed)
   NAMESophianna Brewer, MRN:  [id number], DOB:  1966-07-12, LOS: 47 ADMISSION DATE:  11/21/2021, CONSULTATION DATE: 11/23/2021 REFERRING MD:  Risa Grill, CHIEF COMPLAINT: Obtundation  History of Present Illness:  56 year old woman who was admitted with acute on chronic hypoxic/hypercapnic respiratory failure, requiring intubation and now s/p trach.   Pertinent  Medical History  Morbid Obesity  OSA - on CPAP  DVT - during pregnancy, RLE 02/2016  Significant Hospital Events: Including procedures, antibiotic start and stop dates in addition to other pertinent events   5/11 initial admission to hospitalist service.  Started on BiPAP and diuresed successfully for 5 L. 5/13 increasing somnolence and hypercarbia new since admission. 5/15 am intubated. Discussed trach with son who wanted to give her a chance of extubation before proceeding straight to trach 5/16 failed SBT for tachypnea 5/17 failed SBT for tachypnea 5/18 failed SBT for rapid and shallow breathing despite high PS 5/19 tracheostomy placed 5/24 Placed on trach collar mid morning of 5/23 and has remained on ATC since  5/25 Remains on ATC this am, continues to have secretions but appears to be managing them well  6/12 Tolerating PMV, on 28%  Interim History / Subjective:  Afebrile  Room air with PMV She has minimal sputum production each morning  Objective   Blood pressure 113/78, pulse 62, temperature 97.9 F (36.6 C), temperature source Oral, resp. rate 18, height 5\' 7"  (1.702 m), weight (!) 180.1 kg, last menstrual period 11/29/2015, SpO2 91 %.    FiO2 (%):  [28 %] 28 %   Intake/Output Summary (Last 24 hours) at 01/07/2022 8756 Last data filed at 01/06/2022 2156 Gross per 24 hour  Intake 50 ml  Output --  Net 50 ml   Filed Weights   12/18/21 0511 12/19/21 0500 01/01/22 1100  Weight: (!) 185 kg (!) 187.7 kg (!) 180.1 kg    Examination: General: adult female sitting up in chair, NAD  HEENT: MM pink/moist, #6 trach  midline c/d/I, PMV in place, anicteric, good dentition  Neuro: AAOx4, MAE CV: s1s2 RRR, no m/r/g PULM: non-labored at rest, lungs bilaterally clear with good air entry  GI: soft, bsx4 active  Extremities: warm/dry, no edema  Skin: no rashes or lesions  Assessment & Plan:   Nocturnal hypoxemia Obstructive sleep apnea/obesity hypoventilation syndrome-tracheostomy tube in place  -Nocturnal trach collar with humidity -trach care per protocol  -will need outpatient trach clinic follow up with Anders Simmonds, NP at discharge  -at this point, patient is not a candidate for decannulation (previously was using Trilogy at home) -PMV as tolerated  -mobilize - PT efforts  -nutrition for weight counseling    Rest per TRH / primary SVC.    PCCM will continue to follow once per week. Please call sooner if new needs arise.        Melody Comas, MD [facility] Office: [phone]   See Amion for personal pager PCCM on call pager [phone] until 7pm. Please call Elink 7p-7a. [phone]

## 2022-01-07 NOTE — Progress Notes (Signed)
CSW spoke with Mikeal Hawthorne of [employer] [employer] Medicaid who states the company will overturn the denial and approve the authorization for inpatient rehab.  CSW notifed [place], PT of information.  Edwin Dada, MSW, LCSW Transitions of Care  Clinical Social Worker II [phone]

## 2022-01-07 NOTE — Progress Notes (Signed)
Inpatient Rehab Admissions Coordinator:  Met with patient, insurance is still in appeals process. Discussed with patient that she may need to prepare to go home if insurance denies. Will continue to follow.   Lissa Merlin, PT, [facility] Admissions Coordinator 01/07/22,11:13 AM

## 2022-01-07 NOTE — Progress Notes (Signed)
  Progress Note   Patient: Angel Brewer UJW:[id number] DOB: 09-15-1965 DOA: 11/21/2021     47 DOS: the patient was seen and examined on 01/07/2022   Brief hospital course: 56yow PMH HFpEF (last EF 60-65% October 2022), recurrent PE on Eliquis, pulm HTN,  OSA/OHS on CPAP, obesity class 3 who reported 2 days of worsening dyspnea, orthopnea and cough. Admitted with the working diagnosis of acute on chronic diastolic heart failure, complicated with acute hypoxemic respiratory failure. Timeline: 5/11 initial admission to hospitalist service.  Started on BiPAP and diuresed successfully for 5 L. 5/13 increasing somnolence and hypercarbia new since admission. 5/15 am intubated. Discussed trach with son who wanted to give her a chance of extubation before proceeding straight to trach 5/16 failed SBT for tachypnea 5/17 failed SBT for tachypnea 5/18 failed SBT for rapid and shallow breathing despite high PS 5/19 tracheostomy placed 5/24 Placed on trach collar mid morning of 5/23 and has remained on ATC since  5/25 stable on TC Transferred from PCCM to [facility] service 5/27 Patient is medically stable to be transfer to rehab.  Pulmonary following to manage trach care  6/22 CIR declined. Remains medically stable. 6/27 insurance approved appeal. Await CIR physician review. Remains medically stable.  Assessment and Plan:  Acute hypoxemic and hypercapnic respiratory failure due to acute cardiogenic pulmonary edema due to acute on chronic HFpEF and OHS: --acute issues long resolved --s/p tracheostomy 5/19 > ATC 5/24 --Continue trach care per PCCM weekly and PRN. Not likely to decannulate in the interim given her use of trilogy at home, body habitus and history of profound respiratory failure. --on room air, management per PCCM, see note 6/27   Acute on chronic HFpEF, HTN: Echocardiogram with preserved LV systolic function 60 to 65%, RV systolic function is preserved, no significant valvular disease.   --stable. Continue furosemide, spironolactone and dapagliflozin.  --follow BMP monthly   OHS, OSA:  --Weight loss recommended, encouraged to mobilize as much as possible to avoid hypoventilation/atelectasis.    Morbid obesity: Body mass index is 62.19 kg/m.  --consider outpatient weight loss specialist or bariatric surgery   PMH recurrent PE --Continue apixaban.   Anxiety:  --appears stable. Continue clonazepam low dose scheduled   Hyperlipidemia --Continue atorvastatin   Acute metabolic encephalopathy: Resolved.      Subjective:  Feels ok  Physical Exam: Vitals:   01/07/22 0741 01/07/22 0806 01/07/22 1128 01/07/22 1525  BP:  113/78    Pulse: 71 62 77 93  Resp: 18  18 18   Temp:  97.9 F (36.6 C)    TempSrc:  Oral    SpO2: 90% 91% 90% 90%  Weight:      Height:       Physical Exam Constitutional:      General: She is not in acute distress.    Appearance: She is not ill-appearing or toxic-appearing.  Neurological:     Mental Status: She is alert.  Psychiatric:        Mood and Affect: Mood normal.        Behavior: Behavior normal.     Data Reviewed:  No new data  Family Communication: none  Disposition: Status is: Inpatient Remains inpatient appropriate because: awaiting CIR  Planned Discharge Destination:  CIR    Time spent: 10 minutes  Author: Brendia Sacks, MD 01/07/2022 5:22 PM  For on call review [email or web address].

## 2022-01-08 ENCOUNTER — Other Ambulatory Visit: Payer: Self-pay

## 2022-01-08 ENCOUNTER — Inpatient Hospital Stay (HOSPITAL_COMMUNITY)
Admission: RE | Admit: 2022-01-08 | Discharge: 2022-01-16 | DRG: 945 | Disposition: A | Discharge status: Home / Self Care | Payer: Medicaid Other | Source: Intra-hospital | Attending: Physical Medicine & Rehabilitation | Admitting: Physical Medicine & Rehabilitation

## 2022-01-08 ENCOUNTER — Encounter (HOSPITAL_COMMUNITY): Payer: Self-pay | Admitting: Physical Medicine and Rehabilitation

## 2022-01-08 DIAGNOSIS — M25562 Pain in left knee: Secondary | ICD-10-CM | POA: Diagnosis not present

## 2022-01-08 DIAGNOSIS — G4733 Obstructive sleep apnea (adult) (pediatric): Secondary | ICD-10-CM | POA: Diagnosis not present

## 2022-01-08 DIAGNOSIS — R5381 Other malaise: Principal | ICD-10-CM | POA: Diagnosis present

## 2022-01-08 DIAGNOSIS — M545 Low back pain, unspecified: Secondary | ICD-10-CM | POA: Diagnosis not present

## 2022-01-08 DIAGNOSIS — Z930 Tracheostomy status: Secondary | ICD-10-CM | POA: Diagnosis not present

## 2022-01-08 DIAGNOSIS — J9690 Respiratory failure, unspecified, unspecified whether with hypoxia or hypercapnia: Secondary | ICD-10-CM | POA: Diagnosis present

## 2022-01-08 DIAGNOSIS — R0602 Shortness of breath: Secondary | ICD-10-CM | POA: Diagnosis not present

## 2022-01-08 DIAGNOSIS — Z8249 Family history of ischemic heart disease and other diseases of the circulatory system: Secondary | ICD-10-CM

## 2022-01-08 DIAGNOSIS — Z7901 Long term (current) use of anticoagulants: Secondary | ICD-10-CM | POA: Diagnosis not present

## 2022-01-08 DIAGNOSIS — I272 Pulmonary hypertension, unspecified: Secondary | ICD-10-CM | POA: Diagnosis present

## 2022-01-08 DIAGNOSIS — I5042 Chronic combined systolic (congestive) and diastolic (congestive) heart failure: Secondary | ICD-10-CM | POA: Diagnosis not present

## 2022-01-08 DIAGNOSIS — J9621 Acute and chronic respiratory failure with hypoxia: Secondary | ICD-10-CM | POA: Diagnosis not present

## 2022-01-08 DIAGNOSIS — G8929 Other chronic pain: Secondary | ICD-10-CM

## 2022-01-08 DIAGNOSIS — E662 Morbid (severe) obesity with alveolar hypoventilation: Secondary | ICD-10-CM | POA: Diagnosis not present

## 2022-01-08 DIAGNOSIS — I2699 Other pulmonary embolism without acute cor pulmonale: Secondary | ICD-10-CM

## 2022-01-08 DIAGNOSIS — E876 Hypokalemia: Secondary | ICD-10-CM | POA: Diagnosis not present

## 2022-01-08 DIAGNOSIS — Z87891 Personal history of nicotine dependence: Secondary | ICD-10-CM | POA: Diagnosis not present

## 2022-01-08 DIAGNOSIS — Z6841 Body Mass Index (BMI) 40.0 and over, adult: Secondary | ICD-10-CM | POA: Diagnosis not present

## 2022-01-08 DIAGNOSIS — M1710 Unilateral primary osteoarthritis, unspecified knee: Secondary | ICD-10-CM | POA: Diagnosis present

## 2022-01-08 DIAGNOSIS — I5033 Acute on chronic diastolic (congestive) heart failure: Secondary | ICD-10-CM | POA: Diagnosis present

## 2022-01-08 DIAGNOSIS — J9620 Acute and chronic respiratory failure, unspecified whether with hypoxia or hypercapnia: Secondary | ICD-10-CM

## 2022-01-08 DIAGNOSIS — I110 Hypertensive heart disease with heart failure: Secondary | ICD-10-CM | POA: Diagnosis present

## 2022-01-08 DIAGNOSIS — Z86711 Personal history of pulmonary embolism: Secondary | ICD-10-CM | POA: Diagnosis not present

## 2022-01-08 DIAGNOSIS — I10 Essential (primary) hypertension: Secondary | ICD-10-CM | POA: Diagnosis not present

## 2022-01-08 DIAGNOSIS — R234 Changes in skin texture: Secondary | ICD-10-CM | POA: Diagnosis not present

## 2022-01-08 DIAGNOSIS — R7989 Other specified abnormal findings of blood chemistry: Secondary | ICD-10-CM | POA: Diagnosis not present

## 2022-01-08 DIAGNOSIS — M25561 Pain in right knee: Secondary | ICD-10-CM

## 2022-01-08 DIAGNOSIS — I959 Hypotension, unspecified: Secondary | ICD-10-CM | POA: Diagnosis not present

## 2022-01-08 DIAGNOSIS — I509 Heart failure, unspecified: Secondary | ICD-10-CM | POA: Diagnosis not present

## 2022-01-08 DIAGNOSIS — Z79899 Other long term (current) drug therapy: Secondary | ICD-10-CM | POA: Diagnosis not present

## 2022-01-08 DIAGNOSIS — N179 Acute kidney failure, unspecified: Secondary | ICD-10-CM | POA: Diagnosis not present

## 2022-01-08 DIAGNOSIS — E785 Hyperlipidemia, unspecified: Secondary | ICD-10-CM | POA: Diagnosis not present

## 2022-01-08 DIAGNOSIS — Z86718 Personal history of other venous thrombosis and embolism: Secondary | ICD-10-CM | POA: Diagnosis not present

## 2022-01-08 MED ORDER — PROCHLORPERAZINE MALEATE 5 MG PO TABS: 5.0000 mg | ORAL_TABLET | Freq: Four times a day (QID) | ORAL | Status: DC | PRN

## 2022-01-08 MED ORDER — DICLOFENAC SODIUM 1 % EX GEL
4.0000 g | Freq: Four times a day (QID) | CUTANEOUS | Status: DC
  Administered 2022-01-08 – 2022-01-15 (×20): 4 g via TOPICAL
  Filled 2022-01-08: qty 100

## 2022-01-08 MED ORDER — METHOCARBAMOL 500 MG PO TABS
500.0000 mg | ORAL_TABLET | Freq: Four times a day (QID) | ORAL | Status: DC | PRN
  Filled 2022-01-08: qty 1

## 2022-01-08 MED ORDER — FLEET ENEMA 7-19 GM/118ML RE ENEM
1.0000 | ENEMA | Freq: Once | RECTAL | Status: DC | PRN
Start: 2022-01-08 — End: 2022-01-16

## 2022-01-08 MED ORDER — ACETAMINOPHEN 325 MG PO TABS
325.0000 mg | ORAL_TABLET | ORAL | Status: DC | PRN
  Filled 2022-01-08: qty 2

## 2022-01-08 MED ORDER — FUROSEMIDE 40 MG PO TABS
40.0000 mg | ORAL_TABLET | Freq: Every day | ORAL | Status: DC
Start: 2022-01-09 — End: 2022-01-13
  Administered 2022-01-09 – 2022-01-13 (×5): 40 mg via ORAL
  Filled 2022-01-08 (×4): qty 1

## 2022-01-08 MED ORDER — DIPHENHYDRAMINE HCL 12.5 MG/5ML PO ELIX: 12.5000 mg | ORAL_SOLUTION | Freq: Four times a day (QID) | ORAL | Status: DC | PRN

## 2022-01-08 MED ORDER — LIP MEDEX EX OINT: TOPICAL_OINTMENT | CUTANEOUS | Status: DC | PRN

## 2022-01-08 MED ORDER — MAGNESIUM HYDROXIDE 400 MG/5ML PO SUSP: 30.0000 mL | Freq: Every day | ORAL | Status: DC | PRN

## 2022-01-08 MED ORDER — PROCHLORPERAZINE 25 MG RE SUPP: 12.5000 mg | Freq: Four times a day (QID) | RECTAL | Status: DC | PRN

## 2022-01-08 MED ORDER — CLONAZEPAM 0.25 MG PO TBDP: 0.2500 mg | ORAL_TABLET | Freq: Two times a day (BID) | ORAL | Status: DC

## 2022-01-08 MED ORDER — SPIRONOLACTONE 25 MG PO TABS
25.0000 mg | ORAL_TABLET | Freq: Every day | ORAL | Status: DC
  Administered 2022-01-09 – 2022-01-16 (×8): 25 mg via ORAL
  Filled 2022-01-08 (×8): qty 1

## 2022-01-08 MED ORDER — DAPAGLIFLOZIN PROPANEDIOL 10 MG PO TABS
10.0000 mg | ORAL_TABLET | Freq: Every day | ORAL | Status: DC
  Administered 2022-01-09 – 2022-01-16 (×8): 10 mg via ORAL
  Filled 2022-01-08 (×8): qty 1

## 2022-01-08 MED ORDER — GUAIFENESIN-DM 100-10 MG/5ML PO SYRP: 5.0000 mL | ORAL_SOLUTION | Freq: Four times a day (QID) | ORAL | Status: DC | PRN

## 2022-01-08 MED ORDER — APIXABAN 5 MG PO TABS
5.0000 mg | ORAL_TABLET | Freq: Two times a day (BID) | ORAL | Status: DC
  Administered 2022-01-08 – 2022-01-16 (×16): 5 mg via ORAL
  Filled 2022-01-08 (×16): qty 1

## 2022-01-08 MED ORDER — ORAL CARE MOUTH RINSE
15.0000 mL | Freq: Two times a day (BID) | OROMUCOSAL | Status: DC
  Administered 2022-01-08 – 2022-01-14 (×13): 15 mL via OROMUCOSAL

## 2022-01-08 MED ORDER — TRAZODONE HCL 50 MG PO TABS: 25.0000 mg | ORAL_TABLET | Freq: Every evening | ORAL | Status: DC | PRN

## 2022-01-08 MED ORDER — ATORVASTATIN CALCIUM 10 MG PO TABS
20.0000 mg | ORAL_TABLET | Freq: Every day | ORAL | Status: DC
  Administered 2022-01-09 – 2022-01-16 (×8): 20 mg via ORAL
  Filled 2022-01-08 (×8): qty 2

## 2022-01-08 MED ORDER — BISACODYL 5 MG PO TBEC: 5.0000 mg | DELAYED_RELEASE_TABLET | Freq: Every day | ORAL | Status: DC | PRN

## 2022-01-08 MED ORDER — PROCHLORPERAZINE EDISYLATE 10 MG/2ML IJ SOLN: 5.0000 mg | Freq: Four times a day (QID) | INTRAMUSCULAR | Status: DC | PRN

## 2022-01-08 MED ORDER — ALUM & MAG HYDROXIDE-SIMETH 200-200-20 MG/5ML PO SUSP: 30.0000 mL | ORAL | Status: DC | PRN

## 2022-01-08 MED ORDER — ALBUTEROL SULFATE (2.5 MG/3ML) 0.083% IN NEBU: 3.0000 mL | INHALATION_SOLUTION | RESPIRATORY_TRACT | Status: DC | PRN

## 2022-01-08 MED ORDER — PANTOPRAZOLE SODIUM 40 MG PO TBEC
40.0000 mg | DELAYED_RELEASE_TABLET | Freq: Every day | ORAL | Status: DC
  Administered 2022-01-09 – 2022-01-16 (×8): 40 mg via ORAL
  Filled 2022-01-08 (×8): qty 1

## 2022-01-08 NOTE — Progress Notes (Signed)
CSW was notified by Mickel Baas of CIR that the patient can be accepted there today. Patient's insurance authorization was approved and there is a bed available.  Madilyn Fireman, MSW, LCSW Transitions of Care  Clinical Social Worker II [phone]

## 2022-01-08 NOTE — Progress Notes (Signed)
PMR Admission Coordinator Pre-Admission Assessment   Patient: Angel Brewer is an 56 y.o., female MRN: [id number] DOB: 02/05/1966 Height: _0  (170.2 cm) Weight: (!) 180.1 kg   Insurance Information HMO:     PPO:      PCP:      IPA:      80/20:      OTHER:  PRIMARY: [place] Medicaid prepaid Healthy Blue Plan      Policy#: [id number]      Subscriber:  CM Name: [place]      Phone#: [phone]     Fax#: [phone]  [place] with Blue Medicaid  called 02/06/22 with approval for 10 days 6/27-7/6 with updatesdue 7/6.  Pre-Cert#: [id number]       Employer:  Benefits:  Phone #: [phone]     Name:  Irene Shipper Date: 01/12/2020 - still active Deductible: $0 - does not have OOP Max: $0 - does not have CIR: 100% coverage SNF: 100% coverage Outpatient: $4 co-pay; limited to 27 visits/cal yr (27 remaining) Home Health: 100% coverage; limited by medical necessity review DME: 100% coverage; limited by medical necessity review Providers: in network  SECONDARY:       Policy#:      Phone#:    Development worker, community:       Phone#:    The Engineer, petroleum" for patients in Inpatient Rehabilitation Facilities with attached "Privacy Act [place] Records" was provided and verbally reviewed with: Patient   Emergency Contact Information Contact Information       Name Relation Home Work Mobile    Angel Brewer Son     [place] Niece     [phone]    Angel Brewer [phone]               Current Medical History  Patient Admitting Diagnosis: Heart Failure, Respiratory Failure  History of Present Illness:Angel Brewer is a 56 y.o. female with medical history significant for HFpEF (last EF 60-65% October 2022), history of recurrent PE on Eliquis, pulmonary hypertension, HTN, HLD, OSA/OHS on CPAP, morbid obesity who presented to the [facility] ED 11/21/21 for evaluation of dyspnea. On admission, Initial vitals showed BP 144/83, pulse 78, RR 34, temp 99.2  F, SPO2 92% on 4 L O2 via [place].  Per ED triage documentation, SPO2 was 60% while on room air.Labs showed WBC 5.5, hemoglobin 12.6, platelets 210,000, sodium 140, potassium 4.3, bicarb 30, BUN of 12, creatinine 0.95, serum glucose 86, BNP 139.2, troponin 5. SARS-CoV-2 and influenza PCR negative.  Pulm chest x-ray shows moderate severity increased interstitial lung markings/pulmonary edema.Patient was given IV Lasix 60 mg in the ED and the hospitalist service was consulted to admit for further evaluation and management. Pt. Was found to have workup for acute on chronic HF and was transferred to ICU secondary to hypercarbic respiratory failure with increased obtundation. Pt. Intubated 5/15 and decision was made to trach her 5/19. She was weaned to trach collar 12/03/20. Pt. Weaned to room air, donning PMV during all waking hours and independent with its use and care. PT/OT/SLP saw Pt. And recommended CIR to assist return to PLOF.    Patient's medical record from [facility]  has been reviewed by the rehabilitation admission coordinator and physician.   Past Medical History      Past Medical History:  Diagnosis Date   DVT (deep vein thrombosis) in pregnancy      RLE DVT 02/2016   Dyspnea     Morbid obesity (  [place])     PE (pulmonary embolism) 02/29/2016   Sleep apnea      uses a cpap      Has the patient had major surgery during 100 days prior to admission? Yes   Family History   family history includes Hypertension in her sister.   Current Medications   Current Facility-Administered Medications:    0.9 %  sodium chloride infusion, 250 mL, Intravenous, Continuous, Meier, Hortencia Conradi, MD, Stopped at 12/04/21 1440   acetaminophen (TYLENOL) tablet 650 mg, 650 mg, Oral, Q6H PRN **OR** acetaminophen (TYLENOL) suppository 650 mg, 650 mg, Rectal, Q6H PRN, Arrien, Jimmy Picket, MD   albuterol (PROVENTIL) (2.5 MG/3ML) 0.083% nebulizer solution 3 mL, 3 mL, Inhalation, Q4H PRN, Lenore Cordia, MD   apixaban (ELIQUIS) tablet 5 mg, 5 mg, Oral, BID, Arrien, Jimmy Picket, MD, 5 mg at 01/01/22 0851   atorvastatin (LIPITOR) tablet 20 mg, 20 mg, Oral, Daily, Vance Gather B, MD, 20 mg at 01/01/22 0851   clonazePAM (KLONOPIN) disintegrating tablet 0.25 mg, 0.25 mg, Oral, BID, Arrien, Jimmy Picket, MD, 0.25 mg at 01/01/22 0851   dapagliflozin propanediol (FARXIGA) tablet 10 mg, 10 mg, Oral, Daily, Arrien, Jimmy Picket, MD, 10 mg at 01/01/22 0851   dicyclomine (BENTYL) capsule 10 mg, 10 mg, Oral, BID PRN, Domenic Polite, MD, 10 mg at 12/10/21 1314   feeding supplement (ENSURE ENLIVE / ENSURE PLUS) liquid 237 mL, 237 mL, Oral, BID BM, Candee Furbish, MD, 237 mL at 12/26/21 1506   furosemide (LASIX) tablet 40 mg, 40 mg, Oral, Daily, Vance Gather B, MD, 40 mg at 01/01/22 0851   lip balm (CARMEX) ointment, , Topical, PRN, Spero Geralds, MD   MEDLINE mouth rinse, 15 mL, Mouth Rinse, q12n4p, Candee Furbish, MD, 15 mL at 01/01/22 1231   multivitamin with minerals tablet 1 tablet, 1 tablet, Oral, Daily, Arrien, Jimmy Picket, MD, 1 tablet at 12/25/21 0934   ondansetron (ZOFRAN) tablet 4 mg, 4 mg, Oral, Q6H PRN, 4 mg at 12/23/21 1423 **OR** ondansetron (ZOFRAN) injection 4 mg, 4 mg, Intravenous, Q6H PRN, Arrien, Jimmy Picket, MD, 4 mg at 12/13/21 0115   oxyCODONE (Oxy IR/ROXICODONE) immediate release tablet 5 mg, 5 mg, Oral, Q6H PRN, Arrien, Jimmy Picket, MD   pantoprazole (PROTONIX) EC tablet 40 mg, 40 mg, Oral, Daily, Arrien, Jimmy Picket, MD, 40 mg at 01/01/22 0851   polyethylene glycol (MIRALAX / GLYCOLAX) packet 17 g, 17 g, Oral, Daily PRN, Arrien, Jimmy Picket, MD   senna-docusate (Senokot-S) tablet 1 tablet, 1 tablet, Oral, BID, Arrien, Jimmy Picket, MD, 1 tablet at 12/25/21 0934   simethicone (MYLICON) chewable tablet 80 mg, 80 mg, Oral, QID PRN, Opyd, Ilene Qua, MD, 80 mg at 12/15/21 0216   spironolactone (ALDACTONE) tablet 25 mg, 25 mg, Oral, Daily, Patrecia Pour, MD, 25 mg at 01/01/22 6015   Patients Current Diet:  Diet Order                  Diet regular Room service appropriate? Yes; Fluid consistency: Thin  Diet effective now                         Precautions / Restrictions Precautions Precautions: Fall Precaution Comments: trach on RA, PMSV Restrictions Weight Bearing Restrictions: No    Has the patient had 2 or more falls or a fall with injury in the past year? Yes   Prior Activity Level Community (5-7x/wk): Pt working and  active in the community PTA   Prior Functional Level Self Care: Did the patient need help bathing, dressing, using the toilet or eating? Independent   Indoor Mobility: Did the patient need assistance with walking from room to room (with or without device)? Independent   Stairs: Did the patient need assistance with internal or external stairs (with or without device)? Independent   Functional Cognition: Did the patient need help planning regular tasks such as shopping or remembering to take medications? Independent   Patient Information Are you of Hispanic, Latino/a,or Spanish origin?: A. No, not of Hispanic, Latino/a, or Spanish origin What is your race?: B. Black or African American Do you need or want an interpreter to communicate with a doctor or health care staff?: 0. No   Patient's Response To:  Health Literacy and Transportation Is the patient able to respond to health literacy and transportation needs?: Yes Health Literacy - How often do you need to have someone help you when you read instructions, pamphlets, or other written material from your doctor or pharmacy?: Never In the past 12 months, has lack of transportation kept you from medical appointments or from getting medications?: No In the past 12 months, has lack of transportation kept you from meetings, work, or from getting things needed for daily living?: No   Home Assistive Devices / [place] Devices/Equipment:  Dentures (specify type), CPAP (Upper & lower dentures) Home Equipment: Rolling Walker (2 wheels), BSC/3in1, Shower seat   Prior Device Use: Indicate devices/aids used by the patient prior to current illness, exacerbation or injury? None of the above   Current Functional Level Cognition   Overall Cognitive Status: Within Functional Limits for tasks assessed Difficult to assess due to: Tracheostomy Orientation Level: Oriented X4 General Comments: Motivated with great participation, pt reports she is eager to DC and a bit frustrated with placement delays due to her weight.    Extremity Assessment (includes Sensation/Coordination)   Upper Extremity Assessment: Overall WFL for tasks assessed RUE Deficits / Details: grasp 4/5, elbow AROM WFL, shoulder AROM WFL limited by lines - able to bring grooming items to face RUE Sensation: WNL LUE Deficits / Details: grasp 4/5, elbow AROM, shoulder AROM WFL limited lines LUE Sensation: WNL  Lower Extremity Assessment: Defer to PT evaluation RLE Deficits / Details: limited ankle AROM DF, limited knee flexion AAROM due to stiffness, good hip abduction AROM RLE Sensation: WNL LLE Deficits / Details: limited ankle AROM DF, limited knee flexion AAROM due to stiffness, good hip abduction AROM LLE Sensation: WNL     ADLs   Overall ADL's : Needs assistance/impaired Eating/Feeding: Independent, Sitting Eating/Feeding Details (indicate cue type and reason): able to take small sips of water Grooming: Wash/dry hands, Supervision/safety, Standing Grooming Details (indicate cue type and reason): Pt walked to sink to groom in standing. Stood at sink for appx 1 minute with supervision. Upper Body Bathing: Minimal assistance, Sitting Upper Body Bathing Details (indicate cue type and reason): pt able to participate in front, max A for back Lower Body Bathing: Moderate assistance, Sit to/from stand Lower Body Bathing Details (indicate cue type and reason): pt would  benefit from long sponge at home to assist wtih reaching all areas due to obesity. Upper Body Dressing : Minimal assistance, Sitting Upper Body Dressing Details (indicate cue type and reason): changed gown Lower Body Dressing: Moderate assistance, Sit to/from stand, Cueing for compensatory techniques Lower Body Dressing Details (indicate cue type and reason): Pt has reacher and sock aid at  home which will allow pt to be much more independent. Toilet Transfer: Minimal assistance, Ambulation, Rolling walker (2 wheels), Comfort height toilet, Grab bars Toilet Transfer Details (indicate cue type and reason): required assistance to stand from toilet Toileting- Clothing Manipulation and Hygiene: Min guard, Sitting/lateral lean Functional mobility during ADLs: Minimal assistance, Rolling walker (2 wheels) General ADL Comments: able to sit on toilet with min guard and min assist to stand     Mobility   Overal bed mobility: Needs Assistance Bed Mobility: Supine to Sit Rolling: Min assist Supine to sit: Min assist, HOB elevated Sit to supine: Min assist General bed mobility comments: in recliner up entry     Transfers   Overall transfer level: Needs assistance Equipment used: Rolling walker (2 wheels) Transfers: Sit to/from Stand, Bed to chair/wheelchair/BSC Sit to Stand: Min assist, Min guard Bed to/from chair/wheelchair/BSC transfer type:: Stand pivot Stand pivot transfers: Min assist Step pivot transfers: Min guard Transfer via Lift Equipment: Maximove General transfer comment: min assist to min guard for sit to stands from chair and toilet     Ambulation / Gait / Stairs / Wheelchair Mobility   Ambulation/Gait Ambulation/Gait assistance: Min guard Gait Distance (Feet): 200 Feet (including x2 standing breaks) Assistive device: Rolling walker (2 wheels) (bariatric) Gait Pattern/deviations: Wide base of support, Decreased stride length General Gait Details: SpO2 noisy signal, pt not  dyspneic with exertion. O2 WFL 92% when taken in room after return to chair, HR reading high 90's bpm after gait trial in room but does increase to 130 bpm with STS x 10 reps in a row Gait velocity: decreased Gait velocity interpretation: <1.31 ft/sec, indicative of household ambulator Pre-gait activities: standing hip flexion x10 reps (limited due to pt loose stools) Stairs:  (pt stepping up/down onto 1" platform for standing weight x3 reps w/min guard to minA, plan to trial curb height step next session)     Posture / Balance Dynamic Sitting Balance Sitting balance - Comments: assistance due to air mattress Balance Overall balance assessment: Needs assistance Sitting-balance support: Feet supported Sitting balance-[name]: Fair Sitting balance - Comments: assistance due to air mattress Postural control: Posterior lean Standing balance support: No upper extremity supported Standing balance-[name]: Fair Standing balance comment: able to static stand while addressing bed making goal     Special needs/care consideration Trach size Shiley 6 cuffless and Skin abrasion to left leg and erythema on groin    Previous Home Environment (from acute therapy documentation) Living Arrangements: Alone  Lives With: Alone Available Help at Discharge: Family, Available PRN/intermittently, Other (Comment) (son and niece) Type of Home: House Home Layout: Able to live on main level with bedroom/bathroom Home Access: Stairs to enter Technical brewer of Steps: 1 Bathroom Shower/Tub: Multimedia programmer: Handicapped height Bathroom Accessibility: Yes Home Care Services: No Additional Comments: reports a son and niece can assist at d.c   Discharge Living Setting Plans for Discharge Living Setting: Patient's home Type of Home at Discharge: House Discharge Home Layout: Able to live on main level with bedroom/bathroom Discharge Home Access: Stairs to enter Entrance Stairs-Rails:  None Entrance Stairs-Number of Steps: 1 Discharge Bathroom Shower/Tub: Walk-in shower Discharge Bathroom Toilet: Handicapped height Discharge Bathroom Accessibility: Yes How Accessible: Accessible via walker Does the patient have any problems obtaining your medications?: No   Social/Family/Support Systems Ability/Limitations of Caregiver: pt. reports neice can assist PRN   Goals Patient/Family Goal for Rehab: PT/OT mod I Expected length of stay: 5-7 days Pt/Family Agrees to Admission  and willing to participate: Yes Program Orientation Provided & Reviewed with Pt/Caregiver Including Roles  & Responsibilities: Yes   Decrease burden of Care through IP rehab admission: Specialzed equipment needs, Decrease number of caregivers, Bowel and bladder program, and Patient/family education   Possible need for SNF placement upon discharge: not anticipated   Patient Condition: I have reviewed medical records from [facility], spoken with CM, and patient. I met with patient at the bedside for inpatient rehabilitation assessment.  Patient will benefit from ongoing PT and OT, can actively participate in 3 hours of therapy a day 5 days of the week, and can make measurable gains during the admission.  Patient will also benefit from the coordinated team approach during an Inpatient Acute Rehabilitation admission.  The patient will receive intensive therapy as well as Rehabilitation physician, nursing, social worker, and care management interventions.  Due to safety, disease management, medication administration, and patient education the patient requires 24 hour a day rehabilitation nursing.  The patient is currently min A-min guard with mobility and basic ADLs.  Discharge setting and therapy post discharge at home with home health is anticipated.  Patient has agreed to participate in the Acute Inpatient Rehabilitation Program and will admit today.   Preadmission Screen Completed By:  Genella Mech, 01/01/2022 2:37 PM ______________________________________________________________________   Discussed status with Dr. Curlene Dolphin on 01/08/22 at 85 and received approval for admission today.   Admission Coordinator:  Genella Mech, CCC-SLP, time 951/Date 01/08/22    Assessment/Plan: Diagnosis: acute on chronic hypoxic/hypercapnic respiratory failure Does the need for close, 24 hr/day Medical supervision in concert with the patient's rehab needs make it unreasonable for this patient to be served in a less intensive setting? Yes Co-Morbidities requiring supervision/potential complications:Obesity, OSA, Anxiety, HLD,  Due to bladder management, bowel management, safety, skin/wound care, disease management, medication administration, pain management, and patient education, does the patient require 24 hr/day rehab nursing? Yes Does the patient require coordinated care of a physician, rehab nurse, PT, OT, and SLP to address physical and functional deficits in the context of the above medical diagnosis(es)? Yes Addressing deficits in the following areas: balance, endurance, locomotion, strength, transferring, bowel/bladder control, bathing, dressing, feeding, grooming, and toileting Can the patient actively participate in an intensive therapy program of at least 3 hrs of therapy 5 days a week? Yes The potential for patient to make measurable gains while on inpatient rehab is excellent Anticipated functional outcomes upon discharge from inpatient rehab: modified independent PT, modified independent OT, n/a SLP Estimated rehab length of stay to reach the above functional goals is: 5-7   Anticipated discharge destination: Home 10. Overall Rehab/Functional Prognosis: good     MD Signature: Jennye Boroughs

## 2022-01-08 NOTE — H&P (Incomplete)
Physical Medicine and Rehabilitation Admission H&P    CC: Debility secondary to heart and respiratory failure  HPI: Angel Brewer is a 56 year old female who presented to the emergency department on 11/21/2021 with worsening shortness of breath, exertional dyspnea, orthopnea and nonproductive cough.  She has past medical history of heart failure with preserved ejection fraction, history of recurrent PE on Eliquis and pulmonary hypertension.  She also has a history of morbid obesity with obstructive sleep apnea/obesity related hypoventilation syndrome on CPAP trilogy at home.  Chest x-ray consistent with increased interstitial lung markings/pulmonary edema and she was given Lasix 60 mg IV.  Heparin infusion. Her initial oxygen saturation was 60% on room air and on admission was requiring 4 L O2 via nasal cannula to maintain her SPO2 greater than 92%.  She had not been requiring supplemental oxygen at home.  COVID-negative.  Admitted for further evaluation and observation.  Updated echocardiogram with ejection fraction of 60 to 65%.  BiPAP used nightly.  Worsening respiratory status with somnolence and hypercarbia on 5/13 and ABGs obtained.  Critical care medicine/pulmonology consulted. Transferred to ICU, intubated on 5/15. Tracheostomy by Dr. Tacy Learn 5/19 after failed SBTs.  Placed on trach collar 5/24.  Transferred to hospitalist service on 5/27.  Started dysphagia 3 diet on 5/23 and was tolerating this therefore tube feeds and nasogastric tube were discontinued.  Diuresis continued.  Using Passy-Muir valve and followed by pulmonary/critical care medicine weekly and as needed.  Felt not likely to decannulate given her use of Trelegy at home.  PT/OT evaluations carried out and mobility initiated.  Weaning from oxygen supplementation continued.  Daytime oxygen saturations currently stable on room air.  Follow-up by PCCM on 6/27.  Nocturnal hypoxemia noted and recommend trach collar with O2/humidity at night.   Trach care per protocol.  Will need outpatient trach clinic follow-up with [name], NP at discharge.  At this point, the patient is not a candidate for decannulation previously using trilogy at home.  Passy-Muir valve as tolerated. The patient requires inpatient physical medicine and rehabilitation evaluations and treatment secondary to dysfunction due to respiratory failure, heart failure, morbid obesity.  Review of Systems  Constitutional:  Negative for chills and fever.  HENT:  Negative for hearing loss and sore throat.   Respiratory:  Positive for cough and shortness of breath.        Mild symptoms particularly when talking but denies DOE. Minimal secretions noted only in the morning upon arising  Cardiovascular:  Negative for chest pain and leg swelling.  Gastrointestinal:  Negative for constipation, nausea and vomiting.  Genitourinary:  Negative for dysuria and hematuria.  Musculoskeletal:        Knees and back sore due to increasing mobility/working with therapy  Neurological:  Negative for dizziness and headaches.  Psychiatric/Behavioral:  Negative for depression. The patient is not nervous/anxious.    Past Medical History:  Diagnosis Date   DVT (deep vein thrombosis) in pregnancy    RLE DVT 02/2016   Dyspnea    Morbid obesity (HCC)    PE (pulmonary embolism) 02/29/2016   Sleep apnea    uses a cpap   Past Surgical History:  Procedure Laterality Date   CESAREAN SECTION     x 3   OPEN REDUCTION INTERNAL FIXATION (ORIF) DISTAL RADIAL FRACTURE Right 06/20/2019   Procedure: OPEN REDUCTION INTERNAL FIXATION (ORIF) DISTAL RADIAL FRACTURE;  Surgeon: [name], MD;  Location: [facility];  Service: Orthopedics;  Laterality: Right;  REGIONAL BLOCK TOTAL SURGERY  TIME: 90 MINUTES   SYNOVECTOMY Right 11/21/2019   Procedure: RIGHT WRIST LIGAMENT RECONSTRUCTION;  Surgeon: [name], MD;  Location: [facility];  Service: Orthopedics;  Laterality: Right;  PROCEDURE: RIGHT WRIST LIGAMENT  RECONSTRUCTION LENGTH OF SURGERY: 105 MIN   Family History  Problem Relation Age of Onset   Hypertension Sister    Social History:  reports that she has quit smoking. She has never used smokeless tobacco. She reports that she does not drink alcohol and does not use drugs. Allergies: No Known Allergies Medications Prior to Admission  Medication Sig Dispense Refill   acetaminophen (TYLENOL) 650 MG CR tablet Take 1,300 mg by mouth every 8 (eight) hours as needed for pain or fever.     albuterol (VENTOLIN HFA) 108 (90 Base) MCG/ACT inhaler Inhale 2 puffs into the lungs every 4 (four) hours as needed for wheezing or shortness of breath. 18 g 0   amLODipine (NORVASC) 5 MG tablet TAKE 1 TABLET (5 MG TOTAL) BY MOUTH DAILY. 90 tablet 0   apixaban (ELIQUIS) 5 MG TABS tablet Take 1 tablet (5 mg total) by mouth 2 (two) times daily. 180 tablet 3   atorvastatin (LIPITOR) 20 MG tablet TAKE 1 TABLET BY MOUTH EVERY DAY (Patient taking differently: Take 20 mg by mouth daily.) 90 tablet 0   Blood Pressure Monitor DEVI Please provide with medically approved xl blood pressure monitoring device. ICD I10.0 1 each 0   furosemide (LASIX) 40 MG tablet TAKE 1 TABLET BY MOUTH EVERY DAY (Patient taking differently: Take 40 mg by mouth daily.) 90 tablet 0   Misc. Devices MISC Please provide insurance approved CPAP nasal mask. ICD 10 G47.33 Z99.89 1 each 0   PRESCRIPTION MEDICATION 1 each by Other route at bedtime. CPAP- At bedtime        Home: Home Living Family/patient expects to be discharged to:: Private residence Living Arrangements: Alone Available Help at Discharge: Family, Available PRN/intermittently, Other (Comment) (son and niece) Type of Home: House Home Access: Stairs to enter Technical brewer of Steps: 1 Home Layout: Able to live on main level with bedroom/bathroom Bathroom Shower/Tub: Multimedia programmer: Handicapped height Bathroom Accessibility: Yes Home Equipment: Chartered certified accountant (2 wheels), BSC/3in1, Shower seat Additional Comments: reports a son and niece can assist at d.c  Lives With: Alone   Functional History: Prior Function Prior Level of Function : Independent/Modified Independent Mobility Comments: Independent, drives. working ADLs Comments: independent  Functional Status:  Mobility: Bed Mobility Overal bed mobility: Needs Assistance Bed Mobility: Sit to Supine Rolling: Min assist Supine to sit: Min assist, HOB elevated Sit to supine: Mod assist General bed mobility comments: Mod assistance to lift B LEs back to bed without significant assistance Transfers Overall transfer level: Needs assistance Equipment used: None Transfers: Sit to/from Stand Sit to Stand: Supervision Bed to/from chair/wheelchair/BSC transfer type:: Stand pivot Stand pivot transfers: Min assist Step pivot transfers: Min guard Transfer via Lift Equipment: [place] transfer comment: Supervision for safety rising into standing.  Poor eccentric load to lower seated surface. Ambulation/Gait Ambulation/Gait assistance: Min guard Gait Distance (Feet): 100 Feet (x2 + 70 ft, required rest breaks inbetween trials due to DOE and desaturation to mid 80s.) Assistive device: Rolling walker (2 wheels), None Gait Pattern/deviations: Wide base of support, Decreased stride length General Gait Details: Pt continues to improve focusing on gt training without device. Intermittent use of rail and frequent rest breaks due to DOE and decreased functional capacity.  May benefit from a bariatric rollator  to improve energy conservation. Gait velocity: decreased Gait velocity interpretation: <1.31 ft/sec, indicative of household ambulator Pre-gait activities: standing hip flexion x10 reps (limited due to pt loose stools) Stairs: Yes Stairs assistance: Min assist Stair Management: Two rails, Step to pattern, Forwards Number of Stairs: 1 General stair comments: Increased time and effort  continues to need ~ 3 min of times standing at stair before she is able to negotiate the stair.  Pt slow and guarded.    ADL: ADL Overall ADL's : Needs assistance/impaired Eating/Feeding: Independent, Sitting Eating/Feeding Details (indicate cue type and reason): able to take small sips of water Grooming: Wash/dry hands, Supervision/safety, Standing Grooming Details (indicate cue type and reason): Pt walked to sink to groom in standing. Stood at sink for appx 1 minute with supervision. Upper Body Bathing: Minimal assistance, Sitting Upper Body Bathing Details (indicate cue type and reason): pt able to participate in front, max A for back Lower Body Bathing: Moderate assistance, Sit to/from stand Lower Body Bathing Details (indicate cue type and reason): pt would benefit from long sponge at home to assist wtih reaching all areas due to obesity. Upper Body Dressing : Minimal assistance, Sitting Upper Body Dressing Details (indicate cue type and reason): changed gown Lower Body Dressing: Moderate assistance, Sit to/from stand, Cueing for compensatory techniques Lower Body Dressing Details (indicate cue type and reason): Pt has reacher and sock aid at home which will allow pt to be much more independent. Toilet Transfer: Minimal assistance, Ambulation, Rolling walker (2 wheels), Comfort height toilet, Grab bars Toilet Transfer Details (indicate cue type and reason): required assistance to stand from toilet Toileting- Clothing Manipulation and Hygiene: Min guard, Sitting/lateral lean Functional mobility during ADLs: Minimal assistance, Rolling walker (2 wheels) General ADL Comments: able to sit on toilet with min guard and min assist to stand  Cognition: Cognition Overall Cognitive Status: Within Functional Limits for tasks assessed Orientation Level: Oriented X4 Cognition Arousal/Alertness: Awake/alert Behavior During Therapy: WFL for tasks assessed/performed Overall Cognitive Status:  Within Functional Limits for tasks assessed General Comments: Motivated with great participation, pt reports she is eager to DC and a bit frustrated with placement delays due to her weight. Difficult to assess due to: Tracheostomy  Physical Exam: Blood pressure 113/82, pulse 61, temperature 98.3 F (36.8 C), temperature source Oral, resp. rate 16, height 5' 7" (1.702 m), weight (!) 180.1 kg, last menstrual period 11/29/2015, SpO2 94 %. Physical Exam Constitutional:      General: She is not in acute distress.    Appearance: She is obese. She is not ill-appearing.  HENT:     Head: Normocephalic and atraumatic.     Comments: [name] site clean without secretions Eyes:     Extraocular Movements: Extraocular movements intact.     Pupils: Pupils are equal, round, and reactive to light.  Cardiovascular:     Rate and Rhythm: Normal rate and regular rhythm.  Pulmonary:     Effort: Pulmonary effort is normal.     Breath sounds: Normal breath sounds.  Musculoskeletal:     Right lower leg: No edema.     Left lower leg: No edema.  Skin:    General: Skin is warm and dry.  Neurological:     General: No focal deficit present.     Mental Status: She is alert and oriented to person, place, and time.  Psychiatric:        Mood and Affect: Mood normal.        Behavior: Behavior normal.  No results found for this or any previous visit (from the past 48 hour(s)). No results found.    Blood pressure 113/82, pulse 61, temperature 98.3 F (36.8 C), temperature source Oral, resp. rate 16, height 5' 7" (1.702 m), weight (!) 180.1 kg, last menstrual period 11/29/2015, SpO2 94 %.  Medical Problem List and Plan: 1. Functional deficits secondary to acute on chronic respiratory failure  -patient may *** shower  -ELOS/Goals: *** 2.  Antithrombotics: -DVT/anticoagulation:  Pharmaceutical: Other (comment)Eliquis  -antiplatelet therapy: none 3. Pain Management: Tylenol as needed 4. Mood/Sleep: LCSW  to evaluate and provide emotional support  -antipsychotic agents: n/a 5. Neuropsych/cognition: This patient is capable of making decisions on her own behalf. 6. Skin/Wound Care: Routine skin care checks 7. Fluids/Electrolytes/Nutrition: Strict Is and Os  -tolerating diet and does not want supplements 8: Acute on chronic heart failure with preserved ejection fraction -continue Farxiga 10 mg daily -continue Aldactone 25 mg daily -continue Lasix 40 mg daily -daily weight -strict Is and Os 9: Tracheostomy: Trach care per protocol -PCCM will follow-up weekly -PMV as tolerated  -not a candidate for decannulation 10: Hypertension: monitor  11: Obstructive sleep apnea/OHS: nocturnal trach collar with humidity  -nocturnal O2 12: Morbid obesity: dietary counseling 13: Recurrent PE: continue Eliquis 14: Anxiety: resolved; clonazepam prn 15: Hyperlipidemia: continue Lipitor 20 mg daily     ***  Barbie Banner, PA-C 01/08/2022

## 2022-01-08 NOTE — Discharge Summary (Signed)
Physician Discharge Summary  Angel Brewer LEX:[id number] DOB: 04/02/1966 DOA: 11/21/2021  PCP: [name], NP  Admit date: 11/21/2021 Discharge date: 01/08/2022  Admitted From: Home Disposition: Acute inpatient rehab  Recommendations for Outpatient Follow-up:  Schedule follow-up with pulmonary after discharge from rehab.  Home Health: N/A Equipment/Devices: N/A  Discharge Condition: Stable CODE STATUS: Full code Diet recommendation: Low-salt diet  Discharge summary: Patient is going to acute inpatient rehab today after 47 days staying in the hospital.  Stable for discharge.  Her discharge summary is as below:  11yow PMH HFpEF (last EF 60-65% October 2022), recurrent PE on Eliquis, pulm HTN,  OSA/OHS on CPAP, obesity class 3 who reported 2 days of worsening dyspnea, orthopnea and cough. Admitted with the working diagnosis of acute on chronic diastolic heart failure, complicated with acute hypoxemic respiratory failure.  Extensive hospitalization with procedures.  Initially started on BiPAP and diuresed, increased somnolence and hypercarbia needing intubation, failed SBT trial and ultimately underwent tracheostomy placement on 5/19.  She remained in the hospital waiting for disposition and did very good clinical improvement.  Currently has trach collar in place.  Treated for following conditions.   Acute hypoxemic and hypercapnic respiratory failure due to acute cardiogenic pulmonary edema due to acute on chronic HFpEF and OHS: --acute issues long resolved --s/p tracheostomy 5/19 > ATC 5/24 --Continue trach care per PCCM weekly and PRN. Not likely to decannulate in the interim given her use of trilogy at home, body habitus and history of profound respiratory failure. --on room air, pulmonary will follow-up.   Acute on chronic HFpEF, HTN: Echocardiogram with preserved LV systolic function 60 to 44%, RV systolic function is preserved, no significant valvular disease.  --stable.  Continue furosemide, spironolactone and dapagliflozin.  --follow BMP monthly   OHS, OSA:  --Weight loss recommended, encouraged to mobilize as much as possible to avoid hypoventilation/atelectasis.    Morbid obesity: Body mass index is 62.19 kg/m.  --consider outpatient weight loss specialist or bariatric surgery   PMH recurrent PE --Continue apixaban.   Anxiety:  --appears stable. Continue clonazepam low dose scheduled   Hyperlipidemia --Continue atorvastatin   Acute metabolic encephalopathy: Resolved.   Medically stable to transfer to acute inpatient rehab.       Discharge Diagnoses:  Active Problems:   Obesity hypoventilation syndrome (HCC)   History of pulmonary embolus (PE)   Essential hypertension   Hyperlipidemia   Class 3 obesity (HCC)   Chronic respiratory failure (HCC)   OSA (obstructive sleep apnea)   Chronic diastolic CHF (congestive heart failure) ([place])   Tracheostomy present [facility])    Discharge Instructions  Discharge Instructions     Diet general   Complete by: As directed    Increase activity slowly   Complete by: As directed    No wound care   Complete by: As directed       Allergies as of 01/08/2022   No Known Allergies      Medication List     STOP taking these medications    amLODipine 5 MG tablet Commonly known as: NORVASC   Blood Pressure Monitor Devi   Misc. Devices Misc       TAKE these medications    acetaminophen 650 MG CR tablet Commonly known as: TYLENOL Take 1,300 mg by mouth every 8 (eight) hours as needed for pain or fever.   albuterol 108 (90 Base) MCG/ACT inhaler Commonly known as: VENTOLIN HFA Inhale 2 puffs into the lungs every 4 (four) hours as needed  for wheezing or shortness of breath.   apixaban 5 MG Tabs tablet Commonly known as: ELIQUIS Take 1 tablet (5 mg total) by mouth 2 (two) times daily.   atorvastatin 20 MG tablet Commonly known as: LIPITOR TAKE 1 TABLET BY MOUTH EVERY DAY    clonazePAM 0.25 MG disintegrating tablet Commonly known as: KLONOPIN Take 1 tablet (0.25 mg total) by mouth 2 (two) times daily.   dapagliflozin propanediol 10 MG Tabs tablet Commonly known as: FARXIGA Take 1 tablet (10 mg total) by mouth daily.   dicyclomine 10 MG capsule Commonly known as: BENTYL Take 1 capsule (10 mg total) by mouth 2 (two) times daily as needed for spasms.   furosemide 40 MG tablet Commonly known as: LASIX TAKE 1 TABLET BY MOUTH EVERY DAY   multivitamin with minerals Tabs tablet Take 1 tablet by mouth daily.   ondansetron 4 MG tablet Commonly known as: ZOFRAN Take 1 tablet (4 mg total) by mouth every 6 (six) hours as needed for nausea.   pantoprazole 40 MG tablet Commonly known as: PROTONIX Take 1 tablet (40 mg total) by mouth daily.   polyethylene glycol powder 17 GM/SCOOP powder Commonly known as: GLYCOLAX/MIRALAX Take 1 capful (17 g) by mouth daily as needed for mild constipation.   PRESCRIPTION MEDICATION 1 each by Other route at bedtime. CPAP- At bedtime   spironolactone 25 MG tablet Commonly known as: ALDACTONE Take 1 tablet (25 mg total) by mouth daily.        No Known Allergies  Consultations: Multiple   Procedures/Studies: No results found. (Echo, Carotid, EGD, Colonoscopy, ERCP)    Subjective: Patient seen and examined.  She is very excited to go to rehab today.  Has some back pain on walking but denies any other complaints.   Discharge Exam: Vitals:   01/08/22 0815 01/08/22 1134  BP: 113/82   Pulse: 61 84  Resp: 16 18  Temp: 98.3 F (36.8 C)   SpO2: 94% 94%   Vitals:   01/08/22 0310 01/08/22 0750 01/08/22 0815 01/08/22 1134  BP:   113/82   Pulse: 72 63 61 84  Resp: _0 Temp:   98.3 F (36.8 C)   TempSrc:   Oral   SpO2: 92% 93% 94% 94%  Weight:      Height:        General: Pt is alert, awake, not in acute distress Trach collar.  On room air.  Speaking in full sentences. Cardiovascular: RRR,  S1/S2 +, no rubs, no gallops Respiratory: CTA bilaterally, no wheezing, no rhonchi Abdominal: Soft, NT, ND, bowel sounds +, obese and pendulous. Extremities: no edema, no cyanosis    The results of significant diagnostics from this hospitalization (including imaging, microbiology, ancillary and laboratory) are listed below for reference.     Microbiology: No results found for this or any previous visit (from the past 240 hour(s)).   Labs: BNP (last 3 results) Recent Labs    04/30/21 1821 11/21/21 1827  BNP 85.2 119.1*   Basic Metabolic Panel: No results for input(s): "NA", "K", "CL", "CO2", "GLUCOSE", "BUN", "CREATININE", "CALCIUM", "MG", "PHOS" in the last 168 hours. Liver Function Tests: No results for input(s): "AST", "ALT", "ALKPHOS", "BILITOT", "PROT", "ALBUMIN" in the last 168 hours. No results for input(s): "LIPASE", "AMYLASE" in the last 168 hours. No results for input(s): "AMMONIA" in the last 168 hours. CBC: No results for input(s): "WBC", "NEUTROABS", "HGB", "HCT", "MCV", "PLT" in the last 168 hours. Cardiac Enzymes: No results  for input(s): "CKTOTAL", "CKMB", "CKMBINDEX", "TROPONINI" in the last 168 hours. BNP: Invalid input(s): "POCBNP" CBG: No results for input(s): "GLUCAP" in the last 168 hours. D-Dimer No results for input(s): "DDIMER" in the last 72 hours. Hgb A1c No results for input(s): "HGBA1C" in the last 72 hours. Lipid Profile No results for input(s): "CHOL", "HDL", "LDLCALC", "TRIG", "CHOLHDL", "LDLDIRECT" in the last 72 hours. Thyroid function studies No results for input(s): "TSH", "T4TOTAL", "T3FREE", "THYROIDAB" in the last 72 hours.  Invalid input(s): "FREET3" Anemia work up No results for input(s): "VITAMINB12", "FOLATE", "FERRITIN", "TIBC", "IRON", "RETICCTPCT" in the last 72 hours. Urinalysis    Component Value Date/Time   COLORURINE AMBER (A) 11/23/2019 0220   APPEARANCEUR CLOUDY (A) 11/23/2019 0220   LABSPEC 1.027 11/23/2019 0220    PHURINE 5.0 11/23/2019 0220   GLUCOSEU NEGATIVE 11/23/2019 0220   HGBUR NEGATIVE 11/23/2019 0220   BILIRUBINUR NEGATIVE 11/23/2019 0220   KETONESUR NEGATIVE 11/23/2019 0220   PROTEINUR 100 (A) 11/23/2019 0220   NITRITE NEGATIVE 11/23/2019 0220   LEUKOCYTESUR NEGATIVE 11/23/2019 0220   Sepsis Labs No results for input(s): "WBC" in the last 168 hours.  Invalid input(s): "PROCALCITONIN", "LACTICIDVEN" Microbiology No results found for this or any previous visit (from the past 240 hour(s)).   Time coordinating discharge: 32 minutes  SIGNED:   Barb Merino, MD  [facility] 01/08/2022, 12:03 PM

## 2022-01-08 NOTE — Progress Notes (Signed)
Patient seen and examined. Stable to discharge to CIR. See dc summary

## 2022-01-08 NOTE — Progress Notes (Signed)
Inpatient Rehab Admissions Coordinator:    I have a CIR bed for this Pt. Today. Rn may call report to [phone].  Clemens Catholic, [place], [place] Admissions Coordinator  [phone] ([place]) [phone] (office)

## 2022-01-08 NOTE — H&P (Signed)
Physical Medicine and Rehabilitation Admission H&P     CC: Debility secondary to heart and respiratory failure   HPI: Angel Brewer is a 56 year old female who presented to the emergency department on 11/21/2021 with worsening shortness of breath, exertional dyspnea, orthopnea and nonproductive cough.  She has past medical history of heart failure with preserved ejection fraction, history of recurrent PE on Eliquis and pulmonary hypertension.  She also has a history of morbid obesity with obstructive sleep apnea/obesity related hypoventilation syndrome on CPAP trilogy at home.  Chest x-ray consistent with increased interstitial lung markings/pulmonary edema and she was given Lasix 60 mg IV.  Heparin infusion. Her initial oxygen saturation was 60% on room air and on admission was requiring 4 L O2 via nasal cannula to maintain her SPO2 greater than 92%.  She had not been requiring supplemental oxygen at home.  COVID-negative.  Admitted for further evaluation and observation.  Updated echocardiogram with ejection fraction of 60 to 65%.  BiPAP used nightly.  Worsening respiratory status with somnolence and hypercarbia on 5/13 and ABGs obtained.  Critical care medicine/pulmonology consulted. Transferred to ICU, intubated on 5/15. Tracheostomy by Dr. Tacy Learn 5/19 after failed SBTs.  Placed on trach collar 5/24.  Transferred to hospitalist service on 5/27.  Started dysphagia 3 diet on 5/23 and was tolerating this therefore tube feeds and nasogastric tube were discontinued. She was later upgraded to regular/thin diet. Diuresis continued.  Using Passy-Muir valve and followed by pulmonary/critical care medicine weekly and as needed.  Felt not likely to decannulate given her use of Trilogy at home.  PT/OT evaluations carried out and mobility initiated.  Weaning from oxygen supplementation continued.  Daytime oxygen saturations currently stable on room air.  Follow-up by PCCM on 6/27.  Nocturnal hypoxemia noted and  recommend trach collar with O2/humidity at night.  Trach care per protocol.  Will need outpatient trach clinic follow-up with [name], NP at discharge.  At this point, the patient is not a candidate for decannulation previously using trilogy at home.  Passy-Muir valve as tolerated. The patient requires inpatient physical medicine and rehabilitation evaluations and treatment secondary to dysfunction due to respiratory failure, heart failure, morbid obesity. She reports her shortness of breath is much improved. She reports she has chronic knee pain due to OA and Voltaren gel helped this pain in the past. She reports mild lower back pain due to resumption of PT, this pain is improved this tylenol.    Review of Systems  Constitutional:  Negative for chills and fever.  HENT:  Negative for hearing loss and sore throat.   Respiratory:  Positive for cough and shortness of breath.        Mild symptoms particularly when talking but denies DOE. Minimal secretions noted only in the morning upon arising  Cardiovascular:  Negative for chest pain and leg swelling.  Gastrointestinal:  Negative for constipation, nausea and vomiting.  Genitourinary:  Negative for dysuria and hematuria.  Musculoskeletal:        Knees and back sore due to increasing mobility/working with therapy  Neurological:  Negative for dizziness and headaches.  Psychiatric/Behavioral:  Negative for depression. The patient is not nervous/anxious.         Past Medical History:  Diagnosis Date   DVT (deep vein thrombosis) in pregnancy      RLE DVT 02/2016   Dyspnea     Morbid obesity (HCC)     PE (pulmonary embolism) 02/29/2016   Sleep apnea  uses a cpap         Past Surgical History:  Procedure Laterality Date   CESAREAN SECTION        x 3   OPEN REDUCTION INTERNAL FIXATION (ORIF) DISTAL RADIAL FRACTURE Right 06/20/2019    Procedure: OPEN REDUCTION INTERNAL FIXATION (ORIF) DISTAL RADIAL FRACTURE;  Surgeon: [name], MD;   Location: [facility] [facility];  Service: Orthopedics;  Laterality: Right;  REGIONAL BLOCK TOTAL SURGERY TIME: 90 MINUTES   SYNOVECTOMY Right 11/21/2019    Procedure: RIGHT WRIST LIGAMENT RECONSTRUCTION;  Surgeon: [name], MD;  Location: [place] [facility];  Service: Orthopedics;  Laterality: Right;  PROCEDURE: RIGHT WRIST LIGAMENT RECONSTRUCTION LENGTH OF SURGERY: 105 MIN         Family History  Problem Relation Age of Onset   Hypertension Sister      Social History:  reports that she has quit smoking. She has never used smokeless tobacco. She reports that she does not drink alcohol and does not use drugs. Allergies: No Known Allergies       Medications Prior to Admission  Medication Sig Dispense Refill   acetaminophen (TYLENOL) 650 MG CR tablet Take 1,300 mg by mouth every 8 (eight) hours as needed for pain or fever.       albuterol (VENTOLIN HFA) 108 (90 Base) MCG/ACT inhaler Inhale 2 puffs into the lungs every 4 (four) hours as needed for wheezing or shortness of breath. 18 g 0   amLODipine (NORVASC) 5 MG tablet TAKE 1 TABLET (5 MG TOTAL) BY MOUTH DAILY. 90 tablet 0   apixaban (ELIQUIS) 5 MG TABS tablet Take 1 tablet (5 mg total) by mouth 2 (two) times daily. 180 tablet 3   atorvastatin (LIPITOR) 20 MG tablet TAKE 1 TABLET BY MOUTH EVERY DAY (Patient taking differently: Take 20 mg by mouth daily.) 90 tablet 0   Blood Pressure Monitor DEVI Please provide with medically approved xl blood pressure monitoring device. ICD I10.0 1 each 0   furosemide (LASIX) 40 MG tablet TAKE 1 TABLET BY MOUTH EVERY DAY (Patient taking differently: Take 40 mg by mouth daily.) 90 tablet 0   Misc. Devices MISC Please provide insurance approved CPAP nasal mask. ICD 10 G47.33 Z99.89 1 each 0   PRESCRIPTION MEDICATION 1 each by Other route at bedtime. CPAP- At bedtime              Home: Home Living Family/patient expects to be discharged to:: Private residence Living Arrangements: Alone Available Help at Discharge: Family,  Available PRN/intermittently, Other (Comment) (son and niece) Type of Home: House Home Access: Stairs to enter Technical brewer of Steps: 1 Home Layout: Able to live on main level with bedroom/bathroom Bathroom Shower/Tub: Multimedia programmer: Handicapped height Bathroom Accessibility: Yes Home Equipment: Conservation officer, nature (2 wheels), BSC/3in1, Shower seat Additional Comments: reports a son and niece can assist at d.c  Lives With: Alone   Functional History: Prior Function Prior Level of Function : Independent/Modified Independent Mobility Comments: Independent, drives. working ADLs Comments: independent   Functional Status:  Mobility: Bed Mobility Overal bed mobility: Needs Assistance Bed Mobility: Sit to Supine Rolling: Min assist Supine to sit: Min assist, HOB elevated Sit to supine: Mod assist General bed mobility comments: Mod assistance to lift B LEs back to bed without significant assistance Transfers Overall transfer level: Needs assistance Equipment used: None Transfers: Sit to/from Stand Sit to Stand: Supervision Bed to/from chair/wheelchair/BSC transfer type:: Stand pivot Stand pivot transfers: Min assist Step pivot transfers: Min guard Transfer  via Lift Equipment: [place] transfer comment: Supervision for safety rising into standing.  Poor eccentric load to lower seated surface. Ambulation/Gait Ambulation/Gait assistance: Min guard Gait Distance (Feet): 100 Feet (x2 + 70 ft, required rest breaks inbetween trials due to DOE and desaturation to mid 80s.) Assistive device: Rolling walker (2 wheels), None Gait Pattern/deviations: Wide base of support, Decreased stride length General Gait Details: Pt continues to improve focusing on gt training without device. Intermittent use of rail and frequent rest breaks due to DOE and decreased functional capacity.  May benefit from a bariatric rollator to improve energy conservation. Gait velocity:  decreased Gait velocity interpretation: <1.31 ft/sec, indicative of household ambulator Pre-gait activities: standing hip flexion x10 reps (limited due to pt loose stools) Stairs: Yes Stairs assistance: Min assist Stair Management: Two rails, Step to pattern, Forwards Number of Stairs: 1 General stair comments: Increased time and effort continues to need ~ 3 min of times standing at stair before she is able to negotiate the stair.  Pt slow and guarded.   ADL: ADL Overall ADL's : Needs assistance/impaired Eating/Feeding: Independent, Sitting Eating/Feeding Details (indicate cue type and reason): able to take small sips of water Grooming: Wash/dry hands, Supervision/safety, Standing Grooming Details (indicate cue type and reason): Pt walked to sink to groom in standing. Stood at sink for appx 1 minute with supervision. Upper Body Bathing: Minimal assistance, Sitting Upper Body Bathing Details (indicate cue type and reason): pt able to participate in front, max A for back Lower Body Bathing: Moderate assistance, Sit to/from stand Lower Body Bathing Details (indicate cue type and reason): pt would benefit from long sponge at home to assist wtih reaching all areas due to obesity. Upper Body Dressing : Minimal assistance, Sitting Upper Body Dressing Details (indicate cue type and reason): changed gown Lower Body Dressing: Moderate assistance, Sit to/from stand, Cueing for compensatory techniques Lower Body Dressing Details (indicate cue type and reason): Pt has reacher and sock aid at home which will allow pt to be much more independent. Toilet Transfer: Minimal assistance, Ambulation, Rolling walker (2 wheels), Comfort height toilet, Grab bars Toilet Transfer Details (indicate cue type and reason): required assistance to stand from toilet Toileting- Clothing Manipulation and Hygiene: Min guard, Sitting/lateral lean Functional mobility during ADLs: Minimal assistance, Rolling walker (2  wheels) General ADL Comments: able to sit on toilet with min guard and min assist to stand   Cognition: Cognition Overall Cognitive Status: Within Functional Limits for tasks assessed Orientation Level: Oriented X4 Cognition Arousal/Alertness: Awake/alert Behavior During Therapy: WFL for tasks assessed/performed Overall Cognitive Status: Within Functional Limits for tasks assessed General Comments: Motivated with great participation, pt reports she is eager to DC and a bit frustrated with placement delays due to her weight. Difficult to assess due to: Tracheostomy   Physical Exam: Blood pressure 113/82, pulse 61, temperature 98.3 F (36.8 C), temperature source Oral, resp. rate 16, height _0  (1.702 m), weight (!) 180.1 kg, last menstrual period 11/29/2015, SpO2 94 %.   General: Alert and oriented x 3, No apparent distress, sitting in chair HEENT: Head is normocephalic, atraumatic, PERRLA, EOMI, sclera anicteric, oral mucosa pink and moist, Trach site clean without secretions Neck: Supple without JVD or lymphadenopathy Heart: Reg rate and rhythm. No murmurs rubs or gallops Chest: CTA bilaterally without wheezes, rales, or rhonchi; no distress Abdomen: Soft, non-tender, non-distended, bowel sounds positive, obese Extremities: No clubbing edema or cyanosis. Pulses are 2+ Psych: Pt's affect is appropriate. Pt is cooperative Skin: Clean  and intact without signs of breakdown Neuro:  CN 2-12 intact, follow commands, Sensation intact in all 4 extremities, DTR normal and symmetric Strength 5/5 in all b/l UE Strength 4/5 in proximal b/l LE and 5/5 in distal b/l LE FTN intact Musculoskeletal: minimal knee tenderness to palpation No joint swelling noted Mild lumbar paraspinal tenderness  No results found for this or any previous visit (from the past 48 hour(s)). No results found.    Last menstrual period 11/29/2015.  Medical Problem List and Plan: 1. Functional deficits secondary  to acute on chronic respiratory failure  -patient may shower  -ELOS/Goals: 5-7 days, PT/OT mod I  -Admit to CIR 2.  Antithrombotics: -DVT/anticoagulation:  Pharmaceutical: Other (comment)Eliquis  -antiplatelet therapy: none 3. Pain Management: Tylenol as needed, voltaren gel 4. Mood/Sleep: LCSW to evaluate and provide emotional support  -antipsychotic agents: n/a 5. Neuropsych/cognition: This patient is capable of making decisions on her own behalf. 6. Skin/Wound Care: Routine skin care checks 7. Fluids/Electrolytes/Nutrition: Strict Is and Os  -tolerating diet and does not want supplements 8: Acute on chronic heart failure with preserved ejection fraction -continue Farxiga 10 mg daily -continue Aldactone 25 mg daily -continue Lasix 40 mg daily -daily weight -strict Is and Os -follow BMP 9: Tracheostomy: Trach care per protocol -PCCM will follow-up weekly -PMV as tolerated  -not a candidate for decannulation 10: Hypertension: monitor  -BP has been well controlled overall 11: Obstructive sleep apnea/OHS: nocturnal trach collar with humidity  -nocturnal O2 12: Morbid obesity: dietary counseling 13: Recurrent PE: continue Eliquis 47m BID 14: Anxiety: resolved; clonazepam prn 15: Hyperlipidemia: continue Lipitor 20 mg daily, counseling regarding diet 16. Knee pain suspected chronic OA -Voltaren gel ordered  I have personally performed a face to face diagnostic evaluation of this patient and formulated the key components of the plan.  Additionally, I have personally reviewed laboratory data, imaging studies, as well as relevant notes and concur with the physician assistant's documentation above.  The patient's status has not changed from the original H&P.  Any changes in documentation from the acute care chart have been noted above.  YJennye Boroughs MD, [facility]  YJennye Boroughs MD 01/08/2022

## 2022-01-08 NOTE — Progress Notes (Signed)
Inpatient Rehabilitation Discharge Medication Review by a Pharmacist  A complete drug regimen review was completed for this patient to identify any potential clinically significant medication issues.  High Risk Drug Classes Is patient taking? Indication by Medication  Antipsychotic Yes Compazine - prn N/V  Anticoagulant Yes Apixaban - recurrent PE  Antibiotic No   Opioid No   Antiplatelet No   Hypoglycemics/insulin Yes Dapagliflozin - DM  Vasoactive Medication Yes Furosemide, Spironolactone - fluid  Chemotherapy No   Other Yes Clonazepam - anxiety Atorvastatin - HLD Pantoprazole - GERD     Type of Medication Issue Identified Description of Issue Recommendation(s)  Drug Interaction(s) (clinically significant)     Duplicate Therapy     Allergy     No Medication Administration End Date     Incorrect Dose     Additional Drug Therapy Needed     Significant med changes from prior encounter (inform family/care partners about these prior to discharge).    Other       Clinically significant medication issues were identified that warrant physician communication and completion of prescribed/recommended actions by midnight of the next day:  No  Pharmacist comments: None  Time spent performing this drug regimen review (minutes):  20 minutes   Tad Moore 01/08/2022 1:33 PM

## 2022-01-09 DIAGNOSIS — J9620 Acute and chronic respiratory failure, unspecified whether with hypoxia or hypercapnia: Secondary | ICD-10-CM | POA: Diagnosis not present

## 2022-01-09 DIAGNOSIS — E876 Hypokalemia: Secondary | ICD-10-CM

## 2022-01-09 DIAGNOSIS — G4733 Obstructive sleep apnea (adult) (pediatric): Secondary | ICD-10-CM | POA: Diagnosis not present

## 2022-01-09 DIAGNOSIS — E785 Hyperlipidemia, unspecified: Secondary | ICD-10-CM | POA: Diagnosis not present

## 2022-01-09 LAB — CBC WITH DIFFERENTIAL/PLATELET
Abs Immature Granulocytes: 0.02 10*3/uL (ref 0.00–0.07)
Basophils Absolute: 0.1 10*3/uL (ref 0.0–0.1)
Basophils Relative: 1 %
Eosinophils Absolute: 0.3 10*3/uL (ref 0.0–0.5)
Eosinophils Relative: 8 %
HCT: 36.1 % (ref 36.0–46.0)
Hemoglobin: 11.3 g/dL — ABNORMAL LOW (ref 12.0–15.0)
Immature Granulocytes: 1 %
Lymphocytes Relative: 30 %
Lymphs Abs: 1.3 10*3/uL (ref 0.7–4.0)
MCH: 28.9 pg (ref 26.0–34.0)
MCHC: 31.3 g/dL (ref 30.0–36.0)
MCV: 92.3 fL (ref 80.0–100.0)
Monocytes Absolute: 0.4 10*3/uL (ref 0.1–1.0)
Monocytes Relative: 10 %
Neutro Abs: 2.2 10*3/uL (ref 1.7–7.7)
Neutrophils Relative %: 50 %
Platelets: 213 10*3/uL (ref 150–400)
RBC: 3.91 MIL/uL (ref 3.87–5.11)
RDW: 14.8 % (ref 11.5–15.5)
WBC: 4.3 10*3/uL (ref 4.0–10.5)
nRBC: 0 % (ref 0.0–0.2)

## 2022-01-09 LAB — VITAMIN D 25 HYDROXY (VIT D DEFICIENCY, FRACTURES): Vit D, 25-Hydroxy: 31.57 ng/mL (ref 30–100)

## 2022-01-09 LAB — COMPREHENSIVE METABOLIC PANEL
ALT: 12 U/L (ref 0–44)
AST: 14 U/L — ABNORMAL LOW (ref 15–41)
Albumin: 2.8 g/dL — ABNORMAL LOW (ref 3.5–5.0)
Alkaline Phosphatase: 42 U/L (ref 38–126)
Anion gap: 6 (ref 5–15)
BUN: 11 mg/dL (ref 6–20)
CO2: 31 mmol/L (ref 22–32)
Calcium: 9 mg/dL (ref 8.9–10.3)
Chloride: 104 mmol/L (ref 98–111)
Creatinine, Ser: 0.94 mg/dL (ref 0.44–1.00)
GFR, Estimated: >60 mL/min (ref >60)
Glucose, Bld: 91 mg/dL (ref 70–99)
Potassium: 3.2 mmol/L — ABNORMAL LOW (ref 3.5–5.1)
Sodium: 141 mmol/L (ref 135–145)
Total Bilirubin: 0.7 mg/dL (ref 0.3–1.2)
Total Protein: 6.6 g/dL (ref 6.5–8.1)

## 2022-01-09 NOTE — Evaluation (Signed)
Occupational Therapy Assessment and Plan  Patient Details  Name: Angel Brewer MRN: [id number] Date of Birth: 01-22-1966  OT Diagnosis: abnormal posture, acute pain, muscle weakness (generalized), and swelling of limb Rehab Potential: Rehab Potential (ACUTE ONLY): Good ELOS: 5-7 days   Today's Date: 01/09/2022 OT Individual Time: 4193-7902 & 4097-3532 OT Individual Time Calculation (min): 72 min  & 43 min  Hospital Problem: Principal Problem:   Respiratory failure ([place])   Past Medical History:  Past Medical History:  Diagnosis Date   DVT (deep vein thrombosis) in pregnancy    RLE DVT 02/2016   Dyspnea    Morbid obesity (HCC)    PE (pulmonary embolism) 02/29/2016   Sleep apnea    uses a cpap   Past Surgical History:  Past Surgical History:  Procedure Laterality Date   CESAREAN SECTION     x 3   OPEN REDUCTION INTERNAL FIXATION (ORIF) DISTAL RADIAL FRACTURE Right 06/20/2019   Procedure: OPEN REDUCTION INTERNAL FIXATION (ORIF) DISTAL RADIAL FRACTURE;  Surgeon: [name], MD;  Location: [facility];  Service: Orthopedics;  Laterality: Right;  REGIONAL BLOCK TOTAL SURGERY TIME: 90 MINUTES   SYNOVECTOMY Right 11/21/2019   Procedure: RIGHT WRIST LIGAMENT RECONSTRUCTION;  Surgeon: [name], MD;  Location: [facility];  Service: Orthopedics;  Laterality: Right;  PROCEDURE: RIGHT WRIST LIGAMENT RECONSTRUCTION LENGTH OF SURGERY: 105 MIN    Assessment & Plan Clinical Impression:  Angel Brewer is a 56 year old female who presented to the emergency department on 11/21/2021 with worsening shortness of breath, exertional dyspnea, orthopnea and nonproductive cough.  She has past medical history of heart failure with preserved ejection fraction, history of recurrent PE on Eliquis and pulmonary hypertension.  She also has a history of morbid obesity with obstructive sleep apnea/obesity related hypoventilation syndrome on CPAP trilogy at home.  Chest x-ray consistent with increased interstitial  lung markings/pulmonary edema and she was given Lasix 60 mg IV.  Heparin infusion. Her initial oxygen saturation was 60% on room air and on admission was requiring 4 L O2 via nasal cannula to maintain her SPO2 greater than 92%.  She had not been requiring supplemental oxygen at home.  COVID-negative.  Admitted for further evaluation and observation.  Updated echocardiogram with ejection fraction of 60 to 65%.  BiPAP used nightly.  Worsening respiratory status with somnolence and hypercarbia on 5/13 and ABGs obtained.  Critical care medicine/pulmonology consulted. Transferred to ICU, intubated on 5/15. Tracheostomy by Dr. Tacy Learn 5/19 after failed SBTs.  Placed on trach collar 5/24.  Transferred to hospitalist service on 5/27.  Started dysphagia 3 diet on 5/23 and was tolerating this therefore tube feeds and nasogastric tube were discontinued. She was later upgraded to regular/thin diet. Diuresis continued.  Using Passy-Muir valve and followed by pulmonary/critical care medicine weekly and as needed.  Felt not likely to decannulate given her use of Trilogy at home.  PT/OT evaluations carried out and mobility initiated.  Weaning from oxygen supplementation continued.  Daytime oxygen saturations currently stable on room air.  Follow-up by PCCM on 6/27.  Nocturnal hypoxemia noted and recommend trach collar with O2/humidity at night.  Trach care per protocol.  Will need outpatient trach clinic follow-up with [name], NP at discharge.  At this point, the patient is not a candidate for decannulation previously using trilogy at home.  Passy-Muir valve as tolerated. The patient requires inpatient physical medicine and rehabilitation evaluations and treatment secondary to dysfunction due to respiratory failure, heart failure, morbid obesity. She reports her shortness of breath  is much improved. She reports she has chronic knee pain due to OA and Voltaren gel helped this pain in the past. She reports mild lower back pain due to  resumption of PT, this pain is improved this tylenol. Patient transferred to CIR on 01/08/2022 .    Patient currently requires min with basic self-care skills and IADL secondary to muscle weakness, decreased cardiorespiratoy endurance and decreased oxygen support, and decreased standing balance and decreased postural control.  Prior to hospitalization, patient could complete all self-care independently.  Patient will benefit from skilled intervention to increase independence with basic self-care skills and increase level of independence with iADL prior to discharge home independently.  Anticipate patient will require intermittent supervision/assist for higher level tasks, however ultimately mod I.  OT - End of Session Activity Tolerance: Tolerates < 10 min activity, no significant change in vital signs Endurance Deficit: Yes Endurance Deficit Description: pt required rest breaks after ADL tasks, ambulation and reported mild SOB OT Assessment Rehab Potential (ACUTE ONLY): Good OT Barriers to Discharge: Home environment access/layout;Lack of/limited family support;Weight;Trach OT Patient demonstrates impairments in the following area(s): Balance;Edema;Endurance;Pain;Safety OT Basic ADL's Functional Problem(s): Grooming;Bathing;Dressing;Toileting OT Advanced ADL's Functional Problem(s): Simple Meal Preparation;Laundry OT Transfers Functional Problem(s): Toilet;Tub/Shower OT Additional Impairment(s): None OT Plan OT Intensity: Minimum of 1-2 x/day, 45 to 90 minutes OT Frequency: 5 out of 7 days OT Duration/Estimated Length of Stay: 5-7 days OT Treatment/Interventions: Balance/vestibular training;Discharge planning;Pain management;Self Care/advanced ADL retraining;Therapeutic Activities;Disease mangement/prevention;Functional mobility training;Patient/family education;Therapeutic Exercise;Community reintegration;DME/adaptive equipment instruction;UE/LE Strength taining/ROM OT Self Feeding  Anticipated Outcome(s): Indep OT Basic Self-Care Anticipated Outcome(s): Mod I OT Toileting Anticipated Outcome(s): mod I OT Bathroom Transfers Anticipated Outcome(s): Mod I OT Recommendation Recommendations for Other Services: Therapeutic Recreation consult Therapeutic Recreation Interventions: Pet therapy Patient destination: Home Follow Up Recommendations: None Equipment Recommended: To be determined   OT Evaluation Precautions/Restrictions  Precautions Precautions: Fall Precaution Comments: PMSV, body habitus Restrictions Weight Bearing Restrictions: No Home Living/Prior Functioning Home Living Family/patient expects to be discharged to:: Private residence Living Arrangements: Alone Available Help at Discharge: Family, Available PRN/intermittently, Other (Comment) (pt reports her neice will stay with her for a week upon D/C) Type of Home: House Home Access: Stairs to enter Entrance Stairs-Number of Steps: 1 small step then a threshold. Larger step to enter from garage Entrance Stairs-Rails: None Home Layout: Able to live on main level with bedroom/bathroom, Two level Alternate Level Stairs-Number of Steps: flight Alternate Level Stairs-Rails: Right Bathroom Shower/Tub: Walk-in shower, Door (sliding glass door) Bathroom Toilet: Handicapped height Bathroom Accessibility: Yes Additional Comments: pt reports having bariatric RW, was standing to shower without AE/DME  Lives With: Alone IADL History Homemaking Responsibilities: Yes Meal Prep Responsibility: Primary Homemaking Comments: Pt was independent in all IADLs, would like to return but will have assist for about a week at d/c Current License: Yes Mode of Transportation: Other (comment) (SUV) Occupation: Full time employment Type of Occupation: Scientist, 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 Prior Function Level of Independence: Independent with basic ADLs, Independent with transfers, Independent with  homemaking with ambulation, Independent with gait  Able to Take Stairs?: Yes Driving: Yes Vocation: Full time employment Vocation Requirements: works at [employer] Vision/History: 0 No visual deficits (reports that she's noticed lessening visual acuity at baseline and needs to get readers) Ability to See in Adequate Light: 0 Adequate Patient Visual Report: No change from baseline Vision Assessment?: No apparent visual deficits Perception  Perception: Within Functional Limits Praxis Praxis: Intact Cognition Cognition  Overall Cognitive Status: Within Functional Limits for tasks assessed Arousal/Alertness: Awake/alert Memory: Appears intact Awareness: Appears intact Problem Solving: Appears intact Safety/Judgment: Appears intact Brief Interview for Mental Status (BIMS) Repetition of Three Words (First Attempt): 3 Temporal Orientation: Year: Correct Temporal Orientation: Month: Accurate within 5 days Temporal Orientation: Day: Correct Recall: "Sock": Yes, no cue required Recall: "Blue": Yes, no cue required Recall: "Bed": Yes, no cue required BIMS Summary Score: 15 Sensation Sensation Light Touch: Appears Intact Proprioception: Appears Intact Additional Comments: pt reports tingling along R lateral thighs at baseline Coordination Gross Motor Movements are Fluid and Coordinated: Yes Fine Motor Movements are Fluid and Coordinated: Yes Finger Nose Finger Test: WFL bilaterally but slow Heel Shin Test: unable to perform due to body habitus Motor  Motor Motor: Within Functional Limits Motor - Skilled Clinical Observations: generalized weakness/deconditioning  Trunk/Postural Assessment  Cervical Assessment Cervical Assessment: Within Functional Limits Thoracic Assessment Thoracic Assessment: Within Functional Limits Lumbar Assessment Lumbar Assessment: Exceptions to [facility] (anterior pelvic tilt due to body habitus) Postural Control Postural Control: Within  Functional Limits  Balance Balance Balance Assessed: Yes Static Sitting Balance Static Sitting - Balance Support: Feet supported;Bilateral upper extremity supported Static Sitting - Level of Assistance: 6: Modified independent (Device/Increase time) Static Standing Balance Static Standing - Balance Support: No upper extremity supported;During functional activity Static Standing - Level of Assistance: 5: Stand by assistance (close supervision) Dynamic Standing Balance Dynamic Standing - Balance Support: No upper extremity supported;During functional activity Dynamic Standing - Level of Assistance: 5: Stand by assistance (CGA) Dynamic Standing - Comments: for transfers and gait Extremity/Trunk Assessment RUE Assessment RUE Assessment: Within Functional Limits LUE Assessment LUE Assessment: Within Functional Limits  Care Tool Care Tool Self Care Eating        Oral Care    Oral Care Assist Level: Supervision/Verbal cueing (standing at sink)    Bathing   Body parts bathed by patient: Right arm;Left arm;Chest;Abdomen;Front perineal area;Right upper leg;Left upper leg;Face Body parts bathed by helper: Buttocks;Right lower leg;Left lower leg   Assist Level: Minimal Assistance - Patient > 75%    Upper Body Dressing(including orthotics)   What is the patient wearing?: Pull over shirt   Assist Level: Supervision/Verbal cueing (in stance at sink)    Lower Body Dressing (excluding footwear)   What is the patient wearing?: Pants Assist for lower body dressing: Moderate Assistance - Patient 50 - 74%    Putting on/Taking off footwear   What is the patient wearing?: Socks;Shoes Assist for footwear: Dependent - Patient 0%       Care Tool Toileting Toileting activity   Assist for toileting: Contact Guard/Touching assist     Care Tool Bed Mobility Roll left and right activity        Sit to lying activity        Lying to sitting on side of bed activity         Care Tool  Transfers Sit to stand transfer   Sit to stand assist level: 2 Helpers (from low sitting WC)    Chair/bed transfer   Chair/bed transfer assist level: Contact Guard/Touching assist     Toilet transfer   Assist Level: Contact Guard/Touching assist     Care Tool Cognition  Expression of Ideas and Wants Expression of Ideas and Wants: 4. Without difficulty (complex and basic) - expresses complex messages without difficulty and with speech that is clear and easy to understand  Understanding Verbal and Non-Verbal Content Understanding Verbal and Non-Verbal Content: 4.  Understands (complex and basic) - clear comprehension without cues or repetitions   Memory/Recall Ability Memory/Recall Ability : Current season;Location of own room;That he or she is in a hospital/hospital unit   Refer to Care Plan for [place] 1 OT Short Term Goal 1 (Week 1): STG = LTG 2/2 ELOS  Recommendations for other services: None    Skilled Therapeutic Intervention Session 1 Skilled OT intervention completed with discussion on POC, rehab goals and explanation of OT purpose. Pt received upright in w/c, agreeable to session, no c/o pain. Pt was received on RA with PMV already in place, with SPO2 at 91% however did de-sat to 85% with standing ADL tasks and functional transfers during session, with quick recovery to >90% upon seated rest break and pursed lip breathing exercises. Standing at sink, pt completed ADLs at the sit > stand level. See caretool for further details on assist level with self-care tasks performed. Pt reports use of AE including reacher, sock aid and long handled loofa for self-care at home at baseline, and reports her DBBSC does not fit into the space where her toilet is and refuses to place it anywhere but the bathroom. Completed ambulatory transfer with CGA to bathroom, with therapist retrieving and placing B\BSC over toilet at appropriate height to prevent difficulty with  standing, with pt able to complete all toileting tasks with CGA. Demonstrated greatest challenges with powering up from sitting in low/average level position, knee valgus, generalized weakness and endurance deficits with oxygen and HR effects noted with exertion. Pt left seated in w/c, with all needs in reach at end of session.  Session 2 Skilled OT intervention completed with focus on rollator education, bilateral knee control/alignment needed for efficient sit > stand in prep for ADL as well as generalized endurance exercises. Pt received seated in w/c, agreeable to unplanned therapy session, with no c/o pain. SPO2 at 92%, on RA, PMV already in place, and did de-sat to 80% after several rounds of sit > stands however improved with rest/controlled breathing to > 90%. Therapist retrieved bariatric rollator for pt to test out with ambulation, for endurance deficits at home in case she needs a quick seated rest break. Therapist raised the handles for more efficient grasping 2/2 body habitus. Pt ambulated from room <> gym with use of bari-rollator, with CGA-supervision. Therapist applied yellow theraband around bilateral knees, for facilitory cues to prevent knee valgus with sit > stands 2/2 body habitus. From initial 22" height, pt able to 10 sit > stands without AD with supervision. With use of mirror for visual feedback, and cues provided for bringing feet slightly more under her then pressing knees out vs feet out wide and knees in for standing, pt was able to progress to standing from 20" surface with CGA without AD. Improved with her stance position however reporting fatigue with 5 stands. Prolonged rest break needed 2/2 de-sating O2 as listed before. Completed the following exercises to promote general endurance needed for ADL tasks (with 4 pound dowel): -Chest presses 3x10 -Overhead presses 3x10 In pt's room, pt was left seated in w/c, with all needs in reach at end of session.   ADL ADL Equipment  Provided: Long-handled sponge Eating: Independent Where Assessed-Eating: Wheelchair Grooming: Supervision/safety Where Assessed-Grooming: Standing at sink Upper Body Bathing: Supervision/safety Where Assessed-Upper Body Bathing: Standing at sink Lower Body Bathing: Minimal assistance Where Assessed-Lower Body Bathing: Sitting at sink;Standing at sink Upper Body Dressing: Supervision/safety Where Assessed-Upper Body Dressing: Standing  at sink Lower Body Dressing: Moderate assistance Where Assessed-Lower Body Dressing: Standing at sink;Sitting at sink Toileting: Contact guard Where Assessed-Toileting: Bedside Commode Toilet Transfer: Contact guard Toilet Transfer Method: Counselling psychologist: Extra wide bedside commode;Grab bars Tub/Shower Transfer: Unable to assess Tub/Shower Transfer Method: Unable to assess Social research officer, government: Unable to assess Social research officer, government Method: Unable to assess Mobility  Transfers Sit to Stand: 2 Helpers (to stand from low sitting WC) Stand to Sit: Contact Guard/Touching assist   Discharge Criteria: Patient will be discharged from OT if patient refuses treatment 3 consecutive times without medical reason, if treatment goals not met, if there is a change in medical status, if patient makes no progress towards goals or if patient is discharged from hospital.  The above assessment, treatment plan, treatment alternatives and goals were discussed and mutually agreed upon: by patient  Blase Mess, MS, OTR/L  01/09/2022, 1:24 PM

## 2022-01-09 NOTE — Progress Notes (Signed)
Pt Arrived to unit in air mattress bed, refused to sleep in air mattress, preferred regular hospital bed.

## 2022-01-09 NOTE — Progress Notes (Signed)
Physical Therapy Session Note  Patient Details  Name: Angel Brewer MRN: [id number] Date of Birth: 1966-03-15  Today's Date: 01/09/2022 PT Individual Time: 1650-1730 PT Individual Time Calculation (min): 40 min   Short Term Goals: Week 1:  PT Short Term Goal 1 (Week 1): STG=LTG due to LOS   Skilled Therapeutic Interventions/Progress Updates:  Pt received sitting in WC and agreeable to PT.  Patient demonstrates increased fall risk as noted by score of   29/56 on Berg Balance Scale.  (<36= high risk for falls, close to 100%; 37-45 significant >80%; 46-51 moderate >50%; 52-55 lower >25%) see below for details.  Pt returned to room and performed ambulatory transfer to bed with CGA for safety. Sit>supine completed with mod assist at BLE, and left supine in bed with call bell in reach and all needs met.         Therapy Documentation Precautions:  Precautions Precautions: Fall Precaution Comments: PMSV, body habitus Restrictions Weight Bearing Restrictions: No  Vital Signs: Therapy Vitals Pulse Rate: 83 Resp: 18 Oxygen Therapy SpO2: 94 % O2 Device: Tracheostomy Collar O2 Flow Rate (L/min): 5 L/min FiO2 (%): 28 % Pain: Denies at rest    Balance: Standardized Balance Assessment Standardized Balance Assessment: Berg Balance Test Berg Balance Test Sit to Stand: Needs moderate or maximal assist to stand Standing Unsupported: Able to stand safely 2 minutes Sitting with Back Unsupported but Feet Supported on Floor or Stool: Able to sit safely and securely 2 minutes Stand to Sit: Sits independently, has uncontrolled descent Transfers: Needs one person to assist Standing Unsupported with Eyes Closed: Able to stand 10 seconds safely Standing Ubsupported with Feet Together: Able to place feet together independently and stand for 1 minute with supervision (as close has possible given habitus) From Standing, Reach Forward with Outstretched Arm: Can reach forward >12 cm safely  (5") From Standing Position, Pick up Object from Floor: Unable to try/needs assist to keep balance From Standing Position, Turn to Look Behind Over each Shoulder: Turn sideways only but maintains balance Turn 360 Degrees: Needs close supervision or verbal cueing Standing Unsupported, Alternately Place Feet on Step/Stool: Able to complete >2 steps/needs minimal assist Standing Unsupported, One Foot in Front: Able to plae foot ahead of the other independently and hold 30 seconds Standing on One Leg: Able to lift leg independently and hold equal to or more than 3 seconds Total Score: 29    Therapy/Group: Individual Therapy  Lorie Phenix 01/09/2022, 6:17 PM

## 2022-01-09 NOTE — Evaluation (Signed)
Physical Therapy Assessment and Plan  Patient Details  Name: Angel Brewer MRN: [id number] Date of Birth: 13-May-1966  PT Diagnosis: Abnormal posture, Abnormality of gait, Difficulty walking, Edema, and Muscle weakness Rehab Potential: Good ELOS: 5-7 days   Today's Date: 01/09/2022 PT Individual Time: 9147-8295 PT Individual Time Calculation (min): 70 min    Hospital Problem: Principal Problem:   Respiratory failure ([place])   Past Medical History:  Past Medical History:  Diagnosis Date   DVT (deep vein thrombosis) in pregnancy    RLE DVT 02/2016   Dyspnea    Morbid obesity ([place])    PE (pulmonary embolism) 02/29/2016   Sleep apnea    uses a cpap   Past Surgical History:  Past Surgical History:  Procedure Laterality Date   CESAREAN SECTION     x 3   OPEN REDUCTION INTERNAL FIXATION (ORIF) DISTAL RADIAL FRACTURE Right 06/20/2019   Procedure: OPEN REDUCTION INTERNAL FIXATION (ORIF) DISTAL RADIAL FRACTURE;  Surgeon: Angel Jakob, MD;  Location: [place];  Service: Orthopedics;  Laterality: Right;  REGIONAL BLOCK TOTAL SURGERY TIME: 90 MINUTES   SYNOVECTOMY Right 11/21/2019   Procedure: RIGHT WRIST LIGAMENT RECONSTRUCTION;  Surgeon: Angel Jakob, MD;  Location: [place];  Service: Orthopedics;  Laterality: Right;  PROCEDURE: RIGHT WRIST LIGAMENT RECONSTRUCTION LENGTH OF SURGERY: 105 MIN    Assessment & Plan Clinical Impression: Patient is a 56 y.o. year old female with who presented to the emergency department on 11/21/2021 with worsening shortness of breath, exertional dyspnea, orthopnea and nonproductive cough.  She has past medical history of heart failure with preserved ejection fraction, history of recurrent PE on Eliquis and pulmonary hypertension.  She also has a history of morbid obesity with obstructive sleep apnea/obesity related hypoventilation syndrome on CPAP trilogy at home.  Chest x-ray consistent with increased interstitial lung markings/pulmonary edema and she was given  Lasix 60 mg IV.  Heparin infusion. Her initial oxygen saturation was 60% on room air and on admission was requiring 4 L O2 via nasal cannula to maintain her SPO2 greater than 92%.  She had not been requiring supplemental oxygen at home.  COVID-negative.  Admitted for further evaluation and observation.  Updated echocardiogram with ejection fraction of 60 to 65%.  BiPAP used nightly.  Worsening respiratory status with somnolence and hypercarbia on 5/13 and ABGs obtained.  Critical care medicine/pulmonology consulted. Transferred to ICU, intubated on 5/15. Tracheostomy by Dr. Tacy Learn 5/19 after failed SBTs.  Placed on trach collar 5/24.  Transferred to hospitalist service on 5/27.  Started dysphagia 3 diet on 5/23 and was tolerating this therefore tube feeds and nasogastric tube were discontinued. She was later upgraded to regular/thin diet. Diuresis continued.  Using Passy-Muir valve and followed by pulmonary/critical care medicine weekly and as needed.  Felt not likely to decannulate given her use of Trilogy at home.  PT/OT evaluations carried out and mobility initiated.  Weaning from oxygen supplementation continued.  Daytime oxygen saturations currently stable on room air.  Follow-up by PCCM on 6/27.  Nocturnal hypoxemia noted and recommend trach collar with O2/humidity at night.  Trach care per protocol.  Will need outpatient trach clinic follow-up with Angel Kos, NP at discharge.  At this point, the patient is not a candidate for decannulation previously using trilogy at home.  Passy-Muir valve as tolerated. The patient requires inpatient physical medicine and rehabilitation evaluations and treatment secondary to dysfunction due to respiratory failure, heart failure, morbid obesity. She reports her shortness of breath is much improved. She reports she  has chronic knee pain due to OA and Voltaren gel helped this pain in the past. She reports mild lower back pain due to resumption of PT, this pain is improved this  tylenol.   Patient currently requires  CGA  with mobility secondary to muscle weakness, decreased cardiorespiratoy endurance and decreased oxygen support, and decreased standing balance, decreased postural control, and decreased balance strategies.  Prior to hospitalization, patient was independent  with mobility and lived with Alone in a House home.  Home access is 1 small step then a threshold. Larger step to enter from garageStairs to enter.  Patient will benefit from skilled PT intervention to maximize safe functional mobility, minimize fall risk, and decrease caregiver burden for planned discharge home with intermittent assist.  Anticipate patient will benefit from follow up [facility] at discharge.  PT - End of Session Activity Tolerance: Tolerates 30+ min activity with multiple rests Endurance Deficit: Yes Endurance Deficit Description: pt required rest breaks after ambulating and reported mild SOB PT Assessment Rehab Potential (ACUTE/IP ONLY): Good PT Barriers to Discharge: Home environment access/layout;Trach;Weight;Decreased caregiver support;Inaccessible home environment PT Barriers to Discharge Comments: 1 STE with additional threshold to enter home with no rails, lives alone and niece can only provide assist for first week upon D/C, body habitus/generalized weakness making standing difficult PT Patient demonstrates impairments in the following area(s): Balance;Endurance;Nutrition;Pain;Skin Integrity PT Transfers Functional Problem(s): Bed Mobility;Bed to Chair;Car;Furniture PT Locomotion Functional Problem(s): Ambulation;Wheelchair Mobility;Stairs PT Plan PT Intensity: Minimum of 1-2 x/day ,45 to 90 minutes PT Frequency: 5 out of 7 days PT Duration Estimated Length of Stay: 5-7 days PT Treatment/Interventions: Ambulation/gait training;Discharge planning;Functional mobility training;Psychosocial support;Therapeutic Activities;Balance/vestibular training;Disease  management/prevention;Neuromuscular re-education;Skin care/wound management;Therapeutic Exercise;Wheelchair propulsion/positioning;DME/adaptive equipment instruction;Pain management;UE/LE Strength taining/ROM;Community reintegration;Patient/family education;Stair training;UE/LE Coordination activities PT Transfers Anticipated Outcome(s): mod I with LRAD PT Locomotion Anticipated Outcome(s): mod I with LRAD PT Recommendation Follow Up Recommendations: Home health PT Patient destination: Home Equipment Recommended: To be determined Equipment Details: has bariatric RW  PT Evaluation Precautions/Restrictions Precautions Precautions: Fall Precaution Comments: PMSV, body habitus Restrictions Weight Bearing Restrictions: No Pain Interference Pain Interference Pain Effect on Sleep: 1. Rarely or not at all Pain Interference with Therapy Activities: 1. Rarely or not at all Pain Interference with Day-to-Day Activities: 1. Rarely or not at all Home Living/Prior [place] Living Arrangements: Alone Available Help at Discharge: Family;Available PRN/intermittently;Other (Comment) (pt reports her neice will stay with her for a week upon D/C) Type of Home: House Home Access: Stairs to enter Entrance Stairs-Number of Steps: 1 small step then a threshold. Larger step to enter from garage Entrance Stairs-Rails: None Home Layout: Able to live on main level with bedroom/bathroom;Two level Alternate Level Stairs-Number of Steps: flight Alternate Level Stairs-Rails: Right Bathroom Shower/Tub: Multimedia programmer: Handicapped height Bathroom Accessibility: Yes Additional Comments: pt reports having bariatric RW  Lives With: Alone Prior Function Level of Independence: Independent with basic ADLs;Independent with transfers;Independent with homemaking with ambulation;Independent with gait  Able to Take Stairs?: Yes Driving: Yes Vocation: Full time employment Vocation  Requirements: works at [employer] - History Ability to See in Adequate Light: 0 Adequate Perception Perception: Within Functional Limits  Cognition Overall Cognitive Status: Within Functional Limits for tasks assessed Arousal/Alertness: Awake/alert Orientation Level: Oriented X4 Memory: Appears intact Awareness: Appears intact Problem Solving: Appears intact Safety/Judgment: Appears intact Sensation Sensation Light Touch: Appears Intact Proprioception: Appears Intact Additional Comments: pt reports tingling along R lateral thighs at baseline Coordination Gross Motor Movements are Fluid  and Coordinated: Yes (generalized weakness/deconditioning) Fine Motor Movements are Fluid and Coordinated: Yes Finger Nose Finger Test: WFL bilaterally but slow Heel Shin Test: unable to perform due to body habitus Motor  Motor Motor: Within Functional Limits Motor - Skilled Clinical Observations: generalized weakness/deconditioning  Trunk/Postural Assessment  Cervical Assessment Cervical Assessment: Within Functional Limits Thoracic Assessment Thoracic Assessment: Within Functional Limits Lumbar Assessment Lumbar Assessment: Exceptions to [facility] (anterior pelvic tilt due to body habitus) Postural Control Postural Control: Within Functional Limits  Balance Balance Balance Assessed: Yes Static Sitting Balance Static Sitting - Balance Support: Feet supported;Bilateral upper extremity supported Static Sitting - Level of Assistance: 6: Modified independent (Device/Increase time) Static Standing Balance Static Standing - Balance Support: No upper extremity supported;During functional activity Static Standing - Level of Assistance: 5: Stand by assistance (close supervision) Dynamic Standing Balance Dynamic Standing - Balance Support: No upper extremity supported;During functional activity Dynamic Standing - Level of Assistance: 5: Stand by assistance (CGA) Dynamic  Standing - Comments: for transfers and gait Extremity Assessment  RLE Assessment RLE Assessment: Exceptions to [facility] General Strength Comments: limited by body habitus RLE Strength Right Hip Flexion: 3+/5 Right Hip ABduction: 4-/5 Right Hip ADduction: 4-/5 Right Knee Flexion: 4-/5 Right Knee Extension: 4-/5 Right Ankle Dorsiflexion: 4/5 Right Ankle Plantar Flexion: 4/5 LLE Assessment LLE Assessment: Exceptions to [facility] General Strength Comments: limited by body habitus LLE Strength Left Hip Flexion: 3+/5 Left Hip ABduction: 4-/5 Left Hip ADduction: 4-/5 Left Knee Flexion: 4-/5 Left Knee Extension: 4-/5 Left Ankle Dorsiflexion: 4/5 Left Ankle Plantar Flexion: 4/5  Care Tool Care Tool Bed Mobility Roll left and right activity        Sit to lying activity        Lying to sitting on side of bed activity         Care Tool Transfers Sit to stand transfer   Sit to stand assist level: 2 Helpers (from low sitting WC)    Chair/bed transfer   Chair/bed transfer assist level: Contact Guard/Touching assist     Psychologist, counselling transfer activity did not occur: Safety/medical concerns (fatigue, weakness)        Care Tool Locomotion Ambulation   Assist level: Contact Guard/Touching assist Assistive device: No Device Max distance: 13f  Walk 10 feet activity   Assist level: Contact Guard/Touching assist Assistive device: No Device   Walk 50 feet with 2 turns activity   Assist level: Contact Guard/Touching assist Assistive device: No Device  Walk 150 feet activity   Assist level: Contact Guard/Touching assist Assistive device: No Device  Walk 10 feet on uneven surfaces activity Walk 10 feet on uneven surfaces activity did not occur: Safety/medical concerns (fatigue, weakness)      Stairs Stair activity did not occur: Safety/medical concerns (fatigue, weakness)        Walk up/down 1 step activity Walk up/down 1 step or curb (drop down)  activity did not occur: Safety/medical concerns (fatigue, weakness)      Walk up/down 4 steps activity Walk up/down 4 steps activity did not occur: Safety/medical concerns (fatigue, weakness)      Walk up/down 12 steps activity Walk up/down 12 steps activity did not occur: Safety/medical concerns (fatigue, weakness)      Pick up small objects from floor Pick up small object from the floor (from standing position) activity did not occur: Safety/medical concerns (fatigue, weakness, decreased balance)      Wheelchair Is the patient using  a wheelchair?: Yes Type of Wheelchair: Manual   Wheelchair assist level: Dependent - Patient 0% Max wheelchair distance: >130f  Wheel 50 feet with 2 turns activity   Assist Level: Dependent - Patient 0%  Wheel 150 feet activity   Assist Level: Dependent - Patient 0%    Refer to Care Plan for Long Term Goals  SHORT TERM GOAL WEEK 1 PT Short Term Goal 1 (Week 1): STG=LTG due to LOS  Recommendations for other services: None   Skilled Therapeutic Intervention Evaluation completed (see details above and below) with education on PT POC and goals and individual treatment initiated with focus on functional mobility/transfers, generalized strengthening and endurance, dynamic standing balance/coordination, and ambulation. Received pt sitting in recliner, pt educated on PT evaluation, CIR policies, and therapy schedule and agreeable - pt denied any pain during session. Provided pt with bariatric WC and elevating legrests. When attempting to stand from recliner, pt was stuck and required increased time, various strategies, and ultimately max A to stand from low sitting recliner without AD (therapist going under pt's arm). Pt ambulated 160fx 1, 18630f1, and [id number] without AD and CGA throughout session. O2 sat dropped to 87% on RA but quickly increased to 94% with seated rest break. Stood from low sitting WC in hallway with max A +2 but pt able to stand from  elevated mat table with close supervision/CGA. Built up WC seat with cushions and pillows and pt [facility] to [facility] to pull up on and CGA. Concluded session with [facility] needs within reach. Safety plan updated - pt may benefit from use of bariatric RW during next session for energy conservation.  Mobility Transfers Transfers: Sit to Stand;Stand to Sit;Stand Pivot Transfers Sit to Stand: 2 Helpers (to stand from low sitting WC) Stand to Sit: Contact Guard/Touching assist Stand Pivot Transfers: Contact Guard/Touching assist Transfer (Assistive device): None Locomotion  Gait Ambulation: Yes Gait Assistance: Contact Guard/Touching assist Gait Distance (Feet): 186 Feet Assistive device: None Gait Gait: Yes Gait Pattern: Impaired Gait Pattern: Step-to pattern;Decreased step length - right;Decreased step length - left;Decreased stride length;Wide base of support;Poor foot clearance - left;Poor foot clearance - right Gait velocity: decreased Stairs / Additional Locomotion Stairs: No Wheelchair Mobility Wheelchair Mobility: No   Discharge Criteria: Patient will be discharged from PT if patient refuses treatment 3 consecutive times without medical reason, if treatment goals not met, if there is a change in medical status, if patient makes no progress towards goals or if patient is discharged from hospital.  The above assessment, treatment plan, treatment alternatives and goals were discussed and mutually agreed upon: by patient  AnnaAlfonse Alpers DPT  01/09/2022, 11:58 AM

## 2022-01-09 NOTE — Progress Notes (Signed)
Inpatient Rehabilitation Center Individual Statement of Services  Patient Name:  Angel Brewer  Date:  01/09/2022  Welcome to the [facility].  Our goal is to provide you with an individualized program based on your diagnosis and situation, designed to meet your specific needs.  With this comprehensive rehabilitation program, you will be expected to participate in at least 3 hours of rehabilitation therapies Monday-Friday, with modified therapy programming on the weekends.  Your rehabilitation program will include the following services:  Physical Therapy (PT), Occupational Therapy (OT), Speech Therapy (ST), 24 hour per day rehabilitation nursing, Therapeutic Recreaction (TR), Neuropsychology, Care Coordinator, Rehabilitation Medicine, Nutrition Services, Pharmacy Services, and Other  Weekly team conferences will be held on Wednesdays to discuss your progress.  Your Inpatient Rehabilitation Care Coordinator will talk with you frequently to get your input and to update you on team discussions.  Team conferences with you and your family in attendance may also be held.  Expected length of stay:  5-7 Days  Overall anticipated outcome:  MOD I  Depending on your progress and recovery, your program may change. Your Inpatient Rehabilitation Care Coordinator will coordinate services and will keep you informed of any changes. Your Inpatient Rehabilitation Care Coordinator's name and contact numbers are listed  below.  The following services may also be recommended but are not provided by the [place]:   [place] will be made to provide these services after discharge if needed.  Arrangements include referral to agencies that provide these services.  Your insurance has been verified to be:  MEDICAID Your primary doctor is:  [name], NP  Pertinent information will be shared  with your doctor and your insurance company.  Inpatient Rehabilitation Care Coordinator:  Erlene Quan, [place] or [phone]  Information discussed with and copy given to patient by: Dyanne Iha, 01/09/2022, 10:02 AM

## 2022-01-09 NOTE — Progress Notes (Signed)
PROGRESS NOTE   Subjective/Complaints:  No new complaints this morning Discussed labs with her She feels comfortable with P[facility], discussed with patient and SLP  ROS: +cough  Objective:   No results found. Recent Labs    01/09/22 0533  WBC 4.3  HGB 11.3*  HCT 36.1  PLT 213   Recent Labs    01/09/22 0533  NA 141  K 3.2*  CL 104  CO2 31  GLUCOSE 91  BUN 11  CREATININE 0.94  CALCIUM 9.0    Intake/Output Summary (Last 24 hours) at 01/09/2022 1115 Last data filed at 01/09/2022 0900 Gross per 24 hour  Intake 520 ml  Output --  Net 520 ml        Physical Exam: Vital Signs Blood pressure (!) 106/52, pulse 62, temperature 98 F (36.7 C), temperature source Oral, resp. rate 18, weight (!) 182.9 kg, last menstrual period 11/29/2015, SpO2 94 %. Gen: no distress, normal appearing HEENT: oral mucosa pink and moist, NCAT Cardio: Reg rate Chest: normal effort, normal rate of breathing Abd: soft, non-distended Ext: no edema Psych: pleasant, normal affect Skin: intact Neuro:  CN 2-12 intact, follow commands, Sensation intact in all 4 extremities, DTR normal and symmetric Strength 5/5 in all b/l UE Strength 4/5 in proximal b/l LE and 5/5 in distal b/l LE FTN intact Musculoskeletal: minimal knee tenderness to palpation No joint swelling noted Mild lumbar paraspinal tenderness  Assessment/Plan: 1. Functional deficits which require 3+ hours per day of interdisciplinary therapy in a comprehensive inpatient rehab setting. Physiatrist is providing close team supervision and 24 hour management of active medical problems listed below. Physiatrist and rehab team continue to assess barriers to discharge/monitor patient progress toward functional and medical goals  Care Tool:  Bathing              Bathing assist       Upper Body Dressing/Undressing Upper body dressing        Upper body assist      Lower Body  Dressing/Undressing Lower body dressing            Lower body assist       Toileting Toileting    Toileting assist       Transfers Chair/bed transfer  Transfers assist     Chair/bed transfer assist level: Contact Guard/Touching assist     Locomotion Ambulation   Ambulation assist      Assist level: Contact Guard/Touching assist Assistive device: No Device Max distance: 168f   Walk 10 feet activity   Assist     Assist level: Contact Guard/Touching assist Assistive device: No Device   Walk 50 feet activity   Assist    Assist level: Contact Guard/Touching assist Assistive device: No Device    Walk 150 feet activity   Assist    Assist level: Contact Guard/Touching assist Assistive device: No Device    Walk 10 feet on uneven surface  activity   Assist Walk 10 feet on uneven surfaces activity did not occur: Safety/medical concerns (fatigue, weakness)         Wheelchair     Assist Is the patient using a wheelchair?: Yes Type of Wheelchair: Manual  Wheelchair assist level: Dependent - Patient 0% Max wheelchair distance: >176f    Wheelchair 50 feet with 2 turns activity    Assist        Assist Level: Dependent - Patient 0%   Wheelchair 150 feet activity     Assist      Assist Level: Dependent - Patient 0%   Blood pressure (!) 106/52, pulse 62, temperature 98 F (36.7 C), temperature source Oral, resp. rate 18, weight (!) 182.9 kg, last menstrual period 11/29/2015, SpO2 94 %.    Medical Problem List and Plan: 1. Functional deficits secondary to acute on chronic respiratory failure             -patient may shower             -ELOS/Goals: 5-7 days, PT/OT mod I             -Initial CIR evals today 2.  Antithrombotics: -DVT/anticoagulation:  Pharmaceutical: Other (comment)Eliquis             -antiplatelet therapy: none 3. Pain Management: continue Tylenol as needed, voltaren gel 4. Mood/Sleep: LCSW to  evaluate and provide emotional support             -antipsychotic agents: n/a 5. Neuropsych/cognition: This patient is capable of making decisions on her own behalf. 6. Skin/Wound Care: Routine skin care checks 7. Fluids/Electrolytes/Nutrition: Strict Is and Os             -tolerating diet and does not want supplements 8: Acute on chronic heart failure with preserved ejection fraction -continue Farxiga 10 mg daily -continue Aldactone 25 mg daily -continue Lasix 40 mg daily -daily weight -strict Is and Os -follow BMP 9: Tracheostomy: Trach care per protocol -PCCM will follow-up weekly -PMV as tolerated        -not a candidate for decannulation 10: Hypertension: monitor  -BP has been well controlled overall 11: Obstructive sleep apnea/OHS: nocturnal trach collar with humidity             -nocturnal O2 ordered prn to keep O2 sat 96% 12: Morbid obesity: dietary counseling 13: Recurrent PE: continue Eliquis 548mBID 14: Anxiety: resolved; conitnue clonazepam prn 15: Hyperlipidemia: continue Lipitor 20 mg daily, counseling regarding diet 16. Knee pain suspected chronic OA -continue Voltaren gel ordered 17. Hypokalemia: discussed eating bananas  LOS: 1 days A FACE TO FACE EVALUATION WAS PERFORMED  Evaluna Utke P Ankush Gintz 01/09/2022, 11:15 AM

## 2022-01-09 NOTE — Progress Notes (Signed)
Inpatient Rehabilitation  Patient information reviewed and entered into eRehab system by Jaymz Traywick M. Joia Doyle, M.A., CCC/SLP, PPS Coordinator.  Information including medical coding, functional ability and quality indicators will be reviewed and updated through discharge.    

## 2022-01-10 DIAGNOSIS — E785 Hyperlipidemia, unspecified: Secondary | ICD-10-CM | POA: Diagnosis not present

## 2022-01-10 DIAGNOSIS — J9620 Acute and chronic respiratory failure, unspecified whether with hypoxia or hypercapnia: Secondary | ICD-10-CM | POA: Diagnosis not present

## 2022-01-10 DIAGNOSIS — E876 Hypokalemia: Secondary | ICD-10-CM | POA: Diagnosis not present

## 2022-01-10 DIAGNOSIS — G4733 Obstructive sleep apnea (adult) (pediatric): Secondary | ICD-10-CM | POA: Diagnosis not present

## 2022-01-10 MED ORDER — VITAMIN D (ERGOCALCIFEROL) 1.25 MG (50000 UNIT) PO CAPS
50000.0000 [IU] | ORAL_CAPSULE | ORAL | Status: DC
  Administered 2022-01-10: 50000 [IU] via ORAL
  Filled 2022-01-10: qty 1

## 2022-01-10 NOTE — Discharge Summary (Signed)
Physician Discharge Summary  Patient ID: Angel Brewer MRN: [id number] DOB/AGE: 13-Sep-1965 56 y.o.  Admit date: 01/08/2022 Discharge date: 01/16/2022  Discharge Diagnoses:  Principal Problem:   Respiratory failure ([place]) Active problems: Functional deficits secondary to respiratory and heart failure failure Acute on chronic heart failure Tracheostomy Hypertension Obstructive sleep apnea Obesity hypoventilation syndrome Recurrent PE Hyperlipidemia Knee pain   Discharged Condition: good  Significant Diagnostic Studies: No results found.  Labs:  Basic Metabolic Panel: Recent Labs  Lab 01/12/22 0504 01/13/22 0637 01/13/22 1631 01/15/22 0528  NA 137 139 139 140  K 3.3* 3.9 3.5 3.5  CL 103 102 103 103  CO2 _0 GLUCOSE 91 91 92 94  BUN _1 CREATININE 0.95 1.11* 1.19* 1.02*  CALCIUM 8.9 9.4 9.2 9.1    CBC: Recent Labs  Lab 01/13/22 0637  WBC 3.9*  HGB 11.9*  HCT 39.2  MCV 92.9  PLT 261    CBG: No results for input(s): "GLUCAP" in the last 168 hours.  Brief HPI:   Angel Brewer is a 56 y.o. female with a history of recurrent DVT/PE, obstructive sleep apnea, obesity hypoventilation syndrome who presented to the emergency department on 11/22/18/2023 with worsening shortness of breath, exertional dyspnea, orthopnea and nonproductive cough.  Maintained on Eliquis at home.  She was given Lasix and a heparin infusion initiated.  Respiratory status worsened with somnolence.  She required intubation and mechanical ventilation.  She failed spontaneous breathing trials and required tracheostomy.  She was ultimately placed on trach collar on 5/24 and transferred out of ICU.  Diet was advanced.  She is currently using Passy-Muir valve and requiring oxygen only at nighttime.   Hospital Course: Angel Brewer was admitted to rehab 01/08/2022 for inpatient therapies to consist of PT, ST and OT at least three hours five days a week. Past admission physiatrist,  therapy team and rehab RN have worked together to provide customized collaborative inpatient rehab.  Complained of knee pain and provided Voltaren gel.  Follow-up labs on 6/29 revealed serum potassium 3.2.  Advised regarding potassium rich food intake.  Ergocalciferol 5000 units with per week for 7 weeks prescribed for suboptimal vitamin D level. Complaining of thickened skin on feet. Referral to [facility] sent. Weight remained stable.Lasix decreased to 20 mg daily and aldactone 25 mg continued. Arrangements made to continue supplemental oxygen via trach collar at home for nighttime use. Serum potassium normal on 7/3 and on 7/5.  Blood pressures were monitored on TID basis and remained stable  Rehab course: During patient's stay in rehab weekly team conferences were held to monitor patient's progress, set goals and discuss barriers to discharge. At admission, patient required contact-guard assist for mobility, ADLs.  She  has had improvement in activity tolerance, balance, postural control as well as ability to compensate for deficits. She has had improvement in functional use RUE/LUE  and RLE/LLE as well as improvement in awareness  Disposition: Home There are no questions and answers to display.         Diet: Heart healthy  Special Instructions:  No driving, alcohol consumption or tobacco use.   30-35 minutes were spent on discharge planning and discharge summary.   Discharge Instructions     Ambulatory referral to Podiatry   Complete by: As directed    Painful callouses      Allergies as of 01/16/2022   No Known Allergies      Medication List  STOP taking these medications    acetaminophen 650 MG CR tablet Commonly known as: TYLENOL   albuterol 108 (90 Base) MCG/ACT inhaler Commonly known as: VENTOLIN HFA   clonazePAM 0.25 MG disintegrating tablet Commonly known as: KLONOPIN   dicyclomine 10 MG capsule Commonly known as: BENTYL   ondansetron 4 MG  tablet Commonly known as: ZOFRAN   pantoprazole 40 MG tablet Commonly known as: PROTONIX   polyethylene glycol powder 17 GM/SCOOP powder Commonly known as: GLYCOLAX/MIRALAX       TAKE these medications    atorvastatin 20 MG tablet Commonly known as: LIPITOR Take 1 tablet (20 mg total) by mouth daily.   diclofenac Sodium 1 % Gel Commonly known as: VOLTAREN Apply 4 g topically 4 (four) times daily.   Eliquis 5 MG Tabs tablet Generic drug: apixaban Take 1 tablet (5 mg total) by mouth 2 (two) times daily.   Farxiga 10 MG Tabs tablet Generic drug: dapagliflozin propanediol Take 1 tablet (10 mg total) by mouth daily.   furosemide 20 MG tablet Commonly known as: LASIX Take 1 tablet (20 mg total) by mouth daily. What changed:  medication strength how much to take   multivitamin with minerals Tabs tablet Take 1 tablet by mouth daily.   PRESCRIPTION MEDICATION 1 each by Other route at bedtime. CPAP- At bedtime   spironolactone 25 MG tablet Commonly known as: ALDACTONE Take 1 tablet (25 mg total) by mouth daily.   Vitamin D (Ergocalciferol) 1.25 MG (50000 UNIT) Caps capsule Commonly known as: DRISDOL Take 1 capsule (50,000 Units total) by mouth every 7 (seven) days. Start taking on: January 17, 2022        Follow-up Information     [name], NP Follow up.   Specialty: Nurse Practitioner Why: Call in 1-2 days to make arrangements for hospital follow-up appointment. Contact information: [place] [address] [phone]         Erick Colace, NP Follow up.   Specialties: Nurse Practitioner, Acute Care, Pulmonary Disease Why: Call in 1-2 days to make arrangements for hospital follow-up appointment. Contact information: [address] [place] [address] [phone]         Podiatry Follow up.   Why: Referral has been sent [facility] Contact information: [address] [place], [place] [address]  [phone]                Signed: Barbie Banner 01/16/2022, 10:15 AM

## 2022-01-10 NOTE — Progress Notes (Signed)
Physical Therapy Session Note  Patient Details  Name: Angel Brewer MRN: [id number] Date of Birth: October 18, 1965  Today's Date: 01/10/2022 PT Individual Time: 9199-5790 PT Individual Time Calculation (min): 70 min   Short Term Goals: Week 1:  PT Short Term Goal 1 (Week 1): STG=LTG due to LOS  Skilled Therapeutic Interventions/Progress Updates:   Received pt semi-reclined in bed, pt agreeable to PT treatment, and denied any pain during session. Session with emphasis on functional mobility/transfers, dressing, generalized strengthening and endurance, dynamic standing balance, gait training, and stair navigation. Pt transferred semi-reclined<>sitting EOB with HOB elevated and use of bedrails with CGA. Donned underwear, pants, socks, and shoes sitting EOB with max A due to body habitus. Stood without AD and CGA to pull pants over hips and returned to sitting EOB to doff nightgown and don bra/shirt with supervision. Applied deodorant standing and ambulated around room without AD and CGA to straighten up clothing. Stood at sink and applied lotion and combed hair with distant supervision - O2 sat dropped to 83% on RA with activity but quickly increased to >93% with cues for pursed lip breathing. Pt ambulated 138f without AD and CGA/close supervision to main therapy gym - stopped in hallway to chat with MD. Pt navigated 8 3in steps x 2 trials - Trial 1 with 2 railings and CGA ascending with a step through and descending with a step to pattern and Trial 2 with 1 railing and CGA ascending and descending with a step to pattern. O2 sats dropped to 81% but quickly recovered to >93% with seated rest break. Pt ambulated additional 1529fwithout AD and CGA to dayroom and worked on blocked practice sit<>stands without UE support 2x10 reps with emphasis on quad strength. Transitioned to alternating toe taps to 3in step 2x10 bilaterally with CGA/light min A for balance. Pt then performed the following exercises with  emphasis on UE/LE strength: -standing mini squats 2x10 with cues for technique  -standing bicep curls with 7.5lb dowel 2x10 -standing overhead chest press with 7.5lb dowel 2x10 - cues to exhale with exertion Pt reported 3/10 fatigue after exercises but O2 sats decreased as low as 81% with activity but quickly recovered to >90% with pursed lip breathing and rest. Pt ambulated additional 1259fithout AD and CGA back to room. Concluded session with pt sitting EOB with all needs within reach. Provided pt with ice water and O2 sats 93% on RA at rest.   Therapy Documentation Precautions:  Precautions Precautions: Fall Precaution Comments: PMSV, body habitus Restrictions Weight Bearing Restrictions: No  Therapy/Group: Individual Therapy AnnAlfonse Alpers, DPT  01/10/2022, 6:49 AM

## 2022-01-10 NOTE — Plan of Care (Signed)
  Problem: Consults Goal: RH GENERAL PATIENT EDUCATION Description: See Patient Education module for education specifics. Outcome: Progressing Goal: Skin Care Protocol Initiated - if Braden Score 18 or less Description: If consults are not indicated, leave blank or document N/A Outcome: Progressing   Problem: RH SKIN INTEGRITY Goal: RH STG SKIN FREE OF INFECTION/BREAKDOWN Description: Skin to remain free from additional breakdown/infection with Mod I assist. Outcome: Progressing Goal: RH STG MAINTAIN SKIN INTEGRITY WITH ASSISTANCE Description: STG Maintain Skin Integrity With Mod I Assistance. Outcome: Progressing Goal: RH STG ABLE TO PERFORM INCISION/WOUND CARE W/ASSISTANCE Description: STG Able To Perform Incision/Wound Care With Mod I Assistance. Outcome: Progressing   Problem: RH SAFETY Goal: RH STG ADHERE TO SAFETY PRECAUTIONS W/ASSISTANCE/DEVICE Description: STG Adhere to Safety Precautions With Cues and Reminders. Outcome: Progressing Goal: RH STG DECREASED RISK OF FALL WITH ASSISTANCE Description: STG Decreased Risk of Fall With Mod I Assistance. Outcome: Progressing   Problem: RH PAIN MANAGEMENT Goal: RH STG PAIN MANAGED AT OR BELOW PT'S PAIN GOAL Description: < 3 on a 0-10 pain scale. Outcome: Progressing   Problem: RH KNOWLEDGE DEFICIT GENERAL Goal: RH STG INCREASE KNOWLEDGE OF SELF CARE AFTER HOSPITALIZATION Description: Patient will demonstrate knowledge of self-care management, medication management, skin/wound care management, trach care management with educational materials and handouts provided by staff independently at discharge. Outcome: Progressing

## 2022-01-10 NOTE — Progress Notes (Signed)
Occupational Therapy Session Note  Patient Details  Name: Angel Brewer MRN: [id number] Date of Birth: 05-04-1966  Today's Date: 01/10/2022 OT Individual Time: 1005-1102 OT Individual Time Calculation (min): 57 min    Short Term Goals: Week 1:  OT Short Term Goal 1 (Week 1): STG = LTG 2/2 ELOS  Skilled Therapeutic Interventions/Progress Updates:  Skilled OT intervention completed with focus on IADL and home management, functional ambulatory and cardiorespiratory endurance. Pt received seated EOB, no c/o pain however expressing fatigue from earlier session, PMSV already in place. Completed sit > stands with HHA and CGA from higher surfaces. Pt ambulated throughout session without AD with varying supervision/CGA, with as far as 175 ft from room > ADL kitchen and back. Of note- pt does continue to de-sat with activity, dropping as low SPO2 75% but quickly recovering with rest to >90% with standing/seated rest breaks. Pt does require encouragement to take rest breaks, as she is very determined to get better but does not acknowledge her physical limitations in relation to task demands.   In ADL kitchen, pt demonstrated the ability to retrieve meal prep items from varying heights/surfaces, including managing cabinet/fridge/pantry doors, carry items in both hands to a table and wash items at the sink, all without a rest break. Good safety demonstration, however education provided regarding how to modify tasks, as well as energy conservation strategies to utilize I.e. placing a chair in each room or planning her day to do higher level tasks more spaced out. Discussed options for shower transfers and potential DME but will need to brainstorm plan for showers. Pt remained seated EOB in room, with all needs in reach at end of session.   Therapy Documentation Precautions:  Precautions Precautions: Fall Precaution Comments: PMSV, body habitus Restrictions Weight Bearing Restrictions:  No   Therapy/Group: Individual Therapy  Blase Mess, MS, OTR/L  01/10/2022, 7:32 AM

## 2022-01-10 NOTE — Progress Notes (Signed)
Occupational Therapy Session Note  Patient Details  Name: Angel Brewer MRN: [id number] Date of Birth: 07/21/1965  Today's Date: 01/10/2022 OT Individual Time: 1300-1400 OT Individual Time Calculation (min): 60 min    Short Term Goals: Week 1:  OT Short Term Goal 1 (Week 1): STG = LTG 2/2 ELOS  Skilled Therapeutic Interventions/Progress Updates:    Upon OT arrival, pt supine in bed. Reports no pain and was agreeable to OT treatment session. Treatment intervention with a focus on self care retraining, dynamic standing balance, endurance, and standing tolerance. Pt completes supine to sit transfer with SBA and donns shoes seated EOB with setup assist. Pt completes sit to stand transfer with CGA and completes functional mobility to dayroom gym using RW and SBA. Pt stands at tabletop to fold and sort laundry including towels, washcloths, pillow cases and blankets using B UE. Pt able to fold all items without seated rest break or LOB. Pt completes seated rest break after task with CGA. Pt then stands with CGA, retrieves towels and places into linen bag with CGA. Pt completes sit to stand with CGA and stands without UE support to retrieve bean bags from B sides using B UE and tosses onto cornhole board infront of pt. Pt had no LOB and requires no rest break. Pt retrieves bean bags from the floor using a reacher and places into basket before second trial. Pt tolerates task well. Pt performs functional mobility from dayroom gym back to her room with RW and completes toilet transfer with CGA, toileting with supervision, and hand hygiene with CGA without using DME. Pt ambulates back to her bed with CGA and completes stand to sit transfer with CGA. Pt performs sit to supine transfer with SBA and scoots up to [facility] with Min A using bed controls. Pt demonstrates improved activity tolerance and endurance but continues to benefit from OT services to achieve highest level of independence. Pt left in bed with all safety  measures in place and all needs met.   Therapy Documentation Precautions:  Precautions Precautions: Fall Precaution Comments: PMSV, body habitus Restrictions Weight Bearing Restrictions: No    Therapy/Group: Individual Therapy  [name] 01/10/2022, 4:18 PM

## 2022-01-10 NOTE — Progress Notes (Signed)
PROGRESS NOTE   Subjective/Complaints: No new complaints this morning Walking in hallway with Vicente Males Did dest in room to 83% initially- denied shortness of breath or cough at this time  ROS: denies cough or shortness of breath  Objective:   No results found. Recent Labs    01/09/22 0533  WBC 4.3  HGB 11.3*  HCT 36.1  PLT 213   Recent Labs    01/09/22 0533  NA 141  K 3.2*  CL 104  CO2 31  GLUCOSE 91  BUN 11  CREATININE 0.94  CALCIUM 9.0    Intake/Output Summary (Last 24 hours) at 01/10/2022 1039 Last data filed at 01/10/2022 0813 Gross per 24 hour  Intake 660 ml  Output --  Net 660 ml        Physical Exam: Vital Signs Blood pressure 110/70, pulse 67, temperature 98.2 F (36.8 C), temperature source Oral, resp. rate 16, weight (!) 181.7 kg, last menstrual period 11/29/2015, SpO2 95 %. Gen: no distress, normal appearing, BMI 62.74 HEENT: oral mucosa pink and moist, NCAT Cardio: Reg rate Chest: normal effort, normal rate of breathing Abd: soft, non-distended Ext: no edema Psych: pleasant, normal affect Skin: intact Neuro:  CN 2-12 intact, follow commands, Sensation intact in all 4 extremities, DTR normal and symmetric Strength 5/5 in all b/l UE Strength 4/5 in proximal b/l LE and 5/5 in distal b/l LE FTN intact Musculoskeletal: minimal knee tenderness to palpation No joint swelling noted Mild lumbar paraspinal tenderness  Assessment/Plan: 1. Functional deficits which require 3+ hours per day of interdisciplinary therapy in a comprehensive inpatient rehab setting. Physiatrist is providing close team supervision and 24 hour management of active medical problems listed below. Physiatrist and rehab team continue to assess barriers to discharge/monitor patient progress toward functional and medical goals  Care Tool:  Bathing    Body parts bathed by patient: Front perineal area, Buttocks (cleaned in  front of mirror)   Body parts bathed by helper: Buttocks, Right lower leg, Left lower leg     Bathing assist Assist Level: Supervision/Verbal cueing     Upper Body Dressing/Undressing Upper body dressing   What is the patient wearing?: Pull over shirt    Upper body assist Assist Level: Supervision/Verbal cueing    Lower Body Dressing/Undressing Lower body dressing      What is the patient wearing?: Pants     Lower body assist Assist for lower body dressing: Moderate Assistance - Patient 50 - 74%     Toileting Toileting    Toileting assist Assist for toileting: Contact Guard/Touching assist     Transfers Chair/bed transfer  Transfers assist     Chair/bed transfer assist level: Contact Guard/Touching assist     Locomotion Ambulation   Ambulation assist      Assist level: Contact Guard/Touching assist Assistive device: No Device Max distance: 141f   Walk 10 feet activity   Assist     Assist level: Contact Guard/Touching assist Assistive device: No Device   Walk 50 feet activity   Assist    Assist level: Contact Guard/Touching assist Assistive device: No Device    Walk 150 feet activity   Assist  Assist level: Contact Guard/Touching assist Assistive device: No Device    Walk 10 feet on uneven surface  activity   Assist Walk 10 feet on uneven surfaces activity did not occur: Safety/medical concerns (fatigue, weakness)         Wheelchair     Assist Is the patient using a wheelchair?: Yes Type of Wheelchair: Manual    Wheelchair assist level: Dependent - Patient 0% Max wheelchair distance: >146f    Wheelchair 50 feet with 2 turns activity    Assist        Assist Level: Dependent - Patient 0%   Wheelchair 150 feet activity     Assist      Assist Level: Dependent - Patient 0%   Blood pressure 110/70, pulse 67, temperature 98.2 F (36.8 C), temperature source Oral, resp. rate 16, weight (!) 181.7 kg,  last menstrual period 11/29/2015, SpO2 95 %.    Medical Problem List and Plan: 1. Functional deficits secondary to acute on chronic respiratory failure             -patient may shower             -ELOS/Goals: 5-7 days, PT/OT mod I            Continue CIR 2.  Antithrombotics: -DVT/anticoagulation:  Pharmaceutical: Other (comment)Eliquis             -antiplatelet therapy: none 3. Pain Management: continue Tylenol as needed, voltaren gel 4. Mood/Sleep: LCSW to evaluate and provide emotional support             -antipsychotic agents: n/a 5. Neuropsych/cognition: This patient is capable of making decisions on her own behalf. 6. Skin/Wound Care: Routine skin care checks 7. Fluids/Electrolytes/Nutrition: Strict Is and Os             -tolerating diet and does not want supplements 8: Acute on chronic heart failure with preserved ejection fraction -continue Farxiga 10 mg daily -continue Aldactone 25 mg daily -continue Lasix 40 mg daily -daily weight -strict Is and Os -follow BMP 9: Tracheostomy: Trach care per protocol -PCCM will follow-up weekly -PMV as tolerated        -not a candidate for decannulation 10: Hypertension: monitor  -BP has been well controlled overall 11: Obstructive sleep apnea/OHS: nocturnal trach collar with humidity             -nocturnal O2 ordered prn to keep O2 sat 96% 12: Morbid obesity: dietary counseling 13: Recurrent PE: continue Eliquis 570mBID 14: Anxiety: resolved; conitnue clonazepam prn 15: Hyperlipidemia: continue Lipitor 20 mg daily, counseling regarding diet 16. Knee pain suspected chronic OA -continue Voltaren gel ordered 17. Hypokalemia: discussed eating bananas, repeat Monday.  18. Suboptimal vitamin D level 31: prescribed ergocalciferol 50,000U once per week for 7 weeks.   LOS: 2 days A FACE TO FACE EVALUATION WAS PERFORMED  KrClide Deutscheraulkar 01/10/2022, 10:39 AM

## 2022-01-11 DIAGNOSIS — J9620 Acute and chronic respiratory failure, unspecified whether with hypoxia or hypercapnia: Secondary | ICD-10-CM | POA: Diagnosis not present

## 2022-01-11 MED ORDER — POTASSIUM CHLORIDE CRYS ER 20 MEQ PO TBCR
40.0000 meq | EXTENDED_RELEASE_TABLET | Freq: Two times a day (BID) | ORAL | Status: AC
Start: 2022-01-11 — End: 2022-01-11
  Administered 2022-01-11 (×2): 40 meq via ORAL
  Filled 2022-01-11 (×2): qty 2

## 2022-01-11 NOTE — Progress Notes (Addendum)
Occupational Therapy Session Note  Patient Details  Name: Libra Gatz MRN: [id number] Date of Birth: [birth year]  Today's Date: 01/11/2022 OT Individual Time: 1515-1630 OT Individual Time Calculation (min): 75 min    Short Term Goals: Week 1:  OT Short Term Goal 1 (Week 1): STG = LTG 2/2 ELOS  Skilled Therapeutic Interventions/Progress Updates:     Upon OT arrival, pt seated EOB and reports no pain. Agreeable to OT treatment session. OT intervention with a focus on self care retraining, endurance, strengthening, functional mobility, and activity tolerance. Pt completes sit to stand transfer with Independently and stands to donn shoes with Min A. Pt performs functional mobility to dayroom gym with Supervision and engages in 2 games on Wii Sports with this therapist. Pt able to tolerate standing for ~25 minutes with Supervision without seated rest break. Pt takes seated rest break at end of task performing stand to sit transfer with Independently. Pt completes sit to stand transfer Independently and ambulates to rehab apartment with Supervision and engages in IADL task. Pt was provided with flat sheet, blanket, and 2 pillow cases to make bed. Pt able to complete with Supervision and requires no rest break. Pt ambulates to bathroom with Supervision and completes toilet transfer Mod I, toileting Mod I, and hand hygiene Mod I. Pt ambulates back to dayroom gym with Supervision and completes stand to sit transfer onto EOM Independently. Pt takes a seated rest break before engaging in strengthening task. While seated EOM, therapist donned 1.5lb wrist weights onto pt B UE and pt flips over and sorts quirkle game pieces based on shape. Pt then crosses midline to retrieve and place game pieces into game bag demonstrating an arc movement repetitively until all tiles were returned. Pt requires short rest breaks to complete. Pt completes sit to stand transfer Independently and ambulates back to her room with  Supervision and completes stand to sit transfer Independently. Pt left in bed with all needs met. Pt making progress towards stated OT goals and continues to benefit from OT services to achieve highest level of independence.   Therapy Documentation Precautions:  Precautions Precautions: Fall Precaution Comments: PMSV, body habitus Restrictions Weight Bearing Restrictions: No   Therapy/Group: Individual Therapy  [name] 01/11/2022, 4:38 PM

## 2022-01-11 NOTE — IPOC Note (Signed)
Overall Plan of Care [facility]) Patient Details Name: Angel Brewer MRN: [id number] DOB: 07/31/1965  Admitting Diagnosis: Respiratory failure [facility])  Hospital Problems: Principal Problem:   Respiratory failure ([place])     Functional Problem List: Nursing Edema, Endurance, Medication Management, Motor, Safety, Skin Integrity  PT Balance, Endurance, Nutrition, Pain, Skin Integrity  OT Balance, Edema, Endurance, Pain, Safety  SLP    TR         Basic ADL's: OT Grooming, Bathing, Dressing, Toileting     Advanced  ADL's: OT Simple Meal Preparation, Laundry     Transfers: PT Bed Mobility, Bed to Chair, Musician, Manufacturing systems engineer, Metallurgist: PT Ambulation, Emergency planning/management officer, Stairs     Additional Impairments: OT None  SLP        TR      Anticipated Outcomes Item Anticipated Outcome  Self Feeding Indep  Swallowing      Basic self-care  Mod I  Toileting  mod I   Bathroom Transfers Mod I  Bowel/Bladder  n/a  Transfers  mod I with LRAD  Locomotion  mod I with LRAD  Communication     Cognition     Pain  < 3  Safety/Judgment  Mod I   Therapy Plan: PT Intensity: Minimum of 1-2 x/day ,45 to 90 minutes PT Frequency: 5 out of 7 days PT Duration Estimated Length of Stay: 5-7 days OT Intensity: Minimum of 1-2 x/day, 45 to 90 minutes OT Frequency: 5 out of 7 days OT Duration/Estimated Length of Stay: 5-7 days     Team Interventions: Nursing Interventions Patient/Family Education, Disease Management/Prevention, Medication Management, Skin Care/Wound Management, Discharge Planning  PT interventions Ambulation/gait training, Discharge planning, Functional mobility training, Psychosocial support, Therapeutic Activities, Balance/vestibular training, Disease management/prevention, Neuromuscular re-education, Skin care/wound management, Therapeutic Exercise, Wheelchair propulsion/positioning, DME/adaptive equipment instruction, Pain management, UE/LE  Strength taining/ROM, Community reintegration, Barrister's clerk education, IT trainer, UE/LE Coordination activities  OT Interventions Training and development officer, Discharge planning, Pain management, Self Care/advanced ADL retraining, Therapeutic Activities, Disease mangement/prevention, Functional mobility training, Patient/family education, Therapeutic Exercise, Community reintegration, Engineer, drilling, UE/LE Strength taining/ROM  SLP Interventions    TR Interventions    SW/CM Interventions Discharge Planning, Psychosocial Support, Patient/Family Education, Disease Management/Prevention   Barriers to Discharge MD  Medical stability, Home enviroment access/loayout, Trach, and Weight  Nursing Decreased caregiver support, Home environment access/layout, Wound Care, Lack of/limited family support, Weight, Trach, New oxygen 2 level, 1 step, no rails. Lives alone. Main level bed/bath. Niece can assist PRN.  PT Home environment access/layout, Trach, Weight, Decreased caregiver support, Inaccessible home environment 1 STE with additional threshold to enter home with no rails, lives alone and niece can only provide assist for first week upon D/C, body habitus/generalized weakness making standing difficult  OT Home environment access/layout, Lack of/limited family support, Weight, [place] for SNF coverage, Lack of/limited family support, Decreased caregiver support, Neurogenic Bowel & Bladder     Team Discharge Planning: Destination: PT-Home ,OT- Home , SLP-  Projected Follow-up: PT-Home health PT, OT-  None, SLP-  Projected Equipment Needs: PT-To be determined, OT- To be determined, SLP-  Equipment Details: PT-has bariatric RW, OT-  Patient/family involved in discharge planning: PT- Patient,  OT-Patient, SLP-   MD ELOS: 5-7 days Medical Rehab Prognosis:  Excellent Assessment: The patient has been admitted for CIR therapies with the diagnosis of acute  on chronic resp failure with debility. The team will be  addressing functional mobility, strength, stamina, balance, safety, adaptive techniques and equipment, self-care, bowel and bladder mgt, patient and caregiver education, trach care. Goals have been set at mod I. Anticipated discharge destination is home.        See Team Conference Notes for weekly updates to the plan of care

## 2022-01-11 NOTE — Progress Notes (Signed)
PROGRESS NOTE   Subjective/Complaints:  Pt reports only has SOB at night- wearing some O2 at night via trach, but otherwise, no SOB/DOE.   Also denies pain Wants to learn how to clean/handle trach and inner cannula.    ROS:  Pt denies SOB, abd pain, CP, N/V/C/D, and vision changes   Objective:   No results found. Recent Labs    01/09/22 0533  WBC 4.3  HGB 11.3*  HCT 36.1  PLT 213   Recent Labs    01/09/22 0533  NA 141  K 3.2*  CL 104  CO2 31  GLUCOSE 91  BUN 11  CREATININE 0.94  CALCIUM 9.0    Intake/Output Summary (Last 24 hours) at 01/11/2022 1039 Last data filed at 01/11/2022 0600 Gross per 24 hour  Intake 540 ml  Output 1 ml  Net 539 ml        Physical Exam: Vital Signs Blood pressure 95/62, pulse 68, temperature 98.3 F (36.8 C), temperature source Oral, resp. rate 20, height _0  (1.702 m), weight (!) 181.7 kg, last menstrual period 11/29/2015, SpO2 94 %.    General: awake, alert, appropriate, sitting EOB; finishing breakfast; BMI 62; NAD HENT: conjugate gaze; oropharynx moist- trach with PMV in place CV: regular rate; no JVD Pulmonary: CTA B/L; no W/R/R- good air movement- sounds great- no O2 in place GI: soft, NT, ND, (+)BS Psychiatric: appropriate; pleasant Neurological: Ox3  Skin: intact Neuro:  CN 2-12 intact, follow commands, Sensation intact in all 4 extremities, DTR normal and symmetric Strength 5/5 in all b/l UE Strength 4/5 in proximal b/l LE and 5/5 in distal b/l LE FTN intact Musculoskeletal: minimal knee tenderness to palpation No joint swelling noted Mild lumbar paraspinal tenderness  Assessment/Plan: 1. Functional deficits which require 3+ hours per day of interdisciplinary therapy in a comprehensive inpatient rehab setting. Physiatrist is providing close team supervision and 24 hour management of active medical problems listed below. Physiatrist and rehab team  continue to assess barriers to discharge/monitor patient progress toward functional and medical goals  Care Tool:  Bathing    Body parts bathed by patient: Front perineal area, Buttocks (cleaned in front of mirror)   Body parts bathed by helper: Buttocks, Right lower leg, Left lower leg     Bathing assist Assist Level: Supervision/Verbal cueing     Upper Body Dressing/Undressing Upper body dressing   What is the patient wearing?: Pull over shirt    Upper body assist Assist Level: Supervision/Verbal cueing    Lower Body Dressing/Undressing Lower body dressing      What is the patient wearing?: Pants     Lower body assist Assist for lower body dressing: Moderate Assistance - Patient 50 - 74%     Toileting Toileting    Toileting assist Assist for toileting: Supervision/Verbal cueing     Transfers Chair/bed transfer  Transfers assist     Chair/bed transfer assist level: Supervision/Verbal cueing     Locomotion Ambulation   Ambulation assist      Assist level: Supervision/Verbal cueing Assistive device: No Device Max distance: 128f   Walk 10 feet activity   Assist     Assist level: Supervision/Verbal cueing  Assistive device: No Device   Walk 50 feet activity   Assist    Assist level: Supervision/Verbal cueing Assistive device: No Device    Walk 150 feet activity   Assist    Assist level: Supervision/Verbal cueing Assistive device: No Device    Walk 10 feet on uneven surface  activity   Assist Walk 10 feet on uneven surfaces activity did not occur: Safety/medical concerns (fatigue, weakness)         Wheelchair     Assist Is the patient using a wheelchair?: Yes Type of Wheelchair: Manual    Wheelchair assist level: Dependent - Patient 0% Max wheelchair distance: >124f    Wheelchair 50 feet with 2 turns activity    Assist        Assist Level: Dependent - Patient 0%   Wheelchair 150 feet activity      Assist      Assist Level: Dependent - Patient 0%   Blood pressure 95/62, pulse 68, temperature 98.3 F (36.8 C), temperature source Oral, resp. rate 20, height _0  (1.702 m), weight (!) 181.7 kg, last menstrual period 11/29/2015, SpO2 94 %.    Medical Problem List and Plan: 1. Functional deficits secondary to acute on chronic respiratory failure             -patient may shower             -ELOS/Goals: 5-7 days, PT/OT mod I            Con't CIR- PT and OT- puls eox in room 2.  Antithrombotics: -DVT/anticoagulation:  Pharmaceutical: Other (comment)Eliquis             -antiplatelet therapy: none 3. Pain Management: continue Tylenol as needed, voltaren gel  7/1- pain controlled- con't regimen 4. Mood/Sleep: LCSW to evaluate and provide emotional support             -antipsychotic agents: n/a 5. Neuropsych/cognition: This patient is capable of making decisions on her own behalf. 6. Skin/Wound Care: Routine skin care checks 7. Fluids/Electrolytes/Nutrition: Strict Is and Os             -tolerating diet and does not want supplements 8: Acute on chronic heart failure with preserved ejection fraction -continue Farxiga 10 mg daily -continue Aldactone 25 mg daily -continue Lasix 40 mg daily -daily weight -strict Is and Os -follow BMP  7/1- weight stable- being checked daily.  9: Tracheostomy: Trach care per protocol -PCCM will follow-up weekly -PMV as tolerated        -not a candidate for decannulation  7/1- placed order for pt to be trained in trach care/esp inner cannula.  10: Hypertension: monitor  -BP has been well controlled overall 11: Obstructive sleep apnea/OHS: nocturnal trach collar with humidity             -nocturnal O2 ordered prn to keep O2 sat 96% 12: Morbid obesity: dietary counseling 13: Recurrent PE: continue Eliquis 589mBID 14: Anxiety: resolved; conitnue clonazepam prn 15: Hyperlipidemia: continue Lipitor 20 mg daily, counseling regarding diet 16.  Knee pain suspected chronic OA -continue Voltaren gel ordered 17. Hypokalemia: discussed eating bananas, repeat Monday.  18. Suboptimal vitamin D level 31: prescribed ergocalciferol 50,000U once per week for 7 weeks. \I spent a total of    minutes on total care today- >50% coordination of care- due to    I spent a total of   39 minutes on total care today- >50% coordination of care- [facility] about [facility] sats- also about teaching trach care   LOS: 3 days A FACE TO FACE EVALUATION WAS PERFORMED  Harshini Trent 01/11/2022, 10:39 AM

## 2022-01-11 NOTE — Progress Notes (Signed)
Placed patient on hme with oxygen bleed in at 3lpm. Sp02=100% at this time. Will continue to monitor patient.

## 2022-01-11 NOTE — Progress Notes (Signed)
Physical Therapy Session Note  Patient Details  Name: Angel Brewer MRN: [id number] Date of Birth: 17-Nov-1965  Today's Date: 01/11/2022 PT Individual Time: 5397-6734 and [id number] PT Individual Time Calculation (min): 70 min and 40 min  Short Term Goals: Week 1:  PT Short Term Goal 1 (Week 1): STG=LTG due to LOS  Skilled Therapeutic Interventions/Progress Updates:   Treatment Session 1 Received pt sitting EOB, pt agreeable to PT treatment, and denied any pain during session but soreness from therapy yesterday. Session with emphasis on functional mobility/transfers, generalized strengthening and endurance, dynamic standing balance/coordination, stair navigation, and gait training. Donned underwear, pants, socks, and shoes sitting EOB with max A to thread LEs through due to body habitus and pt stood and pulled pants over hips with close supervision. Pt transferred sit<>stand without AD and close supervision and ambulated 118f without AD and close supervision to main therapy gym. Pt navigated 8 3in steps with 1 handrail x 2 trials with CGA for balance ascending with a step through pattern and descending with a step to pattern - O2 sats 82% after stair navigation increasing to 92% with seated rest. Pt ambulated additional 1226fx 2 trials without AD and close supervision to/from ortho gym and discussed technique for car transfers. Pt ultimately declined practicing stating the seat was too low and raising the seat height makes it difficult to get leg into car. Pt states she backs up to the car and kick one leg back into the car and then sits on the seat and gets her legs in the car -  this technique works best for her. Pt performed the following exercises with emphasis on LE strength and endurance with UE support and supervision: -hip abduction with red TB 2x15 bilaterally  -hip flexion with red TB 2x12 bilaterally  -mini squats 1x10 and 2x15 with 2.2lb medicine ball and red TB around thighs -heel  raises 2x10 O2 sat dropped as low as 81% throughout session with activity on RA, but pt able to quickly recover >90% with seated rest break and reminders for pursed lip breathing, as pt is frequently distracted talking. Pt ambulated [id number] AD and close supervision back to room. Doffed shoes with max A and transferred sit<>supine with supervision. Concluded session with pt semi-reclined in bed, needs within reach, and bed alarm on. Provided pt with fresh ice water and pt's O2 sats 93% on RA.   Treatment Session 2 Received pt sitting EOB, pt agreeable to PT treatment, and denied any pain during session. Session with emphasis on functional mobility/transfers, generalized strengthening and endurance, dynamic standing balance/coordination, and gait training. Pt performed all transfers without AD and supervision throughout session. Pt ambulated [id number] trials without AD and close supervision to/from dayroom. Worked on blocked practice sit<>stands without UE support on Airex x10 reps with CGA for balance and with chair placed in front to ease fear of falling. Worked on static standing balance on Airex with therapist providing perturbations for ~30 seconds; pt with no LOB. Transitioned to SLS on Airex - pt able to maintain balance on RLE for 25 seconds and but LLE for only 5 seconds due to increased L knee pain. Pt then performed the following exercises with emphasis on UE strength/endurance: -standing bicep curls 2x10 bilaterally with 10lb dumbbell on LUE and 7lb dumbbell on RUE -standing overhead tricep extensions 2x10 bilaterally with 4lb dumbbell on LUE and 2lb dumbbell on RUE -standing overhead chest press<> horizontal chest press at 90 degrees with 5lb dowel 2x10  Worked on UE strength and endurance standing "rowing the boat" with 1lb dowel for 1 minute x 2 trials to fatigue. O2 sats only dropped to 86% during session, but pt able to quickly recover >90%. Returned to room, doffed socks and shoes with  max A, and transferred sit<>supine with supervision. Concluded session with pt semi-reclined in bed, needs within reach, and bed alarm on.   Therapy Documentation Precautions:  Precautions Precautions: Fall Precaution Comments: PMSV, body habitus Restrictions Weight Bearing Restrictions: No  Therapy/Group: Individual Therapy Alfonse Alpers PT, DPT  01/11/2022, 6:56 AM

## 2022-01-11 NOTE — Plan of Care (Signed)
  Problem: Consults Goal: RH GENERAL PATIENT EDUCATION Description: See Patient Education module for education specifics. Outcome: Progressing Goal: Skin Care Protocol Initiated - if Braden Score 18 or less Description: If consults are not indicated, leave blank or document N/A Outcome: Progressing   Problem: RH SKIN INTEGRITY Goal: RH STG SKIN FREE OF INFECTION/BREAKDOWN Description: Skin to remain free from additional breakdown/infection with Mod I assist. Outcome: Progressing Goal: RH STG MAINTAIN SKIN INTEGRITY WITH ASSISTANCE Description: STG Maintain Skin Integrity With Mod I Assistance. Outcome: Progressing Goal: RH STG ABLE TO PERFORM INCISION/WOUND CARE W/ASSISTANCE Description: STG Able To Perform Incision/Wound Care With Mod I Assistance. Outcome: Progressing   Problem: RH SAFETY Goal: RH STG ADHERE TO SAFETY PRECAUTIONS W/ASSISTANCE/DEVICE Description: STG Adhere to Safety Precautions With Cues and Reminders. Outcome: Progressing Goal: RH STG DECREASED RISK OF FALL WITH ASSISTANCE Description: STG Decreased Risk of Fall With Mod I Assistance. Outcome: Progressing   Problem: RH PAIN MANAGEMENT Goal: RH STG PAIN MANAGED AT OR BELOW PT'S PAIN GOAL Description: < 3 on a 0-10 pain scale. Outcome: Progressing   Problem: RH KNOWLEDGE DEFICIT GENERAL Goal: RH STG INCREASE KNOWLEDGE OF SELF CARE AFTER HOSPITALIZATION Description: Patient will demonstrate knowledge of self-care management, medication management, skin/wound care management, trach care management with educational materials and handouts provided by staff independently at discharge. Outcome: Progressing

## 2022-01-12 DIAGNOSIS — J9620 Acute and chronic respiratory failure, unspecified whether with hypoxia or hypercapnia: Secondary | ICD-10-CM | POA: Diagnosis not present

## 2022-01-12 LAB — BASIC METABOLIC PANEL
Anion gap: 7 (ref 5–15)
BUN: 11 mg/dL (ref 6–20)
CO2: 27 mmol/L (ref 22–32)
Calcium: 8.9 mg/dL (ref 8.9–10.3)
Chloride: 103 mmol/L (ref 98–111)
Creatinine, Ser: 0.95 mg/dL (ref 0.44–1.00)
GFR, Estimated: >60 mL/min (ref >60)
Glucose, Bld: 91 mg/dL (ref 70–99)
Potassium: 3.3 mmol/L — ABNORMAL LOW (ref 3.5–5.1)
Sodium: 137 mmol/L (ref 135–145)

## 2022-01-12 MED ORDER — POTASSIUM CHLORIDE CRYS ER 20 MEQ PO TBCR
40.0000 meq | EXTENDED_RELEASE_TABLET | Freq: Two times a day (BID) | ORAL | Status: DC
Start: 2022-01-12 — End: 2022-01-12
  Administered 2022-01-12: 40 meq via ORAL
  Filled 2022-01-12: qty 2

## 2022-01-12 MED ORDER — POTASSIUM CHLORIDE 20 MEQ PO PACK
40.0000 meq | PACK | Freq: Once | ORAL | Status: AC
  Administered 2022-01-12: 40 meq via ORAL
  Filled 2022-01-12: qty 2

## 2022-01-12 NOTE — Progress Notes (Signed)
PROGRESS NOTE   Subjective/Complaints:  Pt reports no issues except took off O2, but then fell asleep again.  So reason why O2 sats down to 89%.   Sleeping well- doing well with therapy.   ROS:   Pt denies SOB, abd pain, CP, N/V/C/D, and vision changes   Objective:   No results found. No results for input(s): "WBC", "HGB", "HCT", "PLT" in the last 72 hours.  Recent Labs    01/12/22 0504  NA 137  K 3.3*  CL 103  CO2 27  GLUCOSE 91  BUN 11  CREATININE 0.95  CALCIUM 8.9    Intake/Output Summary (Last 24 hours) at 01/12/2022 0924 Last data filed at 01/11/2022 1804 Gross per 24 hour  Intake 436 ml  Output --  Net 436 ml        Physical Exam: Vital Signs Blood pressure (!) 104/51, pulse 64, temperature 98.2 F (36.8 C), temperature source Oral, resp. rate 15, height _0  (1.702 m), weight (!) 182 kg, last menstrual period 11/29/2015, SpO2 99 %.     General: awake, alert, appropriate, BMI 62; sleeping initially, but woke to stimuli;  NAD HENT: conjugate gaze; oropharynx moist CV: regular rate; no JVD Pulmonary: CTA B/L; no W/R/R- decreased at bases; sats 89% off O2 since asleep- came up to 92% before I left room;  GI: soft, NT, ND, (+)BS- protuberant Psychiatric: appropriate Neurological: Ox3  Skin: intact Neuro:  CN 2-12 intact, follow commands, Sensation intact in all 4 extremities, DTR normal and symmetric Strength 5/5 in all b/l UE Strength 4/5 in proximal b/l LE and 5/5 in distal b/l LE FTN intact Musculoskeletal: minimal knee tenderness to palpation No joint swelling noted Mild lumbar paraspinal tenderness  Assessment/Plan: 1. Functional deficits which require 3+ hours per day of interdisciplinary therapy in a comprehensive inpatient rehab setting. Physiatrist is providing close team supervision and 24 hour management of active medical problems listed below. Physiatrist and rehab team continue  to assess barriers to discharge/monitor patient progress toward functional and medical goals  Care Tool:  Bathing    Body parts bathed by patient: Front perineal area, Buttocks (cleaned in front of mirror)   Body parts bathed by helper: Buttocks, Right lower leg, Left lower leg     Bathing assist Assist Level: Supervision/Verbal cueing     Upper Body Dressing/Undressing Upper body dressing   What is the patient wearing?: Pull over shirt    Upper body assist Assist Level: Supervision/Verbal cueing    Lower Body Dressing/Undressing Lower body dressing      What is the patient wearing?: Pants     Lower body assist Assist for lower body dressing: Moderate Assistance - Patient 50 - 74%     Toileting Toileting    Toileting assist Assist for toileting: Independent     Transfers Chair/bed transfer  Transfers assist     Chair/bed transfer assist level: Supervision/Verbal cueing     Locomotion Ambulation   Ambulation assist      Assist level: Supervision/Verbal cueing Assistive device: No Device Max distance: 131f   Walk 10 feet activity   Assist     Assist level: Supervision/Verbal cueing Assistive device: No  Device   Walk 50 feet activity   Assist    Assist level: Supervision/Verbal cueing Assistive device: No Device    Walk 150 feet activity   Assist    Assist level: Supervision/Verbal cueing Assistive device: No Device    Walk 10 feet on uneven surface  activity   Assist Walk 10 feet on uneven surfaces activity did not occur: Safety/medical concerns (fatigue, weakness)         Wheelchair     Assist Is the patient using a wheelchair?: Yes Type of Wheelchair: Manual    Wheelchair assist level: Dependent - Patient 0% Max wheelchair distance: >162f    Wheelchair 50 feet with 2 turns activity    Assist        Assist Level: Dependent - Patient 0%   Wheelchair 150 feet activity     Assist      Assist  Level: Dependent - Patient 0%   Blood pressure (!) 104/51, pulse 64, temperature 98.2 F (36.8 C), temperature source Oral, resp. rate 15, height _0  (1.702 m), weight (!) 182 kg, last menstrual period 11/29/2015, SpO2 99 %.    Medical Problem List and Plan: 1. Functional deficits secondary to acute on chronic respiratory failure             -patient may shower             -ELOS/Goals: 5-7 days, PT/OT mod I            Con't CIR - PT and OT- con't continuous pulse ox.  2.  Antithrombotics: -DVT/anticoagulation:  Pharmaceutical: Other (comment)Eliquis             -antiplatelet therapy: none 3. Pain Management: continue Tylenol as needed, voltaren gel  7/1- pain controlled- con't regimen 4. Mood/Sleep: LCSW to evaluate and provide emotional support             -antipsychotic agents: n/a 5. Neuropsych/cognition: This patient is capable of making decisions on her own behalf. 6. Skin/Wound Care: Routine skin care checks 7. Fluids/Electrolytes/Nutrition: Strict Is and Os             -tolerating diet and does not want supplements 8: Acute on chronic heart failure with preserved ejection fraction -continue Farxiga 10 mg daily -continue Aldactone 25 mg daily -continue Lasix 40 mg daily -daily weight -strict Is and Os -follow BMP 7/2- weight stable- con't to check daily.   9: Tracheostomy: Trach care per protocol -PCCM will follow-up weekly -PMV as tolerated        -not a candidate for decannulation 7/1-7/2 placed order for pt to be trained in trach care/esp inner cannula.Pt said nursing started training.   10: Hypertension: monitor  -BP has been well controlled overall 7/2- BP a [place] soft this Am, but asymptomatic- cont' regimen 11: Obstructive sleep apnea/OHS: nocturnal trach collar with humidity             -nocturnal O2 ordered prn to keep O2 sat 96%  7/2- Does drop when asleep- and goes back up when awake- con't night O2- willl need at home 12: Morbid obesity: dietary  counseling 13: Recurrent PE: continue Eliquis 560mBID 14: Anxiety: resolved; conitnue clonazepam prn 15: Hyperlipidemia: continue Lipitor 20 mg daily, counseling regarding diet 16. Knee pain suspected chronic OA -continue Voltaren gel ordered 17. Hypokalemia: discussed eating bananas, repeat Monday. 7/2- wasn't repleted- repleted 7/1 but K+ still 3.3 today after 80 mEq of KCL- so gave another 40 mEq BID today and will  recheck in AM  18. Suboptimal vitamin D level 31: prescribed ergocalciferol 50,000U once per week for 7 weeks. \I spent a total of    minutes on total care today- >50% coordination of care- due to    I spent a total of 35   minutes on total care today- >50% coordination of care- due to d/w pharmacy as well as d/w pt about K+ levels.    LOS: 4 days A FACE TO FACE EVALUATION WAS PERFORMED  Angel Brewer 01/12/2022, 9:24 AM

## 2022-01-12 NOTE — Plan of Care (Signed)
  Problem: Consults Goal: RH GENERAL PATIENT EDUCATION Description: See Patient Education module for education specifics. Outcome: Progressing Goal: Skin Care Protocol Initiated - if Braden Score 18 or less Description: If consults are not indicated, leave blank or document N/A Outcome: Progressing   Problem: RH SKIN INTEGRITY Goal: RH STG SKIN FREE OF INFECTION/BREAKDOWN Description: Skin to remain free from additional breakdown/infection with Mod I assist. Outcome: Progressing Goal: RH STG MAINTAIN SKIN INTEGRITY WITH ASSISTANCE Description: STG Maintain Skin Integrity With Mod I Assistance. Outcome: Progressing Goal: RH STG ABLE TO PERFORM INCISION/WOUND CARE W/ASSISTANCE Description: STG Able To Perform Incision/Wound Care With Mod I Assistance. Outcome: Progressing   Problem: RH SAFETY Goal: RH STG ADHERE TO SAFETY PRECAUTIONS W/ASSISTANCE/DEVICE Description: STG Adhere to Safety Precautions With Cues and Reminders. Outcome: Progressing Goal: RH STG DECREASED RISK OF FALL WITH ASSISTANCE Description: STG Decreased Risk of Fall With Mod I Assistance. Outcome: Progressing   Problem: RH PAIN MANAGEMENT Goal: RH STG PAIN MANAGED AT OR BELOW PT'S PAIN GOAL Description: < 3 on a 0-10 pain scale. Outcome: Progressing   Problem: RH KNOWLEDGE DEFICIT GENERAL Goal: RH STG INCREASE KNOWLEDGE OF SELF CARE AFTER HOSPITALIZATION Description: Patient will demonstrate knowledge of self-care management, medication management, skin/wound care management, trach care management with educational materials and handouts provided by staff independently at discharge. Outcome: Progressing

## 2022-01-13 DIAGNOSIS — I5042 Chronic combined systolic (congestive) and diastolic (congestive) heart failure: Secondary | ICD-10-CM | POA: Diagnosis not present

## 2022-01-13 DIAGNOSIS — J9620 Acute and chronic respiratory failure, unspecified whether with hypoxia or hypercapnia: Secondary | ICD-10-CM | POA: Diagnosis not present

## 2022-01-13 DIAGNOSIS — R7989 Other specified abnormal findings of blood chemistry: Secondary | ICD-10-CM | POA: Diagnosis not present

## 2022-01-13 DIAGNOSIS — I959 Hypotension, unspecified: Secondary | ICD-10-CM | POA: Diagnosis not present

## 2022-01-13 DIAGNOSIS — E876 Hypokalemia: Secondary | ICD-10-CM | POA: Diagnosis not present

## 2022-01-13 LAB — BASIC METABOLIC PANEL
Anion gap: 10 (ref 5–15)
Anion gap: 9 (ref 5–15)
BUN: 11 mg/dL (ref 6–20)
BUN: 12 mg/dL (ref 6–20)
CO2: 27 mmol/L (ref 22–32)
CO2: 27 mmol/L (ref 22–32)
Calcium: 9.4 mg/dL (ref 8.9–10.3)
Calcium: 9.2 mg/dL (ref 8.9–10.3)
Chloride: 102 mmol/L (ref 98–111)
Chloride: 103 mmol/L (ref 98–111)
Creatinine, Ser: 1.11 mg/dL — ABNORMAL HIGH (ref 0.44–1.00)
Creatinine, Ser: 1.19 mg/dL — ABNORMAL HIGH (ref 0.44–1.00)
GFR, Estimated: 58 mL/min — ABNORMAL LOW (ref >60)
GFR, Estimated: 54 mL/min — ABNORMAL LOW (ref >60)
Glucose, Bld: 91 mg/dL (ref 70–99)
Glucose, Bld: 92 mg/dL (ref 70–99)
Potassium: 3.9 mmol/L (ref 3.5–5.1)
Potassium: 3.5 mmol/L (ref 3.5–5.1)
Sodium: 139 mmol/L (ref 135–145)
Sodium: 139 mmol/L (ref 135–145)

## 2022-01-13 LAB — CBC
HCT: 39.2 % (ref 36.0–46.0)
Hemoglobin: 11.9 g/dL — ABNORMAL LOW (ref 12.0–15.0)
MCH: 28.2 pg (ref 26.0–34.0)
MCHC: 30.4 g/dL (ref 30.0–36.0)
MCV: 92.9 fL (ref 80.0–100.0)
Platelets: 261 10*3/uL (ref 150–400)
RBC: 4.22 MIL/uL (ref 3.87–5.11)
RDW: 14.8 % (ref 11.5–15.5)
WBC: 3.9 10*3/uL — ABNORMAL LOW (ref 4.0–10.5)
nRBC: 0 % (ref 0.0–0.2)

## 2022-01-13 MED ORDER — FUROSEMIDE 20 MG PO TABS
20.0000 mg | ORAL_TABLET | Freq: Every day | ORAL | Status: DC
  Administered 2022-01-14 – 2022-01-16 (×3): 20 mg via ORAL
  Filled 2022-01-13 (×3): qty 1

## 2022-01-13 NOTE — Progress Notes (Signed)
PROGRESS NOTE   Subjective/Complaints:  No new complaints or concerns this AM.  Working with therapy.   ROS:   Pt denies SOB, HA, abd pain, CP, N/V/C/D, and vision changes   Objective:   No results found. Recent Labs    01/13/22 0637  WBC 3.9*  HGB 11.9*  HCT 39.2  PLT 261    Recent Labs    01/12/22 0504 01/13/22 0637  NA 137 139  K 3.3* 3.9  CL 103 102  CO2 27 27  GLUCOSE 91 91  BUN 11 11  CREATININE 0.95 1.11*  CALCIUM 8.9 9.4     Intake/Output Summary (Last 24 hours) at 01/13/2022 0901 Last data filed at 01/13/2022 0809 Gross per 24 hour  Intake 712 ml  Output --  Net 712 ml         Physical Exam: Vital Signs Blood pressure (!) 108/41, pulse 75, temperature 98 F (36.7 C), temperature source Oral, resp. rate 16, height _0  (1.702 m), weight (!) 180.9 kg, last menstrual period 11/29/2015, SpO2 96 %.     General: awake, alert, appropriate, BMI 62; working with therapy  NAD HENT: conjugate gaze; oropharynx moist CV: regular rate; no JVD Pulmonary: CTA B/L; no W/R/R on RA GI: soft, NT, ND, (+)BS- protuberant Psychiatric: appropriate Neurological: Ox3  Skin: intact Neuro:  CN 2-12 intact, follow commands, Sensation intact in all 4 extremities, DTR normal and symmetric Strength 5/5 in all b/l UE Strength 4/5 in proximal b/l LE and 5/5 in distal b/l LE FTN intact Musculoskeletal: minimal knee tenderness to palpation No joint swelling noted Mild lumbar paraspinal tenderness  Assessment/Plan: 1. Functional deficits which require 3+ hours per day of interdisciplinary therapy in a comprehensive inpatient rehab setting. Physiatrist is providing close team supervision and 24 hour management of active medical problems listed below. Physiatrist and rehab team continue to assess barriers to discharge/monitor patient progress toward functional and medical goals  Care Tool:  Bathing    Body  parts bathed by patient: Front perineal area, Buttocks (cleaned in front of mirror)   Body parts bathed by helper: Buttocks, Right lower leg, Left lower leg     Bathing assist Assist Level: Supervision/Verbal cueing     Upper Body Dressing/Undressing Upper body dressing   What is the patient wearing?: Pull over shirt    Upper body assist Assist Level: Supervision/Verbal cueing    Lower Body Dressing/Undressing Lower body dressing      What is the patient wearing?: Pants     Lower body assist Assist for lower body dressing: Moderate Assistance - Patient 50 - 74%     Toileting Toileting    Toileting assist Assist for toileting: Independent     Transfers Chair/bed transfer  Transfers assist     Chair/bed transfer assist level: Supervision/Verbal cueing     Locomotion Ambulation   Ambulation assist      Assist level: Supervision/Verbal cueing Assistive device: No Device Max distance: 131f   Walk 10 feet activity   Assist     Assist level: Supervision/Verbal cueing Assistive device: No Device   Walk 50 feet activity   Assist    Assist level: Supervision/Verbal cueing  Assistive device: No Device    Walk 150 feet activity   Assist    Assist level: Supervision/Verbal cueing Assistive device: No Device    Walk 10 feet on uneven surface  activity   Assist Walk 10 feet on uneven surfaces activity did not occur: Safety/medical concerns (fatigue, weakness)         Wheelchair     Assist Is the patient using a wheelchair?: Yes Type of Wheelchair: Manual    Wheelchair assist level: Dependent - Patient 0% Max wheelchair distance: >172f    Wheelchair 50 feet with 2 turns activity    Assist        Assist Level: Dependent - Patient 0%   Wheelchair 150 feet activity     Assist      Assist Level: Dependent - Patient 0%   Blood pressure (!) 108/41, pulse 75, temperature 98 F (36.7 C), temperature source Oral,  resp. rate 16, height _0  (1.702 m), weight (!) 180.9 kg, last menstrual period 11/29/2015, SpO2 96 %.    Medical Problem List and Plan: 1. Functional deficits secondary to acute on chronic respiratory failure             -patient may shower             -ELOS/Goals: 5-7 days, PT/OT mod I            Con't CIR - PT and OT- con't continuous pulse ox.  2.  Antithrombotics: -DVT/anticoagulation:  Pharmaceutical: Other (comment)Eliquis             -antiplatelet therapy: none 3. Pain Management: continue Tylenol as needed, voltaren gel  7/1- pain controlled- con't regimen 4. Mood/Sleep: LCSW to evaluate and provide emotional support             -antipsychotic agents: n/a 5. Neuropsych/cognition: This patient is capable of making decisions on her own behalf. 6. Skin/Wound Care: Routine skin care checks 7. Fluids/Electrolytes/Nutrition: Strict Is and Os             -tolerating diet and does not want supplements 8: Acute on chronic heart failure with preserved ejection fraction -continue Farxiga 10 mg daily -continue Aldactone 25 mg daily -continue Lasix 40 mg daily -daily weight -strict Is and Os -follow BMP 7/2- weight stable- con't to check daily.   7/3 decrease lasix to 252mdaily due to mildly elevated Cr 9: Tracheostomy: Trach care per protocol -PCCM will follow-up weekly -PMV as tolerated        -not a candidate for decannulation 7/1-7/2 placed order for pt to be trained in trach care/esp inner cannula.Pt said nursing started training.   10: Hypertension: monitor  -BP has been well controlled overall 7/2- BP a Braver soft this Am, but asymptomatic- cont' regimen  7/3 well controlled 11: Obstructive sleep apnea/OHS: nocturnal trach collar with humidity             -nocturnal O2 ordered prn to keep O2 sat 96%  7/2- Does drop when asleep- and goes back up when awake- con't night O2- willl need at home 12: Morbid obesity: dietary counseling 13: Recurrent PE: continue Eliquis 19m7mBID 14: Anxiety: resolved; conitnue clonazepam prn 15: Hyperlipidemia: continue Lipitor 20 mg daily, counseling regarding diet 16. Knee pain suspected chronic OA -continue Voltaren gel ordered 17. Hypokalemia: discussed eating bananas, repeat Monday. 7/2- wasn't repleted- repleted 7/1 but K+ still 3.3 today after 80 mEq of KCL- so gave another 40 mEq BID today and will recheck in  AM   7/3 K 3.9 improved 18. Suboptimal vitamin D level 31: prescribed ergocalciferol 50,000U once per week for 7 weeks.  19. Mildly elevated creatinine 0.94  -Decrease lasix to 60m daily, adequate oral fluids   LOS: 5 days A FACE TO FACE EVALUATION WAS PERFORMED  YJennye Boroughs7/09/2021, 9:01 AM

## 2022-01-13 NOTE — Progress Notes (Signed)
Occupational Therapy Session Note  Patient Details  Name: Angel Brewer MRN: [id number] Date of Birth: Oct 02, 1965  Today's Date: 01/13/2022 OT Individual Time: 0900-1015 & 1415-1500 OT Individual Time Calculation (min): 75 min & 45 min   Short Term Goals: Week 1:  OT Short Term Goal 1 (Week 1): STG = LTG 2/2 ELOS  Skilled Therapeutic Interventions/Progress Updates:  Session 1 Skilled OT intervention completed with focus on home management education, ambulatory/standing endurance, posterior chain exercises. Pt received seated EOB, with PMSV already in place, on RA, with 95% SPO2 with no c/o pain. Education provided about showering at home with use of cervical collar and recommended bathing from shoulders down for breathing safety while wearing PMSV. Throughout session, pt ambulated without AD, with supervision, from room > main gym > ortho gym > back to room. No significant drop in O2 sat this session, with only one drop to 87% after longer duration of walking, demonstrating improved endurance and breathing strategies this date however remains at slow pace. Standing at high table, pt completed peg design (x2) with min cues needed for correcting areas, however pt with good tolerance with stance, with pt able to retrieve/donn gloves and wipe all items down as well with supervision. In ortho gym, pt participated in the following exercises to promote strengthening and endurance needed for powering up to stand from the commode and lower surfaces at home: -Mass practice sit > stands x10 from medium height with red theraband around bilateral knees for prevention of knee valgus; required supervision -Upgraded to sit > stand x10 with use of med ball in BUE holding at chest, to promote less dependence on hands being secured with HHA or pull up bar; CGA Pt remained at EOB with bed alarm on and all needs in reach at end of session.  Session 2 Skilled OT intervention completed with focus on DME education,  shower transfers/safety. Pt received upright in bed, c/o stiffness but no pain, declining intervention needed. On RA, with 95% SPO2, with PMSV already in place, with pt maintaining SPO2 >90% throughout session. Completed bed mobility with use of bed features with supervision, then sat EOB with Max A donning of shoes for time. NT in room for vitals.  Completed sit > stand with supervision from elevated bed, then ambulatory transfer with supervision to [facility] in bathroom, with therapist lowering the [place] as pt reported her legs couldn't touch the ground earlier. Pt was continent of urinary void and completed pericare and toileting steps with distant supervision.   Completed side step shower transfer in bathroom, with use of horizontal grab bars, over 3 in lip, with initial CGA with fading assist to supervision. Education provided about challenges with using shower seat to sit down including, that being her biggest barrier (sit > stands), as well as the requirement to remove the chair (which will need to be bariatric) in/out between showers so she has room to enter the framed door sideways. Discussed therapist's recommendations of grab bar placements and installation prior to showering, with maintaining stance during shower with use of LH sponge for LB bathing. Luke warm vs steamy water recommended for adequate breathing. Pt returned to EOB with supervision then supervision bed mobility, with pt left upright in bed, with bed alarm on all needs in reach at end of session.   Therapy Documentation Precautions:  Precautions Precautions: Fall Precaution Comments: PMSV, body habitus Restrictions Weight Bearing Restrictions: No    Therapy/Group: Individual Therapy  Blase Mess, MS, OTR/L  01/13/2022, 7:45  AM

## 2022-01-13 NOTE — Progress Notes (Signed)
Physical Therapy Session Note  Patient Details  Name: Angel Brewer MRN: [id number] Date of Birth: 1966/07/12  Today's Date: 01/13/2022 PT Individual Time: 0730-0828 PT Individual Time Calculation (min): 58 min   Short Term Goals: Week 1:  PT Short Term Goal 1 (Week 1): STG=LTG due to LOS  Skilled Therapeutic Interventions/Progress Updates:   Received pt sitting EOB ready for therapy. Pt agreeable to PT treatment and denied any pain during session. Session with emphasis on functional mobility/transfers, generalized strengthening and endurance, stair navigation, and gait training. Pt transferred sit<>stand without AD and supervision throughout session and ambulated 153f x 2 trials without AD and close supervision to/from room. O2 sats 85% increasing to 92% with seated rest and pursed lip breathing. Pt ambulated to staircase and navigated 4 6in steps with 2 rails and CGA ascending and descending with a step to pattern - increased knee pain when leading with LLE therefore transitioned to leading with RLE. Took seated rest break then navigated 8 3in steps without holding onto railings (to simulate home entry) - pt required CGA for balance and occasionally reaching out for wall on R side for stability. Worked on dynamic standing balance, UE strength, endurance, and reaction time bouncing 2.2lb medicine ball against rebounder for 1 minute x 3 trials with supervision. Transitioned to standing alternating toe taps to 3in step 2x15 without UE support and CGA/close supervision for balance. Worked on mini squats with 4.4lb medicine ball 3x12 reps with emphasis on quad strength. Finished with lateral monster squats along width of plinth 2x5 laps with close supervision with emphasis on quad and hip abductor strength. Pt required numerous seated rest breaks throughout session. O2 sats dropped as low as 80% during session, but pt able to quickly recover >92% with seated rest and pursed lip breathing within 1-2  minutes. Concluded session with pt sitting EOB with all needs within reach and RN present at bedside administering medications.   Therapy Documentation Precautions:  Precautions Precautions: Fall Precaution Comments: PMSV, body habitus Restrictions Weight Bearing Restrictions: No  Therapy/Group: Individual Therapy AAlfonse AlpersPT, DPT  01/13/2022, 6:47 AM

## 2022-01-13 NOTE — Discharge Instructions (Signed)
Inpatient Rehab Discharge Instructions  Angel Brewer Discharge date and time: 01/16/2022   Activities/Precautions/ Functional Status: Activity: no lifting, driving, or strenuous exercise until cleared by MD Diet: regular diet Wound Care: none needed Functional status:  ___ No restrictions     ___ Walk up steps independently ___ 24/7 supervision/assistance   ___ Walk up steps with assistance __x_ Intermittent supervision/assistance  ___ Bathe/dress independently ___ Walk with walker     ___ Bathe/dress with assistance ___ Walk Independently    ___ Shower independently ___ Walk with assistance    __x_ Shower with assistance _x__ No alcohol     ___ Return to work/school ________  Special Instructions:  No driving, alcohol consumption or tobacco use.   My questions have been answered and I understand these instructions. I will adhere to these goals and the provided educational materials after my discharge from the hospital.  Patient/Caregiver Signature _______________________________ Date __________  Clinician Signature _______________________________________ Date __________  Please bring this form and your medication list with you to all your follow-up doctor's appointments.

## 2022-01-14 DIAGNOSIS — E876 Hypokalemia: Secondary | ICD-10-CM | POA: Diagnosis not present

## 2022-01-14 DIAGNOSIS — J9620 Acute and chronic respiratory failure, unspecified whether with hypoxia or hypercapnia: Secondary | ICD-10-CM | POA: Diagnosis not present

## 2022-01-14 DIAGNOSIS — I959 Hypotension, unspecified: Secondary | ICD-10-CM | POA: Diagnosis not present

## 2022-01-14 DIAGNOSIS — I5042 Chronic combined systolic (congestive) and diastolic (congestive) heart failure: Secondary | ICD-10-CM | POA: Diagnosis not present

## 2022-01-14 DIAGNOSIS — R7989 Other specified abnormal findings of blood chemistry: Secondary | ICD-10-CM | POA: Diagnosis not present

## 2022-01-14 NOTE — Progress Notes (Signed)
Physical Therapy Discharge Summary  Patient Details  Name: Angel Brewer MRN: [id number] Date of Birth: 07/13/1966  Patient has met 7 of 8 long term goals due to improved activity tolerance, improved balance, improved postural control, increased strength, increased range of motion, and improved coordination. Patient to discharge at an ambulatory level Modified Independent without AD. Pt to discharge home alone, with intermittent assist from niece. Pt demonstrates good safety awareness and understanding of deficits as well as importance of rest breaks and various energy conservation strategies.   Reasons goals not met: Pt did not meet car transfer goal of supervision as pt politely refused to practice in simulated car reporting it was "not like her vehicle". Unable to arrange for family to bring actual car in to practice, however pt verbally talked therapist through her strategy to safely get in/out of car.  Recommendation:  Patient will benefit from ongoing skilled PT services in outpatient setting to continue to advance safe functional mobility, address ongoing impairments in functional mobility, generalized strengthening and endurance, dynamic standing balance/coordination, gait training, NMR, and to minimize fall risk.  Equipment: No equipment provided  Reasons for discharge: treatment goals met  Patient/family agrees with progress made and goals achieved: Yes  PT Discharge Precautions/Restrictions Precautions Precautions: Fall Precaution Comments: PMSV Restrictions Weight Bearing Restrictions: No Pain Interference Pain Interference Pain Effect on Sleep: 0. Does not apply - I have not had any pain or hurting in the past 5 days Pain Interference with Therapy Activities: 0. Does not apply - I have not received rehabilitationtherapy in the past 5 days Pain Interference with Day-to-Day Activities: 1. Rarely or not at all Cognition Overall Cognitive Status: Within Functional Limits for  tasks assessed Arousal/Alertness: Awake/alert Orientation Level: Oriented X4 Memory: Appears intact Awareness: Appears intact Problem Solving: Appears intact Safety/Judgment: Appears intact Sensation Sensation Light Touch: Appears Intact Proprioception: Appears Intact Additional Comments: generalized weakness/deconditioning Coordination Gross Motor Movements are Fluid and Coordinated: Yes Fine Motor Movements are Fluid and Coordinated: Yes Finger Nose Finger Test: [facility] bilaterally Heel Shin Test: unable to perform due to body habitus Motor  Motor Motor: Within Functional Limits Motor - Skilled Clinical Observations: generalized weakness/deconditioning  Mobility Bed Mobility Bed Mobility: Rolling Right;Rolling Left;Sit to Supine;Supine to Sit Rolling Right: Supervision/verbal cueing Rolling Left: Supervision/Verbal cueing Supine to Sit: Supervision/Verbal cueing Sit to Supine: Supervision/Verbal cueing Transfers Transfers: Sit to Stand;Stand to Sit;Stand Pivot Transfers Sit to Stand: Independent with assistive device Stand to Sit: Independent with assistive device Stand Pivot Transfers: Independent with assistive device Transfer (Assistive device): None Locomotion  Gait Ambulation: Yes Gait Assistance: Independent with assistive device Gait Distance (Feet): 150 Feet Assistive device: None Gait Gait: Yes Gait Pattern: Impaired Gait Pattern: Wide base of support;Decreased stride length;Poor foot clearance - left;Poor foot clearance - right Gait velocity: decreased Stairs / Additional Locomotion Stairs: Yes Stairs Assistance: Contact Guard/Touching assist Stair Management Technique: No rails (occasional use of wall for stability) Number of Stairs: 8 Height of Stairs: 3 Ramp: Supervision/Verbal cueing Wheelchair Mobility Wheelchair Mobility: No  Trunk/Postural Assessment  Cervical Assessment Cervical Assessment: Within Functional Limits Thoracic Assessment Thoracic  Assessment: Within Functional Limits Lumbar Assessment Lumbar Assessment: Exceptions to [facility] (anterior pelvic tilt due to body habitus) Postural Control Postural Control: Within Functional Limits  Balance Balance Balance Assessed: Yes Standardized Balance Assessment Standardized Balance Assessment: Berg Balance Test Berg Balance Test Sit to Stand: Able to stand without using hands and stabilize independently Standing Unsupported: Able to stand safely 2 minutes Sitting with Back Unsupported  but Feet Supported on Floor or Stool: Able to sit safely and securely 2 minutes Stand to Sit: Sits safely with minimal use of hands Transfers: Able to transfer safely, definite need of hands Standing Unsupported with Eyes Closed: Able to stand 10 seconds safely Standing Ubsupported with Feet Together: Able to place feet together independently and stand 1 minute safely From Standing, Reach Forward with Outstretched Arm: Can reach forward >12 cm safely (5") From Standing Position, Pick up Object from Floor: Unable to try/needs assist to keep balance From Standing Position, Turn to Look Behind Over each Shoulder: Looks behind one side only/other side shows less weight shift Turn 360 Degrees: Able to turn 360 degrees safely in 4 seconds or less Standing Unsupported, Alternately Place Feet on Step/Stool: Able to complete >2 steps/needs minimal assist Standing Unsupported, One Foot in Front: Able to plae foot ahead of the other independently and hold 30 seconds Standing on One Leg: Able to lift leg independently and hold > 10 seconds Total Score: 45 Static Sitting Balance Static Sitting - Balance Support: Feet supported;No upper extremity supported Static Sitting - Level of Assistance: 7: Independent Dynamic Sitting Balance Dynamic Sitting - Balance Support: Feet supported;No upper extremity supported Dynamic Sitting - Level of Assistance: 6: Modified independent (Device/Increase time) Static Standing  Balance Static Standing - Balance Support: During functional activity;No upper extremity supported Static Standing - Level of Assistance: 6: Modified independent (Device/Increase time) Dynamic Standing Balance Dynamic Standing - Balance Support: During functional activity;No upper extremity supported Dynamic Standing - Level of Assistance: 6: Modified independent (Device/Increase time) Dynamic Standing - Comments: with transfers and gait Extremity Assessment  RLE Assessment RLE Assessment: Exceptions to [facility] Strength Comments: limited by body habitus RLE Strength Right Hip Flexion: 4-/5 Right Hip ABduction: 4/5 Right Hip ADduction: 4/5 Right Knee Flexion: 4-/5 Right Knee Extension: 4-/5 Right Ankle Dorsiflexion: 4+/5 Right Ankle Plantar Flexion: 4+/5 LLE Assessment LLE Assessment: Exceptions to [facility] Strength Comments: limited by body habitus LLE Strength Left Hip Flexion: 4-/5 Left Hip ABduction: 4/5 Left Hip ADduction: 4/5 Left Knee Flexion: 4/5 Left Knee Extension: 4-/5 Left Ankle Dorsiflexion: 4/5 Left Ankle Plantar Flexion: 4/5  Jayleigh Notarianni M Imagene Gurney PT, DPT  01/14/2022, 10:35 AM

## 2022-01-14 NOTE — Progress Notes (Addendum)
Occupational Therapy Session Note  Patient Details  Name: Angel Brewer MRN: [id number] Date of Birth: 1965/08/24  Today's Date: 01/14/2022 OT Individual Time: 1105-1200 & 7510-2585 OT Individual Time Calculation (min): 55 min & 14 min   Short Term Goals: Week 1:  OT Short Term Goal 1 (Week 1): STG = LTG 2/2 ELOS  Skilled Therapeutic Interventions/Progress Updates:  Session 1 Skilled OT intervention completed with focus on higher level endurance and dynamic balance strategies. Pt received seated EOB, no c/o however fatigue but agreeable to session. PMSV in place, pt on RA, with 95% SPO2. All transfers completed with supervision at the ambulatory level without AD. Ambulated to the bathroom, continent of urinary void and completed all toileting with supervision. Ambulated to day room. Participated in basketball tossing with supervision for static/dynamic standing balance, with therapist retrieving ball, then pt catching bounced ball with supervision. Task upgraded to having pt multi-task bouncing/dribbing ball while walking >100 ft, with CGA, with pt losing the ball about 4 times, but with good safety awareness by not retrieving floor level ball. SPO2 at 85% following half of session duration, however improved to >92% with extended rest. Able to retrieve gloves/wipes and wipe ball down and return it to floor position with CGA for lean forward. Seated EOM, pt participated in the following BUE exercises to promote cardiovascular and functional endurance needed for self-care tasks:  (With red theraband) x15 reps Horizontal abduction Self-anchored shoulder flexion each arm Self-anchored bicep flexion each arm Therapist anchored scapular retraction  Shoulder external rotation  Multimodal cues needed for form and technique. To promote greater challenge with ambulation/cardiovascular endurance, therapist applied red theraband to pt's waist, then pt ambulated with light/mod resistance during ambulation  back to room around 100 ft with pt able to complete with no increase in SOB, and with supervision A. Did de-sat to 84% SPO2 once back in room but returned to >92%  with rest. Pt remained at EOB, with bed alarm on and all needs in reach at end of session.  Session 2 Skilled OT intervention completed with focus on laundry tasks, ambulatory endurance. Pt received seated EOB, no c/o pain, agreeable to unplanned session. SPO2 remained >90% throughout session with only minor SOB with ambulation, however recovered with standing rest break. Pt requesting to start a load of laundry, with pt completing the following with supervision: sit > stand without AD, ambulation throughout room to gather high level placed clothing, assemble clothes into a bag, carry the bag in one hand while ambulating to laundry room, dump laundry into washing machine, reach into high cabinet shelf for detergent, and start the load. Education provided about how to manage laundry tasks at home with energy conservation techniques. Ambulated to day room, where pt participated in ice cream social for July 4th activity, per clearance from nursing, with pt able to serve herself ice cream in stance with mod I, then ambulate back to room without spilling the ice cream. Pt remained at EOB, SPO2 > 92% and all immediate needs met at end of session.   Therapy Documentation Precautions:  Precautions Precautions: Fall Precaution Comments: PMSV, body habitus Restrictions Weight Bearing Restrictions: No    Therapy/Group: Individual Therapy  Blase Mess, MS, OTR/L  01/14/2022, 7:54 AM

## 2022-01-14 NOTE — Progress Notes (Signed)
Occupational Therapy Discharge Summary  Patient Details  Name: Angel Brewer MRN: [id number] Date of Birth: [birth year]   Patient has met 8 of 8 long term goals due to improved activity tolerance, improved balance, improved awareness, and cardiovascular endurance .  Patient to discharge at overall Modified Independent level.  Patient's niece plans to stay with pt for about a week upon returning home and is able to provide the necessary supervision assistance at discharge for higher level tasks such as showering at home for the first time, however pt will discharge at the overall mod I level.  Reasons goals not met: N/A  Recommendation:  No f/u OT  Equipment: No DME  Reasons for discharge: treatment goals met  Patient/family agrees with progress made and goals achieved: Yes  OT Discharge Precautions/Restrictions  Precautions Precautions: Fall Precaution Comments: PMSV Restrictions Weight Bearing Restrictions: No ADL ADL Equipment Provided: Long-handled sponge Eating: Independent Where Assessed-Eating: Edge of bed Grooming: Modified independent Where Assessed-Grooming: Standing at sink Upper Body Bathing: Modified independent Where Assessed-Upper Body Bathing: Shower Lower Body Bathing: Modified independent Where Assessed-Lower Body Bathing: Shower Upper Body Dressing: Modified independent (Device) Where Assessed-Upper Body Dressing: Standing at sink Lower Body Dressing: Modified independent Where Assessed-Lower Body Dressing: Edge of bed Toileting: Modified independent Where Assessed-Toileting: Bedside Commode, Control and instrumentation engineer Transfer: Modified independent Toilet Transfer Method: Counselling psychologist: Extra wide bedside commode, Grab bars Tub/Shower Transfer: Not assessed Tub/Shower Transfer Method: Unable to assess Social research officer, government: Modified independent Social research officer, government Method: Heritage manager: Grab  bars Vision Baseline Vision/History: 0 No visual deficits Patient Visual Report: No change from baseline Vision Assessment?: No apparent visual deficits Perception  Perception: Within Functional Limits Praxis Praxis: Intact Cognition   Sensation Sensation Light Touch: Appears Intact Proprioception: Appears Intact Additional Comments: pt reports tingling along R lateral thighs at baseline Coordination Gross Motor Movements are Fluid and Coordinated: Yes Fine Motor Movements are Fluid and Coordinated: Yes Finger Nose Finger Test: WFL bilaterally but slow Motor  Motor Motor: Within Functional Limits Motor - Skilled Clinical Observations: generalized weakness/deconditioning Mobility     Trunk/Postural Assessment     Balance   Extremity/Trunk Assessment RUE Assessment RUE Assessment: Within Functional Limits LUE Assessment LUE Assessment: Within Functional Limits   Liem Copenhaver E Stevi [place]inshead, MS, OTR/L  01/14/2022, 3:27 PM

## 2022-01-14 NOTE — Progress Notes (Signed)
Physical Therapy Session Note  Patient Details  Name: Angel Brewer MRN: [id number] Date of Birth: Dec 19, 1965  Today's Date: 01/14/2022 PT Individual Time: 6773-7366 PT Individual Time Calculation (min): 70 min   Short Term Goals: Week 1:  PT Short Term Goal 1 (Week 1): STG=LTG due to LOS  Skilled Therapeutic Interventions/Progress Updates:   Received pt sitting EOB ready for therapy. Pt agreeable to PT treatment and denied any pain during session. Session with emphasis on functional mobility/transfers, dressing, generalized strengthening and endurance, dynamic standing balance/coordination, NMR, and gait training. O2 sat 89% on RA at rest increasing to 95% with pursed lip breathing. Donned underwear, pants, and shoes with max A due to body habitus and pt stood without AD and supervision to pull pants over hips. Pt doffed dirty shirt and donned bra and pull over shirt with supervision in standing. Pt performed all transfers without AD and supervision throughout session. Pt ambulated 153f without AD and supervision to main therapy gym. Participated in BMingoand patient demonstrated increased fall risk noted by score of 45/56 on the [employer] (previous score 5 days prior 29/56).  <45/56 = fall risk, <42/56 = predictive of recurrent falls, <40/56 = 100% fall risk  >41 = independent, 21-40 = assistive device, 0-20 = wheelchair level  MDC 6.9 (4 pts 45-56, 5 pts 35-44, 7 pts 25-34) (ANPTA Core Set of Outcome Measures for Adults with Neurologic Conditions, 2018) Pt performed 2x12 standing mini squats with emphasis on quad strength, then performed the following activities in agility ladder with emphasis on dynamic standing balance: -forward walking x 4 laps placing 1 foot in each block with emphasis on foot clearance and increasing step/stride length with min A for balance  -lateral side stepping x 4 laps placing both feet into each block with min A for balance RN present requesting vitals and  pt then ambulated >2016fwithout AD and supervision back to room. O2 sats dropped as low as 82% during session but quickly recovered to 92-93% when pt stopped talking and focused on breathing techniques. Concluded session with pt sitting EOB with all needs within reach.   Therapy Documentation Precautions:  Precautions Precautions: Fall Precaution Comments: PMSV, body habitus Restrictions Weight Bearing Restrictions: No  Therapy/Group: Individual Therapy AnAlfonse AlpersT, DPT  01/14/2022, 6:56 AM

## 2022-01-14 NOTE — Progress Notes (Signed)
Occupational Therapy Session Note  Patient Details  Name: Angel Brewer MRN: [id number] Date of Birth: [birth year]  Today's Date: 01/14/2022 OT Individual Time: 1403-1500 OT Individual Time Calculation (min): 57 min    Short Term Goals: Week 1:  OT Short Term Goal 1 (Week 1): STG = LTG 2/2 ELOS  Skilled Therapeutic Interventions/Progress Updates:    Patient received seated edge of bed, agreeable to OT session. Skilled OT intervention with emphasis on improving activity tolerance, O2 monitoring with ADL, and increasing independence and safety with ADL.   Patient showered in room - seated on tub transfer bench.  Patient wore trach collar and did not wash above the neck to avoid any water in trach.  Patient able to maintain O2 sats throughout this activity.  Patient used Long handled bath sponge to aide with lower leg, and perianal care.  Bent handle of sponge to allow her better access to posterior body.  Adapted trach collar with additional hook and loop velcro for a more customized fit as current collar too tight for comfort.   Patient changed into clean hospital gown while awaiting laundry to complete cycle.  Patient able to use reacher and sock aide to help with donning and doffing clothing, and she has both tools at home.   Patient left in bed with all personal items within reach.    Therapy Documentation Precautions:  Precautions Precautions: Fall Precaution Comments: PMSV Restrictions Weight Bearing Restrictions: No  Vital signs:   O2 at 88% with walking activity.  Rest with focused breathing x 30 sec - up to 90%.   After shower 92%  Pain: Pain Assessment Pain Scale: 0-10 Pain Score: 0-No pain     Therapy/Group: Individual Therapy  Mariah Milling 01/14/2022, 3:13 PM

## 2022-01-14 NOTE — Progress Notes (Addendum)
PROGRESS NOTE   Subjective/Complaints:  She would like review of trach care. Will ask nursing to review.  ROS:   Pt denies SOB, HA, abd pain, cough, CP, N/V/C/D, and vision changes   Objective:   No results found. Recent Labs    01/13/22 0637  WBC 3.9*  HGB 11.9*  HCT 39.2  PLT 261     Recent Labs    01/13/22 0637 01/13/22 1631  NA 139 139  K 3.9 3.5  CL 102 103  CO2 27 27  GLUCOSE 91 92  BUN 11 12  CREATININE 1.11* 1.19*  CALCIUM 9.4 9.2     Intake/Output Summary (Last 24 hours) at 01/14/2022 1158 Last data filed at 01/14/2022 0825 Gross per 24 hour  Intake 660 ml  Output --  Net 660 ml         Physical Exam: Vital Signs Blood pressure 108/64, pulse 86, temperature 98 F (36.7 C), resp. rate 18, height _0  (1.702 m), weight (!) 178.8 kg, last menstrual period 11/29/2015, SpO2 96 %.     General: awake, alert, appropriate, BMI 62; sitting in bed, NAD HENT: conjugate gaze; oropharynx moist, trach in place CV: regular rate; no JVD Pulmonary: CTA B/L; no W/R/R on RA GI: soft, NT, ND, (+)BS- protuberant Psychiatric: appropriate Neurological: Ox3  Skin: intact Neuro:  CN 2-12 intact, follow commands, Sensation intact in all 4 extremities, DTR normal and symmetric Strength 5/5 in all b/l UE Strength 4/5 in proximal b/l LE and 5/5 in distal b/l LE FTN intact Musculoskeletal: minimal knee tenderness to palpation No joint swelling noted Mild lumbar paraspinal tenderness  Assessment/Plan: 1. Functional deficits which require 3+ hours per day of interdisciplinary therapy in a comprehensive inpatient rehab setting. Physiatrist is providing close team supervision and 24 hour management of active medical problems listed below. Physiatrist and rehab team continue to assess barriers to discharge/monitor patient progress toward functional and medical goals  Care Tool:  Bathing    Body parts  bathed by patient: Front perineal area, Buttocks (cleaned in front of mirror)   Body parts bathed by helper: Buttocks, Right lower leg, Left lower leg     Bathing assist Assist Level: Supervision/Verbal cueing     Upper Body Dressing/Undressing Upper body dressing   What is the patient wearing?: Pull over shirt    Upper body assist Assist Level: Supervision/Verbal cueing    Lower Body Dressing/Undressing Lower body dressing      What is the patient wearing?: Pants     Lower body assist Assist for lower body dressing: Moderate Assistance - Patient 50 - 74%     Toileting Toileting    Toileting assist Assist for toileting: Supervision/Verbal cueing     Transfers Chair/bed transfer  Transfers assist     Chair/bed transfer assist level: Supervision/Verbal cueing     Locomotion Ambulation   Ambulation assist      Assist level: Supervision/Verbal cueing Assistive device: No Device Max distance: >113f   Walk 10 feet activity   Assist     Assist level: Supervision/Verbal cueing Assistive device: No Device   Walk 50 feet activity   Assist    Assist level: Supervision/Verbal  cueing Assistive device: No Device    Walk 150 feet activity   Assist    Assist level: Supervision/Verbal cueing Assistive device: No Device    Walk 10 feet on uneven surface  activity   Assist Walk 10 feet on uneven surfaces activity did not occur: Safety/medical concerns (fatigue, weakness)         Wheelchair     Assist Is the patient using a wheelchair?: Yes Type of Wheelchair: Manual    Wheelchair assist level: Dependent - Patient 0% Max wheelchair distance: >164f    Wheelchair 50 feet with 2 turns activity    Assist        Assist Level: Dependent - Patient 0%   Wheelchair 150 feet activity     Assist      Assist Level: Dependent - Patient 0%   Blood pressure 108/64, pulse 86, temperature 98 F (36.7 C), resp. rate 18, height 5'  7" (1.702 m), weight (!) 178.8 kg, last menstrual period 11/29/2015, SpO2 96 %.    Medical Problem List and Plan: 1. Functional deficits secondary to acute on chronic respiratory failure             -patient may shower             -ELOS/Goals: 5-7 days, PT/OT mod I            Con't CIR - PT and OT- con't continuous pulse ox.  2.  Antithrombotics: -DVT/anticoagulation:  Pharmaceutical: Other (comment)Eliquis             -antiplatelet therapy: none 3. Pain Management: continue Tylenol as needed, voltaren gel  7/1- pain controlled- con't regimen 4. Mood/Sleep: LCSW to evaluate and provide emotional support             -antipsychotic agents: n/a 5. Neuropsych/cognition: This patient is capable of making decisions on her own behalf. 6. Skin/Wound Care: Routine skin care checks 7. Fluids/Electrolytes/Nutrition: Strict Is and Os             -tolerating diet and does not want supplements 8: Acute on chronic heart failure with preserved ejection fraction -continue Farxiga 10 mg daily -continue Aldactone 25 mg daily -continue Lasix 40 mg daily -daily weight -strict Is and Os -follow BMP 7/2- weight stable- con't to check daily.   7/3 decrease lasix to 282mdaily due to mildly elevated Cr -Recheck tomorrow, advised oral fluids 9: Tracheostomy: Trach care per protocol -PCCM will follow-up weekly -PMV as tolerated        -not a candidate for decannulation 7/1-7/2 placed order for pt to be trained in trach care/esp inner cannula.Pt said nursing started training.    7/4 asked nursing to provide additional training 10: Hypertension: monitor  -BP has been well controlled overall 7/2- BP a Vidrine soft this Am, but asymptomatic- cont' regimen  7/3 well controlled 11: Obstructive sleep apnea/OHS: nocturnal trach collar with humidity             -nocturnal O2 ordered prn to keep O2 sat 96%  7/2- Does drop when asleep- and goes back up when awake- con't night O2- willl need at home 12: Morbid  obesity: dietary counseling 13: Recurrent PE: continue Eliquis 17m1mID 14: Anxiety: resolved; conitnue clonazepam prn 15: Hyperlipidemia: continue Lipitor 20 mg daily, counseling regarding diet 16. Knee pain suspected chronic OA -continue Voltaren gel ordered 17. Hypokalemia: discussed eating bananas, repeat Monday. 7/2- wasn't repleted- repleted 7/1 but K+ still 3.3 today after 80 mEq of KCL- so  gave another 40 mEq BID today and will recheck in AM   7/3 K 3.9 improved  Recheck tomorrow 18. Suboptimal vitamin D level 31: prescribed ergocalciferol 50,000U once per week for 7 weeks.  19. Mildly elevated creatinine   -Decrease lasix to 7m daily, encourage oral fluids, recheck tomorrow   LOS: 6 days A FACE TO FACE EVALUATION WAS PERFORMED  YJennye Boroughs7/10/2021, 11:58 AM

## 2022-01-15 ENCOUNTER — Other Ambulatory Visit (HOSPITAL_COMMUNITY): Payer: Self-pay

## 2022-01-15 DIAGNOSIS — G4733 Obstructive sleep apnea (adult) (pediatric): Secondary | ICD-10-CM | POA: Diagnosis not present

## 2022-01-15 DIAGNOSIS — E785 Hyperlipidemia, unspecified: Secondary | ICD-10-CM | POA: Diagnosis not present

## 2022-01-15 DIAGNOSIS — J9620 Acute and chronic respiratory failure, unspecified whether with hypoxia or hypercapnia: Secondary | ICD-10-CM | POA: Diagnosis not present

## 2022-01-15 DIAGNOSIS — E876 Hypokalemia: Secondary | ICD-10-CM | POA: Diagnosis not present

## 2022-01-15 LAB — BASIC METABOLIC PANEL
Anion gap: 10 (ref 5–15)
BUN: 12 mg/dL (ref 6–20)
CO2: 27 mmol/L (ref 22–32)
Calcium: 9.1 mg/dL (ref 8.9–10.3)
Chloride: 103 mmol/L (ref 98–111)
Creatinine, Ser: 1.02 mg/dL — ABNORMAL HIGH (ref 0.44–1.00)
GFR, Estimated: >60 mL/min (ref >60)
Glucose, Bld: 94 mg/dL (ref 70–99)
Potassium: 3.5 mmol/L (ref 3.5–5.1)
Sodium: 140 mmol/L (ref 135–145)

## 2022-01-15 MED ORDER — ATORVASTATIN CALCIUM 20 MG PO TABS
20.0000 mg | ORAL_TABLET | Freq: Every day | ORAL | 0 refills | Status: DC
  Filled 2022-01-15: qty 90, 90d supply, fill #0

## 2022-01-15 MED ORDER — ADULT MULTIVITAMIN W/MINERALS CH: 1.0000 | ORAL_TABLET | Freq: Every day | ORAL | 1 refills | Status: DC

## 2022-01-15 MED ORDER — DICLOFENAC SODIUM 1 % EX GEL
4.0000 g | Freq: Four times a day (QID) | CUTANEOUS | 0 refills | Status: DC
  Filled 2022-01-15: qty 100, 30d supply, fill #0

## 2022-01-15 MED ORDER — VITAMIN D (ERGOCALCIFEROL) 1.25 MG (50000 UNIT) PO CAPS
50000.0000 [IU] | ORAL_CAPSULE | ORAL | 0 refills | Status: DC
  Filled 2022-01-15: qty 5, 35d supply, fill #0

## 2022-01-15 MED ORDER — DAPAGLIFLOZIN PROPANEDIOL 10 MG PO TABS
10.0000 mg | ORAL_TABLET | Freq: Every day | ORAL | 0 refills | Status: DC
  Filled 2022-01-15: qty 30, 30d supply, fill #0

## 2022-01-15 MED ORDER — FUROSEMIDE 20 MG PO TABS
20.0000 mg | ORAL_TABLET | Freq: Every day | ORAL | 0 refills | Status: DC
  Filled 2022-01-15: qty 30, 30d supply, fill #0

## 2022-01-15 MED ORDER — APIXABAN 5 MG PO TABS
5.0000 mg | ORAL_TABLET | Freq: Two times a day (BID) | ORAL | 0 refills | Status: DC
  Filled 2022-01-15: qty 60, 30d supply, fill #0

## 2022-01-15 MED ORDER — SPIRONOLACTONE 25 MG PO TABS
25.0000 mg | ORAL_TABLET | Freq: Every day | ORAL | 0 refills | Status: DC
  Filled 2022-01-15: qty 30, 30d supply, fill #0

## 2022-01-15 NOTE — Progress Notes (Signed)
Patient ID: Angel Brewer, female   DOB: 10-11-1965, 56 y.o.   MRN: [id number]  Team Conference Report to Patient/Family  Team Conference discussion was reviewed with the patient and caregiver, including goals, any changes in plan of care and target discharge date.  Patient and caregiver express understanding and are in agreement.  The patient has a target discharge date of 01/16/22.  Sw met with patient and informed patient that she is medically stable for discharge tomorrow. No DME recommendations and OP follow up. SW waiting on determination if patient will require 02 at home, no additional questions or concerns.   Dyanne Iha 01/15/2022, 12:27 PM

## 2022-01-15 NOTE — Progress Notes (Signed)
Physical Therapy Session Note  Patient Details  Name: Angel Brewer MRN: [id number] Date of Birth: 09-04-1965  Today's Date: 01/15/2022 PT Individual Time: 1400-1455 PT Individual Time Calculation (min): 55 min   Short Term Goals: Week 1:  PT Short Term Goal 1 (Week 1): STG=LTG due to LOS  Skilled Therapeutic Interventions/Progress Updates:   Received pt sitting EOB, pt agreeable to PT treatment, and denied any pain during session. Session with emphasis on discharge planning, functional mobility/transfers, generalized strengthening and endurance, and gait training. O2 sats 93% on RA. Pt performed all transfers without AD and Mod I throughout session (occasional need for increased time). Pt ambulated >317f without AD and mod I to ortho gym with 1 standing rest break. O2 sats dropped to 82% while ambulating but improved to >91% with pursed lip breathing. Pt ambulated 143fon uneven surfaces (ramp) without AD and supervision then took seated rest break. Worked on seated BUE strengthening on UBE at level 3 for 3 minutes forward and 3 minutes backwards - O2 sats 91%. Pt ambulated >30015fithout AD and mod I to dayroom with 1 standing rest break. Took seated rest break and performed 3x10 mini squats holding 3.3lb medicine ball with emphasis on quad strength. Ambulated 86f5fthout AD and mod I back to room and concluded session with pt sitting EOB with all needs within reach. O2 sats 92% on RA at rest and pt made mod I in room.   Therapy Documentation Precautions:  Precautions Precautions: Fall Precaution Comments: PMSV Restrictions Weight Bearing Restrictions: No  Therapy/Group: Individual Therapy AnnaAlfonse Alpers DPT  01/15/2022, 7:05 AM

## 2022-01-15 NOTE — Progress Notes (Addendum)
PROGRESS NOTE   Subjective/Complaints: Messaged Josh to see if trach care has been scheduled today She asks about trach supplies for home She asks about the thickened skin on her feet   ROS:   Pt denies SOB, HA, abd pain, cough, CP, N/V/C/D, and vision changes, +shortness of breath with exertion   Objective:   No results found. Recent Labs    01/13/22 0637  WBC 3.9*  HGB 11.9*  HCT 39.2  PLT 261    Recent Labs    01/13/22 1631 01/15/22 0528  NA 139 140  K 3.5 3.5  CL 103 103  CO2 27 27  GLUCOSE 92 94  BUN 12 12  CREATININE 1.19* 1.02*  CALCIUM 9.2 9.1    Intake/Output Summary (Last 24 hours) at 01/15/2022 0953 Last data filed at 01/15/2022 0748 Gross per 24 hour  Intake 480 ml  Output --  Net 480 ml        Physical Exam: Vital Signs Blood pressure (!) 116/50, pulse 71, temperature 98.3 F (36.8 C), temperature source Oral, resp. rate 18, height _0  (1.702 m), weight (!) 179.7 kg, last menstrual period 11/29/2015, SpO2 96 %.     General: awake, alert, appropriate, BMI 62.05; sitting in bed, NAD HENT: conjugate gaze; oropharynx moist, trach in place CV: regular rate; no JVD Pulmonary: CTA B/L; no W/R/R on RA, breathing comfortably on room air GI: soft, NT, ND, (+)BS- protuberant Psychiatric: appropriate Neurological: Ox3  Skin: intact Neuro:  CN 2-12 intact, follow commands, Sensation intact in all 4 extremities, DTR normal and symmetric Strength 5/5 in all b/l UE Strength 4/5 in proximal b/l LE and 5/5 in distal b/l LE FTN intact Musculoskeletal: minimal knee tenderness to palpation No joint swelling noted Mild lumbar paraspinal tenderness  Assessment/Plan: 1. Functional deficits which require 3+ hours per day of interdisciplinary therapy in a comprehensive inpatient rehab setting. Physiatrist is providing close team supervision and 24 hour management of active medical problems listed  below. Physiatrist and rehab team continue to assess barriers to discharge/monitor patient progress toward functional and medical goals  Care Tool:  Bathing    Body parts bathed by patient: Right arm, Left arm, Chest, Abdomen, Front perineal area, Buttocks, Right upper leg, Left upper leg, Right lower leg, Left lower leg, Face   Body parts bathed by helper: Buttocks, Right lower leg, Left lower leg     Bathing assist Assist Level: Independent with assistive device     Upper Body Dressing/Undressing Upper body dressing   What is the patient wearing?: Pull over shirt    Upper body assist Assist Level: Independent with assistive device    Lower Body Dressing/Undressing Lower body dressing      What is the patient wearing?: Pants     Lower body assist Assist for lower body dressing: Independent with assitive device Assistive Device Comment: reacher   Toileting Toileting    Toileting assist Assist for toileting: Independent with assistive device     Transfers Chair/bed transfer  Transfers assist     Chair/bed transfer assist level: Independent with assistive device     Locomotion Ambulation   Ambulation assist  Assist level: Independent with assistive device Assistive device: No Device Max distance: 132f   Walk 10 feet activity   Assist     Assist level: Independent with assistive device Assistive device: No Device   Walk 50 feet activity   Assist    Assist level: Independent with assistive device Assistive device: No Device    Walk 150 feet activity   Assist    Assist level: Independent with assistive device Assistive device: No Device    Walk 10 feet on uneven surface  activity   Assist Walk 10 feet on uneven surfaces activity did not occur: Safety/medical concerns (fatigue, weakness)   Assist level: Supervision/Verbal cueing     Wheelchair     Assist Is the patient using a wheelchair?: No Type of Wheelchair:  Manual    Wheelchair assist level: Dependent - Patient 0% Max wheelchair distance: >1531f   Wheelchair 50 feet with 2 turns activity    Assist        Assist Level: Dependent - Patient 0%   Wheelchair 150 feet activity     Assist      Assist Level: Dependent - Patient 0%   Blood pressure (!) 116/50, pulse 71, temperature 98.3 F (36.8 C), temperature source Oral, resp. rate 18, height _0  (1.702 m), weight (!) 179.7 kg, last menstrual period 11/29/2015, SpO2 96 %.    Medical Problem List and Plan: 1. Functional deficits secondary to acute on chronic respiratory failure             -patient may shower             -ELOS/Goals: 8 days, PT/OT mod I goals, CG for steps, recommend outpatient PT  Refusing car transfer with our car            Con't CIR - PT and OT- con't continuous pulse ox.   Able to do standing shower with good energy level, no OT f/u needed  -Interdisciplinary Team Conference today   2.  Antithrombotics: -DVT/anticoagulation:  Pharmaceutical: Other (comment)Eliquis             -antiplatelet therapy: none 3. Pain Management: continue Tylenol as needed, voltaren gel 4. Mood/Sleep: LCSW to evaluate and provide emotional support             -antipsychotic agents: n/a 5. Neuropsych/cognition: This patient is capable of making decisions on her own behalf. 6. Thickened skin bilateral feet: will place outpatient podiatry consult.  7. Fluids/Electrolytes/Nutrition: Strict Is and Os             -tolerating diet and does not want supplements 8: Acute on chronic heart failure with preserved ejection fraction -continue Farxiga 10 mg daily -continue Aldactone 25 mg daily -continue Lasix 40 mg daily -daily weight -strict Is and Os -follow BMP 7/2- weight stable- con't to check daily.   7/3 decrease lasix to 2071maily due to mildly elevated Cr -Recheck tomorrow, advised oral fluids 9: Tracheostomy: Trach care per protocol -PCCM will follow-up weekly -PMV  as tolerated        -not a candidate for decannulation 7/1-7/2 placed order for pt to be trained in trach care/esp inner cannula.Pt said nursing started training.    7/4 asked nursing to provide additional training 7/5: [place] tr[place] care today/ordering supplies for her for home 10: Hypertension: monitor  -BP has been well controlled overall 7/2- BP a Baskett soft this Am, but asymptomatic- cont' regimen  7/3 well controlled 11: Obstructive sleep apnea/OHS:  nocturnal trach collar with humidity             -nocturnal O2 ordered prn to keep O2 sat 96%  7/2- Does drop when asleep- and goes back up when awake- con't night O2- willl need at home  7/5: f/u with NP [name] outpatient at trach clinic outpatient 12: Morbid obesity: dietary counseling 13: Recurrent PE: continue Eliquis 74m BID 14: Anxiety: resolved; conitnue clonazepam prn 15: Hyperlipidemia: continue Lipitor 20 mg daily, counseling regarding diet 16. Knee pain suspected chronic OA -continue Voltaren gel ordered 17. Hypokalemia: discussed eating bananas, repeat Monday. 7/2- wasn't repleted- repleted 7/1 but K+ still 3.3 today after 80 mEq of KCL- so gave another 40 mEq BID today and will recheck in AM   7/3 K 3.9 improved  Recheck tomorrow 18. Suboptimal vitamin D level 31: prescribed ergocalciferol 50,000U once per week for 7 weeks.  19. Mildly elevated creatinine   -Decrease lasix to 273mdaily, encourage oral fluids, creatinine improved   LOS: 7 days A FACE TO FACE EVALUATION WAS PERFORMED  KrClide Deutscheraulkar 01/15/2022, 9:53 AM

## 2022-01-15 NOTE — Patient Care Conference (Signed)
Inpatient RehabilitationTeam Conference and Plan of Care Update Date: 01/15/2022   Time: 11:40 AM    Patient Name: Angel Brewer      Medical Record Number: [id number]  Date of Birth: Aug 03, 1965 Sex: Female         Room/Bed: 4W09C/4W09C-01 Payor Info: Payor: [place] / Plan: [place] / Product Type: *No Product type* /    Admit Date/Time:  01/08/2022  3:13 PM  Primary Diagnosis:  Respiratory failure [facility])  Hospital Problems: Principal Problem:   Respiratory failure [facility])    Expected Discharge Date: Expected Discharge Date: 01/16/22  Team Members Present: Physician leading conference: Dr. Leeroy Cha Social Worker Present: Erlene Quan, BSW Nurse Present: Dorthula Nettles, RN PT Present: Becky Sax, PT OT Present: Jennefer Bravo, OT SLP Present: Helaine Chess, SLP PPS Coordinator present : Gunnar Fusi, SLP     Current Status/Progress Goal Weekly Team Focus  Bowel/Bladder   continent of bowl and bladder  remain con't  of B/B  Timed toileting   Swallow/Nutrition/ Hydration             ADL's   Supervision bathing at the standing level (sinkside or shower), supervision dressing when using AE otherwise min A, supervision toileting  Mod I  cardiovascular endurance, safety education, d/c planning, standing shower tolerance   Mobility   bed mobility supervision using bed features, transfers without AD and supervision, gait 18f without AD and supervision, 8 3in steps (occasionally holding onto wall for support) and CGA.  Mod I, CGA steps  energy conservation strategies, functional mobility/transfers, generalized strengthening and endurance, dynamic standing balance/coordination, gait training, stair navigation, and D/C planning.   Communication   passy muir valve for speech  PCon-waycare with trach care education for discharge  Pt is able to care for trach and passy MDamita Lackupon discharge   Safety/Cognition/ Behavioral  Observations            Pain   Pt denies pain or discomfort PRN pain meds if needed  assess pain with proper interventions  PT will manage pain by asking for PRN medications   Skin   redness in skin folds  Keep skin clean dry and intact  Clean intact skin without breakdown     Discharge Planning:  Discharging home on Thursday, NO DME and NEURO OP referral   Team Discussion: Trach care training today at 12 PM. Set-up for podiatry post discharge. Will need O2 at HS. Medically ready for discharge. Continent B/B, no reported pain. Discharging home. Recommending outpatient PT.  Patient on target to meet rehab goals: yes, mod I, CGA stairs goals. Met all goals. Reports car transfer will be successful.   *See Care Plan and progress notes for long and short-term goals.   Revisions to Treatment Plan:  Finalizing discharge plans.   Teaching Needs: Family education completed   Current Barriers to Discharge: Decreased caregiver support, Home enviroment access/layout, Trach, Lack of/limited family support, and Weight  Possible Resolutions to Barriers: Family education Set-up follow up appointments Follow-up at trach clinic     Medical Summary Current Status: morbid obesity BMI 62.05, new trach, shortness of breath with exertion, thickening of skin on bilateral feet, acute kidney injury, obesity hypoventilation syndrome  Barriers to Discharge: Medical stability;Weight;Wound care;New oxygen  Barriers to Discharge Comments: morbid obesity BMI 62.05, new trach, shortness of breath with exertion, thickening of skin on bilateral feet, acute kidney injury, obesity hypoventilation syndrome Possible Resolutions to [employer]: provide dietary  education, provide trach care education, continue oxygen HS, scheduled follow-up with trach care clinic, decrease Lasix to 37m   Continued Need for Acute Rehabilitation Level of Care: The patient requires daily medical management by a physician  with specialized training in physical medicine and rehabilitation for the following reasons: Direction of a multidisciplinary physical rehabilitation program to maximize functional independence : Yes Medical management of patient stability for increased activity during participation in an intensive rehabilitation regime.: Yes Analysis of laboratory values and/or radiology reports with any subsequent need for medication adjustment and/or medical intervention. : Yes   I attest that I was present, lead the team conference, and concur with the assessment and plan of the team.   JCristi Loron7/11/2021, 12:03 PM

## 2022-01-15 NOTE — Progress Notes (Signed)
Occupational Therapy Session Note  Patient Details  Name: Angel Brewer MRN: [id number] Date of Birth: 05-Feb-1966  Today's Date: 01/15/2022 OT Individual Time: 7654-6503 OT Individual Time Calculation (min): 75 min    Short Term Goals: Week 1:  OT Short Term Goal 1 (Week 1): STG = LTG 2/2 ELOS  Skilled Therapeutic Interventions/Progress Updates:  Skilled OT intervention completed with focus on functional endurance within a shower context at an upgraded task demand level. Pt received seated EOB, no c/o pain, agreeable to session. Pt on RA, with SPO2 >94%.Therapist encouraged pt to shower at a standing level this session, to increase endurance challenge as well as to better simulate her routine she will do at home alone. Doffed socks with reacher, with pt stating "my feet are slippery." Education provided about wearing slip on shoes vs socks to get into shower 2/2 body habitus and difficulty with removing socks in stance. Pt completed all transfers within session at the ambulatory mod I level without AD and with good safety awareness.   Pt donned cervical trach guard with mod I. Shower transfer via side step with use of grab bars, then for duration of shower pt maintained stance, with good O2 sat however did de-sat to 87% after shower but quickly recovered with pursed lip breathing. Pt utilized LH sponge for reaching all body parts, and demonstrated good awareness with using [facility] for spraying shoulders and below. Good recall of using luke warm water vs hot for steam prevention. Discussed ways to prevent steam at home as well I.e. keeping bathroom door open for ventilation.   Seated EOB, pt with significant skin build up on bilateral feet on the plantar aspects, with therapist scrubbing and applying lotion for skin integrity. Education provided about probable reasons for build up including extended immobility as well as ways to manage such as visiting a podiatrist for skin health with mention to MD in  room on rounds for potential referral. Pt utilized reacher for donning LB clothing and sock aid for socks with mod I. Donned shirt with mod I. Grooming at the stance level at sink. Concluded session with ambulating around rehab unit for cardiovascular endurance around 100 ft. Pt remained at EOB at end of session with therapist clearing pt for independence within her room and staff updated on the status. All immediate needs met.   Therapy Documentation Precautions:  Precautions Precautions: Fall Precaution Comments: PMSV Restrictions Weight Bearing Restrictions: No   Therapy/Group: Individual Therapy  Blase Mess, MS, OTR/L  01/15/2022, 7:26 AM

## 2022-01-15 NOTE — Progress Notes (Signed)
Inpatient Rehabilitation Care Coordinator Discharge Note   Patient Details  Name: Angel Brewer MRN: [id number] Date of Birth: 1966-01-31   Discharge location: Home  Length of Stay: 8 Days  Discharge activity level: MOD I  Home/community participation: Son  Patient response SN:[id number] Literacy - How often do you need to have someone help you when you read instructions, pamphlets, or other written material from your doctor or pharmacy?: Never  Patient response BH:ALPFXT Isolation - How often do you feel lonely or isolated from those around you?: Never  Services provided included: SW, Neuropsych, Pharmacy, TR, CM, RN, SLP, OT, PT, RD, MD  Financial Services:  Financial Services Utilized: Medicaid    Choices offered to/list presented to: patient  Follow-up services arranged:  Outpatient    Outpatient Servicies: Neuro OP      Patient response to transportation need: Is the patient able to respond to transportation needs?: Yes In the past 12 months, has lack of transportation kept you from medical appointments or from getting medications?: No In the past 12 months, has lack of transportation kept you from meetings, work, or from getting things needed for daily living?: No    Comments (or additional information):  Patient/Family verbalized understanding of follow-up arrangements:  Yes  Individual responsible for coordination of the follow-up plan: self or son  Confirmed correct DME delivered: Dyanne Iha 01/15/2022    Dyanne Iha

## 2022-01-15 NOTE — Progress Notes (Signed)
RT NOTE: RT gave trach education and went over cleaning inner cannula _0 . Patient told RT that she already had been given a book and was told some education but needed to know how to clean the trach and to know about the inner cannula. Patient took out inner cannula, cleaned inner cannula and placed it back properly. After RT explained the importance of cleaning inner cannula to ensure the airway is clear. The patient told RT that she understood and we discussed that she can check the inner cannula multiple times if she would like.  The patient did very well. RT will continue to monitor.

## 2022-01-15 NOTE — Progress Notes (Addendum)
Patient ID: Angel Brewer, female   DOB: 1966/04/18, 56 y.o.   MRN: [id number]  Home 02 set up ordered in case patient will require at discharge. Sw waiting on follow up to confirm. Patient prefers [facility] due to transportation. Pt requested to have medication filled here at hospital, PA informed.  [facility] referral sent to [facility]

## 2022-01-15 NOTE — Progress Notes (Signed)
Occupational Therapy Session Note  Patient Details  Name: Angel Brewer MRN: [id number] Date of Birth: 02-24-1966  Today's Date: 01/15/2022 OT Individual Time: 1000-1100 OT Individual Time Calculation (min): 60 min    Short Term Goals: Week 1:  OT Short Term Goal 1 (Week 1): STG = LTG 2/2 ELOS  Skilled Therapeutic Interventions/Progress Updates:  Pt awake seated in EOB upon OT arrival to the room. Pt reports, "I just have a hard time breathing the right way while doing things." Pt in agreement for OT session. Pt participates in functional mobility, functional endurance, and education for energy conservation/monitoring O2. See details of therapy session below.    Therapy Documentation Precautions:  Precautions Precautions: Fall Precaution Comments: PMSV Restrictions Weight Bearing Restrictions: No Vital Signs (last charted vitals from nursing): Therapy Vitals Pulse Rate: 71 Resp: 18 Patient Position (if appropriate): Lying Oxygen Therapy SpO2: 96 % O2 Device: Room Air Pain: Pain Assessment Pain Scale: 0-10 Pain Score: 0-No pain ADL: Grooming: Independent (Pt able to complete hand hygiene standing at the sink independently.) Where Assessed-Grooming: Standing at sink Toileting: Modified independent (Pt able to complete toileting tasks with modified independence.) Where Assessed-Toileting: Glass blower/designer: Modified independent (Pt able to complete toilet transfer with modified independence and use of grab bars as needed.) Toilet Transfer Method: Ambulating  Functional Endurance: Pt participates in functional endurance by ambulating from room <> therapy gym while practicing proper breathing techniques (PLB) in order to assist with more independence SPO2 management. Pt able to ambulate with supervision with minimal prompts for proper breathing techniques and to use pulse ox to manage SPO2 levels. Pt demo's limited signs of low SPO2 levels and SPO2 decreased to 84% at the  lowest with pt requiring a standing rest break to recover to >88-92%. Pt able to ascend/descend small steps with good safety awareness and supervision. Pt's SPO2 decreased to 87% after this task. Education provided to pt on importance of practicing PLB during functional tasks and techniques to coordinate movements to practice breathing. Ex.) breathing in for 2 steps and breathing out for 2 steps. Pt then practiced this technique back to the room. However, O2 decreased to 87%. Encouraged pt to continue to practice this and take more standing rest breaks to keep SPO2 in a safe range. Encouraged pt to have pulse ox on finger during any ambulation in order to learn more signs of low O2 levels and to indicate the need for a rest break. Pt able to ambulate from room <> therapy gym >100' with no AD, various standing rest breaks, and supervision for safety.   Energy Conservation: Education and handout provided to pt on energy conservation techniques. Education provided to pt on importance of prioritizing tasks, breaking tasks into smaller steps, taking rest breaks completing tasks sitting vs standing, planning the day, and equipment that ca decrease the energy needed to complete tasks. Encouraged pt to discuss these techniques with family members to assist with setting up the home to support energy conservation techniques.    Pt returned to sitting at EOB at end of session. Pt left resting comfortably seated at EOB with personal belongings and call light within reach and comfort needs attended to.   Therapy/Group: Individual Therapy  Angel Brewer 01/15/2022, 11:03 AM

## 2022-01-16 DIAGNOSIS — R0602 Shortness of breath: Secondary | ICD-10-CM | POA: Diagnosis not present

## 2022-01-16 DIAGNOSIS — Z930 Tracheostomy status: Secondary | ICD-10-CM

## 2022-01-16 DIAGNOSIS — N179 Acute kidney failure, unspecified: Secondary | ICD-10-CM | POA: Diagnosis not present

## 2022-01-16 DIAGNOSIS — J9620 Acute and chronic respiratory failure, unspecified whether with hypoxia or hypercapnia: Secondary | ICD-10-CM | POA: Diagnosis not present

## 2022-01-16 DIAGNOSIS — J9690 Respiratory failure, unspecified, unspecified whether with hypoxia or hypercapnia: Secondary | ICD-10-CM | POA: Diagnosis not present

## 2022-01-16 NOTE — Progress Notes (Signed)
Inpatient Rehabilitation Discharge Medication Review by a Pharmacist  A complete drug regimen review was completed for this patient to identify any potential clinically significant medication issues.  High Risk Drug Classes Is patient taking? Indication by Medication  Antipsychotic No   Anticoagulant Yes Apixaban - recurrent PE  Antibiotic No   Opioid No   Antiplatelet No   Hypoglycemics/insulin Yes Dapagliflozin - DM  Vasoactive Medication Yes Furosemide, Spironolactone - fluid  Chemotherapy No   Other Yes Atorvastatin - HLD Diclofenac - topical pain relief Dicyclomine - PRN spasms Vitamin D, MV - supplement     Type of Medication Issue Identified Description of Issue Recommendation(s)  Drug Interaction(s) (clinically significant)     Duplicate Therapy     Allergy     No Medication Administration End Date     Incorrect Dose     Additional Drug Therapy Needed     Significant med changes from prior encounter (inform family/care partners about these prior to discharge). PTA medications discontinued: - albuterol, clonazepam, dicyclomine, ondansetron, pantoprazole Communicate medication changes with patient/family at discharge  Other       Clinically significant medication issues were identified that warrant physician communication and completion of prescribed/recommended actions by midnight of the next day:  No   Pharmacist comments: n/a   Time spent performing this drug regimen review (minutes): 20   Thank you for allowing pharmacy to be a part of this patient's care.  Ardyth Harps, PharmD Clinical Pharmacist

## 2022-01-16 NOTE — Progress Notes (Signed)
PROGRESS NOTE   Subjective/Complaints: Angel Brewer is ready for d/c today She feels comfortable with trach care after education session with RT yesterday She is waiting for medications to arrive to her room   ROS:   Pt denies SOB, HA, abd pain, cough, CP, N/V/C/D, and vision changes, +shortness of breath with exertion   Objective:   No results found. No results for input(s): "WBC", "HGB", "HCT", "PLT" in the last 72 hours.   Recent Labs    01/13/22 1631 01/15/22 0528  NA 139 140  K 3.5 3.5  CL 103 103  CO2 27 27  GLUCOSE 92 94  BUN 12 12  CREATININE 1.19* 1.02*  CALCIUM 9.2 9.1    Intake/Output Summary (Last 24 hours) at 01/16/2022 1035 Last data filed at 01/15/2022 2021 Gross per 24 hour  Intake 480 ml  Output --  Net 480 ml        Physical Exam: Vital Signs Blood pressure 115/64, pulse 65, temperature 98 F (36.7 C), resp. rate 18, height _0  (1.702 m), weight (!) 179.8 kg, last menstrual period 11/29/2015, SpO2 100 %.     General: awake, alert, appropriate, BMI 62.05; sitting in bed, NAD Gen: no distress, normal appearing HEENT: oral mucosa pink and moist, NCAT Cardio: Reg rate Chest: normal effort, normal rate of breathing Abd: soft, non-distended Ext: no edema Psych: pleasant, normal affect  Skin: intact Neuro:  CN 2-12 intact, follow commands, Sensation intact in all 4 extremities, DTR normal and symmetric Strength 5/5 in all b/l UE Strength 4/5 in proximal b/l LE and 5/5 in distal b/l LE FTN intact Musculoskeletal: minimal knee tenderness to palpation No joint swelling noted Mild lumbar paraspinal tenderness  Assessment/Plan: 1. Functional deficits which require 3+ hours per day of interdisciplinary therapy in a comprehensive inpatient rehab setting. Physiatrist is providing close team supervision and 24 hour management of active medical problems listed below. Physiatrist and rehab  team continue to assess barriers to discharge/monitor patient progress toward functional and medical goals  Care Tool:  Bathing    Body parts bathed by patient: Right arm, Left arm, Chest, Abdomen, Front perineal area, Buttocks, Right upper leg, Left upper leg, Right lower leg, Left lower leg, Face   Body parts bathed by helper: Buttocks, Right lower leg, Left lower leg     Bathing assist Assist Level: Independent with assistive device     Upper Body Dressing/Undressing Upper body dressing   What is the patient wearing?: Pull over shirt    Upper body assist Assist Level: Independent with assistive device    Lower Body Dressing/Undressing Lower body dressing      What is the patient wearing?: Pants     Lower body assist Assist for lower body dressing: Independent with assitive device Assistive Device Comment: reacher   Toileting Toileting    Toileting assist Assist for toileting: Independent with assistive device     Transfers Chair/bed transfer  Transfers assist     Chair/bed transfer assist level: Independent     Locomotion Ambulation   Ambulation assist      Assist level: Independent with assistive device Assistive device: No Device Max distance: 150f  Walk 10 feet activity   Assist     Assist level: Independent with assistive device Assistive device: No Device   Walk 50 feet activity   Assist    Assist level: Independent with assistive device Assistive device: No Device    Walk 150 feet activity   Assist    Assist level: Independent with assistive device Assistive device: No Device    Walk 10 feet on uneven surface  activity   Assist Walk 10 feet on uneven surfaces activity did not occur: Safety/medical concerns (fatigue, weakness)   Assist level: Supervision/Verbal cueing     Wheelchair     Assist Is the patient using a wheelchair?: No Type of Wheelchair: Manual    Wheelchair assist level: Dependent - Patient  0% Max wheelchair distance: >184f    Wheelchair 50 feet with 2 turns activity    Assist        Assist Level: Dependent - Patient 0%   Wheelchair 150 feet activity     Assist      Assist Level: Dependent - Patient 0%   Blood pressure 115/64, pulse 65, temperature 98 F (36.7 C), resp. rate 18, height _0  (1.702 m), weight (!) 179.8 kg, last menstrual period 11/29/2015, SpO2 100 %.    Medical Problem List and Plan: 1. Functional deficits secondary to acute on chronic respiratory failure             -patient may shower             -ELOS/Goals: 8 days, PT/OT mod I goals, CG for steps, recommend outpatient PT  Refusing car transfer with our car           d/c home today  Able to do standing shower with good energy level, no OT f/u needed  -Interdisciplinary Team Conference today   2.  Antithrombotics: -DVT/anticoagulation:  Pharmaceutical: Other (comment)Eliquis             -antiplatelet therapy: none 3. Pain Management: continue Tylenol as needed, voltaren gel 4. Mood/Sleep: LCSW to evaluate and provide emotional support             -antipsychotic agents: n/a 5. Neuropsych/cognition: This patient is capable of making decisions on her own behalf. 6. Thickened skin bilateral feet: will place outpatient podiatry consult.  7. Fluids/Electrolytes/Nutrition: Strict Is and Os             -tolerating diet and does not want supplements 8: Acute on chronic heart failure with preserved ejection fraction -continue Farxiga 10 mg daily -continue Aldactone 25 mg daily -continue Lasix 40 mg daily -daily weight -strict Is and Os -follow BMP 7/2- weight stable- con't to check daily.   7/3 decrease lasix to 244mdaily due to mildly elevated Cr -Recheck tomorrow, advised oral fluids 9: Tracheostomy: Trach care per protocol -PCCM will follow-up weekly -PMV as tolerated        -not a candidate for decannulation 7/1-7/2 placed order for pt to be trained in trach care/esp inner  cannula.Pt said nursing started training.    7/4 asked nursing to provide additional training 7/5: MeVincentegarding trach care today/ordering supplies for her for home 10: Hypertension: monitor  -BP has been well controlled overall 7/2- BP a [place] soft this Am, but asymptomatic- cont' regimen  7/3 well controlled 11: Obstructive sleep apnea/OHS: nocturnal trach collar with humidity             -nocturnal O2 ordered prn to keep O2 sat 96%  7/2-  Does drop when asleep- and goes back up when awake- con't night O2- willl need at home  7/5: f/u with NP [name] outpatient at trach clinic outpatient 12: Morbid obesity: dietary counseling 13: Recurrent PE: continue Eliquis 89m BID 14: Anxiety: resolved; conitnue clonazepam prn 15: Hyperlipidemia: continue Lipitor 20 mg daily, counseling regarding diet 16. Knee pain suspected chronic OA -continue Voltaren gel ordered 17. Hypokalemia: discussed eating bananas, repeat Monday. 7/2- wasn't repleted- repleted 7/1 but K+ still 3.3 today after 80 mEq of KCL- so gave another 40 mEq BID today and will recheck in AM   7/3 K 3.9 improved  Recheck tomorrow 18. Suboptimal vitamin D level 31: prescribed ergocalciferol 50,000U once per week for 7 weeks.  19. Mildly elevated creatinine   -Continue lower dose of lasix at 212mdaily, follow-up with PCP for further titration, encourage oral fluids, creatinine improved   >30 minutes spent in discharge of patient including review of medications and follow-up appointments, physical examination, and in answering all patient's questions    LOS: 8 days A FACE TO FACE EVALUATION WAS [place]/12/2021, 10:35 AM

## 2022-01-20 ENCOUNTER — Telehealth: Payer: Self-pay

## 2022-01-20 NOTE — Telephone Encounter (Signed)
Transition Care Management Follow-up Telephone Call Date of discharge and from where: 01/16/2022, [facility]  How have you been since you were released from the hospital? She said she is doing all right.   Any questions or concerns? No  Items Reviewed: Did the pt receive and understand the discharge instructions provided? Yes  Medications obtained and verified? Yes - she said she has all of her medications and did not have any questions about the med regime,  Other? No  Any new allergies since your discharge? No  Dietary orders reviewed? Yes Do you have support at home?  Her niece is staying with her for a week and then she will be on her own.   Home Care and Equipment/Supplies: Were home health services ordered? She was expecting home health services; but a referral was made to outpatient therapy.  She explained that she told the hospital staff that transportation to outpatient therapy was going to be an issue and she preferred home therapy. I will check with the hospital SW.  If so, what is the name of the agency? N/a  Has the agency set up a time to come to the patient's home? not applicable Were any new equipment or medical supplies ordered?  Yes: trach supplies What is the name of the medical supply agency? She received them from the hospital  Were you able to get the supplies/equipment? Yes - she said she was only given a few supplies. I instructed her to call the pulmonology office/ trach clinic for additional supplies.  Do you have any questions related to the use of the equipment or supplies? No  Functional Questionnaire: (I = Independent and D = Dependent) ADLs: independent.  She has a walker but is trying to get around independently.  She reports being independent with trach care. She uses O2 only at night.   Follow up appointments reviewed:  PCP Hospital f/u appt confirmed? Yes  Scheduled to see [name], NP - 02/24/2022.   [facility] f/u appt confirmed?  Yes  Scheduled to see pulmonary -  on 02/10/2022.  Are transportation arrangements needed?  Sometimes. She said her niece can take her to appointments on Mondays. I explained to her that her insurance will provide rides to medical appointments. She said she was aware of that but the rides are not reliable.  If their condition worsens, is the pt aware to call PCP or go to the Emergency Dept.? Yes Was the patient provided with contact information for the PCP's office or ED? Yes Was to pt encouraged to call back with questions or concerns? Yes

## 2022-01-21 ENCOUNTER — Telehealth: Payer: Self-pay

## 2022-01-21 NOTE — Telephone Encounter (Signed)
Call placed to [facility] regarding referral. I spoke to [facility] who said that the referral was a non admit because her insurance is out of network  I sent a secure chat message to Erlene Quan, SW inquiring if any other agencies were contacted about the referral.

## 2022-01-22 NOTE — Progress Notes (Signed)
Inpatient Rehabilitation Care Coordinator Assessment and Plan Patient Details  Name: Angel Brewer MRN: [id number] Date of Birth: [birth year]  Today's Date: 01/22/2022  Hospital Problems: Principal Problem:   Respiratory failure [facility])  Past Medical History:  Past Medical History:  Diagnosis Date   DVT (deep vein thrombosis) in pregnancy    RLE DVT 02/2016   Dyspnea    Morbid obesity ([place])    PE (pulmonary embolism) 02/29/2016   Sleep apnea    uses a cpap   Past Surgical History:  Past Surgical History:  Procedure Laterality Date   CESAREAN SECTION     x 3   OPEN REDUCTION INTERNAL FIXATION (ORIF) DISTAL RADIAL FRACTURE Right 06/20/2019   Procedure: OPEN REDUCTION INTERNAL FIXATION (ORIF) DISTAL RADIAL FRACTURE;  Surgeon: [name], MD;  Location: [place];  Service: Orthopedics;  Laterality: Right;  REGIONAL BLOCK TOTAL SURGERY TIME: 90 MINUTES   SYNOVECTOMY Right 11/21/2019   Procedure: RIGHT WRIST LIGAMENT RECONSTRUCTION;  Surgeon: [name], MD;  Location: [place];  Service: Orthopedics;  Laterality: Right;  PROCEDURE: RIGHT WRIST LIGAMENT RECONSTRUCTION LENGTH OF SURGERY: 105 MIN   Social History:  reports that she has quit smoking. She has never used smokeless tobacco. She reports that she does not drink alcohol and does not use drugs.  Family / Support Systems Marital Status: Single Children: Tyrell (son) Other Supports: Pensions consultant (Niece), [place] (Sister Anticipated Caregiver: patient reports son and niece able able to assist at d/c Ability/Limitations of Caregiver: PRN Caregiver Availability: 24/7 Family Dynamics: support from son, niece and sister  Social History Preferred language: English Religion: [employer] - How often do you need to have someone help you when you read instructions, pamphlets, or other written material from your doctor or pharmacy?: Never Writes: Yes Employment Status: Employed Public relations account executive Issues:  n/a Guardian/Conservator: n/a   Abuse/Neglect Abuse/Neglect Assessment Can Be Completed: Yes Physical Abuse: Denies Verbal Abuse: Denies Sexual Abuse: Denies Exploitation of patient/patient's resources: Denies Self-Neglect: Denies  Patient response to: Social Isolation - How often do you feel lonely or isolated from those around you?: Never  Emotional Status Recent Psychosocial Issues: coping Psychiatric History: n/a Substance Abuse History: n/a  Patient / Family Perceptions, Expectations & Goals Pt/Family understanding of illness & functional limitations: yes Premorbid pt/family roles/activities: Independent and working Anticipated changes in roles/activities/participation: son and niece able to assist PRN with roles and taks Pt/family expectations/goals: MOD I  Community Resources Premorbid Home Care/DME Agencies: Other (Comment) Librarian, academic, Engineer, civil (consulting), Civil engineer, contracting) Transportation available at discharge: family able to transport Is the patient able to respond to transportation needs?: Yes In the past 12 months, has lack of transportation kept you from medical appointments or from getting medications?: No In the past 12 months, has lack of transportation kept you from meetings, work, or from getting things needed for daily living?: No  Discharge Planning Living Arrangements: Alone Support Systems: Children, Other relatives Type of Residence: Private residence Insurance Resources: Medicaid (specify county) Financial Screen Referred: No Living Expenses: Education officer, community Management: Patient Does the patient have any problems obtaining your medications?: No Home Management: Independent Patient/Family Preliminary Plans: Patient anticipates independence Care Coordinator Barriers to Discharge: Insurance for SNF coverage, Lack of/limited family support, Decreased caregiver support, Neurogenic Bowel & Bladder DC Planning Additional Notes/Comments: Trach, size 6 Expected  length of stay: 5-7 Days  Clinical Impression Patient assessed 6/29   Angel Brewer 01/22/2022, 8:58 AM

## 2022-01-23 ENCOUNTER — Other Ambulatory Visit (HOSPITAL_BASED_OUTPATIENT_CLINIC_OR_DEPARTMENT_OTHER): Payer: Self-pay

## 2022-01-29 ENCOUNTER — Ambulatory Visit: Payer: Medicaid Other

## 2022-01-29 ENCOUNTER — Encounter: Payer: Medicaid Other | Admitting: Occupational Therapy

## 2022-02-05 DIAGNOSIS — E662 Morbid (severe) obesity with alveolar hypoventilation: Secondary | ICD-10-CM | POA: Diagnosis not present

## 2022-02-05 DIAGNOSIS — I270 Primary pulmonary hypertension: Secondary | ICD-10-CM | POA: Diagnosis not present

## 2022-02-10 ENCOUNTER — Encounter: Payer: Self-pay | Admitting: Pulmonary Disease

## 2022-02-10 ENCOUNTER — Ambulatory Visit: Payer: Medicaid Other | Admitting: Pulmonary Disease

## 2022-02-10 VITALS — BP 132/74 | HR 82 | Temp 98.5°F | Ht 67.0 in | Wt 399.6 lb

## 2022-02-10 DIAGNOSIS — G4733 Obstructive sleep apnea (adult) (pediatric): Secondary | ICD-10-CM

## 2022-02-10 DIAGNOSIS — E662 Morbid (severe) obesity with alveolar hypoventilation: Secondary | ICD-10-CM | POA: Diagnosis not present

## 2022-02-10 DIAGNOSIS — Z930 Tracheostomy status: Secondary | ICD-10-CM | POA: Diagnosis not present

## 2022-02-10 NOTE — Progress Notes (Signed)
_0  ID: Angel Brewer, female    DOB: 03-26-1966, 56 y.o.   MRN: [id number]  Chief Complaint  Patient presents with   Consult    Doing well    Referring provider: [name], NP  HPI:   56 y.o. woman whom we are seeing in hospital follow-up following prolonged respiratory illness and ventilator dependence status post tracheostomy subsequent weaned from the ventilator no nocturnal oxygen via tracheostomy with past medical history of OHS/OSA.  Recent pulmonary note reviewed.  Discharge summary reviewed from acute care hospital.  Discharge summary reviewed from rehabilitation hospital.  Overall, she is doing well.  Prolonged hospitalization for respiratory failure.  Admitted with acute on chronic hypoxemic/hypercarbic respiratory failure.  Felt to be a OSA/OHS chronically.  Treated with antibiotics.  Difficulty ventilating weaning from ventilator.  Underwent tracheostomy.  Weaned off ventilator transferred to the floor.  Was tolerating Passy-Muir valve.  Continues to tolerate Passy-Muir valve well.  Using nocturnal oxygen at night.  She takes out the Passy-Muir valve and attaches oxygen at night.  [name] is uncapped at night.  Compared to before her time in the hospital recently, she gets much better rest.  Much more active during the day.  Not as sleepy.  No headaches.  Feels better overall.  Slowly get her stamina back.  Spent 7 days in the rehabilitation hospital.  Was doing so well she was discharged.  She continues to work on getting her stamina back, walking etc.    Questionaires / Pulmonary Flowsheets:   ACT:      No data to display          MMRC:     No data to display          Epworth:      No data to display          Tests:   FENO:  No results found for: "NITRICOXIDE"  PFT:     No data to display          WALK:      No data to display          Imaging: Personally reviewed No results found.  Lab Results: Personally  reviewed CBC    Component Value Date/Time   WBC 3.9 (L) 01/13/2022 0637   RBC 4.22 01/13/2022 0637   HGB 11.9 (L) 01/13/2022 0637   HGB 12.2 09/07/2020 0900   HCT 39.2 01/13/2022 0637   HCT 38.7 09/07/2020 0900   PLT 261 01/13/2022 0637   PLT 278 09/07/2020 0900   MCV 92.9 01/13/2022 0637   MCV 91 09/07/2020 0900   MCH 28.2 01/13/2022 0637   MCHC 30.4 01/13/2022 0637   RDW 14.8 01/13/2022 0637   RDW 12.7 09/07/2020 0900   LYMPHSABS 1.3 01/09/2022 0533   LYMPHSABS 1.6 09/07/2020 0900   MONOABS 0.4 01/09/2022 0533   EOSABS 0.3 01/09/2022 0533   EOSABS 0.2 09/07/2020 0900   BASOSABS 0.1 01/09/2022 0533   BASOSABS 0.1 09/07/2020 0900    BMET    Component Value Date/Time   NA 140 01/15/2022 0528   NA 142 09/07/2020 0900   K 3.5 01/15/2022 0528   CL 103 01/15/2022 0528   CO2 27 01/15/2022 0528   GLUCOSE 94 01/15/2022 0528   BUN 12 01/15/2022 0528   BUN 15 09/07/2020 0900   CREATININE 1.02 (H) 01/15/2022 0528   CREATININE 0.81 11/14/2019 1022   CREATININE 0.96 09/09/2016 0911   CALCIUM 9.1 01/15/2022 0528   GFRNONAA >60  01/15/2022 0528   GFRNONAA >60 11/14/2019 1022   GFRNONAA 69 09/09/2016 0911   GFRAA 68 09/07/2020 0900   GFRAA >60 11/14/2019 1022   GFRAA 79 09/09/2016 0911    BNP    Component Value Date/Time   BNP 139.2 (H) 11/21/2021 1827    ProBNP No results found for: "PROBNP"  Specialty Problems       Pulmonary Problems   Chronic respiratory failure (HCC)   OSA (obstructive sleep apnea)   Obesity hypoventilation syndrome (HCC)   COPD (chronic obstructive pulmonary disease) ([place])   Tracheostomy present ([place])   Respiratory failure ([place])    No Known Allergies  Immunization History  Administered Date(s) Administered   Influenza,inj,Quad PF,6+ Mos 03/19/2016   Tdap 03/19/2016    Past Medical History:  Diagnosis Date   DVT (deep vein thrombosis) in pregnancy    RLE DVT 02/2016   Dyspnea    Morbid obesity ([place])    PE (pulmonary embolism)  02/29/2016   Sleep apnea    uses a cpap    Tobacco History: Social History   Tobacco Use  Smoking Status Former  Smokeless Tobacco Never  Tobacco Comments   smoked only as a teenager   Counseling given: Not Answered Tobacco comments: smoked only as a teenager   Continue to not smoke  Outpatient Encounter Medications as of 02/10/2022  Medication Sig   apixaban (ELIQUIS) 5 MG TABS tablet Take 1 tablet (5 mg total) by mouth 2 (two) times daily.   atorvastatin (LIPITOR) 20 MG tablet Take 1 tablet (20 mg total) by mouth daily.   dapagliflozin propanediol (FARXIGA) 10 MG TABS tablet Take 1 tablet (10 mg total) by mouth daily.   diclofenac Sodium (VOLTAREN) 1 % GEL Apply 4 g topically 4 (four) times daily.   furosemide (LASIX) 20 MG tablet Take 1 tablet (20 mg total) by mouth daily.   Multiple Vitamin (MULTIVITAMIN WITH MINERALS) TABS tablet Take 1 tablet by mouth daily.   PRESCRIPTION MEDICATION 1 each by Other route at bedtime. CPAP- At bedtime   spironolactone (ALDACTONE) 25 MG tablet Take 1 tablet (25 mg total) by mouth daily.   Vitamin D, Ergocalciferol, (DRISDOL) 1.25 MG (50000 UNIT) CAPS capsule Take 1 capsule (50,000 Units total) by mouth every 7 (seven) days.   No facility-administered encounter medications on file as of 02/10/2022.     Review of Systems  Review of Systems  No chest pain with exertion.  No orthopnea or PND.  No lower extremity swelling.  Comprehensive review of systems otherwise negative. Physical Exam  BP 132/74 (BP Location: Left Arm, Patient Position: Sitting, Cuff Size: Normal)   Pulse 82   Temp 98.5 F (36.9 C) (Oral)   Ht _0  (1.702 m)   Wt (!) 399 lb 9.6 oz (181.3 kg)   LMP 11/29/2015   SpO2 95%   BMI 62.59 kg/m   Wt Readings from Last 5 Encounters:  02/10/22 (!) 399 lb 9.6 oz (181.3 kg)  01/16/22 (!) 396 lb 6.2 oz (179.8 kg)  01/01/22 (!) 397 lb 0.8 oz (180.1 kg)  05/07/21 (!) 401 lb 0.3 oz (181.9 kg)  12/27/20 (!) 415 lb (188.2 kg)     BMI Readings from Last 5 Encounters:  02/10/22 62.59 kg/m  01/16/22 62.08 kg/m  01/01/22 62.19 kg/m  05/07/21 60.97 kg/m  12/27/20 63.10 kg/m     Physical Exam General: Sitting in chair, no acute distress Eyes: EOMI, icterus Neck: Supple, habitus and trach preclude estimate of JVP Pulmonary:  Distant, clear Cardiovascular: Warm, minimal edema  neuro: Normal gait, no focal weakness Psych: Normal mood, full affect  Assessment & Plan:   OSA, severe clinically with nocturnal hypoxemia: Trilogy NIPPV in the past. Now with Trach. It seems despite aggressive NIPPV with Trilogy she still had severe lethargy, CO2 retention leading to recurrent hospitalizations, most recently 2 month long save. Currently OSA bypassed with trach. Continue O2 at night via trach.  Discussed at length that while the tracheostomy is unnatural and sometimes certainly inconvenient, it seems to be controlling sleep apnea symptoms much more effectively than noninvasive positive pressure ventilation.  As such I would continue to recommend tracheostomy in place for severe OSA refractory to NIPPV.  We will refer to tracheostomy clinic with [name] NP for ongoing trach needs.  Nocturnal hypoxemia: Related to OSA/OHS.  Continue nocturnal oxygen.   No follow-ups on file.   Lanier Clam, MD 02/10/2022   This appointment required 45 minutes of patient care (this includes precharting, chart review, review of results, face-to-face care, etc.).

## 2022-02-16 DIAGNOSIS — J9690 Respiratory failure, unspecified, unspecified whether with hypoxia or hypercapnia: Secondary | ICD-10-CM | POA: Diagnosis not present

## 2022-02-24 ENCOUNTER — Ambulatory Visit: Payer: Medicaid Other | Attending: Nurse Practitioner | Admitting: Nurse Practitioner

## 2022-02-24 ENCOUNTER — Encounter: Payer: Self-pay | Admitting: Nurse Practitioner

## 2022-02-24 VITALS — BP 117/81 | HR 69 | Temp 98.1°F | Ht 68.4 in | Wt >= 6400 oz

## 2022-02-24 DIAGNOSIS — I5032 Chronic diastolic (congestive) heart failure: Secondary | ICD-10-CM

## 2022-02-24 DIAGNOSIS — D649 Anemia, unspecified: Secondary | ICD-10-CM

## 2022-02-24 DIAGNOSIS — Z1211 Encounter for screening for malignant neoplasm of colon: Secondary | ICD-10-CM

## 2022-02-24 DIAGNOSIS — E662 Morbid (severe) obesity with alveolar hypoventilation: Secondary | ICD-10-CM

## 2022-02-24 DIAGNOSIS — Z09 Encounter for follow-up examination after completed treatment for conditions other than malignant neoplasm: Secondary | ICD-10-CM | POA: Diagnosis not present

## 2022-02-24 DIAGNOSIS — E7800 Pure hypercholesterolemia, unspecified: Secondary | ICD-10-CM

## 2022-02-24 DIAGNOSIS — Z1231 Encounter for screening mammogram for malignant neoplasm of breast: Secondary | ICD-10-CM | POA: Diagnosis not present

## 2022-02-24 DIAGNOSIS — Z930 Tracheostomy status: Secondary | ICD-10-CM

## 2022-02-24 MED ORDER — SPIRONOLACTONE 25 MG PO TABS: 25.0000 mg | ORAL_TABLET | Freq: Every day | ORAL | 1 refills | Status: DC

## 2022-02-24 MED ORDER — DAPAGLIFLOZIN PROPANEDIOL 10 MG PO TABS: 10.0000 mg | ORAL_TABLET | Freq: Every day | ORAL | 1 refills | Status: DC

## 2022-02-24 MED ORDER — FUROSEMIDE 20 MG PO TABS: 20.0000 mg | ORAL_TABLET | Freq: Every day | ORAL | 1 refills | Status: DC

## 2022-02-24 MED ORDER — VENTOLIN HFA 108 (90 BASE) MCG/ACT IN AERS: 1.0000 | INHALATION_SPRAY | Freq: Four times a day (QID) | RESPIRATORY_TRACT | 1 refills | Status: DC | PRN

## 2022-02-24 MED ORDER — ACETAMINOPHEN 160 MG/5ML PO LIQD: 600.0000 mg | ORAL | 0 refills | Status: DC | PRN

## 2022-02-24 MED ORDER — ATORVASTATIN CALCIUM 20 MG PO TABS: 20.0000 mg | ORAL_TABLET | Freq: Every day | ORAL | 1 refills | Status: DC

## 2022-02-24 NOTE — Progress Notes (Unsigned)
Pt would like to discuss weight loss medication and smaller tylenol pills.  Parking Placard

## 2022-02-24 NOTE — Progress Notes (Unsigned)
Assessment & Plan:  Angel Brewer was seen today for hospitalization follow-up.  Diagnoses and all orders for this visit:  Hospital discharge follow-up  Colon cancer screening -     Ambulatory referral to Gastroenterology  Obesity hypoventilation syndrome (HCC) -     albuterol (VENTOLIN HFA) 108 (90 Base) MCG/ACT inhaler; Inhale 1-2 puffs into the lungs every 6 (six) hours as needed for wheezing or shortness of breath.  Breast cancer screening by mammogram -     MM DIAG BREAST TOMO BILATERAL; Future  Tracheostomy present (HCC) -     acetaminophen (TYLENOL) 160 MG/5ML liquid; Take 18.8 mLs (600 mg total) by mouth every 4 (four) hours as needed for fever.  Anemia, unspecified type -     CBC with Differential  Chronic diastolic CHF (congestive heart failure) (HCC) -     furosemide (LASIX) 20 MG tablet; Take 1 tablet (20 mg total) by mouth daily. -     dapagliflozin propanediol (FARXIGA) 10 MG TABS tablet; Take 1 tablet (10 mg total) by mouth daily. -     spironolactone (ALDACTONE) 25 MG tablet; Take 1 tablet (25 mg total) by mouth daily.  Hypercholesterolemia -     atorvastatin (LIPITOR) 20 MG tablet; Take 1 tablet (20 mg total) by mouth daily.    Patient has been counseled on age-appropriate routine health concerns for screening and prevention. These are reviewed and up-to-date. Referrals have been placed accordingly. Immunizations are up-to-date or declined.    Subjective:   Chief Complaint  Patient presents with   Hospitalization Follow-up   HPI Angel Brewer 56 y.o. female presents to office today for HFU  She has a history of recurrent DVT/PE, obstructive sleep apnea, obesity hypoventilation syndrome (tracheostomy), HTN, HPL, Chronic diastolic CHF,   HFU Admitted 11-21-2021 through 01-08-2022 and then discharged to [facility] until 01-16-2022. Initially with complaints of worsening dyspnea, orthopnea and cough. She was treated for acute metabolic encephalopathy, acute  hypoxemic respiratory failure and acute on chronic diastolic heart failure. Required intubation, IV diuresis and underwent tracheostomy on 11-29-2021. Currently with trach collar in place. PER PCCM: Not likely to decannulate given her use of trilogy at home, body habitus and history of profound respiratory failure.  Today she states she is doing much better. I did recommend bariatric referral surgery to her today however she declined. She is being followed by Pulmonology. She is tolerating her Passy-muir valve well. Using nocturnal O2 to trach at night.      Review of Systems  Constitutional:  Negative for fever, malaise/fatigue and weight loss.  HENT: Negative.  Negative for nosebleeds.   Eyes: Negative.  Negative for blurred vision, double vision and photophobia.  Respiratory: Negative.  Negative for cough and shortness of breath.   Cardiovascular: Negative.  Negative for chest pain, palpitations and leg swelling.  Gastrointestinal: Negative.  Negative for heartburn, nausea and vomiting.  Musculoskeletal: Negative.  Negative for myalgias.  Neurological: Negative.  Negative for dizziness, focal weakness, seizures and headaches.  Psychiatric/Behavioral: Negative.  Negative for suicidal ideas.     Past Medical History:  Diagnosis Date   DVT (deep vein thrombosis) in pregnancy    RLE DVT 02/2016   Dyspnea    Morbid obesity (HCC)    PE (pulmonary embolism) 02/29/2016   Sleep apnea    uses a cpap    Past Surgical History:  Procedure Laterality Date   CESAREAN SECTION     x 3   OPEN REDUCTION INTERNAL FIXATION (ORIF) DISTAL RADIAL FRACTURE  Right 06/20/2019   Procedure: OPEN REDUCTION INTERNAL FIXATION (ORIF) DISTAL RADIAL FRACTURE;  Surgeon: [name], MD;  Location: [facility];  Service: Orthopedics;  Laterality: Right;  REGIONAL BLOCK TOTAL SURGERY TIME: 90 MINUTES   SYNOVECTOMY Right 11/21/2019   Procedure: RIGHT WRIST LIGAMENT RECONSTRUCTION;  Surgeon: [name], MD;  Location:  [facility];  Service: Orthopedics;  Laterality: Right;  PROCEDURE: RIGHT WRIST LIGAMENT RECONSTRUCTION LENGTH OF SURGERY: 105 MIN    Family History  Problem Relation Age of Onset   Hypertension Sister     Social History Reviewed with no changes to be made today.   Outpatient Medications Prior to Visit  Medication Sig Dispense Refill   apixaban (ELIQUIS) 5 MG TABS tablet Take 1 tablet (5 mg total) by mouth 2 (two) times daily. 60 tablet 0   PRESCRIPTION MEDICATION 1 each by Other route at bedtime. CPAP- At bedtime     atorvastatin (LIPITOR) 20 MG tablet Take 1 tablet (20 mg total) by mouth daily. 90 tablet 0   dapagliflozin propanediol (FARXIGA) 10 MG TABS tablet Take 1 tablet (10 mg total) by mouth daily. 30 tablet 0   furosemide (LASIX) 20 MG tablet Take 1 tablet (20 mg total) by mouth daily. 30 tablet 0   spironolactone (ALDACTONE) 25 MG tablet Take 1 tablet (25 mg total) by mouth daily. 30 tablet 0   diclofenac Sodium (VOLTAREN) 1 % GEL Apply 4 g topically 4 (four) times daily. (Patient not taking: Reported on 02/24/2022) 150 g 0   Multiple Vitamin (MULTIVITAMIN WITH MINERALS) TABS tablet Take 1 tablet by mouth daily. 30 tablet 1   Vitamin D, Ergocalciferol, (DRISDOL) 1.25 MG (50000 UNIT) CAPS capsule Take 1 capsule (50,000 Units total) by mouth every 7 (seven) days. (Patient not taking: Reported on 02/24/2022) 5 capsule 0   No facility-administered medications prior to visit.    No Known Allergies     Objective:    BP 117/81   Pulse 69   Temp 98.1 F (36.7 C) (Oral)   Ht 5' 8.4" (1.737 m)   Wt (!) 404 lb 9.6 oz (183.5 kg)   LMP 11/29/2015   SpO2 95%   BMI 60.80 kg/m  Wt Readings from Last 3 Encounters:  02/24/22 (!) 404 lb 9.6 oz (183.5 kg)  02/10/22 (!) 399 lb 9.6 oz (181.3 kg)  01/16/22 (!) 396 lb 6.2 oz (179.8 kg)    Physical Exam Vitals and nursing note reviewed.  Constitutional:      Appearance: She is well-developed. She is obese.  HENT:     Head: Normocephalic  and atraumatic.  Neck:     Trachea: Tracheostomy present.  Cardiovascular:     Rate and Rhythm: Normal rate and regular rhythm.     Heart sounds: Normal heart sounds. No murmur heard.    No friction rub. No gallop.  Pulmonary:     Effort: Pulmonary effort is normal. No tachypnea or respiratory distress.     Breath sounds: Normal breath sounds. No decreased breath sounds, wheezing, rhonchi or rales.  Chest:     Chest wall: No tenderness.  Abdominal:     General: Bowel sounds are normal.     Palpations: Abdomen is soft.  Musculoskeletal:        General: Normal range of motion.     Cervical back: Normal range of motion.  Skin:    General: Skin is warm and dry.  Neurological:     Mental Status: She is alert and oriented to person, place,  and time.     Coordination: Coordination normal.  Psychiatric:        Behavior: Behavior normal. Behavior is cooperative.        Thought Content: Thought content normal.        Judgment: Judgment normal.          Patient has been counseled extensively about nutrition and exercise as well as the importance of adherence with medications and regular follow-up. The patient was given clear instructions to go to ER or return to medical center if symptoms don't improve, worsen or new problems develop. The patient verbalized understanding.   Follow-up: Return in about 3 months (around 05/27/2022).   [name], FNP-BC [facility] [place], [place]   02/26/2022, 9:44 PM

## 2022-02-25 LAB — CBC WITH DIFFERENTIAL/PLATELET
Basophils Absolute: 0.1 10*3/uL (ref 0.0–0.2)
Basos: 1 %
EOS (ABSOLUTE): 0.3 10*3/uL (ref 0.0–0.4)
Eos: 4 %
Hematocrit: 37.1 % (ref 34.0–46.6)
Hemoglobin: 11.7 g/dL (ref 11.1–15.9)
Immature Grans (Abs): 0 10*3/uL (ref 0.0–0.1)
Immature Granulocytes: 1 %
Lymphocytes Absolute: 1.9 10*3/uL (ref 0.7–3.1)
Lymphs: 29 %
MCH: 28.1 pg (ref 26.6–33.0)
MCHC: 31.5 g/dL (ref 31.5–35.7)
MCV: 89 fL (ref 79–97)
Monocytes Absolute: 0.5 10*3/uL (ref 0.1–0.9)
Monocytes: 7 %
Neutrophils Absolute: 3.8 10*3/uL (ref 1.4–7.0)
Neutrophils: 58 %
Platelets: 214 10*3/uL (ref 150–450)
RBC: 4.17 x10E6/uL (ref 3.77–5.28)
RDW: 13.7 % (ref 11.7–15.4)
WBC: 6.6 10*3/uL (ref 3.4–10.8)

## 2022-02-26 ENCOUNTER — Encounter: Payer: Self-pay | Admitting: Nurse Practitioner

## 2022-03-02 ENCOUNTER — Other Ambulatory Visit: Payer: Self-pay | Admitting: Family Medicine

## 2022-03-02 DIAGNOSIS — I10 Essential (primary) hypertension: Secondary | ICD-10-CM

## 2022-03-11 ENCOUNTER — Encounter: Payer: Medicaid Other | Admitting: Physical Medicine and Rehabilitation

## 2022-03-12 ENCOUNTER — Other Ambulatory Visit: Payer: Self-pay | Admitting: Nurse Practitioner

## 2022-03-12 DIAGNOSIS — Z7901 Long term (current) use of anticoagulants: Secondary | ICD-10-CM

## 2022-03-12 DIAGNOSIS — I2699 Other pulmonary embolism without acute cor pulmonale: Secondary | ICD-10-CM

## 2022-03-12 MED ORDER — APIXABAN 5 MG PO TABS: 5.0000 mg | ORAL_TABLET | Freq: Two times a day (BID) | ORAL | 3 refills | Status: DC

## 2022-03-12 NOTE — Telephone Encounter (Signed)
Medication Refill - Medication: apixaban (ELIQUIS) 5 MG TABS tablet  Has the patient contacted their pharmacy? Yes.     Preferred Pharmacy (with phone number or street name):   [id number] [place] [place]:  [phone] Fax:  [phone]    Has the patient been seen for an appointment in the last year OR does the patient have an upcoming appointment? Yes.    Agent: Please be advised that RX refills may take up to 3 business days. We ask that you follow-up with your pharmacy.

## 2022-03-12 NOTE — Telephone Encounter (Signed)
Requested Prescriptions  Pending Prescriptions Disp Refills  . apixaban (ELIQUIS) 5 MG TABS tablet 180 tablet 3    Sig: Take 1 tablet (5 mg total) by mouth 2 (two) times daily.     Hematology:  Anticoagulants - apixaban Failed - 03/12/2022  2:54 PM      Failed - Cr in normal range and within 360 days    Creatinine  Date Value Ref Range Status  11/14/2019 0.81 0.44 - 1.00 mg/dL Final   Creat  Date Value Ref Range Status  09/09/2016 0.96 0.50 - 1.05 mg/dL Final    Comment:      For patients > or = 57 years of age: The upper reference limit for Creatinine is approximately 13% higher for people identified as African-American.      Creatinine, Ser  Date Value Ref Range Status  01/15/2022 1.02 (H) 0.44 - 1.00 mg/dL Final   Creatinine, POC  Date Value Ref Range Status  02/16/2017 300 mg/dL Final   Creatinine, Urine  Date Value Ref Range Status  11/23/2019 169.75 mg/dL Final    Comment:    Performed at [facility], [place] [address], [place], [place] [address]         Failed - AST in normal range and within 360 days    AST  Date Value Ref Range Status  01/09/2022 14 (L) 15 - 41 U/L Final  11/14/2019 13 (L) 15 - 41 U/L Final         Passed - PLT in normal range and within 360 days    Platelets  Date Value Ref Range Status  02/24/2022 214 150 - 450 x10E3/uL Final         Passed - HGB in normal range and within 360 days    Hemoglobin  Date Value Ref Range Status  02/24/2022 11.7 11.1 - 15.9 g/dL Final         Passed - HCT in normal range and within 360 days    Hematocrit  Date Value Ref Range Status  02/24/2022 37.1 34.0 - 46.6 % Final         Passed - ALT in normal range and within 360 days    ALT  Date Value Ref Range Status  01/09/2022 12 0 - 44 U/L Final  11/14/2019 10 0 - 44 U/L Final         Passed - Valid encounter within last 12 months    Recent Outpatient Visits          2 weeks ago Hospital discharge follow-up   [place], Zelda W, NP   1 year ago Recurrent pulmonary emboli [facility])   [place], Zelda W, NP   1 year ago Hospital discharge follow-up   [place], [place] W, NP   2 years ago Chronic deep vein thrombosis (DVT) of femoral vein of left lower extremity [facility])   [place] Elsie Stain, MD   2 years ago Recurrent acute deep vein thrombosis (DVT) of right lower extremity [facility])   [place], Zelda W, NP      Future Appointments            In 2 months [name], NP [place]

## 2022-03-14 ENCOUNTER — Telehealth: Payer: Self-pay | Admitting: Pulmonary Disease

## 2022-03-14 DIAGNOSIS — E662 Morbid (severe) obesity with alveolar hypoventilation: Secondary | ICD-10-CM

## 2022-03-14 NOTE — Telephone Encounter (Signed)
I have placed the order for this patient per Geraldo Pitter NP.  The patient is aware. Nothing further needed.

## 2022-03-14 NOTE — Telephone Encounter (Signed)
She is using her oxygen at home and she is still feeling that the oxygen is making her mouth too dry. She was told she had a water chamber on her oxygen at the hospital and it made her mouth not feel as dry. This is a Dr. Silas Flood patient and he is off today. Please advise if ok to send in an order for water bottle for her oxygen concentrator?

## 2022-03-14 NOTE — Telephone Encounter (Signed)
Yes we can add humidification to oxygen

## 2022-03-14 NOTE — Telephone Encounter (Addendum)
She thinks it may be [employer] for medical supply company.

## 2022-03-18 ENCOUNTER — Telehealth: Payer: Self-pay | Admitting: Nurse Practitioner

## 2022-03-18 NOTE — Telephone Encounter (Signed)
Pt is interested in referral to bariatric surgery. Please advise CB- [phone]

## 2022-03-19 ENCOUNTER — Other Ambulatory Visit: Payer: Self-pay | Admitting: Nurse Practitioner

## 2022-03-19 DIAGNOSIS — J9690 Respiratory failure, unspecified, unspecified whether with hypoxia or hypercapnia: Secondary | ICD-10-CM | POA: Diagnosis not present

## 2022-03-20 NOTE — Telephone Encounter (Signed)
Patient aware of message for referral.

## 2022-03-24 ENCOUNTER — Ambulatory Visit: Payer: Self-pay

## 2022-03-24 NOTE — Telephone Encounter (Signed)
Please advise.

## 2022-03-24 NOTE — Telephone Encounter (Signed)
Chief Complaint: Recent tracheostomy and uses O2 at night. No humidifier with O2. Does not have phone number for O2 company Symptoms: [name] "gets dry and makes me cough." "I need to see someone about this trach too." Frequency:  Pertinent Negatives: Patient denies fever Disposition: _0 ED /_1 Urgent Care (no appt availability in office) / _2 Appointment(In office/virtual)/ _3  [place] [facility]/ _4 Home Care/ _5 Refused Recommended Disposition /_6 [place] [facility]/ _7  Follow-up with PCP Additional Notes: States "[place]" brought O2 to her home, no phone number. Please advise pt.  Answer Assessment - Initial Assessment Questions 1. ONSET: "When did the cough begin?"      Home from the hospital 2. SEVERITY: "How bad is the cough today?"      Moderate 3. SPUTUM: "Describe the color of your sputum" (none, dry cough; clear, white, yellow, green)     No 4. HEMOPTYSIS: "Are you coughing up any blood?" If so ask: "How much?" (flecks, streaks, tablespoons, etc.)     No 5. DIFFICULTY BREATHING: "Are you having difficulty breathing?" If Yes, ask: "How bad is it?" (e.g., mild, moderate, severe)    - MILD: No SOB at rest, mild SOB with walking, speaks normally in sentences, can lie down, no retractions, pulse < 100.    - MODERATE: SOB at rest, SOB with minimal exertion and prefers to sit, cannot lie down flat, speaks in phrases, mild retractions, audible wheezing, pulse 100-120.    - SEVERE: Very SOB at rest, speaks in single words, struggling to breathe, sitting hunched forward, retractions, pulse > 120      None 6. FEVER: "Do you have a fever?" If Yes, ask: "What is your temperature, how was it measured, and when did it start?"     No 7. CARDIAC HISTORY: "Do you have any history of heart disease?" (e.g., heart attack, congestive heart failure)      No 8. LUNG HISTORY: "Do you have any history of lung disease?"  (e.g., pulmonary embolus, asthma, emphysema)     No 9. PE RISK FACTORS:  "Do you have a history of blood clots?" (or: recent major surgery, recent prolonged travel, bedridden)     No 10. OTHER SYMPTOMS: "Do you have any other symptoms?" (e.g., runny nose, wheezing, chest pain)       No 11. PREGNANCY: "Is there any chance you are pregnant?" "When was your last menstrual period?"       No 12. TRAVEL: "Have you traveled out of the country in the last month?" (e.g., travel history, exposures)       No  Protocols used: Cough - Acute Non-Productive-A-AH

## 2022-03-24 NOTE — Telephone Encounter (Signed)
Routing to [place]

## 2022-03-25 ENCOUNTER — Telehealth: Payer: Self-pay | Admitting: Emergency Medicine

## 2022-03-25 ENCOUNTER — Other Ambulatory Visit: Payer: Self-pay | Admitting: Nurse Practitioner

## 2022-03-25 DIAGNOSIS — Z930 Tracheostomy status: Secondary | ICD-10-CM

## 2022-03-25 MED ORDER — MISC. DEVICES MISC: 0 refills | Status: DC

## 2022-03-25 NOTE — Telephone Encounter (Signed)
Copied from [place] [id number]. Topic: General - Other >> Mar 25, 2022 11:10 AM Angel Brewer wrote: Reason for CRM: The patient has called to follow up on their previous request for contact with their PCP  The patient would like to know when they can expect to speak directly with Prov. Fleming   Please contact the patient further when possible

## 2022-03-25 NOTE — Telephone Encounter (Signed)
Angel Brewer can we verify if she needs humidifier from [place] or [place]. I saw [place] on her referral list and she is saying [employer]

## 2022-03-26 ENCOUNTER — Other Ambulatory Visit: Payer: Self-pay

## 2022-03-26 NOTE — Telephone Encounter (Signed)
Checking for an update on referral.

## 2022-03-27 ENCOUNTER — Other Ambulatory Visit (HOSPITAL_COMMUNITY): Payer: Self-pay

## 2022-03-27 DIAGNOSIS — J9611 Chronic respiratory failure with hypoxia: Secondary | ICD-10-CM | POA: Diagnosis not present

## 2022-03-27 DIAGNOSIS — E662 Morbid (severe) obesity with alveolar hypoventilation: Secondary | ICD-10-CM | POA: Diagnosis not present

## 2022-03-27 DIAGNOSIS — G4733 Obstructive sleep apnea (adult) (pediatric): Secondary | ICD-10-CM | POA: Diagnosis not present

## 2022-04-03 ENCOUNTER — Telehealth: Payer: Self-pay

## 2022-04-03 NOTE — Telephone Encounter (Signed)
I called the patient to confirm she has received the humidification for her O2.  Voicemail was not set up.

## 2022-04-07 ENCOUNTER — Ambulatory Visit
Admission: RE | Admit: 2022-04-07 | Discharge: 2022-04-07 | Disposition: A | Discharge status: Home / Self Care | Payer: Medicaid Other | Source: Ambulatory Visit | Attending: Nurse Practitioner | Admitting: Nurse Practitioner

## 2022-04-07 DIAGNOSIS — Z1231 Encounter for screening mammogram for malignant neoplasm of breast: Secondary | ICD-10-CM | POA: Diagnosis not present

## 2022-04-18 DIAGNOSIS — J9690 Respiratory failure, unspecified, unspecified whether with hypoxia or hypercapnia: Secondary | ICD-10-CM | POA: Diagnosis not present

## 2022-04-21 ENCOUNTER — Ambulatory Visit (HOSPITAL_COMMUNITY)
Admission: RE | Admit: 2022-04-21 | Discharge: 2022-04-21 | Disposition: A | Discharge status: Home / Self Care | Payer: Medicaid Other | Source: Ambulatory Visit | Attending: Acute Care | Admitting: Acute Care

## 2022-04-21 DIAGNOSIS — Z930 Tracheostomy status: Secondary | ICD-10-CM | POA: Diagnosis not present

## 2022-04-21 DIAGNOSIS — Z6841 Body Mass Index (BMI) 40.0 and over, adult: Secondary | ICD-10-CM | POA: Diagnosis present

## 2022-04-21 DIAGNOSIS — E66813 Obesity, class 3: Secondary | ICD-10-CM | POA: Diagnosis present

## 2022-04-21 DIAGNOSIS — E662 Morbid (severe) obesity with alveolar hypoventilation: Secondary | ICD-10-CM | POA: Diagnosis not present

## 2022-04-21 DIAGNOSIS — I5032 Chronic diastolic (congestive) heart failure: Secondary | ICD-10-CM | POA: Diagnosis present

## 2022-04-21 DIAGNOSIS — I5031 Acute diastolic (congestive) heart failure: Secondary | ICD-10-CM | POA: Diagnosis present

## 2022-04-21 DIAGNOSIS — I5033 Acute on chronic diastolic (congestive) heart failure: Secondary | ICD-10-CM | POA: Diagnosis present

## 2022-04-21 DIAGNOSIS — G4733 Obstructive sleep apnea (adult) (pediatric): Secondary | ICD-10-CM | POA: Diagnosis not present

## 2022-04-21 NOTE — Progress Notes (Signed)
Reason for visit.  Trach care   HPI 56 yof known by our service. Last seen in hospital by our service back in June 2023. Her trach was placed May 2023. She was last seen in pulm office 7/31. Since her trach she has not required nocturnal ventilation and her activity tolerance has improved. She was actually supposed to come here after her last hospitalization but somehow that got dropped. She presents today for her first post-hospital trach clinic visit.   Review of Systems  Constitutional: Negative.   HENT: Negative.    Eyes: Negative.   Respiratory:  Positive for shortness of breath.        Sob w exertion   Cardiovascular: Negative.   Gastrointestinal: Negative.   Genitourinary: Negative.   Skin:        Mild amt of drainage from bottom of stoma   Endo/Heme/Allergies: Negative.     Exam  General: 56 year old female ambulatory, walked into clinic.  No acute distress HEENT normocephalic atraumatic she has a size 6 cuffless tracheostomy is midline.  There is mild amount of granulomatous tissue around trach stoma, her phonation quality is excellent Pulmonary: Clear, diminished in the bases, no accessory use Cardiac: Regular rate and rhythm Abdomen not tender Extremities warm dry Neuro intact  Procedure  The existing tracheostomy was removed, site inspected, exam noted above.  A new tracheostomy was inserted over obturator.  Size 6 cuffless trach inserted without difficulty.  Placement verified by end-tidal CO2.  Impression/plan   Principal Problem:   Tracheostomy present [facility]) Active Problems:   Class 3 obesity (HCC)   OSA (obstructive sleep apnea)   Obesity hypoventilation syndrome (HCC)   Chronic diastolic CHF (congestive heart failure) ([place])   Tracheostomy present (HCC) Overview: Size 6 shiley/cuffless Last changed: 10/9  Discussion Stable trach.  She does get a [place] more short of breath with exertion.  Although I did not observe it on my exam she does endorse she  will occasionally have a rush of air after removing PMV following activity.  This could suggest some mild air trapping from the trach.  Otherwise she is doing fantastic with the trach  Plan Cont routine trach care Have instructed her to take the [place] off when she is ambulating ROV 3 mo.  We will place orders for tracheostomy supplies at [place] ACNP-BC [place] Pager # [phone] OR # [phone] if no answer      My time was 23 minutes

## 2022-04-21 NOTE — Progress Notes (Signed)
Tracheostomy Procedure Note  Angel Brewer [id number] 1966/05/06  Pre Procedure Tracheostomy Information  Trach Brand: Shiley Size:  6.0 0CX44Y Style: Uncuffed Secured by: Velcro   Procedure: Trach Change and Trach Cleaning    Post Procedure Tracheostomy Information  Trach Brand: Shiley Size:  6.0 [id number] Style: Uncuffed Secured by: Velcro   Post Procedure Evaluation:  ETCO2 positive color change from yellow to purple : Yes.   Vital signs:VSS Patients current condition: stable Complications: No apparent complications Trach site exam: clean, dry Wound care done: 4 x 4 gauze darin gauze Patient did tolerate procedure well.   Education: none  Prescription needs: none    Additional needs: Nee PMV given at this visit

## 2022-04-22 DIAGNOSIS — Z930 Tracheostomy status: Secondary | ICD-10-CM | POA: Diagnosis not present

## 2022-05-13 DIAGNOSIS — Z6841 Body Mass Index (BMI) 40.0 and over, adult: Secondary | ICD-10-CM | POA: Diagnosis not present

## 2022-05-13 DIAGNOSIS — I5032 Chronic diastolic (congestive) heart failure: Secondary | ICD-10-CM | POA: Diagnosis not present

## 2022-05-13 DIAGNOSIS — Z930 Tracheostomy status: Secondary | ICD-10-CM | POA: Diagnosis not present

## 2022-05-13 DIAGNOSIS — Z86711 Personal history of pulmonary embolism: Secondary | ICD-10-CM | POA: Diagnosis not present

## 2022-05-13 DIAGNOSIS — E7800 Pure hypercholesterolemia, unspecified: Secondary | ICD-10-CM | POA: Diagnosis not present

## 2022-05-15 ENCOUNTER — Other Ambulatory Visit: Payer: Self-pay | Admitting: Family Medicine

## 2022-05-15 DIAGNOSIS — I10 Essential (primary) hypertension: Secondary | ICD-10-CM

## 2022-05-19 DIAGNOSIS — J9690 Respiratory failure, unspecified, unspecified whether with hypoxia or hypercapnia: Secondary | ICD-10-CM | POA: Diagnosis not present

## 2022-05-22 ENCOUNTER — Other Ambulatory Visit: Payer: Self-pay | Admitting: Nurse Practitioner

## 2022-05-22 ENCOUNTER — Other Ambulatory Visit: Payer: Self-pay | Admitting: Family Medicine

## 2022-05-22 DIAGNOSIS — I5032 Chronic diastolic (congestive) heart failure: Secondary | ICD-10-CM

## 2022-05-22 DIAGNOSIS — I10 Essential (primary) hypertension: Secondary | ICD-10-CM

## 2022-05-22 NOTE — Telephone Encounter (Signed)
Unable to refill per protocol, Rx request is too soon, last refill 05/15/22 for 30. Will refuse duplicate request.  Requested Prescriptions  Pending Prescriptions Disp Refills   amLODipine (NORVASC) 5 MG tablet [Pharmacy Med Name: AMLODIPINE BESYLATE 5 MG TAB] 30 tablet 0    Sig: TAKE 1 TABLET (5 MG TOTAL) BY MOUTH DAILY.     Cardiovascular: Calcium Channel Blockers 2 Passed - 05/22/2022  9:32 AM      Passed - Last BP in normal range    BP Readings from Last 1 Encounters:  02/24/22 117/81         Passed - Last Heart Rate in normal range    Pulse Readings from Last 1 Encounters:  02/24/22 69         Passed - Valid encounter within last 6 months    Recent Outpatient Visits           2 months ago Hospital discharge follow-up   [place], Zelda W, NP   1 year ago Recurrent pulmonary emboli [facility])   [place] [name], NP   1 year ago Hospital discharge follow-up   [place], [place] W, NP   2 years ago Chronic deep vein thrombosis (DVT) of femoral vein of left lower extremity [facility])   [place] Elsie Stain, MD   2 years ago Recurrent acute deep vein thrombosis (DVT) of right lower extremity [facility])   [place], Zelda W, NP       Future Appointments             In 1 week [name], NP [place]

## 2022-05-22 NOTE — Telephone Encounter (Signed)
Rx was sent to pharmacy on 02/24/22 #90/1.  Requested Prescriptions  Pending Prescriptions Disp Refills   spironolactone (ALDACTONE) 25 MG tablet 90 tablet 1    Sig: Take 1 tablet (25 mg total) by mouth daily.     Cardiovascular: Diuretics - Aldosterone Antagonist Failed - 05/22/2022 12:11 PM      Failed - Cr in normal range and within 180 days    Creatinine  Date Value Ref Range Status  11/14/2019 0.81 0.44 - 1.00 mg/dL Final   Creat  Date Value Ref Range Status  09/09/2016 0.96 0.50 - 1.05 mg/dL Final    Comment:      For patients > or = 56 years of age: The upper reference limit for Creatinine is approximately 13% higher for people identified as African-American.      Creatinine, Ser  Date Value Ref Range Status  01/15/2022 1.02 (H) 0.44 - 1.00 mg/dL Final   Creatinine, POC  Date Value Ref Range Status  02/16/2017 300 mg/dL Final   Creatinine, Urine  Date Value Ref Range Status  11/23/2019 169.75 mg/dL Final    Comment:    Performed at [facility], [place] [address], [place], [place] [address]         Passed - K in normal range and within 180 days    Potassium  Date Value Ref Range Status  01/15/2022 3.5 3.5 - 5.1 mmol/L Final         Passed - Na in normal range and within 180 days    Sodium  Date Value Ref Range Status  01/15/2022 140 135 - 145 mmol/L Final  09/07/2020 142 134 - 144 mmol/L Final         Passed - eGFR is 30 or above and within 180 days    GFR, Est African American  Date Value Ref Range Status  09/09/2016 79 >=60 mL/min Final   GFR, Est AFR Am  Date Value Ref Range Status  11/14/2019 >60 >60 mL/min Final   GFR calc Af Amer  Date Value Ref Range Status  09/07/2020 68 >59 mL/min/1.73 Final    Comment:    **In accordance with recommendations from the NKF-ASN Task force,**   [facility] is in the process of updating its eGFR calculation to the   2021 CKD-EPI creatinine equation that estimates kidney function   without a race variable.     GFR, Est Non African American  Date Value Ref Range Status  09/09/2016 69 >=60 mL/min Final   GFR, Estimated  Date Value Ref Range Status  01/15/2022 >60 >60 mL/min Final    Comment:    (NOTE) Calculated using the CKD-EPI Creatinine Equation (2021)   11/14/2019 >60 >60 mL/min Final         Passed - Last BP in normal range    BP Readings from Last 1 Encounters:  02/24/22 117/81         Passed - Valid encounter within last 6 months    Recent Outpatient Visits           2 months ago Hospital discharge follow-up   [place] [name], NP   1 year ago Recurrent pulmonary emboli [facility])   [place] [name], NP   1 year ago Hospital discharge follow-up   [place], [place] W, NP   2 years ago Chronic deep vein thrombosis (DVT) of femoral vein of left  lower extremity [facility])   [place] Elsie Stain, MD   2 years ago Recurrent acute deep vein thrombosis (DVT) of right lower extremity [facility])   [place], Zelda W, NP       Future Appointments             In 1 week [name], NP [place]

## 2022-05-22 NOTE — Telephone Encounter (Signed)
Medication Refill - Medication: Spironolactone 25  mg  Has the patient contacted their pharmacy? No. (Agent: If no, request that the patient contact the pharmacy for the refill. If patient does not wish to contact the pharmacy document the reason why and proceed with request.) (Agent: If yes, when and what did the pharmacy advise?)  Preferred Pharmacy (with phone number or street name): [facility] [place] Has the patient been seen for an appointment in the last year OR does the patient have an upcoming appointment? yes  Agent: Please be advised that RX refills may take up to 3 business days. We ask that you follow-up with your pharmacy.

## 2022-06-02 ENCOUNTER — Ambulatory Visit: Payer: Medicaid Other | Admitting: Nurse Practitioner

## 2022-06-07 ENCOUNTER — Other Ambulatory Visit: Payer: Self-pay | Admitting: Family Medicine

## 2022-06-07 DIAGNOSIS — I10 Essential (primary) hypertension: Secondary | ICD-10-CM

## 2022-06-09 NOTE — Telephone Encounter (Signed)
Requested Prescriptions  Pending Prescriptions Disp Refills   amLODipine (NORVASC) 5 MG tablet [Pharmacy Med Name: AMLODIPINE BESYLATE 5 MG TAB] 90 tablet 0    Sig: TAKE 1 TABLET (5 MG TOTAL) BY MOUTH DAILY.     Cardiovascular: Calcium Channel Blockers 2 Passed - 06/07/2022  7:24 AM      Passed - Last BP in normal range    BP Readings from Last 1 Encounters:  02/24/22 117/81         Passed - Last Heart Rate in normal range    Pulse Readings from Last 1 Encounters:  02/24/22 69         Passed - Valid encounter within last 6 months    Recent Outpatient Visits           3 months ago Hospital discharge follow-up   [place], [place]da W, NP   1 year ago Recurrent pulmonary emboli [facility])   [place] [name], NP   1 year ago Hospital discharge follow-up   [place] W, NP   2 years ago Chronic deep vein thrombosis (DVT) of femoral vein of left lower extremity [facility])   [place] Elsie Stain, MD   2 years ago Recurrent acute deep vein thrombosis (DVT) of right lower extremity [facility])   [place] [name], NP       Future Appointments             In 1 month [name], NP [place]

## 2022-06-12 DIAGNOSIS — Z6841 Body Mass Index (BMI) 40.0 and over, adult: Secondary | ICD-10-CM | POA: Diagnosis not present

## 2022-06-12 DIAGNOSIS — E7800 Pure hypercholesterolemia, unspecified: Secondary | ICD-10-CM | POA: Diagnosis not present

## 2022-06-12 DIAGNOSIS — Z930 Tracheostomy status: Secondary | ICD-10-CM | POA: Diagnosis not present

## 2022-06-16 ENCOUNTER — Ambulatory Visit: Payer: Self-pay | Admitting: *Deleted

## 2022-06-16 ENCOUNTER — Emergency Department (HOSPITAL_COMMUNITY): Payer: Medicaid Other

## 2022-06-16 ENCOUNTER — Inpatient Hospital Stay (HOSPITAL_COMMUNITY)
Admission: EM | Admit: 2022-06-16 | Discharge: 2022-06-24 | DRG: 193 | Disposition: A | Discharge status: Home / Self Care | Payer: Medicaid Other | Attending: Family Medicine | Admitting: Family Medicine

## 2022-06-16 DIAGNOSIS — E876 Hypokalemia: Secondary | ICD-10-CM | POA: Diagnosis present

## 2022-06-16 DIAGNOSIS — J9621 Acute and chronic respiratory failure with hypoxia: Secondary | ICD-10-CM | POA: Diagnosis not present

## 2022-06-16 DIAGNOSIS — R0902 Hypoxemia: Secondary | ICD-10-CM | POA: Diagnosis not present

## 2022-06-16 DIAGNOSIS — Z79899 Other long term (current) drug therapy: Secondary | ICD-10-CM | POA: Diagnosis not present

## 2022-06-16 DIAGNOSIS — Z87891 Personal history of nicotine dependence: Secondary | ICD-10-CM

## 2022-06-16 DIAGNOSIS — Z1152 Encounter for screening for COVID-19: Secondary | ICD-10-CM | POA: Diagnosis not present

## 2022-06-16 DIAGNOSIS — Z86711 Personal history of pulmonary embolism: Secondary | ICD-10-CM | POA: Diagnosis not present

## 2022-06-16 DIAGNOSIS — J9622 Acute and chronic respiratory failure with hypercapnia: Secondary | ICD-10-CM | POA: Diagnosis not present

## 2022-06-16 DIAGNOSIS — E662 Morbid (severe) obesity with alveolar hypoventilation: Secondary | ICD-10-CM | POA: Diagnosis not present

## 2022-06-16 DIAGNOSIS — E785 Hyperlipidemia, unspecified: Secondary | ICD-10-CM | POA: Diagnosis not present

## 2022-06-16 DIAGNOSIS — Z8249 Family history of ischemic heart disease and other diseases of the circulatory system: Secondary | ICD-10-CM | POA: Diagnosis not present

## 2022-06-16 DIAGNOSIS — I5031 Acute diastolic (congestive) heart failure: Secondary | ICD-10-CM | POA: Diagnosis present

## 2022-06-16 DIAGNOSIS — G4733 Obstructive sleep apnea (adult) (pediatric): Secondary | ICD-10-CM | POA: Diagnosis present

## 2022-06-16 DIAGNOSIS — Z7901 Long term (current) use of anticoagulants: Secondary | ICD-10-CM | POA: Diagnosis not present

## 2022-06-16 DIAGNOSIS — Z6841 Body Mass Index (BMI) 40.0 and over, adult: Secondary | ICD-10-CM

## 2022-06-16 DIAGNOSIS — I5033 Acute on chronic diastolic (congestive) heart failure: Secondary | ICD-10-CM | POA: Diagnosis not present

## 2022-06-16 DIAGNOSIS — J189 Pneumonia, unspecified organism: Secondary | ICD-10-CM | POA: Diagnosis not present

## 2022-06-16 DIAGNOSIS — I110 Hypertensive heart disease with heart failure: Secondary | ICD-10-CM | POA: Diagnosis present

## 2022-06-16 DIAGNOSIS — Z86718 Personal history of other venous thrombosis and embolism: Secondary | ICD-10-CM | POA: Diagnosis not present

## 2022-06-16 DIAGNOSIS — Z930 Tracheostomy status: Secondary | ICD-10-CM | POA: Diagnosis not present

## 2022-06-16 DIAGNOSIS — E66813 Obesity, class 3: Secondary | ICD-10-CM

## 2022-06-16 DIAGNOSIS — J9601 Acute respiratory failure with hypoxia: Secondary | ICD-10-CM | POA: Diagnosis not present

## 2022-06-16 DIAGNOSIS — J811 Chronic pulmonary edema: Secondary | ICD-10-CM | POA: Diagnosis not present

## 2022-06-16 DIAGNOSIS — R0602 Shortness of breath: Secondary | ICD-10-CM | POA: Diagnosis not present

## 2022-06-16 LAB — CBC WITH DIFFERENTIAL/PLATELET
Abs Immature Granulocytes: 0.03 10*3/uL (ref 0.00–0.07)
Basophils Absolute: 0.1 10*3/uL (ref 0.0–0.1)
Basophils Relative: 1 %
Eosinophils Absolute: 0 10*3/uL (ref 0.0–0.5)
Eosinophils Relative: 0 %
HCT: 39.8 % (ref 36.0–46.0)
Hemoglobin: 12.6 g/dL (ref 12.0–15.0)
Immature Granulocytes: 1 %
Lymphocytes Relative: 29 %
Lymphs Abs: 1.5 10*3/uL (ref 0.7–4.0)
MCH: 28.6 pg (ref 26.0–34.0)
MCHC: 31.7 g/dL (ref 30.0–36.0)
MCV: 90.2 fL (ref 80.0–100.0)
Monocytes Absolute: 0.5 10*3/uL (ref 0.1–1.0)
Monocytes Relative: 9 %
Neutro Abs: 3.1 10*3/uL (ref 1.7–7.7)
Neutrophils Relative %: 60 %
Platelets: 185 10*3/uL (ref 150–400)
RBC: 4.41 MIL/uL (ref 3.87–5.11)
RDW: 14.6 % (ref 11.5–15.5)
WBC: 5.1 10*3/uL (ref 4.0–10.5)
nRBC: 0.8 % — ABNORMAL HIGH (ref 0.0–0.2)

## 2022-06-16 LAB — TROPONIN I (HIGH SENSITIVITY)
Troponin I (High Sensitivity): 13 ng/L (ref <18)
Troponin I (High Sensitivity): 12 ng/L (ref <18)

## 2022-06-16 LAB — BASIC METABOLIC PANEL
Anion gap: 9 (ref 5–15)
BUN: 21 mg/dL — ABNORMAL HIGH (ref 6–20)
CO2: 25 mmol/L (ref 22–32)
Calcium: 8.7 mg/dL — ABNORMAL LOW (ref 8.9–10.3)
Chloride: 103 mmol/L (ref 98–111)
Creatinine, Ser: 1.48 mg/dL — ABNORMAL HIGH (ref 0.44–1.00)
GFR, Estimated: 41 mL/min — ABNORMAL LOW (ref >60)
Glucose, Bld: 110 mg/dL — ABNORMAL HIGH (ref 70–99)
Potassium: 3.4 mmol/L — ABNORMAL LOW (ref 3.5–5.1)
Sodium: 137 mmol/L (ref 135–145)

## 2022-06-16 LAB — I-STAT VENOUS BLOOD GAS, ED
Acid-Base Excess: 2 mmol/L (ref 0.0–2.0)
Bicarbonate: 30.3 mmol/L — ABNORMAL HIGH (ref 20.0–28.0)
Calcium, Ion: 1.15 mmol/L (ref 1.15–1.40)
HCT: 39 % (ref 36.0–46.0)
Hemoglobin: 13.3 g/dL (ref 12.0–15.0)
O2 Saturation: 87 %
Potassium: 3.9 mmol/L (ref 3.5–5.1)
Sodium: 140 mmol/L (ref 135–145)
TCO2: 32 mmol/L (ref 22–32)
pCO2, Ven: 61.9 mmHg — ABNORMAL HIGH (ref 44–60)
pH, Ven: 7.298 (ref 7.25–7.43)
pO2, Ven: 60 mmHg — ABNORMAL HIGH (ref 32–45)

## 2022-06-16 LAB — BRAIN NATRIURETIC PEPTIDE: B Natriuretic Peptide: 25 pg/mL (ref 0.0–100.0)

## 2022-06-16 LAB — RESP PANEL BY RT-PCR (FLU A&B, COVID) ARPGX2
Influenza A by PCR: NEGATIVE
Influenza B by PCR: NEGATIVE
SARS Coronavirus 2 by RT PCR: NEGATIVE

## 2022-06-16 MED ORDER — SODIUM CHLORIDE 0.9 % IV SOLN
500.0000 mg | Freq: Once | INTRAVENOUS | Status: AC
  Administered 2022-06-16: 500 mg via INTRAVENOUS
  Filled 2022-06-16: qty 5

## 2022-06-16 MED ORDER — SODIUM CHLORIDE 0.9 % IV SOLN
2.0000 g | Freq: Once | INTRAVENOUS | Status: AC
  Administered 2022-06-16: 2 g via INTRAVENOUS
  Filled 2022-06-16: qty 20

## 2022-06-16 NOTE — ED Triage Notes (Signed)
Pt to ED c/o [facility] over the past couple days. Also c/o cough. Trach pt, reports trach was initially placed back in May, changed in October.

## 2022-06-16 NOTE — ED Provider Notes (Signed)
[place] EMERGENCY DEPARTMENT Provider Note   CSN: [id number] Arrival date & time: 06/16/22  1855     History  Chief Complaint  Patient presents with   Shortness of Breath   Cough    Angel Brewer is a 56 y.o. female.  56 yo F with a cc of shortness of breath.  Going on for about 3 days now.  She tells me that she spent coughing quite a bit and having a lot of trouble laying back flat.  She unfortunately has a history of severe sleep apnea and had been intubated in the past and required tracheostomy.  She had improvement with this and it was left in.  She has had no difficulty with it recently.  Denies any drainage from the area.  No fevers that she knows of.  No known sick contacts.   Shortness of Breath Associated symptoms: cough   Cough Associated symptoms: shortness of breath        Home Medications Prior to Admission medications   Medication Sig Start Date End Date Taking? Authorizing Provider  acetaminophen (TYLENOL) 160 MG/5ML liquid Take 18.8 mLs (600 mg total) by mouth every 4 (four) hours as needed for fever. 02/24/22   [name], NP  albuterol (VENTOLIN HFA) 108 (90 Base) MCG/ACT inhaler Inhale 1-2 puffs into the lungs every 6 (six) hours as needed for wheezing or shortness of breath. 02/24/22   [name], NP  amLODipine (NORVASC) 5 MG tablet TAKE 1 TABLET (5 MG TOTAL) BY MOUTH DAILY. 06/09/22   [name], NP  apixaban (ELIQUIS) 5 MG TABS tablet Take 1 tablet (5 mg total) by mouth 2 (two) times daily. 03/12/22   [name], NP  atorvastatin (LIPITOR) 20 MG tablet Take 1 tablet (20 mg total) by mouth daily. 02/24/22   [name], NP  dapagliflozin propanediol (FARXIGA) 10 MG TABS tablet Take 1 tablet (10 mg total) by mouth daily. 02/24/22   [name], NP  furosemide (LASIX) 20 MG tablet Take 1 tablet (20 mg total) by mouth daily. 02/24/22   [name], NP  Misc. Devices MISC Please supply patient with  tracheostomy humidifier  Z93.0 03/25/22   [name], NP  PRESCRIPTION MEDICATION 1 each by Other route at bedtime. CPAP- At bedtime    [provider]  spironolactone (ALDACTONE) 25 MG tablet Take 1 tablet (25 mg total) by mouth daily. 02/24/22   [name], NP      Allergies    Patient has no known allergies.    Review of Systems   Review of Systems  Respiratory:  Positive for cough and shortness of breath.     Physical Exam Updated Vital Signs BP (!) 140/91   Pulse 97   Temp 98.9 F (37.2 C) (Oral)   Resp 20   Ht _0  (1.676 m)   Wt (!) 176.9 kg   LMP 11/29/2015   SpO2 92%   BMI 62.95 kg/m  Physical Exam Vitals and nursing note reviewed.  Constitutional:      General: She is not in acute distress.    Appearance: She is well-developed. She is not diaphoretic.     Comments: BMI 63  HENT:     Head: Normocephalic and atraumatic.  Eyes:     Pupils: Pupils are equal, round, and reactive to light.  Neck:     Comments: Trach in place without significant surrounding discharge or erythema.  Passy-Muir valve  in place. Cardiovascular:     Rate and Rhythm: Normal rate and regular rhythm.     Heart sounds: No murmur heard.    No friction rub. No gallop.  Pulmonary:     Effort: Pulmonary effort is normal.     Breath sounds: No wheezing or rales.     Comments: Diminished breath sounds all fields Abdominal:     General: There is no distension.     Palpations: Abdomen is soft.     Tenderness: There is no abdominal tenderness.  Musculoskeletal:        General: No tenderness.     Cervical back: Normal range of motion and neck supple.  Skin:    General: Skin is warm and dry.  Neurological:     Mental Status: She is alert and oriented to person, place, and time.  Psychiatric:        Behavior: Behavior normal.     ED Results / Procedures / Treatments   Labs (all labs ordered are listed, but only abnormal results are displayed) Labs Reviewed  BASIC  METABOLIC PANEL - Abnormal; Notable for the following components:      Result Value   Potassium 3.4 (*)    Glucose, Bld 110 (*)    BUN 21 (*)    Creatinine, Ser 1.48 (*)    Calcium 8.7 (*)    GFR, Estimated 41 (*)    All other components within normal limits  CBC WITH DIFFERENTIAL/PLATELET - Abnormal; Notable for the following components:   nRBC 0.8 (*)    All other components within normal limits  I-STAT VENOUS BLOOD GAS, ED - Abnormal; Notable for the following components:   pCO2, Ven 61.9 (*)    pO2, Ven 60 (*)    Bicarbonate 30.3 (*)    All other components within normal limits  RESP PANEL BY RT-PCR (FLU A&B, COVID) ARPGX2  BRAIN NATRIURETIC PEPTIDE  TROPONIN I (HIGH SENSITIVITY)  TROPONIN I (HIGH SENSITIVITY)    EKG EKG Interpretation  Date/Time:  Monday June 16 2022 20:42:21 EST Ventricular Rate:  90 PR Interval:  184 QRS Duration: 94 QT Interval:  383 QTC Calculation: 469 R Axis:   59 Text Interpretation: Sinus rhythm Low voltage, precordial leads No significant change since last tracing Confirmed by Deno Etienne [phone]) on 06/16/2022 9:14:26 PM  Radiology DG Chest Portable 1 View  Result Date: 06/16/2022 CLINICAL DATA:  Hypoxia EXAM: PORTABLE CHEST 1 VIEW COMPARISON:  Chest x-ray 12/05/2021 FINDINGS: The tip of the tracheostomy is at the level of the clavicular heads, unchanged. The heart is enlarged. There central pulmonary vascular congestion. There is some patchy opacities in the right mid lung and left lung base. There is no pleural effusion or pneumothorax. No acute fractures are seen. IMPRESSION: 1. Cardiomegaly with central pulmonary vascular congestion. 2. Patchy opacities in the right mid lung and left lung base, which may represent edema or infection. Electronically Signed   By: Ronney Asters M.D.   On: 06/16/2022 20:44    Procedures .Critical Care  Performed by: Deno Etienne, DO Authorized by: Deno Etienne, DO   Critical care provider statement:     Critical care time (minutes):  35   Critical care time was exclusive of:  Separately billable procedures and treating other patients   Critical care was time spent personally by me on the following activities:  Development of treatment plan with patient or surrogate, discussions with consultants, evaluation of patient's response to treatment, examination of patient, ordering and  review of laboratory studies, ordering and review of radiographic studies, ordering and performing treatments and interventions, pulse oximetry, re-evaluation of patient's condition and review of old charts   Care discussed with: admitting provider       Medications Ordered in ED Medications  cefTRIAXone (ROCEPHIN) 2 g in sodium chloride 0.9 % 100 mL IVPB (2 g Intravenous New Bag/Given 06/16/22 2133)  azithromycin (ZITHROMAX) 500 mg in sodium chloride 0.9 % 250 mL IVPB (has no administration in time range)    ED Course/ Medical Decision Making/ A&P                           Medical Decision Making Amount and/or Complexity of Data Reviewed Labs: ordered. Radiology: ordered.  Risk Decision regarding hospitalization.   56 yo F with a chief complaint of difficulty breathing.  Going on for about 3 to 4 days.  Having some orthopnea with this.  She has been coughing quite a bit.  Will obtain a chest x-ray blood work.  Patient only on oxygen at night as needed.  Oxygen saturation in the low 70s on arrival here.  Improved on nonrebreather.  My interpretation of the patient's chest x-ray concerning for pneumonia.  Focal infiltrates in the left lung base.  No leukocytosis no anemia no significant electrolyte abnormality.  VBG with very mild hypercarbia.  No confusion.  Will discuss with medicine for admission.          Final Clinical Impression(s) / ED Diagnoses Final diagnoses:  Community acquired pneumonia of left lower lobe of lung  Acute on chronic respiratory failure with hypoxia and hypercapnia [facility])     Rx / DC Orders ED Discharge Orders     None         Deno Etienne, DO 06/16/22 2213

## 2022-06-16 NOTE — Telephone Encounter (Signed)
Reason for Disposition  Continuous (nonstop) coughing interferes with work, school, or sleeping  Answer Assessment - Initial Assessment Questions 1. RESPIRATORY STATUS: "Describe your breathing?" (e.g., wheezing, shortness of breath, unable to speak, severe coughing)      Pt. Has a trach.   I'm coughing a lot and I'm having trouble sleeping.   I'm having to sit up to sleep.   I have a cold. I've had the trach since May.      It's yellow-white mucus.    No fever 2. ONSET: "When did this breathing problem begin?"      A few days probably Saturday.     3. PATTERN "Does the difficult breathing come and go, or has it been constant since it started?"      I'm having coughing non stop and makes me short of breath.   4. SEVERITY: "How bad is your breathing?" (e.g., mild, moderate, severe)    - MILD: No SOB at rest, mild SOB with walking, speaks normally in sentences, can lie down, no retractions, pulse < 100.    - MODERATE: SOB at rest, SOB with minimal exertion and prefers to sit, cannot lie down flat, speaks in phrases, mild retractions, audible wheezing, pulse 100-120.    - SEVERE: Very SOB at rest, speaks in single words, struggling to breathe, sitting hunched forward, retractions, pulse > 120      Mild If I sit and calm down I'm breathing better then I just start coughing.   I have a runny nose.   5. RECURRENT SYMPTOM: "Have you had difficulty breathing before?" If Yes, ask: "When was the last time?" and "What happened that time?"      Not asked 6. CARDIAC HISTORY: "Do you have any history of heart disease?" (e.g., heart attack, angina, bypass surgery, angioplasty)      Not asked 7. LUNG HISTORY: "Do you have any history of lung disease?"  (e.g., pulmonary embolus, asthma, emphysema)     Trach. 8. CAUSE: "What do you think is causing the breathing problem?"      A cold 9. OTHER SYMPTOMS: "Do you have any other symptoms? (e.g., dizziness, runny nose, cough, chest pain, fever)     Runny nose     Has trach but no other symptoms. 10. O2 SATURATION MONITOR:  "Do you use an oxygen saturation monitor (pulse oximeter) at home?" If Yes, ask: "What is your reading (oxygen level) today?" "What is your usual oxygen saturation reading?" (e.g., 95%)       N/A 11. PREGNANCY: "Is there any chance you are pregnant?" "When was your last menstrual period?"       Not asked 12. TRAVEL: "Have you traveled out of the country in the last month?" (e.g., travel history, exposures)       Not asked  Protocols used: Breathing Difficulty-A-AH  Chief Complaint: Pt has a trach and is coughing a lot.   Symptoms: Coughing up clear-yellow mucus.  Has coughing spells causing her to be short of breath.   Runny nose too.   No other symptoms. Frequency: Short of breath during coughing spells Pertinent Negatives: Patient denies any other symptoms other than a runny nose. Disposition: ED /_0 Urgent Care (no appt availability in office) / _1 Appointment(In office/virtual)/ _2  [place] [facility]/ _3 Home Care/ _4 Refused Recommended Disposition /_5 [place] [facility]/ _6  Follow-up with PCP Additional Notes: Pt prefers to go to the ED this evening since there are no appts. Available with [facility] and wellness during the timeframe  needed.  Her niece will taker her later today when she gets off of work.

## 2022-06-16 NOTE — ED Provider Triage Note (Signed)
Emergency Medicine Provider Triage Evaluation Note  Angel Brewer , a 56 y.o. female  was evaluated in triage.  Pt complains of shortness of breath this week.  Patient states she is supposed to wear an oxygen mask over her trach at night but has been out of the materials for this for "a few months."  She states that this past week she has had worsening shortness of breath with new and worsened cough from her baseline.  She denies fevers.  She denies chest pain or palpitations.  Review of Systems  Positive: See above Negative:   Physical Exam  BP (!) 140/91   Pulse 97   Temp 98.9 F (37.2 C) (Oral)   Resp 20   Ht _0  (1.676 m)   Wt (!) 176.9 kg   LMP 11/29/2015   SpO2 92%   BMI 62.95 kg/m  Gen:   Awake, no distress   Resp:  Mildly tachypneic, tracheostomy with Passy-Muir valve over.  Coarse breath sounds bilaterally.  Hypoxia in the waiting room, placed nonrebreather mask over the Passy-Muir valve and she is on nasal cannula satting 94%. MSK:   Moves extremities without difficulty  Other:    Medical Decision Making  Medically screening exam initiated at 7:58 PM.  Appropriate orders placed.  Karren Magwood was informed that the remainder of the evaluation will be completed by another provider, this initial triage assessment does not replace that evaluation, and the importance of remaining in the ED until their evaluation is complete.     Mickie Hillier, PA-C 06/16/22 2000

## 2022-06-17 ENCOUNTER — Other Ambulatory Visit: Payer: Self-pay

## 2022-06-17 ENCOUNTER — Encounter (HOSPITAL_COMMUNITY): Payer: Self-pay | Admitting: Internal Medicine

## 2022-06-17 DIAGNOSIS — J189 Pneumonia, unspecified organism: Secondary | ICD-10-CM | POA: Diagnosis not present

## 2022-06-17 DIAGNOSIS — J9601 Acute respiratory failure with hypoxia: Secondary | ICD-10-CM

## 2022-06-17 LAB — CBC
HCT: 41.5 % (ref 36.0–46.0)
Hemoglobin: 12.5 g/dL (ref 12.0–15.0)
MCH: 28.3 pg (ref 26.0–34.0)
MCHC: 30.1 g/dL (ref 30.0–36.0)
MCV: 94.1 fL (ref 80.0–100.0)
Platelets: 185 10*3/uL (ref 150–400)
RBC: 4.41 MIL/uL (ref 3.87–5.11)
RDW: 14.8 % (ref 11.5–15.5)
WBC: 5.5 10*3/uL (ref 4.0–10.5)
nRBC: 1.1 % — ABNORMAL HIGH (ref 0.0–0.2)

## 2022-06-17 LAB — BASIC METABOLIC PANEL
Anion gap: 8 (ref 5–15)
BUN: 18 mg/dL (ref 6–20)
CO2: 29 mmol/L (ref 22–32)
Calcium: 8.9 mg/dL (ref 8.9–10.3)
Chloride: 103 mmol/L (ref 98–111)
Creatinine, Ser: 1.19 mg/dL — ABNORMAL HIGH (ref 0.44–1.00)
GFR, Estimated: 54 mL/min — ABNORMAL LOW (ref >60)
Glucose, Bld: 108 mg/dL — ABNORMAL HIGH (ref 70–99)
Potassium: 4.8 mmol/L (ref 3.5–5.1)
Sodium: 140 mmol/L (ref 135–145)

## 2022-06-17 LAB — RESPIRATORY PANEL BY PCR

## 2022-06-17 LAB — LACTIC ACID, PLASMA
Lactic Acid, Venous: 0.8 mmol/L (ref 0.5–1.9)
Lactic Acid, Venous: 1.3 mmol/L (ref 0.5–1.9)

## 2022-06-17 LAB — PROCALCITONIN: Procalcitonin: <0.1 ng/mL

## 2022-06-17 LAB — PHOSPHORUS: Phosphorus: 4 mg/dL (ref 2.5–4.6)

## 2022-06-17 LAB — MAGNESIUM: Magnesium: 2.2 mg/dL (ref 1.7–2.4)

## 2022-06-17 MED ORDER — IPRATROPIUM-ALBUTEROL 0.5-2.5 (3) MG/3ML IN SOLN
3.0000 mL | Freq: Four times a day (QID) | RESPIRATORY_TRACT | Status: DC
  Administered 2022-06-17 – 2022-06-19 (×9): 3 mL via RESPIRATORY_TRACT
  Filled 2022-06-17 (×9): qty 3

## 2022-06-17 MED ORDER — AMLODIPINE BESYLATE 5 MG PO TABS
5.0000 mg | ORAL_TABLET | Freq: Every day | ORAL | Status: DC
  Administered 2022-06-18 – 2022-06-24 (×7): 5 mg via ORAL
  Filled 2022-06-17 (×7): qty 1

## 2022-06-17 MED ORDER — SODIUM CHLORIDE 3 % IN NEBU
4.0000 mL | INHALATION_SOLUTION | RESPIRATORY_TRACT | Status: AC
  Administered 2022-06-17: 4 mL via RESPIRATORY_TRACT
  Filled 2022-06-17: qty 4

## 2022-06-17 MED ORDER — ATORVASTATIN CALCIUM 10 MG PO TABS
20.0000 mg | ORAL_TABLET | Freq: Every day | ORAL | Status: DC
  Administered 2022-06-17 – 2022-06-24 (×8): 20 mg via ORAL
  Filled 2022-06-17 (×8): qty 2

## 2022-06-17 MED ORDER — DAPAGLIFLOZIN PROPANEDIOL 10 MG PO TABS
10.0000 mg | ORAL_TABLET | Freq: Every day | ORAL | Status: DC
  Administered 2022-06-18 – 2022-06-24 (×7): 10 mg via ORAL
  Filled 2022-06-17 (×7): qty 1

## 2022-06-17 MED ORDER — APIXABAN 5 MG PO TABS
5.0000 mg | ORAL_TABLET | Freq: Two times a day (BID) | ORAL | Status: DC
  Administered 2022-06-17 – 2022-06-24 (×15): 5 mg via ORAL
  Filled 2022-06-17 (×15): qty 1

## 2022-06-17 MED ORDER — GUAIFENESIN 100 MG/5ML PO LIQD
5.0000 mL | ORAL | Status: DC | PRN
  Administered 2022-06-18 – 2022-06-20 (×3): 5 mL via ORAL
  Filled 2022-06-17: qty 5
  Filled 2022-06-17 (×4): qty 10

## 2022-06-17 MED ORDER — ACETAMINOPHEN 325 MG PO TABS: 650.0000 mg | ORAL_TABLET | Freq: Four times a day (QID) | ORAL | Status: DC | PRN

## 2022-06-17 MED ORDER — SPIRONOLACTONE 25 MG PO TABS
25.0000 mg | ORAL_TABLET | Freq: Every day | ORAL | Status: DC
  Administered 2022-06-17 – 2022-06-24 (×8): 25 mg via ORAL
  Filled 2022-06-17 (×8): qty 1

## 2022-06-17 MED ORDER — ACETYLCYSTEINE 20 % IN SOLN
3.0000 mL | Freq: Once | RESPIRATORY_TRACT | Status: DC
  Filled 2022-06-17: qty 4

## 2022-06-17 MED ORDER — PROCHLORPERAZINE EDISYLATE 10 MG/2ML IJ SOLN: 5.0000 mg | Freq: Four times a day (QID) | INTRAMUSCULAR | Status: DC | PRN

## 2022-06-17 MED ORDER — SODIUM CHLORIDE 0.9 % IV SOLN
500.0000 mg | INTRAVENOUS | Status: DC
  Administered 2022-06-18 (×2): 500 mg via INTRAVENOUS
  Filled 2022-06-17 (×2): qty 5

## 2022-06-17 MED ORDER — FUROSEMIDE 10 MG/ML IJ SOLN
40.0000 mg | Freq: Every day | INTRAMUSCULAR | Status: DC
  Administered 2022-06-17 – 2022-06-19 (×3): 40 mg via INTRAVENOUS
  Filled 2022-06-17 (×3): qty 4

## 2022-06-17 MED ORDER — MELATONIN 5 MG PO TABS
5.0000 mg | ORAL_TABLET | Freq: Every evening | ORAL | Status: DC | PRN
  Filled 2022-06-17: qty 1

## 2022-06-17 MED ORDER — POLYETHYLENE GLYCOL 3350 17 G PO PACK: 17.0000 g | PACK | Freq: Every day | ORAL | Status: DC | PRN

## 2022-06-17 MED ORDER — SODIUM CHLORIDE 3 % IN NEBU
4.0000 mL | INHALATION_SOLUTION | Freq: Two times a day (BID) | RESPIRATORY_TRACT | Status: AC
  Administered 2022-06-18 – 2022-06-20 (×5): 4 mL via RESPIRATORY_TRACT
  Filled 2022-06-17 (×6): qty 4

## 2022-06-17 MED ORDER — SODIUM CHLORIDE 0.9 % IV SOLN
2.0000 g | INTRAVENOUS | Status: DC
  Administered 2022-06-18 – 2022-06-19 (×3): 2 g via INTRAVENOUS
  Filled 2022-06-17 (×3): qty 20

## 2022-06-17 NOTE — ED Notes (Signed)
CCM at bedside

## 2022-06-17 NOTE — Consult Note (Signed)
NAMEJordanna Brewer, MRN:  [id number], DOB:  03-May-1966, LOS: 1 ADMISSION DATE:  06/16/2022 CONSULTATION DATE:  06/17/2022 REFERRING MD:  [place] Crane - TRH CHIEF COMPLAINT:  Hypoxia, tracheostomy management   History of Present Illness:  56 year old woman who presented to [facility] ED 12/4 for persistent SOB x 2-3 days with productive cough and increased secretions from trach. PMHx significant for HTN, HLD, chronic diastolic CHF (Echo 03/3789 with EF 60-65%, mild LVH), OHS/OSA s/p tracheostomy 11/2021 (on nocturnal O2 PRN, followed by [name], NP), DVT/PE (on Eliquis).  On ED arrival, patient reported 3-day history of SOB and cough worsened from baseline; she also reported orthopnea. Denies fever/chills, CP, significant edema, known sick contacts. Vitals in ED were notable for hypoxia with SpO2 92% on 15L NRB, mild tachycardia to low 100s, otherwise WNL. Labs with normal CBC, BMP grossly WNL with mild Cr elevation (baseline), BNP/trop WNL, VBG with minimal hypercarbia. COVID/flu negative. LA pending. CXR showed cardiomegaly and patchy opacities of R mid-lung and L lung base. Medicine called for admission.  While awaiting inpatient bed, patient had an episode of desaturation to 80s on 15L with associated increased secretions. Hypertonic saline neb and Mucomyst were administered and patient was suctioned. Resp Cx/TA was ordered and CAP coverage initiated.  PCCM consulted for assistance with hypoxia and tracheostomy/secretion management.  Pertinent Medical History:   Past Medical History:  Diagnosis Date   Chronic diastolic CHF (congestive heart failure) ([place]) 01/05/2022   DVT (deep vein thrombosis) in pregnancy    RLE DVT 02/2016   Dyspnea    Essential hypertension 08/25/2017   Hyperlipidemia 11/21/2021   Morbid obesity ([place])    Obesity hypoventilation syndrome ([place]) 05/03/2021   PE (pulmonary embolism) 02/29/2016   Sleep apnea    uses a cpap   Tracheostomy present ([place]) 01/05/2022   Size 6  shiley/cuffless  Last changed: 10/9     Discussion  Stable trach.  She does get a Wojnarowski more short of breath with exertion.  Although I did not observe it on my exam she does endorse she will occasionally have a rush of air after removing PMV following activity.  This could suggest some mild air trapping from the trach.  Otherwise she is doing fantastic with the [facility] Events: Including procedures, antibiotic start and stop dates in addition to other pertinent events   12/5 - PCCM consulted for assistance with management of hypoxia and increased secretions in the setting of tracheostomy  Interim History / Subjective:  PCCM consulted for assistance with management of hypoxia and increased secretions in the setting of tracheostomy.  Objective:  Blood pressure (!) 146/84, pulse 97, temperature 98.3 F (36.8 C), temperature source Oral, resp. rate (!) 26, height _0  (1.676 m), weight (!) 176.9 kg, last menstrual period 11/29/2015, SpO2 93 %.    FiO2 (%):  [95 %] 95 %  No intake or output data in the 24 hours ending 06/17/22 0526 Filed Weights   06/16/22 1951  Weight: (!) 176.9 kg   Physical Examination: General: Acute-on-chronically ill-appearing middle-aged woman in NAD, appears uncomfortable. HEENT: [place]/AT, anicteric sclera, PERRL, moist mucous membranes. Neck: Tracheostomy in place with copious clear-yellow, blood-tinged secretions. No surrounding erythema. ATC in place, PMV present but removed. Neuro: Awake, oriented x 4. Responds to verbal stimuli. Following commands consistently. Moves all 4 extremities spontaneously. CV: RRR, no m/g/r. PULM: Breathing even and mildly labored on ATC (14L) in the setting of  persistent cough. Lung fields with mild expiratory wheeze, scattered rhonchi R > L. GI: Obese, soft, nontender, nondistended. Normoactive bowel sounds. Extremities: No significant LE edema noted. Skin: Warm/dry, no rashes.  Resolved  Hospital Problem List:    Assessment & Plan:   Ms. Angel Brewer is seen in consultation at the request of Dr. [place] Crane [facility]) for recommendations on management of hypoxia and increased secretions in the setting of tracheostomy.  56 year old woman who presented to [facility] ED 12/4 for persistent SOB x 2-3 days with productive cough and increased secretions from trach. History of OHS/OSA requiring initial tracheostomy creation 11/2021. Baseline requirement of nocturnal O2 PRN. Patient is followed in tracheostomy clinic by [name], NP [facility] PCCM).  CAP Tracheostomy, 11/2021 OSA/OHS - Continue supplemental O2 support, continuous + QHS PRN; if requiring return to MV patient would need trach exchange to cuffed (currently Shiley 6 cuffless) - Wean O2 for sat > 90% - Bronchodilators (DuoNeb, albuterol PRN) - Hypertonic saline nebs to mobilize secretions; d/c Mucomyst - Pulmonary hygiene (Chest PT, expectorant, frequent suctioning) - Tracheostomy care per protocol - PMV at rest, remove while ambulating; per patient occasional "rush of air" after removing PMV post-activity, concern for mild air-trapping - CAP coverage with ceftriaxone, azithromycin - Optimize volume status as able; diuresis as tolerated  PCCM will continue to follow.  Best Practice: (right click and "Reselect all SmartList Selections" daily)   Per Primary Team  Labs:  CBC: Recent Labs  Lab 06/16/22 2005 06/16/22 2114 06/17/22 0430  WBC 5.1  --  5.5  NEUTROABS 3.1  --   --   HGB 12.6 13.3 12.5  HCT 39.8 39.0 41.5  MCV 90.2  --  94.1  PLT 185  --  462   Basic Metabolic Panel: Recent Labs  Lab 06/16/22 2005 06/16/22 2114 06/17/22 0430  NA 137 140 140  K 3.4* 3.9 4.8  CL 103  --  103  CO2 25  --  29  GLUCOSE 110*  --  108*  BUN 21*  --  18  CREATININE 1.48*  --  1.19*  CALCIUM 8.7*  --  8.9  MG  --   --  2.2  PHOS  --   --  4.0   GFR: Estimated Creatinine Clearance: 88.6 mL/min (A) (by C-G formula based on SCr of  1.19 mg/dL (H)). Recent Labs  Lab 06/16/22 2005 06/17/22 0430  WBC 5.1 5.5   Liver Function Tests: No results for input(s): "AST", "ALT", "ALKPHOS", "BILITOT", "PROT", "ALBUMIN" in the last 168 hours. No results for input(s): "LIPASE", "AMYLASE" in the last 168 hours. No results for input(s): "AMMONIA" in the last 168 hours.  ABG:    Component Value Date/Time   PHART 7.458 (H) 11/25/2021 0353   PCO2ART 59.3 (H) 11/25/2021 0353   PO2ART 79 (L) 11/25/2021 0353   HCO3 30.3 (H) 06/16/2022 2114   TCO2 32 06/16/2022 2114   ACIDBASEDEF 4.0 (H) 05/01/2021 0158   O2SAT 87 06/16/2022 2114    Coagulation Profile: No results for input(s): "INR", "PROTIME" in the last 168 hours.  Cardiac Enzymes: No results for input(s): "CKTOTAL", "CKMB", "CKMBINDEX", "TROPONINI" in the last 168 hours.  HbA1C: Hemoglobin A1C  Date/Time Value Ref Range Status  06/10/2016 04:41 PM 5.4  Final   CBG: No results for input(s): "GLUCAP" in the last 168 hours.  Review of Systems:   Review of systems completed with pertinent positives/negatives outlined in above HPI.  Past Medical History:  She,  has a past medical  history of Chronic diastolic CHF (congestive heart failure) ([place]) (01/05/2022), DVT (deep vein thrombosis) in pregnancy, Dyspnea, Essential hypertension (08/25/2017), Hyperlipidemia (11/21/2021), Morbid obesity ([place]), Obesity hypoventilation syndrome ([place]) (05/03/2021), PE (pulmonary embolism) (02/29/2016), Sleep apnea, and Tracheostomy present ([place]) (01/05/2022).   Surgical History:   Past Surgical History:  Procedure Laterality Date   CESAREAN SECTION     x 3   OPEN REDUCTION INTERNAL FIXATION (ORIF) DISTAL RADIAL FRACTURE Right 06/20/2019   Procedure: OPEN REDUCTION INTERNAL FIXATION (ORIF) DISTAL RADIAL FRACTURE;  Surgeon: [name], MD;  Location: [place];  Service: Orthopedics;  Laterality: Right;  REGIONAL BLOCK TOTAL SURGERY TIME: 90 MINUTES   SYNOVECTOMY Right 11/21/2019    Procedure: RIGHT WRIST LIGAMENT RECONSTRUCTION;  Surgeon: [name], MD;  Location: [place];  Service: Orthopedics;  Laterality: Right;  PROCEDURE: RIGHT WRIST LIGAMENT RECONSTRUCTION LENGTH OF SURGERY: 105 MIN    Social History:   reports that she has quit smoking. She has never used smokeless tobacco. She reports that she does not drink alcohol and does not use drugs.   Family History:  Her family history includes Hypertension in her sister.   Allergies: No Known Allergies   Home Medications: Prior to Admission medications   Medication Sig Start Date End Date Taking? Authorizing Provider  acetaminophen (TYLENOL) 160 MG/5ML liquid Take 18.8 mLs (600 mg total) by mouth every 4 (four) hours as needed for fever. 02/24/22   [name], NP  albuterol (VENTOLIN HFA) 108 (90 Base) MCG/ACT inhaler Inhale 1-2 puffs into the lungs every 6 (six) hours as needed for wheezing or shortness of breath. 02/24/22   [name], NP  amLODipine (NORVASC) 5 MG tablet TAKE 1 TABLET (5 MG TOTAL) BY MOUTH DAILY. 06/09/22   [name], NP  apixaban (ELIQUIS) 5 MG TABS tablet Take 1 tablet (5 mg total) by mouth 2 (two) times daily. 03/12/22   [name], NP  atorvastatin (LIPITOR) 20 MG tablet Take 1 tablet (20 mg total) by mouth daily. 02/24/22   [name], NP  dapagliflozin propanediol (FARXIGA) 10 MG TABS tablet Take 1 tablet (10 mg total) by mouth daily. 02/24/22   [name], NP  furosemide (LASIX) 20 MG tablet Take 1 tablet (20 mg total) by mouth daily. 02/24/22   [name], NP  Misc. Devices MISC Please supply patient with tracheostomy humidifier  Z93.0 03/25/22   [name], NP  PRESCRIPTION MEDICATION 1 each by Other route at bedtime. CPAP- At bedtime    [provider]  spironolactone (ALDACTONE) 25 MG tablet Take 1 tablet (25 mg total) by mouth daily. 02/24/22   [name], NP    Critical care time: N/A   Rhae Lerner [place]  Pulmonary & Critical Care 06/17/22 5:26 AM  Please see [email or web address] for pager details.  From 7A-7P if no response, please call [phone] After hours, please call ELink [phone]

## 2022-06-17 NOTE — ED Notes (Signed)
Upon assessment of patient, patient increased labor of breathing. Increased coughing and sputum. Satting 84-87% with NRB mask over trach. RN contacted RT to bedside. RT performed inner cannula cleaning with deep suction.

## 2022-06-17 NOTE — Progress Notes (Signed)
Received a call from bedside RN regarding the patient having increase in secretions and desaturation with O2 sat in the mid 80's on 15L NRB.  Presented at bedside.  RT and bedside RN assisted with suctioning.  Stat hypersaline neb and mucomyst ordered.  PCCM consulted to assist with trach care and management.  They agreed to see the patient.  Appreciate PCCM's assistance.  Sputum culture ordered and will follow for ID and sensitivities.  We will continue to closely monitor and treat as indicated.  Time:  15 minutes

## 2022-06-17 NOTE — Progress Notes (Signed)
NAMEFelicha Brewer, MRN:  [id number], DOB:  [birth year], LOS: 1 ADMISSION DATE:  06/16/2022 CONSULTATION DATE:  06/17/2022 REFERRING MD:  [place] Crane - TRH CHIEF COMPLAINT:  Hypoxia, tracheostomy management   History of Present Illness:  56 year old woman who presented to [facility] ED 12/4 for persistent SOB x 2-3 days with productive cough and increased secretions from trach. PMHx significant for HTN, HLD, chronic diastolic CHF (Echo 0/7867 with EF 60-65%, mild LVH), OHS/OSA s/p tracheostomy 11/2021 (on nocturnal O2 PRN, followed by [name], NP), DVT/PE (on Eliquis).  On ED arrival, patient reported 3-day history of SOB and cough worsened from baseline; she also reported orthopnea. Denies fever/chills, CP, significant edema, known sick contacts. Vitals in ED were notable for hypoxia with SpO2 92% on 15L NRB, mild tachycardia to low 100s, otherwise WNL. Labs with normal CBC, BMP grossly WNL with mild Cr elevation (baseline), BNP/trop WNL, VBG with minimal hypercarbia. COVID/flu negative. LA pending. CXR showed cardiomegaly and patchy opacities of R mid-lung and L lung base. Medicine called for admission.  While awaiting inpatient bed, patient had an episode of desaturation to 80s on 15L with associated increased secretions. Hypertonic saline neb and Mucomyst were administered and patient was suctioned. Resp Cx/TA was ordered and CAP coverage initiated.  PCCM consulted for assistance with hypoxia and tracheostomy/secretion management.  Pertinent Medical History:   Past Medical History:  Diagnosis Date   Chronic diastolic CHF (congestive heart failure) ([place]) 01/05/2022   DVT (deep vein thrombosis) in pregnancy    RLE DVT 02/2016   Dyspnea    Essential hypertension 08/25/2017   Hyperlipidemia 11/21/2021   Morbid obesity ([place])    Obesity hypoventilation syndrome ([place]) 05/03/2021   PE (pulmonary embolism) 02/29/2016   Sleep apnea    uses a cpap   Tracheostomy present ([place]) 01/05/2022   Size 6  shiley/cuffless  Last changed: 10/9     Discussion  Stable trach.  She does get a [place] more short of breath with exertion.  Although I did not observe it on my exam she does endorse she will occasionally have a rush of air after removing PMV following activity.  This could suggest some mild air trapping from the trach.  Otherwise she is doing fantastic with the [facility] Events: Including procedures, antibiotic start and stop dates in addition to other pertinent events   12/5 - PCCM consulted for assistance with management of hypoxia and increased secretions in the setting of tracheostomy  Interim History / Subjective:  06/17/2022 no acute distress she was seen at 0530 hrs. per night team follow-up per day team at 0 815 Objective:  Blood pressure 138/81, pulse 83, temperature 98.6 F (37 C), temperature source Oral, resp. rate (!) 25, height _0  (1.676 m), weight (!) 176.9 kg, last menstrual period 11/29/2015, SpO2 100 %.    FiO2 (%):  [95 %] 95 %  No intake or output data in the 24 hours ending 06/17/22 0807 Filed Weights   06/16/22 1951  Weight: (!) 176.9 kg   Physical Examination: General: Morbidly obese at 390 pounds HEENT: MM pink/moist #6 cuffless trach in place Neuro: Grossly intact without focal defect CV: Heart sounds are regular PULM: Mild rhonchi Trach collar  GI: soft, bsx4 active  GU: Voids Extremities: warm/dry, 2+ edema  Skin: no rashes or lesions  Resolved Hospital Problem List:    Assessment & Plan:   Ms. Angel Brewer is seen in consultation at  the request of Dr. [place] Crane [facility]) for recommendations on management of hypoxia and increased secretions in the setting of tracheostomy.  56 year old woman who presented to [facility] ED 12/4 for persistent SOB x 2-3 days with productive cough and increased secretions from trach. History of OHS/OSA requiring initial tracheostomy creation 11/2021. Baseline requirement of nocturnal O2 PRN. Patient  is followed in tracheostomy clinic by [name], NP [facility] PCCM).  CAP Tracheostomy, 11/2021 OSA/OHS Oxygen supplementation to keep sats 88% or greater Continue pulmonary toilet avoid deep suctioning If worsen would require cuffed trach currently has #6 cuffless Continue bronchodilators Continue hypertonic saline nebs Chest percussion Passy-Muir valve as tolerated Continue antimicrobial therapy Diuresis as tolerated    PCCM will continue to follow.  Best Practice: (right click and "Reselect all SmartList Selections" daily)   Per Primary Team  Labs:  CBC: Recent Labs  Lab 06/16/22 2005 06/16/22 2114 06/17/22 0430  WBC 5.1  --  5.5  NEUTROABS 3.1  --   --   HGB 12.6 13.3 12.5  HCT 39.8 39.0 41.5  MCV 90.2  --  94.1  PLT 185  --  887   Basic Metabolic Panel: Recent Labs  Lab 06/16/22 2005 06/16/22 2114 06/17/22 0430  NA 137 140 140  K 3.4* 3.9 4.8  CL 103  --  103  CO2 25  --  29  GLUCOSE 110*  --  108*  BUN 21*  --  18  CREATININE 1.48*  --  1.19*  CALCIUM 8.7*  --  8.9  MG  --   --  2.2  PHOS  --   --  4.0   GFR: Estimated Creatinine Clearance: 88.6 mL/min (A) (by C-G formula based on SCr of 1.19 mg/dL (H)). Recent Labs  Lab 06/16/22 2005 06/17/22 0430  PROCALCITON  --  <0.10  WBC 5.1 5.5   Liver Function Tests: No results for input(s): "AST", "ALT", "ALKPHOS", "BILITOT", "PROT", "ALBUMIN" in the last 168 hours. No results for input(s): "LIPASE", "AMYLASE" in the last 168 hours. No results for input(s): "AMMONIA" in the last 168 hours.  ABG:    Component Value Date/Time   PHART 7.458 (H) 11/25/2021 0353   PCO2ART 59.3 (H) 11/25/2021 0353   PO2ART 79 (L) 11/25/2021 0353   HCO3 30.3 (H) 06/16/2022 2114   TCO2 32 06/16/2022 2114   ACIDBASEDEF 4.0 (H) 05/01/2021 0158   O2SAT 87 06/16/2022 2114    Coagulation Profile: No results for input(s): "INR", "PROTIME" in the last 168 hours.  Cardiac Enzymes: No results for input(s): "CKTOTAL",  "CKMB", "CKMBINDEX", "TROPONINI" in the last 168 hours.  HbA1C: Hemoglobin A1C  Date/Time Value Ref Range Status  06/10/2016 04:41 PM 5.4  Final   CBG: No results for input(s): "GLUCAP" in the last 168 hours.   Critical care time: N/A  \Steve Morgana Rowley ACNP Acute Care Nurse Practitioner [place] Please consult Amion 06/17/2022, 8:08 AM

## 2022-06-17 NOTE — H&P (Signed)
History and Physical  Angel Brewer QJJ:[id number] DOB: 1965/09/11 DOA: 06/16/2022  Referring physician: Dr. Myna Hidalgo, [place]. PCP: Angel Pounds, NP  Outpatient Specialists: Cardiology, pulmonology. Patient coming from: Home.  Chief Complaint: Shortness of breath and cough.  HPI: Angel Brewer is a 56 y.o. female with medical history significant for severe morbid obesity, hyperlipidemia, history of respiratory failure status post tracheostomy placement on 4/81/8563, chronic diastolic CHF, DVT/pulmonary embolism on Eliquis, who presented to [facility] ED from home with complaints of progressive dyspnea for the past 3 days.  Associated with persistent productive cough.  At baseline she is on O2 supplementation nightly as needed.    Upon arrival to the ED she is noted to be severely hypoxic with O2 saturation in the low 70s, improved with nonrebreather.  Chest x-ray revealed multifocal infiltrates suggestive of pneumonia, VBG revealing respiratory acidosis with pCO2 61.9 and pH of 7.290.  The patient was started on empiric IV antibiotics in the ED.  TRH, hospitalist service, was asked to admit.  ED Course: Tmax 98.9.  BP 131/77, pulse 80, respiratory rate 19, O2 saturation 100% on r nonrebreather.  Lab studies remarkable for serum potassium 3.4, BUN 21, creatinine 1.48, GFR 41 with baseline GFR greater than 60.  BNP 25.  Troponin 13, 12.  Review of Systems: Review of systems as noted in the HPI. All other systems reviewed and are negative.   Past Medical History:  Diagnosis Date   DVT (deep vein thrombosis) in pregnancy    RLE DVT 02/2016   Dyspnea    Morbid obesity (HCC)    PE (pulmonary embolism) 02/29/2016   Sleep apnea    uses a cpap   Past Surgical History:  Procedure Laterality Date   CESAREAN SECTION     x 3   OPEN REDUCTION INTERNAL FIXATION (ORIF) DISTAL RADIAL FRACTURE Right 06/20/2019   Procedure: OPEN REDUCTION INTERNAL FIXATION (ORIF) DISTAL RADIAL FRACTURE;  Surgeon: [name], MD;  Location: [facility];  Service: Orthopedics;  Laterality: Right;  REGIONAL BLOCK TOTAL SURGERY TIME: 90 MINUTES   SYNOVECTOMY Right 11/21/2019   Procedure: RIGHT WRIST LIGAMENT RECONSTRUCTION;  Surgeon: [name], MD;  Location: [facility];  Service: Orthopedics;  Laterality: Right;  PROCEDURE: RIGHT WRIST LIGAMENT RECONSTRUCTION LENGTH OF SURGERY: 105 MIN    Social History:  reports that she has quit smoking. She has never used smokeless tobacco. She reports that she does not drink alcohol and does not use drugs.   No Known Allergies  Family History  Problem Relation Age of Onset   Hypertension Sister       Prior to Admission medications   Medication Sig Start Date End Date Taking? Authorizing Provider  acetaminophen (TYLENOL) 160 MG/5ML liquid Take 18.8 mLs (600 mg total) by mouth every 4 (four) hours as needed for fever. 02/24/22   Angel Pounds, NP  albuterol (VENTOLIN HFA) 108 (90 Base) MCG/ACT inhaler Inhale 1-2 puffs into the lungs every 6 (six) hours as needed for wheezing or shortness of breath. 02/24/22   Angel Pounds, NP  amLODipine (NORVASC) 5 MG tablet TAKE 1 TABLET (5 MG TOTAL) BY MOUTH DAILY. 06/09/22   Angel Pounds, NP  apixaban (ELIQUIS) 5 MG TABS tablet Take 1 tablet (5 mg total) by mouth 2 (two) times daily. 03/12/22   Angel Pounds, NP  atorvastatin (LIPITOR) 20 MG tablet Take 1 tablet (20 mg total) by mouth daily. 02/24/22   Angel Pounds, NP  dapagliflozin propanediol (FARXIGA) 10 MG TABS  tablet Take 1 tablet (10 mg total) by mouth daily. 02/24/22   Angel Pounds, NP  furosemide (LASIX) 20 MG tablet Take 1 tablet (20 mg total) by mouth daily. 02/24/22   Angel Pounds, NP  Misc. Devices MISC Please supply patient with tracheostomy humidifier  Z93.0 03/25/22   Angel Pounds, NP  PRESCRIPTION MEDICATION 1 each by Other route at bedtime. CPAP- At bedtime    [provider]  spironolactone (ALDACTONE) 25 MG tablet Take 1 tablet (25 mg  total) by mouth daily. 02/24/22   Angel Pounds, NP    Physical Exam: BP 131/77   Pulse 80   Temp 98.3 F (36.8 C) (Oral)   Resp 19   Ht _0  (1.676 m)   Wt (!) 176.9 kg   LMP 11/29/2015   SpO2 100%   BMI 62.95 kg/m   General: 56 y.o. year-old female well developed well nourished in no acute distress.  Alert and oriented x3. Cardiovascular: Regular rate and rhythm with no rubs or gallops.  No thyromegaly or JVD noted.  Trace lower extremity edema. 2/4 pulses in all 4 extremities. Respiratory: Mild rales at bases with no wheezing noted. Good inspiratory effort. Abdomen: Soft nontender nondistended with normal bowel sounds x4 quadrants. Muskuloskeletal: No cyanosis or clubbing.  Trace edema noted in lower extremities bilaterally. Neuro: CN II-XII intact, strength, sensation, reflexes Skin: No ulcerative lesions noted or rashes Psychiatry: Judgement and insight appear normal. Mood is appropriate for condition and setting          Labs on Admission:  Basic Metabolic Panel: Recent Labs  Lab 06/16/22 2005 06/16/22 2114  NA 137 140  K 3.4* 3.9  CL 103  --   CO2 25  --   GLUCOSE 110*  --   BUN 21*  --   CREATININE 1.48*  --   CALCIUM 8.7*  --    Liver Function Tests: No results for input(s): "AST", "ALT", "ALKPHOS", "BILITOT", "PROT", "ALBUMIN" in the last 168 hours. No results for input(s): "LIPASE", "AMYLASE" in the last 168 hours. No results for input(s): "AMMONIA" in the last 168 hours. CBC: Recent Labs  Lab 06/16/22 2005 06/16/22 2114  WBC 5.1  --   NEUTROABS 3.1  --   HGB 12.6 13.3  HCT 39.8 39.0  MCV 90.2  --   PLT 185  --    Cardiac Enzymes: No results for input(s): "CKTOTAL", "CKMB", "CKMBINDEX", "TROPONINI" in the last 168 hours.  BNP (last 3 results) Recent Labs    11/21/21 1827 06/16/22 2040  BNP 139.2* 25.0    ProBNP (last 3 results) No results for input(s): "PROBNP" in the last 8760 hours.  CBG: No results for input(s): "GLUCAP" in the  last 168 hours.  Radiological Exams on Admission: DG Chest Portable 1 View  Result Date: 06/16/2022 CLINICAL DATA:  Hypoxia EXAM: PORTABLE CHEST 1 VIEW COMPARISON:  Chest x-ray 12/05/2021 FINDINGS: The tip of the tracheostomy is at the level of the clavicular heads, unchanged. The heart is enlarged. There central pulmonary vascular congestion. There is some patchy opacities in the right mid lung and left lung base. There is no pleural effusion or pneumothorax. No acute fractures are seen. IMPRESSION: 1. Cardiomegaly with central pulmonary vascular congestion. 2. Patchy opacities in the right mid lung and left lung base, which may represent edema or infection. Electronically Signed   By: Ronney Asters M.D.   On: 06/16/2022 20:44    EKG: I independently viewed the EKG  done and my findings are as followed: Sinus rhythm rate of 90.  Nonspecific ST-T changes.  QTc 469.  Assessment/Plan Present on Admission:  CAP (community acquired pneumonia)  Principal Problem:   CAP (community acquired pneumonia)  Multifocal community-acquired pneumonia, POA Rocephin and azithromycin started in ED, continue. DuoNeb every 6 hours Antitussives as needed Mobilize as tolerated.  Chronic diastolic CHF Resume home cardiac medications Start strict I's and O's and daily weight  History of pulmonary embolism/DVT on Eliquis Resume home Eliquis No overt bleeding  Severe morbid obesity BMI 62 Recommend weight loss outpatient with regular physical activity and healthy dieting.   DVT prophylaxis: Home Eliquis  Code Status: Full code  Family Communication: None at bedside  Disposition Plan: Admitted to progressive care unit  Consults called: None.  Admission status: Inpatient status.   Status is: Inpatient The patient will require at least 2 midnights for further evaluation and treatment of present condition.   Kayleen Memos MD [facility] Pager [phone]  If 7PM-7AM, please contact  night-coverage [email or web address] Password [facility]  06/17/2022, 3:33 AM

## 2022-06-17 NOTE — ED Notes (Signed)
RT at bedside to suction and administer breathing treatment.

## 2022-06-17 NOTE — ED Notes (Signed)
Speech called to see patient due to concerns about aspiration.

## 2022-06-17 NOTE — Progress Notes (Addendum)
PROGRESS NOTE    Angel Brewer  QGB:[id number] DOB: 07-04-1966 DOA: 06/16/2022 PCP: [name], NP   Chief Complaint  Patient presents with   Shortness of Breath   Cough    Brief Narrative:    This is a no charge note as patient seen and admitted earlier today by Dr. Nevada Crane, chart, imaging and labs were reviewed, patient was seen and examined.    Angel Brewer is a 56 y.o. female with medical history significant for severe morbid obesity, hyperlipidemia, history of respiratory failure status post tracheostomy placement on 9/75/8832, chronic diastolic CHF, DVT/pulmonary embolism on Eliquis, who presented to [facility] ED from home with complaints of progressive dyspnea for the past 3 days.  Associated with persistent productive cough.  At baseline she is on O2 supplementation nightly as needed.     Upon arrival to the ED she is noted to be severely hypoxic with O2 saturation in the low 70s, improved with nonrebreather.  Chest x-ray revealed multifocal infiltrates suggestive of pneumonia, VBG revealing respiratory acidosis with pCO2 61.9 and pH of 7.290.  The patient was started on empiric IV antibiotics in the ED.  TRH, hospitalist service, was asked to admit.   ED Course: Tmax 98.9.  BP 131/77, pulse 80, respiratory rate 19, O2 saturation 100% on r nonrebreather.  Lab studies remarkable for serum potassium 3.4, BUN 21, creatinine 1.48, GFR 41 with baseline GFR greater than 60.  BNP 25.  Troponin 13, 12.     Assessment & Plan:   Principal Problem:   CAP (community acquired pneumonia)  Multifocal community-acquired pneumonia, POA Tracheostomy OSA/OHS Continue with IV Rocephin and azithromycin. -Continue with pulmonary toilet, avoid deep suctioning.  PCCM recommendation For worsening or needed creased oxygen requirement, this she will need her trach changed to cuffed, she is currently has size 6 cuffless Continue bronchodilators Continue hypertonic saline nebs Continue with chest  percussions Mobilize as tolerated.   Acute on chronic diastolic CHF  -The BNP appears within normal limit, but her chest x-ray showing vascular congestion, will start on IV Lasix 40 mg daily, likely dose will need to be uptitrated Resume home cardiac medications Aldactone and Farxiga Start strict I's and O's and daily weight  Hypertension -Resume norvasc   History of pulmonary embolism/DVT on Eliquis Resume home Eliquis No overt bleeding   Severe morbid obesity BMI 62 Recommend weight loss outpatient with regular physical activity and healthy dieting.  DVT prophylaxis: Eliquis Code Status: Full Family Communication: none at bedside Disposition:   Status is: Inpatient    Consultants:  PCCM   Subjective:  Reports cough, mild dyspnea, but reports she is feeling better.  Objective: Vitals:   06/17/22 1330 06/17/22 1343 06/17/22 1414 06/17/22 1543  BP: 121/82   (!) 155/77  Pulse: 67 70  79  Resp: 20 (!) 23  17  Temp:   98.5 F (36.9 C)   TempSrc:      SpO2: 90% 94%  92%  Weight:      Height:       No intake or output data in the 24 hours ending 06/17/22 1624 Filed Weights   06/16/22 1951  Weight: (!) 176.9 kg    Examination:  Awake Alert, Oriented X 3, morbid obesity, laying in bed, no apparent distress, but she is with significant coughing, trach present with significant secretions Symmetrical Chest wall movement, good eye, RRR,No Gallops,Rubs or new Murmurs, No Parasternal Heave +ve B.Sounds, Abd Soft, No tenderness, No rebound - guarding or rigidity.  No Cyanosis, Clubbing ,+1  edema, No new Rash or bruise      Data Reviewed: I have personally reviewed following labs and imaging studies  CBC: Recent Labs  Lab 06/16/22 2005 06/16/22 2114 06/17/22 0430  WBC 5.1  --  5.5  NEUTROABS 3.1  --   --   HGB 12.6 13.3 12.5  HCT 39.8 39.0 41.5  MCV 90.2  --  94.1  PLT 185  --  196    Basic Metabolic Panel: Recent Labs  Lab 06/16/22 2005  06/16/22 2114 06/17/22 0430  NA 137 140 140  K 3.4* 3.9 4.8  CL 103  --  103  CO2 25  --  29  GLUCOSE 110*  --  108*  BUN 21*  --  18  CREATININE 1.48*  --  1.19*  CALCIUM 8.7*  --  8.9  MG  --   --  2.2  PHOS  --   --  4.0    GFR: Estimated Creatinine Clearance: 88.6 mL/min (A) (by C-G formula based on SCr of 1.19 mg/dL (H)).  Liver Function Tests: No results for input(s): "AST", "ALT", "ALKPHOS", "BILITOT", "PROT", "ALBUMIN" in the last 168 hours.  CBG: No results for input(s): "GLUCAP" in the last 168 hours.   Recent Results (from the past 240 hour(s))  Resp Panel by RT-PCR (Flu A&B, Covid) Anterior Nasal Swab     Status: None   Collection Time: 06/16/22  8:40 PM   Specimen: Anterior Nasal Swab  Result Value Ref Range Status   SARS Coronavirus 2 by RT PCR NEGATIVE NEGATIVE Final    Comment: (NOTE) SARS-CoV-2 target nucleic acids are NOT DETECTED.  The SARS-CoV-2 RNA is generally detectable in upper respiratory specimens during the acute phase of infection. The lowest concentration of SARS-CoV-2 viral copies this assay can detect is 138 copies/mL. A negative result does not preclude SARS-Cov-2 infection and should not be used as the sole basis for treatment or other patient management decisions. A negative result may occur with  improper specimen collection/handling, submission of specimen other than nasopharyngeal swab, presence of viral mutation(s) within the areas targeted by this assay, and inadequate number of viral copies(<138 copies/mL). A negative result must be combined with clinical observations, patient history, and epidemiological information. The expected result is Negative.  Fact Sheet for Patients:  EntrepreneurPulse.com.au  Fact Sheet for Healthcare Providers:  IncredibleEmployment.be  This test is no t yet approved or cleared by the [place] FDA and  has been authorized for detection and/or diagnosis of  SARS-CoV-2 by FDA under an Emergency Use Authorization (EUA). This EUA will remain  in effect (meaning this test can be used) for the duration of the COVID-19 declaration under Section 564(b)(1) of the Act, 21 U.S.C.section 360bbb-3(b)(1), unless the authorization is terminated  or revoked sooner.       Influenza A by PCR NEGATIVE NEGATIVE Final   Influenza B by PCR NEGATIVE NEGATIVE Final    Comment: (NOTE) The Xpert Xpress SARS-CoV-2/FLU/RSV plus assay is intended as an aid in the diagnosis of influenza from Nasopharyngeal swab specimens and should not be used as a sole basis for treatment. Nasal washings and aspirates are unacceptable for Xpert Xpress SARS-CoV-2/FLU/RSV testing.  Fact Sheet for Patients: EntrepreneurPulse.com.au  Fact Sheet for Healthcare Providers: IncredibleEmployment.be  This test is not yet approved or cleared by the [place] FDA and has been authorized for detection and/or diagnosis of SARS-CoV-2 by FDA under an Emergency Use Authorization (EUA). This EUA will  remain in effect (meaning this test can be used) for the duration of the COVID-19 declaration under Section 564(b)(1) of the Act, 21 U.S.C. section 360bbb-3(b)(1), unless the authorization is terminated or revoked.  Performed at [facility], [place] [address], [place], [place] [address]   Respiratory (~20 pathogens) panel by PCR     Status: Abnormal   Collection Time: 06/17/22  5:36 AM   Specimen: Nasopharyngeal Swab; Respiratory  Result Value Ref Range Status   Adenovirus NOT DETECTED NOT DETECTED Final   Coronavirus 229E NOT DETECTED NOT DETECTED Final    Comment: (NOTE) The Coronavirus on the Respiratory Panel, DOES NOT test for the novel  Coronavirus (2019 nCoV)    Coronavirus HKU1 NOT DETECTED NOT DETECTED Final   Coronavirus NL63 NOT DETECTED NOT DETECTED Final   Coronavirus OC43 NOT DETECTED NOT DETECTED Final   Metapneumovirus DETECTED  (A) NOT DETECTED Final   Rhinovirus / Enterovirus NOT DETECTED NOT DETECTED Final   Influenza A NOT DETECTED NOT DETECTED Final   Influenza B NOT DETECTED NOT DETECTED Final   Parainfluenza Virus 1 NOT DETECTED NOT DETECTED Final   Parainfluenza Virus 2 NOT DETECTED NOT DETECTED Final   Parainfluenza Virus 3 NOT DETECTED NOT DETECTED Final   Parainfluenza Virus 4 NOT DETECTED NOT DETECTED Final   Respiratory Syncytial Virus NOT DETECTED NOT DETECTED Final   Bordetella pertussis NOT DETECTED NOT DETECTED Final   Bordetella Parapertussis NOT DETECTED NOT DETECTED Final   Chlamydophila pneumoniae NOT DETECTED NOT DETECTED Final   Mycoplasma pneumoniae NOT DETECTED NOT DETECTED Final    Comment: Performed at [facility], [address], [place], [place] [address]         Radiology Studies: DG Chest Portable 1 View  Result Date: 06/16/2022 CLINICAL DATA:  Hypoxia EXAM: PORTABLE CHEST 1 VIEW COMPARISON:  Chest x-ray 12/05/2021 FINDINGS: The tip of the tracheostomy is at the level of the clavicular heads, unchanged. The heart is enlarged. There central pulmonary vascular congestion. There is some patchy opacities in the right mid lung and left lung base. There is no pleural effusion or pneumothorax. No acute fractures are seen. IMPRESSION: 1. Cardiomegaly with central pulmonary vascular congestion. 2. Patchy opacities in the right mid lung and left lung base, which may represent edema or infection. Electronically Signed   By: Ronney Asters M.D.   On: 06/16/2022 20:44        Scheduled Meds:  apixaban  5 mg Oral BID   atorvastatin  20 mg Oral Daily   ipratropium-albuterol  3 mL Nebulization Q6H   sodium chloride HYPERTONIC  4 mL Nebulization BID   spironolactone  25 mg Oral Daily   Continuous Infusions:  azithromycin (ZITHROMAX) 500 mg in sodium chloride 0.9 % 250 mL IVPB     cefTRIAXone (ROCEPHIN)  IV       LOS: 1 day       Phillips Climes, MD [facility]   To contact the attending provider between 7A-7P or the covering provider during after hours 7P-7A, please log into the web site [email or web address] and access using universal [place] password for that web site. If you do not have the password, please call the hospital operator.  06/17/2022, 4:24 PM

## 2022-06-17 NOTE — Evaluation (Signed)
Clinical/Bedside Swallow Evaluation Patient Details  Name: Angel Brewer MRN: [id number] Date of Birth: 1966-04-22  Today's Date: 06/17/2022 Time: SLP Start Time (ACUTE ONLY): 36 SLP Stop Time (ACUTE ONLY): 1635 SLP Time Calculation (min) (ACUTE ONLY): 20 min  Past Medical History:  Past Medical History:  Diagnosis Date   Chronic diastolic CHF (congestive heart failure) ([place]) 01/05/2022   DVT (deep vein thrombosis) in pregnancy    RLE DVT 02/2016   Dyspnea    Essential hypertension 08/25/2017   Hyperlipidemia 11/21/2021   Morbid obesity ([place])    Obesity hypoventilation syndrome ([place]) 05/03/2021   PE (pulmonary embolism) 02/29/2016   Sleep apnea    uses a cpap   Tracheostomy present ([place]) 01/05/2022   Size 6 shiley/cuffless  Last changed: 10/9     Discussion  Stable trach.  She does get a Bagnall more short of breath with exertion.  Although I did not observe it on my exam she does endorse she will occasionally have a rush of air after removing PMV following activity.  This could suggest some mild air trapping from the trach.  Otherwise she is doing fantastic with the trach     Plan  Cont routin   Past Surgical History:  Past Surgical History:  Procedure Laterality Date   CESAREAN SECTION     x 3   OPEN REDUCTION INTERNAL FIXATION (ORIF) DISTAL RADIAL FRACTURE Right 06/20/2019   Procedure: OPEN REDUCTION INTERNAL FIXATION (ORIF) DISTAL RADIAL FRACTURE;  Surgeon: [name], MD;  Location: [place] [facility];  Service: Orthopedics;  Laterality: Right;  REGIONAL BLOCK TOTAL SURGERY TIME: 90 MINUTES   SYNOVECTOMY Right 11/21/2019   Procedure: RIGHT WRIST LIGAMENT RECONSTRUCTION;  Surgeon: [name], MD;  Location: [place];  Service: Orthopedics;  Laterality: Right;  PROCEDURE: RIGHT WRIST LIGAMENT RECONSTRUCTION LENGTH OF SURGERY: 105 MIN   HPI:  Patient is a 56 y.o. female with PMH: h/o respiratory failure s/p tracheostomy placement on 11/29/21; wears PMV, followed in tracheostomy clinic  by [name], NP ([place]) CHF, DVT/PE, HLD, severe morbid obesity, O2 supplementatoin nightly as needed. She presented to the hospital on 06/16/22 with progressive dyspnea for past three days and associated persistent productive cough. In ED, she was severely  hypoxic  with O2 saturations low 70's  and improved with NRB. CXR revealed multifocal infiltrates suggestive of PNA, VBG revealing respiratory acidosis. She is being followed by PCCM for hypoxia and tracheostomy management.    Assessment / Plan / Recommendation  Clinical Impression  Patient is not currently presenting with clinical s/s of dysphagia as per this limited bedside swallow evaluation. (patient declining to have solids) During discussion with patient regarding her recent PO intake, she said the most recent thing she ate was a sandwich yesterday which she did not want but "forced myself to" to keep from getting weak. She did tell SLP that she wears her PMV all the time (PMV was donned on trach when SLP arrived) and SLP did review recommendations for her to take PMV off during strenuous exercise and when sleeping. Patient did not appear to listen/consider this. SLP was not able to get patient sitting fully upright and so she was partially reclined. She drank multiple straw sips of thin liquids (water) with timely swallow initiation and no overt s/s aspiration or penetration. Based on her past performance with PMV and PO's when in  hospital back in June of 2023, SLP suspects that she is at or near baseline with swallow function and recommending initiate regular solids,  thin liquids. SLP will f/u at least one time to ensure toleration. SLP Visit Diagnosis: Dysphagia, unspecified (R13.10)    Aspiration Risk  Mild aspiration risk    Diet Recommendation Regular;Thin liquid   Liquid Administration via: Straw;Cup Medication Administration: Whole meds with liquid Supervision: Patient able to self feed Compensations: Small sips/bites;Slow  rate;Other (Comment) (PMV on) Postural Changes: Seated upright at 90 degrees    Other  Recommendations Oral Care Recommendations: Oral care BID    Recommendations for follow up therapy are one component of a multi-disciplinary discharge planning process, led by the attending physician.  Recommendations may be updated based on patient status, additional functional criteria and insurance authorization.  Follow up Recommendations No SLP follow up      Assistance Recommended at Discharge    Functional Status Assessment Patient has had a recent decline in their functional status and demonstrates the ability to make significant improvements in function in a reasonable and predictable amount of time.  Frequency and Duration min 1 x/week  1 week       Prognosis Prognosis for Safe Diet Advancement: Good      Swallow Study   General Date of Onset: 06/16/22 HPI: Patient is a 56 y.o. female with PMH: h/o respiratory failure s/p tracheostomy placement on 11/29/21; wears PMV, followed in tracheostomy clinic by [name], NP [facility] PCCM) CHF, DVT/PE, HLD, severe morbid obesity, O2 supplementatoin nightly as needed. She presented to the hospital on 06/16/22 with progressive dyspnea for past three days and associated persistent productive cough. In ED, she was severely  hypoxic  with O2 saturations low 70's  and improved with NRB. CXR revealed multifocal infiltrates suggestive of PNA, VBG revealing respiratory acidosis. She is being followed by PCCM for hypoxia and tracheostomy management. Type of Study: Bedside Swallow Evaluation Previous Swallow Assessment: during previous hospitalization following trach placement (May/June of 2023) Diet Prior to this Study: NPO Temperature Spikes Noted: No Respiratory Status: Trach Trach Size and Type: Uncuffed;#6 History of Recent Intubation: No Behavior/Cognition: Lethargic/Drowsy;Alert;Cooperative Oral Cavity Assessment: Within Functional Limits Oral Care  Completed by SLP: No Oral Cavity - Dentition: Edentulous Self-Feeding Abilities: Needs assist;Needs set up Patient Positioning: Upright in bed;Partially reclined Baseline Vocal Quality: Normal Volitional Cough: Strong Volitional Swallow: Able to elicit    Oral/Motor/Sensory Function Overall Oral Motor/Sensory Function: Within functional limits   Ice Chips     Thin Liquid Thin Liquid: Within functional limits Presentation: Straw    Nectar Thick     Honey Thick     Puree Puree: Not tested Other Comments: patient declined at this time   Solid     Solid: Not tested Other Comments: patient declined at this time      Sonia Baller, MA, CCC-SLP Speech Therapy

## 2022-06-18 DIAGNOSIS — Z6841 Body Mass Index (BMI) 40.0 and over, adult: Secondary | ICD-10-CM

## 2022-06-18 DIAGNOSIS — I5033 Acute on chronic diastolic (congestive) heart failure: Secondary | ICD-10-CM

## 2022-06-18 DIAGNOSIS — J9690 Respiratory failure, unspecified, unspecified whether with hypoxia or hypercapnia: Secondary | ICD-10-CM | POA: Diagnosis not present

## 2022-06-18 DIAGNOSIS — J9601 Acute respiratory failure with hypoxia: Secondary | ICD-10-CM | POA: Diagnosis not present

## 2022-06-18 DIAGNOSIS — J189 Pneumonia, unspecified organism: Secondary | ICD-10-CM | POA: Diagnosis not present

## 2022-06-18 DIAGNOSIS — Z930 Tracheostomy status: Secondary | ICD-10-CM

## 2022-06-18 LAB — BASIC METABOLIC PANEL
Anion gap: 10 (ref 5–15)
BUN: 8 mg/dL (ref 6–20)
CO2: 30 mmol/L (ref 22–32)
Calcium: 8.6 mg/dL — ABNORMAL LOW (ref 8.9–10.3)
Chloride: 101 mmol/L (ref 98–111)
Creatinine, Ser: 0.88 mg/dL (ref 0.44–1.00)
GFR, Estimated: >60 mL/min (ref >60)
Glucose, Bld: 93 mg/dL (ref 70–99)
Potassium: 3.9 mmol/L (ref 3.5–5.1)
Sodium: 141 mmol/L (ref 135–145)

## 2022-06-18 LAB — CBC
HCT: 38.8 % (ref 36.0–46.0)
Hemoglobin: 11.9 g/dL — ABNORMAL LOW (ref 12.0–15.0)
MCH: 28.7 pg (ref 26.0–34.0)
MCHC: 30.7 g/dL (ref 30.0–36.0)
MCV: 93.7 fL (ref 80.0–100.0)
Platelets: 156 10*3/uL (ref 150–400)
RBC: 4.14 MIL/uL (ref 3.87–5.11)
RDW: 15 % (ref 11.5–15.5)
WBC: 4.6 10*3/uL (ref 4.0–10.5)
nRBC: 0.4 % — ABNORMAL HIGH (ref 0.0–0.2)

## 2022-06-18 LAB — EXPECTORATED SPUTUM ASSESSMENT W GRAM STAIN, RFLX TO RESP C: Special Requests: NORMAL

## 2022-06-18 MED ORDER — PHENOL 1.4 % MT LIQD
1.0000 | OROMUCOSAL | Status: DC | PRN
  Administered 2022-06-19: 1 via OROMUCOSAL
  Filled 2022-06-18: qty 177

## 2022-06-18 MED ORDER — ORAL CARE MOUTH RINSE: 15.0000 mL | OROMUCOSAL | Status: DC | PRN

## 2022-06-18 MED ORDER — ORAL CARE MOUTH RINSE
15.0000 mL | OROMUCOSAL | Status: DC
  Administered 2022-06-18 – 2022-06-23 (×12): 15 mL via OROMUCOSAL

## 2022-06-18 NOTE — Hospital Course (Addendum)
56 year old woman PMH severe morbid obesity, chronic respiratory failure status post tracheostomy 11/29/2021 presented with progressive shortness of breath.,  Severely hypoxic and admitted for further treatment.

## 2022-06-18 NOTE — TOC Initial Note (Signed)
Transition of Care [facility]) - Initial/Assessment Note    Patient Details  Name: Angel Brewer MRN: [id number] Date of Birth: 1965/12/07  [facility] CM/SW Contact:    Ninfa Meeker, RN Phone Number: 06/18/2022, 1:17 PM  Clinical Narrative:                  Patient is 56 year old lady  who presented to [facility] ED 12/4 for persistent SOB x 2-3 days with productive cough and increased secretions from trach.  Transition of Care Department [facility]) has reviewed patient and no TOC needs have been identified at this time. We will continue to monitor patient advancement through Interdisciplinary progressions. If new patient transition needs arise, please place a consult.      Patient Goals and CMS Choice        Expected Discharge Plan and Services                                                Prior Living Arrangements/Services                       Activities of Daily Living Home Assistive Devices/Equipment: Gilford Rile (specify type) ADL Screening (condition at time of admission) Patient's cognitive ability adequate to safely complete daily activities?: Yes Is the patient deaf or have difficulty hearing?: No Does the patient have difficulty seeing, even when wearing glasses/contacts?: No Does the patient have difficulty concentrating, remembering, or making decisions?: No Patient able to express need for assistance with ADLs?: Yes Does the patient have difficulty dressing or bathing?: No Independently performs ADLs?: Yes (appropriate for developmental age) Does the patient have difficulty walking or climbing stairs?: Yes Weakness of Legs: None Weakness of Arms/Hands: None  Permission Sought/Granted                  Emotional Assessment              Admission diagnosis:  CAP (community acquired pneumonia) [J18.9] Acute on chronic respiratory failure with hypoxia and hypercapnia ([place]) [H73.42, J96.22] Community acquired pneumonia of left  lower lobe of lung [J18.9] Patient Active Problem List   Diagnosis Date Noted   CAP (community acquired pneumonia) 06/16/2022   Respiratory failure ([place]) 01/08/2022   Chronic diastolic CHF (congestive heart failure) ([place]) 01/05/2022   Tracheostomy present ([place]) 01/05/2022   History of pulmonary embolus (PE) 11/21/2021   Hyperlipidemia 11/21/2021   COPD (chronic obstructive pulmonary disease) ([place]) 05/08/2021   Obesity hypoventilation syndrome ([place]) 05/03/2021   OSA (obstructive sleep apnea) 08/18/2020   Chronic deep vein thrombosis (DVT) of femoral vein of left lower extremity ([place]) 02/09/2020   Chronic anticoagulation 02/09/2020   Right wrist fracture 06/14/2019   Chronic respiratory failure ([place]) 06/13/2019   Essential hypertension 08/25/2017   Class 3 obesity ([place]) 10/29/2016   Vitamin D deficiency 10/29/2016   PCP:  [name], NP Pharmacy:   [id number] [place] [place] [address]Phone: [phone]: [phone] [facility] [address]Phone: [phone]: [phone]    Social Determinants of Health (SDOH) Interventions    Readmission Risk Interventions    12/20/2021   11:33 AM 12/02/2019    1:47 PM  Readmission Risk Prevention Plan  Post Dischage Appt  Complete  Medication Screening  Complete  Transportation Screening Complete Complete  PCP or Specialist Appt within 3-5 Days Complete   HRI or [place] Complete   Social Work Consult for [place] Planning/Counseling Complete   Palliative Care Screening Not Applicable   Medication Review Press photographer) Complete

## 2022-06-18 NOTE — Consult Note (Signed)
[facility] CM Inpatient Consult   06/18/2022  Angel Brewer 07/24/1965 [id number]  Managed Medicaid: [employer]  Primary Care Provider: [name], NP, [facility]  Patient was discussed in unit progression meeting.  Reviewed to check on post hospital care coordination needs for potential Managed Medicaid [MM] team.  Patient with ongoing medical management needs:  Trach with pneumonia currently on droplet precautions, labs pending.  Plan:  Following for readmission prevention needs for potential follow up with MM team post hospital.  Angel Brood, RN BSN [place]  [phone] business mobile phone Toll free office [phone]  *[place]  [phone] Fax number: [phone] [email or web address]

## 2022-06-18 NOTE — Progress Notes (Signed)
[name], MRN:  [id number], DOB:  01/29/1966, LOS: 2 ADMISSION DATE:  06/16/2022 CONSULTATION DATE:  06/17/2022 REFERRING MD:  [place] Crane - TRH CHIEF COMPLAINT:  Hypoxia, tracheostomy management   History of Present Illness:  56 year old woman who presented to [facility] ED 12/4 for persistent SOB x 2-3 days with productive cough and increased secretions from trach. PMHx significant for HTN, HLD, chronic diastolic CHF (Echo 10/9177 with EF 60-65%, mild LVH), OHS/OSA s/p tracheostomy 11/2021 (on nocturnal O2 PRN, followed by [name], NP), DVT/PE (on Eliquis).  On ED arrival, patient reported 3-day history of SOB and cough worsened from baseline; she also reported orthopnea. Denies fever/chills, CP, significant edema, known sick contacts. Vitals in ED were notable for hypoxia with SpO2 92% on 15L NRB, mild tachycardia to low 100s, otherwise WNL. Labs with normal CBC, BMP grossly WNL with mild Cr elevation (baseline), BNP/trop WNL, VBG with minimal hypercarbia. COVID/flu negative. LA pending. CXR showed cardiomegaly and patchy opacities of R mid-lung and L lung base. Medicine called for admission.  While awaiting inpatient bed, patient had an episode of desaturation to 80s on 15L with associated increased secretions. Hypertonic saline neb and Mucomyst were administered and patient was suctioned. Resp Cx/TA was ordered and CAP coverage initiated.  PCCM consulted for assistance with hypoxia and tracheostomy/secretion management.  Pertinent Medical History:   Past Medical History:  Diagnosis Date   Chronic diastolic CHF (congestive heart failure) ([place]) 01/05/2022   DVT (deep vein thrombosis) in pregnancy    RLE DVT 02/2016   Dyspnea    Essential hypertension 08/25/2017   Hyperlipidemia 11/21/2021   Morbid obesity ([place])    Obesity hypoventilation syndrome ([place]) 05/03/2021   PE (pulmonary embolism) 02/29/2016   Sleep apnea    uses a cpap   Tracheostomy present ([place]) 01/05/2022   Size 6  shiley/cuffless  Last changed: 10/9     Discussion  Stable trach.  She does get a Lutzke more short of breath with exertion.  Although I did not observe it on my exam she does endorse she will occasionally have a rush of air after removing PMV following activity.  This could suggest some mild air trapping from the trach.  Otherwise she is doing fantastic with the [facility] Events: Including procedures, antibiotic start and stop dates in addition to other pertinent events   12/5 - PCCM consulted for assistance with management of hypoxia and increased secretions in the setting of tracheostomy  Interim History / Subjective:  06/18/2022 no acute distress will follow weekly basis Objective:  Blood pressure (!) 142/72, pulse 71, temperature 98.5 F (36.9 C), temperature source Oral, resp. rate (!) 22, height _0  (1.676 m), weight (!) 175.5 kg, last menstrual period 11/29/2015, SpO2 96 %.    FiO2 (%):  [50 %-60 %] 60 %   Intake/Output Summary (Last 24 hours) at 06/18/2022 0809 Last data filed at 06/18/2022 0600 Gross per 24 hour  Intake 1106.54 ml  Output 1550 ml  Net -443.46 ml   Filed Weights   06/16/22 1951 06/17/22 2149 06/18/22 0555  Weight: (!) 176.9 kg (!) 174.6 kg (!) 175.5 kg   Physical Examination: General: Obese female HEENT: MM pink/moist #6 cuffless trach in place with Passy-Muir valve in place speech is clear yellow sputum is noted Neuro: Grossly intact without focal defect CV: Heart sounds are regular PULM: Decreased breath sounds in the bases  soft, bsx4 active  GU:  Extremities: warm/dry, 1+ edema  Skin: no rashes or lesions   Resolved Hospital Problem List:    Assessment & Plan:   Angel Brewer is seen in consultation at the request of Dr. [place] Crane [facility]) for recommendations on management of hypoxia and increased secretions in the setting of tracheostomy.  56 year old woman who presented to [facility] ED 12/4 for persistent SOB x 2-3 days  with productive cough and increased secretions from trach. History of OHS/OSA requiring initial tracheostomy creation 11/2021. Baseline requirement of nocturnal O2 PRN. Patient is followed in tracheostomy clinic by [name], NP [facility] PCCM).  CAP Tracheostomy, 11/2021 OSA/OHS Minimize deep suctioning Continue antimicrobial therapy O2 to keep sats greater than 88% Continue pulmonary toilet No need for trach change or cuffed trach at this time Diuresis as able Pulmonary will see as a weekly basis         PCCM will continue to followon weekly basis.  Best Practice: (right click and "Reselect all SmartList Selections" daily)   Per Primary Team  Labs:  CBC: Recent Labs  Lab 06/16/22 2005 06/16/22 2114 06/17/22 0430 06/18/22 0048  WBC 5.1  --  5.5 4.6  NEUTROABS 3.1  --   --   --   HGB 12.6 13.3 12.5 11.9*  HCT 39.8 39.0 41.5 38.8  MCV 90.2  --  94.1 93.7  PLT 185  --  185 161   Basic Metabolic Panel: Recent Labs  Lab 06/16/22 2005 06/16/22 2114 06/17/22 0430 06/18/22 0048  NA 137 140 140 141  K 3.4* 3.9 4.8 3.9  CL 103  --  103 101  CO2 25  --  29 30  GLUCOSE 110*  --  108* 93  BUN 21*  --  18 8  CREATININE 1.48*  --  1.19* 0.88  CALCIUM 8.7*  --  8.9 8.6*  MG  --   --  2.2  --   PHOS  --   --  4.0  --    GFR: Estimated Creatinine Clearance: 119.2 mL/min (by C-G formula based on SCr of 0.88 mg/dL). Recent Labs  Lab 06/16/22 2005 06/17/22 0430 06/17/22 1501 06/17/22 2219 06/18/22 0048  PROCALCITON  --  <0.10  --   --   --   WBC 5.1 5.5  --   --  4.6  LATICACIDVEN  --   --  0.8 1.3  --    Liver Function Tests: No results for input(s): "AST", "ALT", "ALKPHOS", "BILITOT", "PROT", "ALBUMIN" in the last 168 hours. No results for input(s): "LIPASE", "AMYLASE" in the last 168 hours. No results for input(s): "AMMONIA" in the last 168 hours.  ABG:    Component Value Date/Time   PHART 7.458 (H) 11/25/2021 0353   PCO2ART 59.3 (H) 11/25/2021 0353    PO2ART 79 (L) 11/25/2021 0353   HCO3 30.3 (H) 06/16/2022 2114   TCO2 32 06/16/2022 2114   ACIDBASEDEF 4.0 (H) 05/01/2021 0158   O2SAT 87 06/16/2022 2114    Coagulation Profile: No results for input(s): "INR", "PROTIME" in the last 168 hours.  Cardiac Enzymes: No results for input(s): "CKTOTAL", "CKMB", "CKMBINDEX", "TROPONINI" in the last 168 hours.  HbA1C: Hemoglobin A1C  Date/Time Value Ref Range Status  06/10/2016 04:41 PM 5.4  Final   CBG: No results for input(s): "GLUCAP" in the last 168 hours.   Critical care time: N/A  \Steve Keylie Beavers ACNP Acute Care Nurse Practitioner [place] Please consult Amion 06/18/2022, 8:09 AM

## 2022-06-18 NOTE — Progress Notes (Signed)
  Progress Note   Patient: Angel Brewer PTE:[id number] DOB: 04/02/1966 DOA: 06/16/2022     2 DOS: the patient was seen and examined on 06/18/2022   Brief hospital course: 56 year old woman PMH severe morbid obesity, chronic respiratory failure status post tracheostomy 11/29/2021 presented with progressive shortness of breath.,  Severely hypoxic and admitted for further treatment.  Assessment and Plan: Multifocal community-acquired pneumonia, POA Tracheostomy OSA/OHS Clinically improved.  Continue antibiotics.   Tracheostomy management as per pulmonology.   Acute on chronic diastolic CHF  Improved.  Changed to oral Lasix tomorrow.  Continue Aldactone and Farxiga.-   Essential hypertension Continue Norvasc    PMH pulmonary embolism/DVT on Eliquis Continue apixaban   Severe morbid obesity BMI 62 Recommend weight loss outpatient with regular physical activity and healthy dieting.      Subjective:  Feels better Breathing better  Physical Exam: Vitals:   06/18/22 1128 06/18/22 1506 06/18/22 1650 06/18/22 1957  BP: 119/69 120/60 (!) 141/84 134/75  Pulse: 65  72 83  Resp:   16 (!) 22  Temp:   98.7 F (37.1 C)   TempSrc:   Oral   SpO2:  96% 96% 98%  Weight:      Height:       Physical Exam Vitals reviewed.  Constitutional:      General: She is not in acute distress.    Appearance: She is not ill-appearing or toxic-appearing.  Cardiovascular:     Rate and Rhythm: Normal rate and regular rhythm.     Heart sounds: No murmur heard. Pulmonary:     Effort: Pulmonary effort is normal. No respiratory distress.     Breath sounds: No wheezing, rhonchi or rales.  Neurological:     Mental Status: She is alert.  Psychiatric:        Mood and Affect: Mood normal.        Behavior: Behavior normal.     Data Reviewed: BMP noted CBC noted  Family Communication: none  Disposition: Status is: Inpatient Remains inpatient appropriate because: SOB  Planned Discharge  Destination: Home    Time spent: 20 minutes  Author: Murray Hodgkins, MD 06/18/2022 8:03 PM  For on call review [email or web address].

## 2022-06-18 NOTE — Progress Notes (Signed)
Speech Language Pathology Treatment: Dysphagia  Patient Details Name: Angel Brewer MRN: [id number] DOB: [birth year] Today's Date: 06/18/2022 Time: 1400-1410 SLP Time Calculation (min) (ACUTE ONLY): 10 min  Assessment / Plan / Recommendation Clinical Impression  Pt was seen for skilled ST intervention targeting goal for diet tolerance and adherence to safe swallow precautions. RN reports decreased intake of solid foods, but good tolerance of liquids. Pt was awake and alert upon arrival of SLP. PMSV in place. Pt reported she is not hungry and declined trials from lunch tray. She did accept thin liquids via straw, and consumed several boluses without overt s/s aspiration. SLP provided re-education regarding when to remove PMSV - when sleeping and during breathing treatments. ST will sign off at this time. Please reconsult if needs arise.   HPI HPI: Patient is a 56 y.o. female with PMH: h/o respiratory failure s/p tracheostomy placement on 11/29/21; wears PMV, followed in tracheostomy clinic by [name], NP [facility] PCCM) CHF, DVT/PE, HLD, severe morbid obesity, O2 supplementatoin nightly as needed. She presented to the hospital on 06/16/22 with progressive dyspnea for past three days and associated persistent productive cough. In ED, she was severely  hypoxic  with O2 saturations low 70's  and improved with NRB. CXR revealed multifocal infiltrates suggestive of PNA, VBG revealing respiratory acidosis. She is being followed by PCCM for hypoxia and tracheostomy management.      SLP Plan  Discharge SLP treatment due to goals met      Recommendations for follow up therapy are one component of a multi-disciplinary discharge planning process, led by the attending physician.  Recommendations may be updated based on patient status, additional functional criteria and insurance authorization.    Recommendations  Diet recommendations: Regular;Thin liquid Liquids provided via: Cup;Straw Medication  Administration: Whole meds with liquid Supervision: Patient able to self feed;Intermittent supervision to cue for compensatory strategies Compensations: Small sips/bites;Slow rate;Other (Comment) Postural Changes and/or Swallow Maneuvers: Seated upright 90 degrees                Oral Care Recommendations: Oral care BID Follow Up Recommendations: No SLP follow up Assistance recommended at discharge: Frequent or constant Supervision/Assistance SLP Visit Diagnosis: Dysphagia, unspecified (R13.10) Plan: Discharge SLP treatment due to goals met          Jolan Mealor B. Quentin Ore, [facility], [place] Speech Language Pathologist Office: [phone]  Shonna Chock 06/18/2022, 2:13 PM

## 2022-06-19 DIAGNOSIS — J189 Pneumonia, unspecified organism: Secondary | ICD-10-CM | POA: Diagnosis not present

## 2022-06-19 DIAGNOSIS — Z6841 Body Mass Index (BMI) 40.0 and over, adult: Secondary | ICD-10-CM | POA: Diagnosis not present

## 2022-06-19 DIAGNOSIS — I5033 Acute on chronic diastolic (congestive) heart failure: Secondary | ICD-10-CM | POA: Diagnosis not present

## 2022-06-19 DIAGNOSIS — Z930 Tracheostomy status: Secondary | ICD-10-CM | POA: Diagnosis not present

## 2022-06-19 MED ORDER — IPRATROPIUM-ALBUTEROL 0.5-2.5 (3) MG/3ML IN SOLN
3.0000 mL | Freq: Two times a day (BID) | RESPIRATORY_TRACT | Status: DC
  Administered 2022-06-19 – 2022-06-20 (×2): 3 mL via RESPIRATORY_TRACT
  Filled 2022-06-19 (×3): qty 3

## 2022-06-19 MED ORDER — AZITHROMYCIN 250 MG PO TABS
500.0000 mg | ORAL_TABLET | Freq: Every day | ORAL | Status: DC
  Administered 2022-06-19: 500 mg via ORAL
  Filled 2022-06-19: qty 2

## 2022-06-19 MED ORDER — FUROSEMIDE 20 MG PO TABS
20.0000 mg | ORAL_TABLET | Freq: Every day | ORAL | Status: DC
  Administered 2022-06-20 – 2022-06-24 (×5): 20 mg via ORAL
  Filled 2022-06-19 (×5): qty 1

## 2022-06-19 NOTE — Progress Notes (Signed)
06/19/22 1014  Mobility  Activity Ambulated with assistance in room  Level of Assistance Minimal assist, patient does 75% or more  Assistive Device Front wheel walker  Distance Ambulated (ft) 40 ft  Activity Response Tolerated well  Mobility Referral Yes  $Mobility charge 1 Mobility   Mobility Specialist Progress Note  During Mobility: 89% SpO2(8L) Post-Mobility: 91% SpO2(8L)  Pt was in bed and agreeable. Had c/o leg cramps during ambulation. Returned to bed w/ all needs met and call bell in reach.   Lucious Groves Mobility Specialist  Please contact via SecureChat or Rehab office at [phone]

## 2022-06-19 NOTE — Progress Notes (Signed)
PHARMACIST - PHYSICIAN COMMUNICATION DR:   Sarajane Jews and colleagues CONCERNING: Antibiotic IV to Oral Route Change Policy  RECOMMENDATION: This patient is receiving Azithromycin by the intravenous route.  Based on criteria approved by the Pharmacy and Therapeutics Committee, the antibiotic(s) is/are being converted to the equivalent oral dose form(s).   DESCRIPTION: These criteria include: Patient being treated for a respiratory tract infection, urinary tract infection, cellulitis or clostridium difficile associated diarrhea if on metronidazole The patient is not neutropenic and does not exhibit a GI malabsorption state The patient is eating (either orally or via tube) and/or has been taking other orally administered medications for a least 24 hours The patient is improving clinically and has a Tmax < 100.5  If you have questions about this conversion, please contact the Pharmacy Department  _0   [phone] )  [facility] _1   [phone] )  [facility] _2   [phone] )  [facility] _3   [phone] )  [facility] _4   [phone] )  Rives, PharmD, BCPS Please see amion for complete clinical pharmacist phone list 06/19/2022 8:05 AM

## 2022-06-19 NOTE — Progress Notes (Signed)
  Progress Note   Patient: Angel Brewer [id number]:[id number] DOB: [birth year] DOA: 06/16/2022     3 DOS: the patient was seen and examined on 06/19/2022   Brief hospital course: 56 year old woman PMH severe morbid obesity, chronic respiratory failure status post tracheostomy 11/29/2021 presented with progressive shortness of breath.,  Severely hypoxic and admitted for further treatment.  Assessment and Plan: Multifocal community-acquired pneumonia, POA Tracheostomy OSA/OHS A [place] worse today, coughing, short of breath, hypoxic now on nasal cannula.  Biotics and wean oxygen as tolerated. Tracheostomy management as per pulmonology.   Acute on chronic diastolic CHF  Improved.  Changed to oral Lasix 12/8.  Continue Aldactone and Farxiga.-   Essential hypertension Continue Norvasc    PMH pulmonary embolism/DVT on Eliquis Continue apixaban   Severe morbid obesity BMI 62 Recommend weight loss outpatient with regular physical activity and healthy dieting.  Subjective:  Feels ok now But SOB Per RN she got quite short of breath and Evoxac after getting up earlier today coughing spell.  Had to be placed on nasal cannula.  Physical Exam: Vitals:   06/19/22 0027 06/19/22 0300 06/19/22 0457 06/19/22 0854  BP: 133/72 133/72 (!) 150/87   Pulse: 75 85 62 60  Resp: _0 Temp: 97.8 F (36.6 C)  98.7 F (37.1 C)   TempSrc: Oral  Oral   SpO2: 95% 98% 93% 93%  Weight:   (!) 173.3 kg   Height:       Physical Exam Vitals and nursing note reviewed.  Constitutional:      General: She is not in acute distress.    Appearance: She is not ill-appearing or toxic-appearing.  Cardiovascular:     Rate and Rhythm: Normal rate and regular rhythm.     Heart sounds: No murmur heard. Pulmonary:     Effort: No respiratory distress.     Breath sounds: No wheezing, rhonchi or rales.     Comments: Coughing. Mildly increased respiratory effort. Neurological:     Mental Status: She is alert.   Psychiatric:        Mood and Affect: Mood normal.        Behavior: Behavior normal.     Data Reviewed: No new data  Family Communication: none  Disposition: Status is: Inpatient Remains inpatient appropriate because: pneumonia, respiratory failure  Planned Discharge Destination: Home    Time spent: 20 minutes  Author: Murray Hodgkins, MD 06/19/2022 9:11 AM  For on call review [email or web address].

## 2022-06-20 ENCOUNTER — Inpatient Hospital Stay (HOSPITAL_COMMUNITY): Payer: Medicaid Other

## 2022-06-20 DIAGNOSIS — E662 Morbid (severe) obesity with alveolar hypoventilation: Secondary | ICD-10-CM | POA: Diagnosis not present

## 2022-06-20 DIAGNOSIS — J9622 Acute and chronic respiratory failure with hypercapnia: Secondary | ICD-10-CM

## 2022-06-20 DIAGNOSIS — R918 Other nonspecific abnormal finding of lung field: Secondary | ICD-10-CM | POA: Diagnosis not present

## 2022-06-20 DIAGNOSIS — J9621 Acute and chronic respiratory failure with hypoxia: Secondary | ICD-10-CM

## 2022-06-20 DIAGNOSIS — Z930 Tracheostomy status: Secondary | ICD-10-CM | POA: Diagnosis not present

## 2022-06-20 DIAGNOSIS — I5033 Acute on chronic diastolic (congestive) heart failure: Secondary | ICD-10-CM | POA: Diagnosis not present

## 2022-06-20 DIAGNOSIS — J984 Other disorders of lung: Secondary | ICD-10-CM | POA: Diagnosis not present

## 2022-06-20 DIAGNOSIS — G4733 Obstructive sleep apnea (adult) (pediatric): Secondary | ICD-10-CM | POA: Diagnosis not present

## 2022-06-20 DIAGNOSIS — Z6841 Body Mass Index (BMI) 40.0 and over, adult: Secondary | ICD-10-CM | POA: Diagnosis not present

## 2022-06-20 DIAGNOSIS — J189 Pneumonia, unspecified organism: Secondary | ICD-10-CM | POA: Diagnosis not present

## 2022-06-20 LAB — CULTURE, RESPIRATORY W GRAM STAIN: Special Requests: NORMAL

## 2022-06-20 MED ORDER — AZITHROMYCIN 250 MG PO TABS
500.0000 mg | ORAL_TABLET | Freq: Every day | ORAL | Status: AC
  Administered 2022-06-20 – 2022-06-21 (×2): 500 mg via ORAL
  Filled 2022-06-20 (×2): qty 2

## 2022-06-20 MED ORDER — SODIUM CHLORIDE 0.9 % IV SOLN: 2.0000 g | Freq: Three times a day (TID) | INTRAVENOUS | Status: DC

## 2022-06-20 MED ORDER — SODIUM CHLORIDE 0.9 % IV SOLN
2.0000 g | INTRAVENOUS | Status: AC
  Administered 2022-06-20 – 2022-06-21 (×2): 2 g via INTRAVENOUS
  Filled 2022-06-20 (×2): qty 20

## 2022-06-20 MED ORDER — SODIUM CHLORIDE 0.9 % IV SOLN
2.0000 g | INTRAVENOUS | Status: DC
  Filled 2022-06-20: qty 12.5

## 2022-06-20 NOTE — Progress Notes (Signed)
  Progress Note   Patient: Angel Brewer [id number]:[id number] DOB: Nov 15, 1965 DOA: 06/16/2022     4 DOS: the patient was seen and examined on 06/20/2022   Brief hospital course: 56 year old woman PMH severe morbid obesity, chronic respiratory failure status post tracheostomy 11/29/2021 presented with progressive shortness of breath.,  Severely hypoxic and admitted for further treatment.  Assessment and Plan: Multifocal community-acquired pneumonia, POA Tracheostomy OSA/OHS A [place] worse today, coughing, short of breath, hypoxic now on nasal cannula, worse today.  Tracheostomy management as per pulmonology. 12/5 sputum: rare Pseudomonas.  Antibiotics discussed with pharmacy. Given clinical worsening with hypoxia, CT checked, scattered airspace disease.  Air trapping. Continue current management   Acute on chronic diastolic CHF  Improved.  Changed to oral Lasix 12/8.  Continue Aldactone and Farxiga.   Essential hypertension Continue Norvasc    PMH pulmonary embolism/DVT on Eliquis Continue apixaban   Severe morbid obesity BMI 62 Recommend weight loss outpatient with regular physical activity and healthy dieting.       Subjective:  Coughing Poor appetite  Physical Exam: Vitals:   06/20/22 1133 06/20/22 1200 06/20/22 1501 06/20/22 1620  BP: 132/85 132/85  126/69  Pulse: 72 (!) 54 75 (!) 50  Resp: 20 17 (!) 21 19  Temp: 98.4 F (36.9 C)   98.4 F (36.9 C)  TempSrc: Oral   Oral  SpO2: 95% 94% 94% 92%  Weight:      Height:       Physical Exam Vitals reviewed.  Constitutional:      General: She is not in acute distress.    Appearance: She is ill-appearing. She is not toxic-appearing.  Cardiovascular:     Rate and Rhythm: Normal rate and regular rhythm.     Heart sounds: No murmur heard. Pulmonary:     Effort: No respiratory distress.     Breath sounds: No wheezing, rhonchi or rales.     Comments: Coarse breath sounds, mild increased respiratory illness Musculoskeletal:      Right lower leg: No edema.     Left lower leg: No edema.  Neurological:     Mental Status: She is alert.  Psychiatric:        Mood and Affect: Mood normal.        Behavior: Behavior normal.     Data Reviewed: 12/5 sputum: rare pseudomonas    Family Communication: none  Disposition: Status is: Inpatient Remains inpatient appropriate because: SOB, hypoxic  Planned Discharge Destination: Home    Time spent: 35 minutes  Author: Murray Hodgkins, MD 06/20/2022 5:08 PM  For on call review [email or web address].

## 2022-06-20 NOTE — Progress Notes (Signed)
Pharmacy Antibiotic Note  Angel Brewer is a 56 y.o. female admitted on 06/16/2022 with pneumonia. Pt with chronic trach. Pharmacy has been consulted for Cefepime dosing. Pt is on D#4 of Rocephin and Azithromycin but now sputum with rare pseudomonas.  Plan: Cefepime 2gm IV q8h Will f/u renal function, micro data, and pt's clinical condition   Height: 5' 6" (167.6 cm) Weight: (!) 171.7 kg (378 lb 8.5 oz) IBW/kg (Calculated) : 59.3  Temp (24hrs), Avg:98.5 F (36.9 C), Min:98.2 F (36.8 C), Max:98.8 F (37.1 C)  Recent Labs  Lab 06/16/22 2005 06/17/22 0430 06/17/22 1501 06/17/22 2219 06/18/22 0048  WBC 5.1 5.5  --   --  4.6  CREATININE 1.48* 1.19*  --   --  0.88  LATICACIDVEN  --   --  0.8 1.3  --     Estimated Creatinine Clearance: 117.5 mL/min (by C-G formula based on SCr of 0.88 mg/dL).    No Known Allergies  Antimicrobials this admission: Cefepime 12/8>> CTX 12/5>12/8 Azithro 12/5>12/9  Microbiology results: 12/5 sputum: rare pseudomonas   Thank you for allowing pharmacy to be a part of this patient's care.  Sherlon Handing, PharmD, BCPS Please see amion for complete clinical pharmacist phone list 06/20/2022 8:22 AM

## 2022-06-21 DIAGNOSIS — Z930 Tracheostomy status: Secondary | ICD-10-CM | POA: Diagnosis not present

## 2022-06-21 DIAGNOSIS — J9621 Acute and chronic respiratory failure with hypoxia: Secondary | ICD-10-CM | POA: Diagnosis not present

## 2022-06-21 DIAGNOSIS — G4733 Obstructive sleep apnea (adult) (pediatric): Secondary | ICD-10-CM | POA: Diagnosis not present

## 2022-06-21 DIAGNOSIS — J9622 Acute and chronic respiratory failure with hypercapnia: Secondary | ICD-10-CM | POA: Diagnosis not present

## 2022-06-21 DIAGNOSIS — J189 Pneumonia, unspecified organism: Secondary | ICD-10-CM | POA: Diagnosis not present

## 2022-06-21 DIAGNOSIS — I5033 Acute on chronic diastolic (congestive) heart failure: Secondary | ICD-10-CM | POA: Diagnosis not present

## 2022-06-21 DIAGNOSIS — E662 Morbid (severe) obesity with alveolar hypoventilation: Secondary | ICD-10-CM | POA: Diagnosis not present

## 2022-06-21 DIAGNOSIS — Z6841 Body Mass Index (BMI) 40.0 and over, adult: Secondary | ICD-10-CM | POA: Diagnosis not present

## 2022-06-21 NOTE — Progress Notes (Signed)
  Progress Note   Patient: Angel Brewer PQD:[id number] DOB: 03-13-1966 DOA: 06/16/2022     5 DOS: the patient was seen and examined on 06/21/2022   Brief hospital course: 56 year old woman PMH severe morbid obesity, chronic respiratory failure status post tracheostomy 11/29/2021 presented with progressive shortness of breath.,  Severely hypoxic and admitted for further treatment.  Assessment and Plan: Multifocal community-acquired pneumonia, POA Tracheostomy OSA/OHS Better today with less cough and hypoxia.  Tracheostomy management as per pulmonology. 12/5 sputum: rare Pseudomonas.  Antibiotics discussed with pharmacy. Follow-up CT checked, scattered airspace disease.  Air trapping. Years to be turning the corner.  Continue antibiotics, wean oxygen as tolerated.   Acute on chronic diastolic CHF  Acute component appears resolved.  Continue oral Lasix.  Continue Aldactone and Farxiga.   Essential hypertension Stable.  Continue Norvasc    PMH pulmonary embolism/DVT on Eliquis Continue apixaban   Severe morbid obesity BMI 62 Recommend weight loss outpatient with regular physical activity and healthy dieting.       Subjective:  Feels a [place] better Coughing less  Physical Exam: Vitals:   06/21/22 1117 06/21/22 1522 06/21/22 1534 06/21/22 1543  BP:    (!) 162/100  Pulse: 83 88 83   Resp: (!) 22 19 (!) 23   Temp:   98.3 F (36.8 C)   TempSrc:   Oral   SpO2: 93%   91%  Weight:      Height:       Physical Exam Vitals reviewed.  Constitutional:      General: She is not in acute distress.    Appearance: She is not ill-appearing or toxic-appearing.  Cardiovascular:     Rate and Rhythm: Normal rate and regular rhythm.     Heart sounds: No murmur heard. Pulmonary:     Effort: Pulmonary effort is normal.     Breath sounds: No wheezing, rhonchi or rales.     Comments: Some cough. Neurological:     Mental Status: She is alert.  Psychiatric:        Mood and Affect: Mood  normal.        Behavior: Behavior normal.     Data Reviewed: There are no new results to review at this time.  Family Communication: none  Disposition: Status is: Inpatient Remains inpatient appropriate because: pneumonia, hypoxia  Planned Discharge Destination: Home    Time spent: 25 minutes  Author: Murray Hodgkins, MD 06/21/2022 4:18 PM  For on call review [email or web address].

## 2022-06-21 NOTE — Progress Notes (Signed)
Pt's BP went up to 162/100 at 15:45 pm. Notified MD  Tried to wean O2 to RA but O2 sat went down to 86%. Had to put 2.5 L via N/C to bring O2 sat up to 91-93%

## 2022-06-22 DIAGNOSIS — J9622 Acute and chronic respiratory failure with hypercapnia: Secondary | ICD-10-CM | POA: Diagnosis not present

## 2022-06-22 DIAGNOSIS — G4733 Obstructive sleep apnea (adult) (pediatric): Secondary | ICD-10-CM | POA: Diagnosis not present

## 2022-06-22 DIAGNOSIS — Z930 Tracheostomy status: Secondary | ICD-10-CM | POA: Diagnosis not present

## 2022-06-22 DIAGNOSIS — E662 Morbid (severe) obesity with alveolar hypoventilation: Secondary | ICD-10-CM

## 2022-06-22 DIAGNOSIS — J189 Pneumonia, unspecified organism: Secondary | ICD-10-CM | POA: Diagnosis not present

## 2022-06-22 DIAGNOSIS — J9621 Acute and chronic respiratory failure with hypoxia: Secondary | ICD-10-CM | POA: Diagnosis not present

## 2022-06-22 DIAGNOSIS — I5033 Acute on chronic diastolic (congestive) heart failure: Secondary | ICD-10-CM | POA: Diagnosis not present

## 2022-06-22 DIAGNOSIS — Z6841 Body Mass Index (BMI) 40.0 and over, adult: Secondary | ICD-10-CM | POA: Diagnosis not present

## 2022-06-22 LAB — BASIC METABOLIC PANEL
Anion gap: 12 (ref 5–15)
BUN: 18 mg/dL (ref 6–20)
CO2: 35 mmol/L — ABNORMAL HIGH (ref 22–32)
Calcium: 9.2 mg/dL (ref 8.9–10.3)
Chloride: 93 mmol/L — ABNORMAL LOW (ref 98–111)
Creatinine, Ser: 0.95 mg/dL (ref 0.44–1.00)
GFR, Estimated: >60 mL/min (ref >60)
Glucose, Bld: 93 mg/dL (ref 70–99)
Potassium: 3.3 mmol/L — ABNORMAL LOW (ref 3.5–5.1)
Sodium: 140 mmol/L (ref 135–145)

## 2022-06-22 LAB — CBC
HCT: 39 % (ref 36.0–46.0)
Hemoglobin: 11.8 g/dL — ABNORMAL LOW (ref 12.0–15.0)
MCH: 28 pg (ref 26.0–34.0)
MCHC: 30.3 g/dL (ref 30.0–36.0)
MCV: 92.6 fL (ref 80.0–100.0)
Platelets: 223 10*3/uL (ref 150–400)
RBC: 4.21 MIL/uL (ref 3.87–5.11)
RDW: 14.5 % (ref 11.5–15.5)
WBC: 4.3 10*3/uL (ref 4.0–10.5)
nRBC: 0 % (ref 0.0–0.2)

## 2022-06-22 MED ORDER — POTASSIUM CHLORIDE CRYS ER 20 MEQ PO TBCR
40.0000 meq | EXTENDED_RELEASE_TABLET | Freq: Once | ORAL | Status: AC
  Administered 2022-06-22: 40 meq via ORAL
  Filled 2022-06-22: qty 2

## 2022-06-22 NOTE — Progress Notes (Signed)
RT NOTE:  Pt refuses to be suctioned at this time. Pt also refuses to wear ATC, she states it dries her out. Pt is wearing [place] but desats while sleeping. RN and RT have explained to pt that she needs ATC but she continues to refuse.

## 2022-06-22 NOTE — Progress Notes (Signed)
Pt O2 saturations dropping into the mid 70s and low 80s when patient falls asleep. When I wake her they go back into the low 90s. Pt states this is normal for her. Pt states she is unable to tolerate the trach collar as it makes her cough and she is unable to sleep. Denies needing to be suctioned or trach inner canula changed. MD and RT notified. Raised O2 to 5L per RT suggestion.

## 2022-06-22 NOTE — Plan of Care (Signed)

## 2022-06-22 NOTE — Progress Notes (Signed)
  Progress Note   Patient: Angel Brewer APO:[id number] DOB: [birth year] DOA: 06/16/2022     6 DOS: the patient was seen and examined on 06/22/2022   Brief hospital course: 56 year old woman PMH severe morbid obesity, chronic respiratory failure status post tracheostomy 11/29/2021 presented with progressive shortness of breath.,  Severely hypoxic and admitted for further treatment.  Assessment and Plan: Multifocal community-acquired pneumonia, POA Tracheostomy OSA/OHS Continue to improve.  Did have some sleep apnea last night.  Tracheostomy management as per pulmonology. 12/5 sputum: rare Pseudomonas.  Antibiotics discussed with pharmacy. Completed treatment Follow-up CT checked, scattered airspace disease.  Air trapping. Improving.  Wean oxygen as tolerated.   Acute on chronic diastolic CHF  Acute component appears resolved.  Continue oral Lasix. Continue Aldactone and Farxiga.   Essential hypertension Remains stable.  Continue Norvasc    PMH pulmonary embolism/DVT on Eliquis Continue apixaban   Severe morbid obesity Body mass index is 58.11 kg/m. Recommend weight loss outpatient with regular physical activity and healthy dieting.  Hypokalemia.  Replete.    Subjective:  RN notes Tried to wean O2 to RA but O2 sat went down to 86%. Had to put 2.5 L via N/C to bring O2 sat up to 91-93%  Pt O2 saturations dropping into the mid 70s and low 80s when patient falls asleep. When I wake her they go back into the low 90s. Pt states this is normal for her. Pt states she is unable to tolerate the trach collar as it makes her cough and she is unable to sleep. Denies needing to be suctioned or trach inner canula changed. MD and RT notified. Raised O2 to 5L per RT suggestion.  Feels ok today. Coughs. Breathing ok.  Physical Exam: Vitals:   06/21/22 1913 06/22/22 0015 06/22/22 0449 06/22/22 0750  BP: (!) 143/91  135/73   Pulse: 94 (!) 58 83 81  Resp: _0 (!) 23  Temp: 98.3 F (36.8  C)  98.3 F (36.8 C)   TempSrc: Oral  Oral   SpO2: (!) 85% 93% 93% 95%  Weight:   (!) 163.3 kg   Height:       Physical Exam Vitals and nursing note reviewed.  Constitutional:      General: She is not in acute distress.    Appearance: She is not ill-appearing or toxic-appearing.     Comments: Sitting in chair  Cardiovascular:     Rate and Rhythm: Normal rate and regular rhythm.     Heart sounds: No murmur heard. Pulmonary:     Effort: Pulmonary effort is normal. No respiratory distress.     Breath sounds: No wheezing, rhonchi or rales.  Musculoskeletal:     Right lower leg: No edema.     Left lower leg: No edema.  Neurological:     Mental Status: She is alert.  Psychiatric:        Mood and Affect: Mood normal.        Behavior: Behavior normal.    Data Reviewed: K+ 3.3 > replete Hgb stable 11.8  Family Communication: none  Disposition: Status is: Inpatient Remains inpatient appropriate because: hypoxia  Planned Discharge Destination: Home    Time spent: 20 minutes  Author: Murray Hodgkins, MD 06/22/2022 8:33 AM  For on call review [email or web address].

## 2022-06-22 NOTE — Progress Notes (Signed)
RT NOTE: Patient refused suctioning by RT at this time. Patient told to call if suctioning is needed. RT will continue to monitor.

## 2022-06-23 DIAGNOSIS — J9621 Acute and chronic respiratory failure with hypoxia: Secondary | ICD-10-CM | POA: Diagnosis not present

## 2022-06-23 DIAGNOSIS — J189 Pneumonia, unspecified organism: Secondary | ICD-10-CM | POA: Diagnosis not present

## 2022-06-23 DIAGNOSIS — G4733 Obstructive sleep apnea (adult) (pediatric): Secondary | ICD-10-CM | POA: Diagnosis not present

## 2022-06-23 DIAGNOSIS — Z930 Tracheostomy status: Secondary | ICD-10-CM | POA: Diagnosis not present

## 2022-06-23 DIAGNOSIS — J9622 Acute and chronic respiratory failure with hypercapnia: Secondary | ICD-10-CM | POA: Diagnosis not present

## 2022-06-23 LAB — BASIC METABOLIC PANEL
Anion gap: 8 (ref 5–15)
BUN: 17 mg/dL (ref 6–20)
CO2: 37 mmol/L — ABNORMAL HIGH (ref 22–32)
Calcium: 9.6 mg/dL (ref 8.9–10.3)
Chloride: 93 mmol/L — ABNORMAL LOW (ref 98–111)
Creatinine, Ser: 0.86 mg/dL (ref 0.44–1.00)
GFR, Estimated: >60 mL/min (ref >60)
Glucose, Bld: 115 mg/dL — ABNORMAL HIGH (ref 70–99)
Potassium: 3.8 mmol/L (ref 3.5–5.1)
Sodium: 138 mmol/L (ref 135–145)

## 2022-06-23 MED ORDER — POTASSIUM CHLORIDE CRYS ER 20 MEQ PO TBCR
40.0000 meq | EXTENDED_RELEASE_TABLET | ORAL | Status: AC
  Filled 2022-06-23 (×2): qty 2

## 2022-06-23 MED ORDER — POTASSIUM CHLORIDE CRYS ER 20 MEQ PO TBCR: 40.0000 meq | EXTENDED_RELEASE_TABLET | Freq: Once | ORAL | Status: DC

## 2022-06-23 NOTE — TOC Progression Note (Signed)
Transition of Care [facility]) - Progression Note    Patient Details  Name: Angel Brewer MRN: [id number] Date of Birth: 1965-10-03  [facility]) CM/SW Contact  Zenon Mayo, RN Phone Number: 06/23/2022, 5:35 PM  Clinical Narrative:    Pna , sob, trach patient(5/19), HF, HLD, conts on 6 liters oxygen. TOC following.        Expected Discharge Plan and Services                                                 Social Determinants of Health (SDOH) Interventions    Readmission Risk Interventions    12/20/2021   11:33 AM 12/02/2019    1:47 PM  Readmission Risk Prevention Plan  Post Dischage Appt  Complete  Medication Screening  Complete  Transportation Screening Complete Complete  PCP or Specialist Appt within 3-5 Days Complete   HRI or [place] Complete   Social Work Consult for [place] Planning/Counseling Complete   Palliative Care Screening Not Applicable   Medication Review [employer] photographer) Complete

## 2022-06-23 NOTE — Progress Notes (Signed)
NAMECorey Brewer, MRN:  [id number], DOB:  02-06-1966, LOS: 7 ADMISSION DATE:  06/16/2022 CONSULTATION DATE:  06/17/2022 REFERRING MD:  [place] Crane - TRH CHIEF COMPLAINT:  Hypoxia, tracheostomy management   History of Present Illness:  56 year old woman who presented to [facility] ED 12/4 for persistent SOB x 2-3 days with productive cough and increased secretions from trach. PMHx significant for HTN, HLD, chronic diastolic CHF (Echo 0/9381 with EF 60-65%, mild LVH), OHS/OSA s/p tracheostomy 11/2021 (on nocturnal O2 PRN, followed by Angel Griffon, NP), DVT/PE (on Eliquis).  On ED arrival, patient reported 3-day history of SOB and cough worsened from baseline; she also reported orthopnea. Denies fever/chills, CP, significant edema, known sick contacts. Vitals in ED were notable for hypoxia with SpO2 92% on 15L NRB, mild tachycardia to low 100s, otherwise WNL. Labs with normal CBC, BMP grossly WNL with mild Cr elevation (baseline), BNP/trop WNL, VBG with minimal hypercarbia. COVID/flu negative. LA pending. CXR showed cardiomegaly and patchy opacities of R mid-lung and L lung base. Medicine called for admission.  While awaiting inpatient bed, patient had an episode of desaturation to 80s on 15L with associated increased secretions. Hypertonic saline neb and Mucomyst were administered and patient was suctioned. Resp Cx/TA was ordered and CAP coverage initiated.  PCCM consulted for assistance with hypoxia and tracheostomy/secretion management.  Pertinent Medical History:   Past Medical History:  Diagnosis Date   Chronic diastolic CHF (congestive heart failure) ([place]) 01/05/2022   DVT (deep vein thrombosis) in pregnancy    RLE DVT 02/2016   Dyspnea    Essential hypertension 08/25/2017   Hyperlipidemia 11/21/2021   Morbid obesity ([place])    Obesity hypoventilation syndrome ([place]) 05/03/2021   PE (pulmonary embolism) 02/29/2016   Sleep apnea    uses a cpap   Tracheostomy present ([place]) 01/05/2022   Size 6  shiley/cuffless  Last changed: 10/9     Discussion  Stable trach.  She does get a [place] more short of breath with exertion.  Although I did not observe it on my exam she does endorse she will occasionally have a rush of air after removing PMV following activity.  This could suggest some mild air trapping from the trach.  Otherwise she is doing fantastic with the [facility] Events: Including procedures, antibiotic start and stop dates in addition to other pertinent events   12/5 PCCM consulted for assistance with management of hypoxia and increased secretions in the setting of tracheostomy 12/6 No acute events  Interim History / Subjective:  Pt denies acute complaints.  Hoping to have trach changed prior to discharge 5L [place] O2 Tolerating PMV valve  Reports coughing improved  Objective:  Blood pressure 138/70, pulse 61, temperature 97.9 F (36.6 C), temperature source Oral, resp. rate (!) 21, height _0  (1.676 m), weight (!) 162.4 kg, last menstrual period 11/29/2015, SpO2 94 %.       No intake or output data in the 24 hours ending 06/23/22 1346  Filed Weights   06/21/22 0500 06/22/22 0449 06/23/22 0455  Weight: (!) 171.5 kg (!) 163.3 kg (!) 162.4 kg   Physical Examination: General: adult female reclined in chair in NAD   HEENT: MM pink/moist, anicteric, #6 cuffless trach midline c/d/i Neuro: AAOx4, speech clear, MAE   CV: s1s2 RRR, no m/r/g PULM: non-labored at rest, PMV in place,  O2 5L,  lungs bilaterally clear with good air entry  GI: soft, bsx4 active  Extremities: warm/dry, trace BLE  edema  Skin: no rashes or lesions   Resolved Hospital Problem List:    Assessment & Plan:   56 year old woman who presented to [facility] ED 12/4 for persistent SOB x 2-3 days with productive cough and increased secretions from trach. History of OHS/OSA requiring initial tracheostomy 11/2021. Baseline requirement of nocturnal O2 PRN. Patient is followed in  tracheostomy clinic by Angel Griffon, NP [facility] PCCM).  Community Acquired PNA Tracheostomy Status, 11/2021 OSA/OHS  -pulmonary hygiene -mobilize / deep breathing  -wean O2 for sats >90% -lasix 20 mg QD + spironolactone 31m QD, diuresis as renal function / BP permit  -trach care per protocol  -plan for trach change prior to discharge & reschedule in trach clinic at patients request  -completed abx  -ongoing encouragement for slow weight loss, pt reports 25lb weight loss since trach  PCCM will continue to follow on weekly basis. Please call sooner if new needs arise prior to next visit.  Call if pending discharge for trach change.   Best Practice: (right click and "Reselect all SmartList Selections" daily)  Per Primary Team  Critical care time: N/A   BNoe Gens MSN, APRN, NP-C, AGACNP-BC [facility] 06/23/2022, 1:46 PM   Please see [email or web address] for pager details.   From 7A-7P if no response, please call [phone] After hours, please call ELink [phone]

## 2022-06-23 NOTE — Progress Notes (Signed)
Mobility Specialist Progress Note    06/23/22 1205  Mobility  Activity Ambulated with assistance in hallway  Level of Assistance Minimal assist, patient does 75% or more  Assistive Device Front wheel walker  Distance Ambulated (ft) 215 ft  Activity Response Tolerated well  Mobility Referral Yes  $Mobility charge 1 Mobility   Pt received in chair and agreeable. No complaints. On 4LO2. Returned to chair with call bell in reach.   Hildred Alamin Mobility Specialist  Please Psychologist, sport and exercise or Rehab Office at [phone]

## 2022-06-23 NOTE — Progress Notes (Signed)
  Progress Note   Patient: Angel Brewer UYE:[id number] DOB: 12-10-1965 DOA: 06/16/2022     7 DOS: the patient was seen and examined on 06/23/2022   Brief hospital course: 56 year old woman PMH severe morbid obesity, chronic respiratory failure status post tracheostomy 11/29/2021 presented with progressive shortness of breath.,  Severely hypoxic and admitted for further treatment.  Assessment and Plan: Multifocal community-acquired pneumonia, POA Tracheostomy OSA/OHS Continues to improve.  Did have some sleep apnea last night.  Tracheostomy management as per pulmonology. 12/5 sputum: rare Pseudomonas.  Antibiotics discussed with pharmacy. Completed treatment Follow-up CT checked, scattered airspace disease.  Air trapping. Improving.  Wean oxygen as tolerated. Pulmonology following, plans for trach change prior to discharge.   Acute on chronic diastolic CHF  Acute component appears resolved.  Continue oral Lasix. Continue Aldactone and Farxiga.   Essential hypertension Remains stable.  Continue Norvasc    PMH pulmonary embolism/DVT on Eliquis Continue apixaban   Severe morbid obesity Body mass index is 58.11 kg/m. Recommend weight loss outpatient with regular physical activity and healthy dieting.   Hypokalemia.  Replete.  Likely home tomorrow after trach change.  Will need home oxygen.      Subjective:  Feels better Coughing Breathing ok Walked today  Physical Exam: Vitals:   06/23/22 0737 06/23/22 1003 06/23/22 1008 06/23/22 1540  BP:  138/70    Pulse: 65 61  91  Resp: (!) 21   20  Temp:  97.9 F (36.6 C)    TempSrc:  Oral    SpO2: 90% 98% 94%   Weight:      Height:       Physical Exam Vitals reviewed.  Constitutional:      General: She is not in acute distress.    Appearance: She is not ill-appearing or toxic-appearing.     Comments: Sitting in chair  Cardiovascular:     Rate and Rhythm: Normal rate and regular rhythm.     Heart sounds: No murmur  heard. Pulmonary:     Effort: Pulmonary effort is normal. No respiratory distress.     Breath sounds: No wheezing, rhonchi or rales.  Musculoskeletal:     Right lower leg: No edema.     Left lower leg: No edema.  Neurological:     Mental Status: She is alert.  Psychiatric:        Mood and Affect: Mood normal.        Behavior: Behavior normal.     Data Reviewed: No new data  Family Communication: none  Disposition: Status is: Inpatient Remains inpatient appropriate because: hypoxia  Planned Discharge Destination: Home    Time spent: 20 minutes  Author: Murray Hodgkins, MD 06/23/2022 5:03 PM  For on call review [email or web address].

## 2022-06-24 DIAGNOSIS — Z930 Tracheostomy status: Secondary | ICD-10-CM | POA: Diagnosis not present

## 2022-06-24 DIAGNOSIS — J9621 Acute and chronic respiratory failure with hypoxia: Secondary | ICD-10-CM | POA: Diagnosis not present

## 2022-06-24 DIAGNOSIS — J9622 Acute and chronic respiratory failure with hypercapnia: Secondary | ICD-10-CM | POA: Diagnosis not present

## 2022-06-24 DIAGNOSIS — J189 Pneumonia, unspecified organism: Secondary | ICD-10-CM | POA: Diagnosis not present

## 2022-06-24 NOTE — Discharge Summary (Signed)
Physician Discharge Summary   Patient: Angel Brewer MRN: [id number] DOB: Jun 03, 1966  Admit date:     06/16/2022  Discharge date: 06/24/22  Discharge Physician: Murray Hodgkins   PCP: [name], NP   Recommendations at discharge:  Ongoing respiratory care including tracheostomy.  Discharge Diagnoses: Principal Problem:   CAP (community acquired pneumonia) Active Problems:   Class 3 severe obesity with serious comorbidity and body mass index (BMI) of 60.0 to 69.9 in adult (HCC)   OSA (obstructive sleep apnea)   Acute on chronic diastolic CHF (congestive heart failure) ([place])   Tracheostomy dependent ([place])  Resolved Problems:   * No resolved hospital problems. *  Hospital Course: 56 year old woman PMH severe morbid obesity, chronic respiratory failure status post tracheostomy 11/29/2021 presented with progressive shortness of breath.,  Severely hypoxic and admitted for further treatment.  Treated for pneumonia with gradual clinical improvement as well as heart failure.  Now off oxygen.  Pulmonology following.  [name] exchanged prior to discharge today.  Assessment and Plan: Multifocal community-acquired pneumonia, POA Tracheostomy OSA/OHS Continues to improve.  Tracheostomy management as per pulmonology. Exchange planned today. Follow-up CT checked, scattered airspace disease.  Air trapping. Completed antibiotics   Acute on chronic diastolic CHF  Acute component resolved.  Continue oral Lasix. Continue Aldactone and Farxiga.   Essential hypertension Remains stable.  Continue Norvasc    PMH pulmonary embolism/DVT on Eliquis Continue apixaban   Severe morbid obesity Body mass index is 58.11 kg/m. Recommend weight loss outpatient with regular physical activity and healthy dieting.     Consultants:  Pulmonology   Procedures performed:  None   Disposition: Home Diet recommendation:  The patient is asked to make an attempt to improve diet and exercise patterns  to aid in medical management of this problem.  DISCHARGE MEDICATION: Allergies as of 06/24/2022   No Known Allergies      Medication List     TAKE these medications    acetaminophen 160 MG/5ML liquid Commonly known as: TYLENOL Take 18.8 mLs (600 mg total) by mouth every 4 (four) hours as needed for fever.   amLODipine 5 MG tablet Commonly known as: NORVASC TAKE 1 TABLET (5 MG TOTAL) BY MOUTH DAILY.   apixaban 5 MG Tabs tablet Commonly known as: ELIQUIS Take 1 tablet (5 mg total) by mouth 2 (two) times daily.   atorvastatin 20 MG tablet Commonly known as: LIPITOR Take 1 tablet (20 mg total) by mouth daily.   dapagliflozin propanediol 10 MG Tabs tablet Commonly known as: FARXIGA Take 1 tablet (10 mg total) by mouth daily.   furosemide 20 MG tablet Commonly known as: LASIX Take 1 tablet (20 mg total) by mouth daily.   Misc. Devices Misc Please supply patient with tracheostomy humidifier  Z93.0   PRESCRIPTION MEDICATION 1 each by Other route at bedtime. CPAP- At bedtime   spironolactone 25 MG tablet Commonly known as: ALDACTONE Take 1 tablet (25 mg total) by mouth daily.   topiramate 25 MG tablet Commonly known as: TOPAMAX Take 25 mg by mouth daily.   Ventolin HFA 108 (90 Base) MCG/ACT inhaler Generic drug: albuterol Inhale 1-2 puffs into the lungs every 6 (six) hours as needed for wheezing or shortness of breath.               Durable Medical Equipment  (From admission, onward)           Start     Ordered   06/23/22 1703  For home use only DME  oxygen  Once       Question Answer Comment  Length of Need 6 Months   Mode or (Route) Nasal cannula   Liters per Minute 5   Frequency Continuous (stationary and portable oxygen unit needed)   Oxygen conserving device Yes   Oxygen delivery system Gas      06/23/22 1702            Follow-up Information     [name], NP. Go on 07/23/2022.   Specialty: Nurse Practitioner Why:  _0 :[place] Plan information: [place] [address] [phone]         [facility] Follow up in 1 month(s).   Specialty: Respiratory Therapy Why: Follow up with [name], ACNP for trach change.  RT department will call to schedule appt. Contact information: [address] [id number] [place] [address] [phone]               Feels better Ambulating  Discharge Exam: Filed Weights   06/22/22 0449 06/23/22 0455 06/24/22 0651  Weight: (!) 163.3 kg (!) 162.4 kg (!) 169.2 kg   Physical Exam Vitals reviewed.  Constitutional:      General: She is not in acute distress.    Appearance: She is not ill-appearing or toxic-appearing.  Cardiovascular:     Rate and Rhythm: Normal rate and regular rhythm.     Heart sounds: No murmur heard. Pulmonary:     Effort: Pulmonary effort is normal. No respiratory distress.     Breath sounds: No wheezing, rhonchi or rales.  Neurological:     Mental Status: She is alert.  Psychiatric:        Mood and Affect: Mood normal.        Behavior: Behavior normal.      Condition at discharge: good  The results of significant diagnostics from this hospitalization (including imaging, microbiology, ancillary and laboratory) are listed below for reference.   Imaging Studies: CT CHEST WO CONTRAST  Result Date: 06/20/2022 CLINICAL DATA:  A 56 year old female presents for evaluation of pneumonia or suspected pneumonia. EXAM: CT CHEST WITHOUT CONTRAST TECHNIQUE: Multidetector CT imaging of the chest was performed following the standard protocol without IV contrast. RADIATION DOSE REDUCTION: This exam was performed according to the departmental dose-optimization program which includes automated exposure control, adjustment of the mA and/or kV according to patient size and/or use of iterative reconstruction technique. COMPARISON:  May 01, 2021 FINDINGS:  Cardiovascular: Normal heart size. No pericardial effusion or sign of pericardial nodularity. Smooth contour of the thoracic aorta. Scattered aortic atherosclerotic changes. Normal caliber of central pulmonary vessels. Limited assessment of cardiovascular structures given lack of intravenous contrast. Mediastinum/Nodes: Scattered small lymph nodes throughout the chest are similar to previous imaging, for instance RIGHT paratracheal lymph node (image 43/3) 14 mm short axis previously approximately 14-15 mm short axis. Mild fullness of subcarinal nodal tissue is similarly unchanged at 13 mm short axis. Pre-vascular lymph nodes less than a cm are stable. Fullness about the RIGHT hilum may be due to underlying nodal enlargement but the contour of the RIGHT hilum is not substantially changed at 16 mm for potential nodal enlargement on image 67/3 when compared to prior contrasted imaging. No internal mammary adenopathy. No axillary lymphadenopathy. No gross adenopathy at the thoracic inlet. Lungs/Pleura: Tracheostomy tube in place in the proximal trachea. No signs of pleural effusion. Signs of airspace disease in the RIGHT mid chest in the superior segment of the  RIGHT lower lobe and about the RIGHT hilum. Dependent airspace disease at the bilateral lower lobes in the medial chest as well. Extensive ground-glass with mosaic attenuation elsewhere. Upper Abdomen: Hepatic steatosis. Limited imaging of upper abdominal contents without acute process. Musculoskeletal: No acute bone finding. No destructive bone process. Spinal degenerative changes. IMPRESSION: 1. Scattered areas of airspace disease and findings of suspected air trapping given presence of mosaic attenuation. Many areas of airspace consolidation appear to be compatible with volume loss. Difficult to exclude the possibility of pneumonia. 2. Gross stability of enlarged mediastinal and hilar lymph nodes, potentially reactive. Constellation of findings could also be  seen with heart failure. Given RIGHT hilar fullness that is not well assessed consider evaluation on follow-up at 6 months with chest CT. 3. Hepatic steatosis. 4. Aortic atherosclerosis. Aortic Atherosclerosis (ICD10-I70.0). Electronically Signed   By: Zetta Bills M.D.   On: 06/20/2022 16:23   DG Chest Portable 1 View  Result Date: 06/16/2022 CLINICAL DATA:  Hypoxia EXAM: PORTABLE CHEST 1 VIEW COMPARISON:  Chest x-ray 12/05/2021 FINDINGS: The tip of the tracheostomy is at the level of the clavicular heads, unchanged. The heart is enlarged. There central pulmonary vascular congestion. There is some patchy opacities in the right mid lung and left lung base. There is no pleural effusion or pneumothorax. No acute fractures are seen. IMPRESSION: 1. Cardiomegaly with central pulmonary vascular congestion. 2. Patchy opacities in the right mid lung and left lung base, which may represent edema or infection. Electronically Signed   By: Ronney Asters M.D.   On: 06/16/2022 20:44    Microbiology: Results for orders placed or performed during the hospital encounter of 06/16/22  Resp Panel by RT-PCR (Flu A&B, Covid) Anterior Nasal Swab     Status: None   Collection Time: 06/16/22  8:40 PM   Specimen: Anterior Nasal Swab  Result Value Ref Range Status   SARS Coronavirus 2 by RT PCR NEGATIVE NEGATIVE Final    Comment: (NOTE) SARS-CoV-2 target nucleic acids are NOT DETECTED.  The SARS-CoV-2 RNA is generally detectable in upper respiratory specimens during the acute phase of infection. The lowest concentration of SARS-CoV-2 viral copies this assay can detect is 138 copies/mL. A negative result does not preclude SARS-Cov-2 infection and should not be used as the sole basis for treatment or other patient management decisions. A negative result may occur with  improper specimen collection/handling, submission of specimen other than nasopharyngeal swab, presence of viral mutation(s) within the areas targeted by  this assay, and inadequate number of viral copies(<138 copies/mL). A negative result must be combined with clinical observations, patient history, and epidemiological information. The expected result is Negative.  Fact Sheet for Patients:  EntrepreneurPulse.com.au  Fact Sheet for Healthcare Providers:  IncredibleEmployment.be  This test is no t yet approved or cleared by the [place] FDA and  has been authorized for detection and/or diagnosis of SARS-CoV-2 by FDA under an Emergency Use Authorization (EUA). This EUA will remain  in effect (meaning this test can be used) for the duration of the COVID-19 declaration under Section 564(b)(1) of the Act, 21 U.S.C.section 360bbb-3(b)(1), unless the authorization is terminated  or revoked sooner.       Influenza A by PCR NEGATIVE NEGATIVE Final   Influenza B by PCR NEGATIVE NEGATIVE Final    Comment: (NOTE) The Xpert Xpress SARS-CoV-2/FLU/RSV plus assay is intended as an aid in the diagnosis of influenza from Nasopharyngeal swab specimens and should not be used as a sole basis for  treatment. Nasal washings and aspirates are unacceptable for Xpert Xpress SARS-CoV-2/FLU/RSV testing.  Fact Sheet for Patients: EntrepreneurPulse.com.au  Fact Sheet for Healthcare Providers: IncredibleEmployment.be  This test is not yet approved or cleared by the [place] FDA and has been authorized for detection and/or diagnosis of SARS-CoV-2 by FDA under an Emergency Use Authorization (EUA). This EUA will remain in effect (meaning this test can be used) for the duration of the COVID-19 declaration under Section 564(b)(1) of the Act, 21 U.S.C. section 360bbb-3(b)(1), unless the authorization is terminated or revoked.  Performed at [facility], [place] [address], [place], [place] [address]   Respiratory (~20 pathogens) panel by PCR     Status: Abnormal   Collection  Time: 06/17/22  5:36 AM   Specimen: Nasopharyngeal Swab; Respiratory  Result Value Ref Range Status   Adenovirus NOT DETECTED NOT DETECTED Final   Coronavirus 229E NOT DETECTED NOT DETECTED Final    Comment: (NOTE) The Coronavirus on the Respiratory Panel, DOES NOT test for the novel  Coronavirus (2019 nCoV)    Coronavirus HKU1 NOT DETECTED NOT DETECTED Final   Coronavirus NL63 NOT DETECTED NOT DETECTED Final   Coronavirus OC43 NOT DETECTED NOT DETECTED Final   Metapneumovirus DETECTED (A) NOT DETECTED Final   Rhinovirus / Enterovirus NOT DETECTED NOT DETECTED Final   Influenza A NOT DETECTED NOT DETECTED Final   Influenza B NOT DETECTED NOT DETECTED Final   Parainfluenza Virus 1 NOT DETECTED NOT DETECTED Final   Parainfluenza Virus 2 NOT DETECTED NOT DETECTED Final   Parainfluenza Virus 3 NOT DETECTED NOT DETECTED Final   Parainfluenza Virus 4 NOT DETECTED NOT DETECTED Final   Respiratory Syncytial Virus NOT DETECTED NOT DETECTED Final   Bordetella pertussis NOT DETECTED NOT DETECTED Final   Bordetella Parapertussis NOT DETECTED NOT DETECTED Final   Chlamydophila pneumoniae NOT DETECTED NOT DETECTED Final   Mycoplasma pneumoniae NOT DETECTED NOT DETECTED Final    Comment: Performed at [facility], [address] [place], [place] [address]  Expectorated Sputum Assessment w Gram Stain, Rflx to Resp Cult     Status: None   Collection Time: 06/17/22  1:03 PM   Specimen: Sputum  Result Value Ref Range Status   Specimen Description SPU  Final   Special Requests Normal  Final   Sputum evaluation   Final    THIS SPECIMEN IS ACCEPTABLE FOR SPUTUM CULTURE Performed at [facility] [address] [place], [place] [address]    Report Status 06/18/2022 FINAL  Final  Culture, Respiratory w Gram Stain     Status: None   Collection Time: 06/17/22  1:03 PM   Specimen: Sputum  Result Value Ref Range Status   Specimen Description SPU  Final   Special Requests Normal Reflexed  from [id number]  Final   Gram Stain   Final    MODERATE WBC PRESENT,BOTH PMN AND MONONUCLEAR NO ORGANISMS SEEN Performed at [facility], [place] [address] [place], [place] [address]    Culture RARE PSEUDOMONAS AERUGINOSA  Final   Report Status 06/20/2022 FINAL  Final   Organism ID, Bacteria PSEUDOMONAS AERUGINOSA  Final      Susceptibility   Pseudomonas aeruginosa - MIC*    CEFTAZIDIME 4 SENSITIVE Sensitive     CIPROFLOXACIN <=0.25 SENSITIVE Sensitive     GENTAMICIN <=1 SENSITIVE Sensitive     IMIPENEM 2 SENSITIVE Sensitive     PIP/TAZO 8 SENSITIVE Sensitive     CEFEPIME 2 SENSITIVE Sensitive     *  RARE PSEUDOMONAS AERUGINOSA    Labs: CBC: Recent Labs  Lab 06/18/22 0048 06/22/22 0026  WBC 4.6 4.3  HGB 11.9* 11.8*  HCT 38.8 39.0  MCV 93.7 92.6  PLT 156 476   Basic Metabolic Panel: Recent Labs  Lab 06/18/22 0048 06/22/22 0026 06/23/22 1847  NA 141 140 138  K 3.9 3.3* 3.8  CL 101 93* 93*  CO2 30 35* 37*  GLUCOSE 93 93 115*  BUN _0 CREATININE 0.88 0.95 0.86  CALCIUM 8.6* 9.2 9.6   Liver Function Tests: No results for input(s): "AST", "ALT", "ALKPHOS", "BILITOT", "PROT", "ALBUMIN" in the last 168 hours. CBG: No results for input(s): "GLUCAP" in the last 168 hours.  Discharge time spent: greater than 30 minutes.  Signed: Murray Hodgkins, MD [facility] 06/24/2022

## 2022-06-24 NOTE — Progress Notes (Signed)
Pt's oxygen at rest on RA: 92%  Pt's oxygen during ambulation: 90%

## 2022-06-24 NOTE — Procedures (Signed)
Tracheostomy Change Note  Patient Details:   Name: Angel Brewer DOB: 11-14-1965 MRN: [id number]    Airway Documentation:     Evaluation  O2 sats: stable throughout Complications: No apparent complications Patient did tolerate procedure well. Bilateral Breath Sounds: Diminished   Rtx2 changed patients trach from #6 shiley uncuffed to #6 shiley uncuffed per MD order. No complicaitons, VS stable. Positive color changed noted. RT will continue to monitor as needed.   Fabiola Backer 06/24/2022, 3:51 PM

## 2022-06-24 NOTE — Progress Notes (Signed)
RN went over discharge instructions with patient at the bedside. RN notified patient that they were no medication changes and made the patient aware of follow up appointments. Patient verbalize understanding. NT removed PIV. CCMD notified of discharge. Patient is getting dressed. [name] has been exchanged via RT. Transportation has been called for dc.

## 2022-06-25 ENCOUNTER — Telehealth: Payer: Self-pay

## 2022-06-25 NOTE — Telephone Encounter (Signed)
Transition Care Management Unsuccessful Follow-up Telephone Call  Date of discharge and from where:  06/24/2022, [facility]  Attempts:  1st Attempt  Reason for unsuccessful TCM follow-up call:  Unable to leave message- voicemail not set up [phone]. I called [phone] and the number is not in service. I also called [phone] and the recording stated that the call could not be completed at this time.  Patient has an appointment with [name], NP at [facility] on 07/23/2022.

## 2022-06-26 ENCOUNTER — Telehealth: Payer: Self-pay

## 2022-06-26 NOTE — Telephone Encounter (Signed)
Transition Care Management Follow-up Telephone Call Date of discharge and from where: 06/24/2022, [facility]  How have you been since you were released from the hospital? She stated she is feeling better, just a [place] stiff.  Any questions or concerns? No  Items Reviewed: Did the pt receive and understand the discharge instructions provided? Yes  Medications obtained and verified? Yes - she said she has all of her medications and did not have any questions about the med regime Other? No  Any new allergies since your discharge? No  Dietary orders reviewed? Yes Do you have support at home? Yes   Home Care and Equipment/Supplies: Were home health services ordered? no If so, what is the name of the agency? N/a  Has the agency set up a time to come to the patient's home? not applicable Were any new equipment or medical supplies ordered?  No What is the name of the medical supply agency? N/a Were you able to get the supplies/equipment? not applicable Do you have any questions related to the use of the equipment or supplies? No  Functional Questionnaire: (I = Independent and D = Dependent) ADLs: independent but she said she has help available if needed. She said that she has all of the necessary trach supplies. She also has O2 that she uses at night at 2-3 L.    Follow up appointments reviewed:  PCP Hospital f/u appt confirmed? Yes  Scheduled to see [name], NP - 07/23/2022.  [facility] f/u appt confirmed?  None scheduled yet.    Are transportation arrangements needed?  She is not sure. She is aware that her insurance company provides rides to medical appointments If their condition worsens, is the pt aware to call PCP or go to the Emergency Dept.? Yes Was the patient provided with contact information for the PCP's office or ED? Yes Was to pt encouraged to call back with questions or concerns? Yes

## 2022-07-19 DIAGNOSIS — J9690 Respiratory failure, unspecified, unspecified whether with hypoxia or hypercapnia: Secondary | ICD-10-CM | POA: Diagnosis not present

## 2022-07-23 ENCOUNTER — Ambulatory Visit: Payer: Medicaid Other | Admitting: Nurse Practitioner

## 2022-07-23 ENCOUNTER — Ambulatory Visit: Payer: Self-pay | Admitting: *Deleted

## 2022-07-23 NOTE — Telephone Encounter (Signed)
Message from [place] sent at 07/23/2022 10:48 AM EST  Summary: mild pain in knees and lege   Patient called in with mild pain in knees and legs. She has an appt 03/01,so didn't want to make another appt. She is asking for inflammatory med to be sent to pharmacy          Call History   Type Contact Phone/Fax User  07/23/2022 10:47 AM EST Phone (Incoming) [place], [place] (Self) [phone] Jerilynn Mages) Camille Bal, [place]   Reason for Disposition  Knee pain is a chronic symptom (recurrent or ongoing AND present > 4 weeks)    Has an appt with [facility] on 09/12/2022.    She's going to wait until that appt.  This is a chronic pain in both knees.  Answer Assessment - Initial Assessment Questions 1. LOCATION and RADIATION: "Where is the pain located?"     Pain in my knees from inflammation from standing on my job.   I've tried Tylenol and ibuprofen but it doesn't help. 2. QUALITY: "What does the pain feel like?"  (e.g., sharp, dull, aching, burning)     Aching in my knees from inflammation.    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   She has an appt. 09/12/2022 and there isn't anything sooner at [facility] [place].    "I'll just wait for my appt.".    "Thank you anyway".    She had to get off the phone due to being at work.   Triage stopped here. 3. SEVERITY: "How bad is the pain?" "What does it keep you from doing?"   (Scale 1-10; or mild, moderate, severe)   -  MILD (1-3): doesn't interfere with normal activities    -  MODERATE (4-7): interferes with normal activities (e.g., work or school) or awakens from sleep, limping    -  SEVERE (8-10): excruciating pain, unable to do any normal activities, unable to walk     Not asked 4. ONSET: "When did the pain start?" "Does it come and go, or is it there all the time?"     Chronic ongoing pain. 5. RECURRENT: "Have you had this pain before?" If Yes, ask: "When, and what happened then?"     Ongoing 6. SETTING: "Has  there been any recent work, exercise or other activity that involved that part of the body?"      I stand on my feet all day on my job. 7. AGGRAVATING FACTORS: "What makes the knee pain worse?" (e.g., walking, climbing stairs, running)     Standing all day on my job. 8. ASSOCIATED SYMPTOMS: "Is there any swelling or redness of the knee?"     Not asked 9. OTHER SYMPTOMS: "Do you have any other symptoms?" (e.g., chest pain, difficulty breathing, fever, calf pain)     Not asked 10. PREGNANCY: "Is there any chance you are pregnant?" "When was your last menstrual period?"       Not asked  Protocols used: Knee Pain-A-AH

## 2022-07-23 NOTE — Telephone Encounter (Signed)
  Chief Complaint: Bilateral knee pain from standing on her job.   She has inflammation in both of her knees. Symptoms: Both knees hurt while standing on her job. Frequency: While working Pertinent Negatives: Patient denies injuries Disposition: _0 ED /_1 Urgent Care (no appt availability in office) / _2 Appointment(In office/virtual)/ _3  [facility]/ _4 Home Care/ _5 Refused Recommended Disposition /_6 [facility]/ _7  Follow-up with PCP Additional Notes: She has an appt. On 09/12/2022 at [place].  She has decided to wait until that appt. Since there is nothing sooner.

## 2022-07-30 IMAGING — DX DG ABDOMEN 1V
1 series · 1 of 1 positions shown · non-contrast
Comparison: Abdominal x-ray 11/24/2021

CLINICAL DATA: NG tube placement

EXAM:
ABDOMEN - 1 VIEW

[abdomen]
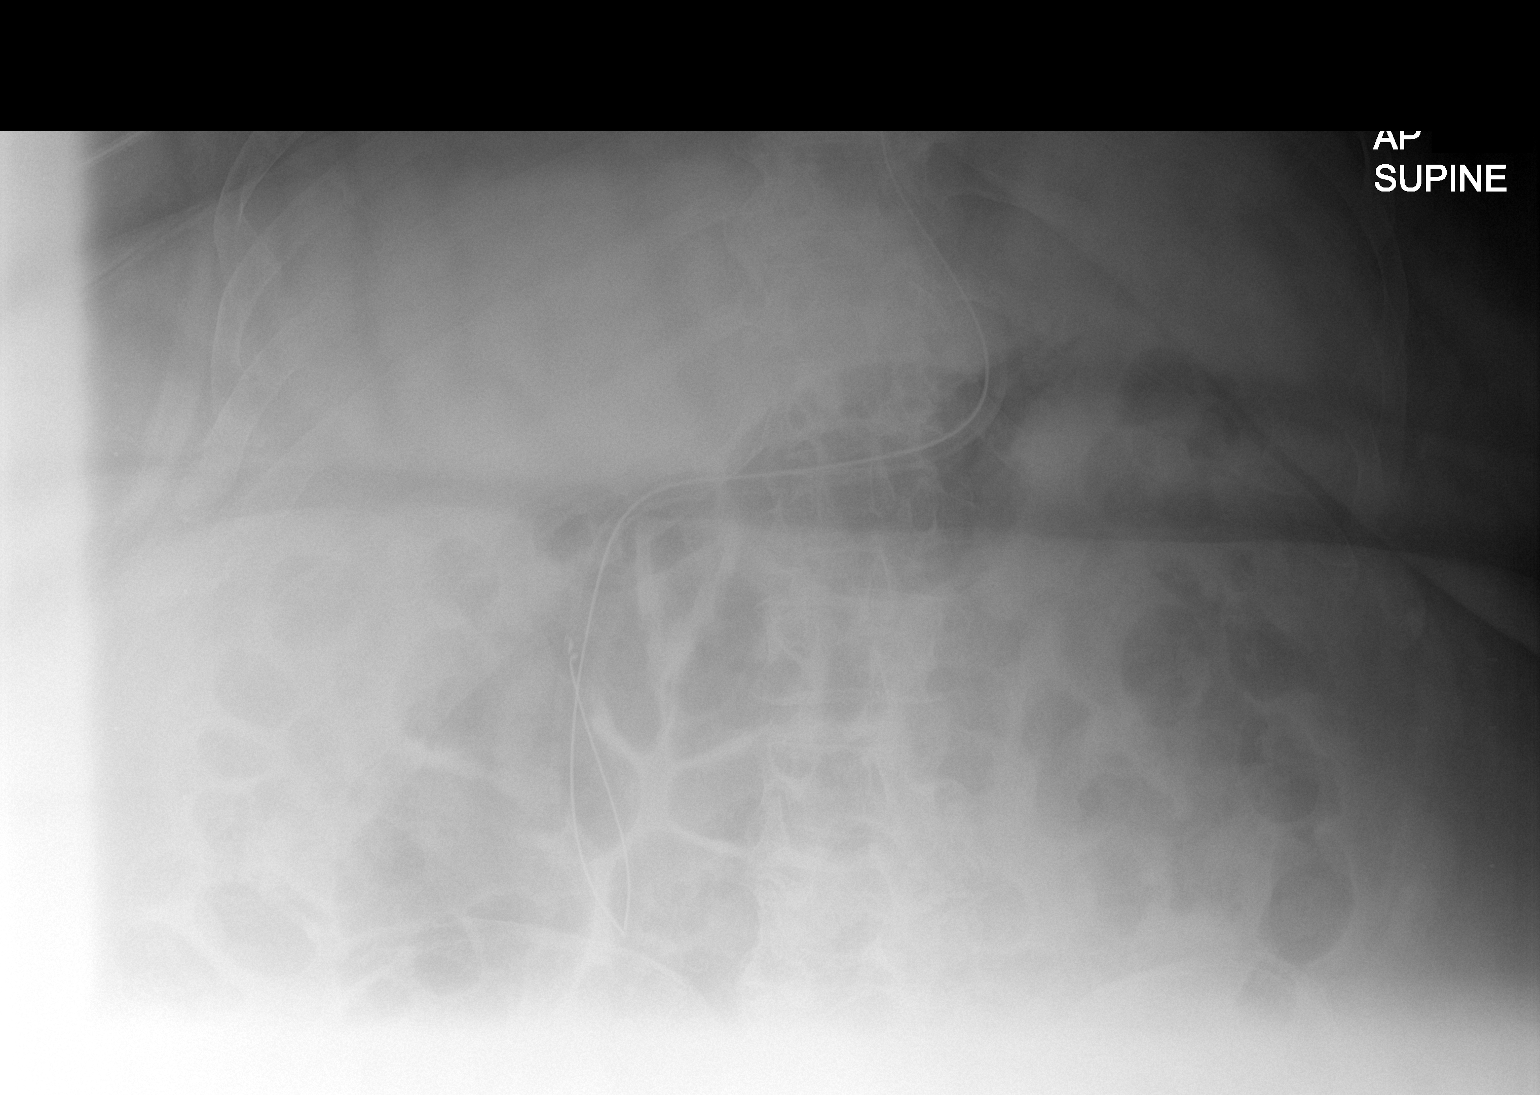

[1 of 1 positions shown; findings below may reference images not displayed]

FINDINGS: Nasogastric tube has been advanced since previous study. The tip is
in the right upper abdomen, possibly in the distal stomach or
proximal duodenum. Note is also made of kinking of the distal
catheter in the right abdomen.

No abnormally distended loops of bowel identified.
IMPRESSION: Nasogastric tube advanced since previous study, as described.

## 2022-07-30 IMAGING — DX DG ABDOMEN 1V
1 series · 1 of 1 positions shown · non-contrast
Comparison: None Available.

CLINICAL DATA: NG tube placement

EXAM:
ABDOMEN - 1 VIEW

[abdomen]
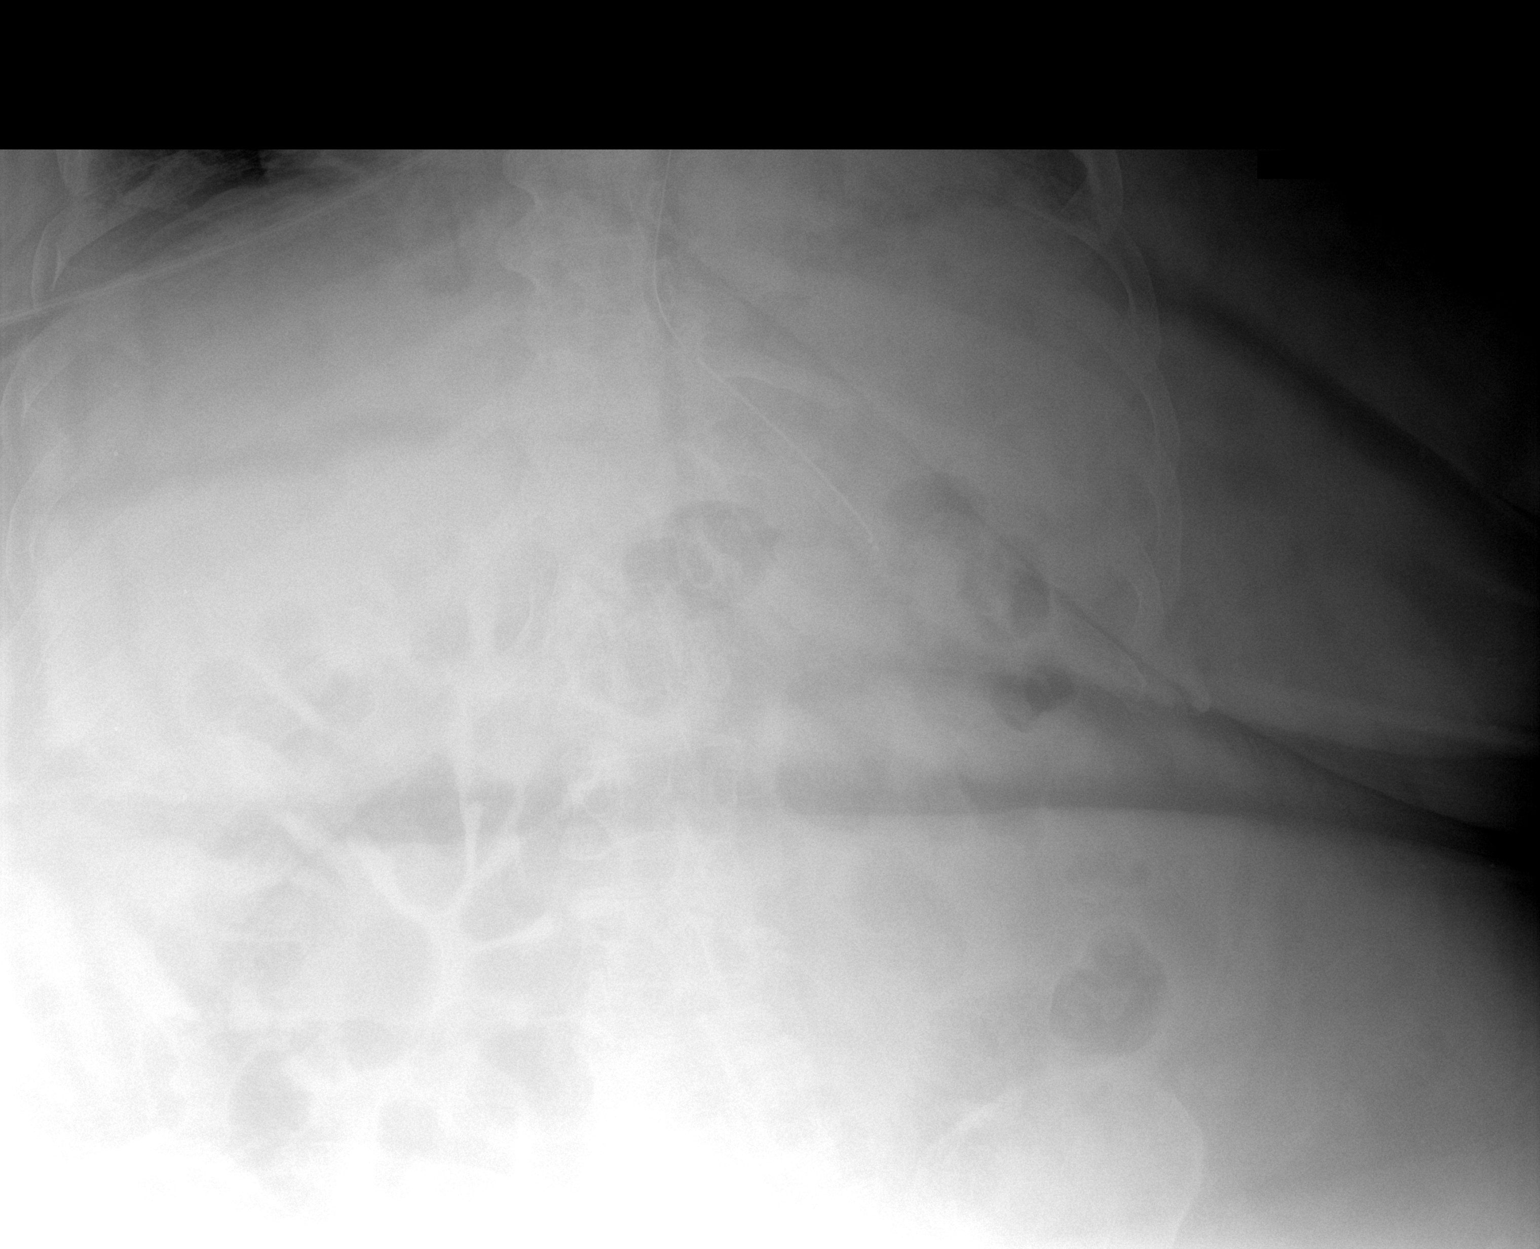

[1 of 1 positions shown; findings below may reference images not displayed]

FINDINGS: Tip of enteric tube is seen in the stomach. Side-port in the enteric
tube appears to be at the gastroesophageal junction. Transverse
diameter of heart is increased. Bowel gas pattern in the upper
abdomen is unremarkable. Lower abdomen and pelvis are not included
in the image.
IMPRESSION: Tip of enteric tube is seen in the stomach. Side-port in the enteric
tube is noted at the gastroesophageal junction. Enteric tube should
be advanced 5 cm to place the side port within the stomach.

## 2022-07-31 IMAGING — DX DG ABD PORTABLE 1V
1 series · 1 of 1 positions shown · non-contrast
Comparison: One-view abdomen 11/24/2021.

CLINICAL DATA: Feeding tube placement.

EXAM:
PORTABLE ABDOMEN - 1 VIEW

[abdomen]
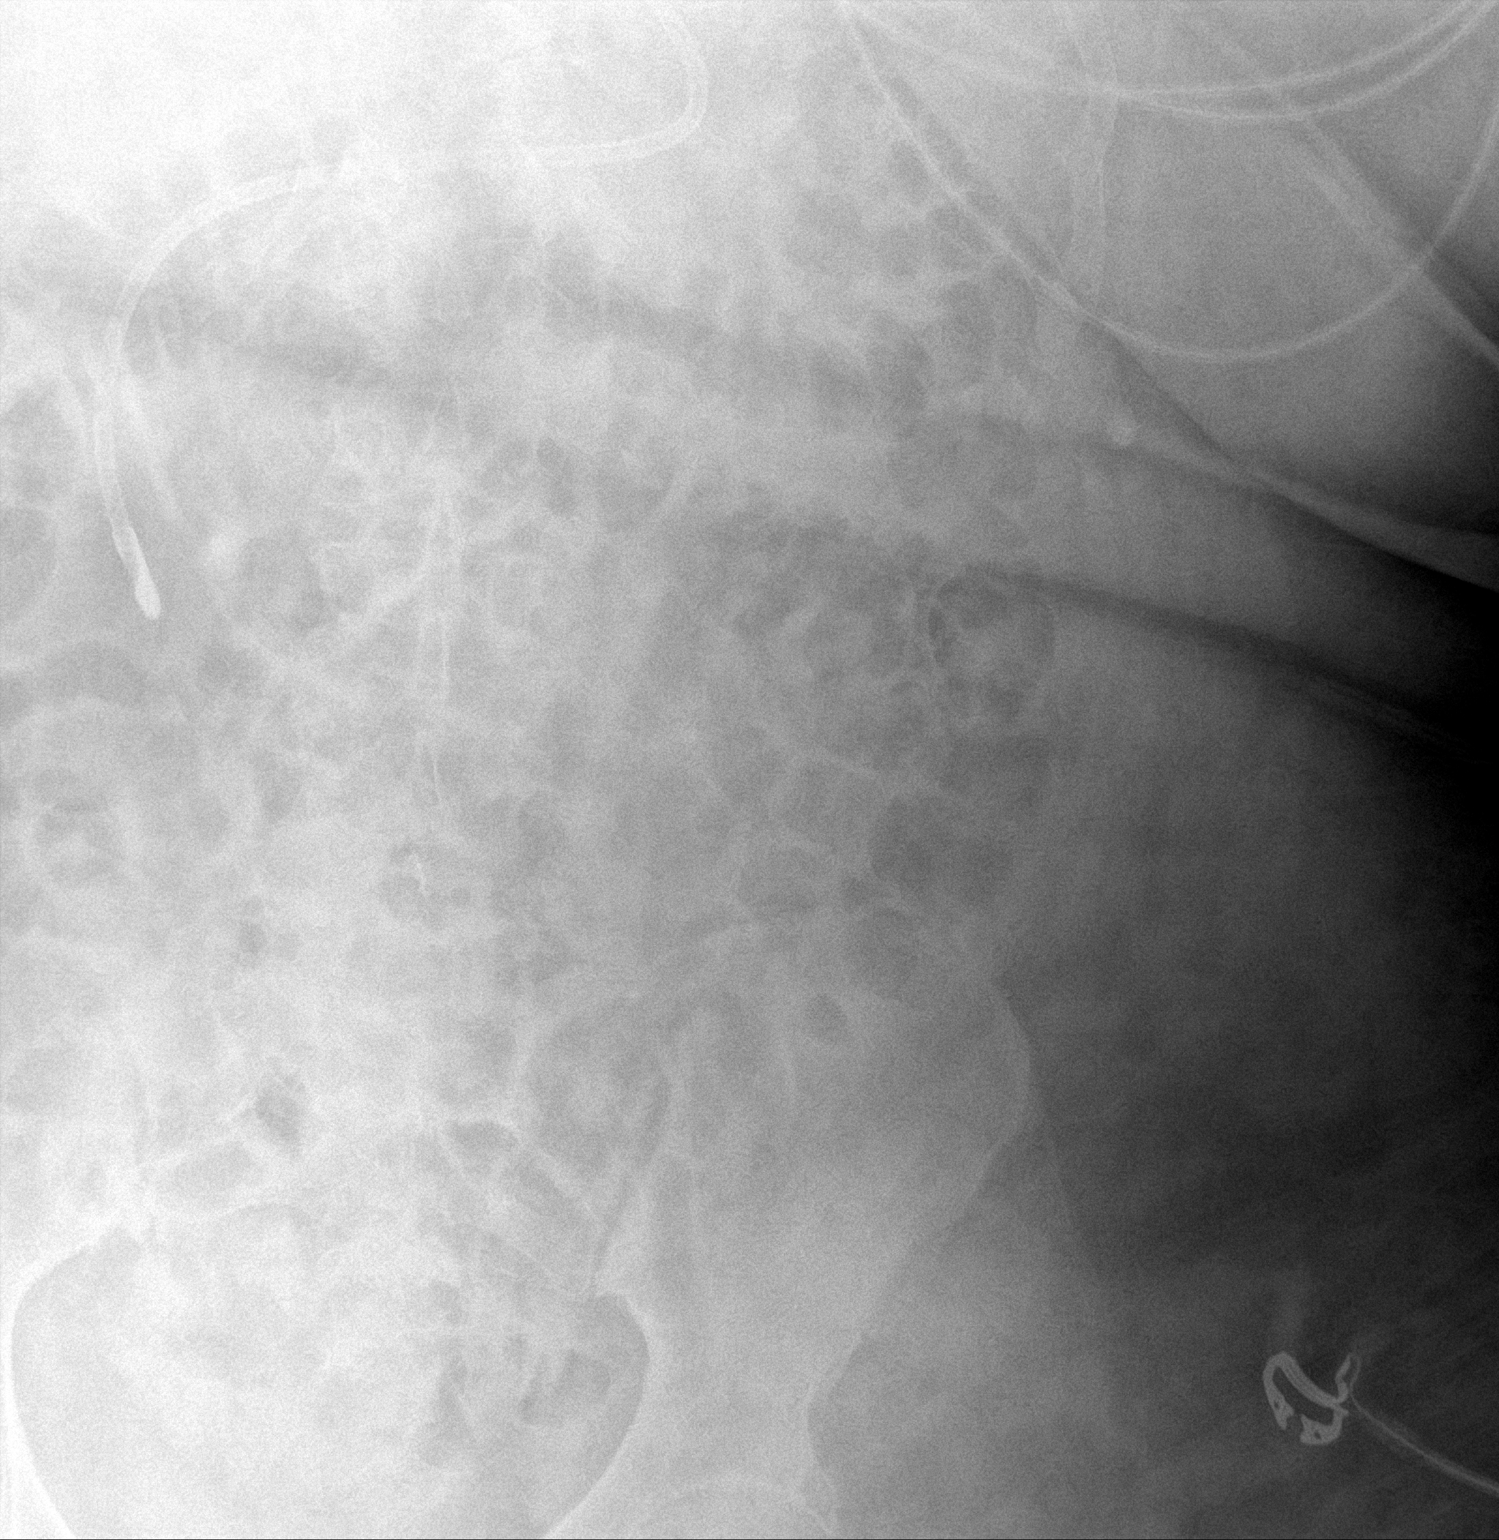

[1 of 1 positions shown; findings below may reference images not displayed]

FINDINGS: A small bore feeding tube terminates in the second portion the
duodenum. Bowel gas pattern is unremarkable.
IMPRESSION: Small bore feeding tube terminates in the second portion the
duodenum.

## 2022-08-19 DIAGNOSIS — J9690 Respiratory failure, unspecified, unspecified whether with hypoxia or hypercapnia: Secondary | ICD-10-CM | POA: Diagnosis not present

## 2022-09-01 ENCOUNTER — Other Ambulatory Visit: Payer: Self-pay | Admitting: Nurse Practitioner

## 2022-09-01 DIAGNOSIS — I10 Essential (primary) hypertension: Secondary | ICD-10-CM

## 2022-09-04 ENCOUNTER — Other Ambulatory Visit: Payer: Self-pay | Admitting: Nurse Practitioner

## 2022-09-04 DIAGNOSIS — I5032 Chronic diastolic (congestive) heart failure: Secondary | ICD-10-CM

## 2022-09-04 MED ORDER — SPIRONOLACTONE 25 MG PO TABS: 25.0000 mg | ORAL_TABLET | Freq: Every day | ORAL | 0 refills | Status: DC

## 2022-09-04 NOTE — Telephone Encounter (Signed)
Medication Refill - Medication: spironolactone (ALDACTONE) 25 MG tablet   Has the patient contacted their pharmacy? No.   Preferred Pharmacy (with phone number or street name):  [id number] [place] [phone] Fax: [phone]    Has the patient been seen for an appointment in the last year OR does the patient have an upcoming appointment? Yes.    Please assist patient further

## 2022-09-04 NOTE — Telephone Encounter (Signed)
Requested Prescriptions  Pending Prescriptions Disp Refills   spironolactone (ALDACTONE) 25 MG tablet 90 tablet 0    Sig: Take 1 tablet (25 mg total) by mouth daily.     Cardiovascular: Diuretics - Aldosterone Antagonist Failed - 09/04/2022 10:07 AM      Failed - Last BP in normal range    BP Readings from Last 1 Encounters:  06/24/22 (!) 157/74         Failed - Valid encounter within last 6 months    Recent Outpatient Visits           6 months ago Hospital discharge follow-up   [place] [name], NP   1 year ago Recurrent pulmonary emboli [facility])   [place], Vernia Buff, NP   2 years ago Hospital discharge follow-up   [facility] [place], [place] W, NP   2 years ago Chronic deep vein thrombosis (DVT) of femoral vein of left lower extremity [facility])   [place] Elsie Stain, MD   3 years ago Recurrent acute deep vein thrombosis (DVT) of right lower extremity [facility])   [place] [name], NP       Future Appointments             In 1 month [name], NP [place] in normal range and within 180 days    Creatinine  Date Value Ref Range Status  11/14/2019 0.81 0.44 - 1.00 mg/dL Final   Creat  Date Value Ref Range Status  09/09/2016 0.96 0.50 - 1.05 mg/dL Final    Comment:      For patients > or = 57 years of age: The upper reference limit for Creatinine is approximately 13% higher for people identified as African-American.      Creatinine, Ser  Date Value Ref Range Status  06/23/2022 0.86 0.44 - 1.00 mg/dL Final   Creatinine, POC  Date Value Ref Range Status  02/16/2017 300 mg/dL Final   Creatinine, Urine  Date Value Ref Range Status  11/23/2019 169.75 mg/dL Final    Comment:    Performed at  [facility], [place] [address] [place], [place] [address]         Passed - K in normal range and within 180 days    Potassium  Date Value Ref Range Status  06/23/2022 3.8 3.5 - 5.1 mmol/L Final         Passed - Na in normal range and within 180 days    Sodium  Date Value Ref Range Status  06/23/2022 138 135 - 145 mmol/L Final  09/07/2020 142 134 - 144 mmol/L Final         Passed - eGFR is 30 or above and within 180 days    GFR, Est African American  Date Value Ref Range Status  09/09/2016 79 >=60 mL/min Final   GFR, Est AFR Am  Date Value Ref Range Status  11/14/2019 >60 >60 mL/min Final   GFR calc Af Amer  Date Value Ref Range Status  09/07/2020 68 >59 mL/min/1.73 Final    Comment:    **In accordance with recommendations from the NKF-ASN Task force,**   [facility] is in the process of updating its eGFR calculation  to the   2021 CKD-EPI creatinine equation that estimates kidney function   without a race variable.    GFR, Est Non African American  Date Value Ref Range Status  09/09/2016 69 >=60 mL/min Final   GFR, Estimated  Date Value Ref Range Status  06/23/2022 >60 >60 mL/min Final    Comment:    (NOTE) Calculated using the CKD-EPI Creatinine Equation (2021)   11/14/2019 >60 >60 mL/min Final

## 2022-09-12 ENCOUNTER — Ambulatory Visit: Payer: Medicaid Other | Admitting: Nurse Practitioner

## 2022-09-17 DIAGNOSIS — J9690 Respiratory failure, unspecified, unspecified whether with hypoxia or hypercapnia: Secondary | ICD-10-CM | POA: Diagnosis not present

## 2022-09-19 ENCOUNTER — Other Ambulatory Visit: Payer: Self-pay | Admitting: Nurse Practitioner

## 2022-09-19 DIAGNOSIS — I2699 Other pulmonary embolism without acute cor pulmonale: Secondary | ICD-10-CM

## 2022-09-19 DIAGNOSIS — Z7901 Long term (current) use of anticoagulants: Secondary | ICD-10-CM

## 2022-09-19 DIAGNOSIS — I5032 Chronic diastolic (congestive) heart failure: Secondary | ICD-10-CM

## 2022-09-19 MED ORDER — DAPAGLIFLOZIN PROPANEDIOL 10 MG PO TABS: 10.0000 mg | ORAL_TABLET | Freq: Every day | ORAL | 0 refills | Status: DC

## 2022-09-19 MED ORDER — FUROSEMIDE 20 MG PO TABS: 20.0000 mg | ORAL_TABLET | Freq: Every day | ORAL | 0 refills | Status: DC

## 2022-09-19 NOTE — Telephone Encounter (Signed)
Requested Prescriptions  Pending Prescriptions Disp Refills   apixaban (ELIQUIS) 5 MG TABS tablet 180 tablet 3    Sig: Take 1 tablet (5 mg total) by mouth 2 (two) times daily.     Hematology:  Anticoagulants - apixaban Failed - 09/19/2022  1:40 PM      Failed - HGB in normal range and within 360 days    Hemoglobin  Date Value Ref Range Status  06/22/2022 11.8 (L) 12.0 - 15.0 g/dL Final  02/24/2022 11.7 11.1 - 15.9 g/dL Final         Failed - AST in normal range and within 360 days    AST  Date Value Ref Range Status  01/09/2022 14 (L) 15 - 41 U/L Final  11/14/2019 13 (L) 15 - 41 U/L Final         Passed - PLT in normal range and within 360 days    Platelets  Date Value Ref Range Status  06/22/2022 223 150 - 400 K/uL Final  02/24/2022 214 150 - 450 x10E3/uL Final         Passed - HCT in normal range and within 360 days    HCT  Date Value Ref Range Status  06/22/2022 39.0 36.0 - 46.0 % Final   Hematocrit  Date Value Ref Range Status  02/24/2022 37.1 34.0 - 46.6 % Final         Passed - Cr in normal range and within 360 days    Creatinine  Date Value Ref Range Status  11/14/2019 0.81 0.44 - 1.00 mg/dL Final   Creat  Date Value Ref Range Status  09/09/2016 0.96 0.50 - 1.05 mg/dL Final    Comment:      For patients > or = 57 years of age: The upper reference limit for Creatinine is approximately 13% higher for people identified as African-American.      Creatinine, Ser  Date Value Ref Range Status  06/23/2022 0.86 0.44 - 1.00 mg/dL Final   Creatinine, POC  Date Value Ref Range Status  02/16/2017 300 mg/dL Final   Creatinine, Urine  Date Value Ref Range Status  11/23/2019 169.75 mg/dL Final    Comment:    Performed at [facility], [place] [address] [place], [place] [address]         Passed - ALT in normal range and within 360 days    ALT  Date Value Ref Range Status  01/09/2022 12 0 - 44 U/L Final  11/14/2019 10 0 - 44 U/L Final         Passed  - Valid encounter within last 12 months    Recent Outpatient Visits           6 months ago Hospital discharge follow-up   [place], [place] W, NP   1 year ago Recurrent pulmonary emboli [facility])   [place] [name], NP   2 years ago Hospital discharge follow-up   [facility] [place], [place] W, NP   2 years ago Chronic deep vein thrombosis (DVT) of femoral vein of left lower extremity [facility])   [place] Elsie Stain, MD   3 years ago Recurrent acute deep vein thrombosis (DVT) of right lower extremity [facility])   [place] [name], NP       Future Appointments  In 3 weeks [name], NP [place]             dapagliflozin propanediol (FARXIGA) 10 MG TABS tablet 90 tablet 0    Sig: Take 1 tablet (10 mg total) by mouth daily.     Endocrinology:  Diabetes - SGLT2 Inhibitors Failed - 09/19/2022  1:40 PM      Failed - HBA1C is between 0 and 7.9 and within 180 days    Hemoglobin A1C  Date Value Ref Range Status  06/10/2016 5.4  Final         Failed - Valid encounter within last 6 months    Recent Outpatient Visits           6 months ago Hospital discharge follow-up   [place] [name], NP   1 year ago Recurrent pulmonary emboli [facility])   [place] [name], NP   2 years ago Hospital discharge follow-up   [place] [facility] [place], [place] W, NP   2 years ago Chronic deep vein thrombosis (DVT) of femoral vein of left lower extremity [facility])   [place] Elsie Stain, MD   3 years ago Recurrent acute deep vein thrombosis (DVT) of right lower extremity [facility])   [place] [name], NP       Future Appointments             In 3 weeks [name], NP [place] in normal range and within 360 days    Creatinine  Date Value Ref Range Status  11/14/2019 0.81 0.44 - 1.00 mg/dL Final   Creat  Date Value Ref Range Status  09/09/2016 0.96 0.50 - 1.05 mg/dL Final    Comment:      For patients > or = 57 years of age: The upper reference limit for Creatinine is approximately 13% higher for people identified as African-American.      Creatinine, Ser  Date Value Ref Range Status  06/23/2022 0.86 0.44 - 1.00 mg/dL Final   Creatinine, POC  Date Value Ref Range Status  02/16/2017 300 mg/dL Final   Creatinine, Urine  Date Value Ref Range Status  11/23/2019 169.75 mg/dL Final    Comment:    Performed at [facility], [place] [address], [place], [place] [address]         Passed - eGFR in normal range and within 360 days    GFR, Est African American  Date Value Ref Range Status  09/09/2016 79 >=60 mL/min Final   GFR, Est AFR Am  Date Value Ref Range Status  11/14/2019 >60 >60 mL/min Final   GFR calc Af Amer  Date Value Ref Range Status  09/07/2020 68 >59 mL/min/1.73 Final    Comment:    **In accordance with recommendations from the NKF-ASN Task force,**   [facility] is in the process of updating its eGFR calculation to the   2021 CKD-EPI creatinine equation that estimates kidney function   without a race variable.    GFR, Est Non African American  Date Value Ref Range Status  09/09/2016 69 >=60 mL/min Final   GFR, Estimated  Date Value Ref Range Status  06/23/2022 >60 >60  mL/min Final    Comment:    (NOTE) Calculated using the CKD-EPI Creatinine Equation (2021)   11/14/2019 >60 >60 mL/min Final          furosemide (LASIX) 20 MG tablet 90 tablet 0    Sig: Take 1 tablet (20 mg total) by mouth daily.     Cardiovascular:   Diuretics - Loop Failed - 09/19/2022  1:40 PM      Failed - Cl in normal range and within 180 days    Chloride  Date Value Ref Range Status  06/23/2022 93 (L) 98 - 111 mmol/L Final         Failed - Last BP in normal range    BP Readings from Last 1 Encounters:  06/24/22 (!) 157/74         Failed - Valid encounter within last 6 months    Recent Outpatient Visits           6 months ago Hospital discharge follow-up   [place], [place] W, NP   1 year ago Recurrent pulmonary emboli [facility])   [place], [place], NP   2 years ago Hospital discharge follow-up   [facility] [name], NP   2 years ago Chronic deep vein thrombosis (DVT) of femoral vein of left lower extremity [facility])   [place] Elsie Stain, MD   3 years ago Recurrent acute deep vein thrombosis (DVT) of right lower extremity [facility])   [place], Vernia Buff, NP       Future Appointments             In 3 weeks [name], NP [place] in normal range and within 180 days    Potassium  Date Value Ref Range Status  06/23/2022 3.8 3.5 - 5.1 mmol/L Final         Passed - Ca in normal range and within 180 days    Calcium  Date Value Ref Range Status  06/23/2022 9.6 8.9 - 10.3 mg/dL Final   Calcium, Ion  Date Value Ref Range Status  06/16/2022 1.15 1.15 - 1.40 mmol/L Final         Passed - Na in normal range and within 180 days    Sodium  Date Value Ref Range Status  06/23/2022 138 135 - 145 mmol/L Final  09/07/2020 142 134 - 144 mmol/L Final         Passed - Cr in normal range and within 180 days    Creatinine  Date Value Ref Range Status  11/14/2019 0.81 0.44 - 1.00 mg/dL Final   Creat  Date Value Ref Range Status  09/09/2016 0.96  0.50 - 1.05 mg/dL Final    Comment:      For patients > or = 57 years of age: The upper reference limit for Creatinine is approximately 13% higher for people identified as African-American.      Creatinine, Ser  Date Value Ref Range Status  06/23/2022 0.86 0.44 - 1.00 mg/dL Final   Creatinine, POC  Date Value Ref Range Status  02/16/2017 300 mg/dL Final   Creatinine, Urine  Date Value Ref Range Status  11/23/2019 169.75 mg/dL Final    Comment:  Performed at [facility], [place] [address], [place], [place] [address]         Passed - Mg Level in normal range and within 180 days    Magnesium  Date Value Ref Range Status  06/17/2022 2.2 1.7 - 2.4 mg/dL Final    Comment:    Performed at [facility], [place] [address] [place], [place] [address]

## 2022-09-19 NOTE — Telephone Encounter (Signed)
Medication Refill - Medication: dapagliflozin propanediol (FARXIGA) 10 MG TABS tablet [id number]   furosemide (LASIX) 20 MG tablet [id number]   apixaban (ELIQUIS) 5 MG TABS tablet [id number]  Has the patient contacted their pharmacy? Yes.   (Agent: If no, request that the patient contact the pharmacy for the refill. If patient does not wish to contact the pharmacy document the reason why and proceed with request.) (Agent: If yes, when and what did the pharmacy advise?)  Preferred Pharmacy (with phone number or street name):  [id number] [place] [id number]: [phone] Fax: [phone]  Has the patient been seen for an appointment in the last year OR does the patient have an upcoming appointment? Yes.    Agent: Please be advised that RX refills may take up to 3 business days. We ask that you follow-up with your pharmacy.

## 2022-09-22 ENCOUNTER — Other Ambulatory Visit: Payer: Self-pay | Admitting: Nurse Practitioner

## 2022-09-22 ENCOUNTER — Other Ambulatory Visit: Payer: Self-pay | Admitting: Family Medicine

## 2022-09-22 DIAGNOSIS — I10 Essential (primary) hypertension: Secondary | ICD-10-CM

## 2022-09-22 DIAGNOSIS — I5032 Chronic diastolic (congestive) heart failure: Secondary | ICD-10-CM

## 2022-10-16 ENCOUNTER — Encounter: Payer: Self-pay | Admitting: Nurse Practitioner

## 2022-10-16 ENCOUNTER — Ambulatory Visit: Payer: Medicaid Other | Attending: Nurse Practitioner | Admitting: Nurse Practitioner

## 2022-10-16 ENCOUNTER — Telehealth: Payer: Self-pay

## 2022-10-16 VITALS — BP 120/69 | HR 59 | Ht 66.0 in | Wt 384.4 lb

## 2022-10-16 DIAGNOSIS — E7800 Pure hypercholesterolemia, unspecified: Secondary | ICD-10-CM | POA: Diagnosis not present

## 2022-10-16 DIAGNOSIS — Z124 Encounter for screening for malignant neoplasm of cervix: Secondary | ICD-10-CM

## 2022-10-16 DIAGNOSIS — I10 Essential (primary) hypertension: Secondary | ICD-10-CM | POA: Diagnosis not present

## 2022-10-16 DIAGNOSIS — Z7901 Long term (current) use of anticoagulants: Secondary | ICD-10-CM

## 2022-10-16 DIAGNOSIS — I5032 Chronic diastolic (congestive) heart failure: Secondary | ICD-10-CM | POA: Diagnosis not present

## 2022-10-16 DIAGNOSIS — D649 Anemia, unspecified: Secondary | ICD-10-CM

## 2022-10-16 DIAGNOSIS — I2699 Other pulmonary embolism without acute cor pulmonale: Secondary | ICD-10-CM

## 2022-10-16 MED ORDER — APIXABAN 5 MG PO TABS: 5.0000 mg | ORAL_TABLET | Freq: Two times a day (BID) | ORAL | 3 refills | Status: DC

## 2022-10-16 MED ORDER — SPIRONOLACTONE 25 MG PO TABS: 25.0000 mg | ORAL_TABLET | Freq: Every day | ORAL | 1 refills | Status: DC

## 2022-10-16 MED ORDER — DAPAGLIFLOZIN PROPANEDIOL 10 MG PO TABS: 10.0000 mg | ORAL_TABLET | Freq: Every day | ORAL | 1 refills | Status: DC

## 2022-10-16 MED ORDER — ATORVASTATIN CALCIUM 20 MG PO TABS: 20.0000 mg | ORAL_TABLET | Freq: Every day | ORAL | 1 refills | Status: DC

## 2022-10-16 MED ORDER — AMLODIPINE BESYLATE 5 MG PO TABS: 5.0000 mg | ORAL_TABLET | Freq: Every day | ORAL | 1 refills | Status: DC

## 2022-10-16 NOTE — Patient Instructions (Signed)
FAX NUMBER to give to [facility] [phone]

## 2022-10-16 NOTE — Telephone Encounter (Signed)
I met with the patient when she was in the clinic today to discuss applying for disability.   She does not think an application has ever been submitted to [facility] and was agreeable to submitting an application to [facility] for assistance with applying.   Completed referral then sent to [facility] via secure email

## 2022-10-16 NOTE — Progress Notes (Signed)
Assessment & Plan:  Angel Brewer was seen today for hypertension.  Diagnoses and all orders for this visit:  Essential hypertension -     amLODipine (NORVASC) 5 MG tablet; Take 1 tablet (5 mg total) by mouth daily. -     CMP14+EGFR  Chronic anticoagulation -     apixaban (ELIQUIS) 5 MG TABS tablet; Take 1 tablet (5 mg total) by mouth 2 (two) times daily.  Recurrent pulmonary emboli -     apixaban (ELIQUIS) 5 MG TABS tablet; Take 1 tablet (5 mg total) by mouth 2 (two) times daily.  Hypercholesterolemia -     atorvastatin (LIPITOR) 20 MG tablet; Take 1 tablet (20 mg total) by mouth daily.  Chronic diastolic CHF (congestive heart failure) -     dapagliflozin propanediol (FARXIGA) 10 MG TABS tablet; Take 1 tablet (10 mg total) by mouth daily. -     spironolactone (ALDACTONE) 25 MG tablet; Take 1 tablet (25 mg total) by mouth daily. -     Ambulatory referral to Cardiology  Encounter for Papanicolaou smear for cervical cancer screening -     Ambulatory referral to Gynecology  Anemia, unspecified type -     CBC with Differential    Patient has been counseled on age-appropriate routine health concerns for screening and prevention. These are reviewed and up-to-date. Referrals have been placed accordingly. Immunizations are up-to-date or declined.    Subjective:   Chief Complaint  Patient presents with   Hypertension   HPI Angel Brewer 57 y.o. female presents to office today for follow up to HTN I have not seen her since August 2023.   Patient has been counseled on age-appropriate routine health concerns for screening and prevention. These are reviewed and up-to-date. Referrals have been placed accordingly. Immunizations are up-to-date or declined.     MAMMOGRAM: OVERDUE. Ordered today COLONOSCOPY: Overdue. Referral placed PAP SMEAR: OVERDUE. Referral placed.   She has a history of acute on chronic respiratory failure (O2 3L at night) , tracheostomy, severe OSA refractory to  NIPPV, Morbid Obesity, HTN, HPL, PE/DVT (chronic anticoagulation), chronic HF (needs follow up with cardiology)  PER PULMONOLOGY Continues to tolerate Passy-Muir valve well. Using nocturnal oxygen at night. She takes out the Passy-Muir valve and attaches oxygen at night. Janina Mayo is uncapped at night.   Interested in disability. She is trying to continue to work but due to body habitus she is retaining fluid with prolonged standing which makes it also difficult to ambulate. Not interested in bariatric surgery.    HTN Blood pressure is well controlled with amlodipine and spironolactone.  BP Readings from Last 3 Encounters:  10/16/22 120/69  06/24/22 (!) 157/74  02/24/22 117/81    Lab Results  Component Value Date   HGBA1C 5.4 06/10/2016    Lab Results  Component Value Date   LDLCALC 154 (H) 09/07/2020    Review of Systems  Constitutional:  Negative for fever, malaise/fatigue and weight loss.  HENT: Negative.  Negative for nosebleeds.   Eyes: Negative.  Negative for blurred vision, double vision and photophobia.  Respiratory:  Positive for shortness of breath. Negative for cough and wheezing.   Cardiovascular:  Positive for leg swelling. Negative for chest pain and palpitations.  Gastrointestinal: Negative.  Negative for heartburn, nausea and vomiting.  Musculoskeletal: Negative.  Negative for myalgias.  Neurological: Negative.  Negative for dizziness, focal weakness, seizures and headaches.  Psychiatric/Behavioral: Negative.  Negative for suicidal ideas.     Past Medical History:  Diagnosis Date  Chronic diastolic CHF (congestive heart failure) 01/05/2022   DVT (deep vein thrombosis) in pregnancy    RLE DVT 02/2016   Dyspnea    Essential hypertension 08/25/2017   Hyperlipidemia 11/21/2021   Morbid obesity    Obesity hypoventilation syndrome 05/03/2021   PE (pulmonary embolism) 02/29/2016   Sleep apnea    uses a cpap   Tracheostomy present 01/05/2022   Size 6  shiley/cuffless  Last changed: 10/9     Discussion  Stable trach.  She does get a [place] more short of breath with exertion.  Although I did not observe it on my exam she does endorse she will occasionally have a rush of air after removing PMV following activity.  This could suggest some mild air trapping from the trach.  Otherwise she is doing fantastic with the trach     Plan  Cont routin    Past Surgical History:  Procedure Laterality Date   CESAREAN SECTION     x 3   OPEN REDUCTION INTERNAL FIXATION (ORIF) DISTAL RADIAL FRACTURE Right 06/20/2019   Procedure: OPEN REDUCTION INTERNAL FIXATION (ORIF) DISTAL RADIAL FRACTURE;  Surgeon: Mack Hook, MD;  [facility];  Service: Orthopedics;  Laterality: Right;  REGIONAL BLOCK TOTAL SURGERY TIME: 90 MINUTES   SYNOVECTOMY Right 11/21/2019   Procedure: RIGHT WRIST LIGAMENT RECONSTRUCTION;  Surgeon: Mack Hook, MD;  [facility];  Service: Orthopedics;  Laterality: Right;  PROCEDURE: RIGHT WRIST LIGAMENT RECONSTRUCTION LENGTH OF SURGERY: 105 MIN    Family History  Problem Relation Age of Onset   Hypertension Sister     Social History Reviewed with no changes to be made today.   Outpatient Medications Prior to Visit  Medication Sig Dispense Refill   acetaminophen (TYLENOL) 160 MG/5ML liquid Take 18.8 mLs (600 mg total) by mouth every 4 (four) hours as needed for fever. 120 mL 0   albuterol (VENTOLIN HFA) 108 (90 Base) MCG/ACT inhaler Inhale 1-2 puffs into the lungs every 6 (six) hours as needed for wheezing or shortness of breath. 54 g 1   furosemide (LASIX) 20 MG tablet Take 1 tablet (20 mg total) by mouth daily. 90 tablet 0   Misc. Devices MISC Please supply patient with tracheostomy humidifier  Z93.0 1 each 0   PRESCRIPTION MEDICATION 1 each by Other route at bedtime. CPAP- At bedtime     amLODipine (NORVASC) 5 MG tablet TAKE 1 TABLET BY MOUTH EVERY DAY 30 tablet 0   apixaban (ELIQUIS) 5 MG TABS tablet Take 1 tablet (5 mg  total) by mouth 2 (two) times daily. 180 tablet 3   atorvastatin (LIPITOR) 20 MG tablet Take 1 tablet (20 mg total) by mouth daily. 90 tablet 1   dapagliflozin propanediol (FARXIGA) 10 MG TABS tablet Take 1 tablet (10 mg total) by mouth daily. 90 tablet 0   spironolactone (ALDACTONE) 25 MG tablet TAKE 1 TABLET (25 MG TOTAL) BY MOUTH DAILY. 30 tablet 0   topiramate (TOPAMAX) 25 MG tablet Take 25 mg by mouth daily.     No facility-administered medications prior to visit.    No Known Allergies     Objective:    BP 120/69   Pulse (!) 59   Ht 5\' 6"  (1.676 m)   Wt (!) 384 lb 6.4 oz (174.4 kg)   LMP 11/29/2015   SpO2 92%   BMI 62.04 kg/m  Wt Readings from Last 3 Encounters:  10/16/22 (!) 384 lb 6.4 oz (174.4 kg)  06/24/22 (!) 373 lb (169.2 kg)  02/24/22 (!) 404 lb 9.6 oz (183.5 kg)    Physical Exam Vitals and nursing note reviewed.  Constitutional:      Appearance: She is well-developed.  HENT:     Head: Normocephalic and atraumatic.  Neck:     Trachea: Tracheostomy present. No tracheal tenderness, abnormal tracheal secretions or tracheal deviation.  Cardiovascular:     Rate and Rhythm: Normal rate and regular rhythm.     Heart sounds: Normal heart sounds. No murmur heard.    No friction rub. No gallop.  Pulmonary:     Effort: Pulmonary effort is normal. No tachypnea or respiratory distress.     Breath sounds: Normal breath sounds. No decreased breath sounds, wheezing, rhonchi or rales.  Chest:     Chest wall: No tenderness.  Abdominal:     General: Bowel sounds are normal.     Palpations: Abdomen is soft.  Musculoskeletal:        General: Normal range of motion.     Cervical back: Normal range of motion.  Skin:    General: Skin is warm and dry.  Neurological:     Mental Status: She is alert and oriented to person, place, and time.     Coordination: Coordination normal.  Psychiatric:        Behavior: Behavior normal. Behavior is cooperative.        Thought Content:  Thought content normal.        Judgment: Judgment normal.          Patient has been counseled extensively about nutrition and exercise as well as the importance of adherence with medications and regular follow-up. The patient was given clear instructions to go to ER or return to medical center if symptoms don't improve, worsen or new problems develop. The patient verbalized understanding.   Follow-up: Return in about 3 months (around 01/15/2023).   Claiborne Rigg, FNP-BC [facility] [place], [place] [phone]   10/20/2022, 9:15 PM

## 2022-10-17 DIAGNOSIS — Z930 Tracheostomy status: Secondary | ICD-10-CM | POA: Diagnosis not present

## 2022-10-17 LAB — CMP14+EGFR
ALT: 7 IU/L (ref 0–32)
AST: 13 IU/L (ref 0–40)
Albumin/Globulin Ratio: 1.3 (ref 1.2–2.2)
Albumin: 4.3 g/dL (ref 3.8–4.9)
Alkaline Phosphatase: 74 IU/L (ref 44–121)
BUN/Creatinine Ratio: 16 (ref 9–23)
BUN: 17 mg/dL (ref 6–24)
Bilirubin Total: 0.6 mg/dL (ref 0.0–1.2)
CO2: 24 mmol/L (ref 20–29)
Calcium: 9.6 mg/dL (ref 8.7–10.2)
Chloride: 100 mmol/L (ref 96–106)
Creatinine, Ser: 1.04 mg/dL — ABNORMAL HIGH (ref 0.57–1.00)
Globulin, Total: 3.2 g/dL (ref 1.5–4.5)
Glucose: 73 mg/dL (ref 70–99)
Potassium: 4.5 mmol/L (ref 3.5–5.2)
Sodium: 140 mmol/L (ref 134–144)
Total Protein: 7.5 g/dL (ref 6.0–8.5)
eGFR: 63 mL/min/{1.73_m2} (ref >59)

## 2022-10-17 LAB — CBC WITH DIFFERENTIAL/PLATELET
Basophils Absolute: 0.1 10*3/uL (ref 0.0–0.2)
Basos: 1 %
EOS (ABSOLUTE): 0.2 10*3/uL (ref 0.0–0.4)
Eos: 4 %
Hematocrit: 39.2 % (ref 34.0–46.6)
Hemoglobin: 12.7 g/dL (ref 11.1–15.9)
Immature Grans (Abs): 0 10*3/uL (ref 0.0–0.1)
Immature Granulocytes: 0 %
Lymphocytes Absolute: 1.3 10*3/uL (ref 0.7–3.1)
Lymphs: 29 %
MCH: 30 pg (ref 26.6–33.0)
MCHC: 32.4 g/dL (ref 31.5–35.7)
MCV: 93 fL (ref 79–97)
Monocytes Absolute: 0.4 10*3/uL (ref 0.1–0.9)
Monocytes: 8 %
Neutrophils Absolute: 2.6 10*3/uL (ref 1.4–7.0)
Neutrophils: 58 %
Platelets: 228 10*3/uL (ref 150–450)
RBC: 4.23 x10E6/uL (ref 3.77–5.28)
RDW: 13.1 % (ref 11.7–15.4)
WBC: 4.5 10*3/uL (ref 3.4–10.8)

## 2022-10-20 ENCOUNTER — Encounter: Payer: Self-pay | Admitting: Nurse Practitioner

## 2022-10-30 ENCOUNTER — Inpatient Hospital Stay (HOSPITAL_COMMUNITY)
Admission: RE | Admit: 2022-10-30 | Discharge: 2022-10-30 | Disposition: A | Discharge status: Home / Self Care | Payer: Medicaid Other | Source: Ambulatory Visit

## 2022-10-31 ENCOUNTER — Telehealth: Payer: Self-pay

## 2022-10-31 NOTE — Telephone Encounter (Signed)
Copied from CRM [id number]. Topic: General - Other >> Oct 31, 2022  9:56 AM Carrielelia G wrote: Reason for CRM: please send over all office note discussing oxygen

## 2022-11-03 NOTE — Telephone Encounter (Signed)
Noted faxed

## 2022-11-13 ENCOUNTER — Ambulatory Visit (HOSPITAL_COMMUNITY)
Admission: RE | Admit: 2022-11-13 | Discharge: 2022-11-13 | Disposition: A | Discharge status: Home / Self Care | Payer: Medicaid Other | Source: Ambulatory Visit | Attending: Acute Care | Admitting: Acute Care

## 2022-11-13 DIAGNOSIS — E66813 Obesity, class 3: Secondary | ICD-10-CM

## 2022-11-13 DIAGNOSIS — Z930 Tracheostomy status: Secondary | ICD-10-CM

## 2022-11-13 DIAGNOSIS — G4733 Obstructive sleep apnea (adult) (pediatric): Secondary | ICD-10-CM | POA: Diagnosis present

## 2022-11-13 DIAGNOSIS — J9611 Chronic respiratory failure with hypoxia: Secondary | ICD-10-CM | POA: Diagnosis present

## 2022-11-13 DIAGNOSIS — E662 Morbid (severe) obesity with alveolar hypoventilation: Secondary | ICD-10-CM | POA: Diagnosis present

## 2022-11-13 DIAGNOSIS — J9610 Chronic respiratory failure, unspecified whether with hypoxia or hypercapnia: Secondary | ICD-10-CM | POA: Diagnosis present

## 2022-11-13 DIAGNOSIS — Z6841 Body Mass Index (BMI) 40.0 and over, adult: Secondary | ICD-10-CM

## 2022-11-13 NOTE — Progress Notes (Addendum)
Reason for visit.  Trach care   HPI 74 yof known by our service. Last seen in hospital by our service back in June 2023. Her trach was placed May 2023. She was last seen in pulm office 7/31. Since her trach she has not required nocturnal ventilation and her activity tolerance has improved. She was actually supposed to come here after her last hospitalization but somehow that got dropped. She presents today for planned trach change  Review of Systems  Constitutional: Negative.   HENT:  Positive for congestion.        Sinus congestion 2/2 allergies   Eyes: Negative.   Respiratory: Negative.    Cardiovascular: Negative.   Gastrointestinal: Negative.   Genitourinary: Negative.   Skin: Negative.     Exam   General this is a 57 year old female. She is ambulatory and in no distress HENT #6 Cuffless trach. Excellent phonation. Does have rush of air w/ removal of PMV Pulm clear Card rrr Abd soft Ext warm and dry  Neuro intact   Procedure  The existing tracheostomy was removed, site inspected, exam noted above.  A new tracheostomy was inserted over obturator.  Size 6 cuffless trach inserted without difficulty.  Placement verified by end-tidal CO2. Pt tolerated well   Impression/plan   Active Problems:   Tracheostomy dependent (HCC)   Class 3 severe obesity with serious comorbidity and body mass index (BMI) of 60.0 to 69.9 in adult [facility])   Chronic respiratory failure (HCC)   OSA (obstructive sleep apnea)   Obesity hypoventilation syndrome (HCC)   Tracheostomy dependent (HCC) Overview: Size 6 shiley/cuffless Last changed:5/2 (12/12 was date of prior change)   Discussion Stable trach  Not candidate for decannualation   Plan Cont routine trach care PMV as tolerated  ROV 3 mo.    Simonne Martinet ACNP-BC [facility] Pulmonary/Critical Care Pager # [phone] OR # [phone] if no answer     My time 15 minutes Simonne Martinet ACNP-BC [facility] Pulmonary/Critical Care Pager #  [phone] OR # [phone] if no answer

## 2022-11-13 NOTE — Progress Notes (Signed)
Tracheostomy Procedure Note  Angel Brewer [id number] 10-18-1965  Pre Procedure Tracheostomy Information  Trach Brand: Shiley Size:  6.0  [id number] Style: Uncuffed Secured by: Velcro   Procedure: Trach Cleaning and Trach Change    Post Procedure Tracheostomy Information  Trach Brand: Shiley Size:  6.0 [id number] Style: Uncuffed Secured by: Velcro   Post Procedure Evaluation:  ETCO2 positive color change from yellow to purple : Yes.   Vital signs:VSS Patients current condition: stable Complications: No apparent complications Trach site exam: clean, dry Wound care done: cleaned 4X4 drain gaize applied Patient did tolerate procedure well.   Education: none  Prescription needs: none    Additional needs: New PMV given to patient at this visit

## 2022-11-19 DIAGNOSIS — J9690 Respiratory failure, unspecified, unspecified whether with hypoxia or hypercapnia: Secondary | ICD-10-CM | POA: Diagnosis not present

## 2022-11-24 ENCOUNTER — Telehealth: Payer: Self-pay

## 2022-11-24 NOTE — Telephone Encounter (Signed)
Copied from CRM [id number]. Topic: General - Inquiry >> Nov 24, 2022 12:32 PM Marlow Baars wrote: Reason for CRM: John with [employer] is faxing over an addended version of a request he sent last month. This request is for the patients oxygen re certification. He needs the form to be filled out and signed and sent back. Please assist further as he will be faxing over today.

## 2022-11-25 NOTE — Telephone Encounter (Signed)
No fax has been received.

## 2022-11-27 ENCOUNTER — Ambulatory Visit: Payer: Medicaid Other | Attending: Interventional Cardiology | Admitting: Interventional Cardiology

## 2022-11-27 ENCOUNTER — Encounter: Payer: Self-pay | Admitting: Interventional Cardiology

## 2022-11-27 VITALS — BP 108/72 | HR 67 | Ht 66.0 in | Wt 374.2 lb

## 2022-11-27 DIAGNOSIS — I10 Essential (primary) hypertension: Secondary | ICD-10-CM

## 2022-11-27 DIAGNOSIS — E782 Mixed hyperlipidemia: Secondary | ICD-10-CM | POA: Diagnosis not present

## 2022-11-27 DIAGNOSIS — J9610 Chronic respiratory failure, unspecified whether with hypoxia or hypercapnia: Secondary | ICD-10-CM

## 2022-11-27 DIAGNOSIS — I5032 Chronic diastolic (congestive) heart failure: Secondary | ICD-10-CM

## 2022-11-27 NOTE — Patient Instructions (Signed)
Medication Instructions:  Your physician recommends that you continue on your current medications as directed. Please refer to the Current Medication list given to you today.  *If you need a refill on your cardiac medications before your next appointment, please call your pharmacy*   Lab Work: none If you have labs (blood work) drawn today and your tests are completely normal, you will receive your results only by: MyChart Message (if you have MyChart) OR A paper copy in the mail If you have any lab test that is abnormal or we need to change your treatment, we will call you to review the results.   Testing/Procedures: none   Follow-Up: At [facility], you and your health needs are our priority.  As part of our continuing mission to provide you with exceptional heart care, we have created designated Provider Care Teams.  These Care Teams include your primary Cardiologist (physician) and Advanced Practice Providers (APPs -  Physician Assistants and Nurse Practitioners) who all work together to provide you with the care you need, when you need it.  We recommend signing up for the patient portal called "MyChart".  Sign up information is provided on this After Visit Summary.  MyChart is used to connect with patients for Virtual Visits (Telemedicine).  Patients are able to view lab/test results, encounter notes, upcoming appointments, etc.  Non-urgent messages can be sent to your provider as well.   To learn more about what you can do with MyChart, go to [email or web address].    Your next appointment:   12 month(s)  Provider:   Jayadeep Varanasi, MD        

## 2022-11-27 NOTE — Progress Notes (Signed)
Cardiology Office Note   Date:  11/27/2022   ID:  Angel Brewer, DOB 1965-07-25, MRN [id number]  PCP:  Claiborne Rigg, NP    No chief complaint on file.    Wt Readings from Last 3 Encounters:  11/27/22 (!) 374 lb 3.2 oz (169.7 kg)  10/16/22 (!) 384 lb 6.4 oz (174.4 kg)  06/24/22 (!) 373 lb (169.2 kg)       History of Present Illness: Angel Brewer is a 57 y.o. female who is being seen today for the evaluation of chronic diastolic heart failure at the request of Claiborne Rigg, NP.   H/o obesity hypoventilation. PMH severe morbid obesity, chronic respiratory failure status post tracheostomy   Echo in 5/23 showed: "Left ventricular ejection fraction, by estimation, is 60 to 65%. The  left ventricle has normal function. The left ventricle has no regional  wall motion abnormalities. There is mild left ventricular hypertrophy.  Indeterminate diastolic filling due to  E-A fusion.   2. Right ventricular systolic function is normal. The right ventricular  size is normal.   3. The mitral valve is normal in structure. No evidence of mitral valve  regurgitation.   4. The aortic valve is grossly normal. Aortic valve regurgitation is not  visualized.   5. Aortic no significant aortic aneurysm.   6. The inferior vena cava is normal in size with greater than 50%  respiratory variability, suggesting right atrial pressure of 3 mmHg. "  Denies : Chest pain. Dizziness. Leg edema. Nitroglycerin use. Orthopnea. Palpitations. Paroxysmal nocturnal dyspnea.  Syncope.     Past Medical History:  Diagnosis Date   Chronic diastolic CHF (congestive heart failure) (HCC) 01/05/2022   DVT (deep vein thrombosis) in pregnancy    RLE DVT 02/2016   Dyspnea    Essential hypertension 08/25/2017   Hyperlipidemia 11/21/2021   Morbid obesity (HCC)    Obesity hypoventilation syndrome (HCC) 05/03/2021   PE (pulmonary embolism) 02/29/2016   Sleep apnea    uses a cpap   Tracheostomy present  (HCC) 01/05/2022   Size 6 shiley/cuffless  Last changed: 10/9     Discussion  Stable trach.  She does get a Whang more short of breath with exertion.  Although I did not observe it on my exam she does endorse she will occasionally have a rush of air after removing PMV following activity.  This could suggest some mild air trapping from the trach.  Otherwise she is doing fantastic with the trach     Plan  Cont routin    Past Surgical History:  Procedure Laterality Date   CESAREAN SECTION     x 3   OPEN REDUCTION INTERNAL FIXATION (ORIF) DISTAL RADIAL FRACTURE Right 06/20/2019   Procedure: OPEN REDUCTION INTERNAL FIXATION (ORIF) DISTAL RADIAL FRACTURE;  Surgeon: Mack Hook, MD;  Location: [facility] OR;  Service: Orthopedics;  Laterality: Right;  REGIONAL BLOCK TOTAL SURGERY TIME: 90 MINUTES   SYNOVECTOMY Right 11/21/2019   Procedure: RIGHT WRIST LIGAMENT RECONSTRUCTION;  Surgeon: Mack Hook, MD;  Location[facility] OR;  Service: Orthopedics;  Laterality: Right;  PROCEDURE: RIGHT WRIST LIGAMENT RECONSTRUCTION LENGTH OF SURGERY: 105 MIN     Current Outpatient Medications  Medication Sig Dispense Refill   acetaminophen (TYLENOL) 160 MG/5ML liquid Take 18.8 mLs (600 mg total) by mouth every 4 (four) hours as needed for fever. 120 mL 0   albuterol (VENTOLIN HFA) 108 (90 Base) MCG/ACT inhaler Inhale 1-2 puffs into the lungs every 6 (six) hours as needed  for wheezing or shortness of breath. 54 g 1   amLODipine (NORVASC) 5 MG tablet Take 1 tablet (5 mg total) by mouth daily. 90 tablet 1   apixaban (ELIQUIS) 5 MG TABS tablet Take 1 tablet (5 mg total) by mouth 2 (two) times daily. 180 tablet 3   atorvastatin (LIPITOR) 20 MG tablet Take 1 tablet (20 mg total) by mouth daily. 90 tablet 1   dapagliflozin propanediol (FARXIGA) 10 MG TABS tablet Take 1 tablet (10 mg total) by mouth daily. 90 tablet 1   furosemide (LASIX) 20 MG tablet Take 1 tablet (20 mg total) by mouth daily. 90 tablet 0   Misc. Devices MISC  Please supply patient with tracheostomy humidifier  Z93.0 1 each 0   PRESCRIPTION MEDICATION 1 each by Other route at bedtime. CPAP- At bedtime     spironolactone (ALDACTONE) 25 MG tablet Take 1 tablet (25 mg total) by mouth daily. 90 tablet 1   No current facility-administered medications for this visit.    Allergies:   Patient has no known allergies.    Social History:  The patient  reports that she has quit smoking. She has never used smokeless tobacco. She reports that she does not drink alcohol and does not use drugs.   Family History:  The patient's family history includes Hypertension in her sister.    ROS:  Please see the history of present illness.   Otherwise, review of systems are positive for leg swelling when she eats more salt.   All other systems are reviewed and negative.    PHYSICAL EXAM: VS:  BP 108/72   Pulse 67   Ht 5\' 6"  (1.676 m)   Wt (!) 374 lb 3.2 oz (169.7 kg)   LMP 11/29/2015   SpO2 93%   BMI 60.40 kg/m  , BMI Body mass index is 60.4 kg/m. GEN: Well nourished, well developed, in no acute distress HEENT: Tracheostomy tube in place Neck: no JVD, carotid bruits, or masses Cardiac: RRR; no murmurs, rubs, or gallops,no edema  Respiratory:  clear to auscultation bilaterally, normal work of breathing GI: soft, nontender, nondistended, + BS, obese MS: no deformity or atrophy Skin: warm and dry, no rash Neuro:  Strength and sensation are intact Psych: euthymic mood, full affect   EKG:   The ekg ordered 12/23 demonstrates sinus rhythm   Recent Labs: 06/16/2022: B Natriuretic Peptide 25.0 06/17/2022: Magnesium 2.2 10/16/2022: ALT 7; BUN 17; Creatinine, Ser 1.04; Hemoglobin 12.7; Platelets 228; Potassium 4.5; Sodium 140   Lipid Panel    Component Value Date/Time   CHOL 212 (H) 09/07/2020 0900   TRIG 47 11/27/2021 0414   HDL 43 09/07/2020 0900   CHOLHDL 4.9 (H) 09/07/2020 0900   LDLCALC 154 (H) 09/07/2020 0900     Other studies Reviewed: Additional  studies/ records that were reviewed today with results demonstrating: Creatinine 1.0.   ASSESSMENT AND PLAN:  Chronic diastolic heart failure: Appears euvolemic.  Continue current regimen of diuretics.  Avoid excessive salt.  Avoid processed foods.  She does take bottles but does not eat any of the food there.  No change in any medications at this point as her fluid status and her blood pressure.  Well-controlled. HTN: The current medical regimen is effective;  continue present plan and medications.  Hyperlipidemia: Continue atorvastatin.  LDL 154 in February 2022.  This will need to be rechecked at some point. Morbid obesity: Whole food, plant-based diet.  High-fiber diet.  Avoid processed foods. Chronic respiratory failure:  Mechanical ventilation at night.  Has been stable for some time.   Current medicines are reviewed at length with the patient today.  The patient concerns regarding her medicines were addressed.  The following changes have been made:  No change  Labs/ tests ordered today include:  No orders of the defined types were placed in this encounter.   Recommend 150 minutes/week of aerobic exercise Low fat, low carb, high fiber diet recommended  Disposition:   FU in 1 year   Signed, Lance Muss, MD  11/27/2022 10:44 AM    [facility] [address] [place], [place], [place]  [address] Phone: [phone]; Fax: [phone]

## 2022-12-04 NOTE — Telephone Encounter (Signed)
Call fro [employer], she states she will refax the request for recertification, Oxygen medical necessity today fof this pt.

## 2022-12-05 NOTE — Telephone Encounter (Signed)
Will provide provider with orders and fax when signed.

## 2022-12-10 NOTE — Telephone Encounter (Signed)
[employer] is aware that order reverification needs to come from Ms. Carollo's pulmonologist.

## 2022-12-10 NOTE — Telephone Encounter (Signed)
May from [employer] company called in checking status of signed order for medical necessity.  Their fx is, [phone].

## 2022-12-15 ENCOUNTER — Telehealth: Payer: Self-pay

## 2022-12-15 NOTE — Telephone Encounter (Signed)
Sent 2 messages via text prior to calling pt to confirm upcoming appointment. Left message for pt to call and confirm appointment by next day to avoid cancellation of appt.   

## 2022-12-18 ENCOUNTER — Encounter: Payer: Medicaid Other | Admitting: Family Medicine

## 2022-12-18 ENCOUNTER — Encounter: Payer: Self-pay | Admitting: Family Medicine

## 2022-12-18 NOTE — Progress Notes (Signed)
Patient did not keep appointment today. She may call to reschedule.  

## 2022-12-19 ENCOUNTER — Other Ambulatory Visit: Payer: Self-pay | Admitting: Nurse Practitioner

## 2022-12-19 DIAGNOSIS — I5032 Chronic diastolic (congestive) heart failure: Secondary | ICD-10-CM

## 2022-12-19 MED ORDER — FUROSEMIDE 20 MG PO TABS: 20.0000 mg | ORAL_TABLET | Freq: Every day | ORAL | 0 refills | Status: DC

## 2022-12-19 NOTE — Telephone Encounter (Signed)
Requested Prescriptions  Pending Prescriptions Disp Refills   furosemide (LASIX) 20 MG tablet 90 tablet 0    Sig: Take 1 tablet (20 mg total) by mouth daily.     Cardiovascular:  Diuretics - Loop Failed - 12/19/2022  5:47 PM      Failed - Cr in normal range and within 180 days    Creatinine  Date Value Ref Range Status  11/14/2019 0.81 0.44 - 1.00 mg/dL Final   Creat  Date Value Ref Range Status  09/09/2016 0.96 0.50 - 1.05 mg/dL Final    Comment:      For patients > or = 57 years of age: The upper reference limit for Creatinine is approximately 13% higher for people identified as African-American.      Creatinine, Ser  Date Value Ref Range Status  10/16/2022 1.04 (H) 0.57 - 1.00 mg/dL Final   Creatinine, POC  Date Value Ref Range Status  02/16/2017 300 mg/dL Final   Creatinine, Urine  Date Value Ref Range Status  11/23/2019 169.75 mg/dL Final    Comment:    Performed at [facility], [address] [place], [place] [address]         Failed - Mg Level in normal range and within 180 days    Magnesium  Date Value Ref Range Status  06/17/2022 2.2 1.7 - 2.4 mg/dL Final    Comment:    Performed at [facility], [address], [place], [place] [address]         Passed - K in normal range and within 180 days    Potassium  Date Value Ref Range Status  10/16/2022 4.5 3.5 - 5.2 mmol/L Final         Passed - Ca in normal range and within 180 days    Calcium  Date Value Ref Range Status  10/16/2022 9.6 8.7 - 10.2 mg/dL Final   Calcium, Ion  Date Value Ref Range Status  06/16/2022 1.15 1.15 - 1.40 mmol/L Final         Passed - Na in normal range and within 180 days    Sodium  Date Value Ref Range Status  10/16/2022 140 134 - 144 mmol/L Final         Passed - Cl in normal range and within 180 days    Chloride  Date Value Ref Range Status  10/16/2022 100 96 - 106 mmol/L Final         Passed - Last BP in normal range    BP Readings from Last 1  Encounters:  11/27/22 108/72         Passed - Valid encounter within last 6 months    Recent Outpatient Visits           2 months ago Essential hypertension   [place] [facility] Claiborne Rigg, NP   9 months ago Hospital discharge follow-up   [facility] Claiborne Rigg, NP   1 year ago Recurrent pulmonary emboli [facility])   [place] [facility] Claiborne Rigg, NP   2 years ago Hospital discharge follow-up   [facility] [place], [place] W, NP   2 years ago Chronic deep vein thrombosis (DVT) of femoral vein of left lower extremity [facility])   [facility] Storm Frisk, MD       Future Appointments  In 1 month Claiborne Rigg, NP [facility]             dapagliflozin propanediol (FARXIGA) 10 MG TABS tablet 90 tablet 1    Sig: Take 1 tablet (10 mg total) by mouth daily.     Endocrinology:  Diabetes - SGLT2 Inhibitors Failed - 12/19/2022  5:47 PM      Failed - Cr in normal range and within 360 days    Creatinine  Date Value Ref Range Status  11/14/2019 0.81 0.44 - 1.00 mg/dL Final   Creat  Date Value Ref Range Status  09/09/2016 0.96 0.50 - 1.05 mg/dL Final    Comment:      For patients > or = 58 years of age: The upper reference limit for Creatinine is approximately 13% higher for people identified as African-American.      Creatinine, Ser  Date Value Ref Range Status  10/16/2022 1.04 (H) 0.57 - 1.00 mg/dL Final   Creatinine, POC  Date Value Ref Range Status  02/16/2017 300 mg/dL Final   Creatinine, Urine  Date Value Ref Range Status  11/23/2019 169.75 mg/dL Final    Comment:    Performed at [facility], [address] [place], [place] [address]         Failed - HBA1C is between 0 and 7.9 and within 180 days    Hemoglobin A1C  Date Value Ref  Range Status  06/10/2016 5.4  Final         Passed - eGFR in normal range and within 360 days    GFR, Est African American  Date Value Ref Range Status  09/09/2016 79 >=60 mL/min Final   GFR, Est AFR Am  Date Value Ref Range Status  11/14/2019 >60 >60 mL/min Final   GFR calc Af Amer  Date Value Ref Range Status  09/07/2020 68 >59 mL/min/1.73 Final    Comment:    **In accordance with recommendations from the NKF-ASN Task force,**   [facility] is in the process of updating its eGFR calculation to the   2021 CKD-EPI creatinine equation that estimates kidney function   without a race variable.    GFR, Est Non African American  Date Value Ref Range Status  09/09/2016 69 >=60 mL/min Final   GFR, Estimated  Date Value Ref Range Status  06/23/2022 >60 >60 mL/min Final    Comment:    (NOTE) Calculated using the CKD-EPI Creatinine Equation (2021)   11/14/2019 >60 >60 mL/min Final   eGFR  Date Value Ref Range Status  10/16/2022 63 >59 mL/min/1.73 Final         Passed - Valid encounter within last 6 months    Recent Outpatient Visits           2 months ago Essential hypertension   [place] [facility] Claiborne Rigg, NP   9 months ago Hospital discharge follow-up   [facility] Claiborne Rigg, NP   1 year ago Recurrent pulmonary emboli [facility])   [facility] Claiborne Rigg, NP   2 years ago Hospital discharge follow-up   [facility] [place], [place] W, NP   2 years ago Chronic deep vein thrombosis (DVT) of femoral vein of left lower extremity [facility])   [facility] Storm Frisk, MD  Future Appointments             In 1 month Claiborne Rigg, NP [facility]

## 2022-12-19 NOTE — Telephone Encounter (Signed)
Medication Refill - Medication: furosemide (LASIX) 20 MG tablet , dapagliflozin propanediol (FARXIGA) 10 MG TABS tablet  ,   Has the patient contacted their pharmacy? Yes.     Preferred Pharmacy (with phone number or street name):  [facility] [phone] [place], [place] - [address] Phone: [phone]  Fax: [phone]      Has the patient been seen for an appointment in the last year OR does the patient have an upcoming appointment? Yes.    Please assist patient further

## 2022-12-20 DIAGNOSIS — J9690 Respiratory failure, unspecified, unspecified whether with hypoxia or hypercapnia: Secondary | ICD-10-CM | POA: Diagnosis not present

## 2022-12-29 ENCOUNTER — Other Ambulatory Visit: Payer: Self-pay | Admitting: Nurse Practitioner

## 2022-12-29 DIAGNOSIS — I5032 Chronic diastolic (congestive) heart failure: Secondary | ICD-10-CM

## 2022-12-29 NOTE — Telephone Encounter (Signed)
Medication Refill - Medication: dapagliflozin propanediol (FARXIGA) 10 MG TABS tablet [[id number]]   Has the patient contacted their pharmacy? Yes.   (Agent: If no, request that the patient contact the pharmacy for the refill. If patient does not wish to contact the pharmacy document the reason why and proceed with request.) (Agent: If yes, when and what did the pharmacy advise?)  Preferred Pharmacy (with phone number or street name):  [facility] [phone] [place], [place] - [address] Phone: [phone]  Fax: [phone]     Has the patient been seen for an appointment in the last year OR does the patient have an upcoming appointment? Yes.    Agent: Please be advised that RX refills may take up to 3 business days. We ask that you follow-up with your pharmacy.

## 2022-12-30 NOTE — Telephone Encounter (Signed)
Unable to refill per protocol, Rx request is too soon 10/16/22 for 90 and 1 refill.E-Prescribing Status: Receipt confirmed by pharmacy (10/16/2022  9:51 AM EDT).  Requested Prescriptions  Pending Prescriptions Disp Refills   dapagliflozin propanediol (FARXIGA) 10 MG TABS tablet 90 tablet 1    Sig: Take 1 tablet (10 mg total) by mouth daily.     Endocrinology:  Diabetes - SGLT2 Inhibitors Failed - 12/29/2022  5:18 PM      Failed - Cr in normal range and within 360 days    Creatinine  Date Value Ref Range Status  11/14/2019 0.81 0.44 - 1.00 mg/dL Final   Creat  Date Value Ref Range Status  09/09/2016 0.96 0.50 - 1.05 mg/dL Final    Comment:      For patients > or = 57 years of age: The upper reference limit for Creatinine is approximately 13% higher for people identified as African-American.      Creatinine, Ser  Date Value Ref Range Status  10/16/2022 1.04 (H) 0.57 - 1.00 mg/dL Final   Creatinine, POC  Date Value Ref Range Status  02/16/2017 300 mg/dL Final   Creatinine, Urine  Date Value Ref Range Status  11/23/2019 169.75 mg/dL Final    Comment:    Performed at [facility], [address] [place], [place] [address]         Failed - HBA1C is between 0 and 7.9 and within 180 days    Hemoglobin A1C  Date Value Ref Range Status  06/10/2016 5.4  Final         Passed - eGFR in normal range and within 360 days    GFR, Est African American  Date Value Ref Range Status  09/09/2016 79 >=60 mL/min Final   GFR, Est AFR Am  Date Value Ref Range Status  11/14/2019 >60 >60 mL/min Final   GFR calc Af Amer  Date Value Ref Range Status  09/07/2020 68 >59 mL/min/1.73 Final    Comment:    **In accordance with recommendations from the NKF-ASN Task force,**   [facility] is in the process of updating its eGFR calculation to the   2021 CKD-EPI creatinine equation that estimates kidney function   without a race variable.    GFR, Est Non African American  Date Value Ref Range  Status  09/09/2016 69 >=60 mL/min Final   GFR, Estimated  Date Value Ref Range Status  06/23/2022 >60 >60 mL/min Final    Comment:    (NOTE) Calculated using the CKD-EPI Creatinine Equation (2021)   11/14/2019 >60 >60 mL/min Final   eGFR  Date Value Ref Range Status  10/16/2022 63 >59 mL/min/1.73 Final         Passed - Valid encounter within last 6 months    Recent Outpatient Visits           2 months ago Essential hypertension   [place] [facility] Claiborne Rigg, NP   10 months ago Hospital discharge follow-up   [facility] Cla[facility]orne Rigg, NP   1 year ago Recurrent pulmonary emboli [facility])   [facility] Claiborne Rigg, NP   2 years ago Hospital discharge follow-up   [facility] [place], [place] W, NP   2 years ago Chronic deep vein thrombosis (DVT) of femoral vein of left lower extremity [facility])   [facility] [place]  Storm Frisk, MD       Future Appointments             In 3 weeks Claiborne Rigg, NP [place] [facility] [place]

## 2023-01-19 DIAGNOSIS — J9690 Respiratory failure, unspecified, unspecified whether with hypoxia or hypercapnia: Secondary | ICD-10-CM | POA: Diagnosis not present

## 2023-01-26 ENCOUNTER — Ambulatory Visit: Payer: Medicaid Other | Admitting: Nurse Practitioner

## 2023-02-19 DIAGNOSIS — J9690 Respiratory failure, unspecified, unspecified whether with hypoxia or hypercapnia: Secondary | ICD-10-CM | POA: Diagnosis not present

## 2023-03-05 ENCOUNTER — Other Ambulatory Visit: Payer: Self-pay | Admitting: Nurse Practitioner

## 2023-03-05 DIAGNOSIS — I5032 Chronic diastolic (congestive) heart failure: Secondary | ICD-10-CM

## 2023-03-19 ENCOUNTER — Telehealth: Payer: Self-pay | Admitting: *Deleted

## 2023-03-19 NOTE — Patient Outreach (Signed)
  Care Management   Note  03/19/2023 Name: Angel Brewer MRN: [id number] DOB: [birth year]  Angel Brewer is enrolled in a Managed Medicaid plan: Yes. Outreach attempt today was successful.   The patient was given information about care management services as a benefit of their Medicaid health plan today.   Patient                                              did not agree to enrollment in care management services and does not wish to consider at this time.   No further follow up required:    Estanislado Emms RN, BSN [place]  [facility] RN Care Coordinator [phone]

## 2023-03-22 DIAGNOSIS — J9690 Respiratory failure, unspecified, unspecified whether with hypoxia or hypercapnia: Secondary | ICD-10-CM | POA: Diagnosis not present

## 2023-03-25 DIAGNOSIS — Z930 Tracheostomy status: Secondary | ICD-10-CM | POA: Diagnosis not present

## 2023-04-14 ENCOUNTER — Ambulatory Visit: Payer: Self-pay

## 2023-04-14 NOTE — Telephone Encounter (Signed)
  Chief Complaint: Leg swelling more than usual. Pt states that she seems to be more easily winded Symptoms: Above Frequency: 2 months Pertinent Negatives: Patient denies SOB, chest pain facial swelling Disposition: [] ED /[] Urgent Care (no appt availability in office) / [] Appointment(In office/virtual)/ []  [facility]/ [] Home Care/ [] Refused Recommended Disposition /[x]  Mobile Bus/ []  Follow-up with PCP Additional Notes: Pt states that her legs are always swollen, but have become more so over the past 2 months. Pt states she seems more easily winded as well. No appts in office pt will go to [facility] tomorrow.   Reason for Disposition  [1] MODERATE leg swelling (e.g., swelling extends up to knees) AND [2] new-onset or worsening  Answer Assessment - Initial Assessment Questions 1. ONSET: "When did the swelling start?" (e.g., minutes, hours, days)     2 months 2. LOCATION: "What part of the leg is swollen?"  "Are both legs swollen or just one leg?"     Both 3. SEVERITY: "How bad is the swelling?" (e.g., localized; mild, moderate, severe)   - Localized: Small area of swelling localized to one leg.   - MILD pedal edema: Swelling limited to foot and ankle, pitting edema < 1/4 inch (6 mm) deep, rest and elevation eliminate most or all swelling.   - MODERATE edema: Swelling of lower leg to knee, pitting edema > 1/4 inch (6 mm) deep, rest and elevation only partially reduce swelling.   - SEVERE edema: Swelling extends above knee, facial or hand swelling present.      severe 4. REDNESS: "Does the swelling look red or infected?"     no 5. PAIN: "Is the swelling painful to touch?" If Yes, ask: "How painful is it?"   (Scale 1-10; mild, moderate or severe)     no 6. FEVER: "Do you have a fever?" If Yes, ask: "What is it, how was it measured, and when did it start?"      no 7. CAUSE: "What do you think is causing the leg swelling?"     no 8. MEDICAL HISTORY: "Do you have a history of  blood clots (e.g., DVT), cancer, heart failure, kidney disease, or liver failure?"     Trach, heart 9. RECURRENT SYMPTOM: "Have you had leg swelling before?" If Yes, ask: "When was the last time?" "What happened that time?"     Swollen for awhile  - now much worse 10. OTHER SYMPTOMS: "Do you have any other symptoms?" (e.g., chest pain, difficulty breathing)       Easily winded  Protocols used: Leg Swelling and Edema-A-AH

## 2023-04-15 NOTE — Telephone Encounter (Signed)
Noted  

## 2023-04-21 DIAGNOSIS — J9690 Respiratory failure, unspecified, unspecified whether with hypoxia or hypercapnia: Secondary | ICD-10-CM | POA: Diagnosis not present

## 2023-05-20 ENCOUNTER — Ambulatory Visit: Payer: Medicaid Other | Admitting: Nurse Practitioner

## 2023-05-20 ENCOUNTER — Ambulatory Visit (HOSPITAL_COMMUNITY)
Admission: RE | Admit: 2023-05-20 | Discharge: 2023-05-20 | Disposition: A | Discharge status: Home / Self Care | Payer: Medicaid Other | Source: Ambulatory Visit | Attending: Acute Care | Admitting: Acute Care

## 2023-05-20 DIAGNOSIS — G4733 Obstructive sleep apnea (adult) (pediatric): Secondary | ICD-10-CM | POA: Diagnosis present

## 2023-05-20 DIAGNOSIS — Z930 Tracheostomy status: Secondary | ICD-10-CM

## 2023-05-20 DIAGNOSIS — J9611 Chronic respiratory failure with hypoxia: Secondary | ICD-10-CM | POA: Diagnosis present

## 2023-05-20 DIAGNOSIS — J449 Chronic obstructive pulmonary disease, unspecified: Secondary | ICD-10-CM | POA: Diagnosis present

## 2023-05-20 DIAGNOSIS — E662 Morbid (severe) obesity with alveolar hypoventilation: Secondary | ICD-10-CM | POA: Diagnosis not present

## 2023-05-20 DIAGNOSIS — J9610 Chronic respiratory failure, unspecified whether with hypoxia or hypercapnia: Secondary | ICD-10-CM | POA: Diagnosis not present

## 2023-05-20 DIAGNOSIS — Z430 Encounter for attention to tracheostomy: Secondary | ICD-10-CM | POA: Diagnosis not present

## 2023-05-20 NOTE — Progress Notes (Signed)
Reason for visit.  Trach care   HPI 50 yof known by our service. Last seen in hospital by our service back in June 2023. Her trach was placed May 2023. She was last seen in pulm office 7/31. Since her trach she has not required nocturnal ventilation and her activity tolerance has improved.I last saw her May this year.   Review of Systems  Constitutional: Negative.   All other systems reviewed and are negative.   Exam   General ambulatory into the trach clinic tolerated ambulation well w/ pacing HENT NCAT has 6 cuffless shiley in place. Excellent phonation w/ PMV no rush of air when PMV removed Pulm clear Card rrr Abd soft Ext no sig edema  Neuro intact and at baseline    Procedure  The existing tracheostomy was removed, site inspected, exam noted above. There was a bit of biofilm on the trach (d/t extended period between change)  A new tracheostomy was inserted over obturator.  Size 6 cuffless trach inserted without difficulty.  Placement verified by end-tidal CO2. Pt tolerated well.   Impression/plan   Active Problems:   Chronic respiratory failure (HCC)   OSA (obstructive sleep apnea)   Obesity hypoventilation syndrome (HCC)   COPD (chronic obstructive pulmonary disease) (HCC)   Tracheostomy dependent (HCC) Overview: Size 6 shiley/cuffless Last changed:11/6 (5/2 was date of prior change)   Discussion Stable trach  Not candidate for decannualation   Plan Cont routine trach care PMV as tolerated  ROV 3 mo.    Simonne Martinet ACNP-BC [facility] Pulmonary/Critical Care Pager # [phone] OR # [phone] if no answer      My time 15 minutes Simonne Martinet ACNP-BC [facility] Pulmonary/Critical Care Pager # [phone] OR # [phone] if no answer

## 2023-05-20 NOTE — Progress Notes (Signed)
Tracheostomy Procedure Note  Angel Brewer [id number] 07-18-1965  Pre Procedure Tracheostomy Information  Trach Brand: Shiley Size:  6.0   MV78I Style: Uncuffed Secured by: Velcro   Procedure: Trach Cleaning and Trach Change    Post Procedure Tracheostomy Information  Trach Brand: Shiley Size:  6.0  ON62X Style: Uncuffed Secured by: Velcro   Post Procedure Evaluation:  ETCO2 positive color change from yellow to purple : Yes.   Vital signs:VSS Patients current condition: stable Complications: No apparent complications Trach site exam: clean, dry Wound care done: 4 x 4 gauze drain applied Patient did tolerate procedure well.   Education: none  Prescription needs: none    Additional needs: New PMV issued at this clinic visit

## 2023-05-22 DIAGNOSIS — J9690 Respiratory failure, unspecified, unspecified whether with hypoxia or hypercapnia: Secondary | ICD-10-CM | POA: Diagnosis not present

## 2023-06-05 DIAGNOSIS — Z930 Tracheostomy status: Secondary | ICD-10-CM | POA: Diagnosis not present

## 2023-06-17 ENCOUNTER — Encounter: Payer: Self-pay | Admitting: Nurse Practitioner

## 2023-06-17 ENCOUNTER — Ambulatory Visit: Payer: Medicaid Other | Attending: Nurse Practitioner | Admitting: Nurse Practitioner

## 2023-06-17 VITALS — BP 130/77 | HR 56 | Ht 66.0 in | Wt >= 6400 oz

## 2023-06-17 DIAGNOSIS — Z7901 Long term (current) use of anticoagulants: Secondary | ICD-10-CM | POA: Diagnosis not present

## 2023-06-17 DIAGNOSIS — E7800 Pure hypercholesterolemia, unspecified: Secondary | ICD-10-CM | POA: Diagnosis not present

## 2023-06-17 DIAGNOSIS — I5032 Chronic diastolic (congestive) heart failure: Secondary | ICD-10-CM

## 2023-06-17 DIAGNOSIS — Z86711 Personal history of pulmonary embolism: Secondary | ICD-10-CM | POA: Diagnosis not present

## 2023-06-17 DIAGNOSIS — I890 Lymphedema, not elsewhere classified: Secondary | ICD-10-CM

## 2023-06-17 DIAGNOSIS — L089 Local infection of the skin and subcutaneous tissue, unspecified: Secondary | ICD-10-CM | POA: Diagnosis not present

## 2023-06-17 DIAGNOSIS — I10 Essential (primary) hypertension: Secondary | ICD-10-CM

## 2023-06-17 DIAGNOSIS — I2699 Other pulmonary embolism without acute cor pulmonale: Secondary | ICD-10-CM | POA: Diagnosis not present

## 2023-06-17 MED ORDER — APIXABAN 5 MG PO TABS: 5.0000 mg | ORAL_TABLET | Freq: Two times a day (BID) | ORAL | 3 refills | Status: AC

## 2023-06-17 MED ORDER — FUROSEMIDE 20 MG PO TABS: 20.0000 mg | ORAL_TABLET | Freq: Every day | ORAL | 1 refills | Status: DC

## 2023-06-17 MED ORDER — SPIRONOLACTONE 25 MG PO TABS: 25.0000 mg | ORAL_TABLET | Freq: Every day | ORAL | 1 refills | Status: DC

## 2023-06-17 MED ORDER — MUPIROCIN 2 % EX OINT: 1.0000 | TOPICAL_OINTMENT | Freq: Two times a day (BID) | CUTANEOUS | 1 refills | Status: DC

## 2023-06-17 MED ORDER — DAPAGLIFLOZIN PROPANEDIOL 10 MG PO TABS: 10.0000 mg | ORAL_TABLET | Freq: Every day | ORAL | 1 refills | Status: DC

## 2023-06-17 MED ORDER — AMLODIPINE BESYLATE 5 MG PO TABS: 5.0000 mg | ORAL_TABLET | Freq: Every day | ORAL | 1 refills | Status: DC

## 2023-06-17 MED ORDER — SULFAMETHOXAZOLE-TRIMETHOPRIM 800-160 MG PO TABS: 1.0000 | ORAL_TABLET | Freq: Two times a day (BID) | ORAL | 0 refills | Status: AC

## 2023-06-17 MED ORDER — ATORVASTATIN CALCIUM 20 MG PO TABS: 20.0000 mg | ORAL_TABLET | Freq: Every day | ORAL | 1 refills | Status: DC

## 2023-06-17 NOTE — Progress Notes (Signed)
Assessment & Plan:  There are no diagnoses linked to this encounter.  Patient has been counseled on age-appropriate routine health concerns for screening and prevention. These are reviewed and up-to-date. Referrals have been placed accordingly. Immunizations are up-to-date or declined.    Subjective:  No chief complaint on file.   Angel Brewer 57 y.o. female presents to office today    Increased BLE swelling.  Working at [employer].  Left leg/thigh much larger than the right thigh.  She has a history of lymphedema  ROS  Past Medical History:  Diagnosis Date   Chronic diastolic CHF (congestive heart failure) (HCC) 01/05/2022   DVT (deep vein thrombosis) in pregnancy    RLE DVT 02/2016   Dyspnea    Essential hypertension 08/25/2017   Hyperlipidemia 11/21/2021   Morbid obesity (HCC)    Obesity hypoventilation syndrome (HCC) 05/03/2021   PE (pulmonary embolism) 02/29/2016   Sleep apnea    uses a cpap   Tracheostomy present (HCC) 01/05/2022   Size 6 shiley/cuffless  Last changed: 10/9     Discussion  Stable trach.  She does get a [place] more short of breath with exertion.  Although I did not observe it on my exam she does endorse she will occasionally have a rush of air after removing PMV following activity.  This could suggest some mild air trapping from the trach.  Otherwise she is doing fantastic with the trach     Plan  Cont routin    Past Surgical History:  Procedure Laterality Date   CESAREAN SECTION     x 3   OPEN REDUCTION INTERNAL FIXATION (ORIF) DISTAL RADIAL FRACTURE Right 06/20/2019   Procedure: OPEN REDUCTION INTERNAL FIXATION (ORIF) DISTAL RADIAL FRACTURE;  Surgeon: Mack Hook, MD;  Location[facility] OR;  Service: Orthopedics;  Laterality: Right;  REGIONAL BLOCK TOTAL SURGERY TIME: 90 MINUTES   SYNOVECTOMY Right 11/21/2019   Procedure: RIGHT WRIST LIGAMENT RECONSTRUCTION;  Surgeon: Mack Hook, MD;  Location: [facility] OR;  Service: Orthopedics;  Laterality: Right;   PROCEDURE: RIGHT WRIST LIGAMENT RECONSTRUCTION LENGTH OF SURGERY: 105 MIN    Family History  Problem Relation Age of Onset   Hypertension Sister     Social History Reviewed with no changes to be made today.   Outpatient Medications Prior to Visit  Medication Sig Dispense Refill   acetaminophen (TYLENOL) 160 MG/5ML liquid Take 18.8 mLs (600 mg total) by mouth every 4 (four) hours as needed for fever. 120 mL 0   albuterol (VENTOLIN HFA) 108 (90 Base) MCG/ACT inhaler Inhale 1-2 puffs into the lungs every 6 (six) hours as needed for wheezing or shortness of breath. 54 g 1   amLODipine (NORVASC) 5 MG tablet Take 1 tablet (5 mg total) by mouth daily. 90 tablet 1   apixaban (ELIQUIS) 5 MG TABS tablet Take 1 tablet (5 mg total) by mouth 2 (two) times daily. 180 tablet 3   atorvastatin (LIPITOR) 20 MG tablet Take 1 tablet (20 mg total) by mouth daily. 90 tablet 1   dapagliflozin propanediol (FARXIGA) 10 MG TABS tablet Take 1 tablet (10 mg total) by mouth daily. 90 tablet 1   furosemide (LASIX) 20 MG tablet TAKE 1 TABLET BY MOUTH EVERY DAY 90 tablet 0   Misc. Devices MISC Please supply patient with tracheostomy humidifier  Z93.0 1 each 0   PRESCRIPTION MEDICATION 1 each by Other route at bedtime. CPAP- At bedtime     spironolactone (ALDACTONE) 25 MG tablet Take 1 tablet (25 mg total)  by mouth daily. 90 tablet 1   No facility-administered medications prior to visit.    No Known Allergies     Objective:    Ht 5\' 6"  (1.676 m)   LMP 11/29/2015   BMI 60.40 kg/m  Wt Readings from Last 3 Encounters:  11/27/22 (!) 374 lb 3.2 oz (169.7 kg)  10/16/22 (!) 384 lb 6.4 oz (174.4 kg)  06/24/22 (!) 373 lb (169.2 kg)    Physical Exam       Patient has been counseled extensively about nutrition and exercise as well as the importance of adherence with medications and regular follow-up. The patient was given clear instructions to go to ER or return to medical center if symptoms don't improve, worsen  or new problems develop. The patient verbalized understanding.   Follow-up: No follow-ups on file.   Claiborne Rigg, FNP-BC [facility] [place], [place] [phone]   06/17/2023, 10:13 AM

## 2023-06-17 NOTE — Progress Notes (Signed)
Assessment & Plan:  Angel Brewer was seen today for sore throat, ankle injury and leg swelling.  Diagnoses and all orders for this visit:  Essential hypertension -     amLODipine (NORVASC) 5 MG tablet; Take 1 tablet (5 mg total) by mouth daily.  Chronic diastolic CHF (congestive heart failure) (HCC) -     furosemide (LASIX) 20 MG tablet; Take 1 tablet (20 mg total) by mouth daily. Take 40 mg or 2 tablets for the next 5 days then resume 20 mg or 1 tablet by mouth daily by mouth. -     spironolactone (ALDACTONE) 25 MG tablet; Take 1 tablet (25 mg total) by mouth daily. -     dapagliflozin propanediol (FARXIGA) 10 MG TABS tablet; Take 1 tablet (10 mg total) by mouth daily. -     CMP14+EGFR  Hypercholesterolemia -     atorvastatin (LIPITOR) 20 MG tablet; Take 1 tablet (20 mg total) by mouth daily. -     Lipid panel  Chronic anticoagulation -     apixaban (ELIQUIS) 5 MG TABS tablet; Take 1 tablet (5 mg total) by mouth 2 (two) times daily.  History of pulmonary embolism -     apixaban (ELIQUIS) 5 MG TABS tablet; Take 1 tablet (5 mg total) by mouth 2 (two) times daily. -     CBC with Differential  Lymphedema -     Ambulatory referral to Physical Therapy  Skin infection -     mupirocin ointment (BACTROBAN) 2 %; Apply 1 Application topically 2 (two) times daily. Apply to lower right leg/ankle -     sulfamethoxazole-trimethoprim (BACTRIM DS) 800-160 MG tablet; Take 1 tablet by mouth 2 (two) times daily for 7 days. For ankle cellulitis    Patient has been counseled on age-appropriate routine health concerns for screening and prevention. These are reviewed and up-to-date. Referrals have been placed accordingly. Immunizations are up-to-date or declined.    Subjective:   Chief Complaint  Patient presents with   Sore Throat    For about a week   Ankle Injury    Right inner ankle wound   Leg Swelling    Patient is retaining fluid on her legs    Angel Brewer 57 y.o. female presents  to office today with complaints of increased BLE edema, sore throat and sore on right inner ankle.   Patient has been counseled on age-appropriate routine health concerns for screening and prevention. These are reviewed and up-to-date. Referrals have been placed accordingly. Immunizations are up-to-date or declined.     MAMMOGRAM: OVERDUE. Ordered today COLONOSCOPY: Overdue. Referral placed PAP SMEAR: OVERDUE. Referral placed.   She has a history of acute on chronic respiratory failure (O2 3L at night) , tracheostomy, severe OSA refractory to NIPPV, Morbid Obesity, HTN, HPL, PE/DVT (chronic anticoagulation), chronic HF (needs follow up with cardiology)   Edema: Patient complains of edema. The location of the edema is generalized.  The edema has been mild and moderate.  Onset of symptoms was several weeks ago, unchanged since that time. The edema is present all day.  The swelling has been aggravated by nothing, relieved by nothing, and been associated with diagnosis of heart failure, weight gain, and lymphedema . Cardiac risk factors include dyslipidemia, hypertension, and obesity (BMI >= 30 kg/m2).   She has a small area of skin ulceration near the medial mallelous of the right foot. States it started off as a small sore and has not healed after several months. The area around  the ulcer is warm and painful to touch. Will treat with abx today and if no improvement may need to be evaluated for possible venous insufficiency.    She has severe lymphedema L >R and mostly in the upper left leg.   Endorses pharyngitis symptoms of right sided sore throat. She believes she may sleep with her mouth open due to being trached and she also reports using heat at night recently with the change in temperature.   Blood pressure is well controlled.  BP Readings from Last 3 Encounters:  06/17/23 130/77  11/27/22 108/72  10/16/22 120/69     Review of Systems  Constitutional:  Negative for fever,  malaise/fatigue and weight loss.  HENT:  Positive for sore throat. Negative for nosebleeds.   Eyes: Negative.  Negative for blurred vision, double vision and photophobia.  Respiratory: Negative.  Negative for cough and shortness of breath.   Cardiovascular:  Positive for leg swelling. Negative for chest pain and palpitations.  Gastrointestinal: Negative.  Negative for heartburn, nausea and vomiting.  Musculoskeletal: Negative.  Negative for myalgias.  Neurological: Negative.  Negative for dizziness, focal weakness, seizures and headaches.  Psychiatric/Behavioral: Negative.  Negative for suicidal ideas.     Past Medical History:  Diagnosis Date   Chronic diastolic CHF (congestive heart failure) (HCC) 01/05/2022   DVT (deep vein thrombosis) in pregnancy    RLE DVT 02/2016   Dyspnea    Essential hypertension 08/25/2017   Hyperlipidemia 11/21/2021   Morbid obesity (HCC)    Obesity hypoventilation syndrome (HCC) 05/03/2021   PE (pulmonary embolism) 02/29/2016   Sleep apnea    uses a cpap   Tracheostomy present (HCC) 01/05/2022   Size 6 shiley/cuffless  Last changed: 10/9     Discussion  Stable trach.  She does get a [place] more short of breath with exertion.  Although I did not observe it on my exam she does endorse she will occasionally have a rush of air after removing PMV following activity.  This could suggest some mild air trapping from the trach.  Otherwise she is doing fantastic with the trach     Plan  Cont routin    Past Surgical History:  Procedure Laterality Date   CESAREAN SECTION     x 3   OPEN REDUCTION INTERNAL FIXATION (ORIF) DISTAL RADIAL FRACTURE Right 06/20/2019   Procedure: OPEN REDUCTION INTERNAL FIXATION (ORIF) DISTAL RADIAL FRACTURE;  Surgeon: Mack Hook, MD;  Location: [facility] OR;  Service: Orthopedics;  Laterality: Right;  REGIONAL BLOCK TOTAL SURGERY TIME: 90 MINUTES   SYNOVECTOMY Right 11/21/2019   Procedure: RIGHT WRIST LIGAMENT RECONSTRUCTION;  Surgeon:  Mack Hook, MD;  Location: [facility] OR;  Service: Orthopedics;  Laterality: Right;  PROCEDURE: RIGHT WRIST LIGAMENT RECONSTRUCTION LENGTH OF SURGERY: 105 MIN    Family History  Problem Relation Age of Onset   Hypertension Sister     Social History Reviewed with no changes to be made today.   Outpatient Medications Prior to Visit  Medication Sig Dispense Refill   acetaminophen (TYLENOL) 160 MG/5ML liquid Take 18.8 mLs (600 mg total) by mouth every 4 (four) hours as needed for fever. 120 mL 0   albuterol (VENTOLIN HFA) 108 (90 Base) MCG/ACT inhaler Inhale 1-2 puffs into the lungs every 6 (six) hours as needed for wheezing or shortness of breath. 54 g 1   Misc. Devices MISC Please supply patient with tracheostomy humidifier  Z93.0 1 each 0   PRESCRIPTION MEDICATION 1 each by Other route at bedtime.  CPAP- At bedtime     amLODipine (NORVASC) 5 MG tablet Take 1 tablet (5 mg total) by mouth daily. 90 tablet 1   apixaban (ELIQUIS) 5 MG TABS tablet Take 1 tablet (5 mg total) by mouth 2 (two) times daily. 180 tablet 3   atorvastatin (LIPITOR) 20 MG tablet Take 1 tablet (20 mg total) by mouth daily. 90 tablet 1   dapagliflozin propanediol (FARXIGA) 10 MG TABS tablet Take 1 tablet (10 mg total) by mouth daily. 90 tablet 1   furosemide (LASIX) 20 MG tablet TAKE 1 TABLET BY MOUTH EVERY DAY 90 tablet 0   spironolactone (ALDACTONE) 25 MG tablet Take 1 tablet (25 mg total) by mouth daily. 90 tablet 1   No facility-administered medications prior to visit.    No Known Allergies     Objective:    BP 130/77 (BP Location: Right Arm, Patient Position: Sitting, Cuff Size: Large)   Pulse (!) 56   Ht 5\' 6"  (1.676 m)   Wt (!) 409 lb (185.5 kg)   LMP 11/29/2015   SpO2 93%   BMI 66.01 kg/m  Wt Readings from Last 3 Encounters:  06/17/23 (!) 409 lb (185.5 kg)  11/27/22 (!) 374 lb 3.2 oz (169.7 kg)  10/16/22 (!) 384 lb 6.4 oz (174.4 kg)    Physical Exam Vitals and nursing note reviewed.   Constitutional:      Appearance: She is well-developed.  HENT:     Head: Normocephalic and atraumatic.  Cardiovascular:     Rate and Rhythm: Normal rate and regular rhythm.     Heart sounds: Normal heart sounds. No murmur heard.    No friction rub. No gallop.  Pulmonary:     Effort: Pulmonary effort is normal. No tachypnea or respiratory distress.     Breath sounds: Normal breath sounds. No decreased breath sounds, wheezing, rhonchi or rales.  Chest:     Chest wall: No tenderness.  Abdominal:     General: Bowel sounds are normal.     Palpations: Abdomen is soft.  Musculoskeletal:        General: Normal range of motion.     Cervical back: Normal range of motion.     Right lower leg: Edema present.     Left lower leg: Edema present.     Right ankle: Tenderness present over the medial malleolus. No lateral malleolus tenderness.       Legs:     Comments: Area of eschar and ulceration approximately 0.25cm in diameter. No purulent drainage or erythema however this is swelling and tenderness present with palpation.   Skin:    General: Skin is warm and dry.  Neurological:     Mental Status: She is alert and oriented to person, place, and time.     Coordination: Coordination normal.  Psychiatric:        Behavior: Behavior normal. Behavior is cooperative.        Thought Content: Thought content normal.        Judgment: Judgment normal.          Patient has been counseled extensively about nutrition and exercise as well as the importance of adherence with medications and regular follow-up. The patient was given clear instructions to go to ER or return to medical center if symptoms don't improve, worsen or new problems develop. The patient verbalized understanding.   Follow-up: Return in about 3 months (around 09/15/2023).   Claiborne Rigg, FNP-BC [facility] [place], [place]  [phone]   06/17/2023, 12:12 PM

## 2023-06-18 LAB — CMP14+EGFR
ALT: 7 [IU]/L (ref 0–32)
AST: 14 [IU]/L (ref 0–40)
Albumin: 4.1 g/dL (ref 3.8–4.9)
Alkaline Phosphatase: 83 [IU]/L (ref 44–121)
BUN/Creatinine Ratio: 14 (ref 9–23)
BUN: 13 mg/dL (ref 6–24)
Bilirubin Total: 0.7 mg/dL (ref 0.0–1.2)
CO2: 29 mmol/L (ref 20–29)
Calcium: 9.2 mg/dL (ref 8.7–10.2)
Chloride: 102 mmol/L (ref 96–106)
Creatinine, Ser: 0.93 mg/dL (ref 0.57–1.00)
Globulin, Total: 3.7 g/dL (ref 1.5–4.5)
Glucose: 79 mg/dL (ref 70–99)
Potassium: 4.5 mmol/L (ref 3.5–5.2)
Sodium: 143 mmol/L (ref 134–144)
Total Protein: 7.8 g/dL (ref 6.0–8.5)
eGFR: 72 mL/min/{1.73_m2} (ref >59)

## 2023-06-18 LAB — CBC WITH DIFFERENTIAL/PLATELET
Basophils Absolute: 0.1 10*3/uL (ref 0.0–0.2)
Basos: 1 %
EOS (ABSOLUTE): 0.2 10*3/uL (ref 0.0–0.4)
Eos: 4 %
Hematocrit: 42.3 % (ref 34.0–46.6)
Hemoglobin: 13.3 g/dL (ref 11.1–15.9)
Immature Grans (Abs): 0 10*3/uL (ref 0.0–0.1)
Immature Granulocytes: 1 %
Lymphocytes Absolute: 1.3 10*3/uL (ref 0.7–3.1)
Lymphs: 28 %
MCH: 29.8 pg (ref 26.6–33.0)
MCHC: 31.4 g/dL — ABNORMAL LOW (ref 31.5–35.7)
MCV: 95 fL (ref 79–97)
Monocytes Absolute: 0.4 10*3/uL (ref 0.1–0.9)
Monocytes: 9 %
Neutrophils Absolute: 2.6 10*3/uL (ref 1.4–7.0)
Neutrophils: 57 %
Platelets: 239 10*3/uL (ref 150–450)
RBC: 4.47 x10E6/uL (ref 3.77–5.28)
RDW: 12.5 % (ref 11.7–15.4)
WBC: 4.6 10*3/uL (ref 3.4–10.8)

## 2023-06-18 LAB — LIPID PANEL
Chol/HDL Ratio: 3.5 {ratio} (ref 0.0–4.4)
Cholesterol, Total: 159 mg/dL (ref 100–199)
HDL: 46 mg/dL (ref >39)
LDL Chol Calc (NIH): 102 mg/dL — ABNORMAL HIGH (ref 0–99)
Triglycerides: 54 mg/dL (ref 0–149)
VLDL Cholesterol Cal: 11 mg/dL (ref 5–40)

## 2023-06-21 DIAGNOSIS — J9690 Respiratory failure, unspecified, unspecified whether with hypoxia or hypercapnia: Secondary | ICD-10-CM | POA: Diagnosis not present

## 2023-07-22 DIAGNOSIS — J9690 Respiratory failure, unspecified, unspecified whether with hypoxia or hypercapnia: Secondary | ICD-10-CM | POA: Diagnosis not present

## 2023-08-22 DIAGNOSIS — J9690 Respiratory failure, unspecified, unspecified whether with hypoxia or hypercapnia: Secondary | ICD-10-CM | POA: Diagnosis not present

## 2023-09-02 ENCOUNTER — Emergency Department (HOSPITAL_COMMUNITY): Payer: Medicaid Other

## 2023-09-02 ENCOUNTER — Encounter (HOSPITAL_COMMUNITY): Payer: Self-pay

## 2023-09-02 ENCOUNTER — Inpatient Hospital Stay (HOSPITAL_COMMUNITY)
Admission: EM | Admit: 2023-09-02 | Discharge: 2023-09-11 | DRG: 207 | Disposition: A | Discharge status: Home / Self Care | Payer: Medicaid Other | Attending: Family Medicine | Admitting: Family Medicine

## 2023-09-02 ENCOUNTER — Other Ambulatory Visit: Payer: Self-pay

## 2023-09-02 DIAGNOSIS — Z86711 Personal history of pulmonary embolism: Secondary | ICD-10-CM

## 2023-09-02 DIAGNOSIS — R939 Diagnostic imaging inconclusive due to excess body fat of patient: Secondary | ICD-10-CM | POA: Diagnosis not present

## 2023-09-02 DIAGNOSIS — E16A1 Hypoglycemia level 1: Secondary | ICD-10-CM | POA: Diagnosis not present

## 2023-09-02 DIAGNOSIS — Z87891 Personal history of nicotine dependence: Secondary | ICD-10-CM

## 2023-09-02 DIAGNOSIS — Z8249 Family history of ischemic heart disease and other diseases of the circulatory system: Secondary | ICD-10-CM

## 2023-09-02 DIAGNOSIS — Z86718 Personal history of other venous thrombosis and embolism: Secondary | ICD-10-CM

## 2023-09-02 DIAGNOSIS — N179 Acute kidney failure, unspecified: Secondary | ICD-10-CM | POA: Diagnosis present

## 2023-09-02 DIAGNOSIS — E785 Hyperlipidemia, unspecified: Secondary | ICD-10-CM | POA: Diagnosis present

## 2023-09-02 DIAGNOSIS — Z930 Tracheostomy status: Secondary | ICD-10-CM

## 2023-09-02 DIAGNOSIS — J189 Pneumonia, unspecified organism: Secondary | ICD-10-CM | POA: Diagnosis present

## 2023-09-02 DIAGNOSIS — E66813 Obesity, class 3: Secondary | ICD-10-CM | POA: Diagnosis not present

## 2023-09-02 DIAGNOSIS — Z79899 Other long term (current) drug therapy: Secondary | ICD-10-CM | POA: Diagnosis not present

## 2023-09-02 DIAGNOSIS — G4733 Obstructive sleep apnea (adult) (pediatric): Secondary | ICD-10-CM | POA: Diagnosis not present

## 2023-09-02 DIAGNOSIS — Z7901 Long term (current) use of anticoagulants: Secondary | ICD-10-CM | POA: Diagnosis not present

## 2023-09-02 DIAGNOSIS — E662 Morbid (severe) obesity with alveolar hypoventilation: Secondary | ICD-10-CM | POA: Diagnosis present

## 2023-09-02 DIAGNOSIS — Z7984 Long term (current) use of oral hypoglycemic drugs: Secondary | ICD-10-CM | POA: Diagnosis not present

## 2023-09-02 DIAGNOSIS — Z1152 Encounter for screening for COVID-19: Secondary | ICD-10-CM | POA: Diagnosis not present

## 2023-09-02 DIAGNOSIS — J9602 Acute respiratory failure with hypercapnia: Secondary | ICD-10-CM

## 2023-09-02 DIAGNOSIS — J9601 Acute respiratory failure with hypoxia: Principal | ICD-10-CM | POA: Diagnosis present

## 2023-09-02 DIAGNOSIS — J90 Pleural effusion, not elsewhere classified: Secondary | ICD-10-CM | POA: Diagnosis not present

## 2023-09-02 DIAGNOSIS — R918 Other nonspecific abnormal finding of lung field: Secondary | ICD-10-CM | POA: Diagnosis not present

## 2023-09-02 DIAGNOSIS — I2781 Cor pulmonale (chronic): Secondary | ICD-10-CM | POA: Diagnosis not present

## 2023-09-02 DIAGNOSIS — J9690 Respiratory failure, unspecified, unspecified whether with hypoxia or hypercapnia: Secondary | ICD-10-CM | POA: Diagnosis not present

## 2023-09-02 DIAGNOSIS — R230 Cyanosis: Secondary | ICD-10-CM | POA: Diagnosis not present

## 2023-09-02 DIAGNOSIS — J811 Chronic pulmonary edema: Secondary | ICD-10-CM | POA: Diagnosis not present

## 2023-09-02 DIAGNOSIS — R0689 Other abnormalities of breathing: Secondary | ICD-10-CM

## 2023-09-02 DIAGNOSIS — I110 Hypertensive heart disease with heart failure: Secondary | ICD-10-CM | POA: Diagnosis present

## 2023-09-02 DIAGNOSIS — J9621 Acute and chronic respiratory failure with hypoxia: Principal | ICD-10-CM | POA: Diagnosis present

## 2023-09-02 DIAGNOSIS — R000 Tachycardia, unspecified: Secondary | ICD-10-CM | POA: Diagnosis not present

## 2023-09-02 DIAGNOSIS — J441 Chronic obstructive pulmonary disease with (acute) exacerbation: Secondary | ICD-10-CM

## 2023-09-02 DIAGNOSIS — J984 Other disorders of lung: Secondary | ICD-10-CM | POA: Diagnosis not present

## 2023-09-02 DIAGNOSIS — K8020 Calculus of gallbladder without cholecystitis without obstruction: Secondary | ICD-10-CM | POA: Diagnosis not present

## 2023-09-02 DIAGNOSIS — J449 Chronic obstructive pulmonary disease, unspecified: Secondary | ICD-10-CM | POA: Diagnosis not present

## 2023-09-02 DIAGNOSIS — R0602 Shortness of breath: Secondary | ICD-10-CM | POA: Diagnosis not present

## 2023-09-02 DIAGNOSIS — R590 Localized enlarged lymph nodes: Secondary | ICD-10-CM | POA: Diagnosis not present

## 2023-09-02 DIAGNOSIS — J9622 Acute and chronic respiratory failure with hypercapnia: Secondary | ICD-10-CM | POA: Diagnosis present

## 2023-09-02 DIAGNOSIS — E8729 Other acidosis: Secondary | ICD-10-CM | POA: Diagnosis not present

## 2023-09-02 DIAGNOSIS — J9600 Acute respiratory failure, unspecified whether with hypoxia or hypercapnia: Secondary | ICD-10-CM | POA: Diagnosis present

## 2023-09-02 DIAGNOSIS — I5033 Acute on chronic diastolic (congestive) heart failure: Secondary | ICD-10-CM | POA: Diagnosis not present

## 2023-09-02 DIAGNOSIS — Z6841 Body Mass Index (BMI) 40.0 and over, adult: Secondary | ICD-10-CM

## 2023-09-02 DIAGNOSIS — R0902 Hypoxemia: Secondary | ICD-10-CM | POA: Diagnosis not present

## 2023-09-02 DIAGNOSIS — I517 Cardiomegaly: Secondary | ICD-10-CM | POA: Diagnosis not present

## 2023-09-02 LAB — COMPREHENSIVE METABOLIC PANEL
ALT: 7 U/L (ref 0–44)
AST: 18 U/L (ref 15–41)
Albumin: 3.5 g/dL (ref 3.5–5.0)
Alkaline Phosphatase: 60 U/L (ref 38–126)
Anion gap: 11 (ref 5–15)
BUN: 11 mg/dL (ref 6–20)
CO2: 30 mmol/L (ref 22–32)
Calcium: 9.2 mg/dL (ref 8.9–10.3)
Chloride: 101 mmol/L (ref 98–111)
Creatinine, Ser: 1.15 mg/dL — ABNORMAL HIGH (ref 0.44–1.00)
GFR, Estimated: 55 mL/min — ABNORMAL LOW (ref >60)
Glucose, Bld: 101 mg/dL — ABNORMAL HIGH (ref 70–99)
Potassium: 4.7 mmol/L (ref 3.5–5.1)
Sodium: 142 mmol/L (ref 135–145)
Total Bilirubin: 1 mg/dL (ref 0.0–1.2)
Total Protein: 8.2 g/dL — ABNORMAL HIGH (ref 6.5–8.1)

## 2023-09-02 LAB — I-STAT ARTERIAL BLOOD GAS, ED
Acid-Base Excess: 4 mmol/L — ABNORMAL HIGH (ref 0.0–2.0)
Acid-Base Excess: 6 mmol/L — ABNORMAL HIGH (ref 0.0–2.0)
Bicarbonate: 36.7 mmol/L — ABNORMAL HIGH (ref 20.0–28.0)
Bicarbonate: 34.5 mmol/L — ABNORMAL HIGH (ref 20.0–28.0)
Calcium, Ion: 1.27 mmol/L (ref 1.15–1.40)
Calcium, Ion: 1.21 mmol/L (ref 1.15–1.40)
HCT: 44 % (ref 36.0–46.0)
HCT: 40 % (ref 36.0–46.0)
Hemoglobin: 15 g/dL (ref 12.0–15.0)
Hemoglobin: 13.6 g/dL (ref 12.0–15.0)
O2 Saturation: 98 %
O2 Saturation: 96 %
Patient temperature: 98.3
Patient temperature: 98.3
Potassium: 4.8 mmol/L (ref 3.5–5.1)
Potassium: 4.7 mmol/L (ref 3.5–5.1)
Sodium: 142 mmol/L (ref 135–145)
Sodium: 141 mmol/L (ref 135–145)
TCO2: 40 mmol/L — ABNORMAL HIGH (ref 22–32)
TCO2: 36 mmol/L — ABNORMAL HIGH (ref 22–32)
pCO2 arterial: 97.1 mm[Hg] (ref 32–48)
pCO2 arterial: 64.5 mmHg — ABNORMAL HIGH (ref 32–48)
pH, Arterial: 7.184 — CL (ref 7.35–7.45)
pH, Arterial: 7.335 — ABNORMAL LOW (ref 7.35–7.45)
pO2, Arterial: 140 mm[Hg] — ABNORMAL HIGH (ref 83–108)
pO2, Arterial: 90 mmHg (ref 83–108)

## 2023-09-02 LAB — CBC WITH DIFFERENTIAL/PLATELET
Abs Immature Granulocytes: 0.16 10*3/uL — ABNORMAL HIGH (ref 0.00–0.07)
Abs Immature Granulocytes: 0.07 10*3/uL (ref 0.00–0.07)
Basophils Absolute: 0.1 10*3/uL (ref 0.0–0.1)
Basophils Absolute: 0 10*3/uL (ref 0.0–0.1)
Basophils Relative: 1 %
Basophils Relative: 0 %
Eosinophils Absolute: 0 10*3/uL (ref 0.0–0.5)
Eosinophils Absolute: 0 10*3/uL (ref 0.0–0.5)
Eosinophils Relative: 0 %
Eosinophils Relative: 0 %
HCT: 48.3 % — ABNORMAL HIGH (ref 36.0–46.0)
HCT: 43.5 % (ref 36.0–46.0)
Hemoglobin: 14.1 g/dL (ref 12.0–15.0)
Hemoglobin: 13 g/dL (ref 12.0–15.0)
Immature Granulocytes: 2 %
Immature Granulocytes: 1 %
Lymphocytes Relative: 9 %
Lymphocytes Relative: 9 %
Lymphs Abs: 0.8 10*3/uL (ref 0.7–4.0)
Lymphs Abs: 0.6 10*3/uL — ABNORMAL LOW (ref 0.7–4.0)
MCH: 29.3 pg (ref 26.0–34.0)
MCH: 29.3 pg (ref 26.0–34.0)
MCHC: 29.2 g/dL — ABNORMAL LOW (ref 30.0–36.0)
MCHC: 29.9 g/dL — ABNORMAL LOW (ref 30.0–36.0)
MCV: 100.4 fL — ABNORMAL HIGH (ref 80.0–100.0)
MCV: 98.2 fL (ref 80.0–100.0)
Monocytes Absolute: 0.5 10*3/uL (ref 0.1–1.0)
Monocytes Absolute: 0.4 10*3/uL (ref 0.1–1.0)
Monocytes Relative: 6 %
Monocytes Relative: 5 %
Neutro Abs: 6.9 10*3/uL (ref 1.7–7.7)
Neutro Abs: 6 10*3/uL (ref 1.7–7.7)
Neutrophils Relative %: 82 %
Neutrophils Relative %: 85 %
Platelets: 220 10*3/uL (ref 150–400)
Platelets: 199 10*3/uL (ref 150–400)
RBC: 4.81 MIL/uL (ref 3.87–5.11)
RBC: 4.43 MIL/uL (ref 3.87–5.11)
RDW: 14.7 % (ref 11.5–15.5)
RDW: 14.9 % (ref 11.5–15.5)
WBC: 8.4 10*3/uL (ref 4.0–10.5)
WBC: 7.2 10*3/uL (ref 4.0–10.5)
nRBC: 2.4 % — ABNORMAL HIGH (ref 0.0–0.2)
nRBC: 2.5 % — ABNORMAL HIGH (ref 0.0–0.2)

## 2023-09-02 LAB — I-STAT VENOUS BLOOD GAS, ED
Acid-Base Excess: 4 mmol/L — ABNORMAL HIGH (ref 0.0–2.0)
Bicarbonate: 34.1 mmol/L — ABNORMAL HIGH (ref 20.0–28.0)
Calcium, Ion: 1.11 mmol/L — ABNORMAL LOW (ref 1.15–1.40)
HCT: 50 % — ABNORMAL HIGH (ref 36.0–46.0)
Hemoglobin: 17 g/dL — ABNORMAL HIGH (ref 12.0–15.0)
O2 Saturation: 70 %
Potassium: 4.5 mmol/L (ref 3.5–5.1)
Sodium: 142 mmol/L (ref 135–145)
TCO2: 36 mmol/L — ABNORMAL HIGH (ref 22–32)
pCO2, Ven: 72.1 mm[Hg] (ref 44–60)
pH, Ven: 7.282 (ref 7.25–7.43)
pO2, Ven: 43 mm[Hg] (ref 32–45)

## 2023-09-02 LAB — BLOOD GAS, ARTERIAL
Acid-Base Excess: 6.2 mmol/L — ABNORMAL HIGH (ref 0.0–2.0)
Bicarbonate: 39.5 mmol/L — ABNORMAL HIGH (ref 20.0–28.0)
O2 Saturation: 78.9 %
Patient temperature: 37
pCO2 arterial: 116 mm[Hg] (ref 32–48)
pH, Arterial: 7.14 — CL (ref 7.35–7.45)
pO2, Arterial: 57 mm[Hg] — ABNORMAL LOW (ref 83–108)

## 2023-09-02 LAB — RESP PANEL BY RT-PCR (RSV, FLU A&B, COVID)  RVPGX2
Influenza A by PCR: NEGATIVE
Influenza B by PCR: NEGATIVE
Resp Syncytial Virus by PCR: NEGATIVE
SARS Coronavirus 2 by RT PCR: NEGATIVE

## 2023-09-02 LAB — D-DIMER, QUANTITATIVE: D-Dimer, Quant: 0.91 ug{FEU}/mL — ABNORMAL HIGH (ref 0.00–0.50)

## 2023-09-02 LAB — BRAIN NATRIURETIC PEPTIDE: B Natriuretic Peptide: 74.2 pg/mL (ref 0.0–100.0)

## 2023-09-02 LAB — MAGNESIUM: Magnesium: 2.1 mg/dL (ref 1.7–2.4)

## 2023-09-02 LAB — GLUCOSE, CAPILLARY
Glucose-Capillary: 93 mg/dL (ref 70–99)
Glucose-Capillary: 97 mg/dL (ref 70–99)

## 2023-09-02 LAB — PROCALCITONIN: Procalcitonin: <0.1 ng/mL

## 2023-09-02 LAB — URINALYSIS, ROUTINE W REFLEX MICROSCOPIC
Bacteria, UA: NONE SEEN
Bilirubin Urine: NEGATIVE
Glucose, UA: >=500 mg/dL — AB
Hgb urine dipstick: NEGATIVE
Ketones, ur: NEGATIVE mg/dL
Leukocytes,Ua: NEGATIVE
Nitrite: NEGATIVE
Protein, ur: NEGATIVE mg/dL
Specific Gravity, Urine: 1.031 — ABNORMAL HIGH (ref 1.005–1.030)
pH: 5 (ref 5.0–8.0)

## 2023-09-02 LAB — TROPONIN I (HIGH SENSITIVITY)
Troponin I (High Sensitivity): 8 ng/L (ref <18)
Troponin I (High Sensitivity): 11 ng/L (ref <18)

## 2023-09-02 LAB — I-STAT CG4 LACTIC ACID, ED
Lactic Acid, Venous: 1.5 mmol/L (ref 0.5–1.9)
Lactic Acid, Venous: 0.6 mmol/L (ref 0.5–1.9)
Lactic Acid, Venous: 0.8 mmol/L (ref 0.5–1.9)

## 2023-09-02 LAB — HIV ANTIBODY (ROUTINE TESTING W REFLEX): HIV Screen 4th Generation wRfx: NONREACTIVE

## 2023-09-02 LAB — APTT: aPTT: 29 s (ref 24–36)

## 2023-09-02 LAB — MRSA NEXT GEN BY PCR, NASAL: MRSA by PCR Next Gen: NOT DETECTED

## 2023-09-02 LAB — CBG MONITORING, ED: Glucose-Capillary: 104 mg/dL — ABNORMAL HIGH (ref 70–99)

## 2023-09-02 LAB — PHOSPHORUS: Phosphorus: 3.9 mg/dL (ref 2.5–4.6)

## 2023-09-02 LAB — HEPARIN LEVEL (UNFRACTIONATED): Heparin Unfractionated: 0.24 [IU]/mL — ABNORMAL LOW (ref 0.30–0.70)

## 2023-09-02 MED ORDER — CEFTRIAXONE SODIUM 2 G IJ SOLR
2.0000 g | INTRAMUSCULAR | Status: AC
  Administered 2023-09-02 – 2023-09-06 (×5): 2 g via INTRAVENOUS
  Filled 2023-09-02 (×5): qty 20

## 2023-09-02 MED ORDER — DOCUSATE SODIUM 50 MG/5ML PO LIQD
100.0000 mg | Freq: Two times a day (BID) | ORAL | Status: DC
  Filled 2023-09-02: qty 10

## 2023-09-02 MED ORDER — SODIUM CHLORIDE 0.9 % IV SOLN
500.0000 mg | INTRAVENOUS | Status: AC
  Administered 2023-09-02 – 2023-09-04 (×3): 500 mg via INTRAVENOUS
  Filled 2023-09-02 (×3): qty 5

## 2023-09-02 MED ORDER — FENTANYL BOLUS VIA INFUSION
50.0000 ug | INTRAVENOUS | Status: DC | PRN
  Administered 2023-09-02: 100 ug via INTRAVENOUS

## 2023-09-02 MED ORDER — IOHEXOL 350 MG/ML SOLN
90.0000 mL | Freq: Once | INTRAVENOUS | Status: AC | PRN
  Administered 2023-09-02: 90 mL via INTRAVENOUS

## 2023-09-02 MED ORDER — FAMOTIDINE 20 MG PO TABS
20.0000 mg | ORAL_TABLET | Freq: Two times a day (BID) | ORAL | Status: DC
  Filled 2023-09-02: qty 1

## 2023-09-02 MED ORDER — FUROSEMIDE 10 MG/ML IJ SOLN
40.0000 mg | Freq: Two times a day (BID) | INTRAMUSCULAR | Status: AC
  Administered 2023-09-02 – 2023-09-03 (×2): 40 mg via INTRAVENOUS
  Filled 2023-09-02 (×2): qty 4

## 2023-09-02 MED ORDER — CHLORHEXIDINE GLUCONATE CLOTH 2 % EX PADS
6.0000 | MEDICATED_PAD | Freq: Every day | CUTANEOUS | Status: DC
  Administered 2023-09-02 – 2023-09-11 (×10): 6 via TOPICAL

## 2023-09-02 MED ORDER — FENTANYL CITRATE PF 50 MCG/ML IJ SOSY
50.0000 ug | PREFILLED_SYRINGE | Freq: Once | INTRAMUSCULAR | Status: AC
  Administered 2023-09-02: 50 ug via INTRAVENOUS
  Filled 2023-09-02: qty 1

## 2023-09-02 MED ORDER — FENTANYL 2500MCG IN NS 250ML (10MCG/ML) PREMIX INFUSION
50.0000 ug/h | INTRAVENOUS | Status: DC
  Administered 2023-09-02: 50 ug/h via INTRAVENOUS
  Filled 2023-09-02: qty 250

## 2023-09-02 MED ORDER — DOCUSATE SODIUM 100 MG PO CAPS: 100.0000 mg | ORAL_CAPSULE | Freq: Two times a day (BID) | ORAL | Status: DC | PRN

## 2023-09-02 MED ORDER — POLYETHYLENE GLYCOL 3350 17 G PO PACK: 17.0000 g | PACK | Freq: Every day | ORAL | Status: DC | PRN

## 2023-09-02 MED ORDER — POLYETHYLENE GLYCOL 3350 17 G PO PACK: 17.0000 g | PACK | Freq: Every day | ORAL | Status: DC

## 2023-09-02 MED ORDER — PANTOPRAZOLE SODIUM 40 MG IV SOLR
40.0000 mg | Freq: Every day | INTRAVENOUS | Status: DC
  Administered 2023-09-02: 40 mg via INTRAVENOUS
  Filled 2023-09-02 (×2): qty 10

## 2023-09-02 MED ORDER — ORAL CARE MOUTH RINSE
15.0000 mL | OROMUCOSAL | Status: DC
  Administered 2023-09-02 – 2023-09-03 (×10): 15 mL via OROMUCOSAL

## 2023-09-02 MED ORDER — IPRATROPIUM-ALBUTEROL 0.5-2.5 (3) MG/3ML IN SOLN
3.0000 mL | Freq: Once | RESPIRATORY_TRACT | Status: AC
  Administered 2023-09-02: 3 mL via RESPIRATORY_TRACT
  Filled 2023-09-02: qty 3

## 2023-09-02 MED ORDER — SODIUM CHLORIDE 0.9 % IV SOLN: INTRAVENOUS | Status: AC | PRN

## 2023-09-02 MED ORDER — ORAL CARE MOUTH RINSE: 15.0000 mL | OROMUCOSAL | Status: DC | PRN

## 2023-09-02 MED ORDER — METHYLPREDNISOLONE SODIUM SUCC 40 MG IJ SOLR
40.0000 mg | Freq: Every day | INTRAMUSCULAR | Status: DC
  Administered 2023-09-02 – 2023-09-04 (×3): 40 mg via INTRAVENOUS
  Filled 2023-09-02 (×3): qty 1

## 2023-09-02 MED ORDER — HEPARIN (PORCINE) 25000 UT/250ML-% IV SOLN
1900.0000 [IU]/h | INTRAVENOUS | Status: DC
  Administered 2023-09-02: 1700 [IU]/h via INTRAVENOUS
  Administered 2023-09-03: 1950 [IU]/h via INTRAVENOUS
  Filled 2023-09-02 (×3): qty 250

## 2023-09-02 NOTE — H&P (Signed)
NAMEAislin Brewer, MRN:  [id number], DOB:  22-Dec-1965, LOS: 0 ADMISSION DATE:  09/02/2023, CONSULTATION DATE:  09/02/23 REFERRING MD:  MD Rhae Hammock  CHIEF COMPLAINT:  SOB  History of Present Illness:  Patient is a 58yr old female with significant hx of OSA Hyperventilation Syndrome, COPD, CHF, DVT, PE on eliquis, Sleep Apnea, s/p tracheostomy (6 cuffless trach). Last Trache follow up at [facility] was 04/21/22 where she was not requiring nocturnal ventilation and her activity tolerance had improved.  Patient presented today by EMS with significant shortness of breath, which began last night with the inability to fall asleep. Upon arrival to ED, patient was noted to be cyanotic on finger tips and hypoxic. Patient was placed on ventilator due to worsening respiratory failure with hypoxia and hypercarbia. PCCM was consulted for admit and further medical management.   Pertinent  Medical History   Past Medical History:  Diagnosis Date   Chronic diastolic CHF (congestive heart failure) (HCC) 01/05/2022   DVT (deep vein thrombosis) in pregnancy    RLE DVT 02/2016   Dyspnea    Essential hypertension 08/25/2017   Hyperlipidemia 11/21/2021   Morbid obesity (HCC)    Obesity hypoventilation syndrome (HCC) 05/03/2021   PE (pulmonary embolism) 02/29/2016   Sleep apnea    uses a cpap   Tracheostomy present (HCC) 01/05/2022   Size 6 shiley/cuffless  Last changed: 10/9     Discussion  Stable trach.  She does get a Orlich more short of breath with exertion.  Although I did not observe it on my exam she does endorse she will occasionally have a rush of air after removing PMV following activity.  This could suggest some mild air trapping from the trach.  Otherwise she is doing fantastic with the trach     Plan  Cont routin     Significant Hospital Events: Including procedures, antibiotic start and stop dates in addition to other pertinent events   Admit, s/p trache placed on vent due to Acute Hypoxia and  Hypercarbic Respiratory failure   Interim History / Subjective:  On trache vent Hypercarbic   Objective   Blood pressure (!) 143/86, pulse 91, temperature 98.3 F (36.8 C), temperature source Oral, resp. rate 15, height 5\' 6"  (1.676 m), last menstrual period 11/29/2015, SpO2 100%.    Vent Mode: PRVC FiO2 (%):  [50 %-100 %] 100 % Set Rate:  [20 bmp] 20 bmp Vt Set:  [470 mL] 470 mL PEEP:  [5 cmH20] 5 cmH20 Plateau Pressure:  [36 cmH20] 36 cmH20  No intake or output data in the 24 hours ending 09/02/23 1720 There were no vitals filed for this visit.  Examination: General: acute on chronic middle aged adult female, on trache vent HENT: Normocephalic, trache 6 shiley, PERRLA-pupils pin point  Lungs: coarse crackles, diminished lower bases, on vent, no distress  Cardiovascular: s1,s2, RRR, no JVD, no MRG Abdomen: Morbid obesity Extremities: moves extremities to pain, non pitting edema Neuro: RASS -2 to -3, PERRLA intact-sluggish pin point  GU: Foley   Resolved Hospital Problem list   N/a   Assessment & Plan:  Acute Hypoxic/Hypercapnic Respiratory Failure  Obesity Hyperventilation Syndrome vs CHF exacerbation  Hx of s/p Trache 01/05/22  Patient developed respiratory distress on 2/19 CTA negative on 2/18  ABG at 1723- 7.184, PCO2 97.1, PO2 140, bicarb 26.7  P:  Continue ventilator support and lung protective strategies  Continue LTVV 6-8cc/kg  Wean PEEP and Fio2 requirements to sat goal of >92%  HOB >  30 degrees Plat pressures < 30  Intermittent Chest X-ray and ABGS Obtain and follow cultures-blood and tracheal aspirate VAP and PAD protocols in place-Fentanyl gt placed   Wean sedation as tolerated, SBT and WUA daily  Give lasix 40mg  BID-monitor urine outoput  Continue duonebs prn  Place on ceftriaxone and azithromycin  Obtain lactic, procal, if procal WNL-de-escalate antibiotics  Obtain ABG-check to see if hypercarbia is resolving   CHF Hx Last ECHO 2023 EF 60-65%, no  regional wall abnormalities  P: STAT ECHO  Give lasix 40mg  BID, reassess fluid volume status in AM   DVT/PE on eliquis P: Place on heparin gtt, per pharmacy consult   HLD  P: Resume statin tomorrow  May need enteral access placed, or cortrak if remains on vent    Best Practice (right click and "Reselect all SmartList Selections" daily)   Diet/type: NPO DVT prophylaxis systemic heparin Pressure ulcer(s): N/A and pressure ulcer assessment deferred  GI prophylaxis: PPI Lines: N/A Foley:  N/A Code Status:  full code Last date of multidisciplinary goals of care discussion [husband updated at beside]   Labs   CBC: Recent Labs  Lab 09/02/23 1249 09/02/23 1302  WBC 8.4  --   NEUTROABS 6.9  --   HGB 14.1 17.0*  HCT 48.3* 50.0*  MCV 100.4*  --   PLT 220  --     Basic Metabolic Panel: Recent Labs  Lab 09/02/23 1249 09/02/23 1302  NA 142 142  K 4.7 4.5  CL 101  --   CO2 30  --   GLUCOSE 101*  --   BUN 11  --   CREATININE 1.15*  --   CALCIUM 9.2  --    GFR: CrCl cannot be calculated (Unknown ideal weight.). Recent Labs  Lab 09/02/23 1249 09/02/23 1302 09/02/23 1551  WBC 8.4  --   --   LATICACIDVEN  --  1.5 0.6    Liver Function Tests: Recent Labs  Lab 09/02/23 1249  AST 18  ALT 7  ALKPHOS 60  BILITOT 1.0  PROT 8.2*  ALBUMIN 3.5   No results for input(s): "LIPASE", "AMYLASE" in the last 168 hours. No results for input(s): "AMMONIA" in the last 168 hours.  ABG    Component Value Date/Time   PHART 7.14 (LL) 09/02/2023 1515   PCO2ART 116 (HH) 09/02/2023 1515   PO2ART 57 (L) 09/02/2023 1515   HCO3 39.5 (H) 09/02/2023 1515   TCO2 36 (H) 09/02/2023 1302   ACIDBASEDEF 4.0 (H) 05/01/2021 0158   O2SAT 78.9 09/02/2023 1515     Coagulation Profile: No results for input(s): "INR", "PROTIME" in the last 168 hours.  Cardiac Enzymes: No results for input(s): "CKTOTAL", "CKMB", "CKMBINDEX", "TROPONINI" in the last 168 hours.  HbA1C: Hemoglobin A1C   Date/Time Value Ref Range Status  06/10/2016 04:41 PM 5.4  Final    CBG: Recent Labs  Lab 09/02/23 1545  GLUCAP 104*    Review of Systems:   See HPI   Past Medical History:  She,  has a past medical history of Chronic diastolic CHF (congestive heart failure) (HCC) (01/05/2022), DVT (deep vein thrombosis) in pregnancy, Dyspnea, Essential hypertension (08/25/2017), Hyperlipidemia (11/21/2021), Morbid obesity (HCC), Obesity hypoventilation syndrome (HCC) (05/03/2021), PE (pulmonary embolism) (02/29/2016), Sleep apnea, and Tracheostomy present (HCC) (01/05/2022).   Surgical History:   Past Surgical History:  Procedure Laterality Date   CESAREAN SECTION     x 3   OPEN REDUCTION INTERNAL FIXATION (ORIF) DISTAL RADIAL FRACTURE Right 06/20/2019  Procedure: OPEN REDUCTION INTERNAL FIXATION (ORIF) DISTAL RADIAL FRACTURE;  Surgeon: Mack Hook, MD;  Location: [facility] OR;  Service: Orthopedics;  Laterality: Right;  REGIONAL BLOCK TOTAL SURGERY TIME: 90 MINUTES   SYNOVECTOMY Right 11/21/2019   Procedure: RIGHT WRIST LIGAMENT RECONSTRUCTION;  Surgeon: Mack Hook, MD;  Location: [facility] OR;  Service: Orthopedics;  Laterality: Right;  PROCEDURE: RIGHT WRIST LIGAMENT RECONSTRUCTION LENGTH OF SURGERY: 105 MIN     Social History:   reports that she has quit smoking. She has never used smokeless tobacco. She reports that she does not drink alcohol and does not use drugs.   Family History:  Her family history includes Hypertension in her sister.   Allergies No Known Allergies   Home Medications  Prior to Admission medications   Medication Sig Start Date End Date Taking? Authorizing Provider  acetaminophen (TYLENOL) 160 MG/5ML liquid Take 18.8 mLs (600 mg total) by mouth every 4 (four) hours as needed for fever. 02/24/22   Claiborne Rigg, NP  albuterol (VENTOLIN HFA) 108 (90 Base) MCG/ACT inhaler Inhale 1-2 puffs into the lungs every 6 (six) hours as needed for wheezing or shortness of breath.  02/24/22   Claiborne Rigg, NP  amLODipine (NORVASC) 5 MG tablet Take 1 tablet (5 mg total) by mouth daily. 06/17/23   Claiborne Rigg, NP  apixaban (ELIQUIS) 5 MG TABS tablet Take 1 tablet (5 mg total) by mouth 2 (two) times daily. 06/17/23   Claiborne Rigg, NP  atorvastatin (LIPITOR) 20 MG tablet Take 1 tablet (20 mg total) by mouth daily. 06/17/23   Claiborne Rigg, NP  dapagliflozin propanediol (FARXIGA) 10 MG TABS tablet Take 1 tablet (10 mg total) by mouth daily. 06/17/23   Claiborne Rigg, NP  furosemide (LASIX) 20 MG tablet Take 1 tablet (20 mg total) by mouth daily. Take 40 mg or 2 tablets for the next 5 days then resume 20 mg or 1 tablet by mouth daily by mouth. 06/17/23   Claiborne Rigg, NP  Misc. Devices MISC Please supply patient with tracheostomy humidifier  Z93.0 03/25/22   Claiborne Rigg, NP  mupirocin ointment (BACTROBAN) 2 % Apply 1 Application topically 2 (two) times daily. Apply to lower right leg/ankle 06/17/23   Claiborne Rigg, NP  PRESCRIPTION MEDICATION 1 each by Other route at bedtime. CPAP- At bedtime    [provider]  spironolactone (ALDACTONE) 25 MG tablet Take 1 tablet (25 mg total) by mouth daily. 06/17/23   Claiborne Rigg, NP     Critical care time: 45 mins     Christian [facility] [address] [facility] 09/02/2023, 6:27 PM  Please see [email or web address] for pager details.  From 7A-7P if no response, please call [phone]. After hours, please call ELink [phone].

## 2023-09-02 NOTE — ED Notes (Signed)
Pt transported to CT with RN and RT and now returned to room without incident. Pt continues to be somnolent

## 2023-09-02 NOTE — Progress Notes (Signed)
ANTICOAGULATION CONSULT NOTE  Pharmacy Consult for Heparin Indication:  Hx of PE  No Known Allergies  Patient Measurements: Height: 5\' 6"  (167.6 cm) IBW/kg (Calculated) : 59.3 Heparin Dosing Weight: 107.5 kg  Vital Signs: Temp: 98.3 F (36.8 C) (02/19 1353) Temp Source: Oral (02/19 1353) BP: 123/52 (02/19 1900) Pulse Rate: 65 (02/19 1900)  Labs: Recent Labs    09/02/23 1249 09/02/23 1302 09/02/23 1547 09/02/23 1723 09/02/23 1858  HGB 14.1 17.0*  --  15.0 13.6  HCT 48.3* 50.0*  --  44.0 40.0  PLT 220  --   --   --   --   CREATININE 1.15*  --   --   --   --   TROPONINIHS 8  --  11  --   --     CrCl cannot be calculated (Unknown ideal weight.).   Medical History: Past Medical History:  Diagnosis Date   Chronic diastolic CHF (congestive heart failure) (HCC) 01/05/2022   DVT (deep vein thrombosis) in pregnancy    RLE DVT 02/2016   Dyspnea    Essential hypertension 08/25/2017   Hyperlipidemia 11/21/2021   Morbid obesity (HCC)    Obesity hypoventilation syndrome (HCC) 05/03/2021   PE (pulmonary embolism) 02/29/2016   Sleep apnea    uses a cpap   Tracheostomy present (HCC) 01/05/2022   Size 6 shiley/cuffless  Last changed: 10/9     Discussion  Stable trach.  She does get a [place] more short of breath with exertion.  Although I did not observe it on my exam she does endorse she will occasionally have a rush of air after removing PMV following activity.  This could suggest some mild air trapping from the trach.  Otherwise she is doing fantastic with the trach     Plan  Cont routin    Medications:  (Not in a hospital admission)  Scheduled:   docusate  100 mg Per Tube BID   famotidine  20 mg Per Tube BID   furosemide  40 mg Intravenous BID   methylPREDNISolone (SOLU-MEDROL) injection  40 mg Intravenous Daily   pantoprazole (PROTONIX) IV  40 mg Intravenous QHS   polyethylene glycol  17 g Per Tube Daily   Infusions:   azithromycin 500 mg (09/02/23 1935)    cefTRIAXone (ROCEPHIN)  IV 2 g (09/02/23 1925)   fentaNYL infusion INTRAVENOUS Stopped (09/02/23 1929)   PRN: fentaNYL  Assessment: 58 yof with a history of OSA Hyperventilation Syndrome, COPD, CHF, DVT, PE on eliquis, Sleep Apnea, s/p tracheostomy . Patient is presenting with SOB. Heparin per pharmacy consult placed for  hx of PE .  Patient is on apixaban prior to arrival. Last dose uncertain but per son was eitehr 2/19am or 2/18pm. Will require aPTT monitoring due to likely falsely high anti-Xa level secondary to DOAC use.  Hgb 17; plt 220  Goal of Therapy:  Heparin level 0.3-0.7 units/ml aPTT 66-102 seconds Monitor platelets by anticoagulation protocol: Yes   Plan:  No initial heparin bolus Start heparin infusion at 1700 units/hr at 10pm Check aPTT & anti-Xa level at 0500 and daily while on heparin Continue to monitor via aPTT until levels are correlated Continue to monitor H&H and platelets  Delmar Landau, PharmD, BCPS 09/02/2023 7:45 PM ED Clinical Pharmacist -  [phone]

## 2023-09-02 NOTE — ED Notes (Signed)
Pt appears somnolence than early. RN notifed EDP  ABG ordered and RT at the bedside

## 2023-09-02 NOTE — Progress Notes (Signed)
Pt transported from ED to 45M on 100% O2 w/vent settings as per flowsheet w/out incidence. Pt tol well.

## 2023-09-02 NOTE — ED Notes (Signed)
Pt- c-collar adjusted

## 2023-09-02 NOTE — ED Provider Notes (Signed)
I was asked to follow-up on the patient CT angiogram prior to admission.  The patient is presenting today with hypoxia with unremarkable workup thus far.  The patient does appear to be hypercarbic on most recent blood gas.  The patient was placed on the ventilator at the time of signout.  Physical Exam  BP (!) 154/78   Pulse 97   Temp 98.3 F (36.8 C) (Oral)   Resp 14   Ht 5\' 6"  (1.676 m)   LMP 11/29/2015   SpO2 100%   BMI 66.01 kg/m   Physical Exam Gen: modbidly obese, will arouse to verbal stimuli Resp: diminished bilaterally Procedures  Procedures  ED Course / MDM   Clinical Course as of 09/02/23 1613  Wed Sep 02, 2023  1243 DG Chest Portable 1 View [SA]  1530 COVID RSV influenza all negative.  Chest x-ray shows some pulmonary edema but is not overtly fluid overloaded.  D-dimer of 0.91.  Will need CTA of the chest to evaluate for PE  Repeat blood gas shows worsening respiratory acidosis.  We will transition her to a cuffed tracheostomy tube and ventilator to improve ventilation.  Jackquline Bosch DO, am transitioning care of this patient to the oncoming provider pending CT of the chest, reevaluation on vent and admission [MP]    Clinical Course User Index [MP] Royanne Foots, DO [SA] Marinus Maw   Medical Decision Making Amount and/or Complexity of Data Reviewed Labs: ordered. Radiology: ordered.  Risk Prescription drug management.   The patient's CT scan was negative.  A call was placed for admission.        Durwin Glaze, MD 09/02/23 1700

## 2023-09-02 NOTE — ED Notes (Signed)
Pt cleaned, changed into gown, purwick placed. Vital signs stable at this time.

## 2023-09-02 NOTE — Progress Notes (Signed)
eLink Physician-Brief Progress Note Patient Name: Angel Brewer DOB: 1965-11-01 MRN: [id number]   Date of Service  09/02/2023  HPI/Events of Note  Patient with a history of chronic hypoxemic / hypercapnic respiratory failure requiring tracheostomy admitted with acute on chronic hypoxemic / hypocapnic respiratory failure. Patient has a history of morbid obesity, OSA and obesity-hypoventilation syndrome. Work up is in progress.  eICU Interventions  New Patient Evaluation.        Migdalia Dk 09/02/2023, 9:31 PM

## 2023-09-02 NOTE — Progress Notes (Addendum)
2 RNs attempted to place NG tube.  Both RN's were unsuccessful.  Will continue to monitor patient.  Elink Doc notified.  Will hold off until tomorrow.

## 2023-09-02 NOTE — Procedures (Signed)
Tracheostomy Change Note  Patient Details:   Name: Angel Brewer DOB: [birth year] MRN: [id number]    Airway Documentation:     Evaluation  O2 sats: stable throughout Complications: No apparent complications Patient did tolerate procedure well. Bilateral Breath Sounds: Diminished, Expiratory wheezes Pt trach changed by RT x2 to #6 shiley cuffed per ED Provider. Color changed noted.    Allen Norris 09/02/2023, 3:49 PM

## 2023-09-02 NOTE — ED Notes (Signed)
RN introduced self to pt and family member at bedside. Pt tachypneic and hypoxic, SPO2 75%, trach collar on 15L. Respiratory at bedside to place cuff trach and transition pt to ventilator support.

## 2023-09-02 NOTE — ED Triage Notes (Signed)
Pt bib ems from home c/o sob. Pt cyanotic in finger tips and lips. Non-rebreather applied O2 94%. Pt was sob last night and not able to sleep. Pt denies pain. Hx PE (Eliquis)   BP 144/72 CBG 131 HR 100 Non-rebreather 98%

## 2023-09-02 NOTE — ED Provider Notes (Signed)
[place] EMERGENCY DEPARTMENT AT [facility] Provider Note   CSN: [id number] Arrival date & time: 09/02/23  1224     History  Chief Complaint  Patient presents with   Shortness of Breath    Angel Brewer is a 58 y.o. female.  With a history of status post tracheostomy, COPD, heart failure, pulmonary embolism on Eliquis who presents to the ED for shortness of breath.  Patient first became acutely short of breath last night at home and was unable to sleep.  Shortness of breath worsened today and EMS was called to her house.  Numerous noted to be cyanotic and hypoxic on room air with improvement after 15 L nonrebreather was applied.  She reports compliance with her Eliquis.  No productive coughing fevers or chills.  Also denies chest pain nausea vomiting and recent illness  HPI     Home Medications Prior to Admission medications   Medication Sig Start Date End Date Taking? Authorizing Provider  acetaminophen (TYLENOL) 160 MG/5ML liquid Take 18.8 mLs (600 mg total) by mouth every 4 (four) hours as needed for fever. 02/24/22   Claiborne Rigg, NP  albuterol (VENTOLIN HFA) 108 (90 Base) MCG/ACT inhaler Inhale 1-2 puffs into the lungs every 6 (six) hours as needed for wheezing or shortness of breath. 02/24/22   Claiborne Rigg, NP  amLODipine (NORVASC) 5 MG tablet Take 1 tablet (5 mg total) by mouth daily. 06/17/23   Claiborne Rigg, NP  apixaban (ELIQUIS) 5 MG TABS tablet Take 1 tablet (5 mg total) by mouth 2 (two) times daily. 06/17/23   Claiborne Rigg, NP  atorvastatin (LIPITOR) 20 MG tablet Take 1 tablet (20 mg total) by mouth daily. 06/17/23   Claiborne Rigg, NP  dapagliflozin propanediol (FARXIGA) 10 MG TABS tablet Take 1 tablet (10 mg total) by mouth daily. 06/17/23   Claiborne Rigg, NP  furosemide (LASIX) 20 MG tablet Take 1 tablet (20 mg total) by mouth daily. Take 40 mg or 2 tablets for the next 5 days then resume 20 mg or 1 tablet by mouth daily by mouth. 06/17/23    Claiborne Rigg, NP  Misc. Devices MISC Please supply patient with tracheostomy humidifier  Z93.0 03/25/22   Claiborne Rigg, NP  mupirocin ointment (BACTROBAN) 2 % Apply 1 Application topically 2 (two) times daily. Apply to lower right leg/ankle 06/17/23   Claiborne Rigg, NP  PRESCRIPTION MEDICATION 1 each by Other route at bedtime. CPAP- At bedtime    [provider]  spironolactone (ALDACTONE) 25 MG tablet Take 1 tablet (25 mg total) by mouth daily. 06/17/23   Claiborne Rigg, NP      Allergies    Patient has no known allergies.    Review of Systems   Review of Systems  Physical Exam Updated Vital Signs BP (!) 154/78   Pulse 97   Temp 98.3 F (36.8 C) (Oral)   Resp 14   Ht 5\' 6"  (1.676 m)   LMP 11/29/2015   SpO2 100%   BMI 66.01 kg/m  Physical Exam Vitals and nursing note reviewed.  Constitutional:      Appearance: She is obese.  HENT:     Head: Normocephalic and atraumatic.  Eyes:     Pupils: Pupils are equal, round, and reactive to light.  Cardiovascular:     Rate and Rhythm: Normal rate and regular rhythm.  Pulmonary:     Effort: Pulmonary effort is normal. Tachypnea present.  Breath sounds: Normal breath sounds. No wheezing, rhonchi or rales.     Comments: Tracheostomy tube in place and capped Abdominal:     Palpations: Abdomen is soft.     Tenderness: There is no abdominal tenderness.  Skin:    General: Skin is warm and dry.  Neurological:     Mental Status: She is alert.  Psychiatric:        Mood and Affect: Mood normal.     ED Results / Procedures / Treatments   Labs (all labs ordered are listed, but only abnormal results are displayed) Labs Reviewed  COMPREHENSIVE METABOLIC PANEL - Abnormal; Notable for the following components:      Result Value   Glucose, Bld 101 (*)    Creatinine, Ser 1.15 (*)    Total Protein 8.2 (*)    GFR, Estimated 55 (*)    All other components within normal limits  CBC WITH DIFFERENTIAL/PLATELET -  Abnormal; Notable for the following components:   HCT 48.3 (*)    MCV 100.4 (*)    MCHC 29.2 (*)    nRBC 2.4 (*)    Abs Immature Granulocytes 0.16 (*)    All other components within normal limits  D-DIMER, QUANTITATIVE - Abnormal; Notable for the following components:   D-Dimer, Quant 0.91 (*)    All other components within normal limits  BLOOD GAS, ARTERIAL - Abnormal; Notable for the following components:   pH, Arterial 7.14 (*)    pCO2 arterial 116 (*)    pO2, Arterial 57 (*)    Bicarbonate 39.5 (*)    Acid-Base Excess 6.2 (*)    All other components within normal limits  I-STAT VENOUS BLOOD GAS, ED - Abnormal; Notable for the following components:   pCO2, Ven 72.1 (*)    Bicarbonate 34.1 (*)    TCO2 36 (*)    Acid-Base Excess 4.0 (*)    Calcium, Ion 1.11 (*)    HCT 50.0 (*)    Hemoglobin 17.0 (*)    All other components within normal limits  CBG MONITORING, ED - Abnormal; Notable for the following components:   Glucose-Capillary 104 (*)    All other components within normal limits  RESP PANEL BY RT-PCR (RSV, FLU A&B, COVID)  RVPGX2  CULTURE, BLOOD (ROUTINE X 2)  CULTURE, BLOOD (ROUTINE X 2)  BRAIN NATRIURETIC PEPTIDE  I-STAT CG4 LACTIC ACID, ED  I-STAT CG4 LACTIC ACID, ED  TROPONIN I (HIGH SENSITIVITY)  TROPONIN I (HIGH SENSITIVITY)    EKG EKG Interpretation Date/Time:  Wednesday September 02 2023 12:30:45 EST Ventricular Rate:  96 PR Interval:  180 QRS Duration:  92 QT Interval:  344 QTC Calculation: 435 R Axis:   88  Text Interpretation: Sinus rhythm Right atrial enlargement Baseline wander in lead(s) V1 Confirmed by Estelle June [phone]) on 09/02/2023 4:03:42 PM  Radiology DG Chest Portable 1 View Result Date: 09/02/2023 CLINICAL DATA:  Shortness of breath, cyanotic fingertips and lips. EXAM: PORTABLE CHEST 1 VIEW COMPARISON:  Radiograph 06/16/2022 and CT chest 06/20/2022 FINDINGS: Tracheostomy is unchanged. Cardiomegaly. Bilateral diffuse hazy airspace and  interstitial opacities with a lower lung and perihilar predominance. Question layering bilateral pleural effusions. No pneumothorax. IMPRESSION: Cardiomegaly with pulmonary edema and possible layering bilateral pleural effusions. Electronically Signed   By: Minerva Fester M.D.   On: 09/02/2023 13:05    Procedures .Critical Care  Performed by: Royanne Foots, DO Authorized by: Royanne Foots, DO   Critical care provider statement:    Critical  care time (minutes):  40   Critical care was necessary to treat or prevent imminent or life-threatening deterioration of the following conditions:  Respiratory failure   Critical care was time spent personally by me on the following activities:  Development of treatment plan with patient or surrogate, discussions with consultants, evaluation of patient's response to treatment, examination of patient, ordering and review of laboratory studies, ordering and review of radiographic studies, ordering and performing treatments and interventions, pulse oximetry, re-evaluation of patient's condition and review of old charts   I assumed direction of critical care for this patient from another provider in my specialty: no       Medications Ordered in ED Medications  ipratropium-albuterol (DUONEB) 0.5-2.5 (3) MG/3ML nebulizer solution 3 mL (3 mLs Nebulization Given by Other 09/02/23 1255)    ED Course/ Medical Decision Making/ A&P Clinical Course as of 09/02/23 1605  Wed Sep 02, 2023  1243 DG Chest Portable 1 View [SA]  1530 COVID RSV influenza all negative.  Chest x-ray shows some pulmonary edema but is not overtly fluid overloaded.  D-dimer of 0.91.  Will need CTA of the chest to evaluate for PE  Repeat blood gas shows worsening respiratory acidosis.  We will transition her to a cuffed tracheostomy tube and ventilator to improve ventilation.  Jackquline Bosch DO, am transitioning care of this patient to the oncoming provider pending CT of the chest,  reevaluation on vent and admission [MP]    Clinical Course User Index [MP] Royanne Foots, DO [SA] Marinus Maw                                 Medical Decision Making 59 year old female with extensive pulmonary history as above presenting for acute shortness of breath hypoxemia and cyanosis.  Improvement on nonrebreather.  Afebrile slightly hypertensive.  Differential diagnosis includes COPD exacerbation, heart failure exacerbation, pulmonary embolism, pneumonia viral respiratory illness.  We have transitioned her to trach mask for supplemental oxygenation.  Will obtain laboratory workup including D-dimer.  If D-dimer is elevated we will need to obtain CTA of the chest to evaluate for PE.  This is less likely considering that she is compliant with her Eliquis.  Plan for admission  Amount and/or Complexity of Data Reviewed Labs: ordered. Radiology: ordered.  Risk Prescription drug management.           Final Clinical Impression(s) / ED Diagnoses Final diagnoses:  Acute hypoxemic respiratory failure [facility])    Rx / DC Orders ED Discharge Orders     None         Royanne Foots, DO 09/02/23 1605

## 2023-09-03 ENCOUNTER — Inpatient Hospital Stay (HOSPITAL_COMMUNITY): Payer: Medicaid Other

## 2023-09-03 DIAGNOSIS — J9622 Acute and chronic respiratory failure with hypercapnia: Secondary | ICD-10-CM

## 2023-09-03 DIAGNOSIS — J9621 Acute and chronic respiratory failure with hypoxia: Secondary | ICD-10-CM

## 2023-09-03 DIAGNOSIS — J441 Chronic obstructive pulmonary disease with (acute) exacerbation: Secondary | ICD-10-CM

## 2023-09-03 DIAGNOSIS — J984 Other disorders of lung: Secondary | ICD-10-CM | POA: Diagnosis not present

## 2023-09-03 DIAGNOSIS — J9601 Acute respiratory failure with hypoxia: Secondary | ICD-10-CM

## 2023-09-03 DIAGNOSIS — J90 Pleural effusion, not elsewhere classified: Secondary | ICD-10-CM | POA: Diagnosis not present

## 2023-09-03 DIAGNOSIS — J449 Chronic obstructive pulmonary disease, unspecified: Secondary | ICD-10-CM | POA: Diagnosis not present

## 2023-09-03 DIAGNOSIS — J9690 Respiratory failure, unspecified, unspecified whether with hypoxia or hypercapnia: Secondary | ICD-10-CM | POA: Diagnosis not present

## 2023-09-03 LAB — RESPIRATORY PANEL BY PCR

## 2023-09-03 LAB — PHOSPHORUS: Phosphorus: 5 mg/dL — ABNORMAL HIGH (ref 2.5–4.6)

## 2023-09-03 LAB — CBC
HCT: 42.6 % (ref 36.0–46.0)
Hemoglobin: 12.4 g/dL (ref 12.0–15.0)
MCH: 28.8 pg (ref 26.0–34.0)
MCHC: 29.1 g/dL — ABNORMAL LOW (ref 30.0–36.0)
MCV: 99.1 fL (ref 80.0–100.0)
Platelets: 194 10*3/uL (ref 150–400)
RBC: 4.3 MIL/uL (ref 3.87–5.11)
RDW: 14.9 % (ref 11.5–15.5)
WBC: 6.5 10*3/uL (ref 4.0–10.5)
nRBC: 0.9 % — ABNORMAL HIGH (ref 0.0–0.2)

## 2023-09-03 LAB — BASIC METABOLIC PANEL
Anion gap: 9 (ref 5–15)
BUN: 15 mg/dL (ref 6–20)
CO2: 31 mmol/L (ref 22–32)
Calcium: 8.8 mg/dL — ABNORMAL LOW (ref 8.9–10.3)
Chloride: 101 mmol/L (ref 98–111)
Creatinine, Ser: 1.07 mg/dL — ABNORMAL HIGH (ref 0.44–1.00)
GFR, Estimated: >60 mL/min (ref >60)
Glucose, Bld: 119 mg/dL — ABNORMAL HIGH (ref 70–99)
Potassium: 4.9 mmol/L (ref 3.5–5.1)
Sodium: 141 mmol/L (ref 135–145)

## 2023-09-03 LAB — ECHOCARDIOGRAM COMPLETE
AR max vel: 2.9 cm2
AV Area VTI: 2.75 cm2
AV Area mean vel: 2.68 cm2
AV Mean grad: 6 mm[Hg]
AV Peak grad: 10.4 mm[Hg]
Ao pk vel: 1.61 m/s
Area-P 1/2: 2.86 cm2
Height: 66 in
MV VTI: 2.42 cm2
S' Lateral: 3.3 cm
Single Plane A4C EF: 77.9 %
Weight: 6251.2 [oz_av]

## 2023-09-03 LAB — GLUCOSE, CAPILLARY
Glucose-Capillary: 109 mg/dL — ABNORMAL HIGH (ref 70–99)
Glucose-Capillary: 119 mg/dL — ABNORMAL HIGH (ref 70–99)
Glucose-Capillary: 121 mg/dL — ABNORMAL HIGH (ref 70–99)
Glucose-Capillary: 98 mg/dL (ref 70–99)

## 2023-09-03 LAB — MAGNESIUM: Magnesium: 2.3 mg/dL (ref 1.7–2.4)

## 2023-09-03 LAB — APTT
aPTT: 59 s — ABNORMAL HIGH (ref 24–36)
aPTT: 94 s — ABNORMAL HIGH (ref 24–36)

## 2023-09-03 LAB — HEPARIN LEVEL (UNFRACTIONATED): Heparin Unfractionated: 0.49 [IU]/mL (ref 0.30–0.70)

## 2023-09-03 MED ORDER — REVEFENACIN 175 MCG/3ML IN SOLN
175.0000 ug | Freq: Every day | RESPIRATORY_TRACT | Status: DC
  Administered 2023-09-03 – 2023-09-11 (×9): 175 ug via RESPIRATORY_TRACT
  Filled 2023-09-03 (×9): qty 3

## 2023-09-03 MED ORDER — ORAL CARE MOUTH RINSE: 15.0000 mL | OROMUCOSAL | Status: DC | PRN

## 2023-09-03 MED ORDER — ARFORMOTEROL TARTRATE 15 MCG/2ML IN NEBU
15.0000 ug | INHALATION_SOLUTION | Freq: Two times a day (BID) | RESPIRATORY_TRACT | Status: DC
  Administered 2023-09-03 – 2023-09-11 (×15): 15 ug via RESPIRATORY_TRACT
  Filled 2023-09-03 (×17): qty 2

## 2023-09-03 MED ORDER — APIXABAN 5 MG PO TABS
5.0000 mg | ORAL_TABLET | Freq: Two times a day (BID) | ORAL | Status: DC
  Administered 2023-09-03 – 2023-09-11 (×16): 5 mg via ORAL
  Filled 2023-09-03 (×16): qty 1

## 2023-09-03 MED ORDER — FUROSEMIDE 10 MG/ML IJ SOLN
40.0000 mg | Freq: Two times a day (BID) | INTRAMUSCULAR | Status: DC
Start: 2023-09-03 — End: 2023-09-03

## 2023-09-03 MED ORDER — BISACODYL 10 MG RE SUPP: 10.0000 mg | Freq: Every day | RECTAL | Status: DC | PRN

## 2023-09-03 MED ORDER — BUDESONIDE 0.5 MG/2ML IN SUSP
0.5000 mg | Freq: Two times a day (BID) | RESPIRATORY_TRACT | Status: DC
  Administered 2023-09-03 – 2023-09-11 (×17): 0.5 mg via RESPIRATORY_TRACT
  Filled 2023-09-03 (×17): qty 2

## 2023-09-03 MED ORDER — ATORVASTATIN CALCIUM 10 MG PO TABS
20.0000 mg | ORAL_TABLET | Freq: Every day | ORAL | Status: DC
  Administered 2023-09-04 – 2023-09-11 (×8): 20 mg via ORAL
  Filled 2023-09-03 (×8): qty 2

## 2023-09-03 MED ORDER — ORAL CARE MOUTH RINSE
15.0000 mL | OROMUCOSAL | Status: DC
  Administered 2023-09-03 – 2023-09-11 (×15): 15 mL via OROMUCOSAL

## 2023-09-03 MED ORDER — IPRATROPIUM-ALBUTEROL 0.5-2.5 (3) MG/3ML IN SOLN
3.0000 mL | Freq: Four times a day (QID) | RESPIRATORY_TRACT | Status: DC
  Administered 2023-09-03: 3 mL via RESPIRATORY_TRACT
  Filled 2023-09-03: qty 3

## 2023-09-03 MED ORDER — SPIRONOLACTONE 25 MG PO TABS
25.0000 mg | ORAL_TABLET | Freq: Every day | ORAL | Status: DC
  Administered 2023-09-04 – 2023-09-11 (×8): 25 mg via ORAL
  Filled 2023-09-03 (×8): qty 1

## 2023-09-03 MED ORDER — HEPARIN (PORCINE) 25000 UT/250ML-% IV SOLN: 1900.0000 [IU]/h | INTRAVENOUS | Status: AC

## 2023-09-03 MED ORDER — FUROSEMIDE 10 MG/ML IJ SOLN
40.0000 mg | Freq: Two times a day (BID) | INTRAMUSCULAR | Status: DC
  Administered 2023-09-03 – 2023-09-04 (×2): 40 mg via INTRAVENOUS
  Filled 2023-09-03 (×2): qty 4

## 2023-09-03 MED ORDER — APIXABAN 5 MG PO TABS
5.0000 mg | ORAL_TABLET | Freq: Two times a day (BID) | ORAL | Status: DC
Start: 2023-09-03 — End: 2023-09-03

## 2023-09-03 MED ORDER — DOCUSATE SODIUM 100 MG PO CAPS
100.0000 mg | ORAL_CAPSULE | Freq: Two times a day (BID) | ORAL | Status: DC
  Administered 2023-09-08: 100 mg via ORAL
  Filled 2023-09-03 (×9): qty 1

## 2023-09-03 MED ORDER — POLYETHYLENE GLYCOL 3350 17 G PO PACK
17.0000 g | PACK | Freq: Every day | ORAL | Status: DC
  Administered 2023-09-08: 17 g via ORAL
  Filled 2023-09-03 (×4): qty 1

## 2023-09-03 MED ORDER — PERFLUTREN LIPID MICROSPHERE
1.0000 mL | INTRAVENOUS | Status: AC | PRN
  Administered 2023-09-03: 2 mL via INTRAVENOUS

## 2023-09-03 NOTE — Progress Notes (Signed)
Vent Mode: PRVC FiO2 (%):  [50 %-100 %] 65 % Set Rate:  [18 bmp-20 bmp] 18 bmp Vt Set:  [470 mL] 470 mL PEEP:  [5 cmH20-8 cmH20] 8 cmH20 Plateau Pressure:  [28 cmH20-36 cmH20] 29 cmH20    Patient remains on the following vent settings. Trach secure with positional cuff leak, spare trach at bedside. B/s equal with bilateral chest rise. No respiratory distress noted at this time.

## 2023-09-03 NOTE — TOC CM/SW Note (Signed)
Transition of Care [facility]) - Inpatient Brief Assessment   Patient Details  Name: Jacy Brocker MRN: [id number] Date of Birth: [birth year]  Transition of Care [facility]) CM/SW Contact:    Tom-Johnson, Hershal Coria, RN Phone Number: 09/03/2023, 1:20 PM   Clinical Narrative:  Patient presented to the ED with Shortness Of Breath, Cyanosis to Finger tips and Lips.   Patient is on Chronic Cuffless Trach at home. Currently on Trach/Vent. On Heparin gtt, IV abx, IV Solumedrol and Neb tx.   Patient not Medically ready for discharge.  CM will continue to follow as patient progresses with care towards discharge.              Transition of Care Asessment: Insurance and Status: Insurance coverage has been reviewed Patient has primary care physician: Yes Home environment has been reviewed: Yes Prior level of function:: Modified Independent   Social Drivers of Health Review: SDOH reviewed no interventions necessary Readmission risk has been reviewed: Yes Transition of care needs: transition of care needs identified, TOC will continue to follow

## 2023-09-03 NOTE — Progress Notes (Signed)
L radial ABG attempted by 3rd RT w/out success.

## 2023-09-03 NOTE — Progress Notes (Signed)
ANTICOAGULATION CONSULT NOTE  Pharmacy Consult for Heparin Indication:  History of VTE  No Known Allergies  Patient Measurements: Height: 5\' 6"  (167.6 cm) Weight: (!) 177.2 kg (390 lb 11.2 oz) IBW/kg (Calculated) : 59.3 Heparin Dosing Weight: 107.5 kg  Vital Signs: Temp: 98.8 F (37.1 C) (02/20 1200) Temp Source: Oral (02/20 1200) BP: 118/84 (02/20 1100) Pulse Rate: 58 (02/20 1300)  Labs: Recent Labs    09/02/23 1249 09/02/23 1302 09/02/23 1547 09/02/23 1723 09/02/23 1858 09/02/23 2111 09/03/23 0317 09/03/23 1202  HGB 14.1   < >  --    < > 13.6 13.0 12.4  --   HCT 48.3*   < >  --    < > 40.0 43.5 42.6  --   PLT 220  --   --   --   --  199 194  --   APTT  --   --   --   --   --  29 59* 94*  HEPARINUNFRC  --   --   --   --   --  0.24* 0.49  --   CREATININE 1.15*  --   --   --   --   --  1.07*  --   TROPONINIHS 8  --  11  --   --   --   --   --    < > = values in this interval not displayed.    Estimated Creatinine Clearance: 96.4 mL/min (A) (by C-G formula based on SCr of 1.07 mg/dL (H)).   Assessment: 58 presented with SOB. Pharmacy consulted to dose IV heparin for history of VTE while Eliquis is on hold.  Last dose of Eliquis is uncertain but per son was eitehr 2/19 am or 2/18 pm. Will require aPTT monitoring due to likely falsely high anti-Xa level secondary to DOAC use.  APTT therapeutic at 94 sec; CBC stable; no bleeding reported.  Goal of Therapy:  Heparin level 0.3-0.7 units/ml aPTT 66-102 seconds Monitor platelets by anticoagulation protocol: Yes   Plan:  Reduce heparin gtt slightly to 1900 units/hr Daily heparin level, aPTT and CBC F/u with resuming Eliquis  Shuayb Schepers D. Laney Potash, PharmD, BCPS, BCCCP 09/03/2023, 1:57 PM

## 2023-09-03 NOTE — Progress Notes (Signed)
ABG attempted by 2 RT's. Unable to obtain sample.

## 2023-09-03 NOTE — Progress Notes (Signed)
Pt was transitioned from ventilator to ATC 12L 50%. Pt is tolerating well at this time.

## 2023-09-03 NOTE — Progress Notes (Signed)
ANTICOAGULATION CONSULT NOTE Pharmacy Consult for Heparin Indication:  Hx of PE Brief A/P: aPTT subtherapeutic Increase Heparin rate  No Known Allergies  Patient Measurements: Height: 5\' 6"  (167.6 cm) Weight: (!) 177.2 kg (390 lb 11.2 oz) IBW/kg (Calculated) : 59.3 Heparin Dosing Weight: 107.5 kg  Vital Signs: Temp: 99.9 F (37.7 C) (02/19 2342) Temp Source: Axillary (02/19 2342) BP: 117/68 (02/20 0300) Pulse Rate: 63 (02/20 0313)  Labs: Recent Labs    09/02/23 1249 09/02/23 1302 09/02/23 1547 09/02/23 1723 09/02/23 1858 09/02/23 2111 09/03/23 0317  HGB 14.1   < >  --    < > 13.6 13.0 12.4  HCT 48.3*   < >  --    < > 40.0 43.5 42.6  PLT 220  --   --   --   --  199 194  APTT  --   --   --   --   --  29 59*  HEPARINUNFRC  --   --   --   --   --  0.24* 0.49  CREATININE 1.15*  --   --   --   --   --  1.07*  TROPONINIHS 8  --  11  --   --   --   --    < > = values in this interval not displayed.    Estimated Creatinine Clearance: 96.4 mL/min (A) (by C-G formula based on SCr of 1.07 mg/dL (H)).  Assessment: 58 y.o. female with h/o PE/DVT, Eliquis on hold, for heparin  Goal of Therapy:  Heparin level 0.3-0.7 units/ml aPTT 66-102 seconds Monitor platelets by anticoagulation protocol: Yes   Plan:  Increase Heparin 1950 units/hr Check aPTT in 8 hours   Geannie Risen, PharmD, BCPS

## 2023-09-03 NOTE — Progress Notes (Signed)
NAMEPearl Brewer, MRN:  [id number], DOB:  01/03/1966, LOS: 1 ADMISSION DATE:  09/02/2023, CONSULTATION DATE:  09/02/23 REFERRING MD:  MD Rhae Hammock  CHIEF COMPLAINT:  SOB  History of Present Illness:  Patient is a 57yr old female with significant hx of OSA Hyperventilation Syndrome, COPD, CHF, DVT, PE on eliquis, Sleep Apnea, s/p tracheostomy (6 cuffless trach). Last Trache follow up at [facility] was 04/21/22 where she was not requiring nocturnal ventilation and her activity tolerance had improved.  Patient presented today by EMS with significant shortness of breath, which began last night with the inability to fall asleep. Upon arrival to ED, patient was noted to be cyanotic on finger tips and hypoxic. Patient was placed on ventilator due to worsening respiratory failure with hypoxia and hypercarbia. PCCM was consulted for admit and further medical management.   Pertinent  Medical History   Past Medical History:  Diagnosis Date   Chronic diastolic CHF (congestive heart failure) (HCC) 01/05/2022   DVT (deep vein thrombosis) in pregnancy    RLE DVT 02/2016   Dyspnea    Essential hypertension 08/25/2017   Hyperlipidemia 11/21/2021   Morbid obesity (HCC)    Obesity hypoventilation syndrome (HCC) 05/03/2021   PE (pulmonary embolism) 02/29/2016   Sleep apnea    uses a cpap   Tracheostomy present (HCC) 01/05/2022   Size 6 shiley/cuffless  Last changed: 10/9     Discussion  Stable trach.  She does get a Harshman more short of breath with exertion.  Although I did not observe it on my exam she does endorse she will occasionally have a rush of air after removing PMV following activity.  This could suggest some mild air trapping from the trach.  Otherwise she is doing fantastic with the trach     Plan  Cont routin     Significant Hospital Events: Including procedures, antibiotic start and stop dates in addition to other pertinent events   2/20 trach exchanged to cuffed trach, placed on vent started on  abx steroids lasix   Interim History / Subjective:   Improving  gas exchange   Objective   Blood pressure 118/84, pulse (!) 59, temperature 98.2 F (36.8 C), temperature source Oral, resp. rate (!) 23, height 5\' 6"  (1.676 m), weight (!) 177.2 kg, last menstrual period 11/29/2015, SpO2 94%.    Vent Mode: PRVC FiO2 (%):  [40 %-100 %] 50 % Set Rate:  [18 bmp-20 bmp] 18 bmp Vt Set:  [470 mL] 470 mL PEEP:  [5 cmH20-8 cmH20] 5 cmH20 Plateau Pressure:  [28 cmH20-36 cmH20] 28 cmH20   Intake/Output Summary (Last 24 hours) at 09/03/2023 1134 Last data filed at 09/03/2023 1100 Gross per 24 hour  Intake 794.46 ml  Output 1500 ml  Net -705.54 ml   Filed Weights   09/03/23 0411  Weight: (!) 177.2 kg    Examination: General: Chronically ill morbidly obese middle aged F NAD  Neuro: AAOx4 following commands  HENT: NCAT. Trach secure anicteric sclera  Lungs: BUL rhonchi. Mechanically ventilated  Cardiovascular: rr distant heart sounds  Abdomen: obese soft round ndnt  GU: external cath  Extremities: BLE edema  Skin: c/d/w  Resolved Hospital Problem list   N/a   Assessment & Plan:   AoC hypercarbic and hypoxic resp failure Bilat ASD -- edema vs infection Small bilat pleural effusions  Trach dependence OHS COPD, possible AECOPD  Hx PE  AKI, improving  -at home, cuffless (no noct vent)  -CTA chest limited regarding eval  of PE, however w hx eliquis compliance + ddimer 0.19, feel strongly unlikely that she has an acute PE  -covid flu RSV neg.  -PCT argues against bacterial PNA  -BNP was not elevated. She carries a hx of CHF.Marland Kitchen last echo in 2023 was pretty normal, without HFrEF or RV failure. Indeterminate diastolic pressures & LV hypertrophy.  P -wean MV settings -- this morning aggressively weaning FiO2. Hopefully PSV then ATC -cont IV steroids -adding triple therapy -ordered RVP -trach asp pending -abx as is for now until RVP results, then can consider de-escalating -hep gtt  -- if able to transition to ATC will change back to home eliquis -echo pending  -cont BID lasix -follow renal fxn, uop     Best Practice (right click and "Reselect all SmartList Selections" daily)   Diet/type: NPO DVT prophylaxis systemic heparin Pressure ulcer(s): N/A and pressure ulcer assessment deferred  GI prophylaxis: PPI Lines: N/A Foley:  N/A Code Status:  full code Last date of multidisciplinary goals of care discussion [husband updated at beside]   Labs   CBC: Recent Labs  Lab 09/02/23 1249 09/02/23 1302 09/02/23 1723 09/02/23 1858 09/02/23 2111 09/03/23 0317  WBC 8.4  --   --   --  7.2 6.5  NEUTROABS 6.9  --   --   --  6.0  --   HGB 14.1 17.0* 15.0 13.6 13.0 12.4  HCT 48.3* 50.0* 44.0 40.0 43.5 42.6  MCV 100.4*  --   --   --  98.2 99.1  PLT 220  --   --   --  199 194    Basic Metabolic Panel: Recent Labs  Lab 09/02/23 1249 09/02/23 1302 09/02/23 1723 09/02/23 1858 09/02/23 2111 09/03/23 0317  NA 142 142 142 141  --  141  K 4.7 4.5 4.8 4.7  --  4.9  CL 101  --   --   --   --  101  CO2 30  --   --   --   --  31  GLUCOSE 101*  --   --   --   --  119*  BUN 11  --   --   --   --  15  CREATININE 1.15*  --   --   --   --  1.07*  CALCIUM 9.2  --   --   --   --  8.8*  MG  --   --   --   --  2.1 2.3  PHOS  --   --   --   --  3.9 5.0*   GFR: Estimated Creatinine Clearance: 96.4 mL/min (A) (by C-G formula based on SCr of 1.07 mg/dL (H)). Recent Labs  Lab 09/02/23 1249 09/02/23 1302 09/02/23 1551 09/02/23 1905 09/02/23 2111 09/03/23 0317  PROCALCITON  --   --   --   --  <0.10  --   WBC 8.4  --   --   --  7.2 6.5  LATICACIDVEN  --  1.5 0.6 0.8  --   --     Liver Function Tests: Recent Labs  Lab 09/02/23 1249  AST 18  ALT 7  ALKPHOS 60  BILITOT 1.0  PROT 8.2*  ALBUMIN 3.5   No results for input(s): "LIPASE", "AMYLASE" in the last 168 hours. No results for input(s): "AMMONIA" in the last 168 hours.  ABG    Component Value Date/Time    PHART 7.335 (L) 09/02/2023 1858   PCO2ART 64.5 (H)  09/02/2023 1858   PO2ART 90 09/02/2023 1858   HCO3 34.5 (H) 09/02/2023 1858   TCO2 36 (H) 09/02/2023 1858   ACIDBASEDEF 4.0 (H) 05/01/2021 0158   O2SAT 96 09/02/2023 1858     Coagulation Profile: No results for input(s): "INR", "PROTIME" in the last 168 hours.  Cardiac Enzymes: No results for input(s): "CKTOTAL", "CKMB", "CKMBINDEX", "TROPONINI" in the last 168 hours.  HbA1C: Hemoglobin A1C  Date/Time Value Ref Range Status  06/10/2016 04:41 PM 5.4  Final    CBG: Recent Labs  Lab 09/02/23 1545 09/02/23 2044 09/02/23 2341 09/03/23 0315 09/03/23 0731  GLUCAP 104* 93 97 121* 109*    CRITICAL CARE Performed by: Lanier Clam   Total critical care time: 42 minutes  Critical care time was exclusive of separately billable procedures and treating other patients. Critical care was necessary to treat or prevent imminent or life-threatening deterioration.  Critical care was time spent personally by me on the following activities: development of treatment plan with patient and/or surrogate as well as nursing, discussions with consultants, evaluation of patient's response to treatment, examination of patient, obtaining history from patient or surrogate, ordering and performing treatments and interventions, ordering and review of laboratory studies, ordering and review of radiographic studies, pulse oximetry and re-evaluation of patient's condition.  Tessie Fass MSN, AGACNP-BC [facility] Pulmonary/Critical Care Medicine Amion for pager 09/03/2023, 11:34 AM

## 2023-09-04 DIAGNOSIS — I5033 Acute on chronic diastolic (congestive) heart failure: Secondary | ICD-10-CM

## 2023-09-04 DIAGNOSIS — J9622 Acute and chronic respiratory failure with hypercapnia: Secondary | ICD-10-CM | POA: Diagnosis not present

## 2023-09-04 DIAGNOSIS — G4733 Obstructive sleep apnea (adult) (pediatric): Secondary | ICD-10-CM

## 2023-09-04 DIAGNOSIS — J9621 Acute and chronic respiratory failure with hypoxia: Secondary | ICD-10-CM | POA: Diagnosis not present

## 2023-09-04 LAB — GLUCOSE, CAPILLARY
Glucose-Capillary: 111 mg/dL — ABNORMAL HIGH (ref 70–99)
Glucose-Capillary: 122 mg/dL — ABNORMAL HIGH (ref 70–99)
Glucose-Capillary: 98 mg/dL (ref 70–99)

## 2023-09-04 LAB — BASIC METABOLIC PANEL
Anion gap: 8 (ref 5–15)
BUN: 24 mg/dL — ABNORMAL HIGH (ref 6–20)
CO2: 35 mmol/L — ABNORMAL HIGH (ref 22–32)
Calcium: 8.9 mg/dL (ref 8.9–10.3)
Chloride: 98 mmol/L (ref 98–111)
Creatinine, Ser: 0.98 mg/dL (ref 0.44–1.00)
GFR, Estimated: >60 mL/min (ref >60)
Glucose, Bld: 83 mg/dL (ref 70–99)
Potassium: 4.6 mmol/L (ref 3.5–5.1)
Sodium: 141 mmol/L (ref 135–145)

## 2023-09-04 LAB — CBC
HCT: 43 % (ref 36.0–46.0)
Hemoglobin: 12.6 g/dL (ref 12.0–15.0)
MCH: 28.7 pg (ref 26.0–34.0)
MCHC: 29.3 g/dL — ABNORMAL LOW (ref 30.0–36.0)
MCV: 97.9 fL (ref 80.0–100.0)
Platelets: 205 10*3/uL (ref 150–400)
RBC: 4.39 MIL/uL (ref 3.87–5.11)
RDW: 14.7 % (ref 11.5–15.5)
WBC: 6.4 10*3/uL (ref 4.0–10.5)
nRBC: 0.5 % — ABNORMAL HIGH (ref 0.0–0.2)

## 2023-09-04 LAB — PROCALCITONIN: Procalcitonin: <0.1 ng/mL

## 2023-09-04 LAB — HEPARIN LEVEL (UNFRACTIONATED): Heparin Unfractionated: 0.85 [IU]/mL — ABNORMAL HIGH (ref 0.30–0.70)

## 2023-09-04 LAB — APTT: aPTT: 31 s (ref 24–36)

## 2023-09-04 MED ORDER — SODIUM CHLORIDE 0.9 % IV SOLN: INTRAVENOUS | Status: AC | PRN

## 2023-09-04 MED ORDER — FUROSEMIDE 10 MG/ML IJ SOLN
80.0000 mg | Freq: Two times a day (BID) | INTRAMUSCULAR | Status: AC
  Administered 2023-09-04: 80 mg via INTRAVENOUS
  Filled 2023-09-04: qty 8

## 2023-09-04 MED ORDER — PREDNISONE 20 MG PO TABS
20.0000 mg | ORAL_TABLET | Freq: Every day | ORAL | Status: AC
Start: 2023-09-05 — End: 2023-09-06
  Administered 2023-09-05 – 2023-09-06 (×2): 20 mg via ORAL
  Filled 2023-09-04 (×2): qty 1

## 2023-09-04 MED ORDER — INSULIN ASPART 100 UNIT/ML IJ SOLN: 1.0000 [IU] | INTRAMUSCULAR | Status: DC

## 2023-09-04 MED ORDER — FUROSEMIDE 10 MG/ML IJ SOLN
40.0000 mg | Freq: Once | INTRAMUSCULAR | Status: AC
  Administered 2023-09-04: 40 mg via INTRAVENOUS
  Filled 2023-09-04: qty 4

## 2023-09-04 NOTE — Progress Notes (Signed)
NAMEHayley Brewer, MRN:  [id number], DOB:  [birth year], LOS: 2 ADMISSION DATE:  09/02/2023, CHIEF COMPLAINT:  Respiratory Distress   History of Present Illness:   Patient is a 58yr old female with significant hx of OSA Hyperventilation Syndrome, COPD, CHF, DVT, PE on eliquis, Sleep Apnea, s/p tracheostomy (6 cuffless trach). Last Trache follow up at [facility] was 04/21/22 where she was not requiring nocturnal ventilation and her activity tolerance had improved. Patient presented today by EMS with significant shortness of breath, which began last night with the inability to fall asleep. Upon arrival to ED, patient was noted to be cyanotic on finger tips and hypoxic. Patient was placed on ventilator due to worsening respiratory failure with hypoxia and hypercarbia. PCCM was consulted for admit and further medical management.   Pertinent  Medical History   -HFpEF -History of DVT/PE -HTN -Morbid Obesity -OHS/OSA -Tracheostomy dependent  Significant Hospital Events: Including procedures, antibiotic start and stop dates in addition to other pertinent events   2/20: trach exchanged to cuffed, placed on vent, IV lasix initiated 2/21: on trach collar, denies shortness of breath, has mild secretions from trach  Interim History / Subjective:  Denies pain or shortness of breath. Mild secretions from trach  Objective   Blood pressure (!) 113/56, pulse (!) 50, temperature 98.6 F (37 C), temperature source Oral, resp. rate 19, height 5\' 6"  (1.676 m), weight (!) 177.2 kg, last menstrual period 11/29/2015, SpO2 96%.    FiO2 (%):  [40 %-50 %] 50 %   Intake/Output Summary (Last 24 hours) at 09/04/2023 0908 Last data filed at 09/04/2023 0600 Gross per 24 hour  Intake 774.48 ml  Output 2500 ml  Net -1725.52 ml   Filed Weights   09/03/23 0411 09/04/23 0400  Weight: (!) 177.2 kg (!) 177.2 kg    Examination: Physical Exam Constitutional:      Appearance: She is obese.  Neck:     Comments:  Tracheostomy tube in place Cardiovascular:     Rate and Rhythm: Normal rate and regular rhythm.     Pulses: Normal pulses.     Heart sounds: Normal heart sounds.  Pulmonary:     Effort: Pulmonary effort is normal.     Breath sounds: No wheezing.     Comments: Decreased breath sounds bilaterally Abdominal:     General: There is distension.     Palpations: Abdomen is soft.  Neurological:     General: No focal deficit present.     Mental Status: She is alert and oriented to person, place, and time. Mental status is at baseline.       Assessment & Plan:   #Acute on Chronic Hypoxic and Hypercapnic Respiratory Failure #Acute on Chronic HFpEF #OSA  OHS #Tracheostomy dependent #History of VTE  58 year old female with a history of morbid obesity and tracheostomy placement who presents to the hospital with shortness of breath, found to have acute on chronic hypoxic and hypercapnic respiratory failure with exam findings consistent with decompensated heart failure. Initiated on IV diuresis and mechanical ventilation, with improvement. Now on trach collar.  Neuro: Mental status back to baseline, avoiding psychotropic medications as able. CV: Decompensated HFpEF with notable lower extremity edema. Low BNP in the setting of morbid obesity. We have initiated IV diuresis with improvement in overall condition. Continue with IV furosemide 80 bid. Pulm: Acute on chronic hypoxic and hypercapnic respiratory failure secondary to OHS/OSA as well as decompensated heart failure. She is tracheostomy dependent and that was  exchanged to a cuffed tube. She doesn't have wheezing but does have increased in respiratory secretions compared to baseline. Currently on trachcollar and tolerating well. Quickly tapering steroids, doing 20 mg daily for 2 more days. Continue nebulizer therapy. Treating for CAP as ID below. Renal: AKI on presentation, this improved with diuresis. Kidney function now at baseline. Monitor  electrolytes daily. GI: resume diet, d/c PPI. Endo: ICU glycemic protocol Hem/Onc: history of VTE, treated with apixaban. Monitor H/H and platelets ID: presents with respiratory failure and increased secretions from trach. While secretions could be the result of heart failure, she does have a trach and is at increased risk for recurrent respiratory infections. We will cover for CAP with CTX/Azithromycin for now  Best Practice (right click and "Reselect all SmartList Selections" daily)   Diet/type: Regular consistency (see orders) DVT prophylaxis DOAC Pressure ulcer(s): N/A GI prophylaxis: N/A Lines: N/A Foley:  N/A Code Status:  full code Last date of multidisciplinary goals of care discussion [09/04/2023]  Labs   CBC: Recent Labs  Lab 09/02/23 1249 09/02/23 1302 09/02/23 1723 09/02/23 1858 09/02/23 2111 09/03/23 0317  WBC 8.4  --   --   --  7.2 6.5  NEUTROABS 6.9  --   --   --  6.0  --   HGB 14.1 17.0* 15.0 13.6 13.0 12.4  HCT 48.3* 50.0* 44.0 40.0 43.5 42.6  MCV 100.4*  --   --   --  98.2 99.1  PLT 220  --   --   --  199 194    Basic Metabolic Panel: Recent Labs  Lab 09/02/23 1249 09/02/23 1302 09/02/23 1723 09/02/23 1858 09/02/23 2111 09/03/23 0317  NA 142 142 142 141  --  141  K 4.7 4.5 4.8 4.7  --  4.9  CL 101  --   --   --   --  101  CO2 30  --   --   --   --  31  GLUCOSE 101*  --   --   --   --  119*  BUN 11  --   --   --   --  15  CREATININE 1.15*  --   --   --   --  1.07*  CALCIUM 9.2  --   --   --   --  8.8*  MG  --   --   --   --  2.1 2.3  PHOS  --   --   --   --  3.9 5.0*   GFR: Estimated Creatinine Clearance: 96.4 mL/min (A) (by C-G formula based on SCr of 1.07 mg/dL (H)). Recent Labs  Lab 09/02/23 1249 09/02/23 1302 09/02/23 1551 09/02/23 1905 09/02/23 2111 09/03/23 0317 09/04/23 0351  PROCALCITON  --   --   --   --  <0.10  --  <0.10  WBC 8.4  --   --   --  7.2 6.5  --   LATICACIDVEN  --  1.5 0.6 0.8  --   --   --     Liver Function  Tests: Recent Labs  Lab 09/02/23 1249  AST 18  ALT 7  ALKPHOS 60  BILITOT 1.0  PROT 8.2*  ALBUMIN 3.5   No results for input(s): "LIPASE", "AMYLASE" in the last 168 hours. No results for input(s): "AMMONIA" in the last 168 hours.  ABG    Component Value Date/Time   PHART 7.335 (L) 09/02/2023 1858   PCO2ART 64.5 (H) 09/02/2023  1858   PO2ART 90 09/02/2023 1858   HCO3 34.5 (H) 09/02/2023 1858   TCO2 36 (H) 09/02/2023 1858   ACIDBASEDEF 4.0 (H) 05/01/2021 0158   O2SAT 96 09/02/2023 1858     Coagulation Profile: No results for input(s): "INR", "PROTIME" in the last 168 hours.  Cardiac Enzymes: No results for input(s): "CKTOTAL", "CKMB", "CKMBINDEX", "TROPONINI" in the last 168 hours.  HbA1C: Hemoglobin A1C  Date/Time Value Ref Range Status  06/10/2016 04:41 PM 5.4  Final    CBG: Recent Labs  Lab 09/02/23 2341 09/03/23 0315 09/03/23 0731 09/03/23 1919 09/03/23 2346  GLUCAP 97 121* 109* 119* 98    Review of Systems:   Unable to obtain  Past Medical History:  She,  has a past medical history of Chronic diastolic CHF (congestive heart failure) (HCC) (01/05/2022), DVT (deep vein thrombosis) in pregnancy, Dyspnea, Essential hypertension (08/25/2017), Hyperlipidemia (11/21/2021), Morbid obesity (HCC), Obesity hypoventilation syndrome (HCC) (05/03/2021), PE (pulmonary embolism) (02/29/2016), Sleep apnea, and Tracheostomy present (HCC) (01/05/2022).   Surgical History:   Past Surgical History:  Procedure Laterality Date   CESAREAN SECTION     x 3   OPEN REDUCTION INTERNAL FIXATION (ORIF) DISTAL RADIAL FRACTURE Right 06/20/2019   Procedure: OPEN REDUCTION INTERNAL FIXATION (ORIF) DISTAL RADIAL FRACTURE;  Surgeon: Mack Hook, MD;  Location: [facility] OR;  Service: Orthopedics;  Laterality: Right;  REGIONAL BLOCK TOTAL SURGERY TIME: 90 MINUTES   SYNOVECTOMY Right 11/21/2019   Procedure: RIGHT WRIST LIGAMENT RECONSTRUCTION;  Surgeon: Mack Hook, MD;  Location: [facility] OR;   Service: Orthopedics;  Laterality: Right;  PROCEDURE: RIGHT WRIST LIGAMENT RECONSTRUCTION LENGTH OF SURGERY: 105 MIN     Social History:   reports that she has quit smoking. She has never used smokeless tobacco. She reports that she does not drink alcohol and does not use drugs.   Family History:  Her family history includes Hypertension in her sister.   Allergies No Known Allergies   Home Medications  Prior to Admission medications   Medication Sig Start Date End Date Taking? Authorizing Provider  amLODipine (NORVASC) 5 MG tablet Take 1 tablet (5 mg total) by mouth daily. 06/17/23  Yes Claiborne Rigg, NP  apixaban (ELIQUIS) 5 MG TABS tablet Take 1 tablet (5 mg total) by mouth 2 (two) times daily. 06/17/23  Yes Claiborne Rigg, NP  atorvastatin (LIPITOR) 20 MG tablet Take 1 tablet (20 mg total) by mouth daily. 06/17/23  Yes Claiborne Rigg, NP  dapagliflozin propanediol (FARXIGA) 10 MG TABS tablet Take 1 tablet (10 mg total) by mouth daily. 06/17/23  Yes Claiborne Rigg, NP  furosemide (LASIX) 20 MG tablet Take 1 tablet (20 mg total) by mouth daily. Take 40 mg or 2 tablets for the next 5 days then resume 20 mg or 1 tablet by mouth daily by mouth. 06/17/23  Yes Claiborne Rigg, NP  Misc. Devices MISC Please supply patient with tracheostomy humidifier  Z93.0 03/25/22  Yes Claiborne Rigg, NP  PRESCRIPTION MEDICATION 1 each by Other route at bedtime. CPAP- At bedtime   Yes [provider]  spironolactone (ALDACTONE) 25 MG tablet Take 1 tablet (25 mg total) by mouth daily. 06/17/23  Yes Claiborne Rigg, NP     Critical care time: 40 minutes     Raechel Chute, MD [facility] 09/04/2023 1:37 PM

## 2023-09-04 NOTE — Progress Notes (Signed)
Called E-link For 02 goals for patient. Patient is up in chair and sats at 89-91% on 12 L. Pt with hx of COPD and denies any SOB.

## 2023-09-04 NOTE — Evaluation (Signed)
[facility] Speaking Valve - Evaluation Patient Details  Name: Angel Brewer MRN: [id number] Date of Birth: 1965-09-20  Today's Date: 09/04/2023 Time: 7829-5621 SLP Time Calculation (min) (ACUTE ONLY): 15 min  Past Medical History:  Past Medical History:  Diagnosis Date   Chronic diastolic CHF (congestive heart failure) (HCC) 01/05/2022   DVT (deep vein thrombosis) in pregnancy    RLE DVT 02/2016   Dyspnea    Essential hypertension 08/25/2017   Hyperlipidemia 11/21/2021   Morbid obesity (HCC)    Obesity hypoventilation syndrome (HCC) 05/03/2021   PE (pulmonary embolism) 02/29/2016   Sleep apnea    uses a cpap   Tracheostomy present (HCC) 01/05/2022   Size 6 shiley/cuffless  Last changed: 10/9     Discussion  Stable trach.  She does get a [place] more short of breath with exertion.  Although I did not observe it on my exam she does endorse she will occasionally have a rush of air after removing PMV following activity.  This could suggest some mild air trapping from the trach.  Otherwise she is doing fantastic with the trach     Plan  Cont routin   Past Surgical History:  Past Surgical History:  Procedure Laterality Date   CESAREAN SECTION     x 3   OPEN REDUCTION INTERNAL FIXATION (ORIF) DISTAL RADIAL FRACTURE Right 06/20/2019   Procedure: OPEN REDUCTION INTERNAL FIXATION (ORIF) DISTAL RADIAL FRACTURE;  Surgeon: Mack Hook, MD;  Location: [facility] OR;  Service: Orthopedics;  Laterality: Right;  REGIONAL BLOCK TOTAL SURGERY TIME: 90 MINUTES   SYNOVECTOMY Right 11/21/2019   Procedure: RIGHT WRIST LIGAMENT RECONSTRUCTION;  Surgeon: Mack Hook, MD;  Location: [facility] OR;  Service: Orthopedics;  Laterality: Right;  PROCEDURE: RIGHT WRIST LIGAMENT RECONSTRUCTION LENGTH OF SURGERY: 105 MIN   HPI:  Angel Brewer is a 58 yo female presenting to ED 2/19 with severe hypercarbia and hypoxia requiring ventilator support. Per MD note, most recent trach change 10/9 with report of potential air  trapping when wearing PMSV. Seen by SLP throughout 2023 for goals related to dysphagia and PMSV toleration, with which pt is independent and was not recommended to continue f/u with SLP. PMH includes OSA hyperventilation syndrome, COPD, CHF, DVT, PE on Eliquis, s/p tracheostomy (#6 cuffless) 11/29/21    Assessment / Plan / Recommendation  Clinical Impression  Pt seen sitting upright in chair with personal PMSV from home in place as she has been independent with wear since 2023. Pt typically has a cuffless #6 trach, but it was changed to a cuffed trach during this admission. Education provided regarding ensuring cuff is deflated prior to placing PMSV, with which pt verbalized understanding. Observed pt donning and doffing PMSV without difficulty x2. There was no notable back pressure observed. Vital signs remained stable throughout the session. She achieves a strong quality of voice and is 100% intelligible. Pt states she frequently forgets to remove PMSV when sleeping, of which SLP encouraged pt to be diligent. Recommend pt continue to wear PMSV during all waking hours. Posted sign at [facility] to reinforce recommendations and remind pt to remove PMSV when sleeping. SLP will f/u at least once more to ensure understanding of education. SLP Visit Diagnosis: Aphonia (R49.1)    SLP Assessment  Patient needs continued Speech Lanaguage Pathology Services    Recommendations for follow up therapy are one component of a multi-disciplinary discharge planning process, led by the attending physician.  Recommendations may be updated based on patient status, additional functional criteria and insurance authorization.  Follow Up Recommendations  No SLP follow up    Assistance Recommended at Discharge PRN  Functional Status Assessment Patient has had a recent decline in their functional status and demonstrates the ability to make significant improvements in function in a reasonable and predictable amount of time.   Frequency and Duration min 1 x/week  1 week    PMSV Trial PMSV was placed for: >15 minutes Able to redirect subglottic air through upper airway: Yes Able to Attain Phonation: Yes Voice Quality: Normal Able to Expectorate Secretions: No attempts Level of Secretion Expectoration with PMSV: Not observed Breath Support for Phonation: Adequate Intelligibility: Intelligible Respirations During Trial: 15 SpO2 During Trial: 90 % Pulse During Trial: 60 Behavior: Alert;Cooperative;Expresses self well;Good eye contact   Tracheostomy Tube       Vent Dependency  FiO2 (%): 50 %    Cuff Deflation Trial Tolerated Cuff Deflation:  (deflated at baseline)         Gwynneth Aliment, M.A., CF-SLP Speech Language Pathology, Acute Rehabilitation Services  Secure Chat preferred [phone]  09/04/2023, 4:42 PM

## 2023-09-04 NOTE — TOC Progression Note (Signed)
Transition of Care [facility]) - Progression Note    Patient Details  Name: Angel Brewer MRN: [id number] Date of Birth: Aug 27, 1965  [facility] CM/SW Contact  Tom-Johnson, Hershal Coria, RN Phone Number: 09/04/2023, 2:02 PM  Clinical Narrative:     Patient continues on Trach/Vent. CM spoke with patient at bedside, patient able to communicate with low monotone.  Patient states she lives alone, has three children, one lives locally but not as supportive. Has three siblings, somehow supportive. Independent with care and drive self prior to admit. Currently employed part-time. Has a cane and walker at home if needed.  Patient states she receives her Janina Mayo supplies from the [facility] at Pulmonary Rehab during her q 3 mths visits. CM called the [facility] to verify and spoke with Okey Regal. Okey Regal will check with patient to see what she needs at home prior discharge.   Patient not Medically ready for discharge.  CM will continue to follow as patient progresses with care towards discharge.               Expected Discharge Plan and Services                                               Social Determinants of Health (SDOH) Interventions SDOH Screenings   Food Insecurity: Patient Declined (09/03/2023)  Recent Concern: Food Insecurity - Food Insecurity Present (06/16/2023)  Housing: Patient Declined (09/03/2023)  Transportation Needs: Patient Declined (09/03/2023)  Utilities: Patient Declined (09/03/2023)  Depression (PHQ2-9): Low Risk  (06/17/2023)  Financial Resource Strain: Medium Risk (06/16/2023)  Physical Activity: Sufficiently Active (06/16/2023)  Social Connections: Socially Isolated (06/16/2023)  Stress: No Stress Concern Present (06/16/2023)  Tobacco Use: Medium Risk (09/02/2023)  Health Literacy: Adequate Health Literacy (06/17/2023)    Readmission Risk Interventions    09/03/2023    1:17 PM 12/20/2021   11:33 AM  Readmission Risk Prevention Plan   Post Dischage Appt Complete   Medication Screening Complete   Transportation Screening Complete Complete  PCP or Specialist Appt within 3-5 Days  Complete  HRI or Home Care Consult  Complete  Social Work Consult for Recovery Care Planning/Counseling  Complete  Palliative Care Screening  Not Applicable  Medication Review Oceanographer)  Complete

## 2023-09-05 DIAGNOSIS — J9601 Acute respiratory failure with hypoxia: Secondary | ICD-10-CM

## 2023-09-05 LAB — BASIC METABOLIC PANEL
Anion gap: 15 (ref 5–15)
BUN: 31 mg/dL — ABNORMAL HIGH (ref 6–20)
CO2: 35 mmol/L — ABNORMAL HIGH (ref 22–32)
Calcium: 9.1 mg/dL (ref 8.9–10.3)
Chloride: 89 mmol/L — ABNORMAL LOW (ref 98–111)
Creatinine, Ser: 1 mg/dL (ref 0.44–1.00)
GFR, Estimated: >60 mL/min (ref >60)
Glucose, Bld: 105 mg/dL — ABNORMAL HIGH (ref 70–99)
Potassium: 3.6 mmol/L (ref 3.5–5.1)
Sodium: 139 mmol/L (ref 135–145)

## 2023-09-05 LAB — CBC
HCT: 44.5 % (ref 36.0–46.0)
Hemoglobin: 13.4 g/dL (ref 12.0–15.0)
MCH: 29.1 pg (ref 26.0–34.0)
MCHC: 30.1 g/dL (ref 30.0–36.0)
MCV: 96.5 fL (ref 80.0–100.0)
Platelets: 200 10*3/uL (ref 150–400)
RBC: 4.61 MIL/uL (ref 3.87–5.11)
RDW: 14.6 % (ref 11.5–15.5)
WBC: 5.9 10*3/uL (ref 4.0–10.5)
nRBC: 0 % (ref 0.0–0.2)

## 2023-09-05 LAB — GLUCOSE, CAPILLARY
Glucose-Capillary: 102 mg/dL — ABNORMAL HIGH (ref 70–99)
Glucose-Capillary: 67 mg/dL — ABNORMAL LOW (ref 70–99)
Glucose-Capillary: 119 mg/dL — ABNORMAL HIGH (ref 70–99)
Glucose-Capillary: 109 mg/dL — ABNORMAL HIGH (ref 70–99)
Glucose-Capillary: 127 mg/dL — ABNORMAL HIGH (ref 70–99)
Glucose-Capillary: 127 mg/dL — ABNORMAL HIGH (ref 70–99)

## 2023-09-05 LAB — LEGIONELLA PNEUMOPHILA SEROGP 1 UR AG: L. pneumophila Serogp 1 Ur Ag: NEGATIVE

## 2023-09-05 NOTE — Progress Notes (Signed)
 PROGRESS NOTE    Angel Brewer  ZOX:[id number] DOB: 1965/09/06 DOA: 09/02/2023 PCP: Claiborne Rigg, NP    Brief Narrative:  This morbidly obese 58 year old female was admitted on 09/02/2023 with shortness of breath, which began last night with the inability to fall asleep. Upon arrival to ED, patient was noted to be hypoxic and cyanotic. She has a tracheostomy and was placed on mechanical ventilatory support due to worsening respiratory failure with hypoxia and hypercarbia. PCCM was consulted for admission and further medical management. Productive cough. Afebrile. Bilateral rhonchi on exam. RSV/COVID/Flu NEGATIVE. Blood cultures PENDING. BNP 74.2. CTA showed no definite pulmonary embolism but did show bilateral airspace disease and small pleural effusions.    Assessment and Plan: Acute on Chronic Hypoxic and Hypercapnic Respiratory Failure due to Acute on Chronic HFpEF/Tracheostomy dependent - presents to the hospital with shortness of breath, found to have acute on chronic hypoxic and hypercapnic respiratory failure with exam findings consistent with decompensated heart failure. - IV diuresis and mechanical ventilation, with improvement- trach collar -echo done -Quickly tapering steroids:  20 mg daily for 2 more days.  -Continue nebulizer therapy.  -Treating for CAP  -continue to wean O2  OSA  OHS -was to start wearing O2 at night  H/o VTE -on eliquis  Hypoglycemia -resolved  Obesity Estimated body mass index is 63.05 kg/m as calculated from the following:   Height as of this encounter: 5\' 6"  (1.676 m).   Weight as of this encounter: 177.2 kg.   DVT prophylaxis: SCDs Start: 09/02/23 1754 apixaban (ELIQUIS) tablet 5 mg    Code Status: Full Code   Disposition Plan:  Level of care: Progressive Status is: Inpatient     Consultants:  PCCM   Subjective: Wants to get OOB  Objective: Vitals:   09/05/23 0800 09/05/23 0900 09/05/23 1100 09/05/23 1120  BP: 111/68  119/62    Pulse: (!) 55 (!) 55  (!) 50  Resp: 20 (!) 23  (!) 23  Temp:   97.9 F (36.6 C)   TempSrc:   Oral   SpO2: 97% 97%  93%  Weight:      Height:        Intake/Output Summary (Last 24 hours) at 09/05/2023 1348 Last data filed at 09/05/2023 0900 Gross per 24 hour  Intake 693.21 ml  Output 2500 ml  Net -1806.79 ml   Filed Weights   09/03/23 0411 09/04/23 0400  Weight: (!) 177.2 kg (!) 177.2 kg    Examination:   General: Appearance:    Severely obese female in no acute distress     Lungs:    diminished, respirations unlabored  Heart:    Bradycardic.    MS:   All extremities are intact.    Neurologic:   Awake, alert       Data Reviewed: I have personally reviewed following labs and imaging studies  CBC: Recent Labs  Lab 09/02/23 1249 09/02/23 1302 09/02/23 1858 09/02/23 2111 09/03/23 0317 09/04/23 0816 09/05/23 0838  WBC 8.4  --   --  7.2 6.5 6.4 5.9  NEUTROABS 6.9  --   --  6.0  --   --   --   HGB 14.1   < > 13.6 13.0 12.4 12.6 13.4  HCT 48.3*   < > 40.0 43.5 42.6 43.0 44.5  MCV 100.4*  --   --  98.2 99.1 97.9 96.5  PLT 220  --   --  199 194 205 200   < > =  values in this interval not displayed.   Basic Metabolic Panel: Recent Labs  Lab 09/02/23 1249 09/02/23 1302 09/02/23 1723 09/02/23 1858 09/02/23 2111 09/03/23 0317 09/04/23 0816 09/05/23 0838  NA 142   < > 142 141  --  141 141 139  K 4.7   < > 4.8 4.7  --  4.9 4.6 3.6  CL 101  --   --   --   --  101 98 89*  CO2 30  --   --   --   --  31 35* 35*  GLUCOSE 101*  --   --   --   --  119* 83 105*  BUN 11  --   --   --   --  15 24* 31*  CREATININE 1.15*  --   --   --   --  1.07* 0.98 1.00  CALCIUM 9.2  --   --   --   --  8.8* 8.9 9.1  MG  --   --   --   --  2.1 2.3  --   --   PHOS  --   --   --   --  3.9 5.0*  --   --    < > = values in this interval not displayed.   GFR: Estimated Creatinine Clearance: 103.1 mL/min (by C-G formula based on SCr of 1 mg/dL). Liver Function Tests: Recent  Labs  Lab 09/02/23 1249  AST 18  ALT 7  ALKPHOS 60  BILITOT 1.0  PROT 8.2*  ALBUMIN 3.5   No results for input(s): "LIPASE", "AMYLASE" in the last 168 hours. No results for input(s): "AMMONIA" in the last 168 hours. Coagulation Profile: No results for input(s): "INR", "PROTIME" in the last 168 hours. Cardiac Enzymes: No results for input(s): "CKTOTAL", "CKMB", "CKMBINDEX", "TROPONINI" in the last 168 hours. BNP (last 3 results) No results for input(s): "PROBNP" in the last 8760 hours. HbA1C: No results for input(s): "HGBA1C" in the last 72 hours. CBG: Recent Labs  Lab 09/04/23 1932 09/04/23 2332 09/05/23 0339 09/05/23 0732 09/05/23 1118  GLUCAP 111* 122* 102* 67* 119*   Lipid Profile: No results for input(s): "CHOL", "HDL", "LDLCALC", "TRIG", "CHOLHDL", "LDLDIRECT" in the last 72 hours. Thyroid Function Tests: No results for input(s): "TSH", "T4TOTAL", "FREET4", "T3FREE", "THYROIDAB" in the last 72 hours. Anemia Panel: No results for input(s): "VITAMINB12", "FOLATE", "FERRITIN", "TIBC", "IRON", "RETICCTPCT" in the last 72 hours. Sepsis Labs: Recent Labs  Lab 09/02/23 1302 09/02/23 1551 09/02/23 1905 09/02/23 2111 09/04/23 0351  PROCALCITON  --   --   --  <0.10 <0.10  LATICACIDVEN 1.5 0.6 0.8  --   --     Recent Results (from the past 240 hours)  Resp panel by RT-PCR (RSV, Flu A&B, Covid) Anterior Nasal Swab     Status: None   Collection Time: 09/02/23 12:34 PM   Specimen: Anterior Nasal Swab  Result Value Ref Range Status   SARS Coronavirus 2 by RT PCR NEGATIVE NEGATIVE Final   Influenza A by PCR NEGATIVE NEGATIVE Final   Influenza B by PCR NEGATIVE NEGATIVE Final    Comment: (NOTE) The Xpert Xpress SARS-CoV-2/FLU/RSV plus assay is intended as an aid in the diagnosis of influenza from Nasopharyngeal swab specimens and should not be used as a sole basis for treatment. Nasal washings and aspirates are unacceptable for Xpert Xpress  SARS-CoV-2/FLU/RSV testing.  Fact Sheet for Patients: BloggerCourse.com  Fact Sheet for Healthcare Providers: SeriousBroker.it  This test  is not yet approved or cleared by the [place] and has been authorized for detection and/or diagnosis of SARS-CoV-2 by FDA under an Emergency Use Authorization (EUA). This EUA will remain in effect (meaning this test can be used) for the duration of the COVID-19 declaration under Section 564(b)(1) of the Act, 21 U.S.C. section 360bbb-3(b)(1), unless the authorization is terminated or revoked.     Resp Syncytial Virus by PCR NEGATIVE NEGATIVE Final    Comment: (NOTE) Fact Sheet for Patients: BloggerCourse.com  Fact Sheet for Healthcare Providers: SeriousBroker.it  This test is not yet approved or cleared by the [place] FDA and has been authorized for detection and/or diagnosis of SARS-CoV-2 by FDA under an Emergency Use Authorization (EUA). This EUA will remain in effect (meaning this test can be used) for the duration of the COVID-19 declaration under Section 564(b)(1) of the Act, 21 U.S.C. section 360bbb-3(b)(1), unless the authorization is terminated or revoked.  Performed at [facility], [address] [place], [place] [address]   Culture, blood (routine x 2)     Status: None (Preliminary result)   Collection Time: 09/02/23 12:49 PM   Specimen: BLOOD RIGHT ARM  Result Value Ref Range Status   Specimen Description BLOOD RIGHT ARM  Final   Special Requests   Final    BOTTLES DRAWN AEROBIC AND ANAEROBIC Blood Culture results may not be optimal due to an inadequate volume of blood received in culture bottles   Culture   Final    NO GROWTH 3 DAYS Performed at [facility], [address], [place], [place] [address]    Report Status PENDING  Incomplete  Culture, blood (routine x 2)     Status: None (Preliminary  result)   Collection Time: 09/02/23  1:50 PM   Specimen: BLOOD RIGHT ARM  Result Value Ref Range Status   Specimen Description BLOOD RIGHT ARM  Final   Special Requests   Final    AEROBIC BOTTLE ONLY Blood Culture results may not be optimal due to an inadequate volume of blood received in culture bottles   Culture   Final    NO GROWTH 3 DAYS Performed at [facility], [address], [place], [place] [address]    Report Status PENDING  Incomplete  MRSA Next Gen by PCR, Nasal     Status: None   Collection Time: 09/02/23  8:59 PM   Specimen: Nasal Mucosa; Nasal Swab  Result Value Ref Range Status   MRSA by PCR Next Gen NOT DETECTED NOT DETECTED Final    Comment: (NOTE) The GeneXpert MRSA Assay (FDA approved for NASAL specimens only), is one component of a comprehensive MRSA colonization surveillance program. It is not intended to diagnose MRSA infection nor to guide or monitor treatment for MRSA infections. Test performance is not FDA approved in patients less than 65 years old. Performed at [facility], [address], [place], [place] [address]   Respiratory (~20 pathogens) panel by PCR     Status: None   Collection Time: 09/03/23  5:01 PM   Specimen: Nasopharyngeal Swab; Respiratory  Result Value Ref Range Status   Adenovirus NOT DETECTED NOT DETECTED Final   Coronavirus 229E NOT DETECTED NOT DETECTED Final    Comment: (NOTE) The Coronavirus on the Respiratory Panel, DOES NOT test for the novel  Coronavirus (2019 nCoV)    Coronavirus HKU1 NOT DETECTED NOT DETECTED Final   Coronavirus NL63 NOT DETECTED NOT DETECTED Final  Coronavirus OC43 NOT DETECTED NOT DETECTED Final   Metapneumovirus NOT DETECTED NOT DETECTED Final   Rhinovirus / Enterovirus NOT DETECTED NOT DETECTED Final   Influenza A NOT DETECTED NOT DETECTED Final   Influenza B NOT DETECTED NOT DETECTED Final   Parainfluenza Virus 1 NOT DETECTED NOT DETECTED Final   Parainfluenza Virus 2 NOT DETECTED  NOT DETECTED Final   Parainfluenza Virus 3 NOT DETECTED NOT DETECTED Final   Parainfluenza Virus 4 NOT DETECTED NOT DETECTED Final   Respiratory Syncytial Virus NOT DETECTED NOT DETECTED Final   Bordetella pertussis NOT DETECTED NOT DETECTED Final   Bordetella Parapertussis NOT DETECTED NOT DETECTED Final   Chlamydophila pneumoniae NOT DETECTED NOT DETECTED Final   Mycoplasma pneumoniae NOT DETECTED NOT DETECTED Final    Comment: Performed at [facility], [address], [place], [place] [address]         Radiology Studies: No results found.      Scheduled Meds:  apixaban  5 mg Oral BID   arformoterol  15 mcg Nebulization BID   atorvastatin  20 mg Oral Daily   budesonide (PULMICORT) nebulizer solution  0.5 mg Nebulization BID   Chlorhexidine Gluconate Cloth  6 each Topical Daily   docusate sodium  100 mg Oral BID   insulin aspart  1-3 Units Subcutaneous Q4H   mouth rinse  15 mL Mouth Rinse 4 times per day   polyethylene glycol  17 g Oral Daily   predniSONE  20 mg Oral Q breakfast   revefenacin  175 mcg Nebulization Daily   spironolactone  25 mg Oral Daily   Continuous Infusions:  sodium chloride Stopped (09/04/23 2130)   cefTRIAXone (ROCEPHIN)  IV Stopped (09/04/23 2011)     LOS: 3 days    Time spent: 45 minutes spent on chart review, discussion with nursing staff, consultants, updating family and interview/physical exam; more than 50% of that time was spent in counseling and/or coordination of care.    Joseph Art, DO [facility] Available via Epic secure chat 7am-7pm After these hours, please refer to coverage provider listed on [email or web address] 09/05/2023, 1:48 PM

## 2023-09-06 DIAGNOSIS — J9601 Acute respiratory failure with hypoxia: Secondary | ICD-10-CM | POA: Diagnosis not present

## 2023-09-06 LAB — CBC
HCT: 45.1 % (ref 36.0–46.0)
Hemoglobin: 13.5 g/dL (ref 12.0–15.0)
MCH: 29 pg (ref 26.0–34.0)
MCHC: 29.9 g/dL — ABNORMAL LOW (ref 30.0–36.0)
MCV: 97 fL (ref 80.0–100.0)
Platelets: 210 10*3/uL (ref 150–400)
RBC: 4.65 MIL/uL (ref 3.87–5.11)
RDW: 14.5 % (ref 11.5–15.5)
WBC: 5.8 10*3/uL (ref 4.0–10.5)
nRBC: 0 % (ref 0.0–0.2)

## 2023-09-06 LAB — BASIC METABOLIC PANEL
Anion gap: 8 (ref 5–15)
BUN: 30 mg/dL — ABNORMAL HIGH (ref 6–20)
CO2: 40 mmol/L — ABNORMAL HIGH (ref 22–32)
Calcium: 9 mg/dL (ref 8.9–10.3)
Chloride: 91 mmol/L — ABNORMAL LOW (ref 98–111)
Creatinine, Ser: 0.9 mg/dL (ref 0.44–1.00)
GFR, Estimated: >60 mL/min (ref >60)
Glucose, Bld: 97 mg/dL (ref 70–99)
Potassium: 3.8 mmol/L (ref 3.5–5.1)
Sodium: 139 mmol/L (ref 135–145)

## 2023-09-06 LAB — GLUCOSE, CAPILLARY
Glucose-Capillary: 118 mg/dL — ABNORMAL HIGH (ref 70–99)
Glucose-Capillary: 140 mg/dL — ABNORMAL HIGH (ref 70–99)
Glucose-Capillary: 140 mg/dL — ABNORMAL HIGH (ref 70–99)
Glucose-Capillary: 178 mg/dL — ABNORMAL HIGH (ref 70–99)
Glucose-Capillary: 79 mg/dL (ref 70–99)
Glucose-Capillary: 85 mg/dL (ref 70–99)
Glucose-Capillary: 98 mg/dL (ref 70–99)

## 2023-09-06 NOTE — Plan of Care (Signed)
  Problem: Education: Goal: Knowledge of General Education information will improve Description: Including pain rating scale, medication(s)/side effects and non-pharmacologic comfort measures Outcome: Progressing   Problem: Clinical Measurements: Goal: Will remain free from infection Outcome: Progressing   Problem: Activity: Goal: Risk for activity intolerance will decrease Outcome: Progressing   Problem: Activity: Goal: Ability to tolerate increased activity will improve Outcome: Progressing   Problem: Respiratory: Goal: Patent airway maintenance will improve Outcome: Progressing

## 2023-09-06 NOTE — Progress Notes (Signed)
 PROGRESS NOTE    Angel Brewer  ZOX:[id number] DOB: [birth year] DOA: 09/02/2023 PCP: Claiborne Rigg, NP    Brief Narrative:  This morbidly obese 58 year old female was admitted on 09/02/2023 with shortness of breath, which began last night with the inability to fall asleep. Upon arrival to ED, patient was noted to be hypoxic and cyanotic. She has a tracheostomy and was placed on mechanical ventilatory support due to worsening respiratory failure with hypoxia and hypercarbia. PCCM was consulted for admission and further medical management. Productive cough. Afebrile. Bilateral rhonchi on exam. RSV/COVID/Flu NEGATIVE.Marland Kitchen BNP 74.2. CTA showed no definite pulmonary embolism but did show bilateral airspace disease and small pleural effusions. Given IV diuresis and working to wean O2  (only wears O2 at night)   Assessment and Plan: Acute on Chronic Hypoxic and Hypercapnic Respiratory Failure due to Acute on Chronic HFpEF/Tracheostomy dependent - presents to the hospital with shortness of breath, found to have acute on chronic hypoxic and hypercapnic respiratory failure with exam findings consistent with decompensated heart failure. - s/p IV diuresis and mechanical ventilation, with improvement- trach collar -echo done -s/p steroids -Continue nebulizer therapy.  -Treating for CAP  -continue to wean O2  OSA  OHS -was to start wearing O2 at night  H/o VTE -on eliquis  Hypoglycemia -resolved  Obesity Estimated body mass index is 62.06 kg/m as calculated from the following:   Height as of this encounter: 5\' 6"  (1.676 m).   Weight as of this encounter: 174.4 kg.   DVT prophylaxis: SCDs Start: 09/02/23 1754 apixaban (ELIQUIS) tablet 5 mg    Code Status: Full Code   Disposition Plan:  Level of care: Progressive Status is: Inpatient     Consultants:  PCCM   Subjective: Slept well, no issues  Objective: Vitals:   09/06/23 1000 09/06/23 1025 09/06/23 1100 09/06/23 1120  BP:   (!) 140/78 127/84   Pulse: (!) 52 (!) 57 (!) 52 (!) 50  Resp: 18 18 20 18   Temp:      TempSrc:      SpO2: 100% 99% 100% 98%  Weight:      Height:        Intake/Output Summary (Last 24 hours) at 09/06/2023 1311 Last data filed at 09/06/2023 0600 Gross per 24 hour  Intake 100 ml  Output 950 ml  Net -850 ml   Filed Weights   09/03/23 0411 09/04/23 0400 09/06/23 0500  Weight: (!) 177.2 kg (!) 177.2 kg (!) 174.4 kg    Examination:   General: Appearance:    Severely obese female in no acute distress     Lungs:    diminished, respirations unlabored  Heart:    Bradycardic.    MS:   All extremities are intact.    Neurologic:   Awake, alert       Data Reviewed: I have personally reviewed following labs and imaging studies  CBC: Recent Labs  Lab 09/02/23 1249 09/02/23 1302 09/02/23 2111 09/03/23 0317 09/04/23 0816 09/05/23 0838 09/06/23 0226  WBC 8.4  --  7.2 6.5 6.4 5.9 5.8  NEUTROABS 6.9  --  6.0  --   --   --   --   HGB 14.1   < > 13.0 12.4 12.6 13.4 13.5  HCT 48.3*   < > 43.5 42.6 43.0 44.5 45.1  MCV 100.4*  --  98.2 99.1 97.9 96.5 97.0  PLT 220  --  199 194 205 200 210   < > = values  in this interval not displayed.   Basic Metabolic Panel: Recent Labs  Lab 09/02/23 1249 09/02/23 1302 09/02/23 1858 09/02/23 2111 09/03/23 0317 09/04/23 0816 09/05/23 0838 09/06/23 0226  NA 142   < > 141  --  141 141 139 139  K 4.7   < > 4.7  --  4.9 4.6 3.6 3.8  CL 101  --   --   --  101 98 89* 91*  CO2 30  --   --   --  31 35* 35* 40*  GLUCOSE 101*  --   --   --  119* 83 105* 97  BUN 11  --   --   --  15 24* 31* 30*  CREATININE 1.15*  --   --   --  1.07* 0.98 1.00 0.90  CALCIUM 9.2  --   --   --  8.8* 8.9 9.1 9.0  MG  --   --   --  2.1 2.3  --   --   --   PHOS  --   --   --  3.9 5.0*  --   --   --    < > = values in this interval not displayed.   GFR: Estimated Creatinine Clearance: 113.3 mL/min (by C-G formula based on SCr of 0.9 mg/dL). Liver Function  Tests: Recent Labs  Lab 09/02/23 1249  AST 18  ALT 7  ALKPHOS 60  BILITOT 1.0  PROT 8.2*  ALBUMIN 3.5   No results for input(s): "LIPASE", "AMYLASE" in the last 168 hours. No results for input(s): "AMMONIA" in the last 168 hours. Coagulation Profile: No results for input(s): "INR", "PROTIME" in the last 168 hours. Cardiac Enzymes: No results for input(s): "CKTOTAL", "CKMB", "CKMBINDEX", "TROPONINI" in the last 168 hours. BNP (last 3 results) No results for input(s): "PROBNP" in the last 8760 hours. HbA1C: No results for input(s): "HGBA1C" in the last 72 hours. CBG: Recent Labs  Lab 09/05/23 1919 09/05/23 2315 09/06/23 0307 09/06/23 0924 09/06/23 1152  GLUCAP 127* 127* 85 98 79   Lipid Profile: No results for input(s): "CHOL", "HDL", "LDLCALC", "TRIG", "CHOLHDL", "LDLDIRECT" in the last 72 hours. Thyroid Function Tests: No results for input(s): "TSH", "T4TOTAL", "FREET4", "T3FREE", "THYROIDAB" in the last 72 hours. Anemia Panel: No results for input(s): "VITAMINB12", "FOLATE", "FERRITIN", "TIBC", "IRON", "RETICCTPCT" in the last 72 hours. Sepsis Labs: Recent Labs  Lab 09/02/23 1302 09/02/23 1551 09/02/23 1905 09/02/23 2111 09/04/23 0351  PROCALCITON  --   --   --  <0.10 <0.10  LATICACIDVEN 1.5 0.6 0.8  --   --     Recent Results (from the past 240 hours)  Resp panel by RT-PCR (RSV, Flu A&B, Covid) Anterior Nasal Swab     Status: None   Collection Time: 09/02/23 12:34 PM   Specimen: Anterior Nasal Swab  Result Value Ref Range Status   SARS Coronavirus 2 by RT PCR NEGATIVE NEGATIVE Final   Influenza A by PCR NEGATIVE NEGATIVE Final   Influenza B by PCR NEGATIVE NEGATIVE Final    Comment: (NOTE) The Xpert Xpress SARS-CoV-2/FLU/RSV plus assay is intended as an aid in the diagnosis of influenza from Nasopharyngeal swab specimens and should not be used as a sole basis for treatment. Nasal washings and aspirates are unacceptable for Xpert Xpress  SARS-CoV-2/FLU/RSV testing.  Fact Sheet for Patients: BloggerCourse.com  Fact Sheet for Healthcare Providers: SeriousBroker.it  This test is not yet approved or cleared by the [place] FDA and has  been authorized for detection and/or diagnosis of SARS-CoV-2 by FDA under an Emergency Use Authorization (EUA). This EUA will remain in effect (meaning this test can be used) for the duration of the COVID-19 declaration under Section 564(b)(1) of the Act, 21 U.S.C. section 360bbb-3(b)(1), unless the authorization is terminated or revoked.     Resp Syncytial Virus by PCR NEGATIVE NEGATIVE Final    Comment: (NOTE) Fact Sheet for Patients: BloggerCourse.com  Fact Sheet for Healthcare Providers: SeriousBroker.it  This test is not yet approved or cleared by the [place] FDA and has been authorized for detection and/or diagnosis of SARS-CoV-2 by FDA under an Emergency Use Authorization (EUA). This EUA will remain in effect (meaning this test can be used) for the duration of the COVID-19 declaration under Section 564(b)(1) of the Act, 21 U.S.C. section 360bbb-3(b)(1), unless the authorization is terminated or revoked.  Performed at [facility], [address] [place], [place] [address]   Culture, blood (routine x 2)     Status: None (Preliminary result)   Collection Time: 09/02/23 12:49 PM   Specimen: BLOOD RIGHT ARM  Result Value Ref Range Status   Specimen Description BLOOD RIGHT ARM  Final   Special Requests   Final    BOTTLES DRAWN AEROBIC AND ANAEROBIC Blood Culture results may not be optimal due to an inadequate volume of blood received in culture bottles   Culture   Final    NO GROWTH 4 DAYS Performed at [facility], [address], [place], [place] [address]    Report Status PENDING  Incomplete  Culture, blood (routine x 2)     Status: None (Preliminary  result)   Collection Time: 09/02/23  1:50 PM   Specimen: BLOOD RIGHT ARM  Result Value Ref Range Status   Specimen Description BLOOD RIGHT ARM  Final   Special Requests   Final    AEROBIC BOTTLE ONLY Blood Culture results may not be optimal due to an inadequate volume of blood received in culture bottles   Culture   Final    NO GROWTH 4 DAYS Performed at [facility], [address], [place], [place] [address]    Report Status PENDING  Incomplete  MRSA Next Gen by PCR, Nasal     Status: None   Collection Time: 09/02/23  8:59 PM   Specimen: Nasal Mucosa; Nasal Swab  Result Value Ref Range Status   MRSA by PCR Next Gen NOT DETECTED NOT DETECTED Final    Comment: (NOTE) The GeneXpert MRSA Assay (FDA approved for NASAL specimens only), is one component of a comprehensive MRSA colonization surveillance program. It is not intended to diagnose MRSA infection nor to guide or monitor treatment for MRSA infections. Test performance is not FDA approved in patients less than 58 years old. Performed at [facility], [address], [place], [place] [address]   Respiratory (~20 pathogens) panel by PCR     Status: None   Collection Time: 09/03/23  5:01 PM   Specimen: Nasopharyngeal Swab; Respiratory  Result Value Ref Range Status   Adenovirus NOT DETECTED NOT DETECTED Final   Coronavirus 229E NOT DETECTED NOT DETECTED Final    Comment: (NOTE) The Coronavirus on the Respiratory Panel, DOES NOT test for the novel  Coronavirus (2019 nCoV)    Coronavirus HKU1 NOT DETECTED NOT DETECTED Final   Coronavirus NL63 NOT DETECTED NOT DETECTED Final   Coronavirus OC43 NOT DETECTED NOT DETECTED Final   Metapneumovirus NOT  DETECTED NOT DETECTED Final   Rhinovirus / Enterovirus NOT DETECTED NOT DETECTED Final   Influenza A NOT DETECTED NOT DETECTED Final   Influenza B NOT DETECTED NOT DETECTED Final   Parainfluenza Virus 1 NOT DETECTED NOT DETECTED Final   Parainfluenza Virus 2 NOT DETECTED  NOT DETECTED Final   Parainfluenza Virus 3 NOT DETECTED NOT DETECTED Final   Parainfluenza Virus 4 NOT DETECTED NOT DETECTED Final   Respiratory Syncytial Virus NOT DETECTED NOT DETECTED Final   Bordetella pertussis NOT DETECTED NOT DETECTED Final   Bordetella Parapertussis NOT DETECTED NOT DETECTED Final   Chlamydophila pneumoniae NOT DETECTED NOT DETECTED Final   Mycoplasma pneumoniae NOT DETECTED NOT DETECTED Final    Comment: Performed at [facility], [address], [place], [place] [address]         Radiology Studies: No results found.      Scheduled Meds:  apixaban  5 mg Oral BID   arformoterol  15 mcg Nebulization BID   atorvastatin  20 mg Oral Daily   budesonide (PULMICORT) nebulizer solution  0.5 mg Nebulization BID   Chlorhexidine Gluconate Cloth  6 each Topical Daily   docusate sodium  100 mg Oral BID   insulin aspart  1-3 Units Subcutaneous Q4H   mouth rinse  15 mL Mouth Rinse 4 times per day   polyethylene glycol  17 g Oral Daily   revefenacin  175 mcg Nebulization Daily   spironolactone  25 mg Oral Daily   Continuous Infusions:  cefTRIAXone (ROCEPHIN)  IV Stopped (09/05/23 1917)     LOS: 4 days    Time spent: 45 minutes spent on chart review, discussion with nursing staff, consultants, updating family and interview/physical exam; more than 50% of that time was spent in counseling and/or coordination of care.    Joseph Art, DO [facility] Available via Epic secure chat 7am-7pm After these hours, please refer to coverage provider listed on [email or web address] 09/06/2023, 1:11 PM

## 2023-09-07 DIAGNOSIS — R0689 Other abnormalities of breathing: Secondary | ICD-10-CM | POA: Diagnosis not present

## 2023-09-07 LAB — CULTURE, BLOOD (ROUTINE X 2)
Culture: NO GROWTH
Culture: NO GROWTH

## 2023-09-07 LAB — GLUCOSE, CAPILLARY
Glucose-Capillary: 87 mg/dL (ref 70–99)
Glucose-Capillary: 93 mg/dL (ref 70–99)
Glucose-Capillary: 92 mg/dL (ref 70–99)
Glucose-Capillary: 89 mg/dL (ref 70–99)
Glucose-Capillary: 131 mg/dL — ABNORMAL HIGH (ref 70–99)
Glucose-Capillary: 88 mg/dL (ref 70–99)

## 2023-09-07 NOTE — Plan of Care (Signed)
°  Problem: Education: Goal: Knowledge of General Education information will improve Description: Including pain rating scale, medication(s)/side effects and non-pharmacologic comfort measures Outcome: Progressing   Problem: Clinical Measurements: Goal: Will remain free from infection Outcome: Progressing Goal: Diagnostic test results will improve Outcome: Progressing Goal: Respiratory complications will improve Outcome: Progressing Goal: Cardiovascular complication will be avoided Outcome: Progressing   Problem: Nutrition: Goal: Adequate nutrition will be maintained Outcome: Progressing   

## 2023-09-07 NOTE — Progress Notes (Signed)
 PROGRESS NOTE    Angel Brewer  ZOX:[id number] DOB: 1965/10/15 DOA: 09/02/2023 PCP: Claiborne Rigg, NP    Brief Narrative:  This morbidly obese 58 year old female was admitted on 09/02/2023 with shortness of breath, which began last night with the inability to fall asleep. Upon arrival to ED, patient was noted to be hypoxic and cyanotic. She has a tracheostomy and was placed on mechanical ventilatory support due to worsening respiratory failure with hypoxia and hypercarbia. PCCM was consulted for admission and further medical management. Productive cough. Afebrile. Bilateral rhonchi on exam. RSV/COVID/Flu NEGATIVE.Marland Kitchen BNP 74.2. CTA showed no definite pulmonary embolism but did show bilateral airspace disease and small pleural effusions. Given IV diuresis and working to wean O2  (only wears O2 at night)   Assessment and Plan: Acute on Chronic Hypoxic and Hypercapnic Respiratory Failure due to Acute on Chronic HFpEF/Tracheostomy dependent - presents to the hospital with shortness of breath, found to have acute on chronic hypoxic and hypercapnic respiratory failure with exam findings consistent with decompensated heart failure. - s/p IV diuresis and mechanical ventilation, with improvement- trach collar -echo done -s/p steroids -Continue nebulizer therapy.  -Treating for CAP  -continue to wean O2 (only wears at night)  OSA  OHS -was to start wearing O2 at night  H/o VTE -on eliquis  Hypoglycemia -resolved  Obesity Estimated body mass index is 62.27 kg/m as calculated from the following:   Height as of this encounter: 5\' 6"  (1.676 m).   Weight as of this encounter: 175 kg.   DVT prophylaxis: SCDs Start: 09/02/23 1754 apixaban (ELIQUIS) tablet 5 mg    Code Status: Full Code   Disposition Plan:  Level of care: Progressive Status is: Inpatient     Consultants:  PCCM   Subjective: Has been getting up to the chair  Objective: Vitals:   09/07/23 0805 09/07/23 0900  09/07/23 1101 09/07/23 1144  BP:  127/82    Pulse:  (!) 54 (!) 53   Resp:  18 18   Temp: 98.1 F (36.7 C)   97.9 F (36.6 C)  TempSrc: Oral   Oral  SpO2:  100%    Weight:      Height:        Intake/Output Summary (Last 24 hours) at 09/07/2023 1255 Last data filed at 09/06/2023 1800 Gross per 24 hour  Intake --  Output 650 ml  Net -650 ml   Filed Weights   09/04/23 0400 09/06/23 0500 09/07/23 0500  Weight: (!) 177.2 kg (!) 174.4 kg (!) 175 kg    Examination:   General: Appearance:    Severely obese female in no acute distress   Trach in place  Lungs:    diminished, respirations unlabored  Heart:    Bradycardic.    MS:   All extremities are intact.    Neurologic:   Awake, alert       Data Reviewed: I have personally reviewed following labs and imaging studies  CBC: Recent Labs  Lab 09/02/23 1249 09/02/23 1302 09/02/23 2111 09/03/23 0317 09/04/23 0816 09/05/23 0838 09/06/23 0226  WBC 8.4  --  7.2 6.5 6.4 5.9 5.8  NEUTROABS 6.9  --  6.0  --   --   --   --   HGB 14.1   < > 13.0 12.4 12.6 13.4 13.5  HCT 48.3*   < > 43.5 42.6 43.0 44.5 45.1  MCV 100.4*  --  98.2 99.1 97.9 96.5 97.0  PLT 220  --  199 194 205 200 210   < > = values in this interval not displayed.   Basic Metabolic Panel: Recent Labs  Lab 09/02/23 1249 09/02/23 1302 09/02/23 1858 09/02/23 2111 09/03/23 0317 09/04/23 0816 09/05/23 0838 09/06/23 0226  NA 142   < > 141  --  141 141 139 139  K 4.7   < > 4.7  --  4.9 4.6 3.6 3.8  CL 101  --   --   --  101 98 89* 91*  CO2 30  --   --   --  31 35* 35* 40*  GLUCOSE 101*  --   --   --  119* 83 105* 97  BUN 11  --   --   --  15 24* 31* 30*  CREATININE 1.15*  --   --   --  1.07* 0.98 1.00 0.90  CALCIUM 9.2  --   --   --  8.8* 8.9 9.1 9.0  MG  --   --   --  2.1 2.3  --   --   --   PHOS  --   --   --  3.9 5.0*  --   --   --    < > = values in this interval not displayed.   GFR: Estimated Creatinine Clearance: 113.6 mL/min (by C-G formula  based on SCr of 0.9 mg/dL). Liver Function Tests: Recent Labs  Lab 09/02/23 1249  AST 18  ALT 7  ALKPHOS 60  BILITOT 1.0  PROT 8.2*  ALBUMIN 3.5   No results for input(s): "LIPASE", "AMYLASE" in the last 168 hours. No results for input(s): "AMMONIA" in the last 168 hours. Coagulation Profile: No results for input(s): "INR", "PROTIME" in the last 168 hours. Cardiac Enzymes: No results for input(s): "CKTOTAL", "CKMB", "CKMBINDEX", "TROPONINI" in the last 168 hours. BNP (last 3 results) No results for input(s): "PROBNP" in the last 8760 hours. HbA1C: No results for input(s): "HGBA1C" in the last 72 hours. CBG: Recent Labs  Lab 09/06/23 1919 09/06/23 2332 09/07/23 0320 09/07/23 0759 09/07/23 1143  GLUCAP 178* 118* 87 93 92   Lipid Profile: No results for input(s): "CHOL", "HDL", "LDLCALC", "TRIG", "CHOLHDL", "LDLDIRECT" in the last 72 hours. Thyroid Function Tests: No results for input(s): "TSH", "T4TOTAL", "FREET4", "T3FREE", "THYROIDAB" in the last 72 hours. Anemia Panel: No results for input(s): "VITAMINB12", "FOLATE", "FERRITIN", "TIBC", "IRON", "RETICCTPCT" in the last 72 hours. Sepsis Labs: Recent Labs  Lab 09/02/23 1302 09/02/23 1551 09/02/23 1905 09/02/23 2111 09/04/23 0351  PROCALCITON  --   --   --  <0.10 <0.10  LATICACIDVEN 1.5 0.6 0.8  --   --     Recent Results (from the past 240 hours)  Resp panel by RT-PCR (RSV, Flu A&B, Covid) Anterior Nasal Swab     Status: None   Collection Time: 09/02/23 12:34 PM   Specimen: Anterior Nasal Swab  Result Value Ref Range Status   SARS Coronavirus 2 by RT PCR NEGATIVE NEGATIVE Final   Influenza A by PCR NEGATIVE NEGATIVE Final   Influenza B by PCR NEGATIVE NEGATIVE Final    Comment: (NOTE) The Xpert Xpress SARS-CoV-2/FLU/RSV plus assay is intended as an aid in the diagnosis of influenza from Nasopharyngeal swab specimens and should not be used as a sole basis for treatment. Nasal washings and aspirates are  unacceptable for Xpert Xpress SARS-CoV-2/FLU/RSV testing.  Fact Sheet for Patients: BloggerCourse.com  Fact Sheet for Healthcare Providers: SeriousBroker.it  This test is not  yet approved or cleared by the [place] and has been authorized for detection and/or diagnosis of SARS-CoV-2 by FDA under an Emergency Use Authorization (EUA). This EUA will remain in effect (meaning this test can be used) for the duration of the COVID-19 declaration under Section 564(b)(1) of the Act, 21 U.S.C. section 360bbb-3(b)(1), unless the authorization is terminated or revoked.     Resp Syncytial Virus by PCR NEGATIVE NEGATIVE Final    Comment: (NOTE) Fact Sheet for Patients: BloggerCourse.com  Fact Sheet for Healthcare Providers: SeriousBroker.it  This test is not yet approved or cleared by the [place] FDA and has been authorized for detection and/or diagnosis of SARS-CoV-2 by FDA under an Emergency Use Authorization (EUA). This EUA will remain in effect (meaning this test can be used) for the duration of the COVID-19 declaration under Section 564(b)(1) of the Act, 21 U.S.C. section 360bbb-3(b)(1), unless the authorization is terminated or revoked.  Performed at [facility], [address] [place], [place] [address]   Culture, blood (routine x 2)     Status: None   Collection Time: 09/02/23 12:49 PM   Specimen: BLOOD RIGHT ARM  Result Value Ref Range Status   Specimen Description BLOOD RIGHT ARM  Final   Special Requests   Final    BOTTLES DRAWN AEROBIC AND ANAEROBIC Blood Culture results may not be optimal due to an inadequate volume of blood received in culture bottles   Culture   Final    NO GROWTH 5 DAYS Performed at [facility], [address], [place], [place] [address]    Report Status 09/07/2023 FINAL  Final  Culture, blood (routine x 2)     Status: None    Collection Time: 09/02/23  1:50 PM   Specimen: BLOOD RIGHT ARM  Result Value Ref Range Status   Specimen Description BLOOD RIGHT ARM  Final   Special Requests   Final    AEROBIC BOTTLE ONLY Blood Culture results may not be optimal due to an inadequate volume of blood received in culture bottles   Culture   Final    NO GROWTH 5 DAYS Performed at [facility], [address], [place], [place] [address]    Report Status 09/07/2023 FINAL  Final  MRSA Next Gen by PCR, Nasal     Status: None   Collection Time: 09/02/23  8:59 PM   Specimen: Nasal Mucosa; Nasal Swab  Result Value Ref Range Status   MRSA by PCR Next Gen NOT DETECTED NOT DETECTED Final    Comment: (NOTE) The GeneXpert MRSA Assay (FDA approved for NASAL specimens only), is one component of a comprehensive MRSA colonization surveillance program. It is not intended to diagnose MRSA infection nor to guide or monitor treatment for MRSA infections. Test performance is not FDA approved in patients less than 65 years old. Performed at [facility], [address], [place], [place] [address]   Respiratory (~20 pathogens) panel by PCR     Status: None   Collection Time: 09/03/23  5:01 PM   Specimen: Nasopharyngeal Swab; Respiratory  Result Value Ref Range Status   Adenovirus NOT DETECTED NOT DETECTED Final   Coronavirus 229E NOT DETECTED NOT DETECTED Final    Comment: (NOTE) The Coronavirus on the Respiratory Panel, DOES NOT test for the novel  Coronavirus (2019 nCoV)    Coronavirus HKU1 NOT DETECTED NOT DETECTED Final   Coronavirus NL63 NOT DETECTED NOT DETECTED Final   Coronavirus OC43  NOT DETECTED NOT DETECTED Final   Metapneumovirus NOT DETECTED NOT DETECTED Final   Rhinovirus / Enterovirus NOT DETECTED NOT DETECTED Final   Influenza A NOT DETECTED NOT DETECTED Final   Influenza B NOT DETECTED NOT DETECTED Final   Parainfluenza Virus 1 NOT DETECTED NOT DETECTED Final   Parainfluenza Virus 2 NOT DETECTED NOT  DETECTED Final   Parainfluenza Virus 3 NOT DETECTED NOT DETECTED Final   Parainfluenza Virus 4 NOT DETECTED NOT DETECTED Final   Respiratory Syncytial Virus NOT DETECTED NOT DETECTED Final   Bordetella pertussis NOT DETECTED NOT DETECTED Final   Bordetella Parapertussis NOT DETECTED NOT DETECTED Final   Chlamydophila pneumoniae NOT DETECTED NOT DETECTED Final   Mycoplasma pneumoniae NOT DETECTED NOT DETECTED Final    Comment: Performed at [facility], [address] [place], [place] [address]         Radiology Studies: No results found.      Scheduled Meds:  apixaban  5 mg Oral BID   arformoterol  15 mcg Nebulization BID   atorvastatin  20 mg Oral Daily   budesonide (PULMICORT) nebulizer solution  0.5 mg Nebulization BID   Chlorhexidine Gluconate Cloth  6 each Topical Daily   docusate sodium  100 mg Oral BID   insulin aspart  1-3 Units Subcutaneous Q4H   mouth rinse  15 mL Mouth Rinse 4 times per day   polyethylene glycol  17 g Oral Daily   revefenacin  175 mcg Nebulization Daily   spironolactone  25 mg Oral Daily   Continuous Infusions:     LOS: 5 days    Time spent: 45 minutes spent on chart review, discussion with nursing staff, consultants, updating family and interview/physical exam; more than 50% of that time was spent in counseling and/or coordination of care.    Joseph Art, DO [facility] Available via Epic secure chat 7am-7pm After these hours, please refer to coverage provider listed on [email or web address] 09/07/2023, 12:55 PM

## 2023-09-08 DIAGNOSIS — R0689 Other abnormalities of breathing: Secondary | ICD-10-CM | POA: Diagnosis not present

## 2023-09-08 LAB — CBC
HCT: 44 % (ref 36.0–46.0)
Hemoglobin: 13.2 g/dL (ref 12.0–15.0)
MCH: 29.2 pg (ref 26.0–34.0)
MCHC: 30 g/dL (ref 30.0–36.0)
MCV: 97.3 fL (ref 80.0–100.0)
Platelets: 169 10*3/uL (ref 150–400)
RBC: 4.52 MIL/uL (ref 3.87–5.11)
RDW: 14.2 % (ref 11.5–15.5)
WBC: 5.9 10*3/uL (ref 4.0–10.5)
nRBC: 0 % (ref 0.0–0.2)

## 2023-09-08 LAB — GLUCOSE, CAPILLARY
Glucose-Capillary: 79 mg/dL (ref 70–99)
Glucose-Capillary: 70 mg/dL (ref 70–99)
Glucose-Capillary: 72 mg/dL (ref 70–99)
Glucose-Capillary: 90 mg/dL (ref 70–99)
Glucose-Capillary: 117 mg/dL — ABNORMAL HIGH (ref 70–99)

## 2023-09-08 LAB — BASIC METABOLIC PANEL
Anion gap: 3 — ABNORMAL LOW (ref 5–15)
BUN: 23 mg/dL — ABNORMAL HIGH (ref 6–20)
CO2: 40 mmol/L — ABNORMAL HIGH (ref 22–32)
Calcium: 8.7 mg/dL — ABNORMAL LOW (ref 8.9–10.3)
Chloride: 95 mmol/L — ABNORMAL LOW (ref 98–111)
Creatinine, Ser: 0.82 mg/dL (ref 0.44–1.00)
GFR, Estimated: >60 mL/min (ref >60)
Glucose, Bld: 89 mg/dL (ref 70–99)
Potassium: 4.3 mmol/L (ref 3.5–5.1)
Sodium: 138 mmol/L (ref 135–145)

## 2023-09-08 MED ORDER — INSULIN ASPART 100 UNIT/ML IJ SOLN: 0.0000 [IU] | Freq: Every day | INTRAMUSCULAR | Status: DC

## 2023-09-08 MED ORDER — INSULIN ASPART 100 UNIT/ML IJ SOLN: 0.0000 [IU] | Freq: Three times a day (TID) | INTRAMUSCULAR | Status: DC

## 2023-09-08 NOTE — Evaluation (Signed)
 Physical Therapy Evaluation Patient Details Name: Angel Brewer MRN: [id number] DOB: [birth year] Today's Date: 09/08/2023  History of Present Illness  58 yo female admitted 2/19 with SOB, acute on chronic hypoxic and hypercapnic respiratory failure and decompensated heart failure. Vent support 2/19-2/20. PMHx: HTN, DVT, PE, morbid obesity, wrist ligament reconstruction, trach dependent with nocturnal O2, sleep apnea  Clinical Impression  Pt very pleasant and reports living alone, working part-time at [employer] and only using nocturnal O2 at 3L. Pt required 40% FiO2 for gait to maintain SPO2 >90% but was able to walk without AD or assist with decreased speed. Pt with decreased activity tolerance and pulmonary function from baseline who will benefit from acute therapy to maximize mobility, safety and independence to return home. Encouraged OOB, up to toilet and walking with staff. Will follow acutely.         If plan is discharge home, recommend the following: A Rattan help with walking and/or transfers;A Afshar help with bathing/dressing/bathroom   Can travel by private vehicle        Equipment Recommendations None recommended by PT  Recommendations for Other Services       Functional Status Assessment Patient has had a recent decline in their functional status and demonstrates the ability to make significant improvements in function in a reasonable and predictable amount of time.     Precautions / Restrictions Precautions Precautions: Other (comment) Precaution/Restrictions Comments: trach      Mobility  Bed Mobility Overal bed mobility: Needs Assistance Bed Mobility: Supine to Sit     Supine to sit: Min assist, HOB elevated, Used rails     General bed mobility comments: Min assist to fully elevate trunk to sitting, HOB 35 degrees, pt reports elevated HOB at home    Transfers Overall transfer level: Modified independent                 General transfer comment: pt  able to rise from bed elevated to home height    Ambulation/Gait Ambulation/Gait assistance: Supervision Gait Distance (Feet): 80 Feet Assistive device: None Gait Pattern/deviations: Step-through pattern, Decreased stride length, Wide base of support   Gait velocity interpretation: <1.8 ft/sec, indicate of risk for recurrent falls   General Gait Details: pt with slow steady gait with SPO2 >92% on 8L at 40% after pulse ox changed  Stairs            Wheelchair Mobility     Tilt Bed    Modified Rankin (Stroke Patients Only)       Balance Overall balance assessment: Mild deficits observed, not formally tested                                           Pertinent Vitals/Pain Pain Assessment Pain Assessment: No/denies pain    Home Living Family/patient expects to be discharged to:: Private residence Living Arrangements: Alone Available Help at Discharge: Family;Available PRN/intermittently Type of Home: House Home Access: Stairs to enter   Entrance Stairs-Number of Steps: 1   Home Layout: One level Home Equipment: Agricultural consultant (2 wheels);BSC/3in1;Shower seat      Prior Function Prior Level of Function : Independent/Modified Independent;Driving;Working/employed             Mobility Comments: drives, works part time at [employer]   Upper Extremity Assessment Upper Extremity Assessment: Generalized weakness  Lower Extremity Assessment Lower Extremity Assessment: Generalized weakness (increased body habitus, limiting ROM)    Cervical / Trunk Assessment Cervical / Trunk Assessment: Other exceptions Cervical / Trunk Exceptions: increased body habitus with large sacral shelf  Communication   Communication Factors Affecting Communication: [name] valve    Cognition Arousal: Alert Behavior During Therapy: WFL for tasks assessed/performed   PT - Cognitive impairments: No apparent  impairments                         Following commands: Intact       Cueing Cueing Techniques: Verbal cues     General Comments      Exercises     Assessment/Plan    PT Assessment Patient needs continued PT services  PT Problem List Decreased activity tolerance;Decreased mobility;Cardiopulmonary status limiting activity       PT Treatment Interventions Gait training;Functional mobility training;Therapeutic activities;Patient/family education    PT Goals (Current goals can be found in the Care Plan section)  Acute Rehab PT Goals Patient Stated Goal: return home and to work PT Goal Formulation: With patient Time For Goal Achievement: 09/22/23 Potential to Achieve Goals: Good    Frequency Min 1X/week     Co-evaluation               AM-PAC PT "6 Clicks" Mobility  Outcome Measure Help needed turning from your back to your side while in a flat bed without using bedrails?: A Puzzo Help needed moving from lying on your back to sitting on the side of a flat bed without using bedrails?: A Hepp Help needed moving to and from a bed to a chair (including a wheelchair)?: A Caggiano Help needed standing up from a chair using your arms (e.g., wheelchair or bedside chair)?: A Devargas Help needed to walk in hospital room?: A Kronberg Help needed climbing 3-5 steps with a railing? : A Candelas 6 Click Score: 18    End of Session Equipment Utilized During Treatment: Oxygen Activity Tolerance: Patient tolerated treatment well Patient left: in chair;with call bell/phone within reach Nurse Communication: Mobility status PT Visit Diagnosis: Other abnormalities of gait and mobility (R26.89)    Time: 2956-2130 PT Time Calculation (min) (ACUTE ONLY): 20 min   Charges:   PT Evaluation $PT Eval Moderate Complexity: 1 Mod   PT General Charges $$ ACUTE PT VISIT: 1 Visit         [name], PT Acute Rehabilitation Services Office: [phone]   [name] B  [name] 09/08/2023, 9:00 AM

## 2023-09-08 NOTE — TOC Progression Note (Addendum)
 Transition of Care [facility]) - Progression Note    Patient Details  Name: Angel Brewer MRN: [id number] Date of Birth: December 20, 1965  [facility] CM/SW Contact  Tom-Johnson, Hershal Coria, RN Phone Number: 09/08/2023, 4:52 PM  Clinical Narrative:     Patient progressively improved to [facility] with nocturnal O2.    No PT f/u noted.   Patient not Medically ready for discharge.  CM will continue to follow as patient progresses with care towards discharge.             Expected Discharge Plan and Services                                               Social Determinants of Health (SDOH) Interventions SDOH Screenings   Food Insecurity: Patient Declined (09/03/2023)  Recent Concern: Food Insecurity - Food Insecurity Present (06/16/2023)  Housing: Patient Declined (09/03/2023)  Transportation Needs: Patient Declined (09/03/2023)  Utilities: Patient Declined (09/03/2023)  Depression (PHQ2-9): Low Risk  (06/17/2023)  Financial Resource Strain: Medium Risk (06/16/2023)  Physical Activity: Sufficiently Active (06/16/2023)  Social Connections: Socially Isolated (06/16/2023)  Stress: No Stress Concern Present (06/16/2023)  Tobacco Use: Medium Risk (09/02/2023)  Health Literacy: Adequate Health Literacy (06/17/2023)    Readmission Risk Interventions    09/03/2023    1:17 PM 12/20/2021   11:33 AM  Readmission Risk Prevention Plan  Post Dischage Appt Complete   Medication Screening Complete   Transportation Screening Complete Complete  PCP or Specialist Appt within 3-5 Days  Complete  HRI or Home Care Consult  Complete  Social Work Consult for Recovery Care Planning/Counseling  Complete  Palliative Care Screening  Not Applicable  Medication Review Oceanographer)  Complete

## 2023-09-08 NOTE — Progress Notes (Signed)
 PROGRESS NOTE    Angel Brewer  VQQ:[id number] DOB: 03/16/1966 DOA: 09/02/2023 PCP: Claiborne Rigg, NP    Brief Narrative:  This morbidly obese 58 year old female was admitted on 09/02/2023 with shortness of breath, which began last night with the inability to fall asleep. Upon arrival to ED, patient was noted to be hypoxic and cyanotic. She has a tracheostomy and was placed on mechanical ventilatory support due to worsening respiratory failure with hypoxia and hypercarbia. PCCM was consulted for admission and further medical management. Productive cough. Afebrile. Bilateral rhonchi on exam. RSV/COVID/Flu NEGATIVE.Marland Kitchen BNP 74.2. CTA showed no definite pulmonary embolism but did show bilateral airspace disease and small pleural effusions. Given IV diuresis and working to wean O2  (only wears O2 at night)   Assessment and Plan: Acute on Chronic Hypoxic and Hypercapnic Respiratory Failure due to Acute on Chronic HFpEF/Tracheostomy dependent - presents to the hospital with shortness of breath, found to have acute on chronic hypoxic and hypercapnic respiratory failure with exam findings consistent with decompensated heart failure. - s/p IV diuresis and mechanical ventilation, with improvement- trach collar -echo done -s/p steroids -Continue nebulizer therapy.  -Treated for CAP  -continue to wean O2 (only wears at night)  OSA  OHS -was to start wearing O2 at night  H/o VTE -on eliquis  Hypoglycemia -resolved  Obesity Estimated body mass index is 65.09 kg/m as calculated from the following:   Height as of this encounter: 5\' 6"  (1.676 m).   Weight as of this encounter: 182.9 kg.   OOB/ambulate    DVT prophylaxis: SCDs Start: 09/02/23 1754 apixaban (ELIQUIS) tablet 5 mg    Code Status: Full Code   Disposition Plan:  Level of care: Progressive Status is: Inpatient     Consultants:  PCCM   Subjective: Feels well, no SOB  Objective: Vitals:   09/08/23 1055 09/08/23  1100 09/08/23 1120 09/08/23 1210  BP:    (!) 107/95  Pulse: (!) 57 (!) 55  (!) 59  Resp: 20 18  (!) 23  Temp:   98 F (36.7 C)   TempSrc:   Oral   SpO2: 96% 98%  97%  Weight:      Height:        Intake/Output Summary (Last 24 hours) at 09/08/2023 1354 Last data filed at 09/08/2023 1200 Gross per 24 hour  Intake 390 ml  Output 700 ml  Net -310 ml   Filed Weights   09/06/23 0500 09/07/23 0500 09/08/23 0500  Weight: (!) 174.4 kg (!) 175 kg (!) 182.9 kg    Examination:   General: Appearance:    Severely obese female in no acute distress   Trach in place  Lungs:    diminished, respirations unlabored  Heart:    Bradycardic.    MS:   All extremities are intact.    Neurologic:   Awake, alert       Data Reviewed: I have personally reviewed following labs and imaging studies  CBC: Recent Labs  Lab 09/02/23 1249 09/02/23 1302 09/02/23 2111 09/03/23 0317 09/04/23 0816 09/05/23 0838 09/06/23 0226 09/08/23 0127  WBC 8.4  --  7.2 6.5 6.4 5.9 5.8 5.9  NEUTROABS 6.9  --  6.0  --   --   --   --   --   HGB 14.1   < > 13.0 12.4 12.6 13.4 13.5 13.2  HCT 48.3*   < > 43.5 42.6 43.0 44.5 45.1 44.0  MCV 100.4*  --  98.2  99.1 97.9 96.5 97.0 97.3  PLT 220  --  199 194 205 200 210 169   < > = values in this interval not displayed.   Basic Metabolic Panel: Recent Labs  Lab 09/02/23 2111 09/03/23 0317 09/04/23 0816 09/05/23 0838 09/06/23 0226 09/08/23 0127  NA  --  141 141 139 139 138  K  --  4.9 4.6 3.6 3.8 4.3  CL  --  101 98 89* 91* 95*  CO2  --  31 35* 35* 40* 40*  GLUCOSE  --  119* 83 105* 97 89  BUN  --  15 24* 31* 30* 23*  CREATININE  --  1.07* 0.98 1.00 0.90 0.82  CALCIUM  --  8.8* 8.9 9.1 9.0 8.7*  MG 2.1 2.3  --   --   --   --   PHOS 3.9 5.0*  --   --   --   --    GFR: Estimated Creatinine Clearance: 128.3 mL/min (by C-G formula based on SCr of 0.82 mg/dL). Liver Function Tests: Recent Labs  Lab 09/02/23 1249  AST 18  ALT 7  ALKPHOS 60  BILITOT 1.0   PROT 8.2*  ALBUMIN 3.5   No results for input(s): "LIPASE", "AMYLASE" in the last 168 hours. No results for input(s): "AMMONIA" in the last 168 hours. Coagulation Profile: No results for input(s): "INR", "PROTIME" in the last 168 hours. Cardiac Enzymes: No results for input(s): "CKTOTAL", "CKMB", "CKMBINDEX", "TROPONINI" in the last 168 hours. BNP (last 3 results) No results for input(s): "PROBNP" in the last 8760 hours. HbA1C: No results for input(s): "HGBA1C" in the last 72 hours. CBG: Recent Labs  Lab 09/07/23 1920 09/07/23 2308 09/08/23 0309 09/08/23 0722 09/08/23 1137  GLUCAP 131* 88 79 70 72   Lipid Profile: No results for input(s): "CHOL", "HDL", "LDLCALC", "TRIG", "CHOLHDL", "LDLDIRECT" in the last 72 hours. Thyroid Function Tests: No results for input(s): "TSH", "T4TOTAL", "FREET4", "T3FREE", "THYROIDAB" in the last 72 hours. Anemia Panel: No results for input(s): "VITAMINB12", "FOLATE", "FERRITIN", "TIBC", "IRON", "RETICCTPCT" in the last 72 hours. Sepsis Labs: Recent Labs  Lab 09/02/23 1302 09/02/23 1551 09/02/23 1905 09/02/23 2111 09/04/23 0351  PROCALCITON  --   --   --  <0.10 <0.10  LATICACIDVEN 1.5 0.6 0.8  --   --     Recent Results (from the past 240 hours)  Resp panel by RT-PCR (RSV, Flu A&B, Covid) Anterior Nasal Swab     Status: None   Collection Time: 09/02/23 12:34 PM   Specimen: Anterior Nasal Swab  Result Value Ref Range Status   SARS Coronavirus 2 by RT PCR NEGATIVE NEGATIVE Final   Influenza A by PCR NEGATIVE NEGATIVE Final   Influenza B by PCR NEGATIVE NEGATIVE Final    Comment: (NOTE) The Xpert Xpress SARS-CoV-2/FLU/RSV plus assay is intended as an aid in the diagnosis of influenza from Nasopharyngeal swab specimens and should not be used as a sole basis for treatment. Nasal washings and aspirates are unacceptable for Xpert Xpress SARS-CoV-2/FLU/RSV testing.  Fact Sheet for  Patients: BloggerCourse.com  Fact Sheet for Healthcare Providers: SeriousBroker.it  This test is not yet approved or cleared by the [place] FDA and has been authorized for detection and/or diagnosis of SARS-CoV-2 by FDA under an Emergency Use Authorization (EUA). This EUA will remain in effect (meaning this test can be used) for the duration of the COVID-19 declaration under Section 564(b)(1) of the Act, 21 U.S.C. section 360bbb-3(b)(1), unless the authorization is terminated  or revoked.     Resp Syncytial Virus by PCR NEGATIVE NEGATIVE Final    Comment: (NOTE) Fact Sheet for Patients: BloggerCourse.com  Fact Sheet for Healthcare Providers: SeriousBroker.it  This test is not yet approved or cleared by the [place] FDA and has been authorized for detection and/or diagnosis of SARS-CoV-2 by FDA under an Emergency Use Authorization (EUA). This EUA will remain in effect (meaning this test can be used) for the duration of the COVID-19 declaration under Section 564(b)(1) of the Act, 21 U.S.C. section 360bbb-3(b)(1), unless the authorization is terminated or revoked.  Performed at [facility], [address] [place], [place] [address]   Culture, blood (routine x 2)     Status: None   Collection Time: 09/02/23 12:49 PM   Specimen: BLOOD RIGHT ARM  Result Value Ref Range Status   Specimen Description BLOOD RIGHT ARM  Final   Special Requests   Final    BOTTLES DRAWN AEROBIC AND ANAEROBIC Blood Culture results may not be optimal due to an inadequate volume of blood received in culture bottles   Culture   Final    NO GROWTH 5 DAYS Performed at [facility], [address], [place], [place] [address]    Report Status 09/07/2023 FINAL  Final  Culture, blood (routine x 2)     Status: None   Collection Time: 09/02/23  1:50 PM   Specimen: BLOOD RIGHT ARM  Result  Value Ref Range Status   Specimen Description BLOOD RIGHT ARM  Final   Special Requests   Final    AEROBIC BOTTLE ONLY Blood Culture results may not be optimal due to an inadequate volume of blood received in culture bottles   Culture   Final    NO GROWTH 5 DAYS Performed at [facility], [address], [place], [place] [address]    Report Status 09/07/2023 FINAL  Final  MRSA Next Gen by PCR, Nasal     Status: None   Collection Time: 09/02/23  8:59 PM   Specimen: Nasal Mucosa; Nasal Swab  Result Value Ref Range Status   MRSA by PCR Next Gen NOT DETECTED NOT DETECTED Final    Comment: (NOTE) The GeneXpert MRSA Assay (FDA approved for NASAL specimens only), is one component of a comprehensive MRSA colonization surveillance program. It is not intended to diagnose MRSA infection nor to guide or monitor treatment for MRSA infections. Test performance is not FDA approved in patients less than 64 years old. Performed at [facility], [address], [place], [place] [address]   Respiratory (~20 pathogens) panel by PCR     Status: None   Collection Time: 09/03/23  5:01 PM   Specimen: Nasopharyngeal Swab; Respiratory  Result Value Ref Range Status   Adenovirus NOT DETECTED NOT DETECTED Final   Coronavirus 229E NOT DETECTED NOT DETECTED Final    Comment: (NOTE) The Coronavirus on the Respiratory Panel, DOES NOT test for the novel  Coronavirus (2019 nCoV)    Coronavirus HKU1 NOT DETECTED NOT DETECTED Final   Coronavirus NL63 NOT DETECTED NOT DETECTED Final   Coronavirus OC43 NOT DETECTED NOT DETECTED Final   Metapneumovirus NOT DETECTED NOT DETECTED Final   Rhinovirus / Enterovirus NOT DETECTED NOT DETECTED Final   Influenza A NOT DETECTED NOT DETECTED Final   Influenza B NOT DETECTED NOT DETECTED Final   Parainfluenza Virus 1 NOT DETECTED NOT DETECTED Final   Parainfluenza Virus 2 NOT DETECTED NOT DETECTED Final  Parainfluenza Virus 3 NOT DETECTED NOT DETECTED Final    Parainfluenza Virus 4 NOT DETECTED NOT DETECTED Final   Respiratory Syncytial Virus NOT DETECTED NOT DETECTED Final   Bordetella pertussis NOT DETECTED NOT DETECTED Final   Bordetella Parapertussis NOT DETECTED NOT DETECTED Final   Chlamydophila pneumoniae NOT DETECTED NOT DETECTED Final   Mycoplasma pneumoniae NOT DETECTED NOT DETECTED Final    Comment: Performed at [facility], [address], [place], [place] [address]         Radiology Studies: No results found.      Scheduled Meds:  apixaban  5 mg Oral BID   arformoterol  15 mcg Nebulization BID   atorvastatin  20 mg Oral Daily   budesonide (PULMICORT) nebulizer solution  0.5 mg Nebulization BID   Chlorhexidine Gluconate Cloth  6 each Topical Daily   docusate sodium  100 mg Oral BID   insulin aspart  0-5 Units Subcutaneous QHS   insulin aspart  0-9 Units Subcutaneous TID WC   mouth rinse  15 mL Mouth Rinse 4 times per day   polyethylene glycol  17 g Oral Daily   revefenacin  175 mcg Nebulization Daily   spironolactone  25 mg Oral Daily   Continuous Infusions:     LOS: 6 days    Time spent: 45 minutes spent on chart review, discussion with nursing staff, consultants, updating family and interview/physical exam; more than 50% of that time was spent in counseling and/or coordination of care.    Joseph Art, DO [facility] Available via Epic secure chat 7am-7pm After these hours, please refer to coverage provider listed on [email or web address] 09/08/2023, 1:54 PM

## 2023-09-09 DIAGNOSIS — J9602 Acute respiratory failure with hypercapnia: Secondary | ICD-10-CM | POA: Diagnosis not present

## 2023-09-09 DIAGNOSIS — J9601 Acute respiratory failure with hypoxia: Secondary | ICD-10-CM | POA: Diagnosis not present

## 2023-09-09 LAB — CBC
HCT: 44.2 % (ref 36.0–46.0)
Hemoglobin: 13.3 g/dL (ref 12.0–15.0)
MCH: 29.2 pg (ref 26.0–34.0)
MCHC: 30.1 g/dL (ref 30.0–36.0)
MCV: 97.1 fL (ref 80.0–100.0)
Platelets: 175 10*3/uL (ref 150–400)
RBC: 4.55 MIL/uL (ref 3.87–5.11)
RDW: 14.3 % (ref 11.5–15.5)
WBC: 7 10*3/uL (ref 4.0–10.5)
nRBC: 0 % (ref 0.0–0.2)

## 2023-09-09 LAB — BASIC METABOLIC PANEL
Anion gap: 8 (ref 5–15)
BUN: 21 mg/dL — ABNORMAL HIGH (ref 6–20)
CO2: 36 mmol/L — ABNORMAL HIGH (ref 22–32)
Calcium: 9.1 mg/dL (ref 8.9–10.3)
Chloride: 94 mmol/L — ABNORMAL LOW (ref 98–111)
Creatinine, Ser: 0.83 mg/dL (ref 0.44–1.00)
GFR, Estimated: >60 mL/min (ref >60)
Glucose, Bld: 90 mg/dL (ref 70–99)
Potassium: 4.4 mmol/L (ref 3.5–5.1)
Sodium: 138 mmol/L (ref 135–145)

## 2023-09-09 LAB — GLUCOSE, CAPILLARY
Glucose-Capillary: 73 mg/dL (ref 70–99)
Glucose-Capillary: 85 mg/dL (ref 70–99)
Glucose-Capillary: 104 mg/dL — ABNORMAL HIGH (ref 70–99)
Glucose-Capillary: 115 mg/dL — ABNORMAL HIGH (ref 70–99)

## 2023-09-09 MED ORDER — DAPAGLIFLOZIN PROPANEDIOL 10 MG PO TABS
10.0000 mg | ORAL_TABLET | Freq: Every day | ORAL | Status: DC
  Administered 2023-09-09 – 2023-09-11 (×3): 10 mg via ORAL
  Filled 2023-09-09 (×3): qty 1

## 2023-09-09 MED ORDER — FUROSEMIDE 40 MG PO TABS
40.0000 mg | ORAL_TABLET | Freq: Every day | ORAL | Status: DC
  Administered 2023-09-09 – 2023-09-11 (×3): 40 mg via ORAL
  Filled 2023-09-09 (×3): qty 1

## 2023-09-09 NOTE — Progress Notes (Signed)
 [facility] PROGRESS NOTE  Angel Brewer (DOB: [birth year]) ZOX:[id number] PCP: Claiborne Rigg, NP  Brief Narrative: This morbidly obese 58 year old female was admitted on 09/02/2023 with shortness of breath, which began last night with the inability to fall asleep. Upon arrival to ED, patient was noted to be hypoxic and cyanotic. She has a tracheostomy and was placed on mechanical ventilatory support due to worsening respiratory failure with hypoxia and hypercarbia. PCCM was consulted for admission and further medical management. Productive cough. Afebrile. Bilateral rhonchi on exam. RSV/COVID/Flu NEGATIVE.Marland Kitchen BNP 74.2. CTA showed no definite pulmonary embolism but did show bilateral airspace disease and small pleural effusions. Given IV diuresis and working to wean O2  (only wears O2 at night)   Subjective: Sitting in chair with no complaints, hasn't been getting up much so feels deconditioned, eager to progress.   Objective: BP 113/77 (BP Location: Left Arm)   Pulse 65   Temp 98.2 F (36.8 C) (Oral)   Resp 19   Ht 5\' 6"  (1.676 m)   Wt (!) 186 kg   LMP 11/29/2015   SpO2 94%   BMI 66.18 kg/m   Gen: No distress Neck: Trach collar w/PMV in place, no significant secretions.  Pulm: Clear, distant, nonlabored without crackles or wheezes.   CV: RRR, no MRG, +LE edema bilaterally  GI: Soft, NT, ND, +BS  Neuro: Alert and oriented. No new focal deficits. Ext: Warm, no deformities. Skin: No other rashes, lesions or ulcers on visualized skin   Assessment & Plan: Acute on Chronic Hypoxic and Hypercapnic Respiratory Failure due to Acute on Chronic HFpEF/Tracheostomy dependent: s/p trach May 2023. - Echo showed LVEF 55-60%. Will restart home lasix at 40mg  dose, continue spironolactone 25mg  daily, restart home farxiga. - Anticipate transition to cuffless shortly, will touch base with PCCM.  - No wheezing to suggest needs further steroids, has completed antibiotics with ceftriaxone,  azithromycin 2/19 - 2/23). WBC normal.  - Urged to continue pulmonary hygiene/mobilizing as tolerated.    OSA  OHS:  -was to start wearing O2 at night   History of VTE:  - Continue eliquis   Hypoglycemia: Resolved.   Class III obesity: Body mass index is 66.18 kg/m.   Tyrone Nine, MD [facility] [email or web address] 09/09/2023, 6:28 PM

## 2023-09-09 NOTE — Plan of Care (Signed)

## 2023-09-09 NOTE — TOC Progression Note (Addendum)
 Transition of Care [facility]) - Progression Note    Patient Details  Name: Angel Brewer MRN: [id number] Date of Birth: 30-Nov-1965  Transition of Care [facility]) CM/SW Contact  Graves-Bigelow, Lamar Laundry, RN Phone Number: 09/09/2023, 3:41 PM  Clinical Narrative: Patient transferred from  45M. Patient presented for shortness of breath. PTA patient was from home alone. Patient has support of sister. Patient states she has DME shower chair, bedside commode, and oxygen. Case Manager is calling [employer] to see if the patient is active. Patient states she drives to PCP appointments. Sister will transport home once stable. No further needs identified.   1545 2-26 Patient is active with [employer] for oxygen continuous @ 3 Liters.      Expected Discharge Plan: Home/Self Care Barriers to Discharge: No Barriers Identified  Expected Discharge Plan and Services In-house Referral: NA Discharge Planning Services: CM Consult Post Acute Care Choice: NA Living arrangements for the past 2 months: Single Family Home  Social Determinants of Health (SDOH) Interventions SDOH Screenings   Food Insecurity: Patient Declined (09/03/2023)  Recent Concern: Food Insecurity - Food Insecurity Present (06/16/2023)  Housing: Patient Declined (09/03/2023)  Transportation Needs: Patient Declined (09/03/2023)  Utilities: Patient Declined (09/03/2023)  Depression (PHQ2-9): Low Risk  (06/17/2023)  Financial Resource Strain: Medium Risk (06/16/2023)  Physical Activity: Sufficiently Active (06/16/2023)  Social Connections: Socially Isolated (06/16/2023)  Stress: No Stress Concern Present (06/16/2023)  Tobacco Use: Medium Risk (09/02/2023)  Health Literacy: Adequate Health Literacy (06/17/2023)   Readmission Risk Interventions    09/03/2023    1:17 PM 12/20/2021   11:33 AM  Readmission Risk Prevention Plan  Post Dischage Appt Complete   Medication Screening Complete   Transportation Screening Complete Complete  PCP or Specialist Appt  within 3-5 Days  Complete  HRI or Home Care Consult  Complete  Social Work Consult for Recovery Care Planning/Counseling  Complete  Palliative Care Screening  Not Applicable  Medication Review Ocean[employer])  Complete

## 2023-09-09 NOTE — Progress Notes (Signed)
 SLP Cancellation Note  Patient Details Name: Angel Brewer MRN: [id number] DOB: 1966-01-27   Cancelled treatment:       Reason Eval/Treat Not Completed: Other (comment) Patient sitting in recliner with PMV on. She reports that her trach has not been changed to cuffless yet. She has no complaints, recalls recommendation to take PMV off at night and plans to do so. SLP to s/o at this time.   Angela Nevin, MA, CCC-SLP Speech Therapy

## 2023-09-09 NOTE — Plan of Care (Signed)

## 2023-09-09 NOTE — Progress Notes (Signed)
 Mobility Specialist Progress Note;   09/09/23 1005  Mobility  Activity Transferred from bed to chair  Level of Assistance Contact guard assist, steadying assist  Assistive Device Other (Comment) (HHA)  Distance Ambulated (ft) 5 ft  Activity Response Tolerated well  Mobility Referral Yes  Mobility visit 1 Mobility  Mobility Specialist Start Time (ACUTE ONLY) 1005  Mobility Specialist Stop Time (ACUTE ONLY) 1017  Mobility Specialist Time Calculation (min) (ACUTE ONLY) 12 min   Pt requesting to sit in chair, NT requesting assistance. Required MinG assistance via HHA to safely transfer to chair. VSS on 8LO2 and no c/o during session. Pt left in chair with all needs met.   Caesar Bookman Mobility Specialist Please contact via SecureChat or [employer] [phone]

## 2023-09-10 DIAGNOSIS — Z930 Tracheostomy status: Secondary | ICD-10-CM | POA: Diagnosis not present

## 2023-09-10 DIAGNOSIS — J9621 Acute and chronic respiratory failure with hypoxia: Secondary | ICD-10-CM | POA: Diagnosis not present

## 2023-09-10 DIAGNOSIS — G4733 Obstructive sleep apnea (adult) (pediatric): Secondary | ICD-10-CM | POA: Diagnosis not present

## 2023-09-10 DIAGNOSIS — J9602 Acute respiratory failure with hypercapnia: Secondary | ICD-10-CM | POA: Diagnosis not present

## 2023-09-10 DIAGNOSIS — J9622 Acute and chronic respiratory failure with hypercapnia: Secondary | ICD-10-CM | POA: Diagnosis not present

## 2023-09-10 DIAGNOSIS — J9601 Acute respiratory failure with hypoxia: Secondary | ICD-10-CM | POA: Diagnosis not present

## 2023-09-10 LAB — GLUCOSE, CAPILLARY
Glucose-Capillary: 72 mg/dL (ref 70–99)
Glucose-Capillary: 70 mg/dL (ref 70–99)
Glucose-Capillary: 61 mg/dL — ABNORMAL LOW (ref 70–99)
Glucose-Capillary: 91 mg/dL (ref 70–99)
Glucose-Capillary: 130 mg/dL — ABNORMAL HIGH (ref 70–99)

## 2023-09-10 NOTE — Plan of Care (Signed)
   Problem: Education: Goal: Knowledge of General Education information will improve Description Including pain rating scale, medication(s)/side effects and non-pharmacologic comfort measures Outcome: Progressing

## 2023-09-10 NOTE — Plan of Care (Signed)
  Problem: Education: Goal: Knowledge of General Education information will improve Description: Including pain rating scale, medication(s)/side effects and non-pharmacologic comfort measures Outcome: Progressing   Problem: Health Behavior/Discharge Planning: Goal: Ability to manage health-related needs will improve Outcome: Progressing   Problem: Clinical Measurements: Goal: Ability to maintain clinical measurements within normal limits will improve Outcome: Progressing Goal: Will remain free from infection Outcome: Progressing Goal: Diagnostic test results will improve Outcome: Progressing Goal: Respiratory complications will improve Outcome: Progressing Goal: Cardiovascular complication will be avoided Outcome: Progressing   Problem: Activity: Goal: Risk for activity intolerance will decrease Outcome: Progressing   Problem: Nutrition: Goal: Adequate nutrition will be maintained Outcome: Progressing   Problem: Elimination: Goal: Will not experience complications related to bowel motility Outcome: Progressing Goal: Will not experience complications related to urinary retention Outcome: Progressing   Problem: Safety: Goal: Ability to remain free from injury will improve Outcome: Progressing   Problem: Activity: Goal: Ability to tolerate increased activity will improve Outcome: Progressing   Problem: Respiratory: Goal: Patent airway maintenance will improve Outcome: Progressing   Problem: Education: Goal: Ability to describe self-care measures that may prevent or decrease complications (Diabetes Survival Skills Education) will improve Outcome: Progressing Goal: Individualized Educational Video(s) Outcome: Progressing   Problem: Coping: Goal: Ability to adjust to condition or change in health will improve Outcome: Progressing   Problem: Fluid Volume: Goal: Ability to maintain a balanced intake and output will improve Outcome: Progressing   Problem: Health  Behavior/Discharge Planning: Goal: Ability to identify and utilize available resources and services will improve Outcome: Progressing Goal: Ability to manage health-related needs will improve Outcome: Progressing   Problem: Metabolic: Goal: Ability to maintain appropriate glucose levels will improve Outcome: Progressing   Problem: Nutritional: Goal: Maintenance of adequate nutrition will improve Outcome: Progressing Goal: Progress toward achieving an optimal weight will improve Outcome: Progressing   Problem: Skin Integrity: Goal: Risk for impaired skin integrity will decrease Outcome: Progressing   Problem: Tissue Perfusion: Goal: Adequacy of tissue perfusion will improve Outcome: Progressing

## 2023-09-10 NOTE — Progress Notes (Signed)
 Hypoglycemic Event  CBG: 61 AT 1643  Treatment: 4 oz juice/soda  Symptoms: None  Follow-up CBG: Time:1717 CBG Result:91  Possible Reasons for Event: Inadequate meal intake  Comments/MD notified:  DR Jarvis Newcomer    Alpha Gula, RN

## 2023-09-10 NOTE — Progress Notes (Addendum)
 Trach exchanged from #6 Shiley cuffed to cuffless trach.  Pt tolerated well. Positive etco2 color change and bilateral breath soundsn noted.  Airway is patent and secure.

## 2023-09-10 NOTE — Progress Notes (Signed)
 [facility] PROGRESS NOTE  Angel Brewer (DOB: Apr 11, 1966) ZYS:[id number] PCP: Claiborne Rigg, NP  Brief Narrative: This morbidly obese 58 year old female was admitted on 09/02/2023 with shortness of breath, which began last night with the inability to fall asleep. Upon arrival to ED, patient was noted to be hypoxic and cyanotic. She has a tracheostomy and was placed on mechanical ventilatory support due to worsening respiratory failure with hypoxia and hypercarbia. PCCM was consulted for admission and further medical management. Productive cough. Afebrile. Bilateral rhonchi on exam. RSV/COVID/Flu NEGATIVE.Marland Kitchen BNP 74.2. CTA showed no definite pulmonary embolism but did show bilateral airspace disease and small pleural effusions. Given IV diuresis and working to wean O2  (only wears O2 at night)   Subjective: Just exchanged trach, feels ok. No dyspnea. Hasn't ambulated with pulse oximetry yet.   Objective: BP 107/61 (BP Location: Left Arm)   Pulse 75   Temp 97.9 F (36.6 C) (Oral)   Resp 18   Ht 5\' 6"  (1.676 m)   Wt (!) 180.1 kg   LMP 11/29/2015   SpO2 (!) 88%   BMI 64.08 kg/m   Gen: No distress Pulm: Trach site c/d/I, strong phonation with PMV, nonlabored mildly tachypneic, clear, distant  CV: RRR, no MRG, +LE edema GI: Soft, NT, ND, +BS  Neuro: Alert and oriented. No new focal deficits. Ext: Warm, no deformities. Skin: No new rashes, lesions or ulcers on visualized skin   Assessment & Plan: Acute on chronic hypoxic and hypercapnic respiratory failure due to acute on chronic HFpEF/tracheostomy dependent: s/p trach May 2023. - Echo showed LVEF 55-60%. Restarted home lasix at 40mg  dose, continue spironolactone 25mg  daily, and home farxiga. Edema remains, though some chronicity to this.  - Transition to cuffless trach today per PCCM, whose reassessment is appreciated. Will plan to monitor overnight, check pulse oximetry with ambulation, likely DC in AM.  Will follow up in trach  clinic in 4-6 weeks. - No wheezing to suggest needs further steroids, has completed antibiotics with ceftriaxone, azithromycin 2/19 - 2/23). WBC normal.  - Urged to continue pulmonary hygiene/mobilizing as tolerated.    OSA  OHS:  - Mandatory aerosol trach collar qHS and prn SpO2 < 89%.    History of VTE:  - Continue eliquis   Hypoglycemia: Resolved.   Class III obesity: Body mass index is 64.08 kg/m.   Tyrone Nine, MD [facility] [email or web address] 09/10/2023, 4:12 PM

## 2023-09-10 NOTE — Progress Notes (Signed)
 Physical Therapy Treatment Patient Details Name: Angel Brewer MRN: [id number] DOB: 06/11/1966 Today's Date: 09/10/2023   History of Present Illness 58 yo female admitted 2/19 with SOB, acute on chronic hypoxic and hypercapnic respiratory failure and decompensated heart failure. Vent support 2/19-2/20. PMHx: HTN, DVT, PE, morbid obesity, wrist ligament reconstruction, trach dependent with nocturnal O2, sleep apnea    PT Comments  Pt is slowly progressing. Pt was able to slightly increase gait distance on decreased O2 requirements this session demonstrating improvement in activity tolerance/endurance. Due to pt current functional status, home set up and available assistance at home no recommended skilled physical therapy services at this time on discharge from acute care hospital setting. Will continue to follow in acute setting in order to ensure that pt returns home with decreased risk for falls, injury, re-hospitalization and improved activity tolerance.      If plan is discharge home, recommend the following: A Bainter help with walking and/or transfers     Equipment Recommendations  None recommended by PT       Precautions / Restrictions Precautions Precautions: Other (comment) Precaution/Restrictions Comments: trach Restrictions Weight Bearing Restrictions Per Provider Order: No     Mobility  Bed Mobility   General bed mobility comments: Up in recliner on arrival and departure.    Transfers Overall transfer level: Modified independent Equipment used: None     General transfer comment: Slightly increased BOS and used chair to stablize self.    Ambulation/Gait Ambulation/Gait assistance: Supervision Gait Distance (Feet): 100 Feet Assistive device: None Gait Pattern/deviations: Step-through pattern, Decreased stride length, Wide base of support Gait velocity: decreased Gait velocity interpretation: <1.31 ft/sec, indicative of household ambulator   General Gait Details:  pt with slow steady gait with SPO2 low to mid 90's on 6L at 30%     Balance Overall balance assessment: Mild deficits observed, not formally tested        Communication Communication Factors Affecting Communication: [name] valve  Cognition Arousal: Alert Behavior During Therapy: WFL for tasks assessed/performed   PT - Cognitive impairments: No apparent impairments   Following commands: Intact      Cueing Cueing Techniques: Verbal cues     General Comments General comments (skin integrity, edema, etc.): no noted skin issues outside of gown. Pt had no signs/symptoms of cardiac/respiratory distress throughout session.      Pertinent Vitals/Pain Pain Assessment Pain Assessment: No/denies pain     PT Goals (current goals can now be found in the care plan section) Acute Rehab PT Goals Patient Stated Goal: return home and to work PT Goal Formulation: With patient Time For Goal Achievement: 09/22/23 Potential to Achieve Goals: Good Progress towards PT goals: Progressing toward goals    Frequency    Min 1X/week      PT Plan  Continue with current POC        AM-PAC PT "6 Clicks" Mobility   Outcome Measure  Help needed turning from your back to your side while in a flat bed without using bedrails?: A Gulas Help needed moving from lying on your back to sitting on the side of a flat bed without using bedrails?: A Brostrom Help needed moving to and from a bed to a chair (including a wheelchair)?: None Help needed standing up from a chair using your arms (e.g., wheelchair or bedside chair)?: None Help needed to walk in hospital room?: A Gault Help needed climbing 3-5 steps with a railing? : A Jasmer 6 Click Score: 20  End of Session Equipment Utilized During Treatment: Oxygen Activity Tolerance: Patient tolerated treatment well Patient left: in chair;with call bell/phone within reach Nurse Communication: Mobility status PT Visit Diagnosis: Other  abnormalities of gait and mobility (R26.89)     Time: 1610-9604 PT Time Calculation (min) (ACUTE ONLY): 25 min  Charges:    $Therapeutic Activity: 23-37 mins PT General Charges $$ ACUTE PT VISIT: 1 Visit                     Harrel Carina, DPT, CLT  Acute Rehabilitation Services Office: [phone] (Secure chat preferred)    Claudia Desanctis 09/10/2023, 4:46 PM

## 2023-09-10 NOTE — Progress Notes (Signed)
 NAMEVail Brewer, MRN:  [id number], DOB:  1966/02/01, LOS: 8 ADMISSION DATE:  09/02/2023, CONSULTATION DATE:  09/02/23 REFERRING MD:  MD Rhae Hammock  CHIEF COMPLAINT:  SOB  History of Present Illness:  Patient is a 58yr old female with significant hx of OSA Hyperventilation Syndrome, COPD, CHF, DVT, PE on eliquis, Sleep Apnea, s/p tracheostomy (6 cuffless trach). Last Trache follow up at [facility] was 04/21/22 where she was not requiring nocturnal ventilation and her activity tolerance had improved.  Patient presented today by EMS with significant shortness of breath, which began last night with the inability to fall asleep. Upon arrival to ED, patient was noted to be cyanotic on finger tips and hypoxic. Patient was placed on ventilator due to worsening respiratory failure with hypoxia and hypercarbia. PCCM was consulted for admit and further medical management.   Pertinent  Medical History   Past Medical History:  Diagnosis Date   Chronic diastolic CHF (congestive heart failure) (HCC) 01/05/2022   DVT (deep vein thrombosis) in pregnancy    RLE DVT 02/2016   Dyspnea    Essential hypertension 08/25/2017   Hyperlipidemia 11/21/2021   Morbid obesity (HCC)    Obesity hypoventilation syndrome (HCC) 05/03/2021   PE (pulmonary embolism) 02/29/2016   Sleep apnea    uses a cpap   Tracheostomy present (HCC) 01/05/2022   Size 6 shiley/cuffless  Last changed: 10/9     Discussion  Stable trach.  She does get a Khalid more short of breath with exertion.  Although I did not observe it on my exam she does endorse she will occasionally have a rush of air after removing PMV following activity.  This could suggest some mild air trapping from the trach.  Otherwise she is doing fantastic with the trach     Plan  Cont routin     Significant Hospital Events: Including procedures, antibiotic start and stop dates in addition to other pertinent events   2/20 trach exchanged to cuffed trach, placed on vent started on  abx steroids lasix  2/27 follow-up.  Doing better now on a 28% aerosol trach collar, and really only using this intermittently ready for tracheostomy change  Interim History / Subjective:  She feels better.  No distress  Objective   Blood pressure 107/61, pulse 68, temperature 97.9 F (36.6 C), temperature source Oral, resp. rate 17, height 5\' 6"  (1.676 m), weight (!) 180.1 kg, last menstrual period 11/29/2015, SpO2 92%.    FiO2 (%):  [28 %] 28 %   Intake/Output Summary (Last 24 hours) at 09/10/2023 1257 Last data filed at 09/10/2023 0500 Gross per 24 hour  Intake --  Output 1800 ml  Net -1800 ml   Filed Weights   09/08/23 0500 09/09/23 0354 09/10/23 0500  Weight: (!) 182.9 kg (!) 186 kg (!) 180.1 kg    Examination:  General 58 year old female well-known to me sitting up in her chair she is in no acute distress today HEENT normal cephalic atraumatic no jugular venous distention observable but this is limited by body habitus.  She has a size 6 cuffed tracheostomy in place.  The cuff is deflated her phonation with PMV is strong no upper airway stridor noted Pulmonary diminished bases no accessory use 28% however really on room air as aerosol trach collar has been pushed to the side most of the day Cardiac: Regular rate and rhythm Abdomen: Soft nontender no organomegaly Extremities warm and dry, has her chronic lower extremity edema Neuro: Currently at baseline.  Resolved Hospital Problem list   AKI  Acute on chronic resp failure  Bilateral effusions  Assessment & Plan:   AoC hypercarbic and hypoxic resp failure 2/2 acute cor pulmonale and diastolic HF (likely from type 3 PH and exacerbated by nocturnal hypoxia) OHS and OSA w/ Chronic trach dependence  COPD Hx PE   Discussion Now weaned down on supplemental oxygen of 28%. She is now down 9 liters. Getting better. I suspect some of her decompensation has to do with not using her nocturnal O2 which is something she and I have  discussed at length now   plan Will change her to a cuffless size 6 tracheostomy today Continue aerosol trach collar mandatory at at bedtime, and as needed during the day to ensure pulse oximetry at least higher than 88% Would highly recommend walking oximetry prior to discharge to ensure she is not getting hypoxic with activity I have already alerted my clinic, we will set her up to see [place] again in 4 to 6 weeks for trach change and follow-up     Best Practice (right click and "Reselect all SmartList Selections" daily)  Per primary

## 2023-09-11 DIAGNOSIS — J9602 Acute respiratory failure with hypercapnia: Secondary | ICD-10-CM | POA: Diagnosis not present

## 2023-09-11 DIAGNOSIS — J9601 Acute respiratory failure with hypoxia: Secondary | ICD-10-CM | POA: Diagnosis not present

## 2023-09-11 LAB — GLUCOSE, CAPILLARY: Glucose-Capillary: 95 mg/dL (ref 70–99)

## 2023-09-11 NOTE — Discharge Summary (Signed)
 Physician Discharge Summary   Patient: Angel Brewer MRN: [id number] DOB: 03/28/1966  Admit date:     09/02/2023  Discharge date: 09/11/2023  Discharge Physician: Tyrone Nine   PCP: Claiborne Rigg, NP   Recommendations at discharge:  Follow up in trach clinic in 4-6 weeks.  Discharge Diagnoses: Principal Problem:   Acute respiratory failure (HCC) Active Problems:   Acute hypoxemic respiratory failure (HCC)   COPD exacerbation [facility])  Hospital Course: This morbidly obese 58 year old female was admitted on 09/02/2023 with shortness of breath, which began last night with the inability to fall asleep. Upon arrival to ED, patient was noted to be hypoxic and cyanotic. She has a tracheostomy and was placed on mechanical ventilatory support due to worsening respiratory failure with hypoxia and hypercarbia. PCCM was consulted for admission and further medical management. Productive cough. Afebrile. Bilateral rhonchi on exam. RSV/COVID/Flu NEGATIVE.Marland Kitchen BNP 74.2. CTA showed no definite pulmonary embolism but did show bilateral airspace disease and small pleural effusions. Given IV diuresis and ultimately weaned from supplemental oxygen during day/exertion. Exchanged to cuffless on day before discharge and has remained stable.   Assessment and Plan: Acute on chronic hypoxic and hypercapnic respiratory failure due to acute on chronic HFpEF/tracheostomy dependent: s/p trach May 2023. - Echo showed LVEF 55-60%. Restarted home lasix at 40mg  dose, continue spironolactone 25mg  daily, and home farxiga. Edema remains, is chronic. - Transitioned to cuffless trach 2/27 per PCCM, whose reassessment is appreciated. Will follow up in trach clinic in 4-6 weeks. - No wheezing to suggest needs further steroids, has completed antibiotics with ceftriaxone, azithromycin (2/19 - 2/23). WBC normal.  - Urged to continue pulmonary hygiene/mobilizing as tolerated.    OSA  OHS:  - Mandatory nocturnal oxygen  supplementation.   History of VTE:  - Continue eliquis   Hypoglycemia: Resolved.    Class III obesity: Body mass index is 64.08 kg/m.   Consultants: PCCM Procedures performed: None  Disposition: Home Diet recommendation:  Cardiac diet DISCHARGE MEDICATION: Allergies as of 09/11/2023   No Known Allergies      Medication List     TAKE these medications    amLODipine 5 MG tablet Commonly known as: NORVASC Take 1 tablet (5 mg total) by mouth daily.   apixaban 5 MG Tabs tablet Commonly known as: ELIQUIS Take 1 tablet (5 mg total) by mouth 2 (two) times daily.   atorvastatin 20 MG tablet Commonly known as: LIPITOR Take 1 tablet (20 mg total) by mouth daily.   dapagliflozin propanediol 10 MG Tabs tablet Commonly known as: FARXIGA Take 1 tablet (10 mg total) by mouth daily.   furosemide 20 MG tablet Commonly known as: LASIX Take 1 tablet (20 mg total) by mouth daily. Take 40 mg or 2 tablets for the next 5 days then resume 20 mg or 1 tablet by mouth daily by mouth.   Misc. Devices Misc Please supply patient with tracheostomy humidifier  Z93.0   PRESCRIPTION MEDICATION 1 each by Other route at bedtime. CPAP- At bedtime   spironolactone 25 MG tablet Commonly known as: ALDACTONE Take 1 tablet (25 mg total) by mouth daily.        Follow-up Information     Claiborne Rigg, NP Follow up.   Specialty: Nurse Practitioner Contact information: [address] [place] [address] [phone]         Simonne Martinet, NP Follow up.   Specialties: Nurse Practitioner, Acute Care, Pulmonary Disease Contact information: [address]  [address] [place] [address] [phone]                Discharge Exam: Ceasar Mons Weights   09/09/23 0354 09/10/23 0500 09/11/23 0508  Weight: (!) 186 kg (!) 180.1 kg (!) 178.3 kg  BP 126/60 (BP Location: Left Wrist)   Pulse 73   Temp 97.9 F (36.6 C) (Oral)   Resp 18   Ht 5\' 6"  (1.676 m)   Wt (!)  178.3 kg   LMP 11/29/2015   SpO2 92%   BMI 63.43 kg/m   Gen: No distress Pulm: Trach site c/d/I, strong phonation with PMV, nonlabored mildly tachypneic, clear, distant  CV: RRR, no MRG, +LE edema  Condition at discharge: stable  The results of significant diagnostics from this hospitalization (including imaging, microbiology, ancillary and laboratory) are listed below for reference.   Imaging Studies: ECHOCARDIOGRAM COMPLETE Result Date: 09/03/2023    ECHOCARDIOGRAM REPORT   Patient Name:   Angel Brewer Date of Exam: 09/03/2023 Medical Rec #:  [id number]      Height:       66.0 in Accession #:    [id number]     Weight:       390.7 lb Date of Birth:  [birth year]      BSA:          2.658 m Patient Age:    58 years       BP:           113/72 mmHg Patient Gender: F              HR:           58 bpm. Exam Location:  Inpatient Procedure: 2D Echo, Cardiac Doppler, Color Doppler and Intracardiac            Opacification Agent (Both Spectral and Color Flow Doppler were            utilized during procedure). Indications:    Resp Distress  History:        Patient has prior history of Echocardiogram examinations, most                 recent 11/22/2021. CHF, COPD; Risk Factors:Hypertension.  Sonographer:    Amy Chionchio Referring Phys: [id number] Henrene Hawking  Sonographer Comments: Technically difficult study due to poor echo windows. Image acquisition challenging due to patient body habitus. IMPRESSIONS  1. Left ventricular ejection fraction, by estimation, is 55 to 60%. The left ventricle has normal function. Left ventricular endocardial border not optimally defined to evaluate regional wall motion. Left ventricular diastolic parameters were normal.  2. Right ventricular systolic function is mildly reduced. The right ventricular size is mildly enlarged. There is moderately elevated pulmonary artery systolic pressure.  3. The mitral valve is grossly normal. No evidence of mitral valve regurgitation. No evidence of  mitral stenosis.  4. The aortic valve was not well visualized. Aortic valve regurgitation is not visualized. No aortic stenosis is present.  5. The inferior vena cava is dilated in size with <50% respiratory variability, suggesting right atrial pressure of 15 mmHg. FINDINGS  Left Ventricle: Left ventricular ejection fraction, by estimation, is 55 to 60%. The left ventricle has normal function. Left ventricular endocardial border not optimally defined to evaluate regional wall motion. Definity contrast agent was given IV to delineate the left ventricular endocardial borders. The left ventricular internal cavity size was normal in size. There is no left ventricular hypertrophy. Left ventricular diastolic parameters were normal. Right  Ventricle: The right ventricular size is mildly enlarged. Right vetricular wall thickness was not well visualized. Right ventricular systolic function is mildly reduced. There is moderately elevated pulmonary artery systolic pressure. The tricuspid  regurgitant velocity is 3.14 m/s, and with an assumed right atrial pressure of 15 mmHg, the estimated right ventricular systolic pressure is 54.4 mmHg. Left Atrium: Left atrial size was normal in size. Right Atrium: Right atrial size was normal in size. Pericardium: There is no evidence of pericardial effusion. Mitral Valve: The mitral valve is grossly normal. No evidence of mitral valve regurgitation. No evidence of mitral valve stenosis. MV peak gradient, 5.9 mmHg. The mean mitral valve gradient is 2.0 mmHg. Tricuspid Valve: The tricuspid valve is not well visualized. Tricuspid valve regurgitation is trivial. Aortic Valve: The aortic valve was not well visualized. Aortic valve regurgitation is not visualized. No aortic stenosis is present. Aortic valve mean gradient measures 6.0 mmHg. Aortic valve peak gradient measures 10.4 mmHg. Aortic valve area, by VTI measures 2.75 cm. Pulmonic Valve: The pulmonic valve was not well visualized.  Pulmonic valve regurgitation is trivial. Aorta: The aortic root and ascending aorta are structurally normal, with no evidence of dilitation. Venous: The inferior vena cava was not well visualized. The inferior vena cava is dilated in size with less than 50% respiratory variability, suggesting right atrial pressure of 15 mmHg. IAS/Shunts: The interatrial septum was not well visualized.  LEFT VENTRICLE PLAX 2D LVIDd:         4.50 cm   Diastology LVIDs:         3.30 cm   LV e' medial:    10.10 cm/s LV PW:         0.80 cm   LV E/e' medial:  8.4 LV IVS:        0.70 cm   LV e' lateral:   8.70 cm/s LVOT diam:     2.20 cm   LV E/e' lateral: 9.8 LV SV:         93 LV SV Index:   35 LVOT Area:     3.80 cm  RIGHT VENTRICLE RV S prime:     11.80 cm/s TAPSE (M-mode): 1.6 cm AORTIC VALVE                     PULMONIC VALVE AV Area (Vmax):    2.90 cm      PV Vmax:       1.15 m/s AV Area (Vmean):   2.68 cm      PV Peak grad:  5.3 mmHg AV Area (VTI):     2.75 cm AV Vmax:           161.00 cm/s AV Vmean:          113.000 cm/s AV VTI:            0.339 m AV Peak Grad:      10.4 mmHg AV Mean Grad:      6.0 mmHg LVOT Vmax:         123.00 cm/s LVOT Vmean:        79.700 cm/s LVOT VTI:          0.245 m LVOT/AV VTI ratio: 0.72  AORTA Ao Root diam: 3.45 cm Ao Asc diam:  3.50 cm MITRAL VALVE               TRICUSPID VALVE MV Area (PHT): 2.86 cm    TR Peak grad:   39.4 mmHg MV Area VTI:  2.42 cm    TR Vmax:        314.00 cm/s MV Peak grad:  5.9 mmHg MV Mean grad:  2.0 mmHg    SHUNTS MV Vmax:       1.21 m/s    Systemic VTI:  0.24 m MV Vmean:      67.6 cm/s   Systemic Diam: 2.20 cm MV Decel Time: 265 msec MV E velocity: 85.00 cm/s MV A velocity: 84.40 cm/s MV E/A ratio:  1.01 Weston Brass MD Electronically signed by Weston Brass MD Signature Date/Time: 09/03/2023/12:10:51 PM    Final    DG Chest Port 1 View Result Date: 09/03/2023 CLINICAL DATA:  Respiratory failure. EXAM: PORTABLE CHEST 1 VIEW COMPARISON:  09/02/2023 FINDINGS: The  cardio pericardial silhouette is enlarged. Vascular congestion. Diffuse bilateral airspace opacity with improvement in aeration in the central lungs bilaterally. No substantial pleural effusion. Tracheostomy tube again noted. Telemetry leads overlie the chest. IMPRESSION: Mild improvement in central lung aeration. Enlargement of the cardiopericardial silhouette with vascular congestion and diffuse airspace disease compatible with edema or diffuse infection Electronically Signed   By: Kennith Center M.D.   On: 09/03/2023 05:36   CT Angio Chest Pulmonary Embolism (PE) W or WO Contrast Result Date: 09/02/2023 CLINICAL DATA:  Shortness of breath. Concern for pulmonary embolism. EXAM: CT ANGIOGRAPHY CHEST WITH CONTRAST TECHNIQUE: Multidetector CT imaging of the chest was performed using the standard protocol during bolus administration of intravenous contrast. Multiplanar CT image reconstructions and MIPs were obtained to evaluate the vascular anatomy. RADIATION DOSE REDUCTION: This exam was performed according to the departmental dose-optimization program which includes automated exposure control, adjustment of the mA and/or kV according to patient size and/or use of iterative reconstruction technique. CONTRAST:  90mL OMNIPAQUE IOHEXOL 350 MG/ML SOLN COMPARISON:  Chest radiograph dated 09/02/2023. FINDINGS: Evaluation is very limited due to severe streak artifact caused by body habitus and patient's arms. Cardiovascular: There is cardiomegaly. No pericardial effusion. Mild atherosclerotic calcification of the thoracic aorta. No aneurysmal dilatation. There is dilatation of the main pulmonary trunk suggestive of pulmonary hypertension. Evaluation for pulmonary embolism is very limited, almost nondiagnostic, due to factors above. No large central pulmonary artery embolus identified. Mediastinum/Nodes: Mildly enlarged bilateral hilar lymph nodes. No mediastinal fluid collection. Lungs/Pleura: Bilateral patchy  consolidative changes may represent edema, pneumonia, or combination. Small bilateral pleural effusions. No pneumothorax. Tracheostomy approximately 4.5 cm above the carina. The central airways are patent. Upper Abdomen: Small gallstones. Musculoskeletal: Osteopenia with degenerative changes. No acute osseous pathology. Review of the MIP images confirms the above findings. IMPRESSION: 1. Very limited exam. No large central pulmonary artery embolus identified. 2. Cardiomegaly with pulmonary edema, pneumonia, or combination. 3. Small bilateral pleural effusions. 4. Cholelithiasis. 5.  Aortic Atherosclerosis (ICD10-I70.0). Electronically Signed   By: Elgie Collard M.D.   On: 09/02/2023 16:34   DG CHEST PORT 1 VIEW Result Date: 09/02/2023 CLINICAL DATA:  Tracheostomy tube. EXAM: PORTABLE CHEST 1 VIEW COMPARISON:  Portable chest obtained earlier today. FINDINGS: The examination is limited due to underexposure related to body habitus. Tracheostomy tube in satisfactory position. Stable enlarged cardiac silhouette. No significant change in bilateral patchy airspace opacity. No visible pleural fluid. Thoracic spine degenerative changes. IMPRESSION: 1. Tracheostomy tube in satisfactory position. 2. No significant change in bilateral patchy airspace opacity, compatible with multi lobar pneumonia. Alveolar edema is less likely in the absence of other findings suggesting congestive heart failure. Electronically Signed   By: Beckie Salts M.D.   On: 09/02/2023  16:08   DG Chest Portable 1 View Result Date: 09/02/2023 CLINICAL DATA:  Shortness of breath, cyanotic fingertips and lips. EXAM: PORTABLE CHEST 1 VIEW COMPARISON:  Radiograph 06/16/2022 and CT chest 06/20/2022 FINDINGS: Tracheostomy is unchanged. Cardiomegaly. Bilateral diffuse hazy airspace and interstitial opacities with a lower lung and perihilar predominance. Question layering bilateral pleural effusions. No pneumothorax. IMPRESSION: Cardiomegaly with pulmonary  edema and possible layering bilateral pleural effusions. Electronically Signed   By: Minerva Fester M.D.   On: 09/02/2023 13:05    Microbiology: Results for orders placed or performed during the hospital encounter of 09/02/23  Resp panel by RT-PCR (RSV, Flu A&B, Covid) Anterior Nasal Swab     Status: None   Collection Time: 09/02/23 12:34 PM   Specimen: Anterior Nasal Swab  Result Value Ref Range Status   SARS Coronavirus 2 by RT PCR NEGATIVE NEGATIVE Final   Influenza A by PCR NEGATIVE NEGATIVE Final   Influenza B by PCR NEGATIVE NEGATIVE Final    Comment: (NOTE) The Xpert Xpress SARS-CoV-2/FLU/RSV plus assay is intended as an aid in the diagnosis of influenza from Nasopharyngeal swab specimens and should not be used as a sole basis for treatment. Nasal washings and aspirates are unacceptable for Xpert Xpress SARS-CoV-2/FLU/RSV testing.  Fact Sheet for Patients: BloggerCourse.com  Fact Sheet for Healthcare Providers: SeriousBroker.it  This test is not yet approved or cleared by the [place] FDA and has been authorized for detection and/or diagnosis of SARS-CoV-2 by FDA under an Emergency Use Authorization (EUA). This EUA will remain in effect (meaning this test can be used) for the duration of the COVID-19 declaration under Section 564(b)(1) of the Act, 21 U.S.C. section 360bbb-3(b)(1), unless the authorization is terminated or revoked.     Resp Syncytial Virus by PCR NEGATIVE NEGATIVE Final    Comment: (NOTE) Fact Sheet for Patients: BloggerCourse.com  Fact Sheet for Healthcare Providers: SeriousBroker.it  This test is not yet approved or cleared by the [place] FDA and has been authorized for detection and/or diagnosis of SARS-CoV-2 by FDA under an Emergency Use Authorization (EUA). This EUA will remain in effect (meaning this test can be used) for the duration  of the COVID-19 declaration under Section 564(b)(1) of the Act, 21 U.S.C. section 360bbb-3(b)(1), unless the authorization is terminated or revoked.  Performed at [facility], [address], [place], [place] [address]   Culture, blood (routine x 2)     Status: None   Collection Time: 09/02/23 12:49 PM   Specimen: BLOOD RIGHT ARM  Result Value Ref Range Status   Specimen Description BLOOD RIGHT ARM  Final   Special Requests   Final    BOTTLES DRAWN AEROBIC AND ANAEROBIC Blood Culture results may not be optimal due to an inadequate volume of blood received in culture bottles   Culture   Final    NO GROWTH 5 DAYS Performed at [facility], [address], [place], [place] [address]    Report Status 09/07/2023 FINAL  Final  Culture, blood (routine x 2)     Status: None   Collection Time: 09/02/23  1:50 PM   Specimen: BLOOD RIGHT ARM  Result Value Ref Range Status   Specimen Description BLOOD RIGHT ARM  Final   Special Requests   Final    AEROBIC BOTTLE ONLY Blood Culture results may not be optimal due to an inadequate volume of blood received in culture bottles   Culture   Final    NO GROWTH  5 DAYS Performed at [facility], [address], [place], [place] [address]    Report Status 09/07/2023 FINAL  Final  MRSA Next Gen by PCR, Nasal     Status: None   Collection Time: 09/02/23  8:59 PM   Specimen: Nasal Mucosa; Nasal Swab  Result Value Ref Range Status   MRSA by PCR Next Gen NOT DETECTED NOT DETECTED Final    Comment: (NOTE) The GeneXpert MRSA Assay (FDA approved for NASAL specimens only), is one component of a comprehensive MRSA colonization surveillance program. It is not intended to diagnose MRSA infection nor to guide or monitor treatment for MRSA infections. Test performance is not FDA approved in patients less than 68 years old. Performed at [facility], [address] [place], [place] [address]   Respiratory (~20 pathogens) panel by PCR      Status: None   Collection Time: 09/03/23  5:01 PM   Specimen: Nasopharyngeal Swab; Respiratory  Result Value Ref Range Status   Adenovirus NOT DETECTED NOT DETECTED Final   Coronavirus 229E NOT DETECTED NOT DETECTED Final    Comment: (NOTE) The Coronavirus on the Respiratory Panel, DOES NOT test for the novel  Coronavirus (2019 nCoV)    Coronavirus HKU1 NOT DETECTED NOT DETECTED Final   Coronavirus NL63 NOT DETECTED NOT DETECTED Final   Coronavirus OC43 NOT DETECTED NOT DETECTED Final   Metapneumovirus NOT DETECTED NOT DETECTED Final   Rhinovirus / Enterovirus NOT DETECTED NOT DETECTED Final   Influenza A NOT DETECTED NOT DETECTED Final   Influenza B NOT DETECTED NOT DETECTED Final   Parainfluenza Virus 1 NOT DETECTED NOT DETECTED Final   Parainfluenza Virus 2 NOT DETECTED NOT DETECTED Final   Parainfluenza Virus 3 NOT DETECTED NOT DETECTED Final   Parainfluenza Virus 4 NOT DETECTED NOT DETECTED Final   Respiratory Syncytial Virus NOT DETECTED NOT DETECTED Final   Bordetella pertussis NOT DETECTED NOT DETECTED Final   Bordetella Parapertussis NOT DETECTED NOT DETECTED Final   Chlamydophila pneumoniae NOT DETECTED NOT DETECTED Final   Mycoplasma pneumoniae NOT DETECTED NOT DETECTED Final    Comment: Performed at [facility], [address] [place], [place] [address]    Labs: CBC: Recent Labs  Lab 09/06/23 0226 09/08/23 0127 09/09/23 0516  WBC 5.8 5.9 7.0  HGB 13.5 13.2 13.3  HCT 45.1 44.0 44.2  MCV 97.0 97.3 97.1  PLT 210 169 175   Basic Metabolic Panel: Recent Labs  Lab 09/06/23 0226 09/08/23 0127 09/09/23 0516  NA 139 138 138  K 3.8 4.3 4.4  CL 91* 95* 94*  CO2 40* 40* 36*  GLUCOSE 97 89 90  BUN 30* 23* 21*  CREATININE 0.90 0.82 0.83  CALCIUM 9.0 8.7* 9.1   Liver Function Tests: No results for input(s): "AST", "ALT", "ALKPHOS", "BILITOT", "PROT", "ALBUMIN" in the last 168 hours. CBG: Recent Labs  Lab 09/10/23 1154 09/10/23 1643 09/10/23 1717  09/10/23 2116 09/11/23 0803  GLUCAP 70 61* 91 130* 95    Discharge time spent: greater than 30 minutes.  Signed: Tyrone Nine, MD [facility] 09/12/2023

## 2023-09-11 NOTE — Progress Notes (Signed)
 Nurse requested Mobility Specialist to perform oxygen saturation test with pt which includes removing pt from oxygen both at rest and while ambulating.  Below are the results from that testing.     Patient Saturations on Room Air at Rest = spO2 88%  Patient Saturations on Room Air while Ambulating = sp02 84% .    Patient Saturations on 3 Liters of oxygen while Ambulating = sp02 90%> .  At end of testing pt left in room on 0  Liters of oxygen. (Pt stated she does not need it at rest)  Reported results to nurse.   Caesar Bookman Mobility Specialist Please contact via SecureChat or [employer] [phone]

## 2023-09-11 NOTE — Progress Notes (Signed)
 Patient Saturations on Room Air at Rest = spO2 93% Patient Saturations on Room Air while Ambulating = spO2 92%

## 2023-09-11 NOTE — Plan of Care (Signed)
  Problem: Education: Goal: Knowledge of General Education information will improve Description: Including pain rating scale, medication(s)/side effects and non-pharmacologic comfort measures Outcome: Progressing   Problem: Health Behavior/Discharge Planning: Goal: Ability to manage health-related needs will improve Outcome: Progressing   Problem: Clinical Measurements: Goal: Ability to maintain clinical measurements within normal limits will improve Outcome: Progressing Goal: Will remain free from infection Outcome: Progressing Goal: Diagnostic test results will improve Outcome: Progressing Goal: Respiratory complications will improve Outcome: Progressing Goal: Cardiovascular complication will be avoided Outcome: Progressing   Problem: Activity: Goal: Risk for activity intolerance will decrease Outcome: Progressing   Problem: Nutrition: Goal: Adequate nutrition will be maintained Outcome: Progressing   Problem: Elimination: Goal: Will not experience complications related to bowel motility Outcome: Progressing Goal: Will not experience complications related to urinary retention Outcome: Progressing   Problem: Safety: Goal: Ability to remain free from injury will improve Outcome: Progressing   Problem: Activity: Goal: Ability to tolerate increased activity will improve Outcome: Progressing   Problem: Respiratory: Goal: Patent airway maintenance will improve Outcome: Progressing   Problem: Education: Goal: Ability to describe self-care measures that may prevent or decrease complications (Diabetes Survival Skills Education) will improve Outcome: Progressing Goal: Individualized Educational Video(s) Outcome: Progressing   Problem: Coping: Goal: Ability to adjust to condition or change in health will improve Outcome: Progressing   Problem: Fluid Volume: Goal: Ability to maintain a balanced intake and output will improve Outcome: Progressing   Problem: Health  Behavior/Discharge Planning: Goal: Ability to identify and utilize available resources and services will improve Outcome: Progressing Goal: Ability to manage health-related needs will improve Outcome: Progressing   Problem: Metabolic: Goal: Ability to maintain appropriate glucose levels will improve Outcome: Progressing   Problem: Nutritional: Goal: Maintenance of adequate nutrition will improve Outcome: Progressing Goal: Progress toward achieving an optimal weight will improve Outcome: Progressing   Problem: Skin Integrity: Goal: Risk for impaired skin integrity will decrease Outcome: Progressing   Problem: Tissue Perfusion: Goal: Adequacy of tissue perfusion will improve Outcome: Progressing

## 2023-09-11 NOTE — Progress Notes (Signed)
 Mobility Specialist Progress Note;   09/11/23 0907  Mobility  Activity Ambulated with assistance in hallway  Level of Assistance Standby assist, set-up cues, supervision of patient - no hands on  Assistive Device None  Distance Ambulated (ft) 100 ft  Activity Response Tolerated well  Mobility Referral Yes  Mobility visit 1 Mobility  Mobility Specialist Start Time (ACUTE ONLY) 0907  Mobility Specialist Stop Time (ACUTE ONLY) 0921  Mobility Specialist Time Calculation (min) (ACUTE ONLY) 14 min   Pt agreeable to mobility and walking O2 test. Required no physical assistance during ambulation, SV. Ambulated on 3LO2, pleth reading in and out however when accurate SPO2 reading 90%>. No c/o during session. Pt returned back to chair with all needs met.   Caesar Bookman Mobility Specialist Please contact via SecureChat or [employer] [phone]

## 2023-09-14 ENCOUNTER — Telehealth: Payer: Self-pay

## 2023-09-14 NOTE — Transitions of Care (Post Inpatient/ED Visit) (Signed)
   09/14/2023  Name: Angel Brewer MRN: [id number] DOB: 06-Apr-1966  Today's TOC FU Call Status: Today's TOC FU Call Status:: Successful TOC FU Call Completed TOC FU Call Complete Date: 09/14/23 Patient's Name and Date of Birth confirmed.  Transition Care Management Follow-up Telephone Call Date of Discharge: 09/11/23 Discharge Facility: [facility]) Type of Discharge: Inpatient Admission Primary Inpatient Discharge Diagnosis:: acute respiratory failure How have you been since you were released from the hospital?: Better (She stated she is feeling much better, just trying to get her strength back.) Any questions or concerns?: No  Items Reviewed: Did you receive and understand the discharge instructions provided?: Yes Medications obtained,verified, and reconciled?: No Medications Not Reviewed Reasons:: Other: (She said she has all of her medications except one, that she will have her sister pick up at the pharmacy. She did not have any questions about the med regime and she said she did not need to review the med list.) Any new allergies since your discharge?: No Dietary orders reviewed?: Yes Type of Diet Ordered:: heart healthy, low sodium Do you have support at home?: Yes People in Home: alone Name of Support/Comfort Primary Source: her sister has provided some assistance.  Medications Reviewed Today: Medications Reviewed Today   Medications were not reviewed in this encounter     Home Care and Equipment/Supplies: Were Home Health Services Ordered?: No Any new equipment or medical supplies ordered?: No  Functional Questionnaire: Do you need assistance with bathing/showering or dressing?: No Do you need assistance with meal preparation?: No Do you need assistance with eating?: No Do you have difficulty maintaining continence: No Do you need assistance with getting out of bed/getting out of a chair/moving?: Yes (She has home O2 which she uses @ 3L at night) Do you  have difficulty managing or taking your medications?: No  Follow up appointments reviewed: PCP Follow-up appointment confirmed?: Yes Date of PCP follow-up appointment?: 10/06/23 Follow-up Provider: Bertram Denver, NP Specialist Hospital Follow-up appointment confirmed?: No Reason Specialist Follow-Up Not Confirmed: Patient has Specialist Provider Number and will Call for Appointment (She said that she saw the pulmonologist in the hospital and he said they would call her for an appointment at the trach clinic in about 6 weeks.) Do you need transportation to your follow-up appointment?: No Do you understand care options if your condition(s) worsen?: Yes-patient verbalized understanding    SIGNATURE Robyne Peers, RN

## 2023-10-06 ENCOUNTER — Inpatient Hospital Stay: Admitting: Nurse Practitioner

## 2023-10-22 ENCOUNTER — Emergency Department (HOSPITAL_COMMUNITY): Payer: MEDICAID

## 2023-10-22 ENCOUNTER — Inpatient Hospital Stay (HOSPITAL_COMMUNITY)
Admission: EM | Admit: 2023-10-22 | Discharge: 2023-11-26 | DRG: 208 | Disposition: A | Discharge status: Home / Self Care | Payer: MEDICAID | Attending: Internal Medicine | Admitting: Internal Medicine

## 2023-10-22 ENCOUNTER — Encounter (HOSPITAL_COMMUNITY): Payer: Self-pay | Admitting: Emergency Medicine

## 2023-10-22 ENCOUNTER — Other Ambulatory Visit: Payer: Self-pay

## 2023-10-22 DIAGNOSIS — Z9981 Dependence on supplemental oxygen: Secondary | ICD-10-CM

## 2023-10-22 DIAGNOSIS — Z86718 Personal history of other venous thrombosis and embolism: Secondary | ICD-10-CM

## 2023-10-22 DIAGNOSIS — W1811XA Fall from or off toilet without subsequent striking against object, initial encounter: Secondary | ICD-10-CM | POA: Diagnosis present

## 2023-10-22 DIAGNOSIS — Y92012 Bathroom of single-family (private) house as the place of occurrence of the external cause: Secondary | ICD-10-CM

## 2023-10-22 DIAGNOSIS — Z6841 Body Mass Index (BMI) 40.0 and over, adult: Secondary | ICD-10-CM

## 2023-10-22 DIAGNOSIS — Z7984 Long term (current) use of oral hypoglycemic drugs: Secondary | ICD-10-CM

## 2023-10-22 DIAGNOSIS — E876 Hypokalemia: Secondary | ICD-10-CM | POA: Diagnosis not present

## 2023-10-22 DIAGNOSIS — Y92009 Unspecified place in unspecified non-institutional (private) residence as the place of occurrence of the external cause: Secondary | ICD-10-CM

## 2023-10-22 DIAGNOSIS — Z7901 Long term (current) use of anticoagulants: Secondary | ICD-10-CM

## 2023-10-22 DIAGNOSIS — G9341 Metabolic encephalopathy: Secondary | ICD-10-CM | POA: Diagnosis present

## 2023-10-22 DIAGNOSIS — G4733 Obstructive sleep apnea (adult) (pediatric): Secondary | ICD-10-CM

## 2023-10-22 DIAGNOSIS — I50810 Right heart failure, unspecified: Secondary | ICD-10-CM | POA: Diagnosis present

## 2023-10-22 DIAGNOSIS — W19XXXA Unspecified fall, initial encounter: Principal | ICD-10-CM

## 2023-10-22 DIAGNOSIS — J9611 Chronic respiratory failure with hypoxia: Secondary | ICD-10-CM | POA: Diagnosis present

## 2023-10-22 DIAGNOSIS — I10 Essential (primary) hypertension: Secondary | ICD-10-CM | POA: Diagnosis present

## 2023-10-22 DIAGNOSIS — S32010A Wedge compression fracture of first lumbar vertebra, initial encounter for closed fracture: Secondary | ICD-10-CM

## 2023-10-22 DIAGNOSIS — Z930 Tracheostomy status: Secondary | ICD-10-CM

## 2023-10-22 DIAGNOSIS — Z8679 Personal history of other diseases of the circulatory system: Secondary | ICD-10-CM

## 2023-10-22 DIAGNOSIS — E662 Morbid (severe) obesity with alveolar hypoventilation: Secondary | ICD-10-CM | POA: Diagnosis present

## 2023-10-22 DIAGNOSIS — S42017A Nondisplaced fracture of sternal end of right clavicle, initial encounter for closed fracture: Secondary | ICD-10-CM | POA: Diagnosis present

## 2023-10-22 DIAGNOSIS — E785 Hyperlipidemia, unspecified: Secondary | ICD-10-CM | POA: Diagnosis present

## 2023-10-22 DIAGNOSIS — J398 Other specified diseases of upper respiratory tract: Secondary | ICD-10-CM | POA: Diagnosis present

## 2023-10-22 DIAGNOSIS — I82442 Acute embolism and thrombosis of left tibial vein: Secondary | ICD-10-CM | POA: Diagnosis present

## 2023-10-22 DIAGNOSIS — Z86711 Personal history of pulmonary embolism: Secondary | ICD-10-CM | POA: Diagnosis present

## 2023-10-22 DIAGNOSIS — Z79899 Other long term (current) drug therapy: Secondary | ICD-10-CM

## 2023-10-22 DIAGNOSIS — E66813 Obesity, class 3: Secondary | ICD-10-CM | POA: Diagnosis present

## 2023-10-22 DIAGNOSIS — J9602 Acute respiratory failure with hypercapnia: Secondary | ICD-10-CM | POA: Diagnosis present

## 2023-10-22 DIAGNOSIS — I110 Hypertensive heart disease with heart failure: Secondary | ICD-10-CM | POA: Diagnosis present

## 2023-10-22 DIAGNOSIS — J9622 Acute and chronic respiratory failure with hypercapnia: Secondary | ICD-10-CM | POA: Diagnosis present

## 2023-10-22 DIAGNOSIS — R531 Weakness: Secondary | ICD-10-CM | POA: Diagnosis present

## 2023-10-22 DIAGNOSIS — S42001A Fracture of unspecified part of right clavicle, initial encounter for closed fracture: Secondary | ICD-10-CM

## 2023-10-22 DIAGNOSIS — R748 Abnormal levels of other serum enzymes: Secondary | ICD-10-CM | POA: Diagnosis present

## 2023-10-22 DIAGNOSIS — T07XXXA Unspecified multiple injuries, initial encounter: Secondary | ICD-10-CM

## 2023-10-22 DIAGNOSIS — Z8249 Family history of ischemic heart disease and other diseases of the circulatory system: Secondary | ICD-10-CM

## 2023-10-22 DIAGNOSIS — J9621 Acute and chronic respiratory failure with hypoxia: Principal | ICD-10-CM | POA: Diagnosis present

## 2023-10-22 DIAGNOSIS — S42202A Unspecified fracture of upper end of left humerus, initial encounter for closed fracture: Secondary | ICD-10-CM | POA: Diagnosis present

## 2023-10-22 DIAGNOSIS — Z9911 Dependence on respirator [ventilator] status: Secondary | ICD-10-CM

## 2023-10-22 DIAGNOSIS — M1712 Unilateral primary osteoarthritis, left knee: Secondary | ICD-10-CM | POA: Diagnosis present

## 2023-10-22 DIAGNOSIS — S82142A Displaced bicondylar fracture of left tibia, initial encounter for closed fracture: Secondary | ICD-10-CM | POA: Diagnosis present

## 2023-10-22 DIAGNOSIS — S32019A Unspecified fracture of first lumbar vertebra, initial encounter for closed fracture: Secondary | ICD-10-CM | POA: Diagnosis present

## 2023-10-22 DIAGNOSIS — J9601 Acute respiratory failure with hypoxia: Secondary | ICD-10-CM | POA: Diagnosis present

## 2023-10-22 DIAGNOSIS — Z5320 Procedure and treatment not carried out because of patient's decision for unspecified reasons: Secondary | ICD-10-CM | POA: Diagnosis not present

## 2023-10-22 DIAGNOSIS — I2782 Chronic pulmonary embolism: Secondary | ICD-10-CM | POA: Diagnosis present

## 2023-10-22 DIAGNOSIS — Z87891 Personal history of nicotine dependence: Secondary | ICD-10-CM

## 2023-10-22 DIAGNOSIS — I5032 Chronic diastolic (congestive) heart failure: Secondary | ICD-10-CM | POA: Diagnosis present

## 2023-10-22 LAB — PROTIME-INR
INR: 1.1 (ref 0.8–1.2)
Prothrombin Time: 14.7 s (ref 11.4–15.2)

## 2023-10-22 LAB — CBC
HCT: 44.7 % (ref 36.0–46.0)
Hemoglobin: 13.4 g/dL (ref 12.0–15.0)
MCH: 29.2 pg (ref 26.0–34.0)
MCHC: 30 g/dL (ref 30.0–36.0)
MCV: 97.4 fL (ref 80.0–100.0)
Platelets: 236 10*3/uL (ref 150–400)
RBC: 4.59 MIL/uL (ref 3.87–5.11)
RDW: 16.2 % — ABNORMAL HIGH (ref 11.5–15.5)
WBC: 9.8 10*3/uL (ref 4.0–10.5)
nRBC: 1.6 % — ABNORMAL HIGH (ref 0.0–0.2)

## 2023-10-22 LAB — COMPREHENSIVE METABOLIC PANEL WITH GFR
ALT: 11 U/L (ref 0–44)
AST: 22 U/L (ref 15–41)
Albumin: 3.2 g/dL — ABNORMAL LOW (ref 3.5–5.0)
Alkaline Phosphatase: 58 U/L (ref 38–126)
Anion gap: 11 (ref 5–15)
BUN: 17 mg/dL (ref 6–20)
CO2: 30 mmol/L (ref 22–32)
Calcium: 9.1 mg/dL (ref 8.9–10.3)
Chloride: 97 mmol/L — ABNORMAL LOW (ref 98–111)
Creatinine, Ser: 0.96 mg/dL (ref 0.44–1.00)
GFR, Estimated: >60 mL/min (ref >60)
Glucose, Bld: 104 mg/dL — ABNORMAL HIGH (ref 70–99)
Potassium: 4.5 mmol/L (ref 3.5–5.1)
Sodium: 138 mmol/L (ref 135–145)
Total Bilirubin: 1.7 mg/dL — ABNORMAL HIGH (ref 0.0–1.2)
Total Protein: 7.6 g/dL (ref 6.5–8.1)

## 2023-10-22 LAB — I-STAT CHEM 8, ED
BUN: 26 mg/dL — ABNORMAL HIGH (ref 6–20)
Calcium, Ion: 1.01 mmol/L — ABNORMAL LOW (ref 1.15–1.40)
Chloride: 101 mmol/L (ref 98–111)
Creatinine, Ser: 1.1 mg/dL — ABNORMAL HIGH (ref 0.44–1.00)
Glucose, Bld: 101 mg/dL — ABNORMAL HIGH (ref 70–99)
HCT: 50 % — ABNORMAL HIGH (ref 36.0–46.0)
Hemoglobin: 17 g/dL — ABNORMAL HIGH (ref 12.0–15.0)
Potassium: 5.7 mmol/L — ABNORMAL HIGH (ref 3.5–5.1)
Sodium: 138 mmol/L (ref 135–145)
TCO2: 36 mmol/L — ABNORMAL HIGH (ref 22–32)

## 2023-10-22 LAB — CK: Total CK: 385 U/L — ABNORMAL HIGH (ref 38–234)

## 2023-10-22 LAB — I-STAT CG4 LACTIC ACID, ED: Lactic Acid, Venous: 1.6 mmol/L (ref 0.5–1.9)

## 2023-10-22 LAB — ETHANOL: Alcohol, Ethyl (B): <10 mg/dL (ref <10)

## 2023-10-22 MED ORDER — IOHEXOL 350 MG/ML SOLN
65.0000 mL | Freq: Once | INTRAVENOUS | Status: AC | PRN
  Administered 2023-10-22: 65 mL via INTRAVENOUS

## 2023-10-22 NOTE — ED Provider Notes (Signed)
 [place] EMERGENCY DEPARTMENT AT [facility] Provider Note   CSN: [id number] Arrival date & time: 10/22/23  2039     History  Chief Complaint  Patient presents with   Fall    Angel Brewer is a 58 y.o. female.  Pt is a 58 yo female with pmhx significant for morbid obesity, PE (eliquis), HTN, HLD, CHF, and obesity hypoventilation syndrome.  Pt has a tracheostomy since 5/23 and does wear cpap and oxygen at night.  Pt lives alone.  The daughter could not get a hold of her today, so she went to check on her.  Door was locked and she could hear mom calling out.  EMS was called to the house and had to break in and knock down the bathroom door.  Pt did fall off the toilet around 1000 and was unable to get up.  Pt has diffuse pain.  EMS said O2 sat was only 60% on RA when they arrived.  She was put on 15 L NRB en route and O2 sats did come up to the mid-90s.  Pt said she did not hit her head.           Home Medications Prior to Admission medications   Medication Sig Start Date End Date Taking? Authorizing Provider  amLODipine (NORVASC) 5 MG tablet Take 1 tablet (5 mg total) by mouth daily. 06/17/23   Claiborne Rigg, NP  apixaban (ELIQUIS) 5 MG TABS tablet Take 1 tablet (5 mg total) by mouth 2 (two) times daily. 06/17/23   Claiborne Rigg, NP  atorvastatin (LIPITOR) 20 MG tablet Take 1 tablet (20 mg total) by mouth daily. 06/17/23   Claiborne Rigg, NP  dapagliflozin propanediol (FARXIGA) 10 MG TABS tablet Take 1 tablet (10 mg total) by mouth daily. 06/17/23   Claiborne Rigg, NP  furosemide (LASIX) 20 MG tablet Take 1 tablet (20 mg total) by mouth daily. Take 40 mg or 2 tablets for the next 5 days then resume 20 mg or 1 tablet by mouth daily by mouth. 06/17/23   Claiborne Rigg, NP  Misc. Devices MISC Please supply patient with tracheostomy humidifier  Z93.0 03/25/22   Claiborne Rigg, NP  PRESCRIPTION MEDICATION 1 each by Other route at bedtime. CPAP- At bedtime     [provider]  spironolactone (ALDACTONE) 25 MG tablet Take 1 tablet (25 mg total) by mouth daily. 06/17/23   Claiborne Rigg, NP      Allergies    Patient has no known allergies.    Review of Systems   Review of Systems  Musculoskeletal:        Shoulder and leg pain  All other systems reviewed and are negative.   Physical Exam Updated Vital Signs BP (!) 165/118   Pulse 96   Temp 98.6 F (37 C)   Resp (!) 25   Ht 5\' 6"  (1.676 m)   Wt (!) 178 kg   LMP 11/29/2015   SpO2 100%   BMI 63.34 kg/m  Physical Exam Vitals and nursing note reviewed.  Constitutional:      Appearance: Normal appearance. She is obese.  HENT:     Head: Normocephalic and atraumatic.     Right Ear: External ear normal.     Left Ear: External ear normal.     Nose: Nose normal.     Mouth/Throat:     Mouth: Mucous membranes are moist.     Pharynx: Oropharynx is clear.  Eyes:     Extraocular Movements: Extraocular movements intact.     Conjunctiva/sclera: Conjunctivae normal.     Pupils: Pupils are equal, round, and reactive to light.  Neck:     Comments: Trach noted Cardiovascular:     Rate and Rhythm: Normal rate and regular rhythm.     Pulses: Normal pulses.     Heart sounds: Normal heart sounds.  Pulmonary:     Effort: Pulmonary effort is normal.     Breath sounds: Normal breath sounds.  Abdominal:     General: Abdomen is flat. Bowel sounds are normal.     Palpations: Abdomen is soft.  Musculoskeletal:       Arms:     Comments: Entire left leg pain  Skin:    General: Skin is warm.     Capillary Refill: Capillary refill takes less than 2 seconds.  Neurological:     General: No focal deficit present.     Mental Status: She is alert and oriented to person, place, and time.  Psychiatric:        Mood and Affect: Mood normal.     ED Results / Procedures / Treatments   Labs (all labs ordered are listed, but only abnormal results are displayed) Labs Reviewed  COMPREHENSIVE  METABOLIC PANEL WITH GFR - Abnormal; Notable for the following components:      Result Value   Chloride 97 (*)    Glucose, Bld 104 (*)    Albumin 3.2 (*)    Total Bilirubin 1.7 (*)    All other components within normal limits  CBC - Abnormal; Notable for the following components:   RDW 16.2 (*)    nRBC 1.6 (*)    All other components within normal limits  CK - Abnormal; Notable for the following components:   Total CK 385 (*)    All other components within normal limits  I-STAT CHEM 8, ED - Abnormal; Notable for the following components:   Potassium 5.7 (*)    BUN 26 (*)    Creatinine, Ser 1.10 (*)    Glucose, Bld 101 (*)    Calcium, Ion 1.01 (*)    TCO2 36 (*)    Hemoglobin 17.0 (*)    HCT 50.0 (*)    All other components within normal limits  ETHANOL  PROTIME-INR  URINALYSIS, ROUTINE W REFLEX MICROSCOPIC  I-STAT CG4 LACTIC ACID, ED  SAMPLE TO BLOOD BANK    EKG EKG Interpretation Date/Time:  Thursday October 22 2023 20:46:23 EDT Ventricular Rate:  81 PR Interval:  172 QRS Duration:  87 QT Interval:  380 QTC Calculation: 442 R Axis:   85  Text Interpretation: Sinus rhythm Minimal ST depression No significant change since last tracing Confirmed by Jacalyn Lefevre [phone]) on 10/22/2023 11:12:36 PM  Radiology DG Femur 1 View Left Result Date: 10/22/2023 CLINICAL DATA:  [id number] Fall 190176; pain EXAM: PORTABLE LEFT KNEE - 1-2 VIEW; LEFT TIBIA AND FIBULA - 2 VIEW; LEFT FEMUR 1 VIEW; PORTABLE LEFT ANKLE - 2 VIEW COMPARISON:  None Available. FINDINGS: Acute displaced tibial plateau intercondylar fracture. No dislocation. Limited evaluation for joint effusion/lipohemarthrosis due to collimation of view and only frontal view of the femur. Severe tricompartmental degenerative changes of the knee. Soft tissues are unremarkable. IMPRESSION: 1. Acute displaced tibial plateau intercondylar fracture. 2. Limited evaluation for joint effusion/lipohemarthrosis due to collimation of view and  only frontal view of the femur. 3. Severe tricompartmental degenerative changes of the knee. 4. No other acute fracture  or dislocation of the left femur, knee, tibia fibula, ankle. Electronically Signed   By: Tish Frederickson M.D.   On: 10/22/2023 23:13   DG Tibia/Fibula Left Result Date: 10/22/2023 CLINICAL DATA:  [id number] Fall 190176; pain EXAM: PORTABLE LEFT KNEE - 1-2 VIEW; LEFT TIBIA AND FIBULA - 2 VIEW; LEFT FEMUR 1 VIEW; PORTABLE LEFT ANKLE - 2 VIEW COMPARISON:  None Available. FINDINGS: Acute displaced tibial plateau intercondylar fracture. No dislocation. Limited evaluation for joint effusion/lipohemarthrosis due to collimation of view and only frontal view of the femur. Severe tricompartmental degenerative changes of the knee. Soft tissues are unremarkable. IMPRESSION: 1. Acute displaced tibial plateau intercondylar fracture. 2. Limited evaluation for joint effusion/lipohemarthrosis due to collimation of view and only frontal view of the femur. 3. Severe tricompartmental degenerative changes of the knee. 4. No other acute fracture or dislocation of the left femur, knee, tibia fibula, ankle. Electronically Signed   By: Tish Frederickson M.D.   On: 10/22/2023 23:13   DG Ankle Left Port Result Date: 10/22/2023 CLINICAL DATA:  [id number] Fall 190176; pain EXAM: PORTABLE LEFT KNEE - 1-2 VIEW; LEFT TIBIA AND FIBULA - 2 VIEW; LEFT FEMUR 1 VIEW; PORTABLE LEFT ANKLE - 2 VIEW COMPARISON:  None Available. FINDINGS: Acute displaced tibial plateau intercondylar fracture. No dislocation. Limited evaluation for joint effusion/lipohemarthrosis due to collimation of view and only frontal view of the femur. Severe tricompartmental degenerative changes of the knee. Soft tissues are unremarkable. IMPRESSION: 1. Acute displaced tibial plateau intercondylar fracture. 2. Limited evaluation for joint effusion/lipohemarthrosis due to collimation of view and only frontal view of the femur. 3. Severe tricompartmental degenerative  changes of the knee. 4. No other acute fracture or dislocation of the left femur, knee, tibia fibula, ankle. Electronically Signed   By: Tish Frederickson M.D.   On: 10/22/2023 23:13   DG Knee Left Port Result Date: 10/22/2023 CLINICAL DATA:  [id number] Fall 190176; pain EXAM: PORTABLE LEFT KNEE - 1-2 VIEW; LEFT TIBIA AND FIBULA - 2 VIEW; LEFT FEMUR 1 VIEW; PORTABLE LEFT ANKLE - 2 VIEW COMPARISON:  None Available. FINDINGS: Acute displaced tibial plateau intercondylar fracture. No dislocation. Limited evaluation for joint effusion/lipohemarthrosis due to collimation of view and only frontal view of the femur. Severe tricompartmental degenerative changes of the knee. Soft tissues are unremarkable. IMPRESSION: 1. Acute displaced tibial plateau intercondylar fracture. 2. Limited evaluation for joint effusion/lipohemarthrosis due to collimation of view and only frontal view of the femur. 3. Severe tricompartmental degenerative changes of the knee. 4. No other acute fracture or dislocation of the left femur, knee, tibia fibula, ankle. Electronically Signed   By: Tish Frederickson M.D.   On: 10/22/2023 23:13   DG Pelvis Portable Result Date: 10/22/2023 CLINICAL DATA:  Trauma EXAM: PORTABLE PELVIS 1-2 VIEWS COMPARISON:  CT abdomen pelvis 10/22/2023 FINDINGS: Limited evaluation due to overlapping osseous structures and overlying soft tissues. There is no evidence of pelvic fracture or diastasis. No acute displaced fracture or dislocation of either hips on frontal view. No pelvic bone lesions are seen. IMPRESSION: Negative for acute traumatic injury. Electronically Signed   By: Tish Frederickson M.D.   On: 10/22/2023 22:58   DG Chest Port 1 View Result Date: 10/22/2023 CLINICAL DATA:  Trauma fall. EXAM: PORTABLE CHEST 1 VIEW COMPARISON:  CT chest 09/02/2023 FINDINGS: Tracheostomy tube terminates 8 cm above the carina. Cardiomegaly.  The heart and mediastinal contours are unchanged. No focal consolidation. No pulmonary edema.  No pleural effusion. No pneumothorax. No acute osseous abnormality. IMPRESSION: No active  disease. Electronically Signed   By: Tish Frederickson M.D.   On: 10/22/2023 22:56   CT HEAD WO CONTRAST Result Date: 10/22/2023 CLINICAL DATA:  Head trauma, moderate-severe; Polytrauma, blunt EXAM: CT HEAD WITHOUT CONTRAST CT CERVICAL SPINE WITHOUT CONTRAST TECHNIQUE: Multidetector CT imaging of the head and cervical spine was performed following the standard protocol without intravenous contrast. Multiplanar CT image reconstructions of the cervical spine were also generated. RADIATION DOSE REDUCTION: This exam was performed according to the departmental dose-optimization program which includes automated exposure control, adjustment of the mA and/or kV according to patient size and/or use of iterative reconstruction technique. COMPARISON:  CT head 08/19/2020 FINDINGS: CT HEAD FINDINGS Brain: No evidence of large-territorial acute infarction. No parenchymal hemorrhage. No mass lesion. No extra-axial collection. No mass effect or midline shift. No hydrocephalus. Basilar cisterns are patent. Vascular: No hyperdense vessel. Skull: No acute fracture or focal lesion. Sinuses/Orbits: Paranasal sinuses and mastoid air cells are clear. The orbits are unremarkable. Other: None. CT CERVICAL SPINE FINDINGS Alignment: Normal. Skull base and vertebrae: Limited evaluation due to quantum mottle artifact. No acute fracture. No aggressive appearing focal osseous lesion or focal pathologic process. Soft tissues and spinal canal: No prevertebral fluid or swelling. No visible canal hematoma. Upper chest: Unremarkable. Other: Tracheostomy tube tip 7 cm above the carina. IMPRESSION: 1. No acute intracranial abnormality. 2. No acute displaced fracture or traumatic listhesis of the cervical spine. Limited evaluation due to body habitus. Electronically Signed   By: Tish Frederickson M.D.   On: 10/22/2023 22:55   CT CERVICAL SPINE WO CONTRAST Result  Date: 10/22/2023 CLINICAL DATA:  Head trauma, moderate-severe; Polytrauma, blunt EXAM: CT HEAD WITHOUT CONTRAST CT CERVICAL SPINE WITHOUT CONTRAST TECHNIQUE: Multidetector CT imaging of the head and cervical spine was performed following the standard protocol without intravenous contrast. Multiplanar CT image reconstructions of the cervical spine were also generated. RADIATION DOSE REDUCTION: This exam was performed according to the departmental dose-optimization program which includes automated exposure control, adjustment of the mA and/or kV according to patient size and/or use of iterative reconstruction technique. COMPARISON:  CT head 08/19/2020 FINDINGS: CT HEAD FINDINGS Brain: No evidence of large-territorial acute infarction. No parenchymal hemorrhage. No mass lesion. No extra-axial collection. No mass effect or midline shift. No hydrocephalus. Basilar cisterns are patent. Vascular: No hyperdense vessel. Skull: No acute fracture or focal lesion. Sinuses/Orbits: Paranasal sinuses and mastoid air cells are clear. The orbits are unremarkable. Other: None. CT CERVICAL SPINE FINDINGS Alignment: Normal. Skull base and vertebrae: Limited evaluation due to quantum mottle artifact. No acute fracture. No aggressive appearing focal osseous lesion or focal pathologic process. Soft tissues and spinal canal: No prevertebral fluid or swelling. No visible canal hematoma. Upper chest: Unremarkable. Other: Tracheostomy tube tip 7 cm above the carina. IMPRESSION: 1. No acute intracranial abnormality. 2. No acute displaced fracture or traumatic listhesis of the cervical spine. Limited evaluation due to body habitus. Electronically Signed   By: Tish Frederickson M.D.   On: 10/22/2023 22:55   CT CHEST ABDOMEN PELVIS W CONTRAST Result Date: 10/22/2023 CLINICAL DATA:  Trauma, fall on blood thinners EXAM: CT CHEST, ABDOMEN, AND PELVIS WITH CONTRAST TECHNIQUE: Multidetector CT imaging of the chest, abdomen and pelvis was performed  following the standard protocol during bolus administration of intravenous contrast. RADIATION DOSE REDUCTION: This exam was performed according to the departmental dose-optimization program which includes automated exposure control, adjustment of the mA and/or kV according to patient size and/or use of iterative reconstruction technique. CONTRAST:  65mL  OMNIPAQUE IOHEXOL 350 MG/ML SOLN COMPARISON:  CT chest angiogram, 09/02/2023 FINDINGS: CT CHEST FINDINGS Cardiovascular: Aortic atherosclerosis. Cardiomegaly. No pericardial effusion. Mediastinum/Nodes: No enlarged mediastinal, hilar, or axillary lymph nodes. Thyroid gland, trachea, and esophagus demonstrate no significant findings. Lungs/Pleura: Tracheostomy. Mild bibasilar scarring or atelectasis and diffuse mosaic attenuation of the airspaces, suggesting small airways disease. No pleural effusion or pneumothorax. Musculoskeletal: Soft tissue contusion and fat stranding involving the right sternocleidomastoid muscle and overlying superficial subcutaneous soft tissues (series 3, image 6). Nondisplaced fractures of the right clavicular head (series 6, image 74). No dislocation of the sternoclavicular joint. Suspect nondisplaced fractures of the proximal left humerus, incompletely imaged and suboptimally assessed (series 6, image 65). CT ABDOMEN PELVIS FINDINGS Hepatobiliary: Hepatomegaly, maximum coronal span 20.5 cm. No solid liver abnormality is seen. No gallstones, gallbladder wall thickening, or biliary dilatation. Pancreas: Unremarkable. No pancreatic ductal dilatation or surrounding inflammatory changes. Spleen: Normal in size without significant abnormality. Adrenals/Urinary Tract: Unchanged, definitively benign right adrenal adenoma, requiring no further follow-up or characterization. Kidneys are normal, without renal calculi, solid lesion, or hydronephrosis. Bladder is unremarkable. Stomach/Bowel: Stomach is within normal limits. Appendix appears normal. No  evidence of bowel wall thickening, distention, or inflammatory changes. Vascular/Lymphatic: Multiple small left ovarian vein phleboliths, which do not reflect urinary tract calculi. No enlarged abdominal or pelvic lymph nodes. Reproductive: No mass or other abnormality. Other: Small, fat containing umbilical hernia.  No ascites. Musculoskeletal: New, likely acute superior endplate deformity of L1 without significant anterior height loss (series 7, image 64). Disc degenerative disease and bridging osteophytosis throughout the thoracic spine. IMPRESSION: 1. New, likely acute superior endplate deformity of L1 without significant anterior height loss. 2. Nondisplaced fractures of the right clavicular head. No dislocation of the sternoclavicular joint. Soft tissue contusion and fat stranding involving the right sternocleidomastoid muscle and overlying superficial subcutaneous soft tissues. 3. Suspect nondisplaced fractures of the proximal left humerus, incompletely imaged and suboptimally assessed. 4. No CT evidence of organ injury to the chest, abdomen, or pelvis. 5. Tracheostomy. 6. Cardiomegaly. Aortic Atherosclerosis (ICD10-I70.0). Electronically Signed   By: Jearld Lesch M.D.   On: 10/22/2023 22:10    Procedures Procedures    Medications Ordered in ED Medications  iohexol (OMNIPAQUE) 350 MG/ML injection 65 mL (65 mLs Intravenous Contrast Given 10/22/23 2148)    ED Course/ Medical Decision Making/ A&P                                 Medical Decision Making Amount and/or Complexity of Data Reviewed Labs: ordered. Radiology: ordered.  Risk Prescription drug management.   This patient presents to the ED for concern of fall, this involves an extensive number of treatment options, and is a complaint that carries with it a high risk of complications and morbidity.  The differential diagnosis includes multiple trauma   Co morbidities that complicate the patient evaluation  morbid obesity, PE  (eliquis), HTN, HLD, CHF, and obesity hypoventilation syndrome   Additional history obtained:  Additional history obtained from epic chart review External records from outside source obtained and reviewed including EMS report   Lab Tests:  I Ordered, and personally interpreted labs.  The pertinent results include:  cbc nl, inr nl, cmp nl, lactic nl, ck sl elevated at 385, inr 1.1   Imaging Studies ordered:  I ordered imaging studies including cxr, pelvis, ct head, c-spine, chest/abd/pelvis  I independently visualized and interpreted imaging which showed  CXR: No active disease.  Pelvis: Negative for acute traumatic injury.  CT head/c-spine: No acute intracranial abnormality.  2. No acute displaced fracture or traumatic listhesis of the  cervical spine. Limited evaluation due to body habitus.  CT chest/abd/pelvis: New, likely acute superior endplate deformity of L1 without  significant anterior height loss.  2. Nondisplaced fractures of the right clavicular head. No  dislocation of the sternoclavicular joint. Soft tissue contusion and  fat stranding involving the right sternocleidomastoid muscle and  overlying superficial subcutaneous soft tissues.  3. Suspect nondisplaced fractures of the proximal left humerus,  incompletely imaged and suboptimally assessed.  4. No CT evidence of organ injury to the chest, abdomen, or pelvis.  5. Tracheostomy.  6. Cardiomegaly.    Aortic Atherosclerosis (ICD10-I70.0).  L femur/tib/fib/ankle/knee Acute displaced tibial plateau intercondylar fracture.  2. Limited evaluation for joint effusion/lipohemarthrosis due to  collimation of view and only frontal view of the femur.  3. Severe tricompartmental degenerative changes of the knee.  4. No other acute fracture or dislocation of the left femur, knee,  tibia fibula, ankle.    I agree with the radiologist interpretation   Cardiac Monitoring:  The patient was maintained on a cardiac  monitor.  I personally viewed and interpreted the cardiac monitored which showed an underlying rhythm of: nsr   Medicines ordered and prescription drug management:   I have reviewed the patients home medicines and have made adjustments as needed   Test Considered:  ct   Critical Interventions:  ct   Consultations Obtained:  I requested consultation with the orthopedist (Dr. Hulda Humphrey),  and discussed lab and imaging findings as well as pertinent plan - he will see pt in consult, requests a ct knee Dr. Andrey Campanile (Trauma) will see pt in consult.  He asks that medicine admit.  He requested that I call NS. Pt d/w NS Esperanza Richters) who will see pt in consult.  Nothing acute to do now.    Problem List / ED Course:  Fall with multiple fx:  pt will need admission as she is unable to ambulate due to tibial plateau fx and also has fx to right clavicle and left prox humerus.  Obesity hypoventilation syn:  pt is getting 12 L via trach collar   Reevaluation:  After the interventions noted above, I reevaluated the patient and found that they have :improved   Social Determinants of Health:  Lives alone   Dispostion:  After consideration of the diagnostic results and the patients response to treatment, I feel that the patent would benefit from admission.    CRITICAL CARE Performed by: Jacalyn Lefevre   Total critical care time: 30 minutes  Critical care time was exclusive of separately billable procedures and treating other patients.  Critical care was necessary to treat or prevent imminent or life-threatening deterioration.  Critical care was time spent personally by me on the following activities: development of treatment plan with patient and/or surrogate as well as nursing, discussions with consultants, evaluation of patient's response to treatment, examination of patient, obtaining history from patient or surrogate, ordering and performing treatments and interventions, ordering and  review of laboratory studies, ordering and review of radiographic studies, pulse oximetry and re-evaluation of patient's condition.         Final Clinical Impression(s) / ED Diagnoses Final diagnoses:  Fall, initial encounter  Closed nondisplaced fracture of right clavicle, unspecified part of clavicle, initial encounter  Closed fracture of left tibial plateau, initial encounter  Closed compression fracture of  body of L1 vertebra (HCC)  Closed fracture of proximal end of left humerus, unspecified fracture morphology, initial encounter  Tracheostomy dependence [facility])    Rx / DC Orders ED Discharge Orders     None         Jacalyn Lefevre, MD 10/23/23 0001

## 2023-10-22 NOTE — Consult Note (Addendum)
 CC: fall  Requesting provider: dr Particia Nearing  HPI: Angel Brewer is an 58 y.o. female who is here for dilation after sustaining a fall in her home around 10 AM today.  Patient had been in the bathroom since.  She was brought in by EMS.  Reportedly she was trying to sit on the toilet and fell.  She complained of left leg and right shoulder pain.  Patient is tracheostomy dependent and does not use a vent at home.  She does have a history of VTE and takes Eliquis.  No LOC.  Patient has a past medical history of chronic diastolic heart failure with preserved EF, DVT/PE, obesity hypoventilation syndrome tracheostomy dependent, obstructive sleep apnea  It is a Gair bit challenging to get history from the patient since she does not have her Passy-Muir valve in currently.  She does nod that she is having pain in her left upper arm, right shoulder and chest, left knee area.  She denies pain in her back.  She denies pain in her abdomen.  She denies pain in her right lower extremity.  She had a hospitalization in February for acute on chronic respiratory failure  Past Medical History:  Diagnosis Date   Chronic diastolic CHF (congestive heart failure) (HCC) 01/05/2022   DVT (deep vein thrombosis) in pregnancy    RLE DVT 02/2016   Dyspnea    Essential hypertension 08/25/2017   Hyperlipidemia 11/21/2021   Morbid obesity (HCC)    Obesity hypoventilation syndrome (HCC) 05/03/2021   PE (pulmonary embolism) 02/29/2016   Sleep apnea    uses a cpap   Tracheostomy present (HCC) 01/05/2022   Size 6 shiley/cuffless  Last changed: 10/9     Discussion  Stable trach.  She does get a Voight more short of breath with exertion.  Although I did not observe it on my exam she does endorse she will occasionally have a rush of air after removing PMV following activity.  This could suggest some mild air trapping from the trach.  Otherwise she is doing fantastic with the trach     Plan  Cont routin    Past  Surgical History:  Procedure Laterality Date   CESAREAN SECTION     x 3   OPEN REDUCTION INTERNAL FIXATION (ORIF) DISTAL RADIAL FRACTURE Right 06/20/2019   Procedure: OPEN REDUCTION INTERNAL FIXATION (ORIF) DISTAL RADIAL FRACTURE;  Surgeon: Mack Hook, MD;  Location: [facility] OR;  Service: Orthopedics;  Laterality: Right;  REGIONAL BLOCK TOTAL SURGERY TIME: 90 MINUTES   SYNOVECTOMY Right 11/21/2019   Procedure: RIGHT WRIST LIGAMENT RECONSTRUCTION;  Surgeon: Mack Hook, MD;  Location: [facility] OR;  Service: Orthopedics;  Laterality: Right;  PROCEDURE: RIGHT WRIST LIGAMENT RECONSTRUCTION LENGTH OF SURGERY: 105 MIN    Family History  Problem Relation Age of Onset   Hypertension Sister     Social:  reports that she has quit smoking. She has never used smokeless tobacco. She reports that she does not drink alcohol and does not use drugs.  Allergies: No Known Allergies  Medications: I have reviewed the patient's current medications.   ROS - somewhat limited given pt's inability to speak with trach in; all of the below systems have been reviewed with the patient and positives are indicated with bold text General: chills, fever or night sweats Eyes: blurry vision or double vision ENT: epistaxis or sore throat Allergy/Immunology: itchy/watery eyes or nasal congestion Hematologic/Lymphatic: bleeding problems, blood clots or swollen lymph nodes Endocrine: temperature intolerance or unexpected weight changes  Breast: new or changing breast lumps or nipple discharge Resp: cough, shortness of breath, or wheezing CV: chest pain or dyspnea on exertion GI: as per HPI GU: dysuria, trouble voiding, or hematuria MSK: joint pain or joint stiffness Neuro: TIA or stroke symptoms Derm: pruritus and skin lesion changes Psych: anxiety and depression  PE Blood pressure (!) 165/118, pulse 96, temperature 98.6 F (37 C), resp. rate (!) 25, height 5\' 6"  (1.676 m), weight (!) 178 kg, last menstrual period  11/29/2015, SpO2 100%. Constitutional: NAD; conversant; no deformities; severe obesity Eyes: Moist conjunctiva; no lid lag; anicteric; PERRL Neck: Trachea midline; no thyromegaly; trach present (no PMV) Lungs: Normal respiratory effort; no tactile fremitus CV: RRR; no palpable thrills; no pitting edema GI: Abd obese, soft, small umbilical hernia - nontender; no palpable hepatosplenomegaly MSK: no clubbing/cyanosis; soft tissue swelling/ecchymosis/ttp right clavicle; TTP Prox upper LUE; TTP L knee; some TTP right knee but no obvious deformity; no palp deformity b/l forearms Psychiatric: Appropriate affect; awake and alert Lymphatic: No palpable cervical or axillary lymphadenopathy Skin:see above; brawny skin b/l shins  Results for orders placed or performed during the hospital encounter of 10/22/23 (from the past 48 hours)  I-Stat Chem 8, ED     Status: Abnormal   Collection Time: 10/22/23  9:02 PM  Result Value Ref Range   Sodium 138 135 - 145 mmol/L   Potassium 5.7 (H) 3.5 - 5.1 mmol/L   Chloride 101 98 - 111 mmol/L   BUN 26 (H) 6 - 20 mg/dL   Creatinine, Ser 6.04 (H) 0.44 - 1.00 mg/dL   Glucose, Bld 540 (H) 70 - 99 mg/dL    Comment: Glucose reference range applies only to samples taken after fasting for at least 8 hours.   Calcium, Ion 1.01 (L) 1.15 - 1.40 mmol/L   TCO2 36 (H) 22 - 32 mmol/L   Hemoglobin 17.0 (H) 12.0 - 15.0 g/dL   HCT 98.1 (H) 19.1 - 47.8 %  I-Stat Lactic Acid, ED     Status: None   Collection Time: 10/22/23  9:02 PM  Result Value Ref Range   Lactic Acid, Venous 1.6 0.5 - 1.9 mmol/L  Comprehensive metabolic panel     Status: Abnormal   Collection Time: 10/22/23  9:08 PM  Result Value Ref Range   Sodium 138 135 - 145 mmol/L   Potassium 4.5 3.5 - 5.1 mmol/L   Chloride 97 (L) 98 - 111 mmol/L   CO2 30 22 - 32 mmol/L   Glucose, Bld 104 (H) 70 - 99 mg/dL    Comment: Glucose reference range applies only to samples taken after fasting for at least 8 hours.   BUN 17  6 - 20 mg/dL   Creatinine, Ser 2.95 0.44 - 1.00 mg/dL   Calcium 9.1 8.9 - 62.1 mg/dL   Total Protein 7.6 6.5 - 8.1 g/dL   Albumin 3.2 (L) 3.5 - 5.0 g/dL   AST 22 15 - 41 U/L   ALT 11 0 - 44 U/L   Alkaline Phosphatase 58 38 - 126 U/L   Total Bilirubin 1.7 (H) 0.0 - 1.2 mg/dL   GFR, Estimated >30 >86 mL/min    Comment: (NOTE) Calculated using the CKD-EPI Creatinine Equation (2021)    Anion gap 11 5 - 15    Comment: Performed at [facility], [address], [place], [place] [address]  CBC     Status: Abnormal   Collection Time: 10/22/23  9:08 PM  Result Value Ref  Range   WBC 9.8 4.0 - 10.5 K/uL   RBC 4.59 3.87 - 5.11 MIL/uL   Hemoglobin 13.4 12.0 - 15.0 g/dL   HCT 40.9 81.1 - 91.4 %   MCV 97.4 80.0 - 100.0 fL   MCH 29.2 26.0 - 34.0 pg   MCHC 30.0 30.0 - 36.0 g/dL   RDW 78.2 (H) 95.6 - 21.3 %   Platelets 236 150 - 400 K/uL   nRBC 1.6 (H) 0.0 - 0.2 %    Comment: Performed at [facility], [address], [place], [place] [address]  Ethanol     Status: None   Collection Time: 10/22/23  9:08 PM  Result Value Ref Range   Alcohol, Ethyl (B) <10 <10 mg/dL    Comment: (NOTE) Lowest detectable limit for serum alcohol is 10 mg/dL.  For medical purposes only. Performed at [facility], [address], [place], [place] [address]   Protime-INR     Status: None   Collection Time: 10/22/23  9:08 PM  Result Value Ref Range   Prothrombin Time 14.7 11.4 - 15.2 seconds   INR 1.1 0.8 - 1.2    Comment: (NOTE) INR goal varies based on device and disease states. Performed at [facility], [address] [place], [place] [address]   CK     Status: Abnormal   Collection Time: 10/22/23  9:08 PM  Result Value Ref Range   Total CK 385 (H) 38 - 234 U/L    Comment: Performed at [facility], [address], [place], [place] [address]    DG Femur 1 View Left Result Date: 10/22/2023 CLINICAL DATA:  [id number] Fall 190176; pain EXAM: PORTABLE LEFT KNEE - 1-2 VIEW;  LEFT TIBIA AND FIBULA - 2 VIEW; LEFT FEMUR 1 VIEW; PORTABLE LEFT ANKLE - 2 VIEW COMPARISON:  None Available. FINDINGS: Acute displaced tibial plateau intercondylar fracture. No dislocation. Limited evaluation for joint effusion/lipohemarthrosis due to collimation of view and only frontal view of the femur. Severe tricompartmental degenerative changes of the knee. Soft tissues are unremarkable. IMPRESSION: 1. Acute displaced tibial plateau intercondylar fracture. 2. Limited evaluation for joint effusion/lipohemarthrosis due to collimation of view and only frontal view of the femur. 3. Severe tricompartmental degenerative changes of the knee. 4. No other acute fracture or dislocation of the left femur, knee, tibia fibula, ankle. Electronically Signed   By: Tish Frederickson M.D.   On: 10/22/2023 23:13   DG Tibia/Fibula Left Result Date: 10/22/2023 CLINICAL DATA:  [id number] Fall 190176; pain EXAM: PORTABLE LEFT KNEE - 1-2 VIEW; LEFT TIBIA AND FIBULA - 2 VIEW; LEFT FEMUR 1 VIEW; PORTABLE LEFT ANKLE - 2 VIEW COMPARISON:  None Available. FINDINGS: Acute displaced tibial plateau intercondylar fracture. No dislocation. Limited evaluation for joint effusion/lipohemarthrosis due to collimation of view and only frontal view of the femur. Severe tricompartmental degenerative changes of the knee. Soft tissues are unremarkable. IMPRESSION: 1. Acute displaced tibial plateau intercondylar fracture. 2. Limited evaluation for joint effusion/lipohemarthrosis due to collimation of view and only frontal view of the femur. 3. Severe tricompartmental degenerative changes of the knee. 4. No other acute fracture or dislocation of the left femur, knee, tibia fibula, ankle. Electronically Signed   By: Tish Frederickson M.D.   On: 10/22/2023 23:13   DG Ankle Left Port Result Date: 10/22/2023 CLINICAL DATA:  [id number] Fall 190176; pain EXAM: PORTABLE LEFT KNEE - 1-2 VIEW; LEFT TIBIA AND FIBULA - 2 VIEW; LEFT FEMUR 1 VIEW; PORTABLE  LEFT ANKLE - 2  VIEW COMPARISON:  None Available. FINDINGS: Acute displaced tibial plateau intercondylar fracture. No dislocation. Limited evaluation for joint effusion/lipohemarthrosis due to collimation of view and only frontal view of the femur. Severe tricompartmental degenerative changes of the knee. Soft tissues are unremarkable. IMPRESSION: 1. Acute displaced tibial plateau intercondylar fracture. 2. Limited evaluation for joint effusion/lipohemarthrosis due to collimation of view and only frontal view of the femur. 3. Severe tricompartmental degenerative changes of the knee. 4. No other acute fracture or dislocation of the left femur, knee, tibia fibula, ankle. Electronically Signed   By: Tish Frederickson M.D.   On: 10/22/2023 23:13   DG Knee Left Port Result Date: 10/22/2023 CLINICAL DATA:  [id number] Fall 190176; pain EXAM: PORTABLE LEFT KNEE - 1-2 VIEW; LEFT TIBIA AND FIBULA - 2 VIEW; LEFT FEMUR 1 VIEW; PORTABLE LEFT ANKLE - 2 VIEW COMPARISON:  None Available. FINDINGS: Acute displaced tibial plateau intercondylar fracture. No dislocation. Limited evaluation for joint effusion/lipohemarthrosis due to collimation of view and only frontal view of the femur. Severe tricompartmental degenerative changes of the knee. Soft tissues are unremarkable. IMPRESSION: 1. Acute displaced tibial plateau intercondylar fracture. 2. Limited evaluation for joint effusion/lipohemarthrosis due to collimation of view and only frontal view of the femur. 3. Severe tricompartmental degenerative changes of the knee. 4. No other acute fracture or dislocation of the left femur, knee, tibia fibula, ankle. Electronically Signed   By: Tish Frederickson M.D.   On: 10/22/2023 23:13   DG Pelvis Portable Result Date: 10/22/2023 CLINICAL DATA:  Trauma EXAM: PORTABLE PELVIS 1-2 VIEWS COMPARISON:  CT abdomen pelvis 10/22/2023 FINDINGS: Limited evaluation due to overlapping osseous structures and overlying soft tissues. There is no evidence of pelvic fracture  or diastasis. No acute displaced fracture or dislocation of either hips on frontal view. No pelvic bone lesions are seen. IMPRESSION: Negative for acute traumatic injury. Electronically Signed   By: Tish Frederickson M.D.   On: 10/22/2023 22:58   DG Chest Port 1 View Result Date: 10/22/2023 CLINICAL DATA:  Trauma fall. EXAM: PORTABLE CHEST 1 VIEW COMPARISON:  CT chest 09/02/2023 FINDINGS: Tracheostomy tube terminates 8 cm above the carina. Cardiomegaly.  The heart and mediastinal contours are unchanged. No focal consolidation. No pulmonary edema. No pleural effusion. No pneumothorax. No acute osseous abnormality. IMPRESSION: No active disease. Electronically Signed   By: Tish Frederickson M.D.   On: 10/22/2023 22:56   CT HEAD WO CONTRAST Result Date: 10/22/2023 CLINICAL DATA:  Head trauma, moderate-severe; Polytrauma, blunt EXAM: CT HEAD WITHOUT CONTRAST CT CERVICAL SPINE WITHOUT CONTRAST TECHNIQUE: Multidetector CT imaging of the head and cervical spine was performed following the standard protocol without intravenous contrast. Multiplanar CT image reconstructions of the cervical spine were also generated. RADIATION DOSE REDUCTION: This exam was performed according to the departmental dose-optimization program which includes automated exposure control, adjustment of the mA and/or kV according to patient size and/or use of iterative reconstruction technique. COMPARISON:  CT head 08/19/2020 FINDINGS: CT HEAD FINDINGS Brain: No evidence of large-territorial acute infarction. No parenchymal hemorrhage. No mass lesion. No extra-axial collection. No mass effect or midline shift. No hydrocephalus. Basilar cisterns are patent. Vascular: No hyperdense vessel. Skull: No acute fracture or focal lesion. Sinuses/Orbits: Paranasal sinuses and mastoid air cells are clear. The orbits are unremarkable. Other: None. CT CERVICAL SPINE FINDINGS Alignment: Normal. Skull base and vertebrae: Limited evaluation due to quantum mottle  artifact. No acute fracture. No aggressive appearing focal osseous lesion or focal pathologic process. Soft tissues  and spinal canal: No prevertebral fluid or swelling. No visible canal hematoma. Upper chest: Unremarkable. Other: Tracheostomy tube tip 7 cm above the carina. IMPRESSION: 1. No acute intracranial abnormality. 2. No acute displaced fracture or traumatic listhesis of the cervical spine. Limited evaluation due to body habitus. Electronically Signed   By: Tish Frederickson M.D.   On: 10/22/2023 22:55   CT CERVICAL SPINE WO CONTRAST Result Date: 10/22/2023 CLINICAL DATA:  Head trauma, moderate-severe; Polytrauma, blunt EXAM: CT HEAD WITHOUT CONTRAST CT CERVICAL SPINE WITHOUT CONTRAST TECHNIQUE: Multidetector CT imaging of the head and cervical spine was performed following the standard protocol without intravenous contrast. Multiplanar CT image reconstructions of the cervical spine were also generated. RADIATION DOSE REDUCTION: This exam was performed according to the departmental dose-optimization program which includes automated exposure control, adjustment of the mA and/or kV according to patient size and/or use of iterative reconstruction technique. COMPARISON:  CT head 08/19/2020 FINDINGS: CT HEAD FINDINGS Brain: No evidence of large-territorial acute infarction. No parenchymal hemorrhage. No mass lesion. No extra-axial collection. No mass effect or midline shift. No hydrocephalus. Basilar cisterns are patent. Vascular: No hyperdense vessel. Skull: No acute fracture or focal lesion. Sinuses/Orbits: Paranasal sinuses and mastoid air cells are clear. The orbits are unremarkable. Other: None. CT CERVICAL SPINE FINDINGS Alignment: Normal. Skull base and vertebrae: Limited evaluation due to quantum mottle artifact. No acute fracture. No aggressive appearing focal osseous lesion or focal pathologic process. Soft tissues and spinal canal: No prevertebral fluid or swelling. No visible canal hematoma. Upper  chest: Unremarkable. Other: Tracheostomy tube tip 7 cm above the carina. IMPRESSION: 1. No acute intracranial abnormality. 2. No acute displaced fracture or traumatic listhesis of the cervical spine. Limited evaluation due to body habitus. Electronically Signed   By: Tish Frederickson M.D.   On: 10/22/2023 22:55   CT CHEST ABDOMEN PELVIS W CONTRAST Result Date: 10/22/2023 CLINICAL DATA:  Trauma, fall on blood thinners EXAM: CT CHEST, ABDOMEN, AND PELVIS WITH CONTRAST TECHNIQUE: Multidetector CT imaging of the chest, abdomen and pelvis was performed following the standard protocol during bolus administration of intravenous contrast. RADIATION DOSE REDUCTION: This exam was performed according to the departmental dose-optimization program which includes automated exposure control, adjustment of the mA and/or kV according to patient size and/or use of iterative reconstruction technique. CONTRAST:  65mL OMNIPAQUE IOHEXOL 350 MG/ML SOLN COMPARISON:  CT chest angiogram, 09/02/2023 FINDINGS: CT CHEST FINDINGS Cardiovascular: Aortic atherosclerosis. Cardiomegaly. No pericardial effusion. Mediastinum/Nodes: No enlarged mediastinal, hilar, or axillary lymph nodes. Thyroid gland, trachea, and esophagus demonstrate no significant findings. Lungs/Pleura: Tracheostomy. Mild bibasilar scarring or atelectasis and diffuse mosaic attenuation of the airspaces, suggesting small airways disease. No pleural effusion or pneumothorax. Musculoskeletal: Soft tissue contusion and fat stranding involving the right sternocleidomastoid muscle and overlying superficial subcutaneous soft tissues (series 3, image 6). Nondisplaced fractures of the right clavicular head (series 6, image 74). No dislocation of the sternoclavicular joint. Suspect nondisplaced fractures of the proximal left humerus, incompletely imaged and suboptimally assessed (series 6, image 65). CT ABDOMEN PELVIS FINDINGS Hepatobiliary: Hepatomegaly, maximum coronal span 20.5 cm. No  solid liver abnormality is seen. No gallstones, gallbladder wall thickening, or biliary dilatation. Pancreas: Unremarkable. No pancreatic ductal dilatation or surrounding inflammatory changes. Spleen: Normal in size without significant abnormality. Adrenals/Urinary Tract: Unchanged, definitively benign right adrenal adenoma, requiring no further follow-up or characterization. Kidneys are normal, without renal calculi, solid lesion, or hydronephrosis. Bladder is unremarkable. Stomach/Bowel: Stomach is within normal limits. Appendix appears normal. No evidence of bowel  wall thickening, distention, or inflammatory changes. Vascular/Lymphatic: Multiple small left ovarian vein phleboliths, which do not reflect urinary tract calculi. No enlarged abdominal or pelvic lymph nodes. Reproductive: No mass or other abnormality. Other: Small, fat containing umbilical hernia.  No ascites. Musculoskeletal: New, likely acute superior endplate deformity of L1 without significant anterior height loss (series 7, image 64). Disc degenerative disease and bridging osteophytosis throughout the thoracic spine. IMPRESSION: 1. New, likely acute superior endplate deformity of L1 without significant anterior height loss. 2. Nondisplaced fractures of the right clavicular head. No dislocation of the sternoclavicular joint. Soft tissue contusion and fat stranding involving the right sternocleidomastoid muscle and overlying superficial subcutaneous soft tissues. 3. Suspect nondisplaced fractures of the proximal left humerus, incompletely imaged and suboptimally assessed. 4. No CT evidence of organ injury to the chest, abdomen, or pelvis. 5. Tracheostomy. 6. Cardiomegaly. Aortic Atherosclerosis (ICD10-I70.0). Electronically Signed   By: Jearld Lesch M.D.   On: 10/22/2023 22:10    Imaging: Personally reviewed  A/P: Sendy Monique Muldrow is an 58 y.o. female  S/p fall Superior endplate fracture of L1 Right clavicle fracture with  surrounding soft tissue ending Probable left humerus fracture Left tibial plateau fracture Severe obesity OSA/obesity hypoventilation syndrome Tracheostomy dependent Chronic diastolic heart failure with preserved ejection fraction History of VTE on Eliquis   Admit medicine Trauma surgery will see again in AM ED MD consulted ortho - dr Hulda Humphrey  ED MD to also consult NSG given sup end plate fx F/u diagnostic xrays (L humerus, b/l shoulder) F/u nsg/ortho rec Pulm/resp mgmt per medicine Scds Will need chemical vte prophlaxis for sure given fxs and h/o PE;  if requires surgery for ortho injuries -may need to pharm recs for h/o VTE rec and pt high risk for recurrent vte given obesity; h/o PE; ortho fxs Likely sling to start with for clavicle fx - f/u ortho official rec  Data reviewed: Reviewed hospital discharge summary from September 11, 2023, ED notes from today, labs from today, vital signs since the arrival to the ER, multiple diagnostic x-rays of multiple extremities from today, CT head, C-spine, chest abdomen pelvis; echocardiogram from 2025  High MDM  Mary Sella. Andrey Campanile, MD, FACS General, Bariatric, & Minimally Invasive Surgery [facility]

## 2023-10-22 NOTE — ED Triage Notes (Signed)
 Pt BIB EMS from home with c/o fall at 1000 today, has been on the floor in the bathroom since. Was trying to sit on the toilet and fell, has c/o left leg and right shoulder. Pt has a trach, 60% on RA. Placed on 15lpm NRB, O2 increased to 98%. Denies LOC, does take eliquis, states that she did not hit her head.   20LFA  143/70 80HR Cbg 113

## 2023-10-22 NOTE — Progress Notes (Signed)
   10/22/23 2022  Spiritual Encounters  Type of Visit Initial  Care provided to: Family;Pt and family  Referral source Trauma page  Reason for visit Trauma  OnCall Visit No   Responded to level 2 trauma, patient sleeping at the time of visit, provided spiritual care to niece and nephew at bedside.

## 2023-10-22 NOTE — ED Notes (Signed)
 Pt's O2 dropping when she falls asleep. Family at bedside states that she wears a cpap at night. RT called to assess.

## 2023-10-22 NOTE — Progress Notes (Signed)
 Orthopedic Tech Progress Note Patient Details:  Angel Brewer 23-Mar-1966 [id number]  Patient ID: [name], female   DOB: [birth year], 58 y.o.   MRN: [id number] Checked in for Level 2 trauma.  Blase Mess 10/22/2023, 8:53 PM

## 2023-10-23 ENCOUNTER — Encounter (HOSPITAL_COMMUNITY): Payer: Self-pay | Admitting: Internal Medicine

## 2023-10-23 ENCOUNTER — Inpatient Hospital Stay (HOSPITAL_COMMUNITY): Payer: MEDICAID

## 2023-10-23 DIAGNOSIS — J398 Other specified diseases of upper respiratory tract: Secondary | ICD-10-CM | POA: Diagnosis present

## 2023-10-23 DIAGNOSIS — M1712 Unilateral primary osteoarthritis, left knee: Secondary | ICD-10-CM | POA: Diagnosis present

## 2023-10-23 DIAGNOSIS — E66813 Obesity, class 3: Secondary | ICD-10-CM | POA: Diagnosis present

## 2023-10-23 DIAGNOSIS — S42202A Unspecified fracture of upper end of left humerus, initial encounter for closed fracture: Secondary | ICD-10-CM | POA: Diagnosis present

## 2023-10-23 DIAGNOSIS — Y92009 Unspecified place in unspecified non-institutional (private) residence as the place of occurrence of the external cause: Secondary | ICD-10-CM

## 2023-10-23 DIAGNOSIS — Y92012 Bathroom of single-family (private) house as the place of occurrence of the external cause: Secondary | ICD-10-CM | POA: Diagnosis not present

## 2023-10-23 DIAGNOSIS — J9692 Respiratory failure, unspecified with hypercapnia: Secondary | ICD-10-CM | POA: Diagnosis not present

## 2023-10-23 DIAGNOSIS — J9621 Acute and chronic respiratory failure with hypoxia: Secondary | ICD-10-CM | POA: Diagnosis present

## 2023-10-23 DIAGNOSIS — E785 Hyperlipidemia, unspecified: Secondary | ICD-10-CM | POA: Diagnosis present

## 2023-10-23 DIAGNOSIS — S42001A Fracture of unspecified part of right clavicle, initial encounter for closed fracture: Secondary | ICD-10-CM

## 2023-10-23 DIAGNOSIS — J9611 Chronic respiratory failure with hypoxia: Secondary | ICD-10-CM

## 2023-10-23 DIAGNOSIS — S82142A Displaced bicondylar fracture of left tibia, initial encounter for closed fracture: Secondary | ICD-10-CM

## 2023-10-23 DIAGNOSIS — Z930 Tracheostomy status: Secondary | ICD-10-CM | POA: Diagnosis not present

## 2023-10-23 DIAGNOSIS — J9601 Acute respiratory failure with hypoxia: Secondary | ICD-10-CM

## 2023-10-23 DIAGNOSIS — Z6841 Body Mass Index (BMI) 40.0 and over, adult: Secondary | ICD-10-CM | POA: Diagnosis not present

## 2023-10-23 DIAGNOSIS — G9341 Metabolic encephalopathy: Secondary | ICD-10-CM | POA: Diagnosis present

## 2023-10-23 DIAGNOSIS — I5032 Chronic diastolic (congestive) heart failure: Secondary | ICD-10-CM | POA: Diagnosis present

## 2023-10-23 DIAGNOSIS — G4733 Obstructive sleep apnea (adult) (pediatric): Secondary | ICD-10-CM | POA: Diagnosis not present

## 2023-10-23 DIAGNOSIS — E662 Morbid (severe) obesity with alveolar hypoventilation: Secondary | ICD-10-CM | POA: Diagnosis present

## 2023-10-23 DIAGNOSIS — Z7901 Long term (current) use of anticoagulants: Secondary | ICD-10-CM | POA: Diagnosis not present

## 2023-10-23 DIAGNOSIS — J9602 Acute respiratory failure with hypercapnia: Secondary | ICD-10-CM | POA: Diagnosis present

## 2023-10-23 DIAGNOSIS — Z9911 Dependence on respirator [ventilator] status: Secondary | ICD-10-CM | POA: Diagnosis not present

## 2023-10-23 DIAGNOSIS — I50810 Right heart failure, unspecified: Secondary | ICD-10-CM | POA: Diagnosis present

## 2023-10-23 DIAGNOSIS — I10 Essential (primary) hypertension: Secondary | ICD-10-CM

## 2023-10-23 DIAGNOSIS — T07XXXA Unspecified multiple injuries, initial encounter: Secondary | ICD-10-CM

## 2023-10-23 DIAGNOSIS — R748 Abnormal levels of other serum enzymes: Secondary | ICD-10-CM | POA: Diagnosis present

## 2023-10-23 DIAGNOSIS — J9622 Acute and chronic respiratory failure with hypercapnia: Secondary | ICD-10-CM | POA: Diagnosis present

## 2023-10-23 DIAGNOSIS — I82442 Acute embolism and thrombosis of left tibial vein: Secondary | ICD-10-CM | POA: Diagnosis present

## 2023-10-23 DIAGNOSIS — Z86718 Personal history of other venous thrombosis and embolism: Secondary | ICD-10-CM

## 2023-10-23 DIAGNOSIS — W19XXXA Unspecified fall, initial encounter: Secondary | ICD-10-CM | POA: Diagnosis not present

## 2023-10-23 DIAGNOSIS — I2782 Chronic pulmonary embolism: Secondary | ICD-10-CM | POA: Diagnosis present

## 2023-10-23 DIAGNOSIS — S32019A Unspecified fracture of first lumbar vertebra, initial encounter for closed fracture: Secondary | ICD-10-CM

## 2023-10-23 DIAGNOSIS — Z8679 Personal history of other diseases of the circulatory system: Secondary | ICD-10-CM

## 2023-10-23 DIAGNOSIS — S42017A Nondisplaced fracture of sternal end of right clavicle, initial encounter for closed fracture: Secondary | ICD-10-CM | POA: Diagnosis present

## 2023-10-23 DIAGNOSIS — E876 Hypokalemia: Secondary | ICD-10-CM | POA: Diagnosis not present

## 2023-10-23 DIAGNOSIS — I110 Hypertensive heart disease with heart failure: Secondary | ICD-10-CM | POA: Diagnosis present

## 2023-10-23 DIAGNOSIS — Z9981 Dependence on supplemental oxygen: Secondary | ICD-10-CM | POA: Diagnosis not present

## 2023-10-23 DIAGNOSIS — J9691 Respiratory failure, unspecified with hypoxia: Secondary | ICD-10-CM

## 2023-10-23 DIAGNOSIS — W1811XA Fall from or off toilet without subsequent striking against object, initial encounter: Secondary | ICD-10-CM | POA: Diagnosis present

## 2023-10-23 DIAGNOSIS — R531 Weakness: Secondary | ICD-10-CM | POA: Diagnosis present

## 2023-10-23 DIAGNOSIS — Z86711 Personal history of pulmonary embolism: Secondary | ICD-10-CM

## 2023-10-23 LAB — I-STAT VENOUS BLOOD GAS, ED
Acid-Base Excess: 6 mmol/L — ABNORMAL HIGH (ref 0.0–2.0)
Bicarbonate: 37.8 mmol/L — ABNORMAL HIGH (ref 20.0–28.0)
Calcium, Ion: 1.23 mmol/L (ref 1.15–1.40)
HCT: 45 % (ref 36.0–46.0)
Hemoglobin: 15.3 g/dL — ABNORMAL HIGH (ref 12.0–15.0)
O2 Saturation: 74 %
Patient temperature: 37.1
Potassium: 4.5 mmol/L (ref 3.5–5.1)
Sodium: 140 mmol/L (ref 135–145)
TCO2: 41 mmol/L — ABNORMAL HIGH (ref 22–32)
pCO2, Ven: 93.7 mmHg (ref 44–60)
pH, Ven: 7.215 — ABNORMAL LOW (ref 7.25–7.43)
pO2, Ven: 50 mmHg — ABNORMAL HIGH (ref 32–45)

## 2023-10-23 LAB — COMPREHENSIVE METABOLIC PANEL WITH GFR
ALT: 11 U/L (ref 0–44)
AST: 23 U/L (ref 15–41)
Albumin: 2.9 g/dL — ABNORMAL LOW (ref 3.5–5.0)
Alkaline Phosphatase: 52 U/L (ref 38–126)
Anion gap: 8 (ref 5–15)
BUN: 17 mg/dL (ref 6–20)
CO2: 33 mmol/L — ABNORMAL HIGH (ref 22–32)
Calcium: 8.7 mg/dL — ABNORMAL LOW (ref 8.9–10.3)
Chloride: 99 mmol/L (ref 98–111)
Creatinine, Ser: 0.98 mg/dL (ref 0.44–1.00)
GFR, Estimated: >60 mL/min (ref >60)
Glucose, Bld: 101 mg/dL — ABNORMAL HIGH (ref 70–99)
Potassium: 4.6 mmol/L (ref 3.5–5.1)
Sodium: 140 mmol/L (ref 135–145)
Total Bilirubin: 1.5 mg/dL — ABNORMAL HIGH (ref 0.0–1.2)
Total Protein: 6.6 g/dL (ref 6.5–8.1)

## 2023-10-23 LAB — I-STAT ARTERIAL BLOOD GAS, ED
Acid-Base Excess: 8 mmol/L — ABNORMAL HIGH (ref 0.0–2.0)
Bicarbonate: 35.7 mmol/L — ABNORMAL HIGH (ref 20.0–28.0)
Calcium, Ion: 1.21 mmol/L (ref 1.15–1.40)
HCT: 41 % (ref 36.0–46.0)
Hemoglobin: 13.9 g/dL (ref 12.0–15.0)
O2 Saturation: 89 %
Potassium: 4.4 mmol/L (ref 3.5–5.1)
Sodium: 140 mmol/L (ref 135–145)
TCO2: 38 mmol/L — ABNORMAL HIGH (ref 22–32)
pCO2 arterial: 64.8 mmHg — ABNORMAL HIGH (ref 32–48)
pH, Arterial: 7.349 — ABNORMAL LOW (ref 7.35–7.45)
pO2, Arterial: 63 mmHg — ABNORMAL LOW (ref 83–108)

## 2023-10-23 LAB — GLUCOSE, CAPILLARY
Glucose-Capillary: 90 mg/dL (ref 70–99)
Glucose-Capillary: 116 mg/dL — ABNORMAL HIGH (ref 70–99)
Glucose-Capillary: 75 mg/dL (ref 70–99)
Glucose-Capillary: 81 mg/dL (ref 70–99)

## 2023-10-23 LAB — CBC
HCT: 42.2 % (ref 36.0–46.0)
Hemoglobin: 12.5 g/dL (ref 12.0–15.0)
MCH: 29.1 pg (ref 26.0–34.0)
MCHC: 29.6 g/dL — ABNORMAL LOW (ref 30.0–36.0)
MCV: 98.1 fL (ref 80.0–100.0)
Platelets: 205 10*3/uL (ref 150–400)
RBC: 4.3 MIL/uL (ref 3.87–5.11)
RDW: 16.3 % — ABNORMAL HIGH (ref 11.5–15.5)
WBC: 8.5 10*3/uL (ref 4.0–10.5)
nRBC: 1.4 % — ABNORMAL HIGH (ref 0.0–0.2)

## 2023-10-23 LAB — MRSA NEXT GEN BY PCR, NASAL: MRSA by PCR Next Gen: NOT DETECTED

## 2023-10-23 LAB — PROCALCITONIN: Procalcitonin: <0.1 ng/mL

## 2023-10-23 LAB — APTT
aPTT: 45 s — ABNORMAL HIGH (ref 24–36)
aPTT: 61 s — ABNORMAL HIGH (ref 24–36)

## 2023-10-23 LAB — HEPARIN LEVEL (UNFRACTIONATED)
Heparin Unfractionated: 0.58 [IU]/mL (ref 0.30–0.70)
Heparin Unfractionated: 0.66 [IU]/mL (ref 0.30–0.70)

## 2023-10-23 MED ORDER — DOCUSATE SODIUM 100 MG PO CAPS: 100.0000 mg | ORAL_CAPSULE | Freq: Two times a day (BID) | ORAL | Status: DC | PRN

## 2023-10-23 MED ORDER — SODIUM CHLORIDE 0.9 % IV SOLN: 250.0000 mL | INTRAVENOUS | Status: AC | PRN

## 2023-10-23 MED ORDER — IPRATROPIUM-ALBUTEROL 0.5-2.5 (3) MG/3ML IN SOLN: 3.0000 mL | RESPIRATORY_TRACT | Status: DC | PRN

## 2023-10-23 MED ORDER — SODIUM CHLORIDE 0.9% FLUSH
3.0000 mL | Freq: Two times a day (BID) | INTRAVENOUS | Status: DC
  Administered 2023-10-23 – 2023-11-26 (×62): 3 mL via INTRAVENOUS

## 2023-10-23 MED ORDER — ONDANSETRON HCL 4 MG/2ML IJ SOLN
4.0000 mg | Freq: Four times a day (QID) | INTRAMUSCULAR | Status: DC | PRN
  Administered 2023-10-24: 4 mg via INTRAVENOUS
  Filled 2023-10-23: qty 2

## 2023-10-23 MED ORDER — POLYETHYLENE GLYCOL 3350 17 G PO PACK: 17.0000 g | PACK | Freq: Every day | ORAL | Status: DC | PRN

## 2023-10-23 MED ORDER — ACETAMINOPHEN 10 MG/ML IV SOLN
1000.0000 mg | Freq: Three times a day (TID) | INTRAVENOUS | Status: AC
  Administered 2023-10-23 (×2): 1000 mg via INTRAVENOUS
  Filled 2023-10-23 (×3): qty 100

## 2023-10-23 MED ORDER — AMLODIPINE BESYLATE 5 MG PO TABS: 5.0000 mg | ORAL_TABLET | Freq: Every day | ORAL | Status: DC

## 2023-10-23 MED ORDER — PANTOPRAZOLE SODIUM 40 MG IV SOLR
40.0000 mg | Freq: Every day | INTRAVENOUS | Status: DC
  Administered 2023-10-23 – 2023-10-25 (×3): 40 mg via INTRAVENOUS
  Filled 2023-10-23 (×3): qty 10

## 2023-10-23 MED ORDER — ATORVASTATIN CALCIUM 10 MG PO TABS: 20.0000 mg | ORAL_TABLET | Freq: Every day | ORAL | Status: DC

## 2023-10-23 MED ORDER — FUROSEMIDE 10 MG/ML IJ SOLN
40.0000 mg | INTRAMUSCULAR | Status: AC
  Administered 2023-10-23: 40 mg via INTRAVENOUS
  Filled 2023-10-23: qty 4

## 2023-10-23 MED ORDER — DAPAGLIFLOZIN PROPANEDIOL 10 MG PO TABS: 10.0000 mg | ORAL_TABLET | Freq: Every day | ORAL | Status: DC

## 2023-10-23 MED ORDER — FUROSEMIDE 20 MG PO TABS: 20.0000 mg | ORAL_TABLET | Freq: Every day | ORAL | Status: DC

## 2023-10-23 MED ORDER — ONDANSETRON HCL 4 MG PO TABS: 4.0000 mg | ORAL_TABLET | Freq: Four times a day (QID) | ORAL | Status: DC | PRN

## 2023-10-23 MED ORDER — LACTATED RINGERS IV SOLN: INTRAVENOUS | Status: DC

## 2023-10-23 MED ORDER — SODIUM CHLORIDE 0.9% FLUSH
3.0000 mL | INTRAVENOUS | Status: DC | PRN
  Administered 2023-10-25: 3 mL via INTRAVENOUS

## 2023-10-23 MED ORDER — HYDRALAZINE HCL 20 MG/ML IJ SOLN: 10.0000 mg | Freq: Four times a day (QID) | INTRAMUSCULAR | Status: DC | PRN

## 2023-10-23 MED ORDER — SPIRONOLACTONE 12.5 MG HALF TABLET: 25.0000 mg | ORAL_TABLET | Freq: Every day | ORAL | Status: DC

## 2023-10-23 MED ORDER — CHLORHEXIDINE GLUCONATE CLOTH 2 % EX PADS
6.0000 | MEDICATED_PAD | Freq: Every day | CUTANEOUS | Status: DC
  Administered 2023-10-23 – 2023-11-26 (×36): 6 via TOPICAL

## 2023-10-23 MED ORDER — FUROSEMIDE 10 MG/ML IJ SOLN: 20.0000 mg | INTRAMUSCULAR | Status: DC

## 2023-10-23 MED ORDER — HEPARIN (PORCINE) 25000 UT/250ML-% IV SOLN
2150.0000 [IU]/h | INTRAVENOUS | Status: AC
  Administered 2023-10-23: 1700 [IU]/h via INTRAVENOUS
  Administered 2023-10-23: 1900 [IU]/h via INTRAVENOUS
  Administered 2023-10-24: 2000 [IU]/h via INTRAVENOUS
  Filled 2023-10-23 (×3): qty 250

## 2023-10-23 MED ORDER — INSULIN ASPART 100 UNIT/ML IJ SOLN: 0.0000 [IU] | INTRAMUSCULAR | Status: DC

## 2023-10-23 NOTE — Progress Notes (Signed)
 PHARMACY - ANTICOAGULATION CONSULT NOTE  Pharmacy Consult for heparin Indication:  h/o VTE  No Known Allergies  Patient Measurements: Height: 5\' 6"  (167.6 cm) Weight: (!) 178 kg (392 lb 6.7 oz) IBW/kg (Calculated) : 59.3 HEPARIN DW (KG): 105.3  Vital Signs: Temp: 98.3 F (36.8 C) (04/11 0700) Temp Source: Oral (04/11 0419) BP: 119/64 (04/11 0800) Pulse Rate: 69 (04/11 0853)  Labs: Recent Labs    10/22/23 2102 10/22/23 2108 10/23/23 0207 10/23/23 0408 10/23/23 0519 10/23/23 1018  HGB 17.0* 13.4 15.3* 12.5 13.9  --   HCT 50.0* 44.7 45.0 42.2 41.0  --   PLT  --  236  --  205  --   --   APTT  --   --   --   --   --  45*  LABPROT  --  14.7  --   --   --   --   INR  --  1.1  --   --   --   --   HEPARINUNFRC  --   --   --   --   --  0.58  CREATININE 1.10* 0.96  --  0.98  --   --   CKTOTAL  --  385*  --   --   --   --     Estimated Creatinine Clearance: 105.5 mL/min (by C-G formula based on SCr of 0.98 mg/dL).   Medical History: Past Medical History:  Diagnosis Date   Chronic diastolic CHF (congestive heart failure) (HCC) 01/05/2022   DVT (deep vein thrombosis) in pregnancy    RLE DVT 02/2016   Dyspnea    Essential hypertension 08/25/2017   Hyperlipidemia 11/21/2021   Morbid obesity (HCC)    Obesity hypoventilation syndrome (HCC) 05/03/2021   PE (pulmonary embolism) 02/29/2016   Sleep apnea    uses a cpap   Tracheostomy present (HCC) 01/05/2022   Size 6 shiley/cuffless  Last changed: 10/9     Discussion  Stable trach.  She does get a [place] more short of breath with exertion.  Although I did not observe it on my exam she does endorse she will occasionally have a rush of air after removing PMV following activity.  This could suggest some mild air trapping from the trach.  Otherwise she is doing fantastic with the trach     Plan  Cont routin    Assessment: 58yo female admitted for multiple fractures after sustaining a fall, to transition from apixaban (last dose  4/10 8a) to heparin; admitting MD would like to keep goal at lower end.  aPTT subtherapeutic at 45 while on heparin 1700u/hr. Anti-Xa falsely elevated in the setting of recent DOAC intake at 0.58. HgB 13.9 and PLTs 205. No issues noted w/ infusion.   Goal of Therapy:  Heparin level 0.3-0.5 units/ml aPTT 66-85 seconds Monitor platelets by anticoagulation protocol: Yes   Plan:  No bolus w/ recent trauma  Increase heparin to 1900u/hr.  Monitor heparin levels, aPTT (while DOAC affects anti-Xa assay), and CBC.  Estill Batten, PharmD, BCCCP  10/23/2023,11:21 AM

## 2023-10-23 NOTE — Progress Notes (Signed)
 eLink Physician-Brief Progress Note Patient Name: Angel Brewer DOB: [birth year] MRN: [id number]   Date of Service  10/23/2023  HPI/Events of Note  Patient assessed by speech therapy earlier today without addressing oral intake status.  eICU Interventions  NPO pending follow up ST evaluation.        Migdalia Dk 10/23/2023, 9:32 PM

## 2023-10-23 NOTE — Progress Notes (Signed)
 PHARMACY - ANTICOAGULATION CONSULT NOTE  Pharmacy Consult for heparin Indication:  h/o VTE  No Known Allergies  Patient Measurements: Height: 5\' 6"  (167.6 cm) Weight: (!) 178 kg (392 lb 6.7 oz) IBW/kg (Calculated) : 59.3 HEPARIN DW (KG): 105.3  Vital Signs: Temp: 98.8 F (37.1 C) (04/11 0031) Temp Source: Oral (04/11 0031) BP: 127/79 (04/11 0100) Pulse Rate: 87 (04/11 0100)  Labs: Recent Labs    10/22/23 2102 10/22/23 2108  HGB 17.0* 13.4  HCT 50.0* 44.7  PLT  --  236  LABPROT  --  14.7  INR  --  1.1  CREATININE 1.10* 0.96  CKTOTAL  --  385*    Estimated Creatinine Clearance: 107.7 mL/min (by C-G formula based on SCr of 0.96 mg/dL).   Medical History: Past Medical History:  Diagnosis Date   Chronic diastolic CHF (congestive heart failure) (HCC) 01/05/2022   DVT (deep vein thrombosis) in pregnancy    RLE DVT 02/2016   Dyspnea    Essential hypertension 08/25/2017   Hyperlipidemia 11/21/2021   Morbid obesity (HCC)    Obesity hypoventilation syndrome (HCC) 05/03/2021   PE (pulmonary embolism) 02/29/2016   Sleep apnea    uses a cpap   Tracheostomy present (HCC) 01/05/2022   Size 6 shiley/cuffless  Last changed: 10/9     Discussion  Stable trach.  She does get a S[place] more short of breath with exertion.  Although I did not observe it on my exam she does endorse she will occasionally have a rush of air after removing PMV following activity.  This could suggest some mild air trapping from the trach.  Otherwise she is doing fantastic with the trach     Plan  Cont routin    Assessment: 58yo female admitted for multiple fractures after sustaining a fall, to transition from apixaban (last dose 4/10 8a) to heparin; admitting MD would like to keep goal at lower end.  Goal of Therapy:  Heparin level 0.3-0.5 units/ml aPTT 66-85 seconds Monitor platelets by anticoagulation protocol: Yes   Plan:  Start heparin infusion at 1900 units/hr (recently therapeutic at this  rate). Monitor heparin levels, aPTT (while DOAC affects anti-Xa assay), and CBC.  Vernard Gambles, PharmD, BCPS  10/23/2023,1:16 AM

## 2023-10-23 NOTE — Consult Note (Signed)
 HPI:     Patient is a 58 y.o. female who presented to ED s/p fall in her home yesterday. Workup in ED indicated L1 fx. No LBP currently. PMH CHF, obesity hypoventilation syndrome s/p trach, OSA, h/o PE on Eliquis.      Patient Active Problem List   Diagnosis Date Noted   History of CHF (congestive heart failure) 10/23/2023   OSA on CPAP 10/23/2023   History of DVT (deep vein thrombosis) 10/23/2023   Multiple fracture 10/23/2023   Fall at home, initial encounter 10/23/2023   Acute respiratory failure with hypoxia and hypercarbia (HCC) 10/23/2023   Tracheostomy dependence (HCC) 10/23/2023   Fall 10/23/2023   COPD exacerbation (HCC) 09/03/2023   Acute respiratory failure (HCC) 09/02/2023   CAP (community acquired pneumonia) 06/16/2022   Respiratory failure (HCC) 01/08/2022   Acute on chronic diastolic CHF (congestive heart failure) (HCC) 01/05/2022   Tracheostomy dependent (HCC) 01/05/2022   Acute metabolic encephalopathy 11/23/2021   History of pulmonary embolism 11/21/2021   Hyperlipidemia 11/21/2021   COPD (chronic obstructive pulmonary disease) (HCC) 05/08/2021   Obesity hypoventilation syndrome (HCC) 05/03/2021   OSA (obstructive sleep apnea) 08/18/2020   Chronic deep vein thrombosis (DVT) of femoral vein of left lower extremity (HCC) 02/09/2020   Chronic anticoagulation 02/09/2020   Acute hypoxemic respiratory failure (HCC) 11/22/2019   Right wrist fracture 06/14/2019   Chronic hypoxic respiratory failure (HCC) 06/13/2019   Benign essential HTN 08/25/2017   Class 3 severe obesity with serious comorbidity and body mass index (BMI) of 60.0 to 69.9 in adult (HCC) 10/29/2016   Vitamin D deficiency 10/29/2016   Past Medical History:  Diagnosis Date   Chronic diastolic CHF (congestive heart failure) (HCC) 01/05/2022   DVT (deep vein thrombosis) in pregnancy    RLE DVT 02/2016   Dyspnea    Essential hypertension 08/25/2017   Hyperlipidemia 11/21/2021   Morbid obesity  (HCC)    Obesity hypoventilation syndrome (HCC) 05/03/2021   PE (pulmonary embolism) 02/29/2016   Sleep apnea    uses a cpap   Tracheostomy present (HCC) 01/05/2022   Size 6 shiley/cuffless  Last changed: 10/9     Discussion  Stable trach.  She does get a [place] more short of breath with exertion.  Although I did not observe it on my exam she does endorse she will occasionally have a rush of air after removing PMV following activity.  This could suggest some mild air trapping from the trach.  Otherwise she is doing fantastic with the trach     Plan  Cont routin    Past Surgical History:  Procedure Laterality Date   CESAREAN SECTION     x 3   OPEN REDUCTION INTERNAL FIXATION (ORIF) DISTAL RADIAL FRACTURE Right 06/20/2019   Procedure: OPEN REDUCTION INTERNAL FIXATION (ORIF) DISTAL RADIAL FRACTURE;  Surgeon: Mack Hook, MD;  Location: [facility] OR;  Service: Orthopedics;  Laterality: Right;  REGIONAL BLOCK TOTAL SURGERY TIME: 90 MINUTES   SYNOVECTOMY Right 11/21/2019   Procedure: RIGHT WRIST LIGAMENT RECONSTRUCTION;  Surgeon: Mack Hook, MD;  Location[facility] OR;  Service: Orthopedics;  Laterality: Right;  PROCEDURE: RIGHT WRIST LIGAMENT RECONSTRUCTION LENGTH OF SURGERY: 105 MIN    Medications Prior to Admission  Medication Sig Dispense Refill Last Dose/Taking   amLODipine (NORVASC) 5 MG tablet Take 1 tablet (5 mg total) by mouth daily. 90 tablet 1 10/22/2023 Morning   apixaban (ELIQUIS) 5 MG TABS tablet Take 1 tablet (5 mg total) by mouth 2 (two) times daily. 180  tablet 3 10/22/2023 at  8:00 AM   atorvastatin (LIPITOR) 20 MG tablet Take 1 tablet (20 mg total) by mouth daily. 90 tablet 1 10/22/2023 Morning   dapagliflozin propanediol (FARXIGA) 10 MG TABS tablet Take 1 tablet (10 mg total) by mouth daily. 90 tablet 1 10/22/2023 Morning   furosemide (LASIX) 20 MG tablet Take 1 tablet (20 mg total) by mouth daily. Take 40 mg or 2 tablets for the next 5 days then resume 20 mg or 1 tablet by mouth daily by  mouth. 90 tablet 1 10/22/2023 Morning   PRESCRIPTION MEDICATION 1 each by Other route at bedtime. CPAP- At bedtime   10/21/2023   spironolactone (ALDACTONE) 25 MG tablet Take 1 tablet (25 mg total) by mouth daily. 90 tablet 1 10/22/2023 Morning   Misc. Devices MISC Please supply patient with tracheostomy humidifier  Z93.0 1 each 0    No Known Allergies  Social History   Tobacco Use   Smoking status: Former   Smokeless tobacco: Never   Tobacco comments:    smoked only as a teenager  Substance Use Topics   Alcohol use: No    Family History  Problem Relation Age of Onset   Hypertension Sister      Objective:   Patient Vitals for the past 8 hrs:  BP Temp Temp src Pulse Resp SpO2  10/23/23 0800 119/64 -- -- -- (!) 24 --  10/23/23 0744 (!) 126/52 -- -- (!) 10 (!) 23 98 %  10/23/23 0700 130/84 98.3 F (36.8 C) -- 64 (!) 22 100 %  10/23/23 0600 137/62 -- -- 86 (!) 21 100 %  10/23/23 0500 137/73 -- -- 70 (!) 22 97 %  10/23/23 0430 104/85 -- -- 64 (!) 22 98 %  10/23/23 0419 -- 97.6 F (36.4 C) Oral -- -- --  10/23/23 0400 103/64 -- -- 64 (!) 22 98 %  10/23/23 0330 110/60 -- -- 63 (!) 22 100 %  10/23/23 0300 131/69 -- -- 75 (!) 22 99 %  10/23/23 0230 (!) 140/72 -- -- 88 20 99 %  10/23/23 0215 -- -- -- 85 (!) 23 93 %  10/23/23 0210 (!) 156/65 -- -- 71 20 --  10/23/23 0130 129/68 -- -- 83 19 96 %   No intake/output data recorded. No intake/output data recorded.  Awake, alert Answers questions appropriately MAEs  Assessment:   This is a 58 year old female with acute L1 superior endplate fracture status post ground level fall.   Plan:   - conservative management -pain control - given body habitus I am doubtful that a TLSO would be of much use.  She may participate in activity as tolerated.  -  Call with questions or concerns   Marcelina Mclaurin CAYLIN Catelynn Sparger, PA-C

## 2023-10-23 NOTE — H&P (Signed)
 History and Physical    Angel Brewer ZOX:[id number] DOB: [birth year] DOA: 10/22/2023  PCP: Claiborne Rigg, NP   Patient coming from: Home   Chief Complaint:  Chief Complaint  Patient presents with   Fall   ED TRIAGE note:Pt BIB EMS from home with c/o fall at 1000 today, has been on the floor in the bathroom since. Was trying to sit on the toilet and fell, has c/o left leg and right shoulder. Pt has a trach, 60% on RA. Placed on 15lpm NRB, O2 increased to 98%. Denies LOC, does take eliquis, states that she did not hit her head.    20LFA   143/70 80HR Cbg 113  HPI:  Angel Brewer is a 58 y.o. female with medical history significant of chronic hypoxic hypercarbic respiratory failure tracheostomy dependent since May 2023, heart failure preserved EF, history of OSA on CPAP at bedtime and OSH, PE and DVT on Eliquis, and morbid obesity class III presented to emergency department complaining of mechanical fall.  Pt lives alone. The daughter could not get a hold of her today, so she went to check on her. Door was locked and she could hear mom calling out. EMS was called to the house and had to break in and knock down the bathroom door. Pt did fall off the toilet around 1000 and was unable to get up. Pt has diffuse pain. EMS said O2 sat was only 60% on RA when they arrived. She was put on 15 L NRB en route and O2 sats did come up to the mid-90s. Pt said she did not hit her head.   During my evaluation at the bedside patient found to have altered mental status.  Opening eyes with painful stimuli again going back to sleep. Unable to obtain any history from the patient.   At sensation to ED patient's blood pressure within good range.  O2 sat 96 to 100% on 2L butyl oxygen via trach collar. Initial Chem-8 showed elevated potassium 5.7 and elevated creatinine 1.1 however CMP showed improvement of potassium and creatinine level. CBC unremarkable. Elevated CK level 385.  Patient  has extensive imaging including CT head, CT cervical spine, CT chest, x-ray of the femur, vised of the tibia-fibula, extremity ankle, x-ray of the knee, x-ray of the shoulder, x-ray of the humerus and CT scan of the left knee with contrast is pending..  CT chest abdomen showed acute superior endplate deformity of L1 without significant anterior height loss.  Also patient has right clavicle fracture.  Probable left humerus fracture.  Left tibial plateau fracture.  In the ED patient has been evaluated by orthopedic surgery, trauma surgery and neurosurgery has been consulted as well.  ED physician Dr. Particia Nearing discussed case with Dr. Hulda Humphrey and stated that we will see patient for consult request any CT scan. Dr. Andrey Campanile (trauma surgeon) will see patient in consult.  Requesting for admit under medical service given patient has multiple comorbidities and acute on chronic oxygen requirement. Neurosurgery Paticia Stack, PA has been consulted will see patient for consult.  Stated that nothing acute to do for now.  Trauma surgeon Dr. Andrey Campanile recommended it is  safe to continue the chemical DVT prophylaxis however recommended to speak with pharmacy for recommendation.  Hospitalist has been consulted for admission for management of multiple fracture and managing multiple comorbidities.   Significant labs in the ED: Lab Orders         Comprehensive metabolic panel  CBC         Ethanol         Urinalysis, Routine w reflex microscopic -Urine, Clean Catch         Protime-INR         CK         Comprehensive metabolic panel         CBC         Heparin level (unfractionated)         APTT         Heparin level (unfractionated)         APTT         CBC         Blood gas, arterial         Blood gas, arterial         I-Stat Chem 8, ED         I-Stat Lactic Acid, ED         I-Stat venous blood gas, ED       Review of Systems:  Review of Systems  Unable to perform ROS: Mental status change     Past Medical History:  Diagnosis Date   Chronic diastolic CHF (congestive heart failure) (HCC) 01/05/2022   DVT (deep vein thrombosis) in pregnancy    RLE DVT 02/2016   Dyspnea    Essential hypertension 08/25/2017   Hyperlipidemia 11/21/2021   Morbid obesity (HCC)    Obesity hypoventilation syndrome (HCC) 05/03/2021   PE (pulmonary embolism) 02/29/2016   Sleep apnea    uses a cpap   Tracheostomy present (HCC) 01/05/2022   Size 6 shiley/cuffless  Last changed: 10/9     Discussion  Stable trach.  She does get a Angeletti more short of breath with exertion.  Although I did not observe it on my exam she does endorse she will occasionally have a rush of air after removing PMV following activity.  This could suggest some mild air trapping from the trach.  Otherwise she is doing fantastic with the trach     Plan  Cont routin    Past Surgical History:  Procedure Laterality Date   CESAREAN SECTION     x 3   OPEN REDUCTION INTERNAL FIXATION (ORIF) DISTAL RADIAL FRACTURE Right 06/20/2019   Procedure: OPEN REDUCTION INTERNAL FIXATION (ORIF) DISTAL RADIAL FRACTURE;  Surgeon: Mack Hook, MD;  Location: [facility] OR;  Service: Orthopedics;  Laterality: Right;  REGIONAL BLOCK TOTAL SURGERY TIME: 90 MINUTES   SYNOVECTOMY Right 11/21/2019   Procedure: RIGHT WRIST LIGAMENT RECONSTRUCTION;  Surgeon: Mack Hook, MD;  Location: [facility] OR;  Service: Orthopedics;  Laterality: Right;  PROCEDURE: RIGHT WRIST LIGAMENT RECONSTRUCTION LENGTH OF SURGERY: 105 MIN     reports that she has quit smoking. She has never used smokeless tobacco. She reports that she does not drink alcohol and does not use drugs.  No Known Allergies  Family History  Problem Relation Age of Onset   Hypertension Sister     Prior to Admission medications   Medication Sig Start Date End Date Taking? Authorizing Provider  amLODipine (NORVASC) 5 MG tablet Take 1 tablet (5 mg total) by mouth daily. 06/17/23  Yes Claiborne Rigg, NP   apixaban (ELIQUIS) 5 MG TABS tablet Take 1 tablet (5 mg total) by mouth 2 (two) times daily. 06/17/23  Yes Claiborne Rigg, NP  atorvastatin (LIPITOR) 20 MG tablet Take 1 tablet (20 mg total) by mouth daily. 06/17/23  Yes Claiborne Rigg, NP  dapagliflozin  propanediol (FARXIGA) 10 MG TABS tablet Take 1 tablet (10 mg total) by mouth daily. 06/17/23  Yes Claiborne Rigg, NP  furosemide (LASIX) 20 MG tablet Take 1 tablet (20 mg total) by mouth daily. Take 40 mg or 2 tablets for the next 5 days then resume 20 mg or 1 tablet by mouth daily by mouth. 06/17/23  Yes Claiborne Rigg, NP  PRESCRIPTION MEDICATION 1 each by Other route at bedtime. CPAP- At bedtime   Yes [provider]  spironolactone (ALDACTONE) 25 MG tablet Take 1 tablet (25 mg total) by mouth daily. 06/17/23  Yes Claiborne Rigg, NP  Misc. Devices MISC Please supply patient with tracheostomy humidifier  Z93.0 03/25/22   Claiborne Rigg, NP     Physical Exam: Vitals:   10/23/23 0210 10/23/23 0215 10/23/23 0230 10/23/23 0300  BP: (!) 156/65  (!) 140/72 131/69  Pulse: 71 85 88 75  Resp: 20 (!) 23 20 (!) 22  Temp:      TempSrc:      SpO2:  93% 99% 99%  Weight:      Height:        Physical Exam Constitutional:      Appearance: She is obese. She is ill-appearing.  HENT:     Mouth/Throat:     Mouth: Mucous membranes are moist.  Cardiovascular:     Rate and Rhythm: Normal rate and regular rhythm.     Pulses: Normal pulses.     Heart sounds: Normal heart sounds.  Pulmonary:     Effort: Pulmonary effort is normal. No respiratory distress.     Breath sounds: Normal breath sounds. No wheezing.  Abdominal:     Palpations: Abdomen is soft.  Musculoskeletal:     Cervical back: Neck supple.  Skin:    General: Skin is dry.     Capillary Refill: Capillary refill takes less than 2 seconds.  Neurological:     Comments: Altered mental status.      Labs on Admission: I have personally reviewed following labs and imaging  studies  CBC: Recent Labs  Lab 10/22/23 2102 10/22/23 2108 10/23/23 0207  WBC  --  9.8  --   HGB 17.0* 13.4 15.3*  HCT 50.0* 44.7 45.0  MCV  --  97.4  --   PLT  --  236  --    Basic Metabolic Panel: Recent Labs  Lab 10/22/23 2102 10/22/23 2108 10/23/23 0207  NA 138 138 140  K 5.7* 4.5 4.5  CL 101 97*  --   CO2  --  30  --   GLUCOSE 101* 104*  --   BUN 26* 17  --   CREATININE 1.10* 0.96  --   CALCIUM  --  9.1  --    GFR: Estimated Creatinine Clearance: 107.7 mL/min (by C-G formula based on SCr of 0.96 mg/dL). Liver Function Tests: Recent Labs  Lab 10/22/23 2108  AST 22  ALT 11  ALKPHOS 58  BILITOT 1.7*  PROT 7.6  ALBUMIN 3.2*   No results for input(s): "LIPASE", "AMYLASE" in the last 168 hours. No results for input(s): "AMMONIA" in the last 168 hours. Coagulation Profile: Recent Labs  Lab 10/22/23 2108  INR 1.1   Cardiac Enzymes: Recent Labs  Lab 10/22/23 2108  CKTOTAL 385*   BNP (last 3 results) Recent Labs    09/02/23 1249  BNP 74.2   HbA1C: No results for input(s): "HGBA1C" in the last 72 hours. CBG: No results for input(s): "  GLUCAP" in the last 168 hours. Lipid Profile: No results for input(s): "CHOL", "HDL", "LDLCALC", "TRIG", "CHOLHDL", "LDLDIRECT" in the last 72 hours. Thyroid Function Tests: No results for input(s): "TSH", "T4TOTAL", "FREET4", "T3FREE", "THYROIDAB" in the last 72 hours. Anemia Panel: No results for input(s): "VITAMINB12", "FOLATE", "FERRITIN", "TIBC", "IRON", "RETICCTPCT" in the last 72 hours. Urine analysis:    Component Value Date/Time   COLORURINE YELLOW 09/02/2023 1801   APPEARANCEUR CLEAR 09/02/2023 1801   LABSPEC 1.031 (H) 09/02/2023 1801   PHURINE 5.0 09/02/2023 1801   GLUCOSEU >=500 (A) 09/02/2023 1801   HGBUR NEGATIVE 09/02/2023 1801   BILIRUBINUR NEGATIVE 09/02/2023 1801   KETONESUR NEGATIVE 09/02/2023 1801   PROTEINUR NEGATIVE 09/02/2023 1801   NITRITE NEGATIVE 09/02/2023 1801   LEUKOCYTESUR  NEGATIVE 09/02/2023 1801    Radiological Exams on Admission: I have personally reviewed images DG Shoulder Left Result Date: 10/23/2023 CLINICAL DATA:  Pain after fall EXAM: LEFT SHOULDER - 2+ VIEW COMPARISON:  None Available. FINDINGS: Mild AC joint and glenohumeral degenerative change. No definitive fracture or malalignment. IMPRESSION: Mild degenerative change. Electronically Signed   By: Jasmine Pang M.D.   On: 10/23/2023 00:44   DG Shoulder 1 View Right Result Date: 10/23/2023 CLINICAL DATA:  Pain after fall EXAM: RIGHT SHOULDER - 1 VIEW COMPARISON:  None Available. FINDINGS: Mild AC joint and glenohumeral degenerative change. No definitive fracture or malalignment. IMPRESSION: Mild degenerative change. Electronically Signed   By: Jasmine Pang M.D.   On: 10/23/2023 00:44   CT Knee Left Wo Contrast Result Date: 10/23/2023 CLINICAL DATA:  Knee trauma tibial plateau fracture EXAM: CT OF THE LEFT KNEE WITHOUT CONTRAST TECHNIQUE: Multidetector CT imaging of the left knee was performed according to the standard protocol. Multiplanar CT image reconstructions were also generated. RADIATION DOSE REDUCTION: This exam was performed according to the departmental dose-optimization program which includes automated exposure control, adjustment of the mA and/or kV according to patient size and/or use of iterative reconstruction technique. COMPARISON:  Radiographs 10/22/2023 FINDINGS: Bones/Joint/Cartilage Small knee effusion. No significant lipohemarthrosis. Mild lateral deviation of the patella. Small foci of gas in the subpatellar joint space. Overall grainy image quality, likely due to habitus. No definitive acute fracture seen. Minimal deformity with sclerosis on sagittal series 10, image 57, could be due to remote injury. 4 mm intra-articular calcification slightly peripheral to the tibial spines could reflect small loose body. Tricompartment arthritis with moderate severe medial tibiofemoral joint space  narrowing, prominent spurring and subarticular sclerosis and cyst formation. Moderate patellofemoral degenerative changes, worse on the lateral side. Ligaments Suboptimally assessed by CT. Muscles and Tendons No intramuscular fluid collections. Quadriceps tendon and patellar tendon appear grossly intact. Soft tissues Mild soft tissue swelling. IMPRESSION: 1. Limited secondary to body habitus and overall grainy appearance of images. No definitive acute fracture is seen. Trace joint effusion without evidence for lipohemarthrosis. Possible small calcified loose body within the anterior tibiofemoral joint space near the tibial spines 2. Small foci of gas in the subpatellar joint space, correlate for any recent intervention. 3. Tricompartment arthritis, moderate to severe in the medial tibiofemoral compartment. Electronically Signed   By: Jasmine Pang M.D.   On: 10/23/2023 00:43   DG Humerus Left Result Date: 10/23/2023 CLINICAL DATA:  Pain after fall EXAM: LEFT HUMERUS - 2+ VIEW COMPARISON:  None Available. FINDINGS: There is no evidence of fracture or other focal bone lesions. Soft tissues are unremarkable. IMPRESSION: Negative. Electronically Signed   By: Jasmine Pang M.D.   On: 10/23/2023 00:25  DG Femur 1 View Left Result Date: 10/22/2023 CLINICAL DATA:  [id number] Fall 190176; pain EXAM: PORTABLE LEFT KNEE - 1-2 VIEW; LEFT TIBIA AND FIBULA - 2 VIEW; LEFT FEMUR 1 VIEW; PORTABLE LEFT ANKLE - 2 VIEW COMPARISON:  None Available. FINDINGS: Acute displaced tibial plateau intercondylar fracture. No dislocation. Limited evaluation for joint effusion/lipohemarthrosis due to collimation of view and only frontal view of the femur. Severe tricompartmental degenerative changes of the knee. Soft tissues are unremarkable. IMPRESSION: 1. Acute displaced tibial plateau intercondylar fracture. 2. Limited evaluation for joint effusion/lipohemarthrosis due to collimation of view and only frontal view of the femur. 3. Severe  tricompartmental degenerative changes of the knee. 4. No other acute fracture or dislocation of the left femur, knee, tibia fibula, ankle. Electronically Signed   By: Tish Frederickson M.D.   On: 10/22/2023 23:13   DG Tibia/Fibula Left Result Date: 10/22/2023 CLINICAL DATA:  [id number] Fall 190176; pain EXAM: PORTABLE LEFT KNEE - 1-2 VIEW; LEFT TIBIA AND FIBULA - 2 VIEW; LEFT FEMUR 1 VIEW; PORTABLE LEFT ANKLE - 2 VIEW COMPARISON:  None Available. FINDINGS: Acute displaced tibial plateau intercondylar fracture. No dislocation. Limited evaluation for joint effusion/lipohemarthrosis due to collimation of view and only frontal view of the femur. Severe tricompartmental degenerative changes of the knee. Soft tissues are unremarkable. IMPRESSION: 1. Acute displaced tibial plateau intercondylar fracture. 2. Limited evaluation for joint effusion/lipohemarthrosis due to collimation of view and only frontal view of the femur. 3. Severe tricompartmental degenerative changes of the knee. 4. No other acute fracture or dislocation of the left femur, knee, tibia fibula, ankle. Electronically Signed   By: Tish Frederickson M.D.   On: 10/22/2023 23:13   DG Ankle Left Port Result Date: 10/22/2023 CLINICAL DATA:  [id number] Fall 190176; pain EXAM: PORTABLE LEFT KNEE - 1-2 VIEW; LEFT TIBIA AND FIBULA - 2 VIEW; LEFT FEMUR 1 VIEW; PORTABLE LEFT ANKLE - 2 VIEW COMPARISON:  None Available. FINDINGS: Acute displaced tibial plateau intercondylar fracture. No dislocation. Limited evaluation for joint effusion/lipohemarthrosis due to collimation of view and only frontal view of the femur. Severe tricompartmental degenerative changes of the knee. Soft tissues are unremarkable. IMPRESSION: 1. Acute displaced tibial plateau intercondylar fracture. 2. Limited evaluation for joint effusion/lipohemarthrosis due to collimation of view and only frontal view of the femur. 3. Severe tricompartmental degenerative changes of the knee. 4. No other acute fracture  or dislocation of the left femur, knee, tibia fibula, ankle. Electronically Signed   By: Tish Frederickson M.D.   On: 10/22/2023 23:13   DG Knee Left Port Result Date: 10/22/2023 CLINICAL DATA:  [id number] Fall 190176; pain EXAM: PORTABLE LEFT KNEE - 1-2 VIEW; LEFT TIBIA AND FIBULA - 2 VIEW; LEFT FEMUR 1 VIEW; PORTABLE LEFT ANKLE - 2 VIEW COMPARISON:  None Available. FINDINGS: Acute displaced tibial plateau intercondylar fracture. No dislocation. Limited evaluation for joint effusion/lipohemarthrosis due to collimation of view and only frontal view of the femur. Severe tricompartmental degenerative changes of the knee. Soft tissues are unremarkable. IMPRESSION: 1. Acute displaced tibial plateau intercondylar fracture. 2. Limited evaluation for joint effusion/lipohemarthrosis due to collimation of view and only frontal view of the femur. 3. Severe tricompartmental degenerative changes of the knee. 4. No other acute fracture or dislocation of the left femur, knee, tibia fibula, ankle. Electronically Signed   By: Tish Frederickson M.D.   On: 10/22/2023 23:13   DG Pelvis Portable Result Date: 10/22/2023 CLINICAL DATA:  Trauma EXAM: PORTABLE PELVIS 1-2 VIEWS COMPARISON:  CT abdomen pelvis 10/22/2023 FINDINGS:  Limited evaluation due to overlapping osseous structures and overlying soft tissues. There is no evidence of pelvic fracture or diastasis. No acute displaced fracture or dislocation of either hips on frontal view. No pelvic bone lesions are seen. IMPRESSION: Negative for acute traumatic injury. Electronically Signed   By: Tish Frederickson M.D.   On: 10/22/2023 22:58   DG Chest Port 1 View Result Date: 10/22/2023 CLINICAL DATA:  Trauma fall. EXAM: PORTABLE CHEST 1 VIEW COMPARISON:  CT chest 09/02/2023 FINDINGS: Tracheostomy tube terminates 8 cm above the carina. Cardiomegaly.  The heart and mediastinal contours are unchanged. No focal consolidation. No pulmonary edema. No pleural effusion. No pneumothorax. No acute  osseous abnormality. IMPRESSION: No active disease. Electronically Signed   By: Tish Frederickson M.D.   On: 10/22/2023 22:56   CT HEAD WO CONTRAST Result Date: 10/22/2023 CLINICAL DATA:  Head trauma, moderate-severe; Polytrauma, blunt EXAM: CT HEAD WITHOUT CONTRAST CT CERVICAL SPINE WITHOUT CONTRAST TECHNIQUE: Multidetector CT imaging of the head and cervical spine was performed following the standard protocol without intravenous contrast. Multiplanar CT image reconstructions of the cervical spine were also generated. RADIATION DOSE REDUCTION: This exam was performed according to the departmental dose-optimization program which includes automated exposure control, adjustment of the mA and/or kV according to patient size and/or use of iterative reconstruction technique. COMPARISON:  CT head 08/19/2020 FINDINGS: CT HEAD FINDINGS Brain: No evidence of large-territorial acute infarction. No parenchymal hemorrhage. No mass lesion. No extra-axial collection. No mass effect or midline shift. No hydrocephalus. Basilar cisterns are patent. Vascular: No hyperdense vessel. Skull: No acute fracture or focal lesion. Sinuses/Orbits: Paranasal sinuses and mastoid air cells are clear. The orbits are unremarkable. Other: None. CT CERVICAL SPINE FINDINGS Alignment: Normal. Skull base and vertebrae: Limited evaluation due to quantum mottle artifact. No acute fracture. No aggressive appearing focal osseous lesion or focal pathologic process. Soft tissues and spinal canal: No prevertebral fluid or swelling. No visible canal hematoma. Upper chest: Unremarkable. Other: Tracheostomy tube tip 7 cm above the carina. IMPRESSION: 1. No acute intracranial abnormality. 2. No acute displaced fracture or traumatic listhesis of the cervical spine. Limited evaluation due to body habitus. Electronically Signed   By: Tish Frederickson M.D.   On: 10/22/2023 22:55   CT CERVICAL SPINE WO CONTRAST Result Date: 10/22/2023 CLINICAL DATA:  Head trauma,  moderate-severe; Polytrauma, blunt EXAM: CT HEAD WITHOUT CONTRAST CT CERVICAL SPINE WITHOUT CONTRAST TECHNIQUE: Multidetector CT imaging of the head and cervical spine was performed following the standard protocol without intravenous contrast. Multiplanar CT image reconstructions of the cervical spine were also generated. RADIATION DOSE REDUCTION: This exam was performed according to the departmental dose-optimization program which includes automated exposure control, adjustment of the mA and/or kV according to patient size and/or use of iterative reconstruction technique. COMPARISON:  CT head 08/19/2020 FINDINGS: CT HEAD FINDINGS Brain: No evidence of large-territorial acute infarction. No parenchymal hemorrhage. No mass lesion. No extra-axial collection. No mass effect or midline shift. No hydrocephalus. Basilar cisterns are patent. Vascular: No hyperdense vessel. Skull: No acute fracture or focal lesion. Sinuses/Orbits: Paranasal sinuses and mastoid air cells are clear. The orbits are unremarkable. Other: None. CT CERVICAL SPINE FINDINGS Alignment: Normal. Skull base and vertebrae: Limited evaluation due to quantum mottle artifact. No acute fracture. No aggressive appearing focal osseous lesion or focal pathologic process. Soft tissues and spinal canal: No prevertebral fluid or swelling. No visible canal hematoma. Upper chest: Unremarkable. Other: Tracheostomy tube tip 7 cm above the carina. IMPRESSION: 1. No acute intracranial  abnormality. 2. No acute displaced fracture or traumatic listhesis of the cervical spine. Limited evaluation due to body habitus. Electronically Signed   By: Tish Frederickson M.D.   On: 10/22/2023 22:55   CT CHEST ABDOMEN PELVIS W CONTRAST Result Date: 10/22/2023 CLINICAL DATA:  Trauma, fall on blood thinners EXAM: CT CHEST, ABDOMEN, AND PELVIS WITH CONTRAST TECHNIQUE: Multidetector CT imaging of the chest, abdomen and pelvis was performed following the standard protocol during bolus  administration of intravenous contrast. RADIATION DOSE REDUCTION: This exam was performed according to the departmental dose-optimization program which includes automated exposure control, adjustment of the mA and/or kV according to patient size and/or use of iterative reconstruction technique. CONTRAST:  65mL OMNIPAQUE IOHEXOL 350 MG/ML SOLN COMPARISON:  CT chest angiogram, 09/02/2023 FINDINGS: CT CHEST FINDINGS Cardiovascular: Aortic atherosclerosis. Cardiomegaly. No pericardial effusion. Mediastinum/Nodes: No enlarged mediastinal, hilar, or axillary lymph nodes. Thyroid gland, trachea, and esophagus demonstrate no significant findings. Lungs/Pleura: Tracheostomy. Mild bibasilar scarring or atelectasis and diffuse mosaic attenuation of the airspaces, suggesting small airways disease. No pleural effusion or pneumothorax. Musculoskeletal: Soft tissue contusion and fat stranding involving the right sternocleidomastoid muscle and overlying superficial subcutaneous soft tissues (series 3, image 6). Nondisplaced fractures of the right clavicular head (series 6, image 74). No dislocation of the sternoclavicular joint. Suspect nondisplaced fractures of the proximal left humerus, incompletely imaged and suboptimally assessed (series 6, image 65). CT ABDOMEN PELVIS FINDINGS Hepatobiliary: Hepatomegaly, maximum coronal span 20.5 cm. No solid liver abnormality is seen. No gallstones, gallbladder wall thickening, or biliary dilatation. Pancreas: Unremarkable. No pancreatic ductal dilatation or surrounding inflammatory changes. Spleen: Normal in size without significant abnormality. Adrenals/Urinary Tract: Unchanged, definitively benign right adrenal adenoma, requiring no further follow-up or characterization. Kidneys are normal, without renal calculi, solid lesion, or hydronephrosis. Bladder is unremarkable. Stomach/Bowel: Stomach is within normal limits. Appendix appears normal. No evidence of bowel wall thickening,  distention, or inflammatory changes. Vascular/Lymphatic: Multiple small left ovarian vein phleboliths, which do not reflect urinary tract calculi. No enlarged abdominal or pelvic lymph nodes. Reproductive: No mass or other abnormality. Other: Small, fat containing umbilical hernia.  No ascites. Musculoskeletal: New, likely acute superior endplate deformity of L1 without significant anterior height loss (series 7, image 64). Disc degenerative disease and bridging osteophytosis throughout the thoracic spine. IMPRESSION: 1. New, likely acute superior endplate deformity of L1 without significant anterior height loss. 2. Nondisplaced fractures of the right clavicular head. No dislocation of the sternoclavicular joint. Soft tissue contusion and fat stranding involving the right sternocleidomastoid muscle and overlying superficial subcutaneous soft tissues. 3. Suspect nondisplaced fractures of the proximal left humerus, incompletely imaged and suboptimally assessed. 4. No CT evidence of organ injury to the chest, abdomen, or pelvis. 5. Tracheostomy. 6. Cardiomegaly. Aortic Atherosclerosis (ICD10-I70.0). Electronically Signed   By: Jearld Lesch M.D.   On: 10/22/2023 22:10     EKG: My personal interpretation of EKG shows: EKG showing normal sinus rhythm heart rate 81.    Assessment/Plan: Principal Problem:   Multiple fracture Active Problems:   Chronic hypoxic respiratory failure (HCC)   Acute metabolic encephalopathy   History of pulmonary embolism   Acute hypoxemic respiratory failure (HCC)   History of CHF (congestive heart failure)   OSA on CPAP   History of DVT (deep vein thrombosis)   Fall at home, initial encounter    Assessment and Plan: Multiple fracture Mechanical fall -Patient presenting from home for a mechanical fall.  EMS found patient O2 sat 60% on room air when they  arrived.  Placed on nonrebreather 12 L via trach collar O2 sat improved. - Had extensive imaging in the ED. -Pending  CT scan of the knee of the left result. - In the ED patient has been evaluated by trauma surgeon Dr. Andrey Campanile.  Patient is found to have multiple fracture which is following: Superior endplate fracture of L1. Right clavicle fracture with surrounding soft tissue ending. Probable left humerus fracture. Left tibial plateau fracture. -ED physician Dr. Particia Nearing discussed case with Dr. Hulda Humphrey and stated that we will see patient for consult request any CT scan. Dr. Andrey Campanile (trauma surgeon) will see patient in consult.  Requesting for admit under medical service given patient has multiple comorbidities and acute on chronic oxygen requirement. Neurosurgery Paticia Stack, PA has been consulted will see patient for consult.  Stated that nothing acute to do for now. -Given patient has history of PE and DVT holding Eliquis as planning for surgical intervention either today or tomorrow.  (History of DVT in 2023 and PE 2021). - Consulted pharmacy for IV heparin drip for full anticoagulation prophylaxis to prevent further DVT and PE as well as DVT prophylaxis. -Keeping patient n.p.o. -Continue Tylenol 1000 mg 3 times daily.  Will avoid IV opioids as possible in the setting of acute hypoxic respiratory failure.   Acute metabolic encephalopathy in the setting of acute hypoxic and hypercarbic respiratory failure. Acute on chronic hypoxic respiratory failure History of OSA on CPAP Chronic hypoxic and hypercarbic respiratory failure on tracheostomy and trach collar dependent. - EMS found patient O2 sat 60% on room air.  Placed on 2 L oxygen via trach collar and O2 sat has been improved to 96% to 100%. - Chest x-ray no active disease process. -ABG showing low pH 7.2, elevated pCO2 93, pO2 50, and elevated bicarb 37.8. - Immediately paged respiratory care and starting on mechanical ventilation with trach collar management. -Discussed case with PCCM Dr. Gaynell Face recommended trach collar with vent management.  History  of PE and DVT -History of PE in 2021 and DVT in 2023. - Holding Eliquis. - Continue IV heparin as full dose anticoagulation with pharmacy consult.  History of chronic CHF Essential hypertension - Due to altered mental status unable to giving oral medications.  Continue as needed hydralazine.   DVT prophylaxis:  IV heparin gtts Code Status:  Full Code-by default Diet: Currently NPO. Family Communication:   Family was present at bedside, at the time of interview.  Opportunity was given to ask question and all questions were answered satisfactorily.  Disposition Plan: Continue monitor improvement of mentation in the setting of hypercarbic respiratory failure.  Need to follow-up with orthopedics, trauma surgery and neurosurgery plan. Consults: Orthopedic surgeon, trauma surgeon, neurosurgeon and PCCM Admission status:   Inpatient, Step Down Unit  Severity of Illness: The appropriate patient status for this patient is INPATIENT. Inpatient status is judged to be reasonable and necessary in order to provide the required intensity of service to ensure the patient's safety. The patient's presenting symptoms, physical exam findings, and initial radiographic and laboratory data in the context of their chronic comorbidities is felt to place them at high risk for further clinical deterioration. Furthermore, it is not anticipated that the patient will be medically stable for discharge from the hospital within 2 midnights of admission.   * I certify that at the point of admission it is my clinical judgment that the patient will require inpatient hospital care spanning beyond 2 midnights from the point of admission due to high  intensity of service, high risk for further deterioration and high frequency of surveillance required.Marland Kitchen    Tereasa Coop, MD [facility]  How to contact the [facility] Attending or Consulting provider 7A - 7P or covering provider during after hours 7P -7A, for this  patient.  Check the care team in [facility] and look for a) attending/consulting TRH provider listed and b) the [facility] team listed Log into [email or web address] and use [place]'s universal password to access. If you do not have the password, please contact the hospital operator. Locate the [facility] provider you are looking for under [facility] and page to a number that you can be directly reached. If you still have difficulty reaching the provider, please page the [facility] (Director on Call) for the Hospitalists listed on amion for assistance.  10/23/2023, 3:26 AM

## 2023-10-23 NOTE — Consult Note (Signed)
 WOC team consulted for "trach care".  Secure chat sent to primary RN for clarification. Per Critical care note there is no erythema or drainage from around trach site. Bedside RN denies any skin issues noted around trach.   WOC team will sign off.  If further needs identified please reconsult.   Thank you,     Priscella Mann MSN, RN-BC, [employer] [phone]

## 2023-10-23 NOTE — Consult Note (Signed)
 NAME:  Angel Brewer, MRN:  [id number], DOB:  1966-06-16, LOS: 0 ADMISSION DATE:  10/22/2023 CONSULTATION DATE:  10/23/2023 REFERRING MD:  Angel Brewer - TRH CHIEF COMPLAINT: Fall, acute-on-chronic respiratory failure   History of Present Illness:  58 year old woman who presented to [facility] ED 4/10 for fall and AoC RF. PMHx significant for HTN, HLD, chronic diastolic CHF (Echo 11/2021 with EF 60-65%, mild LVH), OHS/OSA s/p tracheostomy 11/2021 (on CPAP/nocturnal O2, followed by Angel Simmonds, NP) with chronic hypoxic and hypercarbic respiratory failure, DVT/PE (on Eliquis). Recent hospitalization 08/2023 for AoC RF, due to follow up in trach clinic around current time (~6 weeks from discharge date).   History is obtained primarily from chart review. Patient reportedly had a fall from the toilet around 1000 4/10AM and was unable to get up from the bathroom floor; experienced L leg pain and R shoulder pain. EMS was called and on arrival she was noted to be hypoxic on RA (SpO2 60s) and was placed on NRB 15L with SpO2 improved to 98%. Denied LOC/head injury.  On ED arrival, patient was afebrile, HR 96, BP 165/118, RR 25, SpO2 100% on NRB. CXR showed no active disease. CT Head/C-spine with NAICA, no acute displaced fracture/traumatic listhesis of C-spine. CT Chest/A/P showed new endplate deformity of L1, nondisplaced fractures of the R clavicular head, nondisplaced fractures of the proximal L humerus, tracheostomy. XR of L Femur demonstrated acute displaced tibial plateau intercondylar fracture and tricompartmental degenerative changes of the L knee.  Labs were notable for CBC WNL, INR 1.1, Na 138, K 4.5, CO2 30, Cr 0.96, AST/ALT/Alk Phos WNL, Tbili 1.7. CK 385, LA 1.6. Ethanol < 10. Initial VBG with pH 7.215/pCO2 93.7, bicarb 37.8. Patient was deemed to require mechanical ventilation and tracheostomy was exchanged by RT from #6 Shiley cuffless to #6 Shiley cuffed tracheostomy. Patient was subsequently placed on MV.  Repeat ABG improved to 7.349/pCO2 64.8/pO2 63/bicarb 35.7.  PCCM consulted for evaluation and management.  Angel Brewer states she felt dizzy prior to her fall, then was too weak too stand up (later found to have fractures as well). She feels like she has been sleeping much more lately and that her energy really never recovered from her hospitalization in February. She otherwise felt she had been doing well, no issues with her oxygenation or tracheostomy. Denies fever/chills, CP/SOB, n/v/d, recent sick contacts.  Pertinent Medical History:   Past Medical History:  Diagnosis Date   Chronic diastolic CHF (congestive heart failure) (HCC) 01/05/2022   DVT (deep vein thrombosis) in pregnancy    RLE DVT 02/2016   Dyspnea    Essential hypertension 08/25/2017   Hyperlipidemia 11/21/2021   Morbid obesity (HCC)    Obesity hypoventilation syndrome (HCC) 05/03/2021   PE (pulmonary embolism) 02/29/2016   Sleep apnea    uses a cpap   Tracheostomy present (HCC) 01/05/2022   Size 6 shiley/cuffless  Last changed: 10/9     Discussion  Stable trach.  She does get a [place] more short of breath with exertion.  Although I did not observe it on my exam she does endorse she will occasionally have a rush of air after removing PMV following activity.  This could suggest some mild air trapping from the trach.  Otherwise she is doing fantastic with the trach     Plan  Cont routin   Significant Hospital Events: Including procedures, antibiotic start and stop dates in addition to other pertinent events   4/10 - Presented to [facility] after fall. Hypoxic on  RA with SpO2 60s per EMS, placed on NRB with improvement. Initial VBG poor with pH 7.219  Interim History / Subjective:  PCCM consulted for tracheostomy/MV management.  Objective:  Blood pressure 137/73, pulse 70, temperature 97.6 F (36.4 C), temperature source Oral, resp. rate (!) 22, height 5\' 6"  (1.676 m), weight (!) 178 kg, last menstrual period 11/29/2015, SpO2  97%.    Vent Mode: PRVC FiO2 (%):  [40 %-50 %] 40 % Set Rate:  [22 bmp] 22 bmp Vt Set:  [470 mL] 470 mL PEEP:  [5 cmH20] 5 cmH20  No intake or output data in the 24 hours ending 10/23/23 0522 Filed Weights   10/22/23 2044  Weight: (!) 178 kg   Physical Examination: General: Acute-on-chronically ill-appearing middle-aged woman in NAD. HEENT: [place]/AT, anicteric sclera, PERRL, slightly dry mucous membranes. Neck: Tracheostomy present and midline without erythema/drainage, #6 Shiley cuffed (changed today). Neuro: Awake, oriented x 4. Responds to verbal stimuli. Following commands consistently. Moves all 4 extremities spontaneously. Decreased strength to RUE/LLE in the setting of fractures/pain. +Sensation intact, fine motor intact all 4 extremities. CV: RRR, no m/g/r. PULM: Breathing even and unlabored on vent (PEEP 5, FIO2 40%). Lung fields CTAB in upper fields, diminished at bases. GI: Obese, soft, nontender, nondistended. Normoactive bowel sounds. Extremities: No significant LE edema noted. Skin: Warm/dry, no rashes.  Resolved Hospital Problem List:    Assessment & Plan:  Acute-on-chronic hypoxic and hypercarbic respiratory failure Acute mechanical ventilation need in the setting of respiratory failure Tracheostomy status, 11/2021 OSA/OHS Presented to [facility] 4/10 post-fall at home, felt dizzy prior to fall from toilet. SpO2 60% on RA on EMS arrival. Placed on NRB with significant improvement. Hypercarbic and hypoxic on gas, Shiley #6 cuffless exchanged to #6 cuffed for MV. Placed on ventilator with significant improvement in hypercarbia and mentation. - Continue full vent support (4-8cc/kg IBW) for now - Wean FiO2 for O2 sat > 90% - Daily WUA/SBT as able, mental status significantly improved with clearance of hypercarbia - VAP bundle - Trach care per protocol - Bronchodilators (DuoNebs PRN) - Pulmonary hygiene - PAD protocol for sedation: Fentanyl versus Morphine PRN for goal RASS 0  to -1 - Optimize volume status as able, diuresis as tolerated - PMV once able to wean from MV, uses at baseline - Follow CXR, ABG  Mechanical fall with multiple fractures (R clavicle, L tibial plateau, L1 vertebrae) - Trauma and Ortho/NSG consulted, following - Awaiting improvement in/stabilization of respiratory requirements prior to any surgical intervention - Sling for R clavicular fracture management - Bedrest until cleared - PT/OT as appropriate   History of PE/DVT - Heparin gtt for [facility] at present - Monitor for signs of bleeding  Best Practice (right click and "Reselect all SmartList Selections" daily)   Diet/type: NPO DVT prophylaxis: SCDs, hold AC in the setting of possible need for surgical intervention GI prophylaxis: PPI Central venous access:  N/A Foley:  N/A Code Status:  full code Last date of multidisciplinary goals of care discussion [Pending]  Labs:  CBC: Recent Labs  Lab 10/22/23 2102 10/22/23 2108 10/23/23 0207 10/23/23 0408  WBC  --  9.8  --  8.5  HGB 17.0* 13.4 15.3* 12.5  HCT 50.0* 44.7 45.0 42.2  MCV  --  97.4  --  98.1  PLT  --  236  --  205   Basic Metabolic Panel: Recent Labs  Lab 10/22/23 2102 10/22/23 2108 10/23/23 0207 10/23/23 0408  NA 138 138 140 140  K 5.7*  4.5 4.5 4.6  CL 101 97*  --  99  CO2  --  30  --  33*  GLUCOSE 101* 104*  --  101*  BUN 26* 17  --  17  CREATININE 1.10* 0.96  --  0.98  CALCIUM  --  9.1  --  8.7*   GFR: Estimated Creatinine Clearance: 105.5 mL/min (by C-G formula based on SCr of 0.98 mg/dL). Recent Labs  Lab 10/22/23 2102 10/22/23 2108 10/23/23 0408  WBC  --  9.8 8.5  LATICACIDVEN 1.6  --   --    Liver Function Tests: Recent Labs  Lab 10/22/23 2108 10/23/23 0408  AST 22 23  ALT 11 11  ALKPHOS 58 52  BILITOT 1.7* 1.5*  PROT 7.6 6.6  ALBUMIN 3.2* 2.9*   No results for input(s): "LIPASE", "AMYLASE" in the last 168 hours. No results for input(s): "AMMONIA" in the last 168 hours.  ABG:     Component Value Date/Time   PHART 7.335 (L) 09/02/2023 1858   PCO2ART 64.5 (H) 09/02/2023 1858   PO2ART 90 09/02/2023 1858   HCO3 37.8 (H) 10/23/2023 0207   TCO2 41 (H) 10/23/2023 0207   ACIDBASEDEF 4.0 (H) 05/01/2021 0158   O2SAT 74 10/23/2023 0207    Coagulation Profile: Recent Labs  Lab 10/22/23 2108  INR 1.1   Cardiac Enzymes: Recent Labs  Lab 10/22/23 2108  CKTOTAL 385*   HbA1C: Hemoglobin A1C  Date/Time Value Ref Range Status  06/10/2016 04:41 PM 5.4  Final   CBG: No results for input(s): "GLUCAP" in the last 168 hours.  Review of Systems:   Review of systems completed with pertinent positives/negatives outlined in above HPI.  Past Medical History:  She,  has a past medical history of Chronic diastolic CHF (congestive heart failure) (HCC) (01/05/2022), DVT (deep vein thrombosis) in pregnancy, Dyspnea, Essential hypertension (08/25/2017), Hyperlipidemia (11/21/2021), Morbid obesity (HCC), Obesity hypoventilation syndrome (HCC) (05/03/2021), PE (pulmonary embolism) (02/29/2016), Sleep apnea, and Tracheostomy present (HCC) (01/05/2022).   Surgical History:   Past Surgical History:  Procedure Laterality Date   CESAREAN SECTION     x 3   OPEN REDUCTION INTERNAL FIXATION (ORIF) DISTAL RADIAL FRACTURE Right 06/20/2019   Procedure: OPEN REDUCTION INTERNAL FIXATION (ORIF) DISTAL RADIAL FRACTURE;  Surgeon: Mack Hook, MD;  Location: [facility] OR;  Service: Orthopedics;  Laterality: Right;  REGIONAL BLOCK TOTAL SURGERY TIME: 90 MINUTES   SYNOVECTOMY Right 11/21/2019   Procedure: RIGHT WRIST LIGAMENT RECONSTRUCTION;  Surgeon: Mack Hook, MD;  Location: [facility] OR;  Service: Orthopedics;  Laterality: Right;  PROCEDURE: RIGHT WRIST LIGAMENT RECONSTRUCTION LENGTH OF SURGERY: 105 MIN    Social History:   reports that she has quit smoking. She has never used smokeless tobacco. She reports that she does not drink alcohol and does not use drugs.   Family History:  Her family  history includes Hypertension in her sister.   Allergies: No Known Allergies   Home Medications: Prior to Admission medications   Medication Sig Start Date End Date Taking? Authorizing Provider  amLODipine (NORVASC) 5 MG tablet Take 1 tablet (5 mg total) by mouth daily. 06/17/23  Yes Claiborne Rigg, NP  apixaban (ELIQUIS) 5 MG TABS tablet Take 1 tablet (5 mg total) by mouth 2 (two) times daily. 06/17/23  Yes Claiborne Rigg, NP  atorvastatin (LIPITOR) 20 MG tablet Take 1 tablet (20 mg total) by mouth daily. 06/17/23  Yes Claiborne Rigg, NP  dapagliflozin propanediol (FARXIGA) 10 MG TABS tablet Take 1 tablet (  10 mg total) by mouth daily. 06/17/23  Yes Claiborne Rigg, NP  furosemide (LASIX) 20 MG tablet Take 1 tablet (20 mg total) by mouth daily. Take 40 mg or 2 tablets for the next 5 days then resume 20 mg or 1 tablet by mouth daily by mouth. 06/17/23  Yes Claiborne Rigg, NP  PRESCRIPTION MEDICATION 1 each by Other route at bedtime. CPAP- At bedtime   Yes [provider]  spironolactone (ALDACTONE) 25 MG tablet Take 1 tablet (25 mg total) by mouth daily. 06/17/23  Yes Claiborne Rigg, NP  Misc. Devices MISC Please supply patient with tracheostomy humidifier  Z93.0 03/25/22   Claiborne Rigg, NP    Critical care time:   The patient is critically ill with multiple organ system failure and requires high complexity decision making for assessment and support, frequent evaluation and titration of therapies, advanced monitoring, review of radiographic studies and interpretation of complex data.   Critical Care Time devoted to patient care services, exclusive of separately billable procedures, described in this note is 38 minutes.  Tim Lair, PA-C [facility] 10/23/23 5:22 AM  Please see [email or web address] for pager details.  From 7A-7P if no response, please call [phone] After hours, please call ELink [phone]

## 2023-10-23 NOTE — Procedures (Signed)
 Tracheostomy Change Note  Patient Details:   Name: Angel Brewer DOB: 1966-07-14 MRN: [id number]    Airway Documentation:     Evaluation  O2 sats: stable throughout Complications: No apparent complications Patient did tolerate procedure well. Bilateral Breath Sounds: Diminished   RT x2 at bedside during tracheotomy exchange. Pt trach changed from 6SHF cuffless to 6SHF cuffed, and pt placed on ventilator. Positive color change noted, VSS throughout.   Paschal Dopp 10/23/2023, 3:09 AM

## 2023-10-23 NOTE — Evaluation (Addendum)
 Passy-Muir Speaking Valve - Evaluation Patient Details  Name: Angel Brewer MRN: [id number] Date of Birth: 10-14-1965  Today's Date: 10/23/2023 Time: 4098-1191 SLP Time Calculation (min) (ACUTE ONLY): 25 min  Past Medical History:  Past Medical History:  Diagnosis Date   Chronic diastolic CHF (congestive heart failure) (HCC) 01/05/2022   DVT (deep vein thrombosis) in pregnancy    RLE DVT 02/2016   Dyspnea    Essential hypertension 08/25/2017   Hyperlipidemia 11/21/2021   Morbid obesity (HCC)    Obesity hypoventilation syndrome (HCC) 05/03/2021   PE (pulmonary embolism) 02/29/2016   Sleep apnea    uses a cpap   Tracheostomy present (HCC) 01/05/2022   Size 6 shiley/cuffless  Last changed: 10/9     Discussion  Stable trach.  She does get a [place] more short of breath with exertion.  Although I did not observe it on my exam she does endorse she will occasionally have a rush of air after removing PMV following activity.  This could suggest some mild air trapping from the trach.  Otherwise she is doing fantastic with the trach     Plan  Cont routin   Past Surgical History:  Past Surgical History:  Procedure Laterality Date   CESAREAN SECTION     x 3   OPEN REDUCTION INTERNAL FIXATION (ORIF) DISTAL RADIAL FRACTURE Right 06/20/2019   Procedure: OPEN REDUCTION INTERNAL FIXATION (ORIF) DISTAL RADIAL FRACTURE;  Surgeon: Mack Hook, MD;  Location: [facility] OR;  Service: Orthopedics;  Laterality: Right;  REGIONAL BLOCK TOTAL SURGERY TIME: 90 MINUTES   SYNOVECTOMY Right 11/21/2019   Procedure: RIGHT WRIST LIGAMENT RECONSTRUCTION;  Surgeon: Mack Hook, MD;  Location: [facility] OR;  Service: Orthopedics;  Laterality: Right;  PROCEDURE: RIGHT WRIST LIGAMENT RECONSTRUCTION LENGTH OF SURGERY: 105 MIN   HPI:  Patient is a 58 year old F who presented to the ED after a fall in her home resulting in L1 fx. PMH CHF, obesity hypoventilation syndrome s/p trach, OSA, h/o PE on Eliquis. Placed on trach  collar today. (4/11)    Assessment / Plan / Recommendation  Clinical Impression  Patient placed on trach collar from vent this afternoon. Patient with tracheostomy PTA and used PMSV at home, however with cuffed trach this admission. Upon entrance, respiratory placing PMSV. Patient with adequate phonation and WFL vital signs. Upon doffing PMSV, patient with mild back pressure which may indicate breath stacking, although with no change in WOB nor discomfort from patient. At this time, patient is unable to don/doff PMSV independently due to physical limitations, therefore recommend wearing PMSV with full supervision from nursing and/or therapy to reduce risk of back pressure and ensure valve can be removed as needed. PMSV removed after 20 minutes of full supervision from SLP.  SLP Visit Diagnosis: Aphonia (R49.1)    SLP Assessment  Patient needs continued Speech Lanaguage Pathology Services    Recommendations for follow up therapy are one component of a multi-disciplinary discharge planning process, led by the attending physician.  Recommendations may be updated based on patient status, additional functional criteria and insurance authorization.  Follow Up Recommendations  Home health SLP    Assistance Recommended at Discharge Intermittent Supervision/Assistance  Functional Status Assessment Patient has had a recent decline in their functional status and demonstrates the ability to make significant improvements in function in a reasonable and predictable amount of time.  Frequency and Duration min 2x/week  2 weeks    PMSV Trial PMSV was placed for: 20 minutes Able to redirect subglottic  air through upper airway: Yes Able to Attain Phonation: Yes Voice Quality: Normal Level of Secretion Expectoration with PMSV: Not observed Breath Support for Phonation: Adequate Intelligibility: Intelligible SpO2 During Trial: 96 % Pulse During Trial: 83 Behavior: Alert;Expresses self well;Cooperative    Tracheostomy Tube       Vent Dependency  Vent Mode: PSV PEEP: 5 cmH20 Pressure Support: 5 cmH20 FiO2 (%): 35 %    Cuff Deflation Trial Tolerated Cuff Deflation: Yes Length of Time for Cuff Deflation Trial: 20 Behavior: Alert;Good eye contact;Cooperative Cuff Deflation Trial - Comments: cuff deflated just before SLP entered room. Tolerating well         Hanson Medeiros M.A., CCC-SLP 10/23/2023, 4:37 PM

## 2023-10-23 NOTE — Progress Notes (Addendum)
 [facility] Progress Note     Subjective: CC:  Denies pain at rest. Some pain in left clavicle with movement. Reports joint arthritis and denies lower extremity pain. Confirms that she lives along and mobilizes without an assistive device at baseline. Says she fell yesterday at 10 AM in the bathroom. She was unable to get up and fortunately her daughter called her and then was able to notify EMS. Last dose Eliquis 4/10 AM.   Family: brother, sister, and niece locally. Daughter in [place]. States she does not see these family members, they just check on her over the phone.   Objective: Vital signs in last 24 hours: Temp:  [97.6 F (36.4 C)-98.8 F (37.1 C)] 98.3 F (36.8 C) (04/11 0700) Pulse Rate:  [63-96] 64 (04/11 0700) Resp:  [15-27] 24 (04/11 0800) BP: (103-165)/(60-118) 119/64 (04/11 0800) SpO2:  [90 %-100 %] 100 % (04/11 0700) FiO2 (%):  [40 %-50 %] 40 % (04/11 0249) Weight:  [178 kg] 178 kg (04/10 2044)    Intake/Output from previous day: No intake/output data recorded. Intake/Output this shift: No intake/output data recorded.  PE: Gen:  Alert, NAD, pleasant, chronically ill appearing HEENT: trach in place, no signs of infection, trachea is midline Card:  Regular rate and rhythm, pedal pulses 2+ BL Pulm:  Normal effort on vent (40%/5 PEEP), CTAB anteriorly Abd: Soft, non-tender, non-distended MSK: mild tenderness and swelling over R clavicle Nontender LLE, some knee pain with passive ROM Skin: warm and dry, no rashes  Psych: A&Ox3   Lab Results:  Recent Labs    10/22/23 2108 10/23/23 0207 10/23/23 0408 10/23/23 0519  WBC 9.8  --  8.5  --   HGB 13.4   < > 12.5 13.9  HCT 44.7   < > 42.2 41.0  PLT 236  --  205  --    < > = values in this interval not displayed.   BMET Recent Labs    10/22/23 2108 10/23/23 0207 10/23/23 0408 10/23/23 0519  NA 138   < > 140 140  K 4.5   < > 4.6 4.4  CL 97*  --  99  --   CO2 30  --  33*  --   GLUCOSE 104*   --  101*  --   BUN 17  --  17  --   CREATININE 0.96  --  0.98  --   CALCIUM 9.1  --  8.7*  --    < > = values in this interval not displayed.   PT/INR Recent Labs    10/22/23 2108  LABPROT 14.7  INR 1.1   CMP     Component Value Date/Time   NA 140 10/23/2023 0519   NA 143 06/17/2023 1112   K 4.4 10/23/2023 0519   CL 99 10/23/2023 0408   CO2 33 (H) 10/23/2023 0408   GLUCOSE 101 (H) 10/23/2023 0408   BUN 17 10/23/2023 0408   BUN 13 06/17/2023 1112   CREATININE 0.98 10/23/2023 0408   CREATININE 0.81 11/14/2019 1022   CREATININE 0.96 09/09/2016 0911   CALCIUM 8.7 (L) 10/23/2023 0408   PROT 6.6 10/23/2023 0408   PROT 7.8 06/17/2023 1112   ALBUMIN 2.9 (L) 10/23/2023 0408   ALBUMIN 4.1 06/17/2023 1112   AST 23 10/23/2023 0408   AST 13 (L) 11/14/2019 1022   ALT 11 10/23/2023 0408   ALT 10 11/14/2019 1022   ALKPHOS 52 10/23/2023 0408   BILITOT 1.5 (H) 10/23/2023 0408  BILITOT 0.7 06/17/2023 1112   BILITOT 0.8 11/14/2019 1022   GFRNONAA >60 10/23/2023 0408   GFRNONAA >60 11/14/2019 1022   GFRNONAA 69 09/09/2016 0911   GFRAA 68 09/07/2020 0900   GFRAA >60 11/14/2019 1022   GFRAA 79 09/09/2016 0911   Lipase  No results found for: "LIPASE"     Studies/Results: DG CHEST PORT 1 VIEW Result Date: 10/23/2023 CLINICAL DATA:  Tracheostomy EXAM: PORTABLE CHEST 1 VIEW COMPARISON:  10/22/2023, 09/03/2023, 09/02/2023 FINDINGS: Tracheostomy tube tip is about 4.2 cm superior to the carina. Cardiomegaly with vascular congestion. Interim finding of bilateral mid to lower lung patchy heterogeneous airspace disease. No pneumothorax IMPRESSION: 1. Tracheostomy tube tip about 4.2 cm superior to the carina. 2. Cardiomegaly with vascular congestion. 3. Interim finding of bilateral mid to lower lung patchy heterogeneous airspace disease, question pneumonia or possible hemorrhage given previous history of fall on thinners. Electronically Signed   By: Jasmine Pang M.D.   On: 10/23/2023 03:46    DG Shoulder Left Result Date: 10/23/2023 CLINICAL DATA:  Pain after fall EXAM: LEFT SHOULDER - 2+ VIEW COMPARISON:  None Available. FINDINGS: Mild AC joint and glenohumeral degenerative change. No definitive fracture or malalignment. IMPRESSION: Mild degenerative change. Electronically Signed   By: Jasmine Pang M.D.   On: 10/23/2023 00:44   DG Shoulder 1 View Right Result Date: 10/23/2023 CLINICAL DATA:  Pain after fall EXAM: RIGHT SHOULDER - 1 VIEW COMPARISON:  None Available. FINDINGS: Mild AC joint and glenohumeral degenerative change. No definitive fracture or malalignment. IMPRESSION: Mild degenerative change. Electronically Signed   By: Jasmine Pang M.D.   On: 10/23/2023 00:44   CT Knee Left Wo Contrast Result Date: 10/23/2023 CLINICAL DATA:  Knee trauma tibial plateau fracture EXAM: CT OF THE LEFT KNEE WITHOUT CONTRAST TECHNIQUE: Multidetector CT imaging of the left knee was performed according to the standard protocol. Multiplanar CT image reconstructions were also generated. RADIATION DOSE REDUCTION: This exam was performed according to the departmental dose-optimization program which includes automated exposure control, adjustment of the mA and/or kV according to patient size and/or use of iterative reconstruction technique. COMPARISON:  Radiographs 10/22/2023 FINDINGS: Bones/Joint/Cartilage Small knee effusion. No significant lipohemarthrosis. Mild lateral deviation of the patella. Small foci of gas in the subpatellar joint space. Overall grainy image quality, likely due to habitus. No definitive acute fracture seen. Minimal deformity with sclerosis on sagittal series 10, image 57, could be due to remote injury. 4 mm intra-articular calcification slightly peripheral to the tibial spines could reflect small loose body. Tricompartment arthritis with moderate severe medial tibiofemoral joint space narrowing, prominent spurring and subarticular sclerosis and cyst formation. Moderate  patellofemoral degenerative changes, worse on the lateral side. Ligaments Suboptimally assessed by CT. Muscles and Tendons No intramuscular fluid collections. Quadriceps tendon and patellar tendon appear grossly intact. Soft tissues Mild soft tissue swelling. IMPRESSION: 1. Limited secondary to body habitus and overall grainy appearance of images. No definitive acute fracture is seen. Trace joint effusion without evidence for lipohemarthrosis. Possible small calcified loose body within the anterior tibiofemoral joint space near the tibial spines 2. Small foci of gas in the subpatellar joint space, correlate for any recent intervention. 3. Tricompartment arthritis, moderate to severe in the medial tibiofemoral compartment. Electronically Signed   By: Jasmine Pang M.D.   On: 10/23/2023 00:43   DG Humerus Left Result Date: 10/23/2023 CLINICAL DATA:  Pain after fall EXAM: LEFT HUMERUS - 2+ VIEW COMPARISON:  None Available. FINDINGS: There is no evidence of fracture  or other focal bone lesions. Soft tissues are unremarkable. IMPRESSION: Negative. Electronically Signed   By: Jasmine Pang M.D.   On: 10/23/2023 00:25   DG Femur 1 View Left Result Date: 10/22/2023 CLINICAL DATA:  [id number] Fall [id number]; pain EXAM: PORTABLE LEFT KNEE - 1-2 VIEW; LEFT TIBIA AND FIBULA - 2 VIEW; LEFT FEMUR 1 VIEW; PORTABLE LEFT ANKLE - 2 VIEW COMPARISON:  None Available. FINDINGS: Acute displaced tibial plateau intercondylar fracture. No dislocation. Limited evaluation for joint effusion/lipohemarthrosis due to collimation of view and only frontal view of the femur. Severe tricompartmental degenerative changes of the knee. Soft tissues are unremarkable. IMPRESSION: 1. Acute displaced tibial plateau intercondylar fracture. 2. Limited evaluation for joint effusion/lipohemarthrosis due to collimation of view and only frontal view of the femur. 3. Severe tricompartmental degenerative changes of the knee. 4. No other acute fracture or  dislocation of the left femur, knee, tibia fibula, ankle. Electronically Signed   By: Tish Frederickson M.D.   On: 10/22/2023 23:13   DG Tibia/Fibula Left Result Date: 10/22/2023 CLINICAL DATA:  [id number] Fall [id number]; pain EXAM: PORTABLE LEFT KNEE - 1-2 VIEW; LEFT TIBIA AND FIBULA - 2 VIEW; LEFT FEMUR 1 VIEW; PORTABLE LEFT ANKLE - 2 VIEW COMPARISON:  None Available. FINDINGS: Acute displaced tibial plateau intercondylar fracture. No dislocation. Limited evaluation for joint effusion/lipohemarthrosis due to collimation of view and only frontal view of the femur. Severe tricompartmental degenerative changes of the knee. Soft tissues are unremarkable. IMPRESSION: 1. Acute displaced tibial plateau intercondylar fracture. 2. Limited evaluation for joint effusion/lipohemarthrosis due to collimation of view and only frontal view of the femur. 3. Severe tricompartmental degenerative changes of the knee. 4. No other acute fracture or dislocation of the left femur, knee, tibia fibula, ankle. Electronically Signed   By: Tish Frederickson M.D.   On: 10/22/2023 23:13   DG Ankle Left Port Result Date: 10/22/2023 CLINICAL DATA:  [id number] Fall [id number]; pain EXAM: PORTABLE LEFT KNEE - 1-2 VIEW; LEFT TIBIA AND FIBULA - 2 VIEW; LEFT FEMUR 1 VIEW; PORTABLE LEFT ANKLE - 2 VIEW COMPARISON:  None Available. FINDINGS: Acute displaced tibial plateau intercondylar fracture. No dislocation. Limited evaluation for joint effusion/lipohemarthrosis due to collimation of view and only frontal view of the femur. Severe tricompartmental degenerative changes of the knee. Soft tissues are unremarkable. IMPRESSION: 1. Acute displaced tibial plateau intercondylar fracture. 2. Limited evaluation for joint effusion/lipohemarthrosis due to collimation of view and only frontal view of the femur. 3. Severe tricompartmental degenerative changes of the knee. 4. No other acute fracture or dislocation of the left femur, knee, tibia fibula, ankle. Electronically  Signed   By: Tish Frederickson M.D.   On: 10/22/2023 23:13   DG Knee Left Port Result Date: 10/22/2023 CLINICAL DATA:  [id number] Fall [id number]; pain EXAM: PORTABLE LEFT KNEE - 1-2 VIEW; LEFT TIBIA AND FIBULA - 2 VIEW; LEFT FEMUR 1 VIEW; PORTABLE LEFT ANKLE - 2 VIEW COMPARISON:  None Available. FINDINGS: Acute displaced tibial plateau intercondylar fracture. No dislocation. Limited evaluation for joint effusion/lipohemarthrosis due to collimation of view and only frontal view of the femur. Severe tricompartmental degenerative changes of the knee. Soft tissues are unremarkable. IMPRESSION: 1. Acute displaced tibial plateau intercondylar fracture. 2. Limited evaluation for joint effusion/lipohemarthrosis due to collimation of view and only frontal view of the femur. 3. Severe tricompartmental degenerative changes of the knee. 4. No other acute fracture or dislocation of the left femur, knee, tibia fibula, ankle. Electronically Signed   By: Normajean Glasgow.D.  On: 10/22/2023 23:13   DG Pelvis Portable Result Date: 10/22/2023 CLINICAL DATA:  Trauma EXAM: PORTABLE PELVIS 1-2 VIEWS COMPARISON:  CT abdomen pelvis 10/22/2023 FINDINGS: Limited evaluation due to overlapping osseous structures and overlying soft tissues. There is no evidence of pelvic fracture or diastasis. No acute displaced fracture or dislocation of either hips on frontal view. No pelvic bone lesions are seen. IMPRESSION: Negative for acute traumatic injury. Electronically Signed   By: Tish Frederickson M.D.   On: 10/22/2023 22:58   DG Chest Port 1 View Result Date: 10/22/2023 CLINICAL DATA:  Trauma fall. EXAM: PORTABLE CHEST 1 VIEW COMPARISON:  CT chest 09/02/2023 FINDINGS: Tracheostomy tube terminates 8 cm above the carina. Cardiomegaly.  The heart and mediastinal contours are unchanged. No focal consolidation. No pulmonary edema. No pleural effusion. No pneumothorax. No acute osseous abnormality. IMPRESSION: No active disease. Electronically Signed    By: Tish Frederickson M.D.   On: 10/22/2023 22:56   CT HEAD WO CONTRAST Result Date: 10/22/2023 CLINICAL DATA:  Head trauma, moderate-severe; Polytrauma, blunt EXAM: CT HEAD WITHOUT CONTRAST CT CERVICAL SPINE WITHOUT CONTRAST TECHNIQUE: Multidetector CT imaging of the head and cervical spine was performed following the standard protocol without intravenous contrast. Multiplanar CT image reconstructions of the cervical spine were also generated. RADIATION DOSE REDUCTION: This exam was performed according to the departmental dose-optimization program which includes automated exposure control, adjustment of the mA and/or kV according to patient size and/or use of iterative reconstruction technique. COMPARISON:  CT head 08/19/2020 FINDINGS: CT HEAD FINDINGS Brain: No evidence of large-territorial acute infarction. No parenchymal hemorrhage. No mass lesion. No extra-axial collection. No mass effect or midline shift. No hydrocephalus. Basilar cisterns are patent. Vascular: No hyperdense vessel. Skull: No acute fracture or focal lesion. Sinuses/Orbits: Paranasal sinuses and mastoid air cells are clear. The orbits are unremarkable. Other: None. CT CERVICAL SPINE FINDINGS Alignment: Normal. Skull base and vertebrae: Limited evaluation due to quantum mottle artifact. No acute fracture. No aggressive appearing focal osseous lesion or focal pathologic process. Soft tissues and spinal canal: No prevertebral fluid or swelling. No visible canal hematoma. Upper chest: Unremarkable. Other: Tracheostomy tube tip 7 cm above the carina. IMPRESSION: 1. No acute intracranial abnormality. 2. No acute displaced fracture or traumatic listhesis of the cervical spine. Limited evaluation due to body habitus. Electronically Signed   By: Tish Frederickson M.D.   On: 10/22/2023 22:55   CT CERVICAL SPINE WO CONTRAST Result Date: 10/22/2023 CLINICAL DATA:  Head trauma, moderate-severe; Polytrauma, blunt EXAM: CT HEAD WITHOUT CONTRAST CT CERVICAL  SPINE WITHOUT CONTRAST TECHNIQUE: Multidetector CT imaging of the head and cervical spine was performed following the standard protocol without intravenous contrast. Multiplanar CT image reconstructions of the cervical spine were also generated. RADIATION DOSE REDUCTION: This exam was performed according to the departmental dose-optimization program which includes automated exposure control, adjustment of the mA and/or kV according to patient size and/or use of iterative reconstruction technique. COMPARISON:  CT head 08/19/2020 FINDINGS: CT HEAD FINDINGS Brain: No evidence of large-territorial acute infarction. No parenchymal hemorrhage. No mass lesion. No extra-axial collection. No mass effect or midline shift. No hydrocephalus. Basilar cisterns are patent. Vascular: No hyperdense vessel. Skull: No acute fracture or focal lesion. Sinuses/Orbits: Paranasal sinuses and mastoid air cells are clear. The orbits are unremarkable. Other: None. CT CERVICAL SPINE FINDINGS Alignment: Normal. Skull base and vertebrae: Limited evaluation due to quantum mottle artifact. No acute fracture. No aggressive appearing focal osseous lesion or focal pathologic process. Soft tissues and spinal  canal: No prevertebral fluid or swelling. No visible canal hematoma. Upper chest: Unremarkable. Other: Tracheostomy tube tip 7 cm above the carina. IMPRESSION: 1. No acute intracranial abnormality. 2. No acute displaced fracture or traumatic listhesis of the cervical spine. Limited evaluation due to body habitus. Electronically Signed   By: Tish Frederickson M.D.   On: 10/22/2023 22:55   CT CHEST ABDOMEN PELVIS W CONTRAST Result Date: 10/22/2023 CLINICAL DATA:  Trauma, fall on blood thinners EXAM: CT CHEST, ABDOMEN, AND PELVIS WITH CONTRAST TECHNIQUE: Multidetector CT imaging of the chest, abdomen and pelvis was performed following the standard protocol during bolus administration of intravenous contrast. RADIATION DOSE REDUCTION: This exam was  performed according to the departmental dose-optimization program which includes automated exposure control, adjustment of the mA and/or kV according to patient size and/or use of iterative reconstruction technique. CONTRAST:  65mL OMNIPAQUE IOHEXOL 350 MG/ML SOLN COMPARISON:  CT chest angiogram, 09/02/2023 FINDINGS: CT CHEST FINDINGS Cardiovascular: Aortic atherosclerosis. Cardiomegaly. No pericardial effusion. Mediastinum/Nodes: No enlarged mediastinal, hilar, or axillary lymph nodes. Thyroid gland, trachea, and esophagus demonstrate no significant findings. Lungs/Pleura: Tracheostomy. Mild bibasilar scarring or atelectasis and diffuse mosaic attenuation of the airspaces, suggesting small airways disease. No pleural effusion or pneumothorax. Musculoskeletal: Soft tissue contusion and fat stranding involving the right sternocleidomastoid muscle and overlying superficial subcutaneous soft tissues (series 3, image 6). Nondisplaced fractures of the right clavicular head (series 6, image 74). No dislocation of the sternoclavicular joint. Suspect nondisplaced fractures of the proximal left humerus, incompletely imaged and suboptimally assessed (series 6, image 65). CT ABDOMEN PELVIS FINDINGS Hepatobiliary: Hepatomegaly, maximum coronal span 20.5 cm. No solid liver abnormality is seen. No gallstones, gallbladder wall thickening, or biliary dilatation. Pancreas: Unremarkable. No pancreatic ductal dilatation or surrounding inflammatory changes. Spleen: Normal in size without significant abnormality. Adrenals/Urinary Tract: Unchanged, definitively benign right adrenal adenoma, requiring no further follow-up or characterization. Kidneys are normal, without renal calculi, solid lesion, or hydronephrosis. Bladder is unremarkable. Stomach/Bowel: Stomach is within normal limits. Appendix appears normal. No evidence of bowel wall thickening, distention, or inflammatory changes. Vascular/Lymphatic: Multiple small left ovarian vein  phleboliths, which do not reflect urinary tract calculi. No enlarged abdominal or pelvic lymph nodes. Reproductive: No mass or other abnormality. Other: Small, fat containing umbilical hernia.  No ascites. Musculoskeletal: New, likely acute superior endplate deformity of L1 without significant anterior height loss (series 7, image 64). Disc degenerative disease and bridging osteophytosis throughout the thoracic spine. IMPRESSION: 1. New, likely acute superior endplate deformity of L1 without significant anterior height loss. 2. Nondisplaced fractures of the right clavicular head. No dislocation of the sternoclavicular joint. Soft tissue contusion and fat stranding involving the right sternocleidomastoid muscle and overlying superficial subcutaneous soft tissues. 3. Suspect nondisplaced fractures of the proximal left humerus, incompletely imaged and suboptimally assessed. 4. No CT evidence of organ injury to the chest, abdomen, or pelvis. 5. Tracheostomy. 6. Cardiomegaly. Aortic Atherosclerosis (ICD10-I70.0). Electronically Signed   By: Jearld Lesch M.D.   On: 10/22/2023 22:10    Anti-infectives: Anti-infectives (From admission, onward)    None        Assessment/Plan  Angel Brewer is an 58 y.o. female S/p fall Superior endplate fracture of L1 - NS consult pending Right clavicle fracture with surrounding soft tissue edema- ortho c/s Dr. Hulda Humphrey, Non-op mgmt, [facility] - WB for transfers ok Left tibial plateau fracture - ortho c/s, Dr. Hulda Humphrey, Non-op mgmt WBAT Severe obesity OSA/obesity hypoventilation syndrome Tracheostomy dependent Chronic diastolic heart failure with preserved ejection fraction  History of VTE on Eliquis   No additional traumatic injuries identified on tertiary survey. Hgb overall stable (12.5 from 13.4) on heparin gtt. Trauma surgery will sign off. Call as needed.     LOS: 0 days   I reviewed nursing notes, Consultant ortho notes, hospitalist notes, last 24 h vitals and  pain scores, last 48 h intake and output, last 24 h labs and trends, and last 24 h imaging results.  This care required moderate level of medical decision making.   Hosie Spangle, PA-C [facility] Please see Amion for pager number during day hours 7:00am-4:30pm

## 2023-10-23 NOTE — Consult Note (Signed)
 ORTHOPAEDIC CONSULTATION  REQUESTING PHYSICIAN: Lynnell Catalan, MD  Chief Complaint: Left knee and right shoulder pain  HPI: Angel Brewer is a 58 y.o. female with medical history significant of chronic hypoxic hypercarbic respiratory failure tracheostomy dependent since May 2023, heart failure preserved EF, history of OSA on CPAP at bedtime and OSH, PE and DVT on Eliquis, and morbid obesity class III presented to emergency department complaining of mechanical fall.  Patient states she fell off the toilet last unable to get up she is currently complaining of right shoulder pain.  History is difficult to obtain patient is communication ability with tracheostomy  Past Medical History:  Diagnosis Date   Chronic diastolic CHF (congestive heart failure) (HCC) 01/05/2022   DVT (deep vein thrombosis) in pregnancy    RLE DVT 02/2016   Dyspnea    Essential hypertension 08/25/2017   Hyperlipidemia 11/21/2021   Morbid obesity (HCC)    Obesity hypoventilation syndrome (HCC) 05/03/2021   PE (pulmonary embolism) 02/29/2016   Sleep apnea    uses a cpap   Tracheostomy present (HCC) 01/05/2022   Size 6 shiley/cuffless  Last changed: 10/9     Discussion  Stable trach.  She does get a [place] more short of breath with exertion.  Although I did not observe it on my exam she does endorse she will occasionally have a rush of air after removing PMV following activity.  This could suggest some mild air trapping from the trach.  Otherwise she is doing fantastic with the trach     Plan  Cont routin   Past Surgical History:  Procedure Laterality Date   CESAREAN SECTION     x 3   OPEN REDUCTION INTERNAL FIXATION (ORIF) DISTAL RADIAL FRACTURE Right 06/20/2019   Procedure: OPEN REDUCTION INTERNAL FIXATION (ORIF) DISTAL RADIAL FRACTURE;  Surgeon: Mack Hook, MD;  Location: [facility] OR;  Service: Orthopedics;  Laterality: Right;  REGIONAL BLOCK TOTAL SURGERY TIME: 90 MINUTES   SYNOVECTOMY Right 11/21/2019    Procedure: RIGHT WRIST LIGAMENT RECONSTRUCTION;  Surgeon: Mack Hook, MD;  Location: [facility] OR;  Service: Orthopedics;  Laterality: Right;  PROCEDURE: RIGHT WRIST LIGAMENT RECONSTRUCTION LENGTH OF SURGERY: 105 MIN   Social History   Socioeconomic History   Marital status: Single    Spouse name: Not on file   Number of children: 3   Years of education: Not on file   Highest education level: Some college, no degree  Occupational History   Not on file  Tobacco Use   Smoking status: Former   Smokeless tobacco: Never   Tobacco comments:    smoked only as a teenager  Vaping Use   Vaping status: Never Used  Substance and Sexual Activity   Alcohol use: No   Drug use: No   Sexual activity: Not Currently  Other Topics Concern   Not on file  Social History Narrative   Not on file   Social Drivers of Health   Financial Resource Strain: Medium Risk (06/16/2023)   Overall Financial Resource Strain (CARDIA)    Difficulty of Paying Living Expenses: Somewhat hard  Food Insecurity: Patient Declined (09/03/2023)   Hunger Vital Sign    Worried About Running Out of Food in the Last Year: Patient declined    Ran Out of Food in the Last Year: Patient declined  Recent Concern: Food Insecurity - Food Insecurity Present (06/16/2023)   Hunger Vital Sign    Worried About Running Out of Food in the Last Year: Sometimes true  Ran Out of Food in the Last Year: Sometimes true  Transportation Needs: Patient Declined (09/03/2023)   PRAPARE - Transportation    Lack of Transportation (Medical): Patient declined    Lack of Transportation (Non-Medical): Patient declined  Physical Activity: Sufficiently Active (06/16/2023)   Exercise Vital Sign    Days of Exercise per Week: 5 days    Minutes of Exercise per Session: 30 min  Stress: No Stress Concern Present (06/16/2023)   [employer] of Occupational Health - Occupational Stress Questionnaire    Feeling of Stress : Not at all  Social Connections:  Socially Isolated (06/16/2023)   Social Connection and Isolation Panel [NHANES]    Frequency of Communication with Friends and Family: More than three times a week    Frequency of Social Gatherings with Friends and Family: Three times a week    Attends Religious Services: Never    Active Member of Clubs or Organizations: No    Attends Engineer, structural: Not on file    Marital Status: Never married   Family History  Problem Relation Age of Onset   Hypertension Sister    No Known Allergies Prior to Admission medications   Medication Sig Start Date End Date Taking? Authorizing Provider  amLODipine (NORVASC) 5 MG tablet Take 1 tablet (5 mg total) by mouth daily. 06/17/23  Yes Claiborne Rigg, NP  apixaban (ELIQUIS) 5 MG TABS tablet Take 1 tablet (5 mg total) by mouth 2 (two) times daily. 06/17/23  Yes Claiborne Rigg, NP  atorvastatin (LIPITOR) 20 MG tablet Take 1 tablet (20 mg total) by mouth daily. 06/17/23  Yes Claiborne Rigg, NP  dapagliflozin propanediol (FARXIGA) 10 MG TABS tablet Take 1 tablet (10 mg total) by mouth daily. 06/17/23  Yes Claiborne Rigg, NP  furosemide (LASIX) 20 MG tablet Take 1 tablet (20 mg total) by mouth daily. Take 40 mg or 2 tablets for the next 5 days then resume 20 mg or 1 tablet by mouth daily by mouth. 06/17/23  Yes Claiborne Rigg, NP  PRESCRIPTION MEDICATION 1 each by Other route at bedtime. CPAP- At bedtime   Yes [provider]  spironolactone (ALDACTONE) 25 MG tablet Take 1 tablet (25 mg total) by mouth daily. 06/17/23  Yes Claiborne Rigg, NP  Misc. Devices MISC Please supply patient with tracheostomy humidifier  Z93.0 03/25/22   Claiborne Rigg, NP    Family History Reviewed and non-contributory, no pertinent history of problems with bleeding or anesthesia      Review of Systems 14 system ROS conducted and negative except for that noted in HPI   OBJECTIVE  Vitals:Patient Vitals for the past 8 hrs:  BP Temp Temp src Pulse  Resp SpO2  10/23/23 0800 119/64 -- -- -- (!) 24 --  10/23/23 0700 130/84 98.3 F (36.8 C) -- 64 (!) 22 100 %  10/23/23 0600 137/62 -- -- 86 (!) 21 100 %  10/23/23 0500 137/73 -- -- 70 (!) 22 97 %  10/23/23 0430 104/85 -- -- 64 (!) 22 98 %  10/23/23 0419 -- 97.6 F (36.4 C) Oral -- -- --  10/23/23 0400 103/64 -- -- 64 (!) 22 98 %  10/23/23 0330 110/60 -- -- 63 (!) 22 100 %  10/23/23 0300 131/69 -- -- 75 (!) 22 99 %  10/23/23 0230 (!) 140/72 -- -- 88 20 99 %  10/23/23 0215 -- -- -- 85 (!) 23 93 %  10/23/23 0210 (!) 156/65 -- --  71 20 --  10/23/23 0130 129/68 -- -- 83 19 96 %  10/23/23 0100 127/79 -- -- 87 (!) 21 98 %  10/23/23 0031 -- 98.8 F (37.1 C) Oral -- -- --  10/23/23 0030 135/69 -- -- 83 (!) 21 96 %   General: Obese, NAD Cardiovascular: Warm extremities noted Respiratory: Tracheostomy present, no increased effort GI: Abdomen obese, umbilical hernia Skin: No lesions in the area of chief complaint other than those listed below in MSK exam.  Neurologic: Sensation intact distally save for the below mentioned MSK exam Psychiatric: Patient is competent for consent with normal mood and affect Lymphatic: No swelling obvious and reported other than the area involved in the exam below Extremities  RUE: Skin intact no open wounds or lesions No deformity appreciated Tender to palpation along medial clavicle Nontender over rest of extremity Pain with shoulder range of motion Full range of motion without pain of elbow, wrist and digits Motor and sensory intact distally Palpable radial pulse  LUE: Skin intact no open wounds or lesions No deformity appreciated No bony tenderness in extremity No pain with ROM of shoulder, elbow, wrist and digits Motor and sensory intact distally Palpable radial pulse  LLE: Skin intact no open wounds or lesions No deformity appreciated There is to palpation globally around the knee Pain with any knee range of motion Difficult to assess if  there is an effusion due to body habitus Motor intact EHL/FHL Sensation intact SP/DP/T Palpable DP pulse  Test Results Imaging DG CHEST PORT 1 VIEW Result Date: 10/23/2023 CLINICAL DATA:  Tracheostomy EXAM: PORTABLE CHEST 1 VIEW COMPARISON:  10/22/2023, 09/03/2023, 09/02/2023 FINDINGS: Tracheostomy tube tip is about 4.2 cm superior to the carina. Cardiomegaly with vascular congestion. Interim finding of bilateral mid to lower lung patchy heterogeneous airspace disease. No pneumothorax IMPRESSION: 1. Tracheostomy tube tip about 4.2 cm superior to the carina. 2. Cardiomegaly with vascular congestion. 3. Interim finding of bilateral mid to lower lung patchy heterogeneous airspace disease, question pneumonia or possible hemorrhage given previous history of fall on thinners. Electronically Signed   By: Jasmine Pang M.D.   On: 10/23/2023 03:46   DG Shoulder Left Result Date: 10/23/2023 CLINICAL DATA:  Pain after fall EXAM: LEFT SHOULDER - 2+ VIEW COMPARISON:  None Available. FINDINGS: Mild AC joint and glenohumeral degenerative change. No definitive fracture or malalignment. IMPRESSION: Mild degenerative change. Electronically Signed   By: Jasmine Pang M.D.   On: 10/23/2023 00:44   DG Shoulder 1 View Right Result Date: 10/23/2023 CLINICAL DATA:  Pain after fall EXAM: RIGHT SHOULDER - 1 VIEW COMPARISON:  None Available. FINDINGS: Mild AC joint and glenohumeral degenerative change. No definitive fracture or malalignment. IMPRESSION: Mild degenerative change. Electronically Signed   By: Jasmine Pang M.D.   On: 10/23/2023 00:44   CT Knee Left Wo Contrast Result Date: 10/23/2023 CLINICAL DATA:  Knee trauma tibial plateau fracture EXAM: CT OF THE LEFT KNEE WITHOUT CONTRAST TECHNIQUE: Multidetector CT imaging of the left knee was performed according to the standard protocol. Multiplanar CT image reconstructions were also generated. RADIATION DOSE REDUCTION: This exam was performed according to the  departmental dose-optimization program which includes automated exposure control, adjustment of the mA and/or kV according to patient size and/or use of iterative reconstruction technique. COMPARISON:  Radiographs 10/22/2023 FINDINGS: Bones/Joint/Cartilage Small knee effusion. No significant lipohemarthrosis. Mild lateral deviation of the patella. Small foci of gas in the subpatellar joint space. Overall grainy image quality, likely due to habitus. No definitive acute  fracture seen. Minimal deformity with sclerosis on sagittal series 10, image 57, could be due to remote injury. 4 mm intra-articular calcification slightly peripheral to the tibial spines could reflect small loose body. Tricompartment arthritis with moderate severe medial tibiofemoral joint space narrowing, prominent spurring and subarticular sclerosis and cyst formation. Moderate patellofemoral degenerative changes, worse on the lateral side. Ligaments Suboptimally assessed by CT. Muscles and Tendons No intramuscular fluid collections. Quadriceps tendon and patellar tendon appear grossly intact. Soft tissues Mild soft tissue swelling. IMPRESSION: 1. Limited secondary to body habitus and overall grainy appearance of images. No definitive acute fracture is seen. Trace joint effusion without evidence for lipohemarthrosis. Possible small calcified loose body within the anterior tibiofemoral joint space near the tibial spines 2. Small foci of gas in the subpatellar joint space, correlate for any recent intervention. 3. Tricompartment arthritis, moderate to severe in the medial tibiofemoral compartment. Electronically Signed   By: Jasmine Pang M.D.   On: 10/23/2023 00:43   DG Humerus Left Result Date: 10/23/2023 CLINICAL DATA:  Pain after fall EXAM: LEFT HUMERUS - 2+ VIEW COMPARISON:  None Available. FINDINGS: There is no evidence of fracture or other focal bone lesions. Soft tissues are unremarkable. IMPRESSION: Negative. Electronically Signed   By:  Jasmine Pang M.D.   On: 10/23/2023 00:25   DG Femur 1 View Left Result Date: 10/22/2023 CLINICAL DATA:  [id number] Fall 190176; pain EXAM: PORTABLE LEFT KNEE - 1-2 VIEW; LEFT TIBIA AND FIBULA - 2 VIEW; LEFT FEMUR 1 VIEW; PORTABLE LEFT ANKLE - 2 VIEW COMPARISON:  None Available. FINDINGS: Acute displaced tibial plateau intercondylar fracture. No dislocation. Limited evaluation for joint effusion/lipohemarthrosis due to collimation of view and only frontal view of the femur. Severe tricompartmental degenerative changes of the knee. Soft tissues are unremarkable. IMPRESSION: 1. Acute displaced tibial plateau intercondylar fracture. 2. Limited evaluation for joint effusion/lipohemarthrosis due to collimation of view and only frontal view of the femur. 3. Severe tricompartmental degenerative changes of the knee. 4. No other acute fracture or dislocation of the left femur, knee, tibia fibula, ankle. Electronically Signed   By: Tish Frederickson M.D.   On: 10/22/2023 23:13   DG Tibia/Fibula Left Result Date: 10/22/2023 CLINICAL DATA:  [id number] Fall 190176; pain EXAM: PORTABLE LEFT KNEE - 1-2 VIEW; LEFT TIBIA AND FIBULA - 2 VIEW; LEFT FEMUR 1 VIEW; PORTABLE LEFT ANKLE - 2 VIEW COMPARISON:  None Available. FINDINGS: Acute displaced tibial plateau intercondylar fracture. No dislocation. Limited evaluation for joint effusion/lipohemarthrosis due to collimation of view and only frontal view of the femur. Severe tricompartmental degenerative changes of the knee. Soft tissues are unremarkable. IMPRESSION: 1. Acute displaced tibial plateau intercondylar fracture. 2. Limited evaluation for joint effusion/lipohemarthrosis due to collimation of view and only frontal view of the femur. 3. Severe tricompartmental degenerative changes of the knee. 4. No other acute fracture or dislocation of the left femur, knee, tibia fibula, ankle. Electronically Signed   By: Tish Frederickson M.D.   On: 10/22/2023 23:13   DG Ankle Left Port Result  Date: 10/22/2023 CLINICAL DATA:  [id number] Fall 190176; pain EXAM: PORTABLE LEFT KNEE - 1-2 VIEW; LEFT TIBIA AND FIBULA - 2 VIEW; LEFT FEMUR 1 VIEW; PORTABLE LEFT ANKLE - 2 VIEW COMPARISON:  None Available. FINDINGS: Acute displaced tibial plateau intercondylar fracture. No dislocation. Limited evaluation for joint effusion/lipohemarthrosis due to collimation of view and only frontal view of the femur. Severe tricompartmental degenerative changes of the knee. Soft tissues are unremarkable. IMPRESSION: 1. Acute displaced tibial plateau  intercondylar fracture. 2. Limited evaluation for joint effusion/lipohemarthrosis due to collimation of view and only frontal view of the femur. 3. Severe tricompartmental degenerative changes of the knee. 4. No other acute fracture or dislocation of the left femur, knee, tibia fibula, ankle. Electronically Signed   By: Tish Frederickson M.D.   On: 10/22/2023 23:13   DG Knee Left Port Result Date: 10/22/2023 CLINICAL DATA:  [id number] Fall 190176; pain EXAM: PORTABLE LEFT KNEE - 1-2 VIEW; LEFT TIBIA AND FIBULA - 2 VIEW; LEFT FEMUR 1 VIEW; PORTABLE LEFT ANKLE - 2 VIEW COMPARISON:  None Available. FINDINGS: Acute displaced tibial plateau intercondylar fracture. No dislocation. Limited evaluation for joint effusion/lipohemarthrosis due to collimation of view and only frontal view of the femur. Severe tricompartmental degenerative changes of the knee. Soft tissues are unremarkable. IMPRESSION: 1. Acute displaced tibial plateau intercondylar fracture. 2. Limited evaluation for joint effusion/lipohemarthrosis due to collimation of view and only frontal view of the femur. 3. Severe tricompartmental degenerative changes of the knee. 4. No other acute fracture or dislocation of the left femur, knee, tibia fibula, ankle. Electronically Signed   By: Tish Frederickson M.D.   On: 10/22/2023 23:13   DG Pelvis Portable Result Date: 10/22/2023 CLINICAL DATA:  Trauma EXAM: PORTABLE PELVIS 1-2 VIEWS  COMPARISON:  CT abdomen pelvis 10/22/2023 FINDINGS: Limited evaluation due to overlapping osseous structures and overlying soft tissues. There is no evidence of pelvic fracture or diastasis. No acute displaced fracture or dislocation of either hips on frontal view. No pelvic bone lesions are seen. IMPRESSION: Negative for acute traumatic injury. Electronically Signed   By: Tish Frederickson M.D.   On: 10/22/2023 22:58   DG Chest Port 1 View Result Date: 10/22/2023 CLINICAL DATA:  Trauma fall. EXAM: PORTABLE CHEST 1 VIEW COMPARISON:  CT chest 09/02/2023 FINDINGS: Tracheostomy tube terminates 8 cm above the carina. Cardiomegaly.  The heart and mediastinal contours are unchanged. No focal consolidation. No pulmonary edema. No pleural effusion. No pneumothorax. No acute osseous abnormality. IMPRESSION: No active disease. Electronically Signed   By: Tish Frederickson M.D.   On: 10/22/2023 22:56   CT HEAD WO CONTRAST Result Date: 10/22/2023 CLINICAL DATA:  Head trauma, moderate-severe; Polytrauma, blunt EXAM: CT HEAD WITHOUT CONTRAST CT CERVICAL SPINE WITHOUT CONTRAST TECHNIQUE: Multidetector CT imaging of the head and cervical spine was performed following the standard protocol without intravenous contrast. Multiplanar CT image reconstructions of the cervical spine were also generated. RADIATION DOSE REDUCTION: This exam was performed according to the departmental dose-optimization program which includes automated exposure control, adjustment of the mA and/or kV according to patient size and/or use of iterative reconstruction technique. COMPARISON:  CT head 08/19/2020 FINDINGS: CT HEAD FINDINGS Brain: No evidence of large-territorial acute infarction. No parenchymal hemorrhage. No mass lesion. No extra-axial collection. No mass effect or midline shift. No hydrocephalus. Basilar cisterns are patent. Vascular: No hyperdense vessel. Skull: No acute fracture or focal lesion. Sinuses/Orbits: Paranasal sinuses and mastoid  air cells are clear. The orbits are unremarkable. Other: None. CT CERVICAL SPINE FINDINGS Alignment: Normal. Skull base and vertebrae: Limited evaluation due to quantum mottle artifact. No acute fracture. No aggressive appearing focal osseous lesion or focal pathologic process. Soft tissues and spinal canal: No prevertebral fluid or swelling. No visible canal hematoma. Upper chest: Unremarkable. Other: Tracheostomy tube tip 7 cm above the carina. IMPRESSION: 1. No acute intracranial abnormality. 2. No acute displaced fracture or traumatic listhesis of the cervical spine. Limited evaluation due to body habitus. Electronically Signed   By:  Tish Frederickson M.D.   On: 10/22/2023 22:55   CT CERVICAL SPINE WO CONTRAST Result Date: 10/22/2023 CLINICAL DATA:  Head trauma, moderate-severe; Polytrauma, blunt EXAM: CT HEAD WITHOUT CONTRAST CT CERVICAL SPINE WITHOUT CONTRAST TECHNIQUE: Multidetector CT imaging of the head and cervical spine was performed following the standard protocol without intravenous contrast. Multiplanar CT image reconstructions of the cervical spine were also generated. RADIATION DOSE REDUCTION: This exam was performed according to the departmental dose-optimization program which includes automated exposure control, adjustment of the mA and/or kV according to patient size and/or use of iterative reconstruction technique. COMPARISON:  CT head 08/19/2020 FINDINGS: CT HEAD FINDINGS Brain: No evidence of large-territorial acute infarction. No parenchymal hemorrhage. No mass lesion. No extra-axial collection. No mass effect or midline shift. No hydrocephalus. Basilar cisterns are patent. Vascular: No hyperdense vessel. Skull: No acute fracture or focal lesion. Sinuses/Orbits: Paranasal sinuses and mastoid air cells are clear. The orbits are unremarkable. Other: None. CT CERVICAL SPINE FINDINGS Alignment: Normal. Skull base and vertebrae: Limited evaluation due to quantum mottle artifact. No acute fracture.  No aggressive appearing focal osseous lesion or focal pathologic process. Soft tissues and spinal canal: No prevertebral fluid or swelling. No visible canal hematoma. Upper chest: Unremarkable. Other: Tracheostomy tube tip 7 cm above the carina. IMPRESSION: 1. No acute intracranial abnormality. 2. No acute displaced fracture or traumatic listhesis of the cervical spine. Limited evaluation due to body habitus. Electronically Signed   By: Tish Frederickson M.D.   On: 10/22/2023 22:55   CT CHEST ABDOMEN PELVIS W CONTRAST Result Date: 10/22/2023 CLINICAL DATA:  Trauma, fall on blood thinners EXAM: CT CHEST, ABDOMEN, AND PELVIS WITH CONTRAST TECHNIQUE: Multidetector CT imaging of the chest, abdomen and pelvis was performed following the standard protocol during bolus administration of intravenous contrast. RADIATION DOSE REDUCTION: This exam was performed according to the departmental dose-optimization program which includes automated exposure control, adjustment of the mA and/or kV according to patient size and/or use of iterative reconstruction technique. CONTRAST:  65mL OMNIPAQUE IOHEXOL 350 MG/ML SOLN COMPARISON:  CT chest angiogram, 09/02/2023 FINDINGS: CT CHEST FINDINGS Cardiovascular: Aortic atherosclerosis. Cardiomegaly. No pericardial effusion. Mediastinum/Nodes: No enlarged mediastinal, hilar, or axillary lymph nodes. Thyroid gland, trachea, and esophagus demonstrate no significant findings. Lungs/Pleura: Tracheostomy. Mild bibasilar scarring or atelectasis and diffuse mosaic attenuation of the airspaces, suggesting small airways disease. No pleural effusion or pneumothorax. Musculoskeletal: Soft tissue contusion and fat stranding involving the right sternocleidomastoid muscle and overlying superficial subcutaneous soft tissues (series 3, image 6). Nondisplaced fractures of the right clavicular head (series 6, image 74). No dislocation of the sternoclavicular joint. Suspect nondisplaced fractures of the  proximal left humerus, incompletely imaged and suboptimally assessed (series 6, image 65). CT ABDOMEN PELVIS FINDINGS Hepatobiliary: Hepatomegaly, maximum coronal span 20.5 cm. No solid liver abnormality is seen. No gallstones, gallbladder wall thickening, or biliary dilatation. Pancreas: Unremarkable. No pancreatic ductal dilatation or surrounding inflammatory changes. Spleen: Normal in size without significant abnormality. Adrenals/Urinary Tract: Unchanged, definitively benign right adrenal adenoma, requiring no further follow-up or characterization. Kidneys are normal, without renal calculi, solid lesion, or hydronephrosis. Bladder is unremarkable. Stomach/Bowel: Stomach is within normal limits. Appendix appears normal. No evidence of bowel wall thickening, distention, or inflammatory changes. Vascular/Lymphatic: Multiple small left ovarian vein phleboliths, which do not reflect urinary tract calculi. No enlarged abdominal or pelvic lymph nodes. Reproductive: No mass or other abnormality. Other: Small, fat containing umbilical hernia.  No ascites. Musculoskeletal: New, likely acute superior endplate deformity of L1 without significant  anterior height loss (series 7, image 64). Disc degenerative disease and bridging osteophytosis throughout the thoracic spine. IMPRESSION: 1. New, likely acute superior endplate deformity of L1 without significant anterior height loss. 2. Nondisplaced fractures of the right clavicular head. No dislocation of the sternoclavicular joint. Soft tissue contusion and fat stranding involving the right sternocleidomastoid muscle and overlying superficial subcutaneous soft tissues. 3. Suspect nondisplaced fractures of the proximal left humerus, incompletely imaged and suboptimally assessed. 4. No CT evidence of organ injury to the chest, abdomen, or pelvis. 5. Tracheostomy. 6. Cardiomegaly. Aortic Atherosclerosis (ICD10-I70.0). Electronically Signed   By: Jearld Lesch M.D.   On: 10/22/2023  22:10   Labs cbc Recent Labs    10/22/23 2108 10/23/23 0207 10/23/23 0408 10/23/23 0519  WBC 9.8  --  8.5  --   HGB 13.4   < > 12.5 13.9  HCT 44.7   < > 42.2 41.0  PLT 236  --  205  --    < > = values in this interval not displayed.    Labs inflam No results for input(s): "CRP" in the last 72 hours.  Invalid input(s): "ESR"  Labs coag Recent Labs    10/22/23 2108  INR 1.1    Recent Labs    10/22/23 2108 10/23/23 0207 10/23/23 0408 10/23/23 0519  NA 138   < > 140 140  K 4.5   < > 4.6 4.4  CL 97*  --  99  --   CO2 30  --  33*  --   GLUCOSE 104*  --  101*  --   BUN 17  --  17  --   CREATININE 0.96  --  0.98  --   CALCIUM 9.1  --  8.7*  --    < > = values in this interval not displayed.     ASSESSMENT AND PLAN: 58 y.o. female with the following:  Right nondisplaced medial clavicle fracture No evidence of left proximal humerus fracture No evidence of tibial plateau fracture.  However she does have severe tricompartmental osteoarthritis exacerbation of her pre-existing arthritis with possible osteophyte fracture as there appears to be a small loose body in her knee  This patient requires inpatient admission to manage this problem appropriately. Orthopedics recommends admission to a medical service and we will provide consultation and follow along.  At this time there is no surgical intervention planned.  Her medial clavicle fracture is nondisplaced and there is no evidence of further fractures on imaging.  - Weight Bearing Status/Activity:  1.Sling for comfort for RUE, Recommend NWB but if she can tolerated using the RUE for transfers she is allowed 2. WBAT LUE and LLE  - Additional recommended labs/tests: None  -VTE Prophylaxis: per primary team  - Pain control: per primary team

## 2023-10-23 NOTE — Progress Notes (Addendum)
 Repeat chest x-ray showing tracheostomy tube in place.  Patient has cardiomegaly with vascular congestion.  Interim finding of bilateral mid to lower lung patchy heterogeneous airspace disease, question pneumonia or possible hemorrhage given previous history of fall on thinners.  -Patient is hemodynamically stable.  No evidence of hemoptysis.  CBC no evidence of leukocytosis and stable H&H 12.5 and 42. At this time there is no concern for development of pneumonia and pulmonary hemorrhage.  Continue monitor hemodynamic status and CBC. - History of CHF and having pulmonary vascular congestion on chest x-ray.-Giving Lasix 40 mg IV and will repeat chest x-ray in 4 hours.

## 2023-10-23 NOTE — Progress Notes (Signed)
 PHARMACY - ANTICOAGULATION CONSULT NOTE  Pharmacy Consult for heparin Indication:  h/o VTE  No Known Allergies  Patient Measurements: Height: 5\' 6"  (167.6 cm) Weight: (!) 178 kg (392 lb 6.7 oz) IBW/kg (Calculated) : 59.3 HEPARIN DW (KG): 105.3  Vital Signs: Temp: 98.9 F (37.2 C) (04/11 1559) Temp Source: Axillary (04/11 1559) BP: 140/75 (04/11 1700) Pulse Rate: 66 (04/11 1700)  Labs: Recent Labs    10/22/23 2102 10/22/23 2108 10/23/23 0207 10/23/23 0408 10/23/23 0519 10/23/23 1018 10/23/23 1758  HGB 17.0* 13.4 15.3* 12.5 13.9  --   --   HCT 50.0* 44.7 45.0 42.2 41.0  --   --   PLT  --  236  --  205  --   --   --   APTT  --   --   --   --   --  45* 61*  LABPROT  --  14.7  --   --   --   --   --   INR  --  1.1  --   --   --   --   --   HEPARINUNFRC  --   --   --   --   --  0.58 0.66  CREATININE 1.10* 0.96  --  0.98  --   --   --   CKTOTAL  --  385*  --   --   --   --   --     Estimated Creatinine Clearance: 105.5 mL/min (by C-G formula based on SCr of 0.98 mg/dL).   Medical History: Past Medical History:  Diagnosis Date   Chronic diastolic CHF (congestive heart failure) (HCC) 01/05/2022   DVT (deep vein thrombosis) in pregnancy    RLE DVT 02/2016   Dyspnea    Essential hypertension 08/25/2017   Hyperlipidemia 11/21/2021   Morbid obesity (HCC)    Obesity hypoventilation syndrome (HCC) 05/03/2021   PE (pulmonary embolism) 02/29/2016   Sleep apnea    uses a cpap   Tracheostomy present (HCC) 01/05/2022   Size 6 shiley/cuffless  Last changed: 10/9     Discussion  Stable trach.  She does get a Fleischhacker more short of breath with exertion.  Although I did not observe it on my exam she does endorse she will occasionally have a rush of air after removing PMV following activity.  This could suggest some mild air trapping from the trach.  Otherwise she is doing fantastic with the trach     Plan  Cont routin    Assessment: 58yo female admitted for multiple fractures  after sustaining a fall, to transition from apixaban (last dose 4/10 8a) to heparin; admitting MD would like to keep goal at lower end.  Heparin level is therapeutic at 0.66, aPTT is not yet correlating but subtherapeutic at 61, on 1900 units/hr. No s/sx of bleeding or infusion issues.   Goal of Therapy:  Heparin level 0.3-0.5 units/ml aPTT 66-85 seconds Monitor platelets by anticoagulation protocol: Yes   Plan:  Increase heparin to 2000 units/hr Order heparin level in 6 hours  Monitor heparin levels, aPTT (while DOAC affects anti-Xa assay), and CBC.  Thank you for allowing pharmacy to participate in this patient's care,  Sherron Monday, PharmD, BCCCP Clinical Pharmacist  Phone: [phone] 10/23/2023 6:46 PM  Please check AMION for all [facility] phone numbers After 10:00 PM, call Main Pharmacy [phone]

## 2023-10-23 NOTE — Progress Notes (Signed)
 RT assisted with patienmt transport on vent from ED to 4N24 without complications. Report given to assigned ICU RT.

## 2023-10-24 ENCOUNTER — Inpatient Hospital Stay (HOSPITAL_COMMUNITY): Payer: MEDICAID

## 2023-10-24 DIAGNOSIS — S32019A Unspecified fracture of first lumbar vertebra, initial encounter for closed fracture: Secondary | ICD-10-CM

## 2023-10-24 DIAGNOSIS — J9622 Acute and chronic respiratory failure with hypercapnia: Secondary | ICD-10-CM

## 2023-10-24 DIAGNOSIS — S82142A Displaced bicondylar fracture of left tibia, initial encounter for closed fracture: Secondary | ICD-10-CM

## 2023-10-24 DIAGNOSIS — S42001A Fracture of unspecified part of right clavicle, initial encounter for closed fracture: Secondary | ICD-10-CM

## 2023-10-24 DIAGNOSIS — J9621 Acute and chronic respiratory failure with hypoxia: Principal | ICD-10-CM

## 2023-10-24 LAB — GLUCOSE, CAPILLARY
Glucose-Capillary: 71 mg/dL (ref 70–99)
Glucose-Capillary: 58 mg/dL — ABNORMAL LOW (ref 70–99)
Glucose-Capillary: 71 mg/dL (ref 70–99)
Glucose-Capillary: 83 mg/dL (ref 70–99)
Glucose-Capillary: 100 mg/dL — ABNORMAL HIGH (ref 70–99)
Glucose-Capillary: 114 mg/dL — ABNORMAL HIGH (ref 70–99)
Glucose-Capillary: 100 mg/dL — ABNORMAL HIGH (ref 70–99)

## 2023-10-24 LAB — MAGNESIUM: Magnesium: 2.2 mg/dL (ref 1.7–2.4)

## 2023-10-24 LAB — CBC
HCT: 43.7 % (ref 36.0–46.0)
Hemoglobin: 13 g/dL (ref 12.0–15.0)
MCH: 28.6 pg (ref 26.0–34.0)
MCHC: 29.7 g/dL — ABNORMAL LOW (ref 30.0–36.0)
MCV: 96.3 fL (ref 80.0–100.0)
Platelets: 233 10*3/uL (ref 150–400)
RBC: 4.54 MIL/uL (ref 3.87–5.11)
RDW: 16.4 % — ABNORMAL HIGH (ref 11.5–15.5)
WBC: 6 10*3/uL (ref 4.0–10.5)
nRBC: 0.3 % — ABNORMAL HIGH (ref 0.0–0.2)

## 2023-10-24 LAB — APTT
aPTT: 38 s — ABNORMAL HIGH (ref 24–36)
aPTT: 63 s — ABNORMAL HIGH (ref 24–36)

## 2023-10-24 LAB — BASIC METABOLIC PANEL WITH GFR
Anion gap: 10 (ref 5–15)
BUN: 21 mg/dL — ABNORMAL HIGH (ref 6–20)
CO2: 31 mmol/L (ref 22–32)
Calcium: 8.5 mg/dL — ABNORMAL LOW (ref 8.9–10.3)
Chloride: 96 mmol/L — ABNORMAL LOW (ref 98–111)
Creatinine, Ser: 1 mg/dL (ref 0.44–1.00)
GFR, Estimated: >60 mL/min (ref >60)
Glucose, Bld: 69 mg/dL — ABNORMAL LOW (ref 70–99)
Potassium: 3.9 mmol/L (ref 3.5–5.1)
Sodium: 137 mmol/L (ref 135–145)

## 2023-10-24 LAB — HEPARIN LEVEL (UNFRACTIONATED)
Heparin Unfractionated: 0.43 [IU]/mL (ref 0.30–0.70)
Heparin Unfractionated: 0.58 [IU]/mL (ref 0.30–0.70)

## 2023-10-24 LAB — PHOSPHORUS: Phosphorus: 4.4 mg/dL (ref 2.5–4.6)

## 2023-10-24 MED ORDER — NALOXONE HCL 0.4 MG/ML IJ SOLN
INTRAMUSCULAR | Status: AC
  Filled 2023-10-24: qty 1

## 2023-10-24 MED ORDER — NALOXONE HCL 0.4 MG/ML IJ SOLN
0.4000 mg | INTRAMUSCULAR | Status: DC | PRN
  Administered 2023-10-24: 0.4 mg via INTRAVENOUS

## 2023-10-24 MED ORDER — TRAMADOL-ACETAMINOPHEN 37.5-325 MG PO TABS
1.0000 | ORAL_TABLET | ORAL | Status: DC | PRN
  Administered 2023-10-24 (×2): 2 via ORAL
  Filled 2023-10-24 (×2): qty 2

## 2023-10-24 MED ORDER — NALOXONE HCL 0.4 MG/ML IJ SOLN
INTRAMUSCULAR | Status: AC
  Administered 2023-10-24: 0.4 mg
  Filled 2023-10-24: qty 1

## 2023-10-24 MED ORDER — APIXABAN 5 MG PO TABS
5.0000 mg | ORAL_TABLET | Freq: Two times a day (BID) | ORAL | Status: DC
  Administered 2023-10-24: 5 mg via ORAL
  Filled 2023-10-24: qty 1

## 2023-10-24 MED ORDER — NALOXONE HCL 0.4 MG/ML IJ SOLN
0.4000 mg | Freq: Once | INTRAMUSCULAR | Status: AC
  Administered 2023-10-24: 0.4 mg via INTRAVENOUS

## 2023-10-24 NOTE — Progress Notes (Signed)
 Patients blood sugar was 58 at 0801. Patient given 2 containers of orange juice by CNA Felicia. Patient's blood sugar rechecked at 0822 with a value of 71. Will continue to monitor.

## 2023-10-24 NOTE — TOC CAGE-AID Note (Signed)
.  Transition of Care [facility]) - CAGE-AID Screening   Patient Details  Name: Angel Brewer MRN: [id number] Date of Birth: Aug 28, 1965  Misty Amour, RN Phone Number: 10/24/2023, 5:51 AM   Clinical Narrative:  Pt denies etoh/tobacco/elicit substance usage No resources needed   CAGE-AID Screening:    Have You Ever Felt You Ought to Cut Down on Your Drinking or Drug Use?: No Have People Annoyed You By [employer] Your Drinking Or Drug Use?: No Have You Felt Bad Or Guilty About Your Drinking Or Drug Use?: No Have You Ever Had a Drink or Used Drugs First Thing In The Morning to Steady Your Nerves or to Get Rid of a Hangover?: No CAGE-AID Score: 0  Substance Abuse Education Offered: No

## 2023-10-24 NOTE — Plan of Care (Signed)

## 2023-10-24 NOTE — Progress Notes (Cosign Needed)
 NAME:  Angel Brewer, MRN:  [id number], DOB:  07-11-1966, LOS: 1 ADMISSION DATE:  10/22/2023 CONSULTATION DATE:  10/23/2023 REFERRING MD:  Sundil - TRH CHIEF COMPLAINT: Fall, acute-on-chronic respiratory failure   History of Present Illness:  58 year old woman who presented [facility] ED 4/10 for fall and AoC RF. PMHx significant for HTN, HLD, chronic diastolic CHF (Echo 11/2021 with EF 60-65%, mild LVH), OHS/OSA s/p tracheostomy 11/2021 (on CPAP/nocturnal O2, followed by Sheryle Donning, NP) with chronic hypoxic and hypercarbic respiratory failure, DVT/PE (on Eliquis). Recent hospitalization 08/2023 for AoC RF, due to follow up in trach clinic around current time (~6 weeks from discharge date).   History is obtained primarily from chart review. Patient reportedly had a fall from the toilet around 1000 4/10AM and was unable to get up from the bathroom floor; experienced L leg pain and R shoulder pain. EMS was called and on arrival she was noted to be hypoxic on RA (SpO2 60s) and was placed on NRB 15L with SpO2 improved to 98%. Denied LOC/head injury.  On ED arrival, patient was afebrile, HR 96, BP 165/118, RR 25, SpO2 100% on NRB. CXR showed no active disease. CT Head/C-spine with NAICA, no acute displaced fracture/traumatic listhesis of C-spine. CT Chest/A/P showed new endplate deformity of L1, nondisplaced fractures of the R clavicular head, nondisplaced fractures of the proximal L humerus, tracheostomy. XR of L Femur demonstrated acute displaced tibial plateau intercondylar fracture and tricompartmental degenerative changes of the L knee.  Labs were notable for CBC WNL, INR 1.1, Na 138, K 4.5, CO2 30, Cr 0.96, AST/ALT/Alk Phos WNL, Tbili 1.7. CK 385, LA 1.6. Ethanol < 10. Initial VBG with pH 7.215/pCO2 93.7, bicarb 37.8. Patient was deemed to require mechanical ventilation and tracheostomy was exchanged by RT from #6 Shiley cuffless to #6 Shiley cuffed tracheostomy. Patient was subsequently placed on MV.  Repeat ABG improved to 7.349/pCO2 64.8/pO2 63/bicarb 35.7.  PCCM consulted for evaluation and management.  Angel Brewer states she felt dizzy prior to her fall, then was too weak too stand up (later found to have fractures as well). She feels like she has been sleeping much more lately and that her energy really never recovered from her hospitalization in February. She otherwise felt she had been doing well, no issues with her oxygenation or tracheostomy. Denies fever/chills, CP/SOB, n/v/d, recent sick contacts.  On 4/12 overnight patient on floors trach collar. Was initially Aox4. Throughout the night became unresponsive. Given narcan with some improvement in mentation but still confused. ABG showing co2 >100. Transferred to ICU and placed on vent.   Pertinent Medical History:   Past Medical History:  Diagnosis Date   Chronic diastolic CHF (congestive heart failure) (HCC) 01/05/2022   DVT (deep vein thrombosis) in pregnancy    RLE DVT 02/2016   Dyspnea    Essential hypertension 08/25/2017   Hyperlipidemia 11/21/2021   Morbid obesity (HCC)    Obesity hypoventilation syndrome (HCC) 05/03/2021   PE (pulmonary embolism) 02/29/2016   Sleep apnea    uses a cpap   Tracheostomy present (HCC) 01/05/2022   Size 6 shiley/cuffless  Last changed: 10/9     Discussion  Stable trach.  She does get a Holt more short of breath with exertion.  Although I did not observe it on my exam she does endorse she will occasionally have a rush of air after removing PMV following activity.  This could suggest some mild air trapping from the trach.  Otherwise she is doing fantastic with  the trach     Plan  Cont routin   Significant Hospital Events: Including procedures, antibiotic start and stop dates in addition to other pertinent events   4/10 - Presented to [facility] after fall. Hypoxic on RA with SpO2 60s per EMS, placed on NRB with improvement. Initial VBG poor with pH 7.219  Interim History / Subjective:  See  above  Objective:  Blood pressure (!) 166/70, pulse 84, temperature (!) 96.9 F (36.1 C), temperature source Axillary, resp. rate 17, height 5\' 6"  (1.676 m), weight (!) 178 kg, last menstrual period 11/29/2015, SpO2 92%.    FiO2 (%):  [35 %-70 %] 70 %   Intake/Output Summary (Last 24 hours) at 10/24/2023 2353 Last data filed at 10/24/2023 0000 Gross per 24 hour  Intake 226.74 ml  Output 400 ml  Net -173.26 ml   Filed Weights   10/22/23 2044  Weight: (!) 178 kg   Physical Examination: General:  critically ill appearing female w/ trach HEENT: MM pink/moist; trach in place Neuro: altered but able to state name; perrl CV: s1s2, RRR, no m/r/g PULM:  dim clear BS bilaterally; TC sats 96% GI: soft, bsx4 active  Extremities: warm/dry, no edema    Resolved Hospital Problem List:    Assessment & Plan:  Acute-on-chronic hypoxic and hypercarbic respiratory failure Acute mechanical ventilation need in the setting of respiratory failure Tracheostomy status, 11/2021 OSA/OHS Presented to [facility] 4/10 post-fall at home, felt dizzy prior to fall from toilet. SpO2 60% on RA on EMS arrival. Placed on NRB with significant improvement. Hypercarbic and hypoxic on gas, Shiley #6 cuffless exchanged to #6 cuffed for MV. Placed on ventilator with significant improvement in hypercarbia and mentation. Plan: -transfer to icu and place on vent -LTVV strategy with tidal volumes of 6-8 cc/kg ideal body weight -check ABG and adjust settings accordingly  -Goal plateau pressures less than 30 and driving pressures less than 15 -Wean PEEP/FiO2 for SpO2 >92% -VAP bundle in place  Mechanical fall with multiple fractures (R clavicle, L tibial plateau, L1 vertebrae) Plan: -per trauma/ortho/nsg -pt/ot   History of PE/DVT Plan: -heparin iv  Hx of chf, htn, hld Plan: -prn hydralazine -resume home anti-htn meds and statin when able  Best Practice (right click and "Reselect all SmartList Selections" daily)    Diet/type: NPO DVT prophylaxis: systemic heparin GI prophylaxis: PPI Central venous access:  N/A Foley:  N/A Code Status:  full code Last date of multidisciplinary goals of care discussion [Pending]  Labs:  CBC: Recent Labs  Lab 10/22/23 2108 10/23/23 0207 10/23/23 0408 10/23/23 0519 10/24/23 0711  WBC 9.8  --  8.5  --  6.0  HGB 13.4 15.3* 12.5 13.9 13.0  HCT 44.7 45.0 42.2 41.0 43.7  MCV 97.4  --  98.1  --  96.3  PLT 236  --  205  --  233   Basic Metabolic Panel: Recent Labs  Lab 10/22/23 2102 10/22/23 2108 10/23/23 0207 10/23/23 0408 10/23/23 0519 10/24/23 0711  NA 138 138 140 140 140 137  K 5.7* 4.5 4.5 4.6 4.4 3.9  CL 101 97*  --  99  --  96*  CO2  --  30  --  33*  --  31  GLUCOSE 101* 104*  --  101*  --  69*  BUN 26* 17  --  17  --  21*  CREATININE 1.10* 0.96  --  0.98  --  1.00  CALCIUM  --  9.1  --  8.7*  --  8.5*  MG  --   --   --   --   --  2.2  PHOS  --   --   --   --   --  4.4   GFR: Estimated Creatinine Clearance: 103.4 mL/min (by C-G formula based on SCr of 1 mg/dL). Recent Labs  Lab 10/22/23 2102 10/22/23 2108 10/23/23 0408 10/23/23 1018 10/24/23 0711  PROCALCITON  --   --   --  <0.10  --   WBC  --  9.8 8.5  --  6.0  LATICACIDVEN 1.6  --   --   --   --    Liver Function Tests: Recent Labs  Lab 10/22/23 2108 10/23/23 0408  AST 22 23  ALT 11 11  ALKPHOS 58 52  BILITOT 1.7* 1.5*  PROT 7.6 6.6  ALBUMIN 3.2* 2.9*   No results for input(s): "LIPASE", "AMYLASE" in the last 168 hours. No results for input(s): "AMMONIA" in the last 168 hours.  ABG:    Component Value Date/Time   PHART 7.349 (L) 10/23/2023 0519   PCO2ART 64.8 (H) 10/23/2023 0519   PO2ART 63 (L) 10/23/2023 0519   HCO3 35.7 (H) 10/23/2023 0519   TCO2 38 (H) 10/23/2023 0519   ACIDBASEDEF 4.0 (H) 05/01/2021 0158   O2SAT 89 10/23/2023 0519    Coagulation Profile: Recent Labs  Lab 10/22/23 2108  INR 1.1   Cardiac Enzymes: Recent Labs  Lab 10/22/23 2108   CKTOTAL 385*   HbA1C: Hemoglobin A1C  Date/Time Value Ref Range Status  06/10/2016 04:41 PM 5.4  Final   CBG: Recent Labs  Lab 10/24/23 0822 10/24/23 1144 10/24/23 1632 10/24/23 1941 10/24/23 2329  GLUCAP 71 83 100* 114* 100*    Review of Systems:   Review of systems completed with pertinent positives/negatives outlined in above HPI.  Past Medical History:  She,  has a past medical history of Chronic diastolic CHF (congestive heart failure) (HCC) (01/05/2022), DVT (deep vein thrombosis) in pregnancy, Dyspnea, Essential hypertension (08/25/2017), Hyperlipidemia (11/21/2021), Morbid obesity (HCC), Obesity hypoventilation syndrome (HCC) (05/03/2021), PE (pulmonary embolism) (02/29/2016), Sleep apnea, and Tracheostomy present (HCC) (01/05/2022).   Surgical History:   Past Surgical History:  Procedure Laterality Date   CESAREAN SECTION     x 3   OPEN REDUCTION INTERNAL FIXATION (ORIF) DISTAL RADIAL FRACTURE Right 06/20/2019   Procedure: OPEN REDUCTION INTERNAL FIXATION (ORIF) DISTAL RADIAL FRACTURE;  Surgeon: Rober Chimera, MD;  Location: [facility] OR;  Service: Orthopedics;  Laterality: Right;  REGIONAL BLOCK TOTAL SURGERY TIME: 90 MINUTES   SYNOVECTOMY Right 11/21/2019   Procedure: RIGHT WRIST LIGAMENT RECONSTRUCTION;  Surgeon: Rober Chimera, MD;  Location: [facility] OR;  Service: Orthopedics;  Laterality: Right;  PROCEDURE: RIGHT WRIST LIGAMENT RECONSTRUCTION LENGTH OF SURGERY: 105 MIN    Social History:   reports that she has quit smoking. She has never used smokeless tobacco. She reports that she does not drink alcohol and does not use drugs.   Family History:  Her family history includes Hypertension in her sister.   Allergies: No Known Allergies   Home Medications: Prior to Admission medications   Medication Sig Start Date End Date Taking? Authorizing Provider  amLODipine (NORVASC) 5 MG tablet Take 1 tablet (5 mg total) by mouth daily. 06/17/23  Yes Fleming, Zelda W, NP   apixaban (ELIQUIS) 5 MG TABS tablet Take 1 tablet (5 mg total) by mouth 2 (two) times daily. 06/17/23  Yes Fleming, Zelda W, NP  atorvastatin (LIPITOR) 20 MG tablet Take 1  tablet (20 mg total) by mouth daily. 06/17/23  Yes Fleming, Zelda W, NP  dapagliflozin propanediol (FARXIGA) 10 MG TABS tablet Take 1 tablet (10 mg total) by mouth daily. 06/17/23  Yes Fleming, Zelda W, NP  furosemide (LASIX) 20 MG tablet Take 1 tablet (20 mg total) by mouth daily. Take 40 mg or 2 tablets for the next 5 days then resume 20 mg or 1 tablet by mouth daily by mouth. 06/17/23  Yes [name], NP  PRESCRIPTION MEDICATION 1 each by Other route at bedtime. CPAP- At bedtime   Yes [provider]  spironolactone (ALDACTONE) 25 MG tablet Take 1 tablet (25 mg total) by mouth daily. 06/17/23  Yes [name], NP  Misc. Devices MISC Please supply patient with tracheostomy humidifier  Z93.0 03/25/22   [name], NP    Critical care time: 40 minutes   JD Carliss Chess [facility] 10/25/2023, 12:02 AM  Please see [email or web address] for pager details.  From 7A-7P if no response, please call [phone]. After hours, please call ELink [phone].

## 2023-10-24 NOTE — Progress Notes (Signed)
 eLink Physician-Brief Progress Note Patient Name: Angel Brewer DOB: 12/18/1965 MRN: [id number]   Date of Service  10/24/2023  HPI/Events of Note  4N needs to make room for an incoming admission, patient successfully separated from the ventilator and appears to be at baseline respiratory status.  eICU Interventions  Will transfer patient to step down unit.        [name] U [name] 10/24/2023, 12:29 AM

## 2023-10-24 NOTE — Plan of Care (Signed)
  Problem: Education: Goal: Ability to describe self-care measures that may prevent or decrease complications (Diabetes Survival Skills Education) will improve Outcome: Progressing Goal: Individualized Educational Video(s) Outcome: Progressing   Problem: Coping: Goal: Ability to adjust to condition or change in health will improve Outcome: Progressing   Problem: Fluid Volume: Goal: Ability to maintain a balanced intake and output will improve Outcome: Progressing   Problem: Health Behavior/Discharge Planning: Goal: Ability to identify and utilize available resources and services will improve Outcome: Progressing Goal: Ability to manage health-related needs will improve Outcome: Progressing   Problem: Metabolic: Goal: Ability to maintain appropriate glucose levels will improve Outcome: Progressing   Problem: Nutritional: Goal: Maintenance of adequate nutrition will improve Outcome: Progressing Goal: Progress toward achieving an optimal weight will improve Outcome: Progressing   Problem: Tissue Perfusion: Goal: Adequacy of tissue perfusion will improve Outcome: Progressing   Problem: Education: Goal: Knowledge of General Education information will improve Description: Including pain rating scale, medication(s)/side effects and non-pharmacologic comfort measures Outcome: Progressing   Problem: Clinical Measurements: Goal: Ability to maintain clinical measurements within normal limits will improve Outcome: Progressing Goal: Will remain free from infection Outcome: Progressing Goal: Diagnostic test results will improve Outcome: Progressing Goal: Respiratory complications will improve Outcome: Progressing Goal: Cardiovascular complication will be avoided Outcome: Progressing   Problem: Activity: Goal: Risk for activity intolerance will decrease Outcome: Progressing   Problem: Nutrition: Goal: Adequate nutrition will be maintained Outcome: Progressing   Problem:  Coping: Goal: Level of anxiety will decrease Outcome: Progressing   Problem: Elimination: Goal: Will not experience complications related to bowel motility Outcome: Progressing Goal: Will not experience complications related to urinary retention Outcome: Progressing   Problem: Pain Managment: Goal: General experience of comfort will improve and/or be controlled Outcome: Progressing   Problem: Safety: Goal: Ability to remain free from injury will improve Outcome: Progressing

## 2023-10-24 NOTE — Progress Notes (Signed)
 eLink Physician-Brief Progress Note Patient Name: Jonny Longino Yadao DOB: 04/27/1966 MRN: [id number]   Date of Service  10/24/2023  HPI/Events of Note  Patient became suddenly less responsive with grunting and garbled speech.  Previously alert and oriented x 4.  Has a tracheostomy with Passy-Muir valve in place.  Tramadol was scanned but never administered at 2309  eICU Interventions  Attempted Narcan x 1 with moderate response, second dose of it being administered.  Ground team expeditiously evaluated the patient at bedside as it is a noncamera room.  Will assess for mucous plugging/CO2 narcosis with chest radiograph and ABG.  Hold tramadol for the time being.   1191 -patient found to be in CO2 narcosis-query obstructive lung disease versus mucous plugging versus obesity hypoventilation.  Ultimately, radiograph appears stable and the patient is placed back on the ventilator.  Will add scheduled SVNs for the time being.  Check BNP in the morning.  May need to establish long-term enteral access in the morning  0149 -critical lab update: pH, Arterial: 7.169 (LL) pCO2 arterial: 104.4 (HH) pO2, Arterial: 82 (L)   Doesn't seem to be ventilating, well, may be somewhat related to positioning, although her pilot balloon seems to be well-inflated and positioning does not seem to be helping.  Bedside staff planning on cleaning the patient off, they will try to hyperextend the neck by placing additional pillows under her shoulder blades.  If this is ineffective at improving ventilatory mechanics in addition to increasing her respiratory rate, may need to attempt alternative tracheostomy appliances.  Repeat gas after repositioning   Intervention Category Intermediate Interventions: Change in mental status - evaluation and management  Aamari West 10/24/2023, 11:36 PM

## 2023-10-24 NOTE — Progress Notes (Addendum)
 PHARMACY - ANTICOAGULATION CONSULT NOTE  Pharmacy Consult for heparin Indication:  h/o VTE  No Known Allergies  Patient Measurements: Height: 5\' 6"  (167.6 cm) Weight: (!) 178 kg (392 lb 6.7 oz) IBW/kg (Calculated) : 59.3 HEPARIN DW (KG): 105.3  Vital Signs: Temp: 98.3 F (36.8 C) (04/12 1146) Temp Source: Oral (04/12 1146) BP: 148/84 (04/12 1146) Pulse Rate: 70 (04/12 1533)  Labs: Recent Labs    10/22/23 2108 10/23/23 0207 10/23/23 0408 10/23/23 0519 10/23/23 1018 10/23/23 1758 10/24/23 0711 10/24/23 1430  HGB 13.4   < > 12.5 13.9  --   --  13.0  --   HCT 44.7   < > 42.2 41.0  --   --  43.7  --   PLT 236  --  205  --   --   --  233  --   APTT  --   --   --   --    < > 61* 38* 63*  LABPROT 14.7  --   --   --   --   --   --   --   INR 1.1  --   --   --   --   --   --   --   HEPARINUNFRC  --   --   --   --    < > 0.66 0.43 0.58  CREATININE 0.96  --  0.98  --   --   --  1.00  --   CKTOTAL 385*  --   --   --   --   --   --   --    < > = values in this interval not displayed.    Estimated Creatinine Clearance: 103.4 mL/min (by C-G formula based on SCr of 1 mg/dL).   Medical History: Past Medical History:  Diagnosis Date   Chronic diastolic CHF (congestive heart failure) (HCC) 01/05/2022   DVT (deep vein thrombosis) in pregnancy    RLE DVT 02/2016   Dyspnea    Essential hypertension 08/25/2017   Hyperlipidemia 11/21/2021   Morbid obesity (HCC)    Obesity hypoventilation syndrome (HCC) 05/03/2021   PE (pulmonary embolism) 02/29/2016   Sleep apnea    uses a cpap   Tracheostomy present (HCC) 01/05/2022   Size 6 shiley/cuffless  Last changed: 10/9     Discussion  Stable trach.  She does get a [place] more short of breath with exertion.  Although I did not observe it on my exam she does endorse she will occasionally have a rush of air after removing PMV following activity.  This could suggest some mild air trapping from the trach.  Otherwise she is doing fantastic with  the trach     Plan  Cont routin    Assessment: 58yo female admitted for multiple fractures after sustaining a fall, to transition from apixaban (last dose 4/10 8a) to heparin; admitting MD would like to keep goal at lower end.  Heparin level is therapeutic at 0.43, aPTT is not yet correlating but subtherapeutic at 38, on 2000 units/hr. Heparin infusion IV found out at 0752 this AM by day shift RN (after PTT drawn). Iv replaced and restarted then. Will get aPTT 6 hour from restart  PM: aPTT 63 (subtherapeutic), HL 0.58 (not yet correlating) on heparin 2000 units/hr. No issues with the infusion or bleeding reported.  Heparin level 0.3-0.5 units/ml aPTT 66-85 seconds Monitor platelets by anticoagulation protocol: Yes   Plan:  Increase heparin infusion to  2150 units/hr Recheck aPTT/HL in 6 hours  Monitor heparin levels, aPTT (while DOAC affects anti-Xa assay), and CBC.  Armanda Bern, PharmD, BCPS Clinical Pharmacist [place]  Please check AMION for all [facility] Pharmacy phone numbers After 10:00 PM, call Main Pharmacy [phone]  ADDENDUM: To transition back to Eliquis - at 18:00, stop heparin drip, then resume Eliquis 5mg  BID.  Armanda Bern, PharmD, BCPS 10/24/2023 4:08 PM

## 2023-10-24 NOTE — Progress Notes (Deleted)
 Pccm called to bedside due to unresponsiveness.  Patient was AO x 4 per nurse earlier on shift.  Patient given Narcan.  On my arrival patient starting to wake up able to state name but still slightly confused.  Sats 96% on trach collar.  Will check ABG to ensure no CO2 retention

## 2023-10-24 NOTE — Progress Notes (Signed)
 PHARMACY - ANTICOAGULATION CONSULT NOTE  Pharmacy Consult for heparin Indication:  h/o VTE  No Known Allergies  Patient Measurements: Height: 5\' 6"  (167.6 cm) Weight: (!) 178 kg (392 lb 6.7 oz) IBW/kg (Calculated) : 59.3 HEPARIN DW (KG): 105.3  Vital Signs: Temp: 98.4 F (36.9 C) (04/12 0800) Temp Source: Oral (04/12 0800) BP: 143/74 (04/12 0800) Pulse Rate: 73 (04/12 0847)  Labs: Recent Labs    10/22/23 2108 10/23/23 0207 10/23/23 0408 10/23/23 0519 10/23/23 1018 10/23/23 1758 10/24/23 0711  HGB 13.4   < > 12.5 13.9  --   --  13.0  HCT 44.7   < > 42.2 41.0  --   --  43.7  PLT 236  --  205  --   --   --  233  APTT  --   --   --   --  45* 61* 38*  LABPROT 14.7  --   --   --   --   --   --   INR 1.1  --   --   --   --   --   --   HEPARINUNFRC  --   --   --   --  0.58 0.66 0.43  CREATININE 0.96  --  0.98  --   --   --  1.00  CKTOTAL 385*  --   --   --   --   --   --    < > = values in this interval not displayed.    Estimated Creatinine Clearance: 103.4 mL/min (by C-G formula based on SCr of 1 mg/dL).   Medical History: Past Medical History:  Diagnosis Date   Chronic diastolic CHF (congestive heart failure) (HCC) 01/05/2022   DVT (deep vein thrombosis) in pregnancy    RLE DVT 02/2016   Dyspnea    Essential hypertension 08/25/2017   Hyperlipidemia 11/21/2021   Morbid obesity (HCC)    Obesity hypoventilation syndrome (HCC) 05/03/2021   PE (pulmonary embolism) 02/29/2016   Sleep apnea    uses a cpap   Tracheostomy present (HCC) 01/05/2022   Size 6 shiley/cuffless  Last changed: 10/9     Discussion  Stable trach.  She does get a [place] more short of breath with exertion.  Although I did not observe it on my exam she does endorse she will occasionally have a rush of air after removing PMV following activity.  This could suggest some mild air trapping from the trach.  Otherwise she is doing fantastic with the trach     Plan  Cont routin    Assessment: 58yo female  admitted for multiple fractures after sustaining a fall, to transition from apixaban (last dose 4/10 8a) to heparin; admitting MD would like to keep goal at lower end.  Heparin level is therapeutic at 0.43, aPTT is not yet correlating but subtherapeutic at 38, on 2000 units/hr. Heparin infusion IV found out at 0752 this AM by day shift RN (after PTT drawn). Iv replaced and restarted then. Will get aPTT 6 hour from restart  Goal of Therapy:  Heparin level 0.3-0.5 units/ml aPTT 66-85 seconds Monitor platelets by anticoagulation protocol: Yes   Plan:  Continue at 2000 units/hr reorder heparin level and aPTT in 6 hours  Monitor heparin levels, aPTT (while DOAC affects anti-Xa assay), and CBC.  Renne Platts A. Brynn Caras, PharmD, BCPS, FNKF Clinical Pharmacist [place] Please utilize Amion for appropriate phone number to reach the unit pharmacist [facility])   Please check  AMION for all [facility] Pharmacy phone numbers After 10:00 PM, call Main Pharmacy [phone]

## 2023-10-24 NOTE — Plan of Care (Signed)
 Problem: Education: Goal: Ability to describe self-care measures that may prevent or decrease complications (Diabetes Survival Skills Education) will improve 10/24/2023 2004 by Les Rao, RN Outcome: Progressing 10/24/2023 2004 by Les Rao, RN Outcome: Progressing Goal: Individualized Educational Video(s) 10/24/2023 2004 by Les Rao, RN Outcome: Progressing 10/24/2023 2004 by Les Rao, RN Outcome: Progressing   Problem: Coping: Goal: Ability to adjust to condition or change in health will improve 10/24/2023 2004 by Les Rao, RN Outcome: Progressing 10/24/2023 2004 by Les Rao, RN Outcome: Progressing   Problem: Fluid Volume: Goal: Ability to maintain a balanced intake and output will improve 10/24/2023 2004 by Les Rao, RN Outcome: Progressing 10/24/2023 2004 by Les Rao, RN Outcome: Progressing   Problem: Health Behavior/Discharge Planning: Goal: Ability to identify and utilize available resources and services will improve 10/24/2023 2004 by Les Rao, RN Outcome: Progressing 10/24/2023 2004 by Les Rao, RN Outcome: Progressing Goal: Ability to manage health-related needs will improve 10/24/2023 2004 by Les Rao, RN Outcome: Progressing 10/24/2023 2004 by Les Rao, RN Outcome: Progressing   Problem: Metabolic: Goal: Ability to maintain appropriate glucose levels will improve 10/24/2023 2004 by Les Rao, RN Outcome: Progressing 10/24/2023 2004 by Les Rao, RN Outcome: Progressing   Problem: Nutritional: Goal: Maintenance of adequate nutrition will improve 10/24/2023 2004 by Les Rao, RN Outcome: Progressing 10/24/2023 2004 by Les Rao, RN Outcome: Progressing Goal: Progress toward achieving an optimal weight will improve 10/24/2023 2004 by Les Rao, RN Outcome: Progressing 10/24/2023 2004 by Les Rao, RN Outcome:  Progressing   Problem: Skin Integrity: Goal: Risk for impaired skin integrity will decrease 10/24/2023 2004 by Les Rao, RN Outcome: Progressing 10/24/2023 2004 by Les Rao, RN Outcome: Progressing   Problem: Tissue Perfusion: Goal: Adequacy of tissue perfusion will improve 10/24/2023 2004 by Les Rao, RN Outcome: Progressing 10/24/2023 2004 by Les Rao, RN Outcome: Progressing   Problem: Education: Goal: Knowledge of General Education information will improve Description: Including pain rating scale, medication(s)/side effects and non-pharmacologic comfort measures 10/24/2023 2004 by Les Rao, RN Outcome: Progressing 10/24/2023 2004 by Les Rao, RN Outcome: Progressing   Problem: Health Behavior/Discharge Planning: Goal: Ability to manage health-related needs will improve 10/24/2023 2004 by Les Rao, RN Outcome: Progressing 10/24/2023 2004 by Les Rao, RN Outcome: Progressing   Problem: Clinical Measurements: Goal: Ability to maintain clinical measurements within normal limits will improve 10/24/2023 2004 by Les Rao, RN Outcome: Progressing 10/24/2023 2004 by Les Rao, RN Outcome: Progressing Goal: Will remain free from infection 10/24/2023 2004 by Les Rao, RN Outcome: Progressing 10/24/2023 2004 by Les Rao, RN Outcome: Progressing Goal: Diagnostic test results will improve 10/24/2023 2004 by Les Rao, RN Outcome: Progressing 10/24/2023 2004 by Les Rao, RN Outcome: Progressing Goal: Respiratory complications will improve 10/24/2023 2004 by Les Rao, RN Outcome: Progressing 10/24/2023 2004 by Les Rao, RN Outcome: Progressing Goal: Cardiovascular complication will be avoided 10/24/2023 2004 by Les Rao, RN Outcome: Progressing 10/24/2023 2004 by Les Rao, RN Outcome: Progressing   Problem: Activity: Goal: Risk  for activity intolerance will decrease 10/24/2023 2004 by Les Rao, RN Outcome: Progressing 10/24/2023 2004 by Les Rao, RN Outcome: Progressing   Problem: Nutrition: Goal: Adequate nutrition will be maintained 10/24/2023 2004 by Les Rao, RN Outcome: Progressing 10/24/2023 2004 by Les Rao, RN Outcome: Progressing  Problem: Coping: Goal: Level of anxiety will decrease 10/24/2023 2004 by Les Rao, RN Outcome: Progressing 10/24/2023 2004 by Les Rao, RN Outcome: Progressing   Problem: Elimination: Goal: Will not experience complications related to bowel motility 10/24/2023 2004 by Les Rao, RN Outcome: Progressing 10/24/2023 2004 by Les Rao, RN Outcome: Progressing Goal: Will not experience complications related to urinary retention 10/24/2023 2004 by Les Rao, RN Outcome: Progressing 10/24/2023 2004 by Les Rao, RN Outcome: Progressing   Problem: Pain Managment: Goal: General experience of comfort will improve and/or be controlled 10/24/2023 2004 by Les Rao, RN Outcome: Progressing 10/24/2023 2004 by Les Rao, RN Outcome: Progressing   Problem: Safety: Goal: Ability to remain free from injury will improve 10/24/2023 2004 by Les Rao, RN Outcome: Progressing 10/24/2023 2004 by Les Rao, RN Outcome: Progressing   Problem: Skin Integrity: Goal: Risk for impaired skin integrity will decrease 10/24/2023 2004 by Les Rao, RN Outcome: Progressing 10/24/2023 2004 by Les Rao, RN Outcome: Progressing

## 2023-10-25 DIAGNOSIS — J9622 Acute and chronic respiratory failure with hypercapnia: Secondary | ICD-10-CM

## 2023-10-25 DIAGNOSIS — J9621 Acute and chronic respiratory failure with hypoxia: Secondary | ICD-10-CM

## 2023-10-25 LAB — COMPREHENSIVE METABOLIC PANEL WITH GFR
ALT: 12 U/L (ref 0–44)
AST: 19 U/L (ref 15–41)
Albumin: 2.9 g/dL — ABNORMAL LOW (ref 3.5–5.0)
Alkaline Phosphatase: 47 U/L (ref 38–126)
Anion gap: 12 (ref 5–15)
BUN: 18 mg/dL (ref 6–20)
CO2: 28 mmol/L (ref 22–32)
Calcium: 8.8 mg/dL — ABNORMAL LOW (ref 8.9–10.3)
Chloride: 97 mmol/L — ABNORMAL LOW (ref 98–111)
Creatinine, Ser: 0.83 mg/dL (ref 0.44–1.00)
GFR, Estimated: >60 mL/min (ref >60)
Glucose, Bld: 75 mg/dL (ref 70–99)
Potassium: 4.4 mmol/L (ref 3.5–5.1)
Sodium: 137 mmol/L (ref 135–145)
Total Bilirubin: 1.4 mg/dL — ABNORMAL HIGH (ref 0.0–1.2)
Total Protein: 6.9 g/dL (ref 6.5–8.1)

## 2023-10-25 LAB — GLUCOSE, CAPILLARY
Glucose-Capillary: 100 mg/dL — ABNORMAL HIGH (ref 70–99)
Glucose-Capillary: 101 mg/dL — ABNORMAL HIGH (ref 70–99)
Glucose-Capillary: 102 mg/dL — ABNORMAL HIGH (ref 70–99)
Glucose-Capillary: 73 mg/dL (ref 70–99)
Glucose-Capillary: 80 mg/dL (ref 70–99)
Glucose-Capillary: 84 mg/dL (ref 70–99)
Glucose-Capillary: 85 mg/dL (ref 70–99)

## 2023-10-25 LAB — BRAIN NATRIURETIC PEPTIDE: B Natriuretic Peptide: 27.2 pg/mL (ref 0.0–100.0)

## 2023-10-25 LAB — APTT: aPTT: 82 s — ABNORMAL HIGH (ref 24–36)

## 2023-10-25 LAB — POCT I-STAT 7, (LYTES, BLD GAS, ICA,H+H)
Acid-Base Excess: 5 mmol/L — ABNORMAL HIGH (ref 0.0–2.0)
Acid-Base Excess: 7 mmol/L — ABNORMAL HIGH (ref 0.0–2.0)
Bicarbonate: 38.3 mmol/L — ABNORMAL HIGH (ref 20.0–28.0)
Bicarbonate: 35.3 mmol/L — ABNORMAL HIGH (ref 20.0–28.0)
Calcium, Ion: 1.25 mmol/L (ref 1.15–1.40)
Calcium, Ion: 1.2 mmol/L (ref 1.15–1.40)
HCT: 46 % (ref 36.0–46.0)
HCT: 43 % (ref 36.0–46.0)
Hemoglobin: 15.6 g/dL — ABNORMAL HIGH (ref 12.0–15.0)
Hemoglobin: 14.6 g/dL (ref 12.0–15.0)
O2 Saturation: 92 %
O2 Saturation: 90 %
Patient temperature: 97.6
Patient temperature: 97.5
Potassium: 4.5 mmol/L (ref 3.5–5.1)
Potassium: 4.3 mmol/L (ref 3.5–5.1)
Sodium: 138 mmol/L (ref 135–145)
Sodium: 138 mmol/L (ref 135–145)
TCO2: 42 mmol/L — ABNORMAL HIGH (ref 22–32)
TCO2: 37 mmol/L — ABNORMAL HIGH (ref 22–32)
pCO2 arterial: 104.4 mmHg (ref 32–48)
pCO2 arterial: 61.2 mmHg — ABNORMAL HIGH (ref 32–48)
pH, Arterial: 7.169 — CL (ref 7.35–7.45)
pH, Arterial: 7.366 (ref 7.35–7.45)
pO2, Arterial: 82 mmHg — ABNORMAL LOW (ref 83–108)
pO2, Arterial: 60 mmHg — ABNORMAL LOW (ref 83–108)

## 2023-10-25 LAB — CBC
HCT: 43 % (ref 36.0–46.0)
Hemoglobin: 12.7 g/dL (ref 12.0–15.0)
MCH: 28.7 pg (ref 26.0–34.0)
MCHC: 29.5 g/dL — ABNORMAL LOW (ref 30.0–36.0)
MCV: 97.3 fL (ref 80.0–100.0)
Platelets: 210 10*3/uL (ref 150–400)
RBC: 4.42 MIL/uL (ref 3.87–5.11)
RDW: 15.5 % (ref 11.5–15.5)
WBC: 5.5 10*3/uL (ref 4.0–10.5)
nRBC: 0 % (ref 0.0–0.2)

## 2023-10-25 LAB — HEMOGLOBIN A1C
Hgb A1c MFr Bld: 5.1 % (ref 4.8–5.6)
Mean Plasma Glucose: 99.67 mg/dL

## 2023-10-25 MED ORDER — ORAL CARE MOUTH RINSE: 15.0000 mL | OROMUCOSAL | Status: DC | PRN

## 2023-10-25 MED ORDER — HEPARIN (PORCINE) 25000 UT/250ML-% IV SOLN
1600.0000 [IU]/h | INTRAVENOUS | Status: DC
  Administered 2023-10-25: 2150 [IU]/h via INTRAVENOUS
  Administered 2023-10-26: 2000 [IU]/h via INTRAVENOUS
  Administered 2023-10-26: 2150 [IU]/h via INTRAVENOUS
  Filled 2023-10-25 (×4): qty 250

## 2023-10-25 MED ORDER — INSULIN ASPART 100 UNIT/ML IJ SOLN: 0.0000 [IU] | INTRAMUSCULAR | Status: DC

## 2023-10-25 MED ORDER — IPRATROPIUM-ALBUTEROL 0.5-2.5 (3) MG/3ML IN SOLN
3.0000 mL | Freq: Four times a day (QID) | RESPIRATORY_TRACT | Status: DC
  Administered 2023-10-25 (×3): 3 mL via RESPIRATORY_TRACT
  Filled 2023-10-25 (×3): qty 3

## 2023-10-25 MED ORDER — ORAL CARE MOUTH RINSE
15.0000 mL | OROMUCOSAL | Status: DC
  Administered 2023-10-25 – 2023-10-30 (×62): 15 mL via OROMUCOSAL

## 2023-10-25 MED ORDER — STERILE WATER FOR INJECTION IJ SOLN
INTRAMUSCULAR | Status: AC
  Administered 2023-10-25: 10 mL
  Filled 2023-10-25: qty 10

## 2023-10-25 MED ORDER — FUROSEMIDE 10 MG/ML IJ SOLN
40.0000 mg | Freq: Once | INTRAMUSCULAR | Status: AC
  Administered 2023-10-25: 40 mg via INTRAVENOUS
  Filled 2023-10-25: qty 4

## 2023-10-25 NOTE — Consult Note (Signed)
 NAME:  Angel Brewer, MRN:  [id number], DOB:  03/11/1966, LOS: 2 ADMISSION DATE:  10/22/2023 CONSULTATION DATE:  10/23/2023 REFERRING MD:  [place] - [facility] CHIEF COMPLAINT: Fall, acute-on-chronic respiratory failure   History of Present Illness:  58 year old woman who presented to [facility] ED 4/10 for fall and AoC RF. PMHx significant for HTN, HLD, chronic diastolic CHF (Echo 11/2021 with EF 60-65%, mild LVH), OHS/OSA s/p tracheostomy 11/2021 (on CPAP/nocturnal O2, followed by Sheryle Donning, NP) with chronic hypoxic and hypercarbic respiratory failure, DVT/PE (on Eliquis). Recent hospitalization 08/2023 for AoC RF, due to follow up in trach clinic around current time (~6 weeks from discharge date).   History is obtained primarily from chart review. Patient reportedly had a fall from the toilet around 1000 4/10AM and was unable to get up from the bathroom floor; experienced L leg pain and R shoulder pain. EMS was called and on arrival she was noted to be hypoxic on RA (SpO2 60s) and was placed on NRB 15L with SpO2 improved to 98%. Denied LOC/head injury.  On ED arrival, patient was afebrile, HR 96, BP 165/118, RR 25, SpO2 100% on NRB. CXR showed no active disease. CT Head/C-spine with NAICA, no acute displaced fracture/traumatic listhesis of C-spine. CT Chest/A/P showed new endplate deformity of L1, nondisplaced fractures of the R clavicular head, nondisplaced fractures of the proximal L humerus, tracheostomy. XR of L Femur demonstrated acute displaced tibial plateau intercondylar fracture and tricompartmental degenerative changes of the L knee.  Labs were notable for CBC WNL, INR 1.1, Na 138, K 4.5, CO2 30, Cr 0.96, AST/ALT/Alk Phos WNL, Tbili 1.7. CK 385, LA 1.6. Ethanol < 10. Initial VBG with pH 7.215/pCO2 93.7, bicarb 37.8. Patient was deemed to require mechanical ventilation and tracheostomy was exchanged by RT from #6 Shiley cuffless to #6 Shiley cuffed tracheostomy. Patient was subsequently placed on MV.  Repeat ABG improved to 7.349/pCO2 64.8/pO2 63/bicarb 35.7.  PCCM consulted for evaluation and management.  Angel Brewer states she felt dizzy prior to her fall, then was too weak too stand up (later found to have fractures as well). She feels like she has been sleeping much more lately and that her energy really never recovered from her hospitalization in February. She otherwise felt she had been doing well, no issues with her oxygenation or tracheostomy. Denies fever/chills, CP/SOB, n/v/d, recent sick contacts.  Pertinent Medical History:   Past Medical History:  Diagnosis Date   Chronic diastolic CHF (congestive heart failure) (HCC) 01/05/2022   DVT (deep vein thrombosis) in pregnancy    RLE DVT 02/2016   Dyspnea    Essential hypertension 08/25/2017   Hyperlipidemia 11/21/2021   Morbid obesity (HCC)    Obesity hypoventilation syndrome (HCC) 05/03/2021   PE (pulmonary embolism) 02/29/2016   Sleep apnea    uses a cpap   Tracheostomy present (HCC) 01/05/2022   Size 6 shiley/cuffless  Last changed: 10/9     Discussion  Stable trach.  She does get a Coss more short of breath with exertion.  Although I did not observe it on my exam she does endorse she will occasionally have a rush of air after removing PMV following activity.  This could suggest some mild air trapping from the trach.  Otherwise she is doing fantastic with the trach     Plan  Cont routin   Significant Hospital Events: Including procedures, antibiotic start and stop dates in addition to other pertinent events   4/10 - Presented to [facility] after fall. Hypoxic on  RA with SpO2 60s per EMS, placed on NRB with improvement. Initial VBG poor with pH 7.219 4/11 prompt improvement, moved to trach collar and transferred out.  Interim History / Subjective:  Brought back to ICU for hypercapnia after receiving Tramadol for pain.  More awake this morning, but still hypoxic. Improved with diuresis and transitioned back to trach collar . Pain  control has improved. Mainly shoulder when she moves.  Objective:  Blood pressure (!) 140/58, pulse 76, temperature 100 F (37.8 C), temperature source Oral, resp. rate (!) 23, height 5\' 6"  (1.676 m), weight (!) 189.4 kg, last menstrual period 11/29/2015, SpO2 92%.    Vent Mode: PSV FiO2 (%):  [40 %-70 %] 40 % Set Rate:  [22 bmp-28 bmp] 28 bmp Vt Set:  [470 mL] 470 mL PEEP:  [5 cmH20] 5 cmH20 Pressure Support:  [8 cmH20-10 cmH20] 8 cmH20 Plateau Pressure:  [29 cmH20] 29 cmH20   Intake/Output Summary (Last 24 hours) at 10/25/2023 2140 Last data filed at 10/25/2023 1800 Gross per 24 hour  Intake 247.12 ml  Output 2300 ml  Net -2052.88 ml   Filed Weights   10/22/23 2044 10/25/23 0500  Weight: (!) 178 kg (!) 189.4 kg   Physical Examination: General: Acute-on-chronically ill-appearing middle-aged woman in NAD. HEENT: /AT, anicteric sclera, PERRL, slightly dry mucous membranes. Neck: Tracheostomy present and midline without erythema/drainage, #6 Shiley cuffed (changed today). PMV on Neuro: Awake, oriented x 4. Responds to verbal stimuli. Following commands consistently. Moves all 4 extremities spontaneously.  CV: HS normal. PULM: Chest clear, no distress GI: Obese, soft, nontender, nondistended. Normoactive bowel sounds. Extremities: No significant LE edema noted. Skin: Warm/dry, no rashes.  Ancillary Tests:  Back to baseline CO2 with overnight ventilation.  Assessment & Plan:  Acute-on-chronic hypoxic and hypercarbic respiratory failure Acute mechanical ventilation need in the setting of respiratory failure Tracheostomy status, 11/2021 OSA/OHS Mechanical fall with multiple fractures (R clavicle, L tibial plateau, L1 vertebrae) History of PE/DVT  Plan:  - Resume trach collar. Monitor for recurrent hypercapnia. Patient normally doesn't ventilate at night time, but patient is spending more time in bed impairing respiratory mechanics. May have developed central apnea. - Avoid  narcotics. - Heparin IV for now as surgeries are still pending. - Mobilize as able.  Best Practice (right click and "Reselect all SmartList Selections" daily)   Diet/type: NPO - resume oral diet  DVT prophylaxis: SCDs, hold AC in the setting of possible need for surgical intervention GI prophylaxis: PPI Central venous access:  N/A Foley:  N/A Code Status:  full code Last date of multidisciplinary goals of care discussion [Pending]   Critical care time:   The patient is critically ill with multiple organ system failure and requires high complexity decision making for assessment and support, frequent evaluation and titration of therapies, advanced monitoring, review of radiographic studies and interpretation of complex data.   Critical Care Time devoted to patient care services, exclusive of separately billable procedures, described in this note is 35 minutes.  Arlina Lair, MD [facility] 10/25/23 9:40 PM  Please see [email or web address] for pager details.  From 7A-7P if no response, please call [phone] After hours, please call ELink [phone]

## 2023-10-25 NOTE — Progress Notes (Signed)
 PHARMACY - ANTICOAGULATION CONSULT NOTE  Pharmacy Consult for heparin Indication:  h/o VTE  No Known Allergies  Patient Measurements: Height: 5\' 6"  (167.6 cm) Weight: (!) 178 kg (392 lb 6.7 oz) IBW/kg (Calculated) : 59.3 HEPARIN DW (KG): 105.3  Vital Signs: Temp: 96.9 F (36.1 C) (04/12 2326) Temp Source: Axillary (04/12 2326) BP: 166/70 (04/12 2326) Pulse Rate: 84 (04/12 2326)  Labs: Recent Labs    10/22/23 2108 10/23/23 0207 10/23/23 0408 10/23/23 0519 10/23/23 1018 10/23/23 1758 10/24/23 0711 10/24/23 1430  HGB 13.4   < > 12.5 13.9  --   --  13.0  --   HCT 44.7   < > 42.2 41.0  --   --  43.7  --   PLT 236  --  205  --   --   --  233  --   APTT  --   --   --   --    < > 61* 38* 63*  LABPROT 14.7  --   --   --   --   --   --   --   INR 1.1  --   --   --   --   --   --   --   HEPARINUNFRC  --   --   --   --    < > 0.66 0.43 0.58  CREATININE 0.96  --  0.98  --   --   --  1.00  --   CKTOTAL 385*  --   --   --   --   --   --   --    < > = values in this interval not displayed.    Estimated Creatinine Clearance: 103.4 mL/min (by C-G formula based on SCr of 1 mg/dL).   Medical History: Past Medical History:  Diagnosis Date   Chronic diastolic CHF (congestive heart failure) (HCC) 01/05/2022   DVT (deep vein thrombosis) in pregnancy    RLE DVT 02/2016   Dyspnea    Essential hypertension 08/25/2017   Hyperlipidemia 11/21/2021   Morbid obesity (HCC)    Obesity hypoventilation syndrome (HCC) 05/03/2021   PE (pulmonary embolism) 02/29/2016   Sleep apnea    uses a cpap   Tracheostomy present (HCC) 01/05/2022   Size 6 shiley/cuffless  Last changed: 10/9     Discussion  Stable trach.  She does get a [place] more short of breath with exertion.  Although I did not observe it on my exam she does endorse she will occasionally have a rush of air after removing PMV following activity.  This could suggest some mild air trapping from the trach.  Otherwise she is doing fantastic  with the trach     Plan  Cont routin    Assessment: 58yo female admitted for multiple fractures after sustaining a fall, to transition from apixaban (last dose 4/10 8a) to heparin; admitting MD would like to keep goal at lower end. 4/12 was transitioned back to eliquis, but given acute decompensation and on vent will transition back to heparin gtt.  -Apixaban given at 4/12 ~1800 -CBC stable  Goals of Therapy  Heparin level 0.3-0.5 units/ml aPTT 66-85 seconds Monitor platelets by anticoagulation protocol: Yes   Plan:  Start heparin infusion at 2150 units/hr at 0600 on 4/13 given eliquis in evening of 4/12 Check aPTT in 6 hours after heparin start Monitor heparin levels, aPTT (while DOAC affects anti-Xa assay), and CBC.  Young Hensen, PharmD. Clinical Pharmacist 10/25/2023  12:10 AM

## 2023-10-25 NOTE — Progress Notes (Signed)
 Patient went back to ICU on ventilator overnight.  Case discussed with PCCM.  Patient will stay in ICU until stabilized.  TRH will not follow patient.  PCCM to call back for transfer again when patient is stable.   Patient not examined.  No charge.

## 2023-10-25 NOTE — Progress Notes (Signed)
 Trach change performed due to blown cuff. Upon arrival to see patient she was able to talk to me & was not getting her Vts on the ventilator. Cuff was inflated multiple times & she continued to talk to us  & not get Vts on ventilator. [name] was successfully changed by me & Evan Hillock RRT with no issues. Cuff was inflated & patient placed back on the ventilator with good return Vts on vent. Trach placement was confirmed by positive color change on the EZCAP manometer, bilateral BS, chest rise, good return Vts on ventilator & she is now unable to speak with cuff inflated. 6 cuffed shiley was placed.

## 2023-10-25 NOTE — Progress Notes (Signed)
 PHARMACY - ANTICOAGULATION CONSULT NOTE  Pharmacy Consult for heparin Indication:  h/o VTE  No Known Allergies  Patient Measurements: Height: 5\' 6"  (167.6 cm) Weight: (!) 189.4 kg (417 lb 8.8 oz) IBW/kg (Calculated) : 59.3 HEPARIN DW (KG): 105.3  Vital Signs: Temp: 98.7 F (37.1 C) (04/13 1200) Temp Source: Axillary (04/13 1200) BP: 145/64 (04/13 1300) Pulse Rate: 80 (04/13 1300)  Labs: Recent Labs    10/22/23 2108 10/23/23 0207 10/23/23 0408 10/23/23 0519 10/23/23 1758 10/24/23 0711 10/24/23 1430 10/25/23 0139 10/25/23 0448 10/25/23 0729 10/25/23 1305  HGB 13.4   < > 12.5   < >  --  13.0  --  15.6* 14.6 12.7  --   HCT 44.7   < > 42.2   < >  --  43.7  --  46.0 43.0 43.0  --   PLT 236  --  205  --   --  233  --   --   --  210  --   APTT  --   --   --    < > 61* 38* 63*  --   --   --  82*  LABPROT 14.7  --   --   --   --   --   --   --   --   --   --   INR 1.1  --   --   --   --   --   --   --   --   --   --   HEPARINUNFRC  --   --   --    < > 0.66 0.43 0.58  --   --   --   --   CREATININE 0.96  --  0.98  --   --  1.00  --   --   --  0.83  --   CKTOTAL 385*  --   --   --   --   --   --   --   --   --   --    < > = values in this interval not displayed.    Estimated Creatinine Clearance: 129.8 mL/min (by C-G formula based on SCr of 0.83 mg/dL).   Medical History: Past Medical History:  Diagnosis Date   Chronic diastolic CHF (congestive heart failure) (HCC) 01/05/2022   DVT (deep vein thrombosis) in pregnancy    RLE DVT 02/2016   Dyspnea    Essential hypertension 08/25/2017   Hyperlipidemia 11/21/2021   Morbid obesity (HCC)    Obesity hypoventilation syndrome (HCC) 05/03/2021   PE (pulmonary embolism) 02/29/2016   Sleep apnea    uses a cpap   Tracheostomy present (HCC) 01/05/2022   Size 6 shiley/cuffless  Last changed: 10/9     Discussion  Stable trach.  She does get a [place] more short of breath with exertion.  Although I did not observe it on my exam she  does endorse she will occasionally have a rush of air after removing PMV following activity.  This could suggest some mild air trapping from the trach.  Otherwise she is doing fantastic with the trach     Plan  Cont routin    Assessment: 58yo female admitted for multiple fractures after sustaining a fall, to transition from apixaban (last dose 4/10 8a) to heparin; admitting MD would like to keep goal at lower end. 4/12 was transitioned back to eliquis, but given acute decompensation and on vent will transition back  to heparin infusion. Apixaban was given at 4/12 ~1800.   aPTT 82 - therapeutic for goal of 66-85 on current heparin rate of 2150 units/ hour.  CBC is stable.  No bleeding reported.   Goals of Therapy  Heparin level 0.3-0.5 units/ml aPTT 66-85 seconds Monitor platelets by anticoagulation protocol: Yes   Plan:  Continue heparin infusion at 2150 units/ hour. Monitor daily aPTT  (while DOAC affects anti-Xa assay), heparin levels, and CBC.  Lenard Quam, PharmD, BCPS, BCCCP Please refer to [facility] for [facility] Pharmacy numbers 10/25/2023 1:52 PM

## 2023-10-26 LAB — GLUCOSE, CAPILLARY
Glucose-Capillary: 95 mg/dL (ref 70–99)
Glucose-Capillary: 90 mg/dL (ref 70–99)
Glucose-Capillary: 69 mg/dL — ABNORMAL LOW (ref 70–99)
Glucose-Capillary: 93 mg/dL (ref 70–99)
Glucose-Capillary: 107 mg/dL — ABNORMAL HIGH (ref 70–99)
Glucose-Capillary: 96 mg/dL (ref 70–99)
Glucose-Capillary: 112 mg/dL — ABNORMAL HIGH (ref 70–99)

## 2023-10-26 LAB — CBC
HCT: 45.1 % (ref 36.0–46.0)
Hemoglobin: 13.3 g/dL (ref 12.0–15.0)
MCH: 29 pg (ref 26.0–34.0)
MCHC: 29.5 g/dL — ABNORMAL LOW (ref 30.0–36.0)
MCV: 98.3 fL (ref 80.0–100.0)
Platelets: 174 10*3/uL (ref 150–400)
RBC: 4.59 MIL/uL (ref 3.87–5.11)
RDW: 15.6 % — ABNORMAL HIGH (ref 11.5–15.5)
WBC: 5.1 10*3/uL (ref 4.0–10.5)
nRBC: 0 % (ref 0.0–0.2)

## 2023-10-26 LAB — BASIC METABOLIC PANEL WITH GFR
Anion gap: 6 (ref 5–15)
BUN: 15 mg/dL (ref 6–20)
CO2: 37 mmol/L — ABNORMAL HIGH (ref 22–32)
Calcium: 9.2 mg/dL (ref 8.9–10.3)
Chloride: 92 mmol/L — ABNORMAL LOW (ref 98–111)
Creatinine, Ser: 0.77 mg/dL (ref 0.44–1.00)
GFR, Estimated: >60 mL/min (ref >60)
Glucose, Bld: 95 mg/dL (ref 70–99)
Potassium: 4.5 mmol/L (ref 3.5–5.1)
Sodium: 135 mmol/L (ref 135–145)

## 2023-10-26 LAB — APTT
aPTT: 111 s — ABNORMAL HIGH (ref 24–36)
aPTT: 99 s — ABNORMAL HIGH (ref 24–36)
aPTT: 104 s — ABNORMAL HIGH (ref 24–36)

## 2023-10-26 MED ORDER — ACETAMINOPHEN 160 MG/5ML PO SOLN
500.0000 mg | Freq: Three times a day (TID) | ORAL | Status: DC | PRN
  Administered 2023-10-28: 500 mg via ORAL
  Filled 2023-10-26 (×2): qty 20.3

## 2023-10-26 MED ORDER — PANTOPRAZOLE SODIUM 40 MG PO TBEC
40.0000 mg | DELAYED_RELEASE_TABLET | Freq: Every day | ORAL | Status: DC
  Administered 2023-10-29 – 2023-11-25 (×27): 40 mg via ORAL
  Filled 2023-10-26 (×29): qty 1

## 2023-10-26 MED ORDER — GLUCOSE 40 % PO GEL: 1.0000 | ORAL | Status: DC

## 2023-10-26 MED ORDER — FUROSEMIDE 20 MG PO TABS
20.0000 mg | ORAL_TABLET | Freq: Once | ORAL | Status: AC
  Administered 2023-10-26: 20 mg via ORAL
  Filled 2023-10-26: qty 1

## 2023-10-26 NOTE — Progress Notes (Signed)
 PHARMACY - ANTICOAGULATION CONSULT NOTE  Pharmacy Consult for heparin Indication:  h/o VTE  No Known Allergies  Patient Measurements: Height: 5\' 6"  (167.6 cm) Weight: (!) 189.4 kg (417 lb 8.8 oz) IBW/kg (Calculated) : 59.3 HEPARIN DW (KG): 105.3  Vital Signs: Temp: 99.6 F (37.6 C) (04/14 2000) Temp Source: Axillary (04/14 2000) BP: 117/60 (04/14 2000) Pulse Rate: 61 (04/14 2000)  Labs: Recent Labs    10/24/23 0711 10/24/23 1430 10/25/23 0139 10/25/23 0448 10/25/23 0729 10/25/23 1305 10/26/23 0446 10/26/23 0723 10/26/23 1043 10/26/23 1311 10/26/23 1952  HGB 13.0  --    < > 14.6 12.7  --  13.3  --   --   --   --   HCT 43.7  --    < > 43.0 43.0  --  45.1  --   --   --   --   PLT 233  --   --   --  210  --  174  --   --   --   --   APTT 38* 63*  --   --   --    < >  --  111* 99*  --  104*  HEPARINUNFRC 0.43 0.58  --   --   --   --   --   --   --   --   --   CREATININE 1.00  --   --   --  0.83  --   --   --   --  0.77  --    < > = values in this interval not displayed.    Estimated Creatinine Clearance: 134.7 mL/min (by C-G formula based on SCr of 0.77 mg/dL).   Medical History: Past Medical History:  Diagnosis Date   Chronic diastolic CHF (congestive heart failure) (HCC) 01/05/2022   DVT (deep vein thrombosis) in pregnancy    RLE DVT 02/2016   Dyspnea    Essential hypertension 08/25/2017   Hyperlipidemia 11/21/2021   Morbid obesity (HCC)    Obesity hypoventilation syndrome (HCC) 05/03/2021   PE (pulmonary embolism) 02/29/2016   Sleep apnea    uses a cpap   Tracheostomy present (HCC) 01/05/2022   Size 6 shiley/cuffless  Last changed: 10/9     Discussion  Stable trach.  She does get a [place] more short of breath with exertion.  Although I did not observe it on my exam she does endorse she will occasionally have a rush of air after removing PMV following activity.  This could suggest some mild air trapping from the trach.  Otherwise she is doing fantastic with  the trach     Plan  Cont routin    Assessment: 58yo female admitted for multiple fractures after sustaining a fall, to transition from apixaban (last dose 4/10 8a) to heparin; admitting MD would like to keep goal at lower end. 4/12 was transitioned back to eliquis, but given acute decompensation and on vent will transition back to heparin infusion. Apixaban was given at 4/12 ~1800.   aPTT up to 104 - supratherapeutic for goal of 66-85 on current heparin rate of 2000 units/ hour. aPTT actually up a bit after rate decreased. No bleeding noted.  Goals of Therapy  Heparin level 0.3-0.5 units/ml aPTT 66-85 seconds Monitor platelets by anticoagulation protocol: Yes   Plan:  Reduce heparin infusion to 1800 units/ hour.  Repeat aPTT in 6 hours after adjustment.   Enrigue Harvard, PharmD, BCPS Please see amion for complete clinical pharmacist  phone list 10/26/2023 9:02 PM

## 2023-10-26 NOTE — Plan of Care (Signed)
  Problem: Coping: Goal: Ability to adjust to condition or change in health will improve Outcome: Progressing   Problem: Fluid Volume: Goal: Ability to maintain a balanced intake and output will improve Outcome: Progressing   Problem: Health Behavior/Discharge Planning: Goal: Ability to identify and utilize available resources and services will improve Outcome: Progressing Goal: Ability to manage health-related needs will improve Outcome: Progressing   Problem: Metabolic: Goal: Ability to maintain appropriate glucose levels will improve Outcome: Progressing   Problem: Skin Integrity: Goal: Risk for impaired skin integrity will decrease Outcome: Progressing

## 2023-10-26 NOTE — Progress Notes (Signed)
 4/14/BMET reviewed Na 135/ K 4.5/ Cl 92/ CO2 37, BUN 15, Creatinine 0.77  Consider Vent at HS.

## 2023-10-26 NOTE — Progress Notes (Signed)
 Speech Language Pathology Treatment: Angel Brewer Speaking valve  Patient Details Name: Angel Brewer MRN: [id number] DOB: 06/18/1966 Today's Date: 10/26/2023 Time: 1000-1018 SLP Time Calculation (min) (ACUTE ONLY): 18 min  Assessment / Plan / Recommendation Clinical Impression  Skilled therapy session focused on PMSV management and tolerance. Cuff deflated upon SLP arrival. SLP donned PMSV and patient with adequate phonation. Increased tracheal secretions noted this date compared to initial evaluation. Patient with occasional drop in O2 sats (ranged from 87-92), though seemingly regardless of whether PMSV was donned/doffed. No backflow present during removal of PMSV upon inhale, however patient unable to independently don/doff PMSV if needed. Due to physical limitations, recommend continuation of PMSV only with supervision from nursing/therapy. PMSV removed upon SLP exit and SLP re-enforced education regarding wear of PMSV during PO intake.   HPI HPI: Patient is a 58 year old F who presented to the ED after a fall in her home resulting in L1 fx. PMH CHF, obesity hypoventilation syndrome s/p trach, OSA, h/o PE on Eliquis. Placed on trach collar (4/11), though returned to vent after episode of unresposiveness and was administered narcan. Returned to ICU from floor. On trach collar again today 4/14      SLP Plan  Continue with current plan of care      Recommendations for follow up therapy are one component of a multi-disciplinary discharge planning process, led by the attending physician.  Recommendations may be updated based on patient status, additional functional criteria and insurance authorization.    Recommendations  Diet recommendations: Dysphagia 2 (fine chop);Thin liquid Liquids provided via: Cup;Straw Medication Administration: Whole meds with puree Compensations: Minimize environmental distractions;Slow rate;Small sips/bites Postural Changes and/or Swallow Maneuvers: Seated  upright 90 degrees      Patient may use Passy-Muir Speech Valve: During all therapies with supervision;During PO intake/meals PMSV Supervision: Full MD: Please consider changing trach tube to : Cuffless           Oral care BID   Intermittent Supervision/Assistance Aphonia (R49.1);Dysphagia, unspecified (R13.10)     Continue with current plan of care     Angel Brewer M.A., CCC-SLP 10/26/2023, 11:44 AM

## 2023-10-26 NOTE — Progress Notes (Signed)
 Orthopedic Tech Progress Note Patient Details:  Angel Brewer October 20, 1965 [id number]  Ortho Devices Type of Ortho Device: Sling immobilizer Ortho Device/Splint Location: RUE Ortho Device/Splint Interventions: Ordered, Application, Adjustment   Post Interventions Patient Tolerated: Fair Instructions Provided: Care of device  Kermitt Pedlar 10/26/2023, 10:37 AM

## 2023-10-26 NOTE — Evaluation (Signed)
 Clinical/Bedside Swallow Evaluation Patient Details  Name: Angel Brewer MRN: [id number] Date of Birth: 1966-06-13  Today's Date: 10/26/2023 Time: SLP Start Time (ACUTE ONLY): 0945 SLP Stop Time (ACUTE ONLY): 1000 SLP Time Calculation (min) (ACUTE ONLY): 15 min  Past Medical History:  Past Medical History:  Diagnosis Date   Chronic diastolic CHF (congestive heart failure) (HCC) 01/05/2022   DVT (deep vein thrombosis) in pregnancy    RLE DVT 02/2016   Dyspnea    Essential hypertension 08/25/2017   Hyperlipidemia 11/21/2021   Morbid obesity (HCC)    Obesity hypoventilation syndrome (HCC) 05/03/2021   PE (pulmonary embolism) 02/29/2016   Sleep apnea    uses a cpap   Tracheostomy present (HCC) 01/05/2022   Size 6 shiley/cuffless  Last changed: 10/9     Discussion  Stable trach.  She does get a [place] more short of breath with exertion.  Although I did not observe it on my exam she does endorse she will occasionally have a rush of air after removing PMV following activity.  This could suggest some mild air trapping from the trach.  Otherwise she is doing fantastic with the trach     Plan  Cont routin   Past Surgical History:  Past Surgical History:  Procedure Laterality Date   CESAREAN SECTION     x 3   OPEN REDUCTION INTERNAL FIXATION (ORIF) DISTAL RADIAL FRACTURE Right 06/20/2019   Procedure: OPEN REDUCTION INTERNAL FIXATION (ORIF) DISTAL RADIAL FRACTURE;  Surgeon: Rober Chimera, MD;  Location: [facility] OR;  Service: Orthopedics;  Laterality: Right;  REGIONAL BLOCK TOTAL SURGERY TIME: 90 MINUTES   SYNOVECTOMY Right 11/21/2019   Procedure: RIGHT WRIST LIGAMENT RECONSTRUCTION;  Surgeon: Rober Chimera, MD;  Location: [facility] OR;  Service: Orthopedics;  Laterality: Right;  PROCEDURE: RIGHT WRIST LIGAMENT RECONSTRUCTION LENGTH OF SURGERY: 105 MIN   HPI:  Patient is a 58 year old F who presented to the ED after a fall in her home resulting in L1 fx. PMH CHF, obesity hypoventilation  syndrome s/p trach, OSA, h/o PE on Eliquis. Placed on trach collar (4/11), though returned to vent after episode of unresposiveness and was administered narcan. Returned to ICU from floor. On trach collar again today 4/14    Assessment / Plan / Recommendation  Clinical Impression  A bedside swallow evaluation was completed to assess for s/sx of oropharyngeal dysphagia. Oral mechanism exam WFL though edentulous with dentures unavailable. PMSV donned prior to PO administration. POs administered included ice chips, thin liquids via spoon, cup and straw, purees and solids. Patient with mildly prolonged mastication likely due to edentulous state. (Patient reports usually wearing dentures during meals, though unavailable at this time). Patient with occasional coughing, though likely due to tracheostomy and increased secretions rather than bolus misdirection. PTA patient with tracheostomy consuming regular/thin diet. Recommend initiation of dysphagia 2/thin liquids and medications whole vs crushed per patient preference. Patient should wear PMSV during all PO intake, sit upright at 90 degrees and take small bites/sips.  SLP Visit Diagnosis: Aphonia (R49.1);Dysphagia, unspecified (R13.10)    Aspiration Risk  Mild aspiration risk    Diet Recommendation Dysphagia 2 (Fine chop);Thin liquid    Liquid Administration via: Cup Medication Administration: Whole meds with puree Supervision: Staff to assist with self feeding Compensations: Minimize environmental distractions;Slow rate;Small sips/bites (PMSV on) Postural Changes: Seated upright at 90 degrees    Other  Recommendations Oral Care Recommendations: Oral care BID    Recommendations for follow up therapy are one component of a multi-disciplinary  discharge planning process, led by the attending physician.  Recommendations may be updated based on patient status, additional functional criteria and insurance authorization.  Follow up Recommendations Home  health SLP      Assistance Recommended at Discharge    Functional Status Assessment Patient has had a recent decline in their functional status and demonstrates the ability to make significant improvements in function in a reasonable and predictable amount of time.  Frequency and Duration min 2x/week  2 weeks       Prognosis Prognosis for improved oropharyngeal function: Good      Swallow Study   General HPI: Patient is a 58 year old F who presented to the ED after a fall in her home resulting in L1 fx. PMH CHF, obesity hypoventilation syndrome s/p trach, OSA, h/o PE on Eliquis. Placed on trach collar (4/11), though returned to vent after episode of unresposiveness and was administered narcan. Returned to ICU from floor. On trach collar again today 4/14 Type of Study: Bedside Swallow Evaluation Diet Prior to this Study: NPO Temperature Spikes Noted: No Respiratory Status: Trach Collar (12L) Behavior/Cognition: Alert;Cooperative Oral Cavity Assessment: Within Functional Limits Oral Care Completed by SLP: Recent completion by staff Oral Cavity - Dentition: Edentulous Vision: Functional for self-feeding Self-Feeding Abilities: Needs assist Patient Positioning: Upright in bed Baseline Vocal Quality: Normal Volitional Cough: Weak Volitional Swallow: Able to elicit    Oral/Motor/Sensory Function Overall Oral Motor/Sensory Function: Within functional limits   Ice Chips Ice chips: Within functional limits Presentation: Spoon   Thin Liquid Thin Liquid: Within functional limits Presentation: Cup;Straw    Nectar Thick Nectar Thick Liquid: Not tested   Honey Thick Honey Thick Liquid: Not tested   Puree Puree: Within functional limits Presentation: Self Fed;Spoon   Solid     Solid: Within functional limits Presentation: Self Fed      Angel Brewer M.A., CCC-SLP 10/26/2023,11:32 AM

## 2023-10-26 NOTE — TOC CM/SW Note (Signed)
 Transition of Care [facility]) - Inpatient Brief Assessment   Patient Details  Name: Angel Brewer MRN: [id number] Date of Birth: Jul 04, 1966  Transition of Care [facility]) CM/SW Contact:    Jannice Mends, LCSW Phone Number: 10/26/2023, 4:08 PM   Clinical Narrative: Patient admitted from home with chronic trach stoma and pm O2. She is currently intubated but is uninsured. TOC following for needs.    Transition of Care Asessment: Insurance and Status: Insurance coverage has been reviewed, Selfpay Patient has primary care physician: Yes Home environment has been reviewed: From home Prior level of function:: Independent   Social Drivers of Health Review: SDOH reviewed no interventions necessary Readmission risk has been reviewed: Yes Transition of care needs: transition of care needs identified, TOC will continue to follow

## 2023-10-26 NOTE — Progress Notes (Signed)
 PHARMACY - ANTICOAGULATION CONSULT NOTE  Pharmacy Consult for heparin Indication:  h/o VTE  No Known Allergies  Patient Measurements: Height: 5\' 6"  (167.6 cm) Weight: (!) 189.4 kg (417 lb 8.8 oz) IBW/kg (Calculated) : 59.3 HEPARIN DW (KG): 105.3  Vital Signs: Temp: 98.9 F (37.2 C) (04/14 0800) Temp Source: Oral (04/14 0800) BP: 163/66 (04/14 1000) Pulse Rate: 64 (04/14 1000)  Labs: Recent Labs    10/23/23 1758 10/24/23 0711 10/24/23 1430 10/25/23 0139 10/25/23 0448 10/25/23 0729 10/25/23 1305 10/26/23 0446 10/26/23 0723  HGB  --  13.0  --    < > 14.6 12.7  --  13.3  --   HCT  --  43.7  --    < > 43.0 43.0  --  45.1  --   PLT  --  233  --   --   --  210  --  174  --   APTT 61* 38* 63*  --   --   --  82*  --  111*  HEPARINUNFRC 0.66 0.43 0.58  --   --   --   --   --   --   CREATININE  --  1.00  --   --   --  0.83  --   --   --    < > = values in this interval not displayed.    Estimated Creatinine Clearance: 129.8 mL/min (by C-G formula based on SCr of 0.83 mg/dL).   Medical History: Past Medical History:  Diagnosis Date   Chronic diastolic CHF (congestive heart failure) (HCC) 01/05/2022   DVT (deep vein thrombosis) in pregnancy    RLE DVT 02/2016   Dyspnea    Essential hypertension 08/25/2017   Hyperlipidemia 11/21/2021   Morbid obesity (HCC)    Obesity hypoventilation syndrome (HCC) 05/03/2021   PE (pulmonary embolism) 02/29/2016   Sleep apnea    uses a cpap   Tracheostomy present (HCC) 01/05/2022   Size 6 shiley/cuffless  Last changed: 10/9     Discussion  Stable trach.  She does get a [place] more short of breath with exertion.  Although I did not observe it on my exam she does endorse she will occasionally have a rush of air after removing PMV following activity.  This could suggest some mild air trapping from the trach.  Otherwise she is doing fantastic with the trach     Plan  Cont routin    Assessment: 58yo female admitted for multiple fractures  after sustaining a fall, to transition from apixaban (last dose 4/10 8a) to heparin; admitting MD would like to keep goal at lower end. 4/12 was transitioned back to eliquis, but given acute decompensation and on vent will transition back to heparin infusion. Apixaban was given at 4/12 ~1800.   aPTT 99 - supratherapeutic for goal of 66-85 on current heparin rate of 2150 units/ hour.  CBC is stable No bleeding reported.   Goals of Therapy  Heparin level 0.3-0.5 units/ml aPTT 66-85 seconds Monitor platelets by anticoagulation protocol: Yes   Plan:  Reduce heparin infusion to 2000 units/ hour.  Repeat aPTT in 6 hours after adjustment.  Monitor daily aPTT (while DOAC affects anti-Xa assay), heparin levels, and CBC. Heparin level ordered for tomorrow to assess for correlation  Cedric Fishman, PharmD, BCPS, BCCCP Clinical Pharmacist

## 2023-10-26 NOTE — Progress Notes (Signed)
 NAME:  Angel Brewer, MRN:  [id number], DOB:  1965/12/17, LOS: 3 ADMISSION DATE:  10/22/2023 CONSULTATION DATE:  10/23/2023 REFERRING MD:  Angel Brewer - TRH CHIEF COMPLAINT: Fall, acute-on-chronic respiratory failure   History of Present Illness:  58 year old woman who presented to [facility] ED 4/10 for fall and AoC RF. PMHx significant for HTN, HLD, chronic diastolic CHF (Echo 11/2021 with EF 60-65%, mild LVH), OHS/OSA s/p tracheostomy 11/2021 (on CPAP/nocturnal O2, followed by Angel Simmonds, NP) with chronic hypoxic and hypercarbic respiratory failure, DVT/PE (on Eliquis). Recent hospitalization 08/2023 for AoC RF, due to follow up in trach clinic around current time (~6 weeks from discharge date).   History is obtained primarily from chart review. Patient reportedly had a fall from the toilet around 1000 4/10AM and was unable to get up from the bathroom floor; experienced L leg pain and R shoulder pain. EMS was called and on arrival she was noted to be hypoxic on RA (SpO2 60s) and was placed on NRB 15L with SpO2 improved to 98%. Denied LOC/head injury.  On ED arrival, patient was afebrile, HR 96, BP 165/118, RR 25, SpO2 100% on NRB. CXR showed no active disease. CT Head/C-spine with NAICA, no acute displaced fracture/traumatic listhesis of C-spine. CT Chest/A/P showed new endplate deformity of L1, nondisplaced fractures of the R clavicular head, nondisplaced fractures of the proximal L humerus, tracheostomy. XR of L Femur demonstrated acute displaced tibial plateau intercondylar fracture and tricompartmental degenerative changes of the L knee.  Labs were notable for CBC WNL, INR 1.1, Na 138, K 4.5, CO2 30, Cr 0.96, AST/ALT/Alk Phos WNL, Tbili 1.7. CK 385, LA 1.6. Ethanol < 10. Initial VBG with pH 7.215/pCO2 93.7, bicarb 37.8. Patient was deemed to require mechanical ventilation and tracheostomy was exchanged by RT from #6 Shiley cuffless to #6 Shiley cuffed tracheostomy. Patient was subsequently placed on MV.  Repeat ABG improved to 7.349/pCO2 64.8/pO2 63/bicarb 35.7.  PCCM consulted for evaluation and management.  [name] states she felt dizzy prior to her fall, then was too weak too stand up (later found to have fractures as well). She feels like she has been sleeping much more lately and that her energy really never recovered from her hospitalization in February. She otherwise felt she had been doing well, no issues with her oxygenation or tracheostomy. Denies fever/chills, CP/SOB, n/v/d, recent sick contacts.  On 4/12 overnight patient on floors trach collar. Was initially Aox4. Throughout the night became unresponsive. Given narcan with some improvement in mentation but still confused. ABG showing co2 >100. Transferred to ICU and placed on vent.   Pertinent Medical History:   Past Medical History:  Diagnosis Date   Chronic diastolic CHF (congestive heart failure) (HCC) 01/05/2022   DVT (deep vein thrombosis) in pregnancy    RLE DVT 02/2016   Dyspnea    Essential hypertension 08/25/2017   Hyperlipidemia 11/21/2021   Morbid obesity (HCC)    Obesity hypoventilation syndrome (HCC) 05/03/2021   PE (pulmonary embolism) 02/29/2016   Sleep apnea    uses a cpap   Tracheostomy present (HCC) 01/05/2022   Size 6 shiley/cuffless  Last changed: 10/9     Discussion  Stable trach.  She does get a Blume more short of breath with exertion.  Although I did not observe it on my exam she does endorse she will occasionally have a rush of air after removing PMV following activity.  This could suggest some mild air trapping from the trach.  Otherwise she is doing fantastic with  the trach     Plan  Cont routin   Significant Hospital Events: Including procedures, antibiotic start and stop dates in addition to other pertinent events   4/10 - Presented to [facility] after fall. Hypoxic on RA with SpO2 60s per EMS, placed on NRB with improvement. Initial VBG poor with pH 7.219  Interim History / Subjective:  Currently on  Trach collar at 40%, Sats 94, but drop to 88% at times when asleep. Very shallow respirations.  Needs PT/ OT and OOB Currently on 40% ATC  Objective:  Blood pressure (!) 139/55, pulse 62, temperature 98.9 F (37.2 C), temperature source Oral, resp. rate 17, height 5\' 6"  (1.676 m), weight (!) 189.4 kg, last menstrual period 11/29/2015, SpO2 95%.    Vent Mode: PSV FiO2 (%):  [40 %] 40 % PEEP:  [5 cmH20] 5 cmH20 Pressure Support:  [8 cmH20-10 cmH20] 8 cmH20   Intake/Output Summary (Last 24 hours) at 10/26/2023 0929 Last data filed at 10/26/2023 0800 Gross per 24 hour  Intake 494.2 ml  Output 3100 ml  Net -2605.8 ml   Filed Weights   10/22/23 2044 10/25/23 0500  Weight: (!) 178 kg (!) 189.4 kg   Physical Examination: General:  Critically ill  female supine in bed on ATC HEENT: MM pink/moist; trach in place, midline and clean Neuro: Awake, answers to call of name, follows commands , MAE x 4, Alert to self and place CV: s1s2, RRR, no m/r/g PULM:  Bilateral chest excursion, clear but diminished per bases, very shallow respirations  GI: soft, bsx4 active , ND, NT, BS +, Body mass index is 67.39 kg/m.  Extremities: warm/dry, no edema   Labs reviewed WBC 5.1/ HGB 13.3/Platelets 174  Chemistry ordered 4/14   Resolved Hospital Problem List:    Assessment & Plan:  Acute-on-chronic hypoxic and hypercarbic respiratory failure Acute mechanical ventilation need in the setting of respiratory failure Tracheostomy status, 11/2021 OSA/OHS Presented to [facility] 4/10 post-fall at home, felt dizzy prior to fall from toilet. SpO2 60% on RA on EMS arrival. Placed on NRB with significant improvement. Hypercarbic and hypoxic on gas, Shiley #6 cuffless exchanged to #6 cuffed for MV. Placed on ventilator with significant improvement in hypercarbia and mentation. Plan: -weaned to ATC - Wean FiO2 to baseline of RA as able maintaining sats > 88% -PMV as able --ABG prn - OOB to Chair and work with PT as  able    Mechanical fall with multiple fractures (R clavicle, L tibial plateau, L1 vertebrae) Plan: -per trauma/ortho/nsg -pt/ot - Will add Tylenol for pain, nothing that will suppress her respirations    History of PE/DVT Plan: -heparin iv  Hx of chf, htn, hld Plan: -prn hydralazine -resume home anti-htn meds and statin when able  Best Practice (right click and "Reselect all SmartList Selections" daily)   Diet/type: NPO DVT prophylaxis: systemic heparin GI prophylaxis: PPI Central venous access:  N/A Foley:  N/A Code Status:  full code Last date of multidisciplinary goals of care discussion [Pending]  Labs:  CBC: Recent Labs  Lab 10/22/23 2108 10/23/23 0207 10/23/23 0408 10/23/23 0519 10/24/23 0711 10/25/23 0139 10/25/23 0448 10/25/23 0729 10/26/23 0446  WBC 9.8  --  8.5  --  6.0  --   --  5.5 5.1  HGB 13.4   < > 12.5   < > 13.0 15.6* 14.6 12.7 13.3  HCT 44.7   < > 42.2   < > 43.7 46.0 43.0 43.0 45.1  MCV 97.4  --  98.1  --  96.3  --   --  97.3 98.3  PLT 236  --  205  --  233  --   --  210 174   < > = values in this interval not displayed.   Basic Metabolic Panel: Recent Labs  Lab 10/22/23 2102 10/22/23 2108 10/23/23 0207 10/23/23 0408 10/23/23 0519 10/24/23 0711 10/25/23 0139 10/25/23 0448 10/25/23 0729  NA 138 138   < > 140 140 137 138 138 137  K 5.7* 4.5   < > 4.6 4.4 3.9 4.5 4.3 4.4  CL 101 97*  --  99  --  96*  --   --  97*  CO2  --  30  --  33*  --  31  --   --  28  GLUCOSE 101* 104*  --  101*  --  69*  --   --  75  BUN 26* 17  --  17  --  21*  --   --  18  CREATININE 1.10* 0.96  --  0.98  --  1.00  --   --  0.83  CALCIUM  --  9.1  --  8.7*  --  8.5*  --   --  8.8*  MG  --   --   --   --   --  2.2  --   --   --   PHOS  --   --   --   --   --  4.4  --   --   --    < > = values in this interval not displayed.   GFR: Estimated Creatinine Clearance: 129.8 mL/min (by C-G formula based on SCr of 0.83 mg/dL). Recent Labs  Lab 10/22/23 2102  10/22/23 2108 10/23/23 0408 10/23/23 1018 10/24/23 0711 10/25/23 0729 10/26/23 0446  PROCALCITON  --   --   --  <0.10  --   --   --   WBC  --    < > 8.5  --  6.0 5.5 5.1  LATICACIDVEN 1.6  --   --   --   --   --   --    < > = values in this interval not displayed.   Liver Function Tests: Recent Labs  Lab 10/22/23 2108 10/23/23 0408 10/25/23 0729  AST 22 23 19   ALT 11 11 12   ALKPHOS 58 52 47  BILITOT 1.7* 1.5* 1.4*  PROT 7.6 6.6 6.9  ALBUMIN 3.2* 2.9* 2.9*   No results for input(s): "LIPASE", "AMYLASE" in the last 168 hours. No results for input(s): "AMMONIA" in the last 168 hours.  ABG:    Component Value Date/Time   PHART 7.366 10/25/2023 0448   PCO2ART 61.2 (H) 10/25/2023 0448   PO2ART 60 (L) 10/25/2023 0448   HCO3 35.3 (H) 10/25/2023 0448   TCO2 37 (H) 10/25/2023 0448   ACIDBASEDEF 4.0 (H) 05/01/2021 0158   O2SAT 90 10/25/2023 0448    Coagulation Profile: Recent Labs  Lab 10/22/23 2108  INR 1.1   Cardiac Enzymes: Recent Labs  Lab 10/22/23 2108  CKTOTAL 385*   HbA1C: Hemoglobin A1C  Date/Time Value Ref Range Status  06/10/2016 04:41 PM 5.4  Final   Hgb A1c MFr Bld  Date/Time Value Ref Range Status  10/25/2023 07:29 AM 5.1 4.8 - 5.6 % Final    Comment:    (NOTE) Pre diabetes:          5.7%-6.4%  Diabetes:              >  6.4%  Glycemic control for   <7.0% adults with diabetes    CBG: Recent Labs  Lab 10/25/23 1540 10/25/23 1920 10/25/23 2304 10/26/23 0315 10/26/23 0804  GLUCAP 73 80 102* 95 90   Allergies: No Known Allergies   Home Medications: Prior to Admission medications   Medication Sig Start Date End Date Taking? Authorizing Provider  amLODipine (NORVASC) 5 MG tablet Take 1 tablet (5 mg total) by mouth daily. 06/17/23  Yes Fleming, Zelda W, NP  apixaban (ELIQUIS) 5 MG TABS tablet Take 1 tablet (5 mg total) by mouth 2 (two) times daily. 06/17/23  Yes Fleming, Zelda W, NP  atorvastatin (LIPITOR) 20 MG tablet Take 1 tablet (20 mg  total) by mouth daily. 06/17/23  Yes Fleming, Zelda W, NP  dapagliflozin propanediol (FARXIGA) 10 MG TABS tablet Take 1 tablet (10 mg total) by mouth daily. 06/17/23  Yes Fleming, Zelda W, NP  furosemide (LASIX) 20 MG tablet Take 1 tablet (20 mg total) by mouth daily. Take 40 mg or 2 tablets for the next 5 days then resume 20 mg or 1 tablet by mouth daily by mouth. 06/17/23  Yes [name], NP  PRESCRIPTION MEDICATION 1 each by Other route at bedtime. CPAP- At bedtime   Yes [provider]  spironolactone (ALDACTONE) 25 MG tablet Take 1 tablet (25 mg total) by mouth daily. 06/17/23  Yes [name], NP  Misc. Devices MISC Please supply patient with tracheostomy humidifier  Z93.0 03/25/22   [name], NP    Critical care time: 38 minutes  Raejean Bullock, MSN, AGACNP-BC [facility] Pulmonary/Critical Care Medicine See Amion for personal pager PCCM on call pager [phone]  10/26/2023, 9:29 AM  Please see [email or web address] for pager details.  From 7A-7P if no response, please call [phone]. After hours, please call ELink [phone].

## 2023-10-27 ENCOUNTER — Inpatient Hospital Stay: Admitting: Nurse Practitioner

## 2023-10-27 LAB — POCT I-STAT 7, (LYTES, BLD GAS, ICA,H+H)
Acid-Base Excess: 15 mmol/L — ABNORMAL HIGH (ref 0.0–2.0)
Bicarbonate: 43.7 mmol/L — ABNORMAL HIGH (ref 20.0–28.0)
Calcium, Ion: 1.28 mmol/L (ref 1.15–1.40)
HCT: 42 % (ref 36.0–46.0)
Hemoglobin: 14.3 g/dL (ref 12.0–15.0)
O2 Saturation: 95 %
Potassium: 4.5 mmol/L (ref 3.5–5.1)
Sodium: 133 mmol/L — ABNORMAL LOW (ref 135–145)
TCO2: 46 mmol/L — ABNORMAL HIGH (ref 22–32)
pCO2 arterial: 67.9 mmHg (ref 32–48)
pH, Arterial: 7.417 (ref 7.35–7.45)
pO2, Arterial: 79 mmHg — ABNORMAL LOW (ref 83–108)

## 2023-10-27 LAB — BASIC METABOLIC PANEL WITH GFR
Anion gap: 7 (ref 5–15)
BUN: 13 mg/dL (ref 6–20)
CO2: 37 mmol/L — ABNORMAL HIGH (ref 22–32)
Calcium: 9.4 mg/dL (ref 8.9–10.3)
Chloride: 92 mmol/L — ABNORMAL LOW (ref 98–111)
Creatinine, Ser: 0.77 mg/dL (ref 0.44–1.00)
GFR, Estimated: >60 mL/min (ref >60)
Glucose, Bld: 100 mg/dL — ABNORMAL HIGH (ref 70–99)
Potassium: 4.6 mmol/L (ref 3.5–5.1)
Sodium: 136 mmol/L (ref 135–145)

## 2023-10-27 LAB — CBC WITH DIFFERENTIAL/PLATELET
Abs Immature Granulocytes: 0.04 10*3/uL (ref 0.00–0.07)
Basophils Absolute: 0 10*3/uL (ref 0.0–0.1)
Basophils Relative: 1 %
Eosinophils Absolute: 0.1 10*3/uL (ref 0.0–0.5)
Eosinophils Relative: 2 %
HCT: 44.5 % (ref 36.0–46.0)
Hemoglobin: 13.4 g/dL (ref 12.0–15.0)
Immature Granulocytes: 1 %
Lymphocytes Relative: 18 %
Lymphs Abs: 0.8 10*3/uL (ref 0.7–4.0)
MCH: 29.2 pg (ref 26.0–34.0)
MCHC: 30.1 g/dL (ref 30.0–36.0)
MCV: 96.9 fL (ref 80.0–100.0)
Monocytes Absolute: 0.5 10*3/uL (ref 0.1–1.0)
Monocytes Relative: 11 %
Neutro Abs: 2.9 10*3/uL (ref 1.7–7.7)
Neutrophils Relative %: 67 %
Platelets: 177 10*3/uL (ref 150–400)
RBC: 4.59 MIL/uL (ref 3.87–5.11)
RDW: 15.1 % (ref 11.5–15.5)
WBC: 4.3 10*3/uL (ref 4.0–10.5)
nRBC: 0 % (ref 0.0–0.2)

## 2023-10-27 LAB — GLUCOSE, CAPILLARY
Glucose-Capillary: 105 mg/dL — ABNORMAL HIGH (ref 70–99)
Glucose-Capillary: 103 mg/dL — ABNORMAL HIGH (ref 70–99)
Glucose-Capillary: 108 mg/dL — ABNORMAL HIGH (ref 70–99)
Glucose-Capillary: 91 mg/dL (ref 70–99)
Glucose-Capillary: 130 mg/dL — ABNORMAL HIGH (ref 70–99)
Glucose-Capillary: 95 mg/dL (ref 70–99)

## 2023-10-27 LAB — BRAIN NATRIURETIC PEPTIDE: B Natriuretic Peptide: 9.4 pg/mL (ref 0.0–100.0)

## 2023-10-27 LAB — MAGNESIUM: Magnesium: 2 mg/dL (ref 1.7–2.4)

## 2023-10-27 LAB — APTT: aPTT: 95 s — ABNORMAL HIGH (ref 24–36)

## 2023-10-27 LAB — HEPARIN LEVEL (UNFRACTIONATED): Heparin Unfractionated: 0.76 [IU]/mL — ABNORMAL HIGH (ref 0.30–0.70)

## 2023-10-27 MED ORDER — AMLODIPINE BESYLATE 5 MG PO TABS
5.0000 mg | ORAL_TABLET | Freq: Every day | ORAL | Status: DC
  Administered 2023-10-27 – 2023-11-26 (×31): 5 mg via ORAL
  Filled 2023-10-27 (×31): qty 1

## 2023-10-27 MED ORDER — FUROSEMIDE 20 MG PO TABS
20.0000 mg | ORAL_TABLET | Freq: Every day | ORAL | Status: DC
  Administered 2023-10-27 – 2023-11-08 (×13): 20 mg via ORAL
  Filled 2023-10-27 (×13): qty 1

## 2023-10-27 MED ORDER — APIXABAN 5 MG PO TABS
5.0000 mg | ORAL_TABLET | Freq: Two times a day (BID) | ORAL | Status: DC
Start: 2023-10-27 — End: 2023-11-26
  Administered 2023-10-27 – 2023-11-26 (×61): 5 mg via ORAL
  Filled 2023-10-27 (×61): qty 1

## 2023-10-27 NOTE — Evaluation (Signed)
 Occupational Therapy Evaluation Patient Details Name: Angel Brewer MRN: [id number] DOB: July 18, 1965 Today's Date: 10/27/2023   History of Present Illness   Pt is a 58 y.o. female presenting from home after a fall 4/10. Hypoxic and placed on NRB. Found to have acute superior endplate deformity of L1, R clavicular head fx, L displaced tibial plateau intercondylar fx. CTH with no acute findings, fx of L proximal humerus.  PMH significant for chronic hypoxic hypercarbic respiratory failure tracheostomy dependent since May 2023, HFpEF, OSA, PE and DVT on Eliquis, and morbid obesity (BMI 67)     Clinical Impressions Pt presents with problem above with deficits mentioned below. 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 Upon eval, pt significantly limited by RUE pain, generalized weakness, and decreased seated balance. Pt on 10L via TC at 40% but reports she used to wear 3L at night but recently has been wearing more at home due to dyspnea at night. Today, pt required +2 max A for bed mobility to EOB and rolling and needed constant support for sitting balance. Pt motivated to progress back to PLOF and reports niece likely able to stay with her after dc. Recommending inpatient rehab >3 hours/day to optimize safety and independence with ADL and IADL prior to return home.      If plan is discharge home, recommend the following:   Two people to help with walking and/or transfers;Two people to help with bathing/dressing/bathroom;Assistance with feeding;Assistance with cooking/housework;Assist for transportation;Help with stairs or ramp for entrance     Functional Status Assessment   Patient has had a recent decline in their functional status and demonstrates the ability to make significant improvements in function in a reasonable and predictable amount of time.     Equipment Recommendations   Other (comment) (defer to next level of care)      Recommendations for Other Services   Rehab consult     Precautions/Restrictions   Precautions Precautions: Fall Recall of Precautions/Restrictions: Intact Required Braces or Orthoses: Sling Restrictions Weight Bearing Restrictions Per Provider Order: Yes RUE Weight Bearing Per Provider Order: Non weight bearing LUE Weight Bearing Per Provider Order: Weight bearing as tolerated LLE Weight Bearing Per Provider Order: Weight bearing as tolerated Other Position/Activity Restrictions: Per ortho on 4/11: NWB but if she can tolerated using the RUE for transfers she is allowed     Mobility Bed Mobility Overal bed mobility: Needs Assistance Bed Mobility: Rolling, Sidelying to Sit, Sit to Sidelying Rolling: Max assist, +2 for physical assistance, +2 for safety/equipment, Used rails Sidelying to sit: Max assist, +2 for physical assistance, +2 for safety/equipment, HOB elevated     Sit to sidelying: Max assist, +2 for physical assistance, +2 for safety/equipment, HOB elevated General bed mobility comments: +2-3 to complete bed rolling. maxA to manage LE movements, assist at trunk and maintain sitting balance initially. significant pain with rolling onto R shoulder    Transfers                   General transfer comment: deferred due to desat and fatigue at EOB      Balance Overall balance assessment: Needs assistance Sitting-balance support: Single extremity supported, Feet supported Sitting balance-[name]: Zero Sitting balance - Comments: dependent on posterior support. assist at knees to prevent hips sliding forwards, tolerated for ~10 min with SpO2 to low of 88% on 10L trach collar Postural control: Posterior lean  ADL either performed or assessed with clinical judgement   ADL Overall ADL's : Needs assistance/impaired Eating/Feeding: Set up;Bed level   Grooming: Set up;Bed level   Upper Body Bathing: Maximal  assistance;Sitting   Lower Body Bathing: Total assistance;Bed level;+2 for safety/equipment;+2 for physical assistance   Upper Body Dressing : Maximal assistance;Sitting   Lower Body Dressing: Total assistance;+2 for physical assistance;+2 for safety/equipment;Bed level     Toilet Transfer Details (indicate cue type and reason): deferred                 Vision Patient Visual Report: No change from baseline       Perception         Praxis         Pertinent Vitals/Pain Pain Assessment Pain Assessment: 0-10 Pain Score: 8  Pain Location: R arm Pain Descriptors / Indicators: Aching, Discomfort, Grimacing, Moaning Pain Intervention(s): Limited activity within patient's tolerance, Monitored during session     Extremity/Trunk Assessment Upper Extremity Assessment Upper Extremity Assessment: Right hand dominant;RUE deficits/detail RUE Deficits / Details: RUE with functional ROM elbow/wrist/hand. shoulder ROM not formally assessed. Educated regarding NWB precautions and positioning at rest. Pt with sling not donned on arrival; eductaed regarding positioning but pt deferred donning   Lower Extremity Assessment Lower Extremity Assessment: Defer to PT evaluation RLE Deficits / Details: limited due to body habitus, no change in sensation. functional strength against gravity intact RLE Sensation: WNL RLE Coordination: WNL LLE Deficits / Details: limited due to body habitus, no change in sensation. limited ROM at knee due to pain but functional quad set in bed LLE Sensation: WNL LLE Coordination: WNL   Cervical / Trunk Assessment Cervical / Trunk Assessment: Other exceptions Cervical / Trunk Exceptions: significant body habitus   Communication Communication Communication: No apparent difficulties Factors Affecting Communication: [name] valve   Cognition Arousal: Alert Behavior During Therapy: WFL for tasks assessed/performed Cognition: No apparent impairments                                Following commands: Intact       Cueing  General Comments   Cueing Techniques: Verbal cues  SpO2 as low as 88% on TC at 10 L at 40%   Exercises Exercises: General Lower Extremity General Exercises - Lower Extremity Ankle Circles/Pumps: Both, 10 reps, AROM Heel Slides: AAROM, Both, 5 reps Hip ABduction/ADduction: AAROM, Both, 5 reps   Shoulder Instructions      Home Living Family/patient expects to be discharged to:: Private residence Living Arrangements: Alone Available Help at Discharge: Family;Available PRN/intermittently Type of Home: House Home Access: Stairs to enter [employer] of Steps: 1 Entrance Stairs-Rails: None Home Layout: One level     Bathroom Shower/Tub: Producer, television/film/video: Handicapped height Bathroom Accessibility: No   Home Equipment: Agricultural consultant (2 wheels);BSC/3in1   Additional Comments: walker does not fit in bathroom, shower seat doesnt fit in shower      Prior Functioning/Environment Prior Level of Function : Independent/Modified Independent;Driving;Working/employed             Mobility Comments: no use of DME, no other falls ADLs Comments: driving, works at [employer] through, independent    OT Problem List: Decreased strength;Decreased activity tolerance;Impaired balance (sitting and/or standing);Decreased knowledge of use of DME or AE;Decreased knowledge of precautions;Impaired UE functional use   OT Treatment/Interventions: Self-care/ADL training;Therapeutic exercise;DME and/or AE instruction;Therapeutic activities;Balance training;Patient/family education  OT Goals(Current goals can be found in the care plan section)   Acute Rehab OT Goals Patient Stated Goal: get better OT Goal Formulation: With patient Time For Goal Achievement: 11/10/23 Potential to Achieve Goals: Good   OT Frequency:  Min 2X/week    Co-evaluation PT/OT/SLP  Co-Evaluation/Treatment: Yes Reason for Co-Treatment: For patient/therapist safety   OT goals addressed during session: Proper use of Adaptive equipment and DME;ADL's and self-care      AM-PAC OT "6 Clicks" Daily Activity     Outcome Measure Help from another person eating meals?: None Help from another person taking care of personal grooming?: A Brugger Help from another person toileting, which includes using toliet, bedpan, or urinal?: Total Help from another person bathing (including washing, rinsing, drying)?: A Lot Help from another person to put on and taking off regular upper body clothing?: A Lot Help from another person to put on and taking off regular lower body clothing?: Total 6 Click Score: 13   End of Session Equipment Utilized During Treatment: Oxygen Nurse Communication: Mobility status  Activity Tolerance: Patient tolerated treatment well Patient left: in bed;with call bell/phone within reach  OT Visit Diagnosis: Unsteadiness on feet (R26.81);Muscle weakness (generalized) (M62.81);History of falling (Z91.81);Pain Pain - Right/Left: Right Pain - part of body: Shoulder                Time: 1610-9604 OT Time Calculation (min): 43 min Charges:  OT General Charges $OT Visit: 1 Visit OT Evaluation $OT Eval Moderate Complexity: 1 Mod  Karilyn Ouch, OTR/L [facility] Acute Rehabilitation Office: [phone]   Emery Hans 10/27/2023, 4:05 PM

## 2023-10-27 NOTE — Progress Notes (Signed)
 NAME:  Angel Brewer, MRN:  [id number], DOB:  1966/04/23, LOS: 4 ADMISSION DATE:  10/22/2023 CONSULTATION DATE:  10/23/2023 REFERRING MD:  Janalyn Shy - TR[facility] CHIEF COMPLAINT: Fall, acute-on-chronic respiratory failure   History of Present Illness:  58 year old woman who presented to [facility] ED 4/10 for fall and AoC RF. PMHx significant for HTN, HLD, chronic diastolic CHF (Echo 11/2021 with EF 60-65%, mild LVH), OHS/OSA s/p tracheostomy 11/2021 (on CPAP/nocturnal O2, followed by Anders Simmonds, NP) with chronic hypoxic and hypercarbic respiratory failure, DVT/PE (on Eliquis). Recent hospitalization 08/2023 for AoC RF, due to follow up in trach clinic around current time (~6 weeks from discharge date).   History is obtained primarily from chart review. Patient reportedly had a fall from the toilet around 1000 4/10AM and was unable to get up from the bathroom floor; experienced L leg pain and R shoulder pain. EMS was called and on arrival she was noted to be hypoxic on RA (SpO2 60s) and was placed on NRB 15L with SpO2 improved to 98%. Denied LOC/head injury.  On ED arrival, patient was afebrile, HR 96, BP 165/118, RR 25, SpO2 100% on NRB. CXR showed no active disease. CT Head/C-spine with NAICA, no acute displaced fracture/traumatic listhesis of C-spine. CT Chest/A/P showed new endplate deformity of L1, nondisplaced fractures of the R clavicular head, nondisplaced fractures of the proximal L humerus, tracheostomy. XR of L Femur demonstrated acute displaced tibial plateau intercondylar fracture and tricompartmental degenerative changes of the L knee.  Labs were notable for CBC WNL, INR 1.1, Na 138, K 4.5, CO2 30, Cr 0.96, AST/ALT/Alk Phos WNL, Tbili 1.7. CK 385, LA 1.6. Ethanol < 10. Initial VBG with pH 7.215/pCO2 93.7, bicarb 37.8. Patient was deemed to require mechanical ventilation and tracheostomy was exchanged by RT from #6 Shiley cuffless to #6 Shiley cuffed tracheostomy. Patient was subsequently placed on MV.  Repeat ABG improved to 7.349/pCO2 64.8/pO2 63/bicarb 35.7.  PCCM consulted for evaluation and management.  Reba states she felt dizzy prior to her fall, then was too weak too stand up (later found to have fractures as well). She feels like she has been sleeping much more lately and that her energy really never recovered from her hospitalization in February. She otherwise felt she had been doing well, no issues with her oxygenation or tracheostomy. Denies fever/chills, CP/SOB, n/v/d, recent sick contacts.  On 4/12 overnight patient on floors trach collar. Was initially Aox4. Throughout the night became unresponsive. Given narcan with some improvement in mentation but still confused. ABG showing co2 >100. Transferred to ICU and placed on vent.   Pertinent Medical History:   Past Medical History:  Diagnosis Date   Chronic diastolic CHF (congestive heart failure) (HCC) 01/05/2022   DVT (deep vein thrombosis) in pregnancy    RLE DVT 02/2016   Dyspnea    Essential hypertension 08/25/2017   Hyperlipidemia 11/21/2021   Morbid obesity (HCC)    Obesity hypoventilation syndrome (HCC) 05/03/2021   PE (pulmonary embolism) 02/29/2016   Sleep apnea    uses a cpap   Tracheostomy present (HCC) 01/05/2022   Size 6 shiley/cuffless  Last changed: 10/9     Discussion  Stable trach.  She does get a Peyser more short of breath with exertion.  Although I did not observe it on my exam she does endorse she will occasionally have a rush of air after removing PMV following activity.  This could suggest some mild air trapping from the trach.  Otherwise she is doing fantastic with  the trach     Plan  Cont routin   Significant Hospital Events: Including procedures, antibiotic start and stop dates in addition to other pertinent events   4/10 - Presented to [facility] after fall. Hypoxic on RA with SpO2 60s per EMS, placed on NRB with improvement. Initial VBG poor with pH 7.219 4/11 has been off the vent last two  nights  Interim History / Subjective:  Stable on trach collar, abg with compensated hypercarbia  Objective:  Blood pressure (!) 131/59, pulse 62, temperature 98.5 F (36.9 C), temperature source Axillary, resp. rate 18, height 5\' 6"  (1.676 m), weight (!) 189.4 kg, last menstrual period 11/29/2015, SpO2 97%.    FiO2 (%):  [40 %] 40 %   Intake/Output Summary (Last 24 hours) at 10/27/2023 1610 Last data filed at 10/27/2023 0600 Gross per 24 hour  Intake 672.06 ml  Output 900 ml  Net -227.94 ml   Filed Weights   10/22/23 2044 10/25/23 0500  Weight: (!) 178 kg (!) 189.4 kg   Physical Examination: General:  chronically ill-appearing F resting in bed in NAD HEENT: MM pink/moist; trach in place, midline with minimal frothy secretions  Neuro: Awake, answers to call of name, follows commands , MAE x 4, Alert to self and place CV: s1s2, RRR, no m/r/g PULM:  Bilateral chest excursion, clear but diminished per bases, very shallow respirations  GI: soft, bsx4 active , ND, NT, BS +, Body mass index is 67.39 kg/m.  Extremities: warm/dry, no edema   ABG: 7.4/67.9/79/43   Resolved Hospital Problem List:    Assessment & Plan:  Acute-on-chronic hypoxic and hypercarbic respiratory failure Acute mechanical ventilation need in the setting of respiratory failure Tracheostomy status, 11/2021 OSA/OHS Presented to [facility] 4/10 post-fall at home, felt dizzy prior to fall from toilet. SpO2 60% on RA on EMS arrival. Placed on NRB with significant improvement. Hypercarbic and hypoxic on gas, Shiley #6 cuffless exchanged to #6 cuffed for MV. Placed on ventilator with significant improvement in hypercarbia and mentation. Plan: -plan to discharge on Trilogy at bedtime, explore dispo options  -weaned to ATC -PMV as able --ABG prn - follow  PT/OT recs    Mechanical fall with R clavicle fracture Seen by  trauma/ortho/nsg -sling for comfort -WBAT -pt/ot - cont prn tylenol   History of  PE/DVT -transition heparin back to home Eliquis   Hx of chf, htn, hld -prn hydralazine -resume home Norvasc 5mg   Best Practice (right click and "Reselect all SmartList Selections" daily)   Diet/type: dysphagia diet (see orders) DVT prophylaxis: DOAC GI prophylaxis: PPI Central venous access:  N/A Foley:  N/A Code Status:  full code Last date of multidisciplinary goals of care discussion [pending, plan discussed with patient ]  Labs:  CBC: Recent Labs  Lab 10/23/23 0408 10/23/23 0519 10/24/23 0711 10/25/23 0139 10/25/23 0448 10/25/23 0729 10/26/23 0446 10/27/23 0512  WBC 8.5  --  6.0  --   --  5.5 5.1 4.3  NEUTROABS  --   --   --   --   --   --   --  2.9  HGB 12.5   < > 13.0 15.6* 14.6 12.7 13.3 13.4  HCT 42.2   < > 43.7 46.0 43.0 43.0 45.1 44.5  MCV 98.1  --  96.3  --   --  97.3 98.3 96.9  PLT 205  --  233  --   --  210 174 177   < > = values in this interval  not displayed.   Basic Metabolic Panel: Recent Labs  Lab 10/23/23 0408 10/23/23 0519 10/24/23 0711 10/25/23 0139 10/25/23 0448 10/25/23 0729 10/26/23 1311 10/27/23 0512  NA 140   < > 137 138 138 137 135 136  K 4.6   < > 3.9 4.5 4.3 4.4 4.5 4.6  CL 99  --  96*  --   --  97* 92* 92*  CO2 33*  --  31  --   --  28 37* 37*  GLUCOSE 101*  --  69*  --   --  75 95 100*  BUN 17  --  21*  --   --  18 15 13   CREATININE 0.98  --  1.00  --   --  0.83 0.77 0.77  CALCIUM 8.7*  --  8.5*  --   --  8.8* 9.2 9.4  MG  --   --  2.2  --   --   --   --  2.0  PHOS  --   --  4.4  --   --   --   --   --    < > = values in this interval not displayed.   GFR: Estimated Creatinine Clearance: 134.7 mL/min (by C-G formula based on SCr of 0.77 mg/dL). Recent Labs  Lab 10/22/23 2102 10/22/23 2108 10/23/23 1018 10/24/23 0711 10/25/23 0729 10/26/23 0446 10/27/23 0512  PROCALCITON  --   --  <0.10  --   --   --   --   WBC  --    < >  --  6.0 5.5 5.1 4.3  LATICACIDVEN 1.6  --   --   --   --   --   --    < > = values in this  interval not displayed.   Liver Function Tests: Recent Labs  Lab 10/22/23 2108 10/23/23 0408 10/25/23 0729  AST 22 23 19   ALT 11 11 12   ALKPHOS 58 52 47  BILITOT 1.7* 1.5* 1.4*  PROT 7.6 6.6 6.9  ALBUMIN 3.2* 2.9* 2.9*   No results for input(s): "LIPASE", "AMYLASE" in the last 168 hours. No results for input(s): "AMMONIA" in the last 168 hours.  ABG:    Component Value Date/Time   PHART 7.366 10/25/2023 0448   PCO2ART 61.2 (H) 10/25/2023 0448   PO2ART 60 (L) 10/25/2023 0448   HCO3 35.3 (H) 10/25/2023 0448   TCO2 37 (H) 10/25/2023 0448   ACIDBASEDEF 4.0 (H) 05/01/2021 0158   O2SAT 90 10/25/2023 0448    Coagulation Profile: Recent Labs  Lab 10/22/23 2108  INR 1.1   Cardiac Enzymes: Recent Labs  Lab 10/22/23 2108  CKTOTAL 385*   HbA1C: Hemoglobin A1C  Date/Time Value Ref Range Status  06/10/2016 04:41 PM 5.4  Final   Hgb A1c MFr Bld  Date/Time Value Ref Range Status  10/25/2023 07:29 AM 5.1 4.8 - 5.6 % Final    Comment:    (NOTE) Pre diabetes:          5.7%-6.4%  Diabetes:              >6.4%  Glycemic control for   <7.0% adults with diabetes    CBG: Recent Labs  Lab 10/26/23 1531 10/26/23 1929 10/26/23 2322 10/27/23 0318 10/27/23 0738  GLUCAP 107* 96 112* 105* 103*   Allergies: No Known Allergies   Home Medications: Prior to Admission medications   Medication Sig Start Date End Date Taking? Authorizing Provider  amLODipine (NORVASC)  5 MG tablet Take 1 tablet (5 mg total) by mouth daily. 06/17/23  Yes Fleming, Zelda W, NP  apixaban (ELIQUIS) 5 MG TABS tablet Take 1 tablet (5 mg total) by mouth 2 (two) times daily. 06/17/23  Yes Fleming, Zelda W, NP  atorvastatin (LIPITOR) 20 MG tablet Take 1 tablet (20 mg total) by mouth daily. 06/17/23  Yes Fleming, Zelda W, NP  dapagliflozin propanediol (FARXIGA) 10 MG TABS tablet Take 1 tablet (10 mg total) by mouth daily. 06/17/23  Yes Fleming, Zelda W, NP  furosemide (LASIX) 20 MG tablet Take 1 tablet (20 mg  total) by mouth daily. Take 40 mg or 2 tablets for the next 5 days then resume 20 mg or 1 tablet by mouth daily by mouth. 06/17/23  Yes [name], NP  PRESCRIPTION MEDICATION 1 each by Other route at bedtime. CPAP- At bedtime   Yes [provider]  spironolactone (ALDACTONE) 25 MG tablet Take 1 tablet (25 mg total) by mouth daily. 06/17/23  Yes [name], NP  Misc. Devices MISC Please supply patient with tracheostomy humidifier  Z93.0 03/25/22   [name], NP    Critical care time:      Patt Boozer Kymberlie Brazeau, PA-C [facility] See Amion for pager If no response to pager , please call [phone] until 7pm After 7:00 pm call Elink  [phone]

## 2023-10-27 NOTE — Progress Notes (Signed)
 Speech Language Pathology Treatment: Dysphagia;Passy Muir Speaking valve  Patient Details Name: Angel Brewer MRN: [id number] DOB: 1965/07/17 Today's Date: 10/27/2023 Time: 7829-5621 SLP Time Calculation (min) (ACUTE ONLY): 20 min  Assessment / Plan / Recommendation Clinical Impression  SLP conducted skilled therapy session targeting PMSV tolerance and swallowing goals. Upon SLP entry, patient with RT, cuff deflated/PMSV donned and eating Dys2/thin liquids breakfast tray. Patient endorses donning valve upon staff arrival prior to starting breakfast. At patient's estimate, valve donned between 15 and 30 minutes continuously prior to SLP arrival. Noted no desat during session, with SpO2 remaining between 88 and 94 throughout, HR 70-80, and RR 19-23. During session both during and in absence of PO intake, patient exhibited both dry and wet coughing episodes, at times expectorating secretions to the oral cavity and suctioning secretions with setup assist. SLP doffed valve briefly x2 during session during patient inhalations, noting air trapping during both trials. Given instances of air trapping, recommend continued full supervision from staff during all PMSV use. Targeted independent donning/doffing of valve this date with patient placing and removing valve x2 without assistance from SLP. Patient endorses that her difficulty with donning/doffing is 2/2 trach collar and requested we remove it, however was receptive of education re: regarding current reception of 10 L O2 and subsequent importance of leaving trach collar in place.   During assessment of diet tolerance, patient tolerated Dys2/thin liquid textures without suspected difficulty, though did demonstrate above mentioned cough during PO intake as well as in absence. Given patient's habitus, difficult to obtain fully upright position in air bed. Patient's dentures continue to be at home; SLP observed mild prolongation of mastication, however  patient may soon be appropriate for upgrade to Dys3 solids. Recommend continuation of Dys2/thin liquids at this time, however suspect patient will be appropriate for diet upgrade at next session date. Continue to administer medications whole with puree. Full supervision for meals given recommendation for full supervision with PMSV and directive to place valve for all POs. SLP will continue to follow.   HPI HPI: Patient is a 58 year old F who presented to the ED after a fall in her home resulting in L1 fx. PMH CHF, obesity hypoventilation syndrome s/p trach, OSA, h/o PE on Eliquis. Placed on trach collar (4/11), though returned to vent after episode of unresposiveness and was administered narcan. Returned to ICU from floor. On trach collar again today 4/14      SLP Plan  Continue with current plan of care      Recommendations for follow up therapy are one component of a multi-disciplinary discharge planning process, led by the attending physician.  Recommendations may be updated based on patient status, additional functional criteria and insurance authorization.    Recommendations  Diet recommendations: Dysphagia 2 (fine chop);Thin liquid Liquids provided via: Cup;Straw Medication Administration: Whole meds with puree Supervision: Full supervision/cueing for compensatory strategies (supervision due to recommendation to don PMSV during meals) Compensations: Minimize environmental distractions;Slow rate;Small sips/bites Postural Changes and/or Swallow Maneuvers: Seated upright 90 degrees      Patient may use Passy-Muir Speech Valve: During all therapies with supervision;During PO intake/meals PMSV Supervision: Full MD: Please consider changing trach tube to : Cuffless           Oral care BID   Intermittent Supervision/Assistance Aphonia (R49.1);Dysphagia, unspecified (R13.10)     Continue with current plan of care   Jeannie Done, M.A., CCC-SLP   Yetta Barre  10/27/2023, 10:27  AM

## 2023-10-27 NOTE — Progress Notes (Signed)
 PHARMACY - ANTICOAGULATION CONSULT NOTE  Pharmacy Consult for heparin Indication:  h/o VTE  Labs: Recent Labs    10/24/23 0711 10/24/23 1430 10/25/23 0139 10/25/23 0729 10/25/23 1305 10/26/23 0446 10/26/23 0723 10/26/23 1043 10/26/23 1311 10/26/23 1952 10/27/23 0512  HGB 13.0  --    < > 12.7  --  13.3  --   --   --   --  13.4  HCT 43.7  --    < > 43.0  --  45.1  --   --   --   --  44.5  PLT 233  --   --  210  --  174  --   --   --   --  177  APTT 38* 63*  --   --    < >  --    < > 99*  --  104* 95*  HEPARINUNFRC 0.43 0.58  --   --   --   --   --   --   --   --  0.76*  CREATININE 1.00  --   --  0.83  --   --   --   --  0.77  --  0.77   < > = values in this interval not displayed.   Assessment: 58yo female remains supratherapeutic on heparin after rate change; no infusion issues or signs of bleeding per RN.  Goal of Therapy:  aPTT 66-85 seconds   Plan:  Decrease heparin infusion by 2 units/kgABW/hr to 1600 units/hr. Check PTT in 6 hours.   Lonnie Roberts, PharmD, BCPS 10/27/2023 6:14 AM

## 2023-10-27 NOTE — Progress Notes (Signed)
 PHARMACY - ANTICOAGULATION CONSULT NOTE  Pharmacy Consult for heparin >> Eliquis Indication:  h/o VTE  Labs: Recent Labs    10/24/23 1430 10/25/23 0139 10/25/23 0729 10/25/23 1305 10/26/23 0446 10/26/23 0723 10/26/23 1043 10/26/23 1311 10/26/23 1952 10/27/23 0512  HGB  --    < > 12.7  --  13.3  --   --   --   --  13.4  HCT  --    < > 43.0  --  45.1  --   --   --   --  44.5  PLT  --   --  210  --  174  --   --   --   --  177  APTT 63*  --   --    < >  --    < > 99*  --  104* 95*  HEPARINUNFRC 0.58  --   --   --   --   --   --   --   --  0.76*  CREATININE  --   --  0.83  --   --   --   --  0.77  --  0.77   < > = values in this interval not displayed.   Assessment: 58yo female remains supratherapeutic on heparin after rate change; no infusion issues or signs of bleeding per RN.  Goal of Therapy:  Monitor platelets and CBC   Plan:  Start Eliquis 5 po BID.  Stop IV Heparin at the same time you give the first dose of Eliquis.  Discontinue aPTTs and heparin consult.   Lenard Quam, PharmD, BCPS, BCCCP Please refer to [facility] for [facility] Pharmacy numbers 10/27/2023 10:52 AM

## 2023-10-27 NOTE — Evaluation (Signed)
 Physical Therapy Evaluation Patient Details Name: Angel Brewer MRN: [id number] DOB: 16-Jul-1965 Today's Date: 10/27/2023  History of Present Illness  Pt is a 58 y.o. female presenting from home after a fall 4/10. Hypoxic and placed on NRB. Found to have acute superior endplate deformity of L1, R clavicular head fx, L displaced tibial plateau intercondylar fx. CTH with no acute findings, fx of L proximal humerus.  PMH significant for chronic hypoxic hypercarbic respiratory failure tracheostomy dependent since May 2023, HFpEF, OSA, PE and DVT on Eliquis, and morbid obesity (BMI 67).    Clinical Impression  Pt in bed upon arrival of PT, agreeable to evaluation at this time. Prior to admission the pt was independent without need for DME, working part time, and living alone. The pt presents today with limitations in functional mobility, strength, power, seated balance, ROM in LLE and RUE, and activity tolerance due to above dx, and will continue to benefit from skilled PT to address these deficits. The pt required significant assist to complete rolling due to pain in RUE, and assist to manage movement of BLE. The pt tolerated sitting with posterior back support, but did have drop to SpO2 of 88% on 10L trach collar. The pt is motivated to progress functional strength and independence with transfers. Reports she would have good support from niece after d/c as needed.         If plan is discharge home, recommend the following: Two people to help with walking and/or transfers;Two people to help with bathing/dressing/bathroom;Assistance with cooking/housework;Assist for transportation;Help with stairs or ramp for entrance   Can travel by private vehicle        Equipment Recommendations  (defer until gait training completed)  Recommendations for Other Services  Rehab consult    Functional Status Assessment Patient has had a recent decline in their functional status and demonstrates the ability  to make significant improvements in function in a reasonable and predictable amount of time.     Precautions / Restrictions Precautions Precautions: Fall Recall of Precautions/Restrictions: Intact Required Braces or Orthoses: Sling Restrictions Weight Bearing Restrictions Per Provider Order: Yes RUE Weight Bearing Per Provider Order: Non weight bearing LUE Weight Bearing Per Provider Order: Weight bearing as tolerated LLE Weight Bearing Per Provider Order: Weight bearing as tolerated Other Position/Activity Restrictions: Per ortho on 4/11: NWB but if she can tolerated using the RUE for transfers she is allowed      Mobility  Bed Mobility Overal bed mobility: Needs Assistance Bed Mobility: Rolling, Sidelying to Sit, Sit to Sidelying Rolling: Max assist, +2 for physical assistance, +2 for safety/equipment, Used rails Sidelying to sit: Max assist, +2 for physical assistance, +2 for safety/equipment, HOB elevated     Sit to sidelying: Max assist, +2 for physical assistance, +2 for safety/equipment, HOB elevated General bed mobility comments: +2-3 to complete bed rolling. maxA to manage LE movements, assist at trunk and maintain sitting balance initially. significant pain with rolling onto R shoulder    Transfers                   General transfer comment: deferred due to desat and fatigue at EOB       Balance Overall balance assessment: Needs assistance Sitting-balance support: Single extremity supported, Feet supported Sitting balance-[name]: Zero Sitting balance - Comments: dependent on posterior support. assist at knees to prevent hips sliding forwards, tolerated for ~10 min with SpO2 to low of 88% on 10L trach collar Postural control: Posterior lean  Pertinent Vitals/Pain Pain Assessment Pain Assessment: 0-10 Pain Score: 8  Pain Location: R arm Pain Descriptors / Indicators: Aching, Discomfort, Grimacing,  Moaning Pain Intervention(s): Limited activity within patient's tolerance, Monitored during session, Repositioned    Home Living Family/patient expects to be discharged to:: Private residence Living Arrangements: Alone Available Help at Discharge: Family;Available PRN/intermittently Type of Home: House Home Access: Stairs to enter Entrance Stairs-Rails: None Entrance Stairs-Number of Steps: 1   Home Layout: One level Home Equipment: Agricultural consultant (2 wheels);BSC/3in1 Additional Comments: walker does not fit in bathroom, shower seat doesnt fit in shower    Prior Function Prior Level of Function : Independent/Modified Independent;Driving;Working/employed             Mobility Comments: no use of DME, no other falls ADLs Comments: driving, 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 functional strength against gravity intact RLE Sensation: WNL RLE Coordination: WNL LLE Deficits / Details: limited due to body habitus, no change in sensation. limited ROM at knee due to pain but functional quad set in bed LLE Sensation: WNL LLE Coordination: WNL    Cervical / Trunk Assessment Cervical / Trunk Assessment: Other exceptions Cervical / Trunk Exceptions: significant body habitus  Communication   Communication Communication: No apparent difficulties Factors Affecting Communication: [name] valve    Cognition Arousal: Alert Behavior During Therapy: WFL for tasks assessed/performed   PT - Cognitive impairments: No apparent impairments                         Following commands: Intact       Cueing Cueing Techniques: Verbal cues     General Comments General comments  (skin integrity, edema, etc.): SpO2 to low of 88% on 10L trach collar    Exercises General Exercises - Lower Extremity Ankle Circles/Pumps: Both, 10 reps, AROM Heel Slides: AAROM, Both, 5 reps Hip ABduction/ADduction: AAROM, Both, 5 reps   Assessment/Plan    PT Assessment Patient needs continued PT services  PT Problem List Decreased strength;Decreased range of motion;Decreased activity tolerance;Decreased balance;Decreased mobility;Obesity;Pain       PT Treatment Interventions DME instruction;Stair training;Gait training;Functional mobility training;Therapeutic activities;Therapeutic exercise;Balance training    PT Goals (Current goals can be found in the Care Plan section)  Acute Rehab PT Goals Patient Stated Goal: return to independence PT Goal Formulation: With patient Time For Goal Achievement: 11/10/23 Potential to Achieve Goals: Good    Frequency Min 2X/week        AM-PAC PT "6 Clicks" Mobility  Outcome Measure Help needed turning from your back to your side while in a flat bed without using bedrails?: A Lot Help needed moving from lying on your back to sitting on the side of a flat bed without using bedrails?: Total Help needed moving to and from a bed to a chair (including a wheelchair)?: Total Help needed standing up from a chair using your arms (e.g., wheelchair or bedside chair)?: Total Help needed to walk in hospital room?: Total Help needed climbing 3-5 steps with a railing? : Total 6 Click Score: 7    End of Session Equipment Utilized During Treatment: Oxygen Activity Tolerance: Patient tolerated treatment well;Patient limited by fatigue Patient left: in bed;with call bell/phone within reach  Nurse Communication: Mobility status;Need for lift equipment PT Visit Diagnosis: Unsteadiness on feet (R26.81);Other abnormalities of gait and mobility (R26.89);Repeated falls (R29.6);Muscle weakness (generalized) (M62.81);Pain Pain - Right/Left: Right Pain - part of  body: Shoulder    Time: 4401-0272 PT Time Calculation (min) (ACUTE ONLY): 43 min   Charges:   PT Evaluation $PT Eval Moderate Complexity: 1 Mod PT Treatments $Therapeutic Activity: 8-22 mins PT General Charges $$ ACUTE PT VISIT: 1 Visit         Barnabas Booth, PT, DPT   Acute Rehabilitation Department Office [phone] Secure Chat Communication Preferred  Lona Rist 10/27/2023, 3:58 PM

## 2023-10-28 LAB — GLUCOSE, CAPILLARY
Glucose-Capillary: 97 mg/dL (ref 70–99)
Glucose-Capillary: 102 mg/dL — ABNORMAL HIGH (ref 70–99)
Glucose-Capillary: 90 mg/dL (ref 70–99)
Glucose-Capillary: 89 mg/dL (ref 70–99)
Glucose-Capillary: 96 mg/dL (ref 70–99)
Glucose-Capillary: 91 mg/dL (ref 70–99)

## 2023-10-28 LAB — CBC
HCT: 42.7 % (ref 36.0–46.0)
Hemoglobin: 13 g/dL (ref 12.0–15.0)
MCH: 29 pg (ref 26.0–34.0)
MCHC: 30.4 g/dL (ref 30.0–36.0)
MCV: 95.3 fL (ref 80.0–100.0)
Platelets: 192 10*3/uL (ref 150–400)
RBC: 4.48 MIL/uL (ref 3.87–5.11)
RDW: 15.3 % (ref 11.5–15.5)
WBC: 4.3 10*3/uL (ref 4.0–10.5)
nRBC: 0 % (ref 0.0–0.2)

## 2023-10-28 MED ORDER — ATORVASTATIN CALCIUM 10 MG PO TABS
20.0000 mg | ORAL_TABLET | Freq: Every day | ORAL | Status: DC
  Administered 2023-10-28 – 2023-11-26 (×30): 20 mg via ORAL
  Filled 2023-10-28 (×30): qty 2

## 2023-10-28 NOTE — Progress Notes (Signed)
 Inpatient Rehab Admissions Coordinator:   Per therapy recommendations pt was screened for CIR candidacy by Loye Rumble, PT, DPT.  Note on 40% trach collar with discussions of vent at night.  These are outside the parameters of what we can manage on CIR.  We can follow along from a distance to confirm plan for oxygen and if she is able to be on 35% or less trach collar and no vent then we could screen for candidacy.   Loye Rumble, PT, DPT Admissions Coordinator [phone] 10/28/23  9:20 AM

## 2023-10-28 NOTE — Progress Notes (Signed)
 PROGRESS NOTE    Angel Brewer  WUJ:[id number] DOB: 05/05/1966 DOA: 10/22/2023 PCP: Claiborne Rigg, NP   Brief Narrative:  Angel Brewer is a 58 y.o. female with medical history significant of chronic hypercarbic respiratory failure - tracheostomy dependent since May 2023(on room air at baseline), heart failure preserved EF, history of OSA on CPAP at bedtime and OSH, PE and DVT on Eliquis, and morbid obesity class III presented with worsening respiratory status after mechanical fall with multiple fractures notes (R clavicle, L tibial plateau, L1 vertebral fractures).   Assessment & Plan:   Principal Problem:   Multiple fracture Active Problems:   Chronic hypoxic respiratory failure (HCC)   Acute metabolic encephalopathy   History of pulmonary embolism   Benign essential HTN   Acute hypoxemic respiratory failure (HCC)   History of CHF (congestive heart failure)   OSA on CPAP   History of DVT (deep vein thrombosis)   Fall at home, initial encounter   Acute respiratory failure with hypoxia and hypercarbia (HCC)   Tracheostomy dependence (HCC)   Fall  Acute-on-chronic hypoxic and hypercarbic respiratory failure Acute mechanical ventilation need in the setting of respiratory failure Chronic tracheostomy status, 11/2021 Chronic obstructive sleep apnea/obesity hypoventilation syndrome - Patient presented from home after a fall with presyncope/vertiginous symptoms - Found to be remarkably hypoxic by EMS - PCCM admitted patient initially given chronic trach and profound hypoxia from baseline -appreciate insight recommendations on trach management, exchanged from Shiley 6 to cuffed 6 initially requiring ventilator - Current plan to discharge patient home with trilogy nightly once approved, otherwise patient will remain in the hospital for respiratory support given above  Mechanical fall with multiple fractures - Fractures including R clavicle, L tibial plateau, L1  vertebral fractures -Trauma, neurosurgery, Ortho evaluating the patient, continue conservative management at this time -Sling for comfort, TLSO brace likely of Axon value due to body habitus - Continue PT OT, weightbearing as tolerated - Pain currently well-controlled on NSAIDs, avoid narcotics in the setting of patient's profound respiratory history  History of recurrent unprovoked PE/DVT -Continue home Eliquis, this will be a lifelong medication   Chronic medical comorbidities Heart failure-currently euvolemic without acute exacerbation EF 55 to 60% with moderately elevated PASP; continue home furosemide HTN - Moderately well-controlled on amlodipine, furosemide HLD - Resume home Atorvastatin  Morbid obesity Body mass index is 67.39 kg/m.   DVT prophylaxis: SCDs Start: 10/23/23 0600 SCDs Start: 10/23/23 0103 Place TED hose Start: 10/23/23 0103 apixaban (ELIQUIS) tablet 5 mg   Code Status:   Code Status: Full Code  Family Communication: None present  Status is: Inpt  Dispo: The patient is from: Home              Anticipated d/c is to: TBD              Anticipated d/c date is: 24-48h              Patient currently is medically stable for discharge, currently awaiting device to ensure safe disposition  Consultants:  PCCM, Neurosurgery, orthopedics  Procedures:  None  Antimicrobials:  None   Subjective: No acute issues/events overnight  Objective: Vitals:   10/28/23 0400 10/28/23 0500 10/28/23 0524 10/28/23 0600  BP: (!) 145/74 (!) 149/75  120/75  Pulse: 70 69 67 67  Resp: 16 18 (!) 21 18  Temp: 99 F (37.2 C)     TempSrc: Oral     SpO2: 96% 96% 98% 96%  Weight:  Height:        Intake/Output Summary (Last 24 hours) at 10/28/2023 0741 Last data filed at 10/28/2023 0400 Gross per 24 hour  Intake 96.54 ml  Output 650 ml  Net -553.46 ml   Filed Weights   10/22/23 2044 10/25/23 0500  Weight: (!) 178 kg (!) 189.4 kg    Examination:  General exam:  Appears calm and comfortable  Respiratory system: Clear to auscultation. Respiratory effort normal. Trach clean and intact Cardiovascular system: S1 & S2 heard, RRR. No JVD, murmurs, rubs, gallops or clicks. No pedal edema. Gastrointestinal system: Abdomen is nondistended, soft and nontender. No organomegaly or masses felt. Normal bowel sounds heard. Central nervous system: Alert and oriented. No focal neurological deficits. Extremities: Symmetric 5 x 5 power.  Data Reviewed: I have personally reviewed following labs and imaging studies  CBC: Recent Labs  Lab 10/24/23 0711 10/25/23 0139 10/25/23 0729 10/26/23 0446 10/27/23 0512 10/27/23 1130 10/28/23 0607  WBC 6.0  --  5.5 5.1 4.3  --  4.3  NEUTROABS  --   --   --   --  2.9  --   --   HGB 13.0   < > 12.7 13.3 13.4 14.3 13.0  HCT 43.7   < > 43.0 45.1 44.5 42.0 42.7  MCV 96.3  --  97.3 98.3 96.9  --  95.3  PLT 233  --  210 174 177  --  192   < > = values in this interval not displayed.   Basic Metabolic Panel: Recent Labs  Lab 10/23/23 0408 10/23/23 0519 10/24/23 0711 10/25/23 0139 10/25/23 0448 10/25/23 0729 10/26/23 1311 10/27/23 0512 10/27/23 1130  NA 140   < > 137   < > 138 137 135 136 133*  K 4.6   < > 3.9   < > 4.3 4.4 4.5 4.6 4.5  CL 99  --  96*  --   --  97* 92* 92*  --   CO2 33*  --  31  --   --  28 37* 37*  --   GLUCOSE 101*  --  69*  --   --  75 95 100*  --   BUN 17  --  21*  --   --  18 15 13   --   CREATININE 0.98  --  1.00  --   --  0.83 0.77 0.77  --   CALCIUM 8.7*  --  8.5*  --   --  8.8* 9.2 9.4  --   MG  --   --  2.2  --   --   --   --  2.0  --   PHOS  --   --  4.4  --   --   --   --   --   --    < > = values in this interval not displayed.   GFR: Estimated Creatinine Clearance: 134.7 mL/min (by C-G formula based on SCr of 0.77 mg/dL). Liver Function Tests: Recent Labs  Lab 10/22/23 2108 10/23/23 0408 10/25/23 0729  AST 22 23 19   ALT 11 11 12   ALKPHOS 58 52 47  BILITOT 1.7* 1.5* 1.4*  PROT  7.6 6.6 6.9  ALBUMIN 3.2* 2.9* 2.9*   Coagulation Profile: Recent Labs  Lab 10/22/23 2108  INR 1.1   Cardiac Enzymes: Recent Labs  Lab 10/22/23 2108  CKTOTAL 385*   CBG: Recent Labs  Lab 10/27/23 1536 10/27/23 1927 10/27/23 2325 10/28/23 0336 10/28/23 7829  GLUCAP 91 130* 95 97 102*   Sepsis Labs: Recent Labs  Lab 10/22/23 2102 10/23/23 1018  PROCALCITON  --  <0.10  LATICACIDVEN 1.6  --     Recent Results (from the past 240 hours)  MRSA Next Gen by PCR, Nasal     Status: None   Collection Time: 10/23/23  4:41 PM   Specimen: Nasal Mucosa; Nasal Swab  Result Value Ref Range Status   MRSA by PCR Next Gen NOT DETECTED NOT DETECTED Final    Comment: (NOTE) The GeneXpert MRSA Assay (FDA approved for NASAL specimens only), is one component of a comprehensive MRSA colonization surveillance program. It is not intended to diagnose MRSA infection nor to guide or monitor treatment for MRSA infections. Test performance is not FDA approved in patients less than 50 years old. Performed at [facility], [address] [place], [place] [address]          Radiology Studies: No results found.      Scheduled Meds:  amLODipine  5 mg Oral Daily   apixaban  5 mg Oral BID   Chlorhexidine Gluconate Cloth  6 each Topical Daily   furosemide  20 mg Oral Daily   mouth rinse  15 mL Mouth Rinse Q2H   pantoprazole  40 mg Oral QHS   sodium chloride flush  3 mL Intravenous Q12H   Continuous Infusions:   LOS: 5 days   Time spent:  [name], DO [facility]  If 7PM-7AM, please contact night-coverage [email or web address]  10/28/2023, 7:41 AM

## 2023-10-28 NOTE — Progress Notes (Signed)
 NAME:  Angel Brewer, MRN:  [id number], DOB:  Sep 06, 1965, LOS: 5 ADMISSION DATE:  10/22/2023 CONSULTATION DATE:  10/23/2023 REFERRING MD:  Janalyn Shy - [facility] CHIEF COMPLAINT: Fall, acute-on-chronic respiratory failure   History of Present Illness:  58 year old woman who presented to [facility] ED 4/10 for fall and AoC RF. PMHx significant for HTN, HLD, chronic diastolic CHF (Echo 11/2021 with EF 60-65%, mild LVH), OHS/OSA s/p tracheostomy 11/2021 (on CPAP/nocturnal O2, followed by Anders Simmonds, NP) with chronic hypoxic and hypercarbic respiratory failure, DVT/PE (on Eliquis). Recent hospitalization 08/2023 for AoC RF, due to follow up in trach clinic around current time (~6 weeks from discharge date).   History is obtained primarily from chart review. Patient reportedly had a fall from the toilet around 1000 4/10AM and was unable to get up from the bathroom floor; experienced L leg pain and R shoulder pain. EMS was called and on arrival she was noted to be hypoxic on RA (SpO2 60s) and was placed on NRB 15L with SpO2 improved to 98%. Denied LOC/head injury.  On ED arrival, patient was afebrile, HR 96, BP 165/118, RR 25, SpO2 100% on NRB. CXR showed no active disease. CT Head/C-spine with NAICA, no acute displaced fracture/traumatic listhesis of C-spine. CT Chest/A/P showed new endplate deformity of L1, nondisplaced fractures of the R clavicular head, nondisplaced fractures of the proximal L humerus, tracheostomy. XR of L Femur demonstrated acute displaced tibial plateau intercondylar fracture and tricompartmental degenerative changes of the L knee.  Labs were notable for CBC WNL, INR 1.1, Na 138, K 4.5, CO2 30, Cr 0.96, AST/ALT/Alk Phos WNL, Tbili 1.7. CK 385, LA 1.6. Ethanol < 10. Initial VBG with pH 7.215/pCO2 93.7, bicarb 37.8. Patient was deemed to require mechanical ventilation and tracheostomy was exchanged by RT from #6 Shiley cuffless to #6 Shiley cuffed tracheostomy. Patient was subsequently placed on MV.  Repeat ABG improved to 7.349/pCO2 64.8/pO2 63/bicarb 35.7.  PCCM consulted for evaluation and management.  Angel Brewer states she felt dizzy prior to her fall, then was too weak too stand up (later found to have fractures as well). She feels like she has been sleeping much more lately and that her energy really never recovered from her hospitalization in February. She otherwise felt she had been doing well, no issues with her oxygenation or tracheostomy. Denies fever/chills, CP/SOB, n/v/d, recent sick contacts.  On 4/12 overnight patient on floors trach collar. Was initially Aox4. Throughout the night became unresponsive. Given narcan with some improvement in mentation but still confused. ABG showing co2 >100. Transferred to ICU and placed on vent.   Pertinent Medical History:   Past Medical History:  Diagnosis Date   Chronic diastolic CHF (congestive heart failure) (HCC) 01/05/2022   DVT (deep vein thrombosis) in pregnancy    RLE DVT 02/2016   Dyspnea    Essential hypertension 08/25/2017   Hyperlipidemia 11/21/2021   Morbid obesity (HCC)    Obesity hypoventilation syndrome (HCC) 05/03/2021   PE (pulmonary embolism) 02/29/2016   Sleep apnea    uses a cpap   Tracheostomy present (HCC) 01/05/2022   Size 6 shiley/cuffless  Last changed: 10/9     Discussion  Stable trach.  She does get a Steinberg more short of breath with exertion.  Although I did not observe it on my exam she does endorse she will occasionally have a rush of air after removing PMV following activity.  This could suggest some mild air trapping from the trach.  Otherwise she is doing fantastic with  the trach     Plan  Cont routin   Significant Hospital Events: Including procedures, antibiotic start and stop dates in addition to other pertinent events   4/10 - Presented to [facility] after fall. Hypoxic on RA with SpO2 60s per EMS, placed on NRB with improvement. Initial VBG poor with pH 7.219 4/11 has been off the vent last two  nights  Interim History / Subjective:  No acute overnight events, remains on trach collar  Given her chronic hypercapnia plan to discharge on nocturnal vent   Objective:  Blood pressure (!) 145/65, pulse 63, temperature 99 F (37.2 C), temperature source Oral, resp. rate 17, height 5\' 6"  (1.676 m), weight (!) 190 kg, last menstrual period 11/29/2015, SpO2 94%.    FiO2 (%):  [35 %-40 %] 35 %   Intake/Output Summary (Last 24 hours) at 10/28/2023 1021 Last data filed at 10/28/2023 0400 Gross per 24 hour  Intake 63.98 ml  Output 650 ml  Net -586.02 ml   Filed Weights   10/22/23 2044 10/25/23 0500 10/28/23 0707  Weight: (!) 178 kg (!) 189.4 kg (!) 190 kg   Physical Examination: General:  chronically ill-appearing F, sleeping in NAD  HEENT: MM pink/moist; trach in place and clean   Neuro: Awake, answers to call of name, follows commands , MAE x 4, Alert to self and place CV: s1s2, RRR, no m/r/g PULM:  Bilateral chest excursion, clear but diminished per bases, very shallow respirations  GI: soft, bsx4 active , ND, NT, BS +, Body mass index is 67.61 kg/m.  Extremities: warm/dry, no edema      Resolved Hospital Problem List:    Assessment & Plan:   Acute-on-chronic hypoxic and hypercarbic respiratory failure Acute mechanical ventilation need in the setting of respiratory failure Tracheostomy status, 11/2021 OSA/OHS Presented to [facility] 4/10 post-fall at home, felt dizzy prior to fall from toilet. SpO2 60% on RA on EMS arrival. Placed on NRB with significant improvement. Hypercarbic and hypoxic on gas, Shiley #6 cuffless exchanged to #6 cuffed for MV. Placed on ventilator with significant improvement in hypercarbia and mentation, now back on trach collar but given her chronic hypercapnia plan to discharge on NIV at bedtime and while napping  Plan: -plan to discharge on Trilogy at bedtime, not a candidate for CIR -weaned to ATC -PMV as able --ABG prn - follow  PT/OT  recs    Mechanical fall with R clavicle fracture Seen by  trauma/ortho/nsg -sling for comfort -WBAT -pt/ot - cont prn tylenol   History of PE/DVT -transition heparin back to home Eliquis   Hx of chf, htn, hld -prn hydralazine -resume home Norvasc 5mg   PCCM will continue to follow along   Best Practice (right click and "Reselect all SmartList Selections" daily)   Diet/type: dysphagia diet (see orders) DVT prophylaxis: DOAC GI prophylaxis: PPI Central venous access:  N/A Foley:  N/A Code Status:  full code Last date of multidisciplinary goals of care discussion [pending, plan discussed with patient ]  Labs:  CBC: Recent Labs  Lab 10/24/23 0711 10/25/23 0139 10/25/23 0729 10/26/23 0446 10/27/23 0512 10/27/23 1130 10/28/23 0607  WBC 6.0  --  5.5 5.1 4.3  --  4.3  NEUTROABS  --   --   --   --  2.9  --   --   HGB 13.0   < > 12.7 13.3 13.4 14.3 13.0  HCT 43.7   < > 43.0 45.1 44.5 42.0 42.7  MCV 96.3  --  97.3 98.3 96.9  --  95.3  PLT 233  --  210 174 177  --  192   < > = values in this interval not displayed.   Basic Metabolic Panel: Recent Labs  Lab 10/23/23 0408 10/23/23 0519 10/24/23 0711 10/25/23 0139 10/25/23 0448 10/25/23 0729 10/26/23 1311 10/27/23 0512 10/27/23 1130  NA 140   < > 137   < > 138 137 135 136 133*  K 4.6   < > 3.9   < > 4.3 4.4 4.5 4.6 4.5  CL 99  --  96*  --   --  97* 92* 92*  --   CO2 33*  --  31  --   --  28 37* 37*  --   GLUCOSE 101*  --  69*  --   --  75 95 100*  --   BUN 17  --  21*  --   --  18 15 13   --   CREATININE 0.98  --  1.00  --   --  0.83 0.77 0.77  --   CALCIUM 8.7*  --  8.5*  --   --  8.8* 9.2 9.4  --   MG  --   --  2.2  --   --   --   --  2.0  --   PHOS  --   --  4.4  --   --   --   --   --   --    < > = values in this interval not displayed.   GFR: Estimated Creatinine Clearance: 135 mL/min (by C-G formula based on SCr of 0.77 mg/dL). Recent Labs  Lab 10/22/23 2102 10/22/23 2108 10/23/23 1018  10/24/23 0711 10/25/23 0729 10/26/23 0446 10/27/23 0512 10/28/23 0607  PROCALCITON  --   --  <0.10  --   --   --   --   --   WBC  --    < >  --    < > 5.5 5.1 4.3 4.3  LATICACIDVEN 1.6  --   --   --   --   --   --   --    < > = values in this interval not displayed.   Liver Function Tests: Recent Labs  Lab 10/22/23 2108 10/23/23 0408 10/25/23 0729  AST 22 23 19   ALT 11 11 12   ALKPHOS 58 52 47  BILITOT 1.7* 1.5* 1.4*  PROT 7.6 6.6 6.9  ALBUMIN 3.2* 2.9* 2.9*   No results for input(s): "LIPASE", "AMYLASE" in the last 168 hours. No results for input(s): "AMMONIA" in the last 168 hours.  ABG:    Component Value Date/Time   PHART 7.417 10/27/2023 1130   PCO2ART 67.9 (HH) 10/27/2023 1130   PO2ART 79 (L) 10/27/2023 1130   HCO3 43.7 (H) 10/27/2023 1130   TCO2 46 (H) 10/27/2023 1130   ACIDBASEDEF 4.0 (H) 05/01/2021 0158   O2SAT 95 10/27/2023 1130    Coagulation Profile: Recent Labs  Lab 10/22/23 2108  INR 1.1   Cardiac Enzymes: Recent Labs  Lab 10/22/23 2108  CKTOTAL 385*   HbA1C: Hemoglobin A1C  Date/Time Value Ref Range Status  06/10/2016 04:41 PM 5.4  Final   Hgb A1c MFr Bld  Date/Time Value Ref Range Status  10/25/2023 07:29 AM 5.1 4.8 - 5.6 % Final    Comment:    (NOTE) Pre diabetes:          5.7%-6.4%  Diabetes:              >  6.4%  Glycemic control for   <7.0% adults with diabetes    CBG: Recent Labs  Lab 10/27/23 1536 10/27/23 1927 10/27/23 2325 10/28/23 0336 10/28/23 0738  GLUCAP 91 130* 95 97 102*   Allergies: No Known Allergies   Home Medications: Prior to Admission medications   Medication Sig Start Date End Date Taking? Authorizing Provider  amLODipine (NORVASC) 5 MG tablet Take 1 tablet (5 mg total) by mouth daily. 06/17/23  Yes Fleming, Zelda W, NP  apixaban (ELIQUIS) 5 MG TABS tablet Take 1 tablet (5 mg total) by mouth 2 (two) times daily. 06/17/23  Yes Fleming, Zelda W, NP  atorvastatin (LIPITOR) 20 MG tablet Take 1 tablet (20  mg total) by mouth daily. 06/17/23  Yes Fleming, Zelda W, NP  dapagliflozin propanediol (FARXIGA) 10 MG TABS tablet Take 1 tablet (10 mg total) by mouth daily. 06/17/23  Yes Fleming, Zelda W, NP  furosemide (LASIX) 20 MG tablet Take 1 tablet (20 mg total) by mouth daily. Take 40 mg or 2 tablets for the next 5 days then resume 20 mg or 1 tablet by mouth daily by mouth. 06/17/23  Yes [name], NP  PRESCRIPTION MEDICATION 1 each by Other route at bedtime. CPAP- At bedtime   Yes [provider]  spironolactone (ALDACTONE) 25 MG tablet Take 1 tablet (25 mg total) by mouth daily. 06/17/23  Yes [name], NP  Misc. Devices MISC Please supply patient with tracheostomy humidifier  Z93.0 03/25/22   [name], NP    Critical care time:      Patt Boozer Rayanna Matusik, PA-C [facility] See Amion for pager If no response to pager , please call [phone] until 7pm After 7:00 pm call Elink  [phone]

## 2023-10-29 LAB — GLUCOSE, CAPILLARY
Glucose-Capillary: 99 mg/dL (ref 70–99)
Glucose-Capillary: 81 mg/dL (ref 70–99)
Glucose-Capillary: 88 mg/dL (ref 70–99)
Glucose-Capillary: 105 mg/dL — ABNORMAL HIGH (ref 70–99)
Glucose-Capillary: 129 mg/dL — ABNORMAL HIGH (ref 70–99)
Glucose-Capillary: 137 mg/dL — ABNORMAL HIGH (ref 70–99)

## 2023-10-29 LAB — CBC
HCT: 44.7 % (ref 36.0–46.0)
Hemoglobin: 13.6 g/dL (ref 12.0–15.0)
MCH: 29.1 pg (ref 26.0–34.0)
MCHC: 30.4 g/dL (ref 30.0–36.0)
MCV: 95.5 fL (ref 80.0–100.0)
Platelets: 194 10*3/uL (ref 150–400)
RBC: 4.68 MIL/uL (ref 3.87–5.11)
RDW: 15.4 % (ref 11.5–15.5)
WBC: 5.2 10*3/uL (ref 4.0–10.5)
nRBC: 0 % (ref 0.0–0.2)

## 2023-10-29 NOTE — Progress Notes (Signed)
 PROGRESS NOTE    Angel Brewer  ZOX:[id number] DOB: [birth year] DOA: 10/22/2023 PCP: Claiborne Rigg, NP   Brief Narrative:  Angel Brewer is a 58 y.o. female with medical history significant of chronic hypercarbic respiratory failure - tracheostomy dependent since May 2023(on room air at baseline), heart failure preserved EF, history of OSA on CPAP at bedtime and OSH, PE and DVT on Eliquis, and morbid obesity class III presented with worsening respiratory status after mechanical fall with multiple fractures notes (R clavicle, L tibial plateau, L1 vertebral fractures).  Assessment & Plan:   Principal Problem:   Multiple fracture Active Problems:   Chronic hypoxic respiratory failure (HCC)   Acute metabolic encephalopathy   History of pulmonary embolism   Benign essential HTN   Acute hypoxemic respiratory failure (HCC)   History of CHF (congestive heart failure)   OSA on CPAP   History of DVT (deep vein thrombosis)   Fall at home, initial encounter   Acute respiratory failure with hypoxia and hypercarbia (HCC)   Tracheostomy dependence (HCC)   Fall  Acute-on-chronic hypoxic and hypercarbic respiratory failure Acute mechanical ventilation need in the setting of respiratory failure Chronic tracheostomy status, 11/2021 Chronic obstructive sleep apnea/obesity hypoventilation syndrome - Patient presented from home after a fall with presyncope/vertiginous symptoms - Found to be remarkably hypoxic by EMS - PCCM admitted patient initially given chronic trach and profound hypoxia from baseline -appreciate insight recommendations on trach management, exchanged from Shiley 6 to cuffed 6 initially requiring ventilator - Current plan to discharge patient home with trilogy nightly once approved, otherwise patient will remain in the hospital for respiratory support given above  Mechanical fall with multiple fractures - Fractures including R clavicle, L tibial plateau, L1  vertebral fractures -Trauma, neurosurgery, Ortho evaluating the patient, continue conservative management at this time -Sling for comfort, TLSO brace likely of Joines value due to body habitus - Continue PT OT, weightbearing as tolerated unclear if patient will be stable for discharge home or will require further inpatient physical therapy - Pain currently well-controlled on NSAIDs, avoid narcotics in the setting of patient's profound respiratory history  History of recurrent unprovoked PE/DVT -Continue home Eliquis, this will be a lifelong medication   Chronic medical comorbidities Heart failure-currently euvolemic without acute exacerbation EF 55 to 60% with moderately elevated PASP; continue home furosemide HTN - Moderately well-controlled on amlodipine, furosemide HLD - Resume home Atorvastatin  Morbid obesity Body mass index is 67.61 kg/m.   DVT prophylaxis: SCDs Start: 10/23/23 0600 SCDs Start: 10/23/23 0103 Place TED hose Start: 10/23/23 0103 apixaban (ELIQUIS) tablet 5 mg   Code Status:   Code Status: Full Code  Family Communication: None present  Status is: Inpt  Dispo: The patient is from: Home              Anticipated d/c is to: TBD              Anticipated d/c date is: 24-48h              Patient currently is medically stable for discharge, currently awaiting device to ensure safe disposition  Consultants:  PCCM, Neurosurgery, orthopedics  Procedures:  None  Antimicrobials:  None   Subjective: No acute issues/events overnight  Objective: Vitals:   10/29/23 0319 10/29/23 0400 10/29/23 0500 10/29/23 0600  BP:  (!) 149/67 (!) 155/67 135/74  Pulse: 70 (!) 58 62 68  Resp: 17 16 16 19   Temp:  99.3 F (37.4 C)  TempSrc:  Oral    SpO2: 96% 97% 94% 96%  Weight:      Height:        Intake/Output Summary (Last 24 hours) at 10/29/2023 0728 Last data filed at 10/28/2023 1600 Gross per 24 hour  Intake --  Output 1200 ml  Net -1200 ml   Filed Weights    10/22/23 2044 10/25/23 0500 10/28/23 0707  Weight: (!) 178 kg (!) 189.4 kg (!) 190 kg    Examination:  General exam: Appears calm and comfortable  Respiratory system: Clear to auscultation. Respiratory effort normal. Trach clean and intact Cardiovascular system: S1 & S2 heard, RRR. No JVD, murmurs, rubs, gallops or clicks. No pedal edema. Gastrointestinal system: Abdomen is nondistended, soft and nontender. No organomegaly or masses felt. Normal bowel sounds heard. Central nervous system: Alert and oriented. No focal neurological deficits. Extremities: Symmetric 5 x 5 power.  Data Reviewed: I have personally reviewed following labs and imaging studies  CBC: Recent Labs  Lab 10/25/23 0729 10/26/23 0446 10/27/23 0512 10/27/23 1130 10/28/23 0607 10/29/23 0538  WBC 5.5 5.1 4.3  --  4.3 5.2  NEUTROABS  --   --  2.9  --   --   --   HGB 12.7 13.3 13.4 14.3 13.0 13.6  HCT 43.0 45.1 44.5 42.0 42.7 44.7  MCV 97.3 98.3 96.9  --  95.3 95.5  PLT 210 174 177  --  192 194   Basic Metabolic Panel: Recent Labs  Lab 10/23/23 0408 10/23/23 0519 10/24/23 0711 10/25/23 0139 10/25/23 0448 10/25/23 0729 10/26/23 1311 10/27/23 0512 10/27/23 1130  NA 140   < > 137   < > 138 137 135 136 133*  K 4.6   < > 3.9   < > 4.3 4.4 4.5 4.6 4.5  CL 99  --  96*  --   --  97* 92* 92*  --   CO2 33*  --  31  --   --  28 37* 37*  --   GLUCOSE 101*  --  69*  --   --  75 95 100*  --   BUN 17  --  21*  --   --  18 15 13   --   CREATININE 0.98  --  1.00  --   --  0.83 0.77 0.77  --   CALCIUM 8.7*  --  8.5*  --   --  8.8* 9.2 9.4  --   MG  --   --  2.2  --   --   --   --  2.0  --   PHOS  --   --  4.4  --   --   --   --   --   --    < > = values in this interval not displayed.   GFR: Estimated Creatinine Clearance: 135 mL/min (by C-G formula based on SCr of 0.77 mg/dL). Liver Function Tests: Recent Labs  Lab 10/22/23 2108 10/23/23 0408 10/25/23 0729  AST 22 23 19   ALT 11 11 12   ALKPHOS 58 52 47   BILITOT 1.7* 1.5* 1.4*  PROT 7.6 6.6 6.9  ALBUMIN 3.2* 2.9* 2.9*   Coagulation Profile: Recent Labs  Lab 10/22/23 2108  INR 1.1   Cardiac Enzymes: Recent Labs  Lab 10/22/23 2108  CKTOTAL 385*   CBG: Recent Labs  Lab 10/28/23 1211 10/28/23 1555 10/28/23 1927 10/28/23 2321 10/29/23 0328  GLUCAP 90 89 96 91 99   Sepsis Labs:  Recent Labs  Lab 10/22/23 2102 10/23/23 1018  PROCALCITON  --  <0.10  LATICACIDVEN 1.6  --     Recent Results (from the past 240 hours)  MRSA Next Gen by PCR, Nasal     Status: None   Collection Time: 10/23/23  4:41 PM   Specimen: Nasal Mucosa; Nasal Swab  Result Value Ref Range Status   MRSA by PCR Next Gen NOT DETECTED NOT DETECTED Final    Comment: (NOTE) The GeneXpert MRSA Assay (FDA approved for NASAL specimens only), is one component of a comprehensive MRSA colonization surveillance program. It is not intended to diagnose MRSA infection nor to guide or monitor treatment for MRSA infections. Test performance is not FDA approved in patients less than 40 years old. Performed at [facility], [address] [place], [place] [address]          Radiology Studies: No results found.      Scheduled Meds:  amLODipine  5 mg Oral Daily   apixaban  5 mg Oral BID   atorvastatin  20 mg Oral Daily   Chlorhexidine Gluconate Cloth  6 each Topical Daily   furosemide  20 mg Oral Daily   mouth rinse  15 mL Mouth Rinse Q2H   pantoprazole  40 mg Oral QHS   sodium chloride flush  3 mL Intravenous Q12H   Continuous Infusions:   LOS: 6 days   Time spent:  [name], DO [facility]  If 7PM-7AM, please contact night-coverage [email or web address]  10/29/2023, 7:28 AM

## 2023-10-29 NOTE — Progress Notes (Signed)
 Physical Therapy Treatment Patient Details Name: Angel Brewer MRN: [id number] DOB: Sep 18, 1965 Today's Date: 10/29/2023   History of Present Illness Pt is a 58 y.o. female presenting from home after a fall 4/10. Hypoxic and placed on NRB. Found to have acute superior endplate deformity of L1, R clavicular head fx, L displaced tibial plateau intercondylar fx. CTH with no acute findings, fx of L proximal humerus.  PMH significant for chronic hypoxic hypercarbic respiratory failure tracheostomy dependent since May 2023, HFpEF, OSA, PE and DVT on Eliquis, and morbid obesity (BMI 67)    PT Comments  Session focused on theract to promote functional mobility and transferring training. Pt displayed improvement in her ability to stand and transfer as she completed two sit to stands, increased her sitting and standing tolerance, and performed a lateral step pivot. While the pt shows improvement, she continues to have difficulty with activity tolerance, functional mobility, transfers, gait, and static/dynamic balance as  pt shuffled and required cueing for proper sequencing and foot clearance with lateral step pivot and took additional time for all mobility. The pt's SpO2 ranged with mobility from the low 80s to low 90s, but recovered quickly with standing or sitting rest breaks to > 90%. D/c recommendations remain appropriate as the pt is motivated to improve and as shown increased activity tolerance and functional mobility since her evaluation. Will continue to follow acutely.     If plan is discharge home, recommend the following: Two people to help with walking and/or transfers;Two people to help with bathing/dressing/bathroom;Assistance with cooking/housework;Assist for transportation;Help with stairs or ramp for entrance   Can travel by private vehicle        Equipment Recommendations  Other (comment) (pending pt progress)    Recommendations for Other Services Rehab consult     Precautions  / Restrictions Precautions Precautions: Fall Recall of Precautions/Restrictions: Intact Precaution/Restrictions Comments: watch SpO2 Restrictions Weight Bearing Restrictions Per Provider Order: No RUE Weight Bearing Per Provider Order: Non weight bearing LUE Weight Bearing Per Provider Order: Weight bearing as tolerated LLE Weight Bearing Per Provider Order: Weight bearing as tolerated     Mobility  Bed Mobility Overal bed mobility: Needs Assistance Bed Mobility: Supine to Sit, Sit to Supine     Supine to sit: Max assist, +2 for physical assistance, HOB elevated Sit to supine: Max assist, +2 for physical assistance   General bed mobility comments: pt able to walk bil LEs to EOB without assistance but was unable to reach R bed rail with L hand and didn't want to pull with her R arm therefore required max A x2 with helicopter manuever for supine to sit. Max A x2 with helicopter manuever for sit to supine.    Transfers Overall transfer level: Needs assistance Equipment used: 2 person hand held assist Transfers: Sit to/from Stand, Bed to chair/wheelchair/BSC Sit to Stand: Mod assist, +2 physical assistance, From elevated surface   Step pivot transfers: Mod assist, +2 physical assistance            Ambulation/Gait               General Gait Details: deferred s/t pt presentation   Stairs             Wheelchair Mobility     Tilt Bed    Modified Rankin (Stroke Patients Only)       Balance Overall balance assessment: Needs assistance Sitting-balance support: Single extremity supported, No upper extremity supported, Feet supported Sitting balance-[name]: Fair Sitting balance -  Comments: pt ranged from supervision-mod A for sitting balance with posterior lean. Overall, pt was supervision for static sitting balance with feet supported and one or no UE supported Postural control: Posterior lean Standing balance support: During functional activity,  Reliant on assistive device for balance, Bilateral upper extremity supported Standing balance-[name]: Poor Standing balance comment: pt reliant on external support for standing balance and stands with increased BOS. Reports her R leg/knee feels unsteady                            Communication Communication Communication: No apparent difficulties  Cognition Arousal: Alert Behavior During Therapy: WFL for tasks assessed/performed   PT - Cognitive impairments: No apparent impairments                         Following commands: Intact      Cueing Cueing Techniques: Verbal cues, Visual cues  Exercises      General Comments General comments (skin integrity, edema, etc.): Pt's SpO2 ranged from the low 80s to the low 90s throughout the session. With movement, her SpO2 went as low as 81 but she would recover quickly with standing or seated rest breaks to the high 80s or low 90s. Pt's O2 was 9-10L on 35% FiO2.      Pertinent Vitals/Pain Pain Assessment Pain Assessment: Faces Faces Pain Scale: Hurts [place] more Pain Location: R shoulder or stomach with movement Pain Descriptors / Indicators: Aching, Discomfort, Grimacing, Moaning Pain Intervention(s): Monitored during session    Home Living                          Prior Function            PT Goals (current goals can now be found in the care plan section) Acute Rehab PT Goals Patient Stated Goal: return to independence PT Goal Formulation: With patient Time For Goal Achievement: 11/10/23 Potential to Achieve Goals: Good Progress towards PT goals: Progressing toward goals    Frequency    Min 2X/week      PT Plan      Co-evaluation              AM-PAC PT "6 Clicks" Mobility   Outcome Measure  Help needed turning from your back to your side while in a flat bed without using bedrails?: Total Help needed moving from lying on your back to sitting on the side of a flat bed  without using bedrails?: Total Help needed moving to and from a bed to a chair (including a wheelchair)?: Total Help needed standing up from a chair using your arms (e.g., wheelchair or bedside chair)?: Total Help needed to walk in hospital room?: Total Help needed climbing 3-5 steps with a railing? : Total 6 Click Score: 6    End of Session Equipment Utilized During Treatment: Gait belt;Oxygen Activity Tolerance: Patient tolerated treatment well;Patient limited by fatigue Patient left: in bed;with call bell/phone within reach Nurse Communication: Other (comment);Mobility status (pt in need of pericare supplies) PT Visit Diagnosis: Unsteadiness on feet (R26.81);Other abnormalities of gait and mobility (R26.89);Repeated falls (R29.6);Muscle weakness (generalized) (M62.81);Pain Pain - Right/Left: Right Pain - part of body: Shoulder     Time: 1610-9604 PT Time Calculation (min) (ACUTE ONLY): 28 min  Charges:    $Therapeutic Activity: 23-37 mins PT General Charges $$ ACUTE PT VISIT: 1 Visit  [address] [place], SPT    [place] Chung Chagoya 10/29/2023, 5:31 PM

## 2023-10-29 NOTE — TOC CM/SW Note (Signed)
 Transition of Care [facility]) - Inpatient Brief Assessment   Patient Details  Name: Angel Brewer MRN: [id number] Date of Birth: 03-Sep-1965  Transition of Care [facility]) CM/SW Contact:    Jannice Mends, LCSW Phone Number: 10/29/2023, 9:30 AM   Clinical Narrative: TOC continuing to follow. Patient remains on Vent at night.    Transition of Care Asessment: Insurance and Status: Insurance coverage has been reviewed, Selfpay Patient has primary care physician: Yes Home environment has been reviewed: From home Prior level of function:: Independent   Social Drivers of Health Review: SDOH reviewed no interventions necessary Readmission risk has been reviewed: Yes Transition of care needs: transition of care needs identified, TOC will continue to follow

## 2023-10-30 LAB — GLUCOSE, CAPILLARY
Glucose-Capillary: 96 mg/dL (ref 70–99)
Glucose-Capillary: 113 mg/dL — ABNORMAL HIGH (ref 70–99)
Glucose-Capillary: 91 mg/dL (ref 70–99)
Glucose-Capillary: 105 mg/dL — ABNORMAL HIGH (ref 70–99)
Glucose-Capillary: 147 mg/dL — ABNORMAL HIGH (ref 70–99)
Glucose-Capillary: 103 mg/dL — ABNORMAL HIGH (ref 70–99)

## 2023-10-30 MED ORDER — ORAL CARE MOUTH RINSE
15.0000 mL | OROMUCOSAL | Status: DC
  Administered 2023-10-31 – 2023-11-26 (×90): 15 mL via OROMUCOSAL

## 2023-10-30 MED ORDER — ORAL CARE MOUTH RINSE: 15.0000 mL | OROMUCOSAL | Status: DC | PRN

## 2023-10-30 NOTE — Progress Notes (Signed)
 PROGRESS NOTE    Angel Brewer  ZOX:[id number] DOB: Mar 02, 1966 DOA: 10/22/2023 PCP: [name], NP   Brief Narrative:  Angel Brewer is a 58 y.o. female with medical history significant of chronic hypercarbic respiratory failure - tracheostomy dependent since May 2023(on room air at baseline), heart failure preserved EF, history of OSA on CPAP at bedtime and OSH, PE and DVT on Eliquis , and morbid obesity class III presented with worsening respiratory status after mechanical fall with multiple fractures notes (R clavicle, L tibial plateau, L1 vertebral fractures).  Assessment & Plan:   Principal Problem:   Multiple fracture Active Problems:   Chronic hypoxic respiratory failure (HCC)   Acute metabolic encephalopathy   History of pulmonary embolism   Benign essential HTN   Acute hypoxemic respiratory failure (HCC)   History of CHF (congestive heart failure)   OSA on CPAP   History of DVT (deep vein thrombosis)   Fall at home, initial encounter   Acute respiratory failure with hypoxia and hypercarbia (HCC)   Tracheostomy dependence (HCC)   Fall  Acute-on-chronic hypoxic and hypercarbic respiratory failure Acute mechanical ventilation need in the setting of respiratory failure Chronic tracheostomy status, 11/2021 Chronic obstructive sleep apnea/obesity hypoventilation syndrome - Patient presented from home after a fall with presyncope/vertiginous symptoms - Found to be remarkably hypoxic by EMS - PCCM admitted patient initially given chronic trach and profound hypoxia from baseline -appreciate insight recommendations on trach management, exchanged from Shiley 6 to cuffed 6 initially requiring ventilator - Current plan to discharge patient home with trilogy nightly once approved, otherwise patient will remain in the hospital for respiratory support given above  Mechanical fall with multiple fractures - Fractures including R clavicle, L tibial plateau, L1  vertebral fractures -Trauma, neurosurgery, Ortho evaluating the patient, continue conservative management at this time -Sling for comfort, TLSO brace likely of Bennett value due to body habitus - Continue PT OT, weightbearing as tolerated unclear if patient will be stable for discharge home or will require further inpatient physical therapy -continues to be profoundly weak with PT - Pain currently well-controlled on NSAIDs, avoid narcotics in the setting of patient's profound respiratory history  History of recurrent unprovoked PE/DVT -Continue home Eliquis , this will be a lifelong medication   Chronic medical comorbidities Heart failure-currently euvolemic without acute exacerbation EF 55 to 60% with moderately elevated PASP; continue home furosemide  HTN - Moderately well-controlled on amlodipine , furosemide  HLD - Resume home Atorvastatin   Morbid obesity Body mass index is 67.61 kg/m.   DVT prophylaxis: SCDs Start: 10/23/23 0600 SCDs Start: 10/23/23 0103 Place TED hose Start: 10/23/23 0103 apixaban  (ELIQUIS ) tablet 5 mg   Code Status:   Code Status: Full Code  Family Communication: None present  Status is: Inpt  Dispo: The patient is from: Home              Anticipated d/c is to: TBD              Anticipated d/c date is: 24-48h              Patient currently is medically stable for discharge, currently awaiting device to ensure safe disposition  Consultants:  PCCM, Neurosurgery, orthopedics  Procedures:  None  Antimicrobials:  None   Subjective: No acute issues/events overnight, able to stand yesterday but still unable to take any progressive steps or ambulation due to pain and weakness  Objective: Vitals:   10/30/23 0400 10/30/23 0500 10/30/23 0600 10/30/23 0700  BP: 134/69 (!) 144/85  123/77 131/70  Pulse: (!) 58 62 (!) 54 (!) 55  Resp: 18 19 11 13   Temp: 98.6 F (37 C)     TempSrc: Oral     SpO2: 98% 96% 95% 98%  Weight:      Height:        Intake/Output  Summary (Last 24 hours) at 10/30/2023 0749 Last data filed at 10/30/2023 0700 Gross per 24 hour  Intake 480 ml  Output 1250 ml  Net -770 ml   Filed Weights   10/25/23 0500 10/28/23 0707 10/29/23 0719  Weight: (!) 189.4 kg (!) 190 kg (!) 190 kg    Examination:  General exam: Appears calm and comfortable  Respiratory system: Clear to auscultation. Respiratory effort normal. Trach clean and intact Cardiovascular system: S1 & S2 heard, RRR. No JVD, murmurs, rubs, gallops or clicks. No pedal edema. Gastrointestinal system: Abdomen is nondistended, soft and nontender. No organomegaly or masses felt. Normal bowel sounds heard. Central nervous system: Alert and oriented. No focal neurological deficits. Extremities: Symmetric 5 x 5 power.  Data Reviewed: I have personally reviewed following labs and imaging studies  CBC: Recent Labs  Lab 10/25/23 0729 10/26/23 0446 10/27/23 0512 10/27/23 1130 10/28/23 0607 10/29/23 0538  WBC 5.5 5.1 4.3  --  4.3 5.2  NEUTROABS  --   --  2.9  --   --   --   HGB 12.7 13.3 13.4 14.3 13.0 13.6  HCT 43.0 45.1 44.5 42.0 42.7 44.7  MCV 97.3 98.3 96.9  --  95.3 95.5  PLT 210 174 177  --  192 194   Basic Metabolic Panel: Recent Labs  Lab 10/24/23 0711 10/25/23 0139 10/25/23 0448 10/25/23 0729 10/26/23 1311 10/27/23 0512 10/27/23 1130  NA 137   < > 138 137 135 136 133*  K 3.9   < > 4.3 4.4 4.5 4.6 4.5  CL 96*  --   --  97* 92* 92*  --   CO2 31  --   --  28 37* 37*  --   GLUCOSE 69*  --   --  75 95 100*  --   BUN 21*  --   --  18 15 13   --   CREATININE 1.00  --   --  0.83 0.77 0.77  --   CALCIUM  8.5*  --   --  8.8* 9.2 9.4  --   MG 2.2  --   --   --   --  2.0  --   PHOS 4.4  --   --   --   --   --   --    < > = values in this interval not displayed.   GFR: Estimated Creatinine Clearance: 135 mL/min (by C-G formula based on SCr of 0.77 mg/dL). Liver Function Tests: Recent Labs  Lab 10/25/23 0729  AST 19  ALT 12  ALKPHOS 47  BILITOT 1.4*   PROT 6.9  ALBUMIN 2.9*   CBG: Recent Labs  Lab 10/29/23 1124 10/29/23 1540 10/29/23 1930 10/29/23 2311 10/30/23 0324  GLUCAP 88 105* 129* 137* 96   Sepsis Labs: Recent Labs  Lab 10/23/23 1018  PROCALCITON <0.10    Recent Results (from the past 240 hours)  MRSA Next Gen by PCR, Nasal     Status: None   Collection Time: 10/23/23  4:41 PM   Specimen: Nasal Mucosa; Nasal Swab  Result Value Ref Range Status   MRSA by PCR Next Gen NOT DETECTED NOT DETECTED  Final    Comment: (NOTE) The GeneXpert MRSA Assay (FDA approved for NASAL specimens only), is one component of a comprehensive MRSA colonization surveillance program. It is not intended to diagnose MRSA infection nor to guide or monitor treatment for MRSA infections. Test performance is not FDA approved in patients less than 41 years old. Performed at [facility], [address], [place], [place] [address]          Radiology Studies: No results found.      Scheduled Meds:  amLODipine   5 mg Oral Daily   apixaban   5 mg Oral BID   atorvastatin   20 mg Oral Daily   Chlorhexidine  Gluconate Cloth  6 each Topical Daily   furosemide   20 mg Oral Daily   mouth rinse  15 mL Mouth Rinse Q2H   pantoprazole   40 mg Oral QHS   sodium chloride  flush  3 mL Intravenous Q12H   Continuous Infusions:   LOS: 7 days   Time spent:  [name], DO [facility]  If 7PM-7AM, please contact night-coverage [email or web address]  10/30/2023, 7:49 AM

## 2023-10-30 NOTE — Progress Notes (Addendum)
 Speech Language Pathology Treatment: Dysphagia;Passy Muir Speaking valve  Patient Details Name: Angel Brewer MRN: [id number] DOB: 1966-02-17 Today's Date: 10/30/2023 Time: 4098-1191 SLP Time Calculation (min) (ACUTE ONLY): 17 min  Assessment / Plan / Recommendation Clinical Impression  Pt alert with PMV donned on arrival speaking in conversation with clear vocal quality and adequate volume. No distress and no signs back pressure when valve doffed. HR, RR and SpO2 were within normal range. Pt stated she has been donning and doffing valve independently and was able to demonstrate for therapist. She is on the vent at night and RN reported RT deflates cuff however SLP showed pt how to find cuff and ensure pilot balloon is flat prior to placement which she demonstrated and stated she would call RN if had difficulty. Recommend she wear valve during all waking hours with intermittent supervision.  Pt was on Dys 2 diet however her dentures have since been brought to hospital and diet was upgraded by medical staff to regular. Repositioned partially into chair positioned and observed with orange juice, water  and solid graham cracker. She coughed prior to po's but no s/s aspiration with trials. She denied coughing with liquid/food but reports intermittent globus sensation but states it eventually "feels that it goes down." Timely and complete mastication/clearance with solid. Recommend continue regular texture, thin liquids and may have pills with liquids. Pt has progressed nicely towards goals. Pt is moving towards independence with goals however will continue to follow for reiteration and observation of valve placement/checking for cuff deflation and min of 1 more time for safety and efficiency with po's. Will downgrade frequency to min 1 x/week due to progress.    HPI HPI: Patient is a 58 year old F who presented to the ED after a fall in her home resulting in L1 fx. PMH CHF, obesity hypoventilation  syndrome s/p trach, OSA, h/o PE on Eliquis . Placed on trach collar (4/11), though returned to vent after episode of unresposiveness and was administered narcan . Returned to ICU from floor. On trach collar again today 4/14      SLP Plan  Continue with current plan of care      Recommendations for follow up therapy are one component of a multi-disciplinary discharge planning process, led by the attending physician.  Recommendations may be updated based on patient status, additional functional criteria and insurance authorization.    Recommendations  Diet recommendations: Regular;Thin liquid Liquids provided via: Cup;Straw Medication Administration: Whole meds with liquid Supervision: Intermittent supervision to cue for compensatory strategies Compensations: Slow rate;Small sips/bites Postural Changes and/or Swallow Maneuvers: Seated upright 90 degrees      Patient may use Passy-Muir Speech Valve: During all waking hours (remove during sleep) PMSV Supervision: Intermittent MD: Please consider changing trach tube to :  (cuffless if able to be weaned form vent)           Oral care BID   Intermittent Supervision/Assistance Aphonia (R49.1);Dysphagia, unspecified (R13.10)     Continue with current plan of care     Angel Brewer  10/30/2023, 11:06 AM

## 2023-10-30 NOTE — Progress Notes (Signed)
 Occupational Therapy Treatment Patient Details Name: Angel Brewer MRN: [id number] DOB: [birth year] Today's Date: 10/30/2023   History of present illness Pt is a 58 y.o. female presenting from home after a fall 4/10. Hypoxic and placed on NRB. Found to have acute superior endplate deformity of L1, R clavicular head fx, L displaced tibial plateau intercondylar fx. CTH with no acute findings, fx of L proximal humerus.  PMH significant for chronic hypoxic hypercarbic respiratory failure tracheostomy dependent since May 2023, HFpEF, OSA, PE and DVT on Eliquis , and morbid obesity (BMI 67)   OT comments  Focus of session on bed level activity and establishing HEP ([employer]) for pt to complete over the weekend. Pt works very hard and is motivated to become more independent. Tolerates WB through all extremities with the exception of R shoulder. Continue to recommend post acute rehab. Acute OT to follow.  VSS throughout session.       If plan is discharge home, recommend the following:  Two people to help with walking and/or transfers;Two people to help with bathing/dressing/bathroom;Assistance with feeding;Assistance with cooking/housework;Assist for transportation;Help with stairs or ramp for entrance   Equipment Recommendations  Other (comment)    Recommendations for Other Services Rehab consult    Precautions / Restrictions Precautions Precautions: Fall Recall of Precautions/Restrictions: Intact Precaution/Restrictions Comments: watch SpO2 Required Braces or Orthoses: Sling (for comfort) Restrictions Weight Bearing Restrictions Per Provider Order: Yes RUE Weight Bearing Per Provider Order: Non weight bearing (may use RW if tolerates) LUE Weight Bearing Per Provider Order: Weight bearing as tolerated LLE Weight Bearing Per Provider Order: Weight bearing as tolerated       Mobility Bed Mobility                    Transfers                         Balance                                            ADL either performed or assessed with clinical judgement   ADL                                              Extremity/Trunk Assessment Upper Extremity Assessment RUE Deficits / Details: painful around 70 degrees FF; encouraged modified pendulums; painful with resistance            Vision       Perception     Praxis     Communication     Cognition Arousal: Alert Behavior During Therapy: WFL for tasks assessed/performed Cognition: No apparent impairments                                        Cueing      Exercises Exercises: General Lower Extremity, General Upper Extremity, Other exercises General Exercises - Upper Extremity Shoulder Flexion: AAROM, Right, 10 reps (within pain tolerance; shoulder rolls; LUE therabnad x 15 - level 1) Elbow Flexion: AROM, Right, 10 reps, Both (L therabanc x 15 level 1) Elbow Extension: AROM, Right, 10 reps General Exercises - Lower Extremity Straight Leg  Raises: Strengthening, Both, 20 reps Hip Flexion/Marching: Strengthening, Both, 20 reps, Supine Mini-Sqauts: Strengthening, Supine, Both, 20 reps (pushing against therapist) Other Exercises Other Exercises: lawnmower pull backs LUE x 20 - level 1 Other Exercises: "dead bug" x 5 each side Other Exercises: ladder pulls using LUE with sheet tied to bottom of bed Other Exercises: core strengthening crossing midlein and reaching towrad ceiling    Shoulder Instructions       General Comments      Pertinent Vitals/ Pain       Pain Assessment Pain Assessment: 0-10 Pain Score: 4  Pain Location: R shoulder Pain Descriptors / Indicators: Discomfort Pain Intervention(s): Limited activity within patient's tolerance  Home Living                                          Prior Functioning/Environment              Frequency  Min 2X/week        Progress Toward  Goals  OT Goals(current goals can now be found in the care plan section)  Progress towards OT goals: Progressing toward goals  Acute Rehab OT Goals Patient Stated Goal: get better OT Goal Formulation: With patient Time For Goal Achievement: 11/10/23 Potential to Achieve Goals: Good ADL Goals Pt Will Perform Grooming: with set-up;sitting Pt Will Perform Lower Body Dressing: with mod assist;sit to/from stand Pt Will Transfer to Toilet: with mod assist;stand pivot transfer Pt/caregiver will Perform Home Exercise Program: Increased strength;With written HEP provided;With Supervision  Plan      Co-evaluation                 AM-PAC OT "6 Clicks" Daily Activity     Outcome Measure   Help from another person eating meals?: None Help from another person taking care of personal grooming?: A [place] Help from another person toileting, which includes using toliet, bedpan, or urinal?: Total Help from another person bathing (including washing, rinsing, drying)?: A Lot Help from another person to put on and taking off regular upper body clothing?: A Lot Help from another person to put on and taking off regular lower body clothing?: Total 6 Click Score: 13    End of Session    OT Visit Diagnosis: Unsteadiness on feet (R26.81);Muscle weakness (generalized) (M62.81);History of falling (Z91.81);Pain Pain - Right/Left: Right Pain - part of body: Shoulder   Activity Tolerance Patient tolerated treatment well   Patient Left in bed;with call bell/phone within reach   Nurse Communication Other (comment) (encourage exercise)        Time: 9528-4132 OT Time Calculation (min): 38 min  Charges: OT General Charges $OT Visit: 1 Visit OT Treatments $Therapeutic Activity: 8-22 mins $Therapeutic Exercise: 23-37 mins  [place] Aliment, OT/L   Acute OT Clinical Specialist Acute Rehabilitation Services Pager [phone] Office [phone]   [facility] 10/30/2023, 4:50 PM

## 2023-10-31 LAB — GLUCOSE, CAPILLARY
Glucose-Capillary: 92 mg/dL (ref 70–99)
Glucose-Capillary: 97 mg/dL (ref 70–99)
Glucose-Capillary: 109 mg/dL — ABNORMAL HIGH (ref 70–99)
Glucose-Capillary: 95 mg/dL (ref 70–99)
Glucose-Capillary: 96 mg/dL (ref 70–99)
Glucose-Capillary: 115 mg/dL — ABNORMAL HIGH (ref 70–99)

## 2023-10-31 NOTE — Progress Notes (Signed)
 Pt taken off vent and placed on ATC 8l 40%. Pt tolerating well at this time, vitals stable, no increased WOB noted, cuff deflated, RN aware.     10/31/23 0801  Therapy Vitals  Pulse Rate (!) 53  Resp 14  MEWS Score/Color  MEWS Score 1  MEWS Score Color Green  Respiratory Assessment  Assessment Type Assess only  Respiratory Pattern Regular;Unlabored  Chest Assessment Chest expansion symmetrical  Cough Non-productive;Strong  Sputum Specimen Source Tracheostomy tube  Bilateral Breath Sounds Diminished;Rhonchi  Oxygen  Therapy/Pulse Ox  O2 Device (S)  Tracheostomy Collar  O2 Therapy Oxygen  humidified  O2 Flow Rate (L/min) 8 L/min  FiO2 (%) 40 %  SpO2 94 %  Tracheostomy Shiley Flexible 6 mm Cuffed  Placement Date/Time: 10/25/23 0745   Placed By: (c) Self  Brand: Shiley Flexible  Size (mm): 6 mm  Style: Cuffed  Status Secured with trach ties  Site Assessment Clean;Dry  Ties Assessment Clean, Dry  Cuff Pressure (cm H2O) (S)   (deflated)  Tracheostomy Equipment at bedside Yes and checklist posted at head of bed

## 2023-10-31 NOTE — Progress Notes (Signed)
 PROGRESS NOTE    Angel Brewer Radi  MVH:[id number] DOB: Jun 10, 1966 DOA: 10/22/2023 PCP: [name], NP   Brief Narrative:  Angel Brewer is a 58 y.o. female with medical history significant of chronic hypercarbic respiratory failure - tracheostomy dependent since May 2023(on room air at baseline), heart failure preserved EF, history of OSA on CPAP at bedtime and OSH, PE and DVT on Eliquis , and morbid obesity class III presented with worsening respiratory status after mechanical fall with multiple fractures notes (R clavicle, L tibial plateau, L1 vertebral fractures).  Assessment & Plan:   Principal Problem:   Multiple fracture Active Problems:   Chronic hypoxic respiratory failure (HCC)   Acute metabolic encephalopathy   History of pulmonary embolism   Benign essential HTN   Acute hypoxemic respiratory failure (HCC)   History of CHF (congestive heart failure)   OSA on CPAP   History of DVT (deep vein thrombosis)   Fall at home, initial encounter   Acute respiratory failure with hypoxia and hypercarbia (HCC)   Tracheostomy dependence (HCC)   Fall  Acute-on-chronic hypoxic and hypercarbic respiratory failure Acute mechanical ventilation need in the setting of respiratory failure Chronic tracheostomy status, 11/2021 Chronic obstructive sleep apnea/obesity hypoventilation syndrome - Patient presented from home after a fall with presyncope/vertiginous symptoms - Found to be remarkably hypoxic by EMS - PCCM admitted patient initially given chronic trach and profound hypoxia from baseline -appreciate insight recommendations on trach management, exchanged from Shiley 6 to cuffed 6 initially requiring ventilator - Current plan to discharge patient home with trilogy nightly once approved, otherwise patient will remain in the hospital for respiratory support given above  Mechanical fall with multiple fractures - Fractures including R clavicle, L tibial plateau, L1  vertebral fractures -Trauma, neurosurgery, Ortho evaluating the patient, continue conservative management at this time -Sling for comfort, TLSO brace likely of [place] value due to body habitus - Continue PT OT, weightbearing as tolerated unclear if patient will be stable for discharge home or will require further inpatient physical therapy -continues to be profoundly weak with PT - Pain currently well-controlled on NSAIDs, avoid narcotics in the setting of patient's profound respiratory history  History of recurrent unprovoked PE/DVT -Continue home Eliquis , this will be a lifelong medication   Chronic medical comorbidities Heart failure-currently euvolemic without acute exacerbation EF 55 to 60% with moderately elevated PASP; continue home furosemide  HTN - Moderately well-controlled on amlodipine , furosemide  HLD - Resume home Atorvastatin   Morbid obesity Body mass index is 63.96 kg/m.   DVT prophylaxis: SCDs Start: 10/23/23 0600 Place TED hose Start: 10/23/23 0103 apixaban  (ELIQUIS ) tablet 5 mg   Code Status:   Code Status: Full Code  Family Communication: None present  Status is: Inpt  Dispo: The patient is from: Home              Anticipated d/c is to: TBD              Anticipated d/c date is: 24-48h              Patient currently is medically stable for discharge, currently awaiting device to ensure safe disposition  Consultants:  PCCM, Neurosurgery, orthopedics  Procedures:  None  Antimicrobials:  None   Subjective: No acute issues/events overnight, able to stand yesterday but still unable to take any progressive steps or ambulation due to pain and weakness  Objective: Vitals:   10/31/23 0437 10/31/23 0500 10/31/23 0600 10/31/23 0700  BP:  134/70 (!) 140/70 137/67  Pulse: Aaron Aas)  55 (!) 51 (!) 54 (!) 51  Resp: (!) 21 17 17 14   Temp:      TempSrc:      SpO2: 96% 99% 97% 97%  Weight:  (!) 179.8 kg    Height:        Intake/Output Summary (Last 24 hours) at  10/31/2023 0714 Last data filed at 10/31/2023 0600 Gross per 24 hour  Intake 720 ml  Output 850 ml  Net -130 ml   Filed Weights   10/28/23 0707 10/29/23 0719 10/31/23 0500  Weight: (!) 190 kg (!) 190 kg (!) 179.8 kg    Examination:  General exam: Appears calm and comfortable  Respiratory system: Clear to auscultation. Respiratory effort normal. Trach clean and intact Cardiovascular system: S1 & S2 heard, RRR. No JVD, murmurs, rubs, gallops or clicks. No pedal edema. Gastrointestinal system: Abdomen is nondistended, soft and nontender. No organomegaly or masses felt. Normal bowel sounds heard. Central nervous system: Alert and oriented. No focal neurological deficits. Extremities: Symmetric 5 x 5 power.  Data Reviewed: I have personally reviewed following labs and imaging studies  CBC: Recent Labs  Lab 10/25/23 0729 10/26/23 0446 10/27/23 0512 10/27/23 1130 10/28/23 0607 10/29/23 0538  WBC 5.5 5.1 4.3  --  4.3 5.2  NEUTROABS  --   --  2.9  --   --   --   HGB 12.7 13.3 13.4 14.3 13.0 13.6  HCT 43.0 45.1 44.5 42.0 42.7 44.7  MCV 97.3 98.3 96.9  --  95.3 95.5  PLT 210 174 177  --  192 194   Basic Metabolic Panel: Recent Labs  Lab 10/25/23 0448 10/25/23 0729 10/26/23 1311 10/27/23 0512 10/27/23 1130  NA 138 137 135 136 133*  K 4.3 4.4 4.5 4.6 4.5  CL  --  97* 92* 92*  --   CO2  --  28 37* 37*  --   GLUCOSE  --  75 95 100*  --   BUN  --  18 15 13   --   CREATININE  --  0.83 0.77 0.77  --   CALCIUM   --  8.8* 9.2 9.4  --   MG  --   --   --  2.0  --    GFR: Estimated Creatinine Clearance: 130.1 mL/min (by C-G formula based on SCr of 0.77 mg/dL). Liver Function Tests: Recent Labs  Lab 10/25/23 0729  AST 19  ALT 12  ALKPHOS 47  BILITOT 1.4*  PROT 6.9  ALBUMIN 2.9*   CBG: Recent Labs  Lab 10/30/23 1109 10/30/23 1531 10/30/23 1923 10/30/23 2321 10/31/23 0324  GLUCAP 91 105* 147* 103* 92   Sepsis Labs: No results for input(s): "PROCALCITON",  "LATICACIDVEN" in the last 168 hours.   Recent Results (from the past 240 hours)  MRSA Next Gen by PCR, Nasal     Status: None   Collection Time: 10/23/23  4:41 PM   Specimen: Nasal Mucosa; Nasal Swab  Result Value Ref Range Status   MRSA by PCR Next Gen NOT DETECTED NOT DETECTED Final    Comment: (NOTE) The GeneXpert MRSA Assay (FDA approved for NASAL specimens only), is one component of a comprehensive MRSA colonization surveillance program. It is not intended to diagnose MRSA infection nor to guide or monitor treatment for MRSA infections. Test performance is not FDA approved in patients less than 80 years old. Performed at [facility], [address] [place], [place] [address]  Radiology Studies: No results found.      Scheduled Meds:  amLODipine   5 mg Oral Daily   apixaban   5 mg Oral BID   atorvastatin   20 mg Oral Daily   Chlorhexidine  Gluconate Cloth  6 each Topical Daily   furosemide   20 mg Oral Daily   mouth rinse  15 mL Mouth Rinse 4 times per day   pantoprazole   40 mg Oral QHS   sodium chloride  flush  3 mL Intravenous Q12H   Continuous Infusions:   LOS: 8 days   Time spent:  [name], DO [facility]  If 7PM-7AM, please contact night-coverage [email or web address]  10/31/2023, 7:14 AM

## 2023-11-01 LAB — GLUCOSE, CAPILLARY
Glucose-Capillary: 102 mg/dL — ABNORMAL HIGH (ref 70–99)
Glucose-Capillary: 109 mg/dL — ABNORMAL HIGH (ref 70–99)
Glucose-Capillary: 111 mg/dL — ABNORMAL HIGH (ref 70–99)
Glucose-Capillary: 99 mg/dL (ref 70–99)
Glucose-Capillary: 106 mg/dL — ABNORMAL HIGH (ref 70–99)

## 2023-11-01 NOTE — Progress Notes (Signed)
 PROGRESS NOTE    Angel Brewer  ZOX:[id number] DOB: 1966/01/17 DOA: 10/22/2023 PCP: [name], NP   Brief Narrative:  Angel Brewer is a 58 y.o. female with medical history significant of chronic hypercarbic respiratory failure - tracheostomy dependent since May 2023(on room air at baseline), heart failure preserved EF, history of OSA on CPAP at bedtime and OSH, PE and DVT on Eliquis , and morbid obesity class III presented with worsening respiratory status after mechanical fall with multiple fractures notes (R clavicle, L tibial plateau, L1 vertebral fractures).  Assessment & Plan:   Principal Problem:   Multiple fracture Active Problems:   Chronic hypoxic respiratory failure (HCC)   Acute metabolic encephalopathy   History of pulmonary embolism   Benign essential HTN   Acute hypoxemic respiratory failure (HCC)   History of CHF (congestive heart failure)   OSA on CPAP   History of DVT (deep vein thrombosis)   Fall at home, initial encounter   Acute respiratory failure with hypoxia and hypercarbia (HCC)   Tracheostomy dependence (HCC)   Fall   Acute-on-chronic hypoxic and hypercarbic respiratory failure Acute mechanical ventilation need in the setting of respiratory failure Chronic tracheostomy status, 11/2021 Chronic obstructive sleep apnea/obesity hypoventilation syndrome - Patient presented from home after a fall with presyncope/vertiginous symptoms - Found to be remarkably hypoxic by EMS - PCCM admitted patient initially given chronic trach and profound hypoxia from baseline -appreciate insight recommendations on trach management, exchanged from Shiley 6 to cuffed 6 initially requiring ventilator - Current plan to discharge patient home with trilogy nightly once approved, otherwise patient will remain in the hospital for respiratory support given above  Mechanical fall with multiple fractures - Fractures including R clavicle, L tibial plateau, L1  vertebral fractures -Trauma, neurosurgery, Ortho evaluating the patient, continue conservative management at this time -Sling for comfort, TLSO brace likely of [place] value due to body habitus - Continue PT OT, weightbearing as tolerated unclear if patient will be stable for discharge home or will require further inpatient physical therapy -continues to be profoundly weak with PT - Pain currently well-controlled on NSAIDs, avoid narcotics in the setting of patient's profound respiratory history  History of recurrent unprovoked PE/DVT -Continue home Eliquis , this will be a lifelong medication   Chronic medical comorbidities Heart failure-currently euvolemic without acute exacerbation EF 55 to 60% with moderately elevated PASP; continue home furosemide  HTN - Moderately well-controlled on amlodipine , furosemide  HLD - Resume home Atorvastatin   Morbid obesity Body mass index is 63.16 kg/m.   DVT prophylaxis: SCDs Start: 10/23/23 0600 Place TED hose Start: 10/23/23 0103 apixaban  (ELIQUIS ) tablet 5 mg   Code Status:   Code Status: Full Code  Family Communication: None present  Status is: Inpt  Dispo: The patient is from: Home              Anticipated d/c is to: TBD              Anticipated d/c date is: 24-48h              Patient currently is medically stable for discharge, currently awaiting device to ensure safe disposition  Consultants:  PCCM, Neurosurgery, orthopedics  Procedures:  None  Antimicrobials:  None   Subjective: No acute issues/events overnight, able to stand yesterday but still unable to take any progressive steps or ambulation due to pain and weakness  Objective: Vitals:   11/01/23 0300 11/01/23 0400 11/01/23 0500 11/01/23 0600  BP: 135/60 (!) 143/67 123/66 135/64  Pulse: (!) 43 (!) 43 (!) 47 (!) 50  Resp: 17 (!) 22 16 20   Temp:  97.7 F (36.5 C)    TempSrc:  Axillary    SpO2: 96% 97% 95% 97%  Weight:   (!) 177.5 kg   Height:        Intake/Output  Summary (Last 24 hours) at 11/01/2023 5784 Last data filed at 11/01/2023 0600 Gross per 24 hour  Intake 820 ml  Output 1150 ml  Net -330 ml   Filed Weights   10/29/23 0719 10/31/23 0500 11/01/23 0500  Weight: (!) 190 kg (!) 179.8 kg (!) 177.5 kg    Examination:  General exam: Appears calm and comfortable  Respiratory system: Clear to auscultation. Respiratory effort normal. Trach clean and intact Cardiovascular system: S1 & S2 heard, RRR. No JVD, murmurs, rubs, gallops or clicks. No pedal edema. Gastrointestinal system: Abdomen is nondistended, soft and nontender. No organomegaly or masses felt. Normal bowel sounds heard. Central nervous system: Alert and oriented. No focal neurological deficits. Extremities: Symmetric 5 x 5 power.  Data Reviewed: I have personally reviewed following labs and imaging studies  CBC: Recent Labs  Lab 10/25/23 0729 10/26/23 0446 10/27/23 0512 10/27/23 1130 10/28/23 0607 10/29/23 0538  WBC 5.5 5.1 4.3  --  4.3 5.2  NEUTROABS  --   --  2.9  --   --   --   HGB 12.7 13.3 13.4 14.3 13.0 13.6  HCT 43.0 45.1 44.5 42.0 42.7 44.7  MCV 97.3 98.3 96.9  --  95.3 95.5  PLT 210 174 177  --  192 194   Basic Metabolic Panel: Recent Labs  Lab 10/25/23 0729 10/26/23 1311 10/27/23 0512 10/27/23 1130  NA 137 135 136 133*  K 4.4 4.5 4.6 4.5  CL 97* 92* 92*  --   CO2 28 37* 37*  --   GLUCOSE 75 95 100*  --   BUN 18 15 13   --   CREATININE 0.83 0.77 0.77  --   CALCIUM  8.8* 9.2 9.4  --   MG  --   --  2.0  --    GFR: Estimated Creatinine Clearance: 129 mL/min (by C-G formula based on SCr of 0.77 mg/dL). Liver Function Tests: Recent Labs  Lab 10/25/23 0729  AST 19  ALT 12  ALKPHOS 47  BILITOT 1.4*  PROT 6.9  ALBUMIN 2.9*   CBG: Recent Labs  Lab 10/31/23 1139 10/31/23 1550 10/31/23 1934 10/31/23 2326 11/01/23 0311  GLUCAP 109* 95 96 115* 102*   Sepsis Labs: No results for input(s): "PROCALCITON", "LATICACIDVEN" in the last 168  hours.   Recent Results (from the past 240 hours)  MRSA Next Gen by PCR, Nasal     Status: None   Collection Time: 10/23/23  4:41 PM   Specimen: Nasal Mucosa; Nasal Swab  Result Value Ref Range Status   MRSA by PCR Next Gen NOT DETECTED NOT DETECTED Final    Comment: (NOTE) The GeneXpert MRSA Assay (FDA approved for NASAL specimens only), is one component of a comprehensive MRSA colonization surveillance program. It is not intended to diagnose MRSA infection nor to guide or monitor treatment for MRSA infections. Test performance is not FDA approved in patients less than 39 years old. Performed at [facility], [address] [place], [place] [address]          Radiology Studies: No results found.      Scheduled Meds:  amLODipine   5 mg Oral Daily  apixaban   5 mg Oral BID   atorvastatin   20 mg Oral Daily   Chlorhexidine  Gluconate Cloth  6 each Topical Daily   furosemide   20 mg Oral Daily   mouth rinse  15 mL Mouth Rinse 4 times per day   pantoprazole   40 mg Oral QHS   sodium chloride  flush  3 mL Intravenous Q12H   Continuous Infusions:   LOS: 9 days   Time spent:  [name], DO [facility]  If 7PM-7AM, please contact night-coverage [email or web address]  11/01/2023, 6:52 AM

## 2023-11-02 ENCOUNTER — Telehealth: Payer: Self-pay | Admitting: Nurse Practitioner

## 2023-11-02 LAB — GLUCOSE, CAPILLARY
Glucose-Capillary: 151 mg/dL — ABNORMAL HIGH (ref 70–99)
Glucose-Capillary: 105 mg/dL — ABNORMAL HIGH (ref 70–99)
Glucose-Capillary: 91 mg/dL (ref 70–99)
Glucose-Capillary: 73 mg/dL (ref 70–99)
Glucose-Capillary: 87 mg/dL (ref 70–99)
Glucose-Capillary: 97 mg/dL (ref 70–99)

## 2023-11-02 MED ORDER — DICLOFENAC SODIUM 1 % EX GEL
4.0000 g | Freq: Four times a day (QID) | CUTANEOUS | Status: DC | PRN
  Administered 2023-11-03 – 2023-11-19 (×4): 4 g via TOPICAL
  Filled 2023-11-02 (×2): qty 100

## 2023-11-02 NOTE — Progress Notes (Signed)
 PROGRESS NOTE    Angel Brewer  ZOX:[id number] DOB: 1966/02/28 DOA: 10/22/2023 PCP: [name], NP   Brief Narrative:  Angel Brewer is a 58 y.o. female with medical history significant of chronic hypercarbic respiratory failure - tracheostomy dependent since May 2023(on room air at baseline), heart failure preserved EF, history of OSA on CPAP at bedtime and OSH, PE and DVT on Eliquis , and morbid obesity class III presented with worsening respiratory status after mechanical fall with multiple fractures notes (R clavicle, L tibial plateau, L1 vertebral fractures).  Assessment & Plan:   Principal Problem:   Multiple fracture Active Problems:   Chronic hypoxic respiratory failure (HCC)   Acute metabolic encephalopathy   History of pulmonary embolism   Benign essential HTN   Acute hypoxemic respiratory failure (HCC)   History of CHF (congestive heart failure)   OSA on CPAP   History of DVT (deep vein thrombosis)   Fall at home, initial encounter   Acute respiratory failure with hypoxia and hypercarbia (HCC)   Tracheostomy dependence (HCC)   Fall   Acute-on-chronic hypoxic and hypercarbic respiratory failure Acute mechanical ventilation need in the setting of respiratory failure Chronic tracheostomy status, 11/2021 Chronic obstructive sleep apnea/obesity hypoventilation syndrome - Patient presented from home after a fall with presyncope/vertiginous symptoms - Found to be remarkably hypoxic by EMS - PCCM admitted patient initially given chronic trach and profound hypoxia from baseline -appreciate insight recommendations on trach management, exchanged from Shiley 6 to cuffed 6 initially requiring ventilator -Continue to titrate oxygen  down as tolerated, goal O2 88-92% - Current plan to discharge patient home vs rehab with trilogy at bedtime/while sleeping once approved, otherwise patient will remain in the hospital for respiratory support given above  Mechanical  fall with multiple fractures - Fractures including R clavicle, L tibial plateau, L1 vertebral fractures -Trauma, neurosurgery, Ortho evaluating the patient, continue conservative management at this time -Sling for comfort, TLSO brace likely of [place] value due to body habitus - Continue PT OT, weightbearing as tolerated unclear if patient will be stable for discharge home or will require further inpatient physical therapy -continues to be profoundly weak with PT - Pain currently well-controlled on NSAIDs, avoid narcotics in the setting of patient's profound respiratory history  History of recurrent unprovoked PE/DVT -Continue home Eliquis , this will be a lifelong medication   Chronic medical comorbidities Heart failure-currently appears euvolemic (difficult to tell with body habitus) without acute exacerbation EF 55 to 60% with moderately elevated PASP; continue home furosemide  HTN - Moderately well-controlled on amlodipine , furosemide  HLD - Resume home Atorvastatin   Morbid obesity Body mass index is 63.27 kg/m.   DVT prophylaxis: SCDs Start: 10/23/23 0600 Place TED hose Start: 10/23/23 0103 apixaban  (ELIQUIS ) tablet 5 mg   Code Status:   Code Status: Full Code  Family Communication: None present  Status is: Inpt  Dispo: The patient is from: Home              Anticipated d/c is to: TBD              Anticipated d/c date is: 24-48h              Patient currently is medically stable for discharge, currently awaiting device to ensure safe disposition  Consultants:  PCCM, Neurosurgery, orthopedics  Procedures:  None  Antimicrobials:  None   Subjective: No acute issues/events overnight, denies nausea vomiting diarrhea constipation headache fevers chills or chest pain  Objective: Vitals:   11/02/23 0400 11/02/23  0500 11/02/23 0600 11/02/23 0700  BP: 139/80 134/74 137/78 (!) 144/69  Pulse: (!) 52 (!) 53 (!) 58 (!) 55  Resp: 15 20 16 14   Temp: 98.8 F (37.1 C)      TempSrc: Oral     SpO2: 95% 96% 95% 93%  Weight:  (!) 177.8 kg    Height:        Intake/Output Summary (Last 24 hours) at 11/02/2023 0730 Last data filed at 11/02/2023 0556 Gross per 24 hour  Intake 740 ml  Output 400 ml  Net 340 ml   Filed Weights   10/31/23 0500 11/01/23 0500 11/02/23 0500  Weight: (!) 179.8 kg (!) 177.5 kg (!) 177.8 kg    Examination:  General:  Pleasantly resting in bed, No acute distress. HEENT:  Normocephalic atraumatic.  Sclerae nonicteric, noninjected.  Extraocular movements intact bilaterally.  Trach clean dry intact Neck:  Without mass or deformity.  Trachea is midline. Lungs:  Clear to auscultate bilaterally without rhonchi, wheeze, or rales. Heart:  Regular rate and rhythm.  Without murmurs, rubs, or gallops. Abdomen:  Soft, nontender, nondistended.  Without guarding or rebound. Extremities: Without cyanosis, clubbing, edema, or obvious deformity. Skin:  Warm and dry, no erythema.  Data Reviewed: I have personally reviewed following labs and imaging studies  CBC: Recent Labs  Lab 10/27/23 0512 10/27/23 1130 10/28/23 0607 10/29/23 0538  WBC 4.3  --  4.3 5.2  NEUTROABS 2.9  --   --   --   HGB 13.4 14.3 13.0 13.6  HCT 44.5 42.0 42.7 44.7  MCV 96.9  --  95.3 95.5  PLT 177  --  192 194   Basic Metabolic Panel: Recent Labs  Lab 10/26/23 1311 10/27/23 0512 10/27/23 1130  NA 135 136 133*  K 4.5 4.6 4.5  CL 92* 92*  --   CO2 37* 37*  --   GLUCOSE 95 100*  --   BUN 15 13  --   CREATININE 0.77 0.77  --   CALCIUM  9.2 9.4  --   MG  --  2.0  --    GFR: Estimated Creatinine Clearance: 129.1 mL/min (by C-G formula based on SCr of 0.77 mg/dL).  CBG: Recent Labs  Lab 11/01/23 1209 11/01/23 1517 11/01/23 1920 11/01/23 2313 11/02/23 0314  GLUCAP 111* 99 106* 151* 105*   Sepsis Labs: No results for input(s): "PROCALCITON", "LATICACIDVEN" in the last 168 hours.   Recent Results (from the past 240 hours)  MRSA Next Gen by PCR, Nasal      Status: None   Collection Time: 10/23/23  4:41 PM   Specimen: Nasal Mucosa; Nasal Swab  Result Value Ref Range Status   MRSA by PCR Next Gen NOT DETECTED NOT DETECTED Final    Comment: (NOTE) The GeneXpert MRSA Assay (FDA approved for NASAL specimens only), is one component of a comprehensive MRSA colonization surveillance program. It is not intended to diagnose MRSA infection nor to guide or monitor treatment for MRSA infections. Test performance is not FDA approved in patients less than 32 years old. Performed at [facility], [address], [place], [place] [address]      Radiology Studies: No results found.  Scheduled Meds:  amLODipine   5 mg Oral Daily   apixaban   5 mg Oral BID   atorvastatin   20 mg Oral Daily   Chlorhexidine  Gluconate Cloth  6 each Topical Daily   furosemide   20 mg Oral Daily   mouth rinse  15 mL  Mouth Rinse 4 times per day   pantoprazole   40 mg Oral QHS   sodium chloride  flush  3 mL Intravenous Q12H   Continuous Infusions:   LOS: 10 days   Time spent:  [name], DO [facility]  If 7PM-7AM, please contact night-coverage [email or web address]  11/02/2023, 7:30 AM

## 2023-11-02 NOTE — Progress Notes (Signed)
 Occupational Therapy Treatment Patient Details Name: Angel Brewer MRN: [id number] DOB: [birth year] Today's Date: 11/02/2023   History of present illness Pt is a 58 y.o. female presenting from home after a fall 4/10. Hypoxic and placed on NRB. Found to have acute superior endplate deformity of L1, R clavicular head fx, L displaced tibial plateau intercondylar fx. CTH with no acute findings, fx of L proximal humerus.  PMH significant for chronic hypoxic hypercarbic respiratory failure tracheostomy dependent since May 2023, HFpEF, OSA, PE and DVT on Eliquis , and morbid obesity (BMI 67)   OT comments  Pt seen as cotreat with PT to progress mobility. Mod A +2 for bed mobility and min A +2 from elevated surface to stand. Able to take several pivotal steps to chair with HHA +2. Appears able to tolerate weight bearing through RUE on RW. Will attempt use of bari walker with ambulation tomorrow. Asked nsg to order bari [facility] to progress OOB for toileting. Will assess use of AE for LB ADL. Pt primarily limited by R shoulder pain - messaged MD about possible use of pain patch. Continue to recommend post acute rehab. Acute OT will follow.       If plan is discharge home, recommend the following:  Two people to help with walking and/or transfers;Two people to help with bathing/dressing/bathroom;Assistance with feeding;Assistance with cooking/housework;Assist for transportation;Help with stairs or ramp for entrance   Equipment Recommendations  Other (comment)    Recommendations for Other Services Rehab consult    Precautions / Restrictions Precautions Precautions: Fall Precaution/Restrictions Comments: watch SpO2 Restrictions RUE Weight Bearing Per Provider Order: Non weight bearing (ok to WB thorugh RUE with RW if tolerated) LUE Weight Bearing Per Provider Order: Weight bearing as tolerated LLE Weight Bearing Per Provider Order: Weight bearing as tolerated       Mobility Bed Mobility Overal  bed mobility: Needs Assistance  Attempted to have pt roll toward L/non-painful side however pt states "I don't roll". Pt uses elevated HOB and bed rails at home. Air bed interfering with mobility at times     Supine to sit: Mod assist, +2 for physical assistance     General bed mobility comments: First attempt, lower bed rail interferring with ability to position LE/painful with pt and pt unable to stand; returned to supine; bed position elevated to reduce interference of bed rail adn increase height for sit - stand;  pt able to assist by sliding BLE off bed however needed assistance to pull with LUE. Tolerated min pressure with RUE.    Transfers Overall transfer level: Needs assistance Equipment used: 2 person hand held assist Transfers: Sit to/from Stand, Bed to chair/wheelchair/BSC Sit to Stand: Min assist, +2 physical assistance, From elevated surface     Step pivot transfers: Min assist, +2 physical assistance, From elevated surface     General transfer comment: Stood agian from chair and pt able to stand using gait belt under armpits; therapist on pt's R using gait belt, and pad and therapist on L using pad and pt pulling up by holding/locking elbows     Balance Overall balance assessment: Needs assistance   Sitting balance-[name]: Fair       Standing balance-[name]: Poor Standing balance comment: able to stand with B hands holding RW; modified marching in place                           ADL either performed or assessed with clinical judgement  ADL Overall ADL's : Needs assistance/impaired                                       General ADL Comments: Focus of sesison on mobility; asked Nsg to order bari [facility]; will assess if pt abl eto use AE for LB ADL    Extremity/Trunk Assessment Upper Extremity Assessment RUE Deficits / Details: painful with movement   Lower Extremity Assessment Lower Extremity Assessment: Defer to PT  evaluation        Vision       Perception     Praxis     Communication     Cognition Arousal: Alert Behavior During Therapy: [facility] for tasks assessed/performed Cognition: No apparent impairments                                        Cueing      Exercises Other Exercises Other Exercises: states she has been working on HEP given on Friday    Shoulder Instructions       General Comments      Pertinent Vitals/ Pain       Pain Assessment Pain Assessment: Faces Faces Pain Scale: Hurts even more Pain Location: R shoulder Pain Descriptors / Indicators: Discomfort Pain Intervention(s): Limited activity within patient's tolerance, Repositioned, Ice applied  Home Living                                          Prior Functioning/Environment              Frequency  Min 2X/week        Progress Toward Goals  OT Goals(current goals can now be found in the care plan section)  Progress towards OT goals: Progressing toward goals  Acute Rehab OT Goals Patient Stated Goal: get better and go home OT Goal Formulation: With patient Time For Goal Achievement: 11/10/23 Potential to Achieve Goals: Good ADL Goals Pt Will Perform Grooming: with set-up;sitting Pt Will Perform Lower Body Dressing: with mod assist;sit to/from stand Pt Will Transfer to Toilet: with mod assist;stand pivot transfer Pt/caregiver will Perform Home Exercise Program: Increased strength;With written HEP provided;With Supervision  Plan      Co-evaluation    PT/OT/SLP Co-Evaluation/Treatment: Yes Reason for Co-Treatment: To address functional/ADL transfers;For patient/therapist safety   OT goals addressed during session: Strengthening/ROM;ADL's and self-care      AM-PAC OT "6 Clicks" Daily Activity     Outcome Measure   Help from another person eating meals?: None Help from another person taking care of personal grooming?: A Mckone Help from another  person toileting, which includes using toliet, bedpan, or urinal?: Total Help from another person bathing (including washing, rinsing, drying)?: A Lot Help from another person to put on and taking off regular upper body clothing?: A Lot Help from another person to put on and taking off regular lower body clothing?: Total 6 Click Score: 13    End of Session Equipment Utilized During Treatment: Oxygen   OT Visit Diagnosis: Unsteadiness on feet (R26.81);Muscle weakness (generalized) (M62.81);History of falling (Z91.81);Pain Pain - Right/Left: Right Pain - part of body: Shoulder   Activity Tolerance Patient tolerated treatment well   Patient Left in chair;with call  bell/phone within reach;with chair alarm set   Nurse Communication Mobility status;Weight bearing status;Precautions;Other (comment) (pain patch R shoulder)        Time: 1478-2956 OT Time Calculation (min): 44 min  Charges: OT General Charges $OT Visit: 1 Visit OT Treatments $Self Care/Home Management : 8-22 mins  Milburn Aliment, OT/L   Acute OT Clinical Specialist Acute Rehabilitation Services Pager [phone] Office [phone]   [facility] 11/02/2023, 2:28 PM

## 2023-11-02 NOTE — Telephone Encounter (Signed)
 I spoke to the patient and she is currently hospitalized. She explained that she had been re-certified for Medicaid and now receives disability, about $2500/month, and she is not sure of the status of her Medicaid.  I told her that I can check with our Medicaid eligibility case workers to see if they can provide a status update on her Medicaid account. The patient was in agreement and message sent to DSS Eligibility case workers.

## 2023-11-02 NOTE — TOC CM/SW Note (Signed)
 Transition of Care [facility]) - Inpatient Brief Assessment   Patient Details  Name: Angel Brewer MRN: [id number] Date of Birth: 26-Jan-1966  Transition of [facility]) CM/SW Contact:    Jannice Mends, LCSW Phone Number: 11/02/2023, 9:28 AM   Clinical Narrative: TOC continuing to follow for need for Trilogy when asleep. There are multiple barriers to SNF placement if patient is unable to progress home.    Transition of Care Asessment: Insurance and Status: Insurance coverage has been reviewed, Selfpay Patient has primary care physician: Yes Home environment has been reviewed: From home Prior level of function:: Independent   Social Drivers of Health Review: SDOH reviewed no interventions necessary Readmission risk has been reviewed: Yes Transition of care needs: transition of care needs identified, TOC will continue to follow

## 2023-11-02 NOTE — Progress Notes (Signed)
 NAME:  Angel Brewer, MRN:  [id number], DOB:  [birth year], LOS: 10 ADMISSION DATE:  10/22/2023 CONSULTATION DATE:  10/23/2023 REFERRING MD:  Sundil - TRH CHIEF COMPLAINT: Fall, acute-on-chronic respiratory failure   History of Present Illness:  58 year old woman who presented to [facility] ED 4/10 for fall and AoC RF. PMHx significant for HTN, HLD, chronic diastolic CHF (Echo 11/2021 with EF 60-65%, mild LVH), OHS/OSA s/p tracheostomy 11/2021 (on CPAP/nocturnal O2, followed by Sheryle Donning, NP) with chronic hypoxic and hypercarbic respiratory failure, DVT/PE (on Eliquis ). Recent hospitalization 08/2023 for AoC RF, due to follow up in trach clinic around current time (~6 weeks from discharge date).   History is obtained primarily from chart review. Patient reportedly had a fall from the toilet around 1000 4/10AM and was unable to get up from the bathroom floor; experienced L leg pain and R shoulder pain. EMS was called and on arrival she was noted to be hypoxic on RA (SpO2 60s) and was placed on NRB 15L with SpO2 improved to 98%. Denied LOC/head injury.  On ED arrival, patient was afebrile, HR 96, BP 165/118, RR 25, SpO2 100% on NRB. CXR showed no active disease. CT Head/C-spine with NAICA, no acute displaced fracture/traumatic listhesis of C-spine. CT Chest/A/P showed new endplate deformity of L1, nondisplaced fractures of the R clavicular head, nondisplaced fractures of the proximal L humerus, tracheostomy. XR of L Femur demonstrated acute displaced tibial plateau intercondylar fracture and tricompartmental degenerative changes of the L knee.  Labs were notable for CBC WNL, INR 1.1, Na 138, K 4.5, CO2 30, Cr 0.96, AST/ALT/Alk Phos WNL, Tbili 1.7. CK 385, LA 1.6. Ethanol < 10. Initial VBG with pH 7.215/pCO2 93.7, bicarb 37.8. Patient was deemed to require mechanical ventilation and tracheostomy was exchanged by RT from #6 Shiley cuffless to #6 Shiley cuffed tracheostomy. Patient was subsequently placed on MV.  Repeat ABG improved to 7.349/pCO2 64.8/pO2 63/bicarb 35.7.  PCCM consulted for evaluation and management.  Nova states she felt dizzy prior to her fall, then was too weak too stand up (later found to have fractures as well). She feels like she has been sleeping much more lately and that her energy really never recovered from her hospitalization in February. She otherwise felt she had been doing well, no issues with her oxygenation or tracheostomy. Denies fever/chills, CP/SOB, n/v/d, recent sick contacts.  On 4/12 overnight patient on floors trach collar. Was initially Aox4. Throughout the night became unresponsive. Given narcan  with some improvement in mentation but still confused. ABG showing co2 >100. Transferred to ICU and placed on vent.   Pertinent Medical History:   Past Medical History:  Diagnosis Date   Chronic diastolic CHF (congestive heart failure) (HCC) 01/05/2022   DVT (deep vein thrombosis) in pregnancy    RLE DVT 02/2016   Dyspnea    Essential hypertension 08/25/2017   Hyperlipidemia 11/21/2021   Morbid obesity (HCC)    Obesity hypoventilation syndrome (HCC) 05/03/2021   PE (pulmonary embolism) 02/29/2016   Sleep apnea    uses a cpap   Tracheostomy present (HCC) 01/05/2022   Size 6 shiley/cuffless  Last changed: 10/9     Discussion  Stable trach.  She does get a Fussell more short of breath with exertion.  Although I did not observe it on my exam she does endorse she will occasionally have a rush of air after removing PMV following activity.  This could suggest some mild air trapping from the trach.  Otherwise she is doing fantastic with  the trach     Plan  Cont routin   Significant Hospital Events: Including procedures, antibiotic start and stop dates in addition to other pertinent events   4/10 - Presented to [facility] after fall. Hypoxic on RA with SpO2 60s per EMS, placed on NRB with improvement. Initial VBG poor with pH 7.219 4/11 has been off the vent last two  nights 4/21 No acute overnight events, remains on trach collar Given her chronic hypercapnia plan to discharge on nocturnal vent  4/21 no issues overnight, utilize event overnight  Interim History / Subjective:  Seen sitting up in bed in no acute distress, states she feels well  Objective:  Blood pressure (!) 144/69, pulse (!) 55, temperature 98.8 F (37.1 C), temperature source Oral, resp. rate 14, height 5\' 6"  (1.676 m), weight (!) 177.8 kg, last menstrual period 11/29/2015, SpO2 93%.    Vent Mode: PRVC FiO2 (%):  [35 %-40 %] 40 % Set Rate:  [15 bmp] 15 bmp Vt Set:  [470 mL] 470 mL PEEP:  [5 cmH20] 5 cmH20   Intake/Output Summary (Last 24 hours) at 11/02/2023 0741 Last data filed at 11/02/2023 0556 Gross per 24 hour  Intake 740 ml  Output 400 ml  Net 340 ml   Filed Weights   10/31/23 0500 11/01/23 0500 11/02/23 0500  Weight: (!) 179.8 kg (!) 177.5 kg (!) 177.8 kg   Physical Examination: General: Well appearing morbidly obese adult female lying in bed on ATC in no acute distress HEENT: Trach midline, MM pink/moist, PERRL,  Neuro: Alert and oriented x 3, nonfocal CV: s1s2 regular rate and rhythm, no murmur, rubs, or gallops,  PULM: Clear to auscultation bilaterally, no increased work of breathing, no added breath sounds, on ATC GI: soft, bowel sounds active in all 4 quadrants, non-tender, non-distended, tolerating oral diet Extremities: warm/dry, no edema  Skin: no rashes or lesions  Resolved Hospital Problem List:    Assessment & Plan:   Acute-on-chronic hypoxic and hypercarbic respiratory failure Acute mechanical ventilation need in the setting of respiratory failure Tracheostomy status, 11/2021 OSA/OHS -Presented to [facility] 4/10 post-fall at home, felt dizzy prior to fall from toilet. SpO2 60% on RA on EMS arrival. Placed on NRB with significant improvement. Hypercarbic and hypoxic on gas, Shiley #6 cuffless exchanged to #6 cuffed for MV. Placed on ventilator with  significant improvement in hypercarbia and mentation, now back on trach collar but given her chronic hypercapnia plan to discharge on NIV at bedtime and while napping  P: Continue ATC during the day with vent support at night Plan to discharge on trilogy at night PMV valve as able Continue oral diet PT/OT as able Aspiration precautions Prophylaxis able Encourage pulmonary hygiene VAP protocol  Mechanical fall with R clavicle fracture -Seen by  trauma/ortho/nsg P: WBAT Sling for comfort PT/OT As needed Tylenol  for pain control, minimize sedation   History of PE/DVT P: Continue home Eliquis   Hx of chf, htn, hld P: Continue home Norvasc  As needed IV hydralazine     PCCM will continue to follow along   Best Practice (right click and "Reselect all SmartList Selections" daily)   Diet/type: dysphagia diet (see orders) DVT prophylaxis: DOAC GI prophylaxis: PPI Central venous access:  N/A Foley:  N/A Code Status:  full code Last date of multidisciplinary goals of care discussion: Continue to update patient daily    Critical care time: NA  Subrina Vecchiarelli D. Raquel Cables, NP-C [facility] Personal contact information can be found on Amion  If no contact or response made please call [phone] 11/02/2023, 7:46 AM

## 2023-11-02 NOTE — Progress Notes (Addendum)
 Physical Therapy Treatment Patient Details Name: Angel Brewer MRN: [id number] DOB: 1965/09/14 Today's Date: 11/02/2023   History of Present Illness Pt is a 58 y.o. female presenting from home after a fall 4/10. Hypoxic and placed on NRB. Found to have acute superior endplate deformity of L1, R clavicular head fx, L displaced tibial plateau intercondylar fx. CTH with no acute findings, fx of L proximal humerus.  PMH significant for chronic hypoxic hypercarbic respiratory failure tracheostomy dependent since May 2023, HFpEF, OSA, PE and DVT on Eliquis , and morbid obesity (BMI 67)    PT Comments  The pt was agreeable to session, eager to continue progressing out of bed mobility. She continues to need significant assist to manage body habitus for bed mobility due to pain in RUE with any movement/use and limited core strength. The pt initially needed modA to maintain sitting balance, but was able to progress to sitting EOB without support. The pt was able to complete x3 sit-stand transfers, initially with modA of 2 and use of bed pad for improved hip extension, but improved to minA with cues for hand placement and use of momentum. Pt remains limited in activity due to pain, inability to use RUE to support wt well due to pain, but will continue to benefit from skilled PT acutely to progress gait and in attempts to decrease assistance needed for bed mobility and transfers. Given pt from home alone, will likely benefit from post-acute rehab also to progress mobility and strength to allow for greatest return to independence, pt is not yet at a level where she could safely care for herself or mobilize in her apt.    If plan is discharge home, recommend the following: Two people to help with walking and/or transfers;Two people to help with bathing/dressing/bathroom;Assistance with cooking/housework;Assist for transportation;Help with stairs or ramp for entrance   Can travel by private vehicle         Equipment Recommendations  Other (comment) (defer until gait training completed)    Recommendations for Other Services       Precautions / Restrictions Precautions Precautions: Fall Recall of Precautions/Restrictions: Intact Precaution/Restrictions Comments: watch SpO2 Restrictions Weight Bearing Restrictions Per Provider Order: Yes RUE Weight Bearing Per Provider Order: Non weight bearing (ok to WB thorugh RUE with RW if tolerated) LUE Weight Bearing Per Provider Order: Weight bearing as tolerated LLE Weight Bearing Per Provider Order: Weight bearing as tolerated     Mobility  Bed Mobility Overal bed mobility: Needs Assistance Bed Mobility: Supine to Sit, Sit to Supine     Supine to sit: +2 for physical assistance, Max assist Sit to supine: Max assist, +2 for physical assistance, +2 for safety/equipment   General bed mobility comments: First attempt, lower bed rail interferring with ability to position LE/painful with pt and pt unable to stand; returned to supine; bed position elevated to reduce interference of bed rail adn increase height for sit - stand;  pt able to assist by sliding BLE off bed however needed assistance to pull with LUE. Tolerated min pressure with RUE.    Transfers Overall transfer level: Needs assistance Equipment used: 2 person hand held assist Transfers: Sit to/from Stand, Bed to chair/wheelchair/BSC Sit to Stand: Min assist, +2 physical assistance, From elevated surface, Mod assist   Step pivot transfers: Min assist, +2 physical assistance, From elevated surface       General transfer comment: initially modA from EOB, completed x2 with improved performance each time, then stood agian from chair and pt  able to stand using gait belt under armpits; therapist on pt's R using gait belt, and pad and therapist on L using pad and pt pulling up by holding/locking elbows    Ambulation/Gait Ambulation/Gait assistance: Min assist, +2 safety/equipment Gait  Distance (Feet): 2 Feet Assistive device: 1 person hand held assist Gait Pattern/deviations: Step-to pattern, Decreased stride length Gait velocity: decreased Gait velocity interpretation: <1.31 ft/sec, indicative of household ambulator Pre-gait activities: standing marches with RW x 10 General Gait Details: limited to small lateral steps, no overt buckling, LUE support for steps to chair. then completed standing marches with BUE support on RW with good tolerance   Stairs             Wheelchair Mobility     Tilt Bed    Modified Rankin (Stroke Patients Only)       Balance Overall balance assessment: Needs assistance Sitting-balance support: Single extremity supported, No upper extremity supported, Feet supported Sitting balance-[name]: Fair Sitting balance - Comments: pt ranged from supervision-mod A for sitting balance with posterior lean. Overall, pt was supervision for static sitting balance with feet supported and one or no UE supported Postural control: Posterior lean Standing balance support: During functional activity, Reliant on assistive device for balance, Bilateral upper extremity supported Standing balance-[name]: Poor Standing balance comment: able to stand with B hands holding RW; modified marching in place                            Communication Communication Communication: No apparent difficulties Factors Affecting Communication: [name] valve  Cognition Arousal: Alert Behavior During Therapy: WFL for tasks assessed/performed   PT - Cognitive impairments: Awareness, Problem solving, Safety/Judgement                       PT - Cognition Comments: pt with poor communication of needs or understanding of  using equipement such as lift for her safety Following commands: Intact      Cueing Cueing Techniques: Verbal cues, Visual cues  Exercises      General Comments General comments (skin integrity, edema, etc.):  VSS on trach collar      Pertinent Vitals/Pain Pain Assessment Pain Assessment: Faces Faces Pain Scale: Hurts even more Pain Location: R shoulder Pain Descriptors / Indicators: Discomfort Pain Intervention(s): Limited activity within patient's tolerance, Monitored during session, Repositioned    Home Living                          Prior Function            PT Goals (current goals can now be found in the care plan section) Acute Rehab PT Goals Patient Stated Goal: return to independence PT Goal Formulation: With patient Time For Goal Achievement: 11/10/23 Potential to Achieve Goals: Good Progress towards PT goals: Progressing toward goals    Frequency    Min 2X/week      PT Plan      Co-evaluation PT/OT/SLP Co-Evaluation/Treatment: Yes Reason for Co-Treatment: To address functional/ADL transfers;For patient/therapist safety PT goals addressed during session: Mobility/safety with mobility;Balance;Proper use of DME;Strengthening/ROM OT goals addressed during session: Strengthening/ROM;ADL's and self-care      AM-PAC PT "6 Clicks" Mobility   Outcome Measure  Help needed turning from your back to your side while in a flat bed without using bedrails?: Total Help needed moving from lying on your back to sitting  on the side of a flat bed without using bedrails?: Total Help needed moving to and from a bed to a chair (including a wheelchair)?: A Lot Help needed standing up from a chair using your arms (e.g., wheelchair or bedside chair)?: A Lot Help needed to walk in hospital room?: Total Help needed climbing 3-5 steps with a railing? : Total 6 Click Score: 8    End of Session Equipment Utilized During Treatment: Gait belt;Oxygen  Activity Tolerance: Patient tolerated treatment well;Patient limited by fatigue Patient left: with call bell/phone within reach;in chair;with chair alarm set Nurse Communication: Mobility status PT Visit Diagnosis: Unsteadiness on  feet (R26.81);Other abnormalities of gait and mobility (R26.89);Repeated falls (R29.6);Muscle weakness (generalized) (M62.81);Pain Pain - Right/Left: Right Pain - part of body: Shoulder     Time: 1318-1401 PT Time Calculation (min) (ACUTE ONLY): 43 min  Charges:    $Therapeutic Exercise: 8-22 mins PT General Charges $$ ACUTE PT VISIT: 1 Visit                     Barnabas Booth, PT, DPT   Acute Rehabilitation Department Office [phone] Secure Chat Communication Preferred   Lona Rist 11/02/2023, 4:17 PM

## 2023-11-02 NOTE — Telephone Encounter (Addendum)
 Jerryl Morin, Can you please advise. Thank you.   Copied from CRM [id number]. Topic: General - Other  >> Nov 02, 2023 12:55 PM DeAngela L wrote:  Reason for CRM: Patient admitted to the [facility] 4/10 and she has disability questions, she says she was approved for disability and when in the hospital she discovered she doesn't have medical coverage and would like to ask if there is any advice to give her so she can complete the process while she is in the hospital. She just wants a instructions on what to do, she states there is no one to answer or follow up with her questions in the hospital this is why she is reaching out to the office,  Patient called asked if she could speak with Burnett Carson she was very helpful before  Patient call back number [phone] (M)

## 2023-11-03 LAB — GLUCOSE, CAPILLARY
Glucose-Capillary: 90 mg/dL (ref 70–99)
Glucose-Capillary: 88 mg/dL (ref 70–99)
Glucose-Capillary: 77 mg/dL (ref 70–99)

## 2023-11-03 NOTE — Progress Notes (Signed)
   11/03/23 0746  Oxygen  Therapy/Pulse Ox  O2 Device Tracheostomy Collar  O2 Therapy Oxygen  humidified  O2 Flow Rate (L/min) 10 L/min  FiO2 (%) 35 %  SpO2 96 %    Pt is tolerating well at this time.

## 2023-11-03 NOTE — Progress Notes (Signed)
 Speech Language Pathology Treatment: Dysphagia  Patient Details Name: Angel Brewer MRN: [id number] DOB: 11/28/1965 Today's Date: 11/03/2023 Time: 4098-1191 SLP Time Calculation (min) (ACUTE ONLY): 8 min  Assessment / Plan / Recommendation Clinical Impression  Pt alert with PMV donned on arrival speaking in conversation with clear vocal quality and adequate volume. HR, RR and SpO2 were within normal range. Pt donning and doffing valve independently. Recommend she wear valve during all waking hours with intermittent supervision. SLP assessed tolerance of regular/thin liquid diet. Patient endorses no difficulty with lunch tray - remnants present in room and patient ate majority with no reported trouble. SLP assessed continued tolerance with graham crackers and thin liquids from straw. Patient exhibited no overt s/sx of penetration/aspiration and no difficulty throughout. Patient is at goal level for PMSV use and swallowing, thus SLP will sign off. Recommend continue regular texture, thin liquids and may have pills with liquids. Always insure trach cuff is deflated prior to PMSV placement.    HPI HPI: Patient is a 58 year old F who presented to the ED after a fall in her home resulting in L1 fx. PMH CHF, obesity hypoventilation syndrome s/p trach, OSA, h/o PE on Eliquis . Placed on trach collar (4/11), though returned to vent after episode of unresposiveness and was administered narcan . Returned to ICU from floor. On trach collar again today 4/14      SLP Plan  All goals met      Recommendations for follow up therapy are one component of a multi-disciplinary discharge planning process, led by the attending physician.  Recommendations may be updated based on patient status, additional functional criteria and insurance authorization.    Recommendations  Diet recommendations: Regular;Thin liquid Liquids provided via: Cup;Straw Medication Administration: Whole meds with liquid Supervision:  Intermittent supervision to cue for compensatory strategies Compensations: Slow rate;Small sips/bites Postural Changes and/or Swallow Maneuvers: Seated upright 90 degrees      Patient may use Passy-Muir Speech Valve: During all waking hours (remove during sleep) PMSV Supervision: Intermittent MD: Please consider changing trach tube to : Cuffless           Oral care BID   Intermittent Supervision/Assistance Aphonia (R49.1);Dysphagia, unspecified (R13.10)     All goals met    Angel Brewer, M.A., CCC-SLP  Angel Brewer A Angel Brewer  11/03/2023, 3:14 PM

## 2023-11-03 NOTE — Progress Notes (Signed)
 Physical Therapy Treatment Patient Details Name: Francis Yardley MRN: [id number] DOB: August 11, 1965 Today's Date: 11/03/2023   History of Present Illness Pt is a 58 y.o. female presenting from home after a fall 4/10. Hypoxic and placed on NRB. Found to have acute superior endplate deformity of L1, R clavicular head fx, L displaced tibial plateau intercondylar fx. CTH with no acute findings, fx of L proximal humerus.  PMH significant for chronic hypoxic hypercarbic respiratory failure tracheostomy dependent since May 2023, HFpEF, OSA, PE and DVT on Eliquis , and morbid obesity (BMI 67)    PT Comments  The pt was agreeable to session with focus on progressing gait training and activity tolerance. With bed mobility, pt moved towards her left side today which resulted in improved ability to use LUE on bed rail to pull herself to EOB, but she does continue to need assist to manipulate bed position and scoot her hips to EOB to safely position at EOB. She also demos good improvement in sit-stand ability and ambulation tolerance, pt ambulating 220 ft with use of RW and minA of 2 plus chair follow. She did not need seated rest, but took frequent standing rest breaks due to RUE pain and bilateral knee pain with wt bearing. Vitals largely stable with activity, HR 110-127bpm, pt denies SOB and SpO2 95% when reliable reading on 10L 45% FiO2 via trach collar. Will continue to benefit from max skilled therapies as pt is making good progress but continues to benefit from cues for safety, assist to set up tasks, and assist to complete self-care while pt working on standing tolerance/balance. Is not yet able to return home without constant assistance available.   If plan is discharge home, recommend the following: Two people to help with walking and/or transfers;Two people to help with bathing/dressing/bathroom;Assistance with cooking/housework;Assist for transportation;Help with stairs or ramp for entrance   Can  travel by private vehicle        Equipment Recommendations  Other (comment) (defer until gait training completed)    Recommendations for Other Services       Precautions / Restrictions Precautions Precautions: Fall Recall of Precautions/Restrictions: Intact Precaution/Restrictions Comments: watch SpO2, on 10L trach collar Restrictions Weight Bearing Restrictions Per Provider Order: Yes RUE Weight Bearing Per Provider Order: Non weight bearing (ok to WB thorugh RUE with RW if tolerated) LUE Weight Bearing Per Provider Order: Weight bearing as tolerated LLE Weight Bearing Per Provider Order: Weight bearing as tolerated     Mobility  Bed Mobility Overal bed mobility: Needs Assistance Bed Mobility: Supine to Sit, Sit to Supine     Supine to sit: +2 for physical assistance, Mod assist, HOB elevated, Used rails     General bed mobility comments: pt going towards her left side today which resulted in improved ability to use LUE on bed rail to pull herself to EOB, modA to trunk and to bed pads to complete scooting to EOB as well as support at EOB to prevent sliding off EOB.    Transfers Overall transfer level: Needs assistance Equipment used: Rolling walker (2 wheels) Transfers: Sit to/from Stand Sit to Stand: Min assist, +2 physical assistance, From elevated surface, Mod assist           General transfer comment: minA of 2 to rise with pt cued to use LUE to push from bed and RUE resting on RW, assist at hip to power up and gait belt to stedy, pt able to tolerate wt on RUE ot maintain standing  Ambulation/Gait Ambulation/Gait assistance: Min assist, +2 safety/equipment, +2 physical assistance (chair follow for safety) Gait Distance (Feet): 220 Feet (frequent standing rest breaks but no seated rest) Assistive device: Rolling walker (2 wheels) Gait Pattern/deviations: Decreased stride length, Step-through pattern Gait velocity: decreased     General Gait Details: pt  taking frequent staing rest breaks due to pain in bilateral knees and R shoulder with wt bearing but no seated rest. HR 110-127bpm with activity. Pt denies SOB and SpO2 95% when reliable reading on 10L  45% fFiO2 via trach collar      Balance Overall balance assessment: Needs assistance Sitting-balance support: Single extremity supported, No upper extremity supported, Feet supported Sitting balance-[name]: Fair Sitting balance - Comments: pt ranged from supervision-mod A for sitting balance with posterior lean. Overall, pt was supervision for static sitting balance with feet supported and one or no UE supported Postural control: Posterior lean Standing balance support: During functional activity, Reliant on assistive device for balance, Bilateral upper extremity supported Standing balance-[name]: Poor Standing balance comment: able to stand with B hands holding RW;                            Communication Communication Communication: No apparent difficulties Factors Affecting Communication: [name] valve  Cognition Arousal: Alert Behavior During Therapy: WFL for tasks assessed/performed   PT - Cognitive impairments: Awareness, Problem solving, Safety/Judgement                       PT - Cognition Comments: pt with poor communication of needs and particular about how to manage equipment, abel to discern needs through continued communication. Following commands: Intact      Cueing Cueing Techniques: Verbal cues, Visual cues  Exercises      General Comments General comments (skin integrity, edema, etc.): rectal pouch noted to be leaking upon our arrival, pt cleaned. with ambulation: HR 110-127bpm, pt denies SOB and SpO2 95% when reliable reading on 10L 45% FiO2 via trach collar      Pertinent Vitals/Pain Pain Assessment Pain Assessment: Faces Faces Pain Scale: Hurts even more Pain Location: R shoulder, bilateral knees with walking Pain  Descriptors / Indicators: Discomfort Pain Intervention(s): Limited activity within patient's tolerance, Monitored during session, Repositioned, Ice applied (discussed voltaren  gel for knees)     PT Goals (current goals can now be found in the care plan section) Acute Rehab PT Goals Patient Stated Goal: return to independence PT Goal Formulation: With patient Time For Goal Achievement: 11/10/23 Potential to Achieve Goals: Good Progress towards PT goals: Progressing toward goals    Frequency    Min 2X/week      PT Plan      Co-evaluation PT/OT/SLP Co-Evaluation/Treatment: Yes Reason for Co-Treatment: To address functional/ADL transfers;For patient/therapist safety PT goals addressed during session: Mobility/safety with mobility;Balance;Proper use of DME;Strengthening/ROM        AM-PAC PT "6 Clicks" Mobility   Outcome Measure  Help needed turning from your back to your side while in a flat bed without using bedrails?: Total Help needed moving from lying on your back to sitting on the side of a flat bed without using bedrails?: A Lot Help needed moving to and from a bed to a chair (including a wheelchair)?: A Lot Help needed standing up from a chair using your arms (e.g., wheelchair or bedside chair)?: A Lot Help needed to walk in hospital room?: A Lot Help needed  climbing 3-5 steps with a railing? : Total 6 Click Score: 10    End of Session Equipment Utilized During Treatment: Gait belt;Oxygen  Activity Tolerance: Patient tolerated treatment well;Patient limited by fatigue Patient left: with call bell/phone within reach;in chair;with chair alarm set Nurse Communication: Mobility status PT Visit Diagnosis: Unsteadiness on feet (R26.81);Other abnormalities of gait and mobility (R26.89);Repeated falls (R29.6);Muscle weakness (generalized) (M62.81);Pain Pain - Right/Left: Right Pain - part of body: Shoulder     Time: 8657-8469 PT Time Calculation (min) (ACUTE ONLY): 40  min  Charges:    $Gait Training: 8-22 mins $Therapeutic Exercise: 8-22 mins PT General Charges $$ ACUTE PT VISIT: 1 Visit                     Barnabas Booth, PT, DPT   Acute Rehabilitation Department Office [phone] Secure Chat Communication Preferred   Lona Rist 11/03/2023, 2:03 PM

## 2023-11-03 NOTE — Progress Notes (Signed)
 PROGRESS NOTE    [name]  HYQ:[id number] DOB: [birth year] DOA: 10/22/2023 PCP: [name], NP   Brief Narrative:  Angel Brewer is a 58 y.o. female with medical history significant of chronic hypercarbic respiratory failure - tracheostomy dependent since May 2023(on room air at baseline), heart failure preserved EF, history of OSA on CPAP at bedtime and OSH, PE and DVT on Eliquis , and morbid obesity class III presented with worsening respiratory status after mechanical fall with multiple fractures notes (R clavicle, L tibial plateau, L1 vertebral fractures).  Assessment & Plan:   Principal Problem:   Multiple fracture Active Problems:   Chronic hypoxic respiratory failure (HCC)   Acute metabolic encephalopathy   History of pulmonary embolism   Benign essential HTN   Acute hypoxemic respiratory failure (HCC)   History of CHF (congestive heart failure)   OSA on CPAP   History of DVT (deep vein thrombosis)   Fall at home, initial encounter   Acute respiratory failure with hypoxia and hypercarbia (HCC)   Tracheostomy dependence (HCC)   Fall   Acute-on-chronic hypoxic and hypercarbic respiratory failure Acute mechanical ventilation need in the setting of respiratory failure Chronic tracheostomy status, 11/2021 Chronic obstructive sleep apnea/obesity hypoventilation syndrome - Patient presented from home after a fall with presyncope/vertiginous symptoms - Found to be remarkably hypoxic by EMS - PCCM admitted patient initially given chronic trach and profound hypoxia from baseline -appreciate insight recommendations on trach management, exchanged from Shiley 6 to cuffed 6 initially requiring ventilator -Continue to titrate oxygen  down as tolerated, goal O2 88-92% - Current plan to discharge patient home vs rehab with Trilogy at bedtime/while sleeping once approved, otherwise patient will remain in the hospital for respiratory support given above  Mechanical  fall with multiple fractures - Fractures including R clavicle, L tibial plateau, L1 vertebral fractures -Trauma, neurosurgery, Ortho evaluating the patient, continue conservative management at this time -Sling for comfort, TLSO brace likely of [place] value due to body habitus - Continue PT OT, weightbearing as tolerated unclear if patient will be stable for discharge home or will require further inpatient physical therapy -continues to be profoundly weak with PT - Pain currently well-controlled on NSAIDs, avoid narcotics in the setting of patient's profound respiratory history  History of recurrent unprovoked PE/DVT -Continue home Eliquis , this will be a lifelong medication   Chronic medical comorbidities Heart failure-currently appears euvolemic (difficult to tell with body habitus) without acute exacerbation EF 55 to 60% with moderately elevated PASP; continue home furosemide  HTN - Moderately well-controlled on amlodipine , furosemide  HLD - Resume home Atorvastatin   Morbid obesity Body mass index is 63.27 kg/m.   DVT prophylaxis: SCDs Start: 10/23/23 0600 Place TED hose Start: 10/23/23 0103 apixaban  (ELIQUIS ) tablet 5 mg   Code Status:   Code Status: Full Code  Family Communication: None present  Status is: Inpt  Dispo: The patient is from: Home              Anticipated d/c is to: TBD              Anticipated d/c date is: 24-48h              Patient currently is medically stable for discharge, currently awaiting device to ensure safe disposition  Consultants:  PCCM, Neurosurgery, orthopedics  Procedures:  None  Antimicrobials:  None   Subjective: No acute issues/events overnight, denies nausea vomiting diarrhea constipation headache fevers chills or chest pain  Objective: Vitals:   11/03/23 0500 11/03/23  0600 11/03/23 0700 11/03/23 0709  BP: 128/63 139/69 (!) 150/70   Pulse: (!) 46 68 (!) 47   Resp: 15 19 13    Temp:      TempSrc:      SpO2: 95% 100% 93%    Weight:    (!) 177.8 kg  Height:        Intake/Output Summary (Last 24 hours) at 11/03/2023 0747 Last data filed at 11/03/2023 0600 Gross per 24 hour  Intake --  Output 1900 ml  Net -1900 ml   Filed Weights   11/01/23 0500 11/02/23 0500 11/03/23 0709  Weight: (!) 177.5 kg (!) 177.8 kg (!) 177.8 kg    Examination:  General:  Pleasantly resting in bed, No acute distress. HEENT:  Normocephalic atraumatic.  Sclerae nonicteric, noninjected.  Extraocular movements intact bilaterally.  Trach clean dry intact Neck:  Without mass or deformity.  Trachea is midline. Lungs:  Clear to auscultate bilaterally without rhonchi, wheeze, or rales. Heart:  Regular rate and rhythm.  Without murmurs, rubs, or gallops. Abdomen:  Soft, nontender, nondistended.  Without guarding or rebound. Extremities: Without cyanosis, clubbing, edema, or obvious deformity. Skin:  Warm and dry, no erythema.  Data Reviewed: I have personally reviewed following labs and imaging studies  CBC: Recent Labs  Lab 10/27/23 1130 10/28/23 0607 10/29/23 0538  WBC  --  4.3 5.2  HGB 14.3 13.0 13.6  HCT 42.0 42.7 44.7  MCV  --  95.3 95.5  PLT  --  192 194   Basic Metabolic Panel: Recent Labs  Lab 10/27/23 1130  NA 133*  K 4.5   GFR: Estimated Creatinine Clearance: 129.1 mL/min (by C-G formula based on SCr of 0.77 mg/dL).  CBG: Recent Labs  Lab 11/02/23 0742 11/02/23 1152 11/02/23 1536 11/02/23 1927 11/03/23 0743  GLUCAP 91 73 87 97 90   Sepsis Labs: No results for input(s): "PROCALCITON", "LATICACIDVEN" in the last 168 hours.   No results found for this or any previous visit (from the past 240 hours).    Radiology Studies: No results found.  Scheduled Meds:  amLODipine   5 mg Oral Daily   apixaban   5 mg Oral BID   atorvastatin   20 mg Oral Daily   Chlorhexidine  Gluconate Cloth  6 each Topical Daily   furosemide   20 mg Oral Daily   mouth rinse  15 mL Mouth Rinse 4 times per day   pantoprazole    40 mg Oral QHS   sodium chloride  flush  3 mL Intravenous Q12H   Continuous Infusions:   LOS: 11 days   Time spent:  [name], DO [facility]  If 7PM-7AM, please contact night-coverage [email or web address]  11/03/2023, 7:47 AM

## 2023-11-03 NOTE — Telephone Encounter (Signed)
 Message received from Burke Carolus, DSS stating the patient is only eligible for [facility] as of 10/13/2023 and her caseworker is  Thersia Flax, who can be reached at [phone]

## 2023-11-03 NOTE — Progress Notes (Signed)
 Occupational Therapy Treatment Patient Details Name: Angel Brewer MRN: [id number] DOB: 1965-09-12 Today's Date: 11/03/2023   History of present illness Pt is a 58 y.o. female presenting from home after a fall 4/10. Hypoxic and placed on NRB. Found to have acute superior endplate deformity of L1, R clavicular head fx, L displaced tibial plateau intercondylar fx. CTH with no acute findings, fx of L proximal humerus.  PMH significant for chronic hypoxic hypercarbic respiratory failure tracheostomy dependent since May 2023, HFpEF, OSA, PE and DVT on Eliquis , and morbid obesity (BMI 67)   OT comments  Omer is making excellent progress with progressing mobility @ RW level with VSS on 10 L FIO2 45%. No respiratory issues with mobility. Pt apprehensive about L knee  at time and complaining of knee pain however tolerated ambulating over 200 ft with multiple standing rest breaks. Discussion regarding the use of AE to increase independence with ADL. Discussed need to progress to using the [facility] as well to work towards DC home. Acute OT will continue to follow to facilitate safe DC home. Discussed pt as potential Star candidate to facilitate DC home.        If plan is discharge home, recommend the following:  Two people to help with walking and/or transfers;Two people to help with bathing/dressing/bathroom;Assistance with feeding;Assistance with cooking/housework;Assist for transportation;Help with stairs or ramp for entrance   Equipment Recommendations  BSC/3in1 (bari)    Recommendations for Other Services Rehab consult    Precautions / Restrictions Precautions Precautions: Fall Recall of Precautions/Restrictions: Intact Precaution/Restrictions Comments: watch SpO2, on 10L trach collar Restrictions Weight Bearing Restrictions Per Provider Order: Yes RUE Weight Bearing Per Provider Order: Non weight bearing (ok to WB thorugh RUE with RW if tolerated) LUE Weight Bearing Per Provider  Order: Weight bearing as tolerated LLE Weight Bearing Per Provider Order: Weight bearing as tolerated       Mobility Bed Mobility Overal bed mobility: Needs Assistance Bed Mobility: Supine to Sit, Sit to Supine     Supine to sit: +2 for physical assistance, Mod assist, HOB elevated, Used rails     General bed mobility comments: pt going towards her left side today which resulted in improved ability to use LUE on bed rail to pull herself to EOB, modA to trunk and to bed pads to complete scooting to EOB as well as support at EOB to prevent sliding off EOB.    Transfers Overall transfer level: Needs assistance Equipment used: Rolling walker (2 wheels) Transfers: Sit to/from Stand Sit to Stand: Min assist, +2 physical assistance, From elevated surface, Mod assist           General transfer comment: minA of 2 to rise with pt cued to use LUE to push from bed and RUE resting on RW, assist at hip to power up and gait belt to stedy, pt able to tolerate wt on RUE ot maintain standing     Balance Overall balance assessment: Needs assistance Sitting-balance support: Single extremity supported, No upper extremity supported, Feet supported Sitting balance-[name]: Fair Sitting balance - Comments: pt ranged from supervision-mod A for sitting balance with posterior lean. Overall, pt was supervision for static sitting balance with feet supported and one or no UE supported Postural control: Posterior lean Standing balance support: During functional activity, Reliant on assistive device for balance, Bilateral upper extremity supported Standing balance-[name]: Poor Standing balance comment: able to stand with B hands holding RW; able to release 1 hand at a time  ADL either performed or assessed with clinical judgement   ADL Overall ADL's : Needs assistance/impaired                                     Functional mobility during  ADLs: Cueing for safety;Minimal assistance;+2 for physical assistance General ADL Comments: At baseline pt uses AE for LB ADL: reacher, sock-aid, long handled sponge; long loofa for pericare. Pt has difficulty using a toilet tong however would benefit from further assessment of AE. For pericare, pt has a hand held shower head which she uses    Extremity/Trunk Assessment Upper Extremity Assessment Upper Extremity Assessment: Right hand dominant;RUE deficits/detail RUE Deficits / Details: using on RW and as tolerated; limited shoulder movement due to pain from clavicle fracture RUE Coordination: decreased gross motor   Lower Extremity Assessment Lower Extremity Assessment: Defer to PT evaluation        Vision       Perception     Praxis     Communication Communication Communication: No apparent difficulties Factors Affecting Communication: [name] valve   Cognition Arousal: Alert Behavior During Therapy: WFL for tasks assessed/performed                                 Following commands: Intact        Cueing   Cueing Techniques: Verbal cues, Visual cues  Exercises      Shoulder Instructions       General Comments rectal pouch noted to be leaking upon our arrival, pt cleaned. with ambulation: HR 110-127bpm, pt denies SOB and SpO2 95% when reliable reading on 10L 45% FiO2 via trach collar    Pertinent Vitals/ Pain       Pain Assessment Pain Assessment: Faces Faces Pain Scale: Hurts even more Pain Location: R shoulder, bilateral knees with walking Pain Descriptors / Indicators: Discomfort Pain Intervention(s): Limited activity within patient's tolerance  Home Living                                          Prior Functioning/Environment              Frequency  Min 3X/week        Progress Toward Goals  OT Goals(current goals can now be found in the care plan section)  Progress towards OT goals: Progressing toward  goals  Acute Rehab OT Goals Patient Stated Goal: get better and go home OT Goal Formulation: With patient Time For Goal Achievement: 11/10/23 Potential to Achieve Goals: Good ADL Goals Pt Will Perform Grooming: with set-up;sitting Pt Will Perform Lower Body Dressing: with mod assist;sit to/from stand Pt Will Transfer to Toilet: with mod assist;stand pivot transfer Pt/caregiver will Perform Home Exercise Program: Increased strength;With written HEP provided;With Supervision  Plan      Co-evaluation      Reason for Co-Treatment: To address functional/ADL transfers;For patient/therapist safety PT goals addressed during session: Mobility/safety with mobility;Balance;Proper use of DME;Strengthening/ROM OT goals addressed during session: ADL's and self-care;Strengthening/ROM      AM-PAC OT "6 Clicks" Daily Activity     Outcome Measure   Help from another person eating meals?: None Help from another person taking care of personal grooming?: A [place]rell Help from another person toileting,  which includes using toliet, bedpan, or urinal?: Total Help from another person bathing (including washing, rinsing, drying)?: A Lot Help from another person to put on and taking off regular upper body clothing?: A Lot Help from another person to put on and taking off regular lower body clothing?: Total 6 Click Score: 13    End of Session Equipment Utilized During Treatment: Oxygen  (10L)  OT Visit Diagnosis: Unsteadiness on feet (R26.81);Muscle weakness (generalized) (M62.81);History of falling (Z91.81);Pain Pain - Right/Left: Right Pain - part of body: Shoulder   Activity Tolerance Patient tolerated treatment well   Patient Left in chair;with call bell/phone within reach;with chair alarm set   Nurse Communication Mobility status;Weight bearing status;Precautions;Other (comment) (use of voltaren  gel for B knees)        Time: 4098-1191 OT Time Calculation (min): 45 min  Charges: OT General  Charges $OT Visit: 1 Visit OT Treatments $Self Care/Home Management : 8-22 mins  Milburn Aliment, OT/L   Acute OT Clinical Specialist Acute Rehabilitation Services Pager [phone] Office [phone]   [facility] 11/03/2023, 2:54 PM

## 2023-11-03 NOTE — Telephone Encounter (Signed)
 I spoke to the patient and informed her that she has [employer] Medicaid which offers minimal health care benefits. I offered to refer her to the [place] [facility]igator with [employer] [place] for assistance with assessing her options for insurance now that she is no longer covered by full Medical[employer].  She was in agreement and the referral was placed.  She will need to discuss her status with hospital bills with her hospital SW

## 2023-11-04 LAB — GLUCOSE, CAPILLARY
Glucose-Capillary: 78 mg/dL (ref 70–99)
Glucose-Capillary: 110 mg/dL — ABNORMAL HIGH (ref 70–99)

## 2023-11-04 NOTE — Progress Notes (Signed)
 PROGRESS NOTE  Angel Brewer KGM:[id number] DOB: 16-Sep-1965 DOA: 10/22/2023 PCP: [name], NP   LOS: 12 days   Brief Narrative / Interim history: 58 year old female with chronic hypercarbic respiratory failure, trach dependent since May 2023, chronic diastolic CHF, OSA on CPAP at bedtime, PE and DVT on Eliquis , comes into the hospital with worsening respiratory status after mechanical fall with multiple fractures (right clavicle, left tibial plateau, L1 vertebral fractures).  Critical care consulted, found to be vent dependent at nighttime.  Plans are in place to obtain a trilogy for her, however there may be some financial/insurance barriers.  Subjective / 24h Interval events: She is doing well this morning.  She denies any shortness of breath.  She went on the vent around 2 AM   Assesement and Plan: Principal Problem:   Multiple fracture Active Problems:   Chronic hypoxic respiratory failure (HCC)   Acute metabolic encephalopathy   History of pulmonary embolism   Benign essential HTN   Acute hypoxemic respiratory failure (HCC)   History of CHF (congestive heart failure)   OSA on CPAP   History of DVT (deep vein thrombosis)   Fall at home, initial encounter   Acute respiratory failure with hypoxia and hypercarbia (HCC)   Tracheostomy dependence (HCC)   Fall   Principal problem Acute on chronic hypoxic and hypercarbic respiratory failure, chronic tracheal stenosis status, chronic OSA/obesity hypoventilation syndrome -PCCM consulted, appreciate input.  Currently seems to be requiring vent support overnight, and she is stable during the day. - Per PCCM, it may be reasonable to trial her off nocturnal ventilation periodically to see if she still needs it - TOC working to see if trilogy can be obtained to be used at bedtime/while sleeping.  For now while needing vent cannot be discharged  Active problems Mechanical fall, multiple fractures -including right clavicle,  left tibial plateau, L1 vertebral fractures.  Trauma, neurosurgery, orthopedic surgery saw and evaluated patient, for now continue conservative management.  She is to wear sling for comfort, TLSO brace of Kush value due to body habitus.  Weightbearing as tolerated.  Continue to work with physical therapy, current recommendations are for CIR  History of recurrent PE/DVT-continue Eliquis   Chronic diastolic CHF-continue home furosemide   Essential hypertension-continue medications as below  Hyperlipidemia-continue statin  Obesity, morbid-BMI 63  Scheduled Meds:  amLODipine   5 mg Oral Daily   apixaban   5 mg Oral BID   atorvastatin   20 mg Oral Daily   Chlorhexidine  Gluconate Cloth  6 each Topical Daily   furosemide   20 mg Oral Daily   mouth rinse  15 mL Mouth Rinse 4 times per day   pantoprazole   40 mg Oral QHS   sodium chloride  flush  3 mL Intravenous Q12H   Continuous Infusions: PRN Meds:.acetaminophen  (TYLENOL ) oral liquid 160 mg/5 mL, diclofenac  Sodium, docusate sodium , hydrALAZINE , ipratropium-albuterol , naLOXone  (NARCAN )  injection, ondansetron  **OR** ondansetron  (ZOFRAN ) IV, mouth rinse, polyethylene glycol, sodium chloride  flush  Current Outpatient Medications  Medication Instructions   amLODipine  (NORVASC ) 5 mg, Oral, Daily   apixaban  (ELIQUIS ) 5 mg, Oral, 2 times daily   atorvastatin  (LIPITOR) 20 mg, Oral, Daily   dapagliflozin  propanediol (FARXIGA ) 10 mg, Oral, Daily   furosemide  (LASIX ) 20 mg, Oral, Daily, Take 40 mg or 2 tablets for the next 5 days then resume 20 mg or 1 tablet by mouth daily by mouth.   Misc. Devices MISC Please supply patient with tracheostomy humidifier  Z93.0   PRESCRIPTION MEDICATION 1 each, Daily at bedtime  spironolactone  (ALDACTONE ) 25 mg, Oral, Daily    Diet Orders (From admission, onward)     Start     Ordered   10/29/23 1409  Diet regular Room service appropriate? Yes with Assist; Fluid consistency: Thin  Diet effective now       Question  Answer Comment  Room service appropriate? Yes with Assist   Fluid consistency: Thin      10/29/23 1409            DVT prophylaxis: SCDs Start: 10/23/23 0600 Place TED hose Start: 10/23/23 0103 apixaban  (ELIQUIS ) tablet 5 mg   Lab Results  Component Value Date   PLT 194 10/29/2023      Code Status: Full Code  Family Communication: No family at bedside  Status is: Inpatient Remains inpatient appropriate because: Vent dependent respiratory failure   Level of care: ICU  Consultants:  PCCM Orthopedic surgery Trauma Neurosurgery  Objective: Vitals:   11/04/23 0700 11/04/23 0800 11/04/23 0850 11/04/23 0900  BP: 128/72 (!) 129/59  126/69  Pulse: (!) 54 (!) 50  (!) 52  Resp: 14 17  13   Temp:  97.6 F (36.4 C)    TempSrc:  Oral    SpO2: 98% 99% 92% 96%  Weight:      Height:        Intake/Output Summary (Last 24 hours) at 11/04/2023 1111 Last data filed at 11/03/2023 1800 Gross per 24 hour  Intake --  Output 800 ml  Net -800 ml   Wt Readings from Last 3 Encounters:  11/03/23 (!) 177.8 kg  09/11/23 (!) 178.3 kg  06/17/23 (!) 185.5 kg    Examination:  Constitutional: NAD Eyes: no scleral icterus ENMT: Mucous membranes are moist.  Neck: normal, supple Respiratory: clear to auscultation bilaterally, no wheezing, no crackles.  Cardiovascular: Regular rate and rhythm, no murmurs / rubs / gallops. No LE edema.  Abdomen: non distended, no tenderness. Bowel sounds positive.  Musculoskeletal: no clubbing / cyanosis.   Data Reviewed: I have independently reviewed following labs and imaging studies   CBC Recent Labs  Lab 10/29/23 0538  WBC 5.2  HGB 13.6  HCT 44.7  PLT 194  MCV 95.5  MCH 29.1  MCHC 30.4  RDW 15.4    No results for input(s): "NA", "K", "CL", "CO2", "GLUCOSE", "BUN", "CREATININE", "CALCIUM ", "AST", "ALT", "ALKPHOS", "BILITOT", "ALBUMIN", "MG", "CRP", "DDIMER", "PROCALCITON", "LATICACIDVEN", "INR", "TSH", "CORTISOL", "HGBA1C", "AMMONIA",  "BNP" in the last 168 hours.  Invalid input(s): "GFRCGP", "PHOSPHOROUS"  ------------------------------------------------------------------------------------------------------------------ No results for input(s): "CHOL", "HDL", "LDLCALC", "TRIG", "CHOLHDL", "LDLDIRECT" in the last 72 hours.  Lab Results  Component Value Date   HGBA1C 5.1 10/25/2023   ------------------------------------------------------------------------------------------------------------------ No results for input(s): "TSH", "T4TOTAL", "T3FREE", "THYROIDAB" in the last 72 hours.  Invalid input(s): "FREET3"  Cardiac Enzymes No results for input(s): "CKMB", "TROPONINI", "MYOGLOBIN" in the last 168 hours.  Invalid input(s): "CK" ------------------------------------------------------------------------------------------------------------------    Component Value Date/Time   BNP 9.4 10/27/2023 0512    CBG: Recent Labs  Lab 11/02/23 1536 11/02/23 1927 11/03/23 0743 11/03/23 1159 11/03/23 1629  GLUCAP 87 97 90 88 77    No results found for this or any previous visit (from the past 240 hours).   Radiology Studies: No results found.   Kathlen Para, MD, PhD [facility]  Between 7 am - 7 pm I am available, please contact me via Amion (for emergencies) or Securechat (non urgent messages)  Between 7 pm - 7 am I am not available, please contact night  coverage MD/APP via Amion

## 2023-11-04 NOTE — Progress Notes (Signed)
 Occupational Therapy Treatment Patient Details Name: Angel Brewer MRN: [id number] DOB: [birth year] Today's Date: 11/04/2023   History of present illness Pt is a 58 y.o. female presenting from home after a fall 4/10. Hypoxic and placed on NRB. Found to have acute superior endplate deformity of L1, R clavicular head fx, L displaced tibial plateau intercondylar fx. CTH with no acute findings, fx of L proximal humerus.  PMH significant for chronic hypoxic hypercarbic respiratory failure tracheostomy dependent since May 2023, HFpEF, OSA, PE and DVT on Eliquis , and morbid obesity (BMI 67)   OT comments  Pt progressing toward established OT goals. Progressing to +1 assist with bed mobility today. Pt soiled on arrival and requires total A for clean-up but reports she uses AE at home. Challenging activity tolerance, balance, and mobility with functional ambulation, grooming, and various reaching tasks within hall. Encouraged pt to engage in reaching with RUE as well within what her pain allows. Re-introduced AE which pt reports she uses at home in lieu of potential need for compensatory techniques for RUE, but pt able to use without difficulty. Pt remains an excellent, motivated candidate for inpatient rehab >3 hours/day.       If plan is discharge home, recommend the following:  Two people to help with walking and/or transfers;Two people to help with bathing/dressing/bathroom;Assistance with feeding;Assistance with cooking/housework;Assist for transportation;Help with stairs or ramp for entrance   Equipment Recommendations  BSC/3in1 (bari)    Recommendations for Other Services Rehab consult    Precautions / Restrictions Precautions Precautions: Fall Recall of Precautions/Restrictions: Intact Precaution/Restrictions Comments: watch SpO2, on trach collar Restrictions Weight Bearing Restrictions Per Provider Order: Yes RUE Weight Bearing Per Provider Order: Non weight bearing (ok to WB  thorugh RUE with RW if tolerated) LUE Weight Bearing Per Provider Order: Weight bearing as tolerated LLE Weight Bearing Per Provider Order: Weight bearing as tolerated       Mobility Bed Mobility Overal bed mobility: Needs Assistance Bed Mobility: Supine to Sit     Supine to sit: Mod assist, HOB elevated, Used rails, +2 for safety/equipment     General bed mobility comments: Extra time and modA needed at L side of her trunk to ascend and sit up L EOB using L bed rail with L UE. Bed pad utilized to scoot pt's R hip to edge of bed.    Transfers Overall transfer level: Needs assistance Equipment used: Rolling walker (2 wheels) Transfers: Sit to/from Stand Sit to Stand: Min assist, +2 safety/equipment, From elevated surface           General transfer comment: EOB elevated per pt request. Bed rail up for pt to push on with L UE to power up to stand. MinA, +2 for safety, needed to extend hips and stand up from elevated EOB     Balance Overall balance assessment: Needs assistance Sitting-balance support: Single extremity supported, No upper extremity supported, Feet supported Sitting balance-[name]: Fair Sitting balance - Comments: Pt relies on her L UE initially but once her L lean is reduced she is able to sit statically without UE support, CGA for safety Postural control: Left lateral lean Standing balance support: During functional activity, Bilateral upper extremity supported, No upper extremity supported, Single extremity supported Standing balance-[name]: Fair Standing balance comment: Able to stand and reach min off COG without UE support and no LOB, CGA for safety. Pt prefers 1 UE support for further reaching, CGA for safety, extra time to reach off COG further as pt  is cautious. Uses RW to ambulate                           ADL either performed or assessed with clinical judgement   ADL Overall ADL's : Needs assistance/impaired     Grooming:  Contact guard assist;Standing;Oral care Grooming Details (indicate cue type and reason): use of compensatory techniques in light of discomfort with RUE ROM             Lower Body Dressing: Set up;With adaptive equipment;Sitting/lateral leans Lower Body Dressing Details (indicate cue type and reason): doffed/donned socks with AE; pt reports she uses sock aid and shoe horn at home. Uses shoe horn for shoes and taking off socks. Toilet Transfer: Personal assistant Details (indicate cue type and reason): simulated Toileting- Clothing Manipulation and Hygiene: Total assistance;Sit to/from stand Toileting - Clothing Manipulation Details (indicate cue type and reason): soiled on arrival; RN providing posterior and anterior pericare whil pt standing. pt reports she has things to assist at home, but could not clarify if AE or something else     Functional mobility during ADLs: Minimal assistance;Rolling walker (2 wheels)      Extremity/Trunk Assessment Upper Extremity Assessment Upper Extremity Assessment: Right hand dominant;RUE deficits/detail RUE Deficits / Details: using on RW and as tolerated; limited shoulder movement due to pain from clavicle fracture; encouraged ROM of elbow/wrist/hand RUE Coordination: decreased gross motor   Lower Extremity Assessment Lower Extremity Assessment: Defer to PT evaluation        Vision       Perception     Praxis     Communication Communication Communication: No apparent difficulties Factors Affecting Communication: [name] valve   Cognition Arousal: Alert Behavior During Therapy: WFL for tasks assessed/performed Cognition: No apparent impairments                               Following commands: Intact        Cueing   Cueing Techniques: Verbal cues, Visual cues  Exercises Exercises: Other exercises Other Exercises Other Exercises: dynamic reaching off COG with 1 - no UE  support (alternating between ambulating then dynamic reaching with feet static and with reaching at sink), slow to reach as pt is cautious, no LOB, CGA for safety Other Exercises: bil UE (L UE AROM, R UE AAROM), reaching vertically and diagonally in standing, x3 sets of 1-4 reaches. gentle ROM of RUE    Shoulder Instructions       General Comments pleth unreliable reading in 70s% when ambulating but once pleth was reliable in standing >/= 89% on 6L 35% FiO2 while standing/ambulating    Pertinent Vitals/ Pain       Pain Assessment Pain Assessment: Faces Faces Pain Scale: Hurts Dimitri more Pain Location: R leg with sitting EOB, R UE with mobility Pain Descriptors / Indicators: Discomfort, Grimacing, Guarding Pain Intervention(s): Monitored during session, Limited activity within patient's tolerance  Home Living                                          Prior Functioning/Environment              Frequency  Min 3X/week        Progress Toward Goals  OT Goals(current goals can now  be found in the care plan section)  Progress towards OT goals: Progressing toward goals  Acute Rehab OT Goals Patient Stated Goal: get better OT Goal Formulation: With patient Time For Goal Achievement: 11/10/23 Potential to Achieve Goals: Good ADL Goals Pt Will Perform Grooming: with set-up;sitting Pt Will Perform Lower Body Dressing: with mod assist;sit to/from stand Pt Will Transfer to Toilet: with mod assist;stand pivot transfer Pt/caregiver will Perform Home Exercise Program: Increased strength;With written HEP provided;With Supervision  Plan      Co-evaluation    PT/OT/SLP Co-Evaluation/Treatment: Yes Reason for Co-Treatment: To address functional/ADL transfers;For patient/therapist safety;Other (comment) (was told pt was +3 prior to session) PT goals addressed during session: Mobility/safety with mobility;Balance;Proper use of DME;Strengthening/ROM OT goals  addressed during session: ADL's and self-care;Strengthening/ROM      AM-PAC OT "6 Clicks" Daily Activity     Outcome Measure   Help from another person eating meals?: None Help from another person taking care of personal grooming?: A Murrill Help from another person toileting, which includes using toliet, bedpan, or urinal?: Total Help from another person bathing (including washing, rinsing, drying)?: A Lot Help from another person to put on and taking off regular upper body clothing?: A Lot Help from another person to put on and taking off regular lower body clothing?: A Krygier 6 Click Score: 15    End of Session Equipment Utilized During Treatment: Gait belt;Rolling walker (2 wheels);Oxygen   OT Visit Diagnosis: Unsteadiness on feet (R26.81);Muscle weakness (generalized) (M62.81);History of falling (Z91.81);Pain Pain - Right/Left: Right Pain - part of body: Shoulder   Activity Tolerance Patient tolerated treatment well   Patient Left in chair;with call bell/phone within reach;with chair alarm set   Nurse Communication Mobility status;Weight bearing status;Precautions        Time: 0930-1010 OT Time Calculation (min): 40 min  Charges: OT General Charges $OT Visit: 1 Visit OT Treatments $Self Care/Home Management : 8-22 mins  Karilyn Ouch, OTR/L [facility] Acute Rehabilitation Office: [phone]   Emery Hans 11/04/2023, 2:53 PM

## 2023-11-04 NOTE — Progress Notes (Signed)
 Inpatient Rehab Admissions Coordinator:   Per therapy recommendations,  patient was screened for CIR candidacy by Wandalee Gust, MS CCC-SLP  At this time, Pt. is not medically ready for CIR, as she continues to require vent support at night. I will not pursue a rehab consult for this Pt. at this time, but CIR admissions team will follow and monitor for medical readiness and place consult order if Pt. appears to be an appropriate candidate. Please contact me with any questions.   Wandalee Gust, MS, CCC-SLP Rehab Admissions Coordinator  [phone] (celll) [phone] (office)

## 2023-11-04 NOTE — TOC Progression Note (Signed)
 Transition of Care [facility]) - Progression Note    Patient Details  Name: Angel Brewer MRN: [id number] Date of Birth: 09/23/1965  Transition of Care [facility]) CM/SW Contact  Crescencio Jozwiak M, RN Phone Number: 11/04/2023, 5:30 PM  Clinical Narrative:    Met with patient to discuss discharge planning:  patient will need Trilogy vent for home, and TOC Supervisor has approved a 30 day LOG for Trilogy, with possibility of renewal.  Now that she has received her disability, she only has [facility], which will not cover Trilogy vent or hospitalization.  Her Case Manager at [facility] has referred her to the [employer], but uncertain if the plan she ends up with would cover a home vent. I discussed all this with patient and she is quite frustrated with the situation.  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   HOWEVER, at this point we cannot take a chance on discharging her with a LOG Trilogy without knowing what her insurance will be,and if it will cover vent.  If she goes home with Trilogy, and ends up with a [employer] that will not cover it, she will end up right back in the hospital. Will attempt to follow up with Case Mgr at [facility] to see when we may know what insurance is available to her.  Will follow with updates.    Expected Discharge Plan: IP Rehab Facility                                                Social Determinants of Health (SDOH) Interventions SDOH Screenings   Food Insecurity: No Food Insecurity (10/26/2023)  Housing: Low Risk  (10/26/2023)  Transportation Needs: No Transportation Needs (10/26/2023)  Utilities: Not At Risk (10/26/2023)  Depression (PHQ2-9): Low Risk  (06/17/2023)  Financial Resource Strain: Medium Risk (06/16/2023)  Physical Activity: Sufficiently Active (06/16/2023)  Social Connections: Socially Isolated (06/16/2023)   Stress: No Stress Concern Present (06/16/2023)  Tobacco Use: Medium Risk (10/23/2023)  Health Literacy: Adequate Health Literacy (06/17/2023)    Readmission Risk Interventions    09/03/2023    1:17 PM 12/20/2021   11:33 AM  Readmission Risk Prevention Plan  Post Dischage Appt Complete   Medication Screening Complete   Transportation Screening Complete Complete  PCP or Specialist Appt within 3-5 Days  Complete  HRI or Home Care Consult  Complete  Social Work Consult for Recovery Care Planning/Counseling  Complete  Palliative Care Screening  Not Applicable  Medication Review (RN Care Manager)  Complete   Calla Catchings, RN, BSN  Trauma/Neuro ICU Case Manager [phone]

## 2023-11-04 NOTE — Progress Notes (Signed)
   11/04/23 0850  Oxygen  Therapy/Pulse Ox  O2 Device (S)  Tracheostomy Collar  O2 Therapy Oxygen  humidified  O2 Flow Rate (L/min) 10 L/min  FiO2 (%) 35 %  SpO2 92 %   Pt is tolerating well at this time.

## 2023-11-04 NOTE — Progress Notes (Signed)
 Physical Therapy Treatment Patient Details Name: Angel Brewer MRN: [id number] DOB: 1965/10/20 Today's Date: 11/04/2023   History of Present Illness Pt is a 58 y.o. female presenting from home after a fall 4/10. Hypoxic and placed on NRB. Found to have acute superior endplate deformity of L1, R clavicular head fx, L displaced tibial plateau intercondylar fx. CTH with no acute findings, fx of L proximal humerus.  PMH significant for chronic hypoxic hypercarbic respiratory failure tracheostomy dependent since May 2023, HFpEF, OSA, PE and DVT on Eliquis , and morbid obesity (BMI 67)    PT Comments  The pt is demonstrating good progress towards her functional mobility goals, only needing minA to transfer to stand from elevated EOB and CGA to ambulate with a RW this date, +2 for safety with all. She continues to demonstrate difficulty particularly with bed mobility due to her R UE pain. Focused part of her session on challenging her balance through turning her head while ambulating and by reaching off COG to reach targets with 1-no UE support. She did not display a LOB but does reach very slowly and cautiously, suggesting continued balance deficits. Attempted to encourage light ROM to her R UE to reduce risk of frozen shoulder. Will continue to follow acutely.     If plan is discharge home, recommend the following: Assistance with cooking/housework;Assist for transportation;Help with stairs or ramp for entrance;A lot of help with walking and/or transfers;A lot of help with bathing/dressing/bathroom   Can travel by private vehicle        Equipment Recommendations  Hospital bed (bariatric)    Recommendations for Other Services       Precautions / Restrictions Precautions Precautions: Fall Recall of Precautions/Restrictions: Intact Precaution/Restrictions Comments: watch SpO2, on trach collar Restrictions Weight Bearing Restrictions Per Provider Order: Yes RUE Weight Bearing Per Provider  Order: Non weight bearing (ok to WB thorugh RUE with RW if tolerated) LUE Weight Bearing Per Provider Order: Weight bearing as tolerated LLE Weight Bearing Per Provider Order: Weight bearing as tolerated     Mobility  Bed Mobility Overal bed mobility: Needs Assistance Bed Mobility: Supine to Sit     Supine to sit: Mod assist, HOB elevated, Used rails, +2 for safety/equipment     General bed mobility comments: Extra time and modA needed at L side of her trunk to ascend and sit up L EOB using L bed rail with L UE. Bed pad utilized to scoot pt's R hip to edge of bed.    Transfers Overall transfer level: Needs assistance Equipment used: Rolling walker (2 wheels) Transfers: Sit to/from Stand Sit to Stand: Min assist, +2 safety/equipment, From elevated surface           General transfer comment: EOB elevated per pt request. Bed rail up for pt to push on with L UE to power up to stand. MinA, +2 for safety, needed to extend hips and stand up from elevated EOB    Ambulation/Gait Ambulation/Gait assistance: +2 safety/equipment, Contact guard assist Gait Distance (Feet): 160 Feet Assistive device: Rolling walker (2 wheels) Gait Pattern/deviations: Decreased stride length, Step-through pattern Gait velocity: decreased Gait velocity interpretation: <1.31 ft/sec, indicative of household ambulator   General Gait Details: Cues provided for pt to challenge her vestibular system while ambulating and her balance through playing "eye spy" then getting proximal to objects and reaching off COG to touch them. Pt often had to stop ambulating and keep 1 hand on RW to reach off COG. CGA for safety, +2 for  safety, no LOB   Stairs             Wheelchair Mobility     Tilt Bed    Modified Rankin (Stroke Patients Only)       Balance Overall balance assessment: Needs assistance Sitting-balance support: Single extremity supported, No upper extremity supported, Feet supported Sitting  balance-[name]: Fair Sitting balance - Comments: Pt relies on her L UE initially but once her L lean is reduced she is able to sit statically without UE support, CGA for safety Postural control: Left lateral lean Standing balance support: During functional activity, Bilateral upper extremity supported, No upper extremity supported, Single extremity supported Standing balance-[name]: Fair Standing balance comment: Able to stand and reach min off COG without UE support and no LOB, CGA for safety. Pt prefers 1 UE support for further reaching, CGA for safety, extra time to reach off COG further as pt is cautious. Uses RW to ambulate                            Communication Communication Communication: No apparent difficulties Factors Affecting Communication: [name] valve  Cognition Arousal: Alert Behavior During Therapy: WFL for tasks assessed/performed   PT - Cognitive impairments: Awareness, Problem solving, Safety/Judgement                       PT - Cognition Comments: pt with poor communication of needs and particular about how to manage equipment, able to discern needs through continued communication. Following commands: Intact      Cueing Cueing Techniques: Verbal cues, Visual cues  Exercises Other Exercises Other Exercises: dynamic reaching off COG with 1 - no UE support (alternating between ambulating then dynamic reaching with feet static and with reaching at sink), slow to reach as pt is cautious, no LOB, CGA for safety Other Exercises: bil UE (L UE AROM, R UE AAROM), reaching vertically and diagonally in standing, x3 sets of 1-4 reaches    General Comments General comments (skin integrity, edema, etc.): pleth unreliable reading in 70s% when ambulating but once pleth was reliable in standing >/= 89% on 6L 35% FiO2 while standing/ambulating      Pertinent Vitals/Pain Pain Assessment Pain Assessment: Faces Faces Pain Scale: Hurts Gambale  more Pain Location: R leg with sitting EOB, R UE with mobility Pain Descriptors / Indicators: Discomfort, Grimacing, Guarding Pain Intervention(s): Monitored during session, Limited activity within patient's tolerance, Repositioned    Home Living                          Prior Function            PT Goals (current goals can now be found in the care plan section) Acute Rehab PT Goals Patient Stated Goal: return to independence PT Goal Formulation: With patient Time For Goal Achievement: 11/10/23 Potential to Achieve Goals: Good Progress towards PT goals: Progressing toward goals    Frequency    Min 2X/week      PT Plan      Co-evaluation PT/OT/SLP Co-Evaluation/Treatment: Yes Reason for Co-Treatment: To address functional/ADL transfers;For patient/therapist safety;Other (comment) (was told pt was +3 prior to session) PT goals addressed during session: Mobility/safety with mobility;Balance;Proper use of DME;Strengthening/ROM        AM-PAC PT "6 Clicks" Mobility   Outcome Measure  Help needed turning from your back to your side while in  a flat bed without using bedrails?: A Lot Help needed moving from lying on your back to sitting on the side of a flat bed without using bedrails?: A Lot Help needed moving to and from a bed to a chair (including a wheelchair)?: A Cerda Help needed standing up from a chair using your arms (e.g., wheelchair or bedside chair)?: A Piotrowski Help needed to walk in hospital room?: A Cecena Help needed climbing 3-5 steps with a railing? : Total 6 Click Score: 14    End of Session Equipment Utilized During Treatment: Gait belt;Oxygen  Activity Tolerance: Patient tolerated treatment well;Patient limited by fatigue Patient left: with call bell/phone within reach;in chair Nurse Communication: Mobility status;Other (comment) (no chair alarm) PT Visit Diagnosis: Unsteadiness on feet (R26.81);Other abnormalities of gait and mobility  (R26.89);Repeated falls (R29.6);Muscle weakness (generalized) (M62.81);Pain Pain - Right/Left: Right Pain - part of body: Shoulder     Time: 0981-1914 PT Time Calculation (min) (ACUTE ONLY): 42 min  Charges:    $Gait Training: 8-22 mins $Therapeutic Exercise: 8-22 mins PT General Charges $$ ACUTE PT VISIT: 1 Visit                     Vernida Goodie, PT, DPT Acute Rehabilitation Services  Office: [phone]    Ellyn Hack 11/04/2023, 11:40 AM

## 2023-11-04 NOTE — Plan of Care (Signed)
  Problem: Health Behavior/Discharge Planning: Goal: Ability to manage health-related needs will improve Outcome: Progressing   Problem: Nutritional: Goal: Maintenance of adequate nutrition will improve Outcome: Progressing   Problem: Skin Integrity: Goal: Risk for impaired skin integrity will decrease Outcome: Progressing   Problem: Tissue Perfusion: Goal: Adequacy of tissue perfusion will improve Outcome: Progressing   Problem: Clinical Measurements: Goal: Diagnostic test results will improve Outcome: Progressing Goal: Respiratory complications will improve Outcome: Progressing   Problem: Activity: Goal: Risk for activity intolerance will decrease Outcome: Progressing   Problem: Nutrition: Goal: Adequate nutrition will be maintained Outcome: Progressing   Problem: Coping: Goal: Level of anxiety will decrease Outcome: Progressing

## 2023-11-05 LAB — CBC
HCT: 43 % (ref 36.0–46.0)
Hemoglobin: 13.3 g/dL (ref 12.0–15.0)
MCH: 29.3 pg (ref 26.0–34.0)
MCHC: 30.9 g/dL (ref 30.0–36.0)
MCV: 94.7 fL (ref 80.0–100.0)
Platelets: 204 10*3/uL (ref 150–400)
RBC: 4.54 MIL/uL (ref 3.87–5.11)
RDW: 14.8 % (ref 11.5–15.5)
WBC: 4.3 10*3/uL (ref 4.0–10.5)
nRBC: 0 % (ref 0.0–0.2)

## 2023-11-05 LAB — COMPREHENSIVE METABOLIC PANEL WITH GFR
ALT: 11 U/L (ref 0–44)
AST: 13 U/L — ABNORMAL LOW (ref 15–41)
Albumin: 3.2 g/dL — ABNORMAL LOW (ref 3.5–5.0)
Alkaline Phosphatase: 48 U/L (ref 38–126)
Anion gap: 9 (ref 5–15)
BUN: 16 mg/dL (ref 6–20)
CO2: 38 mmol/L — ABNORMAL HIGH (ref 22–32)
Calcium: 9.5 mg/dL (ref 8.9–10.3)
Chloride: 90 mmol/L — ABNORMAL LOW (ref 98–111)
Creatinine, Ser: 0.9 mg/dL (ref 0.44–1.00)
GFR, Estimated: >60 mL/min (ref >60)
Glucose, Bld: 100 mg/dL — ABNORMAL HIGH (ref 70–99)
Potassium: 3.6 mmol/L (ref 3.5–5.1)
Sodium: 137 mmol/L (ref 135–145)
Total Bilirubin: 1.2 mg/dL (ref 0.0–1.2)
Total Protein: 7.4 g/dL (ref 6.5–8.1)

## 2023-11-05 LAB — MAGNESIUM: Magnesium: 2.1 mg/dL (ref 1.7–2.4)

## 2023-11-05 LAB — PHOSPHORUS: Phosphorus: 4.5 mg/dL (ref 2.5–4.6)

## 2023-11-05 NOTE — Progress Notes (Signed)
 PROGRESS NOTE  Angel Brewer QMV:[id number] DOB: 03/25/1966 DOA: 10/22/2023 PCP: [name], NP   LOS: 13 days   Brief Narrative / Interim history: 58 year old female with chronic hypercarbic respiratory failure, trach dependent since May 2023, chronic diastolic CHF, OSA on CPAP at bedtime, PE and DVT on Eliquis , comes into the hospital with worsening respiratory status after mechanical fall with multiple fractures (right clavicle, left tibial plateau, L1 vertebral fractures).  Critical care consulted, found to be vent dependent at nighttime.  Plans are in place to obtain a trilogy for her, however there may be some financial/insurance barriers.  Subjective / 24h Interval events: Doing well, no shortness of breath.  Complains of constipation  Assesement and Plan: Principal Problem:   Multiple fracture Active Problems:   Chronic hypoxic respiratory failure (HCC)   Acute metabolic encephalopathy   History of pulmonary embolism   Benign essential HTN   Acute hypoxemic respiratory failure (HCC)   History of CHF (congestive heart failure)   OSA on CPAP   History of DVT (deep vein thrombosis)   Fall at home, initial encounter   Acute respiratory failure with hypoxia and hypercarbia (HCC)   Tracheostomy dependence (HCC)   Fall   Principal problem Acute on chronic hypoxic and hypercarbic respiratory failure, chronic tracheal stenosis status, chronic OSA/obesity hypoventilation syndrome -PCCM consulted, appreciate input.  Currently seems to be requiring vent support overnight, and she is stable during the day. - Per PCCM, it may be reasonable to trial her off nocturnal ventilation periodically to see if she still needs it.  Appreciate PCCM follow-up - TOC working to see if trilogy can be obtained to be used at bedtime/while sleeping.  For now while needing vent cannot be discharged  Active problems Mechanical fall, multiple fractures -including right clavicle, left tibial  plateau, L1 vertebral fractures.  Trauma, neurosurgery, orthopedic surgery saw and evaluated patient, for now continue conservative management.  She is to wear sling for comfort, TLSO brace of [place] value due to body habitus.  Weightbearing as tolerated.  Continue to work with physical therapy, current recommendations are for CIR but apparently she is not a candidate  History of recurrent PE/DVT-continue Eliquis   Chronic diastolic CHF-continue home furosemide , appears euvolemic  Essential hypertension-continue medications as below, blood pressure acceptable  Hyperlipidemia-continue statin  Obesity, morbid-BMI 62.5  Scheduled Meds:  amLODipine   5 mg Oral Daily   apixaban   5 mg Oral BID   atorvastatin   20 mg Oral Daily   Chlorhexidine  Gluconate Cloth  6 each Topical Daily   furosemide   20 mg Oral Daily   mouth rinse  15 mL Mouth Rinse 4 times per day   pantoprazole   40 mg Oral QHS   sodium chloride  flush  3 mL Intravenous Q12H   Continuous Infusions: PRN Meds:.acetaminophen  (TYLENOL ) oral liquid 160 mg/5 mL, diclofenac  Sodium, docusate sodium , hydrALAZINE , ipratropium-albuterol , naLOXone  (NARCAN )  injection, ondansetron  **OR** ondansetron  (ZOFRAN ) IV, mouth rinse, polyethylene glycol, sodium chloride  flush  Current Outpatient Medications  Medication Instructions   amLODipine  (NORVASC ) 5 mg, Oral, Daily   apixaban  (ELIQUIS ) 5 mg, Oral, 2 times daily   atorvastatin  (LIPITOR) 20 mg, Oral, Daily   dapagliflozin  propanediol (FARXIGA ) 10 mg, Oral, Daily   furosemide  (LASIX ) 20 mg, Oral, Daily, Take 40 mg or 2 tablets for the next 5 days then resume 20 mg or 1 tablet by mouth daily by mouth.   Misc. Devices MISC Please supply patient with tracheostomy humidifier  Z93.0   PRESCRIPTION MEDICATION 1 each,  Daily at bedtime   spironolactone  (ALDACTONE ) 25 mg, Oral, Daily    Diet Orders (From admission, onward)     Start     Ordered   10/29/23 1409  Diet regular Room service appropriate?  Yes with Assist; Fluid consistency: Thin  Diet effective now       Question Answer Comment  Room service appropriate? Yes with Assist   Fluid consistency: Thin      10/29/23 1409            DVT prophylaxis: SCDs Start: 10/23/23 0600 Place TED hose Start: 10/23/23 0103 apixaban  (ELIQUIS ) tablet 5 mg   Lab Results  Component Value Date   PLT 204 11/05/2023      Code Status: Full Code  Family Communication: No family at bedside  Status is: Inpatient Remains inpatient appropriate because: Vent dependent respiratory failure   Level of care: ICU  Consultants:  PCCM Orthopedic surgery Trauma Neurosurgery  Objective: Vitals:   11/05/23 0633 11/05/23 0802 11/05/23 0900 11/05/23 1000  BP:  139/69 133/76 (!) 124/96  Pulse:  (!) 55 (!) 52 (!) 50  Resp:  14 15 16   Temp:  98.1 F (36.7 C)    TempSrc:  Oral    SpO2: 97% 96% 97% 96%  Weight:      Height:        Intake/Output Summary (Last 24 hours) at 11/05/2023 1124 Last data filed at 11/05/2023 0800 Gross per 24 hour  Intake 240 ml  Output 300 ml  Net -60 ml   Wt Readings from Last 3 Encounters:  11/05/23 (!) 175.7 kg  09/11/23 (!) 178.3 kg  06/17/23 (!) 185.5 kg    Examination:  Constitutional: NAD Eyes: lids and conjunctivae normal, no scleral icterus ENMT: mmm Neck: normal, supple Respiratory: clear to auscultation bilaterally, no wheezing, no crackles.  Cardiovascular: Regular rate and rhythm, no murmurs / rubs / gallops. No LE edema. Abdomen: soft, no distention, no tenderness. Bowel sounds positive.   Data Reviewed: I have independently reviewed following labs and imaging studies   CBC Recent Labs  Lab 11/05/23 0638  WBC 4.3  HGB 13.3  HCT 43.0  PLT 204  MCV 94.7  MCH 29.3  MCHC 30.9  RDW 14.8    Recent Labs  Lab 11/05/23 0638  NA 137  K 3.6  CL 90*  CO2 38*  GLUCOSE 100*  BUN 16  CREATININE 0.90  CALCIUM  9.5  AST 13*  ALT 11  ALKPHOS 48  BILITOT 1.2  ALBUMIN 3.2*  MG  2.1    ------------------------------------------------------------------------------------------------------------------ No results for input(s): "CHOL", "HDL", "LDLCALC", "TRIG", "CHOLHDL", "LDLDIRECT" in the last 72 hours.  Lab Results  Component Value Date   HGBA1C 5.1 10/25/2023   ------------------------------------------------------------------------------------------------------------------ No results for input(s): "TSH", "T4TOTAL", "T3FREE", "THYROIDAB" in the last 72 hours.  Invalid input(s): "FREET3"  Cardiac Enzymes No results for input(s): "CKMB", "TROPONINI", "MYOGLOBIN" in the last 168 hours.  Invalid input(s): "CK" ------------------------------------------------------------------------------------------------------------------    Component Value Date/Time   BNP 9.4 10/27/2023 0512    CBG: Recent Labs  Lab 11/03/23 0743 11/03/23 1159 11/03/23 1629 11/04/23 1218 11/04/23 1530  GLUCAP 90 88 77 78 110*    No results found for this or any previous visit (from the past 240 hours).   Radiology Studies: No results found.   Kathlen Para, MD, PhD [facility]  Between 7 am - 7 pm I am available, please contact me via Amion (for emergencies) or Securechat (non urgent messages)  Between  7 pm - 7 am I am not available, please contact night coverage MD/APP via Amion

## 2023-11-05 NOTE — Plan of Care (Signed)
  Problem: Nutritional: Goal: Maintenance of adequate nutrition will improve Outcome: Progressing   Problem: Skin Integrity: Goal: Risk for impaired skin integrity will decrease Outcome: Progressing   Problem: Tissue Perfusion: Goal: Adequacy of tissue perfusion will improve Outcome: Progressing   Problem: Coping: Goal: Level of anxiety will decrease Outcome: Progressing   Problem: Elimination: Goal: Will not experience complications related to bowel motility Outcome: Progressing Goal: Will not experience complications related to urinary retention Outcome: Progressing   Problem: Pain Managment: Goal: General experience of comfort will improve and/or be controlled Outcome: Progressing   Problem: Safety: Goal: Ability to remain free from injury will improve Outcome: Progressing   Problem: Skin Integrity: Goal: Risk for impaired skin integrity will decrease Outcome: Progressing

## 2023-11-05 NOTE — Progress Notes (Signed)
 Inpatient Rehabilitation Admissions Coordinator   She has progressed well and may progress to direct discharge home if she has family support ( niece to stay with her) and Trilogy set up. I will not place Rehab consult at this time.  Jeannetta Millman, RN, MSN Rehab Admissions Coordinator [phone] 11/05/2023 5:44 PM

## 2023-11-05 NOTE — Progress Notes (Signed)
 Physical Therapy Treatment Patient Details Name: Angel Brewer MRN: [id number] DOB: [birth year] Today's Date: 11/05/2023   History of Present Illness Pt is a 58 y.o. female presenting from home after a fall 4/10. Hypoxic and placed on NRB. Found to have acute superior endplate deformity of L1, R clavicular head fx, L displaced tibial plateau intercondylar fx. CTH with no acute findings, fx of L proximal humerus.  PMH significant for chronic hypoxic hypercarbic respiratory failure tracheostomy dependent since May 2023, HFpEF, OSA, PE and DVT on Eliquis , and morbid obesity (BMI 67)    PT Comments  The pt is making great functional progress, now ambulating without an AD. She was able to do so without LOB and with only CGA for safety, even when suddenly stopping and changing head positions to challenge her dynamic gait balance. She continues to require minA to transfer to stand though due to pain and lower extremity weakness. Directed pt through a couple standing exercises and educated her on exercises to strengthen her legs. Will continue to follow acutely.     If plan is discharge home, recommend the following: Assistance with cooking/housework;Assist for transportation;Help with stairs or ramp for entrance;A [name] help with walking and/or transfers;A [name] help with bathing/dressing/bathroom   Can travel by private vehicle        Equipment Recommendations  Hospital bed (bariatric)    Recommendations for Other Services       Precautions / Restrictions Precautions Precautions: Fall Recall of Precautions/Restrictions: Intact Precaution/Restrictions Comments: watch SpO2, on trach collar Restrictions Weight Bearing Restrictions Per Provider Order: Yes RUE Weight Bearing Per Provider Order: Non weight bearing (ok to WB through RUE with RW if tolerated) LUE Weight Bearing Per Provider Order: Weight bearing as tolerated LLE Weight Bearing Per Provider Order: Weight bearing as  tolerated     Mobility  Bed Mobility               General bed mobility comments: Pt OOB in recliner upon arrival and at end of session    Transfers Overall transfer level: Needs assistance Equipment used: Rolling walker (2 wheels), 1 person hand held assist (bari) Transfers: Sit to/from Stand Sit to Stand: Min assist           General transfer comment: Pt requesting therapist be on her L in order to grab therapist's arm with her L UE to pull up to stand after rocking and counting to 3, minA needed to power up to stand from recliner    Ambulation/Gait Ambulation/Gait assistance: Contact guard assist Gait Distance (Feet): 340 Feet Assistive device: Rolling walker (2 wheels), None Gait Pattern/deviations: Decreased stride length, Step-through pattern, Wide base of support Gait velocity: decreased Gait velocity interpretation: <1.8 ft/sec, indicate of risk for recurrent falls   General Gait Details: Pt initially ambulated with RW but then progressed to no AD once in the hall. No LOB noted, CGA for safety. Pt ambulates slowly (reportedly her baseline speed) and is able to suddenly stop and change head positions without LOB. Wide BOS likely due to body habitus.   Stairs             Wheelchair Mobility     Tilt Bed    Modified Rankin (Stroke Patients Only)       Balance Overall balance assessment: Needs assistance Sitting-balance support: No upper extremity supported, Feet supported Sitting balance-[name]: Fair     Standing balance support: During functional activity, Bilateral upper extremity supported, No upper extremity supported Standing balance-[name]:  Fair Standing balance comment: Able to ambulate without UE support without LOB, even while challenged via sudden stops and head position changes                            Communication Communication Communication: No apparent difficulties Factors Affecting Communication: [place]  - Muir valve  Cognition Arousal: Alert Behavior During Therapy: WFL for tasks assessed/performed   PT - Cognitive impairments: Awareness, Problem solving, Safety/Judgement                       PT - Cognition Comments: pt with poor communication of needs and particular about how to manage equipment, able to discern needs through continued communication. Following commands: Intact      Cueing Cueing Techniques: Verbal cues  Exercises General Exercises - Lower Extremity Hip ABduction/ADduction: AROM, Strengthening, Both, 10 reps, Standing (with intermittent UE support on RW) Other Exercises Other Exercises: mini standing squats, >15 reps bil, with intermittent UE support on RW    General Comments General comments (skin integrity, edema, etc.): pleth unreliable, pt asymptomatic throughout on trach collar 6L 35% FiO2; educated pt on performing knee flexion/extension AROM exercises and serial sit <> stands to improve strength in legs      Pertinent Vitals/Pain Pain Assessment Pain Assessment: Faces Faces Pain Scale: Hurts Markarian more Pain Location: R shoulder, bil knees Pain Descriptors / Indicators: Discomfort, Grimacing, Guarding Pain Intervention(s): Monitored during session, Limited activity within patient's tolerance, Repositioned    Home Living                          Prior Function            PT Goals (current goals can now be found in the care plan section) Acute Rehab PT Goals Patient Stated Goal: return to independence PT Goal Formulation: With patient Time For Goal Achievement: 11/10/23 Potential to Achieve Goals: Good Progress towards PT goals: Progressing toward goals    Frequency    Min 2X/week      PT Plan      Co-evaluation              AM-PAC PT "6 Clicks" Mobility   Outcome Measure  Help needed turning from your back to your side while in a flat bed without using bedrails?: A Lot Help needed moving from lying on your  back to sitting on the side of a flat bed without using bedrails?: A Lot Help needed moving to and from a bed to a chair (including a wheelchair)?: [name] Help needed standing up from a chair using your arms (e.g., wheelchair or bedside chair)?: [name] Help needed to walk in hospital room?: [name] Help needed climbing 3-5 steps with a railing? : Total 6 Click Score: 14    End of Session Equipment Utilized During Treatment: Gait belt;Oxygen  Activity Tolerance: Patient tolerated treatment well Patient left: with call bell/phone within reach;in chair Nurse Communication: Mobility status PT Visit Diagnosis: Unsteadiness on feet (R26.81);Other abnormalities of gait and mobility (R26.89);Repeated falls (R29.6);Muscle weakness (generalized) (M62.81);Pain Pain - Right/Left: Right Pain - part of body: Shoulder     Time: 4098-1191 PT Time Calculation (min) (ACUTE ONLY): 20 min  Charges:    $Gait Training: 8-22 mins PT General Charges $$ ACUTE PT VISIT: 1 Visit  Vernida Goodie, PT, DPT Acute Rehabilitation Services  Office: [phone]    Ellyn Hack 11/05/2023, 4:59 PM

## 2023-11-05 NOTE — Progress Notes (Signed)
   11/05/23 0802  Therapy Vitals  Pulse Rate (!) 55  Resp 14  BP 139/69  MEWS Score/Color  MEWS Score 0  MEWS Score Color Green  Respiratory Assessment  Assessment Type Assess only  Respiratory Pattern Regular;Unlabored  Chest Assessment Chest expansion symmetrical  Cough Non-productive  Sputum Amount None  Bilateral Breath Sounds Clear  Oxygen  Therapy/Pulse Ox  O2 Device Tracheostomy Collar  O2 Therapy Oxygen  humidified  FiO2 (%) 35 %  SpO2 96 %  Tracheostomy Shiley Flexible 6 mm Cuffed  Placement Date/Time: 10/25/23 0745   Placed By: (c) Self  Brand: Shiley Flexible  Size (mm): 6 mm  Style: Cuffed  Status Passy Muir Speaking valve  Site Assessment Clean;Dry  Ties Assessment Clean, Dry  Cuff Pressure (cm H2O)  (deflated)  Tracheostomy Equipment at bedside Yes and checklist posted at head of bed   Patient currently on aerosol trach collar, tolerating well.

## 2023-11-06 MED ORDER — POTASSIUM CHLORIDE CRYS ER 20 MEQ PO TBCR
40.0000 meq | EXTENDED_RELEASE_TABLET | Freq: Once | ORAL | Status: DC
  Filled 2023-11-06: qty 2

## 2023-11-06 NOTE — Progress Notes (Signed)
 PROGRESS NOTE  Angel Brewer ZOX:[id number] DOB: 09-18-1965 DOA: 10/22/2023 PCP: [name], NP   LOS: 14 days   Brief Narrative / Interim history: 58 year old female with chronic hypercarbic respiratory failure, trach dependent since May 2023, chronic diastolic CHF, OSA on CPAP at bedtime, PE and DVT on Eliquis , comes into the hospital with worsening respiratory status after mechanical fall with multiple fractures (right clavicle, left tibial plateau, L1 vertebral fractures).  Critical care consulted, found to be vent dependent at nighttime.  Plans are in place to obtain a trilogy for her, however there may be some financial/insurance barriers.  Subjective / 24h Interval events: She is feeling well, spent the night of the event and did well.  Alert and oriented x 4 this morning  Assesement and Plan: Principal Problem:   Multiple fracture Active Problems:   Chronic hypoxic respiratory failure (HCC)   Acute metabolic encephalopathy   History of pulmonary embolism   Benign essential HTN   Acute hypoxemic respiratory failure (HCC)   History of CHF (congestive heart failure)   OSA on CPAP   History of DVT (deep vein thrombosis)   Fall at home, initial encounter   Acute respiratory failure with hypoxia and hypercarbia (HCC)   Tracheostomy dependence (HCC)   Fall   Principal problem Acute on chronic hypoxic and hypercarbic respiratory failure, chronic tracheal stenosis status, chronic OSA/obesity hypoventilation syndrome -PCCM consulted, appreciate input.  Currently seems to be requiring vent support overnight, and she is stable during the day. - Per PCCM, it may be reasonable to trial her off nocturnal ventilation periodically to see if she still needs it.  Appreciate PCCM follow-up.  She was trialed last night, did well.  Will see if we can trial again - TOC working to see if trilogy can be obtained to be used at bedtime/while sleeping.  For now while needing vent cannot  be discharged  Active problems Mechanical fall, multiple fractures -including right clavicle, left tibial plateau, L1 vertebral fractures.  Trauma, neurosurgery, orthopedic surgery saw and evaluated patient, for now continue conservative management.  She is to wear sling for comfort, TLSO brace of [place] value due to body habitus.  Weightbearing as tolerated.  Continue to work with physical therapy, she did well yesterday and walked 350 feet, could potentially go straight home if she continues to improve  History of recurrent PE/DVT-continue Eliquis   Chronic diastolic CHF-continue home furosemide , appears euvolemic  Essential hypertension-continue medications as below, blood pressure acceptable  Hyperlipidemia-continue statin  Obesity, morbid-BMI 62.5  Scheduled Meds:  amLODipine   5 mg Oral Daily   apixaban   5 mg Oral BID   atorvastatin   20 mg Oral Daily   Chlorhexidine  Gluconate Cloth  6 each Topical Daily   furosemide   20 mg Oral Daily   mouth rinse  15 mL Mouth Rinse 4 times per day   pantoprazole   40 mg Oral QHS   sodium chloride  flush  3 mL Intravenous Q12H   Continuous Infusions: PRN Meds:.acetaminophen  (TYLENOL ) oral liquid 160 mg/5 mL, diclofenac  Sodium, docusate sodium , hydrALAZINE , ipratropium-albuterol , naLOXone  (NARCAN )  injection, ondansetron  **OR** ondansetron  (ZOFRAN ) IV, mouth rinse, polyethylene glycol, sodium chloride  flush  Current Outpatient Medications  Medication Instructions   amLODipine  (NORVASC ) 5 mg, Oral, Daily   apixaban  (ELIQUIS ) 5 mg, Oral, 2 times daily   atorvastatin  (LIPITOR) 20 mg, Oral, Daily   dapagliflozin  propanediol (FARXIGA ) 10 mg, Oral, Daily   furosemide  (LASIX ) 20 mg, Oral, Daily, Take 40 mg or 2 tablets for the next  5 days then resume 20 mg or 1 tablet by mouth daily by mouth.   Misc. Devices MISC Please supply patient with tracheostomy humidifier  Z93.0   PRESCRIPTION MEDICATION 1 each, Daily at bedtime   spironolactone  (ALDACTONE ) 25  mg, Oral, Daily    Diet Orders (From admission, onward)     Start     Ordered   10/29/23 1409  Diet regular Room service appropriate? Yes with Assist; Fluid consistency: Thin  Diet effective now       Question Answer Comment  Room service appropriate? Yes with Assist   Fluid consistency: Thin      10/29/23 1409            DVT prophylaxis: SCDs Start: 10/23/23 0600 Place TED hose Start: 10/23/23 0103 apixaban  (ELIQUIS ) tablet 5 mg   Lab Results  Component Value Date   PLT 204 11/05/2023      Code Status: Full Code  Family Communication: No family at bedside  Status is: Inpatient Remains inpatient appropriate because: Vent dependent respiratory failure   Level of care: ICU  Consultants:  PCCM Orthopedic surgery Trauma Neurosurgery  Objective: Vitals:   11/06/23 0701 11/06/23 0800 11/06/23 0814 11/06/23 0900  BP:  133/67    Pulse:  (!) 54  (!) 56  Resp:  12 20 14   Temp:  97.9 F (36.6 C)    TempSrc:  Oral    SpO2:  100%  93%  Weight: (!) 175.7 kg     Height:        Intake/Output Summary (Last 24 hours) at 11/06/2023 1104 Last data filed at 11/06/2023 0800 Gross per 24 hour  Intake 480 ml  Output 400 ml  Net 80 ml   Wt Readings from Last 3 Encounters:  11/06/23 (!) 175.7 kg  09/11/23 (!) 178.3 kg  06/17/23 (!) 185.5 kg    Examination:  Constitutional: NAD Eyes: lids and conjunctivae normal, no scleral icterus ENMT: mmm Neck: normal, supple Respiratory: clear to auscultation bilaterally, no wheezing, no crackles.  Cardiovascular: Regular rate and rhythm, no murmurs / rubs / gallops. No LE edema. Abdomen: soft, no distention, no tenderness. Bowel sounds positive.   Data Reviewed: I have independently reviewed following labs and imaging studies   CBC Recent Labs  Lab 11/05/23 0638  WBC 4.3  HGB 13.3  HCT 43.0  PLT 204  MCV 94.7  MCH 29.3  MCHC 30.9  RDW 14.8    Recent Labs  Lab 11/05/23 0638  NA 137  K 3.6  CL 90*  CO2 38*   GLUCOSE 100*  BUN 16  CREATININE 0.90  CALCIUM  9.5  AST 13*  ALT 11  ALKPHOS 48  BILITOT 1.2  ALBUMIN 3.2*  MG 2.1    ------------------------------------------------------------------------------------------------------------------ No results for input(s): "CHOL", "HDL", "LDLCALC", "TRIG", "CHOLHDL", "LDLDIRECT" in the last 72 hours.  Lab Results  Component Value Date   HGBA1C 5.1 10/25/2023   ------------------------------------------------------------------------------------------------------------------ No results for input(s): "TSH", "T4TOTAL", "T3FREE", "THYROIDAB" in the last 72 hours.  Invalid input(s): "FREET3"  Cardiac Enzymes No results for input(s): "CKMB", "TROPONINI", "MYOGLOBIN" in the last 168 hours.  Invalid input(s): "CK" ------------------------------------------------------------------------------------------------------------------    Component Value Date/Time   BNP 9.4 10/27/2023 0512    CBG: Recent Labs  Lab 11/03/23 0743 11/03/23 1159 11/03/23 1629 11/04/23 1218 11/04/23 1530  GLUCAP 90 88 77 78 110*    No results found for this or any previous visit (from the past 240 hours).   Radiology Studies: No results  found.   Kathlen Para, MD, PhD [facility]  Between 7 am - 7 pm I am available, please contact me via Amion (for emergencies) or Securechat (non urgent messages)  Between 7 pm - 7 am I am not available, please contact night coverage MD/APP via Amion

## 2023-11-06 NOTE — Progress Notes (Signed)
 Occupational Therapy Treatment Patient Details Name: Angel Brewer MRN: [id number] DOB: 1965/12/26 Today's Date: 11/06/2023   History of present illness Pt is a 58 y.o. female presenting from home after a fall 4/10. Hypoxic and placed on NRB. Found to have acute superior endplate deformity of L1, R clavicular head fx, L displaced tibial plateau intercondylar fx. CTH with no acute findings, fx of L proximal humerus.  PMH significant for chronic hypoxic hypercarbic respiratory failure tracheostomy dependent since May 2023, HFpEF, OSA, PE and DVT on Eliquis , and morbid obesity (BMI 67)   OT comments  Pt up in recliner, reports frustration from not being able to stand on her own.  Worked on sit to stand from recliner, min assist to min guard pushing up with L hand (reports plan to use 3:1 when she first gets home so this is simulated toilet transfer).  Also worked on R UE ROM, encouraged table slides for self active assist ROM without increased pain and shoulder rolls.  Reviewed compensatory techniques to support R UE to increase ability to self feed without spillage and using R UE for brushing teeth.  Pt reports she will start to try this.  Will follow acutely.       If plan is discharge home, recommend the following:  Two people to help with walking and/or transfers;Two people to help with bathing/dressing/bathroom;Assistance with feeding;Assistance with cooking/housework;Assist for transportation;Help with stairs or ramp for entrance   Equipment Recommendations  BSC/3in1 (bari)    Recommendations for Other Services      Precautions / Restrictions Precautions Precautions: Fall Recall of Precautions/Restrictions: Intact Precaution/Restrictions Comments: watch SpO2, on trach collar Restrictions Weight Bearing Restrictions Per Provider Order: Yes RUE Weight Bearing Per Provider Order: Non weight bearing (ok to WB through RUE with RW if tolerated) LUE Weight Bearing Per Provider Order:  Weight bearing as tolerated LLE Weight Bearing Per Provider Order: Weight bearing as tolerated Other Position/Activity Restrictions: Per ortho on 4/11: NWB but if she can tolerate using the RUE for transfers she is allowed       Mobility Bed Mobility                    Transfers Overall transfer level: Needs assistance Equipment used: None Transfers: Sit to/from Stand Sit to Stand: Contact guard assist           General transfer comment: pt reports inability to stand from recliner, worked on sit to stand x 5 using L UE to push into standing.     Balance                                           ADL either performed or assessed with clinical judgement   ADL                                         General ADL Comments: educated on compenatory techniques for self feeding, pt reports frustrated with food spillage.  Edcuated on propping UE to support upper arm and improve accuracy.    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   Cognition Arousal: Alert Behavior During Therapy: WFL for tasks assessed/performed Cognition: No apparent impairments                               Following commands: Intact        Cueing   Cueing Techniques: Verbal cues  Exercises Exercises: Other exercises Other Exercises Other Exercises: worked on R UE exercises that she can work on over the weekend: table slides to R UE and shoulder rolls    Shoulder Instructions       General Comments      Pertinent Vitals/ Pain       Pain Assessment Pain Assessment: Faces Faces Pain Scale: Hurts a Croucher bit Pain Location: R shoulder, bil knees Pain Descriptors / Indicators: Discomfort, Grimacing, Guarding Pain Intervention(s): Limited activity within patient's  tolerance, Monitored during session, Repositioned  Home Living                                          Prior Functioning/Environment              Frequency  Min 3X/week        Progress Toward Goals  OT Goals(current goals can now be found in the care plan section)  Progress towards OT goals: Progressing toward goals  Acute Rehab OT Goals Patient Stated Goal: get better OT Goal Formulation: With patient Time For Goal Achievement: 11/10/23 Potential to Achieve Goals: Good  Plan      Co-evaluation                 AM-PAC OT "6 Clicks" Daily Activity     Outcome Measure   Help from another person eating meals?: None Help from another person taking care of personal grooming?: A [place] Help from another person toileting, which includes using toliet, bedpan, or urinal?: Total Help from another person bathing (including washing, rinsing, drying)?: A Lot Help from another person to put on and taking off regular upper body clothing?: A Lot Help from another person to put on and taking off regular lower body clothing?: A [place] 6 Click Score: 15    End of Session    OT Visit Diagnosis: Unsteadiness on feet (R26.81);Muscle weakness (generalized) (M62.81);History of falling (Z91.81);Pain Pain - Right/Left: Right Pain - part of body: Shoulder   Activity Tolerance Patient tolerated treatment well   Patient Left in chair;with call bell/phone within reach   Nurse Communication Mobility status        Time: 4098-1191 OT Time Calculation (min): 22 min  Charges: OT General Charges $OT Visit: 1 Visit OT Treatments $Therapeutic Exercise: 8-22 mins  Bary Boss, OT Acute Rehabilitation Services Office [phone] Secure Chat Preferred    Angel Brewer 11/06/2023, 2:17 PM

## 2023-11-06 NOTE — Progress Notes (Signed)
 [facility] ADULT ICU REPLACEMENT PROTOCOL   The patient does apply for the [facility] Adult ICU Electrolyte Replacment Protocol based on the criteria listed below:   1.Exclusion criteria: TCTS, ECMO, Dialysis, and Myasthenia Gravis patients 2. Is GFR >/= 30 ml/min? Yes.    Patient's GFR today is >60 3. Is SCr </= 2? Yes.   Patient's SCr is 0.90 mg/dL 4. Did SCr increase >/= 0.5 in 24 hours? No. 5.Pt's weight >40kg  Yes.   6. Abnormal electrolyte(s): K+ = 3.6  7. Electrolytes replaced per protocol 8.  Call MD STAT for K+ </= 2.5, Phos </= 1, or Mag </= 1 Physician:  Iran Manna, eMD   Alison Applebaum Karri Kallenbach 11/06/2023 6:06 AM

## 2023-11-07 DIAGNOSIS — Z930 Tracheostomy status: Secondary | ICD-10-CM

## 2023-11-07 LAB — GLUCOSE, CAPILLARY: Glucose-Capillary: 87 mg/dL (ref 70–99)

## 2023-11-07 LAB — BASIC METABOLIC PANEL WITH GFR
Anion gap: 8 (ref 5–15)
BUN: 15 mg/dL (ref 6–20)
CO2: 36 mmol/L — ABNORMAL HIGH (ref 22–32)
Calcium: 9 mg/dL (ref 8.9–10.3)
Chloride: 93 mmol/L — ABNORMAL LOW (ref 98–111)
Creatinine, Ser: 0.82 mg/dL (ref 0.44–1.00)
GFR, Estimated: >60 mL/min (ref >60)
Glucose, Bld: 96 mg/dL (ref 70–99)
Potassium: 3.1 mmol/L — ABNORMAL LOW (ref 3.5–5.1)
Sodium: 137 mmol/L (ref 135–145)

## 2023-11-07 MED ORDER — POTASSIUM CHLORIDE CRYS ER 20 MEQ PO TBCR
40.0000 meq | EXTENDED_RELEASE_TABLET | Freq: Once | ORAL | Status: DC
  Filled 2023-11-07: qty 2

## 2023-11-07 NOTE — Progress Notes (Signed)
 PROGRESS NOTE  Angel Brewer NGE:[id number] DOB: 07/24/1965 DOA: 10/22/2023 PCP: [name], NP   LOS: 15 days   Brief Narrative / Interim history: 58 year old female with chronic hypercarbic respiratory failure, trach dependent since May 2023, chronic diastolic CHF, OSA on CPAP at bedtime, PE and DVT on Eliquis , comes into the hospital with worsening respiratory status after mechanical fall with multiple fractures (right clavicle, left tibial plateau, L1 vertebral fractures).    Subjective: Patient had a good night.  Did not require ventilator.  Sitting up in the chair this morning.  Denies any chest pain or shortness of breath.  Has been having regular bowel movements.  Assesement and Plan:  Acute on chronic hypoxic and hypercarbic respiratory failure, chronic tracheal stenosis status, chronic OSA/obesity hypoventilation syndrome  PCCM consulted. Currently seems to be requiring vent support overnight, and she is stable during the day. Per PCCM, it may be reasonable to trial her off nocturnal ventilation periodically to see if she still needs it.   She was trialed last 3 nights and has done well.  Will wait for pulmonology input on Monday to see if we still need to proceed with trilogy ventilator.  If not she could potentially be discharged on Tuesday. Will need to verify that she has oxygen  at home.  Mechanical fall, multiple fractures Including right clavicle, left tibial plateau, L1 vertebral fractures.   Trauma, neurosurgery, orthopedic surgery saw and evaluated patient, for now continue conservative management.   She is to wear sling for comfort, TLSO brace of Hovis value due to body habitus.   Weightbearing as tolerated.   She has done well with physical therapy.  Has been ambulating more than 300 feet.  Hypokalemia Will be supplemented.  Check magnesium  levels.  Recheck labs tomorrow.  Noted to be on diuretics.  History of recurrent PE/DVT-continue  Eliquis   Chronic diastolic CHF-continue home furosemide , appears euvolemic  Essential hypertension-continue medications as below, blood pressure acceptable  Hyperlipidemia-continue statin  Obesity, morbid-BMI 62.5  Scheduled Meds:  amLODipine   5 mg Oral Daily   apixaban   5 mg Oral BID   atorvastatin   20 mg Oral Daily   Chlorhexidine  Gluconate Cloth  6 each Topical Daily   furosemide   20 mg Oral Daily   mouth rinse  15 mL Mouth Rinse 4 times per day   pantoprazole   40 mg Oral QHS   potassium chloride   40 mEq Oral Once   sodium chloride  flush  3 mL Intravenous Q12H   Continuous Infusions: PRN Meds:.acetaminophen  (TYLENOL ) oral liquid 160 mg/5 mL, diclofenac  Sodium, docusate sodium , hydrALAZINE , ipratropium-albuterol , naLOXone  (NARCAN )  injection, ondansetron  **OR** ondansetron  (ZOFRAN ) IV, mouth rinse, polyethylene glycol, sodium chloride  flush  Current Outpatient Medications  Medication Instructions   amLODipine  (NORVASC ) 5 mg, Oral, Daily   apixaban  (ELIQUIS ) 5 mg, Oral, 2 times daily   atorvastatin  (LIPITOR) 20 mg, Oral, Daily   dapagliflozin  propanediol (FARXIGA ) 10 mg, Oral, Daily   furosemide  (LASIX ) 20 mg, Oral, Daily, Take 40 mg or 2 tablets for the next 5 days then resume 20 mg or 1 tablet by mouth daily by mouth.   Misc. Devices MISC Please supply patient with tracheostomy humidifier  Z93.0   PRESCRIPTION MEDICATION 1 each, Daily at bedtime   spironolactone  (ALDACTONE ) 25 mg, Oral, Daily    Diet Orders (From admission, onward)     Start     Ordered   10/29/23 1409  Diet regular Room service appropriate? Yes with Assist; Fluid consistency: Thin  Diet effective  now       Question Answer Comment  Room service appropriate? Yes with Assist   Fluid consistency: Thin      10/29/23 1409            DVT prophylaxis: On Eliquis    Code Status: Full Code Family Communication: No family at bedside  Consultants:  PCCM Orthopedic  surgery Trauma Neurosurgery  Objective: Vitals:   11/07/23 0500 11/07/23 0600 11/07/23 0700 11/07/23 0800  BP: (!) 140/70 (!) 142/71 (!) 151/67 (!) 157/74  Pulse: (!) 52 (!) 56 (!) 52 63  Resp: 13 14 19 12   Temp:    97.8 F (36.6 C)  TempSrc:    Oral  SpO2: 97% 94% 100% 94%  Weight: (!) 175.7 kg     Height:        Intake/Output Summary (Last 24 hours) at 11/07/2023 1020 Last data filed at 11/07/2023 0900 Gross per 24 hour  Intake 240 ml  Output 500 ml  Net -260 ml   Wt Readings from Last 3 Encounters:  11/07/23 (!) 175.7 kg  09/11/23 (!) 178.3 kg  06/17/23 (!) 185.5 kg    Examination:  General appearance: Awake alert.  In no distress.  Morbidly obese Tracheostomy noted Resp: Clear to auscultation bilaterally.  Normal effort Cardio: S1-S2 is normal regular.  No S3-S4.  No rubs murmurs or bruit GI: Abdomen is soft.  Nontender nondistended.  Bowel sounds are present normal.  No masses organomegaly Extremities: No edema.  Full range of motion of lower extremities. Neurologic: No focal neurological deficits  Data Reviewed:   CBC Recent Labs  Lab 11/05/23 0638  WBC 4.3  HGB 13.3  HCT 43.0  PLT 204  MCV 94.7  MCH 29.3  MCHC 30.9  RDW 14.8    Recent Labs  Lab 11/05/23 0638 11/07/23 0723  NA 137 137  K 3.6 3.1*  CL 90* 93*  CO2 38* 36*  GLUCOSE 100* 96  BUN 16 15  CREATININE 0.90 0.82  CALCIUM  9.5 9.0  AST 13*  --   ALT 11  --   ALKPHOS 48  --   BILITOT 1.2  --   ALBUMIN 3.2*  --   MG 2.1  --     CBG: Recent Labs  Lab 11/03/23 1159 11/03/23 1629 11/04/23 1218 11/04/23 1530 11/07/23 0829  GLUCAP 88 77 78 110* 87    Radiology Studies: No results found.   Lyda Colcord  [facility]

## 2023-11-07 NOTE — Progress Notes (Signed)
 [facility] ADULT ICU REPLACEMENT PROTOCOL   The patient does apply for the [facility] Adult ICU Electrolyte Replacment Protocol based on the criteria listed below:   1.Exclusion criteria: TCTS, ECMO, Dialysis, and Myasthenia Gravis patients 2. Is GFR >/= 30 ml/min? Yes.    Patient's GFR today is >60 3. Is SCr </= 2? Yes.   Patient's SCr is 0.90 mg/dL 4. Did SCr increase >/= 0.5 in 24 hours? No. 5.Pt's weight >40kg  Yes.   6. Abnormal electrolyte(s): K+ = 3.6  7. Electrolytes replaced per protocol 8.  Call MD STAT for K+ </= 2.5, Phos </= 1, or Mag </= 1 Physician:  Washington Hacker, eMD   Alison Applebaum Khaliel Morey 11/07/2023 5:40 AM

## 2023-11-07 NOTE — Progress Notes (Signed)
 Physical Therapy Treatment Patient Details Name: Angel Brewer MRN: [id number] DOB: [birth year] Today's Date: 11/07/2023   History of Present Illness Pt is a 58 y.o. female presenting from home after a fall 4/10. Hypoxic and placed on NRB. Found to have acute superior endplate deformity of L1, R clavicular head fx, L displaced tibial plateau intercondylar fx. CTH with no acute findings, fx of L proximal humerus.  PMH significant for chronic hypoxic hypercarbic respiratory failure tracheostomy dependent since May 2023, HFpEF, OSA, PE and DVT on Eliquis , and morbid obesity (BMI 67)    PT Comments  Focused session on performing exercises or providing pt with challenges to improve her dynamic gait balance, like side stepping, rotating trunk while ambulating, changing directions, and changing head positions. No LOB noted and no assistance needed. She reports she feels confident she could get in/out of her bed at home. She was able to transfer to stand with CGA today, needing extra time to gain momentum to power up to stand, but declines a need to practice transfers this date. As pt has shown great functional improvement and is not reliant on physical assistance to transfer to stand or ambulate, updating d/c recs to HHPT. Will continue to follow acutely.    If plan is discharge home, recommend the following: Assistance with cooking/housework;Assist for transportation;Help with stairs or ramp for entrance;A Lenis help with walking and/or transfers;A [place] help with bathing/dressing/bathroom   Can travel by private vehicle        Equipment Recommendations  None recommended by PT;Other (comment) (Home O2?; states she feels she could get out of her bed at home without issues)    Recommendations for Other Services       Precautions / Restrictions Precautions Precautions: Fall Recall of Precautions/Restrictions: Intact Precaution/Restrictions Comments: watch SpO2, on trach  collar Restrictions Weight Bearing Restrictions Per Provider Order: Yes RUE Weight Bearing Per Provider Order: Non weight bearing (ok to WB through RUE with RW if tolerated) LUE Weight Bearing Per Provider Order: Weight bearing as tolerated LLE Weight Bearing Per Provider Order: Weight bearing as tolerated Other Position/Activity Restrictions: Per ortho on 4/11: NWB but if she can tolerate using the RUE for transfers she is allowed     Mobility  Bed Mobility               General bed mobility comments: Pt OOB in recliner upon arrival and at end of session    Transfers Overall transfer level: Needs assistance Equipment used: None Transfers: Sit to/from Stand Sit to Stand: Contact guard assist           General transfer comment: Extra time and pt rocking trunk repeatedly until able to gain momentum to power up to stand from recliner, CGA for safety    Ambulation/Gait Ambulation/Gait assistance: Supervision Gait Distance (Feet): 340 Feet Assistive device: None Gait Pattern/deviations: Decreased stride length, Step-through pattern, Wide base of support Gait velocity: decreased Gait velocity interpretation: <1.8 ft/sec, indicate of risk for recurrent falls   General Gait Details: Pt ambulates slowly (reportedly her baseline speed) and is able to change head positions and directions and rotate trunk while ambulating without LOB. Wide BOS likely due to body habitus. Supervision for safety   Stairs             Wheelchair Mobility     Tilt Bed    Modified Rankin (Stroke Patients Only)       Balance Overall balance assessment: Needs assistance Sitting-balance support: No upper extremity supported,  Feet supported Sitting balance-[name]: Fair     Standing balance support: During functional activity, No upper extremity supported Standing balance-[name]: Good Standing balance comment: Able to ambulate without UE support without LOB, even while  challenged via head position changes and dynamic trunk mobility             High level balance activites: Side stepping High Level Balance Comments: x10 reps side stepping bil without UE support or LOB, supervision for safety            Communication Communication Communication: No apparent difficulties Factors Affecting Communication: [name] valve  Cognition Arousal: Alert Behavior During Therapy: WFL for tasks assessed/performed   PT - Cognitive impairments: Awareness, Problem solving, Safety/Judgement                       PT - Cognition Comments: pt with poor communication of needs and particular about how to manage equipment, able to discern needs through continued communication. Improved communication noted today though compared to prior sessions Following commands: Intact      Cueing Cueing Techniques: Verbal cues  Exercises Other Exercises Other Exercises: lateral stepping bil x10 reps without LOB or assistance, supervision for safety Other Exercises: dynamic trunk rotation bil 2-3x while ambulating, no LOB, no UE support, supervision for safety    General Comments General comments (skin integrity, edema, etc.): pleth unreliable but while ambulating it was reading down into 70s% on RA and even when turned trach collar to 4L 30% FiO2, in 80s% with poor pleth when increased to 6L 35% while ambulating; 90s% on RA at rest, therefore left pt on RA end of session with trach collar within reach      Pertinent Vitals/Pain Pain Assessment Pain Assessment: Faces Faces Pain Scale: Hurts Jaster more Pain Location: R shoulder, bil knees Pain Descriptors / Indicators: Discomfort, Grimacing, Guarding Pain Intervention(s): Limited activity within patient's tolerance, Monitored during session, Repositioned    Home Living                          Prior Function            PT Goals (current goals can now be found in the care plan section) Acute  Rehab PT Goals Patient Stated Goal: return to independence PT Goal Formulation: With patient Time For Goal Achievement: 11/10/23 Potential to Achieve Goals: Good Progress towards PT goals: Progressing toward goals    Frequency    Min 2X/week      PT Plan      Co-evaluation              AM-PAC PT "6 Clicks" Mobility   Outcome Measure  Help needed turning from your back to your side while in a flat bed without using bedrails?: A Lot Help needed moving from lying on your back to sitting on the side of a flat bed without using bedrails?: A Lot Help needed moving to and from a bed to a chair (including a wheelchair)?: A Farha Help needed standing up from a chair using your arms (e.g., wheelchair or bedside chair)?: A Cordle Help needed to walk in hospital room?: A [place] Help needed climbing 3-5 steps with a railing? : A [place] 6 Click Score: 16    End of Session Equipment Utilized During Treatment: Oxygen  Activity Tolerance: Patient tolerated treatment well Patient left: with call bell/phone within reach;in chair Nurse Communication: Mobility status PT Visit Diagnosis:  Unsteadiness on feet (R26.81);Other abnormalities of gait and mobility (R26.89);Repeated falls (R29.6);Muscle weakness (generalized) (M62.81);Pain Pain - Right/Left: Right Pain - part of body: Shoulder     Time: 1342-1401 PT Time Calculation (min) (ACUTE ONLY): 19 min  Charges:    $Therapeutic Exercise: 8-22 mins PT General Charges $$ ACUTE PT VISIT: 1 Visit                     Angel Brewer, PT, DPT Acute Rehabilitation Services  Office: [phone]    Ellyn Hack 11/07/2023, 3:47 PM

## 2023-11-08 LAB — BASIC METABOLIC PANEL WITH GFR
Anion gap: 9 (ref 5–15)
BUN: 12 mg/dL (ref 6–20)
CO2: 34 mmol/L — ABNORMAL HIGH (ref 22–32)
Calcium: 9.1 mg/dL (ref 8.9–10.3)
Chloride: 94 mmol/L — ABNORMAL LOW (ref 98–111)
Creatinine, Ser: 0.84 mg/dL (ref 0.44–1.00)
GFR, Estimated: >60 mL/min (ref >60)
Glucose, Bld: 86 mg/dL (ref 70–99)
Potassium: 3.2 mmol/L — ABNORMAL LOW (ref 3.5–5.1)
Sodium: 137 mmol/L (ref 135–145)

## 2023-11-08 LAB — MAGNESIUM: Magnesium: 2.1 mg/dL (ref 1.7–2.4)

## 2023-11-08 MED ORDER — POTASSIUM CHLORIDE 10 MEQ/100ML IV SOLN
10.0000 meq | INTRAVENOUS | Status: AC
  Administered 2023-11-08 (×4): 10 meq via INTRAVENOUS
  Filled 2023-11-08 (×4): qty 100

## 2023-11-08 MED ORDER — POTASSIUM CHLORIDE CRYS ER 20 MEQ PO TBCR: 40.0000 meq | EXTENDED_RELEASE_TABLET | Freq: Once | ORAL | Status: DC

## 2023-11-08 NOTE — Progress Notes (Signed)
 Pulmonary critical care progress:    58 year old woman with known obesity hypoventilation who presented to the hospital after a mechanical fall with multiple fractures.  She has acute on chronic hypercarbic respiratory failure with tracheal stenosis and has been tracheostomy dependent for several years.  At home, she had previously been capping her tracheostomy during the day and leaving it uncapped at nighttime on home oxygen .  She had not been receiving home ventilation.  Since she has been admitted, she was found to be severely hypercarbic when left off ventilation overnight.  This was initially thought to be related to her her injuries and that this might improve with time as she became more mobile.  She is now approaching discharge and is mobilizing well.  She has been off all ventilation for over 3 nights.  Her examination is unchanged.  She is awake and alert.  Her ABG shows 7.39/63.2/78/95%.  CO2 on metabolic panel is also elevated at 34.  A review of her outpatient labs shows that her serum CO2 has been mildly elevated or borderline previously.  -The conclusion is that this patient is chronically hypoventilating and has compensated hypercapnia. -She would therefore benefit from home nocturnal ventilation.  Arlina Lair, MD [facility] ICU Physician [facility]  Pager: [phone] Or Epic Secure Chat After hours: [phone].  11/08/2023, 3:36 PM

## 2023-11-08 NOTE — Progress Notes (Signed)
 PROGRESS NOTE  Angel Brewer ZHY:[id number] DOB: October 23, 1965 DOA: 10/22/2023 PCP: [name], NP   LOS: 16 days   Brief Narrative / Interim history: 58 year old female with chronic hypercarbic respiratory failure, trach dependent since May 2023, chronic diastolic CHF, OSA on CPAP at bedtime, PE and DVT on Eliquis , comes into the hospital with worsening respiratory status after mechanical fall with multiple fractures (right clavicle, left tibial plateau, L1 vertebral fractures).    Subjective: Patient denies any complaints.  Slept well.  Did not require ventilator yesterday.  Having regular bowel movements.  Urinating well.  Appetite is good.  Assesement and Plan:  Acute on chronic hypoxic and hypercarbic respiratory failure, chronic tracheal stenosis status, chronic OSA/obesity hypoventilation syndrome  Patient was seen by pulmonology.  Was requiring ventilator in the ICU.  Has done well without ventilator use in the last 3 to 4 days.  Waiting on pulmonology input to see if she will need home ventilator or not.   Will need to verify that she has oxygen  at home.  Mechanical fall, multiple fractures Including right clavicle, left tibial plateau, L1 vertebral fractures.   Trauma, neurosurgery, orthopedic surgery evaluated patient, for now continue conservative management.   She is to wear sling for comfort, TLSO brace of Boyde value due to body habitus.   Weightbearing as tolerated.   She has done well with physical therapy.  Has been ambulating more than 300 feet.  Hypokalemia Supplementation orders were written yesterday but nurse reports that patient did not take her potassium as ordered.  She apparently ate a few bananas.  Noted to be on diuretics. Waiting on repeat levels this morning.   History of recurrent PE/DVT-continue Eliquis   Chronic diastolic CHF-continue home furosemide , appears euvolemic  Essential hypertension-continue medications as below, blood pressure  acceptable  Hyperlipidemia-continue statin  Obesity, morbid-BMI 62.5  Scheduled Meds:  amLODipine   5 mg Oral Daily   apixaban   5 mg Oral BID   atorvastatin   20 mg Oral Daily   Chlorhexidine  Gluconate Cloth  6 each Topical Daily   furosemide   20 mg Oral Daily   mouth rinse  15 mL Mouth Rinse 4 times per day   pantoprazole   40 mg Oral QHS   potassium chloride   40 mEq Oral Once   sodium chloride  flush  3 mL Intravenous Q12H   Continuous Infusions: PRN Meds:.acetaminophen  (TYLENOL ) oral liquid 160 mg/5 mL, diclofenac  Sodium, docusate sodium , hydrALAZINE , ipratropium-albuterol , naLOXone  (NARCAN )  injection, ondansetron  **OR** ondansetron  (ZOFRAN ) IV, mouth rinse, polyethylene glycol, sodium chloride  flush  Current Outpatient Medications  Medication Instructions   amLODipine  (NORVASC ) 5 mg, Oral, Daily   apixaban  (ELIQUIS ) 5 mg, Oral, 2 times daily   atorvastatin  (LIPITOR) 20 mg, Oral, Daily   dapagliflozin  propanediol (FARXIGA ) 10 mg, Oral, Daily   furosemide  (LASIX ) 20 mg, Oral, Daily, Take 40 mg or 2 tablets for the next 5 days then resume 20 mg or 1 tablet by mouth daily by mouth.   Misc. Devices MISC Please supply patient with tracheostomy humidifier  Z93.0   PRESCRIPTION MEDICATION 1 each, Daily at bedtime   spironolactone  (ALDACTONE ) 25 mg, Oral, Daily    Diet Orders (From admission, onward)     Start     Ordered   10/29/23 1409  Diet regular Room service appropriate? Yes with Assist; Fluid consistency: Thin  Diet effective now       Question Answer Comment  Room service appropriate? Yes with Assist   Fluid consistency: Thin  10/29/23 1409            DVT prophylaxis: On Eliquis    Code Status: Full Code Family Communication: No family at bedside  Consultants:  PCCM Orthopedic surgery Trauma Neurosurgery  Objective: Vitals:   11/08/23 0600 11/08/23 0730 11/08/23 0757 11/08/23 0800  BP: 118/66   (!) 123/55  Pulse: (!) 49 (!) 49  (!) 57  Resp: 13 14  14    Temp:   98.1 F (36.7 C)   TempSrc:   Oral   SpO2: 97% 97%  97%  Weight:      Height:        Intake/Output Summary (Last 24 hours) at 11/08/2023 0858 Last data filed at 11/07/2023 0900 Gross per 24 hour  Intake --  Output 500 ml  Net -500 ml   Wt Readings from Last 3 Encounters:  11/07/23 (!) 175.7 kg  09/11/23 (!) 178.3 kg  06/17/23 (!) 185.5 kg    Examination:  General appearance: Awake alert.  In no distress Resp: Chest pain noted.  Coarse breath sounds bilaterally.  No wheezing or rhonchi. Cardio: S1-S2 is normal regular.  No S3-S4.  No rubs murmurs or bruit GI: Abdomen is soft.  Nontender nondistended.  Bowel sounds are present normal.  No masses organomegaly Extremities: No edema.  Full range of motion of lower extremities. Neurologic: Focal deficits   Data Reviewed:   CBC Recent Labs  Lab 11/05/23 0638  WBC 4.3  HGB 13.3  HCT 43.0  PLT 204  MCV 94.7  MCH 29.3  MCHC 30.9  RDW 14.8    Recent Labs  Lab 11/05/23 0638 11/07/23 0723  NA 137 137  K 3.6 3.1*  CL 90* 93*  CO2 38* 36*  GLUCOSE 100* 96  BUN 16 15  CREATININE 0.90 0.82  CALCIUM  9.5 9.0  AST 13*  --   ALT 11  --   ALKPHOS 48  --   BILITOT 1.2  --   ALBUMIN 3.2*  --   MG 2.1  --     CBG: Recent Labs  Lab 11/03/23 1159 11/03/23 1629 11/04/23 1218 11/04/23 1530 11/07/23 0829  GLUCAP 88 77 78 110* 87    Radiology Studies: No results found.   Furman Trentman  [facility]

## 2023-11-08 NOTE — Progress Notes (Signed)
 Morning abg results: 7.39    63.2    78    HCO3: 39.2    SO2: 95

## 2023-11-09 DIAGNOSIS — I2782 Chronic pulmonary embolism: Secondary | ICD-10-CM

## 2023-11-09 DIAGNOSIS — E876 Hypokalemia: Secondary | ICD-10-CM

## 2023-11-09 LAB — POCT I-STAT 7, (LYTES, BLD GAS, ICA,H+H)
Acid-Base Excess: 12 mmol/L — ABNORMAL HIGH (ref 0.0–2.0)
Bicarbonate: 39.2 mmol/L — ABNORMAL HIGH (ref 20.0–28.0)
Calcium, Ion: 1.23 mmol/L (ref 1.15–1.40)
HCT: 38 % (ref 36.0–46.0)
Hemoglobin: 12.9 g/dL (ref 12.0–15.0)
O2 Saturation: 95 %
Patient temperature: 97.9
Potassium: 2.8 mmol/L — ABNORMAL LOW (ref 3.5–5.1)
Sodium: 136 mmol/L (ref 135–145)
TCO2: 41 mmol/L — ABNORMAL HIGH (ref 22–32)
pCO2 arterial: 63.2 mmHg — ABNORMAL HIGH (ref 32–48)
pH, Arterial: 7.399 (ref 7.35–7.45)
pO2, Arterial: 78 mmHg — ABNORMAL LOW (ref 83–108)

## 2023-11-09 LAB — BASIC METABOLIC PANEL WITH GFR
Anion gap: 8 (ref 5–15)
BUN: 12 mg/dL (ref 6–20)
CO2: 34 mmol/L — ABNORMAL HIGH (ref 22–32)
Calcium: 8.9 mg/dL (ref 8.9–10.3)
Chloride: 96 mmol/L — ABNORMAL LOW (ref 98–111)
Creatinine, Ser: 0.93 mg/dL (ref 0.44–1.00)
GFR, Estimated: >60 mL/min (ref >60)
Glucose, Bld: 91 mg/dL (ref 70–99)
Potassium: 3.1 mmol/L — ABNORMAL LOW (ref 3.5–5.1)
Sodium: 138 mmol/L (ref 135–145)

## 2023-11-09 MED ORDER — POTASSIUM CHLORIDE CRYS ER 20 MEQ PO TBCR
40.0000 meq | EXTENDED_RELEASE_TABLET | Freq: Once | ORAL | Status: DC
  Filled 2023-11-09: qty 2

## 2023-11-09 MED ORDER — SPIRONOLACTONE 25 MG PO TABS
25.0000 mg | ORAL_TABLET | Freq: Every day | ORAL | Status: DC
  Administered 2023-11-09 – 2023-11-17 (×9): 25 mg via ORAL
  Filled 2023-11-09 (×9): qty 1

## 2023-11-09 MED ORDER — POTASSIUM CHLORIDE 10 MEQ/100ML IV SOLN: 10.0000 meq | INTRAVENOUS | Status: DC

## 2023-11-09 MED ORDER — POTASSIUM CHLORIDE CRYS ER 20 MEQ PO TBCR: 20.0000 meq | EXTENDED_RELEASE_TABLET | Freq: Once | ORAL | Status: DC

## 2023-11-09 NOTE — Progress Notes (Signed)
 Occupational Therapy Treatment Patient Details Name: Angel Brewer MRN: [id number] DOB: 1965-12-31 Today's Date: 11/09/2023   History of present illness Pt is a 58 y.o. female presenting from home after a fall 4/10. Hypoxic and placed on NRB. Found to have acute superior endplate deformity of L1, R clavicular head fx, L displaced tibial plateau intercondylar fx. CTH with no acute findings, fx of L proximal humerus.  PMH significant for chronic hypoxic hypercarbic respiratory failure tracheostomy dependent since May 2023, HFpEF, OSA, PE and DVT on Eliquis , and morbid obesity (BMI 67)   OT comments  Pt progressing toward established OT goals. Focus session on return to ADL and optimizing activity tolerance. Pt with goals due today and 3/4 goals upgraded to reflect pt current functional status and goals for progression. Pt able to sponge bathe standing at sink with min A for UB and max A for LB secondary to lack of AE, however, pt has long handled sponge for LB at home. Educated re: how to use for RUE at home. Session conduced on RA. With intermittent standing rest breaks. AROM of RUE to improve mobility at end of session. Will continue to follow. Discharge plan upgraded to home health secondary to continued improvement functionally.       If plan is discharge home, recommend the following:  A Schloesser help with walking and/or transfers;A Worsley help with bathing/dressing/bathroom;Assistance with cooking/housework;Help with stairs or ramp for entrance   Equipment Recommendations       Recommendations for Other Services      Precautions / Restrictions Precautions Precautions: Fall Recall of Precautions/Restrictions: Intact Precaution/Restrictions Comments: watch SpO2, on trach collar Required Braces or Orthoses: Sling (for comfort) Restrictions Weight Bearing Restrictions Per Provider Order: No RUE Weight Bearing Per Provider Order: Non weight bearing LUE Weight Bearing Per Provider  Order: Weight bearing as tolerated LLE Weight Bearing Per Provider Order: Weight bearing as tolerated       Mobility Bed Mobility Overal bed mobility: Needs Assistance Bed Mobility: Supine to Sit     Supine to sit: Min assist     General bed mobility comments: light assist for truncal elevation. Pt using momentum appropriately    Transfers Overall transfer level: Needs assistance Equipment used: None Transfers: Sit to/from Stand Sit to Stand: Contact guard assist           General transfer comment: extra time and bed slightly elevated; use of momentum     Balance Overall balance assessment: Needs assistance Sitting-balance support: No upper extremity supported, Feet supported   Sitting balance - Comments: fair at EOB; good from recliner and BSC   Standing balance support: During functional activity, No upper extremity supported Standing balance-[name]: Good Standing balance comment: Able to ambulate without UE support without LOB                           ADL either performed or assessed with clinical judgement   ADL Overall ADL's : Needs assistance/impaired     Grooming: Supervision/safety;Standing;Oral care;Wash/dry face Grooming Details (indicate cue type and reason): cues to use RUE Upper Body Bathing: Minimal assistance;Standing Upper Body Bathing Details (indicate cue type and reason): min A to wash R underarm area; discussed compensatory techniques and AE; pt with AE at home that will be needed to perform without A Lower Body Bathing: Maximal assistance;Sitting/lateral leans Lower Body Bathing Details (indicate cue type and reason): OT assisting this date; pt has long handled sponge at home  as well as AE for cleaning bottom         Toilet Transfer: Ambulation;Supervision/safety           Functional mobility during ADLs: Contact guard assist General ADL Comments: CGA for longer distances    Extremity/Trunk Assessment Upper  Extremity Assessment Upper Extremity Assessment: Right hand dominant;RUE deficits/detail RUE Deficits / Details: AROM at shoulder 0-90 flexion, 0-30 abduction. RUE Coordination: decreased gross motor   Lower Extremity Assessment Lower Extremity Assessment: Defer to PT evaluation        Vision       Perception     Praxis     Communication Communication Communication: No apparent difficulties Factors Affecting Communication: [name] valve (pt independently dons/doffs)   Cognition Arousal: Alert Behavior During Therapy: WFL for tasks assessed/performed Cognition: No apparent impairments                               Following commands: Intact        Cueing   Cueing Techniques: Verbal cues  Exercises Exercises: Other exercises General Exercises - Upper Extremity Shoulder Flexion: Right, 10 reps, AROM (within pain tolerance) Elbow Flexion: AROM, Right, 10 reps, Both Elbow Extension: AROM, Right, 10 reps    Shoulder Instructions       General Comments PLETH unreliable during mobility. Pt walked on RA for short distances without supplementary O2. When reading reliable ~88-92 SpO2    Pertinent Vitals/ Pain       Pain Assessment Pain Assessment: Faces Faces Pain Scale: Hurts a Waxman bit Pain Location: R shoulder Pain Descriptors / Indicators: Discomfort, Grimacing, Guarding Pain Intervention(s): Limited activity within patient's tolerance, Monitored during session  Home Living                                          Prior Functioning/Environment              Frequency  Min 3X/week        Progress Toward Goals  OT Goals(current goals can now be found in the care plan section)  Progress towards OT goals: Progressing toward goals  Acute Rehab OT Goals Patient Stated Goal: get better OT Goal Formulation: With patient Time For Goal Achievement: 11/24/23 Potential to Achieve Goals: Good ADL Goals Pt Will Perform  Grooming: with modified independence;standing Pt Will Perform Lower Body Dressing: with modified independence;sit to/from stand;with adaptive equipment Pt Will Transfer to Toilet: ambulating;with modified independence  Plan      Co-evaluation                 AM-PAC OT "6 Clicks" Daily Activity     Outcome Measure   Help from another person eating meals?: None Help from another person taking care of personal grooming?: A Godlewski Help from another person toileting, which includes using toliet, bedpan, or urinal?: Total Help from another person bathing (including washing, rinsing, drying)?: A Lot Help from another person to put on and taking off regular upper body clothing?: A Lot Help from another person to put on and taking off regular lower body clothing?: A [place] 6 Click Score: 15    End of Session Equipment Utilized During Treatment: Gait belt  OT Visit Diagnosis: Unsteadiness on feet (R26.81);Muscle weakness (generalized) (M62.81);History of falling (Z91.81);Pain Pain - Right/Left: Right Pain - part of body: Shoulder  Activity Tolerance Patient tolerated treatment well   Patient Left in chair;with call bell/phone within reach   Nurse Communication Mobility status        Time: 1610-9604 OT Time Calculation (min): 30 min  Charges: OT General Charges $OT Visit: 1 Visit OT Treatments $Self Care/Home Management : 8-22 mins $Therapeutic Activity: 8-22 mins  Karilyn Ouch, OTR/L [facility] Acute Rehabilitation Office: [phone]   Emery Hans 11/09/2023, 9:29 AM

## 2023-11-09 NOTE — Progress Notes (Addendum)
 PROGRESS NOTE  Angel Brewer ZDG:[id number] DOB: [birth year] DOA: 10/22/2023 PCP: [name], NP   LOS: 17 days   Brief Narrative / Interim history: 58 year old female with chronic hypercarbic respiratory failure, trach dependent since May 2023, chronic diastolic CHF, OSA on CPAP at bedtime, PE and DVT on Eliquis , comes into the hospital with worsening respiratory status after mechanical fall with multiple fractures (right clavicle, left tibial plateau, L1 vertebral fractures).    Subjective: Patient denies any complaints this morning.  Slept well.    Assesement and Plan:  Acute on chronic hypoxic and hypercarbic respiratory failure, chronic tracheal stenosis status, chronic OSA/obesity hypoventilation syndrome  Patient was seen by pulmonology.  Was requiring ventilator in the ICU.  Has done well without ventilator use in the last 3 to 4 days.   Pulmonology waited yesterday.  They think that considering her hypercapnia she will benefit from having ventilator during nighttime at home.  DME order has been placed by pulmonology.  TOC to pursue this further.   Will also need to verify that she has oxygen  at home.  Mechanical fall, multiple fractures Including right clavicle, left tibial plateau, L1 vertebral fractures.   Trauma, neurosurgery, orthopedic surgery evaluated patient, for now continue conservative management.   Seems to have improved.  No longer requiring sling.  Has worked well with physical therapy.  Hypokalemia Remains persistently hypokalemic.  Unable to tolerate oral potassium.  Has been eating bananas.  Will give additional IV potassium today.  Magnesium  was 2.1 yesterday.  Noted to be on furosemide  which will be held for now.  At home she was taking spironolactone  which can be resumed.  History of recurrent PE/DVT-continue Eliquis   Chronic diastolic CHF-seems euvolemic.  Persistent hypokalemia noted.  Holding furosemide .  Resume spironolactone .  Essential  hypertension-continue medications as below, blood pressure acceptable  Hyperlipidemia-continue statin  Obesity, morbid-BMI 62.5  Scheduled Meds:  amLODipine   5 mg Oral Daily   apixaban   5 mg Oral BID   atorvastatin   20 mg Oral Daily   Chlorhexidine  Gluconate Cloth  6 each Topical Daily   furosemide   20 mg Oral Daily   mouth rinse  15 mL Mouth Rinse 4 times per day   pantoprazole   40 mg Oral QHS   sodium chloride  flush  3 mL Intravenous Q12H   Continuous Infusions:  potassium chloride      PRN Meds:.acetaminophen  (TYLENOL ) oral liquid 160 mg/5 mL, diclofenac  Sodium, docusate sodium , hydrALAZINE , ipratropium-albuterol , naLOXone  (NARCAN )  injection, ondansetron  **OR** ondansetron  (ZOFRAN ) IV, mouth rinse, polyethylene glycol, sodium chloride  flush  Current Outpatient Medications  Medication Instructions   amLODipine  (NORVASC ) 5 mg, Oral, Daily   apixaban  (ELIQUIS ) 5 mg, Oral, 2 times daily   atorvastatin  (LIPITOR) 20 mg, Oral, Daily   dapagliflozin  propanediol (FARXIGA ) 10 mg, Oral, Daily   furosemide  (LASIX ) 20 mg, Oral, Daily, Take 40 mg or 2 tablets for the next 5 days then resume 20 mg or 1 tablet by mouth daily by mouth.   Misc. Devices MISC Please supply patient with tracheostomy humidifier  Z93.0   PRESCRIPTION MEDICATION 1 each, Daily at bedtime   spironolactone  (ALDACTONE ) 25 mg, Oral, Daily    Diet Orders (From admission, onward)     Start     Ordered   10/29/23 1409  Diet regular Room service appropriate? Yes with Assist; Fluid consistency: Thin  Diet effective now       Question Answer Comment  Room service appropriate? Yes with Assist   Fluid consistency: Thin  10/29/23 1409            DVT prophylaxis: On Eliquis    Code Status: Full Code Family Communication: No family at bedside  Consultants:  PCCM Orthopedic surgery Trauma Neurosurgery  Objective: Vitals:   11/09/23 0600 11/09/23 0700 11/09/23 0722 11/09/23 0800  BP: 124/68 128/71  128/70   Pulse: (!) 52 (!) 59  (!) 53  Resp: 13 17  15   Temp:    98 F (36.7 C)  TempSrc:    Oral  SpO2: 100% 100%  95%  Weight:   (!) 175 kg   Height:        Intake/Output Summary (Last 24 hours) at 11/09/2023 0828 Last data filed at 11/09/2023 0500 Gross per 24 hour  Intake 399.12 ml  Output 450 ml  Net -50.88 ml   Wt Readings from Last 3 Encounters:  11/09/23 (!) 175 kg  09/11/23 (!) 178.3 kg  06/17/23 (!) 185.5 kg    Examination:  General appearance: Awake alert.  In no distress Resp: Tracheostomy noted.  Coarse breath sounds bilaterally.  No wheezing. Cardio: S1-S2 is normal regular.  No S3-S4.  No rubs murmurs or bruit GI: Abdomen is soft.  Nontender nondistended.  Bowel sounds are present normal.  No masses organomegaly   Data Reviewed:   CBC Recent Labs  Lab 11/05/23 0638  WBC 4.3  HGB 13.3  HCT 43.0  PLT 204  MCV 94.7  MCH 29.3  MCHC 30.9  RDW 14.8    Recent Labs  Lab 11/05/23 0638 11/07/23 0723 11/08/23 0813 11/09/23 0634  NA 137 137 137 138  K 3.6 3.1* 3.2* 3.1*  CL 90* 93* 94* 96*  CO2 38* 36* 34* 34*  GLUCOSE 100* 96 86 91  BUN 16 15 12 12   CREATININE 0.90 0.82 0.84 0.93  CALCIUM  9.5 9.0 9.1 8.9  AST 13*  --   --   --   ALT 11  --   --   --   ALKPHOS 48  --   --   --   BILITOT 1.2  --   --   --   ALBUMIN 3.2*  --   --   --   MG 2.1  --  2.1  --     CBG: Recent Labs  Lab 11/03/23 1159 11/03/23 1629 11/04/23 1218 11/04/23 1530 11/07/23 0829  GLUCAP 88 77 78 110* 87    Radiology Studies: No results found.   Carli Lefevers  [facility]

## 2023-11-09 NOTE — Telephone Encounter (Signed)
  Copied from CRM [id number]. Topic: General - Other >> Nov 02, 2023 12:55 PM DeAngela L wrote: Reason for CRM: Patient admitted to the [facility] hospital 4/10 and she has disability questions, she says she was approved for disability and when in the hospital she discovered she doesn't have medical coverage and would like to ask if there is any advice to give her so she can complete the process while she is in the hospital. She just wants a instructions on what to do, she states there is no one to answer or follow up with her questions in the hospital this is why she is reaching out to the office,  Patient called asked if she could speak with Burnett Carson she was very helpful before  Patient call back number [phone] (M)  >> Nov 09, 2023  3:14 PM DeAngela L wrote: Beauford Bounds [phone] case manager at [facility] calling about the status of patients medical insurance application or any additional information to help the patient get coverage and if calling tomorrow please contact Shelvy Dickens [phone]  The plan is for the patient to go home but she will need the [employer] vent and [facility] Cones can only cover it for 30 days and they are trying to make sure she get medical coverage cause she needs this vent to be covered after the 30 days

## 2023-11-09 NOTE — TOC Progression Note (Addendum)
 Transition of Care [facility]) - Progression Note    Patient Details  Name: Angel Brewer MRN: [id number] Date of Birth: [birth year]  Transition of Care [facility] CM/SW Contact  Jannine Meo, RN Phone Number: 11/09/2023, 3:10 PM  Clinical Narrative:  Phone call to [facility] and wellness center to follow up on [employer] healthcare insurance approval. Message left with staff to reach case manager there for call back to CM today or covering case manager for tomorrow,    Expected Discharge Plan: IP Rehab Facility    Expected Discharge Plan and Services                                               Social Determinants of Health (SDOH) Interventions SDOH Screenings   Food Insecurity: No Food Insecurity (10/26/2023)  Housing: Low Risk  (10/26/2023)  Transportation Needs: No Transportation Needs (10/26/2023)  Utilities: Not At Risk (10/26/2023)  Depression (PHQ2-9): Low Risk  (06/17/2023)  Financial Resource Strain: Medium Risk (06/16/2023)  Physical Activity: Sufficiently Active (06/16/2023)  Social Connections: Socially Isolated (06/16/2023)  Stress: No Stress Concern Present (06/16/2023)  Tobacco Use: Medium Risk (10/23/2023)  Health Literacy: Adequate Health Literacy (06/17/2023)    Readmission Risk Interventions    09/03/2023    1:17 PM 12/20/2021   11:33 AM  Readmission Risk Prevention Plan  Post Dischage Appt Complete   Medication Screening Complete   Transportation Screening Complete Complete  PCP or Specialist Appt within 3-5 Days  Complete  HRI or Home Care Consult  Complete  Social Work Consult for Recovery Care Planning/Counseling  Complete  Palliative Care Screening  Not Applicable  Medication Review Oceanographer)  Complete

## 2023-11-09 NOTE — Progress Notes (Signed)
 Physical Therapy Treatment Patient Details Name: Angel Brewer MRN: [id number] DOB: Mar 21, 1966 Today's Date: 11/09/2023   History of Present Illness Pt is a 58 y.o. female presenting from home after a fall 4/10. Hypoxic and placed on NRB. Found to have acute superior endplate deformity of L1, R clavicular head fx, L displaced tibial plateau intercondylar fx. CTH with no acute findings, fx of L proximal humerus.  PMH significant for chronic hypoxic hypercarbic respiratory failure tracheostomy dependent since May 2023, HFpEF, OSA, PE and DVT on Eliquis , and morbid obesity (BMI 67)    PT Comments  Pt progressing steadily towards her physical therapy goals and is motivated to participate. Reports bilateral knee stiffness from sitting up in chair for extended period. Ambulating 150 ft with no assistive device and supervision; HR up to 130 bpm, SPO2 > 88% on 6L O2, 21% FiO2 via trach collar. Further distance limited by bowel urgency and assisted to bathroom. Recommendation remains appropriate for HHPT.     If plan is discharge home, recommend the following: Assistance with cooking/housework;Assist for transportation;Help with stairs or ramp for entrance;A Demery help with walking and/or transfers;A [place] help with bathing/dressing/bathroom   Can travel by private vehicle        Equipment Recommendations  None recommended by PT    Recommendations for Other Services       Precautions / Restrictions Precautions Precautions: Fall Recall of Precautions/Restrictions: Intact Precaution/Restrictions Comments: watch SpO2, on trach collar Restrictions Weight Bearing Restrictions Per Provider Order: No RUE Weight Bearing Per Provider Order: Non weight bearing LUE Weight Bearing Per Provider Order: Weight bearing as tolerated LLE Weight Bearing Per Provider Order: Weight bearing as tolerated     Mobility  Bed Mobility Overal bed mobility: Needs Assistance Bed Mobility: Sit to Supine      Supine to sit: Contact guard          Transfers Overall transfer level: Needs assistance Equipment used: None Transfers: Sit to/from Stand Sit to Stand: Contact guard assist           General transfer comment: extra time and use of momentum    Ambulation/Gait Ambulation/Gait assistance: Supervision Gait Distance (Feet): 150 Feet Assistive device: None Gait Pattern/deviations: Decreased stride length, Step-through pattern, Wide base of support Gait velocity: decreased     General Gait Details: Supervision for safety   Stairs             Wheelchair Mobility     Tilt Bed    Modified Rankin (Stroke Patients Only)       Balance Overall balance assessment: Needs assistance Sitting-balance support: No upper extremity supported, Feet supported Sitting balance-[name]: Good     Standing balance support: During functional activity, No upper extremity supported Standing balance-[name]: Good Standing balance comment: Able to ambulate without UE support without LOB                            Communication Communication Communication: No apparent difficulties Factors Affecting Communication: [name] valve (pt independently dons/doffs)  Cognition Arousal: Alert Behavior During Therapy: WFL for tasks assessed/performed   PT - Cognitive impairments: No apparent impairments                         Following commands: Intact      Cueing Cueing Techniques: Verbal cues  Exercises      General Comments  Pertinent Vitals/Pain Pain Assessment Pain Assessment: Faces Faces Pain Scale: Hurts a Roy bit Pain Location: bilateral knees Pain Descriptors / Indicators: Discomfort, Grimacing, Guarding Pain Intervention(s): Monitored during session    Home Living                          Prior Function            PT Goals (current goals can now be found in the care plan section) Acute Rehab PT  Goals Potential to Achieve Goals: Good Progress towards PT goals: Progressing toward goals    Frequency    Min 2X/week      PT Plan      Co-evaluation              AM-PAC PT "6 Clicks" Mobility   Outcome Measure  Help needed turning from your back to your side while in a flat bed without using bedrails?: A Prisk Help needed moving from lying on your back to sitting on the side of a flat bed without using bedrails?: A Linson Help needed moving to and from a bed to a chair (including a wheelchair)?: A Cruise Help needed standing up from a chair using your arms (e.g., wheelchair or bedside chair)?: A Clucas Help needed to walk in hospital room?: A Ziemba Help needed climbing 3-5 steps with a railing? : A Lot 6 Click Score: 17    End of Session   Activity Tolerance: Patient tolerated treatment well Patient left: in bed;with call bell/phone within reach;with nursing/sitter in room Nurse Communication: Mobility status PT Visit Diagnosis: Unsteadiness on feet (R26.81);Other abnormalities of gait and mobility (R26.89);Repeated falls (R29.6);Muscle weakness (generalized) (M62.81);Pain Pain - Right/Left: Right Pain - part of body: Shoulder     Time: 1610-9604 PT Time Calculation (min) (ACUTE ONLY): 33 min  Charges:    $Therapeutic Activity: 23-37 mins PT General Charges $$ ACUTE PT VISIT: 1 Visit                     [name], PT, DPT Acute Rehabilitation Services Office [phone]    [name] 11/09/2023, 4:17 PM

## 2023-11-09 NOTE — Progress Notes (Signed)
 NAME:  Angel Brewer, MRN:  [id number], DOB:  1965/07/18, LOS: 17 ADMISSION DATE:  10/22/2023 CONSULTATION DATE:  10/23/2023 REFERRING MD:  Sundil - [facility] CHIEF COMPLAINT: Fall, acute-on-chronic respiratory failure   History of Present Illness:  58 year old woman who presented to [facility] ED 4/10 for fall and AoC RF. PMHx significant for HTN, HLD, chronic diastolic CHF (Echo 11/2021 with EF 60-65%, mild LVH), OHS/OSA s/p tracheostomy 11/2021 (on CPAP/nocturnal O2, followed by Sheryle Donning, NP) with chronic hypoxic and hypercarbic respiratory failure, DVT/PE (on Eliquis ). Recent hospitalization 08/2023 for AoC RF, due to follow up in trach clinic around current time (~6 weeks from discharge date).   History is obtained primarily from chart review. Patient reportedly had a fall from the toilet around 1000 4/10AM and was unable to get up from the bathroom floor; experienced L leg pain and R shoulder pain. EMS was called and on arrival she was noted to be hypoxic on RA (SpO2 60s) and was placed on NRB 15L with SpO2 improved to 98%. Denied LOC/head injury.  On ED arrival, patient was afebrile, HR 96, BP 165/118, RR 25, SpO2 100% on NRB. CXR showed no active disease. CT Head/C-spine with NAICA, no acute displaced fracture/traumatic listhesis of C-spine. CT Chest/A/P showed new endplate deformity of L1, nondisplaced fractures of the R clavicular head, nondisplaced fractures of the proximal L humerus, tracheostomy. XR of L Femur demonstrated acute displaced tibial plateau intercondylar fracture and tricompartmental degenerative changes of the L knee.  Labs were notable for CBC WNL, INR 1.1, Na 138, K 4.5, CO2 30, Cr 0.96, AST/ALT/Alk Phos WNL, Tbili 1.7. CK 385, LA 1.6. Ethanol < 10. Initial VBG with pH 7.215/pCO2 93.7, bicarb 37.8. Patient was deemed to require mechanical ventilation and tracheostomy was exchanged by RT from #6 Shiley cuffless to #6 Shiley cuffed tracheostomy. Patient was subsequently placed on MV.  Repeat ABG improved to 7.349/pCO2 64.8/pO2 63/bicarb 35.7.  PCCM consulted for evaluation and management.  [name] states she felt dizzy prior to her fall, then was too weak too stand up (later found to have fractures as well). She feels like she has been sleeping much more lately and that her energy really never recovered from her hospitalization in February. She otherwise felt she had been doing well, no issues with her oxygenation or tracheostomy. Denies fever/chills, CP/SOB, n/v/d, recent sick contacts.  On 4/12 overnight patient on floors trach collar. Was initially Aox4. Throughout the night became unresponsive. Given narcan  with some improvement in mentation but still confused. ABG showing co2 >100. Transferred to ICU and placed on vent.   Pertinent Medical History:   Past Medical History:  Diagnosis Date   Chronic diastolic CHF (congestive heart failure) (HCC) 01/05/2022   DVT (deep vein thrombosis) in pregnancy    RLE DVT 02/2016   Dyspnea    Essential hypertension 08/25/2017   Hyperlipidemia 11/21/2021   Morbid obesity (HCC)    Obesity hypoventilation syndrome (HCC) 05/03/2021   PE (pulmonary embolism) 02/29/2016   Sleep apnea    uses a cpap   Tracheostomy present (HCC) 01/05/2022   Size 6 shiley/cuffless  Last changed: 10/9     Discussion  Stable trach.  She does get a Bobier more short of breath with exertion.  Although I did not observe it on my exam she does endorse she will occasionally have a rush of air after removing PMV following activity.  This could suggest some mild air trapping from the trach.  Otherwise she is doing fantastic with  the trach     Plan  Cont routin   Significant Hospital Events: Including procedures, antibiotic start and stop dates in addition to other pertinent events   4/10 - Presented to [facility] after fall. Hypoxic on RA with SpO2 60s per EMS, placed on NRB with improvement. Initial VBG poor with pH 7.219 4/11 has been off the vent last two  nights 4/21 No acute overnight events, remains on trach collar Given her chronic hypercapnia plan to discharge on nocturnal vent  4/21 no issues overnight, utilize event overnight 4/27 ABG 7.39/63.2/78/39.2/95 -- PCCM rec noct vent  4/28 reiterated plan for noct vent   Interim History / Subjective:   Seen by CCM yesterday who rec noct vent Off vent overnight   Seen in conjunction w TRH this morning   Objective:  Blood pressure 128/71, pulse (!) 59, temperature 99.2 F (37.3 C), temperature source Oral, resp. rate 17, height 5\' 6"  (1.676 m), weight (!) 175 kg, last menstrual period 11/29/2015, SpO2 100%.    FiO2 (%):  [21 %-28 %] 21 %   Intake/Output Summary (Last 24 hours) at 11/09/2023 0758 Last data filed at 11/09/2023 0500 Gross per 24 hour  Intake 399.12 ml  Output 450 ml  Net -50.88 ml   Filed Weights   11/06/23 0701 11/07/23 0500 11/09/23 0722  Weight: (!) 175.7 kg (!) 175.7 kg (!) 175 kg   Physical Examination: General: chronically ill pleasant F NAD  HEENT: Trach midline, secure  Neuro: AAOx4  CV: cap refill brisk  PULM: shallow resp. Even and unlabored on trach collar  GI: round abdomen  Extremities: no cyanosis or clubbing  Skin: c/d/w Resolved Hospital Problem List:    Assessment & Plan:    AoC hypoxic and hypercarbic resp failure OSA/OHS Chronic trach -clinically has done ok for several days without noct vent support however gas still concerning for hypoventilation. Expect that over time would become symptomatic again if she is not supported w noct vent  P: -ATC during day, vent support at night -TOC working on home trilogy  -SLP / PT / OT       PCCM will continue to follow along   Best Practice (right click and "Reselect all SmartList Selections" daily)   Diet/type: Regular consistency (see orders) DVT prophylaxis: DOAC GI prophylaxis: PPI Central venous access:  N/A Foley:  N/A Code Status:  full code Last date of multidisciplinary  goals of care discussion: 4/28 -- TRH / PCCM + pt     Critical care time: NA     Low MDM   Eston Hence MSN, AGACNP-BC [facility] Pulmonary/Critical Care Medicine Amion for pager  11/09/2023, 8:56 AM

## 2023-11-10 LAB — BASIC METABOLIC PANEL WITH GFR
Anion gap: 8 (ref 5–15)
BUN: 10 mg/dL (ref 6–20)
CO2: 35 mmol/L — ABNORMAL HIGH (ref 22–32)
Calcium: 8.7 mg/dL — ABNORMAL LOW (ref 8.9–10.3)
Chloride: 97 mmol/L — ABNORMAL LOW (ref 98–111)
Creatinine, Ser: 0.82 mg/dL (ref 0.44–1.00)
GFR, Estimated: >60 mL/min (ref >60)
Glucose, Bld: 89 mg/dL (ref 70–99)
Potassium: 3.1 mmol/L — ABNORMAL LOW (ref 3.5–5.1)
Sodium: 140 mmol/L (ref 135–145)

## 2023-11-10 LAB — MAGNESIUM: Magnesium: 2.1 mg/dL (ref 1.7–2.4)

## 2023-11-10 NOTE — Progress Notes (Signed)
 Physical Therapy Treatment Patient Details Name: Angel Brewer MRN: [id number] DOB: [birth year] Today's Date: 11/10/2023   History of Present Illness Pt is a 58 y.o. female presenting from home after a fall 4/10. Hypoxic and placed on NRB. Found to have acute superior endplate deformity of L1, R clavicular head fx, L displaced tibial plateau intercondylar fx. CTH with no acute findings, fx of L proximal humerus.  PMH significant for chronic hypoxic hypercarbic respiratory failure tracheostomy dependent since May 2023, HFpEF, OSA, PE and DVT on Eliquis , and morbid obesity (BMI 67)    PT Comments  Pt making excellent progress towards her physical therapy goals and is motivated to participate. Pt ambulating 500 ft with no assistive device at a supervision level. HR up to 125 bpm. Will continue to progress as tolerated.     If plan is discharge home, recommend the following: Assistance with cooking/housework;Assist for transportation;Help with stairs or ramp for entrance;[name] help with walking and/or transfers;[name] help with bathing/dressing/bathroom   Can travel by private vehicle        Equipment Recommendations  None recommended by PT    Recommendations for Other Services       Precautions / Restrictions Precautions Precautions: Fall Recall of Precautions/Restrictions: Intact Precaution/Restrictions Comments: watch SpO2, on trach collar Restrictions Weight Bearing Restrictions Per Provider Order: No RUE Weight Bearing Per Provider Order: Non weight bearing LUE Weight Bearing Per Provider Order: Weight bearing as tolerated LLE Weight Bearing Per Provider Order: Weight bearing as tolerated     Mobility  Bed Mobility Overal bed mobility: Modified Independent             General bed mobility comments: Increased time    Transfers Overall transfer level: Needs assistance Equipment used: None Transfers: Sit to/from Stand Sit to Stand: Supervision            General transfer comment: extra time and use of momentum    Ambulation/Gait Ambulation/Gait assistance: Supervision Gait Distance (Feet): 500 Feet Assistive device: None Gait Pattern/deviations: Decreased stride length, Step-through pattern, Wide base of support Gait velocity: decreased     General Gait Details: Supervision for safety   Stairs             Wheelchair Mobility     Tilt Bed    Modified Rankin (Stroke Patients Only)       Balance Overall balance assessment: Needs assistance Sitting-balance support: No upper extremity supported, Feet supported Sitting balance-[name]: Good     Standing balance support: During functional activity, No upper extremity supported Standing balance-[name]: Good Standing balance comment: Able to ambulate without UE support without LOB                            Communication Communication Communication: No apparent difficulties Factors Affecting Communication: [name] valve (pt independently dons/doffs)  Cognition Arousal: Alert Behavior During Therapy: WFL for tasks assessed/performed   PT - Cognitive impairments: No apparent impairments                         Following commands: Intact      Cueing Cueing Techniques: Verbal cues  Exercises      General Comments        Pertinent Vitals/Pain Pain Assessment Pain Assessment: No/denies pain    Home Living  Prior Function            PT Goals (current goals can now be found in the care plan section) Acute Rehab PT Goals Time For Goal Achievement: 11/24/23 Potential to Achieve Goals: Good Progress towards PT goals: Progressing toward goals    Frequency    Min 2X/week      PT Plan      Co-evaluation              AM-PAC PT "6 Clicks" Mobility   Outcome Measure  Help needed turning from your back to your side while in a flat bed without using bedrails?: None Help  needed moving from lying on your back to sitting on the side of a flat bed without using bedrails?: None Help needed moving to and from a bed to a chair (including a wheelchair)?: [name] Help needed standing up from a chair using your arms (e.g., wheelchair or bedside chair)?: [name] Help needed to walk in hospital room?: [name] Help needed climbing 3-5 steps with a railing? : A Lot 6 Click Score: 19    End of Session Equipment Utilized During Treatment: Oxygen  Activity Tolerance: Patient tolerated treatment well Patient left: with call bell/phone within reach;in chair Nurse Communication: Mobility status PT Visit Diagnosis: Unsteadiness on feet (R26.81);Other abnormalities of gait and mobility (R26.89);Repeated falls (R29.6);Muscle weakness (generalized) (M62.81);Pain Pain - Right/Left: Right Pain - part of body: Shoulder     Time: 1100-1130 PT Time Calculation (min) (ACUTE ONLY): 30 min  Charges:    $Therapeutic Activity: 23-37 mins PT General Charges $$ ACUTE PT VISIT: 1 Visit                     Angel Brewer, PT, DPT Acute Rehabilitation Services Office [phone]    Angel Brewer 11/10/2023, 1:34 PM

## 2023-11-10 NOTE — Telephone Encounter (Signed)
 Call returned to Angel Charters, RN CM and explained to her that the patient has been referred to [employer] for assistance with obtaining insurance coverage through the Marketplace.  Shelvy Dickens will check with the patient to inquire about the status of a Marketplace application

## 2023-11-10 NOTE — Progress Notes (Addendum)
 PROGRESS NOTE  Angel Brewer YNW:[id number] DOB: 01/16/1966 DOA: 10/22/2023 PCP: [name], NP   LOS: 18 days   Brief Narrative / Interim history: 58 year old female with chronic hypercarbic respiratory failure, trach dependent since May 2023, chronic diastolic CHF, OSA on CPAP at bedtime, PE and DVT on Eliquis , comes into the hospital with worsening respiratory status after mechanical fall with multiple fractures (right clavicle, left tibial plateau, L1 vertebral fractures).    Subjective: Patient denies any new complaints.  Has not heard anything about her home ventilator yet.  Assesement and Plan:  Acute on chronic hypoxic and hypercarbic respiratory failure, chronic tracheal stenosis status, chronic OSA/obesity hypoventilation syndrome  Patient was seen by pulmonology.  Was requiring ventilator in the ICU.  Has done well without ventilator use in the last 3 to 4 days.   Seen by pulmonology.  They think that considering her hypercapnia she will benefit from having ventilator during nighttime at home.  DME order has been placed by pulmonology.  TOC to pursue this further.   Patient mentions that she has oxygen  at home.  Mechanical fall, multiple fractures Including right clavicle, left tibial plateau, L1 vertebral fractures.   Trauma, neurosurgery, orthopedic surgery evaluated patient, for now continue conservative management.   Seems to have improved.  No longer requiring sling.  Has worked well with physical therapy.  Hypokalemia Remains persistently hypokalemic.  Patient refused IV potassium yesterday and also refuses to take oral potassium.  She was counseled.  She was told about possible cardiac arrhythmias. RN to reattempt. Check labs in AM. Furosemide  has been held.  Magnesium  is 2.1.  History of recurrent PE/DVT-continue Eliquis   Chronic diastolic CHF-seems euvolemic.  Persistent hypokalemia noted.  See above.  Holding furosemide .  New  spironolactone .  Essential hypertension-continue medications as below, blood pressure acceptable  Hyperlipidemia-continue statin  Obesity, morbid-BMI 62.5  Scheduled Meds:  amLODipine   5 mg Oral Daily   apixaban   5 mg Oral BID   atorvastatin   20 mg Oral Daily   Chlorhexidine  Gluconate Cloth  6 each Topical Daily   mouth rinse  15 mL Mouth Rinse 4 times per day   pantoprazole   40 mg Oral QHS   potassium chloride   40 mEq Oral Once   Followed by   potassium chloride   20 mEq Oral Once   sodium chloride  flush  3 mL Intravenous Q12H   spironolactone   25 mg Oral Daily   Continuous Infusions:   PRN Meds:.acetaminophen  (TYLENOL ) oral liquid 160 mg/5 mL, diclofenac  Sodium, docusate sodium , hydrALAZINE , ipratropium-albuterol , naLOXone  (NARCAN )  injection, ondansetron  **OR** ondansetron  (ZOFRAN ) IV, mouth rinse, polyethylene glycol, sodium chloride  flush  Current Outpatient Medications  Medication Instructions   amLODipine  (NORVASC ) 5 mg, Oral, Daily   apixaban  (ELIQUIS ) 5 mg, Oral, 2 times daily   atorvastatin  (LIPITOR) 20 mg, Oral, Daily   dapagliflozin  propanediol (FARXIGA ) 10 mg, Oral, Daily   furosemide  (LASIX ) 20 mg, Oral, Daily, Take 40 mg or 2 tablets for the next 5 days then resume 20 mg or 1 tablet by mouth daily by mouth.   Misc. Devices MISC Please supply patient with tracheostomy humidifier  Z93.0   PRESCRIPTION MEDICATION 1 each, Daily at bedtime   spironolactone  (ALDACTONE ) 25 mg, Oral, Daily    Diet Orders (From admission, onward)     Start     Ordered   10/29/23 1409  Diet regular Room service appropriate? Yes with Assist; Fluid consistency: Thin  Diet effective now       Question  Answer Comment  Room service appropriate? Yes with Assist   Fluid consistency: Thin      10/29/23 1409            DVT prophylaxis: On Eliquis    Code Status: Full Code Family Communication: No family at bedside  Consultants:  PCCM Orthopedic  surgery Trauma Neurosurgery  Objective: Vitals:   11/10/23 0500 11/10/23 0600 11/10/23 0700 11/10/23 0800  BP: (!) 117/59 127/61 124/70 126/72  Pulse: (!) 49 60 (!) 49 73  Resp: 15 16 16  (!) 21  Temp:    97.9 F (36.6 C)  TempSrc:    Oral  SpO2: 100% 100% 100% 96%  Weight: (!) 175 kg     Height:        Intake/Output Summary (Last 24 hours) at 11/10/2023 0937 Last data filed at 11/10/2023 0523 Gross per 24 hour  Intake --  Output 450 ml  Net -450 ml   Wt Readings from Last 3 Encounters:  11/10/23 (!) 175 kg  09/11/23 (!) 178.3 kg  06/17/23 (!) 185.5 kg    Examination:  Awake alert.  In no distress Lungs reveal coarse breath sounds bilaterally. Tracheostomy is noted. S1-S2 is normal regular. Abdomen is obese.  Nontender. No significant pedal edema noted.   Data Reviewed:   CBC Recent Labs  Lab 11/05/23 0638 11/08/23 0351  WBC 4.3  --   HGB 13.3 12.9  HCT 43.0 38.0  PLT 204  --   MCV 94.7  --   MCH 29.3  --   MCHC 30.9  --   RDW 14.8  --     Recent Labs  Lab 11/05/23 0638 11/07/23 0723 11/08/23 0351 11/08/23 0813 11/09/23 0634 11/10/23 0601  NA 137 137 136 137 138 140  K 3.6 3.1* 2.8* 3.2* 3.1* 3.1*  CL 90* 93*  --  94* 96* 97*  CO2 38* 36*  --  34* 34* 35*  GLUCOSE 100* 96  --  86 91 89  BUN 16 15  --  12 12 10   CREATININE 0.90 0.82  --  0.84 0.93 0.82  CALCIUM  9.5 9.0  --  9.1 8.9 8.7*  AST 13*  --   --   --   --   --   ALT 11  --   --   --   --   --   ALKPHOS 48  --   --   --   --   --   BILITOT 1.2  --   --   --   --   --   ALBUMIN 3.2*  --   --   --   --   --   MG 2.1  --   --  2.1  --  2.1    CBG: Recent Labs  Lab 11/03/23 1159 11/03/23 1629 11/04/23 1218 11/04/23 1530 11/07/23 0829  GLUCAP 88 77 78 110* 87    Radiology Studies: No results found.   Angel Brewer  [facility]

## 2023-11-11 LAB — BASIC METABOLIC PANEL WITH GFR
Anion gap: 7 (ref 5–15)
BUN: 13 mg/dL (ref 6–20)
CO2: 33 mmol/L — ABNORMAL HIGH (ref 22–32)
Calcium: 8.6 mg/dL — ABNORMAL LOW (ref 8.9–10.3)
Chloride: 98 mmol/L (ref 98–111)
Creatinine, Ser: 0.84 mg/dL (ref 0.44–1.00)
GFR, Estimated: >60 mL/min (ref >60)
Glucose, Bld: 87 mg/dL (ref 70–99)
Potassium: 2.9 mmol/L — ABNORMAL LOW (ref 3.5–5.1)
Sodium: 138 mmol/L (ref 135–145)

## 2023-11-11 MED ORDER — POTASSIUM CHLORIDE CRYS ER 20 MEQ PO TBCR
20.0000 meq | EXTENDED_RELEASE_TABLET | Freq: Two times a day (BID) | ORAL | Status: DC
  Filled 2023-11-11 (×3): qty 1

## 2023-11-11 NOTE — Progress Notes (Signed)
 PROGRESS NOTE  Angel Brewer AOZ:[id number] DOB: [birth year] DOA: 10/22/2023 PCP: [name], NP   LOS: 19 days   Brief Narrative / Interim history: 58 year old female with chronic hypercarbic respiratory failure, trach dependent since May 2023, chronic diastolic CHF, OSA on CPAP at bedtime, PE and DVT on Eliquis , comes into the hospital with worsening respiratory status after mechanical fall with multiple fractures (right clavicle, left tibial plateau, L1 vertebral fractures).    Subjective: No acute issues or events overnight denies nausea vomit diarrhea constipation any fevers chills or chest pain.  She is awaiting to hear about her home ventilator but otherwise eager to discharge home whenever possible and safe.  Assesement and Plan:  Acute on chronic hypoxic and hypercarbic respiratory failure, chronic tracheal stenosis status, chronic OSA/obesity hypoventilation syndrome  -Patient continues to tolerate ventilator quite well. - Previously attempted to wean patient off overnight ventilator but has remained markedly hypercarbic - thus continues to meet criteria for ongoing ventilator needs - Appreciate pulmonology follow-up/recommendations  Acute mechanical fall, multiple fractures Ambulatory dysfunction, improving Including right clavicle, left tibial plateau, L1 vertebral fractures.   Trauma, neurosurgery, orthopedic surgery evaluated patient, for now continue conservative management.   Seems to have improved.  No longer requiring sling.  Has worked well with physical therapy over the past week.  Hypokalemia Ongoing, potassium ordered twice daily x 3 days - Continue to hold Lasix   History of recurrent PE/DVT-continue Eliquis   Chronic diastolic CHF-holding furosemide  in the setting of hypokalemia, continue spironolactone  continue to increase dose as appropriate.  Essential hypertension-continue medications as below, blood pressure  acceptable  Hyperlipidemia-continue statin  Obesity, morbid-BMI 62.5  Scheduled Meds:  amLODipine   5 mg Oral Daily   apixaban   5 mg Oral BID   atorvastatin   20 mg Oral Daily   Chlorhexidine  Gluconate Cloth  6 each Topical Daily   mouth rinse  15 mL Mouth Rinse 4 times per day   pantoprazole   40 mg Oral QHS   potassium chloride   40 mEq Oral Once   Followed by   potassium chloride   20 mEq Oral Once   sodium chloride  flush  3 mL Intravenous Q12H   spironolactone   25 mg Oral Daily   Continuous Infusions:   PRN Meds:.acetaminophen  (TYLENOL ) oral liquid 160 mg/5 mL, diclofenac  Sodium, docusate sodium , hydrALAZINE , ipratropium-albuterol , naLOXone  (NARCAN )  injection, ondansetron  **OR** ondansetron  (ZOFRAN ) IV, mouth rinse, polyethylene glycol, sodium chloride  flush  Current Outpatient Medications  Medication Instructions   amLODipine  (NORVASC ) 5 mg, Oral, Daily   apixaban  (ELIQUIS ) 5 mg, Oral, 2 times daily   atorvastatin  (LIPITOR) 20 mg, Oral, Daily   dapagliflozin  propanediol (FARXIGA ) 10 mg, Oral, Daily   furosemide  (LASIX ) 20 mg, Oral, Daily, Take 40 mg or 2 tablets for the next 5 days then resume 20 mg or 1 tablet by mouth daily by mouth.   Misc. Devices MISC Please supply patient with tracheostomy humidifier  Z93.0   PRESCRIPTION MEDICATION 1 each, Daily at bedtime   spironolactone  (ALDACTONE ) 25 mg, Oral, Daily    Diet Orders (From admission, onward)     Start     Ordered   10/29/23 1409  Diet regular Room service appropriate? Yes with Assist; Fluid consistency: Thin  Diet effective now       Question Answer Comment  Room service appropriate? Yes with Assist   Fluid consistency: Thin      10/29/23 1409            DVT prophylaxis: On Eliquis   Code Status: Full Code Family Communication: No family at bedside  Consultants:  PCCM Orthopedic surgery Trauma Neurosurgery  Objective: Vitals:   11/11/23 0300 11/11/23 0322 11/11/23 0400 11/11/23 0500  BP:    105/76   Pulse: (!) 54 (!) 53 (!) 47 (!) 58  Resp: 20 (!) 28 13 13   Temp:   99.3 F (37.4 C)   TempSrc:   Oral   SpO2: 95% 95% 95% 93%  Weight:    (!) 175 kg  Height:        Intake/Output Summary (Last 24 hours) at 11/11/2023 0700 Last data filed at 11/10/2023 1300 Gross per 24 hour  Intake 480 ml  Output --  Net 480 ml   Wt Readings from Last 3 Encounters:  11/11/23 (!) 175 kg  09/11/23 (!) 178.3 kg  06/17/23 (!) 185.5 kg    Examination:  Awake alert.  In no distress Lungs reveal coarse breath sounds bilaterally. Tracheostomy is noted - clean dry intact S1-S2 is normal regular. Abdomen is obese.  Nontender. No significant pedal edema noted.   Data Reviewed:   CBC Recent Labs  Lab 11/05/23 0638 11/08/23 0351  WBC 4.3  --   HGB 13.3 12.9  HCT 43.0 38.0  PLT 204  --   MCV 94.7  --   MCH 29.3  --   MCHC 30.9  --   RDW 14.8  --     Recent Labs  Lab 11/05/23 0638 11/07/23 0723 11/08/23 0351 11/08/23 0813 11/09/23 0634 11/10/23 0601 11/11/23 0552  NA 137 137 136 137 138 140 138  K 3.6 3.1* 2.8* 3.2* 3.1* 3.1* 2.9*  CL 90* 93*  --  94* 96* 97* 98  CO2 38* 36*  --  34* 34* 35* 33*  GLUCOSE 100* 96  --  86 91 89 87  BUN 16 15  --  12 12 10 13   CREATININE 0.90 0.82  --  0.84 0.93 0.82 0.84  CALCIUM  9.5 9.0  --  9.1 8.9 8.7* 8.6*  AST 13*  --   --   --   --   --   --   ALT 11  --   --   --   --   --   --   ALKPHOS 48  --   --   --   --   --   --   BILITOT 1.2  --   --   --   --   --   --   ALBUMIN 3.2*  --   --   --   --   --   --   MG 2.1  --   --  2.1  --  2.1  --     CBG: Recent Labs  Lab 11/04/23 1218 11/04/23 1530 11/07/23 0829  GLUCAP 78 110* 87    Radiology Studies: No results found.  Time spent:  [name] DO  [facility]

## 2023-11-11 NOTE — Progress Notes (Signed)
 Occupational Therapy Treatment Patient Details Name: Angel Brewer MRN: [id number] DOB: October 13, 1965 Today's Date: 11/11/2023   History of present illness Pt is a 58 y.o. female presenting from home after a fall 4/10. Hypoxic and placed on NRB. Found to have acute superior endplate deformity of L1, R clavicular head fx, L displaced tibial plateau intercondylar fx. CTH with no acute findings, fx of L proximal humerus.  PMH significant for chronic hypoxic hypercarbic respiratory failure tracheostomy dependent since May 2023, HFpEF, OSA, PE and DVT on Eliquis , and morbid obesity (BMI 67)   OT comments  Pt requesting hygiene at sink level as her functional task for this session. Pt completed all anterior UB bathing in standing with good breath support. Pt demonstrates decreased activity tolerance but complete entire session in standing. Recommendation for [facility] for d/c planning.   Call me Katreena    If plan is discharge home, recommend the following:  A Gleed help with walking and/or transfers;A Matteucci help with bathing/dressing/bathroom;Assistance with cooking/housework;Help with stairs or ramp for entrance   Equipment Recommendations  BSC/3in1    Recommendations for Other Services Rehab consult    Precautions / Restrictions Precautions Precautions: Fall Precaution/Restrictions Comments: watch SpO2, on trach collar Required Braces or Orthoses: Sling (comfort) Restrictions Weight Bearing Restrictions Per Provider Order: No RUE Weight Bearing Per Provider Order: Non weight bearing LUE Weight Bearing Per Provider Order: Weight bearing as tolerated LLE Weight Bearing Per Provider Order: Weight bearing as tolerated Other Position/Activity Restrictions: Per ortho on 4/11: NWB but if she can tolerate using the RUE for transfers she is allowed       Mobility Bed Mobility Overal bed mobility: Needs Assistance Bed Mobility: Supine to Sit     Supine to sit: Mod assist, HOB elevated,  Used rails     General bed mobility comments: hob 50 degrees , unable to use R UE to push up . OT on L side and pt pulling with LUE. pt educated on exiting the bed in the future always on the L UE to try to help maximize her independence. pt encourage not use the bed rail since she has none at home. Pt does have HOB that elevates at home    Transfers Overall transfer level: Needs assistance   Transfers: Sit to/from Stand Sit to Stand: Min assist           General transfer comment: pulling with L UE     Balance                                           ADL either performed or assessed with clinical judgement   ADL Overall ADL's : Needs assistance/impaired Eating/Feeding: Modified independent   Grooming: Wash/dry face;Wash/dry hands;Applying deodorant;Supervision/safety   Upper Body Bathing: Minimal assistance;Standing Upper Body Bathing Details (indicate cue type and reason): needs help with back - uses brush at baseline. R UE painful for shoudler flexion Lower Body Bathing: Moderate assistance Lower Body Bathing Details (indicate cue type and reason): needs (A) for peri area and buttock. pt with incontinence of stool Upper Body Dressing : Minimal assistance       Toilet Transfer: Minimal assistance Toilet Transfer Details (indicate cue type and reason): pt pulling up from bed surface on therapist with L UE.         Functional mobility during ADLs: Contact guard assist  Extremity/Trunk Assessment              Vision       Restaurant manager, fast food Communication: No apparent difficulties Factors Affecting Communication: [name] valve   Cognition Arousal: Alert Behavior During Therapy: WFL for tasks assessed/performed Cognition: No apparent impairments                                        Cueing      Exercises      Shoulder Instructions       General Comments RA PMV  on. pt walking to bathroom with wide BOS and no RW.    Pertinent Vitals/ Pain       Pain Assessment Pain Assessment: No/denies pain Pain Intervention(s): Monitored during session  Home Living                                          Prior Functioning/Environment              Frequency  Min 3X/week        Progress Toward Goals  OT Goals(current goals can now be found in the care plan section)  Progress towards OT goals: Progressing toward goals  Acute Rehab OT Goals Patient Stated Goal: to get my strength back OT Goal Formulation: With patient Time For Goal Achievement: 11/24/23 Potential to Achieve Goals: Good ADL Goals Pt Will Perform Grooming: with modified independence;standing Pt Will Perform Lower Body Dressing: with modified independence;sit to/from stand;with adaptive equipment Pt Will Transfer to Toilet: ambulating;with modified independence Pt/caregiver will Perform Home Exercise Program: Increased strength;With written HEP provided;With Supervision  Plan      Co-evaluation                 AM-PAC OT "6 Clicks" Daily Activity     Outcome Measure   Help from another person eating meals?: None Help from another person taking care of personal grooming?: A Seel Help from another person toileting, which includes using toliet, bedpan, or urinal?: A Lot Help from another person bathing (including washing, rinsing, drying)?: A Lot Help from another person to put on and taking off regular upper body clothing?: A Lot Help from another person to put on and taking off regular lower body clothing?: A Lot 6 Click Score: 15    End of Session    OT Visit Diagnosis: Unsteadiness on feet (R26.81);Muscle weakness (generalized) (M62.81);History of falling (Z91.81);Pain Pain - Right/Left: Right Pain - part of body: Shoulder   Activity Tolerance Patient tolerated treatment well   Patient Left in chair;with call bell/phone within reach    Nurse Communication Mobility status;Precautions        Time: 8295-6213 OT Time Calculation (min): 16 min  Charges: OT General Charges $OT Visit: 1 Visit OT Treatments $Self Care/Home Management : 8-22 mins   Brynn, OTR/L  Acute Rehabilitation Services Office: [phone] .   Neomia Banner 11/11/2023, 12:28 PM

## 2023-11-12 NOTE — TOC Progression Note (Addendum)
 Transition of Care [facility]) - Progression Note    Patient Details  Name: Angel Brewer MRN: [id number] Date of Birth: Dec 01, 1965  [facility] CM/SW Contact  Ronni Colace, RN Phone Number: 11/12/2023, 1:08 PM  Clinical Narrative:     Sent information over to [facility] for NIV to check on insurance and approval of NIV. And see if new insurance is active yet.  Awaiting response  DW Dr Rudine Cos [address] has no insurance on file. Visited patient, however she was walking with PT will  revisit soon to discuss what insurance she chose 1500 Met with patient at bedside, introduced self and role. The patient stated she looked at different plans, but none would pay for trelogy, her medications were 45 dollars a script and that was after the deductible/  She states when she got disability, they stated she would get Medicare but it will not be until November or so.  Passed this information on to the team Discussed with [employer]- Jermaine, he will be following up tomorrow..  Expected Discharge Plan: IP Rehab Facility    Expected Discharge Plan and Services        Home with NIV                                       Social Determinants of Health (SDOH) Interventions SDOH Screenings   Food Insecurity: No Food Insecurity (10/26/2023)  Housing: Low Risk  (10/26/2023)  Transportation Needs: No Transportation Needs (10/26/2023)  Utilities: Not At Risk (10/26/2023)  Depression (PHQ2-9): Low Risk  (06/17/2023)  Financial Resource Strain: Medium Risk (06/16/2023)  Physical Activity: Sufficiently Active (06/16/2023)  Social Connections: Socially Isolated (06/16/2023)  Stress: No Stress Concern Present (06/16/2023)  Tobacco Use: Medium Risk (10/23/2023)  Health Literacy: Adequate Health Literacy (06/17/2023)    Readmission Risk Interventions    09/03/2023    1:17 PM 12/20/2021   11:33 AM  Readmission Risk Prevention Plan  Post Dischage Appt Complete   Medication Screening  Complete   Transportation Screening Complete Complete  PCP or Specialist Appt within 3-5 Days  Complete  HRI or Home Care Consult  Complete  Social Work Consult for Recovery Care Planning/Counseling  Complete  Palliative Care Screening  Not Applicable  Medication Review Oceanographer)  Complete

## 2023-11-12 NOTE — Progress Notes (Signed)
 Physical Therapy Treatment Patient Details Name: Angel Brewer MRN: [id number] DOB: 22-May-1966 Today's Date: 11/12/2023   History of Present Illness Pt is a 58 y.o. female presenting from home after a fall 4/10. Hypoxic and placed on NRB. Found to have acute superior endplate deformity of L1, R clavicular head fx, L displaced tibial plateau intercondylar fx. CTH with no acute findings, fx of L proximal humerus.  PMH significant for chronic hypoxic hypercarbic respiratory failure tracheostomy dependent since May 2023, HFpEF, OSA, PE and DVT on Eliquis , and morbid obesity (BMI 67)    PT Comments  Pt making excellent progress towards her physical therapy goals. Pt ambulating 600 ft with no assistive device or physical assist; pt reporting, "this is the most I've walked in years." Worked on serial sit to stands for LE strength and power. Will continue to follow acutely to address cardiopulmonary endurance and functional mobility.     If plan is discharge home, recommend the following: Assistance with cooking/housework;Assist for transportation;Help with stairs or ramp for entrance;A Hopwood help with walking and/or transfers;A Hinchey help with bathing/dressing/bathroom   Can travel by private vehicle        Equipment Recommendations  None recommended by PT    Recommendations for Other Services       Precautions / Restrictions Precautions Precautions: Fall Recall of Precautions/Restrictions: Intact Precaution/Restrictions Comments: watch SpO2, on trach collar Restrictions Weight Bearing Restrictions Per Provider Order: No RUE Weight Bearing Per Provider Order: Non weight bearing LUE Weight Bearing Per Provider Order: Weight bearing as tolerated LLE Weight Bearing Per Provider Order: Weight bearing as tolerated Other Position/Activity Restrictions: Per ortho on 4/11: NWB but if she can tolerate using the RUE for transfers she is allowed     Mobility  Bed Mobility                General bed mobility comments: OOB in chair upon entry    Transfers Overall transfer level: Modified independent Equipment used: None               General transfer comment: use of momentum to rise    Ambulation/Gait Ambulation/Gait assistance: Supervision Gait Distance (Feet): 600 Feet Assistive device: None Gait Pattern/deviations: Decreased stride length, Step-through pattern, Wide base of support       General Gait Details: Supervision for safety/equipment   Stairs             Wheelchair Mobility     Tilt Bed    Modified Rankin (Stroke Patients Only)       Balance Overall balance assessment: Needs assistance Sitting-balance support: No upper extremity supported, Feet supported Sitting balance-[name]: Good     Standing balance support: During functional activity, No upper extremity supported Standing balance-[name]: Good                              Communication Communication Communication: No apparent difficulties  Cognition Arousal: Alert Behavior During Therapy: WFL for tasks assessed/performed   PT - Cognitive impairments: No apparent impairments                         Following commands: Intact      Cueing Cueing Techniques: Verbal cues  Exercises Other Exercises Other Exercises: x5 serial sit to stands    General Comments        Pertinent Vitals/Pain Pain Assessment Pain Assessment: Faces Faces Pain Scale: Hurts a  Duggar bit Pain Location: R shoulder Pain Descriptors / Indicators: Discomfort Pain Intervention(s): Monitored during session    Home Living                          Prior Function            PT Goals (current goals can now be found in the care plan section) Acute Rehab PT Goals Patient Stated Goal: return to independence Potential to Achieve Goals: Good Progress towards PT goals: Progressing toward goals    Frequency    Min 2X/week      PT Plan       Co-evaluation              AM-PAC PT "6 Clicks" Mobility   Outcome Measure  Help needed turning from your back to your side while in a flat bed without using bedrails?: None Help needed moving from lying on your back to sitting on the side of a flat bed without using bedrails?: None Help needed moving to and from a bed to a chair (including a wheelchair)?: None Help needed standing up from a chair using your arms (e.g., wheelchair or bedside chair)?: None Help needed to walk in hospital room?: A Buckner Help needed climbing 3-5 steps with a railing? : A Vue 6 Click Score: 22    End of Session Equipment Utilized During Treatment: Oxygen  Activity Tolerance: Patient tolerated treatment well Patient left: in chair;with call bell/phone within reach Nurse Communication: Mobility status PT Visit Diagnosis: Unsteadiness on feet (R26.81);Other abnormalities of gait and mobility (R26.89);Repeated falls (R29.6);Muscle weakness (generalized) (M62.81);Pain Pain - Right/Left: Right Pain - part of body: Shoulder     Time: 1610-9604 PT Time Calculation (min) (ACUTE ONLY): 34 min  Charges:    $Therapeutic Activity: 23-37 mins PT General Charges $$ ACUTE PT VISIT: 1 Visit                     [name], PT, DPT Acute Rehabilitation Services Office [phone]    [name] 11/12/2023, 4:39 PM

## 2023-11-12 NOTE — Progress Notes (Signed)
 PROGRESS NOTE  Angel Brewer [id number] DOB: 1965-09-11 DOA: 10/22/2023 PCP: [name], NP   LOS: 20 days   Brief Narrative / Interim history: 58 year old female with chronic hypercarbic respiratory failure, trach dependent since May 2023, chronic diastolic CHF, OSA on CPAP at bedtime, PE and DVT on Eliquis , comes into the hospital with worsening respiratory status after mechanical fall with multiple fractures (right clavicle, left tibial plateau, L1 vertebral fractures).    Subjective: No acute issues or events overnight denies nausea vomit diarrhea constipation any fevers chills or chest pain.  She is awaiting to hear about her home ventilator but otherwise eager to discharge home whenever possible and safe.  Assesement and Plan:  Acute on chronic hypoxic and hypercarbic respiratory failure, chronic tracheal stenosis status, chronic OSA/obesity hypoventilation syndrome  -Patient continues to tolerate ventilator quite well. - Previously attempted to wean patient off overnight ventilator but has remained markedly hypercarbic - thus continues to meet criteria for ongoing ventilator needs - Appreciate pulmonology follow-up/recommendations  Acute mechanical fall, multiple fractures Ambulatory dysfunction, improving Including right clavicle, left tibial plateau, L1 vertebral fractures.   Trauma, neurosurgery, orthopedic surgery evaluated patient, for now continue conservative management.   Seems to have improved.  No longer requiring sling.  Has worked well with physical therapy over the past week.  Hypokalemia Ongoing, potassium ordered twice daily x 3 days - unfortunately she continues to refuse - recommend increased fruit intake over the next few days. Repeat labs pending. -Spironolactone  ongoing - cannot increase dose due to borderline BP - Continue to hold Lasix   History of recurrent PE/DVT-continue Eliquis   Chronic diastolic CHF-holding furosemide  in the setting  of hypokalemia, continue spironolactone  continue to increase dose as appropriate.  Essential hypertension-continue medications as below, blood pressure acceptable  Hyperlipidemia-continue statin  Obesity, morbid-BMI 62.5  Scheduled Meds:  amLODipine   5 mg Oral Daily   apixaban   5 mg Oral BID   atorvastatin   20 mg Oral Daily   Chlorhexidine  Gluconate Cloth  6 each Topical Daily   mouth rinse  15 mL Mouth Rinse 4 times per day   pantoprazole   40 mg Oral QHS   potassium chloride   20 mEq Oral BID   sodium chloride  flush  3 mL Intravenous Q12H   spironolactone   25 mg Oral Daily   Continuous Infusions:   PRN Meds:.acetaminophen  (TYLENOL ) oral liquid 160 mg/5 mL, diclofenac  Sodium, docusate sodium , hydrALAZINE , ipratropium-albuterol , naLOXone  (NARCAN )  injection, ondansetron  **OR** ondansetron  (ZOFRAN ) IV, mouth rinse, polyethylene glycol, sodium chloride  flush  Current Outpatient Medications  Medication Instructions   amLODipine  (NORVASC ) 5 mg, Oral, Daily   apixaban  (ELIQUIS ) 5 mg, Oral, 2 times daily   atorvastatin  (LIPITOR) 20 mg, Oral, Daily   dapagliflozin  propanediol (FARXIGA ) 10 mg, Oral, Daily   furosemide  (LASIX ) 20 mg, Oral, Daily, Take 40 mg or 2 tablets for the next 5 days then resume 20 mg or 1 tablet by mouth daily by mouth.   Misc. Devices MISC Please supply patient with tracheostomy humidifier  Z93.0   PRESCRIPTION MEDICATION 1 each, Daily at bedtime   spironolactone  (ALDACTONE ) 25 mg, Oral, Daily    Diet Orders (From admission, onward)     Start     Ordered   10/29/23 1409  Diet regular Room service appropriate? Yes with Assist; Fluid consistency: Thin  Diet effective now       Question Answer Comment  Room service appropriate? Yes with Assist   Fluid consistency: Thin      10/29/23 1409  DVT prophylaxis: On Eliquis    Code Status: Full Code Family Communication: No family at bedside  Consultants:  PCCM Orthopedic  surgery Trauma Neurosurgery  Objective: Vitals:   11/12/23 0405 11/12/23 0406 11/12/23 0500 11/12/23 0600  BP:      Pulse: (!) 46  (!) 59 (!) 58  Resp: 18  19 19   Temp:      TempSrc:      SpO2: 97%  95% 100%  Weight:  (!) 175.6 kg    Height:        Intake/Output Summary (Last 24 hours) at 11/12/2023 0657 Last data filed at 11/11/2023 2116 Gross per 24 hour  Intake 3 ml  Output --  Net 3 ml   Wt Readings from Last 3 Encounters:  11/12/23 (!) 175.6 kg  09/11/23 (!) 178.3 kg  06/17/23 (!) 185.5 kg    Examination:  Awake alert.  In no distress Lungs reveal coarse breath sounds bilaterally. Tracheostomy is noted - clean dry intact S1-S2 is normal regular. Abdomen is obese.  Nontender. No significant pedal edema noted.   Data Reviewed:   CBC Recent Labs  Lab 11/08/23 0351  HGB 12.9  HCT 38.0    Recent Labs  Lab 11/07/23 0723 11/08/23 0351 11/08/23 0813 11/09/23 0634 11/10/23 0601 11/11/23 0552  NA 137 136 137 138 140 138  K 3.1* 2.8* 3.2* 3.1* 3.1* 2.9*  CL 93*  --  94* 96* 97* 98  CO2 36*  --  34* 34* 35* 33*  GLUCOSE 96  --  86 91 89 87  BUN 15  --  12 12 10 13   CREATININE 0.82  --  0.84 0.93 0.82 0.84  CALCIUM  9.0  --  9.1 8.9 8.7* 8.6*  MG  --   --  2.1  --  2.1  --     CBG: Recent Labs  Lab 11/07/23 0829  GLUCAP 87    Radiology Studies: No results found.  Time spent:  [name] DO  [facility]

## 2023-11-13 MED ORDER — POTASSIUM CHLORIDE 20 MEQ PO PACK
20.0000 meq | PACK | Freq: Two times a day (BID) | ORAL | Status: AC
  Filled 2023-11-13: qty 1

## 2023-11-13 NOTE — Progress Notes (Signed)
 PROGRESS NOTE  Angel Brewer [id number] DOB: [birth year] DOA: 10/22/2023 PCP: [name], NP   LOS: 21 days   Brief Narrative / Interim history: 58 year old female with chronic hypercarbic respiratory failure, trach dependent since May 2023, chronic diastolic CHF, OSA on CPAP at bedtime, PE and DVT on Eliquis , comes into the hospital with worsening respiratory status after mechanical fall with multiple fractures (right clavicle, left tibial plateau, L1 vertebral fractures).    Subjective: No acute issues or events overnight denies nausea vomit diarrhea constipation any fevers chills or chest pain.  She is awaiting to hear about her home ventilator but otherwise eager to discharge home whenever possible and safe.  Assesement and Plan:  Acute on chronic hypoxic and hypercarbic respiratory failure, chronic tracheal stenosis status, chronic OSA/obesity hypoventilation syndrome  -Patient continues to tolerate ventilator quite well. - Previously attempted to wean patient off overnight ventilator but has remained markedly hypercarbic - thus continues to meet criteria for ongoing ventilator needs - Appreciate pulmonology follow-up/recommendations  Acute mechanical fall, multiple fractures Ambulatory dysfunction, improving Including right clavicle, left tibial plateau, L1 vertebral fractures.   Trauma, neurosurgery, orthopedic surgery evaluated patient, for now continue conservative management.   Seems to have improved.  No longer requiring sling.  Has worked well with physical therapy over the past week.  Hypokalemia Ongoing, potassium ordered twice daily x 3 days - unfortunately she continues to refuse - recommend increased fruit intake over the next few days. Repeat labs pending. -Spironolactone  ongoing - cannot increase dose due to borderline BP - Continue to hold Lasix   History of recurrent PE/DVT-continue Eliquis   Chronic diastolic CHF-holding furosemide  in the setting  of hypokalemia, continue spironolactone  continue to increase dose as appropriate.  Essential hypertension-continue medications as below, blood pressure acceptable  Hyperlipidemia-continue statin  Obesity, morbid-BMI 62.5  Scheduled Meds:  amLODipine   5 mg Oral Daily   apixaban   5 mg Oral BID   atorvastatin   20 mg Oral Daily   Chlorhexidine  Gluconate Cloth  6 each Topical Daily   mouth rinse  15 mL Mouth Rinse 4 times per day   pantoprazole   40 mg Oral QHS   potassium chloride   20 mEq Oral BID   sodium chloride  flush  3 mL Intravenous Q12H   spironolactone   25 mg Oral Daily   Continuous Infusions:   PRN Meds:.acetaminophen  (TYLENOL ) oral liquid 160 mg/5 mL, diclofenac  Sodium, docusate sodium , hydrALAZINE , ipratropium-albuterol , naLOXone  (NARCAN )  injection, ondansetron  **OR** ondansetron  (ZOFRAN ) IV, mouth rinse, polyethylene glycol, sodium chloride  flush  Current Outpatient Medications  Medication Instructions   amLODipine  (NORVASC ) 5 mg, Oral, Daily   apixaban  (ELIQUIS ) 5 mg, Oral, 2 times daily   atorvastatin  (LIPITOR) 20 mg, Oral, Daily   dapagliflozin  propanediol (FARXIGA ) 10 mg, Oral, Daily   furosemide  (LASIX ) 20 mg, Oral, Daily, Take 40 mg or 2 tablets for the next 5 days then resume 20 mg or 1 tablet by mouth daily by mouth.   Misc. Devices MISC Please supply patient with tracheostomy humidifier  Z93.0   PRESCRIPTION MEDICATION 1 each, Daily at bedtime   spironolactone  (ALDACTONE ) 25 mg, Oral, Daily    Diet Orders (From admission, onward)     Start     Ordered   10/29/23 1409  Diet regular Room service appropriate? Yes with Assist; Fluid consistency: Thin  Diet effective now       Question Answer Comment  Room service appropriate? Yes with Assist   Fluid consistency: Thin      10/29/23 1409  DVT prophylaxis: On Eliquis    Code Status: Full Code Family Communication: No family at bedside  Consultants:  PCCM Orthopedic  surgery Trauma Neurosurgery  Objective: Vitals:   11/13/23 0310 11/13/23 0400 11/13/23 0500 11/13/23 0600  BP:      Pulse: (!) 56 (!) 48 (!) 47 81  Resp: 17 16 14 16   Temp:  98.2 F (36.8 C)    TempSrc:  Oral    SpO2: 97% 96% 96% 92%  Weight:      Height:        Intake/Output Summary (Last 24 hours) at 11/13/2023 0715 Last data filed at 11/12/2023 1800 Gross per 24 hour  Intake 400 ml  Output --  Net 400 ml   Wt Readings from Last 3 Encounters:  11/12/23 (!) 175.6 kg  09/11/23 (!) 178.3 kg  06/17/23 (!) 185.5 kg    Examination:  Awake alert.  In no distress Lungs reveal coarse breath sounds bilaterally. Tracheostomy is noted - clean dry intact S1-S2 is normal regular. Abdomen is obese.  Nontender. No significant pedal edema noted.   Data Reviewed:   CBC Recent Labs  Lab 11/08/23 0351  HGB 12.9  HCT 38.0    Recent Labs  Lab 11/07/23 0723 11/08/23 0351 11/08/23 0813 11/09/23 0634 11/10/23 0601 11/11/23 0552  NA 137 136 137 138 140 138  K 3.1* 2.8* 3.2* 3.1* 3.1* 2.9*  CL 93*  --  94* 96* 97* 98  CO2 36*  --  34* 34* 35* 33*  GLUCOSE 96  --  86 91 89 87  BUN 15  --  12 12 10 13   CREATININE 0.82  --  0.84 0.93 0.82 0.84  CALCIUM  9.0  --  9.1 8.9 8.7* 8.6*  MG  --   --  2.1  --  2.1  --     CBG: Recent Labs  Lab 11/07/23 0829  GLUCAP 87    Radiology Studies: No results found.  Time spent:  [name] DO  [facility]

## 2023-11-13 NOTE — TOC Progression Note (Addendum)
 Transition of Care [facility]) - Progression Note    Patient Details  Name: Angel Brewer MRN: [id number] Date of Birth: 13-Sep-1965  Transition of Care [facility]) CM/SW Contact  Ronni Colace, RN Phone Number: 11/13/2023, 10:31 AM  Clinical Narrative:    Eldora Greet to Jermaine from [place], they will not be able to do a LOG for a trelogy/vent. Called [employer] and spoke to [place], they cannot do LOG for vent, private pay is very expensive, Messaged team regarding this. Spoke to [employer], Supervisor regarding unable to order with LOG from either company.  Checked with Courtenay Distel from [employer], gave him information, he will get with his boss and see if they can assist at all.  1120 [employer] called back, leadership reviewed cannot offer a LOG 1140 Spoke with billing to  see if they could identify any payor source. They have sent her information over to first source for reapplication of Medicaid. Anell Baptist is her representative. Her number is [phone]. However she is off today. Spoke to [place] [phone] who read the notes and stated she makes too much money for Medicaid. Medicare is 7 months off of initial application but will retroactively pay from day of application. This still does not obtain her needed Trelogy for home. Expected Discharge Plan: IP Rehab Facility    Expected Discharge Plan and Services                                               Social Determinants of Health (SDOH) Interventions SDOH Screenings   Food Insecurity: No Food Insecurity (10/26/2023)  Housing: Low Risk  (10/26/2023)  Transportation Needs: No Transportation Needs (10/26/2023)  Utilities: Not At Risk (10/26/2023)  Depression (PHQ2-9): Low Risk  (06/17/2023)  Financial Resource Strain: Medium Risk (06/16/2023)  Physical Activity: Sufficiently Active (06/16/2023)  Social Connections: Socially Isolated (06/16/2023)  Stress: No Stress Concern Present (06/16/2023)  Tobacco Use: Medium Risk (10/23/2023)  Health Literacy:  Adequate Health Literacy (06/17/2023)    Readmission Risk Interventions    09/03/2023    1:17 PM 12/20/2021   11:33 AM  Readmission Risk Prevention Plan  Post Dischage Appt Complete   Medication Screening Complete   Transportation Screening Complete Complete  PCP or Specialist Appt within 3-5 Days  Complete  HRI or Home Care Consult  Complete  Social Work Consult for Recovery Care Planning/Counseling  Complete  Palliative Care Screening  Not Applicable  Medication Review [employer])  Complete

## 2023-11-14 LAB — BASIC METABOLIC PANEL WITH GFR
Anion gap: 9 (ref 5–15)
BUN: 11 mg/dL (ref 6–20)
CO2: 32 mmol/L (ref 22–32)
Calcium: 8.9 mg/dL (ref 8.9–10.3)
Chloride: 100 mmol/L (ref 98–111)
Creatinine, Ser: 0.85 mg/dL (ref 0.44–1.00)
GFR, Estimated: >60 mL/min (ref >60)
Glucose, Bld: 85 mg/dL (ref 70–99)
Potassium: 3.4 mmol/L — ABNORMAL LOW (ref 3.5–5.1)
Sodium: 141 mmol/L (ref 135–145)

## 2023-11-14 NOTE — Progress Notes (Addendum)
 PROGRESS NOTE  Angel Brewer [id number] DOB: May 03, 1966 DOA: 10/22/2023 PCP: [name], NP   LOS: 22 days   Brief Narrative / Interim history: 58 year old female with chronic hypercarbic respiratory failure, trach dependent since May 2023, chronic diastolic CHF, OSA on CPAP at bedtime, PE and DVT on Eliquis , comes into the hospital with worsening respiratory status after mechanical fall with multiple fractures (right clavicle, left tibial plateau, L1 vertebral fractures).    Subjective: No acute issues or events overnight denies nausea vomit diarrhea constipation any fevers chills or chest pain.  She is awaiting to hear about her home ventilator but otherwise eager to discharge home whenever possible and safe.  Assesement and Plan:  Acute on chronic hypoxic and hypercarbic respiratory failure, chronic tracheal stenosis status, chronic OSA/obesity hypoventilation syndrome  -Patient continues to tolerate ventilator quite well. - Previously attempted to wean patient off overnight ventilator but has remained markedly hypercarbic - thus continues to meet criteria for ongoing ventilator needs - Appreciate pulmonology follow-up/recommendations  Acute mechanical fall, multiple fractures Ambulatory dysfunction, improving Including right clavicle, left tibial plateau, L1 vertebral fractures.   Trauma, neurosurgery, orthopedic surgery evaluated patient, for now continue conservative management.   Seems to have improved.  No longer requiring sling.  Has worked well with physical therapy over the past week.  Hypokalemia Ongoing, potassium ordered twice daily x 3 days - unfortunately she continues to refuse - recommend increased fruit intake over the next few days. Repeat labs pending. -Spironolactone  ongoing - cannot increase dose due to borderline BP - Continue to hold Lasix   History of recurrent PE/DVT-continue Eliquis   Chronic diastolic CHF-holding furosemide  in the setting  of hypokalemia, continue spironolactone  continue to increase dose as appropriate.  Essential hypertension-continue medications as below, blood pressure acceptable  Hyperlipidemia-continue statin  Obesity, morbid-BMI 62.5  Scheduled Meds:  amLODipine   5 mg Oral Daily   apixaban   5 mg Oral BID   atorvastatin   20 mg Oral Daily   Chlorhexidine  Gluconate Cloth  6 each Topical Daily   mouth rinse  15 mL Mouth Rinse 4 times per day   pantoprazole   40 mg Oral QHS   potassium chloride   20 mEq Oral BID   sodium chloride  flush  3 mL Intravenous Q12H   spironolactone   25 mg Oral Daily   Continuous Infusions:   PRN Meds:.acetaminophen  (TYLENOL ) oral liquid 160 mg/5 mL, diclofenac  Sodium, docusate sodium , hydrALAZINE , ipratropium-albuterol , naLOXone  (NARCAN )  injection, ondansetron  **OR** ondansetron  (ZOFRAN ) IV, mouth rinse, polyethylene glycol, sodium chloride  flush  Current Outpatient Medications  Medication Instructions   amLODipine  (NORVASC ) 5 mg, Oral, Daily   apixaban  (ELIQUIS ) 5 mg, Oral, 2 times daily   atorvastatin  (LIPITOR) 20 mg, Oral, Daily   dapagliflozin  propanediol (FARXIGA ) 10 mg, Oral, Daily   furosemide  (LASIX ) 20 mg, Oral, Daily, Take 40 mg or 2 tablets for the next 5 days then resume 20 mg or 1 tablet by mouth daily by mouth.   Misc. Devices MISC Please supply patient with tracheostomy humidifier  Z93.0   PRESCRIPTION MEDICATION 1 each, Daily at bedtime   spironolactone  (ALDACTONE ) 25 mg, Oral, Daily    Diet Orders (From admission, onward)     Start     Ordered   10/29/23 1409  Diet regular Room service appropriate? Yes with Assist; Fluid consistency: Thin  Diet effective now       Question Answer Comment  Room service appropriate? Yes with Assist   Fluid consistency: Thin      10/29/23 1409  DVT prophylaxis: On Eliquis    Code Status: Full Code Family Communication: No family at bedside  Consultants:  PCCM Orthopedic  surgery Trauma Neurosurgery  Objective: Vitals:   11/14/23 0105 11/14/23 0400 11/14/23 0500 11/14/23 0620  BP:    114/67  Pulse: 62 (!) 54  64  Resp: 15 16  (!) 21  Temp:  97.6 F (36.4 C)    TempSrc:  Oral    SpO2: 99% 93%  98%  Weight:   (!) 170.5 kg   Height:        Intake/Output Summary (Last 24 hours) at 11/14/2023 0744 Last data filed at 11/13/2023 2231 Gross per 24 hour  Intake 790 ml  Output --  Net 790 ml   Wt Readings from Last 3 Encounters:  11/14/23 (!) 170.5 kg  09/11/23 (!) 178.3 kg  06/17/23 (!) 185.5 kg    Examination:  Awake alert.  In no distress Lungs reveal coarse breath sounds bilaterally. Tracheostomy is noted - clean dry intact S1-S2 is normal regular. Abdomen is obese.  Nontender. No significant pedal edema noted.   Data Reviewed:   CBC Recent Labs  Lab 11/08/23 0351  HGB 12.9  HCT 38.0    Recent Labs  Lab 11/08/23 0351 11/08/23 0813 11/09/23 0634 11/10/23 0601 11/11/23 0552  NA 136 137 138 140 138  K 2.8* 3.2* 3.1* 3.1* 2.9*  CL  --  94* 96* 97* 98  CO2  --  34* 34* 35* 33*  GLUCOSE  --  86 91 89 87  BUN  --  12 12 10 13   CREATININE  --  0.84 0.93 0.82 0.84  CALCIUM   --  9.1 8.9 8.7* 8.6*  MG  --  2.1  --  2.1  --     CBG: Recent Labs  Lab 11/07/23 0829  GLUCAP 87    Radiology Studies: No results found.  Time spent:  [name] DO  [facility]

## 2023-11-15 NOTE — Progress Notes (Signed)
 PROGRESS NOTE  Angel Brewer Angel Brewer DOB: 1965-07-24 DOA: 10/22/2023 PCP: [name], NP   LOS: 23 days   Brief Narrative / Interim history: 58 year old female with chronic hypercarbic respiratory failure, trach dependent since May 2023, chronic diastolic CHF, OSA on CPAP at bedtime, PE and DVT on Eliquis , comes into the hospital with worsening respiratory status after mechanical fall with multiple fractures (right clavicle, left tibial plateau, L1 vertebral fractures).    Subjective: No acute issues or events overnight denies nausea vomit diarrhea constipation any fevers chills or chest pain.  She is awaiting to hear about her home ventilator but otherwise eager to discharge home whenever possible and safe.  Assesement and Plan:  Acute on chronic hypoxic and hypercarbic respiratory failure, chronic tracheal stenosis status, chronic OSA/obesity hypoventilation syndrome  -Patient continues to tolerate ventilator quite well. - Previously attempted to wean patient off overnight ventilator but has remained markedly hypercarbic - thus continues to meet criteria for ongoing ventilator needs - Appreciate pulmonology follow-up/recommendations  Acute mechanical fall, multiple fractures Ambulatory dysfunction, improving Including right clavicle, left tibial plateau, L1 vertebral fractures.   Trauma, neurosurgery, orthopedic surgery evaluated patient, for now continue conservative management.   Seems to have improved.  No longer requiring sling.  Has worked well with physical therapy over the past week.  Hypokalemia Ongoing, potassium ordered twice daily x 3 days - unfortunately she continues to refuse - recommend increased fruit intake over the next few days. Repeat labs pending. -Spironolactone  ongoing - cannot increase dose due to borderline BP - Continue to hold Lasix   History of recurrent PE/DVT-continue Eliquis   Chronic diastolic CHF-holding furosemide  in the setting  of hypokalemia, continue spironolactone  continue to increase dose as appropriate.  Essential hypertension-continue medications as below, blood pressure acceptable  Hyperlipidemia-continue statin  Obesity, morbid-BMI 62.5  Scheduled Meds:  amLODipine   5 mg Oral Daily   apixaban   5 mg Oral BID   atorvastatin   20 mg Oral Daily   Chlorhexidine  Gluconate Cloth  6 each Topical Daily   mouth rinse  15 mL Mouth Rinse 4 times per day   pantoprazole   40 mg Oral QHS   sodium chloride  flush  3 mL Intravenous Q12H   spironolactone   25 mg Oral Daily   Continuous Infusions:   PRN Meds:.acetaminophen  (TYLENOL ) oral liquid 160 mg/5 mL, diclofenac  Sodium, docusate sodium , hydrALAZINE , ipratropium-albuterol , naLOXone  (NARCAN )  injection, ondansetron  **OR** ondansetron  (ZOFRAN ) IV, mouth rinse, polyethylene glycol, sodium chloride  flush  Current Outpatient Medications  Medication Instructions   amLODipine  (NORVASC ) 5 mg, Oral, Daily   apixaban  (ELIQUIS ) 5 mg, Oral, 2 times daily   atorvastatin  (LIPITOR) 20 mg, Oral, Daily   dapagliflozin  propanediol (FARXIGA ) 10 mg, Oral, Daily   furosemide  (LASIX ) 20 mg, Oral, Daily, Take 40 mg or 2 tablets for the next 5 days then resume 20 mg or 1 tablet by mouth daily by mouth.   Misc. Devices MISC Please supply patient with tracheostomy humidifier  Z93.0   PRESCRIPTION MEDICATION 1 each, Daily at bedtime   spironolactone  (ALDACTONE ) 25 mg, Oral, Daily    Diet Orders (From admission, onward)     Start     Ordered   10/29/23 1409  Diet regular Room service appropriate? Yes with Assist; Fluid consistency: Thin  Diet effective now       Question Answer Comment  Room service appropriate? Yes with Assist   Fluid consistency: Thin      10/29/23 1409  DVT prophylaxis: On Eliquis    Code Status: Full Code Family Communication: No family at bedside  Consultants:  PCCM Orthopedic surgery Trauma Neurosurgery  Objective: Vitals:   11/15/23  0400 11/15/23 0500 11/15/23 0630 11/15/23 0745  BP: 117/76     Pulse: (!) 53  (!) 58 (!) 50  Resp: 18  14 14   Temp: 97.8 F (36.6 C)     TempSrc: Axillary     SpO2: 97%  96% 99%  Weight:  (!) 170.5 kg    Height:       No intake or output data in the 24 hours ending 11/15/23 0752  Wt Readings from Last 3 Encounters:  11/15/23 (!) 170.5 kg  09/11/23 (!) 178.3 kg  06/17/23 (!) 185.5 kg    Examination:  Awake alert.  In no distress Lungs reveal coarse breath sounds bilaterally. Tracheostomy is noted - clean dry intact S1-S2 is normal regular. Abdomen is obese.  Nontender. No significant pedal edema noted.   Data Reviewed:   CBC No results for input(s): "WBC", "HGB", "HCT", "PLT", "MCV", "MCH", "MCHC", "RDW", "LYMPHSABS", "MONOABS", "EOSABS", "BASOSABS", "BANDABS" in the last 168 hours.  Invalid input(s): "NEUTRABS", "BANDSABD"   Recent Labs  Lab 11/08/23 0813 11/09/23 0634 11/10/23 0601 11/11/23 0552 11/14/23 0809  NA 137 138 140 138 141  K 3.2* 3.1* 3.1* 2.9* 3.4*  CL 94* 96* 97* 98 100  CO2 34* 34* 35* 33* 32  GLUCOSE 86 91 89 87 85  BUN 12 12 10 13 11   CREATININE 0.84 0.93 0.82 0.84 0.85  CALCIUM  9.1 8.9 8.7* 8.6* 8.9  MG 2.1  --  2.1  --   --     CBG: No results for input(s): "GLUCAP" in the last 168 hours.   Radiology Studies: No results found.  Time spent:  [name] DO  [facility]

## 2023-11-15 NOTE — Progress Notes (Signed)
 Pt declining to use humidity for trach despite education.

## 2023-11-16 NOTE — Progress Notes (Signed)
 PROGRESS NOTE  Angel Brewer Angel Brewer:[id number] DOB: [birth year] DOA: 10/22/2023 PCP: [name], NP   LOS: 24 days   Brief Narrative / Interim history: 58 year old female with chronic hypercarbic respiratory failure, trach dependent since May 2023, chronic diastolic CHF, OSA on CPAP at bedtime, PE and DVT on Eliquis , comes into the hospital with worsening respiratory status after mechanical fall with multiple fractures (right clavicle, left tibial plateau, L1 vertebral fractures).    Subjective: No acute issues or events overnight denies nausea vomit diarrhea constipation any fevers chills or chest pain.  She is awaiting to hear about her home ventilator but otherwise eager to discharge home whenever possible and safe.  Assesement and Plan:  Acute on chronic hypoxic and hypercarbic respiratory failure, chronic tracheal stenosis status, chronic OSA/obesity hypoventilation syndrome  -Patient continues to tolerate ventilator quite well. - Previously attempted to wean patient off overnight ventilator but has remained markedly hypercarbic - thus continues to meet criteria for ongoing ventilator needs - Appreciate pulmonology follow-up/recommendations  Acute mechanical fall, multiple fractures Ambulatory dysfunction, improving Including right clavicle, left tibial plateau, L1 vertebral fractures.   Trauma, neurosurgery, orthopedic surgery evaluated patient, for now continue conservative management.   Seems to have improved.  No longer requiring sling.  Has worked well with physical therapy over the past week.  Hypokalemia Ongoing, potassium ordered twice daily x 3 days - unfortunately she continues to refuse - recommend increased fruit intake over the next few days. Repeat labs pending. -Spironolactone  ongoing - cannot increase dose due to borderline BP - Continue to hold Lasix   History of recurrent PE/DVT-continue Eliquis   Chronic diastolic CHF-holding furosemide  in the setting  of hypokalemia, continue spironolactone  continue to increase dose as appropriate.  Essential hypertension-continue medications as below, blood pressure acceptable  Hyperlipidemia-continue statin  Obesity, morbid-BMI 62.5  Scheduled Meds:  amLODipine   5 mg Oral Daily   apixaban   5 mg Oral BID   atorvastatin   20 mg Oral Daily   Chlorhexidine  Gluconate Cloth  6 each Topical Daily   mouth rinse  15 mL Mouth Rinse 4 times per day   pantoprazole   40 mg Oral QHS   sodium chloride  flush  3 mL Intravenous Q12H   spironolactone   25 mg Oral Daily   Continuous Infusions:   PRN Meds:.acetaminophen  (TYLENOL ) oral liquid 160 mg/5 mL, diclofenac  Sodium, docusate sodium , hydrALAZINE , ipratropium-albuterol , naLOXone  (NARCAN )  injection, ondansetron  **OR** ondansetron  (ZOFRAN ) IV, mouth rinse, polyethylene glycol, sodium chloride  flush  Current Outpatient Medications  Medication Instructions   amLODipine  (NORVASC ) 5 mg, Oral, Daily   apixaban  (ELIQUIS ) 5 mg, Oral, 2 times daily   atorvastatin  (LIPITOR) 20 mg, Oral, Daily   dapagliflozin  propanediol (FARXIGA ) 10 mg, Oral, Daily   furosemide  (LASIX ) 20 mg, Oral, Daily, Take 40 mg or 2 tablets for the next 5 days then resume 20 mg or 1 tablet by mouth daily by mouth.   Misc. Devices MISC Please supply patient with tracheostomy humidifier  Z93.0   PRESCRIPTION MEDICATION 1 each, Daily at bedtime   spironolactone  (ALDACTONE ) 25 mg, Oral, Daily    Diet Orders (From admission, onward)     Start     Ordered   10/29/23 1409  Diet regular Room service appropriate? Yes with Assist; Fluid consistency: Thin  Diet effective now       Question Answer Comment  Room service appropriate? Yes with Assist   Fluid consistency: Thin      10/29/23 1409  DVT prophylaxis: On Eliquis    Code Status: Full Code Family Communication: No family at bedside  Consultants:  PCCM Orthopedic surgery Trauma Neurosurgery  Objective: Vitals:   11/16/23  0325 11/16/23 0400 11/16/23 0500 11/16/23 0616  BP:      Pulse: (!) 47 (!) 54  61  Resp: 15 14  18   Temp:  97.6 F (36.4 C)    TempSrc:  Oral    SpO2: 97% 99%  99%  Weight:   (!) 170.4 kg   Height:        Intake/Output Summary (Last 24 hours) at 11/16/2023 0719 Last data filed at 11/15/2023 2230 Gross per 24 hour  Intake 240 ml  Output --  Net 240 ml    Wt Readings from Last 3 Encounters:  11/16/23 (!) 170.4 kg  09/11/23 (!) 178.3 kg  06/17/23 (!) 185.5 kg    Examination:  Awake alert.  In no distress Lungs reveal coarse breath sounds bilaterally. Tracheostomy is noted - clean dry intact S1-S2 is normal regular. Abdomen is obese.  Nontender. No significant pedal edema noted.   Data Reviewed:   CBC No results for input(s): "WBC", "HGB", "HCT", "PLT", "MCV", "MCH", "MCHC", "RDW", "LYMPHSABS", "MONOABS", "EOSABS", "BASOSABS", "BANDABS" in the last 168 hours.  Invalid input(s): "NEUTRABS", "BANDSABD"   Recent Labs  Lab 11/10/23 0601 11/11/23 0552 11/14/23 0809  NA 140 138 141  K 3.1* 2.9* 3.4*  CL 97* 98 100  CO2 35* 33* 32  GLUCOSE 89 87 85  BUN 10 13 11   CREATININE 0.82 0.84 0.85  CALCIUM  8.7* 8.6* 8.9  MG 2.1  --   --     CBG: No results for input(s): "GLUCAP" in the last 168 hours.   Radiology Studies: No results found.  Time spent:  [name] DO  [facility]

## 2023-11-16 NOTE — Progress Notes (Signed)
 Pt placed on full vent support for the night. Pt tolerating well

## 2023-11-16 NOTE — Progress Notes (Signed)
 Physical Therapy Treatment Patient Details Name: Angel Brewer MRN: [id number] DOB: [birth year] Today's Date: 11/16/2023   History of Present Illness Pt is a 58 y.o. female presenting from home after a fall 4/10. Hypoxic and placed on NRB. Found to have acute superior endplate deformity of L1, R clavicular head fx, L displaced tibial plateau intercondylar fx. CTH with no acute findings, fx of L proximal humerus.  PMH significant for chronic hypoxic hypercarbic respiratory failure tracheostomy dependent since May 2023, HFpEF, OSA, PE and DVT on Eliquis , and morbid obesity (BMI 67)    PT Comments  Pt received up in chair and pleasant and motivated to participate in therapy session. Reports L knee pain in standing, but improved towards end of walk. Pt ambulating 500 ft with no assistive device, with slowed gait speed but no gross imbalance. Worked on lateral stepping to R/L for hip strengthening and balance. Desat to 80% on RA, so placed on 6L O2 on 21% FiO2 via trach collar with rebound to 93%. Will continue to follow acutely.     If plan is discharge home, recommend the following: Assistance with cooking/housework;Assist for transportation;Help with stairs or ramp for entrance;A Schult help with walking and/or transfers;A Cercone help with bathing/dressing/bathroom   Can travel by private vehicle        Equipment Recommendations  None recommended by PT    Recommendations for Other Services       Precautions / Restrictions Precautions Precautions: Fall Recall of Precautions/Restrictions: Intact Precaution/Restrictions Comments: watch SpO2, on trach collar Restrictions Weight Bearing Restrictions Per Provider Order: No     Mobility  Bed Mobility               General bed mobility comments: OOB in chair    Transfers Overall transfer level: Modified independent Equipment used: None               General transfer comment: Use of momentum and L arm to push off     Ambulation/Gait Ambulation/Gait assistance: Supervision Gait Distance (Feet): 500 Feet Assistive device: None Gait Pattern/deviations: Decreased stride length, Step-through pattern, Wide base of support           Stairs             Wheelchair Mobility     Tilt Bed    Modified Rankin (Stroke Patients Only)       Balance Overall balance assessment: Needs assistance Sitting-balance support: No upper extremity supported, Feet supported Sitting balance-[name]: Good     Standing balance support: During functional activity, No upper extremity supported Standing balance-[name]: Good                              Communication Communication Communication: No apparent difficulties  Cognition Arousal: Alert Behavior During Therapy: WFL for tasks assessed/performed   PT - Cognitive impairments: No apparent impairments                         Following commands: Intact      Cueing Cueing Techniques: Verbal cues  Exercises Other Exercises Other Exercises: Lateral stepping to R/L x 20 ft in each direction    General Comments        Pertinent Vitals/Pain Pain Assessment Pain Assessment: Faces Faces Pain Scale: Hurts a Patry bit Pain Location: L knee, R shoulder Pain Descriptors / Indicators: Discomfort, Grimacing Pain Intervention(s): Monitored during session  Home Living                          Prior Function            PT Goals (current goals can now be found in the care plan section) Acute Rehab PT Goals Patient Stated Goal: return to independence Potential to Achieve Goals: Good Progress towards PT goals: Progressing toward goals    Frequency    Min 2X/week      PT Plan      Co-evaluation              AM-PAC PT "6 Clicks" Mobility   Outcome Measure  Help needed turning from your back to your side while in a flat bed without using bedrails?: None Help needed moving from lying on  your back to sitting on the side of a flat bed without using bedrails?: None Help needed moving to and from a bed to a chair (including a wheelchair)?: None Help needed standing up from a chair using your arms (e.g., wheelchair or bedside chair)?: None Help needed to walk in hospital room?: A Meissner Help needed climbing 3-5 steps with a railing? : A Ceja 6 Click Score: 22    End of Session Equipment Utilized During Treatment: Oxygen  Activity Tolerance: Patient tolerated treatment well Patient left: in chair;with call bell/phone within reach Nurse Communication: Mobility status PT Visit Diagnosis: Unsteadiness on feet (R26.81);Other abnormalities of gait and mobility (R26.89);Repeated falls (R29.6);Muscle weakness (generalized) (M62.81);Pain Pain - Right/Left: Right Pain - part of body: Shoulder     Time: 6195-0932 PT Time Calculation (min) (ACUTE ONLY): 29 min  Charges:    $Therapeutic Activity: 23-37 mins PT General Charges $$ ACUTE PT VISIT: 1 Visit                     [name], PT, DPT Acute Rehabilitation Services Office [phone]    [name] 11/16/2023, 3:23 PM

## 2023-11-17 LAB — BASIC METABOLIC PANEL WITH GFR
Anion gap: 9 (ref 5–15)
BUN: 12 mg/dL (ref 6–20)
CO2: 31 mmol/L (ref 22–32)
Calcium: 9 mg/dL (ref 8.9–10.3)
Chloride: 100 mmol/L (ref 98–111)
Creatinine, Ser: 0.76 mg/dL (ref 0.44–1.00)
GFR, Estimated: >60 mL/min (ref >60)
Glucose, Bld: 84 mg/dL (ref 70–99)
Potassium: 3.6 mmol/L (ref 3.5–5.1)
Sodium: 140 mmol/L (ref 135–145)

## 2023-11-17 MED ORDER — FUROSEMIDE 20 MG PO TABS
20.0000 mg | ORAL_TABLET | Freq: Every day | ORAL | Status: DC
  Administered 2023-11-17 – 2023-11-26 (×10): 20 mg via ORAL
  Filled 2023-11-17 (×10): qty 1

## 2023-11-17 MED ORDER — SPIRONOLACTONE 25 MG PO TABS
50.0000 mg | ORAL_TABLET | Freq: Every day | ORAL | Status: DC
Start: 2023-11-18 — End: 2023-11-26
  Administered 2023-11-18 – 2023-11-26 (×9): 50 mg via ORAL
  Filled 2023-11-17 (×9): qty 2

## 2023-11-17 NOTE — TOC Progression Note (Addendum)
 Transition of Care [facility]) - Progression Note    Patient Details  Name: Angel Brewer MRN: [id number] Date of Birth: April 08, 1966  Transition of Care [facility]) CM/SW Contact  Ameliah Baskins M, RN Phone Number: 11/17/2023, 11:40am   Clinical Narrative:    Met with patient to discuss insurance issues; patient states she has spoken with someone at [facility].  She states that a Medicaid representative has told her that she should be eligible for MAD, which is Medicaid with disability.  Patient states she has looked into getting a Marketplace plan, but the plans cover nothing, not even her medications.  The person she is communicating with at [facility] is sending her an email with instructions on what she needs to do. I have looked into the possibility of patient renting a Trilogy vent; total cost of the Trilogy through [employer] is $20-$20,000, with monthly rental cost of $800-$900 a month.  Obviously, this is not an option for patient.  Hospital would be willing to pay for 30-day rental with LOG, but patient states she is unable to pay this amount of money on her fixed income after the first month is up.   Addendum: 52 Spoke with Hurtis Maier with [facility] financial counseling: She states she will see patient first thing in the morning to discuss her issue and see if she can provide any assistance with MAD application.  Financial counselor to follow-up with case manager after counseling patient.   Expected Discharge Plan: Home w Home Health Services Barriers to Discharge: Inadequate or no insurance  Expected Discharge Plan and Services   Discharge Planning Services: CM Consult   Living arrangements for the past 2 months: Single Family Home                                       Social Determinants of Health (SDOH) Interventions SDOH Screenings   Food Insecurity: No Food Insecurity (10/26/2023)  Housing: Low Risk  (10/26/2023)  Transportation Needs: No Transportation  Needs (10/26/2023)  Utilities: Not At Risk (10/26/2023)  Depression (PHQ2-9): Low Risk  (06/17/2023)  Financial Resource Strain: Medium Risk (06/16/2023)  Physical Activity: Sufficiently Active (06/16/2023)  Social Connections: Socially Isolated (06/16/2023)  Stress: No Stress Concern Present (06/16/2023)  Tobacco Use: Medium Risk (10/23/2023)  Health Literacy: Adequate Health Literacy (06/17/2023)    Readmission Risk Interventions    09/03/2023    1:17 PM 12/20/2021   11:33 AM  Readmission Risk Prevention Plan  Post Dischage Appt Complete   Medication Screening Complete   Transportation Screening Complete Complete  PCP or Specialist Appt within 3-5 Days  Complete  HRI or Home Care Consult  Complete  Social Work Consult for Recovery Care Planning/Counseling  Complete  Palliative Care Screening  Not Applicable  Medication Review (RN Care Manager)  Complete   Calla Catchings, RN, BSN  Trauma/Neuro ICU Case Manager [phone]

## 2023-11-17 NOTE — Progress Notes (Signed)
 Occupational Therapy Treatment Patient Details Name: Angel Brewer MRN: [id number] DOB: Feb 26, 1966 Today's Date: 11/17/2023   History of present illness Pt is a 58 y.o. female presenting from home after a fall 4/10. Hypoxic and placed on NRB. Found to have acute superior endplate deformity of L1, R clavicular head fx, L displaced tibial plateau intercondylar fx. CTH with no acute findings, fx of L proximal humerus.  PMH significant for chronic hypoxic hypercarbic respiratory failure tracheostomy dependent since May 2023, HFpEF, OSA, PE and DVT on Eliquis , and morbid obesity (BMI 67)   OT comments  Pt currently at min guard assist for sit to stand from the lower recliner.  Once standing able to complete functional mobility greater than 200' and simulated transfers with supervision and no assistive device.  Still with increased right shoulder pain with AAROM but HR ranging from 88 BPM up to 130 BPM.  Oxygen  sats at 88-99% on room air with trach collar.  Pt progressing well and will continue to benefit from acute care OT at this time.  Recommend HHOT at discharge to continue progression.        If plan is discharge home, recommend the following:  A Converse help with walking and/or transfers;A Jollie help with bathing/dressing/bathroom;Assistance with cooking/housework;Help with stairs or ramp for entrance;Assist for transportation   Equipment Recommendations  None recommended by OT (pt reports family is getting her a toilet riser for her elevated toilet and she already has a larger 3:1)       Precautions / Restrictions Precautions Precautions: Fall Recall of Precautions/Restrictions: Intact Precaution/Restrictions Comments: watch SpO2, on trach collar Restrictions Weight Bearing Restrictions Per Provider Order: Yes RUE Weight Bearing Per Provider Order: Non weight bearing LUE Weight Bearing Per Provider Order: Weight bearing as tolerated LLE Weight Bearing Per Provider Order: Weight  bearing as tolerated Other Position/Activity Restrictions: Per ortho on 4/11: NWB but if she can tolerate using the RUE for transfers she is allowed       Mobility Bed Mobility                    Transfers Overall transfer level: Needs assistance Equipment used: None Transfers: Sit to/from Stand Sit to Stand: Contact guard assist     Step pivot transfers: Supervision     General transfer comment: Contact guard and 2 attempts needed for completion of sit to stand from the recliner.  BUE use to achieve sit to stand.     Balance Overall balance assessment: Needs assistance Sitting-balance support: No upper extremity supported, Feet supported Sitting balance-[name]: Good     Standing balance support: During functional activity, No upper extremity supported Standing balance-[name]: Good Standing balance comment: Able to ambulate without UE support without LOB                           ADL either performed or assessed with clinical judgement   ADL Overall ADL's : Needs assistance/impaired                         Toilet Transfer: Contact guard Marine scientist Details (indicate cue type and reason): simulated         Functional mobility during ADLs: Supervision/safety (ambulation without assistive device) General ADL Comments: Pt with HR in the upper 80s to low 90s at rest.  Increased up to 130 BPM with ambulation and standing right shoulder exercises.  Therapist completed  2 sets of 10 reps AAROM shoulder flexion on the RUE.  Some pain with flexion above 120 degrees, but more pain with eccentric holding when lowering it.  Had her complete 1 set of 10 reps wall slide in standing for shoulder flexion and shoulder abduction.  Good AROM noted up to approximately 130-140 degrees for flexion with 120 for abduction using washcloth on the wall.  Oxygen  sats on room air trach collar from 88-99%.  Completed functional mobility one lap  around the unit at slow pace without assistive device.  Increased knee pain with sit to stand requiring min guard assist and two attempts to complete standing.      Cognition Arousal: Alert Behavior During Therapy: WFL for tasks assessed/performed Cognition: No apparent impairments                               Following commands: Intact                      Pertinent Vitals/ Pain       Pain Assessment Pain Assessment: Faces Faces Pain Scale: Hurts Shells more Pain Location: right shoulder with AROM Pain Descriptors / Indicators: Discomfort, Grimacing Pain Intervention(s): Limited activity within patient's tolerance, Repositioned         Frequency  Min 3X/week        Progress Toward Goals  OT Goals(current goals can now be found in the care plan section)  Progress towards OT goals: Progressing toward goals  Acute Rehab OT Goals Patient Stated Goal: She wants to get back to her old self. OT Goal Formulation: With patient Time For Goal Achievement: 11/24/23 Potential to Achieve Goals: Good  Plan         AM-PAC OT "6 Clicks" Daily Activity     Outcome Measure   Help from another person eating meals?: None Help from another person taking care of personal grooming?: A Leikam Help from another person toileting, which includes using toliet, bedpan, or urinal?: A Lot Help from another person bathing (including washing, rinsing, drying)?: A Lot Help from another person to put on and taking off regular upper body clothing?: A Gau Help from another person to put on and taking off regular lower body clothing?: A Lot 6 Click Score: 16    End of Session    OT Visit Diagnosis: Unsteadiness on feet (R26.81);Muscle weakness (generalized) (M62.81);History of falling (Z91.81);Pain Pain - Right/Left: Right Pain - part of body: Shoulder   Activity Tolerance Patient tolerated treatment well   Patient Left in chair;with call bell/phone within reach    Nurse Communication Mobility status        Time: 1308-6578 OT Time Calculation (min): 40 min  Charges: OT General Charges $OT Visit: 1 Visit OT Treatments $Therapeutic Activity: 23-37 mins $Therapeutic Exercise: 8-22 mins  Ardena Becker, OTR/L Acute Rehabilitation Services  Office [phone] 11/17/2023

## 2023-11-17 NOTE — Progress Notes (Signed)
 PROGRESS NOTE  Angel Brewer VWU:[id number] DOB: 04/15/1966 DOA: 10/22/2023 PCP: [name], NP   LOS: 25 days   Brief Narrative / Interim history: 58 year old female with chronic hypercarbic respiratory failure, trach dependent since May 2023, chronic diastolic CHF, OSA on CPAP at bedtime, PE and DVT on Eliquis , comes into the hospital with worsening respiratory status after mechanical fall with multiple fractures (right clavicle, left tibial plateau, L1 vertebral fractures).    Subjective: No acute issues or events overnight denies nausea vomit diarrhea constipation any fevers chills or chest pain.  She is awaiting to hear about her home ventilator but otherwise eager to discharge home whenever possible and safe.  Assesement and Plan:  Acute on chronic hypoxic and hypercarbic respiratory failure, chronic tracheal stenosis status, chronic OSA/obesity hypoventilation syndrome  -Patient continues to tolerate ventilator quite well. - Previously attempted to wean patient off overnight ventilator but has remained markedly hypercarbic - thus continues to meet criteria for ongoing ventilator needs - Appreciate pulmonology follow-up/recommendations  Acute mechanical fall, multiple fractures Ambulatory dysfunction, improving Including right clavicle, left tibial plateau, L1 vertebral fractures.   Trauma, neurosurgery, orthopedic surgery evaluated patient, for now continue conservative management.   Seems to have improved.  No longer requiring sling.  Has worked well with physical therapy over the past week -he is essentially independent at this point  Hypokalemia - Patient continues to refuse potassium supplementation - Potassium corrected with dietary changes and spironolactone , restart home furosemide  and increase spironolactone  to 50 mg daily to offset potassium losses, follow over the next 72 hours, if patient cannot tolerate furosemide  in the setting of hypokalemia it appears  she is unwilling to initiate potassium supplementation and thus will have to remain off diuretics despite known history of heart failure as below  History of recurrent PE/DVT-continue Eliquis   Chronic diastolic CHF-resume furosemide , increase spironolactone  50 mg daily patient continues to refuse potassium supplementation as above  Essential hypertension-continue medications as below, blood pressure acceptable Hyperlipidemia-continue statin Obesity, morbid-BMI 62.5  Scheduled Meds:  amLODipine   5 mg Oral Daily   apixaban   5 mg Oral BID   atorvastatin   20 mg Oral Daily   Chlorhexidine  Gluconate Cloth  6 each Topical Daily   mouth rinse  15 mL Mouth Rinse 4 times per day   pantoprazole   40 mg Oral QHS   sodium chloride  flush  3 mL Intravenous Q12H   spironolactone   25 mg Oral Daily   Continuous Infusions:   PRN Meds:.acetaminophen  (TYLENOL ) oral liquid 160 mg/5 mL, diclofenac  Sodium, docusate sodium , hydrALAZINE , ipratropium-albuterol , naLOXone  (NARCAN )  injection, ondansetron  **OR** ondansetron  (ZOFRAN ) IV, mouth rinse, polyethylene glycol, sodium chloride  flush  Current Outpatient Medications  Medication Instructions   amLODipine  (NORVASC ) 5 mg, Oral, Daily   apixaban  (ELIQUIS ) 5 mg, Oral, 2 times daily   atorvastatin  (LIPITOR) 20 mg, Oral, Daily   dapagliflozin  propanediol (FARXIGA ) 10 mg, Oral, Daily   furosemide  (LASIX ) 20 mg, Oral, Daily, Take 40 mg or 2 tablets for the next 5 days then resume 20 mg or 1 tablet by mouth daily by mouth.   Misc. Devices MISC Please supply patient with tracheostomy humidifier  Z93.0   PRESCRIPTION MEDICATION 1 each, Daily at bedtime   spironolactone  (ALDACTONE ) 25 mg, Oral, Daily    Diet Orders (From admission, onward)     Start     Ordered   10/29/23 1409  Diet regular Room service appropriate? Yes with Assist; Fluid consistency: Thin  Diet effective now  Question Answer Comment  Room service appropriate? Yes with Assist   Fluid  consistency: Thin      10/29/23 1409            DVT prophylaxis: On Eliquis    Code Status: Full Code Family Communication: No family at bedside  Consultants:  PCCM Orthopedic surgery Trauma Neurosurgery  Objective: Vitals:   11/17/23 0500 11/17/23 0549 11/17/23 0600 11/17/23 0705  BP:      Pulse: (!) 47 (!) 50 (!) 57   Resp: 15 16 18    Temp:      TempSrc:      SpO2: 100% 94% 94%   Weight:    (!) 170.4 kg  Height:        Intake/Output Summary (Last 24 hours) at 11/17/2023 0711 Last data filed at 11/16/2023 1400 Gross per 24 hour  Intake 480 ml  Output --  Net 480 ml    Wt Readings from Last 3 Encounters:  11/17/23 (!) 170.4 kg  09/11/23 (!) 178.3 kg  06/17/23 (!) 185.5 kg    Examination:  Awake alert.  In no distress Lungs reveal coarse breath sounds bilaterally. Tracheostomy is noted - clean dry intact S1-S2 is normal regular. Abdomen is obese.  Nontender. No significant pedal edema noted.   Data Reviewed:   CBC No results for input(s): "WBC", "HGB", "HCT", "PLT", "MCV", "MCH", "MCHC", "RDW", "LYMPHSABS", "MONOABS", "EOSABS", "BASOSABS", "BANDABS" in the last 168 hours.  Invalid input(s): "NEUTRABS", "BANDSABD"   Recent Labs  Lab 11/11/23 0552 11/14/23 0809  NA 138 141  K 2.9* 3.4*  CL 98 100  CO2 33* 32  GLUCOSE 87 85  BUN 13 11  CREATININE 0.84 0.85  CALCIUM  8.6* 8.9    CBG: No results for input(s): "GLUCAP" in the last 168 hours.   Radiology Studies: No results found.  Time spent:  [name] DO  [facility]

## 2023-11-17 NOTE — Progress Notes (Signed)
 Pt taken off the vent, pt doesn't want to wear ATC, placed on RA with PMV in place.

## 2023-11-18 LAB — BASIC METABOLIC PANEL WITH GFR
Anion gap: 8 (ref 5–15)
BUN: 12 mg/dL (ref 6–20)
CO2: 31 mmol/L (ref 22–32)
Calcium: 9.1 mg/dL (ref 8.9–10.3)
Chloride: 101 mmol/L (ref 98–111)
Creatinine, Ser: 0.89 mg/dL (ref 0.44–1.00)
GFR, Estimated: >60 mL/min (ref >60)
Glucose, Bld: 85 mg/dL (ref 70–99)
Potassium: 3.7 mmol/L (ref 3.5–5.1)
Sodium: 140 mmol/L (ref 135–145)

## 2023-11-18 MED ORDER — POTASSIUM CHLORIDE CRYS ER 20 MEQ PO TBCR: 40.0000 meq | EXTENDED_RELEASE_TABLET | Freq: Once | ORAL | Status: DC

## 2023-11-18 NOTE — Progress Notes (Signed)
 [facility] ADULT ICU REPLACEMENT PROTOCOL   The patient does apply for the [facility] Adult ICU Electrolyte Replacment Protocol based on the criteria listed below:   1.Exclusion criteria: TCTS, ECMO, Dialysis, and Myasthenia Gravis patients 2. Is GFR >/= 30 ml/min? Yes.    Patient's GFR today is >60 3. Is SCr </= 2? Yes.   Patient's SCr is 0.89 mg/dL 4. Did SCr increase >/= 0.5 in 24 hours? No. 5.Pt's weight >40kg  Yes.   6. Abnormal electrolyte(s): potassium 3.7  7. Electrolytes replaced per protocol 8.  Call MD STAT for K+ </= 2.5, Phos </= 1, or Mag </= 1 Physician:  protocol  Marva Sleight 11/18/2023 6:28 AM

## 2023-11-18 NOTE — Progress Notes (Signed)
 [facility] ROUNDING NOTE Angel Brewer GNF:[id number]  DOB: [birth year]  DOA: 10/22/2023  PCP: [name], NP  11/18/2023,1:07 PM  LOS: 26 days    Code Status: Full code   From:  home   Current Dispo: unclear   58 year old black female Chronic respiratory failure with tracheostomy on 11/29/2021--uses nocturnal oxygen  OHSS BMI 60/3 obesity Previous pulmonary embolism and DVT - 2017 and then recurrence 12/2020 transition to lifelong Chronic diastolic HF last EF 55-60% on 8/46/9629 with moderately elevated PASP  Went to [facility] 10/22/2023 with fall and inability to ambulate CXR showed deformity of L1 nondisplaced fracture right clavicular head nondisplaced fracture proximal left humerus x-ray of left femur showed displaced tibial plateau intercondylar fracture and tricompartmental degenerative changes left knee Initial ABG 7.21 pCO2 93 bicarb 37 placed on ventilator and changed to Shiley cuffed tracheostomy Trauma surgery consulted orthopedics consulted neurosurgery also consulted  transferred to hospitalist service 10/28/23  Plan   acute superimposed on chronic hypoxic/hypercarbic respiratory failure// OHSS unable to really wean off overnight ventilator remains hypercarbic-requires ongoing ventilator hs Awaiting discussion with case management and financial planning to figure out if we can get her home ventilator at night  Mechanical fall with acute fractures-left tibial plateau L1 vertebral as well as right clavicular fractures Has progressed with conservative management and does not require sling Will require home health OT PT given shoulder pain etc.  HFpEF chronic Continues on Lasix  20 daily Aldactone  50 daily amlodipine  5 Blood pressure is relatively well-controlled  Recurrent DVT/PE Continue Eliquis  5 twice daily lifelong  Hypokalemia Reluctant to use potassium replacement-meds adjusted as above to those doses--periodic labs and mag   DVT prophylaxis:  eliquis   Status is: Inpatient Remains inpatient appropriate because:   Awaiting Trilogy vent --not safe to d/c without this    Subjective: Awake coherent intelligible on PMV No pain no fever no chills no rigors  Objective + exam Vitals:   11/18/23 0800 11/18/23 0823 11/18/23 1155 11/18/23 1200  BP: 136/75 136/75 (!) 107/94   Pulse: 63  (!) 59   Resp: (!) 24  18   Temp: 97.8 F (36.6 C)   98.9 F (37.2 C)  TempSrc: Oral   Oral  SpO2: 90%  93%   Weight:      Height:       Filed Weights   11/15/23 0500 11/16/23 0500 11/17/23 0705  Weight: (!) 170.5 kg (!) 170.4 kg (!) 170.4 kg    Examination: Coherent awake alert in nad Cta b no added sound no wheeze rales rhonchi Abd soft nt nd no rebound no guard--morbidly obese, no hsm,  Trace LE edema   Data Reviewed: reviewed   CBC    Component Value Date/Time   WBC 4.3 11/05/2023 0638   RBC 4.54 11/05/2023 0638   HGB 12.9 11/08/2023 0351   HGB 13.3 06/17/2023 1112   HCT 38.0 11/08/2023 0351   HCT 42.3 06/17/2023 1112   PLT 204 11/05/2023 0638   PLT 239 06/17/2023 1112   MCV 94.7 11/05/2023 0638   MCV 95 06/17/2023 1112   MCH 29.3 11/05/2023 0638   MCHC 30.9 11/05/2023 0638   RDW 14.8 11/05/2023 0638   RDW 12.5 06/17/2023 1112   LYMPHSABS 0.8 10/27/2023 0512   LYMPHSABS 1.3 06/17/2023 1112   MONOABS 0.5 10/27/2023 0512   EOSABS 0.1 10/27/2023 0512   EOSABS 0.2 06/17/2023 1112   BASOSABS 0.0 10/27/2023 0512   BASOSABS 0.1 06/17/2023 1112      Latest  Ref Rng & Units 11/18/2023    5:19 AM 11/17/2023    5:54 AM 11/14/2023    8:09 AM  CMP  Glucose 70 - 99 mg/dL 85  84  85   BUN 6 - 20 mg/dL 12  12  11    Creatinine 0.44 - 1.00 mg/dL 1.30  8.65  7.84   Sodium 135 - 145 mmol/L 140  140  141   Potassium 3.5 - 5.1 mmol/L 3.7  3.6  3.4   Chloride 98 - 111 mmol/L 101  100  100   CO2 22 - 32 mmol/L 31  31  32   Calcium  8.9 - 10.3 mg/dL 9.1  9.0  8.9     Scheduled Meds:  amLODipine   5 mg Oral Daily   apixaban   5 mg  Oral BID   atorvastatin   20 mg Oral Daily   Chlorhexidine  Gluconate Cloth  6 each Topical Daily   furosemide   20 mg Oral Daily   mouth rinse  15 mL Mouth Rinse 4 times per day   pantoprazole   40 mg Oral QHS   potassium chloride   40 mEq Oral Once   sodium chloride  flush  3 mL Intravenous Q12H   spironolactone   50 mg Oral Daily   Continuous Infusions:  Time  44  Jai-Gurmukh Taletha Twiford, MD  [facility]

## 2023-11-18 NOTE — Plan of Care (Signed)
  Problem: Metabolic: Goal: Ability to maintain appropriate glucose levels will improve Outcome: Progressing   Problem: Nutritional: Goal: Maintenance of adequate nutrition will improve Outcome: Progressing   Problem: Skin Integrity: Goal: Risk for impaired skin integrity will decrease Outcome: Progressing   Problem: Tissue Perfusion: Goal: Adequacy of tissue perfusion will improve Outcome: Progressing   Problem: Clinical Measurements: Goal: Will remain free from infection Outcome: Progressing Goal: Respiratory complications will improve Outcome: Progressing Goal: Cardiovascular complication will be avoided Outcome: Progressing   Problem: Activity: Goal: Risk for activity intolerance will decrease Outcome: Progressing   Problem: Nutrition: Goal: Adequate nutrition will be maintained Outcome: Progressing   Problem: Coping: Goal: Level of anxiety will decrease Outcome: Progressing   Problem: Elimination: Goal: Will not experience complications related to bowel motility Outcome: Progressing Goal: Will not experience complications related to urinary retention Outcome: Progressing   Problem: Pain Managment: Goal: General experience of comfort will improve and/or be controlled Outcome: Progressing

## 2023-11-18 NOTE — Progress Notes (Addendum)
 Physical Therapy Treatment Patient Details Name: Angel Brewer MRN: [id number] DOB: 16-Jun-1966 Today's Date: 11/18/2023   History of Present Illness Pt is a 58 y.o. female presenting from home after a fall 4/10. Hypoxic and placed on NRB. Found to have acute superior endplate deformity of L1, R clavicular head fx, L displaced tibial plateau intercondylar fx. CTH with no acute findings, fx of L proximal humerus.  PMH significant for chronic hypoxic hypercarbic respiratory failure tracheostomy dependent since May 2023, HFpEF, OSA, PE and DVT on Eliquis , and morbid obesity (BMI 67)    PT Comments  Pt received up in chair and agreeable to session. Worked on standing banded exercises for BLE strengthening and ambulated 2 laps around unit with good tolerance. SpO2 91-92% on RA, HR 102. Will continue to follow acutely.     If plan is discharge home, recommend the following: Assistance with cooking/housework;Assist for transportation;Help with stairs or ramp for entrance;A [place] help with walking and/or transfers;A [place] help with bathing/dressing/bathroom   Can travel by private vehicle        Equipment Recommendations  None recommended by PT    Recommendations for Other Services       Precautions / Restrictions Precautions Precautions: None Restrictions Weight Bearing Restrictions Per Provider Order: No     Mobility  Bed Mobility               General bed mobility comments: OOB in chair    Transfers Overall transfer level: Needs assistance Equipment used: None Transfers: Sit to/from Stand Sit to Stand: Supervision           General transfer comment: Pushing off with LUE, use of momentum, successful on 2nd attempt    Ambulation/Gait Ambulation/Gait assistance: Supervision Gait Distance (Feet): 500 Feet Assistive device: None Gait Pattern/deviations: Decreased stride length, Step-through pattern, Wide base of support       General Gait Details: Pt  reports increased gait speed in comparison to PTA   Stairs             Wheelchair Mobility     Tilt Bed    Modified Rankin (Stroke Patients Only)       Balance Overall balance assessment: Needs assistance Sitting-balance support: No upper extremity supported, Feet supported Sitting balance-[name]: Good     Standing balance support: During functional activity, No upper extremity supported Standing balance-[name]: Good                              Communication Communication Communication: No apparent difficulties  Cognition Arousal: Alert Behavior During Therapy: WFL for tasks assessed/performed   PT - Cognitive impairments: No apparent impairments                         Following commands: Intact      Cueing Cueing Techniques: Verbal cues  Exercises General Exercises - Lower Extremity Hip ABduction/ADduction: Strengthening, 10 reps, Standing, Other (comment) (x 2 sets, yellow theraband) Other Exercises Other Exercises: Standing: bilateral hip extension x 10 reps x 2 sets (yellow theraband), bilateral hamstring curls x 10 reps x 2 sets (yellow theraband)    General Comments        Pertinent Vitals/Pain Pain Assessment Pain Assessment: Faces Faces Pain Scale: Hurts a [place] bit Pain Location: right shoulder with AROM Pain Descriptors / Indicators: Discomfort, Grimacing Pain Intervention(s): Monitored during session    Home Living  Prior Function            PT Goals (current goals can now be found in the care plan section) Acute Rehab PT Goals Patient Stated Goal: return to independence Potential to Achieve Goals: Good Progress towards PT goals: Progressing toward goals    Frequency    Min 2X/week      PT Plan      Co-evaluation              AM-PAC PT "6 Clicks" Mobility   Outcome Measure  Help needed turning from your back to your side while in a flat bed  without using bedrails?: None Help needed moving from lying on your back to sitting on the side of a flat bed without using bedrails?: None Help needed moving to and from a bed to a chair (including a wheelchair)?: None Help needed standing up from a chair using your arms (e.g., wheelchair or bedside chair)?: A Creelman Help needed to walk in hospital room?: A Ragle Help needed climbing 3-5 steps with a railing? : A [place] 6 Click Score: 21    End of Session   Activity Tolerance: Patient tolerated treatment well Patient left: in chair;with call bell/phone within reach Nurse Communication: Mobility status;Other (comment) (applied foam on posterior LLE due to skin tear) PT Visit Diagnosis: Unsteadiness on feet (R26.81);Other abnormalities of gait and mobility (R26.89);Repeated falls (R29.6);Muscle weakness (generalized) (M62.81);Pain Pain - Right/Left: Right Pain - part of body: Shoulder     Time: 1610-9604 PT Time Calculation (min) (ACUTE ONLY): 34 min  Charges:    $Therapeutic Activity: 23-37 mins PT General Charges $$ ACUTE PT VISIT: 1 Visit                    [name], PT, DPT Acute Rehabilitation Services Office [phone]    [name] 11/18/2023, 3:19 PM

## 2023-11-19 LAB — BASIC METABOLIC PANEL WITH GFR
Anion gap: 9 (ref 5–15)
BUN: 11 mg/dL (ref 6–20)
CO2: 32 mmol/L (ref 22–32)
Calcium: 9.1 mg/dL (ref 8.9–10.3)
Chloride: 98 mmol/L (ref 98–111)
Creatinine, Ser: 0.84 mg/dL (ref 0.44–1.00)
GFR, Estimated: >60 mL/min (ref >60)
Glucose, Bld: 84 mg/dL (ref 70–99)
Potassium: 3.6 mmol/L (ref 3.5–5.1)
Sodium: 139 mmol/L (ref 135–145)

## 2023-11-19 MED ORDER — SALINE SPRAY 0.65 % NA SOLN
1.0000 | NASAL | Status: DC | PRN
  Filled 2023-11-19: qty 44

## 2023-11-19 NOTE — Plan of Care (Signed)
  Problem: Nutritional: Goal: Maintenance of adequate nutrition will improve Outcome: Progressing Goal: Progress toward achieving an optimal weight will improve Outcome: Progressing   Problem: Skin Integrity: Goal: Risk for impaired skin integrity will decrease Outcome: Progressing   Problem: Tissue Perfusion: Goal: Adequacy of tissue perfusion will improve Outcome: Progressing   Problem: Clinical Measurements: Goal: Diagnostic test results will improve Outcome: Progressing Goal: Respiratory complications will improve Outcome: Progressing   Problem: Activity: Goal: Risk for activity intolerance will decrease Outcome: Progressing   Problem: Nutrition: Goal: Adequate nutrition will be maintained Outcome: Progressing   Problem: Coping: Goal: Level of anxiety will decrease Outcome: Progressing   Problem: Pain Managment: Goal: General experience of comfort will improve and/or be controlled Outcome: Progressing   Problem: Safety: Goal: Ability to remain free from injury will improve Outcome: Progressing   Problem: Skin Integrity: Goal: Risk for impaired skin integrity will decrease Outcome: Progressing

## 2023-11-19 NOTE — Progress Notes (Signed)
 Occupational Therapy Treatment Patient Details Name: Angel Brewer MRN: [id number] DOB: 03-31-1966 Today's Date: 11/19/2023   History of present illness Pt is a 58 y.o. female presenting from home after a fall 4/10. Hypoxic and placed on NRB. Found to have acute superior endplate deformity of L1, R clavicular head fx, L displaced tibial plateau intercondylar fx. CTH with no acute findings, fx of L proximal humerus.  PMH significant for chronic hypoxic hypercarbic respiratory failure tracheostomy dependent since May 2023, HFpEF, OSA, PE and DVT on Eliquis , and morbid obesity (BMI 67)   OT comments  Pt in recliner and eager to participate in OT.  Transfers with supervision, mobility with supervision; toileting (anterior peri care) with supervision today and grooming at sink with supervision.  Mobility on RA with SpO2 pleuth inaccurate but at rest stable with no increased s/s during mobility.  Continued to encouraged functional use of R UE during ADLs, pt reports she is trying to eat/wash face with R hand.Will follow acutely, continue to recommend HH.       If plan is discharge home, recommend the following:  A [place] help with walking and/or transfers;A [place] help with bathing/dressing/bathroom;Assistance with cooking/housework;Help with stairs or ramp for entrance;Assist for transportation   Equipment Recommendations  None recommended by OT    Recommendations for Other Services      Precautions / Restrictions Precautions Precautions: None Recall of Precautions/Restrictions: Intact Precaution/Restrictions Comments: watch SpO2, on trach collar Restrictions Weight Bearing Restrictions Per Provider Order: Yes RUE Weight Bearing Per Provider Order: Non weight bearing LUE Weight Bearing Per Provider Order: Weight bearing as tolerated LLE Weight Bearing Per Provider Order: Weight bearing as tolerated Other Position/Activity Restrictions: Per ortho on 4/11: NWB but if she can tolerate  using the RUE for transfers she is allowed       Mobility Bed Mobility               General bed mobility comments: OOB in chair    Transfers Overall transfer level: Needs assistance Equipment used: None Transfers: Sit to/from Stand Sit to Stand: Supervision           General transfer comment: pushing with LU E     Balance Overall balance assessment: Needs assistance Sitting-balance support: No upper extremity supported, Feet supported Sitting balance-[name]: Good     Standing balance support: During functional activity, No upper extremity supported Standing balance-[name]: Good                             ADL either performed or assessed with clinical judgement   ADL Overall ADL's : Needs assistance/impaired     Grooming: Wash/dry hands;Supervision/safety;Standing                   Toilet Transfer: Supervision/safety;Ambulation Toilet Transfer Details (indicate cue type and reason): to [facility] Toileting- Clothing Manipulation and Hygiene: Supervision/safety;Sit to/from stand Toileting - Clothing Manipulation Details (indicate cue type and reason): anterior hygiene after urination     Functional mobility during ADLs: Supervision/safety General ADL Comments: VSS on RA during session, poor pleuth with activity but no increased RR or S/S noted.    Extremity/Trunk Assessment Upper Extremity Assessment Upper Extremity Assessment: Right hand dominant;RUE deficits/detail RUE Deficits / Details: improving AROM of R shoulder, working on [employer] using wall walks RUE Coordination: decreased gross motor            Vision  Perception     Praxis     Communication Communication Communication: No apparent difficulties Factors Affecting Communication: [name] valve   Cognition Arousal: Alert Behavior During Therapy: WFL for tasks assessed/performed Cognition: No apparent impairments                                Following commands: Intact        Cueing   Cueing Techniques: Verbal cues  Exercises Exercises: Other exercises Other Exercises Other Exercises: standing wall walks x 10 reps 2 sets R UE to 140-150*    Shoulder Instructions       General Comments      Pertinent Vitals/ Pain       Pain Assessment Pain Assessment: Faces Faces Pain Scale: Hurts a [place] bit Pain Location: right shoulder with AROM Pain Descriptors / Indicators: Discomfort, Grimacing Pain Intervention(s): Limited activity within patient's tolerance, Monitored during session, Repositioned  Home Living                                          Prior Functioning/Environment              Frequency  Min 3X/week        Progress Toward Goals  OT Goals(current goals can now be found in the care plan section)  Progress towards OT goals: Progressing toward goals  Acute Rehab OT Goals Patient Stated Goal: get back to normal OT Goal Formulation: With patient Time For Goal Achievement: 11/24/23 Potential to Achieve Goals: Good  Plan      Co-evaluation                 AM-PAC OT "6 Clicks" Daily Activity     Outcome Measure   Help from another person eating meals?: None Help from another person taking care of personal grooming?: A Shreiner Help from another person toileting, which includes using toliet, bedpan, or urinal?: A [place] Help from another person bathing (including washing, rinsing, drying)?: A Lot Help from another person to put on and taking off regular upper body clothing?: A Fedorchak Help from another person to put on and taking off regular lower body clothing?: A Lot 6 Click Score: 17    End of Session    OT Visit Diagnosis: Unsteadiness on feet (R26.81);Muscle weakness (generalized) (M62.81);History of falling (Z91.81);Pain Pain - Right/Left: Right Pain - part of body: Shoulder   Activity Tolerance Patient tolerated treatment well   Patient Left in  chair;with call bell/phone within reach   Nurse Communication Mobility status        Time: 1203-1227 OT Time Calculation (min): 24 min  Charges: OT General Charges $OT Visit: 1 Visit OT Treatments $Self Care/Home Management : 23-37 mins  Bary Boss, OT Acute Rehabilitation Services Office [phone] Secure Chat Preferred    Fredrich Jefferson 11/19/2023, 1:28 PM

## 2023-11-19 NOTE — Progress Notes (Signed)
 [facility] ROUNDING NOTE Angel Brewer [id number]:[id number]  DOB: 11/08/1965  DOA: 10/22/2023  PCP: [name], NP  11/19/2023,9:49 AM  LOS: 27 days    Code Status: Full code   From:  home   Current Dispo: unclear   58 year old black female Chronic respiratory failure with tracheostomy on 11/29/2021--uses nocturnal oxygen  OHSS BMI 60/3 obesity Previous pulmonary embolism and DVT - 2017 and then recurrence 12/2020 transition to lifelong Chronic diastolic HF last EF 55-60% on 9/56/2130 with moderately elevated PASP  Went to [facility] 10/22/2023 with fall and inability to ambulate CXR showed deformity of L1 nondisplaced fracture right clavicular head nondisplaced fracture proximal left humerus x-ray of left femur showed displaced tibial plateau intercondylar fracture and tricompartmental degenerative changes left knee Initial ABG 7.21 pCO2 93 bicarb 37 placed on ventilator and changed to Shiley cuffed tracheostomy Trauma surgery consulted orthopedics consulted neurosurgery also consulted  transferred to hospitalist service 10/28/23  Plan   acute superimposed on chronic hypoxic/hypercarbic respiratory failure// OHSS unable to be off overnight ventilator remains hypercarbic-requires ongoing ventilator hs Case management working with Medicaid to get approval  Mechanical fall with acute fractures-left tibial plateau L1 vertebral as well as right clavicular fractures Has progressed with conservative management and does not require sling Will require home health OT PT given shoulder pain etc.  HFpEF chronic Continues on Lasix  20 daily Aldactone  50 daily amlodipine  5 Blood pressure is relatively well-controlled  Recurrent DVT/PE Continue Eliquis  5 twice daily lifelong  Hypokalemia Reluctant to use potassium replacement-meds adjusted as above to those doses--periodic labs and mag   DVT prophylaxis: eliquis   Status is: Inpatient Remains inpatient appropriate because:   Awaiting  Trilogy vent --not safe to d/c without this    Subjective:  No distress sitting in bed no pain fever chills  Objective + exam Vitals:   11/19/23 0500 11/19/23 0600 11/19/23 0723 11/19/23 0800  BP:    97/65  Pulse: (!) 48 (!) 59 84 (!) 55  Resp: 13 19 18 16   Temp:    97.8 F (36.6 C)  TempSrc:    Oral  SpO2: 97% 98% 94% 96%  Weight:      Height:       Filed Weights   11/15/23 0500 11/16/23 0500 11/17/23 0705  Weight: (!) 170.5 kg (!) 170.4 kg (!) 170.4 kg    Examination: Coherent awake alert in nad S1-S2 no murmur no rub no gallop Chest is clear anteriorly No lower extremity edema Abdominal obese nontender not distended  Data Reviewed: reviewed   CBC    Component Value Date/Time   WBC 4.3 11/05/2023 0638   RBC 4.54 11/05/2023 0638   HGB 12.9 11/08/2023 0351   HGB 13.3 06/17/2023 1112   HCT 38.0 11/08/2023 0351   HCT 42.3 06/17/2023 1112   PLT 204 11/05/2023 0638   PLT 239 06/17/2023 1112   MCV 94.7 11/05/2023 0638   MCV 95 06/17/2023 1112   MCH 29.3 11/05/2023 0638   MCHC 30.9 11/05/2023 0638   RDW 14.8 11/05/2023 0638   RDW 12.5 06/17/2023 1112   LYMPHSABS 0.8 10/27/2023 0512   LYMPHSABS 1.3 06/17/2023 1112   MONOABS 0.5 10/27/2023 0512   EOSABS 0.1 10/27/2023 0512   EOSABS 0.2 06/17/2023 1112   BASOSABS 0.0 10/27/2023 0512   BASOSABS 0.1 06/17/2023 1112      Latest Ref Rng & Units 11/19/2023    6:01 AM 11/18/2023    5:19 AM 11/17/2023    5:54 AM  CMP  Glucose 70 - 99 mg/dL 84  85  84   BUN 6 - 20 mg/dL 11  12  12    Creatinine 0.44 - 1.00 mg/dL 4.09  8.11  9.14   Sodium 135 - 145 mmol/L 139  140  140   Potassium 3.5 - 5.1 mmol/L 3.6  3.7  3.6   Chloride 98 - 111 mmol/L 98  101  100   CO2 22 - 32 mmol/L 32  31  31   Calcium  8.9 - 10.3 mg/dL 9.1  9.1  9.0     Scheduled Meds:  amLODipine   5 mg Oral Daily   apixaban   5 mg Oral BID   atorvastatin   20 mg Oral Daily   Chlorhexidine  Gluconate Cloth  6 each Topical Daily   furosemide   20 mg Oral Daily    mouth rinse  15 mL Mouth Rinse 4 times per day   pantoprazole   40 mg Oral QHS   potassium chloride   40 mEq Oral Once   sodium chloride  flush  3 mL Intravenous Q12H   spironolactone   50 mg Oral Daily   Continuous Infusions:  Time  44  Jai-Gurmukh Sargent Mankey, MD  [facility]

## 2023-11-20 NOTE — Progress Notes (Signed)
 TR[facility] ROUNDING NOTE Angel Brewer [id number]:[id number]  DOB: 11-16-1965  DOA: 10/22/2023  PCP: [name], NP  11/20/2023,2:21 PM  LOS: 28 days    Code Status: Full code   From:  home   Current Dispo: unclear   58 year old black female Chronic respiratory failure with tracheostomy on 11/29/2021--uses nocturnal oxygen  OHSS BMI 60/3 obesity Previous pulmonary embolism and DVT - 2017 and then recurrence 12/2020 transition to lifelong Chronic diastolic HF last EF 55-60% on 10/25/2438 with moderately elevated PASP  [facility]/2023 with fall and inability to ambulate CXR showed deformity of L1 nondisplaced fracture right clavicular head nondisplaced fracture proximal left humerus x-ray of left femur showed displaced tibial plateau intercondylar fracture and tricompartmental degenerative changes left knee Initial ABG 7.21 pCO2 93 bicarb 37 placed on ventilator and changed to Shiley cuffed tracheostomy Trauma surgery consulted orthopedics consulted neurosurgery also consulted  transferred to hospitalist service 10/28/23  Plan   acute superimposed on chronic hypoxic/hypercarbic respiratory failure// OHSS unable to be off overnight ventilator remains hypercarbic-requires ongoing ventilator hs---no significant changes over the past several days Case management working with Medicaid to get approval which may take some time  Mechanical fall with acute fractures-left tibial plateau L1 vertebral as well as right clavicular fractures Has progressed with conservative management and does not require sling Will require home health OT PT given shoulder pain etc.-progressing fairly  HFpEF chronic Continues on Lasix  20 daily Aldactone  50 daily amlodipine  5 Blood pressure is relatively well-controlled Periodic labs only  Recurrent DVT/PE Continue Eliquis  5 twice daily lifelong  Hypokalemia Reluctant to use potassium replacement-meds adjusted as above to those doses--periodic labs and  mag as sometimes reluctant to take meds Next labs will be on Monday   DVT prophylaxis: eliquis   Status is: Inpatient Remains inpatient appropriate because:   Awaiting Trilogy vent --not safe to d/c without this    Subjective:  Sitting up in chair pleasant comfortable  Objective + exam Vitals:   11/20/23 0900 11/20/23 1000 11/20/23 1102 11/20/23 1200  BP:  (!) 134/92    Pulse: (!) 53 67    Resp: 15 (!) 21 (!) 22   Temp:    99.5 F (37.5 C)  TempSrc:    Oral  SpO2: 93% 95% 95%   Weight:      Height:       Filed Weights   11/16/23 0500 11/17/23 0705 11/20/23 0500  Weight: (!) 170.4 kg (!) 170.4 kg (!) 170.8 kg    Examination: Coherent awake alert in nad S1-s2 no murmurs some bradycardia noted on monitors occasionally more at night Chest is clear no wheeze Abdominal soft Slight upper thigh swelling  Data Reviewed: reviewed   CBC    Component Value Date/Time   WBC 4.3 11/05/2023 0638   RBC 4.54 11/05/2023 0638   HGB 12.9 11/08/2023 0351   HGB 13.3 06/17/2023 1112   HCT 38.0 11/08/2023 0351   HCT 42.3 06/17/2023 1112   PLT 204 11/05/2023 0638   PLT 239 06/17/2023 1112   MCV 94.7 11/05/2023 0638   MCV 95 06/17/2023 1112   MCH 29.3 11/05/2023 0638   MCHC 30.9 11/05/2023 0638   RDW 14.8 11/05/2023 0638   RDW 12.5 06/17/2023 1112   LYMPHSABS 0.8 10/27/2023 0512   LYMPHSABS 1.3 06/17/2023 1112   MONOABS 0.5 10/27/2023 0512   EOSABS 0.1 10/27/2023 0512   EOSABS 0.2 06/17/2023 1112   BASOSABS 0.0 10/27/2023 0512   BASOSABS 0.1 06/17/2023 1112  Latest Ref Rng & Units 11/19/2023    6:01 AM 11/18/2023    5:19 AM 11/17/2023    5:54 AM  CMP  Glucose 70 - 99 mg/dL 84  85  84   BUN 6 - 20 mg/dL 11  12  12    Creatinine 0.44 - 1.00 mg/dL 2.13  0.86  5.78   Sodium 135 - 145 mmol/L 139  140  140   Potassium 3.5 - 5.1 mmol/L 3.6  3.7  3.6   Chloride 98 - 111 mmol/L 98  101  100   CO2 22 - 32 mmol/L 32  31  31   Calcium  8.9 - 10.3 mg/dL 9.1  9.1  9.0      Scheduled Meds:  amLODipine   5 mg Oral Daily   apixaban   5 mg Oral BID   atorvastatin   20 mg Oral Daily   Chlorhexidine  Gluconate Cloth  6 each Topical Daily   furosemide   20 mg Oral Daily   mouth rinse  15 mL Mouth Rinse 4 times per day   pantoprazole   40 mg Oral QHS   sodium chloride  flush  3 mL Intravenous Q12H   spironolactone   50 mg Oral Daily   Continuous Infusions:  Time  24  Jai-Gurmukh Taelynn Mcelhannon, MD  [facility]

## 2023-11-20 NOTE — Progress Notes (Signed)
 Physical Therapy Treatment Patient Details Name: Angel Brewer MRN: [id number] DOB: 1966-06-03 Today's Date: 11/20/2023   History of Present Illness Pt is a 58 y.o. female presenting from home after a fall 4/10. Hypoxic and placed on NRB. Found to have acute superior endplate deformity of L1, R clavicular head fx, L displaced tibial plateau intercondylar fx. CTH with no acute findings, fx of L proximal humerus.  PMH significant for chronic hypoxic hypercarbic respiratory failure tracheostomy dependent since May 2023, HFpEF, OSA, PE and DVT on Eliquis , and morbid obesity (BMI 67)    PT Comments  Pt continues to demonstrate great motivation and participation in therapy. Session focused on standing AROM exercises with RUE and bilateral hip and hamstring strengthening in addition to continued mobility progression. Pt also able to ascend and descend 1 step with use of railing to simulate curb or step into garage. Will continue to follow acutely.     If plan is discharge home, recommend the following: Assistance with cooking/housework;Assist for transportation;Help with stairs or ramp for entrance;A Partch help with walking and/or transfers;A [place] help with bathing/dressing/bathroom   Can travel by private vehicle        Equipment Recommendations  None recommended by PT    Recommendations for Other Services       Precautions / Restrictions Precautions Precautions: None Restrictions Weight Bearing Restrictions Per Provider Order: No     Mobility  Bed Mobility Overal bed mobility: Needs Assistance Bed Mobility: Supine to Sit     Supine to sit: Supervision     General bed mobility comments: + use of rail, HOB elevated, increased time    Transfers Overall transfer level: Needs assistance Equipment used: None Transfers: Sit to/from Stand Sit to Stand: Supervision           General transfer comment: Pushing off with LUE, use of momentum     Ambulation/Gait Ambulation/Gait assistance: Supervision Gait Distance (Feet): 250 Feet Assistive device: None Gait Pattern/deviations: Decreased stride length, Step-through pattern, Wide base of support           Stairs Stairs: Yes Stairs assistance: Contact guard assist Stair Management: One rail Left, Sideways Number of Stairs: 1 General stair comments: Sideways for increased comfortability with knee pain, + use of rail   Wheelchair Mobility     Tilt Bed    Modified Rankin (Stroke Patients Only)       Balance Overall balance assessment: Needs assistance Sitting-balance support: No upper extremity supported, Feet supported Sitting balance-[name]: Good     Standing balance support: During functional activity, No upper extremity supported Standing balance-[name]: Good                              Communication Communication Communication: No apparent difficulties  Cognition Arousal: Alert Behavior During Therapy: WFL for tasks assessed/performed   PT - Cognitive impairments: No apparent impairments                         Following commands: Intact      Cueing Cueing Techniques: Verbal cues  Exercises Other Exercises Other Exercises: Standing: bilateral hamstring curls x 10 (x2 sets) with yellow theraband. Lateral steps to R/L x 15 ft in either direction (x 2 sets), with yellow theraband Other Exercises: Standing: functional reaching with RUE for AROM    General Comments        Pertinent Vitals/Pain Pain Assessment Pain Assessment:  Faces Faces Pain Scale: Hurts a Vawter bit Pain Location: right shoulder with AROM Pain Descriptors / Indicators: Discomfort, Grimacing Pain Intervention(s): Monitored during session    Home Living                          Prior Function            PT Goals (current goals can now be found in the care plan section) Acute Rehab PT Goals Patient Stated Goal: return to  independence Potential to Achieve Goals: Good Progress towards PT goals: Progressing toward goals    Frequency    Min 2X/week      PT Plan      Co-evaluation              AM-PAC PT "6 Clicks" Mobility   Outcome Measure  Help needed turning from your back to your side while in a flat bed without using bedrails?: None Help needed moving from lying on your back to sitting on the side of a flat bed without using bedrails?: None Help needed moving to and from a bed to a chair (including a wheelchair)?: None Help needed standing up from a chair using your arms (e.g., wheelchair or bedside chair)?: A Mervine Help needed to walk in hospital room?: A [place]ino Help needed climbing 3-5 steps with a railing? : A [place] 6 Click Score: 21    End of Session   Activity Tolerance: Patient tolerated treatment well Patient left: in chair;with call bell/phone within reach Nurse Communication: Mobility status PT Visit Diagnosis: Unsteadiness on feet (R26.81);Other abnormalities of gait and mobility (R26.89);Repeated falls (R29.6);Muscle weakness (generalized) (M62.81);Pain Pain - Right/Left: Right Pain - part of body: Shoulder     Time: 0925-1000 PT Time Calculation (min) (ACUTE ONLY): 35 min  Charges:    $Therapeutic Activity: 23-37 mins PT General Charges $$ ACUTE PT VISIT: 1 Visit                     [name], PT, DPT Acute Rehabilitation Services Office [phone]    [name] 11/20/2023, 10:46 AM

## 2023-11-21 NOTE — Progress Notes (Signed)
 [facility] ROUNDING NOTE Angel Brewer [id number]  DOB: 1966-05-08  DOA: 10/22/2023  PCP: [name], NP  11/21/2023,10:10 AM  LOS: 29 days    Code Status: Full code   From:  home   Current Dispo: unclear   58 year old black female Chronic respiratory failure with tracheostomy on 11/29/2021--uses nocturnal oxygen  OHSS BMI 60/3 obesity Previous pulmonary embolism and DVT - 2017 and then recurrence 12/2020 transition to lifelong Chronic diastolic HF last EF 55-60% on 4/78/2956 with moderately elevated PASP  Went to [facility] 10/22/2023 with fall and inability to ambulate CXR showed deformity of L1 nondisplaced fracture right clavicular head nondisplaced fracture proximal left humerus x-ray of left femur showed displaced tibial plateau intercondylar fracture and tricompartmental degenerative changes left knee Initial ABG 7.21 pCO2 93 bicarb 37 placed on ventilator and changed to Shiley cuffed tracheostomy Trauma surgery consulted orthopedics consulted neurosurgery also consulted  transferred to hospitalist service 10/28/23  Plan   acute superimposed on chronic hypoxic/hypercarbic respiratory failure// OHSS Mandatory overnight ventilator remains hypercarbic--no significant changes over the past several days Case management working with Medicaid to get approval which may take some time  Mechanical fall with acute fractures-left tibial plateau L1 vertebral as well as right clavicular fractures Has progressed with conservative management and does not require sling Will require home health OT PT given shoulder pain etc.-progressing fairly  HFpEF chronic Continues on Lasix  20 daily Aldactone  50 daily amlodipine  5 Blood pressure is relatively well-controlled Periodic labs only  Recurrent DVT/PE Continue Eliquis  5 twice daily lifelong  Hypokalemia Reluctant to use potassium replacement-meds adjusted as above to those doses--periodic labs and mag as sometimes reluctant to take  meds Next labs will be on Monday   DVT prophylaxis: eliquis   Status is: Inpatient Remains inpatient appropriate because:   Awaiting Trilogy vent --not safe to d/c without this    Subjective:  No new issues  Objective + exam Vitals:   11/21/23 0500 11/21/23 0548 11/21/23 0600 11/21/23 0800  BP:  124/78  107/70  Pulse:  (!) 50 (!) 46 (!) 54  Resp:  16 17 18   Temp:    97.6 F (36.4 C)  TempSrc:    Oral  SpO2:  96% 96% 98%  Weight: (!) 177.4 kg  (!) 168.7 kg   Height:       Filed Weights   11/20/23 0500 11/21/23 0500 11/21/23 0600  Weight: (!) 170.8 kg (!) 177.4 kg (!) 168.7 kg    Examination:  Coherent awake alert in nad S1-s2 no murmurs some bradycardia noted on monitors occasionally more at night Chest is clear no wheeze Trace LE edema  Data Reviewed: reviewed   CBC    Component Value Date/Time   WBC 4.3 11/05/2023 0638   RBC 4.54 11/05/2023 0638   HGB 12.9 11/08/2023 0351   HGB 13.3 06/17/2023 1112   HCT 38.0 11/08/2023 0351   HCT 42.3 06/17/2023 1112   PLT 204 11/05/2023 0638   PLT 239 06/17/2023 1112   MCV 94.7 11/05/2023 0638   MCV 95 06/17/2023 1112   MCH 29.3 11/05/2023 0638   MCHC 30.9 11/05/2023 0638   RDW 14.8 11/05/2023 0638   RDW 12.5 06/17/2023 1112   LYMPHSABS 0.8 10/27/2023 0512   LYMPHSABS 1.3 06/17/2023 1112   MONOABS 0.5 10/27/2023 0512   EOSABS 0.1 10/27/2023 0512   EOSABS 0.2 06/17/2023 1112   BASOSABS 0.0 10/27/2023 0512   BASOSABS 0.1 06/17/2023 1112      Latest Ref Rng &  Units 11/19/2023    6:01 AM 11/18/2023    5:19 AM 11/17/2023    5:54 AM  CMP  Glucose 70 - 99 mg/dL 84  85  84   BUN 6 - 20 mg/dL 11  12  12    Creatinine 0.44 - 1.00 mg/dL 1.61  0.96  0.45   Sodium 135 - 145 mmol/L 139  140  140   Potassium 3.5 - 5.1 mmol/L 3.6  3.7  3.6   Chloride 98 - 111 mmol/L 98  101  100   CO2 22 - 32 mmol/L 32  31  31   Calcium  8.9 - 10.3 mg/dL 9.1  9.1  9.0     Scheduled Meds:  amLODipine   5 mg Oral Daily   apixaban   5 mg  Oral BID   atorvastatin   20 mg Oral Daily   Chlorhexidine  Gluconate Cloth  6 each Topical Daily   furosemide   20 mg Oral Daily   mouth rinse  15 mL Mouth Rinse 4 times per day   pantoprazole   40 mg Oral QHS   sodium chloride  flush  3 mL Intravenous Q12H   spironolactone   50 mg Oral Daily   Continuous Infusions:  Time  24  Jai-Gurmukh Ryelan Kazee, MD  [facility]

## 2023-11-22 NOTE — Progress Notes (Signed)
 Overall no changes Awaiting Trilogy vent approval  BP 131/86 (BP Location: Left Wrist)   Pulse (!) 56   Temp 98 F (36.7 C) (Oral)   Resp 19   Ht 5\' 6"  (1.676 m)   Wt (!) 169.4 kg   LMP 11/29/2015   SpO2 93%   BMI 60.28 kg/m  Cta b no wheeze Some slight LE edema  P As per prior--labs in am

## 2023-11-23 LAB — BASIC METABOLIC PANEL WITH GFR
Anion gap: 5 (ref 5–15)
BUN: 10 mg/dL (ref 6–20)
CO2: 29 mmol/L (ref 22–32)
Calcium: 8.8 mg/dL — ABNORMAL LOW (ref 8.9–10.3)
Chloride: 104 mmol/L (ref 98–111)
Creatinine, Ser: 0.96 mg/dL (ref 0.44–1.00)
GFR, Estimated: >60 mL/min (ref >60)
Glucose, Bld: 88 mg/dL (ref 70–99)
Potassium: 4.1 mmol/L (ref 3.5–5.1)
Sodium: 138 mmol/L (ref 135–145)

## 2023-11-23 NOTE — Progress Notes (Signed)
 Physical Therapy Treatment Patient Details Name: Angel Brewer MRN: [id number] DOB: 02-Nov-1965 Today's Date: 11/23/2023   History of Present Illness Pt is a 58 y.o. female presenting from home after a fall 4/10. Hypoxic and placed on NRB. Found to have acute superior endplate deformity of L1, R clavicular head fx, L displaced tibial plateau intercondylar fx. CTH with no acute findings, fx of L proximal humerus.  PMH significant for chronic hypoxic hypercarbic respiratory failure tracheostomy dependent since May 2023, HFpEF, OSA, PE and DVT on Eliquis , and morbid obesity (BMI 67)    PT Comments  Session focused on RUE table slides and isometrics for strengthening in standing position to improve standing tolerance. Pt subsequently ambulating 500 ft with no assistive device without physical difficulty. SpO2 87% on RA towards end of walk. Will continue to follow acutely.     If plan is discharge home, recommend the following: Assistance with cooking/housework;Assist for transportation;Help with stairs or ramp for entrance;A Broers help with walking and/or transfers;A [place] help with bathing/dressing/bathroom   Can travel by private vehicle        Equipment Recommendations  None recommended by PT    Recommendations for Other Services       Precautions / Restrictions Precautions Precautions: None Restrictions Weight Bearing Restrictions Per Provider Order: No RUE Weight Bearing Per Provider Order: Non weight bearing LUE Weight Bearing Per Provider Order: Weight bearing as tolerated LLE Weight Bearing Per Provider Order: Weight bearing as tolerated Other Position/Activity Restrictions: Per ortho on 4/11: NWB but if she can tolerate using the RUE for transfers she is allowed     Mobility  Bed Mobility               General bed mobility comments: OOB in chair    Transfers Overall transfer level: Modified independent Equipment used: None                     Ambulation/Gait Ambulation/Gait assistance: Supervision Gait Distance (Feet): 500 Feet Assistive device: None Gait Pattern/deviations: Decreased stride length, Step-through pattern, Wide base of support           Stairs             Wheelchair Mobility     Tilt Bed    Modified Rankin (Stroke Patients Only)       Balance Overall balance assessment: Needs assistance Sitting-balance support: No upper extremity supported, Feet supported Sitting balance-[name]: Good     Standing balance support: During functional activity, No upper extremity supported Standing balance-[name]: Good                              Communication Communication Communication: No apparent difficulties Factors Affecting Communication: [name] valve  Cognition Arousal: Alert Behavior During Therapy: WFL for tasks assessed/performed   PT - Cognitive impairments: No apparent impairments                         Following commands: Intact      Cueing Cueing Techniques: Verbal cues  Exercises Other Exercises Other Exercises: Standing: RUE table slides (frontal and transverse planes), RUE isometric strengthening (flexion, extension, abduction, IR) x 30 s each    General Comments        Pertinent Vitals/Pain Pain Assessment Pain Assessment: Faces Faces Pain Scale: No hurt    Home Living  Prior Function            PT Goals (current goals can now be found in the care plan section) Acute Rehab PT Goals Patient Stated Goal: return to independence Potential to Achieve Goals: Good Progress towards PT goals: Progressing toward goals    Frequency    Min 2X/week      PT Plan      Co-evaluation              AM-PAC PT "6 Clicks" Mobility   Outcome Measure  Help needed turning from your back to your side while in a flat bed without using bedrails?: None Help needed moving from lying on your  back to sitting on the side of a flat bed without using bedrails?: None Help needed moving to and from a bed to a chair (including a wheelchair)?: None Help needed standing up from a chair using your arms (e.g., wheelchair or bedside chair)?: None Help needed to walk in hospital room?: A [place] Help needed climbing 3-5 steps with a railing? : A [place]ley 6 Click Score: 22    End of Session   Activity Tolerance: Patient tolerated treatment well Patient left: in chair;with call bell/phone within reach Nurse Communication: Mobility status PT Visit Diagnosis: Unsteadiness on feet (R26.81);Other abnormalities of gait and mobility (R26.89);Repeated falls (R29.6);Muscle weakness (generalized) (M62.81);Pain Pain - Right/Left: Right Pain - part of body: Shoulder     Time: 1510-1534 PT Time Calculation (min) (ACUTE ONLY): 24 min  Charges:    $Therapeutic Activity: 23-37 mins PT General Charges $$ ACUTE PT VISIT: 1 Visit                     [name], PT, DPT Acute Rehabilitation Services Office [phone]    [name] 11/23/2023, 4:04 PM

## 2023-11-23 NOTE — Progress Notes (Signed)
 11/23/2023    I have seen and evaluated the patient for resp failure.   S:  No events. Sleeping better with vent at bedtime.   O: Blood pressure 96/66, pulse 64, temperature 97.6 F (36.4 C), temperature source Oral, resp. rate 17, height 5\' 3"  (1.6 m), weight 81.6 kg, SpO2 94 %.    No distress Lungs diminished Moves to command Trace edema vs. Adipose tissue  BMP looks fine   A:  OHS/OSA with need for at bedtime ventilation awaiting home trilogy approval   P:  Continue at bedtime vent support Mobility as able Will check on weekly (next 5/19) Let us  know when closer to DC and we can set up trach clinic f/u   Ardelle Kos MD  Pulmonary Critical Care Prefer epic messenger for cross cover needs If after hours, please call E-link

## 2023-11-23 NOTE — Progress Notes (Addendum)
 RT called to place pt on ventilator for the night due to desaturations. Upon arrival pt SpO2 on room air 86%, no distress noted. Pt addiment at this time that she is not going on the ventilator prior to 11 pm and she won't wear the aerosol trach collar with oxygen  either. Pt states the ATC with oxygen  only makes her cold, wet and does nothing for her oxygen .  RT provided education to pt and importance of her oxygen  maintaining at adequate levels. Pt verbalizes understanding and continues to decline vent/ATC. Spo2 currently 93%. RT will continue to monitor and be available as needed.

## 2023-11-24 DIAGNOSIS — T07XXXA Unspecified multiple injuries, initial encounter: Secondary | ICD-10-CM | POA: Diagnosis not present

## 2023-11-24 NOTE — Progress Notes (Signed)
 TRH ROUNDING NOTE Angel Brewer ZOX:[id number]  DOB: [birth year]  DOA: 10/22/2023  PCP: [name], NP  11/24/2023,1:57 PM  LOS: 32 days    Code Status: Full code   From:  home   Current Dispo: unclear   58 year old black female Chronic respiratory failure with tracheostomy on 11/29/2021--uses nocturnal oxygen  OHSS BMI 60/3 obesity Previous pulmonary embolism and DVT - 2017 and then recurrence 12/2020 transition to lifelong Chronic diastolic HF last EF 55-60% on 02/21/9146 with moderately elevated PASP  Went to [facility] 10/22/2023 with fall and inability to ambulate CXR showed deformity of L1 nondisplaced fracture right clavicular head nondisplaced fracture proximal left humerus x-ray of left femur showed displaced tibial plateau intercondylar fracture and tricompartmental degenerative changes left knee Initial ABG 7.21 pCO2 93 bicarb 37 placed on ventilator and changed to Shiley cuffed tracheostomy Trauma surgery consulted orthopedics consulted neurosurgery also consulted  transferred to hospitalist service 10/28/23  Plan   acute superimposed on chronic hypoxic/hypercarbic respiratory failure// OHSS Mandatory overnight ventilator remains hypercarbic--no significant changes over the past several days It looks like her trilogy vent has been approved so we will await this to be delivered and then we can plan for discharge  Mechanical fall with acute fractures-left tibial plateau L1 vertebral as well as right clavicular fractures Has progressed with conservative management and does not require sling Will require home health OT PT given shoulder pain etc.-progressing fairly  HFpEF chronic Continues on Lasix  20 daily Aldactone  50 daily amlodipine  5-meds had to be adjusted secondary to her reluctance to get potassium replacement Blood pressure is relatively well-controlled Periodic labs only  Recurrent DVT/PE Continue Eliquis  5 twice daily lifelong  Hypokalemia Reluctant  to use potassium replacement-last labs performed 5/12 shows normal potassium Next labs on Thursday and adjust meds as able   DVT prophylaxis: eliquis   Status is: Inpatient Remains inpatient appropriate because:   Awaiting Trilogy vent --not safe to d/c without this    Subjective:  Overall well no distress  Objective + exam Vitals:   11/24/23 0800 11/24/23 0812 11/24/23 1055 11/24/23 1139  BP: 116/80  116/80   Pulse: (!) 49 62 66 66  Resp: 14 14 14 20   Temp: 97.8 F (36.6 C)   99 F (37.2 C)  TempSrc: Oral   Oral  SpO2: 94% 93% 93% 92%  Weight:      Height:       Filed Weights   11/22/23 0221 11/23/23 0500 11/24/23 0500  Weight: (!) 169.4 kg (!) 168.1 kg (!) 168.6 kg    Examination:  Coherent awake alert in nad S1-s2 no murmurs some bradycardia noted on monitors occasionally more at night Abdominal is obese nontender no rebound Chest is clear no wheeze Trace LE edema-mainly localized to the thighs  Data Reviewed: reviewed   CBC    Component Value Date/Time   WBC 4.3 11/05/2023 0638   RBC 4.54 11/05/2023 0638   HGB 12.9 11/08/2023 0351   HGB 13.3 06/17/2023 1112   HCT 38.0 11/08/2023 0351   HCT 42.3 06/17/2023 1112   PLT 204 11/05/2023 0638   PLT 239 06/17/2023 1112   MCV 94.7 11/05/2023 0638   MCV 95 06/17/2023 1112   MCH 29.3 11/05/2023 0638   MCHC 30.9 11/05/2023 0638   RDW 14.8 11/05/2023 0638   RDW 12.5 06/17/2023 1112   LYMPHSABS 0.8 10/27/2023 0512   LYMPHSABS 1.3 06/17/2023 1112   MONOABS 0.5 10/27/2023 0512   EOSABS 0.1 10/27/2023 0512  EOSABS 0.2 06/17/2023 1112   BASOSABS 0.0 10/27/2023 0512   BASOSABS 0.1 06/17/2023 1112      Latest Ref Rng & Units 11/23/2023    6:12 AM 11/19/2023    6:01 AM 11/18/2023    5:19 AM  CMP  Glucose 70 - 99 mg/dL 88  84  85   BUN 6 - 20 mg/dL 10  11  12    Creatinine 0.44 - 1.00 mg/dL 6.29  5.28  4.13   Sodium 135 - 145 mmol/L 138  139  140   Potassium 3.5 - 5.1 mmol/L 4.1  3.6  3.7   Chloride 98 - 111  mmol/L 104  98  101   CO2 22 - 32 mmol/L 29  32  31   Calcium  8.9 - 10.3 mg/dL 8.8  9.1  9.1     Scheduled Meds:  amLODipine   5 mg Oral Daily   apixaban   5 mg Oral BID   atorvastatin   20 mg Oral Daily   Chlorhexidine  Gluconate Cloth  6 each Topical Daily   furosemide   20 mg Oral Daily   mouth rinse  15 mL Mouth Rinse 4 times per day   pantoprazole   40 mg Oral QHS   sodium chloride  flush  3 mL Intravenous Q12H   spironolactone   50 mg Oral Daily   Continuous Infusions:  Time  14  Jai-Gurmukh Jazman Reuter, MD  [facility]

## 2023-11-24 NOTE — Progress Notes (Signed)
 Late entry 5/12  No changes to condition Comofrotable Continues to await Vent procurment CM aware  No charge  Abbie Hock, MD [facility] 11:14 AM

## 2023-11-24 NOTE — Progress Notes (Signed)
 Pt taken off the ventilator at this time per request. Cuff deflated and pt placed PMV without complication. RT will continue to monitor and be available as needed.

## 2023-11-24 NOTE — Progress Notes (Signed)
 Pt placed on full support vent settings for the night without complication.

## 2023-11-24 NOTE — TOC Progression Note (Addendum)
 Transition of Care [facility]) - Progression Note    Patient Details  Name: Angel Brewer MRN: [id number] Date of Birth: [birth year]  [facility] CM/SW Contact  Treyshon Buchanon M, RN Phone Number: 11/24/2023, 10:06 AM  Clinical Narrative:    Received message from [place] financial counseling that patient has been approved for Medicaid, ID# [id number] R.   Message to Zach with [facility] to initiate obtaining Trilogy vent. He plans to meet with Case Manager and medical team tomorrow at 11:30am.     1530 Addendum: Met with patient to discuss plans for home vent; we discussed who will be available to provide assistance to her with the vent, and who will need to have teaching provided.  She had originally told TOC that her niece would move in with her at dc,and would be agreeable to be taught to manage the vent.  Patient stated, "I can do this myself.  I don't need her to go home with me.  She can't come in for teaching because she doesn't have transportation. "  I reminded patient of our prior discussion, and she says she does not need her anymore.  She plans to do it herself.  Await follow up with [facility] in AM. They estimate that vent may be available on Thursday pending requirements met for home vent.     Expected Discharge Plan: Home w Home Health Services Barriers to Discharge: Inadequate or no insurance  Expected Discharge Plan and Services   Discharge Planning Services: CM Consult   Living arrangements for the past 2 months: Single Family Home                                       Social Determinants of Health (SDOH) Interventions SDOH Screenings   Food Insecurity: No Food Insecurity (10/26/2023)  Housing: Low Risk  (10/26/2023)  Transportation Needs: No Transportation Needs (10/26/2023)  Utilities: Not At Risk (10/26/2023)  Depression (PHQ2-9): Low Risk  (06/17/2023)  Financial Resource Strain: Medium Risk (06/16/2023)  Physical Activity:  Sufficiently Active (06/16/2023)  Social Connections: Socially Isolated (06/16/2023)  Stress: No Stress Concern Present (06/16/2023)  Tobacco Use: Medium Risk (10/23/2023)  Health Literacy: Adequate Health Literacy (06/17/2023)    Readmission Risk Interventions    09/03/2023    1:17 PM 12/20/2021   11:33 AM  Readmission Risk Prevention Plan  Post Dischage Appt Complete   Medication Screening Complete   Transportation Screening Complete Complete  PCP or Specialist Appt within 3-5 Days  Complete  HRI or Home Care Consult  Complete  Social Work Consult for Recovery Care Planning/Counseling  Complete  Palliative Care Screening  Not Applicable  Medication Review (RN Care Manager)  Complete   Calla Catchings, RN, BSN  Trauma/Neuro ICU Case Manager [phone]

## 2023-11-24 NOTE — Plan of Care (Signed)
  Problem: Metabolic: Goal: Ability to maintain appropriate glucose levels will improve Outcome: Progressing   Problem: Nutritional: Goal: Maintenance of adequate nutrition will improve Outcome: Progressing   Problem: Skin Integrity: Goal: Risk for impaired skin integrity will decrease Outcome: Progressing   Problem: Tissue Perfusion: Goal: Adequacy of tissue perfusion will improve Outcome: Progressing   Problem: Health Behavior/Discharge Planning: Goal: Ability to manage health-related needs will improve Outcome: Progressing   Problem: Clinical Measurements: Goal: Ability to maintain clinical measurements within normal limits will improve Outcome: Progressing Goal: Will remain free from infection Outcome: Progressing Goal: Diagnostic test results will improve Outcome: Progressing Goal: Respiratory complications will improve Outcome: Progressing   Problem: Activity: Goal: Risk for activity intolerance will decrease Outcome: Progressing   Problem: Nutrition: Goal: Adequate nutrition will be maintained Outcome: Progressing   Problem: Coping: Goal: Level of anxiety will decrease Outcome: Progressing   Problem: Elimination: Goal: Will not experience complications related to bowel motility Outcome: Progressing Goal: Will not experience complications related to urinary retention Outcome: Progressing   Problem: Safety: Goal: Ability to remain free from injury will improve Outcome: Progressing   Problem: Skin Integrity: Goal: Risk for impaired skin integrity will decrease Outcome: Progressing

## 2023-11-24 NOTE — Progress Notes (Signed)
 Occupational Therapy Treatment Patient Details Name: Angel Brewer MRN: [id number] DOB: 1965-07-17 Today's Date: 11/24/2023   History of present illness Pt is a 58 y.o. female presenting from home after a fall 4/10. Hypoxic and placed on NRB. Found to have acute superior endplate deformity of L1, R clavicular head fx, L displaced tibial plateau intercondylar fx. CTH with no acute findings, fx of L proximal humerus.  PMH significant for chronic hypoxic hypercarbic respiratory failure tracheostomy dependent since May 2023, HFpEF, OSA, PE and DVT on Eliquis , and morbid obesity (BMI 67)   OT comments  Pt met 2 goals this session. Pt feels all peri care questions are answered. Pt plans to look online about purchase of bidet attachment for home use. Recommendation OT needs upon d/c at this time.        If plan is discharge home, recommend the following:  A Holz help with bathing/dressing/bathroom   Equipment Recommendations  None recommended by OT    Recommendations for Other Services      Precautions / Restrictions Precautions Precautions: None Precaution/Restrictions Comments: watch SpO2, on trach collar Restrictions Weight Bearing Restrictions Per Provider Order: No RUE Weight Bearing Per Provider Order: Non weight bearing LUE Weight Bearing Per Provider Order: Weight bearing as tolerated LLE Weight Bearing Per Provider Order: Weight bearing as tolerated Other Position/Activity Restrictions: Per ortho on 4/11: NWB but if she can tolerate using the RUE for transfers she is allowed       Mobility Bed Mobility               General bed mobility comments: oob to chair    Transfers Overall transfer level: Modified independent                       Balance                                           ADL either performed or assessed with clinical judgement   ADL Overall ADL's : Needs assistance/impaired Eating/Feeding: Modified  independent                             Toileting - Clothing Manipulation Details (indicate cue type and reason): focused on hygiene and educated on use of bidet attachment and tongs. pt has tongs at home but says "i didnt know how to use them"   Tub/Shower Transfer Details (indicate cue type and reason): talked about shower attachement for hygiene and currently has been using it every morning for this purpose   General ADL Comments: feels like she has all her questions answered at this time for hygiene and peri care    Extremity/Trunk Assessment              Vision       Perception     Praxis     Communication Communication Communication: No apparent difficulties Factors Affecting Communication: [name] valve   Cognition Arousal: Alert Behavior During Therapy: [facility] for tasks assessed/performed Cognition: No apparent impairments                                        Cueing      Exercises      Shoulder  Instructions       General Comments ra pmv    Pertinent Vitals/ Pain       Pain Assessment Pain Assessment: No/denies pain  Home Living                                          Prior Functioning/Environment              Frequency  Min 3X/week        Progress Toward Goals  OT Goals(current goals can now be found in the care plan section)  Progress towards OT goals: Progressing toward goals  Acute Rehab OT Goals Patient Stated Goal: to be abel to return home OT Goal Formulation: With patient Time For Goal Achievement: 12/08/23 Potential to Achieve Goals: Good ADL Goals Pt Will Perform Grooming:  (met) Pt Will Perform Lower Body Dressing: with modified independence;sit to/from stand;with adaptive equipment Pt Will Transfer to Toilet: ambulating;with modified independence (met) Pt/caregiver will Perform Home Exercise Program: Increased strength;With written HEP provided;With Supervision  Plan       Co-evaluation                 AM-PAC OT "6 Clicks" Daily Activity     Outcome Measure   Help from another person eating meals?: None Help from another person taking care of personal grooming?: None Help from another person toileting, which includes using toliet, bedpan, or urinal?: A Noe Help from another person bathing (including washing, rinsing, drying)?: A Coate Help from another person to put on and taking off regular upper body clothing?: None Help from another person to put on and taking off regular lower body clothing?: A Milne 6 Click Score: 21    End of Session    OT Visit Diagnosis: Unsteadiness on feet (R26.81);Muscle weakness (generalized) (M62.81);History of falling (Z91.81);Pain Pain - Right/Left: Right Pain - part of body: Shoulder   Activity Tolerance Patient tolerated treatment well   Patient Left in chair;with call bell/phone within reach   Nurse Communication Mobility status;Precautions        Time: 1610-9604 OT Time Calculation (min): 15 min  Charges: OT General Charges $OT Visit: 1 Visit OT Treatments $Self Care/Home Management : 8-22 mins   Angel Brewer, OTR/L  Acute Rehabilitation Services Office: [phone] .   Neomia Banner 11/24/2023, 3:50 PM

## 2023-11-25 DIAGNOSIS — T07XXXA Unspecified multiple injuries, initial encounter: Secondary | ICD-10-CM | POA: Diagnosis not present

## 2023-11-25 NOTE — Progress Notes (Signed)
 [facility] PROGRESS NOTE  Patient: Angel Brewer ZOX:[id number]   PCP: [name], NP DOB: 1965/12/30   DOA: 10/22/2023   DOS: 11/25/2023    Subjective: Denies any acute complaint.  No nausea no vomiting no fever no chills.  Reports that her tracheostomy is causing a lot of irritation and pain.  Objective:  Vitals:   11/25/23 1232 11/25/23 1248 11/25/23 1510 11/25/23 1600  BP:      Pulse: 70 86 81   Resp: (!) 22 20 14    Temp:    97.7 F (36.5 C)  TempSrc:    Oral  SpO2: 94% 94% 94%   Weight:      Height:       Clear to auscultation. S1-S2 present.  No significant edema.  Assessment and plan: Acute on chronic hypoxic and hypercarbic respiratory failure secondary to obesity hypoventilation syndrome. Continue vent overnight.  Awaiting arrangement of home ventilator.  Will follow recommendation from TOC.  Status post tracheostomy. Discussed with pulmonary.  Trach changed on 5/14. Has #6 cuffed Shiley.  Author: Charlean Congress, MD [facility] 11/25/2023 6:46 PM   If 7PM-7AM, please contact night-coverage at [email or web address]

## 2023-11-25 NOTE — Procedures (Signed)
 Tracheostomy Change Note  Patient Details:   Name: Angel Brewer DOB: 1965/10/11 MRN: [id number]    Airway Documentation:     Evaluation  O2 sats: stable throughout Complications: No apparent complications Patient did tolerate procedure well. Bilateral Breath Sounds: Clear, Diminished  Patient's trach was changed to a #6 cuffed Shiley without any complications with 2 RT's. Angel Brewer was secured with new trach ties. Positive color change noted on CO2 detector.     Worley Headings 11/25/2023, 12:57 PM

## 2023-11-25 NOTE — TOC Progression Note (Signed)
 Transition of Care [facility]) - Progression Note    Patient Details  Name: Angel Brewer MRN: [id number] Date of Birth: [birth year]  Transition of Care [facility] CM/SW Contact  Joyanna Kleman M, RN Phone Number: 11/25/2023, 1100am  Clinical Narrative:    Patient approved for Medicaid ID# [id number] Pasquale Bones with [facility]  arrived this am; he has all orders and documentation required, and has met with patient. He plans to come back on Thursday with vent to provide discharge teaching to patient.  Glyn Laser with [facility] [employer]  Phone: [phone]   Expected Discharge Plan: Home w Home Health Services Barriers to Discharge: Inadequate or no insurance  Expected Discharge Plan and Services   Discharge Planning Services: CM Consult   Living arrangements for the past 2 months: Single Family Home                                       Social Determinants of Health (SDOH) Interventions SDOH Screenings   Food Insecurity: No Food Insecurity (10/26/2023)  Housing: Low Risk  (10/26/2023)  Transportation Needs: No Transportation Needs (10/26/2023)  Utilities: Not At Risk (10/26/2023)  Depression (PHQ2-9): Low Risk  (06/17/2023)  Financial Resource Strain: Medium Risk (06/16/2023)  Physical Activity: Sufficiently Active (06/16/2023)  Social Connections: Socially Isolated (06/16/2023)  Stress: No Stress Concern Present (06/16/2023)  Tobacco Use: Medium Risk (10/23/2023)  Health Literacy: Adequate Health Literacy (06/17/2023)    Readmission Risk Interventions    09/03/2023    1:17 PM 12/20/2021   11:33 AM  Readmission Risk Prevention Plan  Post Dischage Appt Complete   Medication Screening Complete   Transportation Screening Complete Complete  PCP or Specialist Appt within 3-5 Days  Complete  HRI or Home Care Consult  Complete  Social Work Consult for Recovery Care Planning/Counseling  Complete  Palliative Care Screening  Not Applicable  Medication Review  (RN Care Manager)  Complete   Calla Catchings, RN, BSN  Trauma/Neuro ICU Case Manager [phone]

## 2023-11-25 NOTE — Progress Notes (Signed)
 Physical Therapy Treatment Patient Details Name: Angel Brewer MRN: [id number] DOB: 03/06/1966 Today's Date: 11/25/2023   History of Present Illness Pt is a 58 y.o. female presenting from home after a fall 4/10. Hypoxic and placed on NRB. Found to have acute superior endplate deformity of L1, R clavicular head fx, L displaced tibial plateau intercondylar fx. CTH with no acute findings, fx of L proximal humerus.  PMH significant for chronic hypoxic hypercarbic respiratory failure tracheostomy dependent since May 2023, HFpEF, OSA, PE and DVT on Eliquis , and morbid obesity (BMI 67)    PT Comments  Session focused on ball toss with RUE to address ROM and standing tolerance and progressive ambulation distance. Pt ambulating 600 ft with no assistive device without physical difficulty. SpO2 95% on RA. Pt eager for potential d/c home soon.    If plan is discharge home, recommend the following: Assistance with cooking/housework;Assist for transportation;Help with stairs or ramp for entrance;A [place] help with walking and/or transfers;A [place] help with bathing/dressing/bathroom   Can travel by private vehicle        Equipment Recommendations  None recommended by PT    Recommendations for Other Services       Precautions / Restrictions Precautions Precautions: None Recall of Precautions/Restrictions: Intact Restrictions Weight Bearing Restrictions Per Provider Order: Yes RUE Weight Bearing Per Provider Order: Non weight bearing LUE Weight Bearing Per Provider Order: Weight bearing as tolerated LLE Weight Bearing Per Provider Order: Weight bearing as tolerated Other Position/Activity Restrictions: Per ortho on 4/11: NWB but if she can tolerate using the RUE for transfers she is allowed     Mobility  Bed Mobility               General bed mobility comments: oob to chair    Transfers Overall transfer level: Modified independent Equipment used: None                     Ambulation/Gait Ambulation/Gait assistance: Modified independent (Device/Increase time) Gait Distance (Feet): 600 Feet Assistive device: None Gait Pattern/deviations: Decreased stride length, Step-through pattern, Wide base of support           Stairs             Wheelchair Mobility     Tilt Bed    Modified Rankin (Stroke Patients Only)       Balance Overall balance assessment: Needs assistance Sitting-balance support: No upper extremity supported, Feet supported Sitting balance-[name]: Good     Standing balance support: During functional activity, No upper extremity supported Standing balance-[name]: Good                              Communication Communication Communication: No apparent difficulties  Cognition Arousal: Alert Behavior During Therapy: WFL for tasks assessed/performed   PT - Cognitive impairments: No apparent impairments                         Following commands: Intact      Cueing Cueing Techniques: Verbal cues  Exercises Other Exercises Other Exercises: Standing: ball toss with RUE x 40    General Comments        Pertinent Vitals/Pain Pain Assessment Pain Assessment: Faces Faces Pain Scale: No hurt    Home Living  Prior Function            PT Goals (current goals can now be found in the care plan section) Acute Rehab PT Goals Patient Stated Goal: return to independence Potential to Achieve Goals: Good Progress towards PT goals: Progressing toward goals    Frequency    Min 2X/week      PT Plan      Co-evaluation              AM-PAC PT "6 Clicks" Mobility   Outcome Measure  Help needed turning from your back to your side while in a flat bed without using bedrails?: None Help needed moving from lying on your back to sitting on the side of a flat bed without using bedrails?: None Help needed moving to and from a bed to a chair  (including a wheelchair)?: None Help needed standing up from a chair using your arms (e.g., wheelchair or bedside chair)?: None Help needed to walk in hospital room?: None Help needed climbing 3-5 steps with a railing? : A Gary 6 Click Score: 23    End of Session   Activity Tolerance: Patient tolerated treatment well Patient left: in chair;with call bell/phone within reach Nurse Communication: Mobility status PT Visit Diagnosis: Unsteadiness on feet (R26.81);Other abnormalities of gait and mobility (R26.89);Repeated falls (R29.6);Muscle weakness (generalized) (M62.81);Pain Pain - Right/Left: Right Pain - part of body: Shoulder     Time: 1610-9604 PT Time Calculation (min) (ACUTE ONLY): 19 min  Charges:    $Therapeutic Activity: 8-22 mins PT General Charges $$ ACUTE PT VISIT: 1 Visit                     [name], PT, DPT Acute Rehabilitation Services Office [phone]    [name] 11/25/2023, 4:14 PM

## 2023-11-26 MED ORDER — FUROSEMIDE 20 MG PO TABS
20.0000 mg | ORAL_TABLET | Freq: Every day | ORAL | Status: DC
Start: 2023-11-27 — End: 2023-12-18

## 2023-11-26 MED ORDER — POLYETHYLENE GLYCOL 3350 17 G PO PACK: 17.0000 g | PACK | Freq: Every day | ORAL | 0 refills | Status: DC | PRN

## 2023-11-26 MED ORDER — PANTOPRAZOLE SODIUM 40 MG PO TBEC: 40.0000 mg | DELAYED_RELEASE_TABLET | Freq: Every day | ORAL | 0 refills | Status: DC

## 2023-11-26 NOTE — Discharge Summary (Signed)
 Physician Discharge Summary   Patient: Angel Brewer MRN: [id number] DOB: 11-01-1965  Admit date:     10/22/2023  Discharge date: 11/26/2023  Discharge Physician: Charlean Congress  PCP: [name], NP  Recommendations at discharge: Follow-up with PCP.  Pulmonary as recommended.   Follow-up Information     [name], NP. Schedule an appointment as soon as possible for a visit in 1 week(s).   Specialty: Nurse Practitioner Contact information: [address] [place] [address] [phone]         [facility]. Schedule an appointment as soon as possible for a visit in 2 week(s).   Specialty: Pulmonology Contact information: [address] [place] [address] [place]  [address] [phone] Additional information: [address]  [address]  [place], [place] [address]               Discharge Diagnoses: Principal Problem:   Multiple fracture Active Problems:   Chronic hypoxic respiratory failure (HCC)   Acute metabolic encephalopathy   History of pulmonary embolism   Benign essential HTN   Acute hypoxemic respiratory failure (HCC)   History of CHF (congestive heart failure)   OSA on CPAP   History of DVT (deep vein thrombosis)   Fall at home, initial encounter   Acute respiratory failure with hypoxia and hypercarbia (HCC)   Tracheostomy dependence [facility])   [facility] Course: 58 year old female with chronic hypercarbic respiratory failure, trach dependent since May 2023, chronic diastolic CHF, OSA on CPAP at bedtime, PE and DVT on Eliquis , comes into the hospital with worsening respiratory status after mechanical fall with multiple fractures (right clavicle, left tibial plateau, L1 vertebral fractures).    Assessment and Plan: Acute on chronic hypoxic and hypercarbic respiratory failure, chronic tracheal stenosis status, chronic OSA/obesity hypoventilation syndrome  -Patient continues to  tolerate nocturnal ventilator quite well. -Previously attempted to wean patient off overnight ventilator but has remained markedly hypercarbic  -thus continues to meet criteria for ongoing ventilator needs -Appreciate pulmonology follow-up/recommendations   Acute mechanical fall, multiple fractures Ambulatory dysfunction, improving Including right clavicle, left tibial plateau, L1 vertebral fractures.   Trauma, neurosurgery, orthopedic surgery evaluated patient, for now continue conservative management.   Seems to have improved.  No longer requiring sling.  Has worked well with physical therapy over the past week essentially independent at this point   Hypokalemia Potassium corrected with dietary changes and spironolactone ,   History of recurrent PE/DVT continue Eliquis    Chronic diastolic CHF Volume status adequate. Continue current regimen.   Essential hypertension- continue medications as below, blood pressure acceptable  Hyperlipidemia- continue statin  Obesity, morbid Body mass index is 59.6 kg/m.  Placing the patient at high risk of poor outcome. Weight appears to have improved in the hospital with diuresis.  Consultants:  Pulmonary Neurosurgery Orthopedics General Surgery ICU  Procedures performed:  Tracheostomy change  DISCHARGE MEDICATION: Allergies as of 11/26/2023   No Known Allergies      Medication List     TAKE these medications    amLODipine  5 MG tablet Commonly known as: NORVASC  Take 1 tablet (5 mg total) by mouth daily.   apixaban  5 MG Tabs tablet Commonly known as: ELIQUIS  Take 1 tablet (5 mg total) by mouth 2 (two) times daily.   atorvastatin  20 MG tablet Commonly known as: LIPITOR Take 1 tablet (20 mg total) by mouth daily.   dapagliflozin  propanediol 10 MG Tabs tablet Commonly known as:  FARXIGA  Take 1 tablet (10 mg total) by mouth daily.   furosemide  20 MG tablet Commonly known as: LASIX  Take 1 tablet (20 mg total) by mouth  daily. Start taking on: Nov 27, 2023 What changed: additional instructions   Misc. Devices Misc Please supply patient with tracheostomy humidifier  Z93.0   pantoprazole  40 MG tablet Commonly known as: PROTONIX  Take 1 tablet (40 mg total) by mouth at bedtime.   polyethylene glycol 17 g packet Commonly known as: MIRALAX  / GLYCOLAX  Take 17 g by mouth daily as needed for moderate constipation.   PRESCRIPTION MEDICATION 1 each by Other route at bedtime. CPAP- At bedtime   spironolactone  25 MG tablet Commonly known as: ALDACTONE  Take 1 tablet (25 mg total) by mouth daily.               Durable Medical Equipment  (From admission, onward)           Start     Ordered   11/08/23 1537  For home use only DME Ventilator  Once       Question:  Length of Need  Answer:  Lifetime   11/08/23 1539           Disposition: Home Diet recommendation: Cardiac diet  Discharge Exam: Vitals:   11/26/23 0759 11/26/23 0800 11/26/23 1153 11/26/23 1157  BP:  122/81    Pulse: (!) 52 (!) 50 66   Resp: 13 16 18    Temp:  98.4 F (36.9 C)  98.7 F (37.1 C)  TempSrc:  Oral  Oral  SpO2: 95% 96% 93%   Weight:      Height:       General: Appear in no distress; no visible Abnormal Neck Mass Or lumps, Conjunctiva normal Cardiovascular: S1 and S2 Present, no Murmur, Respiratory: good respiratory effort, Bilateral Air entry present and CTA, no Crackles, no wheezes Abdomen: Bowel Sound present, Non tender  Extremities: no Pedal edema Neurology: alert and oriented to time, place, and person  Filed Weights   11/23/23 0500 11/24/23 0500 11/25/23 0409  Weight: (!) 168.1 kg (!) 168.6 kg (!) 167.5 kg   Condition at discharge: stable  The results of significant diagnostics from this hospitalization (including imaging, microbiology, ancillary and laboratory) are listed below for reference.   Imaging Studies: No results found.  Microbiology: Results for orders placed or performed during  the hospital encounter of 10/22/23  MRSA Next Gen by PCR, Nasal     Status: None   Collection Time: 10/23/23  4:41 PM   Specimen: Nasal Mucosa; Nasal Swab  Result Value Ref Range Status   MRSA by PCR Next Gen NOT DETECTED NOT DETECTED Final    Comment: (NOTE) The GeneXpert MRSA Assay (FDA approved for NASAL specimens only), is one component of a comprehensive MRSA colonization surveillance program. It is not intended to diagnose MRSA infection nor to guide or monitor treatment for MRSA infections. Test performance is not FDA approved in patients less than 68 years old. Performed at [facility], [address] [place], [place] [address]    Labs: CBC: No results for input(s): "WBC", "NEUTROABS", "HGB", "HCT", "MCV", "PLT" in the last 168 hours. Basic Metabolic Panel: Recent Labs  Lab 11/23/23 0612  NA 138  K 4.1  CL 104  CO2 29  GLUCOSE 88  BUN 10  CREATININE 0.96  CALCIUM  8.8*   Liver Function Tests: No results for input(s): "AST", "ALT", "ALKPHOS", "BILITOT", "PROT", "ALBUMIN" in the last 168 hours. CBG:  No results for input(s): "GLUCAP" in the last 168 hours.  Discharge time spent: greater than 30 minutes.  Author: Charlean Congress, MD  [facility] 11/26/2023

## 2023-11-26 NOTE — TOC CM/SW Note (Addendum)
.   Got a call from Zach with [facility] . He said teaching was done yesterday and patient did good. Home vent was left at bedside . She was suppose to use it overnight. As long as she did and everthing went well. She is good to go . RT will meet them at the house. But he has a form for MD to sign. He will email it to [facility] , for MD  signature   Secure chatted team.   Attending confirmed patient used home vent overnight and tolerated.   CMN form signed by Dr Lydia Sams and emailed back to [facility] with [facility]     Zach with [facility] is asking if we know what time she will be leaving the hospital so he can have a RT meet her at the house   Per nurse : family should be getting her around 2/230pm so she said to arrange for RT around 3/330p   Zach aware

## 2023-11-27 ENCOUNTER — Telehealth: Payer: Self-pay | Admitting: *Deleted

## 2023-11-27 NOTE — Transitions of Care (Post Inpatient/ED Visit) (Signed)
 11/27/2023  Name: Angel Brewer MRN: [id number] DOB: 1966-05-21  Today's TOC FU Call Status: Today's TOC FU Call Status:: Successful TOC FU Call Completed TOC FU Call Complete Date: 11/27/23 Patient's Name and Date of Birth confirmed.  Transition Care Management Follow-up Telephone Call Date of Discharge: 11/26/23 Discharge Facility: [name]) Type of Discharge: Inpatient Admission Primary Inpatient Discharge Diagnosis:: Multiple fracture How have you been since you were released from the hospital?: Brewer Any questions or concerns?: No  Items Reviewed: Did you receive and understand the discharge instructions provided?: Yes Medications obtained,verified, and reconciled?: Yes (Medications Reviewed) Any new allergies since your discharge?: No Dietary orders reviewed?: Yes Type of Diet Ordered:: Heart Healthy Do you have support at home?: Yes People in Home [RPT]: alone Name of Support/Comfort Primary Source: Sister-name not provided  Medications Reviewed Today: Medications Reviewed Today     Reviewed by Aura [place], RN (Registered Nurse) on 11/27/23 at 1210  Med List Status: <None>   Medication Order Taking? Sig Documenting Provider Last Dose Status Informant  amLODipine  (NORVASC ) 5 MG tablet [id number] Yes Take 1 tablet (5 mg total) by mouth daily. [name], NP Taking Active Pharmacy Records, Self  apixaban  (ELIQUIS ) 5 MG TABS tablet [id number] Yes Take 1 tablet (5 mg total) by mouth 2 (two) times daily. [name], NP Taking Active Pharmacy Records, Self  atorvastatin  (LIPITOR) 20 MG tablet [id number] Yes Take 1 tablet (20 mg total) by mouth daily. [name], NP Taking Active Pharmacy Records, Self  dapagliflozin  propanediol (FARXIGA ) 10 MG TABS tablet [id number] Yes Take 1 tablet (10 mg total) by mouth daily. [name], NP Taking Active Pharmacy Records, Self  furosemide  (LASIX ) 20 MG tablet [id number] Yes Take 1 tablet (20 mg total)  by mouth daily. Kraig Peru, MD Taking Active   Misc. Devices MISC [id number] Yes Please supply patient with tracheostomy humidifier  Z93.0 [name], NP Taking Active Pharmacy Records, Self  pantoprazole  (PROTONIX ) 40 MG tablet [id number] No Take 1 tablet (40 mg total) by mouth at bedtime.  Patient not taking: Reported on 11/27/2023   Kraig Peru, MD Not Taking Active            Med Note (Kandi Brusseau A   Fri Nov 27, 2023 12:10 PM) Needs to pick up from pharmacy  polyethylene glycol (MIRALAX  / GLYCOLAX ) 17 g packet [id number] No Take 17 g by mouth daily as needed for moderate constipation.  Patient not taking: Reported on 11/27/2023   Kraig Peru, MD Not Taking Active   PRESCRIPTION MEDICATION [id number] No 1 each by Other route at bedtime. CPAP- At bedtime  Patient not taking: Reported on 11/27/2023   [provider] Not Taking Active Pharmacy Records, Self  spironolactone  (ALDACTONE ) 25 MG tablet [id number] Yes Take 1 tablet (25 mg total) by mouth daily. [name], NP Taking Active Pharmacy Records, Self            Home Care and Equipment/Supplies: Were Home Health Services Ordered?: No Any new equipment or medical supplies ordered?: Yes Name of Medical supply agency?: [facility] Vent[facility]ator Were you able to get the equipment/medical supplies?: Yes Do you have any questions related to the use of the equipment/supplies?: No  Functional Questionnaire: Do you need assistance with bathing/showering or dressing?: No Do you need assistance with meal preparation?: No Do you need assistance with eating?: No Do you have difficulty maintaining continence: No Do you need assistance with  getting out of bed/getting out of a chair/moving?: No Do you have difficulty managing or taking your medications?: No  Follow up appointments reviewed: PCP Follow-up appointment confirmed?: Yes Date of PCP follow-up appointment?: 12/14/23 Follow-up Provider: Marlyse Single Follow-up appointment confirmed?: Yes Date of Specialist follow-up appointment?: 01/28/24 (first available) Follow-Up Specialty Provider:: Dr. Milissa Alley Do you need transportation to your follow-up appointment?: No Do you understand care options if your condition(s) worsen?: Yes-patient verbalized understanding  SDOH Interventions Today    Flowsheet Row Most Recent Value  SDOH Interventions   Food Insecurity Interventions Intervention Not Indicated  Housing Interventions Intervention Not Indicated  Transportation Interventions Intervention Not Indicated  Utilities Interventions Intervention Not Indicated       Angel Better RN, BSN Angel Brewer  [facility] RN Care Manager [phone]

## 2023-12-14 ENCOUNTER — Inpatient Hospital Stay: Admitting: Nurse Practitioner

## 2023-12-17 ENCOUNTER — Other Ambulatory Visit: Payer: Self-pay | Admitting: Nurse Practitioner

## 2023-12-17 DIAGNOSIS — E7800 Pure hypercholesterolemia, unspecified: Secondary | ICD-10-CM

## 2023-12-18 ENCOUNTER — Other Ambulatory Visit: Payer: Self-pay | Admitting: Nurse Practitioner

## 2023-12-18 DIAGNOSIS — I5032 Chronic diastolic (congestive) heart failure: Secondary | ICD-10-CM

## 2023-12-18 DIAGNOSIS — I10 Essential (primary) hypertension: Secondary | ICD-10-CM

## 2023-12-25 ENCOUNTER — Other Ambulatory Visit: Payer: Self-pay | Admitting: Nurse Practitioner

## 2023-12-25 DIAGNOSIS — I5032 Chronic diastolic (congestive) heart failure: Secondary | ICD-10-CM

## 2023-12-25 NOTE — Telephone Encounter (Signed)
 Copied from CRM [id number]. Topic: Clinical - Medication Refill >> Dec 25, 2023  2:24 PM Everette C wrote: Medication: dapagliflozin  propanediol (FARXIGA ) 10 MG TABS tablet  Has the patient contacted their pharmacy? Yes (Agent: If no, request that the patient contact the pharmacy for the refill. If patient does not wish to contact the pharmacy document the reason why and proceed with request.) (Agent: If yes, when and what did the pharmacy advise?)  This is the patient's preferred pharmacy:  [facility] [phone] [facility] [place] - [address] [place] [address] Phone: [phone] Fax: [phone]   Is this the correct pharmacy for this prescription? Yes If no, delete pharmacy and type the correct one.   Has the prescription been filled recently? Yes  Is the patient out of the medication? Yes  Has the patient been seen for an appointment in the last year OR does the patient have an upcoming appointment? Yes  Can we respond through MyChart? No  Agent: Please be advised that Rx refills may take up to 3 business days. We ask that you follow-up with your pharmacy.

## 2023-12-28 MED ORDER — DAPAGLIFLOZIN PROPANEDIOL 10 MG PO TABS: 10.0000 mg | ORAL_TABLET | Freq: Every day | ORAL | 0 refills | Status: DC

## 2023-12-28 NOTE — Telephone Encounter (Signed)
 Requested Prescriptions  Pending Prescriptions Disp Refills   dapagliflozin  propanediol (FARXIGA ) 10 MG TABS tablet 90 tablet 0    Sig: Take 1 tablet (10 mg total) by mouth daily.     Endocrinology:  Diabetes - SGLT2 Inhibitors Failed - 12/28/2023  4:15 PM      Failed - Valid encounter within last 6 months    Recent Outpatient Visits           6 months ago Essential hypertension   [facility] [place]. [facility] [name], NP   1 year ago Essential hypertension   [facility] [place]. [facility] [name], NP   1 year ago Hospital discharge follow-up   [facility] [place]. [facility] [name], NP   2 years ago Recurrent pulmonary emboli [facility])   [facility] [place]. [facility] [name], NP   3 years ago Hospital discharge follow-up   [facility] [place] [facility] Of [place]. [facility] [place], [place] W, NP              Passed - Cr in normal range and within 360 days    Creatinine  Date Value Ref Range Status  11/14/2019 0.81 0.44 - 1.00 mg/dL Final   Creat  Date Value Ref Range Status  09/09/2016 0.96 0.50 - 1.05 mg/dL Final    Comment:      For patients > or = 58 years of age: The upper reference limit for Creatinine is approximately 13% higher for people identified as African-American.      Creatinine, Ser  Date Value Ref Range Status  11/23/2023 0.96 0.44 - 1.00 mg/dL Final   Creatinine, POC  Date Value Ref Range Status  02/16/2017 300 mg/dL Final   Creatinine, Urine  Date Value Ref Range Status  11/23/2019 169.75 mg/dL Final    Comment:    Performed at [facility], [address], [place], [place] [address]         Passed - HBA1C is between 0 and 7.9 and within 180 days    Hgb A1c MFr Bld  Date Value Ref Range Status   10/25/2023 5.1 4.8 - 5.6 % Final    Comment:    (NOTE) Pre diabetes:          5.7%-6.4%  Diabetes:              >6.4%  Glycemic control for   <7.0% adults with diabetes          Passed - eGFR in normal range and within 360 days    GFR, Est African American  Date Value Ref Range Status  09/09/2016 79 >=60 mL/min Final   GFR, Est AFR Am  Date Value Ref Range Status  11/14/2019 >60 >60 mL/min Final   GFR calc Af Amer  Date Value Ref Range Status  09/07/2020 68 >59 mL/min/1.73 Final    Comment:    **In accordance with recommendations from the NKF-ASN Task force,**   [facility] is in the process of updating its eGFR calculation to the   2021 CKD-EPI creatinine equation that estimates kidney function   without a race variable.    GFR, Est Non African American  Date Value Ref Range Status  09/09/2016  69 >=60 mL/min Final   GFR, Estimated  Date Value Ref Range Status  11/23/2023 >60 >60 mL/min Final    Comment:    (NOTE) Calculated using the CKD-EPI Creatinine Equation (2021)   11/14/2019 >60 >60 mL/min Final   eGFR  Date Value Ref Range Status  06/17/2023 72 >59 mL/min/1.73 Final

## 2023-12-30 ENCOUNTER — Telehealth: Payer: Self-pay | Admitting: Pulmonary Disease

## 2023-12-30 NOTE — Telephone Encounter (Signed)
 Let Dr. Amy Ball last seen almost 2 years ago.

## 2023-12-30 NOTE — Telephone Encounter (Signed)
 Rc'd fax for [place] [employer] Request for Prior Approval. Will fwd to Dr. Marygrace Snellen for signature.

## 2023-12-31 ENCOUNTER — Ambulatory Visit (HOSPITAL_COMMUNITY)
Admission: RE | Admit: 2023-12-31 | Discharge: 2023-12-31 | Disposition: A | Discharge status: Home / Self Care | Source: Ambulatory Visit | Attending: Acute Care | Admitting: Acute Care

## 2023-12-31 DIAGNOSIS — G4733 Obstructive sleep apnea (adult) (pediatric): Secondary | ICD-10-CM | POA: Diagnosis present

## 2023-12-31 DIAGNOSIS — Z930 Tracheostomy status: Secondary | ICD-10-CM | POA: Diagnosis present

## 2023-12-31 DIAGNOSIS — J9611 Chronic respiratory failure with hypoxia: Secondary | ICD-10-CM | POA: Diagnosis present

## 2023-12-31 DIAGNOSIS — Z6841 Body Mass Index (BMI) 40.0 and over, adult: Secondary | ICD-10-CM

## 2023-12-31 DIAGNOSIS — E662 Morbid (severe) obesity with alveolar hypoventilation: Secondary | ICD-10-CM | POA: Diagnosis present

## 2023-12-31 DIAGNOSIS — E66813 Obesity, class 3: Secondary | ICD-10-CM

## 2023-12-31 DIAGNOSIS — Z8679 Personal history of other diseases of the circulatory system: Secondary | ICD-10-CM

## 2023-12-31 NOTE — Progress Notes (Incomplete)
 Reason for visit.  Trach care   HPI 58 yof known by our service. Last seen in hospital by our service back in June 2023. Her trach was placed May 2023. She was last seen in pulm office 7/31. Since her trach she has not required nocturnal ventilation up until her recent hospitalization, when She came in with acute decompensated heart failure from her OHS ultimately requiring prolonged ventilation and after optimization of volume status was determined she still would benefit from daytime nocturnal ventilation.  She was just discharged On 5/15, she now is has a home ventilator, and a cuffed tracheostomy, she is coming today for posthospital follow-up and tracheostomy management. Review of Systems  Constitutional:  Negative for fever and weight loss.  HENT:         She has had increased pain at her tracheostomy site.  Feels like she has had this present since the last tracheostomy change.  States this does not feel like my other trachs.  Has not had fever.  Does have some discharge from the tracheostomy stoma  Eyes: Negative.   Respiratory:         Slowly improving.  She is having some trouble with a nocturnal ventilator.  Primarily in regards to the tubing itself.  Says she still doing her best  Cardiovascular: Negative.   Gastrointestinal: Negative.   Genitourinary: Negative.   Musculoskeletal: Negative.   Skin: Negative.   Endo/Heme/Allergies: Negative.     Exam   General pleasant 58 year old female patient who is able to Ambulate into the clinic today she is in no acute distress, does have baseline exertional dyspnea HEENT normocephalic atraumatic speaks with PMV, phonation quality is good.  She does have a small ulceration at the bottom of her tracheostomy stoma, this does not appear infected Pulmonary clear to auscultation Cardiac regular rate and rhythm Abdomen soft not tender Extremities warm and dry  Neuro intact   Procedure  The existing tracheostomy was removed, site  inspected, exam noted above.  She had been premedicated with inhaled lidocaine , also provided lidocaine  jelly.  Patient had fairly significant discomfort on tracheostomy removal and replacement.  Her stoma is well matured, she does have the ulceration at the bottom of the stoma is not clear to me as to if it the discomfort is coming from this or perhaps the tracheostomy itself.  Placement was verified via end-tidal CO2, after continued lidocaine  nebulized therapy cough subsided, she was able to phonate per her usual quality, and was in no distress ' Impression/plan   Active Problems:   Tracheostomy dependent (HCC)   Class 3 severe obesity with serious comorbidity and body mass index (BMI) of 60.0 to 69.9 in adult   Chronic hypoxic respiratory failure (HCC)   OSA (obstructive sleep apnea)   Obesity hypoventilation syndrome (HCC)   History of CHF (congestive heart failure)   Tracheostomy dependent (HCC) Overview: Size 6 shiley cuffed.  Last changed on 6/19  Discussion Not a candidate for decannulation given chronic respiratory failure with OHS and OSA as well type III pH.  Seems to be doing well at home.  She would like to get back to work however if she were to do that she would lose her Medicaid, there is no way she could get the therapy she needs if this were to happen.  And also I am not sure she really has the stamina at this point having walked with her in the hallway.  In regards to her tracheostomy in general she has  a fair amount of discomfort.  I do think this is from the small ulceration at the bottom of the stoma, not clear to me why she has this.  She has had a size 6 in the past, it was just not cuffed.  Although the length is the same perhaps now additional weight from ventilator tubing may be contributing to this?  Also I guess we could consider whether or not there may be some malpositioning of the tracheostomy tip towards the posterior tracheal wall Plan We will continue routine  tracheostomy care I am going to see her again in 4 weeks, if she gets more uncomfortable she will call me prior to that.  In this next visit I will evaluate tracheostomy with bronchoscope to #1 evaluate positioning of the end of the tracheostomy tube and ensure it is not abutting the posterior tracheal wall and #2 ensure there are no ulcerations within the airway at the end of the tracheostomy tube either from suction or tube irritation. Her current trach # 6 cuffed 10.8 ODx74, I think options are as follows: biovona TTS cuffed. Can try either 7.5 which is 10.5 OD by 80, 8 (I would prefer) 11 OD x 88  or # 6 prox xlt OR finally Blue Select Portex # 7 OD 10.5x70 -ideally would like to do the xlt or 8 biovona but the OD is 11mm and her current OD is 10.8. this could present some difficulty inserting. Would be best to have the 7.5 biovona too in case. The portex 7 may also be reasonable if we have to go down in size as would allow for 7 inner cannula  This may be a bit challenging    Hadley Leu ACNP-BC [facility] Pulmonary/Critical Care Pager # [phone] OR # [phone] if no answer       My time 32 min  Hadley Leu ACNP-BC [facility] Pulmonary/Critical Care Pager # [phone] OR # [phone] if no answer

## 2023-12-31 NOTE — Progress Notes (Signed)
 Tracheostomy Procedure Note  Angel Brewer [id number] 1966-04-24  Pre Procedure Tracheostomy Information  Trach Brand: Shiley Size: 6.0  [id number] Style: Cuffed Secured by: Velcro   Procedure: Trach Change and Trach Cleaning    Post Procedure Tracheostomy Information  Trach Brand: Shiley Size: 6.0  [id number] Style: Cuffed Secured by: Velcro   Post Procedure Evaluation:  ETCO2 positive color change from yellow to purple : Yes.   Vital signs:VSS Patients current condition: stable Complications: No apparent complications Trach site exam: clear drainage Wound care done: Other: Drawtex wound dressing  and mepilex bandage  Patient did tolerate procedure well.   Education: New style drain gauze  Prescription needs: none    Additional needs: New PMV given at this visit, 1 box of drawtex wound dressing, 1 box of 6IC75 inner cannulas

## 2024-01-06 NOTE — Telephone Encounter (Signed)
 This is addressed to Jeralyn Banner with trach clinic, faxed to [phone]

## 2024-01-18 ENCOUNTER — Telehealth: Payer: Self-pay | Admitting: Nurse Practitioner

## 2024-01-18 NOTE — Telephone Encounter (Signed)
 Called pt to confirm appt. Pt will be present.

## 2024-01-19 ENCOUNTER — Ambulatory Visit: Payer: MEDICAID | Attending: Nurse Practitioner | Admitting: Nurse Practitioner

## 2024-01-19 VITALS — BP 107/72 | HR 103 | Resp 19 | Ht 66.0 in | Wt 390.0 lb

## 2024-01-19 DIAGNOSIS — Z7901 Long term (current) use of anticoagulants: Secondary | ICD-10-CM | POA: Insufficient documentation

## 2024-01-19 DIAGNOSIS — J9610 Chronic respiratory failure, unspecified whether with hypoxia or hypercapnia: Secondary | ICD-10-CM | POA: Diagnosis not present

## 2024-01-19 DIAGNOSIS — Z930 Tracheostomy status: Secondary | ICD-10-CM | POA: Insufficient documentation

## 2024-01-19 DIAGNOSIS — I110 Hypertensive heart disease with heart failure: Secondary | ICD-10-CM | POA: Diagnosis present

## 2024-01-19 DIAGNOSIS — E7800 Pure hypercholesterolemia, unspecified: Secondary | ICD-10-CM | POA: Diagnosis not present

## 2024-01-19 DIAGNOSIS — Z9981 Dependence on supplemental oxygen: Secondary | ICD-10-CM | POA: Diagnosis not present

## 2024-01-19 DIAGNOSIS — Z1231 Encounter for screening mammogram for malignant neoplasm of breast: Secondary | ICD-10-CM | POA: Diagnosis not present

## 2024-01-19 DIAGNOSIS — Z86711 Personal history of pulmonary embolism: Secondary | ICD-10-CM | POA: Insufficient documentation

## 2024-01-19 DIAGNOSIS — G4733 Obstructive sleep apnea (adult) (pediatric): Secondary | ICD-10-CM | POA: Diagnosis not present

## 2024-01-19 DIAGNOSIS — Z86718 Personal history of other venous thrombosis and embolism: Secondary | ICD-10-CM | POA: Insufficient documentation

## 2024-01-19 DIAGNOSIS — Z8249 Family history of ischemic heart disease and other diseases of the circulatory system: Secondary | ICD-10-CM | POA: Diagnosis not present

## 2024-01-19 DIAGNOSIS — I10 Essential (primary) hypertension: Secondary | ICD-10-CM

## 2024-01-19 DIAGNOSIS — Z79899 Other long term (current) drug therapy: Secondary | ICD-10-CM | POA: Insufficient documentation

## 2024-01-19 DIAGNOSIS — Z6841 Body Mass Index (BMI) 40.0 and over, adult: Secondary | ICD-10-CM | POA: Insufficient documentation

## 2024-01-19 DIAGNOSIS — I5032 Chronic diastolic (congestive) heart failure: Secondary | ICD-10-CM | POA: Insufficient documentation

## 2024-01-19 MED ORDER — FUROSEMIDE 20 MG PO TABS: 20.0000 mg | ORAL_TABLET | Freq: Every day | ORAL | 1 refills | Status: DC

## 2024-01-19 MED ORDER — ATORVASTATIN CALCIUM 20 MG PO TABS
20.0000 mg | ORAL_TABLET | Freq: Every day | ORAL | 1 refills | Status: AC
Start: 2024-01-19 — End: ?

## 2024-01-19 MED ORDER — DAPAGLIFLOZIN PROPANEDIOL 10 MG PO TABS
10.0000 mg | ORAL_TABLET | Freq: Every day | ORAL | 1 refills | Status: AC
Start: 2024-01-19 — End: ?

## 2024-01-19 MED ORDER — SPIRONOLACTONE 25 MG PO TABS
25.0000 mg | ORAL_TABLET | Freq: Every day | ORAL | 1 refills | Status: AC
Start: 2024-01-19 — End: ?

## 2024-01-19 MED ORDER — AMLODIPINE BESYLATE 5 MG PO TABS
5.0000 mg | ORAL_TABLET | Freq: Every day | ORAL | 1 refills | Status: AC
Start: 2024-01-19 — End: ?

## 2024-01-19 NOTE — Progress Notes (Signed)
 Assessment and Plan  HTN BP at goal Continue all antihypertensives as prescribed.  Reminded to bring in blood pressure log for follow  up appointment.  RECOMMENDATIONS: DASH/Mediterranean Diets are healthier choices for HTN.     Respiratory support with CPAP/BiPAP Reports discomfort and dryness affecting sleep quality.  Medication management Emphasized importance of farxiga  adherence for renal and cardiac health. - Resend prescription for Farxiga  to pharmacy.  General Health Maintenance Discussed shingles vaccine benefits in symptom reduction. - Order mammogram. She declines shingles vaccine today  Follow-up Needs to reschedule pulmonology appointment due to transportation issues. - Provided contact number for rescheduling pulmonology appointment.   Patient has been counseled on age-appropriate routine health concerns for screening and prevention. These are reviewed and up-to-date. Referrals have been placed accordingly. Immunizations are up-to-date or declined.    Subjective:   Chief Complaint  Patient presents with   Hypertension   History of Present Illness Angel Brewer is a 58 year old female who presents for follow-up to HTN  Patient has been counseled on age-appropriate routine health concerns for screening and prevention. These are reviewed and up-to-date. Referrals have been placed accordingly. Immunizations are up-to-date or declined.     MAMMOGRAM: OVERDUE. Ordered today COLONOSCOPY: Overdue. Referral placed PAP SMEAR: OVERDUE. Referral placed.    She has a history of acute on chronic respiratory failure (O2 3L at night) , tracheostomy, severe OSA refractory to NIPPV (followed by Pulmonology), Morbid Obesity, HTN, HPL, PE/DVT (chronic anticoagulation), chronic HF (needs follow up with cardiology)  She experienced a fall a few months ago, resulting in a month-long hospitalization. Since then, she has been recovering but did not undergo outpatient  rehabilitation. She faces challenges with transportation and driving due to leg issues getting in the car.   She is currently on disability and has encountered difficulties with Medicaid coverage. Initially, her Medicaid was discontinued due to receiving disability benefits. However, after her hospital stay, she learned about Medicaid for disability and is in the process of reinstating it. This has affected her ability to afford medications and transportation.  She uses a machine at night to remove carbon dioxide from her oxygen , similar to a CPAP or BiPAP. She experiences difficulty sleeping with the machine due to discomfort and dryness, even with a humidifier. The dryness increases her phlegm production.  She is prescribed Farxiga , but there was an issue with the pharmacy not notifying her about the prescription sent in June. Consequently, the prescription was not filled, and she is working to resolve this with the pharmacy.  HTN Blood pressure is well-controlled.  She is taking amlodipine  5 mg daily and spironolactone  25 mg daily as prescribed.  She takes Lasix  20 mg daily. BP Readings from Last 3 Encounters:  01/19/24 107/72  11/26/23 122/81  09/11/23 126/60      Review of Systems  Constitutional:  Negative for fever, malaise/fatigue and weight loss.  HENT: Negative.  Negative for nosebleeds.   Eyes: Negative.  Negative for blurred vision, double vision and photophobia.  Respiratory: Negative.  Negative for cough and shortness of breath.   Cardiovascular: Negative.  Negative for chest pain, palpitations and leg swelling.  Gastrointestinal: Negative.  Negative for heartburn, nausea and vomiting.  Musculoskeletal: Negative.  Negative for myalgias.  Neurological: Negative.  Negative for dizziness, focal weakness, seizures and headaches.  Psychiatric/Behavioral: Negative.  Negative for suicidal ideas.     Past Medical History:  Diagnosis Date   Chronic diastolic CHF (congestive heart  failure) (HCC) 01/05/2022  DVT (deep vein thrombosis) in pregnancy    RLE DVT 02/2016   Dyspnea    Essential hypertension 08/25/2017   Hyperlipidemia 11/21/2021   Morbid obesity (HCC)    Obesity hypoventilation syndrome (HCC) 05/03/2021   PE (pulmonary embolism) 02/29/2016   Sleep apnea    uses a cpap   Tracheostomy present (HCC) 01/05/2022   Size 6 shiley/cuffless  Last changed: 10/9     Discussion  Stable trach.  She does get a Lac more short of breath with exertion.  Although I did not observe it on my exam she does endorse she will occasionally have a rush of air after removing PMV following activity.  This could suggest some mild air trapping from the trach.  Otherwise she is doing fantastic with the trach     Plan  Cont routin    Past Surgical History:  Procedure Laterality Date   CESAREAN SECTION     x 3   OPEN REDUCTION INTERNAL FIXATION (ORIF) DISTAL RADIAL FRACTURE Right 06/20/2019   Procedure: OPEN REDUCTION INTERNAL FIXATION (ORIF) DISTAL RADIAL FRACTURE;  Surgeon: Sebastian Lenis, MD;  Location: [facility] OR;  Service: Orthopedics;  Laterality: Right;  REGIONAL BLOCK TOTAL SURGERY TIME: 90 MINUTES   SYNOVECTOMY Right 11/21/2019   Procedure: RIGHT WRIST LIGAMENT RECONSTRUCTION;  Surgeon: Sebastian Lenis, MD;  Location: [facility] OR;  Service: Orthopedics;  Laterality: Right;  PROCEDURE: RIGHT WRIST LIGAMENT RECONSTRUCTION LENGTH OF SURGERY: 105 MIN    Family History  Problem Relation Age of Onset   Hypertension Sister     Social History Reviewed with no changes to be made today.   Outpatient Medications Prior to Visit  Medication Sig Dispense Refill   apixaban  (ELIQUIS ) 5 MG TABS tablet Take 1 tablet (5 mg total) by mouth 2 (two) times daily. 180 tablet 3   Misc. Devices MISC Please supply patient with tracheostomy humidifier  Z93.0 1 each 0   pantoprazole  (PROTONIX ) 40 MG tablet Take 1 tablet (40 mg total) by mouth at bedtime. 30 tablet 0   polyethylene glycol (MIRALAX  /  GLYCOLAX ) 17 g packet Take 17 g by mouth daily as needed for moderate constipation. 14 each 0   PRESCRIPTION MEDICATION 1 each by Other route at bedtime. CPAP- At bedtime     amLODipine  (NORVASC ) 5 MG tablet TAKE 1 TABLET (5 MG TOTAL) BY MOUTH DAILY. 90 tablet 0   atorvastatin  (LIPITOR) 20 MG tablet TAKE 1 TABLET BY MOUTH EVERY DAY 90 tablet 0   furosemide  (LASIX ) 20 MG tablet TAKE 2 TABLETS(40MG ) BY MOUTH DAILY FOR THE NEXT 5 DAYS THEN RESUME 1 TABLET(20MG ) BY MOUTH DAILY. 90 tablet 0   spironolactone  (ALDACTONE ) 25 MG tablet TAKE 1 TABLET (25 MG TOTAL) BY MOUTH DAILY. 90 tablet 0   dapagliflozin  propanediol (FARXIGA ) 10 MG TABS tablet Take 1 tablet (10 mg total) by mouth daily. (Patient not taking: Reported on 01/19/2024) 90 tablet 0   No facility-administered medications prior to visit.    No Known Allergies     Objective:    BP 107/72 (BP Location: Left Arm, Patient Position: Sitting, Cuff Size: Normal)   Pulse (!) 103   Resp 19   Ht 5' 6 (1.676 m)   Wt (!) 390 lb (176.9 kg)   LMP 11/29/2015   SpO2 100%   BMI 62.95 kg/m  Wt Readings from Last 3 Encounters:  01/19/24 (!) 390 lb (176.9 kg)  11/25/23 (!) 369 lb 4.3 oz (167.5 kg)  09/11/23 (!) 393 lb (178.3 kg)  Physical Exam Vitals and nursing note reviewed.  Constitutional:      Appearance: She is well-developed.  HENT:     Head: Normocephalic and atraumatic.  Cardiovascular:     Rate and Rhythm: Regular rhythm. Tachycardia present.     Heart sounds: Normal heart sounds. No murmur heard.    No friction rub. No gallop.  Pulmonary:     Effort: Pulmonary effort is normal. No tachypnea or respiratory distress.     Breath sounds: Normal breath sounds. No decreased breath sounds, wheezing, rhonchi or rales.  Chest:     Chest wall: No tenderness.  Abdominal:     General: Bowel sounds are normal.     Palpations: Abdomen is soft.  Musculoskeletal:        General: Normal range of motion.     Cervical back: Normal range of  motion.  Skin:    General: Skin is warm and dry.  Neurological:     Mental Status: She is alert and oriented to person, place, and time.     Coordination: Coordination normal.  Psychiatric:        Behavior: Behavior normal. Behavior is cooperative.        Thought Content: Thought content normal.        Judgment: Judgment normal.          Patient has been counseled extensively about nutrition and exercise as well as the importance of adherence with medications and regular follow-up. The patient was given clear instructions to go to ER or return to medical center if symptoms don't improve, worsen or new problems develop. The patient verbalized understanding.   Follow-up: Return in about 6 months (around 07/21/2024).   Haze LELON Servant, FNP-BC [facility] [place], [place] [phone]   01/19/2024, 4:16 PM

## 2024-01-19 NOTE — Patient Instructions (Addendum)
 VISIT SUMMARY:  TRACH APPT  Angel Epley, MD Address: [address] [place], [place] [address] Phone: [phone]  Today, we discussed your recovery after your fall, issues with transportation and medication access, and your current health management. We reviewed your use of the CPAP/BiPAP machine, medication adherence, and general health maintenance, including vaccinations and screenings.  YOUR PLAN:  -RESPIRATORY SUPPORT WITH CPAP/BIPAP: You mentioned discomfort and dryness when using your CPAP/BiPAP machine, which affects your sleep quality. This machine helps remove carbon dioxide from your oxygen . We will continue to monitor this and pulmonology may need to make adjustments as needed to improve your comfort.  -MEDICATION MANAGEMENT: We discussed the importance of taking Farxiga  regularly for your kidney and heart health. We will resend the prescription to the pharmacy to ensure you receive it.  -GENERAL HEALTH MAINTENANCE: We talked about the benefits of the shingles vaccine in reducing symptoms and ordered a mammogram for you. These steps are important for your overall health.  INSTRUCTIONS:  Please reschedule your trach appointment on the 17th as soon as possible. We have provided you with the contact number to do so.

## 2024-01-20 ENCOUNTER — Telehealth: Payer: Self-pay

## 2024-01-20 ENCOUNTER — Other Ambulatory Visit: Payer: Self-pay

## 2024-01-20 NOTE — Telephone Encounter (Signed)
 Pharmacy Patient Advocate Encounter   Received notification from CoverMyMeds that prior authorization for FARXIGA  is required/requested.   Insurance verification completed.   The patient is insured through [facility] MEDICAID .   Per test claim: PA required; PA submitted to above mentioned insurance via [facility] Tracks Key/confirmation #/EOC [id number] W Status is pending

## 2024-01-21 ENCOUNTER — Telehealth (INDEPENDENT_AMBULATORY_CARE_PROVIDER_SITE_OTHER): Payer: Self-pay

## 2024-01-21 NOTE — Telephone Encounter (Signed)
 Contacted pt to schedule mammogram(for mm bus) pt didn't answer and was unable to lvm

## 2024-01-28 ENCOUNTER — Ambulatory Visit (HOSPITAL_COMMUNITY)
Admission: RE | Admit: 2024-01-28 | Discharge: 2024-01-28 | Disposition: A | Discharge status: Home / Self Care | Payer: MEDICAID | Source: Ambulatory Visit | Attending: Acute Care | Admitting: Acute Care

## 2024-01-28 ENCOUNTER — Ambulatory Visit: Admitting: Pulmonary Disease

## 2024-01-28 DIAGNOSIS — J449 Chronic obstructive pulmonary disease, unspecified: Secondary | ICD-10-CM | POA: Insufficient documentation

## 2024-01-28 DIAGNOSIS — Z930 Tracheostomy status: Secondary | ICD-10-CM | POA: Diagnosis not present

## 2024-01-28 DIAGNOSIS — Z6841 Body Mass Index (BMI) 40.0 and over, adult: Secondary | ICD-10-CM | POA: Insufficient documentation

## 2024-01-28 DIAGNOSIS — Z430 Encounter for attention to tracheostomy: Secondary | ICD-10-CM | POA: Diagnosis present

## 2024-01-28 DIAGNOSIS — E662 Morbid (severe) obesity with alveolar hypoventilation: Secondary | ICD-10-CM | POA: Diagnosis present

## 2024-01-28 DIAGNOSIS — J9611 Chronic respiratory failure with hypoxia: Secondary | ICD-10-CM | POA: Insufficient documentation

## 2024-01-28 DIAGNOSIS — E66813 Obesity, class 3: Secondary | ICD-10-CM | POA: Insufficient documentation

## 2024-01-28 DIAGNOSIS — Z9911 Dependence on respirator [ventilator] status: Secondary | ICD-10-CM | POA: Insufficient documentation

## 2024-01-28 DIAGNOSIS — G4733 Obstructive sleep apnea (adult) (pediatric): Secondary | ICD-10-CM | POA: Diagnosis not present

## 2024-01-28 NOTE — Progress Notes (Signed)
 Tracheostomy Procedure Note  Angel Brewer [id number] 1965-10-30  Pre Procedure Tracheostomy Information  Trach Brand: Shiley Size: 6.0 6CN75 Style: Cuffed Secured by: Velcro Lidocaine  neb tx given prior to trach change  Procedure: Trach Change/ Cleaning and Bronchoscopy ( airway observation)    Post Procedure Tracheostomy Information  Trach Brand: Shiley Size: 6.0 6CN75 Style: Cuffed Secured by: Velcro   Post Procedure Evaluation:  ETCO2 positive color change from yellow to purple : Yes.   Vital signs:VSS Patients current condition: stable Complications: No apparent complications Trach site exam: clean, dry Wound care done: 4 x 4 gauze dtain Patient did tolerate procedure well.   Education: none  Prescription needs: none    Additional needs: New PMV given to patient at this visit

## 2024-01-28 NOTE — Progress Notes (Signed)
 Reason for visit.  Trach care   HPI 12 yof known by our service. Last seen in hospital by our service back in June 2023. Her trach was placed May 2023. She was last seen in pulm office 7/31. Since her trach she has not required nocturnal ventilation up until her recent hospitalization, when She came in with acute decompensated heart failure from her OHS ultimately requiring prolonged ventilation and after optimization of volume status was determined she still would benefit from daytime nocturnal ventilation.  She was just discharged On 5/15, she now is has a home ventilator, and a cuffed tracheostomy, I saw her last on 6/18. During that visit she was reporting trach site discomfort, on that last visit I had planned to evaluate the trach via FOB to ensure that the tip of trach was not up against the posterior tracheal wall, inspect for ulcerations in the airway (perhaps sxn trauma). During that visit we also considered trying different trach models   options are as follows: biovona TTS cuffed. Can try either 7.5 which is 10.5 OD by 80, 8 (I would prefer) 11 OD x 88  or # 6 prox xlt OR finally Blue Select Portex # 7 OD 10.5x70 -ideally would like to do the xlt or 8 biovona but the OD is 11mm and her current OD is 10.8. this could present some difficulty inserting. Would be best to have the 7.5 biovona too in case. The portex 7 may also be reasonable if we have to go down in size as would allow for 7 inner cannula   Presents today for planned follow up Review of Systems  Constitutional: Negative.   HENT:         Trach site discomfort improved some   Eyes: Negative.   Respiratory: Negative.    Cardiovascular: Negative.   Gastrointestinal: Negative.   Genitourinary: Negative.   Skin: Negative.     Exam   General this is a 58 year old female well known to me. Presents to clinic in no distress HENT NCAT trach #6 cuffed. PMV in place. Excellent phonation quality. Site does still have trace  granulation tissue at bottom (6 o'clock) of stoma. This appears to be improving Pulm clear Card rrr Abd soft Ext warm  Neuro intact  Procedure  The bronchoscope was passed thru the existing tracheostomy the distal tip of the trach was in center of airway. I had her flex and extend her neck. The airway remained un-obstructed and at no time did it touch the posterior wall of trach. Following this the trach and bronchoscope was removed, site inspected, exam noted above.  She had been premedicated with inhaled lidocaine , also provided lidocaine  jelly.    Her stoma is well matured, she does have the ulceration at the bottom of the stoma which has decreased in size. New trach placed over obrurator. Placement was verified via end-tidal CO2,as well as direct visualization w/ scope.  after continued lidocaine  nebulized therapy cough subsided, she was able to phonate per her usual quality, and was in no distress ' Impression/plan   Active Problems:   Tracheostomy dependent (HCC)   Class 3 severe obesity with serious comorbidity and body mass index (BMI) of 60.0 to 69.9 in adult   Chronic hypoxic respiratory failure (HCC)   OSA (obstructive sleep apnea)   Obesity hypoventilation syndrome (HCC)   COPD (chronic obstructive pulmonary disease) (HCC)   Tracheostomy dependence (HCC)   Tracheostomy dependent (HCC) Overview: Size 6 shiley cuffed.  Last changed on  7/17 prior was changed on 6/19  Discussion Not a candidate for decannulation given chronic respiratory failure with OHS and OSA as well type III pH.  Seems to be doing well at home.   I do think her trach discomfort is from the small ulceration at the bottom of the stoma, not clear to me why she has this but it is improving.  Plan We will continue routine tracheostomy care Cont q 4 week trach change Keep stoma site dry I do not think we would improve her situation w/ trach model change (had wondered about this last visit) on further evaluation  no good option w/out dilation     Maude FORBES Banner ACNP-BC [facility] Pulmonary/Critical Care Pager # [phone] OR # [phone] if no answer        My time31 min  Maude FORBES Banner ACNP-BC [facility] Pulmonary/Critical Care Pager # [phone] OR # [phone] if no answer

## 2024-02-03 ENCOUNTER — Telehealth: Payer: Self-pay | Admitting: Acute Care

## 2024-02-03 ENCOUNTER — Ambulatory Visit
Admission: RE | Admit: 2024-02-03 | Discharge: 2024-02-03 | Disposition: A | Discharge status: Home / Self Care | Source: Ambulatory Visit | Attending: Nurse Practitioner

## 2024-02-03 DIAGNOSIS — Z1231 Encounter for screening mammogram for malignant neoplasm of breast: Secondary | ICD-10-CM

## 2024-02-03 NOTE — Progress Notes (Unsigned)
 Called by patient. Re: concern about DME wanting oxygen  back  Clinical synopses Patient has chronic hypoxic and hypercarbic respiratory failure due to obesity hypoventilation syndrome and sleep apnea, this is complicated by Group 3 and 4  PH, with chronic right ventricular dysfunction, w/ prior pulmonary emboli She has had 2 admissions for acute decompensated diastolic heart failure with pulmonary edema and volume overload as consequence of acute on chronic cor pulmonale exacerbated by hypoxia.  Hospital data to support current nocturnal vent needs 4/13 when pulm was consulted for increased somnolence and hypoxia. At that point abg:  7.17/pco2 104/po2 82 and bicarb 38 on 40% trach collar. She was placed on vent: abg Improved: 7.36 PCO2 61 PO2 60 bicarb 35%   Therapy maximized, this included aggressive diuresis, physical therapy and nocturnal ventilator.  Seen again in consultation 4/27. This was to determine if indeed nocturnal ventilation was required. She had been off nocturnal ventilation x 3 days and abg 7.39/pco2 63/o2 78/bicarb 30 on 28% ATC so we determined she indeed meets criteria for nocturnal ventilation. She was subsequently set up with this and sent home.  Since discharge has been at home. She is using Nocturnal ventilation w/ 3 lpm oxygen  bleed in. She has been slowly improving. I have seen her twice in clinic for routine trach change. She continues to require nocturnal ventilation  This patient requires non-invasive for chronic respiratory failure  Without ventilator, there is significant risk of untimely readmission to the hospital and increase on CO2 retention. BIPAP is ineffective in the home setting in reducing CO2 due to the lack of an auto-rate, and she is also tracheostomy dependent.   I have reached out to adapt DME. Working w/ local branches to correct this. I believe without the supplemental oxygen  bleed into her vent she will be at risk for nocturnal hypoxia, which will once  again result in progressive Cor pulmonale, and eventually decompensated diastolic dysfxn w/ edema and subsequent acute on chronic resp failure.  Would love to avoid another month long admission   Maude FORBES Banner ACNP-BC [facility] Pulmonary/Critical Care Pager # [phone] OR # [phone] if no answer

## 2024-02-16 ENCOUNTER — Ambulatory Visit: Payer: Self-pay | Admitting: Nurse Practitioner

## 2024-03-10 ENCOUNTER — Ambulatory Visit (HOSPITAL_COMMUNITY)
Admission: RE | Admit: 2024-03-10 | Discharge: 2024-03-10 | Disposition: A | Discharge status: Home / Self Care | Source: Ambulatory Visit | Attending: Nurse Practitioner | Admitting: Nurse Practitioner

## 2024-03-10 DIAGNOSIS — Z9911 Dependence on respirator [ventilator] status: Secondary | ICD-10-CM | POA: Diagnosis present

## 2024-03-10 DIAGNOSIS — G4733 Obstructive sleep apnea (adult) (pediatric): Secondary | ICD-10-CM

## 2024-03-10 DIAGNOSIS — Z6841 Body Mass Index (BMI) 40.0 and over, adult: Secondary | ICD-10-CM

## 2024-03-10 DIAGNOSIS — Z930 Tracheostomy status: Secondary | ICD-10-CM | POA: Diagnosis present

## 2024-03-10 DIAGNOSIS — E662 Morbid (severe) obesity with alveolar hypoventilation: Secondary | ICD-10-CM | POA: Diagnosis present

## 2024-03-10 NOTE — Progress Notes (Signed)
 Reason for visit.  Trach care   HPI 45 yof known by our service. Last seen in hospital by our service back in June 2023. Her trach was placed May 2023. She was last seen in pulm office 7/31. Since her trach she has not required nocturnal ventilation up until her recent hospitalization, when She came in with acute decompensated heart failure from her OHS ultimately requiring prolonged ventilation and after optimization of volume status was determined she still would benefit from daytime nocturnal ventilation.  She was just discharged On 5/15, she now is has a home ventilator, and a cuffed tracheostomy, I saw her last on 7/17 during this time we were considering trach model change d/t discomfort but I looked w/ bronchoscope and after evaluating where trach tip is positioned I did not feel that trach model change would improve her symptoms, I felt most of the discomfort was from a small area of granulation located at bottom of trach. She returns today for planned trach change   Review of Systems  Constitutional:  Negative for chills, fever and weight loss.       Has been generally feeling deconditioned.  Now that she is no longer working she does not get out of the house is much.  Also family member currently has her car so activity has been limited to the house primarily  HENT: Negative.         Her tracheostomy cuff blew about a week ago.  She was unable to keep it inflated.  Has not felt like her ventilator has been as effective as usual over this time  Eyes: Negative.   Respiratory:         She has her baseline exertional dyspnea Really feels more deconditioned than short of breath Has not had any increased lower extremity swelling, cough, chest discomfort  Cardiovascular: Negative.   Gastrointestinal: Negative.   Genitourinary: Negative.   Musculoskeletal: Negative.   Skin: Negative.     Exam  General This is a 58 year old female patient well-known to me I follow her for tracheostomy  management presents today for planned tracheostomy change she is in no distress, ambulated to the clinic today from the main entrance HEENT normal cephalic atraumatic mucous membranes are moist.  She has a size 6 cuffed tracheostomy in its midline.  Peristomal area is unremarkable.  She does have a small pin size area of ulceration located about 7:00 in relation to clock Dial noted just past the entrance of her stoma.  She uses PMV without difficulty.  No rush of air appreciated with PMV removal excellent voice quality, strong cough Pulmonary clear diminished bases no accessory use Cardiac regular rate and rhythm Abdomen soft nontender no organomegaly Extremities are at baseline with some dependent edema  neuro intact  Procedure   The patient was premedicated with inhaled lidocaine , following that the tracheostomy was removed, sterile lidocaine  jelly applied locally, and then new cuffed tracheostomy placed over obturator without difficulty placement verified with end-tidal CO2 patient tolerated well Impression/plan   Active Problems:   Tracheostomy dependent (HCC)   Class 3 severe obesity with serious comorbidity and body mass index (BMI) of 60.0 to 69.9 in adult   OSA (obstructive sleep apnea)   Obesity hypoventilation syndrome (HCC)   Ventilator dependence (HCC)  Tracheostomy dependent (HCC) Overview: Size 6 shiley cuffed.  Last changed on  8/28 prior was changed on 7/17  Discussion Not a candidate for decannulation given chronic respiratory failure with OHS and OSA as well type  III pH.  Seems to be doing well at home.   I do think her trach discomfort is from the small ulceration at the bottom of the stoma, not clear to me why she has this but it is improving.  Stable tracheostomy Plan We will continue routine tracheostomy care Cont q 4 week trach change Keep stoma site dry I have provided her with more absorbent tracheostomy dressings, see if that improves tracheostomy site  comfort Also in regards to her tracheostomy cuff malfunction, I have encouraged her to reach out to me should this happen again in the future.  She needs ventilatory and positive pressure support at night, I fear without adequate ventilation things may decline further    Maude FORBES Banner ACNP-BC [facility] Pulmonary/Critical Care Pager # [phone] OR # [phone] if no answer        My time 27 min

## 2024-03-10 NOTE — Progress Notes (Signed)
 Tracheostomy Procedure Note  Angel Brewer [id number] [birth year]  Pre Procedure Tracheostomy Information  Trach Brand: Shiley Size: 6.0 6CN75 Style: Cuffed Secured by: Velcro   Procedure: Trach Change and Trach Cleaning    Post Procedure Tracheostomy Information  Trach Brand: Shiley Size: 6.0 6CN75 Style: Cuffed Secured by: Velcro   Post Procedure Evaluation:  ETCO2 positive color change from yellow to purple : Yes.   Vital signs:VSS Patients current condition: stable Complications: No apparent complications Trach site exam: clean, dry Wound care done: 4 x 4 gauze drain Patient did tolerate procedure well.   Education: none  Prescription needs: none    Additional needs:  New PMV and an additional one given to patient at this visit

## 2024-03-24 ENCOUNTER — Encounter: Payer: Self-pay | Admitting: Pulmonary Disease

## 2024-03-24 ENCOUNTER — Ambulatory Visit (INDEPENDENT_AMBULATORY_CARE_PROVIDER_SITE_OTHER): Admitting: Pulmonary Disease

## 2024-03-24 VITALS — BP 118/77 | HR 81 | Temp 98.0°F | Ht 67.0 in | Wt 399.6 lb

## 2024-03-24 DIAGNOSIS — G4733 Obstructive sleep apnea (adult) (pediatric): Secondary | ICD-10-CM

## 2024-03-24 DIAGNOSIS — Z87891 Personal history of nicotine dependence: Secondary | ICD-10-CM | POA: Diagnosis not present

## 2024-03-24 DIAGNOSIS — R0609 Other forms of dyspnea: Secondary | ICD-10-CM | POA: Diagnosis not present

## 2024-03-24 DIAGNOSIS — Z9911 Dependence on respirator [ventilator] status: Secondary | ICD-10-CM

## 2024-03-24 NOTE — Progress Notes (Signed)
 @Patient  ID: Angel Brewer, female    DOB: 11-25-1965, 58 y.o.   MRN: [id number]  Chief Complaint  Patient presents with   Hospitalization Follow-up    COPD, Respiratory Failure     Referring provider: Tobie Yetta HERO, MD  HPI:   58 y.o. woman whom we are seeing in hospital follow-up following fall and acute on chronic hypercarbic and hypoxic respiratory failure now with cuffed trach and NIPPV at night.  Multiple tracheostomy clinic notes reviewed.  Multiple notes from hospitalization reviewed.  Patient fell walking to bathroom.  Broke several bones.  Likely due to pain had worsening hypercarbia.  Placed on the ventilator.  Tolerated daytime trach collar needing nocturnal ventilator with severe hypercarbia.  Doing well with is now at home.  No issues.  Trying to exercise more walk a bit more.  She feels short of breath.  We discussed trying inhalers.  She declined this.  Notably she was prescribed NIPPV in the past and stopped using it.  Previously maintained with trach collar and nocturnal oxygen  for OSA.  Now using NIPPV more regularly with good effect.   HPI initial visit: Overall, she is doing well.  Prolonged hospitalization for respiratory failure.  Admitted with acute on chronic hypoxemic/hypercarbic respiratory failure.  Felt to be a OSA/OHS chronically.  Treated with antibiotics.  Difficulty ventilating weaning from ventilator.  Underwent tracheostomy.  Weaned off ventilator transferred to the floor.  Was tolerating Passy-Muir valve.  Continues to tolerate Passy-Muir valve well.  Using nocturnal oxygen  at night.  She takes out the Passy-Muir valve and attaches oxygen  at night.  Angel Brewer is uncapped at night.  Compared to before her time in the hospital recently, she gets much better rest.  Much more active during the day.  Not as sleepy.  No headaches.  Feels better overall.  Slowly get her stamina back.  Spent 7 days in the rehabilitation hospital.  Was doing so well she was  discharged.  She continues to work on getting her stamina back, walking etc.    Questionaires / Pulmonary Flowsheets:   ACT:      No data to display          MMRC:     No data to display          Epworth:      No data to display          Tests:   FENO:  No results found for: NITRICOXIDE  PFT:     No data to display          WALK:      No data to display          Imaging: Personally reviewed No results found.  Lab Results: Personally reviewed CBC    Component Value Date/Time   WBC 4.3 11/05/2023 0638   RBC 4.54 11/05/2023 0638   HGB 12.9 11/08/2023 0351   HGB 13.3 06/17/2023 1112   HCT 38.0 11/08/2023 0351   HCT 42.3 06/17/2023 1112   PLT 204 11/05/2023 0638   PLT 239 06/17/2023 1112   MCV 94.7 11/05/2023 0638   MCV 95 06/17/2023 1112   MCH 29.3 11/05/2023 0638   MCHC 30.9 11/05/2023 0638   RDW 14.8 11/05/2023 0638   RDW 12.5 06/17/2023 1112   LYMPHSABS 0.8 10/27/2023 0512   LYMPHSABS 1.3 06/17/2023 1112   MONOABS 0.5 10/27/2023 0512   EOSABS 0.1 10/27/2023 0512   EOSABS 0.2 06/17/2023 1112   BASOSABS 0.0 10/27/2023 0512  BASOSABS 0.1 06/17/2023 1112    BMET    Component Value Date/Time   NA 138 11/23/2023 0612   NA 143 06/17/2023 1112   K 4.1 11/23/2023 0612   CL 104 11/23/2023 0612   CO2 29 11/23/2023 0612   GLUCOSE 88 11/23/2023 0612   BUN 10 11/23/2023 0612   BUN 13 06/17/2023 1112   CREATININE 0.96 11/23/2023 0612   CREATININE 0.81 11/14/2019 1022   CREATININE 0.96 09/09/2016 0911   CALCIUM  8.8 (L) 11/23/2023 0612   GFRNONAA >60 11/23/2023 0612   GFRNONAA >60 11/14/2019 1022   GFRNONAA 69 09/09/2016 0911   GFRAA 68 09/07/2020 0900   GFRAA >60 11/14/2019 1022   GFRAA 79 09/09/2016 0911    BNP    Component Value Date/Time   BNP 9.4 10/27/2023 0512    ProBNP No results found for: PROBNP  Specialty Problems       Pulmonary Problems   Chronic hypoxic respiratory failure (HCC)   Acute hypoxemic  respiratory failure (HCC)   OSA (obstructive sleep apnea)   Obesity hypoventilation syndrome (HCC)   COPD (chronic obstructive pulmonary disease) (HCC)   Tracheostomy dependent (HCC)   Size 6 shiley cuffed.  Last changed on  8/28 prior was changed on 7/17  Discussion Not a candidate for decannulation given chronic respiratory failure with OHS and OSA as well type III pH.  Seems to be doing well at home.   I do think her trach discomfort is from the small ulceration at the bottom of the stoma, not clear to me why she has this but it is improving.  Stable tracheostomy Plan We will continue routine tracheostomy care Cont q 4 week trach change Keep stoma site dry I have provided her with more absorbent tracheostomy dressings, see if that improves tracheostomy site comfort Also in regards to her tracheostomy cuff malfunction, I have encouraged her to reach out to me should this happen again in the future.  She needs ventilatory and positive pressure support at night, I fear without adequate ventilation things may decline further    Angel Brewer Banner ACNP-BC [facility] Pulmonary/Critical Care Pager # [phone] OR # [phone] if no answer       Respiratory failure (HCC)   CAP (community acquired pneumonia)   Acute respiratory failure (HCC)   COPD exacerbation (HCC)   Acute respiratory failure with hypoxia and hypercarbia (HCC)   OSA on CPAP   Tracheostomy dependence (HCC)   Ventilator dependence (HCC)    No Known Allergies  Immunization History  Administered Date(s) Administered   Influenza,inj,Quad PF,6+ Mos 03/19/2016   Tdap 03/19/2016    Past Medical History:  Diagnosis Date   Chronic diastolic CHF (congestive heart failure) (HCC) 01/05/2022   DVT (deep vein thrombosis) in pregnancy    RLE DVT 02/2016   Dyspnea    Essential hypertension 08/25/2017   Hyperlipidemia 11/21/2021   Morbid obesity (HCC)    Obesity hypoventilation syndrome (HCC) 05/03/2021   PE (pulmonary embolism)  02/29/2016   Sleep apnea    uses a cpap   Tracheostomy present (HCC) 01/05/2022   Size 6 shiley/cuffless  Last changed: 10/9     Discussion  Stable trach.  She does get a [place] more short of breath with exertion.  Although I did not observe it on my exam she does endorse she will occasionally have a rush of air after removing PMV following activity.  This could suggest some mild air trapping from the trach.  Otherwise she is doing  fantastic with the trach     Plan  Cont routin    Tobacco History: Social History   Tobacco Use  Smoking Status Former  Smokeless Tobacco Never  Tobacco Comments   smoked only as a teenager   Counseling given: Not Answered Tobacco comments: smoked only as a teenager   Continue to not smoke  Outpatient Encounter Medications as of 03/24/2024  Medication Sig   amLODipine  (NORVASC ) 5 MG tablet Take 1 tablet (5 mg total) by mouth daily.   apixaban  (ELIQUIS ) 5 MG TABS tablet Take 1 tablet (5 mg total) by mouth 2 (two) times daily.   atorvastatin  (LIPITOR) 20 MG tablet Take 1 tablet (20 mg total) by mouth daily.   dapagliflozin  propanediol (FARXIGA ) 10 MG TABS tablet Take 1 tablet (10 mg total) by mouth daily.   furosemide  (LASIX ) 20 MG tablet Take 1 tablet (20 mg total) by mouth daily.   Misc. Devices MISC Please supply patient with tracheostomy humidifier  Z93.0   pantoprazole  (PROTONIX ) 40 MG tablet Take 1 tablet (40 mg total) by mouth at bedtime.   polyethylene glycol (MIRALAX  / GLYCOLAX ) 17 g packet Take 17 g by mouth daily as needed for moderate constipation.   PRESCRIPTION MEDICATION 1 each by Other route at bedtime. CPAP- At bedtime   spironolactone  (ALDACTONE ) 25 MG tablet Take 1 tablet (25 mg total) by mouth daily.   No facility-administered encounter medications on file as of 03/24/2024.     Review of Systems  Review of Systems  N/a Physical Exam  BP 118/77   Pulse 81   Temp 98 F (36.7 C)   Ht 5' 7 (1.702 m)   Wt (!) 399 lb 9.6 oz  (181.3 kg)   LMP 11/29/2015   SpO2 96%   BMI 62.59 kg/m   Wt Readings from Last 5 Encounters:  03/24/24 (!) 399 lb 9.6 oz (181.3 kg)  01/19/24 (!) 390 lb (176.9 kg)  11/25/23 (!) 369 lb 4.3 oz (167.5 kg)  09/11/23 (!) 393 lb (178.3 kg)  06/17/23 (!) 409 lb (185.5 kg)    BMI Readings from Last 5 Encounters:  03/24/24 62.59 kg/m  01/19/24 62.95 kg/m  11/25/23 59.60 kg/m  09/11/23 63.43 kg/m  06/17/23 66.01 kg/m     Physical Exam General: Sitting in chair, no acute distress Eyes: EOMI, no icterus Pulmonary: Good air movement, clear Cardiovascular: Warm, minimal edema  neuro: Normal gait, no focal weakness Psych: Normal mood, full affect  Assessment & Plan:   OSA, severe clinically with nocturnal hypoxemia and hypercarbia: Now on NIPPV at night with cuffed trach.  Encouraged to continue.  Dyspnea on exertion: Likely related to OHS and deconditioning.  Encouraged gradual exercise program.   Return in about 1 year (around 03/24/2025) for f/u Dr. Annella.   Donnice JONELLE Annella, MD 03/24/2024   This appointment required 40 minutes of patient care (this includes precharting, chart review, review of results, face-to-face care, etc.).

## 2024-03-24 NOTE — Patient Instructions (Signed)
 I am glad you are doing better  No changes to medication  Continue ventilator at night  I encourage you to find ways to walk, exercise lightly, build up your stamina  Return to clinic in 1 year or sooner as needed with Dr. Annella

## 2024-04-21 ENCOUNTER — Ambulatory Visit (HOSPITAL_COMMUNITY)
Admission: RE | Admit: 2024-04-21 | Discharge: 2024-04-21 | Disposition: A | Discharge status: Home / Self Care | Source: Ambulatory Visit | Attending: Acute Care | Admitting: Acute Care

## 2024-04-21 DIAGNOSIS — G4733 Obstructive sleep apnea (adult) (pediatric): Secondary | ICD-10-CM | POA: Diagnosis not present

## 2024-04-21 DIAGNOSIS — E662 Morbid (severe) obesity with alveolar hypoventilation: Secondary | ICD-10-CM | POA: Diagnosis present

## 2024-04-21 DIAGNOSIS — Z930 Tracheostomy status: Secondary | ICD-10-CM | POA: Diagnosis present

## 2024-04-21 DIAGNOSIS — J9611 Chronic respiratory failure with hypoxia: Secondary | ICD-10-CM | POA: Diagnosis not present

## 2024-04-21 DIAGNOSIS — Z9911 Dependence on respirator [ventilator] status: Secondary | ICD-10-CM

## 2024-04-21 NOTE — Progress Notes (Addendum)
 Tracheostomy Procedure Note  Angel Brewer [id number] [birth year]   Pre Procedure Tracheostomy Information  Trach Brand: Shiley Size: 6.0  6CN75 Style: Cuffed Secured by: Velcro   Procedure: Trach Cleaning and Trach Change  Lidocaine  tx given prior to trach change   Post Procedure Tracheostomy Information  Trach Brand: Shiley Size: 6.0 6CN75 Style: Cuffed Secured by: Velcro   Post Procedure Evaluation:  ETCO2 positive color change from yellow to purple : Yes.   Vital signs:VSS Patients current condition: stable Complications: No apparent complications Trach site exam: clean, dry Wound care done: 4 x 4 gauze Drain Patient did tolerate procedure well.   Education: none  Prescription needs: none    Additional needs: New PMV given at this visit

## 2024-04-21 NOTE — Progress Notes (Signed)
 Reason for visit.  Trach care   HPI 58 yof known by our service. Last seen in hospital by our service back in June 2023. Her trach was placed May 2023. She was last seen in pulm office 7/31. Since her trach she has not required nocturnal ventilation up until her recent hospitalization, when She came in with acute decompensated heart failure from her OHS ultimately requiring prolonged ventilation and after optimization of volume status was determined she still would benefit from daytime nocturnal ventilation.  She was just discharged On 5/15, she now is has a home ventilator, and a cuffed tracheostomy. She returns today for planned trach change    Review of Systems  All other systems reviewed and are negative.   Exam  General this is a 58 year old female. Well known to me. Presents for trach change in no distress.  HENT NCAT the trach side was unremarkable. Did have scant bloody dc following cuffed trach removal her phonation quality is excellent w/ PMV in place. Pulm some scattered rhonchi that cleared w/ cough Card rrr Abd soft Ext warm  Neuro intact   Procedure  The patient was premedicated with inhaled lidocaine , following that the tracheostomy was removed, sterile lidocaine  jelly applied locally, and then new cuffed 6 shiley tracheostomy placed over obturator without difficulty placement verified with end-tidal CO2. She continues to have significant anticipatory pain when placing the trach. Says the right lateral wall of the stoma pretty uncomfortable. I did not see any lesions to explain this   Impression/plan   Active Problems:   Tracheostomy dependent (HCC)   Chronic hypoxic respiratory failure (HCC)   OSA (obstructive sleep apnea)   Obesity hypoventilation syndrome (HCC)   Ventilator dependence (HCC)   Tracheostomy dependent (HCC) Overview: Size 6 shiley cuffed.  Last changed on 10/9   Discussion Not a candidate for decannulation given chronic respiratory failure with  OHS and OSA as well type III pH.  Seems to be doing well at home.  Stable tracheostomy Plan We will continue routine tracheostomy care Cont q 4 week trach change Keep stoma site dry     Maude FORBES Banner ACNP-BC [facility] Pulmonary/Critical Care Pager # [phone] OR # [phone] if no answer         My time 

## 2024-07-10 ENCOUNTER — Other Ambulatory Visit: Payer: Self-pay

## 2024-07-10 ENCOUNTER — Emergency Department (HOSPITAL_COMMUNITY)
Admission: EM | Admit: 2024-07-10 | Discharge: 2024-07-11 | Disposition: A | Attending: Emergency Medicine | Admitting: Emergency Medicine

## 2024-07-10 ENCOUNTER — Emergency Department (HOSPITAL_COMMUNITY)

## 2024-07-10 ENCOUNTER — Encounter (HOSPITAL_COMMUNITY): Payer: Self-pay

## 2024-07-10 DIAGNOSIS — R0602 Shortness of breath: Secondary | ICD-10-CM | POA: Diagnosis present

## 2024-07-10 DIAGNOSIS — J9611 Chronic respiratory failure with hypoxia: Secondary | ICD-10-CM | POA: Diagnosis not present

## 2024-07-10 DIAGNOSIS — J810 Acute pulmonary edema: Secondary | ICD-10-CM | POA: Diagnosis not present

## 2024-07-10 DIAGNOSIS — I5032 Chronic diastolic (congestive) heart failure: Secondary | ICD-10-CM | POA: Insufficient documentation

## 2024-07-10 DIAGNOSIS — Z7901 Long term (current) use of anticoagulants: Secondary | ICD-10-CM | POA: Insufficient documentation

## 2024-07-10 DIAGNOSIS — Z79899 Other long term (current) drug therapy: Secondary | ICD-10-CM | POA: Insufficient documentation

## 2024-07-10 DIAGNOSIS — I110 Hypertensive heart disease with heart failure: Secondary | ICD-10-CM | POA: Insufficient documentation

## 2024-07-10 LAB — CBC WITH DIFFERENTIAL/PLATELET
Abs Immature Granulocytes: 0.03 K/uL (ref 0.00–0.07)
Basophils Absolute: 0.1 K/uL (ref 0.0–0.1)
Basophils Relative: 1 %
Eosinophils Absolute: 0.1 K/uL (ref 0.0–0.5)
Eosinophils Relative: 1 %
HCT: 44.2 % (ref 36.0–46.0)
Hemoglobin: 13.2 g/dL (ref 12.0–15.0)
Immature Granulocytes: 1 %
Lymphocytes Relative: 19 %
Lymphs Abs: 1 K/uL (ref 0.7–4.0)
MCH: 29.4 pg (ref 26.0–34.0)
MCHC: 29.9 g/dL — ABNORMAL LOW (ref 30.0–36.0)
MCV: 98.4 fL (ref 80.0–100.0)
Monocytes Absolute: 0.5 K/uL (ref 0.1–1.0)
Monocytes Relative: 10 %
Neutro Abs: 3.6 K/uL (ref 1.7–7.7)
Neutrophils Relative %: 68 %
Platelets: 185 K/uL (ref 150–400)
RBC: 4.49 MIL/uL (ref 3.87–5.11)
RDW: 16 % — ABNORMAL HIGH (ref 11.5–15.5)
WBC: 5.3 K/uL (ref 4.0–10.5)
nRBC: 1.7 % — ABNORMAL HIGH (ref 0.0–0.2)

## 2024-07-10 NOTE — ED Triage Notes (Signed)
 Pt arrives EMS with reports of [facility] over the last few days mostly with exertion. Pt has chronic trach and has been hypoxic today. Pt alert and oriented on arrival.

## 2024-07-10 NOTE — ED Provider Notes (Incomplete)
 " [place] EMERGENCY DEPARTMENT AT [facility] Provider Note   CSN: [id number] Arrival date & time: 07/10/24  2311     Patient presents with: Shortness of Breath   Angel Brewer is a 58 y.o. female.  {Add pertinent medical, surgical, social history, OB history to HPI:32947} HPI     This a 58 year old female with chronic respiratory failure and tracheostomy who presents with shortness of breath.  Patient reports 1 week history of worsening shortness of breath.  States she gets more short of breath with exertion.  No chest pain.  Tonight she woke up short of breath.  She is on the ventilator at night routinely.  She did note that the ventilator had become unhooked; however this has happened in the past and she does not get short of breath.  No recent fevers.  Reports a chronic cough.  Prior to Admission medications  Medication Sig Start Date End Date Taking? Authorizing Provider  amLODipine  (NORVASC ) 5 MG tablet Take 1 tablet (5 mg total) by mouth daily. 01/19/24   Theotis Haze ORN, NP  apixaban  (ELIQUIS ) 5 MG TABS tablet Take 1 tablet (5 mg total) by mouth 2 (two) times daily. 06/17/23   Fleming, Zelda W, NP  atorvastatin  (LIPITOR) 20 MG tablet Take 1 tablet (20 mg total) by mouth daily. 01/19/24   Fleming, Zelda W, NP  dapagliflozin  propanediol (FARXIGA ) 10 MG TABS tablet Take 1 tablet (10 mg total) by mouth daily. 01/19/24   Fleming, Zelda W, NP  furosemide  (LASIX ) 20 MG tablet Take 1 tablet (20 mg total) by mouth daily. 01/19/24   Theotis Haze ORN, NP  Misc. Devices MISC Please supply patient with tracheostomy humidifier  Z93.0 03/25/22   Fleming, Zelda W, NP  pantoprazole  (PROTONIX ) 40 MG tablet Take 1 tablet (40 mg total) by mouth at bedtime. 11/26/23   Patel, Pranav M, MD  polyethylene glycol (MIRALAX  / GLYCOLAX ) 17 g packet Take 17 g by mouth daily as needed for moderate constipation. 11/26/23   Tobie Yetta HERO, MD  PRESCRIPTION MEDICATION 1 each by Other route at bedtime.  CPAP- At bedtime    [provider]  spironolactone  (ALDACTONE ) 25 MG tablet Take 1 tablet (25 mg total) by mouth daily. 01/19/24   Fleming, Zelda W, NP    Allergies: Patient has no known allergies.    Review of Systems  Constitutional:  Negative for fever.  Respiratory:  Positive for shortness of breath.   Cardiovascular:  Negative for chest pain.  Gastrointestinal:  Negative for abdominal pain, nausea and vomiting.  All other systems reviewed and are negative.   Updated Vital Signs Pulse 76   Temp 98.5 F (36.9 C)   Resp 17   Ht 1.702 m (5' 7)   Wt (!) 181 kg   LMP 11/29/2015   SpO2 95%   BMI 62.49 kg/m   Physical Exam Vitals and nursing note reviewed.  Constitutional:      Appearance: She is well-developed.     Comments: Morbidly obese  HENT:     Head: Normocephalic and atraumatic.  Eyes:     Pupils: Pupils are equal, round, and reactive to light.  Neck:     Comments: Tracheostomy in place Cardiovascular:     Rate and Rhythm: Normal rate and regular rhythm.     Heart sounds: Normal heart sounds.  Pulmonary:     Effort: Pulmonary effort is normal. No respiratory distress.     Breath sounds: No wheezing.  Comments: Speaking in full sentences, no respiratory distress noted, pulmonary exam severely limited secondary to body habitus Abdominal:     General: Bowel sounds are normal.     Palpations: Abdomen is soft.  Musculoskeletal:     Right lower leg: Edema present.     Left lower leg: Edema present.  Skin:    General: Skin is warm and dry.  Neurological:     Mental Status: She is alert and oriented to person, place, and time.  Psychiatric:        Mood and Affect: Mood normal.     (all labs ordered are listed, but only abnormal results are displayed) Labs Reviewed  CBC WITH DIFFERENTIAL/PLATELET - Abnormal; Notable for the following components:      Result Value   MCHC 29.9 (*)    RDW 16.0 (*)    nRBC 1.7 (*)    All other components within  normal limits  BASIC METABOLIC PANEL WITH GFR  PRO BRAIN NATRIURETIC PEPTIDE  I-STAT ARTERIAL BLOOD GAS, ED    EKG: None  Radiology: No results found.  {Document cardiac monitor, telemetry assessment procedure when appropriate:32947} Procedures   Medications Ordered in the ED - No data to display    {Click here for ABCD2, HEART and other calculators REFRESH Note before signing:1}                              Medical Decision Making Amount and/or Complexity of Data Reviewed Labs: ordered. Radiology: ordered.   ***  {Document critical care time when appropriate  Document review of labs and clinical decision tools ie CHADS2VASC2, etc  Document your independent review of radiology images and any outside records  Document your discussion with family members, caretakers and with consultants  Document social determinants of health affecting pt's care  Document your decision making why or why not admission, treatments were needed:32947:::1}   Final diagnoses:  None    ED Discharge Orders     None        "

## 2024-07-11 ENCOUNTER — Emergency Department (HOSPITAL_COMMUNITY)

## 2024-07-11 LAB — I-STAT VENOUS BLOOD GAS, ED
Acid-Base Excess: 5 mmol/L — ABNORMAL HIGH (ref 0.0–2.0)
Bicarbonate: 30.8 mmol/L — ABNORMAL HIGH (ref 20.0–28.0)
Calcium, Ion: 1.14 mmol/L — ABNORMAL LOW (ref 1.15–1.40)
HCT: 41 % (ref 36.0–46.0)
Hemoglobin: 13.9 g/dL (ref 12.0–15.0)
O2 Saturation: 53 %
Patient temperature: 98.5
Potassium: 3.9 mmol/L (ref 3.5–5.1)
Sodium: 139 mmol/L (ref 135–145)
TCO2: 32 mmol/L (ref 22–32)
pCO2, Ven: 48.8 mmHg (ref 44–60)
pH, Ven: 7.408 (ref 7.25–7.43)
pO2, Ven: 28 mmHg — CL (ref 32–45)

## 2024-07-11 LAB — BASIC METABOLIC PANEL WITH GFR
Anion gap: 9 (ref 5–15)
BUN: 15 mg/dL (ref 6–20)
CO2: 27 mmol/L (ref 22–32)
Calcium: 8.9 mg/dL (ref 8.9–10.3)
Chloride: 103 mmol/L (ref 98–111)
Creatinine, Ser: 0.91 mg/dL (ref 0.44–1.00)
GFR, Estimated: >60 mL/min
Glucose, Bld: 89 mg/dL (ref 70–99)
Potassium: 4.6 mmol/L (ref 3.5–5.1)
Sodium: 139 mmol/L (ref 135–145)

## 2024-07-11 LAB — PRO BRAIN NATRIURETIC PEPTIDE: Pro Brain Natriuretic Peptide: 102 pg/mL

## 2024-07-11 LAB — TROPONIN T, HIGH SENSITIVITY: Troponin T High Sensitivity: <15 ng/L (ref 0–19)

## 2024-07-11 MED ORDER — FUROSEMIDE 20 MG PO TABS: 40.0000 mg | ORAL_TABLET | Freq: Every day | ORAL | 1 refills | Status: DC

## 2024-07-11 MED ORDER — FUROSEMIDE 10 MG/ML IJ SOLN
40.0000 mg | Freq: Once | INTRAMUSCULAR | Status: AC
  Administered 2024-07-11: 40 mg via INTRAVENOUS
  Filled 2024-07-11: qty 4

## 2024-07-11 NOTE — Discharge Instructions (Signed)
 You were seen today for worsening shortness of breath.  You have evidence of some fluid overload.  You need to reinitiate your spironolactone .  Will increase your Lasix  for the next 10 days and have you follow-up closely with your primary doctor for recheck of labs and respiratory status.  If anything changes in the meantime, you should be reevaluated.

## 2024-07-15 ENCOUNTER — Inpatient Hospital Stay (HOSPITAL_COMMUNITY)

## 2024-07-15 ENCOUNTER — Encounter (HOSPITAL_COMMUNITY): Payer: Self-pay

## 2024-07-15 ENCOUNTER — Ambulatory Visit (HOSPITAL_COMMUNITY)
Admission: RE | Admit: 2024-07-15 | Discharge: 2024-07-15 | Disposition: A | Discharge status: Home / Self Care | Source: Ambulatory Visit | Attending: Nurse Practitioner | Admitting: Nurse Practitioner

## 2024-07-15 ENCOUNTER — Inpatient Hospital Stay (HOSPITAL_COMMUNITY): Admission: EM | Admit: 2024-07-15 | Discharge: 2024-07-20 | Disposition: A

## 2024-07-15 DIAGNOSIS — E662 Morbid (severe) obesity with alveolar hypoventilation: Secondary | ICD-10-CM | POA: Diagnosis present

## 2024-07-15 DIAGNOSIS — I110 Hypertensive heart disease with heart failure: Secondary | ICD-10-CM | POA: Diagnosis present

## 2024-07-15 DIAGNOSIS — I5032 Chronic diastolic (congestive) heart failure: Secondary | ICD-10-CM

## 2024-07-15 DIAGNOSIS — J9611 Chronic respiratory failure with hypoxia: Secondary | ICD-10-CM | POA: Diagnosis not present

## 2024-07-15 DIAGNOSIS — E1165 Type 2 diabetes mellitus with hyperglycemia: Secondary | ICD-10-CM | POA: Diagnosis present

## 2024-07-15 DIAGNOSIS — I2723 Pulmonary hypertension due to lung diseases and hypoxia: Secondary | ICD-10-CM | POA: Diagnosis present

## 2024-07-15 DIAGNOSIS — Z008 Encounter for other general examination: Secondary | ICD-10-CM | POA: Insufficient documentation

## 2024-07-15 DIAGNOSIS — R0603 Acute respiratory distress: Secondary | ICD-10-CM

## 2024-07-15 DIAGNOSIS — K219 Gastro-esophageal reflux disease without esophagitis: Secondary | ICD-10-CM | POA: Diagnosis present

## 2024-07-15 DIAGNOSIS — J0410 Acute tracheitis without obstruction: Secondary | ICD-10-CM | POA: Diagnosis present

## 2024-07-15 DIAGNOSIS — I2781 Cor pulmonale (chronic): Secondary | ICD-10-CM | POA: Diagnosis present

## 2024-07-15 DIAGNOSIS — G4733 Obstructive sleep apnea (adult) (pediatric): Secondary | ICD-10-CM

## 2024-07-15 DIAGNOSIS — Z79899 Other long term (current) drug therapy: Secondary | ICD-10-CM

## 2024-07-15 DIAGNOSIS — Z87891 Personal history of nicotine dependence: Secondary | ICD-10-CM | POA: Diagnosis not present

## 2024-07-15 DIAGNOSIS — I5033 Acute on chronic diastolic (congestive) heart failure: Secondary | ICD-10-CM

## 2024-07-15 DIAGNOSIS — Z86711 Personal history of pulmonary embolism: Secondary | ICD-10-CM | POA: Diagnosis not present

## 2024-07-15 DIAGNOSIS — Z86718 Personal history of other venous thrombosis and embolism: Secondary | ICD-10-CM | POA: Diagnosis not present

## 2024-07-15 DIAGNOSIS — I5031 Acute diastolic (congestive) heart failure: Secondary | ICD-10-CM | POA: Diagnosis present

## 2024-07-15 DIAGNOSIS — J9621 Acute and chronic respiratory failure with hypoxia: Principal | ICD-10-CM

## 2024-07-15 DIAGNOSIS — Z8249 Family history of ischemic heart disease and other diseases of the circulatory system: Secondary | ICD-10-CM | POA: Diagnosis not present

## 2024-07-15 DIAGNOSIS — B9561 Methicillin susceptible Staphylococcus aureus infection as the cause of diseases classified elsewhere: Secondary | ICD-10-CM | POA: Diagnosis not present

## 2024-07-15 DIAGNOSIS — Z930 Tracheostomy status: Secondary | ICD-10-CM | POA: Diagnosis not present

## 2024-07-15 DIAGNOSIS — Z7901 Long term (current) use of anticoagulants: Secondary | ICD-10-CM

## 2024-07-15 DIAGNOSIS — E785 Hyperlipidemia, unspecified: Secondary | ICD-10-CM

## 2024-07-15 DIAGNOSIS — E7800 Pure hypercholesterolemia, unspecified: Secondary | ICD-10-CM

## 2024-07-15 DIAGNOSIS — J151 Pneumonia due to Pseudomonas: Secondary | ICD-10-CM | POA: Diagnosis present

## 2024-07-15 DIAGNOSIS — Z9911 Dependence on respirator [ventilator] status: Secondary | ICD-10-CM

## 2024-07-15 DIAGNOSIS — N179 Acute kidney failure, unspecified: Secondary | ICD-10-CM | POA: Diagnosis present

## 2024-07-15 LAB — ECHOCARDIOGRAM COMPLETE
AR max vel: 1.9 cm2
AV Area VTI: 2.03 cm2
AV Area mean vel: 1.95 cm2
AV Mean grad: 6 mmHg
AV Peak grad: 11.4 mmHg
Ao pk vel: 1.69 m/s
Area-P 1/2: 6.48 cm2
Height: 67 in
S' Lateral: 3.2 cm

## 2024-07-15 LAB — CBC WITH DIFFERENTIAL/PLATELET
Abs Immature Granulocytes: 0.08 K/uL — ABNORMAL HIGH (ref 0.00–0.07)
Basophils Absolute: 0.1 K/uL (ref 0.0–0.1)
Basophils Relative: 1 %
Eosinophils Absolute: 0 K/uL (ref 0.0–0.5)
Eosinophils Relative: 0 %
HCT: 52.9 % — ABNORMAL HIGH (ref 36.0–46.0)
Hemoglobin: 15.9 g/dL — ABNORMAL HIGH (ref 12.0–15.0)
Immature Granulocytes: 1 %
Lymphocytes Relative: 13 %
Lymphs Abs: 1 K/uL (ref 0.7–4.0)
MCH: 28.9 pg (ref 26.0–34.0)
MCHC: 30.1 g/dL (ref 30.0–36.0)
MCV: 96.2 fL (ref 80.0–100.0)
Monocytes Absolute: 0.8 K/uL (ref 0.1–1.0)
Monocytes Relative: 10 %
Neutro Abs: 5.7 K/uL (ref 1.7–7.7)
Neutrophils Relative %: 75 %
Platelets: 181 K/uL (ref 150–400)
RBC: 5.5 MIL/uL — ABNORMAL HIGH (ref 3.87–5.11)
RDW: 17.6 % — ABNORMAL HIGH (ref 11.5–15.5)
WBC: 7.6 K/uL (ref 4.0–10.5)
nRBC: 2.4 % — ABNORMAL HIGH (ref 0.0–0.2)

## 2024-07-15 LAB — RESP PANEL BY RT-PCR (RSV, FLU A&B, COVID)  RVPGX2
Influenza A by PCR: NEGATIVE
Influenza B by PCR: NEGATIVE
Resp Syncytial Virus by PCR: NEGATIVE
SARS Coronavirus 2 by RT PCR: NEGATIVE

## 2024-07-15 LAB — COMPREHENSIVE METABOLIC PANEL WITH GFR
ALT: <5 U/L (ref 0–44)
AST: 20 U/L (ref 15–41)
Albumin: 3.9 g/dL (ref 3.5–5.0)
Alkaline Phosphatase: 95 U/L (ref 38–126)
Anion gap: 11 (ref 5–15)
BUN: 17 mg/dL (ref 6–20)
CO2: 35 mmol/L — ABNORMAL HIGH (ref 22–32)
Calcium: 9.2 mg/dL (ref 8.9–10.3)
Chloride: 95 mmol/L — ABNORMAL LOW (ref 98–111)
Creatinine, Ser: 1.25 mg/dL — ABNORMAL HIGH (ref 0.44–1.00)
GFR, Estimated: 50 mL/min — ABNORMAL LOW
Glucose, Bld: 86 mg/dL (ref 70–99)
Potassium: 4.4 mmol/L (ref 3.5–5.1)
Sodium: 140 mmol/L (ref 135–145)
Total Bilirubin: 1.5 mg/dL — ABNORMAL HIGH (ref 0.0–1.2)
Total Protein: 8.7 g/dL — ABNORMAL HIGH (ref 6.5–8.1)

## 2024-07-15 LAB — RESPIRATORY PANEL BY PCR

## 2024-07-15 LAB — BLOOD GAS, VENOUS
Acid-Base Excess: 10.4 mmol/L — ABNORMAL HIGH (ref 0.0–2.0)
Bicarbonate: 40.9 mmol/L — ABNORMAL HIGH (ref 20.0–28.0)
O2 Saturation: 50.6 %
Patient temperature: 37.5
pCO2, Ven: 89 mmHg (ref 44–60)
pH, Ven: 7.27 (ref 7.25–7.43)
pO2, Ven: 41 mmHg (ref 32–45)

## 2024-07-15 LAB — MAGNESIUM: Magnesium: 2.1 mg/dL (ref 1.7–2.4)

## 2024-07-15 LAB — PRO BRAIN NATRIURETIC PEPTIDE: Pro Brain Natriuretic Peptide: 388 pg/mL — ABNORMAL HIGH

## 2024-07-15 LAB — GLUCOSE, CAPILLARY
Glucose-Capillary: 106 mg/dL — ABNORMAL HIGH (ref 70–99)
Glucose-Capillary: 80 mg/dL (ref 70–99)

## 2024-07-15 LAB — HEMOGLOBIN A1C
Hgb A1c MFr Bld: 5.4 % (ref 4.8–5.6)
Mean Plasma Glucose: 108.28 mg/dL

## 2024-07-15 LAB — MRSA NEXT GEN BY PCR, NASAL: MRSA by PCR Next Gen: NOT DETECTED

## 2024-07-15 LAB — PROCALCITONIN: Procalcitonin: 0.2 ng/mL

## 2024-07-15 LAB — LACTIC ACID, PLASMA: Lactic Acid, Venous: 1.6 mmol/L (ref 0.5–1.9)

## 2024-07-15 MED ORDER — LIDOCAINE HCL (PF) 2% IJ FOR NEBU: 5.0000 mL | Freq: Once | RESPIRATORY_TRACT | Status: DC

## 2024-07-15 MED ORDER — FUROSEMIDE 10 MG/ML IJ SOLN
80.0000 mg | Freq: Two times a day (BID) | INTRAMUSCULAR | Status: DC
  Administered 2024-07-15 – 2024-07-16 (×2): 80 mg via INTRAVENOUS
  Filled 2024-07-15 (×2): qty 8

## 2024-07-15 MED ORDER — INSULIN ASPART 100 UNIT/ML IJ SOLN
0.0000 [IU] | Freq: Three times a day (TID) | INTRAMUSCULAR | Status: DC
  Administered 2024-07-18 – 2024-07-19 (×2): 2 [IU] via SUBCUTANEOUS
  Filled 2024-07-15 (×2): qty 2

## 2024-07-15 MED ORDER — PANTOPRAZOLE SODIUM 40 MG PO TBEC
40.0000 mg | DELAYED_RELEASE_TABLET | Freq: Every day | ORAL | Status: DC
  Administered 2024-07-15 – 2024-07-19 (×5): 40 mg via ORAL
  Filled 2024-07-15 (×5): qty 1

## 2024-07-15 MED ORDER — AMLODIPINE BESYLATE 5 MG PO TABS
5.0000 mg | ORAL_TABLET | Freq: Every day | ORAL | Status: DC
  Administered 2024-07-16 – 2024-07-18 (×3): 5 mg via ORAL
  Filled 2024-07-15 (×3): qty 1

## 2024-07-15 MED ORDER — POLYETHYLENE GLYCOL 3350 17 G PO PACK: 17.0000 g | PACK | Freq: Every day | ORAL | Status: DC | PRN

## 2024-07-15 MED ORDER — LIDOCAINE HCL (PF) 1 % IJ SOLN
5.0000 mL | Freq: Once | INTRAMUSCULAR | Status: AC
  Administered 2024-07-15: 5 mL
  Filled 2024-07-15: qty 5

## 2024-07-15 MED ORDER — SPIRONOLACTONE 25 MG PO TABS
25.0000 mg | ORAL_TABLET | Freq: Every day | ORAL | Status: DC
  Administered 2024-07-16 – 2024-07-20 (×5): 25 mg via ORAL
  Filled 2024-07-15 (×5): qty 1

## 2024-07-15 MED ORDER — LIDOCAINE HCL URETHRAL/MUCOSAL 2 % EX GEL
1.0000 | Freq: Once | CUTANEOUS | Status: AC
  Administered 2024-07-15: 1 via INTRATRACHEAL
  Filled 2024-07-15: qty 6

## 2024-07-15 MED ORDER — ATORVASTATIN CALCIUM 10 MG PO TABS
20.0000 mg | ORAL_TABLET | Freq: Every day | ORAL | Status: DC
  Administered 2024-07-16 – 2024-07-20 (×5): 20 mg via ORAL
  Filled 2024-07-15 (×5): qty 2

## 2024-07-15 MED ORDER — ALBUTEROL SULFATE (2.5 MG/3ML) 0.083% IN NEBU: 2.5000 mg | INHALATION_SOLUTION | RESPIRATORY_TRACT | Status: DC | PRN

## 2024-07-15 MED ORDER — APIXABAN 5 MG PO TABS
5.0000 mg | ORAL_TABLET | Freq: Two times a day (BID) | ORAL | Status: DC
  Administered 2024-07-15 – 2024-07-20 (×10): 5 mg via ORAL
  Filled 2024-07-15 (×10): qty 1

## 2024-07-15 MED ORDER — CHLORHEXIDINE GLUCONATE CLOTH 2 % EX PADS
6.0000 | MEDICATED_PAD | Freq: Every day | CUTANEOUS | Status: DC
  Administered 2024-07-15 – 2024-07-16 (×2): 6 via TOPICAL

## 2024-07-15 MED ORDER — SENNA 8.6 MG PO TABS: 1.0000 | ORAL_TABLET | Freq: Two times a day (BID) | ORAL | Status: DC | PRN

## 2024-07-15 NOTE — Progress Notes (Signed)
 RT NOTE:  Pt seen in trach clinic, pt saturations as low as 59%, per Jenna, NP pt admitted to [facility] ICU. [name] was not changed today due to unstable vital signs.

## 2024-07-15 NOTE — Plan of Care (Signed)

## 2024-07-15 NOTE — Procedures (Signed)
 Tracheostomy tube change: Informed verbal consent was obtained after explaining the risks (including bleeding and infection), benefits and alternatives of the procedure. Verbal timeout was performed prior to the procedure. She was pre-medicated w/ inhaled and lidocaine  Jelly. The old  #6 cuffed trach was carefully removed. the tracheostomy site appeared: unremarkable. A new #  Cuffed trach was easily placed in the tracheostomy stoma and secured with velcro trach ties. The tracheostomy was patent, good color change observed via EZ-CAP, and the patient was easily able to voice with finger occlusion and tolerated the procedure well with no immediate complications.   Angel Brewer Banner ACNP-BC [facility] Pulmonary/Critical Care Pager # [phone] OR # [phone] if no answer

## 2024-07-15 NOTE — Progress Notes (Signed)
 Abg reviewed Asymptomatic Plan  Will get her on vent a [place] earlier tonight (after she finishes dinner but before 8) while we cont diuretics

## 2024-07-15 NOTE — Progress Notes (Addendum)
 eLink Physician-Brief Progress Note Patient Name: Angel Brewer Street DOB: 01/16/1966 MRN: [id number]   Date of Service  07/15/2024  HPI/Events of Note  56F trach dependent admitted for AoC hypoxemic respiratory failure 2/2 pulmonary edema. CXR c/w pulm edema  Notified SpO2 86 % on FIO2 40% and PEEP 8.   eICU Interventions  FIO2 increased to 50% with improved sats Lasix  orders in   11:00 PM Increased PEEP 10 for borderline SpO2 89-90%  Intervention Category Intermediate Interventions: Respiratory distress - evaluation and management  Angel Brewer Staff 07/15/2024, 8:35 PM

## 2024-07-15 NOTE — H&P (Signed)
 "  NAME:  Angel Brewer, MRN:  [id number], DOB:  Apr 29, 1966, LOS: 0 ADMISSION DATE:  07/15/2024, CONSULTATION DATE:  1/2 REFERRING MD: Tracheostomy clinic Zamia Tyminski NP, CHIEF COMPLAINT: Acute on chronic respiratory failure  History of Present Illness:  This is a 59 year old female patient who lives at home independently.  Independent in ADLs at baseline.  She is tracheostomy dependent with chronic hypoxic and hypercarbic respiratory failure in the setting of severe OHS and OSA complicated by type III pH, diastolic dysfunction, and prior decompensated cor pulmonale.  She is nocturnally ventilator dependent.  She presented on 1/2 for planned routine tracheostomy change at the tracheostomy clinic.  Upon arrival reporting she was feeling very poorly, specifically very short of breath, afraid to go to sleep in fear she might not wake up.  She had been seen In the emergency room on the 29th with these complaints, portable chest x-ray showed diffuse pulmonary edema, she was given intravenous furosemide , and instructed to double her home diuretic, later she was discharged to home from the ER.  On further questioning she began to have increased shortness of breath over the last 4 to 5 weeks prior to presentation.  Also had been having difficulty tolerating her nocturnal ventilation and over the last 2 weeks prior to presentation not using it at all.    After leaving the emergency room she continued to take her Lasix  at increased dose recommendation, presented to tracheostomy clinic reporting only marginal improvement but just still not returning to baseline.  Pulse oximetry was placed noting room air pulse oximetry at 59%, she denied fever, significant cough, chest pain, she has not had any sick exposure.  She is a had a hard time sleeping and has not been able to sleep due to shortness of breath.  She was placed on supplemental oxygen  at 4 L which brought her pulse oximetry up to 88% she was subsequently  directly admitted to the intensive care  Pertinent  Medical History  Chronic hypoxic respiratory failure Chronic hypercarbic respiratory failure Obesity hypoventilation syndrome, obstructive sleep apnea, tracheostomy dependent since May 2023 Nocturnally ventilator dependent since February 2025 Diastolic heart failure Type III pH Cor pulmonale Prior admissions for acute on chronic respiratory failure secondary to pulmonary edema from decompensated OHS/OSA Prior DVT and pulmonary emboli on Eliquis  Hypertension Hyperlipidemia Morbid obesity Significant Hospital Events: Including procedures, antibiotic start and stop dates in addition to other pertinent events   1/2 admitted for acute decompensated diastolic heart failure with pulmonary edema diuresis started  Interim History / Subjective:  Feels a Ponti bit better with oxygen   Objective    Blood pressure (!) 110/91, pulse 84, temperature 98 F (36.7 C), temperature source Oral, resp. rate (!) 23, height 5' 7 (1.702 m), last menstrual period 11/29/2015, SpO2 97%.       No intake or output data in the 24 hours ending 07/15/24 1440 There were no vitals filed for this visit.  Examination: General: Pleasant 59 year old female patient ambulatory at baseline presents reporting worsening shortness of breath and activity intolerance HENT: Mucous membranes are moist, has #6 cuffed tracheostomy cuff currently deflated phonation quality is excellent Lungs: Diminished throughout, currently on 6 L nasal cannula saturations 90% able to speak in short phrases Cardiovascular: Distant regular rate and rythm Abdomen: Soft not tender Extremities: Lower extremities are dry and flaky but no edema, she reports she never gets swelling in the extremities or ankles Neuro: Oriented x 3 GU: Due to void  Resolved problem list  Assessment and Plan    Acute on chronic hypoxic respiratory failure secondary to acute pulmonary edema from acute on chronic  heart failure, and decompensated cor pulmonale in the setting of obesity hypoventilation syndrome and obstructive sleep apnea  Plan Admit to the intensive care Portable chest x-ray Echocardiogram Obtain venous blood gas Admitting labs including daily BNP and chemistries Rule out respiratory viral process Twelve-lead EKG IV Lasix , will increase her to 80 mg IV twice a day watching labs daily Strict intake output Continue nocturnal ventilation Have instructed her to have her home ventilator brought to the hospital so we can evaluate it, and see if further titration can be made to make it more comfortable  Tracheostomy dependent Plan Tracheostomy changed 1/2 Follow-up in tracheostomy clinic in 4 weeks postdischarge  Acute on chronic diastolic heart failure, with cor pulmonale Plan Avoid hypoxia IV diuresis Continue her home Norvasc  Hold Farxiga  for now  History of PE and DVT Plan Continue apixaban   Hyperlipidemia Plan Continue statin  Glycemic control Plan AC and at bedtime sliding scale Goal glucose 140s to 180  CBC: Recent Labs  Lab 07/10/24 2332 07/11/24 0125  WBC 5.3  --   NEUTROABS 3.6  --   HGB 13.2 13.9  HCT 44.2 41.0  MCV 98.4  --   PLT 185  --     Basic Metabolic Panel: Recent Labs  Lab 07/10/24 2332 07/11/24 0125  NA 139 139  K 4.6 3.9  CL 103  --   CO2 27  --   GLUCOSE 89  --   BUN 15  --   CREATININE 0.91  --   CALCIUM  8.9  --    GFR: Estimated Creatinine Clearance: 116.4 mL/min (by C-G formula based on SCr of 0.91 mg/dL). Recent Labs  Lab 07/10/24 2332  WBC 5.3    Liver Function Tests: No results for input(s): AST, ALT, ALKPHOS, BILITOT, PROT, ALBUMIN in the last 168 hours. No results for input(s): LIPASE, AMYLASE in the last 168 hours. No results for input(s): AMMONIA in the last 168 hours.  ABG    Component Value Date/Time   PHART 7.399 11/08/2023 0351   PCO2ART 63.2 (H) 11/08/2023 0351   PO2ART 78 (L)  11/08/2023 0351   HCO3 30.8 (H) 07/11/2024 0125   TCO2 32 07/11/2024 0125   ACIDBASEDEF 4.0 (H) 05/01/2021 0158   O2SAT 53 07/11/2024 0125     Coagulation Profile: No results for input(s): INR, PROTIME in the last 168 hours.  Cardiac Enzymes: No results for input(s): CKTOTAL, CKMB, CKMBINDEX, TROPONINI in the last 168 hours.  HbA1C: Hemoglobin A1C  Date/Time Value Ref Range Status  06/10/2016 04:41 PM 5.4  Final   Hgb A1c MFr Bld  Date/Time Value Ref Range Status  10/25/2023 07:29 AM 5.1 4.8 - 5.6 % Final    Comment:    (NOTE) Pre diabetes:          5.7%-6.4%  Diabetes:              >6.4%  Glycemic control for   <7.0% adults with diabetes     CBG: No results for input(s): GLUCAP in the last 168 hours.  Review of Systems:   Review of Systems  Constitutional:  Positive for weight loss. Negative for fever and malaise/fatigue.  HENT: Negative.    Eyes: Negative.   Respiratory:  Positive for shortness of breath and wheezing.   Cardiovascular:  Positive for leg swelling. Negative for chest pain.  Gastrointestinal:  Negative for heartburn.  Genitourinary: Negative.   Skin: Negative.   Neurological: Negative.   Endo/Heme/Allergies: Negative.      Past Medical History:  She,  has a past medical history of Chronic diastolic CHF (congestive heart failure) (HCC) (01/05/2022), DVT (deep vein thrombosis) in pregnancy, Dyspnea, Essential hypertension (08/25/2017), Hyperlipidemia (11/21/2021), Morbid obesity (HCC), Obesity hypoventilation syndrome (HCC) (05/03/2021), PE (pulmonary embolism) (02/29/2016), Sleep apnea, and Tracheostomy present (HCC) (01/05/2022).   Surgical History:   Past Surgical History:  Procedure Laterality Date   CESAREAN SECTION     x 3   OPEN REDUCTION INTERNAL FIXATION (ORIF) DISTAL RADIAL FRACTURE Right 06/20/2019   Procedure: OPEN REDUCTION INTERNAL FIXATION (ORIF) DISTAL RADIAL FRACTURE;  Surgeon: Sebastian Lenis, MD;  Location: [facility]  OR;  Service: Orthopedics;  Laterality: Right;  REGIONAL BLOCK TOTAL SURGERY TIME: 90 MINUTES   SYNOVECTOMY Right 11/21/2019   Procedure: RIGHT WRIST LIGAMENT RECONSTRUCTION;  Surgeon: Sebastian Lenis, MD;  Location: [facility] OR;  Service: Orthopedics;  Laterality: Right;  PROCEDURE: RIGHT WRIST LIGAMENT RECONSTRUCTION LENGTH OF SURGERY: 105 MIN     Social History:   reports that she has quit smoking. She has never used smokeless tobacco. She reports that she does not drink alcohol and does not use drugs.   Family History:  Her family history includes Hypertension in her sister. There is no history of Breast cancer or BRCA 1/2.   Allergies Allergies[1]   Home Medications  Prior to Admission medications  Medication Sig Start Date End Date Taking? Authorizing Provider  amLODipine  (NORVASC ) 5 MG tablet Take 1 tablet (5 mg total) by mouth daily. 01/19/24   Theotis Haze ORN, NP  apixaban  (ELIQUIS ) 5 MG TABS tablet Take 1 tablet (5 mg total) by mouth 2 (two) times daily. 06/17/23   Fleming, Zelda W, NP  atorvastatin  (LIPITOR) 20 MG tablet Take 1 tablet (20 mg total) by mouth daily. 01/19/24   Fleming, Zelda W, NP  dapagliflozin  propanediol (FARXIGA ) 10 MG TABS tablet Take 1 tablet (10 mg total) by mouth daily. 01/19/24   Fleming, Zelda W, NP  furosemide  (LASIX ) 20 MG tablet Take 2 tablets (40 mg total) by mouth daily. 07/11/24   Horton, Charmaine FALCON, MD  Misc. Devices MISC Please supply patient with tracheostomy humidifier  Z93.0 03/25/22   Fleming, Zelda W, NP  pantoprazole  (PROTONIX ) 40 MG tablet Take 1 tablet (40 mg total) by mouth at bedtime. 11/26/23   Patel, Pranav M, MD  polyethylene glycol (MIRALAX  / GLYCOLAX ) 17 g packet Take 17 g by mouth daily as needed for moderate constipation. 11/26/23   Tobie Yetta HERO, MD  PRESCRIPTION MEDICATION 1 each by Other route at bedtime. CPAP- At bedtime    [provider]  spironolactone  (ALDACTONE ) 25 MG tablet Take 1 tablet (25 mg total) by mouth daily. 01/19/24    Theotis Haze ORN, NP     Critical care time:  I personally  spent 38 minutes  on this patient which included: review of medical records, nursing notes, progress notes, evaluation, interpretation of lab data and diagnostic studies, taking independent history, performing exam, documenting plan, ordering diagnostics and interventions for the following critical care issues: Acute respiratory failure with the following interventions which included: prevention of further deterioration                [1] No Known Allergies  "

## 2024-07-16 ENCOUNTER — Inpatient Hospital Stay (HOSPITAL_COMMUNITY)

## 2024-07-16 LAB — PROCALCITONIN: Procalcitonin: 0.18 ng/mL

## 2024-07-16 LAB — CBC
HCT: 47.2 % — ABNORMAL HIGH (ref 36.0–46.0)
Hemoglobin: 14.2 g/dL (ref 12.0–15.0)
MCH: 28.9 pg (ref 26.0–34.0)
MCHC: 30.1 g/dL (ref 30.0–36.0)
MCV: 95.9 fL (ref 80.0–100.0)
Platelets: 194 K/uL (ref 150–400)
RBC: 4.92 MIL/uL (ref 3.87–5.11)
RDW: 17 % — ABNORMAL HIGH (ref 11.5–15.5)
WBC: 6.8 K/uL (ref 4.0–10.5)
nRBC: 2.8 % — ABNORMAL HIGH (ref 0.0–0.2)

## 2024-07-16 LAB — BASIC METABOLIC PANEL WITH GFR
Anion gap: 12 (ref 5–15)
BUN: 19 mg/dL (ref 6–20)
CO2: 31 mmol/L (ref 22–32)
Calcium: 8.7 mg/dL — ABNORMAL LOW (ref 8.9–10.3)
Chloride: 95 mmol/L — ABNORMAL LOW (ref 98–111)
Creatinine, Ser: 1.35 mg/dL — ABNORMAL HIGH (ref 0.44–1.00)
GFR, Estimated: 45 mL/min — ABNORMAL LOW
Glucose, Bld: 85 mg/dL (ref 70–99)
Potassium: 4.8 mmol/L (ref 3.5–5.1)
Sodium: 138 mmol/L (ref 135–145)

## 2024-07-16 LAB — GLUCOSE, CAPILLARY
Glucose-Capillary: 89 mg/dL (ref 70–99)
Glucose-Capillary: 96 mg/dL (ref 70–99)
Glucose-Capillary: 92 mg/dL (ref 70–99)
Glucose-Capillary: 108 mg/dL — ABNORMAL HIGH (ref 70–99)

## 2024-07-16 LAB — PRO BRAIN NATRIURETIC PEPTIDE: Pro Brain Natriuretic Peptide: 330 pg/mL — ABNORMAL HIGH

## 2024-07-16 MED ORDER — FUROSEMIDE 40 MG PO TABS
40.0000 mg | ORAL_TABLET | Freq: Two times a day (BID) | ORAL | Status: DC
  Administered 2024-07-17: 40 mg via ORAL
  Filled 2024-07-16: qty 1

## 2024-07-16 NOTE — Progress Notes (Signed)
 "  NAME:  Angel Brewer, MRN:  [id number], DOB:  [birth year], LOS: 1 ADMISSION DATE:  07/15/2024,  CHIEF COMPLAINT: Acute hypoxic hypercapnic respiratory failure.  History of Present Illness:  This is a 59 year old female patient who lives at home independently.  Independent in ADLs at baseline.  She is tracheostomy dependent with chronic hypoxic and hypercarbic respiratory failure in the setting of severe OHS and OSA complicated by type III pH, diastolic dysfunction, and prior decompensated cor pulmonale.  She is nocturnally ventilator dependent.  She presented on 1/2 for planned routine tracheostomy change at the tracheostomy clinic.  Upon arrival reporting she was feeling very poorly, specifically very short of breath, afraid to go to sleep in fear she might not wake up.  She had been seen In the emergency room on the 29th with these complaints, portable chest x-ray showed diffuse pulmonary edema, she was given intravenous furosemide , and instructed to double her home diuretic, later she was discharged to home from the ER.   On further questioning she began to have increased shortness of breath over the last 4 to 5 weeks prior to presentation.  Also had been having difficulty tolerating her nocturnal ventilation and over the last 2 weeks prior to presentation not using it at all.     After leaving the emergency room she continued to take her Lasix  at increased dose recommendation, presented to tracheostomy clinic reporting only marginal improvement but just still not returning to baseline.  Pulse oximetry was placed noting room air pulse oximetry at 59%, she denied fever, significant cough, chest pain, she has not had any sick exposure.  She is a had a hard time sleeping and has not been able to sleep due to shortness of breath.  She was placed on supplemental oxygen  at 4 L which brought her pulse oximetry up to 88% she was subsequently directly admitted to the intensive care unit.  Pertinent   Medical History   Past Medical History:  Diagnosis Date   Chronic diastolic CHF (congestive heart failure) (HCC) 01/05/2022   DVT (deep vein thrombosis) in pregnancy    RLE DVT 02/2016   Dyspnea    Essential hypertension 08/25/2017   Hyperlipidemia 11/21/2021   Morbid obesity (HCC)    Obesity hypoventilation syndrome (HCC) 05/03/2021   PE (pulmonary embolism) 02/29/2016   Sleep apnea    uses a cpap   Tracheostomy present (HCC) 01/05/2022   Size 6 shiley/cuffless  Last changed: 10/9     Discussion  Stable trach.  She does get a Kiedrowski more short of breath with exertion.  Although I did not observe it on my exam she does endorse she will occasionally have a rush of air after removing PMV following activity.  This could suggest some mild air trapping from the trach.  Otherwise she is doing fantastic with the trach     Plan  Cont routin     Significant Hospital Events: Including procedures, antibiotic start and stop dates in addition to other pertinent events   07/15/2024 -tracheostomy exchanged.  Interim History / Subjective:  Patient is doing well after tracheostomy exchange yesterday.  She did have decent diuresis with IV Lasix .  She is using a PMV eval this morning.  She still feels tired to get out of bed.  She states that she needs to be using nocturnal vent has not used it at home but is ready to use it when she goes back.  Her ventilator is looked at according to her and  it is working well.  Chest x-ray this morning shows similar edema however creatinine slightly elevated this morning.  Objective    Blood pressure (!) 101/45, pulse 64, temperature 97.8 F (36.6 C), temperature source Oral, resp. rate 17, height 5' 7 (1.702 m), weight (!) 180 kg, last menstrual period 11/29/2015, SpO2 (!) 86%.    Vent Mode: PRVC FiO2 (%):  [40 %-100 %] 70 % Set Rate:  [14 bmp] 14 bmp Vt Set:  [490 mL] 490 mL PEEP:  [8 cmH20-10 cmH20] 10 cmH20 Plateau Pressure:  [15 cmH20-29 cmH20] 29 cmH20    Intake/Output Summary (Last 24 hours) at 07/16/2024 1330 Last data filed at 07/16/2024 0800 Gross per 24 hour  Intake 120 ml  Output 1550 ml  Net -1430 ml   Filed Weights   07/15/24 1400  Weight: (!) 180 kg    Examination: Physical exam: General: Alert oriented lying in bed no distress. HEENT: [place]/AT, eyes anicteric.  moist mucus membranes tracheostomy present currently using PMV valve. Neuro: Alert, awake following commands Chest: Coarse breath sounds, no wheezes or rhonchi Heart: Regular rate and rhythm, no murmurs or gallops Abdomen: Soft, nontender, nondistended, bowel sounds present   Resolved problem list   Assessment and Plan  Acute on chronic hypoxic respiratory failure secondary to acute pulmonary edema from acute on chronic heart failure, and decompensated cor pulmonale in the setting of obesity hypoventilation syndrome and obstructive sleep apnea  Plan  Echocardiogram -shows EF is 55 to 60% normal LV function. proBNP is 330 today. Rule out respiratory viral process Twelve-lead EKG Patient got 1 dose of IV Lasix  at 80 mg this morning.  Will change to 40 twice daily orally from tomorrow. Strict intake output Continue nocturnal ventilation We have instructed her to have her home ventilator brought to the hospital so we can evaluate it, and see if further titration can be made to make it more comfortable   Tracheostomy dependent Plan Tracheostomy changed 1/2 Follow-up in tracheostomy clinic in 4 weeks postdischarge   Acute on chronic diastolic heart failure, with cor pulmonale Plan Avoid hypoxia Diuresis as planned Continue her home Norvasc  Hold Farxiga  for now.   History of PE and DVT Plan Continue apixaban    Hyperlipidemia Plan Continue statin   Glycemic control Plan AC and at bedtime sliding scale Goal glucose 140s to 180  OT PT to evaluate. If patient is stable she could be discharged home in the next 24 to 48 hours. Keep the Foley in until  tomorrow likely removed tomorrow based on urine output.  Labs   CBC: Recent Labs  Lab 07/10/24 2332 07/11/24 0125 07/15/24 2015 07/16/24 0135  WBC 5.3  --  7.6 6.8  NEUTROABS 3.6  --  5.7  --   HGB 13.2 13.9 15.9* 14.2  HCT 44.2 41.0 52.9* 47.2*  MCV 98.4  --  96.2 95.9  PLT 185  --  181 194    Basic Metabolic Panel: Recent Labs  Lab 07/10/24 2332 07/11/24 0125 07/15/24 1805 07/16/24 0135  NA 139 139 140 138  K 4.6 3.9 4.4 4.8  CL 103  --  95* 95*  CO2 27  --  35* 31  GLUCOSE 89  --  86 85  BUN 15  --  17 19  CREATININE 0.91  --  1.25* 1.35*  CALCIUM  8.9  --  9.2 8.7*  MG  --   --  2.1  --    GFR: Estimated Creatinine Clearance: 78.2 mL/min (A) (by C-G  formula based on SCr of 1.35 mg/dL (H)). Recent Labs  Lab 07/10/24 2332 07/15/24 1636 07/15/24 1805 07/15/24 2015 07/16/24 0135  PROCALCITON  --   --  0.20  --  0.18  WBC 5.3  --   --  7.6 6.8  LATICACIDVEN  --  1.6  --   --   --     Liver Function Tests: Recent Labs  Lab 07/15/24 1805  AST 20  ALT <5  ALKPHOS 95  BILITOT 1.5*  PROT 8.7*  ALBUMIN 3.9   No results for input(s): LIPASE, AMYLASE in the last 168 hours. No results for input(s): AMMONIA in the last 168 hours.  ABG    Component Value Date/Time   PHART 7.399 11/08/2023 0351   PCO2ART 63.2 (H) 11/08/2023 0351   PO2ART 78 (L) 11/08/2023 0351   HCO3 40.9 (H) 07/15/2024 1636   TCO2 32 07/11/2024 0125   ACIDBASEDEF 4.0 (H) 05/01/2021 0158   O2SAT 50.6 07/15/2024 1636     Coagulation Profile: No results for input(s): INR, PROTIME in the last 168 hours.  Cardiac Enzymes: No results for input(s): CKTOTAL, CKMB, CKMBINDEX, TROPONINI in the last 168 hours.  HbA1C: Hgb A1c MFr Bld  Date/Time Value Ref Range Status  07/15/2024 04:36 PM 5.4 4.8 - 5.6 % Final    Comment:    (NOTE) Diagnosis of Diabetes The following HbA1c ranges recommended by the American Diabetes Association (ADA) may be used as an aid in the  diagnosis of diabetes mellitus.  Hemoglobin             Suggested A1C NGSP%              Diagnosis  <5.7                   Non Diabetic  5.7-6.4                Pre-Diabetic  >6.4                   Diabetic  <7.0                   Glycemic control for                       adults with diabetes.    10/25/2023 07:29 AM 5.1 4.8 - 5.6 % Final    Comment:    (NOTE) Pre diabetes:          5.7%-6.4%  Diabetes:              >6.4%  Glycemic control for   <7.0% adults with diabetes     CBG: Recent Labs  Lab 07/15/24 1649 07/15/24 2123 07/16/24 0645 07/16/24 1136  GLUCAP 106* 80 89 96    Review of Systems:   12 point review of systems done which is negative for anything else what is mentioned in HPI.  Past Medical History:  She,  has a past medical history of Chronic diastolic CHF (congestive heart failure) (HCC) (01/05/2022), DVT (deep vein thrombosis) in pregnancy, Dyspnea, Essential hypertension (08/25/2017), Hyperlipidemia (11/21/2021), Morbid obesity (HCC), Obesity hypoventilation syndrome (HCC) (05/03/2021), PE (pulmonary embolism) (02/29/2016), Sleep apnea, and Tracheostomy present (HCC) (01/05/2022).   Surgical History:   Past Surgical History:  Procedure Laterality Date   CESAREAN SECTION     x 3   OPEN REDUCTION INTERNAL FIXATION (ORIF) DISTAL RADIAL FRACTURE Right 06/20/2019   Procedure: OPEN REDUCTION INTERNAL FIXATION (ORIF) DISTAL  RADIAL FRACTURE;  Surgeon: Sebastian Lenis, MD;  Location: [facility] OR;  Service: Orthopedics;  Laterality: Right;  REGIONAL BLOCK TOTAL SURGERY TIME: 90 MINUTES   SYNOVECTOMY Right 11/21/2019   Procedure: RIGHT WRIST LIGAMENT RECONSTRUCTION;  Surgeon: Sebastian Lenis, MD;  Location[facility] OR;  Service: Orthopedics;  Laterality: Right;  PROCEDURE: RIGHT WRIST LIGAMENT RECONSTRUCTION LENGTH OF SURGERY: 105 MIN     Social History:   reports that she has quit smoking. She has never used smokeless tobacco. She reports that she does not drink  alcohol and does not use drugs.   Family History:  Her family history includes Hypertension in her sister. There is no history of Breast cancer or BRCA 1/2.   Allergies Allergies[1]   Home Medications  Prior to Admission medications  Medication Sig Start Date End Date Taking? Authorizing Provider  amLODipine  (NORVASC ) 5 MG tablet Take 1 tablet (5 mg total) by mouth daily. 01/19/24  Yes Theotis Haze ORN, NP  apixaban  (ELIQUIS ) 5 MG TABS tablet Take 1 tablet (5 mg total) by mouth 2 (two) times daily. 06/17/23  Yes Theotis Haze ORN, NP  atorvastatin  (LIPITOR) 20 MG tablet Take 1 tablet (20 mg total) by mouth daily. 01/19/24  Yes Theotis Haze ORN, NP  dapagliflozin  propanediol (FARXIGA ) 10 MG TABS tablet Take 1 tablet (10 mg total) by mouth daily. 01/19/24  Yes Theotis Haze ORN, NP  furosemide  (LASIX ) 20 MG tablet Take 2 tablets (40 mg total) by mouth daily. 07/11/24  Yes Horton, Charmaine FALCON, MD  spironolactone  (ALDACTONE ) 25 MG tablet Take 1 tablet (25 mg total) by mouth daily. 01/19/24  Yes Theotis Haze ORN, NP  UNABLE TO FIND 1 Device by Does not apply route at bedtime. CPAP, every night.   Yes [provider]     Critical care time: 40 Minutes.     Tamela Stakes, MD  Attending Physician, Critical Care Medicine [facility] See Amion for pager If no response to pager, please call [phone] until 7pm After 7pm, Please call E-link [phone]         [1] No Known Allergies  "

## 2024-07-17 LAB — CBC
HCT: 46.5 % — ABNORMAL HIGH (ref 36.0–46.0)
Hemoglobin: 14 g/dL (ref 12.0–15.0)
MCH: 29.2 pg (ref 26.0–34.0)
MCHC: 30.1 g/dL (ref 30.0–36.0)
MCV: 97.1 fL (ref 80.0–100.0)
Platelets: 171 K/uL (ref 150–400)
RBC: 4.79 MIL/uL (ref 3.87–5.11)
RDW: 16.2 % — ABNORMAL HIGH (ref 11.5–15.5)
WBC: 5.1 K/uL (ref 4.0–10.5)
nRBC: 1.2 % — ABNORMAL HIGH (ref 0.0–0.2)

## 2024-07-17 LAB — GLUCOSE, CAPILLARY
Glucose-Capillary: 101 mg/dL — ABNORMAL HIGH (ref 70–99)
Glucose-Capillary: 74 mg/dL (ref 70–99)
Glucose-Capillary: 81 mg/dL (ref 70–99)
Glucose-Capillary: 127 mg/dL — ABNORMAL HIGH (ref 70–99)

## 2024-07-17 LAB — BASIC METABOLIC PANEL WITH GFR
Anion gap: 9 (ref 5–15)
BUN: 26 mg/dL — ABNORMAL HIGH (ref 6–20)
CO2: 33 mmol/L — ABNORMAL HIGH (ref 22–32)
Calcium: 8.9 mg/dL (ref 8.9–10.3)
Chloride: 93 mmol/L — ABNORMAL LOW (ref 98–111)
Creatinine, Ser: 1.14 mg/dL — ABNORMAL HIGH (ref 0.44–1.00)
GFR, Estimated: 56 mL/min — ABNORMAL LOW
Glucose, Bld: 94 mg/dL (ref 70–99)
Potassium: 4.5 mmol/L (ref 3.5–5.1)
Sodium: 135 mmol/L (ref 135–145)

## 2024-07-17 LAB — MAGNESIUM: Magnesium: 2.2 mg/dL (ref 1.7–2.4)

## 2024-07-17 LAB — PROCALCITONIN: Procalcitonin: <0.1 ng/mL

## 2024-07-17 LAB — PRO BRAIN NATRIURETIC PEPTIDE: Pro Brain Natriuretic Peptide: <50 pg/mL

## 2024-07-17 MED ORDER — FUROSEMIDE 10 MG/ML IJ SOLN
40.0000 mg | Freq: Two times a day (BID) | INTRAMUSCULAR | Status: DC
  Administered 2024-07-17 – 2024-07-19 (×5): 40 mg via INTRAVENOUS
  Filled 2024-07-17 (×5): qty 4

## 2024-07-17 NOTE — Plan of Care (Signed)
" °  Problem: Clinical Measurements: Goal: Ability to maintain clinical measurements within normal limits will improve Outcome: Progressing Goal: Will remain free from infection Outcome: Progressing Goal: Diagnostic test results will improve 07/17/2024 0720 by Tinslee Klare, Elsie BIRCH, RN Outcome: Progressing 07/17/2024 0354 by Imagene Elsie BIRCH, RN Outcome: Progressing Goal: Respiratory complications will improve Outcome: Progressing Goal: Cardiovascular complication will be avoided 07/17/2024 0720 by Imagene Elsie BIRCH, RN Outcome: Progressing 07/17/2024 0354 by Imagene Elsie BIRCH, RN Outcome: Progressing   Problem: Activity: Goal: Risk for activity intolerance will decrease Outcome: Progressing   "

## 2024-07-17 NOTE — Plan of Care (Signed)
" °  Problem: Clinical Measurements: Goal: Ability to maintain clinical measurements within normal limits will improve Outcome: Progressing Goal: Will remain free from infection Outcome: Progressing Goal: Diagnostic test results will improve Outcome: Progressing Goal: Cardiovascular complication will be avoided Outcome: Progressing   Problem: Activity: Goal: Risk for activity intolerance will decrease Outcome: Progressing   Problem: Nutrition: Goal: Adequate nutrition will be maintained Outcome: Progressing   Problem: Coping: Goal: Ability to adjust to condition or change in health will improve Outcome: Progressing   "

## 2024-07-17 NOTE — Progress Notes (Addendum)
 "  NAME:  Angel Brewer, MRN:  [id number], DOB:  Jun 14, 1966, LOS: 2 ADMISSION DATE:  07/15/2024,  CHIEF COMPLAINT: Acute hypoxic hypercapnic respiratory failure.  History of Present Illness:  This is a 59 year old female patient who lives at home independently.  Independent in ADLs at baseline.  She is tracheostomy dependent with chronic hypoxic and hypercarbic respiratory failure in the setting of severe OHS and OSA complicated by type III pH, diastolic dysfunction, and prior decompensated cor pulmonale.  She is nocturnally ventilator dependent.  She presented on 1/2 for planned routine tracheostomy change at the tracheostomy clinic.  Upon arrival reporting she was feeling very poorly, specifically very short of breath, afraid to go to sleep in fear she might not wake up.  She had been seen In the emergency room on the 29th with these complaints, portable chest x-ray showed diffuse pulmonary edema, she was given intravenous furosemide , and instructed to double her home diuretic, later she was discharged to home from the ER.   On further questioning she began to have increased shortness of breath over the last 4 to 5 weeks prior to presentation.  Also had been having difficulty tolerating her nocturnal ventilation and over the last 2 weeks prior to presentation not using it at all.     After leaving the emergency room she continued to take her Lasix  at increased dose recommendation, presented to tracheostomy clinic reporting only marginal improvement but just still not returning to baseline.  Pulse oximetry was placed noting room air pulse oximetry at 59%, she denied fever, significant cough, chest pain, she has not had any sick exposure.  She is a had a hard time sleeping and has not been able to sleep due to shortness of breath.  She was placed on supplemental oxygen  at 4 L which brought her pulse oximetry up to 88% she was subsequently directly admitted to the intensive care unit.  Pertinent   Medical History   Past Medical History:  Diagnosis Date   Chronic diastolic CHF (congestive heart failure) (HCC) 01/05/2022   DVT (deep vein thrombosis) in pregnancy    RLE DVT 02/2016   Dyspnea    Essential hypertension 08/25/2017   Hyperlipidemia 11/21/2021   Morbid obesity (HCC)    Obesity hypoventilation syndrome (HCC) 05/03/2021   PE (pulmonary embolism) 02/29/2016   Sleep apnea    uses a cpap   Tracheostomy present (HCC) 01/05/2022   Size 6 shiley/cuffless  Last changed: 10/9     Discussion  Stable trach.  She does get a [place] more short of breath with exertion.  Although I did not observe it on my exam she does endorse she will occasionally have a rush of air after removing PMV following activity.  This could suggest some mild air trapping from the trach.  Otherwise she is doing fantastic with the trach     Plan  Cont routin     Significant Hospital Events: Including procedures, antibiotic start and stop dates in addition to other pertinent events   07/15/2024 -tracheostomy exchanged. 1/5 trach aspirate growing pseudomonas   Interim History / Subjective:  On the vent overnight, feels improved Getting diuresed with lasix  40mg  bid  1.9L UOP yesterday   Objective    Blood pressure 110/84, pulse (!) 50, temperature (!) 97.3 F (36.3 C), temperature source Axillary, resp. rate 16, height 5' 7 (1.702 m), weight (!) 177.7 kg, last menstrual period 11/29/2015, SpO2 93%.    Vent Mode: PRVC FiO2 (%):  [40 %-70 %]  40 % Set Rate:  [14 bmp] 14 bmp Vt Set:  [490 mL] 490 mL PEEP:  [10 cmH20] 10 cmH20   Intake/Output Summary (Last 24 hours) at 07/17/2024 1606 Last data filed at 07/17/2024 1601 Gross per 24 hour  Intake 510 ml  Output 650 ml  Net -140 ml   Filed Weights   07/15/24 1400 07/17/24 0700  Weight: (!) 180 kg (!) 177.7 kg    General:  obese F sitting up in the chair in NAD  HEENT: MM pink/moist, trach in place on 10L trach collar Neuro: alert and oriented   CV: s1s2  rrr, no m/r/g PULM:  mildly diminished in the bilateral bases  GI: soft, non-tender  Extremities: warm/dry, 1+ edema      Resolved problem list   Assessment and Plan  Acute on chronic hypoxic respiratory failure secondary to acute pulmonary edema from acute on chronic heart failure, and decompensated cor pulmonale in the setting of obesity hypoventilation syndrome and obstructive sleep apnea  Pseudomonas Pneumonia  Plan Trach aspirate growing pseudomonas Echocardiogram -shows EF is 55 to 60% normal LV function. Wean supplemental daytime O2 as able, on RA at home  Continue IV Lasix  at 40 twice daily  Strict intake output Continue nocturnal ventilation    Tracheostomy dependent Plan Tracheostomy changed 1/2 Follow-up in tracheostomy clinic in 4 weeks postdischarge   Acute on chronic diastolic heart failure, with cor pulmonale Plan Avoid hypoxia Diuresis as above  Continue her home Norvasc  Hold Farxiga  for now.   History of PE and DVT Plan Continue apixaban    Hyperlipidemia Plan Continue statin   Glycemic control Plan AC and at bedtime sliding scale Goal glucose 140s to 180  Patient is able to walk independently. Plan to discharge home once we go down FiO2.   Labs   CBC: Recent Labs  Lab 07/10/24 2332 07/11/24 0125 07/15/24 2015 07/16/24 0135 07/17/24 0150  WBC 5.3  --  7.6 6.8 5.1  NEUTROABS 3.6  --  5.7  --   --   HGB 13.2 13.9 15.9* 14.2 14.0  HCT 44.2 41.0 52.9* 47.2* 46.5*  MCV 98.4  --  96.2 95.9 97.1  PLT 185  --  181 194 171    Basic Metabolic Panel: Recent Labs  Lab 07/10/24 2332 07/11/24 0125 07/15/24 1805 07/16/24 0135 07/17/24 0150  NA 139 139 140 138 135  K 4.6 3.9 4.4 4.8 4.5  CL 103  --  95* 95* 93*  CO2 27  --  35* 31 33*  GLUCOSE 89  --  86 85 94  BUN 15  --  17 19 26*  CREATININE 0.91  --  1.25* 1.35* 1.14*  CALCIUM  8.9  --  9.2 8.7* 8.9  MG  --   --  2.1  --  2.2   GFR: Estimated Creatinine Clearance: 91.7 mL/min  (A) (by C-G formula based on SCr of 1.14 mg/dL (H)). Recent Labs  Lab 07/10/24 2332 07/15/24 1636 07/15/24 1805 07/15/24 2015 07/16/24 0135 07/17/24 0150  PROCALCITON  --   --  0.20  --  0.18 <0.10  WBC 5.3  --   --  7.6 6.8 5.1  LATICACIDVEN  --  1.6  --   --   --   --     Liver Function Tests: Recent Labs  Lab 07/15/24 1805  AST 20  ALT <5  ALKPHOS 95  BILITOT 1.5*  PROT 8.7*  ALBUMIN 3.9   No results for input(s): LIPASE, AMYLASE  in the last 168 hours. No results for input(s): AMMONIA in the last 168 hours.  ABG    Component Value Date/Time   PHART 7.399 11/08/2023 0351   PCO2ART 63.2 (H) 11/08/2023 0351   PO2ART 78 (L) 11/08/2023 0351   HCO3 40.9 (H) 07/15/2024 1636   TCO2 32 07/11/2024 0125   ACIDBASEDEF 4.0 (H) 05/01/2021 0158   O2SAT 50.6 07/15/2024 1636     Coagulation Profile: No results for input(s): INR, PROTIME in the last 168 hours.  Cardiac Enzymes: No results for input(s): CKTOTAL, CKMB, CKMBINDEX, TROPONINI in the last 168 hours.  HbA1C: Hgb A1c MFr Bld  Date/Time Value Ref Range Status  07/15/2024 04:36 PM 5.4 4.8 - 5.6 % Final    Comment:    (NOTE) Diagnosis of Diabetes The following HbA1c ranges recommended by the American Diabetes Association (ADA) may be used as an aid in the diagnosis of diabetes mellitus.  Hemoglobin             Suggested A1C NGSP%              Diagnosis  <5.7                   Non Diabetic  5.7-6.4                Pre-Diabetic  >6.4                   Diabetic  <7.0                   Glycemic control for                       adults with diabetes.    10/25/2023 07:29 AM 5.1 4.8 - 5.6 % Final    Comment:    (NOTE) Pre diabetes:          5.7%-6.4%  Diabetes:              >6.4%  Glycemic control for   <7.0% adults with diabetes     CBG: Recent Labs  Lab 07/16/24 1701 07/16/24 2105 07/17/24 0621 07/17/24 1119 07/17/24 1557  GLUCAP 92 108* 101* 74 81    Review of Systems:    12 point review of systems done which is negative for anything else what is mentioned in HPI.  Past Medical History:  She,  has a past medical history of Chronic diastolic CHF (congestive heart failure) (HCC) (01/05/2022), DVT (deep vein thrombosis) in pregnancy, Dyspnea, Essential hypertension (08/25/2017), Hyperlipidemia (11/21/2021), Morbid obesity (HCC), Obesity hypoventilation syndrome (HCC) (05/03/2021), PE (pulmonary embolism) (02/29/2016), Sleep apnea, and Tracheostomy present (HCC) (01/05/2022).   Surgical History:   Past Surgical History:  Procedure Laterality Date   CESAREAN SECTION     x 3   OPEN REDUCTION INTERNAL FIXATION (ORIF) DISTAL RADIAL FRACTURE Right 06/20/2019   Procedure: OPEN REDUCTION INTERNAL FIXATION (ORIF) DISTAL RADIAL FRACTURE;  Surgeon: Sebastian Lenis, MD;  Location: [facility] OR;  Service: Orthopedics;  Laterality: Right;  REGIONAL BLOCK TOTAL SURGERY TIME: 90 MINUTES   SYNOVECTOMY Right 11/21/2019   Procedure: RIGHT WRIST LIGAMENT RECONSTRUCTION;  Surgeon: Sebastian Lenis, MD;  Location: [facility] OR;  Service: Orthopedics;  Laterality: Right;  PROCEDURE: RIGHT WRIST LIGAMENT RECONSTRUCTION LENGTH OF SURGERY: 105 MIN     Social History:   reports that she has quit smoking. She has never used smokeless tobacco. She reports that she does not drink alcohol and does not use drugs.  Family History:  Her family history includes Hypertension in her sister. There is no history of Breast cancer or BRCA 1/2.   Allergies Allergies[1]   Home Medications  Prior to Admission medications  Medication Sig Start Date End Date Taking? Authorizing Provider  amLODipine  (NORVASC ) 5 MG tablet Take 1 tablet (5 mg total) by mouth daily. 01/19/24  Yes Theotis Haze ORN, NP  apixaban  (ELIQUIS ) 5 MG TABS tablet Take 1 tablet (5 mg total) by mouth 2 (two) times daily. 06/17/23  Yes Theotis Haze ORN, NP  atorvastatin  (LIPITOR) 20 MG tablet Take 1 tablet (20 mg total) by mouth daily. 01/19/24  Yes  Theotis Haze ORN, NP  dapagliflozin  propanediol (FARXIGA ) 10 MG TABS tablet Take 1 tablet (10 mg total) by mouth daily. 01/19/24  Yes Theotis Haze ORN, NP  furosemide  (LASIX ) 20 MG tablet Take 2 tablets (40 mg total) by mouth daily. 07/11/24  Yes Horton, Charmaine FALCON, MD  spironolactone  (ALDACTONE ) 25 MG tablet Take 1 tablet (25 mg total) by mouth daily. 01/19/24  Yes Theotis Haze ORN, NP  UNABLE TO FIND 1 Device by Does not apply route at bedtime. CPAP, every night.   Yes [provider]     Critical care time: n/a     Leita SAUNDERS Gleason, PA-C [facility] See Amion for pager If no response to pager , please call [phone] until 7pm After 7:00 pm call Elink  [phone]           [1] No Known Allergies  "

## 2024-07-18 ENCOUNTER — Inpatient Hospital Stay (HOSPITAL_COMMUNITY)

## 2024-07-18 DIAGNOSIS — J151 Pneumonia due to Pseudomonas: Secondary | ICD-10-CM

## 2024-07-18 LAB — MAGNESIUM: Magnesium: 2.1 mg/dL (ref 1.7–2.4)

## 2024-07-18 LAB — BASIC METABOLIC PANEL WITH GFR
Anion gap: 10 (ref 5–15)
BUN: 25 mg/dL — ABNORMAL HIGH (ref 6–20)
CO2: 33 mmol/L — ABNORMAL HIGH (ref 22–32)
Calcium: 9.2 mg/dL (ref 8.9–10.3)
Chloride: 91 mmol/L — ABNORMAL LOW (ref 98–111)
Creatinine, Ser: 1.06 mg/dL — ABNORMAL HIGH (ref 0.44–1.00)
GFR, Estimated: >60 mL/min
Glucose, Bld: 104 mg/dL — ABNORMAL HIGH (ref 70–99)
Potassium: 4.1 mmol/L (ref 3.5–5.1)
Sodium: 134 mmol/L — ABNORMAL LOW (ref 135–145)

## 2024-07-18 LAB — CBC
HCT: 47.7 % — ABNORMAL HIGH (ref 36.0–46.0)
Hemoglobin: 14.4 g/dL (ref 12.0–15.0)
MCH: 29 pg (ref 26.0–34.0)
MCHC: 30.2 g/dL (ref 30.0–36.0)
MCV: 96 fL (ref 80.0–100.0)
Platelets: 169 K/uL (ref 150–400)
RBC: 4.97 MIL/uL (ref 3.87–5.11)
RDW: 16 % — ABNORMAL HIGH (ref 11.5–15.5)
WBC: 4.7 K/uL (ref 4.0–10.5)
nRBC: 0 % (ref 0.0–0.2)

## 2024-07-18 LAB — GLUCOSE, CAPILLARY
Glucose-Capillary: 103 mg/dL — ABNORMAL HIGH (ref 70–99)
Glucose-Capillary: 87 mg/dL (ref 70–99)
Glucose-Capillary: 121 mg/dL — ABNORMAL HIGH (ref 70–99)
Glucose-Capillary: 104 mg/dL — ABNORMAL HIGH (ref 70–99)

## 2024-07-18 LAB — PRO BRAIN NATRIURETIC PEPTIDE: Pro Brain Natriuretic Peptide: <50 pg/mL

## 2024-07-18 LAB — PROCALCITONIN: Procalcitonin: <0.1 ng/mL

## 2024-07-18 MED ORDER — PIPERACILLIN-TAZOBACTAM 3.375 G IVPB
3.3750 g | Freq: Three times a day (TID) | INTRAVENOUS | Status: DC
  Administered 2024-07-18 – 2024-07-19 (×4): 3.375 g via INTRAVENOUS
  Filled 2024-07-18 (×4): qty 50

## 2024-07-18 NOTE — Evaluation (Signed)
 Occupational Therapy Evaluation Patient Details Name: Angel Brewer MRN: [id number] DOB: September 23, 1965 Today's Date: 07/18/2024   History of Present Illness   The pt is a 59 y.o. F s/p Tracheostomy change 1/2. Admitted to the ICU with Acute on chronic diastolic heart failure with cor pulmonale, SOB with SpO2 59% on RA. PMH CHF, HTN, superior endplate deformity of L1, R clavicular head fx, L displaced tibial plateau intercondylar fx.fx of L proximal humerus, chronic hypoxic hypercarbic respiratory failure tracheostomy dependent since May 2023, HFpEF, OSA, PE and DVT on Eliquis , and morbid obesity class III, nocturnally ventilator dependent,sleep apnea     Clinical Impressions PTA pt lives alone independently and manages her own vent. Pt able to ambulate and complete ADL tasks independently using adaptive techniques due to increased body habitus. Able to ambulate @ 370 ft with 2 standing rest breaks with SpO2 desat @ 82 on 40%FiO2, 6L with 2/4 DOE. REbounds quickly with rest into the low 90s and returns to 97 seated. Educated on use of incentive spirometer. Encourage pt to walk with staff and mobility specialist once transferred to floor. No further acute OT needs.      If plan is discharge home, recommend the following:         Functional Status Assessment   Patient has not had a recent decline in their functional status     Equipment Recommendations   None recommended by OT     Recommendations for Other Services         Precautions/Restrictions   Precautions Precautions: Other (comment) (watch O2)     Mobility Bed Mobility               General bed mobility comments: OOB in chair    Transfers Overall transfer level: Independent                        Balance Overall balance assessment: No apparent balance deficits (not formally assessed)                                         ADL either performed or assessed with  clinical judgement   ADL Overall ADL's : At baseline                                       General ADL Comments: educated on energy conservation strategies (Pt has adapted techniques she uses to complete ADL tasks)     Vision         Perception         Praxis         Pertinent Vitals/Pain Pain Assessment Pain Assessment: No/denies pain     Extremity/Trunk Assessment Upper Extremity Assessment Upper Extremity Assessment: Overall WFL for tasks assessed   Lower Extremity Assessment Lower Extremity Assessment: Defer to PT evaluation   Cervical / Trunk Assessment Cervical / Trunk Assessment: Other exceptions (increased body habitus)   Communication Communication Communication: No apparent difficulties (uses PMSV)   Cognition Arousal: Alert Behavior During Therapy: WFL for tasks assessed/performed Cognition: No apparent impairments                                       Cueing  General Comments          Exercises Exercises: Other exercises Other Exercises Other Exercises: incentive spirometer - 10  - only able to pull @ 300 ml   Shoulder Instructions      Home Living Family/patient expects to be discharged to:: Private residence Living Arrangements: Alone Available Help at Discharge: Family;Available PRN/intermittently Type of Home: House Home Access: Stairs to enter [employer] of Steps: 1 Entrance Stairs-Rails: None Home Layout: One level     Bathroom Shower/Tub: Producer, Television/film/video: Handicapped height Bathroom Accessibility: No   Home Equipment: Agricultural Consultant (2 wheels);BSC/3in1   Additional Comments: walker does not fit in bathroom, shower seat doesnt fit in shower      Prior Functioning/Environment Prior Level of Function : Independent/Modified Independent;Driving             Mobility Comments: no use of DME, no other falls      OT Problem List:     OT  Treatment/Interventions:        OT Goals(Current goals can be found in the care plan section)   Acute Rehab OT Goals Patient Stated Goal: home today OT Goal Formulation: All assessment and education complete, DC therapy   OT Frequency:       Co-evaluation PT/OT/SLP Co-Evaluation/Treatment: Yes Reason for Co-Treatment: For patient/therapist safety   OT goals addressed during session: ADL's and self-care      AM-PAC OT 6 Clicks Daily Activity     Outcome Measure Help from another person eating meals?: None Help from another person taking care of personal grooming?: None Help from another person toileting, which includes using toliet, bedpan, or urinal?: None Help from another person bathing (including washing, rinsing, drying)?: None Help from another person to put on and taking off regular upper body clothing?: None Help from another person to put on and taking off regular lower body clothing?: None 6 Click Score: 24   End of Session Equipment Utilized During Treatment: Oxygen  (6L; 40%) Nurse Communication: Other (comment);Mobility status (SpO2)  Activity Tolerance: Patient tolerated treatment well Patient left: in chair;with call bell/phone within reach                   Time: 8850-8781 OT Time Calculation (min): 29 min Charges:  OT General Charges $OT Visit: 1 Visit OT Evaluation $OT Eval Moderate Complexity: 1 Mod  Dema Timmons, OT/L   Acute OT Clinical Specialist Acute Rehabilitation Services Pager [phone] Office [phone]   [facility] 07/18/2024, 1:36 PM

## 2024-07-18 NOTE — Evaluation (Signed)
 Physical Therapy Evaluation Patient Details Name: Angel Brewer MRN: [id number] DOB: June 06, 1966 Today's Date: 07/18/2024  History of Present Illness  The pt is a 59 y.o. F s/p Tracheostomy change 1/2. Admitted to the ICU with Acute on chronic diastolic heart failure with cor pulmonale, SOB with SpO2 59% on RA. PMH CHF, HTN, superior endplate deformity of L1, R clavicular head fx, L displaced tibial plateau intercondylar fx.fx of L proximal humerus, chronic hypoxic hypercarbic respiratory failure tracheostomy dependent since May 2023, HFpEF, OSA, PE and DVT on Eliquis , and morbid obesity class III, nocturnally ventilator dependent,sleep apnea.   Clinical Impression  Pt in bed upon arrival of PT, agreeable to evaluation at this time. Prior to admission the pt was ambulating without need for O2 or DME, but reports recent decline in activity tolerance and increased need for standing rest breaks. The pt was able to complete transfers without assistance, but did have significant drops in SpO2 with ambulation, despite 6L O2 with FiO2 40%. The pt recovered to 90s with standing rest, was able to complete 3 x ~155ft bouts of ambulation and reports only minimal SOB. However, SpO2 consistently to low of 82%, needing 1-3 min standing rest and focus on breathing to improve to 90%. Will continue to follow acutely for endurance training and progression, do not anticipate further PT needs after d/c.      If plan is discharge home, recommend the following: Help with stairs or ramp for entrance   Can travel by private vehicle        Equipment Recommendations None recommended by PT  Recommendations for Other Services       Functional Status Assessment Patient has had a recent decline in their functional status and demonstrates the ability to make significant improvements in function in a reasonable and predictable amount of time.     Precautions / Restrictions Precautions Precautions: Other  (comment) Recall of Precautions/Restrictions: Intact Precaution/Restrictions Comments: watch O2, on trach collar Restrictions Weight Bearing Restrictions Per Provider Order: No      Mobility  Bed Mobility               General bed mobility comments: OOB in chair    Transfers Overall transfer level: Independent Equipment used: None               General transfer comment: no assist, supervision in session but good stability    Ambulation/Gait Ambulation/Gait assistance: Contact guard assist, Supervision Gait Distance (Feet): 100 Feet (+ 125 ft + 3ft) Assistive device: None Gait Pattern/deviations: Step-through pattern Gait velocity: decreased     General Gait Details: slow but stable gait, increased fatigue and SOB after ~100 ft. SpO2 to low of 82% on 6L trach collar with 40% FiO2, 93% on 10L FiO2 40% at rest     Balance Overall balance assessment: No apparent balance deficits (not formally assessed)                                           Pertinent Vitals/Pain Pain Assessment Pain Assessment: No/denies pain    Home Living Family/patient expects to be discharged to:: Private residence Living Arrangements: Alone Available Help at Discharge: Family;Available PRN/intermittently Type of Home: House Home Access: Stairs to enter Entrance Stairs-Rails: None Entrance Stairs-Number of Steps: 1   Home Layout: One level Home Equipment: Agricultural Consultant (2 wheels);BSC/3in1 Additional Comments: walker does not fit in  bathroom, shower seat doesnt fit in shower    Prior Function Prior Level of Function : Independent/Modified Independent;Driving             Mobility Comments: no use of DME, no other falls       Extremity/Trunk Assessment   Upper Extremity Assessment Upper Extremity Assessment: Defer to OT evaluation;Overall WFL for tasks assessed    Lower Extremity Assessment Lower Extremity Assessment: Overall WFL for tasks  assessed    Cervical / Trunk Assessment Cervical / Trunk Assessment: Other exceptions Cervical / Trunk Exceptions: increased body habitus  Communication   Communication Communication: No apparent difficulties (uses PMSV) Factors Affecting Communication: [name] valve    Cognition Arousal: Alert Behavior During Therapy: WFL for tasks assessed/performed   PT - Cognitive impairments: No apparent impairments                       PT - Cognition Comments: WFL for session Following commands: Intact       Cueing Cueing Techniques: Verbal cues     General Comments General comments (skin integrity, edema, etc.): SpO2 to low of 82% on 6L trach collar with 40% FiO2, 93% on 10L FiO2 40% at rest    Exercises Other Exercises Other Exercises: incentive spirometer - 10  - only able to pull @ 300 ml   Assessment/Plan    PT Assessment Patient needs continued PT services  PT Problem List Cardiopulmonary status limiting activity;Decreased activity tolerance       PT Treatment Interventions Gait training;Stair training;Therapeutic exercise    PT Goals (Current goals can be found in the Care Plan section)  Acute Rehab PT Goals Patient Stated Goal: improve endurance PT Goal Formulation: With patient Time For Goal Achievement: 08/01/24 Potential to Achieve Goals: Good    Frequency Min 1X/week     Co-evaluation   Reason for Co-Treatment: For patient/therapist safety   OT goals addressed during session: ADL's and self-care       AM-PAC PT 6 Clicks Mobility  Outcome Measure Help needed turning from your back to your side while in a flat bed without using bedrails?: None Help needed moving from lying on your back to sitting on the side of a flat bed without using bedrails?: None Help needed moving to and from a bed to a chair (including a wheelchair)?: None Help needed standing up from a chair using your arms (e.g., wheelchair or bedside chair)?: None Help  needed to walk in hospital room?: A [place]r Help needed climbing 3-5 steps with a railing? : A [place] 6 Click Score: 22    End of Session Equipment Utilized During Treatment: Oxygen  Activity Tolerance: Patient tolerated treatment well Patient left: in chair;with call bell/phone within reach Nurse Communication: Mobility status PT Visit Diagnosis: Difficulty in walking, not elsewhere classified (R26.2)    Time: 8849-8782 PT Time Calculation (min) (ACUTE ONLY): 27 min   Charges:   PT Evaluation $PT Eval Low Complexity: 1 Low   PT General Charges $$ ACUTE PT VISIT: 1 Visit         Izetta Call, PT, DPT   Acute Rehabilitation Department Office [phone] Secure Chat Communication Preferred   Izetta JULIANNA Call 07/18/2024, 3:53 PM

## 2024-07-19 ENCOUNTER — Other Ambulatory Visit: Payer: Self-pay

## 2024-07-19 ENCOUNTER — Encounter (HOSPITAL_COMMUNITY): Payer: Self-pay | Admitting: Critical Care Medicine

## 2024-07-19 DIAGNOSIS — J9611 Chronic respiratory failure with hypoxia: Secondary | ICD-10-CM

## 2024-07-19 DIAGNOSIS — J0410 Acute tracheitis without obstruction: Secondary | ICD-10-CM

## 2024-07-19 LAB — BASIC METABOLIC PANEL WITH GFR
Anion gap: 10 (ref 5–15)
BUN: 33 mg/dL — ABNORMAL HIGH (ref 6–20)
CO2: 37 mmol/L — ABNORMAL HIGH (ref 22–32)
Calcium: 9.3 mg/dL (ref 8.9–10.3)
Chloride: 89 mmol/L — ABNORMAL LOW (ref 98–111)
Creatinine, Ser: 1.25 mg/dL — ABNORMAL HIGH (ref 0.44–1.00)
GFR, Estimated: 50 mL/min — ABNORMAL LOW
Glucose, Bld: 99 mg/dL (ref 70–99)
Potassium: 3.8 mmol/L (ref 3.5–5.1)
Sodium: 136 mmol/L (ref 135–145)

## 2024-07-19 LAB — CULTURE, RESPIRATORY W GRAM STAIN: Gram Stain: NONE SEEN

## 2024-07-19 LAB — CBC
HCT: 46 % (ref 36.0–46.0)
Hemoglobin: 14.3 g/dL (ref 12.0–15.0)
MCH: 28.7 pg (ref 26.0–34.0)
MCHC: 31.1 g/dL (ref 30.0–36.0)
MCV: 92.4 fL (ref 80.0–100.0)
Platelets: 196 K/uL (ref 150–400)
RBC: 4.98 MIL/uL (ref 3.87–5.11)
RDW: 16 % — ABNORMAL HIGH (ref 11.5–15.5)
WBC: 5.2 K/uL (ref 4.0–10.5)
nRBC: 0 % (ref 0.0–0.2)

## 2024-07-19 LAB — MAGNESIUM: Magnesium: 2.2 mg/dL (ref 1.7–2.4)

## 2024-07-19 LAB — GLUCOSE, CAPILLARY
Glucose-Capillary: 106 mg/dL — ABNORMAL HIGH (ref 70–99)
Glucose-Capillary: 132 mg/dL — ABNORMAL HIGH (ref 70–99)
Glucose-Capillary: 120 mg/dL — ABNORMAL HIGH (ref 70–99)
Glucose-Capillary: 114 mg/dL — ABNORMAL HIGH (ref 70–99)

## 2024-07-19 LAB — PRO BRAIN NATRIURETIC PEPTIDE: Pro Brain Natriuretic Peptide: <50 pg/mL

## 2024-07-19 MED ORDER — ORAL CARE MOUTH RINSE: 15.0000 mL | OROMUCOSAL | Status: DC | PRN

## 2024-07-19 MED ORDER — FUROSEMIDE 40 MG PO TABS
40.0000 mg | ORAL_TABLET | Freq: Two times a day (BID) | ORAL | Status: DC
  Administered 2024-07-19 – 2024-07-20 (×2): 40 mg via ORAL
  Filled 2024-07-19 (×2): qty 1

## 2024-07-19 MED ORDER — ORAL CARE MOUTH RINSE
15.0000 mL | OROMUCOSAL | Status: DC
  Administered 2024-07-19: 15 mL via OROMUCOSAL

## 2024-07-19 MED ORDER — LEVOFLOXACIN 750 MG PO TABS
750.0000 mg | ORAL_TABLET | Freq: Every day | ORAL | Status: DC
  Administered 2024-07-19 – 2024-07-20 (×2): 750 mg via ORAL
  Filled 2024-07-19 (×2): qty 1

## 2024-07-19 NOTE — Progress Notes (Signed)
 "  NAME:  Angel Brewer, MRN:  [id number], DOB:  [birth year], LOS: 4 ADMISSION DATE:  07/15/2024,  CHIEF COMPLAINT: Acute hypoxic hypercapnic respiratory failure.  History of Present Illness:  This is a 59 year old female patient who lives at home independently.  Independent in ADLs at baseline.  She is tracheostomy dependent with chronic hypoxic and hypercarbic respiratory failure in the setting of severe OHS and OSA complicated by type III pH, diastolic dysfunction, and prior decompensated cor pulmonale.  She is nocturnally ventilator dependent.  She presented on 1/2 for planned routine tracheostomy change at the tracheostomy clinic.  Upon arrival reporting she was feeling very poorly, specifically very short of breath, afraid to go to sleep in fear she might not wake up.  She had been seen In the emergency room on the 29th with these complaints, portable chest x-ray showed diffuse pulmonary edema, she was given intravenous furosemide , and instructed to double her home diuretic, later she was discharged to home from the ER.   On further questioning she began to have increased shortness of breath over the last 4 to 5 weeks prior to presentation.  Also had been having difficulty tolerating her nocturnal ventilation and over the last 2 weeks prior to presentation not using it at all.     After leaving the emergency room she continued to take her Lasix  at increased dose recommendation, presented to tracheostomy clinic reporting only marginal improvement but just still not returning to baseline.  Pulse oximetry was placed noting room air pulse oximetry at 59%, she denied fever, significant cough, chest pain, she has not had any sick exposure.  She is a had a hard time sleeping and has not been able to sleep due to shortness of breath.  She was placed on supplemental oxygen  at 4 L which brought her pulse oximetry up to 88% she was subsequently directly admitted to the intensive care unit.  Pertinent   Medical History   Past Medical History:  Diagnosis Date   Chronic diastolic CHF (congestive heart failure) (HCC) 01/05/2022   DVT (deep vein thrombosis) in pregnancy    RLE DVT 02/2016   Dyspnea    Essential hypertension 08/25/2017   Hyperlipidemia 11/21/2021   Morbid obesity (HCC)    Obesity hypoventilation syndrome (HCC) 05/03/2021   PE (pulmonary embolism) 02/29/2016   Sleep apnea    uses a cpap   Tracheostomy present (HCC) 01/05/2022   Size 6 shiley/cuffless  Last changed: 10/9     Discussion  Stable trach.  She does get a Henigan more short of breath with exertion.  Although I did not observe it on my exam she does endorse she will occasionally have a rush of air after removing PMV following activity.  This could suggest some mild air trapping from the trach.  Otherwise she is doing fantastic with the trach     Plan  Cont routin     Significant Hospital Events: Including procedures, antibiotic start and stop dates in addition to other pertinent events   07/15/2024 -tracheostomy exchanged. 1/5 trach aspirate growing pseudomonas  1/6 no overnight events   Interim History / Subjective:  No acute overnight events, back on 12L today 1300cc UOP yesterday, net neg 4.4L   Objective    Blood pressure (!) 125/59, pulse 60, temperature (!) 97.5 F (36.4 C), temperature source Oral, resp. rate 18, height 5' 7 (1.702 m), weight (!) 178.8 kg, last menstrual period 11/29/2015, SpO2 96%.    Vent Mode: PRVC FiO2 (%):  [  40 %-50 %] 50 % Set Rate:  [14 bmp] 14 bmp Vt Set:  [490 mL] 490 mL PEEP:  [5 cmH20] 5 cmH20   Intake/Output Summary (Last 24 hours) at 07/19/2024 0949 Last data filed at 07/19/2024 0900 Gross per 24 hour  Intake 574.68 ml  Output 1300 ml  Net -725.32 ml   Filed Weights   07/15/24 1400 07/17/24 0700 07/18/24 0630  Weight: (!) 180 kg (!) 177.7 kg (!) 178.8 kg    General:  obese F resting on bed connected to vent  HEENT: MM pink/moist, trach in place on 12L trach  collar Neuro: alert and oriented   CV: s1s2 rrr, no m/r/g PULM:  mildly diminished in the bilateral bases  GI: soft, non-tender  Extremities: warm/dry, 1+ edema      Resolved problem list   Assessment and Plan  Acute on chronic hypoxic respiratory failure secondary to acute pulmonary edema from acute on chronic heart failure, and decompensated cor pulmonale in the setting of obesity hypoventilation syndrome and obstructive sleep apnea  Pseudomonas and Proteus Pneumonia  Plan Trach aspirate growing Pseudomonas and Proteus  Echocardiogram -shows EF is 55 to 60% normal LV function. Wean supplemental daytime O2 as able, on RA at home, may need daytime home O2, will contact case manager  Change lasix  40mg  bid from IV to po  Strict intake output Continue nocturnal ventilation    Tracheostomy dependent Plan Tracheostomy changed 1/2 Follow-up in tracheostomy clinic in 4 weeks postdischarge   Acute on chronic diastolic heart failure, with cor pulmonale Plan Avoid hypoxia Diuresis as above  Continue her home Norvasc  Hold Farxiga  for now.   History of PE and DVT Plan Continue apixaban    Hyperlipidemia Plan Continue statin   Glycemic control Plan AC and at bedtime sliding scale Goal glucose 140s to 180  Patient is able to walk independently. Plan to discharge home once we go down FiO2 or set up home O2   Labs   CBC: Recent Labs  Lab 07/15/24 2015 07/16/24 0135 07/17/24 0150 07/18/24 0145 07/19/24 0222  WBC 7.6 6.8 5.1 4.7 5.2  NEUTROABS 5.7  --   --   --   --   HGB 15.9* 14.2 14.0 14.4 14.3  HCT 52.9* 47.2* 46.5* 47.7* 46.0  MCV 96.2 95.9 97.1 96.0 92.4  PLT 181 194 171 169 196    Basic Metabolic Panel: Recent Labs  Lab 07/15/24 1805 07/16/24 0135 07/17/24 0150 07/18/24 0145 07/19/24 0222  NA 140 138 135 134* 136  K 4.4 4.8 4.5 4.1 3.8  CL 95* 95* 93* 91* 89*  CO2 35* 31 33* 33* 37*  GLUCOSE 86 85 94 104* 99  BUN 17 19 26* 25* 33*  CREATININE  1.25* 1.35* 1.14* 1.06* 1.25*  CALCIUM  9.2 8.7* 8.9 9.2 9.3  MG 2.1  --  2.2 2.1 2.2   GFR: Estimated Creatinine Clearance: 84 mL/min (A) (by C-G formula based on SCr of 1.25 mg/dL (H)). Recent Labs  Lab 07/15/24 1636 07/15/24 1805 07/15/24 2015 07/16/24 0135 07/17/24 0150 07/18/24 0145 07/19/24 0222  PROCALCITON  --  0.20  --  0.18 <0.10 <0.10  --   WBC  --   --    < > 6.8 5.1 4.7 5.2  LATICACIDVEN 1.6  --   --   --   --   --   --    < > = values in this interval not displayed.    Liver Function Tests: Recent Labs  Lab  07/15/24 1805  AST 20  ALT <5  ALKPHOS 95  BILITOT 1.5*  PROT 8.7*  ALBUMIN 3.9   No results for input(s): LIPASE, AMYLASE in the last 168 hours. No results for input(s): AMMONIA in the last 168 hours.  ABG    Component Value Date/Time   PHART 7.399 11/08/2023 0351   PCO2ART 63.2 (H) 11/08/2023 0351   PO2ART 78 (L) 11/08/2023 0351   HCO3 40.9 (H) 07/15/2024 1636   TCO2 32 07/11/2024 0125   ACIDBASEDEF 4.0 (H) 05/01/2021 0158   O2SAT 50.6 07/15/2024 1636     Coagulation Profile: No results for input(s): INR, PROTIME in the last 168 hours.  Cardiac Enzymes: No results for input(s): CKTOTAL, CKMB, CKMBINDEX, TROPONINI in the last 168 hours.  HbA1C: Hgb A1c MFr Bld  Date/Time Value Ref Range Status  07/15/2024 04:36 PM 5.4 4.8 - 5.6 % Final    Comment:    (NOTE) Diagnosis of Diabetes The following HbA1c ranges recommended by the American Diabetes Association (ADA) may be used as an aid in the diagnosis of diabetes mellitus.  Hemoglobin             Suggested A1C NGSP%              Diagnosis  <5.7                   Non Diabetic  5.7-6.4                Pre-Diabetic  >6.4                   Diabetic  <7.0                   Glycemic control for                       adults with diabetes.    10/25/2023 07:29 AM 5.1 4.8 - 5.6 % Final    Comment:    (NOTE) Pre diabetes:          5.7%-6.4%  Diabetes:               >6.4%  Glycemic control for   <7.0% adults with diabetes     CBG: Recent Labs  Lab 07/18/24 0757 07/18/24 1106 07/18/24 1608 07/18/24 2103 07/19/24 0633  GLUCAP 103* 87 121* 104* 106*    Review of Systems:   12 point review of systems done which is negative for anything else what is mentioned in HPI.  Past Medical History:  She,  has a past medical history of Chronic diastolic CHF (congestive heart failure) (HCC) (01/05/2022), DVT (deep vein thrombosis) in pregnancy, Dyspnea, Essential hypertension (08/25/2017), Hyperlipidemia (11/21/2021), Morbid obesity (HCC), Obesity hypoventilation syndrome (HCC) (05/03/2021), PE (pulmonary embolism) (02/29/2016), Sleep apnea, and Tracheostomy present (HCC) (01/05/2022).   Surgical History:   Past Surgical History:  Procedure Laterality Date   CESAREAN SECTION     x 3   OPEN REDUCTION INTERNAL FIXATION (ORIF) DISTAL RADIAL FRACTURE Right 06/20/2019   Procedure: OPEN REDUCTION INTERNAL FIXATION (ORIF) DISTAL RADIAL FRACTURE;  Surgeon: Sebastian Lenis, MD;  Location: [facility] OR;  Service: Orthopedics;  Laterality: Right;  REGIONAL BLOCK TOTAL SURGERY TIME: 90 MINUTES   SYNOVECTOMY Right 11/21/2019   Procedure: RIGHT WRIST LIGAMENT RECONSTRUCTION;  Surgeon: Sebastian Lenis, MD;  Location: [facility];  Service: Orthopedics;  Laterality: Right;  PROCEDURE: RIGHT WRIST LIGAMENT RECONSTRUCTION LENGTH OF SURGERY: 105 MIN     Social  History:   reports that she has quit smoking. She has never used smokeless tobacco. She reports that she does not drink alcohol and does not use drugs.   Family History:  Her family history includes Hypertension in her sister. There is no history of Breast cancer or BRCA 1/2.   Allergies Allergies[1]   Home Medications  Prior to Admission medications  Medication Sig Start Date End Date Taking? Authorizing Provider  amLODipine  (NORVASC ) 5 MG tablet Take 1 tablet (5 mg total) by mouth daily. 01/19/24  Yes Theotis Haze ORN, NP   apixaban  (ELIQUIS ) 5 MG TABS tablet Take 1 tablet (5 mg total) by mouth 2 (two) times daily. 06/17/23  Yes Theotis Haze ORN, NP  atorvastatin  (LIPITOR) 20 MG tablet Take 1 tablet (20 mg total) by mouth daily. 01/19/24  Yes Fleming, Zelda W, NP  dapagliflozin  propanediol (FARXIGA ) 10 MG TABS tablet Take 1 tablet (10 mg total) by mouth daily. 01/19/24  Yes Theotis Haze ORN, NP  furosemide  (LASIX ) 20 MG tablet Take 2 tablets (40 mg total) by mouth daily. 07/11/24  Yes Horton, Charmaine FALCON, MD  spironolactone  (ALDACTONE ) 25 MG tablet Take 1 tablet (25 mg total) by mouth daily. 01/19/24  Yes Theotis Haze ORN, NP  UNABLE TO FIND 1 Device by Does not apply route at bedtime. CPAP, every night.   Yes [provider]     Critical care time: n/a     Leita SAUNDERS Merdith Adan, PA-C [facility] See Amion for pager If no response to pager , please call [phone] until 7pm After 7:00 pm call Elink  [phone]            [1]  Allergies Allergen Reactions   Chlorhexidine  Gluconate Itching and Rash   "

## 2024-07-19 NOTE — TOC Initial Note (Addendum)
 Transition of Care [facility]) - Initial/Assessment Note    Patient Details  Name: Angel Brewer MRN: [id number] Date of Birth: 04/22/1966  Transition of [facility]) CM/SW Contact:    Marval Gell, RN Phone Number: 07/19/2024, 3:05 PM  Clinical Narrative:                  Independent in ADLs at baseline. She is tracheostomy dependent with chronic hypoxic and hypercarbic respiratory failure in the setting of severe OHS and OSA complicated by type III pH, diastolic dysfunction, and prior decompensated cor pulmonale. She is nocturnally ventilator dependent.   Notified by provider that patient will need oxygen  for during the day.  Spoke w patient and she states that her home O2/ DME provider is [facility]. Texted and LVM with Zach from [facility] requesting callback.   Per Zach with [employer] they supply vent/ needs but do not supply oxygen . Searched chart, [employer] may be home oxygen  supplier, reached out to [employer], waiting to hear back.   Patient's home O2 supplier is [employer]. Zach w [employer] notified that she will need daytime O2, requested DME O2 order from provider.   Expected Discharge Plan: Home/Self Care Barriers to Discharge: Continued Medical Work up   Patient Goals and CMS Choice Patient states their goals for this hospitalization and ongoing recovery are:: to return home CMS Medicare.gov Compare Post Acute Care list provided to:: Patient        Expected Discharge Plan and Services   Discharge Planning Services: CM Consult Post Acute Care Choice: Durable Medical Equipment Living arrangements for the past 2 months: Single Family Home                 DME Arranged: Oxygen  DME Agency: Other - Comment Date DME Agency Contacted: 07/19/24 Time DME Agency Contacted: [phone] Representative spoke with at DME Agency: Zack with [employer]            Prior Living Arrangements/Services Living arrangements for the past 2 months: Single Family Home                      Activities of Daily Living   ADL Screening (condition at time of admission) Independently performs ADLs?: Yes (appropriate for developmental age) Is the patient deaf or have difficulty hearing?: No Does the patient have difficulty seeing, even when wearing glasses/contacts?: No Does the patient have difficulty concentrating, remembering, or making decisions?: No  Permission Sought/Granted                  Emotional Assessment              Admission diagnosis:  Acute on chronic hypoxic respiratory failure (HCC) [J96.21] Patient Active Problem List   Diagnosis Date Noted   Pneumonia of both lungs due to Pseudomonas species (HCC) 07/18/2024   Acute on chronic hypoxic respiratory failure (HCC) 07/15/2024   Ventilator dependence (HCC) 03/10/2024   History of CHF (congestive heart failure) 10/23/2023   OSA on CPAP 10/23/2023   History of DVT (deep vein thrombosis) 10/23/2023   Multiple fracture 10/23/2023   Fall at home, initial encounter 10/23/2023   Acute respiratory failure with hypoxia and hypercarbia (HCC) 10/23/2023   Tracheostomy dependence (HCC) 10/23/2023   Fall 10/23/2023   COPD exacerbation (HCC) 09/03/2023   Acute respiratory failure (HCC) 09/02/2023   CAP (community acquired pneumonia) 06/16/2022   Respiratory failure (HCC) 01/08/2022   Acute diastolic heart failure (HCC) 01/05/2022   Tracheostomy dependent (HCC) 01/05/2022   Acute  metabolic encephalopathy 11/23/2021   History of pulmonary embolism 11/21/2021   Hyperlipidemia 11/21/2021   COPD (chronic obstructive pulmonary disease) (HCC) 05/08/2021   Obesity hypoventilation syndrome (HCC) 05/03/2021   OSA (obstructive sleep apnea) 08/18/2020   Chronic deep vein thrombosis (DVT) of femoral vein of left lower extremity (HCC) 02/09/2020   Chronic anticoagulation 02/09/2020   Acute hypoxemic respiratory failure (HCC) 11/22/2019   Right wrist fracture 06/14/2019   Chronic hypoxic respiratory failure (HCC)  06/13/2019   Benign essential HTN 08/25/2017   Class 3 severe obesity with serious comorbidity and body mass index (BMI) of 60.0 to 69.9 in adult [facility]) 10/29/2016   Vitamin D  deficiency 10/29/2016   PCP:  Theotis Haze ORN, NP Pharmacy:   [id number] [address] [place] - [address] [place] [address] Phone: [phone] Fax: [phone]  [facility] [address] [place] [address] Phone: [phone] Fax: [phone]     Social Drivers of Health (SDOH) Social History: SDOH Screenings   Food Insecurity: No Food Insecurity (07/19/2024)  Housing: Low Risk (07/19/2024)  Transportation Needs: No Transportation Needs (07/19/2024)  Utilities: Not At Risk (07/19/2024)  Depression (PHQ2-9): Low Risk (06/17/2023)  Financial Resource Strain: Low Risk (01/19/2024)  Physical Activity: Insufficiently Active (01/19/2024)  Social Connections: Socially Isolated (01/19/2024)  Stress: No Stress Concern Present (01/19/2024)  Tobacco Use: Medium Risk (07/19/2024)  Health Literacy: Adequate Health Literacy (06/17/2023)   SDOH Interventions:     Readmission Risk Interventions    09/03/2023    1:17 PM 12/20/2021   11:33 AM  Readmission Risk Prevention Plan  Post Dischage Appt Complete   Medication Screening Complete   Transportation Screening Complete Complete  PCP or Specialist Appt within 3-5 Days  Complete  HRI or Home Care Consult  Complete  Social Work Consult for Recovery Care Planning/Counseling  Complete  Palliative Care Screening  Not Applicable  Medication Review [employer])  Complete

## 2024-07-19 NOTE — Progress Notes (Signed)
 Pt requested to stay on ATC at this time, says she will notify staff later tonight when she is ready to be put on the ventilator.

## 2024-07-20 ENCOUNTER — Inpatient Hospital Stay (HOSPITAL_COMMUNITY)

## 2024-07-20 ENCOUNTER — Telehealth: Payer: Self-pay | Admitting: Physician Assistant

## 2024-07-20 DIAGNOSIS — B9561 Methicillin susceptible Staphylococcus aureus infection as the cause of diseases classified elsewhere: Secondary | ICD-10-CM

## 2024-07-20 LAB — BASIC METABOLIC PANEL WITH GFR
Anion gap: 10 (ref 5–15)
BUN: 32 mg/dL — ABNORMAL HIGH (ref 6–20)
CO2: 36 mmol/L — ABNORMAL HIGH (ref 22–32)
Calcium: 9.4 mg/dL (ref 8.9–10.3)
Chloride: 91 mmol/L — ABNORMAL LOW (ref 98–111)
Creatinine, Ser: 1.36 mg/dL — ABNORMAL HIGH (ref 0.44–1.00)
GFR, Estimated: 45 mL/min — ABNORMAL LOW
Glucose, Bld: 95 mg/dL (ref 70–99)
Potassium: 3.7 mmol/L (ref 3.5–5.1)
Sodium: 136 mmol/L (ref 135–145)

## 2024-07-20 LAB — CBC
HCT: 46.1 % — ABNORMAL HIGH (ref 36.0–46.0)
Hemoglobin: 14.2 g/dL (ref 12.0–15.0)
MCH: 28.7 pg (ref 26.0–34.0)
MCHC: 30.8 g/dL (ref 30.0–36.0)
MCV: 93.3 fL (ref 80.0–100.0)
Platelets: 195 K/uL (ref 150–400)
RBC: 4.94 MIL/uL (ref 3.87–5.11)
RDW: 16 % — ABNORMAL HIGH (ref 11.5–15.5)
WBC: 5.6 K/uL (ref 4.0–10.5)
nRBC: 0 % (ref 0.0–0.2)

## 2024-07-20 LAB — GLUCOSE, CAPILLARY
Glucose-Capillary: 115 mg/dL — ABNORMAL HIGH (ref 70–99)
Glucose-Capillary: 89 mg/dL (ref 70–99)

## 2024-07-20 MED ORDER — POLYETHYLENE GLYCOL 3350 17 G PO PACK: 17.0000 g | PACK | Freq: Every day | ORAL | 0 refills | Status: AC | PRN

## 2024-07-20 MED ORDER — LEVOFLOXACIN 750 MG PO TABS: 750.0000 mg | ORAL_TABLET | Freq: Every day | ORAL | 0 refills | Status: AC

## 2024-07-20 MED ORDER — FUROSEMIDE 40 MG PO TABS: 40.0000 mg | ORAL_TABLET | Freq: Two times a day (BID) | ORAL | 1 refills | Status: AC

## 2024-07-20 MED ORDER — PANTOPRAZOLE SODIUM 40 MG PO TBEC: 40.0000 mg | DELAYED_RELEASE_TABLET | Freq: Every day | ORAL | 0 refills | Status: AC

## 2024-07-20 NOTE — Telephone Encounter (Signed)
 Patient called and scheduled.

## 2024-07-20 NOTE — Discharge Summary (Signed)
 Physician Discharge Summary         Patient ID: Kameelah Minish MRN: [id number] DOB/AGE: [birth year] 59 y.o.  Admit date: 07/15/2024 Discharge date: 07/20/2024  Discharge Diagnoses:    Active Hospital Problems   Diagnosis Date Noted   Acute on chronic hypoxic respiratory failure (HCC) 07/15/2024   Tracheitis 07/19/2024   Pneumonia of both lungs due to Pseudomonas species (HCC) 07/18/2024   Acute diastolic heart failure (HCC) 01/05/2022    Resolved Hospital Problems  No resolved problems to display.      Discharge summary   This is a 59 year old female patient who lives at home independently.  Independent in ADLs at baseline.  She is tracheostomy dependent with chronic hypoxic and hypercarbic respiratory failure in the setting of severe OHS and OSA complicated by type III pH, diastolic dysfunction, and prior decompensated cor pulmonale.  She is nocturnally ventilator dependent.  She presented on 1/2 for planned routine tracheostomy change at the tracheostomy clinic.  Upon arrival reporting she was feeling very poorly, specifically very short of breath, afraid to go to sleep in fear she might not wake up.  She had been seen In the emergency room on the 29th with these complaints, portable chest x-ray showed diffuse pulmonary edema, she was given intravenous furosemide , and instructed to double her home diuretic, later she was discharged to home from the ER.   On further questioning she began to have increased shortness of breath over the last 4 to 5 weeks prior to presentation.  Also had been having difficulty tolerating her nocturnal ventilation and over the last 2 weeks prior to presentation not using it at all.     After leaving the emergency room she continued to take her Lasix  at increased dose recommendation, presented to tracheostomy clinic reporting only marginal improvement but just still not returning to baseline.  Pulse oximetry was placed noting room air pulse oximetry at  59%, she denied fever, significant cough, chest pain, she has not had any sick exposure.  She is a had a hard time sleeping and has not been able to sleep due to shortness of breath.  She was placed on supplemental oxygen  at 4 L which brought her pulse oximetry up to 88% she was subsequently directly admitted to the intensive care unit.   She was treated for Proteus and Pseudomonas pneumonia and required daytime oxygen  supplementation which was ordered for home use.  Her Lasix  was also increased with good diuresis.  She was discharged home and instructed to follow-up closely with her primary care provider and the trach clinic as scheduled    Discharge Plan by Active Problems     Acute on chronic hypoxic respiratory failure secondary to Pseudomonas and Proteus PNA and acute pulmonary edema  Acute on chronic heart failure, and decompensated cor pulmonale Baseline obesity hypoventilation syndrome and obstructive sleep apnea  Plan Trach aspirate growing Pseudomonas and Proteus  Echocardiogram -shows EF is 55 to 60% normal LV function. Supplemental home O2 ordered Continue nocturnal vent Lasix  increased to 40mg  bid, pt encouraged close follow up, have sent a request through Epic for outpatient pulmonary f/u Treated with two days of Zosyn , discharged with one week of Levaquin      Tracheostomy dependent Plan Tracheostomy changed 1/2 Follow-up in tracheostomy clinic in 4 weeks postdischarge   Acute on chronic diastolic heart failure, with cor pulmonale Plan Avoid hypoxia Diuresis as above  Continue her home Norvasc  Resume Farxiga     History of PE and DVT Plan Continue  apixaban    Hyperlipidemia Plan Continue statin    AKI Creatinine bump from <1 to 1.25-1.35 Good UOP and no hyperkalemia Possibly from mild cardiorenal syndrome vs diuresis, discussed close follow up with patient       Significant Hospital tests/ studies  1/2 Echo  1. Extremely limited views due to body  habitus even with contrast  imaging. EF appears roughly normal. Left ventricular ejection fraction, by  estimation, is 55 to 60%. The left ventricle has normal function. Left  ventricular endocardial border not  optimally defined to evaluate regional wall motion. Left ventricular  diastolic function could not be evaluated.   2. RV not well visualized but suspect at least mildly reduced, mildly  enlarged. Right ventricular systolic function was not well visualized. The  right ventricular size is not well visualized.   3. The mitral valve is grossly normal. No evidence of mitral valve  regurgitation.   4. The aortic valve is tricuspid. Aortic valve regurgitation is not  visualized. Aortic valve sclerosis is present, with no evidence of aortic  valve stenosis.   Procedures   None   Culture data/antimicrobials     Tracheal Aspirate 1/2  Susceptibility  Proteus mirabilis (ZZ00)  Antibiotic Interpretation Microscan Method Status   AMPICILLIN  Resistant RESISTANT MIC Final   CEFAZOLIN  (NON-URINE) Intermediate 4 INTERMEDIATE MIC Final   CEFEPIME  Sensitive 0.5 SENSITIVE MIC Final   ERTAPENEM Sensitive <=0.12 SENSITIVE MIC Final   CEFTRIAXONE  Sensitive <=0.25 SENSITIVE MIC Final   CIPROFLOXACIN Sensitive <=0.06 SENSITIVE MIC Final   GENTAMICIN Sensitive <=1 SENSITIVE MIC Final   MEROPENEM Sensitive 1 SENSITIVE MIC Final   TRIMETH /SULFA  Sensitive <=20 SENSITIVE MIC Final   AMPICILLIN /SULBACTAM Sensitive <=2 SENSITIVE MIC Final   PIP/TAZO Sensitive  MIC Final    <=4 SENSITIVE This is a modified FDA-approved test that has been validated and its performance characteristics determined by the reporting laboratory.  This laboratory is certified under the Clinical Laboratory Improvement Amendments CLIA as qualified to perform high complexity clinical laboratory testing.      Susceptibility Comments  FEW PROTEUS MIRABILIS   Pseudomonas aeruginosa (ZZ01)  Antibiotic Interpretation  Microscan Method Status   MEROPENEM Sensitive 1 SENSITIVE MIC Final   CIPROFLOXACIN Sensitive 0.25 SENSITIVE MIC Final   IMIPENEM Sensitive 2 SENSITIVE MIC Final   CEFTAZIDIME/AVIBACTAM Sensitive 2 SENSITIVE MIC Final   CEFTOLOZANE/TAZOBACTAM Sensitive 1 SENSITIVE MIC Final   TOBRAMYCIN Sensitive <=1 SENSITIVE MIC Final    Susceptibility Comments  FEW PSEUDOMONAS AERUGINOSA       Consults      Discharge Exam: BP 113/79   Pulse 81   Temp 97.8 F (36.6 C) (Oral)   Resp (!) 22   Ht 5' 7 (1.702 m)   Wt (!) 176.4 kg   LMP 11/29/2015   SpO2 90%   BMI 60.91 kg/m    Labs at discharge   Lab Results  Component Value Date   CREATININE 1.36 (H) 07/20/2024   BUN 32 (H) 07/20/2024   NA 136 07/20/2024   K 3.7 07/20/2024   CL 91 (L) 07/20/2024   CO2 36 (H) 07/20/2024   Lab Results  Component Value Date   WBC 5.6 07/20/2024   HGB 14.2 07/20/2024   HCT 46.1 (H) 07/20/2024   MCV 93.3 07/20/2024   PLT 195 07/20/2024   Lab Results  Component Value Date   ALT <5 07/15/2024   AST 20 07/15/2024   ALKPHOS 95 07/15/2024   BILITOT 1.5 (H) 07/15/2024   Lab Results  Component Value Date   INR 1.1 10/22/2023   INR 2.6 10/07/2016   INR 2.5 09/09/2016    Current radiological studies    DG CHEST PORT 1 VIEW Result Date: 07/20/2024 EXAM: 1 VIEW(S) XRAY OF THE CHEST 07/20/2024 05:25:00 AM COMPARISON: Portable chest 07/18/2024. CLINICAL HISTORY: Hypoxia 200808. FINDINGS: LINES, TUBES AND DEVICES: Stable tracheostomy cannula. LUNGS AND PLEURA: There is interval increased perihilar vascular congestion without overt edema. Patchy perihilar airspace disease on the left greater than right is also increased in the setting of low lung volumes. Small pleural effusions are beginning to develop. No pneumothorax. HEART AND MEDIASTINUM: Cardiomegaly is unchanged. The mediastinum is stable. BONES AND SOFT TISSUES: Thoracic spondylosis. IMPRESSION: 1. Patchy perihilar airspace disease on the left  greater than right, increased in the setting of low lung volumes. 2. Interval increased perihilar vascular congestion without overt edema. 3. Small pleural effusions beginning to develop. Electronically signed by: Francis Quam MD 07/20/2024 07:44 AM EST RP Workstation: [id number]    Disposition:     Home with follow up with pulmonology and PCP   Allergies as of 07/20/2024       Reactions   Chlorhexidine  Gluconate Itching, Rash        Medication List     TAKE these medications    amLODipine  5 MG tablet Commonly known as: NORVASC  Take 1 tablet (5 mg total) by mouth daily.   apixaban  5 MG Tabs tablet Commonly known as: ELIQUIS  Take 1 tablet (5 mg total) by mouth 2 (two) times daily.   atorvastatin  20 MG tablet Commonly known as: LIPITOR Take 1 tablet (20 mg total) by mouth daily.   dapagliflozin  propanediol 10 MG Tabs tablet Commonly known as: FARXIGA  Take 1 tablet (10 mg total) by mouth daily.   furosemide  40 MG tablet Commonly known as: Lasix  Take 1 tablet (40 mg total) by mouth 2 (two) times daily. What changed:  medication strength when to take this   levofloxacin  750 MG tablet Commonly known as: LEVAQUIN  Take 1 tablet (750 mg total) by mouth daily. Start taking on: July 21, 2024   pantoprazole  40 MG tablet Commonly known as: PROTONIX  Take 1 tablet (40 mg total) by mouth at bedtime.   polyethylene glycol 17 g packet Commonly known as: MIRALAX  / GLYCOLAX  Take 17 g by mouth daily as needed for moderate constipation.   spironolactone  25 MG tablet Commonly known as: ALDACTONE  Take 1 tablet (25 mg total) by mouth daily.   UNABLE TO FIND 1 Device by Does not apply route at bedtime. CPAP, every night.               Durable Medical Equipment  (From admission, onward)           Start     Ordered   07/20/24 1056  For home use only DME oxygen   Once       Question Answer Comment  Length of Need 12 Months   Mode or (Route) Mask   Liters per  Minute 10   Frequency Continuous (stationary and portable oxygen  unit needed)   Oxygen  conserving device Yes   Oxygen  delivery system: Gas   Oxygen  delivery system: Concentrator      07/20/24 1056             Follow-up appointment   Requested pulmonology f/u through Epic   Discharge Condition:    good   Leita SAUNDERS Zackry Deines, PA-C [facility] See Amion for pager If no response to  pager , please call [phone] until 7pm

## 2024-07-20 NOTE — TOC Transition Note (Signed)
 Transition of Care [facility]) - Discharge Note   Patient Details  Name: Angel Brewer MRN: [id number] Date of Birth: 02-07-1966  [facility] CM/SW Contact:  Sudie Erminio Deems, RN Phone Number: 07/20/2024, 4:04 PM   Clinical Narrative: Patient was discharged home today. In need of increased oxygen  liter flow 10 liters. Patient currently has a 5 liter concentrator in the home. [employer] is aware of new orders to deliver concentrator to the home- was delivered at 1543. No further needs identified at this time.    Barriers to Discharge: No Barriers Identified   Patient Goals and CMS Choice Patient states their goals for this hospitalization and ongoing recovery are:: to return home CMS Medicare.gov Compare Post Acute Care list provided to:: Patient  Discharge Plan and Services Additional resources added to the After Visit Summary for     Discharge Planning Services: CM Consult Post Acute Care Choice: Durable Medical Equipment          DME Arranged: Oxygen  DME Agency: Other - Comment Date DME Agency Contacted: 07/19/24 Time DME Agency Contacted: [phone] Representative spoke with at DME Agency: Zack with [employer]   Social Drivers of Health (SDOH) Interventions SDOH Screenings   Food Insecurity: No Food Insecurity (07/19/2024)  Housing: Low Risk (07/19/2024)  Transportation Needs: No Transportation Needs (07/19/2024)  Utilities: Not At Risk (07/19/2024)  Depression (PHQ2-9): Low Risk (06/17/2023)  Financial Resource Strain: Low Risk (01/19/2024)  Physical Activity: Insufficiently Active (01/19/2024)  Social Connections: Socially Isolated (01/19/2024)  Stress: No Stress Concern Present (01/19/2024)  Tobacco Use: Medium Risk (07/19/2024)  Health Literacy: Adequate Health Literacy (06/17/2023)     Readmission Risk Interventions    09/03/2023    1:17 PM 12/20/2021   11:33 AM  Readmission Risk Prevention Plan  Post Dischage Appt Complete   Medication Screening Complete    Transportation Screening Complete Complete  PCP or Specialist Appt within 3-5 Days  Complete  HRI or Home Care Consult  Complete  Social Work Consult for Recovery Care Planning/Counseling  Complete  Palliative Care Screening  Not Applicable  Medication Review Oceanographer)  Complete

## 2024-07-20 NOTE — Progress Notes (Addendum)
 SATURATION QUALIFICATIONS: (This note is used to comply with regulatory documentation for home oxygen )  Patient Saturations on Room Air at Rest = 85%  Patient Saturations on Room Air while Ambulating = 77%  Patient Saturations on 10 Liters of oxygen  while Ambulating = 92%  Please briefly explain why patient needs home oxygen :   Established tracheostomy patient previously on room air during the day and using ventilator only at night.  Now symptomatic, dyspneic, and unable to maintain adequate oxygen  saturation without supplemental oxygen  even at rest and especially with ambulation.

## 2024-07-20 NOTE — Plan of Care (Signed)

## 2024-07-21 ENCOUNTER — Telehealth: Payer: Self-pay | Admitting: *Deleted

## 2024-07-21 NOTE — Transitions of Care (Post Inpatient/ED Visit) (Signed)
 "  07/21/2024  Name: Angel Brewer MRN: [id number] DOB: Nov 18, 1965  Today's TOC FU Call Status: Today's TOC FU Call Status:: Successful TOC FU Call Completed TOC FU Call Complete Date: 07/21/24  Patient's Name and Date of Birth confirmed. Name, DOB  Transition Care Management Follow-up Telephone Call Date of Discharge: 07/20/24 Discharge Facility: [facility]) Type of Discharge: Inpatient Admission Primary Inpatient Discharge Diagnosis:: Acute on chronic hypoxic respiratory failure How have you been since you were released from the hospital?: Better Any questions or concerns?: No  Items Reviewed: Did you receive and understand the discharge instructions provided?: Yes Medications obtained,verified, and reconciled?: Yes (Medications Reviewed) Any new allergies since your discharge?: No Dietary orders reviewed?: Yes Type of Diet Ordered:: Heart Healthy Do you have support at home?: No (Patient has a supportive family)  Medications Reviewed Today: Medications Reviewed Today     Reviewed by Lucky Andrea LABOR, RN (Registered Nurse) on 07/21/24 at [phone]  Med List Status: <None>   Medication Order Taking? Sig Documenting Provider Last Dose Status Informant  amLODipine  (NORVASC ) 5 MG tablet [id number] Yes Take 1 tablet (5 mg total) by mouth daily. Theotis Haze ORN, NP  Active Self, Pharmacy Records  apixaban  (ELIQUIS ) 5 MG TABS tablet [id number] Yes Take 1 tablet (5 mg total) by mouth 2 (two) times daily. Theotis Haze ORN, NP  Active Pharmacy Records, Self  atorvastatin  (LIPITOR) 20 MG tablet [id number] Yes Take 1 tablet (20 mg total) by mouth daily. Theotis Haze ORN, NP  Active Self, Pharmacy Records  dapagliflozin  propanediol (FARXIGA ) 10 MG TABS tablet [id number] Yes Take 1 tablet (10 mg total) by mouth daily. Theotis Haze ORN, NP  Active Self, Pharmacy Records  furosemide  (LASIX ) 40 MG tablet [id number] Yes Take 1 tablet (40 mg total) by mouth 2 (two) times daily. Gleason, Laura R,  PA-C  Active   levofloxacin  (LEVAQUIN ) 750 MG tablet [id number] Yes Take 1 tablet (750 mg total) by mouth daily. Gleason, Leita JONELLE RIGGERS  Active   OXYGEN  [id number] Yes Inhale 3 L into the lungs continuous. [provider]  Active   pantoprazole  (PROTONIX ) 40 MG tablet [id number] Yes Take 1 tablet (40 mg total) by mouth at bedtime. Gleason, Leita JONELLE, PA-C  Active   polyethylene glycol (MIRALAX  / GLYCOLAX ) 17 g packet [id number]  Take 17 g by mouth daily as needed for moderate constipation.  Patient not taking: Reported on 07/21/2024   Gleason, Leita JONELLE, PA-C  Active   spironolactone  (ALDACTONE ) 25 MG tablet [id number] Yes Take 1 tablet (25 mg total) by mouth daily. Theotis Haze ORN, NP  Active Self, Pharmacy Records  UNABLE TO FIND [id number] Yes 1 Device by Does not apply route at bedtime. CPAP, every night. [provider]  Active Self, Pharmacy Records            Home Care and Equipment/Supplies: Were Home Health Services Ordered?: NA Any new equipment or medical supplies ordered?: Yes Name of Medical supply agency?: [employer] for larger oxygen  tank Were you able to get the equipment/medical supplies?: Yes Do you have any questions related to the use of the equipment/supplies?: No  Functional Questionnaire: Do you need assistance with bathing/showering or dressing?: No Do you need assistance with meal preparation?: No Do you need assistance with eating?: No Do you have difficulty maintaining continence: No Do you need assistance with getting out of bed/getting out of a chair/moving?: No Do you have difficulty managing or taking your medications?: No  Follow up appointments reviewed: PCP  Follow-up appointment confirmed?: No (RNCM assisted with scheduling) MD Provider Line Number:[phone] Given: No Specialist Hospital Follow-up appointment confirmed?: Yes Date of Specialist follow-up appointment?: 08/04/24 Follow-Up Specialty Provider:: Pulmonology Do you need  transportation to your follow-up appointment?: No Do you understand care options if your condition(s) worsen?: Yes-patient verbalized understanding    Andrea Dimes RN, BSN [place]  [facility] RN Care Manager [phone]  "

## 2024-07-22 ENCOUNTER — Ambulatory Visit: Admitting: Nurse Practitioner

## 2024-08-04 ENCOUNTER — Inpatient Hospital Stay: Admitting: Pulmonary Disease

## 2024-08-04 DIAGNOSIS — G4733 Obstructive sleep apnea (adult) (pediatric): Secondary | ICD-10-CM

## 2024-08-11 ENCOUNTER — Other Ambulatory Visit: Payer: Self-pay | Admitting: Acute Care

## 2024-08-11 NOTE — Progress Notes (Signed)
 Tracheostomy Home Health Order Sheet  Angel Brewer Tracheostomy Clinic: [facility] Respiratory Care Department [address]  [place] [address]  [phone]  Fax [phone]  (Fax order requests to this number)   Angel Brewer  Feb 14, 1966  [id number]   Please order the following    Shiley , size 6, and Cuffed With # 60 inner cannulas/ refill q 12     Supplies:     tube holder/collar. Quantity: 8 refills12, 4X4 gauze drain dressings. Quantity: 30 refills12, Passy Muir Valve (speaking valve) Quantity2 refills12, and tracheostomy care/cleaning kits Quantity 20 refills12   Additional supplies    Medication instructions.      Maude Banner ACNP NPI# [id number] [facility]  [address] [place], [place] [address]   08/11/24   @SIGNATUREIMG @    _______________________________________________.

## 2024-08-15 ENCOUNTER — Ambulatory Visit: Payer: Self-pay | Admitting: Nurse Practitioner

## 2024-08-15 ENCOUNTER — Telehealth: Payer: Self-pay | Admitting: Nurse Practitioner

## 2024-08-15 NOTE — Telephone Encounter (Signed)
 Contacted pt phone cannot be completed as dial/not in service office closed due to the weather (please resch appt if pt calls back

## 2024-08-16 ENCOUNTER — Inpatient Hospital Stay: Admitting: Pulmonary Disease

## 2024-08-31 ENCOUNTER — Inpatient Hospital Stay: Admitting: Pulmonary Disease
